# Patient Record
Sex: Male | Born: 1975 | Race: Black or African American | Hispanic: No | Marital: Single | Smoking: Current every day smoker
Health system: Southern US, Community
[De-identification: ages and names within clinical notes are randomized; demographics above are authoritative.]

## PROBLEM LIST (undated history)

## (undated) DIAGNOSIS — M109 Gout, unspecified: Secondary | ICD-10-CM

## (undated) DIAGNOSIS — E669 Obesity, unspecified: Secondary | ICD-10-CM

## (undated) DIAGNOSIS — I10 Essential (primary) hypertension: Secondary | ICD-10-CM

## (undated) DIAGNOSIS — E785 Hyperlipidemia, unspecified: Secondary | ICD-10-CM

## (undated) DIAGNOSIS — E119 Type 2 diabetes mellitus without complications: Secondary | ICD-10-CM

## (undated) DIAGNOSIS — J45909 Unspecified asthma, uncomplicated: Secondary | ICD-10-CM

## (undated) HISTORY — DX: Type 2 diabetes mellitus without complications: E11.9

## (undated) HISTORY — DX: Obesity, unspecified: E66.9

## (undated) HISTORY — DX: Essential (primary) hypertension: I10

## (undated) HISTORY — DX: Unspecified asthma, uncomplicated: J45.909

## (undated) HISTORY — PX: NO PAST SURGERIES: SHX2092

## (undated) HISTORY — DX: Hyperlipidemia, unspecified: E78.5

---

## 1998-10-07 ENCOUNTER — Emergency Department (HOSPITAL_COMMUNITY): Admission: EM | Admit: 1998-10-07 | Discharge: 1998-10-07 | Payer: Self-pay

## 2001-11-24 ENCOUNTER — Emergency Department (HOSPITAL_COMMUNITY): Admission: EM | Admit: 2001-11-24 | Discharge: 2001-11-24 | Payer: Self-pay | Admitting: Emergency Medicine

## 2001-11-24 ENCOUNTER — Encounter: Payer: Self-pay | Admitting: Emergency Medicine

## 2007-04-02 ENCOUNTER — Emergency Department (HOSPITAL_COMMUNITY): Admission: EM | Admit: 2007-04-02 | Discharge: 2007-04-02 | Payer: Self-pay | Admitting: Emergency Medicine

## 2007-04-02 IMAGING — CR DG HIP (WITH OR WITHOUT PELVIS) 2-3V*L*
3 series · 3 of 3 positions shown · non-contrast
Comparison: none

CLINICAL DATA: Left hip pain.  
 LEFT HIP ? 3 VIEW:

[t pelvis a.p.]
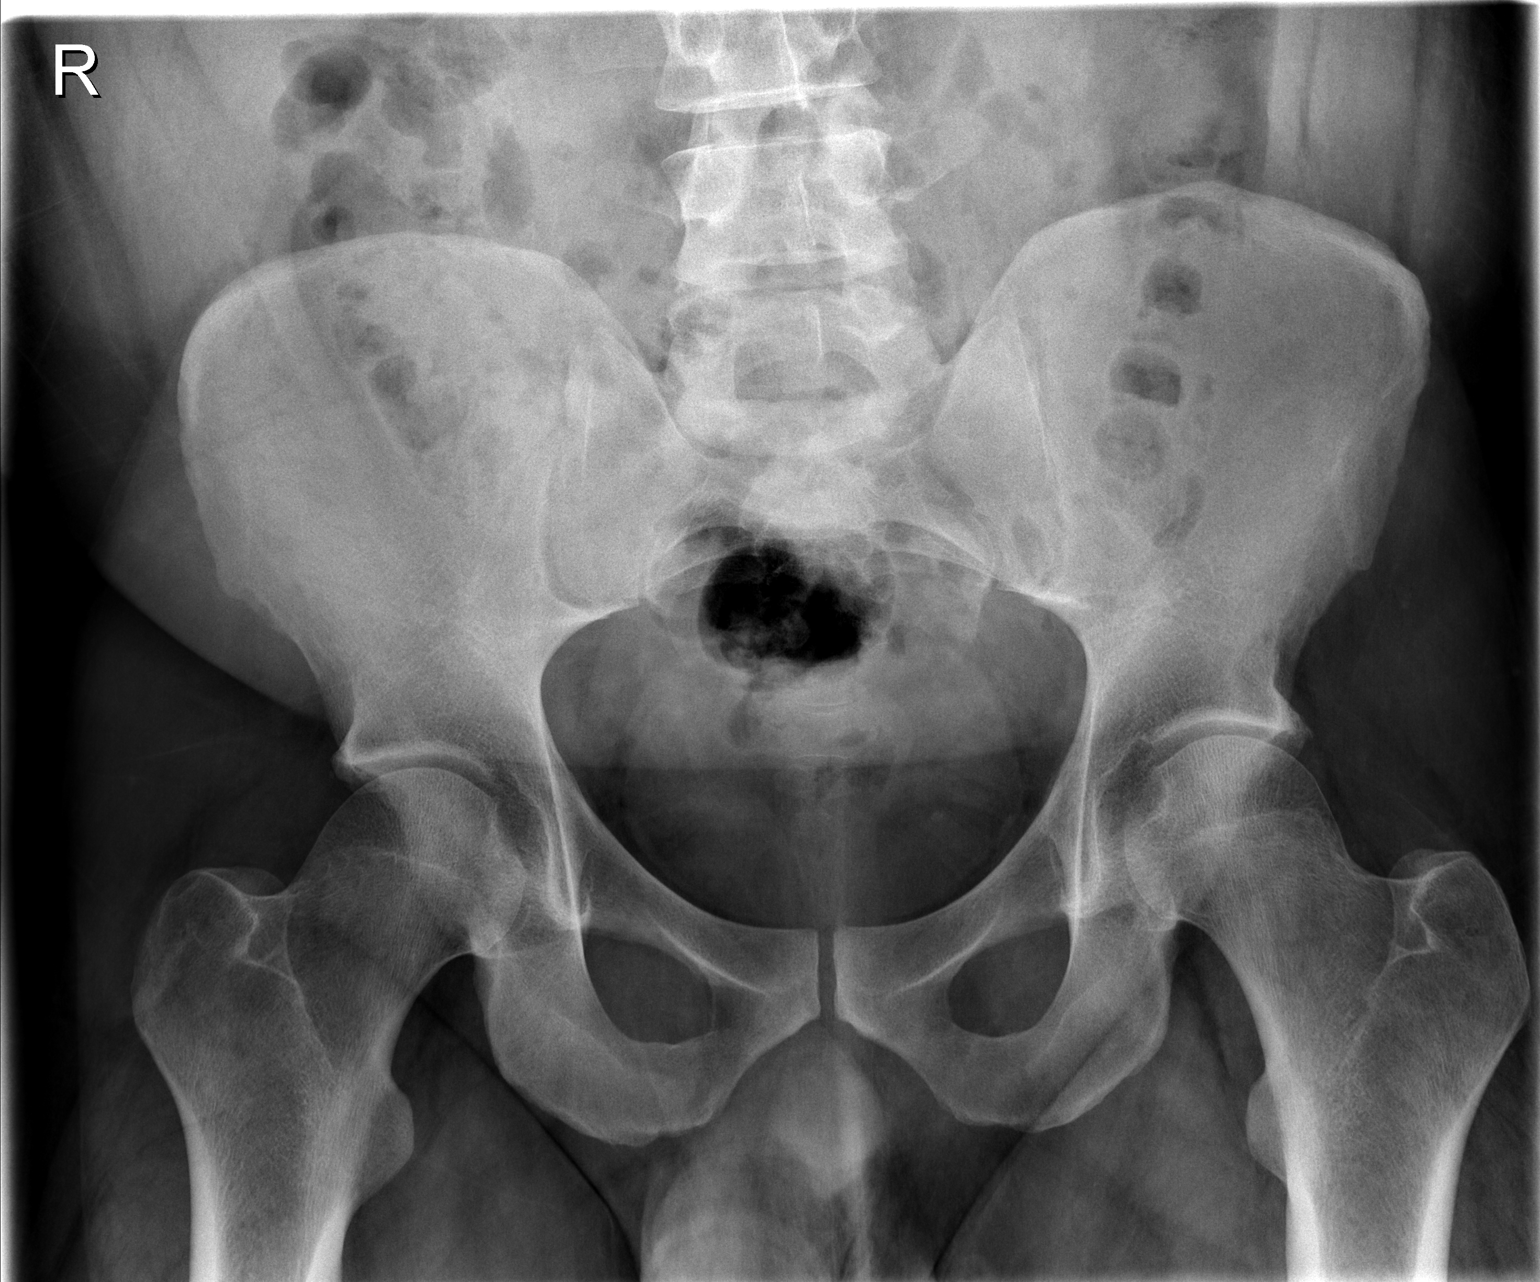

[t hip ap left]
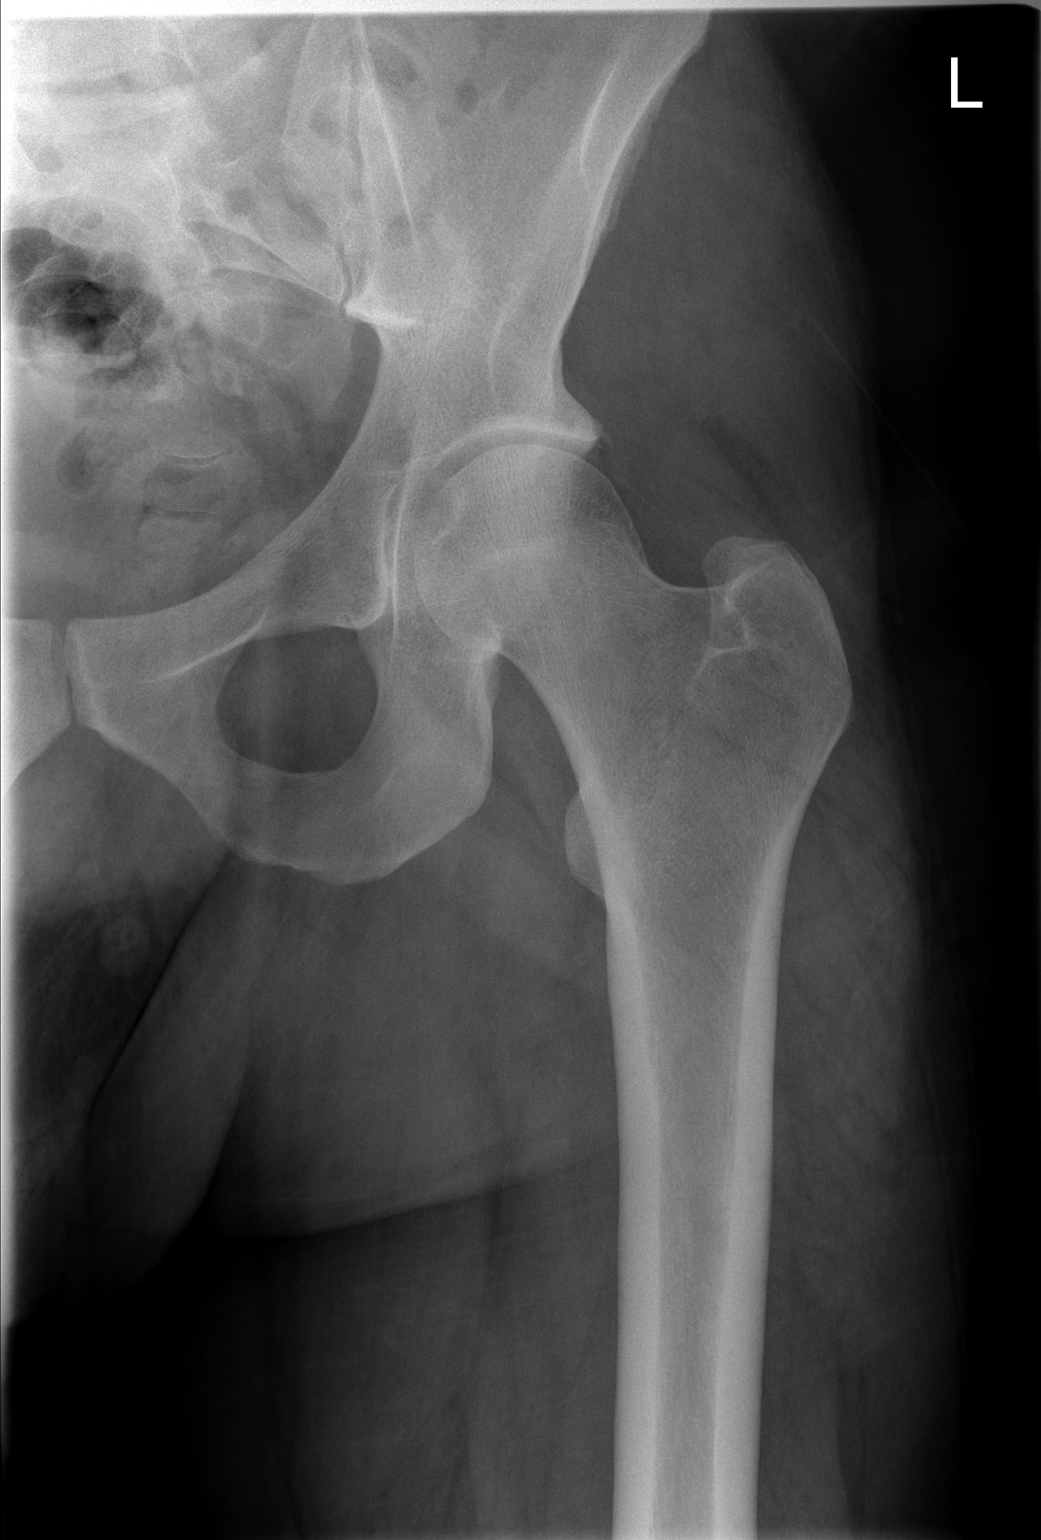

[t hip frog leg left]
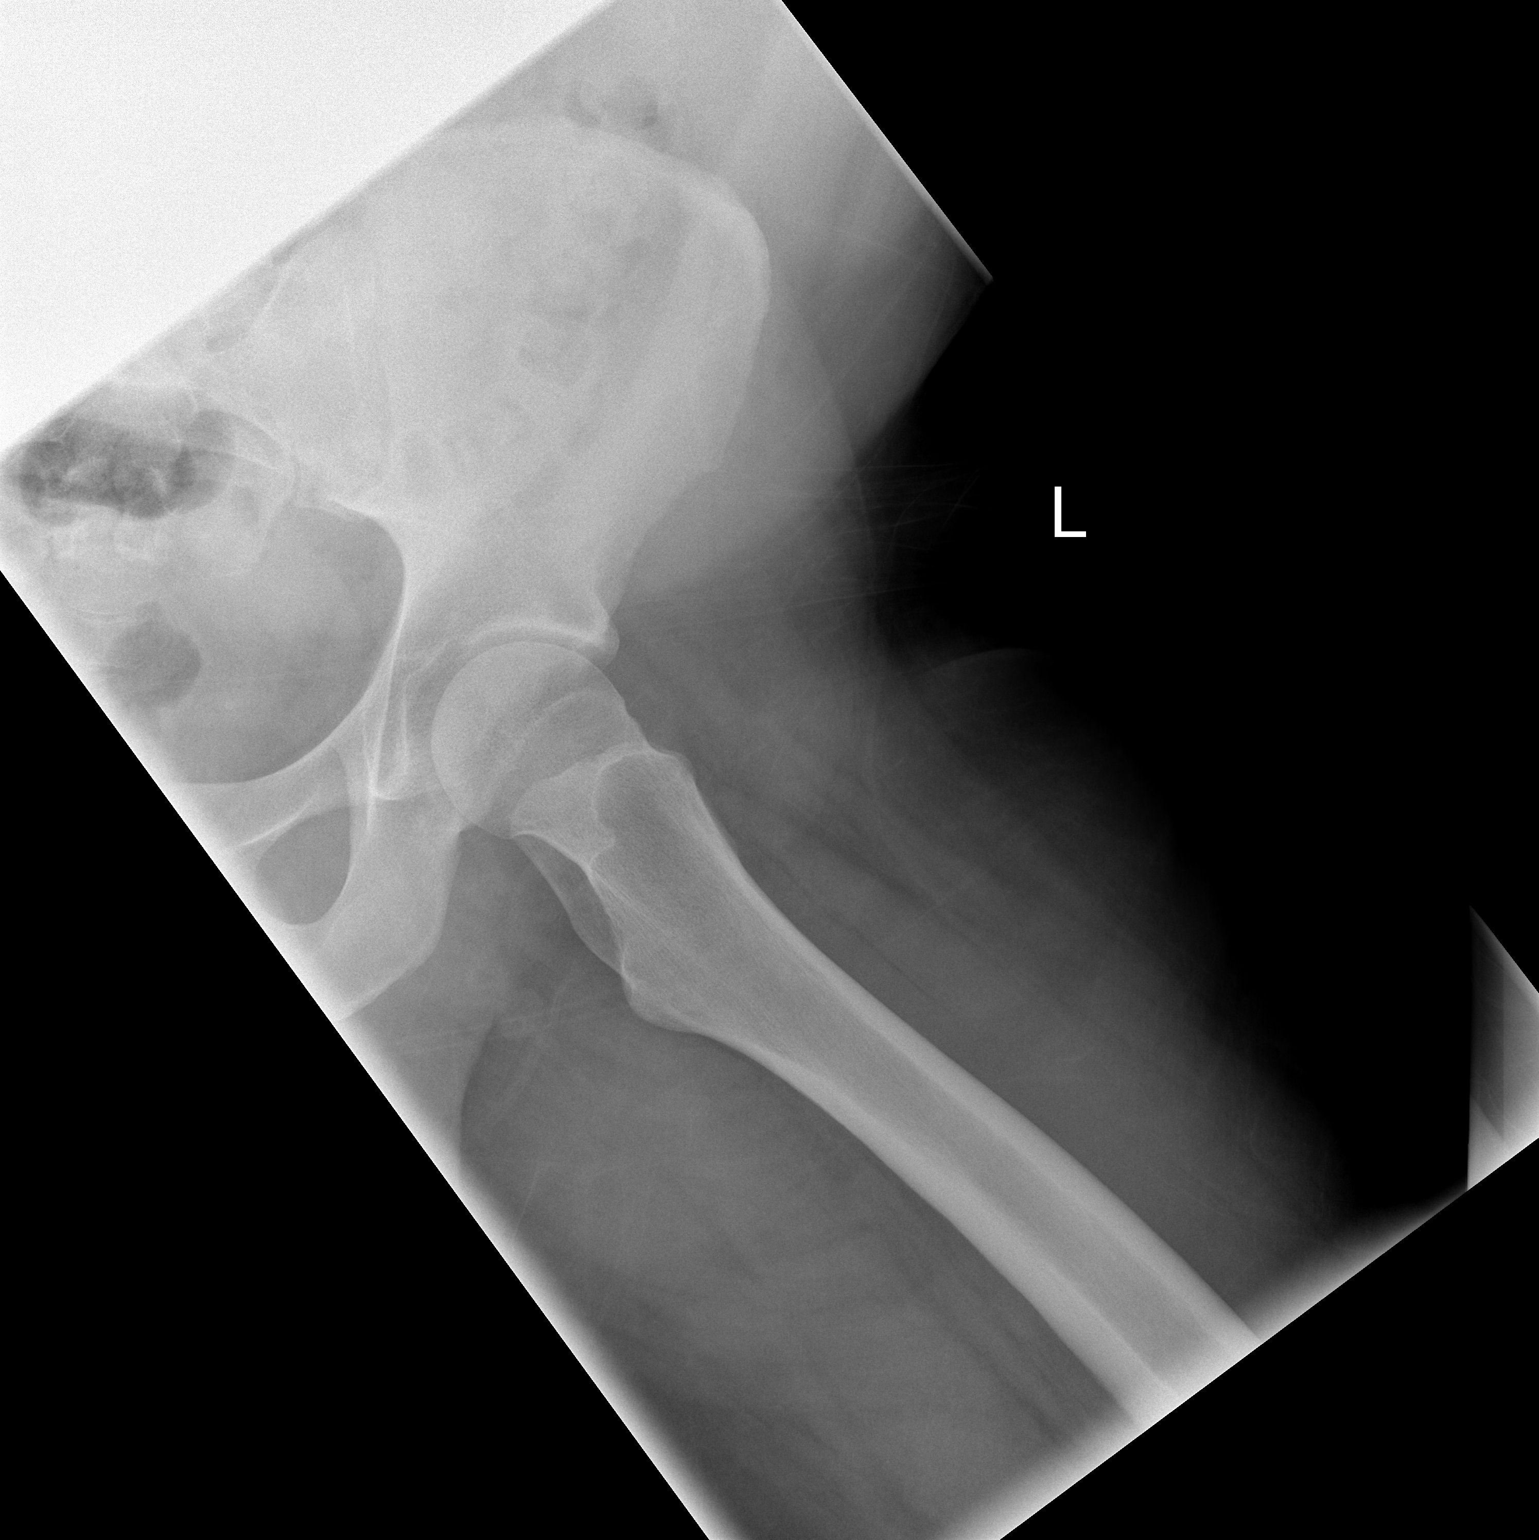

[3 of 3 positions shown; findings below may reference images not displayed]

FINDINGS: There is no evidence of hip fracture or dislocation.  There is no evidence of arthropathy or other focal bone abnormality involving the hip or pelvis.
IMPRESSION: Negative.

## 2008-01-16 ENCOUNTER — Emergency Department (HOSPITAL_COMMUNITY): Admission: EM | Admit: 2008-01-16 | Discharge: 2008-01-17 | Payer: Self-pay | Admitting: Emergency Medicine

## 2008-09-10 ENCOUNTER — Emergency Department (HOSPITAL_COMMUNITY): Admission: EM | Admit: 2008-09-10 | Discharge: 2008-09-10 | Payer: Self-pay | Admitting: Emergency Medicine

## 2008-09-10 IMAGING — CR DG CHEST 2V
2 series · 2 of 2 positions shown · non-contrast
Comparison: No priors

CLINICAL DATA: Chest pain

CHEST - 2 VIEW

[w chest pa]
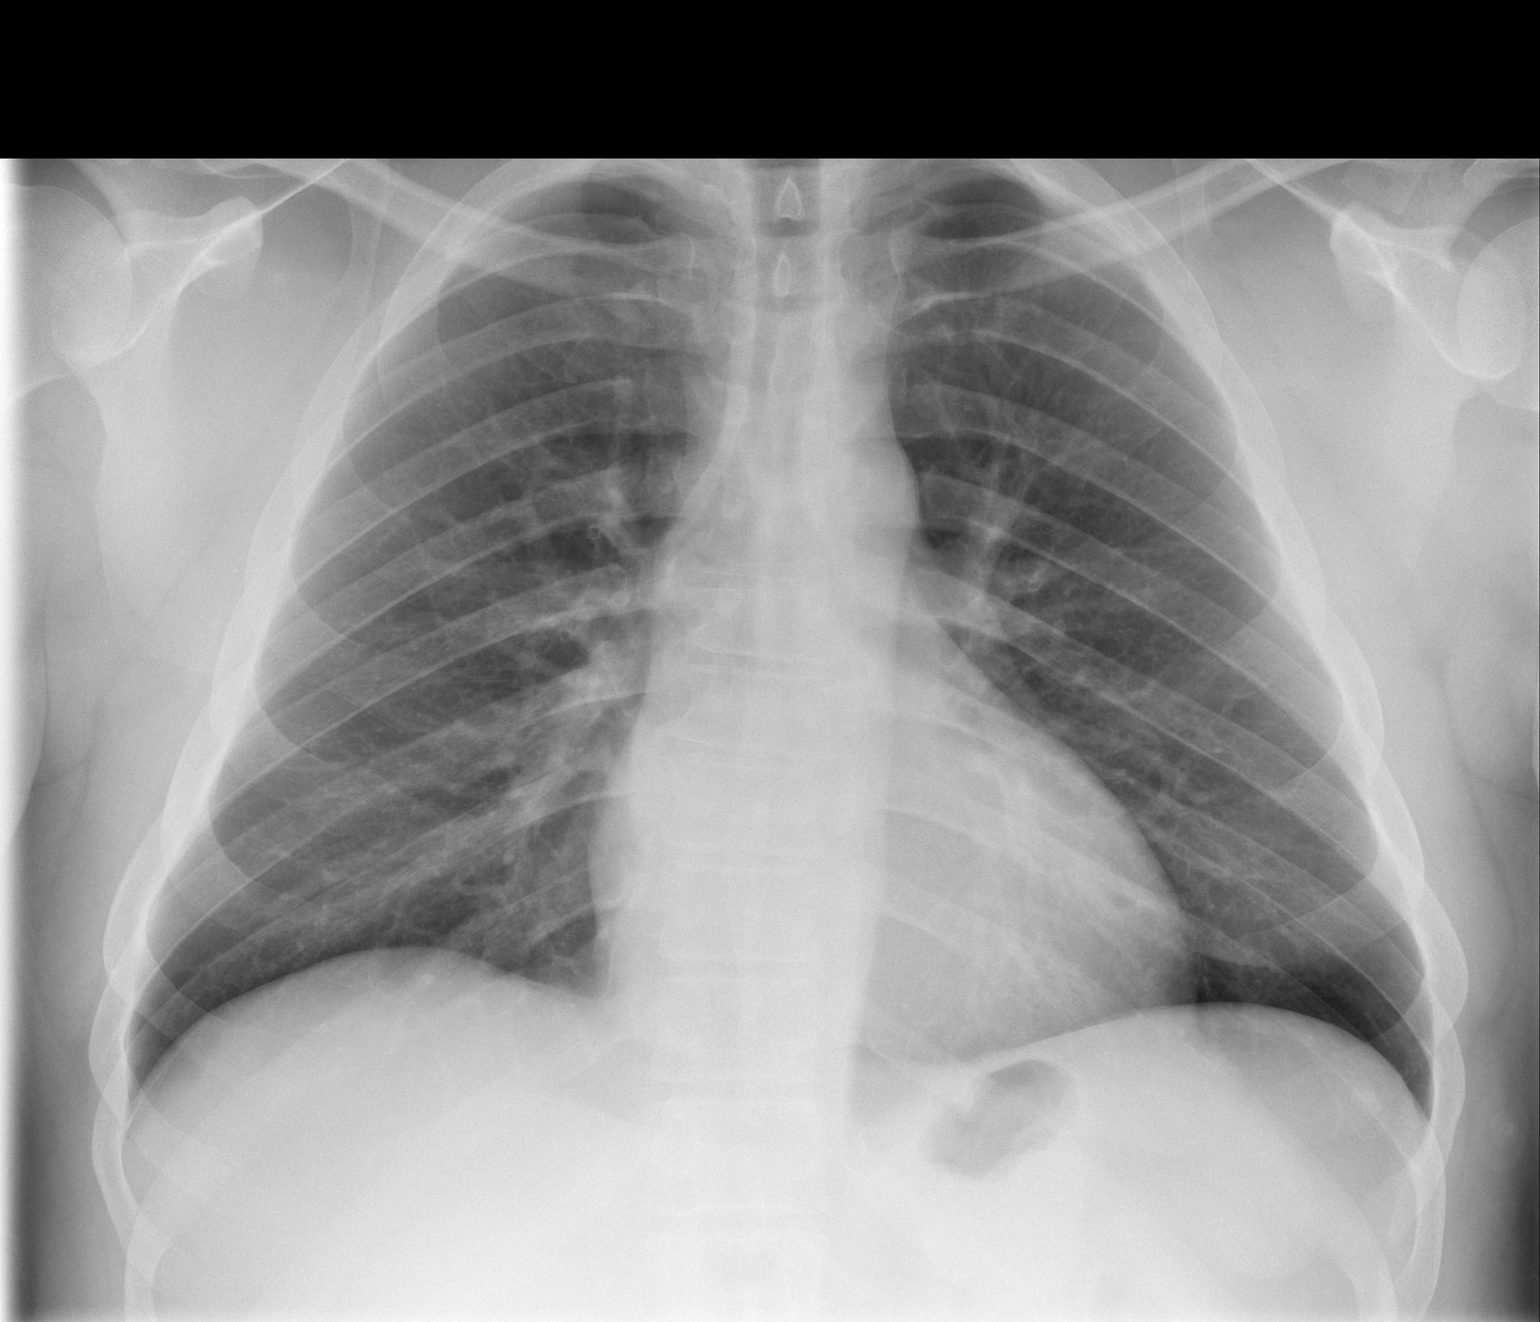

[w chest lat]
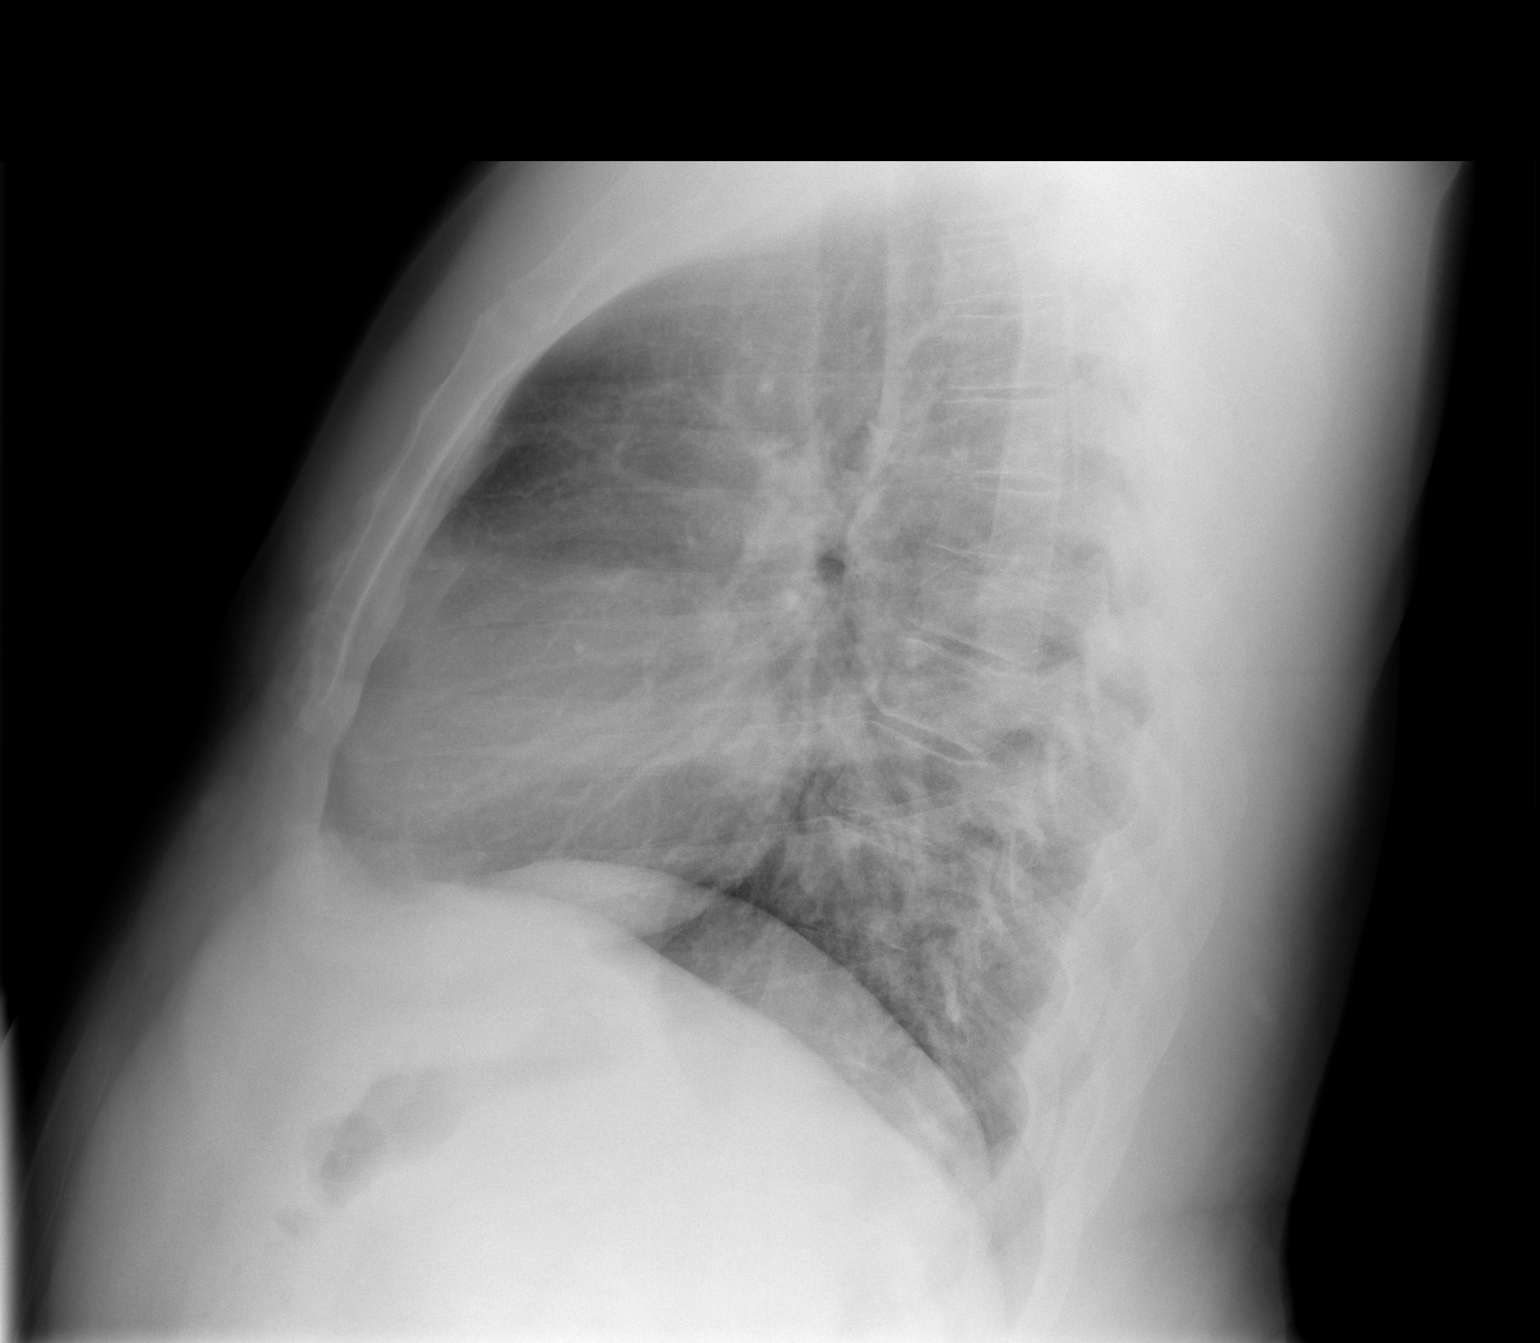

[2 of 2 positions shown; findings below may reference images not displayed]

FINDINGS: Heart and mediastinal contours normal.  Lungs clear.  No
pleural fluid.  Osseous structures and soft tissues unremarkable.
IMPRESSION: No acute or significant findings.

## 2010-12-20 LAB — POCT CARDIAC MARKERS: Myoglobin, poc: 105 ng/mL (ref 12–200)

## 2010-12-20 LAB — POCT I-STAT, CHEM 8
BUN: 14 mg/dL (ref 6–23)
Calcium, Ion: 1.16 mmol/L (ref 1.12–1.32)
HCT: 46 % (ref 39.0–52.0)
Hemoglobin: 15.6 g/dL (ref 13.0–17.0)
Sodium: 138 mEq/L (ref 135–145)
TCO2: 27 mmol/L (ref 0–100)

## 2010-12-20 LAB — DIFFERENTIAL
Basophils Absolute: 0.1 10*3/uL (ref 0.0–0.1)
Basophils Relative: 1 % (ref 0–1)
Eosinophils Absolute: 0.4 10*3/uL (ref 0.0–0.7)
Eosinophils Relative: 4 % (ref 0–5)
Lymphocytes Relative: 23 % (ref 12–46)
Lymphs Abs: 2.2 10*3/uL (ref 0.7–4.0)
Monocytes Absolute: 1.4 10*3/uL — ABNORMAL HIGH (ref 0.1–1.0)
Monocytes Relative: 14 % — ABNORMAL HIGH (ref 3–12)
Neutro Abs: 5.7 10*3/uL (ref 1.7–7.7)
Neutrophils Relative %: 59 % (ref 43–77)

## 2010-12-20 LAB — CBC
HCT: 43 % (ref 39.0–52.0)
Hemoglobin: 14.4 g/dL (ref 13.0–17.0)
MCHC: 33.5 g/dL (ref 30.0–36.0)
MCV: 89.1 fL (ref 78.0–100.0)
Platelets: 242 10*3/uL (ref 150–400)
RBC: 4.83 MIL/uL (ref 4.22–5.81)
RDW: 12.8 % (ref 11.5–15.5)
WBC: 9.7 10*3/uL (ref 4.0–10.5)

## 2011-01-13 ENCOUNTER — Ambulatory Visit
Admission: RE | Admit: 2011-01-13 | Discharge: 2011-01-13 | Disposition: A | Discharge status: Home / Self Care | Payer: Medicaid Other | Source: Ambulatory Visit | Attending: Family Medicine | Admitting: Family Medicine

## 2011-01-13 ENCOUNTER — Other Ambulatory Visit: Payer: Self-pay | Admitting: Family Medicine

## 2011-01-13 DIAGNOSIS — E669 Obesity, unspecified: Secondary | ICD-10-CM

## 2011-01-13 DIAGNOSIS — I10 Essential (primary) hypertension: Secondary | ICD-10-CM

## 2011-01-13 IMAGING — CR DG CHEST 2V
2 series · 2 of 2 positions shown · non-contrast
Comparison: Chest x-ray of [DATE]

CLINICAL DATA: Obesity, hypertension, smoking history

CHEST - 2 VIEW

[w chest pa]
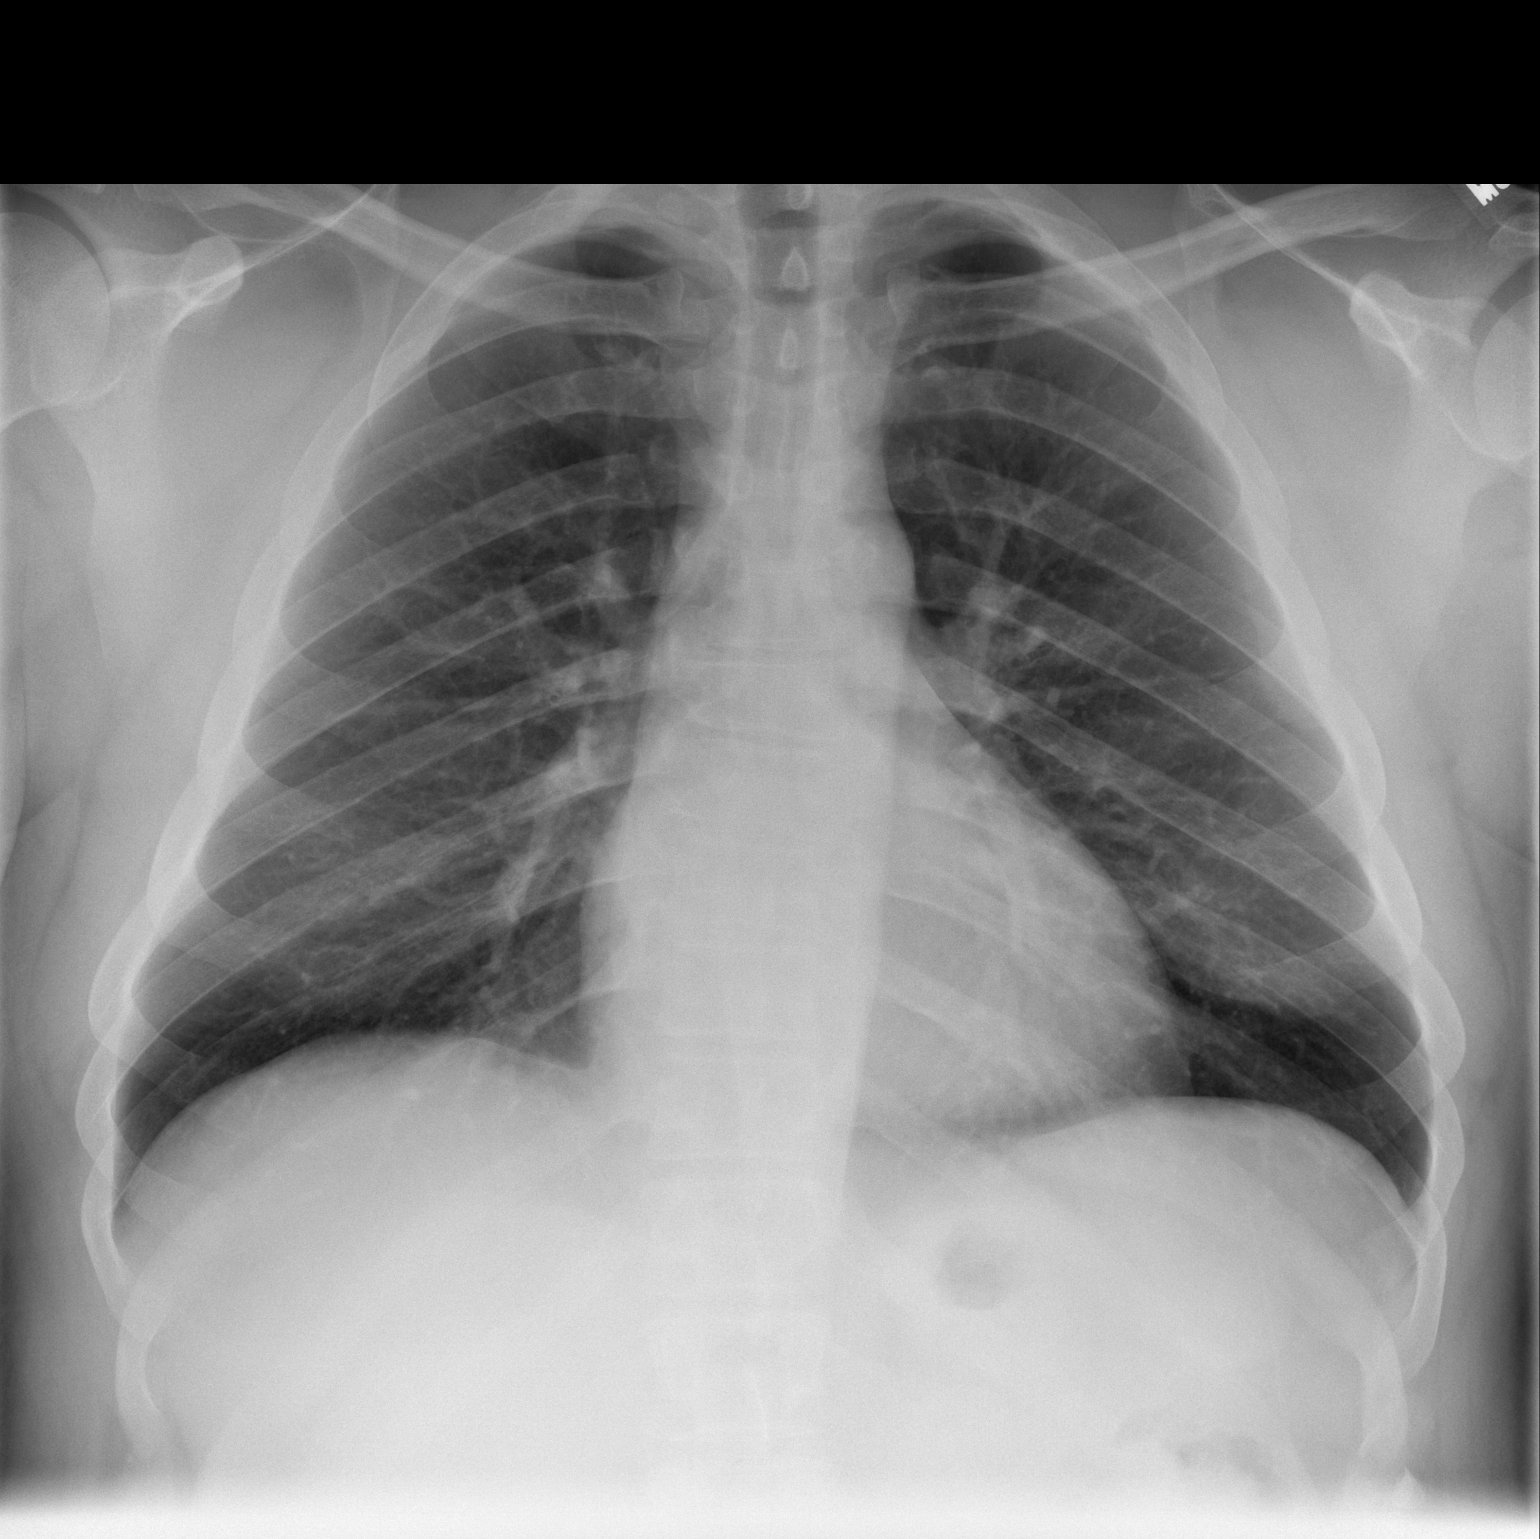

[w chest lat]
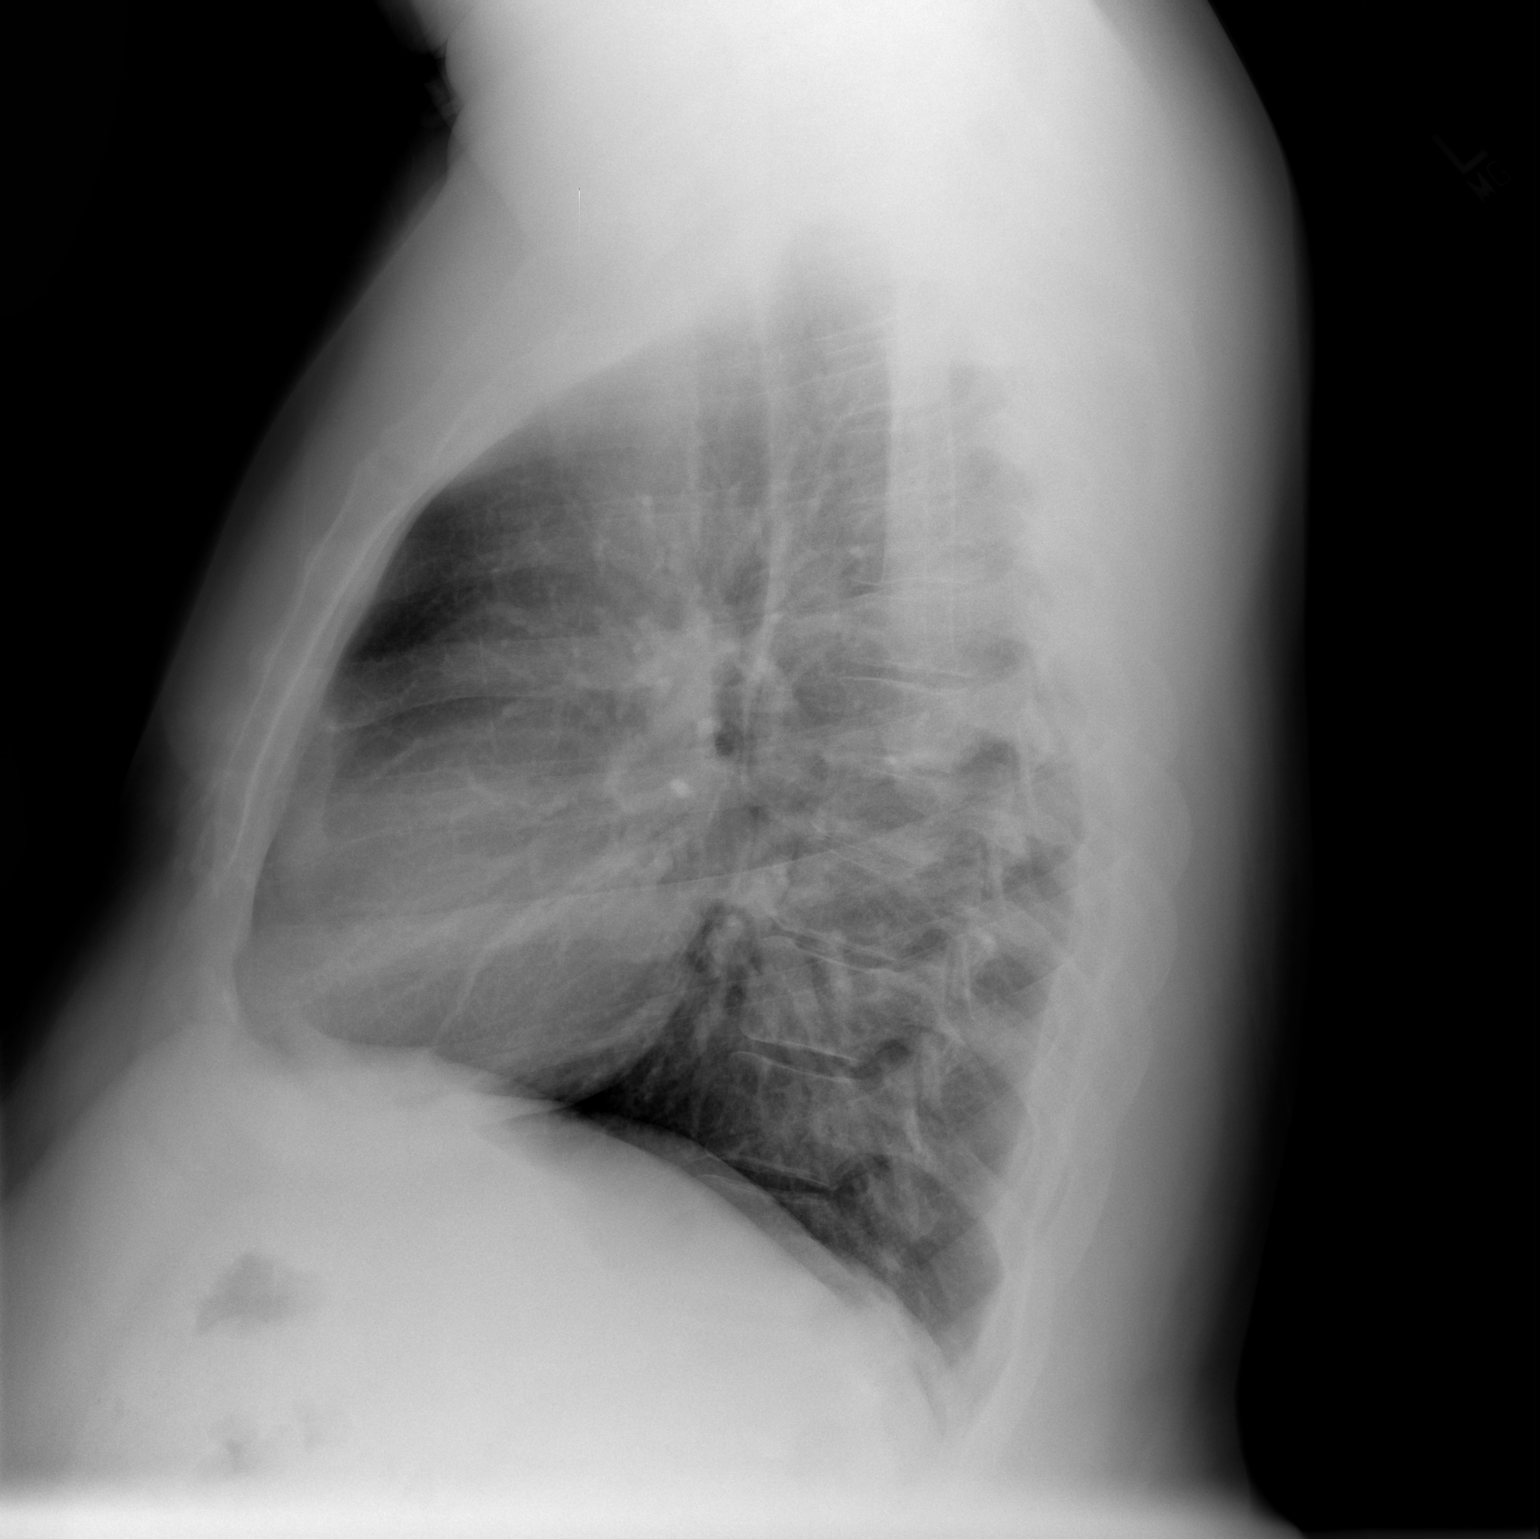

[2 of 2 positions shown; findings below may reference images not displayed]

FINDINGS: The lungs are clear.  Mediastinal contours are stable.
The heart is within upper limits of normal.  No bony abnormality is
seen.
IMPRESSION: Stable chest x-ray.  No active lung disease.

## 2011-05-29 ENCOUNTER — Emergency Department (HOSPITAL_COMMUNITY): Payer: Medicaid Other

## 2011-05-29 ENCOUNTER — Emergency Department (HOSPITAL_COMMUNITY)
Admission: EM | Admit: 2011-05-29 | Discharge: 2011-05-30 | Disposition: A | Discharge status: Home / Self Care | Payer: Medicaid Other | Attending: Emergency Medicine | Admitting: Emergency Medicine

## 2011-05-29 DIAGNOSIS — M2140 Flat foot [pes planus] (acquired), unspecified foot: Secondary | ICD-10-CM | POA: Insufficient documentation

## 2011-05-29 DIAGNOSIS — R609 Edema, unspecified: Secondary | ICD-10-CM | POA: Insufficient documentation

## 2011-05-29 DIAGNOSIS — X58XXXA Exposure to other specified factors, initial encounter: Secondary | ICD-10-CM | POA: Insufficient documentation

## 2011-05-29 DIAGNOSIS — E7800 Pure hypercholesterolemia, unspecified: Secondary | ICD-10-CM | POA: Insufficient documentation

## 2011-05-29 DIAGNOSIS — M7989 Other specified soft tissue disorders: Secondary | ICD-10-CM | POA: Insufficient documentation

## 2011-05-29 DIAGNOSIS — S90129A Contusion of unspecified lesser toe(s) without damage to nail, initial encounter: Secondary | ICD-10-CM | POA: Insufficient documentation

## 2011-05-29 DIAGNOSIS — E119 Type 2 diabetes mellitus without complications: Secondary | ICD-10-CM | POA: Insufficient documentation

## 2011-05-29 DIAGNOSIS — I10 Essential (primary) hypertension: Secondary | ICD-10-CM | POA: Insufficient documentation

## 2011-05-29 LAB — DIFFERENTIAL
Basophils Absolute: 0 K/uL (ref 0.0–0.1)
Basophils Relative: 0 % (ref 0–1)
Eosinophils Absolute: 0.1 K/uL (ref 0.0–0.7)
Eosinophils Relative: 1 % (ref 0–5)
Lymphocytes Relative: 13 % (ref 12–46)
Lymphs Abs: 1.7 10*3/uL (ref 0.7–4.0)
Monocytes Absolute: 1.5 10*3/uL — ABNORMAL HIGH (ref 0.1–1.0)
Monocytes Relative: 11 % (ref 3–12)
Neutro Abs: 9.9 10*3/uL — ABNORMAL HIGH (ref 1.7–7.7)
Neutrophils Relative %: 74 % (ref 43–77)

## 2011-05-29 LAB — CBC
HCT: 43.8 % (ref 39.0–52.0)
Hemoglobin: 14.9 g/dL (ref 13.0–17.0)
MCH: 29.7 pg (ref 26.0–34.0)
MCHC: 34 g/dL (ref 30.0–36.0)
MCV: 87.3 fL (ref 78.0–100.0)
Platelets: 342 K/uL (ref 150–400)
RBC: 5.02 MIL/uL (ref 4.22–5.81)
RDW: 14 % (ref 11.5–15.5)
WBC: 13.3 K/uL — ABNORMAL HIGH (ref 4.0–10.5)

## 2011-05-29 LAB — COMPREHENSIVE METABOLIC PANEL
ALT: 29 U/L (ref 0–53)
BUN: 16 mg/dL (ref 6–23)
CO2: 24 mEq/L (ref 19–32)
Calcium: 9.6 mg/dL (ref 8.4–10.5)
GFR calc Af Amer: >60 mL/min (ref >60)
GFR calc non Af Amer: >60 mL/min (ref >60)
Glucose, Bld: 130 mg/dL — ABNORMAL HIGH (ref 70–99)
Sodium: 138 mEq/L (ref 135–145)
Total Protein: 8 g/dL (ref 6.0–8.3)

## 2011-05-29 LAB — COMPREHENSIVE METABOLIC PANEL WITH GFR
AST: 18 U/L (ref 0–37)
Albumin: 3.8 g/dL (ref 3.5–5.2)
Alkaline Phosphatase: 64 U/L (ref 39–117)
Chloride: 100 meq/L (ref 96–112)
Creatinine, Ser: 0.9 mg/dL (ref 0.50–1.35)
Potassium: 3.5 meq/L (ref 3.5–5.1)
Total Bilirubin: 0.2 mg/dL — ABNORMAL LOW (ref 0.3–1.2)

## 2011-05-29 LAB — URIC ACID: Uric Acid, Serum: 7.7 mg/dL (ref 4.0–7.8)

## 2011-05-29 LAB — PROTIME-INR
INR: 0.88 (ref 0.00–1.49)
Prothrombin Time: 12.1 seconds (ref 11.6–15.2)

## 2011-05-29 LAB — APTT: aPTT: 31 s (ref 24–37)

## 2011-05-29 IMAGING — CR DG FOOT COMPLETE 3+V*L*
3 series · 3 of 3 positions shown · non-contrast
Comparison: None.

CLINICAL DATA: Left foot pain and swelling for 2 days.  History of
diabetes and foot infections.

LEFT FOOT - COMPLETE 3+ VIEW

[t foot ap left]
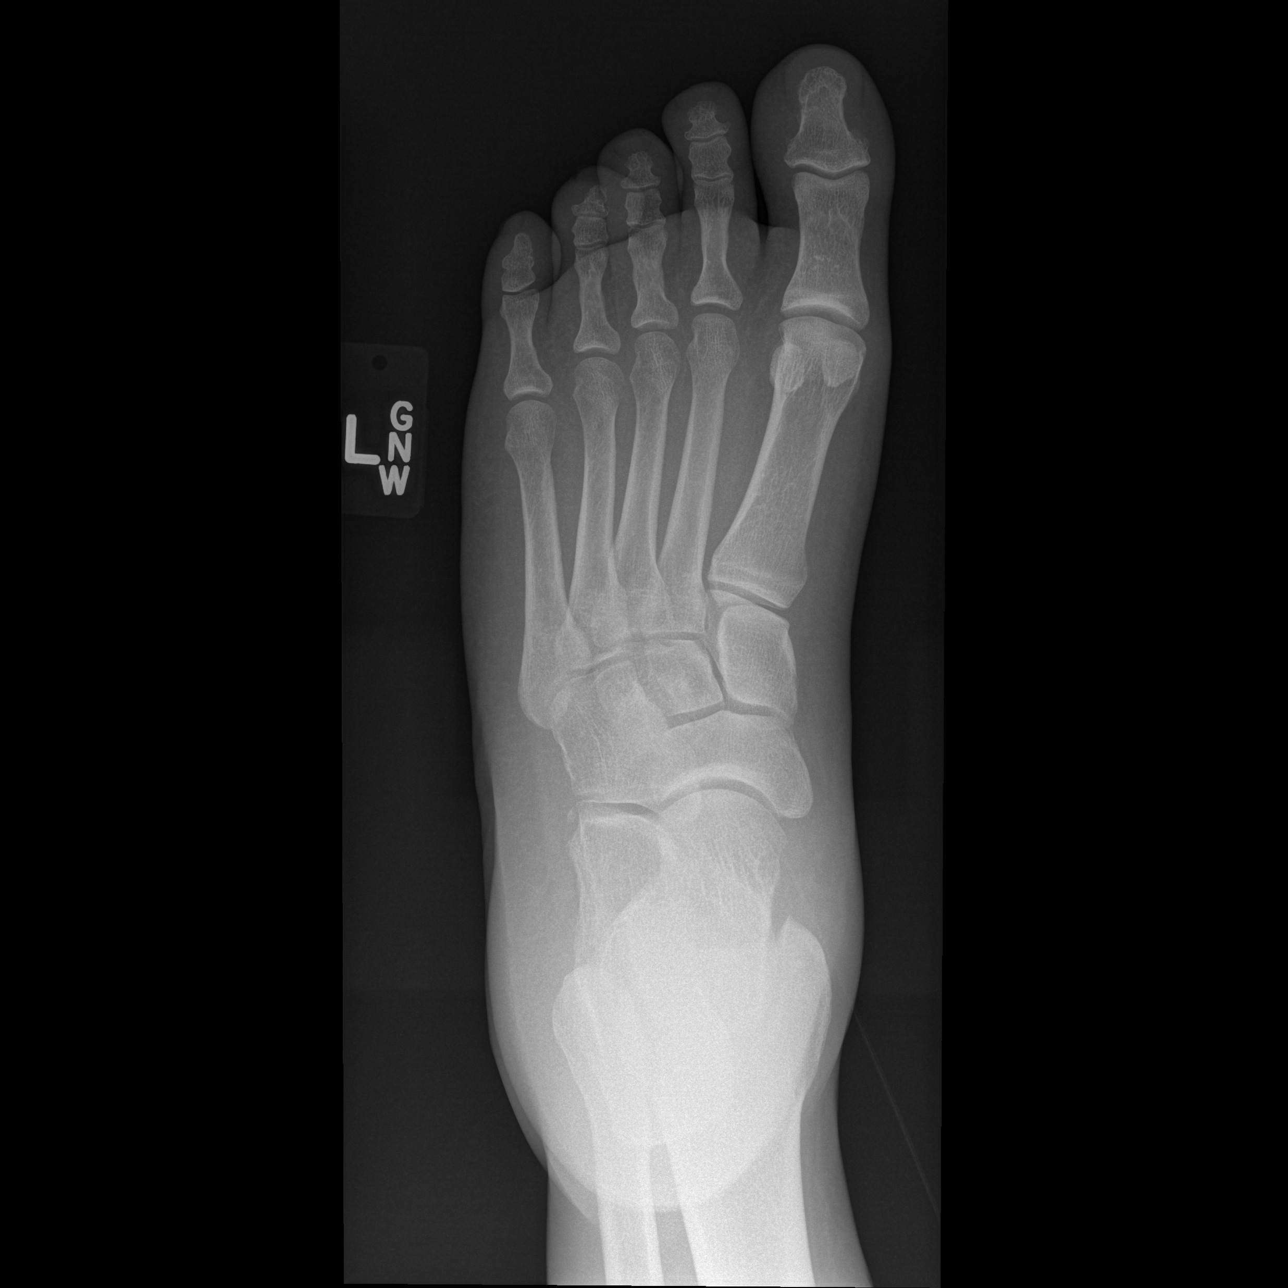

[t foot oblique left *]
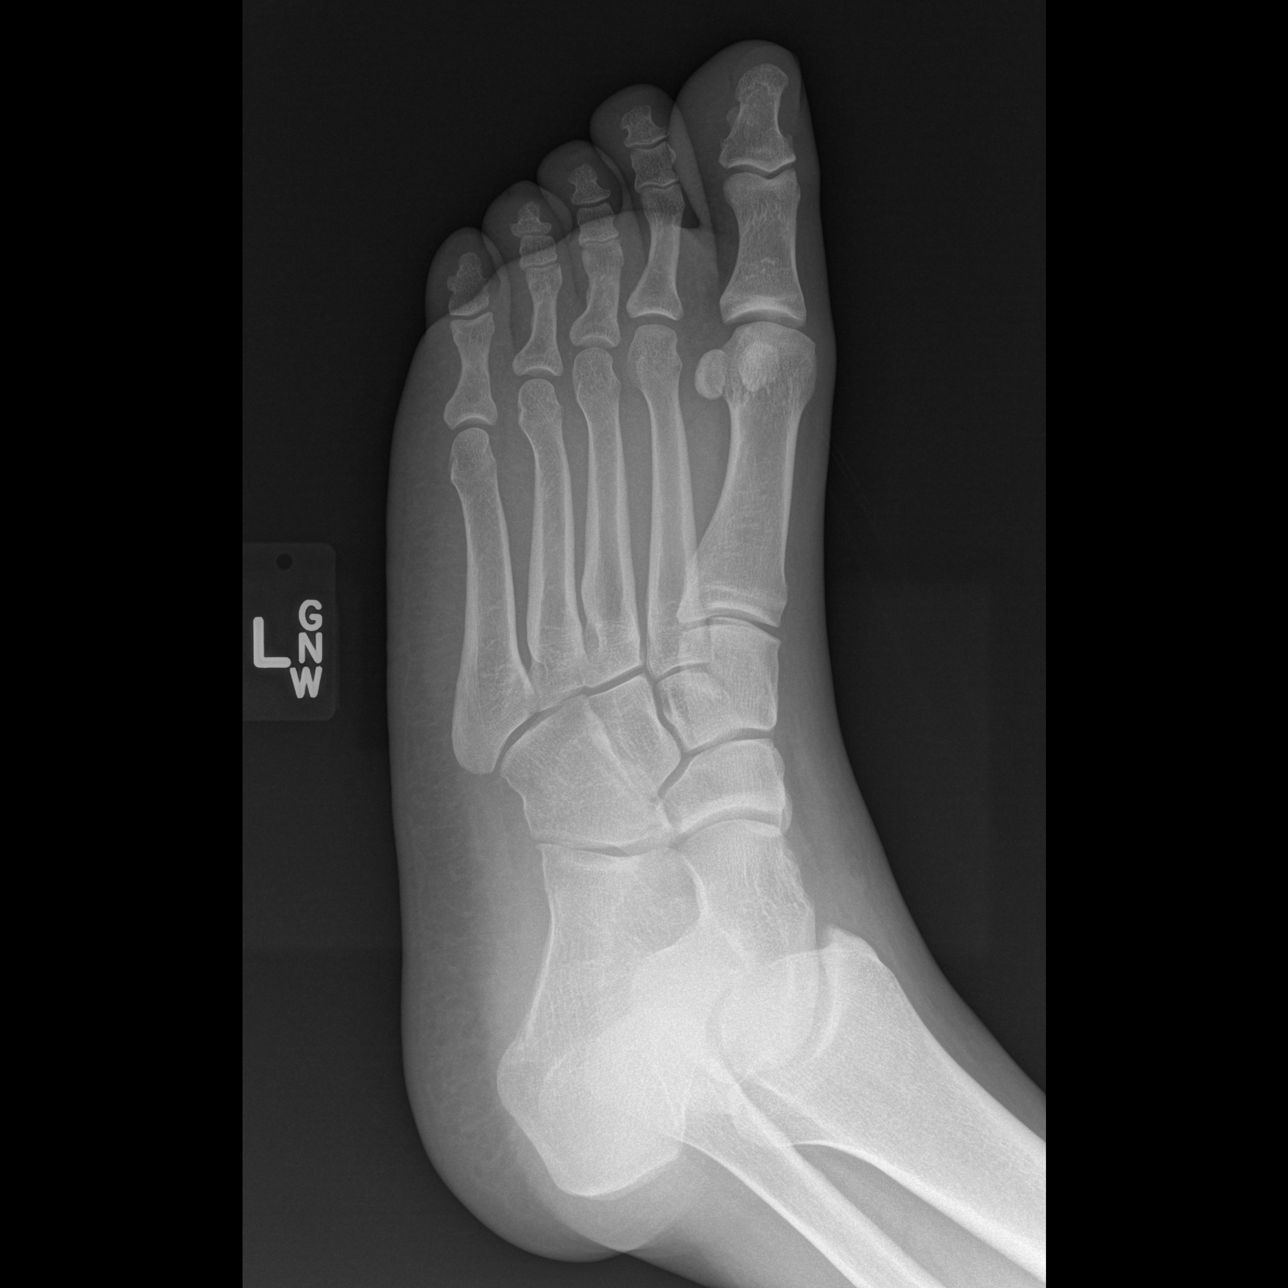

[t foot lat left]
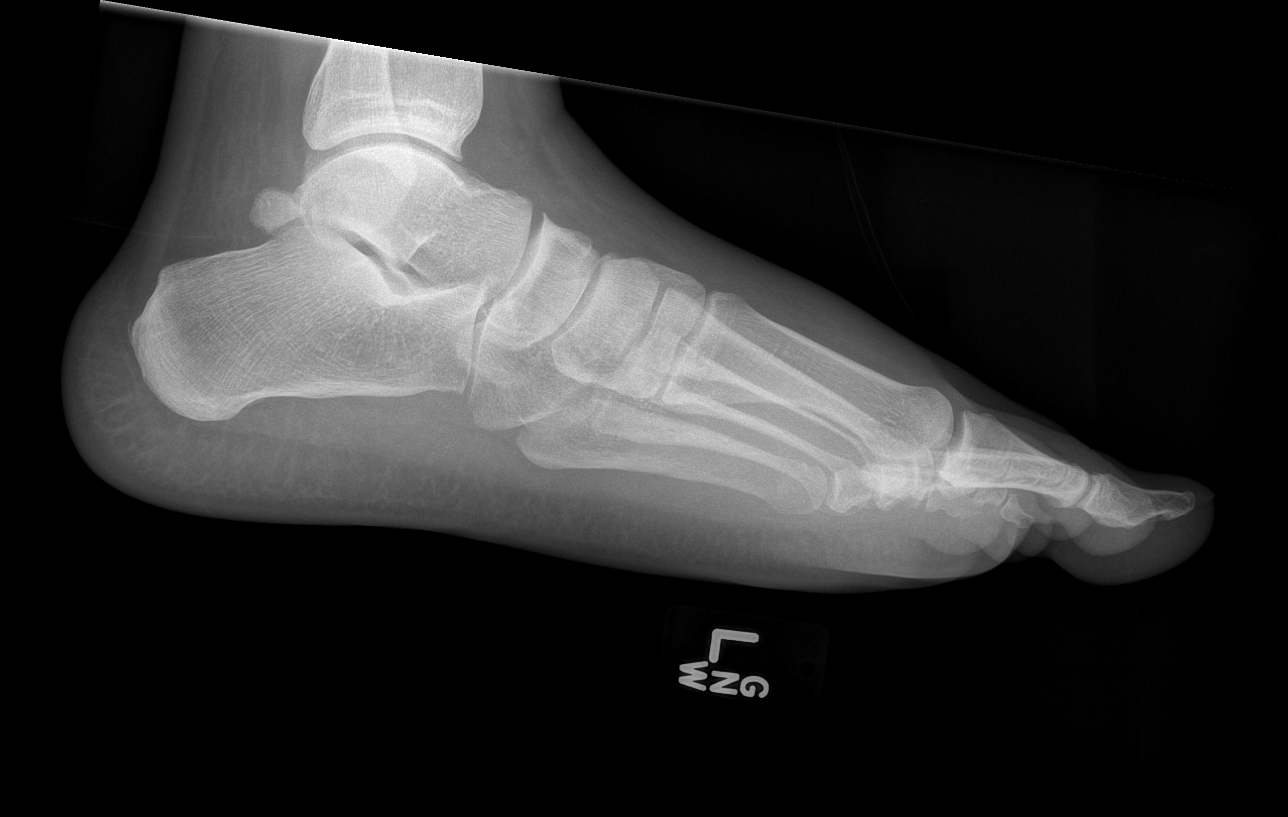

[3 of 3 positions shown; findings below may reference images not displayed]

FINDINGS: There is no evidence of fracture or dislocation.  No
osseous erosions are seen to suggest osteomyelitis.  The joint
spaces are preserved.  There is no evidence of talar subluxation;
the subtalar joint is unremarkable in appearance.  An os trigonum
is seen.  A small os peroneum is noted.  Apparent pes planus is
noted.

Mild diffuse soft tissue swelling is seen.
IMPRESSION: 1.  No evidence of fracture or dislocation.  No osseous erosions
seen to suggest osteomyelitis.
2.  Apparent pes planus noted.
3.  Os trigonum noted.
4.  Small os peroneum seen.

## 2012-11-15 ENCOUNTER — Encounter: Payer: Self-pay | Admitting: Dietician

## 2012-11-15 ENCOUNTER — Encounter: Payer: Medicaid Other | Attending: Internal Medicine | Admitting: Dietician

## 2012-11-15 VITALS — Ht 69.0 in | Wt 282.0 lb

## 2012-11-15 DIAGNOSIS — Z713 Dietary counseling and surveillance: Secondary | ICD-10-CM | POA: Insufficient documentation

## 2012-11-15 DIAGNOSIS — E119 Type 2 diabetes mellitus without complications: Secondary | ICD-10-CM | POA: Insufficient documentation

## 2012-11-15 DIAGNOSIS — E669 Obesity, unspecified: Secondary | ICD-10-CM | POA: Insufficient documentation

## 2012-11-15 NOTE — Patient Instructions (Addendum)
   Get busy jogging or power walking for 150 minutes each week.  Try to lose 14-28 lb over the next 4 weeks.  Take your medications.  Check you blood sugar after the biggest meal of the day (2 hours after the first bite of the meal) Goal is 80-160mg   Try to start/aim for eating 3 meals or two meals and a large snack. For controlling metabolism and blood glucose.

## 2012-11-15 NOTE — Progress Notes (Signed)
Diabetes Self-Management Education  Visit Number: First/Initial  11/15/2012 [name], identified by name and date of birth, is a 37 y.o. male with Diabetes Type: Type 2.  Other people present during visit:  Other (comment)   ASSESSMENT  Patient Concerns:   (no one)  Height 5\' 9"  (1.753 m), weight 282 lb (127.914 kg). Body mass index is 41.63 kg/(m^2).  Lab Results: No results found for this basename: LDLCALC, HGBA1C     No family history on file. History  Substance Use Topics  . Smoking status: Current Every Day Smoker -- 0.50 packs/day    Types: Cigarettes  . Smokeless tobacco: Never Used  . Alcohol Use: Not on file    Support Systems:  Friends;Lives Alone Special Needs:  None  Prior DM Education:   Daily Foot Exams: Yes Patient Belief / Attitude about Diabetes:  Diabetes can be controlled  Assessment comments: Comes today for first visit.  Notes that he has a number of issues eating late at night.  He C/O having the symptoms for hypoglycemia and having had multiple episodes of the shaky, nervous, hungry.  But has a history of skipping meals.  He also has S/S for hyperglycemia, with the increased thirst and drinking water all night.  Has been very tired lately. Not consistently checking his feet, but does have a crack on his heel.  We reviewed the process and the need to check daily and the steps for good foot care.        Diet Recall:   Breakfast:  Drinks water all night.  None  Lunch Time:  12:00  Couple of oranges and maybe an apple.  Drinks water.  Dinner:  7-8:00 PM [employer] had chicken gizzards and pot pie.  But ate half of the gizzards.  OR Roast, mash potatoes, succotash .  Snack 11-12:00 Ate the chicken gizzards with a couple of oranges.  Eats fried foods only one time per week.  Usually cooks his own food  Individualized Plan for Diabetes Self-Management Training:  Get busy jogging or power walking for 150 minutes each week.  Try to lose 14-28 lb over  the next 4 weeks.  Take your medications.  Check you blood sugar after the biggest meal of the day (2 hours after the first bite of the meal) Goal is 80-160mg   Try to start/aim for eating 3 meals or two meals and a large snack. For controlling metabolism and blood glucose.    Education Topics Reviewed with Patient Today:  Topic Points Discussed  Disease State  Review of what diabetes is.  Nutrition Management  Review of the carb restricted diet, label reading, carb counting  Physical Activity and Exercise  Review of the advantages of exercise for lowering blood glucose  Medications  Review of the use of medications for lowering glucose and when to take them  Monitoring  Review of glucose monitoring and the goals for fasting and 2 hrs post-meal  Acute Complications  Review of S/S of low blood glucose and the treatment for low blood glucose.  Chronic Complications    Psychosocial Adjustment    Goal Setting    Preconception Care (if applicable)      PATIENTS GOALS   Learning Objective(s):     Goal The patient agrees to:  Nutrition  Read labels, try to eat regular meals, try to count carb and to lose more weight.  Physical Activity  To participate in regular exercise activity at least 150 minutes per week.  Medications  Continue to take his medications  Monitoring  Monitor fasting and 2 hours after the 1st bite of the meal blood glucoe levels.  Problem Solving    Reducing Risk    Health Coping      Outcomes  Expected Outcomes:     Self-Care Barriers:    Lives alone and has issues setting a schedule for himself to follow.  Education material provided:  1. Living Well with Diabetes 2. Yellow Card with Diet Prescription 3. Novo Nordisk Carb Counting Guide 4 Menus for the 45 gm and 60 gm Carb diets. 5. Snack list.  If problems or questions, patient to contact team via:  Phone  Time in: 1515     Time out: 1430  Future DSME appointment: -   To cal and set-up appointment  for 8-12 weeks.   [facility] 11/15/2012 3:39 PM

## 2012-12-03 ENCOUNTER — Encounter: Payer: Self-pay | Admitting: Dietician

## 2013-01-01 ENCOUNTER — Encounter: Payer: Self-pay | Admitting: Dietician

## 2013-01-01 ENCOUNTER — Encounter: Payer: Medicaid Other | Attending: Internal Medicine | Admitting: Dietician

## 2013-01-01 VITALS — Ht 69.0 in | Wt 271.9 lb

## 2013-01-01 DIAGNOSIS — E119 Type 2 diabetes mellitus without complications: Secondary | ICD-10-CM | POA: Insufficient documentation

## 2013-01-01 DIAGNOSIS — E669 Obesity, unspecified: Secondary | ICD-10-CM | POA: Insufficient documentation

## 2013-01-01 DIAGNOSIS — Z713 Dietary counseling and surveillance: Secondary | ICD-10-CM | POA: Insufficient documentation

## 2013-01-01 NOTE — Patient Instructions (Addendum)
   Check 2 hours after 1 st bite of largest meal of the day.  One time per week.   Keep up the exercise.  Keep up with the leaner meats and limiting the fried.  Continue the smaller portions.  Try to have a starch at a meal.    Continue the 3 meals per day.

## 2013-01-01 NOTE — Progress Notes (Signed)
  Medical Nutrition Therapy:  Appt start time: 1500 end time:  [id number].  Assessment:  Primary concerns today: Came today to let you know that I did what I said I would do.  Has lost 11 lb since last visit.  Has decreased portions and has been eating 3 meals per day and eating less at night.  Started to exercise.   At recent MD visit was told that he was not due any blood work  BLOOD GLUCOSE:  AM: 109, 111  MEDICATIONS: Med review completed  DIETARY INTAKE:Not eating as much.  Has cut portions.  Eating 3 meals and not snacking at night.  Has not had anything fried to eat over the last month.  24-hr recall:  B ( AM): Hot dog and boiled egg.  (no bread0  Snk ( AM) :none  L ( PM): left over steak with some greens.  If has fruit will eat a couple of servings of fruit for lunch  Snk ( PM): none D ( PM): steak , green peas,   Snk ( PM): none Beverages: water  Recent physical activity: 30-40 jumping jacks daily and 10 pushups.  No walking.  Does some curls when just sitting and watching TV.  Estimated energy needs: HT:  69 in  WT: 271.9 lb   BMI: 40.2 lb  Adj WT: 202 lb  (92 kg) 1700-1800 calories 165-175 g carbohydrates 150-155 g protein 50 g fat  Progress Towards Goal(s):  Some progress.   Nutritional Diagnosis:  Cassel-2.1 Inpaired nutrition utilization As related to glucose.  As evidenced by Diagnosis of type 2 diabetes.    Intervention:  Nutrition Review of the interventions to continue weight loss.    Monitoring/Evaluation:  Dietary intake, exercise, blood glucose, and body weight in two months with Bev Paddock.[name]

## 2013-01-17 ENCOUNTER — Emergency Department (HOSPITAL_COMMUNITY): Payer: Medicaid Other

## 2013-01-17 ENCOUNTER — Emergency Department (HOSPITAL_COMMUNITY)
Admission: EM | Admit: 2013-01-17 | Discharge: 2013-01-17 | Disposition: A | Discharge status: Home / Self Care | Payer: Medicaid Other | Attending: Emergency Medicine | Admitting: Emergency Medicine

## 2013-01-17 ENCOUNTER — Encounter (HOSPITAL_COMMUNITY): Payer: Self-pay | Admitting: Emergency Medicine

## 2013-01-17 DIAGNOSIS — S6980XA Other specified injuries of unspecified wrist, hand and finger(s), initial encounter: Secondary | ICD-10-CM | POA: Insufficient documentation

## 2013-01-17 DIAGNOSIS — F17200 Nicotine dependence, unspecified, uncomplicated: Secondary | ICD-10-CM | POA: Insufficient documentation

## 2013-01-17 DIAGNOSIS — Y929 Unspecified place or not applicable: Secondary | ICD-10-CM | POA: Insufficient documentation

## 2013-01-17 DIAGNOSIS — S6990XA Unspecified injury of unspecified wrist, hand and finger(s), initial encounter: Secondary | ICD-10-CM | POA: Insufficient documentation

## 2013-01-17 DIAGNOSIS — S6991XA Unspecified injury of right wrist, hand and finger(s), initial encounter: Secondary | ICD-10-CM

## 2013-01-17 DIAGNOSIS — S6992XA Unspecified injury of left wrist, hand and finger(s), initial encounter: Secondary | ICD-10-CM

## 2013-01-17 DIAGNOSIS — X500XXA Overexertion from strenuous movement or load, initial encounter: Secondary | ICD-10-CM | POA: Insufficient documentation

## 2013-01-17 DIAGNOSIS — I10 Essential (primary) hypertension: Secondary | ICD-10-CM | POA: Insufficient documentation

## 2013-01-17 DIAGNOSIS — J45909 Unspecified asthma, uncomplicated: Secondary | ICD-10-CM | POA: Insufficient documentation

## 2013-01-17 DIAGNOSIS — Y9383 Activity, rough housing and horseplay: Secondary | ICD-10-CM | POA: Insufficient documentation

## 2013-01-17 DIAGNOSIS — E669 Obesity, unspecified: Secondary | ICD-10-CM | POA: Insufficient documentation

## 2013-01-17 DIAGNOSIS — Z79899 Other long term (current) drug therapy: Secondary | ICD-10-CM | POA: Insufficient documentation

## 2013-01-17 DIAGNOSIS — E785 Hyperlipidemia, unspecified: Secondary | ICD-10-CM | POA: Insufficient documentation

## 2013-01-17 DIAGNOSIS — E119 Type 2 diabetes mellitus without complications: Secondary | ICD-10-CM | POA: Insufficient documentation

## 2013-01-17 IMAGING — CR DG FINGER MIDDLE 2+V*R*
3 series · 3 of 3 positions shown · non-contrast
Comparison: None.

CLINICAL DATA: Pain and swelling of the right long finger.

RIGHT MIDDLE FINGER 2+V

[x finger pa right]
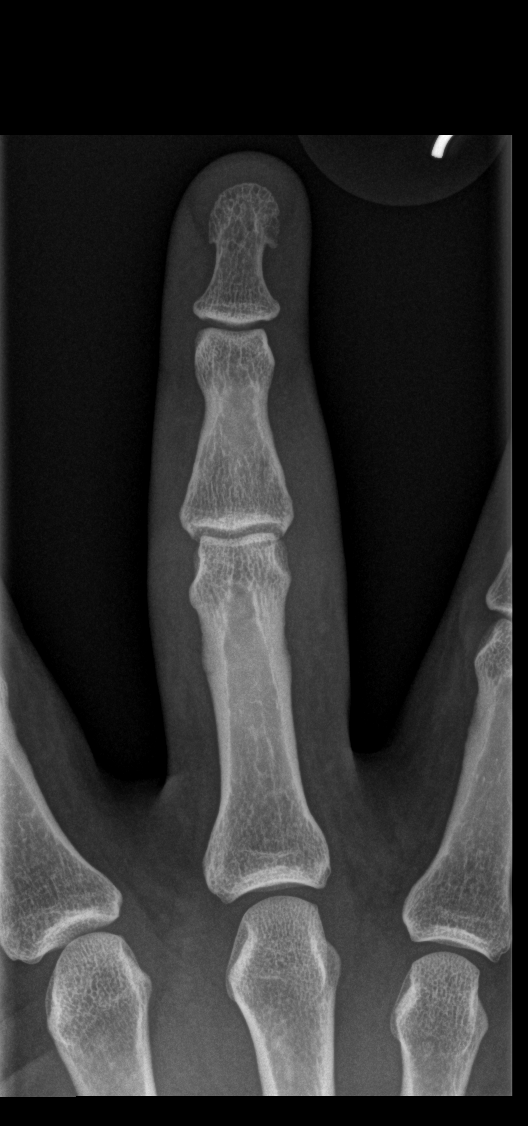

[x finger obl right]
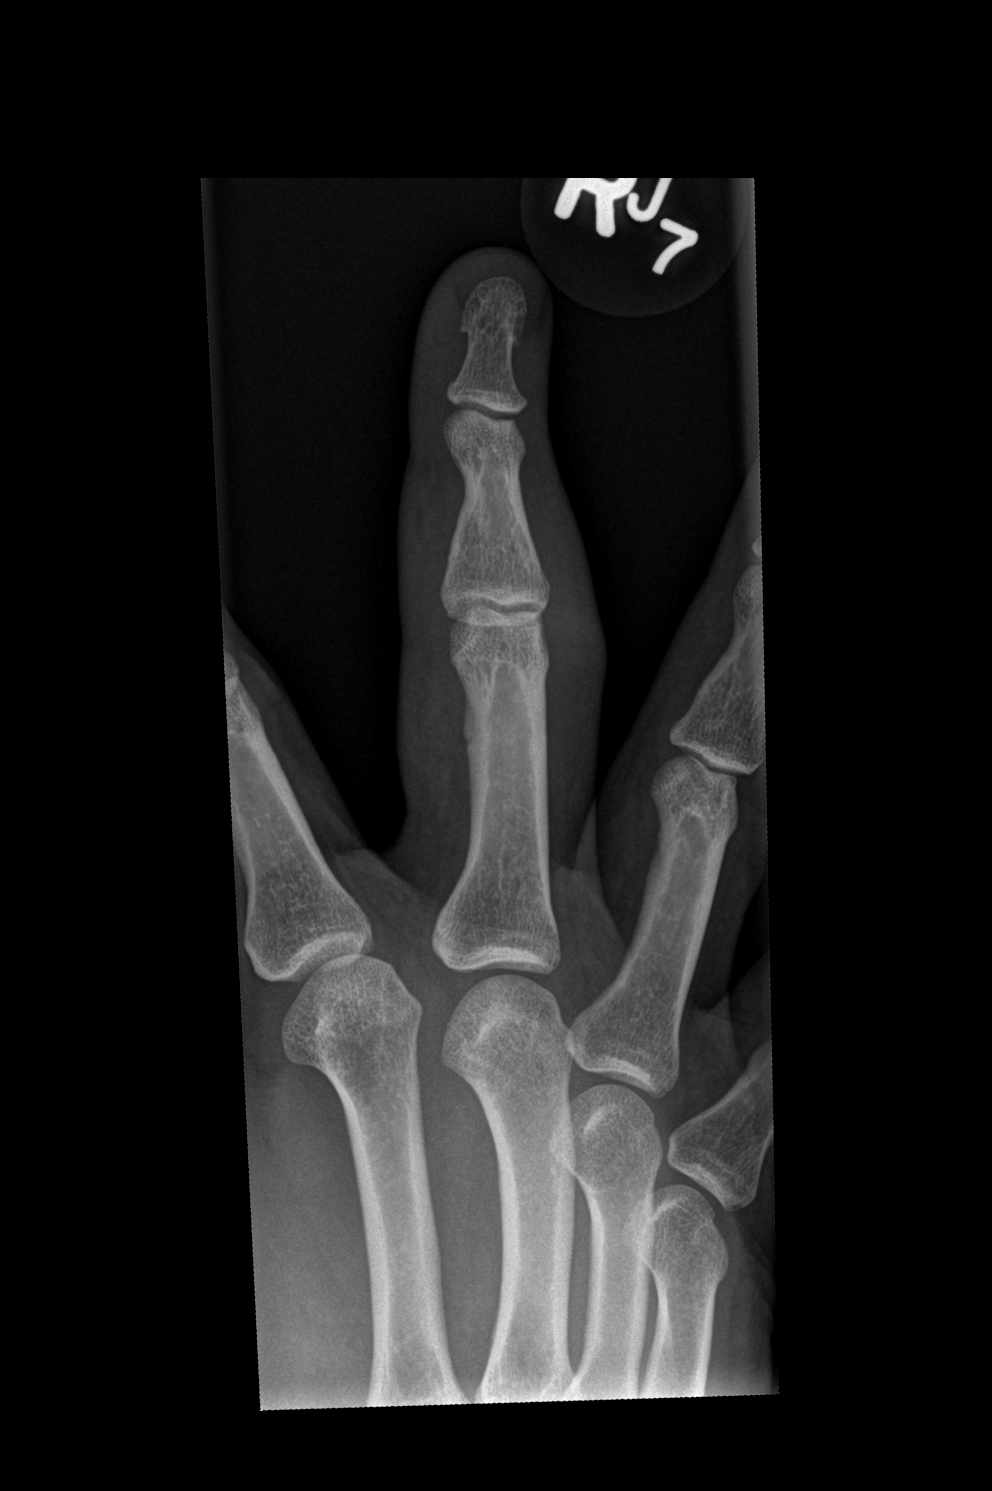

[x finger lat right]
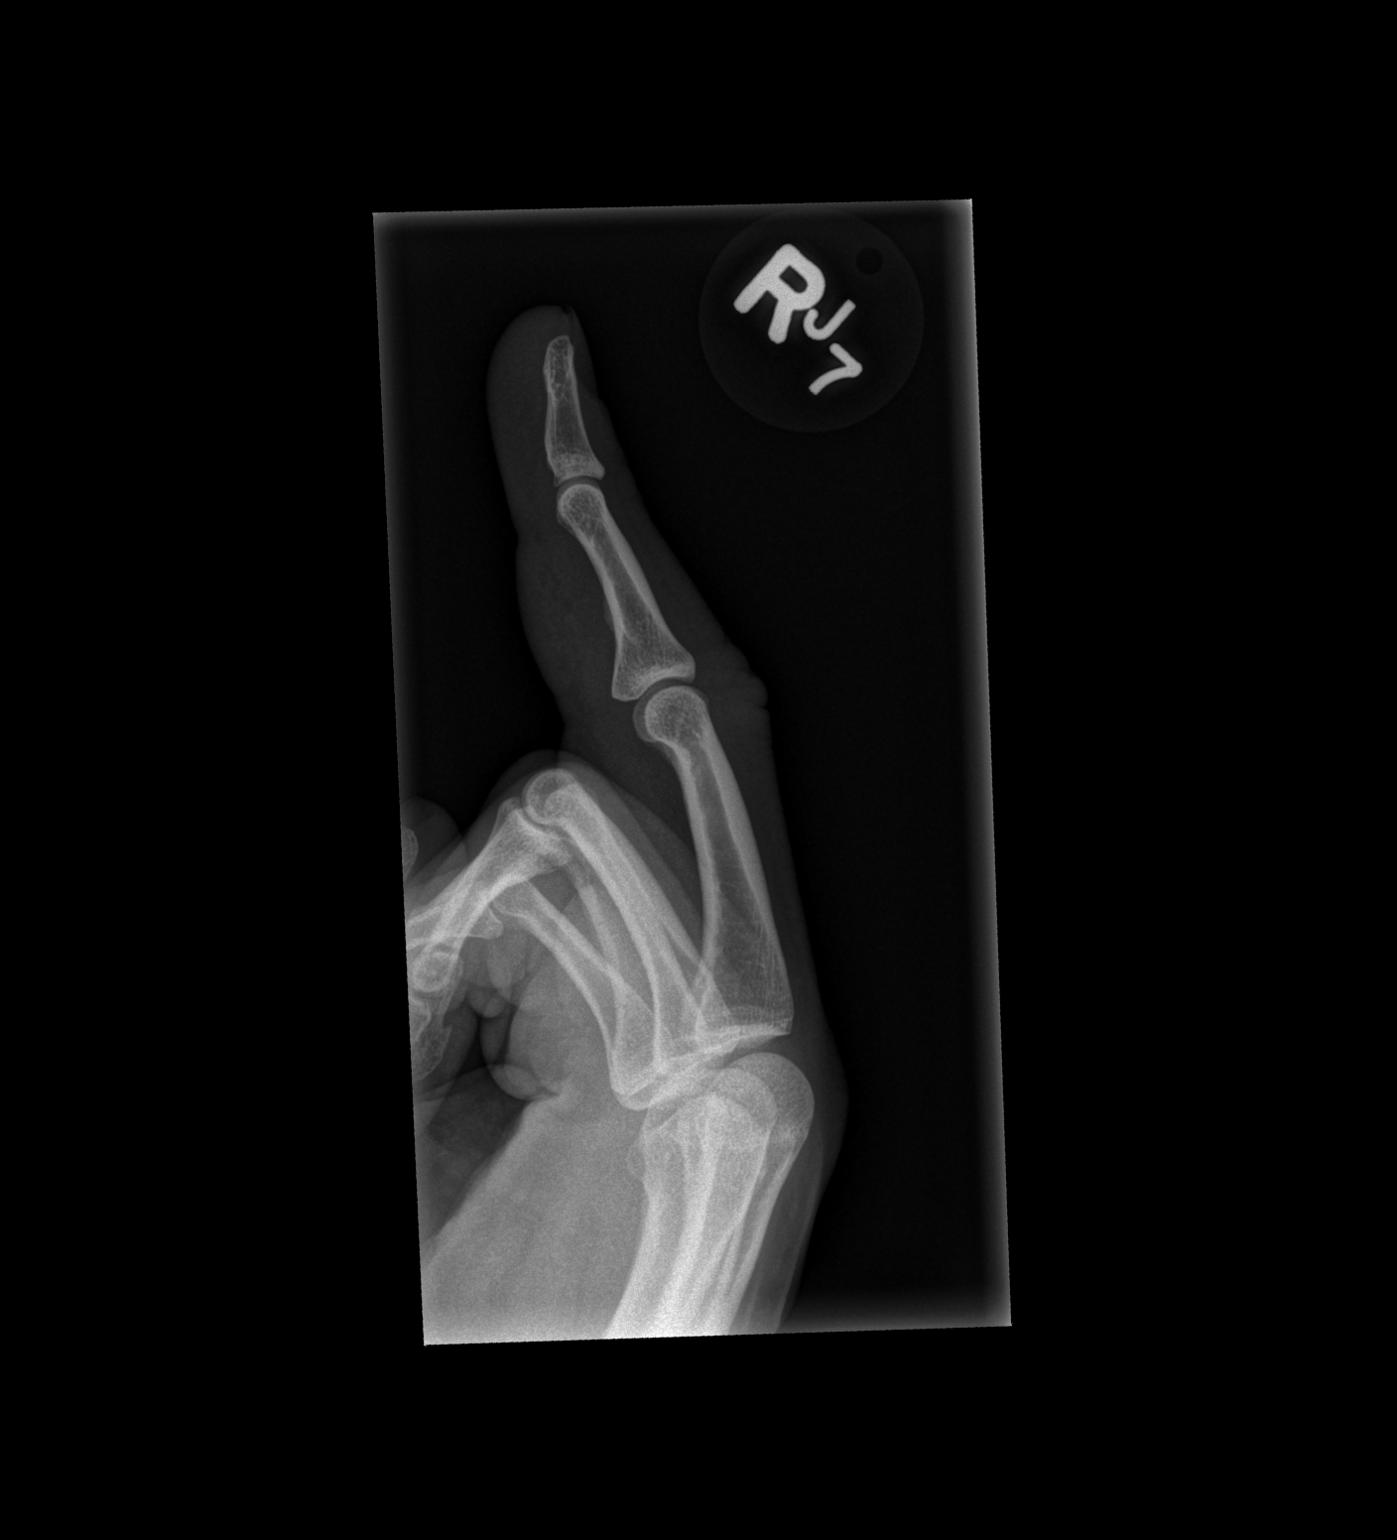

[3 of 3 positions shown; findings below may reference images not displayed]

FINDINGS: Soft tissues of the long finger appear swollen,
particularly about the PIP joint.  No radiopaque foreign body, soft
tissue gas collection, fracture or dislocation is identified.
IMPRESSION: Soft tissue swelling. Otherwise negative.

## 2013-01-17 IMAGING — CR DG HAND COMPLETE 3+V*L*
3 series · 3 of 3 positions shown · non-contrast
Comparison: None.

CLINICAL DATA: Pain and swelling of the left hand.

LEFT HAND - COMPLETE 3+ VIEW

[x hand pa left]
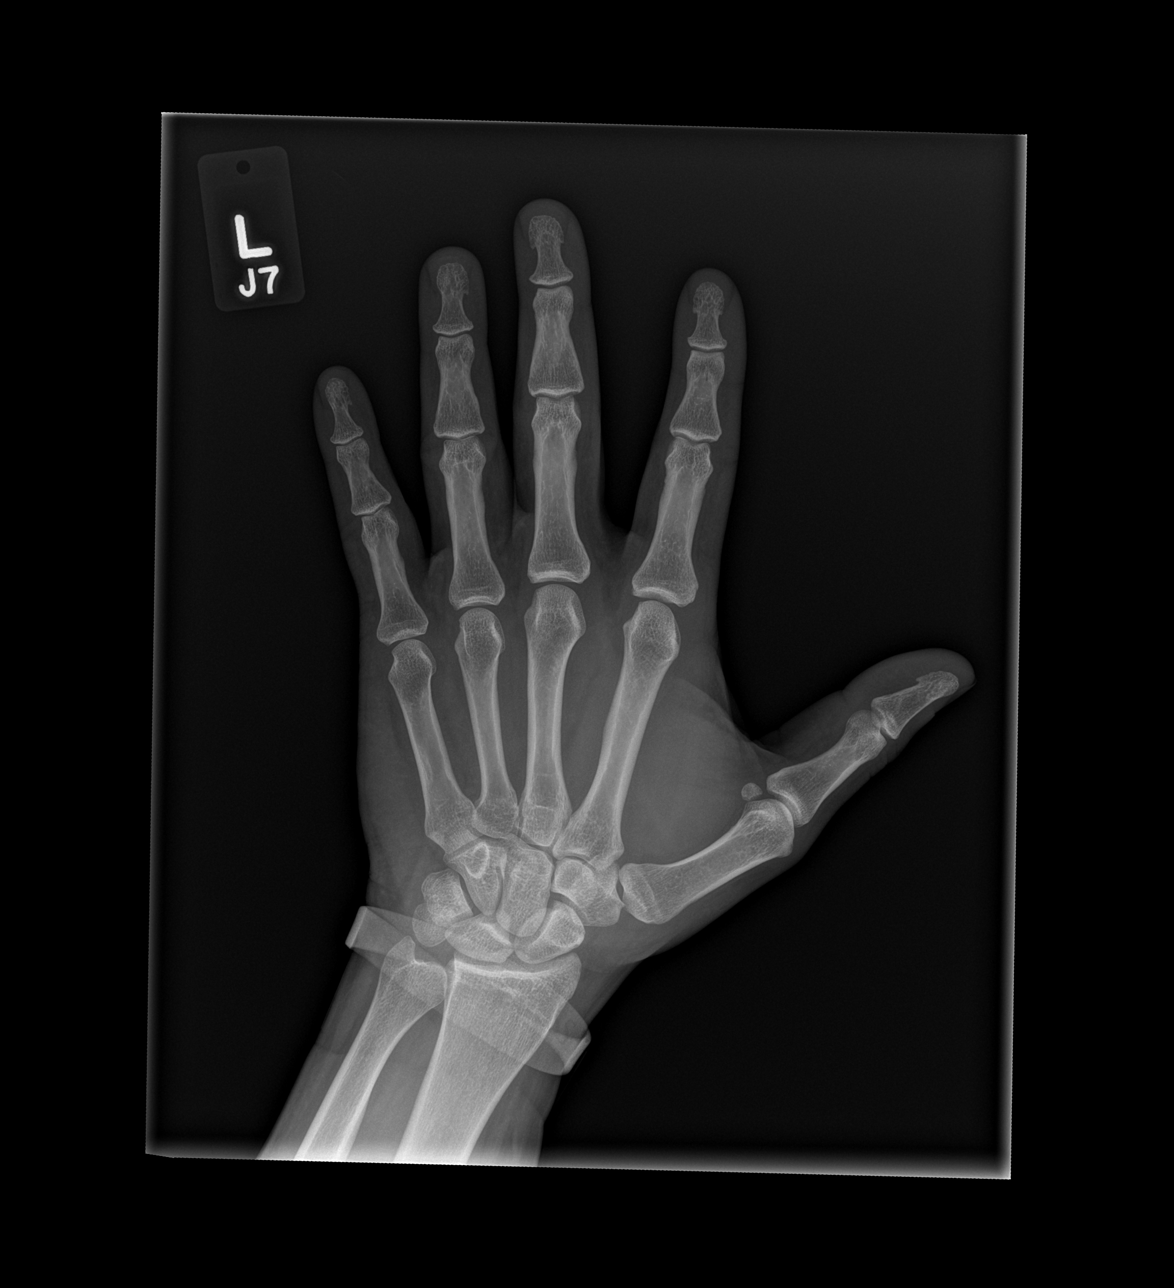

[x hand obl left]
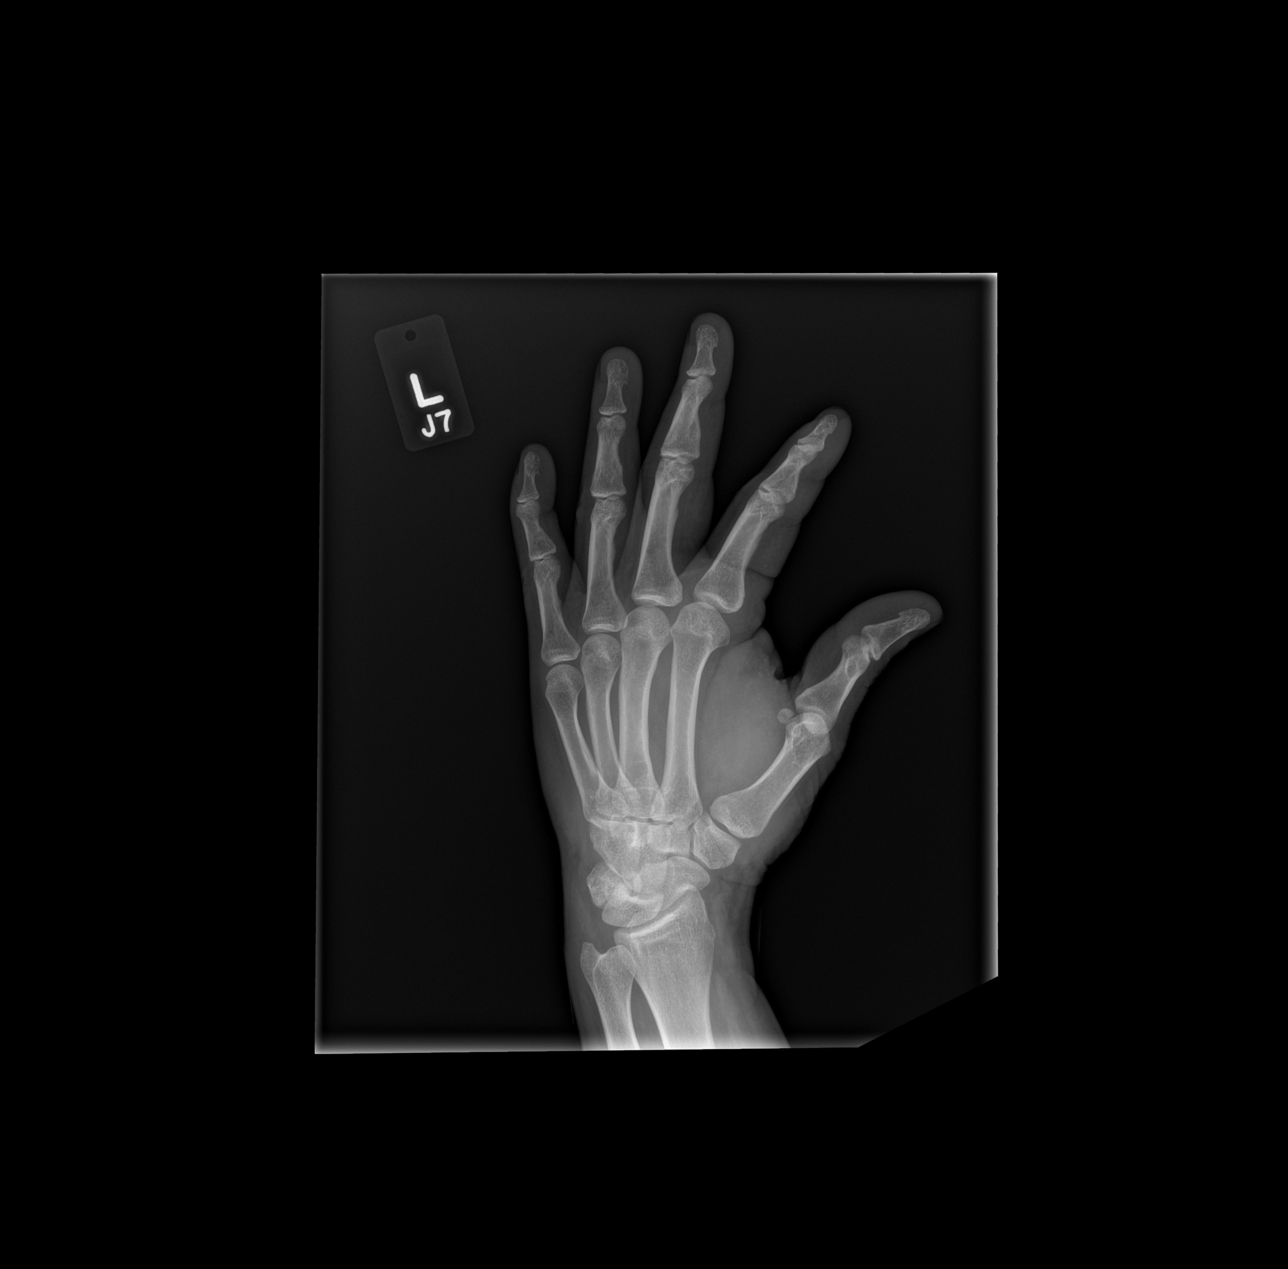

[x hand lat left]
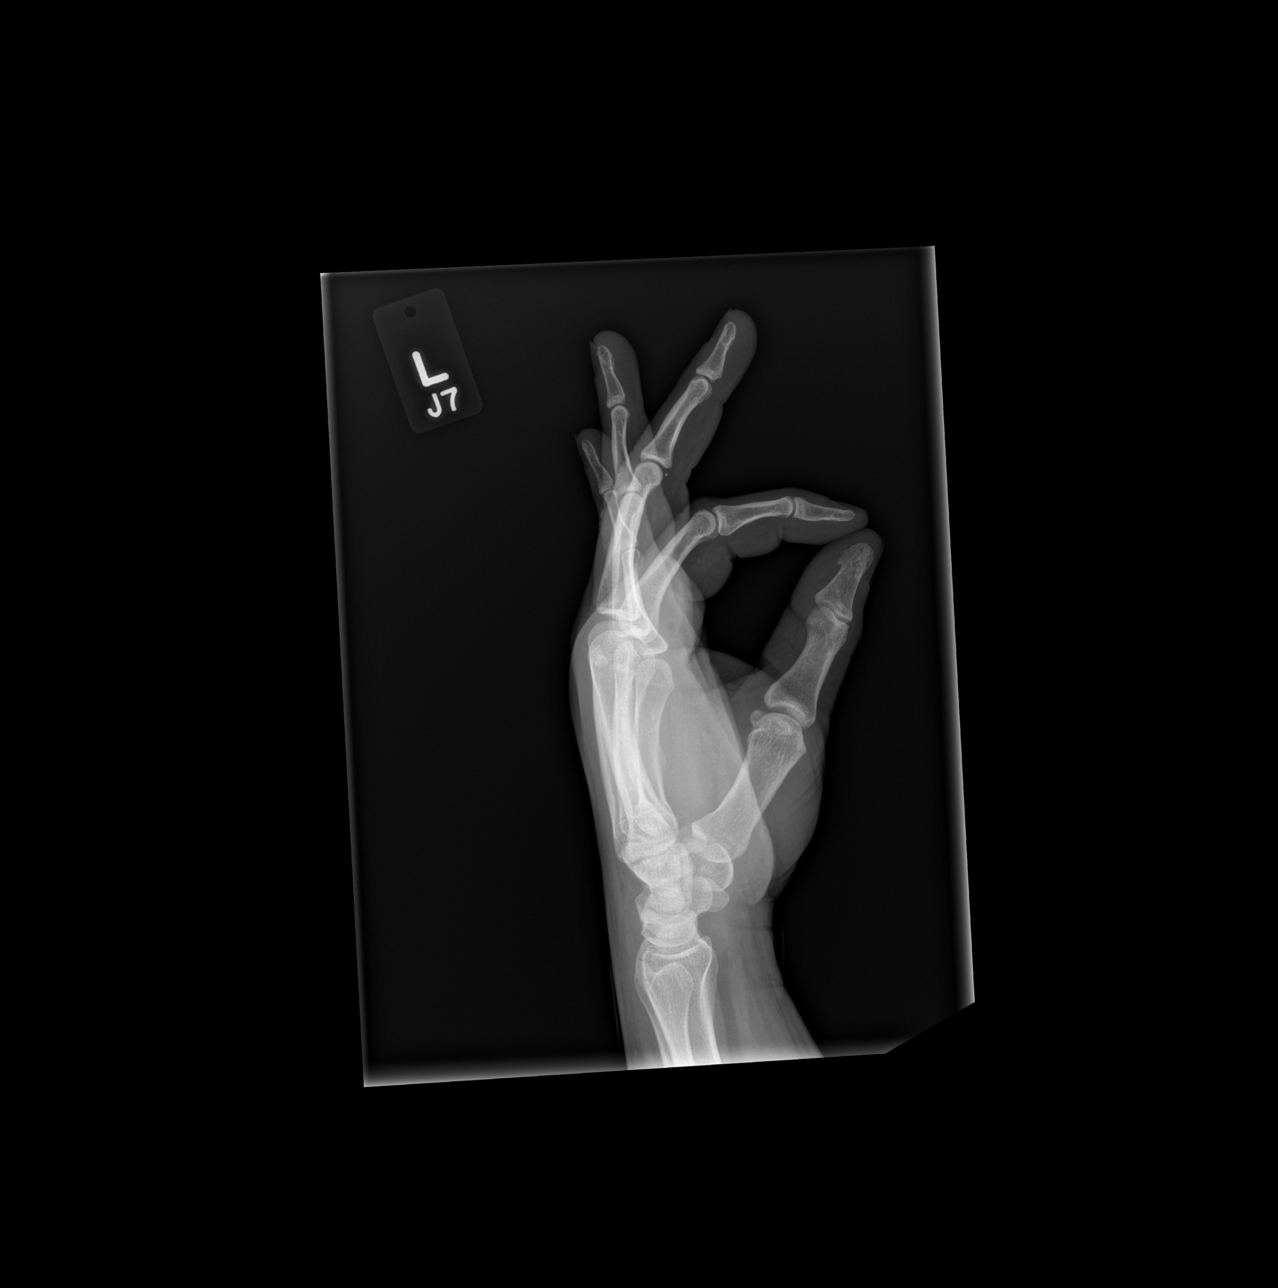

[3 of 3 positions shown; findings below may reference images not displayed]

FINDINGS: No acute bony or joint abnormality is identified.  On the
lateral view, a tiny calcific density projects over the volar
aspect of the PIP joint of the long finger.  No soft tissue gas
collection is identified.
IMPRESSION: Tiny calcific density projecting over the volar aspect of the PIP
joint of the long finger may be due to old trauma.  No definite
acute abnormality is identified.

## 2013-01-17 MED ORDER — TRAMADOL HCL 50 MG PO TABS
50.0000 mg | ORAL_TABLET | Freq: Once | ORAL | Status: AC
  Administered 2013-01-17: 50 mg via ORAL
  Filled 2013-01-17: qty 1

## 2013-01-17 MED ORDER — ONDANSETRON 4 MG PO TBDP
4.0000 mg | ORAL_TABLET | Freq: Once | ORAL | Status: AC
  Administered 2013-01-17: 4 mg via ORAL
  Filled 2013-01-17: qty 1

## 2013-01-17 NOTE — ED Notes (Signed)
Patient horseplaying with friends and injured right third finger and second finger knuckle area.  Swelling and pain present.  Patient reports this happened last Friday.

## 2013-01-17 NOTE — ED Provider Notes (Signed)
History     CSN: [id number]  Arrival date & time 01/17/13  1749   First MD Initiated Contact with Patient 01/17/13 1757      Chief Complaint  Patient presents with  . Finger Injury    (Consider location/radiation/quality/duration/timing/severity/associated sxs/prior treatment) HPI  Pt presents to the ED for evaluation of his right middle finger injury and left thumb injury. He was wresting with a buddy last Friday when he injured the fingers. It has been 1 week and he describes his fingers as continuing to throb and being swollen. Denies any other injuries or concerns at this time. nad vss  Past Medical History  Diagnosis Date  . Diabetes mellitus without complication   . Asthma   . Hyperlipidemia   . Hypertension   . Obesity     History reviewed. No pertinent past surgical history.  Family History  Problem Relation Age of Onset  . Asthma Mother   . Cancer Mother   . Diabetes Mother   . Obesity Mother   . Obesity Sister   . Diabetes Brother   . Obesity Brother   . Cancer Maternal Grandmother   . Diabetes Maternal Grandmother   . Diabetes Other     History  Substance Use Topics  . Smoking status: Current Every Day Smoker -- 0.50 packs/day    Types: Cigarettes  . Smokeless tobacco: Never Used  . Alcohol Use: Yes      Review of Systems  All other systems reviewed and are negative.    Allergies  Review of patient's allergies indicates no known allergies.  Home Medications   Current Outpatient Rx  Name  Route  Sig  Dispense  Refill  . amLODipine (NORVASC) 10 MG tablet   Oral   Take 10 mg by mouth daily.         . furosemide (LASIX) 20 MG tablet   Oral   Take 20 mg by mouth daily.         [name] glipiZIDE (GLUCOTROL) 5 MG tablet   Oral   Take 5 mg by mouth daily before breakfast.         . lisinopril (PRINIVIL,ZESTRIL) 5 MG tablet   Oral   Take 5 mg by mouth daily.         . metFORMIN (GLUMETZA) 500 MG (MOD) 24 hr tablet   Oral   Take  500 mg by mouth daily with breakfast.         . pravastatin (PRAVACHOL) 20 MG tablet   Oral   Take 20 mg by mouth daily.           BP 178/114  Pulse 77  Temp(Src) 98.5 F (36.9 C) (Oral)  Resp 18  SpO2 99%  Physical Exam  Nursing note and vitals reviewed. Constitutional: He appears well-developed and well-nourished. No distress.  HENT:  Head: Normocephalic and atraumatic.  Eyes: Pupils are equal, round, and reactive to light.  Neck: Normal range of motion. Neck supple.  Cardiovascular: Normal rate and regular rhythm.   Pulmonary/Chest: Effort normal.  Abdominal: Soft.  Musculoskeletal:       Hands: Neurological: He is alert.  Skin: Skin is warm and dry.    ED Course  Procedures (including critical care time)  Labs Reviewed - No data to display Dg Hand Complete Left  01/17/2013   *RADIOLOGY REPORT*  Clinical Data: Pain and swelling of the left hand.  LEFT HAND - COMPLETE 3+ VIEW  Comparison: None.  Findings: No acute bony  or joint abnormality is identified.  On the lateral view, a tiny calcific density projects over the volar aspect of the PIP joint of the long finger.  No soft tissue gas collection is identified.  IMPRESSION: Tiny calcific density projecting over the volar aspect of the PIP joint of the long finger may be due to old trauma.  No definite acute abnormality is identified.   Original Report Authenticated By: Orlean Patten, M.D.   Dg Finger Middle Right  01/17/2013   *RADIOLOGY REPORT*  Clinical Data: Pain and swelling of the right long finger.  RIGHT MIDDLE FINGER 2+V  Comparison: None.  Findings: Soft tissues of the long finger appear swollen, particularly about the PIP joint.  No radiopaque foreign body, soft tissue gas collection, fracture or dislocation is identified.  IMPRESSION: Soft tissue swelling. Otherwise negative.   Original Report Authenticated By: Orlean Patten, M.D.     1. Finger injury, left, initial encounter   2. Finger injury,  right, initial encounter       MDM  No acute injury noted on xray.  Buddy tape right middle finger to right ring finger. Thumb spica splint applied to left hand. Pain treated in ED and referral given for hand.  Pt has been advised of the symptoms that warrant their return to the ED. Patient has voiced understanding and has agreed to follow-up with the PCP or specialist.         Linus Mako, PA-C 01/17/13 1846

## 2013-01-19 NOTE — ED Provider Notes (Signed)
Medical screening examination/treatment/procedure(s) were performed by non-physician practitioner and as supervising physician I was immediately available for consultation/collaboration.  Virgel Manifold, MD 01/19/13 [phone]

## 2013-03-04 ENCOUNTER — Encounter: Payer: Self-pay | Admitting: Dietician

## 2013-03-04 ENCOUNTER — Encounter: Payer: Medicaid Other | Attending: Internal Medicine | Admitting: Dietician

## 2013-03-04 VITALS — Wt 272.6 lb

## 2013-03-04 DIAGNOSIS — E119 Type 2 diabetes mellitus without complications: Secondary | ICD-10-CM | POA: Insufficient documentation

## 2013-03-04 DIAGNOSIS — E669 Obesity, unspecified: Secondary | ICD-10-CM | POA: Insufficient documentation

## 2013-03-04 DIAGNOSIS — Z713 Dietary counseling and surveillance: Secondary | ICD-10-CM | POA: Insufficient documentation

## 2013-03-04 NOTE — Progress Notes (Signed)
Medical Nutrition Therapy:  Appt start time: 1730 end time:  1800.  Assessment:  Primary concerns today: type II DM.   MEDICATIONS: see list   DIETARY INTAKE:  Usual eating pattern includes 2 meals and 0 snacks per day.  Everyday foods include hot dogs.  Avoided foods include sweets.    24-hr recall:  B ( AM): skipping breakfast most days  Snk ( AM): none  L ( PM): eggs and hot dogs Snk ( PM): none D ( PM): stew beef, mashed potatoes, collard greens Snk ( PM): none Beverages: water, some EtOH. Social drinking can become binge. Claims he does not really eat snacks or sweets. If so, he will PB crackers or a sandwich.  Usual physical activity: "lazy as hell". Has some dumbbells at home and has been using them with some jumping jacks to workout, but has slacked recently.  Progress Towards Goal(s):  In progress.   Nutritional Diagnosis:  NI-5.8.4 Inconsistent carbohydrate intake As related to meal skipping, type II DM.  As evidenced by pt report of routinely skipping breakfast, only eating a brunch and dinner.    Intervention:  Nutrition counseling provided regarding proper meal pattern, CHO control, inclusion of high protein foods and high fiber foods at each meal. Exercise options also discussed. RD provided home workout program, and displayed inexpensive options to aid in home exercise, such as jump rope and small peddler he can use from the couch. RD also provided a log to track his BG fasting and 2 hours PP.  Handouts given during visit include:  Home Workout  High Fiber, Protein foods list  Monitoring/Evaluation:  Dietary intake, exercise, and body weight in 2 month(s).

## 2013-05-10 ENCOUNTER — Ambulatory Visit: Payer: Medicaid Other | Admitting: Dietician

## 2014-02-17 ENCOUNTER — Emergency Department (HOSPITAL_COMMUNITY)
Admission: EM | Admit: 2014-02-17 | Discharge: 2014-02-18 | Disposition: A | Discharge status: Home / Self Care | Payer: Medicaid Other | Attending: Emergency Medicine | Admitting: Emergency Medicine

## 2014-02-17 ENCOUNTER — Encounter (HOSPITAL_COMMUNITY): Payer: Self-pay | Admitting: Emergency Medicine

## 2014-02-17 DIAGNOSIS — E669 Obesity, unspecified: Secondary | ICD-10-CM | POA: Insufficient documentation

## 2014-02-17 DIAGNOSIS — S90859A Superficial foreign body, unspecified foot, initial encounter: Secondary | ICD-10-CM

## 2014-02-17 DIAGNOSIS — Z79899 Other long term (current) drug therapy: Secondary | ICD-10-CM | POA: Insufficient documentation

## 2014-02-17 DIAGNOSIS — E785 Hyperlipidemia, unspecified: Secondary | ICD-10-CM | POA: Insufficient documentation

## 2014-02-17 DIAGNOSIS — W268XXA Contact with other sharp object(s), not elsewhere classified, initial encounter: Secondary | ICD-10-CM | POA: Insufficient documentation

## 2014-02-17 DIAGNOSIS — Y939 Activity, unspecified: Secondary | ICD-10-CM | POA: Insufficient documentation

## 2014-02-17 DIAGNOSIS — Z23 Encounter for immunization: Secondary | ICD-10-CM | POA: Insufficient documentation

## 2014-02-17 DIAGNOSIS — F17200 Nicotine dependence, unspecified, uncomplicated: Secondary | ICD-10-CM | POA: Insufficient documentation

## 2014-02-17 DIAGNOSIS — I10 Essential (primary) hypertension: Secondary | ICD-10-CM | POA: Insufficient documentation

## 2014-02-17 DIAGNOSIS — S91309A Unspecified open wound, unspecified foot, initial encounter: Secondary | ICD-10-CM | POA: Insufficient documentation

## 2014-02-17 DIAGNOSIS — J45909 Unspecified asthma, uncomplicated: Secondary | ICD-10-CM | POA: Insufficient documentation

## 2014-02-17 DIAGNOSIS — Y929 Unspecified place or not applicable: Secondary | ICD-10-CM | POA: Insufficient documentation

## 2014-02-17 DIAGNOSIS — E119 Type 2 diabetes mellitus without complications: Secondary | ICD-10-CM | POA: Insufficient documentation

## 2014-02-17 MED ORDER — HYDROCODONE-ACETAMINOPHEN 5-325 MG PO TABS: 1.0000 | ORAL_TABLET | ORAL | Status: DC | PRN

## 2014-02-17 MED ORDER — TETANUS-DIPHTH-ACELL PERTUSSIS 5-2.5-18.5 LF-MCG/0.5 IM SUSP
0.5000 mL | Freq: Once | INTRAMUSCULAR | Status: AC
  Administered 2014-02-18: 0.5 mL via INTRAMUSCULAR
  Filled 2014-02-17: qty 0.5

## 2014-02-17 NOTE — ED Notes (Signed)
Pt states stepped on a piece of glass yesterday, complaining of R foot pain, small dark spot on foot, states hurts when putting pressure on foot.

## 2014-02-17 NOTE — Discharge Instructions (Signed)
Keep your wound clean and dry. Watch for any increasing pain, swelling, bleeding or drainage. Followup with your primary care provider for continued evaluation and treatment.

## 2014-02-17 NOTE — ED Provider Notes (Signed)
CSN: [id number]     Arrival date & time 02/17/14  2102 History   None    This chart was scribed for non-physician practitioner, Hazel Sams PA-C, working with Orlie Dakin, MD by Forrestine Him, ED Scribe. This patient was seen in room WTR8/WTR8 and the patient's care was started at 11:06 PM.   Chief Complaint  Patient presents with  . Foot Pain    right   The history is provided by the patient. No language interpreter was used.    HPI Comments: Bruce Hunter is a 38 y.o. male who presents to the Emergency Department complaining of intermittent, mild R foot pain x 1 day that is unchanged. Pt states he stepped on a piece of glass yesterday. States he removed majority of the glass. However, he states he still feels a sharp sensation upon weight bearing. At this time he denies any fever, chills, loss of sensation, numbness, or paresthesia. Pt may be due for a Tetanus shot. He has no other pertinent past medical history. No other concerns this visit.  Past Medical History  Diagnosis Date  . Diabetes mellitus without complication   . Asthma   . Hyperlipidemia   . Hypertension   . Obesity    No past surgical history on file. Family History  Problem Relation Age of Onset  . Asthma Mother   . Cancer Mother   . Diabetes Mother   . Obesity Mother   . Obesity Sister   . Diabetes Brother   . Obesity Brother   . Cancer Maternal Grandmother   . Diabetes Maternal Grandmother   . Diabetes Other    History  Substance Use Topics  . Smoking status: Current Every Day Smoker -- 0.50 packs/day    Types: Cigarettes  . Smokeless tobacco: Never Used  . Alcohol Use: Yes    Review of Systems  Constitutional: Negative for fever and chills.  HENT: Negative for congestion.   Eyes: Negative for redness.  Respiratory: Negative for cough.   Musculoskeletal: Positive for arthralgias (R foot).  Skin: Positive for wound (Puncture wound to lateral plantar surface of mid R foot). Negative for rash.   Neurological: Negative for numbness.  Psychiatric/Behavioral: Negative for confusion.      Allergies  Review of patient's allergies indicates no known allergies.  Home Medications   Prior to Admission medications   Medication Sig Start Date End Date Taking? Authorizing Provider  amLODipine (NORVASC) 10 MG tablet Take 10 mg by mouth daily.   Yes Historical Provider, MD  glipiZIDE (GLUCOTROL) 5 MG tablet Take 5 mg by mouth daily before breakfast.   Yes Historical Provider, MD  lisinopril (PRINIVIL,ZESTRIL) 5 MG tablet Take 5 mg by mouth daily.   Yes Historical Provider, MD  metFORMIN (GLUMETZA) 500 MG (MOD) 24 hr tablet Take 500 mg by mouth daily with breakfast.   Yes Historical Provider, MD  pravastatin (PRAVACHOL) 20 MG tablet Take 20 mg by mouth daily.   Yes Historical Provider, MD   Triage Vitals: BP 148/94  Pulse 76  Temp(Src) 97.8 F (36.6 C) (Oral)  Resp 18  Ht 5\' 9"  (1.753 m)  Wt 285 lb (129.275 kg)  BMI 42.07 kg/m2  SpO2 95%   Physical Exam  Nursing note and vitals reviewed. Constitutional: He is oriented to person, place, and time. He appears well-developed and well-nourished.  HENT:  Head: Normocephalic.  Eyes: EOM are normal.  Neck: Normal range of motion.  Pulmonary/Chest: Effort normal.  Abdominal: He exhibits no distension.  Musculoskeletal: Normal range of motion.  Neurological: He is alert and oriented to person, place, and time.  Skin:  1 mm puncture wound to lateral plantar surface of mid R foot No bleeding or drainage  Psychiatric: He has a normal mood and affect.    ED Course  FOREIGN BODY REMOVAL Date/Time: 02/17/2014 11:40 AM Performed by: Martie Lee Authorized by: Martie Lee Consent: Verbal consent obtained. Risks and benefits: risks, benefits and alternatives were discussed Consent given by: patient Patient understanding: patient states understanding of the procedure being performed Patient consent: the patient's understanding  of the procedure matches consent given Procedure consent: procedure consent matches procedure scheduled Patient identity confirmed: verbally with patient Time out: Immediately prior to procedure a "time out" was called to verify the correct patient, procedure, equipment, support staff and site/side marked as required. Body area: skin General location: lower extremity Location details: right foot Anesthesia: local infiltration Local anesthetic: lidocaine 2% without epinephrine Localization method: probed Removal mechanism: scalpel Dressing: antibiotic ointment and dressing applied Tendon involvement: none Depth: subcutaneous Complexity: simple 1 objects recovered. Objects recovered: glass Post-procedure assessment: foreign body removed Patient tolerance: Patient tolerated the procedure well with no immediate complications.     DIAGNOSTIC STUDIES: Oxygen Saturation is 95% on RA, adequate by my interpretation.    COORDINATION OF CARE: 10:46 PM-Discussed treatment plan with pt at bedside and pt agreed to plan.       MDM   Final diagnoses:  Foreign body in foot      I personally performed the services described in this documentation, which was scribed in my presence. The recorded information has been reviewed and is accurate.    Martie Lee, PA-C 02/18/14 [phone]

## 2014-02-20 NOTE — ED Provider Notes (Signed)
Medical screening examination/treatment/procedure(s) were performed by non-physician practitioner and as supervising physician I was immediately available for consultation/collaboration.   EKG Interpretation None       Orlie Dakin, MD 02/20/14 1503

## 2018-01-19 ENCOUNTER — Emergency Department (HOSPITAL_COMMUNITY): Payer: Medicaid Other

## 2018-01-19 ENCOUNTER — Emergency Department (HOSPITAL_COMMUNITY)
Admission: EM | Admit: 2018-01-19 | Discharge: 2018-01-19 | Disposition: A | Discharge status: Home / Self Care | Payer: Medicaid Other | Attending: Emergency Medicine | Admitting: Emergency Medicine

## 2018-01-19 ENCOUNTER — Encounter (HOSPITAL_COMMUNITY): Payer: Self-pay | Admitting: Emergency Medicine

## 2018-01-19 DIAGNOSIS — Y929 Unspecified place or not applicable: Secondary | ICD-10-CM | POA: Diagnosis not present

## 2018-01-19 DIAGNOSIS — W010XXA Fall on same level from slipping, tripping and stumbling without subsequent striking against object, initial encounter: Secondary | ICD-10-CM | POA: Insufficient documentation

## 2018-01-19 DIAGNOSIS — S93401A Sprain of unspecified ligament of right ankle, initial encounter: Secondary | ICD-10-CM | POA: Insufficient documentation

## 2018-01-19 DIAGNOSIS — J45909 Unspecified asthma, uncomplicated: Secondary | ICD-10-CM | POA: Diagnosis not present

## 2018-01-19 DIAGNOSIS — Y939 Activity, unspecified: Secondary | ICD-10-CM | POA: Insufficient documentation

## 2018-01-19 DIAGNOSIS — Y999 Unspecified external cause status: Secondary | ICD-10-CM | POA: Diagnosis not present

## 2018-01-19 DIAGNOSIS — I10 Essential (primary) hypertension: Secondary | ICD-10-CM | POA: Insufficient documentation

## 2018-01-19 DIAGNOSIS — E119 Type 2 diabetes mellitus without complications: Secondary | ICD-10-CM | POA: Diagnosis not present

## 2018-01-19 DIAGNOSIS — S99911A Unspecified injury of right ankle, initial encounter: Secondary | ICD-10-CM | POA: Diagnosis present

## 2018-01-19 IMAGING — CR DG ANKLE COMPLETE 3+V*R*
3 series · 3 of 3 positions shown · non-contrast
Comparison: None.

CLINICAL DATA: Ankle injury 3 days ago

EXAM:
RIGHT ANKLE - COMPLETE 3+ VIEW

[x ankle ap right]
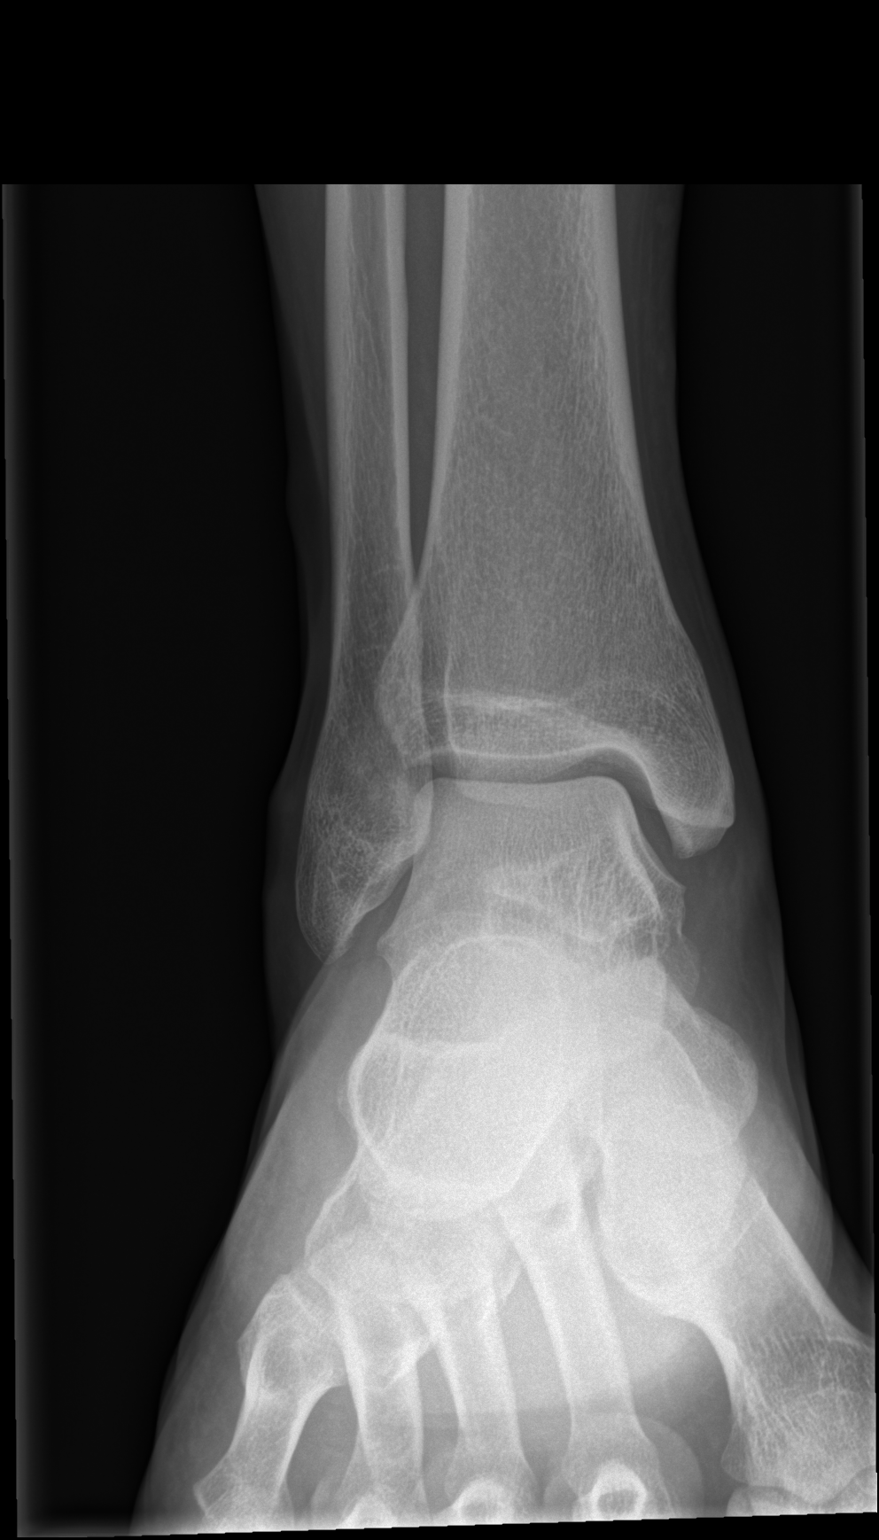

[x ankle obl right]
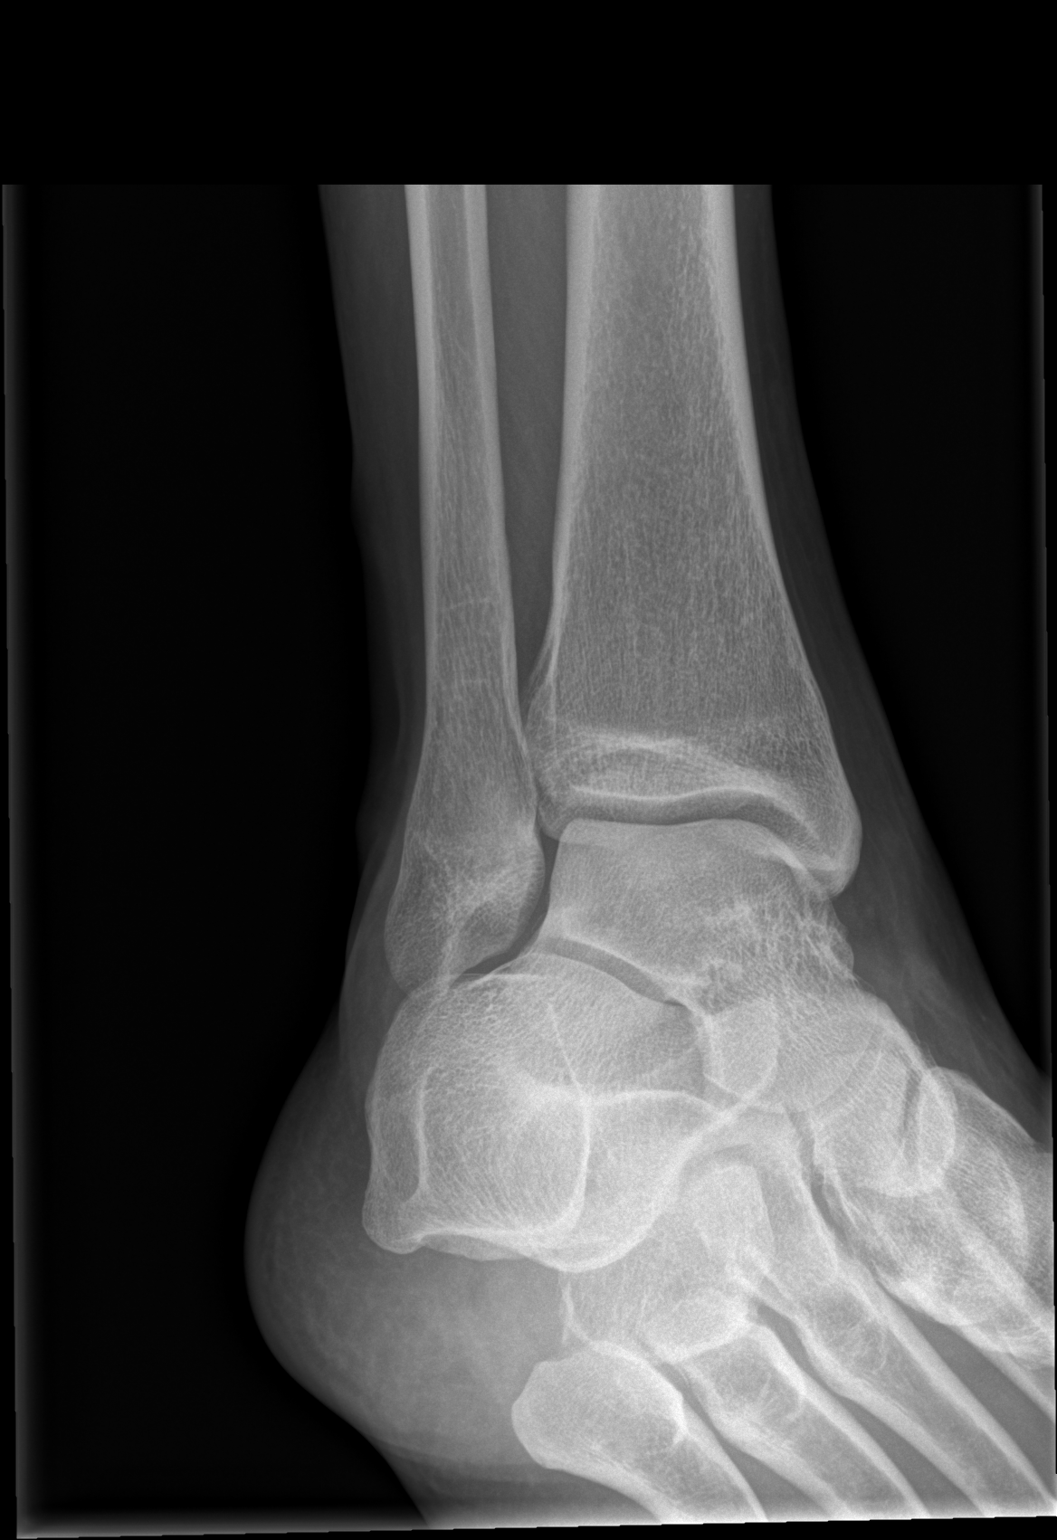

[x ankle lat right]
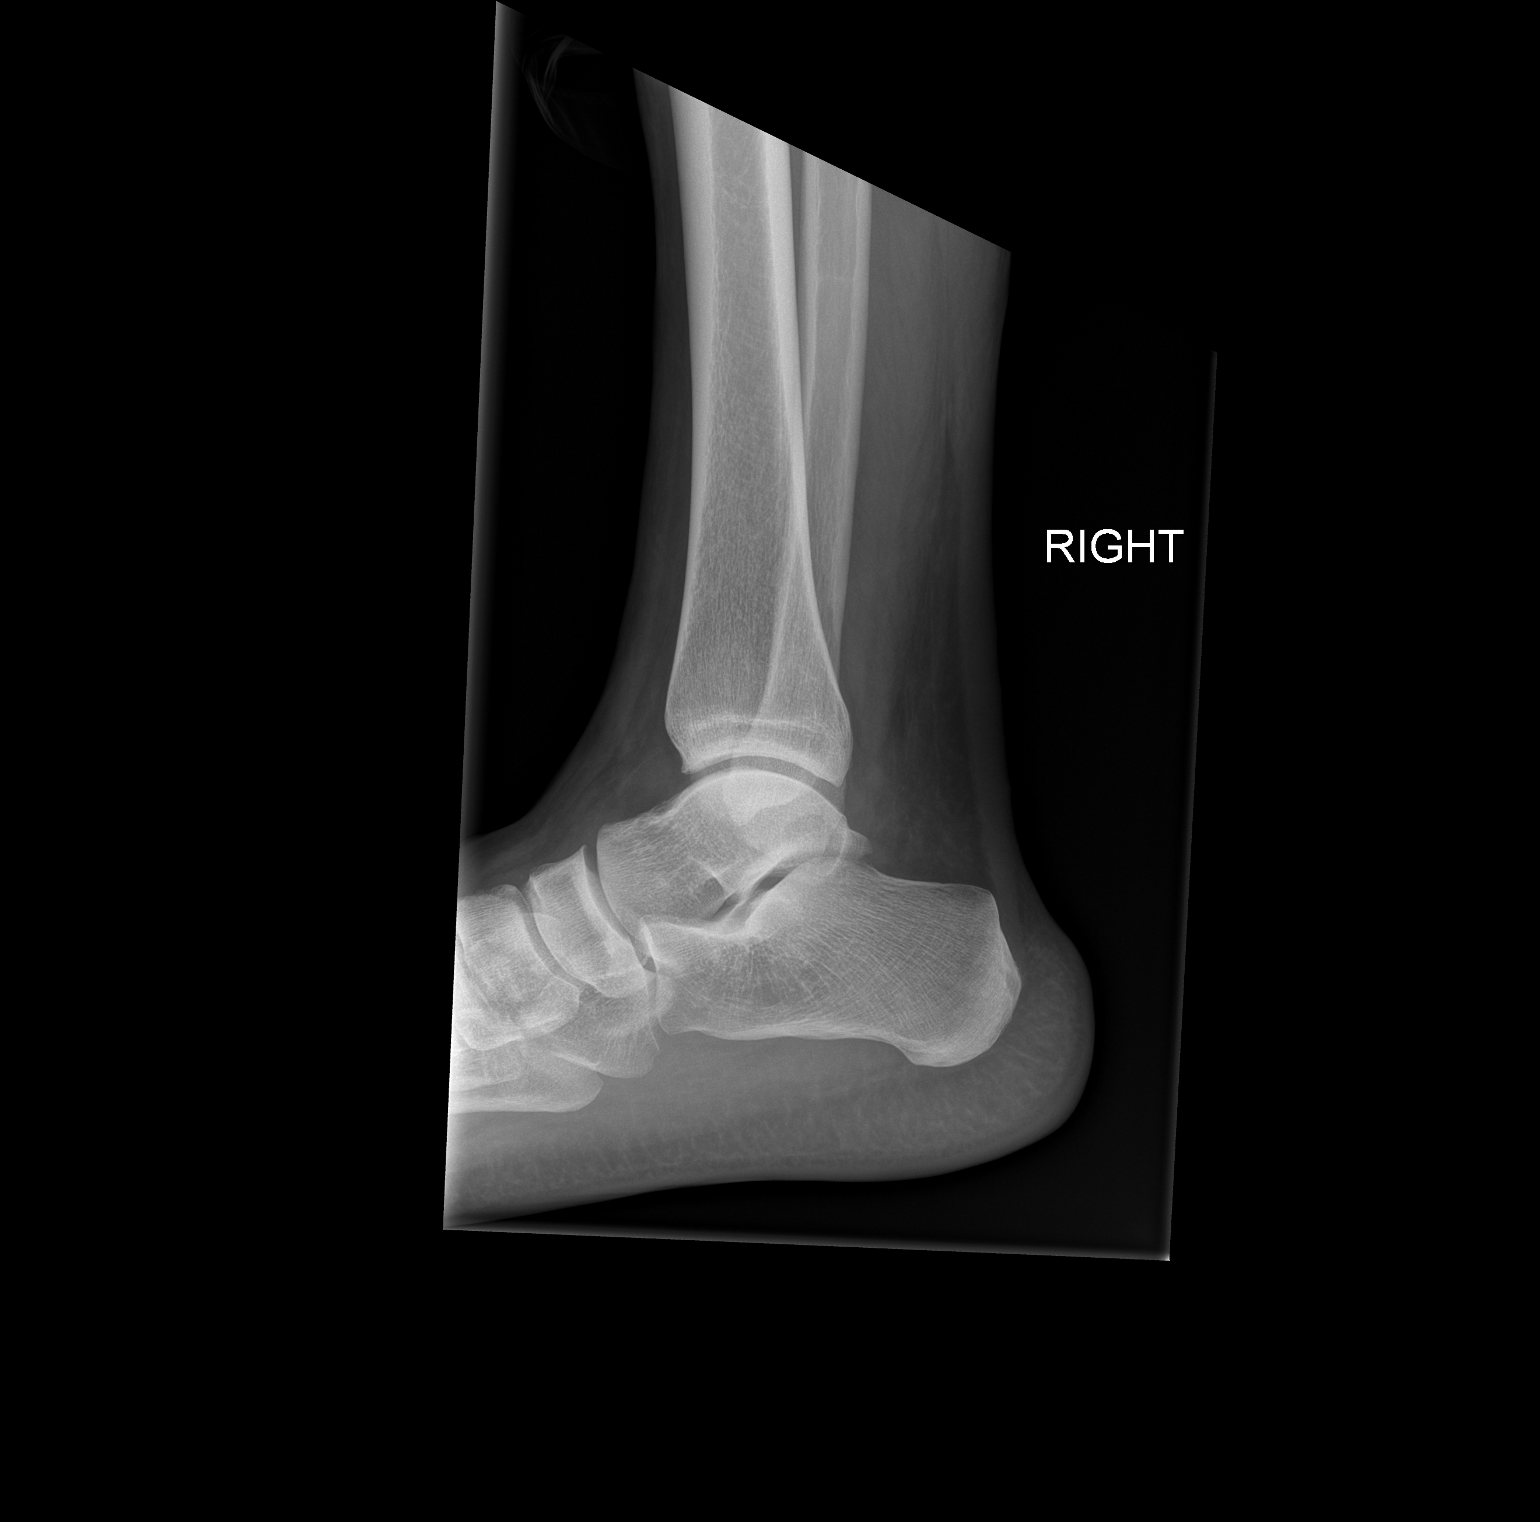

[3 of 3 positions shown; findings below may reference images not displayed]

FINDINGS: There is no evidence of fracture, dislocation, or joint effusion.
There is no evidence of arthropathy or other focal bone abnormality.
Soft tissues are unremarkable.
IMPRESSION: Negative.

## 2018-01-19 MED ORDER — ACETAMINOPHEN 500 MG PO TABS
1000.0000 mg | ORAL_TABLET | Freq: Once | ORAL | Status: AC
  Administered 2018-01-19: 1000 mg via ORAL
  Filled 2018-01-19: qty 2

## 2018-01-19 NOTE — ED Provider Notes (Signed)
[facility] DEPT Provider Note   CSN: [id number] Arrival date & time: 01/19/18  1200     History   Chief Complaint Chief Complaint  Patient presents with  . Ankle Pain    HPI Bruce Hunter is a 42 y.o. male.  Bruce Hunter is a 42 y.o. Male with a history of asthma, diabetes, hypertension and hyperlipidemia, who presents to the emergency department for evaluation of ankle pain.  Patient reports he tripped over something and rolled his right ankle 2 days ago.  Since then he has had persistent and worsening pain and swelling over the lateral aspect of the right ankle.  Patient reports pain with movement or weightbearing.  He denies any numbness or tingling.  No overlying redness or warmth and he denies any fevers.  No previous injury or surgeries to this ankle.  No pain at the knee.  Patient has not tried anything to treat this pain prior to arrival.  No other aggravating or alleviating factors.     Past Medical History:  Diagnosis Date  . Asthma   . Diabetes mellitus without complication ([place])   . Hyperlipidemia   . Hypertension   . Obesity     There are no active problems to display for this patient.   History reviewed. No pertinent surgical history.      Home Medications    Prior to Admission medications   Medication Sig Start Date End Date Taking? Authorizing Provider  amLODipine (NORVASC) 10 MG tablet Take 10 mg by mouth daily.    [provider]  glipiZIDE (GLUCOTROL) 5 MG tablet Take 5 mg by mouth daily before breakfast.    [provider]  HYDROcodone-acetaminophen (NORCO/VICODIN) 5-325 MG per tablet Take 1 tablet by mouth every 4 (four) hours as needed for moderate pain. 02/17/14   Hazel Sams, PA-C  lisinopril (PRINIVIL,ZESTRIL) 5 MG tablet Take 5 mg by mouth daily.    [provider]  metFORMIN (GLUMETZA) 500 MG (MOD) 24 hr tablet Take 500 mg by mouth daily with breakfast.    [provider]   pravastatin (PRAVACHOL) 20 MG tablet Take 20 mg by mouth daily.    [provider]    Family History Family History  Problem Relation Age of Onset  . Asthma Mother   . Cancer Mother   . Diabetes Mother   . Obesity Mother   . Obesity Sister   . Diabetes Brother   . Obesity Brother   . Cancer Maternal Grandmother   . Diabetes Maternal Grandmother   . Diabetes Other     Social History Social History   Tobacco Use  . Smoking status: Current Every Day Smoker    Packs/day: 0.50    Types: Cigarettes  . Smokeless tobacco: Never Used  Substance Use Topics  . Alcohol use: Yes  . Drug use: Yes    Frequency: 1.0 times per week    Types: Marijuana     Allergies   Patient has no known allergies.   Review of Systems Review of Systems  Constitutional: Negative for chills and fever.  Musculoskeletal: Positive for arthralgias and joint swelling.  Skin: Negative for color change, rash and wound.  Neurological: Negative for weakness and numbness.     Physical Exam Updated Vital Signs BP (!) 169/130 (BP Location: Right Arm)   Pulse 82   Temp 99.9 F (37.7 C) (Oral)   Resp 18   SpO2 94%   Physical Exam  Constitutional: He  appears well-developed and well-nourished. No distress.  HENT:  Head: Normocephalic and atraumatic.  Eyes: Right eye exhibits no discharge. Left eye exhibits no discharge.  Pulmonary/Chest: Effort normal. No respiratory distress.  Musculoskeletal:  Pain and swelling over the lateral aspect of the right ankle, no tenderness over the medial aspect of the calcaneus, no pain or tenderness over the forefoot.  There is no overlying erythema, warmth or skin changes.  Patient is able to dorsi and plantarflex but it is painful, 2+ DP and TP pulses with good capillary refill, sensation intact.  Unremarkable knee exam.  Neurological: He is alert. Coordination normal.  Skin: Skin is warm and dry. Capillary refill takes less than 2 seconds. He is not  diaphoretic.  Psychiatric: He has a normal mood and affect. His behavior is normal.  Nursing note and vitals reviewed.    ED Treatments / Results  Labs (all labs ordered are listed, but only abnormal results are displayed) Labs Reviewed - No data to display  EKG None  Radiology Dg Ankle Complete Right  Result Date: 01/19/2018 CLINICAL DATA:  Ankle injury 3 days ago EXAM: RIGHT ANKLE - COMPLETE 3+ VIEW COMPARISON:  None. FINDINGS: There is no evidence of fracture, dislocation, or joint effusion. There is no evidence of arthropathy or other focal bone abnormality. Soft tissues are unremarkable. IMPRESSION: Negative. Electronically Signed   By: Franchot Gallo M.D.   On: 01/19/2018 14:32    Procedures Procedures (including critical care time)  Medications Ordered in ED Medications  acetaminophen (TYLENOL) tablet 1,000 mg (has no administration in time range)     Initial Impression / Assessment and Plan / ED Course  I have reviewed the triage vital signs and the nursing notes.  Pertinent labs & imaging results that were available during my care of the patient were reviewed by me and considered in my medical decision making (see chart for details).  Presentation consistent with ankle sprain. Tenderness and swelling over lateral malleolus, pt is neurovascularly intact, and x-ray negative for fracture, and shows ankle mortise is intact. Pain treated in the ED. Pt placed in ASO brace and provided crutches, ambulated without difficulty. Pt stable for discharge home with NSAIDs/Tylenol for pain. Pt to follow-up with ortho in one week if symptoms not improving. Return precautions discussed, Pt expresses understanding and agrees with plan.  Pt's blood pressure was elevated today, pt has hx of HTN, not taking their medications, pt is not exhibiting any symptoms to suggest hypertensive urgency or emergency today, will have pt follow up with their PCP in 1 week for blood pressure check. Discussed  long term consequences of untreated hypertension with the patient.  Final Clinical Impressions(s) / ED Diagnoses   Final diagnoses:  Sprain of right ankle, unspecified ligament, initial encounter  Hypertension, unspecified type    ED Discharge Orders    None       Jacqlyn Larsen, [place] 01/19/18 1551    Charlesetta Shanks, MD 01/28/18 [phone]

## 2018-01-19 NOTE — ED Triage Notes (Signed)
Per pt, states he twisted his right ankle 2 days ago-states increased pain and unable to bear weight

## 2018-01-19 NOTE — Discharge Instructions (Signed)
X-ray shows no evidence of fracture, you have an ankle sprain.  Please remain in ankle brace and use crutches, ice and elevate the ankle as much as possible.  Tylenol and Aleve as needed for pain.  If pain is not improving after 1 week of this conservative therapy please follow-up with your primary care doctor for continued management.  If you have significantly increased pain, swelling, redness over the ankle, fevers, numbness or any other new or concerning symptoms you can return to the emergency department for reevaluation.  Your blood pressure was elevated today, please follow up with your primary doctor within 1 week for blood pressure recheck.

## 2019-05-03 ENCOUNTER — Ambulatory Visit: Payer: Medicaid Other | Admitting: Podiatry

## 2019-05-16 ENCOUNTER — Ambulatory Visit (INDEPENDENT_AMBULATORY_CARE_PROVIDER_SITE_OTHER): Payer: Medicaid Other

## 2019-05-16 ENCOUNTER — Other Ambulatory Visit: Payer: Self-pay | Admitting: Podiatry

## 2019-05-16 ENCOUNTER — Ambulatory Visit (INDEPENDENT_AMBULATORY_CARE_PROVIDER_SITE_OTHER): Payer: Medicaid Other | Admitting: Podiatry

## 2019-05-16 ENCOUNTER — Other Ambulatory Visit: Payer: Self-pay

## 2019-05-16 DIAGNOSIS — M216X2 Other acquired deformities of left foot: Secondary | ICD-10-CM

## 2019-05-16 DIAGNOSIS — M19079 Primary osteoarthritis, unspecified ankle and foot: Secondary | ICD-10-CM

## 2019-05-16 DIAGNOSIS — M216X1 Other acquired deformities of right foot: Secondary | ICD-10-CM

## 2019-05-16 DIAGNOSIS — M6740 Ganglion, unspecified site: Secondary | ICD-10-CM

## 2019-05-16 DIAGNOSIS — M79672 Pain in left foot: Secondary | ICD-10-CM

## 2019-05-16 DIAGNOSIS — M79671 Pain in right foot: Secondary | ICD-10-CM

## 2019-05-16 MED ORDER — MELOXICAM 15 MG PO TABS: 15.0000 mg | ORAL_TABLET | Freq: Every day | ORAL | 0 refills | Status: DC

## 2019-05-16 NOTE — Progress Notes (Addendum)
  Subjective:  Patient ID: Bruce Hunter, male    DOB: [birth year],  MRN: [id number]  Chief Complaint  Patient presents with  . Foot Problem    Bilateral "knots" on dorsal aspect of feet. Pt states they are painful.  . Foot Problem    Pt states flat feet.  . Nail Problem    Nail trim 1-5 bilateral    43 y.o. male presents with the above complaint. Hx as above.   Review of Systems: Negative except as noted in the HPI. Denies N/V/F/Ch.  Past Medical History:  Diagnosis Date  . Asthma   . Diabetes mellitus without complication ([place])   . Hyperlipidemia   . Hypertension   . Obesity     Current Outpatient Medications:  .  amLODipine (NORVASC) 10 MG tablet, Take 10 mg by mouth daily., Disp: , Rfl:  .  hydrochlorothiazide (HYDRODIURIL) 12.5 MG tablet, Take 12.5 mg by mouth daily., Disp: , Rfl:  .  meloxicam (MOBIC) 15 MG tablet, Take 1 tablet (15 mg total) by mouth daily., Disp: 30 tablet, Rfl: 0  Social History   Tobacco Use  Smoking Status Current Every Day Smoker  . Packs/day: 0.50  . Types: Cigarettes  Smokeless Tobacco Never Used    No Known Allergies Objective:  There were no vitals filed for this visit. There is no height or weight on file to calculate BMI. Constitutional Well developed. Well nourished.  Vascular Dorsalis pedis pulses palpable bilaterally. Posterior tibial pulses palpable bilaterally. Capillary refill normal to all digits.  No cyanosis or clubbing noted. Pedal hair growth normal.  Neurologic Normal speech. Oriented to person, place, and time. Epicritic sensation to light touch grossly present bilaterally.  Dermatologic Nails well groomed and normal in appearance. No open wounds. No skin lesions.  Orthopedic: POP bilat midfoot ST mass dorsal aspect right ankle, mobe, intimate to extensor tendons measure approx 2 cm ST mass lateral left ankle, intimate to tendons. Measuring approx 1 cm   Radiographs: Taken and reviwed no acute fracture or  dislocation. Assessment:   1. Arthritis of midfoot   2. Ganglion cyst    Plan:  Patient was evaluated and treated and all questions answered.  Midfoot arthritis bilat -Injection delivered bilat -XR reviewed  Ganglion Cysts bilat -Order ST [place] bilat  DM without Complication, not Onychomycosis -Does not qualify for foot care  Return in about 6 weeks (around 06/27/2019).

## 2019-05-17 ENCOUNTER — Telehealth: Payer: Self-pay | Admitting: *Deleted

## 2019-05-17 DIAGNOSIS — M19079 Primary osteoarthritis, unspecified ankle and foot: Secondary | ICD-10-CM

## 2019-05-17 DIAGNOSIS — M6740 Ganglion, unspecified site: Secondary | ICD-10-CM

## 2019-05-17 NOTE — Telephone Encounter (Signed)
-----   Message from Evelina Bucy, DPM sent at 05/16/2019  5:22 PM EDT ----- Can we order Soft tissue ultrasound bilat feet for ganglion cysts

## 2019-06-04 NOTE — Telephone Encounter (Signed)
[facility] - MEDICAID NOTIFICATION FOR [id number] RIGHT AND LEFT US SOFT TISSUE NON-VASCULAR, FOR GANGLION CYSTS AND ARTHRITIS, AUTHORIZATION:  [id number], EFFECTIVE:  06/04/2019, END:  12/01/2019. Faxed to [facility].

## 2019-06-13 ENCOUNTER — Other Ambulatory Visit: Payer: Medicaid Other

## 2019-06-18 ENCOUNTER — Other Ambulatory Visit: Payer: Self-pay | Admitting: Podiatry

## 2019-06-19 ENCOUNTER — Other Ambulatory Visit: Payer: Self-pay

## 2019-06-19 MED ORDER — MELOXICAM 15 MG PO TABS: 15.0000 mg | ORAL_TABLET | Freq: Every day | ORAL | 0 refills | Status: DC

## 2019-06-19 NOTE — Telephone Encounter (Signed)
Refilled Meloxicam okay by Dr. Cannon Kettle

## 2019-06-27 ENCOUNTER — Other Ambulatory Visit: Payer: Self-pay | Admitting: Podiatry

## 2019-06-27 ENCOUNTER — Ambulatory Visit
Admission: RE | Admit: 2019-06-27 | Discharge: 2019-06-27 | Disposition: A | Discharge status: Home / Self Care | Payer: Medicaid Other | Source: Ambulatory Visit | Attending: Podiatry | Admitting: Podiatry

## 2019-06-27 DIAGNOSIS — M6740 Ganglion, unspecified site: Secondary | ICD-10-CM

## 2019-06-27 DIAGNOSIS — M19079 Primary osteoarthritis, unspecified ankle and foot: Secondary | ICD-10-CM

## 2019-06-27 IMAGING — US US EXTREM LOW*L* LIMITED
1 series · 14 of 22 positions shown · non-contrast
Comparison: None.

CLINICAL DATA: Soft tissue swelling on the dorsum of the midfoot.
Gout.

EXAM:
ULTRASOUND LEFT LOWER EXTREMITY LIMITED
TECHNIQUE: Ultrasound examination of the lower extremity soft tissues was
performed in the area of clinical concern.

[Series 1: us extrem low*left* limited · 0.06mm/px · 22 acquisitions, 14 frames shown]
[im 1/22]
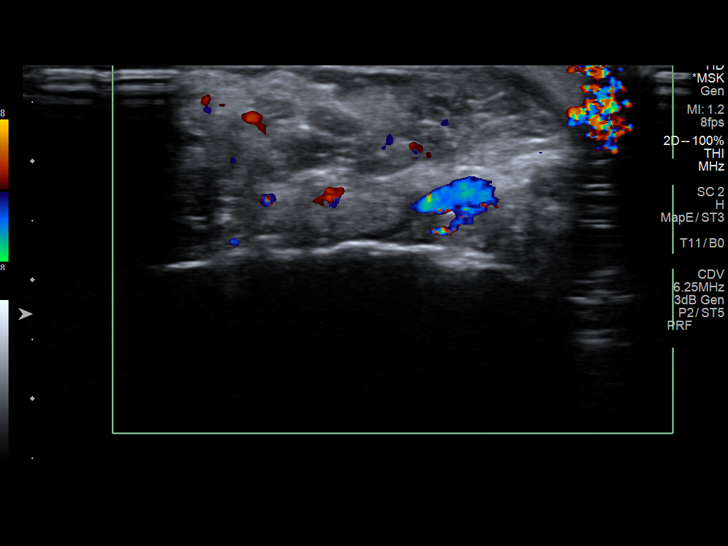
[im 3/22]
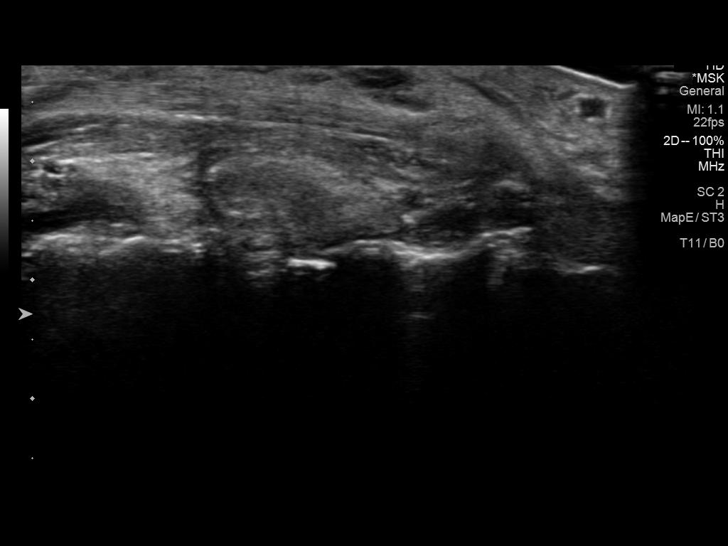
[im 4/22]
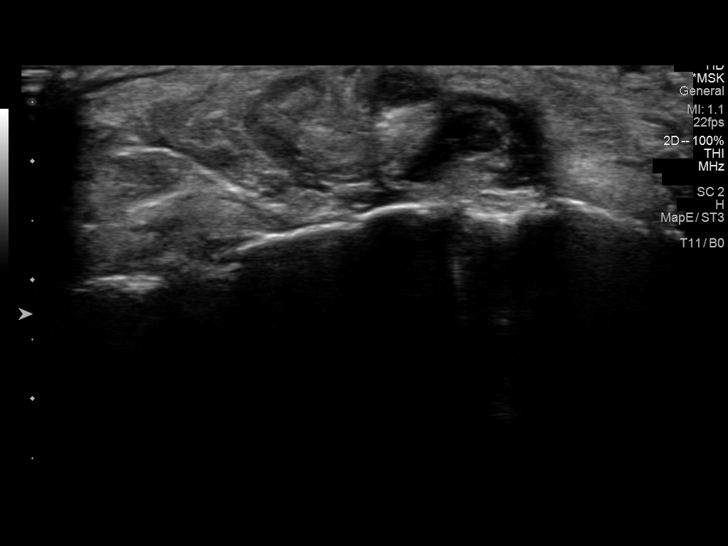
[im 6/22]
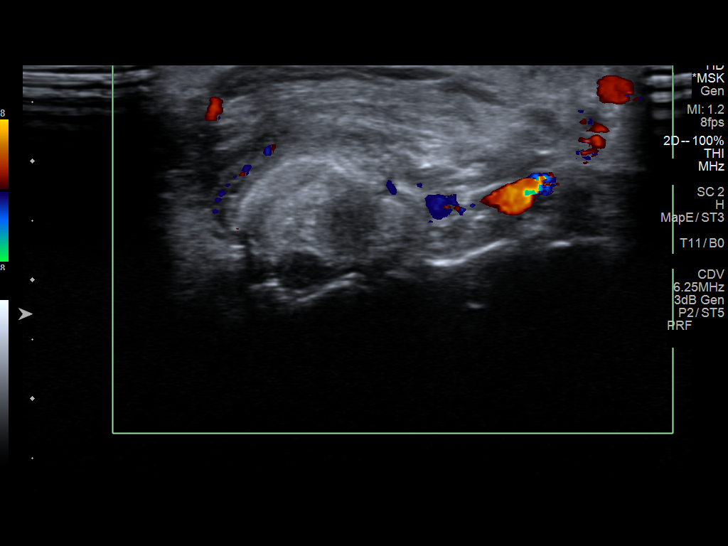
[im 8/22]
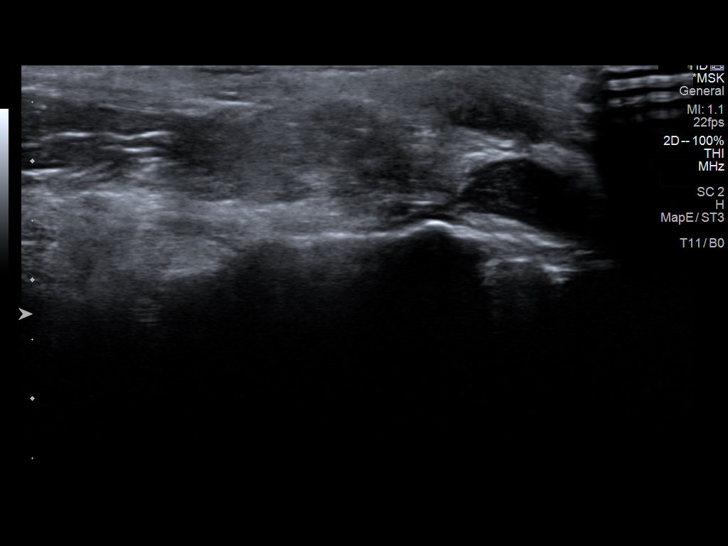
[im 9/22]
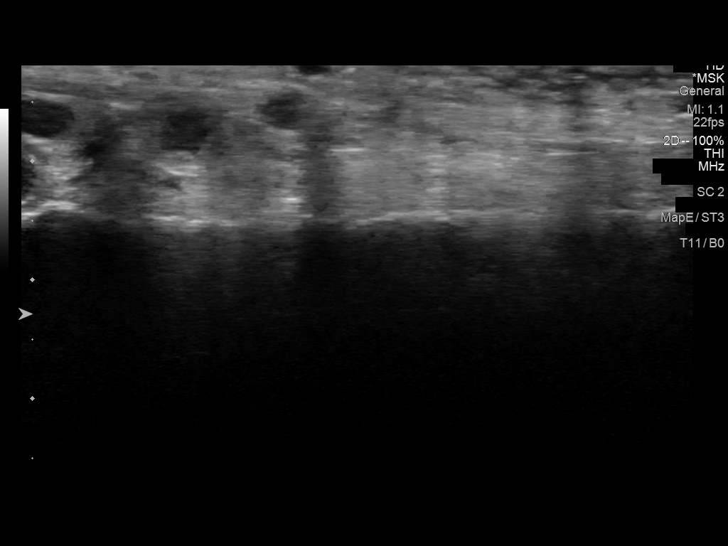
[im 11/22]
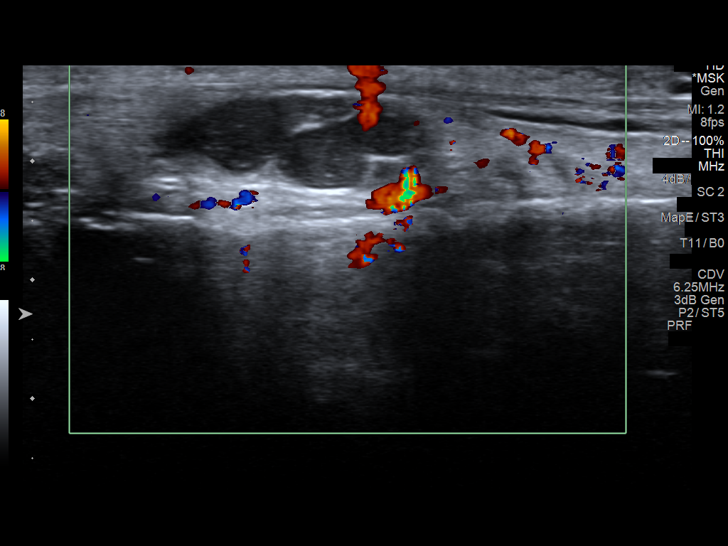
[im 12/22]
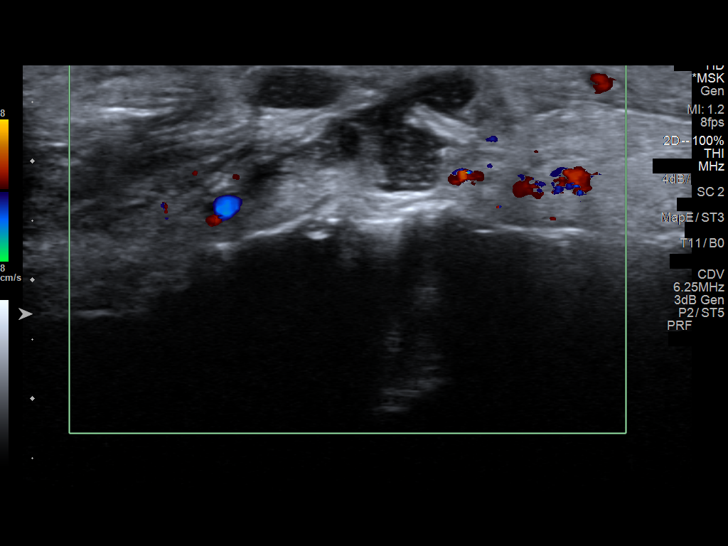
[im 14/22]
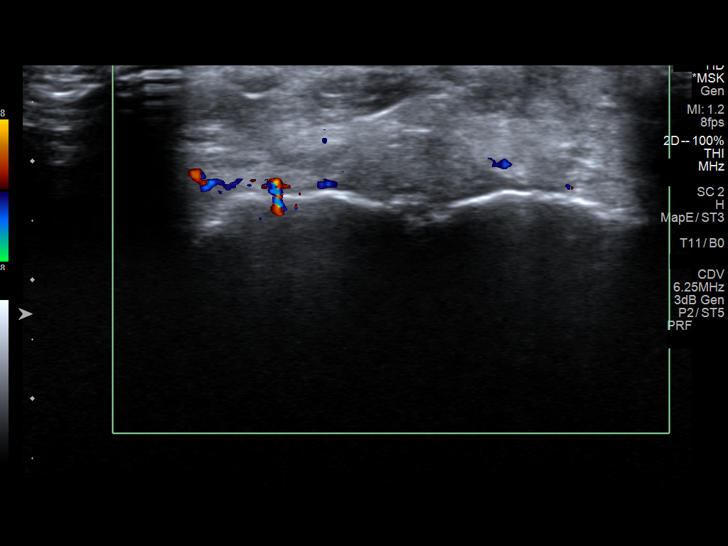
[im 15/22]
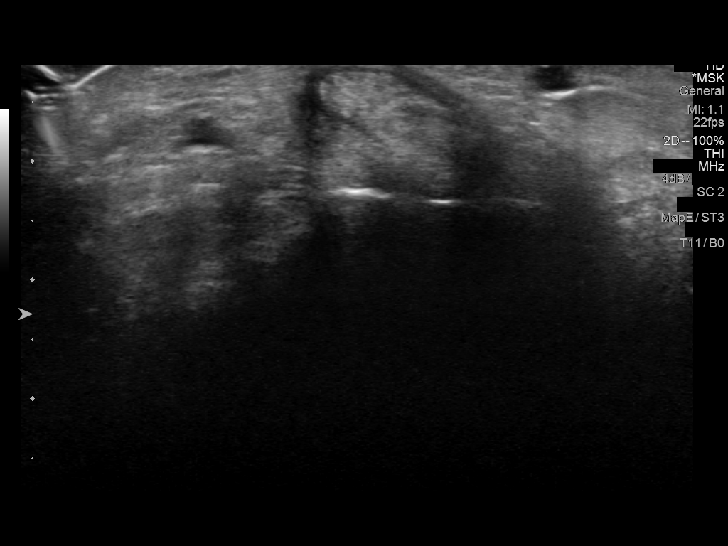
[im 17/22]
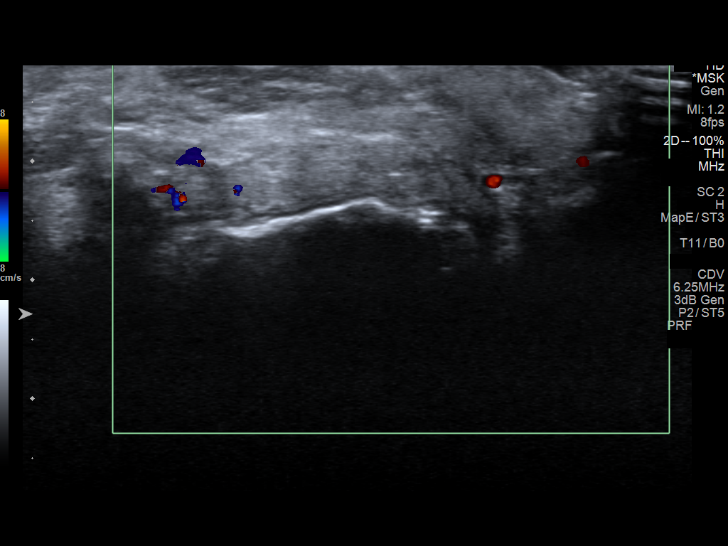
[im 19/22]
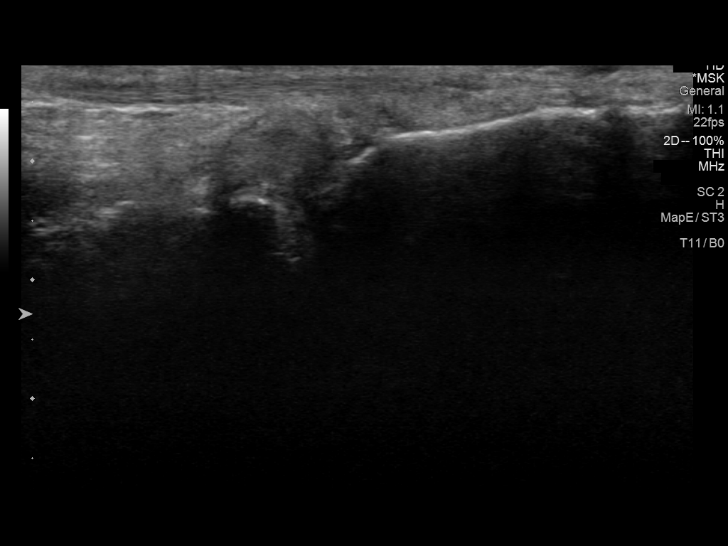
[im 20/22]
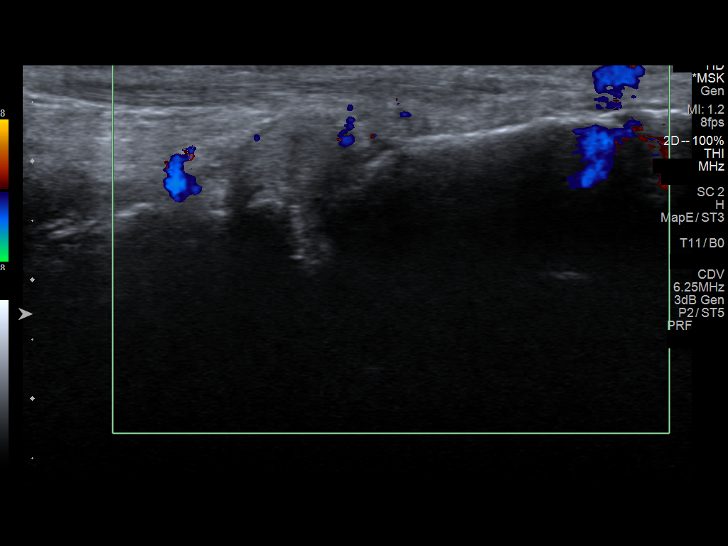
[im 22/22]
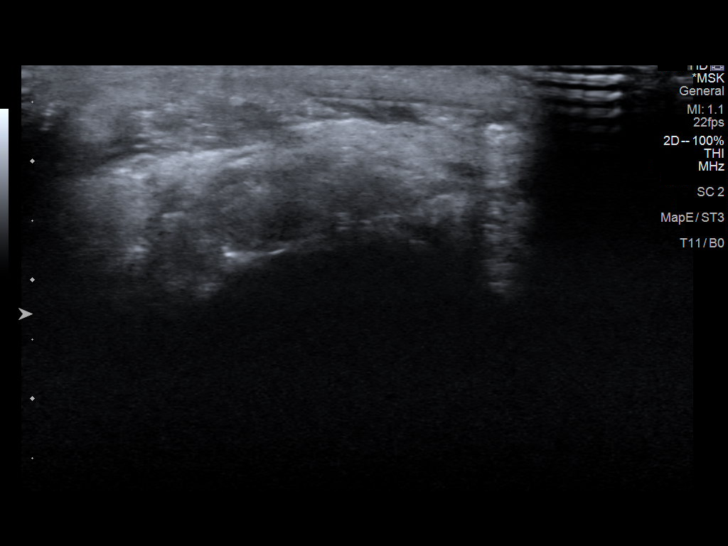

[14 of 22 positions shown; findings below may reference images not displayed]

FINDINGS: I performed realtime ultrasound over the areas of concern on the
dorsal aspect of the LEFT foot. There are multiple lobulated
nonvascular masses deep to the extensor tendons appearing to arise
from the dorsal aspect of the joints of the midfoot. There are small
fluid collections around these masses consistent with ganglion cysts
containing debris. The appearance is classic for gouty tophi.
IMPRESSION: Gouty tophi on the dorsum of the LEFT midfoot, less prominent than
on the right.

## 2019-07-04 ENCOUNTER — Ambulatory Visit: Payer: Medicaid Other | Admitting: Podiatry

## 2019-07-04 ENCOUNTER — Other Ambulatory Visit: Payer: Self-pay

## 2019-07-04 DIAGNOSIS — M216X2 Other acquired deformities of left foot: Secondary | ICD-10-CM

## 2019-07-04 DIAGNOSIS — M216X1 Other acquired deformities of right foot: Secondary | ICD-10-CM

## 2019-07-04 DIAGNOSIS — E1142 Type 2 diabetes mellitus with diabetic polyneuropathy: Secondary | ICD-10-CM | POA: Diagnosis not present

## 2019-07-04 DIAGNOSIS — B351 Tinea unguium: Secondary | ICD-10-CM | POA: Diagnosis not present

## 2019-07-04 DIAGNOSIS — M19079 Primary osteoarthritis, unspecified ankle and foot: Secondary | ICD-10-CM

## 2019-07-04 NOTE — Progress Notes (Signed)
  Subjective:  Patient ID: Bruce Hunter, male    DOB: 21-Aug-1976,  MRN: [id number]  Chief Complaint  Patient presents with  . Foot Pain    Pt states still has bilateral painful knots on dorsal/lateral aspects of feet.  . Foot Problem    Pt states dry cracked skin on bilateral plantar heels.    43 y.o. male presents with the above complaint. Hx as above. Complains of elongated nails as well.   Review of Systems: Negative except as noted in the HPI. Denies N/V/F/Ch.  Past Medical History:  Diagnosis Date  . Asthma   . Diabetes mellitus without complication ([place])   . Hyperlipidemia   . Hypertension   . Obesity     Current Outpatient Medications:  .  amLODipine (NORVASC) 10 MG tablet, Take 10 mg by mouth daily., Disp: , Rfl:  .  gabapentin (NEURONTIN) 300 MG capsule, Take 300 mg by mouth 3 (three) times daily., Disp: , Rfl:  .  hydrochlorothiazide (HYDRODIURIL) 12.5 MG tablet, Take 12.5 mg by mouth daily., Disp: , Rfl:  .  meloxicam (MOBIC) 15 MG tablet, TAKE 1 TABLET(15 MG) BY MOUTH DAILY, Disp: 90 tablet, Rfl: 0 .  meloxicam (MOBIC) 15 MG tablet, Take 1 tablet (15 mg total) by mouth daily., Disp: 30 tablet, Rfl: 0 .  metFORMIN (GLUCOPHAGE) 500 MG tablet, Take by mouth 2 (two) times daily with a meal., Disp: , Rfl:  .  traMADol (ULTRAM) 50 MG tablet, Take by mouth every 6 (six) hours as needed., Disp: , Rfl:   Social History   Tobacco Use  Smoking Status Current Every Day Smoker  . Packs/day: 0.50  . Types: Cigarettes  Smokeless Tobacco Never Used    No Known Allergies Objective:  There were no vitals filed for this visit. There is no height or weight on file to calculate BMI. Constitutional Well developed. Well nourished.  Vascular Dorsalis pedis pulses palpable bilaterally. Posterior tibial pulses palpable bilaterally. Capillary refill normal to all digits.  No cyanosis or clubbing noted. Pedal hair growth normal.  Neurologic Normal speech. Oriented to person,  place, and time. Epicritic sensation to light touch grossly present bilaterally.  Dermatologic Nails well groomed and normal in appearance. No open wounds. No skin lesions.  Orthopedic: POP dorsal midfeet at the gouty tophus.   Radiographs: none Assessment:   1. Arthritis of midfoot   2. 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  Gouty Tophus Bilat -[place] reviewed s/o gouty tophi however likely arthritic component as well -Injections delivered to both areas  DM with DPN -Nails debrided x10  Procedure: Nail Debridement Rationale: Patient meets criteria for routine foot care due to DPN Type of Debridement: manual, sharp debridement. Instrumentation: Nail nipper, rotary burr. Number of Nails: 10   Procedure: Joint Injection Location: Bilateral dorsal TMT joint Skin Prep: Alcohol. Injectate: 0.5 cc 1% lidocaine plain, 0.5 cc dexamethasone phosphate. Disposition: Patient tolerated procedure well. Injection site dressed with a band-aid.  Return in about 6 weeks (around 08/15/2019) for Gout f/u after injection .

## 2019-08-15 ENCOUNTER — Ambulatory Visit: Payer: Medicaid Other | Admitting: Podiatry

## 2019-08-16 ENCOUNTER — Ambulatory Visit: Payer: Medicaid Other | Admitting: Podiatry

## 2019-08-16 ENCOUNTER — Other Ambulatory Visit: Payer: Self-pay

## 2019-08-16 DIAGNOSIS — M7751 Other enthesopathy of right foot: Secondary | ICD-10-CM | POA: Diagnosis not present

## 2019-08-16 DIAGNOSIS — M19079 Primary osteoarthritis, unspecified ankle and foot: Secondary | ICD-10-CM

## 2019-08-16 DIAGNOSIS — M7752 Other enthesopathy of left foot: Secondary | ICD-10-CM

## 2019-08-16 NOTE — Progress Notes (Signed)
n

## 2019-08-16 NOTE — Progress Notes (Signed)
  Subjective:  Patient ID: Bruce Hunter, male    DOB: [birth year],  MRN: [id number]  Chief Complaint  Patient presents with  . Gout    Follow-up - bilateral midfoot. Pt stated, "When I wake up and stand up, it hurts so bad. The 'knots' hurt - it's like something is growing under my skin. My PCP gave me an NSAID". Pt is calling [facility] to verify the name of the medication.    43 y.o. male presents with the above complaint. Hx as above. Complains of elongated nails as well.   Review of Systems: Negative except as noted in the HPI. Denies N/V/F/Ch.  Past Medical History:  Diagnosis Date  . Asthma   . Diabetes mellitus without complication ([place])   . Hyperlipidemia   . Hypertension   . Obesity     Current Outpatient Medications:  .  amLODipine (NORVASC) 10 MG tablet, Take 10 mg by mouth daily., Disp: , Rfl:  .  gabapentin (NEURONTIN) 300 MG capsule, Take 300 mg by mouth 3 (three) times daily., Disp: , Rfl:  .  hydrochlorothiazide (HYDRODIURIL) 12.5 MG tablet, Take 12.5 mg by mouth daily., Disp: , Rfl:  .  meloxicam (MOBIC) 15 MG tablet, Take 1 tablet (15 mg total) by mouth daily., Disp: 30 tablet, Rfl: 0 .  metFORMIN (GLUCOPHAGE) 500 MG tablet, Take by mouth 2 (two) times daily with a meal., Disp: , Rfl:  .  traMADol (ULTRAM) 50 MG tablet, Take by mouth every 6 (six) hours as needed., Disp: , Rfl:   Social History   Tobacco Use  Smoking Status Current Every Day Smoker  . Packs/day: 0.50  . Types: Cigarettes  Smokeless Tobacco Never Used    No Known Allergies Objective:  There were no vitals filed for this visit. There is no height or weight on file to calculate BMI. Constitutional Well developed. Well nourished.  Vascular Dorsalis pedis pulses palpable bilaterally. Posterior tibial pulses palpable bilaterally. Capillary refill normal to all digits.  No cyanosis or clubbing noted. Pedal hair growth normal.  Neurologic Normal speech. Oriented to person, place, and  time. Epicritic sensation to light touch grossly present bilaterally.  Dermatologic Nails well groomed and normal in appearance. No open wounds. No skin lesions.  Orthopedic: POP dorsal midfeet at the gouty tophus.   Radiographs: none Assessment:   1. Arthritis of midfoot    Plan:  Patient was evaluated and treated and all questions answered.  Gouty Tophus Bilat -Repeat injection as below -Would consider surgical intervention if pain persists assuming A1c is adequately controlled  Procedure: Joint Injection Location: Bilateral dorsal TMT joint Skin Prep: Alcohol. Injectate: 0.5 cc 1% lidocaine plain, 0.5 cc dexamethasone phosphate. Disposition: Patient tolerated procedure well. Injection site dressed with a band-aid.    Return in about 1 month (around 09/16/2019) for Gout tophi / arthritis f/u .

## 2019-09-13 ENCOUNTER — Ambulatory Visit: Payer: Medicaid Other | Admitting: Podiatry

## 2019-09-13 ENCOUNTER — Other Ambulatory Visit: Payer: Self-pay

## 2019-09-13 ENCOUNTER — Encounter: Payer: Self-pay | Admitting: Podiatry

## 2019-09-13 DIAGNOSIS — E1142 Type 2 diabetes mellitus with diabetic polyneuropathy: Secondary | ICD-10-CM

## 2019-09-13 DIAGNOSIS — M6740 Ganglion, unspecified site: Secondary | ICD-10-CM

## 2019-09-13 DIAGNOSIS — B351 Tinea unguium: Secondary | ICD-10-CM | POA: Diagnosis not present

## 2019-09-13 DIAGNOSIS — M7751 Other enthesopathy of right foot: Secondary | ICD-10-CM

## 2019-09-13 DIAGNOSIS — M79676 Pain in unspecified toe(s): Secondary | ICD-10-CM

## 2019-09-13 DIAGNOSIS — M898X9 Other specified disorders of bone, unspecified site: Secondary | ICD-10-CM

## 2019-09-13 DIAGNOSIS — M19079 Primary osteoarthritis, unspecified ankle and foot: Secondary | ICD-10-CM

## 2019-09-13 DIAGNOSIS — M7752 Other enthesopathy of left foot: Secondary | ICD-10-CM | POA: Diagnosis not present

## 2019-09-13 DIAGNOSIS — M1A9XX1 Chronic gout, unspecified, with tophus (tophi): Secondary | ICD-10-CM

## 2019-09-13 NOTE — Patient Instructions (Signed)
Arthritis causing bone spurs Also has a cyst in the front of the right ankle The arthritis is also possibly worse from gout crystals which are caused by breakdown of red meat and shellfish.

## 2019-09-13 NOTE — Progress Notes (Signed)
  Subjective:  Patient ID: Bruce Hunter, male    DOB: [birth year],  MRN: [id number]  Chief Complaint  Patient presents with  . Gout    BL follow up; "still painful, meds not working; here for nail and callous trim today"    44 y.o. male presents with the above complaint. History confirmed with patient.   Objective:  Physical Exam: warm, good capillary refill, nail exam onychomycosis of the toenails, no trophic changes or ulcerative lesions, normal DP and PT pulses, and diminished sensation Left Foot: pain at lateral 4th/5th TMT  Right Foot: pain at dorsal ankle with fluctuant mass, pain over 2nd TMT   No images are attached to the encounter.  Assessment:   1. Onychomycosis of multiple toenails with type 2 diabetes mellitus and peripheral neuropathy ([place])   2. Arthritis of midfoot   3. Bony exostosis   4. Gouty tophi   5. Ganglion cyst      Plan:  Patient was evaluated and treated and all questions answered.  Diabetes and DPN -Patient is diabetic with a qualifying condition for at risk foot care.  Procedure: Nail Debridement Rationale: Patient meets criteria for routine foot care due to DPN Type of Debridement: manual, sharp debridement. Instrumentation: Nail nipper, rotary burr. Number of Nails: 10  Arthritis, Ganglion Cyst -Educated on etiology -Injection delivered to the painful joint  -Discussed with patient surgical options. Discussed risks and benefits and surgical treatment outline. Patient would like to think it over. He is at higher risk for surgery.  Procedure: Joint Injection Location: right 4th/5th TMT  Skin Prep: Alcohol. Injectate: 0.5 cc 1% lidocaine plain, 0.5 cc dexamethasone phosphate. Disposition: Patient tolerated procedure well. Injection site dressed with a band-aid.  Procedure: Cyst Injection Location: Right ganglion cyst joint Skin Prep: Alcohol. Injectate: 0.5 cc 1% lidocaine plain, 0.5 cc dexamethasone phosphate. Disposition: Patient  tolerated procedure well. Injection site dressed with a band-aid.  Return in about 6 weeks (around 10/25/2019) for Arthritis, cyst f/u, possible surgery planning.   MDM

## 2019-10-25 ENCOUNTER — Ambulatory Visit: Payer: Medicaid Other | Admitting: Podiatry

## 2019-10-25 ENCOUNTER — Other Ambulatory Visit: Payer: Self-pay

## 2019-10-25 DIAGNOSIS — M19079 Primary osteoarthritis, unspecified ankle and foot: Secondary | ICD-10-CM

## 2019-10-25 DIAGNOSIS — M1A9XX1 Chronic gout, unspecified, with tophus (tophi): Secondary | ICD-10-CM

## 2019-10-25 DIAGNOSIS — M216X1 Other acquired deformities of right foot: Secondary | ICD-10-CM

## 2019-10-25 DIAGNOSIS — M6740 Ganglion, unspecified site: Secondary | ICD-10-CM

## 2019-10-25 NOTE — Progress Notes (Signed)
Sent the MRI order to the [facility] today and sent Ricardo-cma a message as well. Lattie Haw

## 2019-10-28 ENCOUNTER — Telehealth: Payer: Self-pay | Admitting: *Deleted

## 2019-10-28 DIAGNOSIS — M6740 Ganglion, unspecified site: Secondary | ICD-10-CM

## 2019-10-28 NOTE — Telephone Encounter (Signed)
-----   Message from Evelina Bucy, DPM sent at 10/25/2019  2:32 PM EST ----- Can we order MRI right foot for ganglion cyst?

## 2019-11-06 NOTE — Telephone Encounter (Signed)
[facility] - Bruce Hunter states pt's MRI is scheduled [id number], needs pre-cert.

## 2019-11-07 NOTE — Telephone Encounter (Signed)
[facility] - Medicaid request clinicals for review prior to pre-cert for MRI 0000000 Right Foot w/wo contrast CASE: [id number]. Faxed orders, clinicals 10/25/2019, 09/13/2019 and 05/16/2019, and demographics to [facility].

## 2019-11-07 NOTE — Progress Notes (Signed)
  Subjective:  Patient ID: Bruce Hunter, male    DOB: Jun 24, 1976,  MRN: [id number]  Chief Complaint  Patient presents with  . Arthritis    Pt states generalized foot pain. Pt states injection did not help.  . Cyst    Pt states injection did not help.    44 y.o. male presents with the above complaint. History confirmed with patient.   Objective:  Physical Exam: warm, good capillary refill, nail exam onychomycosis of the toenails, no trophic changes or ulcerative lesions, normal DP and PT pulses, and diminished sensation Left Foot: pain at lateral 4th/5th TMT  Right Foot: pain at dorsal ankle with fluctuant mass, pain over 2nd TMT   No images are attached to the encounter.  Assessment:   1. Ganglion cyst   2. Gouty tophi   3. Arthritis of midfoot      Plan:  Patient was evaluated and treated and all questions answered.  Arthritis, Ganglion Cyst -Still having significant pain. Would like to possibly discuss surgical planning. Order MRI for surgical planning for removal of cyst right ankle. -Has failed injections and shoe gear changes  Previous US showed size as 2.4 cm lobulated cyst. Will plan for possible surgical discussion after MRI.  No follow-ups on file.

## 2019-11-07 NOTE — Telephone Encounter (Signed)
Done

## 2019-11-18 NOTE — Telephone Encounter (Signed)
[facility] - MEDICAID AUTHORIZATION:  [id number] FOR CASE: [id number] RIGHT FOOT MRI WITH AND WITHOUT CONTRAST, VALID:  11/07/2019, END:  05/05/2020. Faxed to [facility] with orders.

## 2019-11-27 ENCOUNTER — Other Ambulatory Visit: Payer: Medicaid Other

## 2019-12-12 ENCOUNTER — Other Ambulatory Visit: Payer: Self-pay

## 2019-12-12 ENCOUNTER — Ambulatory Visit: Payer: Medicaid Other | Admitting: Podiatry

## 2019-12-12 VITALS — Temp 96.3°F

## 2019-12-12 DIAGNOSIS — M216X1 Other acquired deformities of right foot: Secondary | ICD-10-CM | POA: Diagnosis not present

## 2019-12-12 DIAGNOSIS — M6740 Ganglion, unspecified site: Secondary | ICD-10-CM

## 2019-12-12 DIAGNOSIS — M19079 Primary osteoarthritis, unspecified ankle and foot: Secondary | ICD-10-CM

## 2019-12-12 DIAGNOSIS — M1A9XX1 Chronic gout, unspecified, with tophus (tophi): Secondary | ICD-10-CM

## 2019-12-12 MED ORDER — MELOXICAM 15 MG PO TABS: 15.0000 mg | ORAL_TABLET | Freq: Every day | ORAL | 0 refills | Status: DC

## 2019-12-12 NOTE — Progress Notes (Signed)
  Subjective:  Patient ID: Bruce Hunter, male    DOB: [birth year],  MRN: [id number]  Chief Complaint  Patient presents with  . Follow-up    Bilateral foot pain - arthritis. Pt stated, "The pain has not improved. 11/10 most of the time. Some days are better. The gabapentin and Tramadol aren't helping much. I'm willing to consider surgery if that would help".    44 y.o. male presents with the above complaint. History confirmed with patient.   Objective:  Physical Exam: warm, good capillary refill, nail exam onychomycosis of the toenails, no trophic changes or ulcerative lesions, normal DP and PT pulses, and diminished sensation Left Foot: pain at lateral 4th/5th TMT  Right Foot: pain at dorsal ankle with fluctuant mass, pain over 2nd TMT   No images are attached to the encounter.  Assessment:   1. Ganglion cyst   2. Gouty tophi   3. Arthritis of midfoot      Plan:  Patient was evaluated and treated and all questions answered.  Arthritis, Ganglion Cyst -Missed appt for MRI -Hold injection today. -Will get MRI rescheduled. -F/u after MRI -Refill meloxicam  No follow-ups on file.

## 2019-12-13 ENCOUNTER — Telehealth: Payer: Self-pay | Admitting: *Deleted

## 2019-12-13 NOTE — Telephone Encounter (Signed)
-----   Message from Evelina Bucy, DPM sent at 12/12/2019  4:21 PM EDT ----- Pt missed appt with MRI can we see if he can get scheduled

## 2019-12-13 NOTE — Telephone Encounter (Signed)
I informed pt of [facility] [phone] to reschedule.

## 2020-01-08 ENCOUNTER — Other Ambulatory Visit: Payer: Self-pay

## 2020-01-08 ENCOUNTER — Ambulatory Visit
Admission: RE | Admit: 2020-01-08 | Discharge: 2020-01-08 | Disposition: A | Discharge status: Home / Self Care | Payer: Medicaid Other | Source: Ambulatory Visit | Attending: Podiatry | Admitting: Podiatry

## 2020-01-08 DIAGNOSIS — M6740 Ganglion, unspecified site: Secondary | ICD-10-CM

## 2020-01-08 IMAGING — MR MR FOOT*R* WO/W CM
6 of 8 series · 30 of 40 positions shown · IV contrast (multihance)
Comparison: Ultrasound [DATE]

CLINICAL DATA: Dorsal foot mass.

EXAM:
MRI OF THE RIGHT FOREFOOT WITHOUT AND WITH CONTRAST
TECHNIQUE: Multiplanar, multisequence MR imaging of the right foot was
performed before and after the administration of intravenous
contrast.
CONTRAST:  20mL MULTIHANCE GADOBENATE DIMEGLUMINE 529 MG/ML IV SOLN

[Series 4: T1 · coronal · 3.0mm · 0.26mm/px · 6 of 56 slices shown (1 of 2)]
[im 1/56]
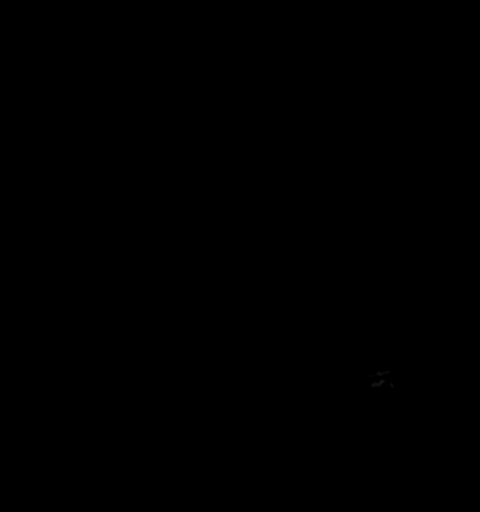
[im 12/56]
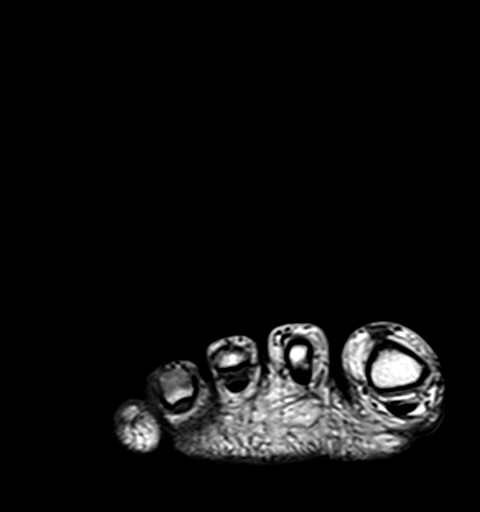
[im 23/56]
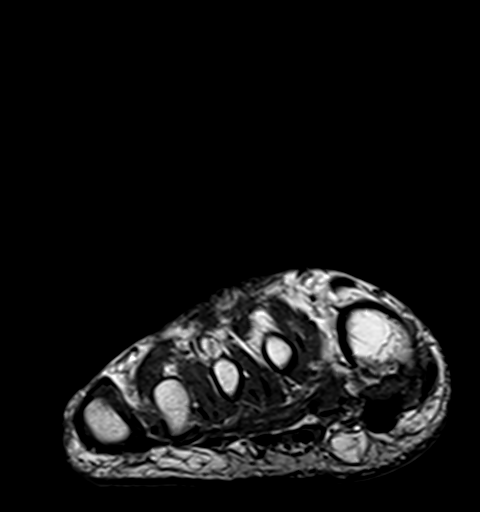
[im 34/56]
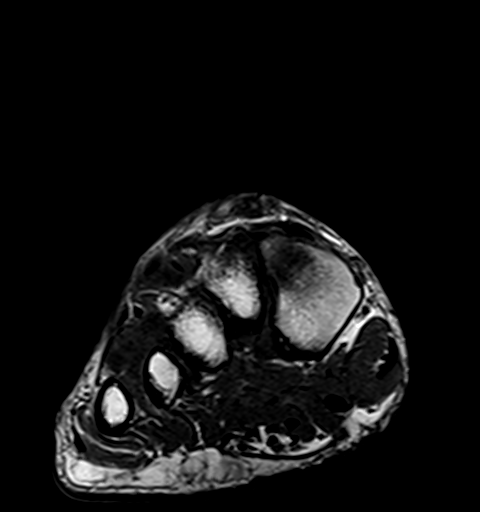
[im 45/56]
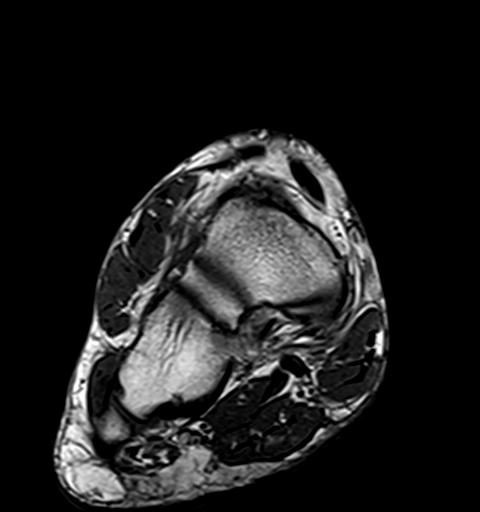
[im 56/56]
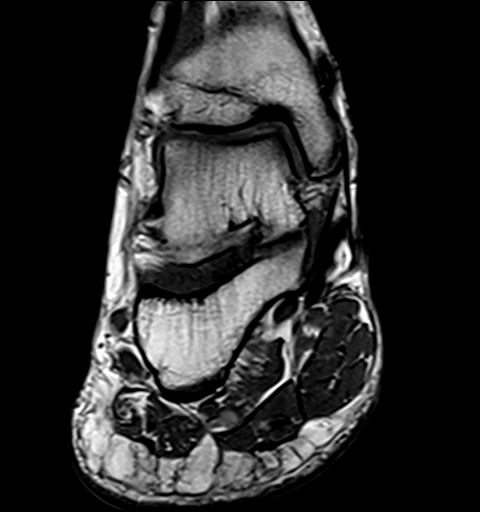

[Series 5: T2 fat-sat · coronal · 3.0mm · 0.26mm/px · 7 of 56 slices shown (1 of 2)]
[im 1/56]
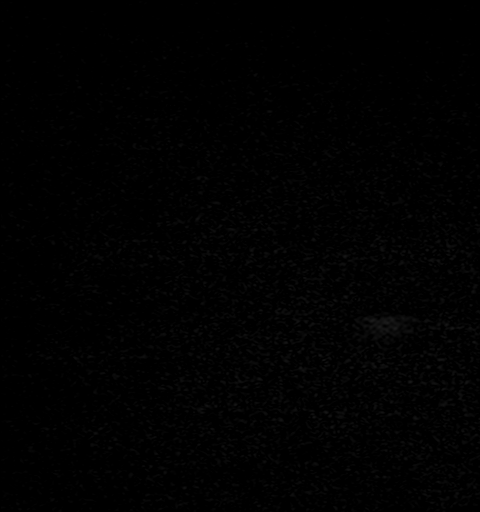
[im 10/56]
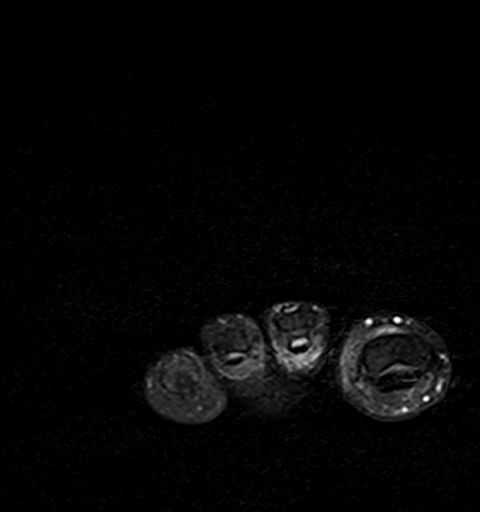
[im 19/56]
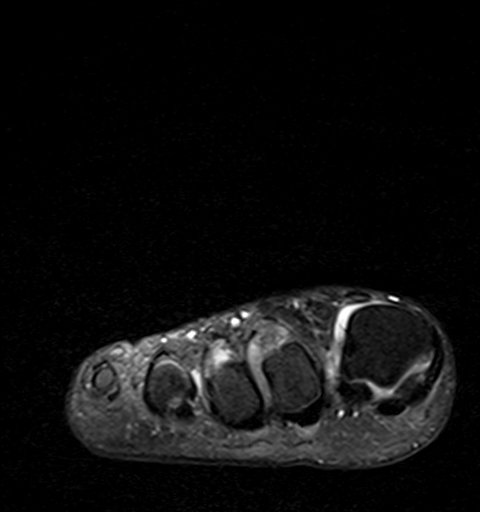
[im 28/56]
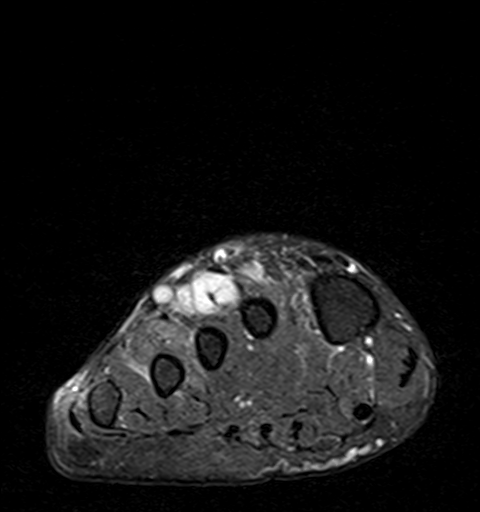
[im 37/56]
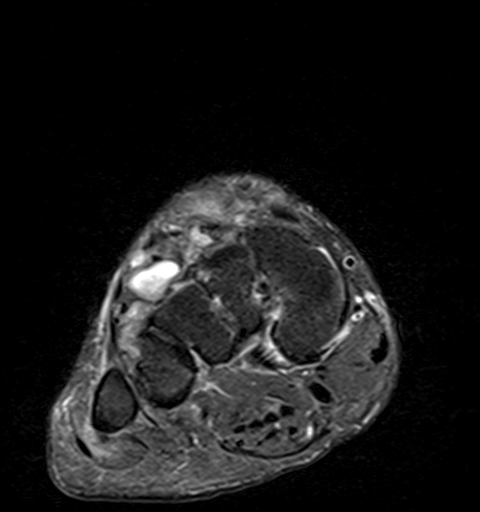
[im 46/56]
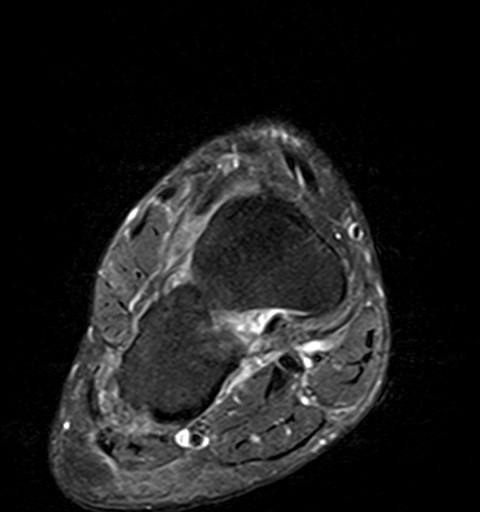
[im 56/56]
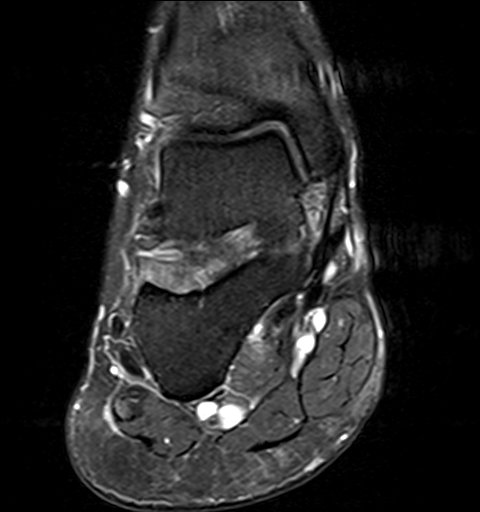

[Series 6: T2 fat-sat · axial · 3.0mm · 0.47mm/px · z∈[-116,+6]mm · 4 of 33 slices shown (2 of 2)]
[im 1/33]
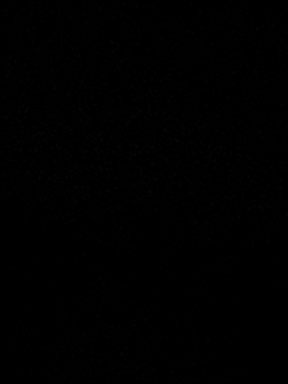
[im 11/33]
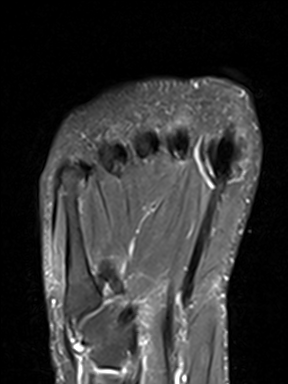
[im 22/33]
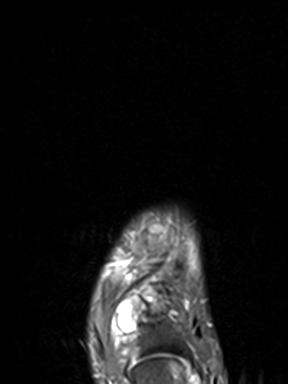
[im 33/33]
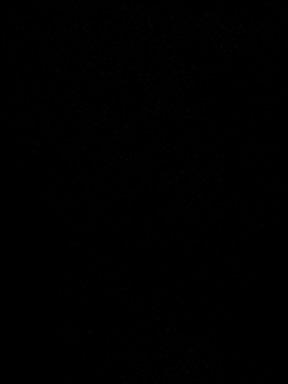

[Series 7: T1 · axial · 3.0mm · 0.35mm/px · z∈[-116,+6]mm · 4 of 33 slices shown (2 of 2)]
[im 1/33]
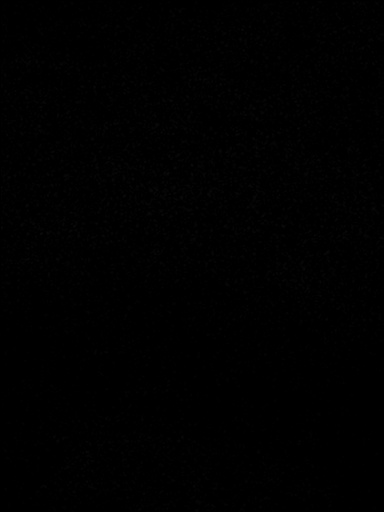
[im 11/33]
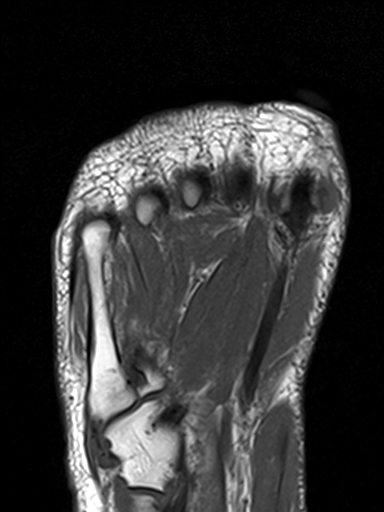
[im 22/33]
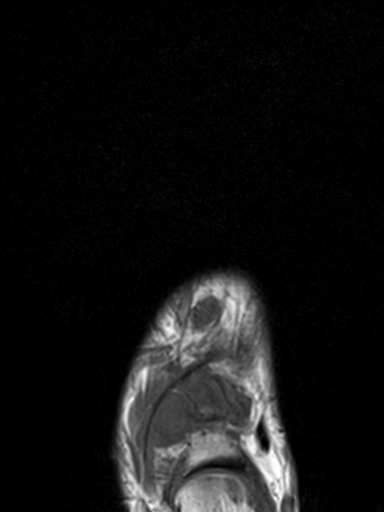
[im 33/33]
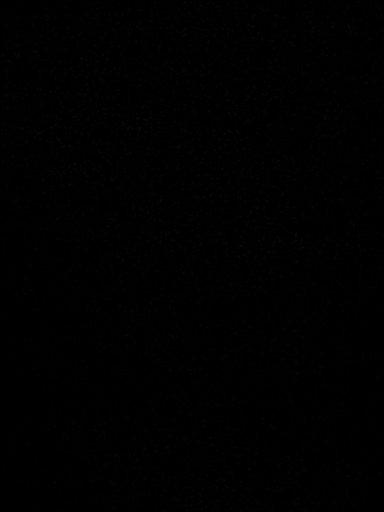

[Series 9: T1 fat-sat · coronal · non-contrast · 3.0mm · 0.69mm/px · 7 of 56 slices shown]
[im 1/56]
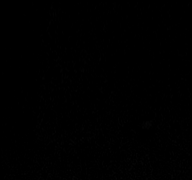
[im 10/56]
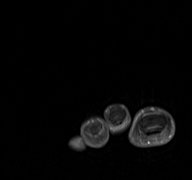
[im 19/56]
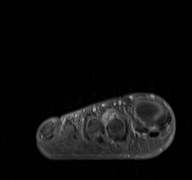
[im 28/56]
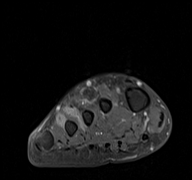
[im 37/56]
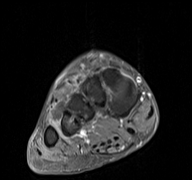
[im 46/56]
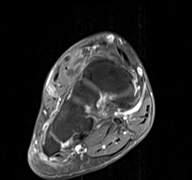
[im 56/56]
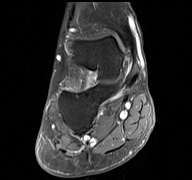

[Series 10: T1 fat-sat post-contrast · axial · 3.0mm · 0.47mm/px · z∈[-116,-78]mm · 2 of 32 slices shown]
[im 1/32]
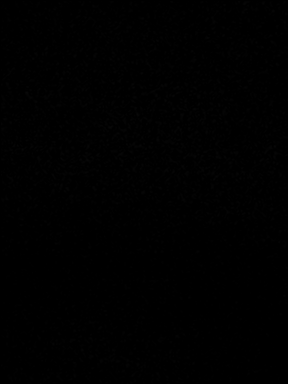
[im 11/32]
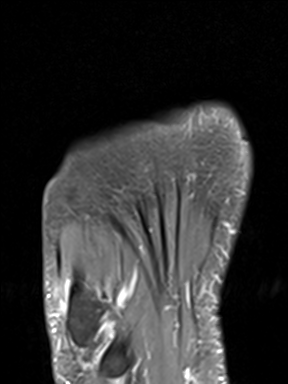

[30 of 40 positions shown; findings below may reference images not displayed]

FINDINGS: Complex process involving the dorsum of the midfoot. There are multi
septated cystic lesions noted on the dorsum of the foot at the level
of the tarsal metatarsal joints which demonstrate typical rim like
synovial enhancement and most consistent with ganglion cyst.
Adjacent to this area is enhancing soft tissue most notably along
the extensor tendons over the second metatarsal and between the
first and second metatarsals and then along the dorsum of the fourth
metatarsal and continuing down into the forefoot. Second cluster of
rim enhancing multi septated cysts overlying the third metatarsal
shaft more distally.

Patient has a history of gout and there are degenerative changes
involving the midfoot bony structures with possible erosions.
Findings most likely due to a combination of tophaceous gout and
ganglion cysts.
IMPRESSION: Complex process involving the dorsum of the foot most likely a
combination of dissecting multi septated ganglion cysts and
tophaceous gout.

## 2020-01-08 MED ORDER — GADOBENATE DIMEGLUMINE 529 MG/ML IV SOLN
20.0000 mL | Freq: Once | INTRAVENOUS | Status: AC | PRN
  Administered 2020-01-08: 16:00:00 20 mL via INTRAVENOUS

## 2020-02-06 ENCOUNTER — Other Ambulatory Visit: Payer: Self-pay

## 2020-02-06 ENCOUNTER — Ambulatory Visit (INDEPENDENT_AMBULATORY_CARE_PROVIDER_SITE_OTHER): Payer: Medicaid Other | Admitting: Podiatry

## 2020-02-06 VITALS — Temp 97.6°F

## 2020-02-06 DIAGNOSIS — M898X9 Other specified disorders of bone, unspecified site: Secondary | ICD-10-CM

## 2020-02-06 DIAGNOSIS — M19079 Primary osteoarthritis, unspecified ankle and foot: Secondary | ICD-10-CM

## 2020-02-06 DIAGNOSIS — M216X1 Other acquired deformities of right foot: Secondary | ICD-10-CM

## 2020-02-06 DIAGNOSIS — E1142 Type 2 diabetes mellitus with diabetic polyneuropathy: Secondary | ICD-10-CM | POA: Diagnosis not present

## 2020-02-06 DIAGNOSIS — M6740 Ganglion, unspecified site: Secondary | ICD-10-CM

## 2020-02-06 DIAGNOSIS — M1A9XX1 Chronic gout, unspecified, with tophus (tophi): Secondary | ICD-10-CM

## 2020-02-06 NOTE — Patient Instructions (Signed)
Pre-Operative Instructions  Congratulations, you have decided to take an important step towards improving your quality of life.  You can be assured that the doctors and staff at [facility] will be with you every step of the way.  Here are some important things you should know:  1. Plan to be at the surgery center/hospital at least 1 (one) hour prior to your scheduled time, unless otherwise directed by the surgical center/hospital staff.  You must have a responsible adult accompany you, remain during the surgery and drive you home.  Make sure you have directions to the surgical center/hospital to ensure you arrive on time. 2. If you are having surgery at [facility] or [facility] hospitals, you will need a copy of your medical history and physical form from your family physician within one month prior to the date of surgery. We will give you a form for your primary physician to complete.  3. We make every effort to accommodate the date you request for surgery.  However, there are times where surgery dates or times have to be moved.  We will contact you as soon as possible if a change in schedule is required.   4. No aspirin/ibuprofen for one week before surgery.  If you are on aspirin, any non-steroidal anti-inflammatory medications (Mobic, Aleve, Ibuprofen) should not be taken seven (7) days prior to your surgery.  You make take Tylenol for pain prior to surgery.  5. Medications - If you are taking daily heart and blood pressure medications, seizure, reflux, allergy, asthma, anxiety, pain or diabetes medications, make sure you notify the surgery center/hospital before the day of surgery so they can tell you which medications you should take or avoid the day of surgery. 6. No food or drink after midnight the night before surgery unless directed otherwise by surgical center/hospital staff. 7. No alcoholic beverages 24-hours prior to surgery.  No smoking 24-hours prior or 24-hours after  surgery. 8. Wear loose pants or shorts. They should be loose enough to fit over bandages, boots, and casts. 9. Don't wear slip-on shoes. Sneakers are preferred. 10. Bring your boot with you to the surgery center/hospital.  Also bring crutches or a walker if your physician has prescribed it for you.  If you do not have this equipment, it will be provided for you after surgery. 11. If you have not been contacted by the surgery center/hospital by the day before your surgery, call to confirm the date and time of your surgery. 12. Leave-time from work may vary depending on the type of surgery you have.  Appropriate arrangements should be made prior to surgery with your employer. 13. Prescriptions will be provided immediately following surgery by your doctor.  Fill these as soon as possible after surgery and take the medication as directed. Pain medications will not be refilled on weekends and must be approved by the doctor. 14. Remove nail polish on the operative foot and avoid getting pedicures prior to surgery. 15. Wash the night before surgery.  The night before surgery wash the foot and leg well with water and the antibacterial soap provided. Be sure to pay special attention to beneath the toenails and in between the toes.  Wash for at least three (3) minutes. Rinse thoroughly with water and dry well with a towel.  Perform this wash unless told not to do so by your physician.  Enclosed: 1 Ice pack (please put in freezer the night before surgery)   1 Hibiclens skin cleaner     Pre-op instructions  If you have any questions regarding the instructions, please do not hesitate to call our office.  [place]: [address], [place], [place] [address] -- [phone]  [place]: [address] [place], [place] [address] -- [phone]  [place]: [address], [place],  [address] -- [phone]   Website: [email or web address] 

## 2020-02-18 ENCOUNTER — Encounter (HOSPITAL_BASED_OUTPATIENT_CLINIC_OR_DEPARTMENT_OTHER): Payer: Self-pay | Admitting: Podiatry

## 2020-02-18 ENCOUNTER — Other Ambulatory Visit: Payer: Self-pay

## 2020-02-18 NOTE — Progress Notes (Signed)
Spoke w/ via phone for pre-op interview--- [place]----  ISTAT8, EKG            Lab results------ COVID test ------02/21/20 Arrive at -------0745 NPO after ------MN Medications to take morning of surgery ----- Amlodipine Diabetic medication -----None Patient Special Instructions ----- Pre-Op special Istructions ----- Patient verbalized understanding of instructions that were given at this phone interview. Patient denies shortness of breath, chest pain, fever, cough a this phone interview.

## 2020-02-22 ENCOUNTER — Other Ambulatory Visit (HOSPITAL_COMMUNITY)
Admission: RE | Admit: 2020-02-22 | Discharge: 2020-02-22 | Disposition: A | Discharge status: Home / Self Care | Payer: Medicaid Other | Source: Ambulatory Visit | Attending: Podiatry | Admitting: Podiatry

## 2020-02-22 DIAGNOSIS — Z01812 Encounter for preprocedural laboratory examination: Secondary | ICD-10-CM | POA: Diagnosis not present

## 2020-02-22 DIAGNOSIS — Z20822 Contact with and (suspected) exposure to covid-19: Secondary | ICD-10-CM | POA: Insufficient documentation

## 2020-02-22 LAB — SARS CORONAVIRUS 2 (TAT 6-24 HRS): SARS Coronavirus 2: NEGATIVE

## 2020-02-24 NOTE — Progress Notes (Signed)
Requested h and p from shelley at dr price office for 02-26-2020 surgery

## 2020-02-24 NOTE — Progress Notes (Signed)
H and P from dr Gala Romney dated 02-24-2020 on chart for 02-26-2020 surgery

## 2020-02-25 NOTE — Progress Notes (Signed)
Spoke with patient by phone, pt aware arrive 530 am [facility]

## 2020-02-26 ENCOUNTER — Ambulatory Visit (HOSPITAL_BASED_OUTPATIENT_CLINIC_OR_DEPARTMENT_OTHER)
Admission: RE | Admit: 2020-02-26 | Discharge: 2020-02-26 | Disposition: A | Discharge status: Home / Self Care | Payer: Medicaid Other | Source: Ambulatory Visit | Attending: Podiatry | Admitting: Podiatry

## 2020-02-26 ENCOUNTER — Ambulatory Visit (HOSPITAL_BASED_OUTPATIENT_CLINIC_OR_DEPARTMENT_OTHER): Payer: Medicaid Other | Admitting: Certified Registered"

## 2020-02-26 ENCOUNTER — Other Ambulatory Visit: Payer: Self-pay

## 2020-02-26 ENCOUNTER — Encounter (HOSPITAL_BASED_OUTPATIENT_CLINIC_OR_DEPARTMENT_OTHER)
Admission: RE | Disposition: A | Discharge status: Home / Self Care | Payer: Self-pay | Source: Ambulatory Visit | Attending: Podiatry

## 2020-02-26 ENCOUNTER — Encounter (HOSPITAL_BASED_OUTPATIENT_CLINIC_OR_DEPARTMENT_OTHER): Payer: Self-pay | Admitting: Podiatry

## 2020-02-26 DIAGNOSIS — E119 Type 2 diabetes mellitus without complications: Secondary | ICD-10-CM | POA: Insufficient documentation

## 2020-02-26 DIAGNOSIS — M7751 Other enthesopathy of right foot: Secondary | ICD-10-CM

## 2020-02-26 DIAGNOSIS — M1A9XX1 Chronic gout, unspecified, with tophus (tophi): Secondary | ICD-10-CM | POA: Diagnosis not present

## 2020-02-26 DIAGNOSIS — M67472 Ganglion, left ankle and foot: Secondary | ICD-10-CM | POA: Insufficient documentation

## 2020-02-26 DIAGNOSIS — G8929 Other chronic pain: Secondary | ICD-10-CM | POA: Insufficient documentation

## 2020-02-26 DIAGNOSIS — I10 Essential (primary) hypertension: Secondary | ICD-10-CM | POA: Diagnosis not present

## 2020-02-26 DIAGNOSIS — M899 Disorder of bone, unspecified: Secondary | ICD-10-CM | POA: Insufficient documentation

## 2020-02-26 HISTORY — DX: Gout, unspecified: M10.9

## 2020-02-26 HISTORY — PX: GANGLION CYST EXCISION: SHX1691

## 2020-02-26 LAB — POCT I-STAT, CHEM 8
BUN: 42 mg/dL — ABNORMAL HIGH (ref 6–20)
Calcium, Ion: 1.29 mmol/L (ref 1.15–1.40)
Chloride: 106 mmol/L (ref 98–111)
Creatinine, Ser: 2.8 mg/dL — ABNORMAL HIGH (ref 0.61–1.24)
Glucose, Bld: 97 mg/dL (ref 70–99)
HCT: 29 % — ABNORMAL LOW (ref 39.0–52.0)
Hemoglobin: 9.9 g/dL — ABNORMAL LOW (ref 13.0–17.0)
Potassium: 4.4 mmol/L (ref 3.5–5.1)
Sodium: 143 mmol/L (ref 135–145)
TCO2: 27 mmol/L (ref 22–32)

## 2020-02-26 LAB — GLUCOSE, CAPILLARY: Glucose-Capillary: 97 mg/dL (ref 70–99)

## 2020-02-26 SURGERY — EXCISION, GANGLION CYST, FOOT
Anesthesia: General | Site: Foot | Laterality: Right

## 2020-02-26 MED ORDER — FENTANYL CITRATE (PF) 250 MCG/5ML IJ SOLN
INTRAMUSCULAR | Status: DC | PRN
  Administered 2020-02-26 (×2): 50 ug via INTRAVENOUS

## 2020-02-26 MED ORDER — CEFAZOLIN SODIUM-DEXTROSE 2-4 GM/100ML-% IV SOLN
INTRAVENOUS | Status: AC
  Filled 2020-02-26: qty 100

## 2020-02-26 MED ORDER — BUPIVACAINE HCL (PF) 0.5 % IJ SOLN
INTRAMUSCULAR | Status: DC | PRN
  Administered 2020-02-26: 10 mL

## 2020-02-26 MED ORDER — CEPHALEXIN 500 MG PO CAPS
500.0000 mg | ORAL_CAPSULE | Freq: Three times a day (TID) | ORAL | 0 refills | Status: DC
Start: 2020-02-26 — End: 2020-04-14

## 2020-02-26 MED ORDER — ONDANSETRON HCL 4 MG/2ML IJ SOLN: 4.0000 mg | Freq: Once | INTRAMUSCULAR | Status: DC | PRN

## 2020-02-26 MED ORDER — DEXAMETHASONE SODIUM PHOSPHATE 10 MG/ML IJ SOLN
INTRAMUSCULAR | Status: AC
  Filled 2020-02-26: qty 1

## 2020-02-26 MED ORDER — PROPOFOL 10 MG/ML IV BOLUS
INTRAVENOUS | Status: DC | PRN
  Administered 2020-02-26: 160 mg via INTRAVENOUS

## 2020-02-26 MED ORDER — ONDANSETRON HCL 4 MG/2ML IJ SOLN
INTRAMUSCULAR | Status: DC | PRN
  Administered 2020-02-26: 4 mg via INTRAVENOUS

## 2020-02-26 MED ORDER — MIDAZOLAM HCL 5 MG/5ML IJ SOLN
INTRAMUSCULAR | Status: DC | PRN
  Administered 2020-02-26: 2 mg via INTRAVENOUS

## 2020-02-26 MED ORDER — TRIAMCINOLONE ACETONIDE 40 MG/ML IJ SUSP
INTRAMUSCULAR | Status: DC | PRN
  Administered 2020-02-26: .5 mL

## 2020-02-26 MED ORDER — HYDROMORPHONE HCL 1 MG/ML IJ SOLN: 0.2500 mg | INTRAMUSCULAR | Status: DC | PRN

## 2020-02-26 MED ORDER — LACTATED RINGERS IV SOLN
INTRAVENOUS | Status: DC
  Administered 2020-02-26: 50 mL via INTRAVENOUS

## 2020-02-26 MED ORDER — LIDOCAINE 2% (20 MG/ML) 5 ML SYRINGE
INTRAMUSCULAR | Status: AC
  Filled 2020-02-26: qty 5

## 2020-02-26 MED ORDER — CEFAZOLIN SODIUM-DEXTROSE 2-4 GM/100ML-% IV SOLN
2.0000 g | INTRAVENOUS | Status: AC
  Administered 2020-02-26: 2 g via INTRAVENOUS

## 2020-02-26 MED ORDER — DEXAMETHASONE SODIUM PHOSPHATE 10 MG/ML IJ SOLN
INTRAMUSCULAR | Status: DC | PRN
Start: 2020-02-26 — End: 2020-02-26
  Administered 2020-02-26: 5 mg via INTRAVENOUS

## 2020-02-26 MED ORDER — MIDAZOLAM HCL 2 MG/2ML IJ SOLN
INTRAMUSCULAR | Status: AC
  Filled 2020-02-26: qty 2

## 2020-02-26 MED ORDER — FENTANYL CITRATE (PF) 100 MCG/2ML IJ SOLN
INTRAMUSCULAR | Status: AC
  Filled 2020-02-26: qty 2

## 2020-02-26 MED ORDER — OXYCODONE-ACETAMINOPHEN 10-325 MG PO TABS: 1.0000 | ORAL_TABLET | ORAL | 0 refills | Status: DC | PRN

## 2020-02-26 MED ORDER — LIDOCAINE 2% (20 MG/ML) 5 ML SYRINGE
INTRAMUSCULAR | Status: DC | PRN
  Administered 2020-02-26: 100 mg via INTRAVENOUS

## 2020-02-26 MED ORDER — MEPERIDINE HCL 25 MG/ML IJ SOLN: 6.2500 mg | INTRAMUSCULAR | Status: DC | PRN

## 2020-02-26 MED ORDER — PROPOFOL 10 MG/ML IV BOLUS
INTRAVENOUS | Status: AC
  Filled 2020-02-26: qty 40

## 2020-02-26 SURGICAL SUPPLY — 57 items
APL PRP STRL LF DISP 70% ISPRP (MISCELLANEOUS)
BLADE SURG 15 STRL LF DISP TIS (BLADE) ×2 IMPLANT
BLADE SURG 15 STRL SS (BLADE) ×4
BNDG CMPR 9X4 STRL LF SNTH (GAUZE/BANDAGES/DRESSINGS) ×2
BNDG ELASTIC 3X5.8 VLCR STR LF (GAUZE/BANDAGES/DRESSINGS) ×4 IMPLANT
BNDG ELASTIC 4X5.8 VLCR STR LF (GAUZE/BANDAGES/DRESSINGS) ×4 IMPLANT
BNDG ESMARK 4X9 LF (GAUZE/BANDAGES/DRESSINGS) ×3 IMPLANT
BNDG GAUZE ELAST 4 BULKY (GAUZE/BANDAGES/DRESSINGS) ×4 IMPLANT
CHLORAPREP W/TINT 26 (MISCELLANEOUS) IMPLANT
CNTNR URN SCR LID CUP LEK RST (MISCELLANEOUS) ×1 IMPLANT
CONT SPEC 4OZ STRL OR WHT (MISCELLANEOUS) ×4
COVER BACK TABLE 60X90IN (DRAPES) ×4 IMPLANT
COVER WAND RF STERILE (DRAPES) ×4 IMPLANT
CUFF TOURN SGL QUICK 18X4 (TOURNIQUET CUFF) IMPLANT
CUFF TOURN SGL QUICK 24 (TOURNIQUET CUFF)
CUFF TRNQT CYL 24X4X16.5-23 (TOURNIQUET CUFF) IMPLANT
DRAPE 3/4 80X56 (DRAPES) ×4 IMPLANT
DRAPE EXTREMITY T 121X128X90 (DISPOSABLE) ×4 IMPLANT
DRAPE SHEET LG 3/4 BI-LAMINATE (DRAPES) ×4 IMPLANT
DRAPE U-SHAPE 47X51 STRL (DRAPES) ×4 IMPLANT
ELECT REM PT RETURN 9FT ADLT (ELECTROSURGICAL) ×4
ELECTRODE REM PT RTRN 9FT ADLT (ELECTROSURGICAL) ×2 IMPLANT
GAUZE SPONGE 4X4 12PLY STRL (GAUZE/BANDAGES/DRESSINGS) ×4 IMPLANT
GAUZE XEROFORM 1X8 LF (GAUZE/BANDAGES/DRESSINGS) ×3 IMPLANT
GLOVE BIO SURGEON STRL SZ7.5 (GLOVE) ×4 IMPLANT
GLOVE BIOGEL PI IND STRL 8 (GLOVE) ×2 IMPLANT
GLOVE BIOGEL PI INDICATOR 8 (GLOVE) ×2
GOWN STRL REUS W/ TWL XL LVL3 (GOWN DISPOSABLE) ×2 IMPLANT
GOWN STRL REUS W/TWL XL LVL3 (GOWN DISPOSABLE) ×4
MANIFOLD NEPTUNE II (INSTRUMENTS) ×4 IMPLANT
NDL HYPO 25X1 1.5 SAFETY (NEEDLE) ×1 IMPLANT
NEEDLE HYPO 22GX1.5 SAFETY (NEEDLE) ×3 IMPLANT
NEEDLE HYPO 25X1 1.5 SAFETY (NEEDLE) ×4 IMPLANT
NS IRRIG 1000ML POUR BTL (IV SOLUTION) IMPLANT
PADDING CAST ABS 4INX4YD NS (CAST SUPPLIES) ×2
PADDING CAST ABS COTTON 4X4 ST (CAST SUPPLIES) ×2 IMPLANT
PENCIL SMOKE EVAC W/HOLSTER (ELECTROSURGICAL) ×4 IMPLANT
RASP SM TEAR CROSS CUT (RASP) ×3 IMPLANT
SET BASIN DAY SURGERY F.S. (CUSTOM PROCEDURE TRAY) ×4 IMPLANT
SET IRRIG Y TYPE TUR BLADDER L (SET/KITS/TRAYS/PACK) IMPLANT
SPONGE LAP 4X18 RFD (DISPOSABLE) ×4 IMPLANT
STAPLER VISISTAT 35W (STAPLE) IMPLANT
STOCKINETTE 6  STRL (DRAPES) ×4
STOCKINETTE 6 STRL (DRAPES) ×2 IMPLANT
SUCTION FRAZIER HANDLE 10FR (MISCELLANEOUS)
SUCTION TUBE FRAZIER 10FR DISP (MISCELLANEOUS) IMPLANT
SUT ETHILON 4 0 PS 2 18 (SUTURE) IMPLANT
SUT MNCRL AB 3-0 PS2 18 (SUTURE) ×3 IMPLANT
SUT MNCRL AB 4-0 PS2 18 (SUTURE) ×3 IMPLANT
SUT VIC AB 2-0 SH 27 (SUTURE) ×4
SUT VIC AB 2-0 SH 27XBRD (SUTURE) ×1 IMPLANT
SYR BULB EAR ULCER 3OZ GRN STR (SYRINGE) ×4 IMPLANT
SYR CONTROL 10ML LL (SYRINGE) ×7 IMPLANT
TRAY DSU PREP LF (CUSTOM PROCEDURE TRAY) ×4 IMPLANT
TUBE IRRIGATION SET MISONIX (TUBING) IMPLANT
UNDERPAD 30X36 HEAVY ABSORB (UNDERPADS AND DIAPERS) ×4 IMPLANT
YANKAUER SUCT BULB TIP NO VENT (SUCTIONS) ×4 IMPLANT

## 2020-02-26 NOTE — Progress Notes (Signed)
Orthopedic Tech Progress Note Patient Details:  Bruce Hunter 1976/04/09 [id number]  Ortho Devices Type of Ortho Device: CAM walker Ortho Device/Splint Location: Right Ortho Device/Splint Interventions: Application   Post Interventions Patient Tolerated: Well Instructions Provided: Care of device   [name] 02/26/2020, 9:25 AM

## 2020-02-26 NOTE — Transfer of Care (Signed)
Immediate Anesthesia Transfer of Care Note  Patient: Bruce Hunter  Procedure(s) Performed: EXCISION TOPHUS RIGHT FOOT (Right Foot)  Patient Location: PACU  Anesthesia Type:GA combined with regional for post-op pain  Level of Consciousness: awake and alert   Airway & Oxygen Therapy: Patient Spontanous Breathing and Patient connected to nasal cannula oxygen  Post-op Assessment: Report given to RN, Post -op Vital signs reviewed and stable and Patient moving all extremities  Post vital signs: Reviewed and stable  Last Vitals:  Vitals Value Taken Time  BP 145/97 02/26/20 0853  Temp    Pulse 79 02/26/20 0854  Resp 20 02/26/20 0854  SpO2 95 % 02/26/20 0854  Vitals shown include unvalidated device data.  Last Pain:  Vitals:   02/26/20 0614  TempSrc: Oral  PainSc: 10-Worst pain ever      Patients Stated Pain Goal: 1 (39/43/20 0379)  Complications: No complications documented.

## 2020-02-26 NOTE — Discharge Instructions (Signed)
Post-Surgery Instructions  1. If you are recuperating from surgery anywhere other than home, please be sure to leave [place] a number where you can be reached. 2. Go directly home and rest. 3. The keep operated foot (or feet) elevated six inches above the hip when sitting or lying down. 4. Support the elevated foot and leg with pillows under the calf. DO NOT PLACE PILLOWS UNDER THE KNEE. 5. DO NOT REMOVE or get your bandages wet. This will increase your chances of getting an infection. 6. Wear your surgical shoe at all times when you are up. 7. A limited amount of pain and swelling may occur. The skin may take on a bruised appearance. This is no cause for alarm. 8. For slight pain and swelling, apply an ice pack directly over the bandage for 15 minutes every hour. Continue icing until seen in the office. DO NOT apply any form of heat to the area. 9. Have prescription(s) filled immediately and take as directed. 10. Drink lots of liquids, water, and juice. 11. CALL THE OFFICE IMMEDIATELY IF: a. Bleeding continues b. Pain increases and/or does not respond to medication c. Bandage or cast appears too tight d. Any liquids (water, coffee, etc.) have spilled on your bandages. e. Tripping, falling, or stubbing the surgical foot f. If your temperature rises above 101 g. If you have ANY questions at all 12. You are expected to be: weightbearing  If you need to reach the nurse for any reason, please call: [place]: [phone] [place]: [phone] [place]: [phone]  Post Anesthesia Home Care Instructions  Activity: Get plenty of rest for the remainder of the day. A responsible individual must stay with you for 24 hours following the procedure.  For the next 24 hours, DO NOT: -Drive a car -Paediatric nurse -Drink alcoholic beverages -Take any medication unless instructed by your physician -Make any legal decisions or sign important papers.  Meals: Start with  liquid foods such as gelatin or soup. Progress to regular foods as tolerated. Avoid greasy, spicy, heavy foods. If nausea and/or vomiting occur, drink only clear liquids until the nausea and/or vomiting subsides. Call your physician if vomiting continues.  Special Instructions/Symptoms: Your throat may feel dry or sore from the anesthesia or the breathing tube placed in your throat during surgery. If this causes discomfort, gargle with warm salt water. The discomfort should disappear within 24 hours.  If you had a scopolamine patch placed behind your ear for the management of post- operative nausea and/or vomiting:  1. The medication in the patch is effective for 72 hours, after which it should be removed.  Wrap patch in a tissue and discard in the trash. Wash hands thoroughly with soap and water. 2. You may remove the patch earlier than 72 hours if you experience unpleasant side effects which may include dry mouth, dizziness or visual disturbances. 3. Avoid touching the patch. Wash your hands with soap and water after contact with the patch.

## 2020-02-26 NOTE — Anesthesia Preprocedure Evaluation (Signed)
Anesthesia Evaluation  Patient identified by MRN, date of birth, ID band Patient awake    Reviewed: Allergy & Precautions, NPO status , Patient's Chart, lab work & pertinent test results  Airway Mallampati: II  TM Distance: >3 FB Neck ROM: Full    Dental   Pulmonary Current Smoker,    Pulmonary exam normal        Cardiovascular hypertension, Pt. on medications Normal cardiovascular exam     Neuro/Psych    GI/Hepatic   Endo/Other  diabetes, Type 2  Renal/GU      Musculoskeletal   Abdominal   Peds  Hematology   Anesthesia Other Findings   Reproductive/Obstetrics                             Anesthesia Physical Anesthesia Plan  ASA: III  Anesthesia Plan: General   Post-op Pain Management:    Induction: Intravenous  PONV Risk Score and Plan: Ondansetron  Airway Management Planned: LMA  Additional Equipment:   Intra-op Plan:   Post-operative Plan: Extubation in OR  Informed Consent: I have reviewed the patients History and Physical, chart, labs and discussed the procedure including the risks, benefits and alternatives for the proposed anesthesia with the patient or authorized representative who has indicated his/her understanding and acceptance.       Plan Discussed with: CRNA and Surgeon  Anesthesia Plan Comments:         Anesthesia Quick Evaluation

## 2020-02-26 NOTE — Anesthesia Procedure Notes (Signed)
Procedure Name: LMA Insertion Date/Time: 02/26/2020 7:47 AM Performed by: Myna Bright, CRNA Pre-anesthesia Checklist: Patient identified, Emergency Drugs available, Suction available and Patient being monitored Patient Re-evaluated:Patient Re-evaluated prior to induction Oxygen Delivery Method: Circle system utilized Preoxygenation: Pre-oxygenation with 100% oxygen Induction Type: IV induction Ventilation: Mask ventilation without difficulty LMA: LMA inserted LMA Size: 5.0 Tube type: Oral Number of attempts: 1 Placement Confirmation: positive ETCO2 and breath sounds checked- equal and bilateral Tube secured with: Tape Dental Injury: Teeth and Oropharynx as per pre-operative assessment

## 2020-02-26 NOTE — Anesthesia Postprocedure Evaluation (Signed)
Anesthesia Post Note  Patient: FRIEND DORFMAN  Procedure(s) Performed: EXCISION TOPHUS RIGHT FOOT (Right Foot)     Patient location during evaluation: PACU Anesthesia Type: General Level of consciousness: awake and alert Pain management: pain level controlled Vital Signs Assessment: post-procedure vital signs reviewed and stable Respiratory status: spontaneous breathing, nonlabored ventilation, respiratory function stable and patient connected to nasal cannula oxygen Cardiovascular status: blood pressure returned to baseline and stable Postop Assessment: no apparent nausea or vomiting Anesthetic complications: no   No complications documented.  Last Vitals:  Vitals:   02/26/20 0930 02/26/20 0945  BP: 111/74 130/80  Pulse: 74 68  Resp: 15 13  Temp:    SpO2: 96% 95%    Last Pain:  Vitals:   02/26/20 0945  TempSrc:   PainSc: 0-No pain        RLE Motor Response: Purposeful movement (02/26/20 0945) RLE Sensation: Full sensation (02/26/20 0945)      Mckenzye Cutright DAVID

## 2020-02-26 NOTE — Brief Op Note (Signed)
02/26/2020  8:50 AM  PATIENT:  [name]  44 y.o. male  PRE-OPERATIVE DIAGNOSIS:  GANGLION CYST  POST-OPERATIVE DIAGNOSIS:  GANGLION CYST  PROCEDURE:  Procedure(s): EXCISION TOPHUS RIGHT FOOT (Right)  SURGEON:  Surgeon(s) and Role:    * Evelina Bucy, DPM - Primary  PHYSICIAN ASSISTANT:   ASSISTANTS:    ANESTHESIA:   none  EBL:  5 ml   BLOOD ADMINISTERED:none  DRAINS: none   LOCAL MEDICATIONS USED:  MARCAINE    and Amount: 10 ml  SPECIMEN:   ID Type Source Tests Collected by Time Destination  1 : left foot cyst Tissue PATH Soft tissue resection SURGICAL PATHOLOGY Evelina Bucy, DPM 02/26/2020 0716       DISPOSITION OF SPECIMEN:  PATHOLOGY  COUNTS:  YES  TOURNIQUET:   Total Tourniquet Time Documented: area (Right) - 50 minutes Total: area (Right) - 50 minutes   DICTATION: .Viviann Spare Dictation  PLAN OF CARE: Discharge to home after PACU  PATIENT DISPOSITION:  PACU - hemodynamically stable.   Delay start of Pharmacological VTE agent (>24hrs) due to surgical blood loss or risk of bleeding: not applicable

## 2020-02-26 NOTE — H&P (Signed)
  Subjective:  Patient ID: Bruce Hunter, male    DOB: Jan 08, 1976,  MRN: [id number]   Objective:   Vitals:   02/26/20 0614  BP: (!) 127/97  Pulse: 81  Resp: 18  Temp: 98.7 F (37.1 C)  SpO2: 99%   Physical Exam: warm, good capillary refill, nail exam onychomycosis of the toenails, no trophic changes or ulcerative lesions, normal DP and PT pulses, and diminished sensation Left Foot: pain at lateral 4th/5th TMT  Right Foot: pain at dorsal ankle with fluctuant mass, pain over 2nd TMT    No images are attached to the encounter. Assessment & Plan:  Patient was evaluated and treated and all questions answered.   Arthritis, Ganglion Cyst -OR today for right foot excision of ganglion cyst, gout tophus, possible exostectomy of bone. Consent reviewed. RLE marked. -Proceed to OR today.  Evelina Bucy, DPM  Accessible via secure chat for questions or concerns.

## 2020-02-27 ENCOUNTER — Encounter (HOSPITAL_BASED_OUTPATIENT_CLINIC_OR_DEPARTMENT_OTHER): Payer: Self-pay | Admitting: Podiatry

## 2020-02-27 LAB — SURGICAL PATHOLOGY

## 2020-03-06 ENCOUNTER — Ambulatory Visit (INDEPENDENT_AMBULATORY_CARE_PROVIDER_SITE_OTHER): Payer: Medicaid Other | Admitting: Podiatry

## 2020-03-06 ENCOUNTER — Telehealth: Payer: Self-pay | Admitting: Podiatry

## 2020-03-06 ENCOUNTER — Other Ambulatory Visit: Payer: Self-pay

## 2020-03-06 ENCOUNTER — Telehealth: Payer: Self-pay | Admitting: *Deleted

## 2020-03-06 DIAGNOSIS — M19079 Primary osteoarthritis, unspecified ankle and foot: Secondary | ICD-10-CM

## 2020-03-06 DIAGNOSIS — M6740 Ganglion, unspecified site: Secondary | ICD-10-CM

## 2020-03-06 DIAGNOSIS — M1A9XX1 Chronic gout, unspecified, with tophus (tophi): Secondary | ICD-10-CM

## 2020-03-06 DIAGNOSIS — M898X9 Other specified disorders of bone, unspecified site: Secondary | ICD-10-CM

## 2020-03-06 DIAGNOSIS — B351 Tinea unguium: Secondary | ICD-10-CM

## 2020-03-06 NOTE — Progress Notes (Signed)
  Subjective:  Patient ID: GEOVANI TOOTLE, male    DOB: [birth year],  MRN: [id number]  Chief Complaint  Patient presents with  . Routine Post Op    POV#1 DOS 6.23.2021 EXC GANGLION RT. Pt states feeling much better. No concerns.  . Nail Problem    Nail trim 1-5 bilateral   DOS: 02/26/20 Procedure: Excision tophaceous cyst  44 y.o. male presents with the above complaint. History confirmed with patient.   Objective:  Physical Exam: no tenderness at the surgical site, local edema noted and calf supple, nontender. Incision: healing well, no significant drainage, no dehiscence Nails elongated dystrophic. Decreased protective sensation  No images are attached to the encounter.  Assessment:   1. Onychomycosis of multiple toenails with type 2 diabetes mellitus and peripheral neuropathy ([place])   2. Gouty tophi    Plan:  Patient was evaluated and treated and all questions answered.  Post-operative State -Dressing replaced with DSD. -Ok to remove dressing and shower in 1 week -F/u in 2 weeks.  Gout -Will refer for Krystexxa for gouty tophus  Onychomycosis -Nails debrided x10  No follow-ups on file.

## 2020-03-06 NOTE — Telephone Encounter (Signed)
Dr. March Rummage referred pt to [facility] - Rheumatology as possible candidate for Krystexxa. Faxed referral, clinical and demographics to [facility] - Rheumatology.

## 2020-03-06 NOTE — Telephone Encounter (Signed)
[facility] called stating they received a referral from our office for the patient but are unable to accept the referral.  [facility] does not accept Medicaid.

## 2020-03-06 NOTE — Telephone Encounter (Signed)
Is there another rheumatologist we can refer to?

## 2020-03-06 NOTE — Telephone Encounter (Signed)
-----   Message from Evelina Bucy, DPM sent at 03/06/2020  8:36 AM EDT ----- Can we refer for Rheumatology referral for [facility]

## 2020-03-09 NOTE — Progress Notes (Signed)
  Subjective:  Patient ID: Bruce Hunter, male    DOB: 05/26/1976,  MRN: [id number]  Chief Complaint  Patient presents with  . Follow-up    Bilateral ganglion cyst - Discuss MRI results. MRI = 01/08/20. Pt stated, "No improvement. 11/10 pain - [radiates] up my leg now. Worse when it rains or if I drink alcohol".    44 y.o. male presents with the above complaint. History confirmed with patient.   Objective:  Physical Exam: warm, good capillary refill, nail exam onychomycosis of the toenails, no trophic changes or ulcerative lesions, normal DP and PT pulses, and diminished sensation Left Foot: pain at lateral 4th/5th TMT  Right Foot: pain at dorsal ankle with fluctuant mass, pain over 2nd TMT   No images are attached to the encounter.  Assessment:   1. Ganglion cyst   2. Gouty tophi   3. Arthritis of midfoot   4. Bony exostosis   5. DM type 2 with diabetic peripheral neuropathy ([place])      Plan:  Patient was evaluated and treated and all questions answered.  Arthritis, Ganglion Cyst -MRI reviewed with patient.  Complex process involving dorsum of the foot most likely combination of dissecting multiseptated ganglion cyst and tophaceous gout. -Discussed with patient that he may benefit from excision of the cyst and tophaceous gout.  Patient would like to proceed.  Consent forms reviewed and drafted -Patient has failed all conservative therapy and wishes to proceed with surgical intervention. All risks, benefits, and alternatives discussed with patient. No guarantees given. Consent reviewed and signed by patient. -Planned procedures: Excision of ganglion cyst and gout tophi right foot -Risk factors: Diabetes  No follow-ups on file.

## 2020-03-10 NOTE — Telephone Encounter (Signed)
Faxed referral, clinicals and demographics to [facility] - Rheumatology.

## 2020-03-10 NOTE — Addendum Note (Signed)
Addended by: Harriett Sine D on: 03/10/2020 08:24 AM   Modules accepted: Orders

## 2020-03-13 ENCOUNTER — Other Ambulatory Visit: Payer: Self-pay

## 2020-03-13 ENCOUNTER — Ambulatory Visit (INDEPENDENT_AMBULATORY_CARE_PROVIDER_SITE_OTHER): Payer: Medicaid Other | Admitting: Podiatry

## 2020-03-13 DIAGNOSIS — Z9889 Other specified postprocedural states: Secondary | ICD-10-CM

## 2020-03-13 DIAGNOSIS — M1A9XX1 Chronic gout, unspecified, with tophus (tophi): Secondary | ICD-10-CM

## 2020-03-13 MED ORDER — HYDROCODONE-ACETAMINOPHEN 5-325 MG PO TABS: 1.0000 | ORAL_TABLET | Freq: Four times a day (QID) | ORAL | 0 refills | Status: DC | PRN

## 2020-03-13 NOTE — Progress Notes (Signed)
°  Subjective:  Patient ID: Bruce Hunter, male    DOB: [birth year],  MRN: [id number]  Chief Complaint  Patient presents with   Routine Post Op    POV#2 DOS 6.23.2021 EXC GANGLION RT. Pt states having lots of pain.    DOS: 02/26/20 Procedure: Excision tophaceous cyst  44 y.o. male presents with the above complaint. History confirmed with patient.   Objective:  Physical Exam: no tenderness at the surgical site, local edema noted and calf supple, nontender. Incision: healing well, no significant drainage, no dehiscence Nails elongated dystrophic. Decreased protective sensation  No images are attached to the encounter.  Assessment:   1. Gouty tophi   2. Post-operative state    Plan:  Patient was evaluated and treated and all questions answered.  Post-operative State -Ok to shower and get the foot wet. -Surgical shoe dispensed. -Refill pain rx.  Gout -Pending rheumatology referral -Consider Krystexxa for gouty tophus -Advised he discuss all over body pain with his PCP or with rheumatology  Return in about 2 weeks (around 03/27/2020).

## 2020-03-13 NOTE — Op Note (Addendum)
Patient Name: Bruce Hunter DOB: [birth year]  MRN: [id number]   Date of Service: 02/26/2020  Surgeon: Dr. Hardie Pulley, DPM Assistants: None Pre-operative Diagnosis:  Tophus, midfoot exostosis Post-operative Diagnosis:  same Procedures:  1) Excision of tophus left midfoot  2) Midfoot exostectomy Pathology/Specimens: ID Type Source Tests Collected by Time Destination  1 : left foot cyst Tissue PATH Soft tissue resection SURGICAL PATHOLOGY Evelina Bucy, DPM 02/26/2020 0716    Anesthesia: MAC/local Hemostasis:  Total Tourniquet Time Documented: area (Right) - 50 minutes Total: area (Right) - 50 minutes  Estimated Blood Loss: 50 mL Materials: * No implants in log * Medications: none Complications: none  Indications for Procedure:  This is a 43 y.o. male with chronic pain to the midfoot with MRI evidence of a large tophus of the area.  We discussed he would benefit from surgical excision of the tophus.  Consent was reviewed and the patient.  No guarantees are given.   Procedure in Detail: Patient was identified in pre-operative holding area. Formal consent was signed and the left lower extremity was marked. Patient was brought back to the operating room. Anesthesia was induced. The extremity was prepped and draped in the usual sterile fashion. Timeout was taken to confirm patient name, laterality, and procedure prior to incision.   Attention was then directed to the left midfoot.  An incision was made over the enlarged painful area.  Dissection was carried down through skin subcutaneous tissue with care to avoid all vital neurovascular structures.  All bleeders were cauterized with electrocautery.  Dissection was carried down to level of the muscle.  The muscle appeared with white powdery sheen consistent with intramuscular gout tophi.  The muscle was gently debrided.  A deep incision was made over the capsule overlying the tophus.  Powdery white tophaceous material was expressed.   This was maximally expressed.  It was collected for pathology.  After maximal excision of all the tophaceous material, the area was copiously irrigated.  The underlying midfoot exostosis was then rasped smooth. The wound was irrigated. Layered closure was then performed with 3-0, 4-0, 5-0 Monocryl and Dermabond.  The foot was then dressed with xeroform, 4x4, kerlix, and ACE bandage. Patient tolerated the procedure well.   Disposition: Following a period of post-operative monitoring, patient will be transferred home.

## 2020-03-27 ENCOUNTER — Ambulatory Visit (INDEPENDENT_AMBULATORY_CARE_PROVIDER_SITE_OTHER): Payer: Medicaid Other | Admitting: Podiatry

## 2020-03-27 DIAGNOSIS — Z5329 Procedure and treatment not carried out because of patient's decision for other reasons: Secondary | ICD-10-CM

## 2020-03-27 NOTE — Progress Notes (Signed)
No show for appt. 

## 2020-04-01 ENCOUNTER — Encounter: Payer: Medicaid Other | Admitting: Podiatry

## 2020-04-14 ENCOUNTER — Encounter: Payer: Self-pay | Admitting: Podiatry

## 2020-04-14 ENCOUNTER — Other Ambulatory Visit: Payer: Self-pay

## 2020-04-14 ENCOUNTER — Ambulatory Visit (INDEPENDENT_AMBULATORY_CARE_PROVIDER_SITE_OTHER): Payer: Medicaid Other | Admitting: Podiatry

## 2020-04-14 VITALS — Temp 97.6°F

## 2020-04-14 DIAGNOSIS — M1A9XX1 Chronic gout, unspecified, with tophus (tophi): Secondary | ICD-10-CM

## 2020-04-14 NOTE — Progress Notes (Signed)
  Subjective:  Patient ID: Bruce Hunter, male    DOB: 05/07/1976,  MRN: [id number]  Chief Complaint  Patient presents with  . Routine Post Op     DOS 02/26/2020 EXC GANGLION RT//Bruce Hunter pt; pt stated, "The top of my foot still hurts; has not seen a Rheumatologist yet; has appt with PCP today but said was not going to go today because it was too hot"   DOS: 02/26/20 Procedure: Excision tophaceous cyst  44 y.o. male presents with the above complaint. History confirmed with patient.   Objective:  Physical Exam: no tenderness at the surgical site, local edema noted and calf supple, nontender. Incision: healed Decreased protective sensation  No images are attached to the encounter.  Assessment:   1. Gouty tophi    Plan:  Patient was evaluated and treated and all questions answered.  Post-operative State -Well healed from surgery, does have some edema that appears to be gouty recurrence. Patient to follow up with rheumatology  Gout -Pending rheumatology referral, consider Krystexxa  No follow-ups on file.

## 2020-05-26 ENCOUNTER — Ambulatory Visit (INDEPENDENT_AMBULATORY_CARE_PROVIDER_SITE_OTHER): Payer: Medicaid Other | Admitting: Podiatry

## 2020-05-26 DIAGNOSIS — Z5329 Procedure and treatment not carried out because of patient's decision for other reasons: Secondary | ICD-10-CM

## 2020-05-26 NOTE — Progress Notes (Signed)
No show for appt. 

## 2020-05-29 ENCOUNTER — Other Ambulatory Visit: Payer: Self-pay

## 2020-05-29 ENCOUNTER — Ambulatory Visit (INDEPENDENT_AMBULATORY_CARE_PROVIDER_SITE_OTHER): Payer: Medicaid Other | Admitting: Podiatry

## 2020-05-29 DIAGNOSIS — B351 Tinea unguium: Secondary | ICD-10-CM

## 2020-05-29 DIAGNOSIS — M1A9XX1 Chronic gout, unspecified, with tophus (tophi): Secondary | ICD-10-CM

## 2020-05-29 DIAGNOSIS — E1142 Type 2 diabetes mellitus with diabetic polyneuropathy: Secondary | ICD-10-CM | POA: Diagnosis not present

## 2020-06-03 NOTE — Progress Notes (Signed)
  Subjective:  Patient ID: Bruce Hunter, male    DOB: 06-09-1976,  MRN: [id number]  No chief complaint on file.  DOS: 02/26/20 Procedure: Excision tophaceous cyst  44 y.o. male presents for follow-up still having generalized foot and ankle pain bilaterally and states that he needs medication for the gout  Objective:  Physical Exam: no tenderness at the surgical site, local edema noted and calf supple, nontender. Incision: healed Decreased protective sensation  No images are attached to the encounter.  Assessment:   1. Gouty tophi   2. Onychomycosis of multiple toenails with type 2 diabetes mellitus and peripheral neuropathy ([place])    Plan:  Patient was evaluated and treated and all questions answered.  Gout -Well healed from surgery, continues to have chronic pain that he would benefit from rheumatology evaluation for chronic gout.  He has not followed up as advised.  I discussed with him that surgical intervention would not be beneficial if he does not get the gout under control  Diabetes with DPN, Onychomycosis -Educated on diabetic footcare. Diabetic risk level 1 -Nails x10 debrided sharply and manually with large nail nipper and rotary burr.    Procedure: Nail Debridement Rationale: Patient meets criteria for routine foot care due to DPN Type of Debridement: manual, sharp debridement. Instrumentation: Nail nipper, rotary burr. Number of Nails: 10    Return if symptoms worsen or fail to improve.

## 2020-07-27 ENCOUNTER — Other Ambulatory Visit: Payer: Self-pay | Admitting: Sports Medicine

## 2020-08-14 ENCOUNTER — Ambulatory Visit (INDEPENDENT_AMBULATORY_CARE_PROVIDER_SITE_OTHER): Payer: Medicaid Other | Admitting: Podiatry

## 2020-08-14 ENCOUNTER — Other Ambulatory Visit: Payer: Self-pay

## 2020-08-14 DIAGNOSIS — R234 Changes in skin texture: Secondary | ICD-10-CM | POA: Diagnosis not present

## 2020-08-14 DIAGNOSIS — M1A9XX1 Chronic gout, unspecified, with tophus (tophi): Secondary | ICD-10-CM

## 2020-08-14 DIAGNOSIS — B351 Tinea unguium: Secondary | ICD-10-CM

## 2020-08-14 DIAGNOSIS — E1169 Type 2 diabetes mellitus with other specified complication: Secondary | ICD-10-CM

## 2020-08-14 DIAGNOSIS — E1142 Type 2 diabetes mellitus with diabetic polyneuropathy: Secondary | ICD-10-CM | POA: Diagnosis not present

## 2020-08-14 NOTE — Progress Notes (Signed)
  Subjective:  Patient ID: Bruce Hunter, male    DOB: Apr 22, 1976,  MRN: [id number]  Chief Complaint  Patient presents with  . Nail Problem    Nail trim 1-5 bilateral  . Callouses    Plantar heel cracked skin   DOS: 02/26/20 Procedure: Excision tophaceous cyst  44 y.o. male presents for follow-up still having generalized foot and ankle pain bilaterally and states that he needs medication for the gout  Objective:  Physical Exam: no tenderness at the surgical site, local edema noted and calf supple, nontender. Incision: healed Decreased protective sensation  No images are attached to the encounter.  Assessment:   1. Gouty tophi   2. 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 Fissured skin    Plan:  Patient was evaluated and treated and all questions answered.  Gout -Awaiting referral to rheumatology -Continue gout diet.  Skin fissure -Gently debrided, applied moisturizing cream. Advised to do so daily. Recommend Foot [facility].  Onychomycosis, Diabetes and DPN -Patient is diabetic with a qualifying condition for at risk foot care.  Procedure: Nail Debridement Type of Debridement: manual, sharp debridement. Instrumentation: Nail nipper, rotary burr. Number of Nails: 10  Return if symptoms worsen or fail to improve.

## 2020-09-14 ENCOUNTER — Other Ambulatory Visit: Payer: Self-pay | Admitting: Podiatry

## 2020-09-14 NOTE — Telephone Encounter (Signed)
Please advise 

## 2020-11-17 ENCOUNTER — Other Ambulatory Visit: Payer: Self-pay | Admitting: Podiatry

## 2020-11-17 ENCOUNTER — Ambulatory Visit (INDEPENDENT_AMBULATORY_CARE_PROVIDER_SITE_OTHER): Payer: Medicaid Other | Admitting: Podiatry

## 2020-11-17 DIAGNOSIS — Z5329 Procedure and treatment not carried out because of patient's decision for other reasons: Secondary | ICD-10-CM

## 2020-11-17 NOTE — Progress Notes (Signed)
   Complete physical exam  Patient: Bruce Hunter   DOB: 06/25/1999   45 y.o. Male  MRN: [id number]  Subjective:    No chief complaint on file.   Bruce Hunter is a 45 y.o. male who presents today for a complete physical exam. She reports consuming a {diet types:17450} diet. {types:19826} She generally feels {DESC; WELL/FAIRLY WELL/POORLY:18703}. She reports sleeping {DESC; WELL/FAIRLY WELL/POORLY:18703}. She {does/does not:200015} have additional problems to discuss today.    Most recent fall risk assessment:    03/02/2022   10:42 AM  Fall Risk   Falls in the past year? 0  Number falls in past yr: 0  Injury with Fall? 0  Risk for fall due to : No Fall Risks  Follow up Falls evaluation completed     Most recent depression screenings:    03/02/2022   10:42 AM 01/21/2021   10:46 AM  PHQ 2/9 Scores  PHQ - 2 Score 0 0  PHQ- 9 Score 5     {VISON DENTAL STD PSA (Optional):27386}  {History (Optional):23778}  Patient Care Team: Jessup, Joy, NP as PCP - General (Nurse Practitioner)   Outpatient Medications Prior to Visit  Medication Sig   fluticasone (FLONASE) 50 MCG/ACT nasal spray Place 2 sprays into both nostrils in the morning and at bedtime. After 7 days, reduce to once daily.   norgestimate-ethinyl estradiol (SPRINTEC 28) 0.25-35 MG-MCG tablet Take 1 tablet by mouth daily.   Nystatin POWD Apply liberally to affected area 2 times per day   spironolactone (ALDACTONE) 100 MG tablet Take 1 tablet (100 mg total) by mouth daily.   No facility-administered medications prior to visit.    ROS        Objective:     There were no vitals taken for this visit. {Vitals History (Optional):23777}  Physical Exam   No results found for any visits on 04/07/22. {Show previous labs (optional):23779}    Assessment & Plan:    Routine Health Maintenance and Physical Exam  Immunization History  Administered Date(s) Administered   DTaP 09/08/1999, 11/04/1999,  01/13/2000, 09/28/2000, 04/13/2004   Hepatitis A 02/08/2008, 02/13/2009   Hepatitis B 06/26/1999, 08/03/1999, 01/13/2000   HiB (PRP-OMP) 09/08/1999, 11/04/1999, 01/13/2000, 09/28/2000   IPV 09/08/1999, 11/04/1999, 07/03/2000, 04/13/2004   Influenza,inj,Quad PF,6+ Mos 05/16/2014   Influenza-Unspecified 08/15/2012   MMR 07/03/2001, 04/13/2004   Meningococcal Polysaccharide 02/13/2012   Pneumococcal Conjugate-13 09/28/2000   Pneumococcal-Unspecified 01/13/2000, 03/28/2000   Tdap 02/13/2012   Varicella 07/03/2000, 02/08/2008    Health Maintenance  Topic Date Due   HIV Screening  Never done   Hepatitis C Screening  Never done   INFLUENZA VACCINE  04/05/2022   PAP-Cervical Cytology Screening  04/07/2022 (Originally 06/24/2020)   PAP SMEAR-Modifier  04/07/2022 (Originally 06/24/2020)   TETANUS/TDAP  04/07/2022 (Originally 02/12/2022)   HPV VACCINES  Discontinued   COVID-19 Vaccine  Discontinued    Discussed health benefits of physical activity, and encouraged her to engage in regular exercise appropriate for her age and condition.  Problem List Items Addressed This Visit   None Visit Diagnoses     Annual physical exam    -  Primary   Cervical cancer screening       Need for Tdap vaccination          No follow-ups on file.     Joy Jessup, NP   

## 2020-11-18 ENCOUNTER — Ambulatory Visit: Payer: Medicaid Other | Admitting: Podiatry

## 2020-11-18 ENCOUNTER — Other Ambulatory Visit: Payer: Self-pay

## 2020-11-18 ENCOUNTER — Encounter: Payer: Self-pay | Admitting: Podiatry

## 2020-11-18 DIAGNOSIS — E1142 Type 2 diabetes mellitus with diabetic polyneuropathy: Secondary | ICD-10-CM

## 2020-11-18 DIAGNOSIS — M79675 Pain in left toe(s): Secondary | ICD-10-CM | POA: Diagnosis not present

## 2020-11-18 DIAGNOSIS — M79674 Pain in right toe(s): Secondary | ICD-10-CM

## 2020-11-18 DIAGNOSIS — B351 Tinea unguium: Secondary | ICD-10-CM | POA: Diagnosis not present

## 2020-11-26 NOTE — Progress Notes (Signed)
  Subjective:  Patient ID: TORONTO TRABERT, male    DOB: 08-Dec-1975,  MRN: [id number]  45 y.o. male presents with preventative diabetic foot care and painful thick toenails that are difficult to trim. Pain interferes with ambulation. Aggravating factors include wearing enclosed shoe gear. Pain is relieved with periodic professional debridement..    Patient did not check blood glucose this morning. He states blood glucose was checked at PCP's office two weeks ago. Patient states he never received a call from Rheumatologist's office regarding referral. He states he continues to have pain in his feet due to gout.   PCP: Elwyn Reach, MD and last visit was: two weeks ago.  Review of Systems: Negative except as noted in the HPI.   No Known Allergies  Objective:  There were no vitals filed for this visit. Constitutional Patient is a pleasant 45 y.o. African American male in NAD. AAO x 3.  Vascular Capillary refill time to digits immediate b/l. Palpable pedal pulses b/l LE. Pedal hair sparse. Lower extremity skin temperature gradient within normal limits. No cyanosis or clubbing noted.  Neurologic Normal speech. Protective sensation diminished with 10g monofilament b/l.  Dermatologic Pedal skin with normal turgor, texture and tone bilaterally. No open wounds bilaterally. No interdigital macerations bilaterally. Toenails 1-5 b/l elongated, discolored, dystrophic, thickened, crumbly with subungual debris and tenderness to dorsal palpation.  Orthopedic: Normal muscle strength 5/5 to all lower extremity muscle groups bilaterally. No pain crepitus or joint limitation noted with ROM b/l. No gross bony deformities bilaterally. Patient ambulates independent of any assistive aids.    Assessment:   1. Pain due to onychomycosis of toenails of both feet   2. DM type 2 with diabetic peripheral neuropathy ([place])    Plan:  Patient was evaluated and treated and all questions answered.  Onychomycosis with  pain -Nails palliatively debridement as below. -Educated on self-care  Procedure: Nail Debridement Rationale: Pain Type of Debridement: manual, sharp debridement. Instrumentation: Nail nipper, rotary burr. Number of Nails: 10  -Examined patient. -Continue diabetic foot care principles. -Patient to continue soft, supportive shoe gear daily. -Toenails 1-5 b/l were debrided in length and girth with sterile nail nippers and dremel without iatrogenic bleeding.  -Patient to report any pedal injuries to medical professional immediately. -Researched patient's Rheumatology referral and referral was closed due to being unable to contact patient. Advised patient to call PCPs office and request referral be reopened for him. He related understanding. -Patient/POA to call should there be question/concern in the interim.  Return in about 3 months (around 02/18/2021).  Marzetta Board, DPM

## 2020-12-23 ENCOUNTER — Telehealth: Payer: Self-pay | Admitting: *Deleted

## 2020-12-23 DIAGNOSIS — M1A9XX1 Chronic gout, unspecified, with tophus (tophi): Secondary | ICD-10-CM

## 2020-12-23 NOTE — Telephone Encounter (Signed)
Patient is requesting that his referral to Rhematology be resent but he has not been seen in our office since 08/14/20. Do we schedule for a sooner f/u appointment before another referral be sent? Please advise.

## 2021-01-01 ENCOUNTER — Telehealth: Payer: Self-pay | Admitting: Podiatry

## 2021-01-01 NOTE — Telephone Encounter (Signed)
Patient called inquiring about referral to Rheumatology, I did inform patient that there are 2 active referrals and he can still be scheduled from those. Patient stated he was told by nurse he needed new referral sent over to Dr.Syed, Please Advise

## 2021-01-05 NOTE — Telephone Encounter (Signed)
New referral was placed to rheumatology and was faxed today.

## 2021-02-11 ENCOUNTER — Inpatient Hospital Stay (HOSPITAL_COMMUNITY)
Admission: EM | Admit: 2021-02-11 | Discharge: 2021-02-15 | DRG: 638 | Disposition: A | Discharge status: Home / Self Care | Payer: Medicaid Other | Attending: Internal Medicine | Admitting: Internal Medicine

## 2021-02-11 ENCOUNTER — Other Ambulatory Visit: Payer: Self-pay

## 2021-02-11 ENCOUNTER — Encounter (HOSPITAL_COMMUNITY): Payer: Self-pay | Admitting: Family Medicine

## 2021-02-11 DIAGNOSIS — R7989 Other specified abnormal findings of blood chemistry: Secondary | ICD-10-CM

## 2021-02-11 DIAGNOSIS — Z791 Long term (current) use of non-steroidal anti-inflammatories (NSAID): Secondary | ICD-10-CM

## 2021-02-11 DIAGNOSIS — D509 Iron deficiency anemia, unspecified: Secondary | ICD-10-CM | POA: Diagnosis present

## 2021-02-11 DIAGNOSIS — E872 Acidosis: Secondary | ICD-10-CM | POA: Diagnosis present

## 2021-02-11 DIAGNOSIS — B20 Human immunodeficiency virus [HIV] disease: Secondary | ICD-10-CM

## 2021-02-11 DIAGNOSIS — Z825 Family history of asthma and other chronic lower respiratory diseases: Secondary | ICD-10-CM

## 2021-02-11 DIAGNOSIS — R112 Nausea with vomiting, unspecified: Secondary | ICD-10-CM

## 2021-02-11 DIAGNOSIS — E785 Hyperlipidemia, unspecified: Secondary | ICD-10-CM | POA: Diagnosis present

## 2021-02-11 DIAGNOSIS — I10 Essential (primary) hypertension: Secondary | ICD-10-CM | POA: Diagnosis present

## 2021-02-11 DIAGNOSIS — Z87891 Personal history of nicotine dependence: Secondary | ICD-10-CM

## 2021-02-11 DIAGNOSIS — Z79899 Other long term (current) drug therapy: Secondary | ICD-10-CM

## 2021-02-11 DIAGNOSIS — F419 Anxiety disorder, unspecified: Secondary | ICD-10-CM | POA: Diagnosis present

## 2021-02-11 DIAGNOSIS — K2100 Gastro-esophageal reflux disease with esophagitis, without bleeding: Secondary | ICD-10-CM

## 2021-02-11 DIAGNOSIS — E11649 Type 2 diabetes mellitus with hypoglycemia without coma: Principal | ICD-10-CM | POA: Diagnosis present

## 2021-02-11 DIAGNOSIS — K3189 Other diseases of stomach and duodenum: Secondary | ICD-10-CM | POA: Diagnosis present

## 2021-02-11 DIAGNOSIS — B259 Cytomegaloviral disease, unspecified: Secondary | ICD-10-CM | POA: Diagnosis present

## 2021-02-11 DIAGNOSIS — Z833 Family history of diabetes mellitus: Secondary | ICD-10-CM

## 2021-02-11 DIAGNOSIS — Z6830 Body mass index (BMI) 30.0-30.9, adult: Secondary | ICD-10-CM

## 2021-02-11 DIAGNOSIS — F1290 Cannabis use, unspecified, uncomplicated: Secondary | ICD-10-CM | POA: Diagnosis present

## 2021-02-11 DIAGNOSIS — R634 Abnormal weight loss: Secondary | ICD-10-CM | POA: Diagnosis present

## 2021-02-11 DIAGNOSIS — R1319 Other dysphagia: Secondary | ICD-10-CM

## 2021-02-11 DIAGNOSIS — B3781 Candidal esophagitis: Secondary | ICD-10-CM | POA: Diagnosis present

## 2021-02-11 DIAGNOSIS — I120 Hypertensive chronic kidney disease with stage 5 chronic kidney disease or end stage renal disease: Secondary | ICD-10-CM | POA: Diagnosis present

## 2021-02-11 DIAGNOSIS — E860 Dehydration: Secondary | ICD-10-CM | POA: Diagnosis present

## 2021-02-11 DIAGNOSIS — E162 Hypoglycemia, unspecified: Secondary | ICD-10-CM

## 2021-02-11 DIAGNOSIS — K648 Other hemorrhoids: Secondary | ICD-10-CM | POA: Diagnosis present

## 2021-02-11 DIAGNOSIS — E1122 Type 2 diabetes mellitus with diabetic chronic kidney disease: Secondary | ICD-10-CM | POA: Diagnosis present

## 2021-02-11 DIAGNOSIS — Z7984 Long term (current) use of oral hypoglycemic drugs: Secondary | ICD-10-CM

## 2021-02-11 DIAGNOSIS — J45909 Unspecified asthma, uncomplicated: Secondary | ICD-10-CM | POA: Diagnosis present

## 2021-02-11 DIAGNOSIS — K5730 Diverticulosis of large intestine without perforation or abscess without bleeding: Secondary | ICD-10-CM | POA: Diagnosis present

## 2021-02-11 DIAGNOSIS — N179 Acute kidney failure, unspecified: Secondary | ICD-10-CM | POA: Diagnosis present

## 2021-02-11 DIAGNOSIS — E871 Hypo-osmolality and hyponatremia: Secondary | ICD-10-CM | POA: Diagnosis present

## 2021-02-11 DIAGNOSIS — IMO0002 Reserved for concepts with insufficient information to code with codable children: Secondary | ICD-10-CM

## 2021-02-11 DIAGNOSIS — E669 Obesity, unspecified: Secondary | ICD-10-CM | POA: Diagnosis present

## 2021-02-11 DIAGNOSIS — D696 Thrombocytopenia, unspecified: Secondary | ICD-10-CM | POA: Diagnosis present

## 2021-02-11 DIAGNOSIS — K922 Gastrointestinal hemorrhage, unspecified: Secondary | ICD-10-CM

## 2021-02-11 DIAGNOSIS — R109 Unspecified abdominal pain: Secondary | ICD-10-CM

## 2021-02-11 DIAGNOSIS — R1314 Dysphagia, pharyngoesophageal phase: Secondary | ICD-10-CM | POA: Diagnosis present

## 2021-02-11 DIAGNOSIS — Z794 Long term (current) use of insulin: Secondary | ICD-10-CM

## 2021-02-11 DIAGNOSIS — Z20822 Contact with and (suspected) exposure to covid-19: Secondary | ICD-10-CM | POA: Diagnosis present

## 2021-02-11 DIAGNOSIS — R059 Cough, unspecified: Secondary | ICD-10-CM

## 2021-02-11 DIAGNOSIS — N186 End stage renal disease: Secondary | ICD-10-CM | POA: Diagnosis present

## 2021-02-11 DIAGNOSIS — D649 Anemia, unspecified: Secondary | ICD-10-CM | POA: Diagnosis present

## 2021-02-11 LAB — URINALYSIS, ROUTINE W REFLEX MICROSCOPIC
Bacteria, UA: NONE SEEN
Bilirubin Urine: NEGATIVE
Glucose, UA: NEGATIVE mg/dL
Ketones, ur: NEGATIVE mg/dL
Leukocytes,Ua: NEGATIVE
Nitrite: NEGATIVE
Protein, ur: 30 mg/dL — AB
Specific Gravity, Urine: 1.006 (ref 1.005–1.030)
pH: 5 (ref 5.0–8.0)

## 2021-02-11 LAB — CBG MONITORING, ED
Glucose-Capillary: 62 mg/dL — ABNORMAL LOW (ref 70–99)
Glucose-Capillary: 55 mg/dL — ABNORMAL LOW (ref 70–99)
Glucose-Capillary: 42 mg/dL — CL (ref 70–99)
Glucose-Capillary: 77 mg/dL (ref 70–99)
Glucose-Capillary: 42 mg/dL — CL (ref 70–99)
Glucose-Capillary: 85 mg/dL (ref 70–99)

## 2021-02-11 LAB — COMPREHENSIVE METABOLIC PANEL
ALT: 14 U/L (ref 0–44)
AST: 35 U/L (ref 15–41)
Albumin: 2.6 g/dL — ABNORMAL LOW (ref 3.5–5.0)
Alkaline Phosphatase: 62 U/L (ref 38–126)
Anion gap: 10 (ref 5–15)
BUN: 85 mg/dL — ABNORMAL HIGH (ref 6–20)
CO2: 17 mmol/L — ABNORMAL LOW (ref 22–32)
Calcium: 8.4 mg/dL — ABNORMAL LOW (ref 8.9–10.3)
Chloride: 103 mmol/L (ref 98–111)
Creatinine, Ser: 4.39 mg/dL — ABNORMAL HIGH (ref 0.61–1.24)
GFR, Estimated: 16 mL/min — ABNORMAL LOW (ref >60)
Glucose, Bld: 56 mg/dL — ABNORMAL LOW (ref 70–99)
Potassium: 4.8 mmol/L (ref 3.5–5.1)
Sodium: 130 mmol/L — ABNORMAL LOW (ref 135–145)
Total Bilirubin: 0.1 mg/dL — ABNORMAL LOW (ref 0.3–1.2)
Total Protein: 7 g/dL (ref 6.5–8.1)

## 2021-02-11 LAB — CBC WITH DIFFERENTIAL/PLATELET
Abs Immature Granulocytes: 0.08 10*3/uL — ABNORMAL HIGH (ref 0.00–0.07)
Basophils Absolute: 0 10*3/uL (ref 0.0–0.1)
Basophils Relative: 0 %
Eosinophils Absolute: 0.2 10*3/uL (ref 0.0–0.5)
Eosinophils Relative: 2 %
HCT: 28.7 % — ABNORMAL LOW (ref 39.0–52.0)
Hemoglobin: 8.9 g/dL — ABNORMAL LOW (ref 13.0–17.0)
Immature Granulocytes: 1 %
Lymphocytes Relative: 3 %
Lymphs Abs: 0.3 10*3/uL — ABNORMAL LOW (ref 0.7–4.0)
MCH: 29.4 pg (ref 26.0–34.0)
MCHC: 31 g/dL (ref 30.0–36.0)
MCV: 94.7 fL (ref 80.0–100.0)
Monocytes Absolute: 0.7 10*3/uL (ref 0.1–1.0)
Monocytes Relative: 7 %
Neutro Abs: 8.7 10*3/uL — ABNORMAL HIGH (ref 1.7–7.7)
Neutrophils Relative %: 87 %
Platelets: 101 10*3/uL — ABNORMAL LOW (ref 150–400)
RBC: 3.03 MIL/uL — ABNORMAL LOW (ref 4.22–5.81)
RDW: 15.3 % (ref 11.5–15.5)
WBC: 10 10*3/uL (ref 4.0–10.5)
nRBC: 0 % (ref 0.0–0.2)

## 2021-02-11 LAB — LIPASE, BLOOD: Lipase: 25 U/L (ref 11–51)

## 2021-02-11 LAB — LACTIC ACID, PLASMA: Lactic Acid, Venous: 1.7 mmol/L (ref 0.5–1.9)

## 2021-02-11 MED ORDER — DEXTROSE 10 % IV SOLN: INTRAVENOUS | Status: DC

## 2021-02-11 MED ORDER — DEXTROSE 50 % IV SOLN
50.0000 mL | Freq: Once | INTRAVENOUS | Status: AC
  Administered 2021-02-11: 50 mL via INTRAVENOUS
  Filled 2021-02-11: qty 50

## 2021-02-11 MED ORDER — DEXTROSE-NACL 10-0.45 % IV SOLN: INTRAVENOUS | Status: DC

## 2021-02-11 MED ORDER — DEXTROSE-NACL 5-0.9 % IV SOLN: INTRAVENOUS | Status: DC

## 2021-02-11 MED ORDER — GLUCAGON HCL RDNA (DIAGNOSTIC) 1 MG IJ SOLR
1.0000 mg | Freq: Once | INTRAMUSCULAR | Status: AC | PRN
  Administered 2021-02-11: 1 mg via INTRAVENOUS
  Filled 2021-02-11: qty 1

## 2021-02-11 NOTE — ED Notes (Signed)
Pt given sandwich and orange juice at this time.

## 2021-02-11 NOTE — ED Triage Notes (Signed)
Pt BIB EMS from home c/o recurrent hypoglycemia. Pt was seen at PCP earlier today and reports 37 CBG. Initial CBG with EMS was 43. 25G oral glucose and 250cc of D10 given PTA. Last CBG 95 with EMS. 20G left hand.

## 2021-02-11 NOTE — ED Provider Notes (Addendum)
[facility] DEPT Provider Note   CSN: [id number] Arrival date & time: 02/11/21  1831     History Chief Complaint  Patient presents with   Hypoglycemia    Bruce Hunter is a 45 y.o. male.  HPI Patient has had recurrent hypoglycemia.  He took his last dose of metformin '500mg'$  and glipizide '5mg'$  yesterday morning.  Patient reports he was treated by EMS twice yesterday for low blood sugar.  He is trying to eat and stop taking his medication.  However blood sugar continued to go low.  He got into the primary care physician office today and was found again to have blood sugar down to 37.  At that time was determined to transfer the patient from PCP office to the emergency department.  Patient was given 25 g oral glucose and 250 cc of D10.  CBG you got up to 95 during transport.  Patient was still ambulatory and awake but slightly drowsy.  There was no focal neurologic deficits by EMS evaluation at the time of transport.  Patient denies any been feeling sick or having fever or vomiting.  He reports that he is very tired now because he was up all night dealing with low blood sugars.    Past Medical History:  Diagnosis Date   Asthma    Diabetes mellitus without complication ([place])    Gout    Hyperlipidemia    Hypertension    Obesity     There are no problems to display for this patient.   Past Surgical History:  Procedure Laterality Date   GANGLION CYST EXCISION Right 02/26/2020   Procedure: EXCISION TOPHUS RIGHT FOOT;  Surgeon: Evelina Bucy, DPM;  Location: [facility];  Service: Podiatry;  Laterality: Right;   NO PAST SURGERIES         Family History  Problem Relation Age of Onset   Asthma Mother    Cancer Mother    Diabetes Mother    Obesity Mother    Obesity Sister    Diabetes Brother    Obesity Brother    Cancer Maternal Grandmother    Diabetes Maternal Grandmother    Diabetes Other     Social History   Tobacco Use    Smoking status: Every Day    Packs/day: 0.50    Pack years: 0.00    Types: Cigarettes   Smokeless tobacco: Never  Substance Use Topics   Alcohol use: Yes   Drug use: Yes    Frequency: 1.0 times per week    Types: Marijuana    Comment: marijuana at midnight last night    Home Medications Prior to Admission medications   Medication Sig Start Date End Date Taking? Authorizing Provider  ACCU-CHEK GUIDE test strip 2 (two) times daily. 09/24/20   [provider]  Accu-Chek Softclix Lancets lancets 2 (two) times daily. 09/30/20   [provider]  amLODipine (NORVASC) 10 MG tablet Take 10 mg by mouth daily.    [provider]  clonazePAM (KLONOPIN) 0.5 MG tablet Take 0.5 mg by mouth 3 (three) times daily as needed. 10/28/20   [provider]  Diclofenac-miSOPROStol 75-0.2 MG TBEC Take 1 tablet by mouth 2 (two) times daily. 09/24/20   [provider]  gabapentin (NEURONTIN) 600 MG tablet Take 600 mg by mouth 3 (three) times daily. 03/20/20   [provider]  glipiZIDE (GLUCOTROL) 5 MG tablet Take 5 mg by mouth 2 (two) times daily. 09/29/20  [provider]  hydrochlorothiazide (HYDRODIURIL) 12.5 MG tablet Take 12.5 mg by mouth daily.    [provider]  hydrochlorothiazide (HYDRODIURIL) 25 MG tablet Take 25 mg by mouth daily. 08/31/20   [provider]  HYDROcodone-acetaminophen (NORCO/VICODIN) 5-325 MG tablet Take 1 tablet by mouth 2 (two) times daily as needed. 10/28/20   [provider]  hydrocortisone 2.5 % cream Apply topically. 11/06/20   [provider]  lisinopril (ZESTRIL) 20 MG tablet Take 20 mg by mouth daily. 10/06/20   [provider]  meloxicam (MOBIC) 15 MG tablet TAKE 1 TABLET(15 MG) BY MOUTH DAILY 09/14/20   Evelina Bucy, DPM  metFORMIN (GLUCOPHAGE) 500 MG tablet Take by mouth 2 (two) times daily with a meal.    [provider]  miconazole (MICOTIN) 2 % cream Apply  topically. 06/26/20   [provider]  MITIGARE 0.6 MG CAPS Take 1 capsule by mouth daily. 11/09/20   [provider]  mometasone (ELOCON) 0.1 % lotion Apply topically. 11/09/20   [provider]  omeprazole (PRILOSEC) 20 MG capsule Take 20 mg by mouth daily. 04/09/20   [provider]  traMADol (ULTRAM) 50 MG tablet Take by mouth every 6 (six) hours as needed.    [provider]  zolpidem (AMBIEN) 5 MG tablet Take 5 mg by mouth at bedtime as needed. 07/09/20   [provider]    Allergies    Patient has no known allergies.  Review of Systems   Review of Systems 10 systems reviewed and negative except as per HPI Physical Exam Updated Vital Signs BP 108/66   Pulse 84   Temp 98.7 F (37.1 C) (Oral)   Resp (!) 22   SpO2 99%   Physical Exam Constitutional:      Comments: Patient is very mildly drowsy on arrival but was appropriately responsive and oriented.  No respiratory distress.  Nontoxic.  HENT:     Head: Normocephalic and atraumatic.     Mouth/Throat:     Mouth: Mucous membranes are moist.     Pharynx: Oropharynx is clear.  Eyes:     Extraocular Movements: Extraocular movements intact.     Conjunctiva/sclera: Conjunctivae normal.     Pupils: Pupils are equal, round, and reactive to light.  Cardiovascular:     Rate and Rhythm: Normal rate and regular rhythm.  Pulmonary:     Effort: Pulmonary effort is normal.     Breath sounds: Normal breath sounds.  Abdominal:     General: There is no distension.     Palpations: Abdomen is soft.     Tenderness: There is no abdominal tenderness. There is no guarding.  Musculoskeletal:        General: No swelling or tenderness. Normal range of motion.     Cervical back: Neck supple.     Right lower leg: No edema.     Left lower leg: No edema.  Skin:    General: Skin is warm and dry.  Neurological:     General: No focal deficit present.     Mental Status: He is oriented to person,  place, and time.     Motor: No weakness.     Coordination: Coordination normal.    ED Results / Procedures / Treatments   Labs (all labs ordered are listed, but only abnormal results are displayed) Labs Reviewed  CBC WITH DIFFERENTIAL/PLATELET - Abnormal; Notable for the following components:      Result Value   RBC  3.03 (*)    Hemoglobin 8.9 (*)    HCT 28.7 (*)    Platelets 101 (*)    Neutro Abs 8.7 (*)    Lymphs Abs 0.3 (*)    Abs Immature Granulocytes 0.08 (*)    All other components within normal limits  CBG MONITORING, ED - Abnormal; Notable for the following components:   Glucose-Capillary 62 (*)    All other components within normal limits  CBG MONITORING, ED - Abnormal; Notable for the following components:   Glucose-Capillary 55 (*)    All other components within normal limits  LACTIC ACID, PLASMA  URINALYSIS, ROUTINE W REFLEX MICROSCOPIC  COMPREHENSIVE METABOLIC PANEL  LIPASE, BLOOD  CBG MONITORING, ED    EKG None EKG has mismatch issue.  No acute ischemic appearance.  Sinus rhythm. Radiology No results found.  Procedures Procedures  CRITICAL CARE Performed by: Charlesetta Shanks   Total critical care time: 30 minutes  Critical care time was exclusive of separately billable procedures and treating other patients.  Critical care was necessary to treat or prevent imminent or life-threatening deterioration.  Critical care was time spent personally by me on the following activities: development of treatment plan with patient and/or surrogate as well as nursing, discussions with consultants, evaluation of patient's response to treatment, examination of patient, obtaining history from patient or surrogate, ordering and performing treatments and interventions, ordering and review of laboratory studies, ordering and review of radiographic studies, pulse oximetry and re-evaluation of patient's condition.  Medications Ordered in ED Medications  dextrose 5 %-0.9 %  sodium chloride infusion ( Intravenous New Bag/Given 02/11/21 1931)    ED Course  I have reviewed the triage vital signs and the nursing notes.  Pertinent labs & imaging results that were available during my care of the patient were reviewed by me and considered in my medical decision making (see chart for details).  Consult: Admit to [facility] hospitalist    MDM Rules/Calculators/A&P                         Patient presents with recurrent hypoglycemia.  This has been going on since yesterday.  Patient's blood sugar despite reportedly stopping his oral antihyperglycemic agents was down to 37.  Patient was still ambulatory and intact cognitively.  He is transferred to the emergency department by EMS.  Patient has again had hypoglycemia.  He has eaten part of a sandwich and some snack.  He has had the 5 normal saline running and also D10 with continued hypoglycemia.  Patient is required additional amp of D50.  Patient will need to be admitted for inpatient management of persistent hypoglycemia suspected due to oral agents. Final Clinical Impression(s) / ED Diagnoses Final diagnoses:  Hypoglycemia    Rx / DC Orders ED Discharge Orders     None        Charlesetta Shanks, MD 02/11/21 2155    Charlesetta Shanks, MD 02/11/21 2242

## 2021-02-12 ENCOUNTER — Observation Stay (HOSPITAL_COMMUNITY): Payer: Medicaid Other

## 2021-02-12 ENCOUNTER — Inpatient Hospital Stay (HOSPITAL_COMMUNITY): Payer: Medicaid Other

## 2021-02-12 DIAGNOSIS — E860 Dehydration: Secondary | ICD-10-CM | POA: Diagnosis present

## 2021-02-12 DIAGNOSIS — E119 Type 2 diabetes mellitus without complications: Secondary | ICD-10-CM | POA: Diagnosis not present

## 2021-02-12 DIAGNOSIS — D649 Anemia, unspecified: Secondary | ICD-10-CM

## 2021-02-12 DIAGNOSIS — B259 Cytomegaloviral disease, unspecified: Secondary | ICD-10-CM | POA: Diagnosis present

## 2021-02-12 DIAGNOSIS — B3781 Candidal esophagitis: Secondary | ICD-10-CM | POA: Diagnosis present

## 2021-02-12 DIAGNOSIS — J45909 Unspecified asthma, uncomplicated: Secondary | ICD-10-CM | POA: Diagnosis present

## 2021-02-12 DIAGNOSIS — R059 Cough, unspecified: Secondary | ICD-10-CM | POA: Diagnosis not present

## 2021-02-12 DIAGNOSIS — N186 End stage renal disease: Secondary | ICD-10-CM | POA: Diagnosis present

## 2021-02-12 DIAGNOSIS — E669 Obesity, unspecified: Secondary | ICD-10-CM | POA: Diagnosis present

## 2021-02-12 DIAGNOSIS — K2100 Gastro-esophageal reflux disease with esophagitis, without bleeding: Secondary | ICD-10-CM | POA: Diagnosis present

## 2021-02-12 DIAGNOSIS — E872 Acidosis: Secondary | ICD-10-CM | POA: Diagnosis present

## 2021-02-12 DIAGNOSIS — B20 Human immunodeficiency virus [HIV] disease: Secondary | ICD-10-CM | POA: Diagnosis present

## 2021-02-12 DIAGNOSIS — I10 Essential (primary) hypertension: Secondary | ICD-10-CM | POA: Diagnosis not present

## 2021-02-12 DIAGNOSIS — F419 Anxiety disorder, unspecified: Secondary | ICD-10-CM | POA: Diagnosis present

## 2021-02-12 DIAGNOSIS — N179 Acute kidney failure, unspecified: Secondary | ICD-10-CM | POA: Diagnosis present

## 2021-02-12 DIAGNOSIS — E162 Hypoglycemia, unspecified: Secondary | ICD-10-CM

## 2021-02-12 DIAGNOSIS — R1319 Other dysphagia: Secondary | ICD-10-CM | POA: Diagnosis not present

## 2021-02-12 DIAGNOSIS — E1122 Type 2 diabetes mellitus with diabetic chronic kidney disease: Secondary | ICD-10-CM | POA: Diagnosis present

## 2021-02-12 DIAGNOSIS — R634 Abnormal weight loss: Secondary | ICD-10-CM | POA: Diagnosis present

## 2021-02-12 DIAGNOSIS — Z6830 Body mass index (BMI) 30.0-30.9, adult: Secondary | ICD-10-CM | POA: Diagnosis not present

## 2021-02-12 DIAGNOSIS — I120 Hypertensive chronic kidney disease with stage 5 chronic kidney disease or end stage renal disease: Secondary | ICD-10-CM | POA: Diagnosis present

## 2021-02-12 DIAGNOSIS — R109 Unspecified abdominal pain: Secondary | ICD-10-CM | POA: Diagnosis not present

## 2021-02-12 DIAGNOSIS — Z7984 Long term (current) use of oral hypoglycemic drugs: Secondary | ICD-10-CM | POA: Diagnosis not present

## 2021-02-12 DIAGNOSIS — D500 Iron deficiency anemia secondary to blood loss (chronic): Secondary | ICD-10-CM | POA: Diagnosis not present

## 2021-02-12 DIAGNOSIS — D696 Thrombocytopenia, unspecified: Secondary | ICD-10-CM | POA: Diagnosis present

## 2021-02-12 DIAGNOSIS — E785 Hyperlipidemia, unspecified: Secondary | ICD-10-CM | POA: Diagnosis present

## 2021-02-12 DIAGNOSIS — D509 Iron deficiency anemia, unspecified: Secondary | ICD-10-CM | POA: Diagnosis present

## 2021-02-12 DIAGNOSIS — E11649 Type 2 diabetes mellitus with hypoglycemia without coma: Secondary | ICD-10-CM | POA: Diagnosis present

## 2021-02-12 DIAGNOSIS — Z20822 Contact with and (suspected) exposure to covid-19: Secondary | ICD-10-CM | POA: Diagnosis present

## 2021-02-12 DIAGNOSIS — E871 Hypo-osmolality and hyponatremia: Secondary | ICD-10-CM

## 2021-02-12 LAB — RAPID URINE DRUG SCREEN, HOSP PERFORMED
Amphetamines: NOT DETECTED
Barbiturates: NOT DETECTED
Benzodiazepines: NOT DETECTED
Cocaine: NOT DETECTED
Opiates: POSITIVE — AB
Tetrahydrocannabinol: POSITIVE — AB

## 2021-02-12 LAB — COMPREHENSIVE METABOLIC PANEL
ALT: 15 U/L (ref 0–44)
AST: 32 U/L (ref 15–41)
Albumin: 2.6 g/dL — ABNORMAL LOW (ref 3.5–5.0)
Alkaline Phosphatase: 61 U/L (ref 38–126)
Anion gap: 10 (ref 5–15)
BUN: 83 mg/dL — ABNORMAL HIGH (ref 6–20)
CO2: 16 mmol/L — ABNORMAL LOW (ref 22–32)
Calcium: 8.2 mg/dL — ABNORMAL LOW (ref 8.9–10.3)
Chloride: 100 mmol/L (ref 98–111)
Creatinine, Ser: 3.87 mg/dL — ABNORMAL HIGH (ref 0.61–1.24)
GFR, Estimated: 19 mL/min — ABNORMAL LOW (ref >60)
Glucose, Bld: 126 mg/dL — ABNORMAL HIGH (ref 70–99)
Potassium: 4.9 mmol/L (ref 3.5–5.1)
Sodium: 126 mmol/L — ABNORMAL LOW (ref 135–145)
Total Bilirubin: 0.3 mg/dL (ref 0.3–1.2)
Total Protein: 6.9 g/dL (ref 6.5–8.1)

## 2021-02-12 LAB — VITAMIN B12: Vitamin B-12: 267 pg/mL (ref 180–914)

## 2021-02-12 LAB — CBC
HCT: 24.7 % — ABNORMAL LOW (ref 39.0–52.0)
Hemoglobin: 7.8 g/dL — ABNORMAL LOW (ref 13.0–17.0)
MCH: 28.6 pg (ref 26.0–34.0)
MCHC: 31.6 g/dL (ref 30.0–36.0)
MCV: 90.5 fL (ref 80.0–100.0)
Platelets: 92 10*3/uL — ABNORMAL LOW (ref 150–400)
RBC: 2.73 MIL/uL — ABNORMAL LOW (ref 4.22–5.81)
RDW: 14.6 % (ref 11.5–15.5)
WBC: 9.4 10*3/uL (ref 4.0–10.5)
nRBC: 0 % (ref 0.0–0.2)

## 2021-02-12 LAB — RETICULOCYTES
Immature Retic Fract: 8.7 % (ref 2.3–15.9)
RBC.: 2.68 MIL/uL — ABNORMAL LOW (ref 4.22–5.81)
Retic Count, Absolute: 22.5 10*3/uL (ref 19.0–186.0)
Retic Ct Pct: 0.8 % (ref 0.4–3.1)

## 2021-02-12 LAB — OCCULT BLOOD X 1 CARD TO LAB, STOOL: Fecal Occult Bld: POSITIVE — AB

## 2021-02-12 LAB — GLUCOSE, CAPILLARY
Glucose-Capillary: 109 mg/dL — ABNORMAL HIGH (ref 70–99)
Glucose-Capillary: 117 mg/dL — ABNORMAL HIGH (ref 70–99)
Glucose-Capillary: 117 mg/dL — ABNORMAL HIGH (ref 70–99)
Glucose-Capillary: 118 mg/dL — ABNORMAL HIGH (ref 70–99)
Glucose-Capillary: 138 mg/dL — ABNORMAL HIGH (ref 70–99)
Glucose-Capillary: 83 mg/dL (ref 70–99)

## 2021-02-12 LAB — IRON AND TIBC
Iron: 20 ug/dL — ABNORMAL LOW (ref 45–182)
Saturation Ratios: 9 % — ABNORMAL LOW (ref 17.9–39.5)
TIBC: 216 ug/dL — ABNORMAL LOW (ref 250–450)
UIBC: 196 ug/dL

## 2021-02-12 LAB — FERRITIN: Ferritin: 1221 ng/mL — ABNORMAL HIGH (ref 24–336)

## 2021-02-12 LAB — TSH: TSH: 0.43 u[IU]/mL (ref 0.350–4.500)

## 2021-02-12 LAB — FOLATE: Folate: 4.8 ng/mL — ABNORMAL LOW (ref >5.9)

## 2021-02-12 LAB — MAGNESIUM: Magnesium: 1.4 mg/dL — ABNORMAL LOW (ref 1.7–2.4)

## 2021-02-12 LAB — HIV ANTIBODY (ROUTINE TESTING W REFLEX): HIV Screen 4th Generation wRfx: REACTIVE — AB

## 2021-02-12 LAB — RESP PANEL BY RT-PCR (FLU A&B, COVID) ARPGX2
Influenza A by PCR: NEGATIVE
Influenza B by PCR: NEGATIVE
SARS Coronavirus 2 by RT PCR: NEGATIVE

## 2021-02-12 LAB — HEPATITIS PANEL, ACUTE
HCV Ab: NONREACTIVE
Hep A IgM: NONREACTIVE
Hep B C IgM: NONREACTIVE
Hepatitis B Surface Ag: NONREACTIVE

## 2021-02-12 IMAGING — US US RENAL
1 series · 14 of 25 positions shown · non-contrast
Comparison: None.

CLINICAL DATA: Elevated creatinine.

EXAM:
RENAL / URINARY TRACT ULTRASOUND COMPLETE

[Series 1: us renal · 14 of 68 slices shown]
[im 1/68]
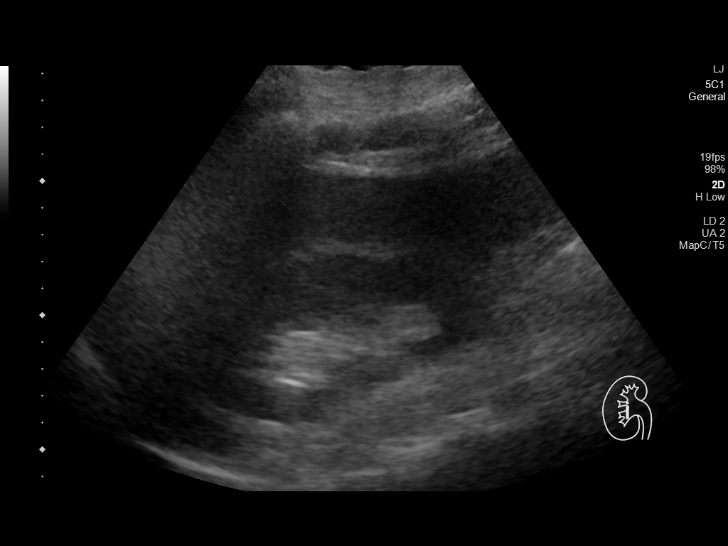
[im 6/68]
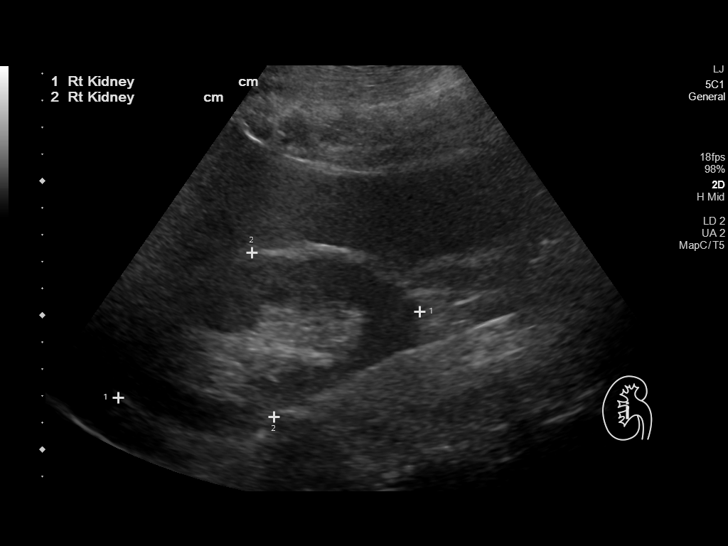
[im 12/68]
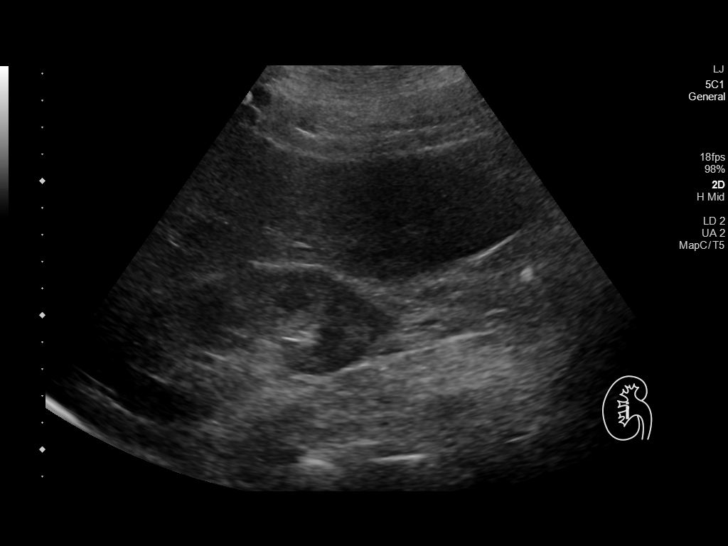
[im 17/68]
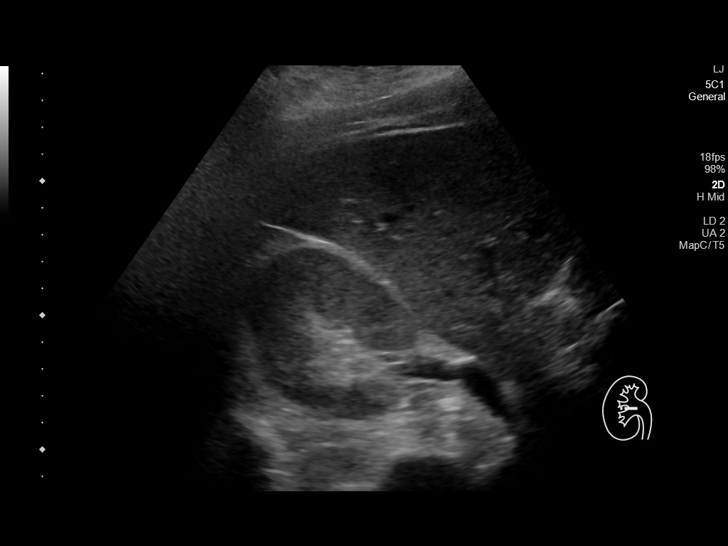
[im 23/68]
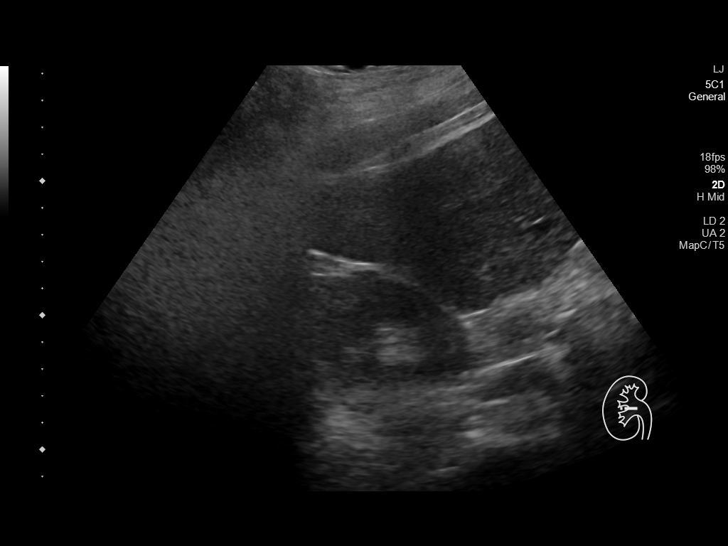
[im 26/68]
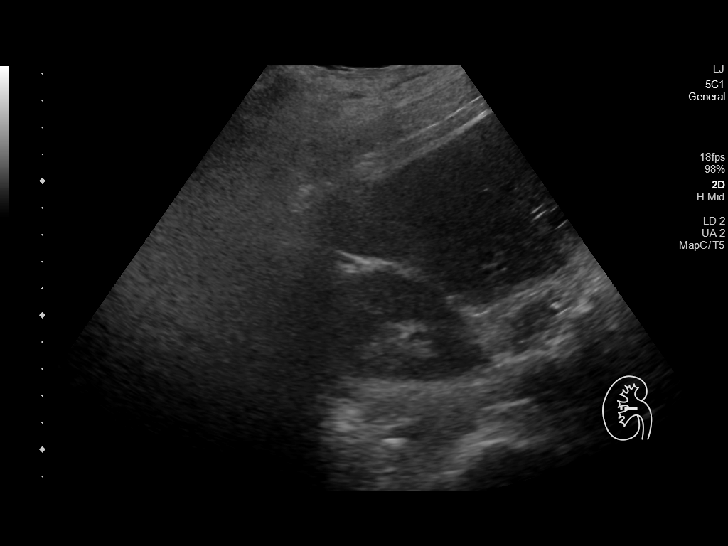
[im 31/68]
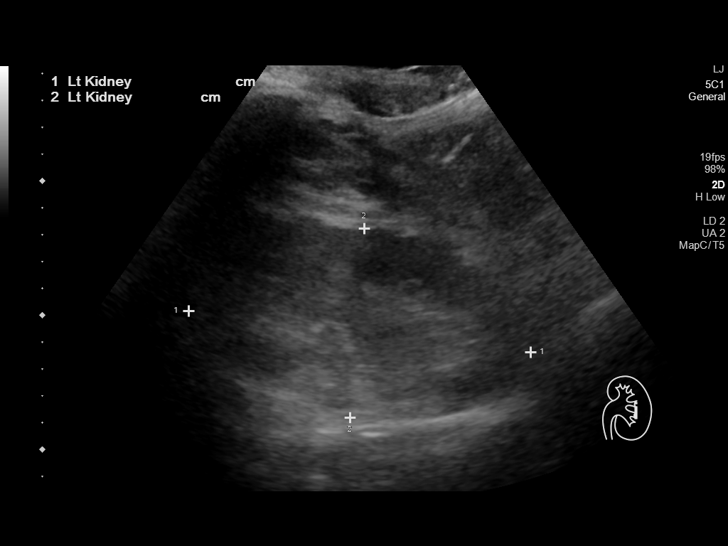
[im 37/68]
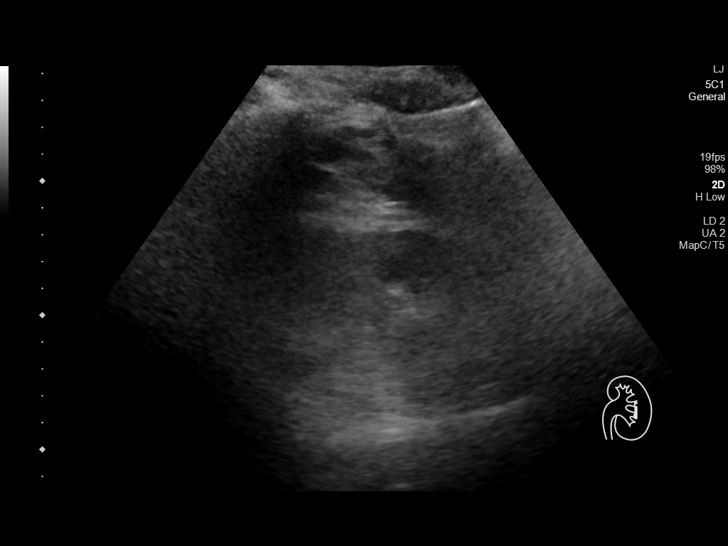
[im 42/68]
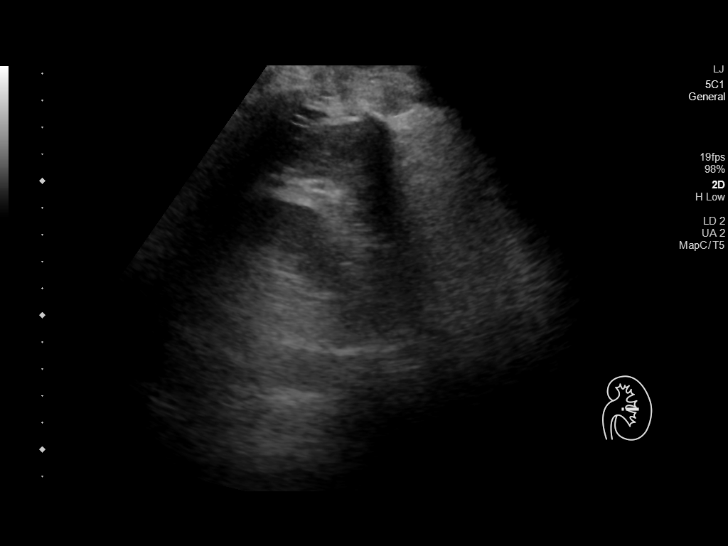
[im 45/68]
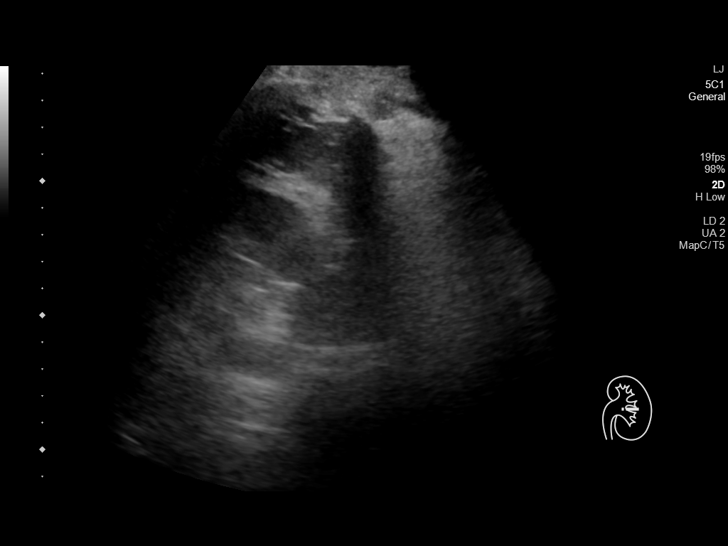
[im 51/68]
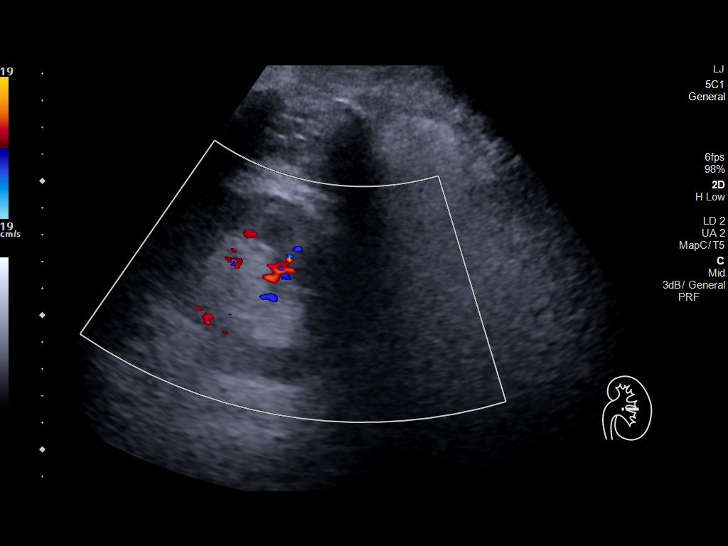
[im 56/68]
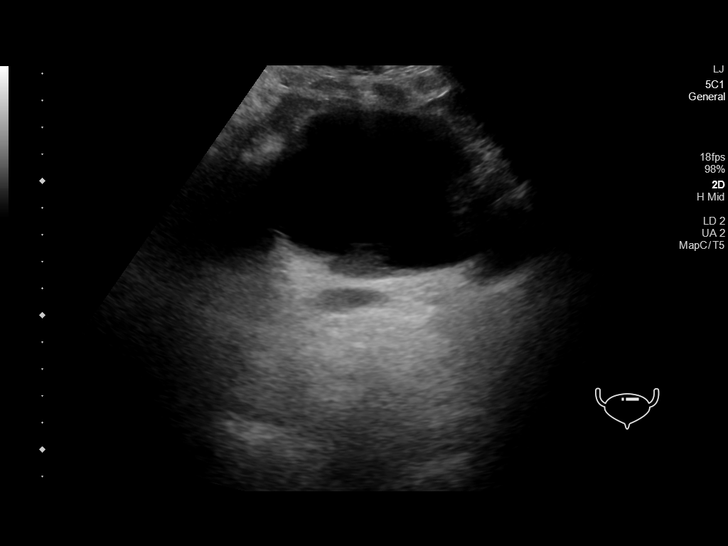
[im 62/68]
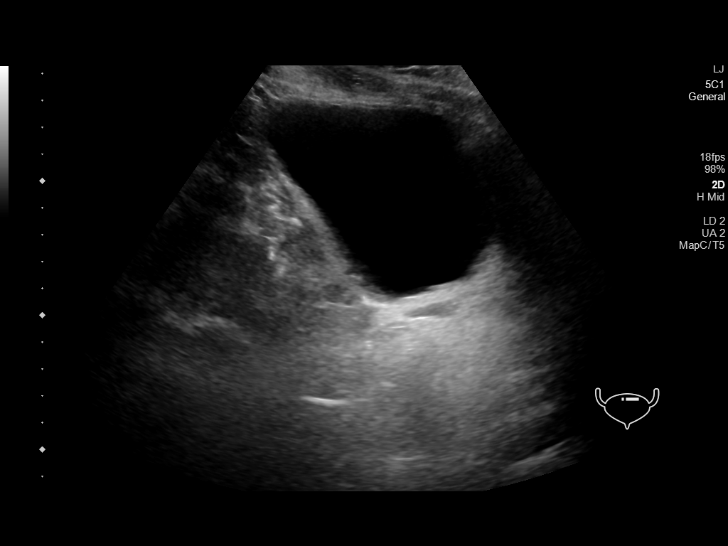
[im 68/68]
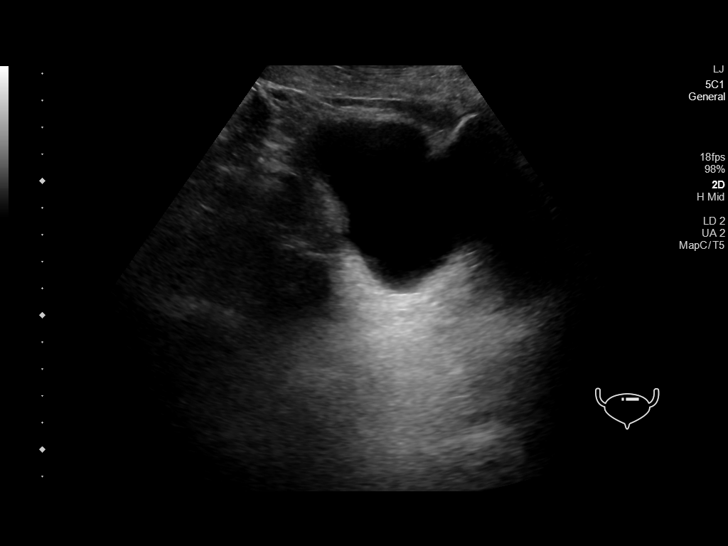

[14 of 25 positions shown; findings below may reference images not displayed]

FINDINGS: Right Kidney:

Renal measurements: 9.7 x 6.2 x 6.5 cm = volume: 244 mL.
Echogenicity within normal limits. No mass or hydronephrosis
visualized.

Left Kidney:

Renal measurements: 12.8 x 7.1 x 6.0 cm = volume: 285 mL.
Echogenicity within normal limits. No mass or hydronephrosis
visualized.

Bladder:

Appears normal for degree of bladder distention. Bilateral ureteral
jets visualized.

Other:

Evaluation limited by patient respiratory motion.
IMPRESSION: No hydronephrosis.

## 2021-02-12 IMAGING — DX DG ABDOMEN 1V
2 series · 2 of 2 positions shown · non-contrast
Comparison: None.

CLINICAL DATA: Nausea, vomiting and abdominal pain.

EXAM:
ABDOMEN - 1 VIEW

[abdomen kub (1 of 2)]
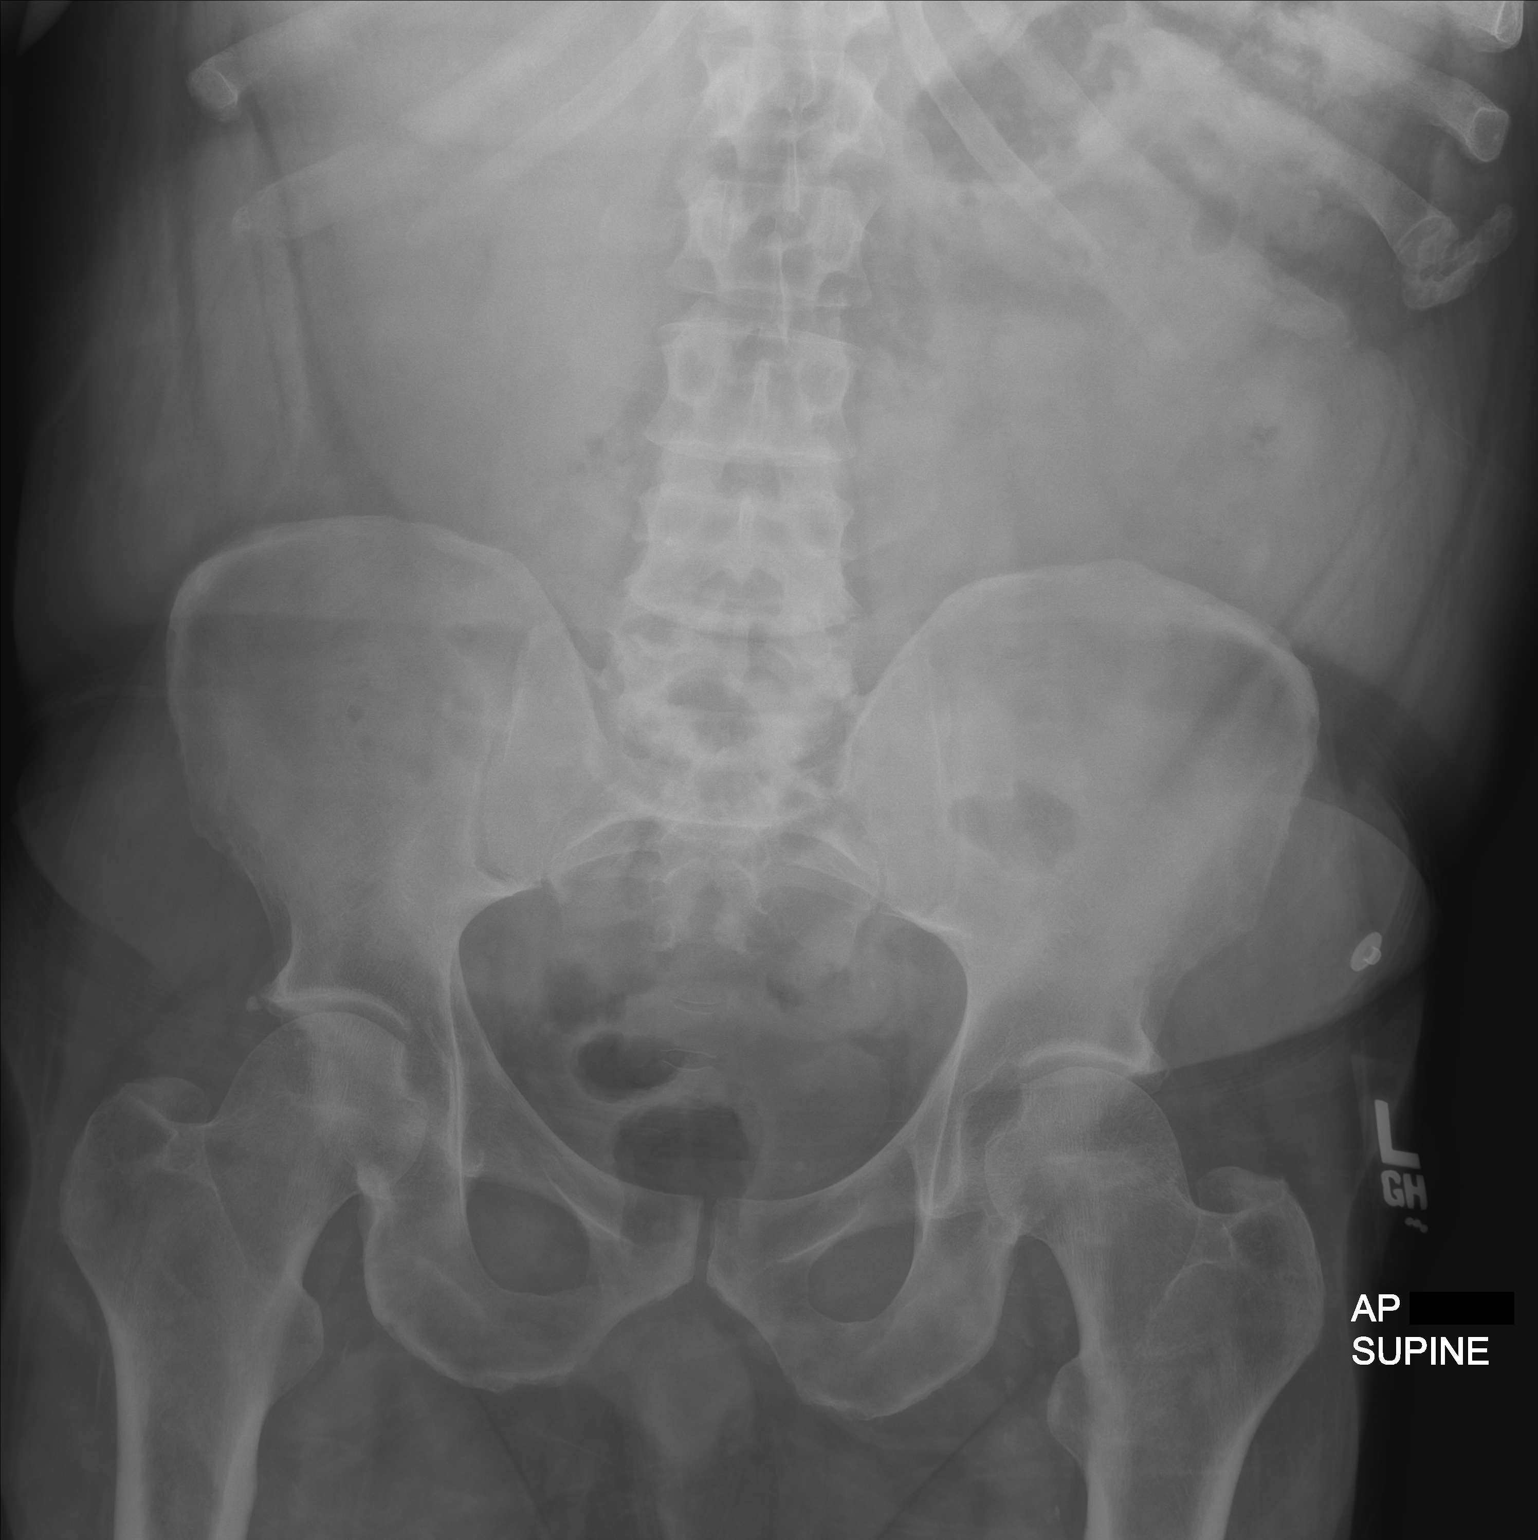

[abdomen kub (2 of 2)]
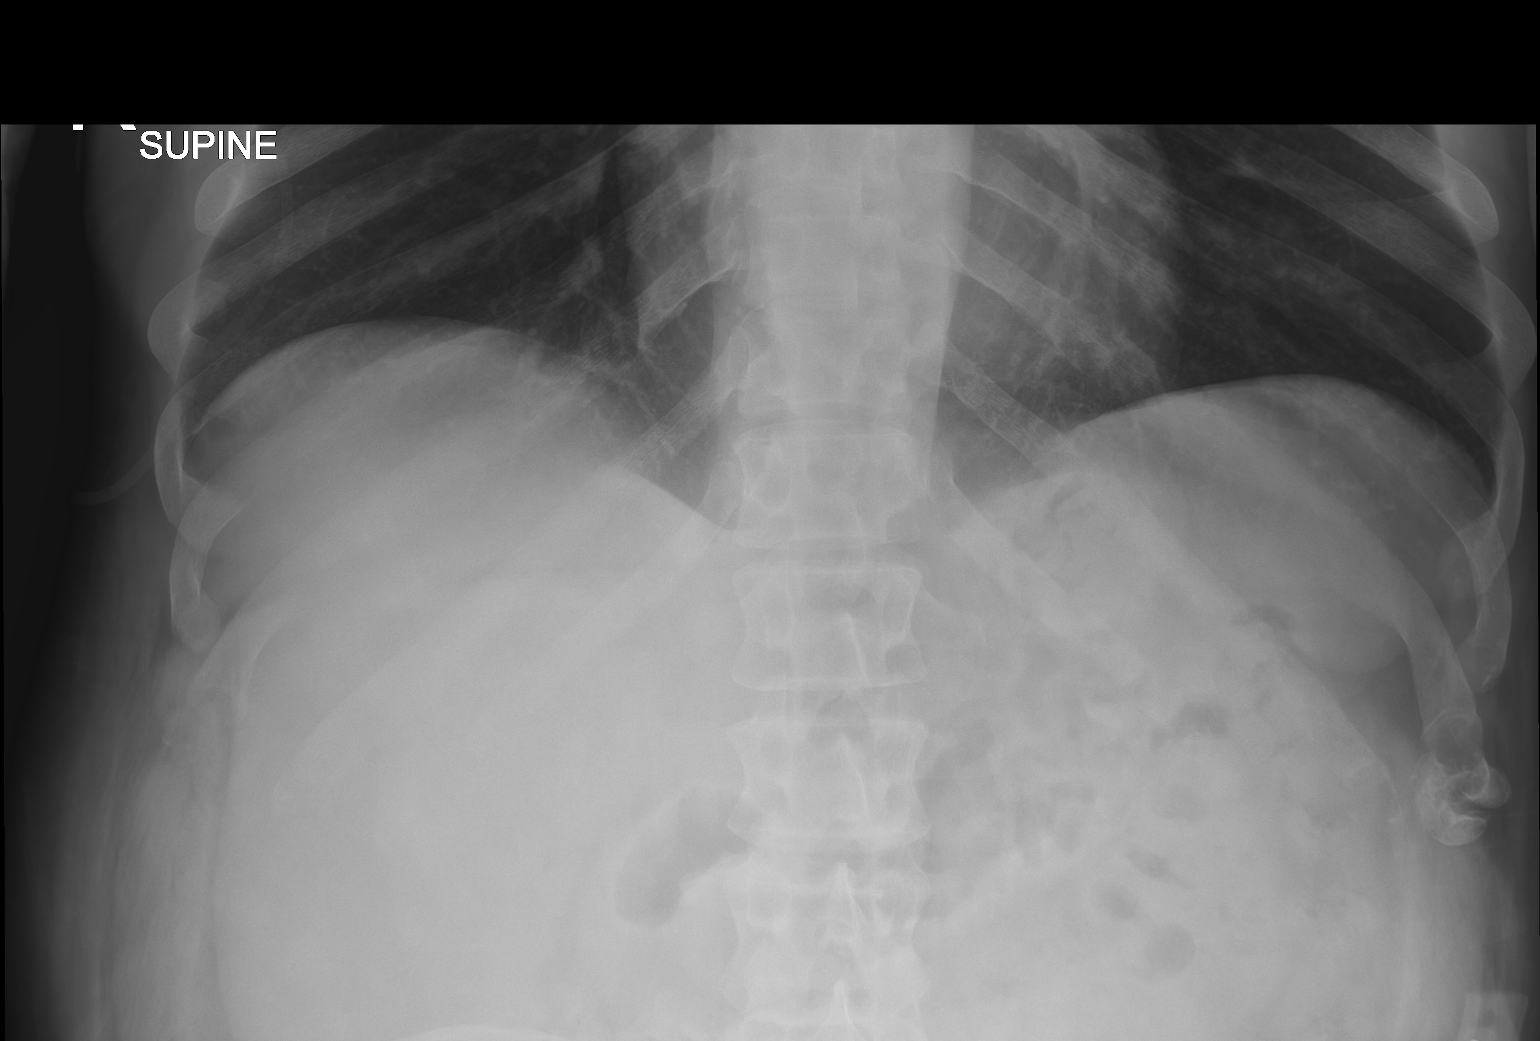

[2 of 2 positions shown; findings below may reference images not displayed]

FINDINGS: The bowel gas pattern is normal. No radio-opaque calculi or other
significant radiographic abnormality are seen.
IMPRESSION: Negative exam.

## 2021-02-12 IMAGING — CT CT ABD-PELV W/O CM
2 of 4 series · 16 of 46 positions shown, 18 images · non-contrast
Comparison: None.

CLINICAL DATA: Hypoglycemia, nausea and vomiting.

EXAM:
CT ABDOMEN AND PELVIS WITHOUT CONTRAST
TECHNIQUE: Multidetector CT imaging of the abdomen and pelvis was performed
following the standard protocol without IV contrast.

[Series 2: axial st · axial · 0.87mm/px · z∈[+1152,+1582]mm · 13 of 100 slices shown, 15 images]
[im 7/100  soft-tissue]
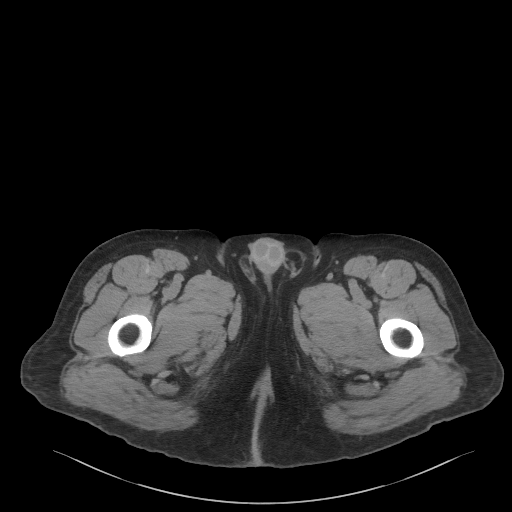
[im 7/100  bone]
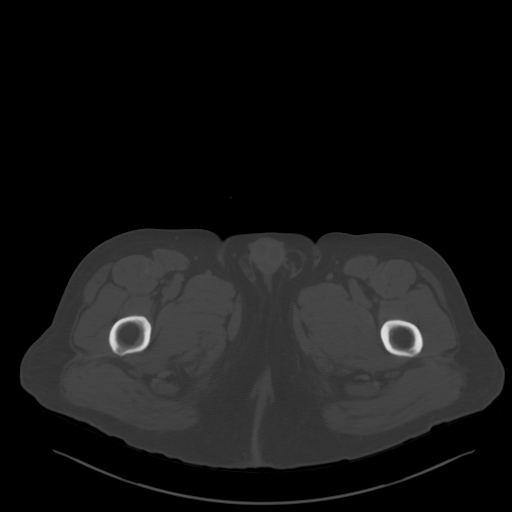
[im 13/100  soft-tissue]
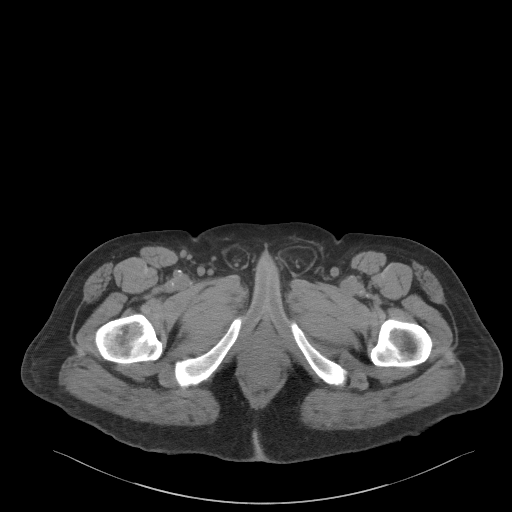
[im 19/100  soft-tissue]
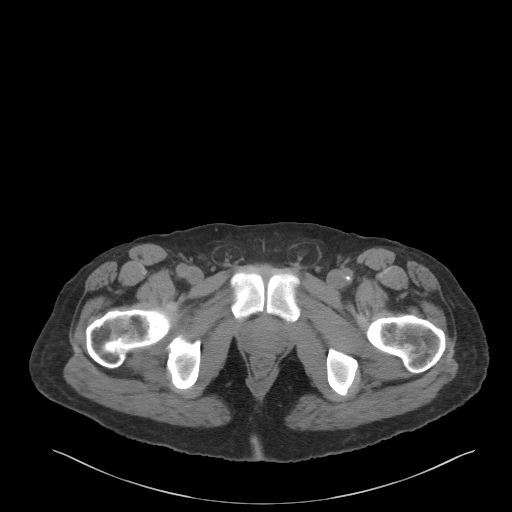
[im 31/100  soft-tissue]
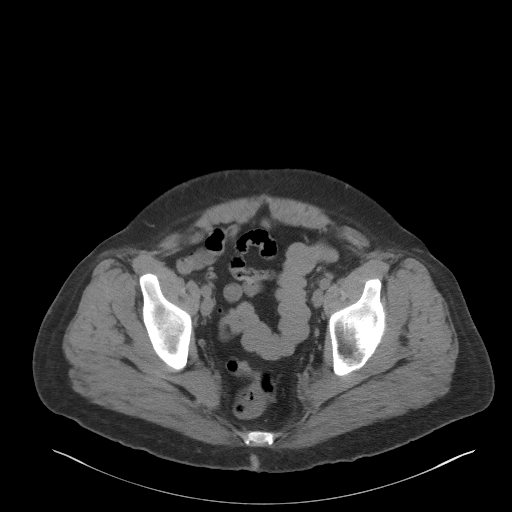
[im 38/100  soft-tissue]
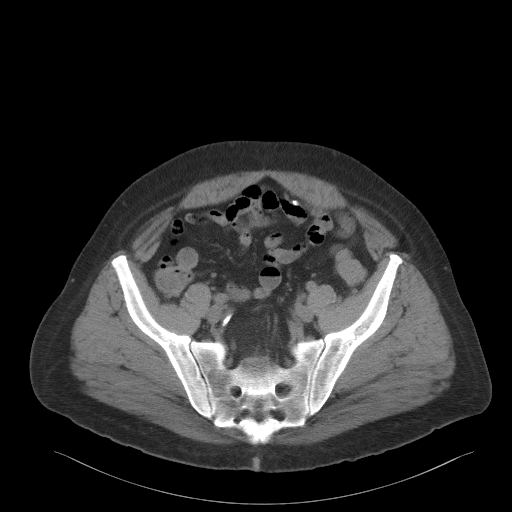
[im 44/100  soft-tissue]
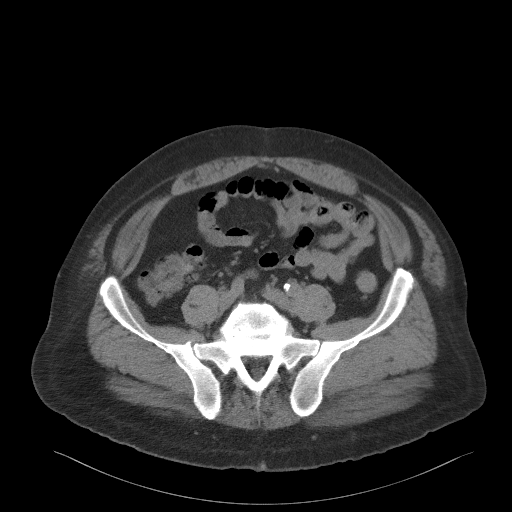
[im 50/100  soft-tissue]
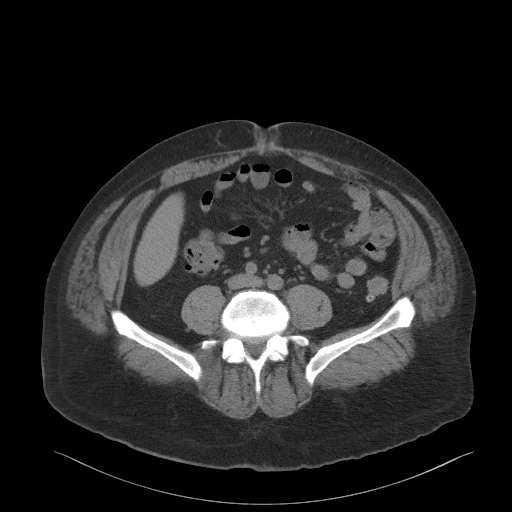
[im 56/100  soft-tissue]
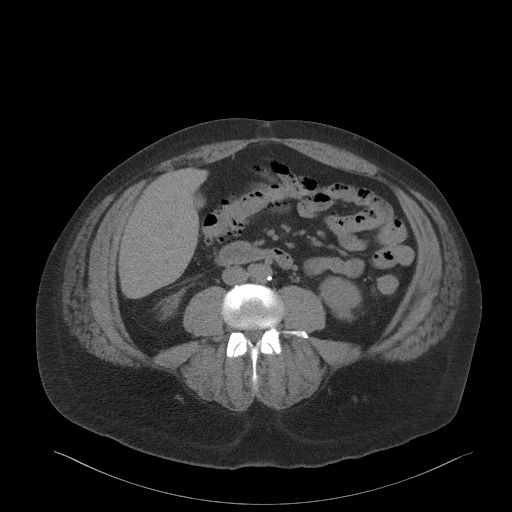
[im 62/100  soft-tissue]
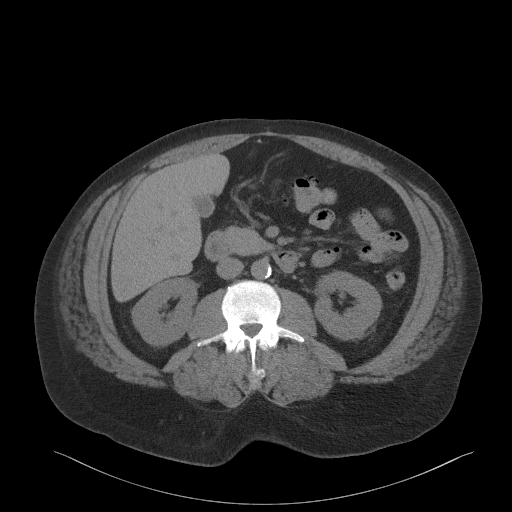
[im 62/100  bone]
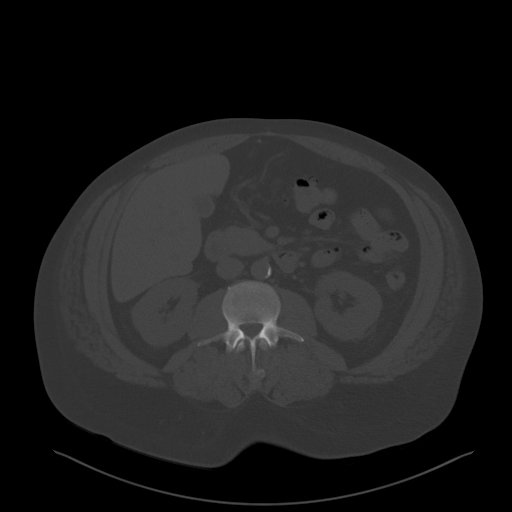
[im 69/100  soft-tissue]
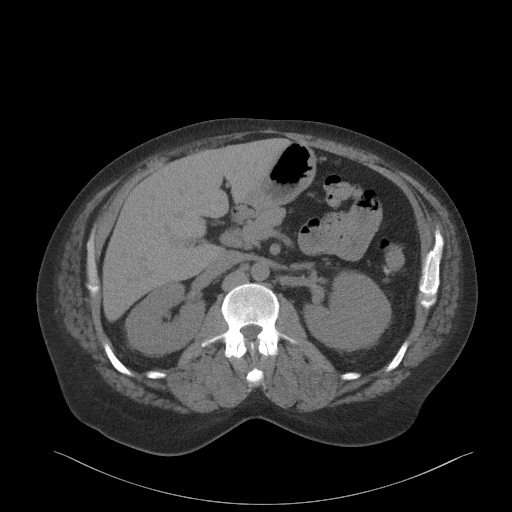
[im 81/100  soft-tissue]
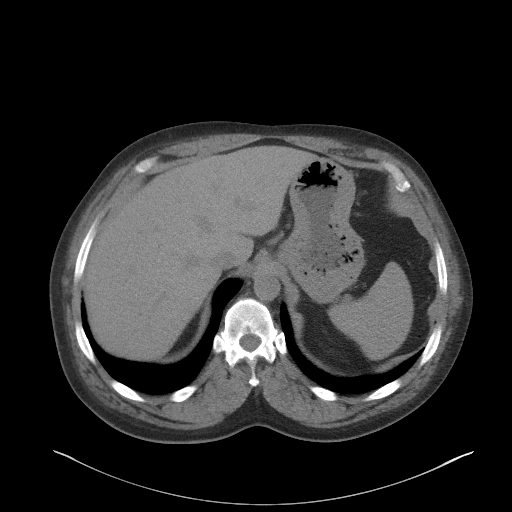
[im 87/100  soft-tissue]
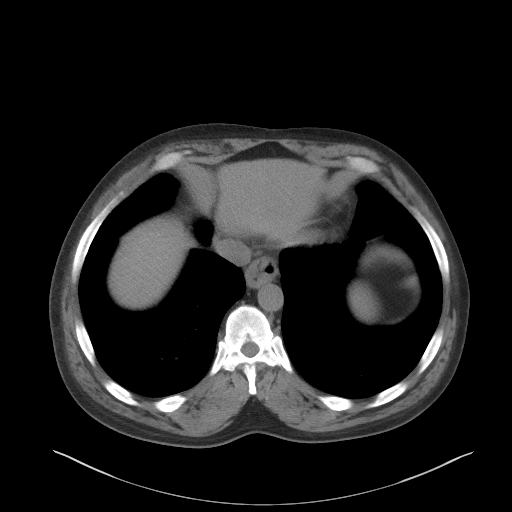
[im 93/100  soft-tissue]
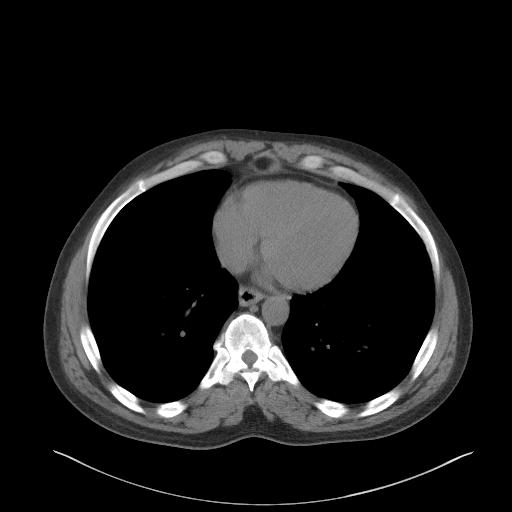

[Series 4: coronal st · coronal · 0.87mm/px · 3 of 106 slices shown]
[im 36/106  soft-tissue]
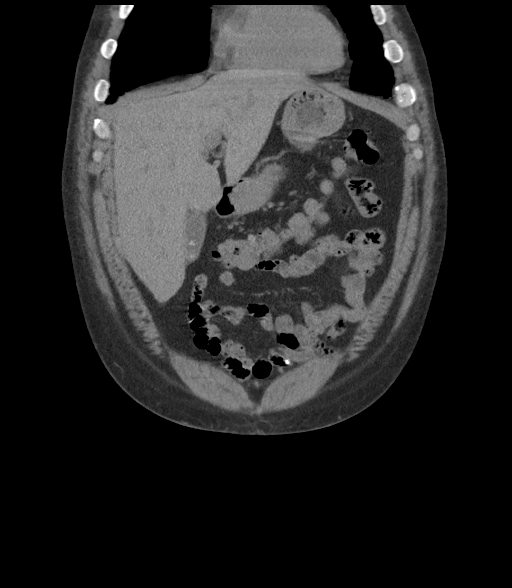
[im 47/106  soft-tissue]
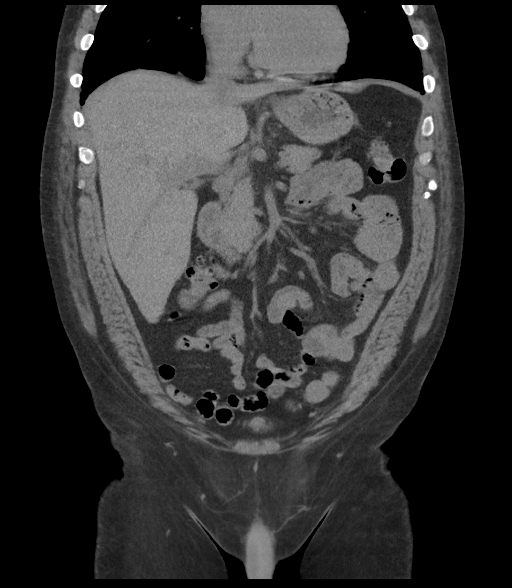
[im 59/106  soft-tissue]
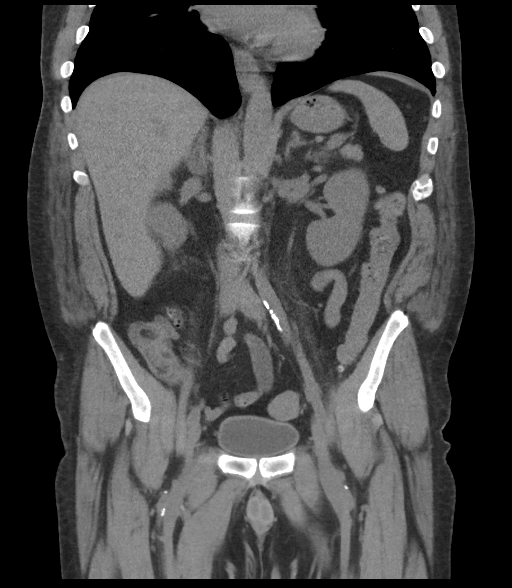

[16 of 46 positions shown; findings below may reference images not displayed]

FINDINGS: Lower chest: Minimal patchy opacity in the right lower lobe. Normal
sized heart.

Hepatobiliary: Small number of tiny gallstones in the gallbladder.
Poorly distended gallbladder with no unexpected wall thickening or
pericholecystic fluid. Unremarkable liver.

Pancreas: Unremarkable. No pancreatic ductal dilatation or
surrounding inflammatory changes.

Spleen: Normal in size without focal abnormality.

Adrenals/Urinary Tract: Adrenal glands are unremarkable. Kidneys are
normal, without renal calculi, focal lesion, or hydronephrosis.
Bladder is unremarkable.

Stomach/Bowel: Extensive colonic diverticulosis without evidence of
diverticulitis. Normal appearing appendix. Small hiatal hernia.
Unremarkable small bowel.

Vascular/Lymphatic: Mild atheromatous arterial calcifications. No
enlarged lymph nodes.

Reproductive: Prostate is unremarkable.

Other: 2 small supraumbilical hernias containing fat. Small to
moderate-sized bilateral inguinal hernias containing fat, left
larger than right.

Musculoskeletal: Lumbosacral spine degenerative changes and mild
lower thoracic spine degenerative changes.
IMPRESSION: 1. No acute abnormality.
2. Small hiatal hernia.
3. 2 small supraumbilical hernias containing fat and small to
moderate-sized bilateral inguinal hernias containing fat.
4. Cholelithiasis.
5. Extensive colonic diverticulosis.

## 2021-02-12 MED ORDER — INSULIN ASPART 100 UNIT/ML IJ SOLN: 0.0000 [IU] | Freq: Three times a day (TID) | INTRAMUSCULAR | Status: DC

## 2021-02-12 MED ORDER — ACETAMINOPHEN 650 MG RE SUPP: 650.0000 mg | Freq: Four times a day (QID) | RECTAL | Status: DC | PRN

## 2021-02-12 MED ORDER — VITAMIN B-12 1000 MCG PO TABS
1000.0000 ug | ORAL_TABLET | Freq: Every day | ORAL | Status: DC
  Administered 2021-02-12 – 2021-02-14 (×2): 1000 ug via ORAL
  Filled 2021-02-12 (×2): qty 1

## 2021-02-12 MED ORDER — ALBUTEROL SULFATE HFA 108 (90 BASE) MCG/ACT IN AERS: 1.0000 | INHALATION_SPRAY | RESPIRATORY_TRACT | Status: DC | PRN

## 2021-02-12 MED ORDER — ZOLPIDEM TARTRATE 5 MG PO TABS: 5.0000 mg | ORAL_TABLET | Freq: Every evening | ORAL | Status: DC | PRN

## 2021-02-12 MED ORDER — GABAPENTIN 300 MG PO CAPS
600.0000 mg | ORAL_CAPSULE | Freq: Three times a day (TID) | ORAL | Status: DC
  Filled 2021-02-12: qty 2

## 2021-02-12 MED ORDER — IOHEXOL 9 MG/ML PO SOLN
ORAL | Status: AC
  Filled 2021-02-12: qty 1000

## 2021-02-12 MED ORDER — ONDANSETRON HCL 4 MG/2ML IJ SOLN
4.0000 mg | Freq: Four times a day (QID) | INTRAMUSCULAR | Status: DC | PRN
  Administered 2021-02-12 (×3): 4 mg via INTRAVENOUS
  Filled 2021-02-12 (×3): qty 2

## 2021-02-12 MED ORDER — PANTOPRAZOLE SODIUM 40 MG IV SOLR
40.0000 mg | INTRAVENOUS | Status: DC
  Administered 2021-02-12 – 2021-02-15 (×2): 40 mg via INTRAVENOUS
  Filled 2021-02-12 (×2): qty 40

## 2021-02-12 MED ORDER — SODIUM BICARBONATE 650 MG PO TABS
650.0000 mg | ORAL_TABLET | Freq: Three times a day (TID) | ORAL | Status: DC
  Administered 2021-02-12 – 2021-02-15 (×6): 650 mg via ORAL
  Filled 2021-02-12 (×10): qty 1

## 2021-02-12 MED ORDER — ACETAMINOPHEN 325 MG PO TABS
650.0000 mg | ORAL_TABLET | Freq: Four times a day (QID) | ORAL | Status: DC | PRN
  Administered 2021-02-12 (×2): 650 mg via ORAL
  Filled 2021-02-12 (×2): qty 2

## 2021-02-12 MED ORDER — LACTATED RINGERS IV BOLUS
1000.0000 mL | Freq: Once | INTRAVENOUS | Status: AC
  Administered 2021-02-12: 1000 mL via INTRAVENOUS

## 2021-02-12 MED ORDER — FOLIC ACID 1 MG PO TABS
1.0000 mg | ORAL_TABLET | Freq: Every day | ORAL | Status: DC
  Administered 2021-02-12 – 2021-02-14 (×2): 1 mg via ORAL
  Filled 2021-02-12 (×2): qty 1

## 2021-02-12 MED ORDER — CLONAZEPAM 0.125 MG PO TBDP
0.2500 mg | ORAL_TABLET | Freq: Two times a day (BID) | ORAL | Status: DC | PRN
  Administered 2021-02-13: 0.25 mg via ORAL
  Filled 2021-02-12: qty 2

## 2021-02-12 MED ORDER — HYDROCODONE-ACETAMINOPHEN 5-325 MG PO TABS
1.0000 | ORAL_TABLET | Freq: Two times a day (BID) | ORAL | Status: DC | PRN
Start: 2021-02-12 — End: 2021-02-12

## 2021-02-12 MED ORDER — LACTATED RINGERS IV SOLN: INTRAVENOUS | Status: DC

## 2021-02-12 MED ORDER — ENOXAPARIN SODIUM 30 MG/0.3ML IJ SOSY: 30.0000 mg | PREFILLED_SYRINGE | INTRAMUSCULAR | Status: DC

## 2021-02-12 MED ORDER — POLYETHYLENE GLYCOL 3350 17 G PO PACK: 17.0000 g | PACK | Freq: Every day | ORAL | Status: DC | PRN

## 2021-02-12 MED ORDER — ONDANSETRON HCL 4 MG PO TABS: 4.0000 mg | ORAL_TABLET | Freq: Four times a day (QID) | ORAL | Status: DC | PRN

## 2021-02-12 MED ORDER — IOHEXOL 9 MG/ML PO SOLN
500.0000 mL | ORAL | Status: AC
  Administered 2021-02-12: 500 mL via ORAL

## 2021-02-12 MED ORDER — MAGNESIUM SULFATE 2 GM/50ML IV SOLN
2.0000 g | Freq: Once | INTRAVENOUS | Status: AC
  Administered 2021-02-12: 2 g via INTRAVENOUS
  Filled 2021-02-12: qty 50

## 2021-02-12 NOTE — Plan of Care (Signed)
Plan of care initiated.

## 2021-02-12 NOTE — Progress Notes (Signed)
PROGRESS NOTE    Bruce Hunter  [id number] DOB: [birth year] DOA: 02/11/2021 PCP: Elwyn Reach, MD    Chief Complaint  Patient presents with   Hypoglycemia    Brief Narrative:   Bruce Hunter  is a 45 y.o. male, with history of asthma, diabetes mellitus type 2, GERD, hyperlipidemia, hypertension, presents the ED with a chief complaint of hypoglycemia.  Glucose in the ED at presentation was 62, patient was given snacks, started on D5.  His glucose dropped to 42, he was given an amp of D50 and D10 was added to the D5.  Glucose then improved to 77, went back down to 42 Patient was admitted after the second 42, glucagon was given and improved glucose to 117 glucose then dropped again to 83, patient was given orange juice and improved to 118  Subjective:   Start to  have fall and passing out episodes in the last few weeks to a month ,  frequent low sugar, did not want to eat for several week, has intermittent vomiting  Vomited this am 60lbs weihgt loss in the last several months due to poor appetite, n/v Denies pain No fever  Significant other at bedside  Assessment & Plan:   Active Problems:   AKI (acute kidney injury) ([place])   Hypoglycemia   HTN (hypertension)   Anemia  Hypocalcemia, with history of non-insulin-dependent type 2 diabetes for 10 years, on glipizide and metformin at home  likely due to poor oral intake from nausea vomiting, he report continue to take glipizide and metformin despite poor oral intake prior to admission Blood glucose improved after glucagon and D10 infusion, transition D10 infusion into LR, close monitor blood sugar, continue hypoglycemic protocol A1c in process Will check proinsulin, insulin and C-peptide level , also check sulfonylurea level    falls and syncope episode in the last month  EKG no acute findings with sinus rhythm ,normal QTC Likely from hypoglycemia  Nausea vomiting with 60 pounds of weight loss in the last few month -No  abdominal pain, KUB no acute findings -UDS is positive for marijuana -He also has diagnosis of noninsulin-dependent type 2 diabetes for 10 years, cannot rule out gastroparesis -Gastric emptying study ordered, informed by radiology that this test will be done to Monday(need to be narcotic free for 2 days and n.p.o. after midnight prior to procedure -[facility] consulted  Normocytic anemia No overt sign of external bleed Will check FOBT Does has iron deficiency, low folate, borderline low B12, reticulocyte count inappropriately low Start folate and B12 supplement, hold oral iron supplement until FOBT obtained Will discuss with patient regarding iv iron infusion  Thrombocytopenia Unclear etiology, no sign of hemolysis, no sign of clotting disorder or active bleeding Likely from nutrition deficit from poor oral intake Start B12 folate supplement Monitor CBC  Hyponatremia Likely combination from poor oral intake plus D10 infusion Blood glucose improved, change D10 infusion to LR infusion Start sodium bicarb supplement due to metabolic acidosis  Hypomagnesemia Replace mag IV  AKI on CKD/metabolic acidosis Creatinine 2.8 from 2021 Creatinine on presentation 4.39 Renal ultrasound no hydronephrosis UA positive for protein no infection Continue hydration, hold lisinopril and metformin Patient should avoid NSAIDs, discontinue Mobic from home med list Repeat BMP.  Renal dosing medication  Hypertension -Report not taking Norvasc, report only take lisinopril /HCTZ at home which is on hold since admission due to AKI -Blood pressure low normal likely due to dehydration, continue hydration    Body mass index is  30.18 kg/m.[name]      Unresulted Labs (From admission, onward)     Start     Ordered   02/18/21 0500  Creatinine, serum  (enoxaparin (LOVENOX)    CrCl < 30 ml/min)  Weekly,   R     Comments: while on enoxaparin therapy.    02/12/21 0041   02/13/21 0500  CBC  Tomorrow morning,    R        02/12/21 0929   02/13/21 [id number]  Basic metabolic panel  Tomorrow morning,   R        02/12/21 0929   02/13/21 0500  Magnesium  Tomorrow morning,   R        02/12/21 0929   02/13/21 0500  Uric acid  Tomorrow morning,   R        02/12/21 1456   02/13/21 0500  C-peptide  Tomorrow morning,   R        02/12/21 1529   02/13/21 0500  Sulfonylurea Hypoglycemics Panel, blood  Tomorrow morning,   R        02/12/21 1529   02/13/21 0500  Insulin, random  Tomorrow morning,   R        02/12/21 1529   02/13/21 0500  Proinsulin/insulin ratio  Tomorrow morning,   R        02/12/21 1529   02/12/21 0500  Hemoglobin A1c  Tomorrow morning,   R        02/12/21 0041   02/12/21 0322  HIV-1/2 AB - differentiation  Once,   AD        02/12/21 0322   Unscheduled  Occult blood card to lab, stool  As needed,   R      02/12/21 1459              DVT prophylaxis: SCDs Start: 02/12/21 0041   Code Status: Full Family Communication: significant other at bedside  Disposition:   Status is: Inpatient  Dispo: The patient is from: Home              Anticipated d/c is to: Home              Anticipated d/c date is: Currently not medically stable to discharge, will likely need to stay until early next week                Consultants:  [facility]  Procedures:  Gastric emptying study ordered, but will not be done until Monday  Antimicrobials:   Anti-infectives (From admission, onward)    None           Objective: Vitals:   02/12/21 0501 02/12/21 0934 02/12/21 1343 02/12/21 1434  BP: 111/65 120/60 118/74 105/60  Pulse: 89 93 (!) 101 90  Resp: 18 (!) 24 (!) 22 (!) 22  Temp: 99 F (37.2 C) 99 F (37.2 C) 99.5 F (37.5 C) 99.3 F (37.4 C)  TempSrc: Oral Oral Oral Oral  SpO2: 96% 100% 97% 95%  Weight: 92.7 kg     Height:        Intake/Output Summary (Last 24 hours) at 02/12/2021 1547 Last data filed at 02/12/2021 0600 Gross per 24 hour  Intake 1010.03 ml  Output --  Net 1010.03  ml   Filed Weights   02/11/21 2135 02/12/21 0501  Weight: 99.8 kg 92.7 kg    Examination:  General exam: Appear weak, does not look comfortable Respiratory system: Clear to auscultation. Respiratory effort normal. Cardiovascular  system: S1 & S2 heard, RRR.  Gastrointestinal system: Abdomen is nondistended, soft and nontender. Normal bowel sounds heard. Central nervous system: Alert and oriented. No focal neurological deficits. Extremities: Generalized weakness Skin: No rashes, lesions or ulcers Psychiatry: Flat affect    Data Reviewed: I have personally reviewed following labs and imaging studies  CBC: Recent Labs  Lab 02/11/21 1900 02/12/21 0322  WBC 10.0 9.4  NEUTROABS 8.7*  --   HGB 8.9* 7.8*  HCT 28.7* 24.7*  MCV 94.7 90.5  PLT 101* 92*    Basic Metabolic Panel: Recent Labs  Lab 02/11/21 2200 02/12/21 0322  NA 130* 126*  K 4.8 4.9  CL 103 100  CO2 17* 16*  GLUCOSE 56* 126*  BUN 85* 83*  CREATININE 4.39* 3.87*  CALCIUM 8.4* 8.2*  MG  --  1.4*    GFR: Estimated Creatinine Clearance: 27.4 mL/min (A) (by C-G formula based on SCr of 3.87 mg/dL (H)).  Liver Function Tests: Recent Labs  Lab 02/11/21 2200 02/12/21 0322  AST 35 32  ALT 14 15  ALKPHOS 62 61  BILITOT 0.1* 0.3  PROT 7.0 6.9  ALBUMIN 2.6* 2.6*    CBG: Recent Labs  Lab 02/11/21 2317 02/12/21 0012 02/12/21 0156 02/12/21 0256 02/12/21 0403  GLUCAP 85 117* 83 118* 109*     Recent Results (from the past 240 hour(s))  Resp Panel by RT-PCR (Flu A&B, Covid) Nasopharyngeal Swab     Status: None   Collection Time: 02/11/21 11:08 PM   Specimen: Nasopharyngeal Swab; Nasopharyngeal(NP) swabs in vial transport medium  Result Value Ref Range Status   SARS Coronavirus 2 by RT PCR NEGATIVE NEGATIVE Final    Comment: (NOTE) SARS-CoV-2 target nucleic acids are NOT DETECTED.  The SARS-CoV-2 RNA is generally detectable in upper respiratory specimens during the acute phase of infection. The  lowest concentration of SARS-CoV-2 viral copies this assay can detect is 138 copies/mL. A negative result does not preclude SARS-Cov-2 infection and should not be used as the sole basis for treatment or other patient management decisions. A negative result may occur with  improper specimen collection/handling, submission of specimen other than nasopharyngeal swab, presence of viral mutation(s) within the areas targeted by this assay, and inadequate number of viral copies(<138 copies/mL). A negative result must be combined with clinical observations, patient history, and epidemiological information. The expected result is Negative.  Fact Sheet for Patients:  EntrepreneurPulse.com.au  Fact Sheet for Healthcare Providers:  IncredibleEmployment.be  This test is no t yet approved or cleared by the [place] FDA and  has been authorized for detection and/or diagnosis of SARS-CoV-2 by FDA under an Emergency Use Authorization (EUA). This EUA will remain  in effect (meaning this test can be used) for the duration of the COVID-19 declaration under Section 564(b)(1) of the Act, 21 U.S.C.section 360bbb-3(b)(1), unless the authorization is terminated  or revoked sooner.       Influenza A by PCR NEGATIVE NEGATIVE Final   Influenza B by PCR NEGATIVE NEGATIVE Final    Comment: (NOTE) The Xpert Xpress SARS-CoV-2/FLU/RSV plus assay is intended as an aid in the diagnosis of influenza from Nasopharyngeal swab specimens and should not be used as a sole basis for treatment. Nasal washings and aspirates are unacceptable for Xpert Xpress SARS-CoV-2/FLU/RSV testing.  Fact Sheet for Patients: EntrepreneurPulse.com.au  Fact Sheet for Healthcare Providers: IncredibleEmployment.be  This test is not yet approved or cleared by the [place] FDA and has been authorized for detection and/or diagnosis of  SARS-CoV-2 by FDA under  an Emergency Use Authorization (EUA). This EUA will remain in effect (meaning this test can be used) for the duration of the COVID-19 declaration under Section 564(b)(1) of the Act, 21 U.S.C. section 360bbb-3(b)(1), unless the authorization is terminated or revoked.  Performed at [facility], [place] [address], [place], [place] [address]          Radiology Studies: DG Abd 1 View  Result Date: 02/12/2021 CLINICAL DATA:  Nausea, vomiting and abdominal pain. EXAM: ABDOMEN - 1 VIEW COMPARISON:  None. FINDINGS: The bowel gas pattern is normal. No radio-opaque calculi or other significant radiographic abnormality are seen. IMPRESSION: Negative exam. Electronically Signed   By: Inge Rise M.D.   On: 02/12/2021 11:14   US RENAL  Result Date: 02/12/2021 CLINICAL DATA:  Elevated creatinine. EXAM: RENAL / URINARY TRACT ULTRASOUND COMPLETE COMPARISON:  None. FINDINGS: Right Kidney: Renal measurements: 9.7 x 6.2 x 6.5 cm = volume: 244 mL. Echogenicity within normal limits. No mass or hydronephrosis visualized. Left Kidney: Renal measurements: 12.8 x 7.1 x 6.0 cm = volume: 285 mL. Echogenicity within normal limits. No mass or hydronephrosis visualized. Bladder: Appears normal for degree of bladder distention. Bilateral ureteral jets visualized. Other: Evaluation limited by patient respiratory motion. IMPRESSION: No hydronephrosis. Electronically Signed   By: Margaretha Sheffield MD   On: 02/12/2021 11:41        Scheduled Meds:  folic acid  1 mg Oral Daily   insulin aspart  0-6 Units Subcutaneous TID WC   sodium bicarbonate  650 mg Oral TID   vitamin B-12  1,000 mcg Oral Daily   Continuous Infusions:  lactated ringers     lactated ringers 75 mL/hr at 02/12/21 1103     LOS: 0 days   Time spent: 66mns Greater than 50% of this time was spent in counseling, explanation of diagnosis, planning of further management, and coordination of care.   Voice Recognition /Viviann Spare dictation system was used to create this note, attempts have been made to correct errors. Please contact the author with questions and/or clarifications.   FFlorencia Reasons MD PhD FACP [facility]  Available via Epic secure chat 7am-7pm for nonurgent issues Please page for urgent issues To page the attending provider between 7A-7P or the covering provider during after hours 7P-7A, please log into the web site [email or web address] and access using universal [place] password for that web site. If you do not have the password, please call the hospital operator.    02/12/2021, 3:47 PM

## 2021-02-12 NOTE — Progress Notes (Signed)
      INFECTIOUS DISEASE ATTENDING ADDENDUM:   Date: 02/12/2021  Patient name: Bruce Hunter  Medical record number: [id number]  Date of birth: [birth year]   I was alerted to his preliminary positive HIV test.  With a history of 60 pounds of weight loss I fear this gentleman could have undiagnosed untreated HIV and AIDS.  The turnaround time for the discriminatory assay and qualitative RNA testing that the part of the fourth-generation tests has been really terrible recently in fact I know of one case I just looked at where the assay never came back after a month.  I have ordered HIV quant RNA.  I would like to make sure that we see this patient prior to him leaving the hospital and start him on antiretroviral therapy and less somehow all of his labs come back and show that he does not have HIV.   Alcide Evener 02/12/2021, 4:54 PM

## 2021-02-12 NOTE — Progress Notes (Signed)
Pt vomited after drinking a small amount of the contrast, PRN Zofran given, will continue to monitor if Pt is able to tolerate intake of the contrast.

## 2021-02-12 NOTE — Consult Note (Signed)
Referring Provider: Dr. Florencia Reasons Primary Care Physician:  Elwyn Reach, MD Primary Gastroenterologist:  None  Reason for Consultation:  Hemoccult positive, anemia, nausea and vomiting.   HPI: OSA Bruce Hunter is a 45 y.o. male with history of type 2 diabetes, HTN, hyperlipidemia, GERD consulted for anemia, nausea, vomiting and 60 lbs weight loss.   Has had 2 weeks of nausea and vomiting with poor PO intake for 2 weeks resulting in lower sugars. Has had 1 week of peiumblical/epigastric pain, constant, no radiation, nothing worse. Last BM was today, denies blood in stool, melena.  Has had some GERD, took tums 3-4 x in last 2 weeks.  Has had 60 lb weight loss, patient states intentional due to diabetes diagnosis.   No NSAIDS, no blood thinner.  Denies ETOH use. Former smoker.  No family history of GI malignancy.   Past Medical History:  Diagnosis Date   Asthma    Diabetes mellitus without complication ([place])    Gout    Hyperlipidemia    Hypertension    Obesity     Past Surgical History:  Procedure Laterality Date   GANGLION CYST EXCISION Right 02/26/2020   Procedure: EXCISION TOPHUS RIGHT FOOT;  Surgeon: Evelina Bucy, DPM;  Location: [facility];  Service: Podiatry;  Laterality: Right;   NO PAST SURGERIES      Prior to Admission medications   Medication Sig Start Date End Date Taking? Authorizing Provider  ACCU-CHEK GUIDE test strip 2 (two) times daily. 09/24/20   [provider]  Accu-Chek Softclix Lancets lancets 2 (two) times daily. 09/30/20   [provider]  amLODipine (NORVASC) 10 MG tablet Take 10 mg by mouth daily.    [provider]  clonazePAM (KLONOPIN) 0.5 MG tablet Take 0.5 mg by mouth 3 (three) times daily as needed. 10/28/20   [provider]  Diclofenac-miSOPROStol 75-0.2 MG TBEC Take 1 tablet by mouth 2 (two) times daily. 09/24/20   [provider]  gabapentin (NEURONTIN) 600 MG tablet Take 600 mg by  mouth 3 (three) times daily. 03/20/20   [provider]  glipiZIDE (GLUCOTROL) 5 MG tablet Take 5 mg by mouth 2 (two) times daily. 09/29/20   [provider]  hydrochlorothiazide (HYDRODIURIL) 12.5 MG tablet Take 12.5 mg by mouth daily.    [provider]  hydrochlorothiazide (HYDRODIURIL) 25 MG tablet Take 25 mg by mouth daily. 08/31/20   [provider]  HYDROcodone-acetaminophen (NORCO/VICODIN) 5-325 MG tablet Take 1 tablet by mouth 2 (two) times daily as needed. 10/28/20   [provider]  hydrocortisone 2.5 % cream Apply topically. 11/06/20   [provider]  lisinopril (ZESTRIL) 20 MG tablet Take 20 mg by mouth daily. 10/06/20   [provider]  meloxicam (MOBIC) 15 MG tablet TAKE 1 TABLET(15 MG) BY MOUTH DAILY 09/14/20   Evelina Bucy, DPM  metFORMIN (GLUCOPHAGE) 500 MG tablet Take by mouth 2 (two) times daily with a meal.    [provider]  miconazole (MICOTIN) 2 % cream Apply topically. 06/26/20   [provider]  MITIGARE 0.6 MG CAPS Take 1 capsule by mouth daily. 11/09/20   [provider]  mometasone (ELOCON) 0.1 % lotion Apply topically. 11/09/20   [provider]  omeprazole (PRILOSEC) 20 MG capsule Take 20 mg by mouth daily. 04/09/20   [provider]  traMADol (ULTRAM) 50 MG tablet Take by mouth every 6 (six) hours as needed.    [provider]  zolpidem (AMBIEN) 5 MG tablet Take 5 mg by mouth at bedtime as needed. 07/09/20   [provider]    Scheduled Meds:  folic acid  1 mg Oral Daily   insulin aspart  0-6 Units Subcutaneous TID WC   sodium bicarbonate  650 mg Oral TID   vitamin B-12  1,000 mcg Oral Daily   Continuous Infusions:  lactated ringers     lactated ringers 75 mL/hr at 02/12/21 1103   PRN Meds:.acetaminophen **OR** acetaminophen, albuterol, clonazepam, ondansetron **OR** ondansetron (ZOFRAN) IV, polyethylene glycol, zolpidem  Allergies as of  02/11/2021   (No Known Allergies)    Family History  Problem Relation Age of Onset   Asthma Mother    Cancer Mother    Diabetes Mother    Obesity Mother    Obesity Sister    Diabetes Brother    Obesity Brother    Cancer Maternal Grandmother    Diabetes Maternal Grandmother    Diabetes Other     Social History   Socioeconomic History   Marital status: Single    Spouse name: Not on file   Number of children: Not on file   Years of education: Not on file   Highest education level: Not on file  Occupational History   Not on file  Tobacco Use   Smoking status: Every Day    Packs/day: 0.50    Pack years: 0.00    Types: Cigarettes   Smokeless tobacco: Never  Substance and Sexual Activity   Alcohol use: Yes   Drug use: Yes    Frequency: 1.0 times per week    Types: Marijuana    Comment: marijuana at midnight last night   Sexual activity: Yes  Other Topics Concern   Not on file  Social History Narrative   Not on file   Social Determinants of Health   Financial Resource Strain: Not on file  Food Insecurity: Not on file  Transportation Needs: Not on file  Physical Activity: Not on file  Stress: Not on file  Social Connections: Not on file  Intimate Partner Violence: Not on file    Review of Systems:  Review of Systems  Constitutional:  Positive for malaise/fatigue and weight loss (60 lb in last year intentional). Negative for chills and fever.  Eyes:  Negative for redness.  Respiratory:  Negative for shortness of breath.   Cardiovascular:  Negative for chest pain and leg swelling.  Gastrointestinal:  Positive for abdominal pain, nausea and vomiting. Negative for blood in stool, constipation, diarrhea, heartburn and melena.  Musculoskeletal:  Negative for falls.  Skin:  Negative for itching and rash.  Neurological:  Negative for loss of consciousness.  Psychiatric/Behavioral:  Negative for memory loss.     Physical Exam: Vital signs: Vitals:   02/12/21  1343 02/12/21 1434  BP: 118/74 105/60  Pulse: (!) 101 90  Resp: (!) 22 (!) 22  Temp: 99.5 F (37.5 C) 99.3 F (37.4 C)  SpO2: 97% 95%   Last BM Date: 02/12/21 Physical Exam Constitutional:      General: He is not in acute distress.    Appearance: Normal appearance. He is obese.  Eyes:     General: No scleral icterus.    Conjunctiva/sclera: Conjunctivae normal.  Cardiovascular:     Rate and Rhythm: Normal rate and regular rhythm.     Heart sounds: Normal heart sounds. No murmur heard. Pulmonary:     Effort: Pulmonary effort is normal.     Breath sounds:  Normal breath sounds.  Abdominal:     General: Bowel sounds are normal.     Palpations: Abdomen is soft. There is no mass.     Tenderness: There is abdominal tenderness (mild periumblicial/epigstric tenderness). There is no guarding or rebound.  Musculoskeletal:        General: No swelling. Normal range of motion.  Skin:    General: Skin is warm and dry.     Coloration: Skin is not jaundiced.  Neurological:     General: No focal deficit present.     Mental Status: He is alert and oriented to person, place, and time.  Psychiatric:        Mood and Affect: Mood normal.        Behavior: Behavior normal.     GI:  Lab Results: Recent Labs    02/11/21 1900 02/12/21 0322  WBC 10.0 9.4  HGB 8.9* 7.8*  HCT 28.7* 24.7*  PLT 101* 92*   BMET Recent Labs    02/11/21 2200 02/12/21 0322  NA 130* 126*  K 4.8 4.9  CL 103 100  CO2 17* 16*  GLUCOSE 56* 126*  BUN 85* 83*  CREATININE 4.39* 3.87*  CALCIUM 8.4* 8.2*   LFT Recent Labs    02/12/21 0322  PROT 6.9  ALBUMIN 2.6*  AST 32  ALT 15  ALKPHOS 61  BILITOT 0.3   PT/INR No results for input(s): LABPROT, INR in the last 72 hours.  Studies/Results: DG Abd 1 View  Result Date: 02/12/2021 CLINICAL DATA:  Nausea, vomiting and abdominal pain. EXAM: ABDOMEN - 1 VIEW COMPARISON:  None. FINDINGS: The bowel gas pattern is normal. No radio-opaque calculi or other  significant radiographic abnormality are seen. IMPRESSION: Negative exam. Electronically Signed   By: Inge Rise M.D.   On: 02/12/2021 11:14   US RENAL  Result Date: 02/12/2021 CLINICAL DATA:  Elevated creatinine. EXAM: RENAL / URINARY TRACT ULTRASOUND COMPLETE COMPARISON:  None. FINDINGS: Right Kidney: Renal measurements: 9.7 x 6.2 x 6.5 cm = volume: 244 mL. Echogenicity within normal limits. No mass or hydronephrosis visualized. Left Kidney: Renal measurements: 12.8 x 7.1 x 6.0 cm = volume: 285 mL. Echogenicity within normal limits. No mass or hydronephrosis visualized. Bladder: Appears normal for degree of bladder distention. Bilateral ureteral jets visualized. Other: Evaluation limited by patient respiratory motion. IMPRESSION: No hydronephrosis. Electronically Signed   By: Margaretha Sheffield MD   On: 02/12/2021 11:41    Impression and Plan Anemia, hemoccult positive CBC 8.9-->7.8, ferritin elevated, and iron panel appears to be that of chronic disease.  BUN baseline around 42 in 2001, now at 23. Cr decreasing from 4.39 to 3.87.  Will get CT AB and pelvis without contrast due to kidney function.  Serial CBC, IVF, supportive care Continue to monitor H&H with transfusion as needed to maintain hemoglobin greater than 7. Clear liquid diet now, NPO at midnight for possible endoscopy tomorrow  Nausea, vomiting Can be related to hypoglycemia, kidney function/azotemia, however will get CT AB and pelvis without contrast due to kidney function to evaluation.  Start on pantoprazole 40 mg daily IV Zofran and supportive care.   Mild epigastric/periumblical pain CT AB and pelvis without contrast due to kidney function to evaluation.  May get RUQ [place] pending CT Start on pantoprazole 40 mg daily IV   LOS: 0 days   Vladimir Crofts  PA-C 02/12/2021, 3:36 PM  Contact #  [phone]

## 2021-02-12 NOTE — H&P (Signed)
TRH H&P    Patient Demographics:    Bruce Hunter, is a 45 y.o. male  MRN: [id number]  DOB - November 18, 1975  Admit Date - 02/11/2021  Referring MD/NP/PA: Colvin Caroli  Outpatient Primary MD for the patient is Elwyn Reach, MD  Patient coming from: [place]  Chief complaint- hypoglycemia   HPI:    Bruce Hunter  is a 45 y.o. male, with history of asthma, diabetes mellitus type 2, GERD, hyperlipidemia, hypertension, presents the ED with a chief complaint of hypoglycemia.  Patient reports that he has had 2 episodes of syncope over the last couple of days.  The first was day before yesterday, the second 1 was yesterday.  He denies any dizziness, chest pain, palpitations, dyspnea prior to syncope.  He reports that he just "falls out."  He reports that his girlfriend witnessed him both times, she reports no convulsions, he is not his head.  He reports no history of seizures.  Patient is a diabetic and he is taking metformin and glipizide as prescribed.  Patient reports no extra doses of glipizide.  Patient has not had a glucometer, so he has not been able to check his glucose at home.  He reports that he has not been eating normally because of a decrease in appetite.  His last normal meal was approximately 24 hours prior to presentation to the ER.  He reports he is drinking plenty of water each day.  He has not noticed any changes in his urine habits.  He has no known history of renal insufficiency.  Patient reports no other complaints at this time.  Patient is a former smoker and quit smoking approximately 1 year ago.  He does not drink.  He smokes marijuana a couple times a week.  He is vaccinated for COVID.  Patient is full code.  In the ED Temp 98.7, heart rate 80-89, respiratory rate 19-28, blood pressure 117/70, satting at 95% No leukocytosis, hemoglobin down to 7.8 from 8.9 yesterday Chemistry panel reveals a BUN of 85,  creatinine 4.39, glucose 56 Glucose in the ED at presentation was 62, patient was given snacks, started on D5.  His glucose dropped to 42, he was given an amp of D50 and D10 was added to the D5.  Glucose then improved to 77, went back down to 42 Patient was admitted after the second 42, glucagon was given and improved glucose to 117 glucose then dropped again to 83, patient was given orange juice and improved to 118 EKG shows sinus rhythm of 83, QTc 428 UA was not indicative of UTI Admission requested for further management of hypoglycemia     Review of systems:    In addition to the HPI above,  No Fever-chills, No Headache, No changes with Vision or hearing, No problems swallowing food or Liquids, No Chest pain, Cough or Shortness of Breath, No Abdominal pain, No Nausea or Vomiting, bowel movements are regular, No Blood in stool or Urine, No dysuria, No new skin rashes or bruises, No new joints pains-aches,  No new weakness, tingling,  numbness in any extremity, No recent weight gain or loss, No polyuria, polydypsia or polyphagia, No significant Mental Stressors.  All other systems reviewed and are negative.    Past History of the following :    Past Medical History:  Diagnosis Date   Asthma    Diabetes mellitus without complication ([place])    Gout    Hyperlipidemia    Hypertension    Obesity       Past Surgical History:  Procedure Laterality Date   GANGLION CYST EXCISION Right 02/26/2020   Procedure: EXCISION TOPHUS RIGHT FOOT;  Surgeon: Evelina Bucy, DPM;  Location: [facility];  Service: Podiatry;  Laterality: Right;   NO PAST SURGERIES        Social History:      Social History   Tobacco Use   Smoking status: Every Day    Packs/day: 0.50    Pack years: 0.00    Types: Cigarettes   Smokeless tobacco: Never  Substance Use Topics   Alcohol use: Yes       Family History :     Family History  Problem Relation Age of Onset   Asthma  Mother    Cancer Mother    Diabetes Mother    Obesity Mother    Obesity Sister    Diabetes Brother    Obesity Brother    Cancer Maternal Grandmother    Diabetes Maternal Grandmother    Diabetes Other       Home Medications:   Prior to Admission medications   Medication Sig Start Date End Date Taking? Authorizing Provider  ACCU-CHEK GUIDE test strip 2 (two) times daily. 09/24/20   [provider]  Accu-Chek Softclix Lancets lancets 2 (two) times daily. 09/30/20   [provider]  amLODipine (NORVASC) 10 MG tablet Take 10 mg by mouth daily.    [provider]  clonazePAM (KLONOPIN) 0.5 MG tablet Take 0.5 mg by mouth 3 (three) times daily as needed. 10/28/20   [provider]  Diclofenac-miSOPROStol 75-0.2 MG TBEC Take 1 tablet by mouth 2 (two) times daily. 09/24/20   [provider]  gabapentin (NEURONTIN) 600 MG tablet Take 600 mg by mouth 3 (three) times daily. 03/20/20   [provider]  glipiZIDE (GLUCOTROL) 5 MG tablet Take 5 mg by mouth 2 (two) times daily. 09/29/20   [provider]  hydrochlorothiazide (HYDRODIURIL) 12.5 MG tablet Take 12.5 mg by mouth daily.    [provider]  hydrochlorothiazide (HYDRODIURIL) 25 MG tablet Take 25 mg by mouth daily. 08/31/20   [provider]  HYDROcodone-acetaminophen (NORCO/VICODIN) 5-325 MG tablet Take 1 tablet by mouth 2 (two) times daily as needed. 10/28/20   [provider]  hydrocortisone 2.5 % cream Apply topically. 11/06/20   [provider]  lisinopril (ZESTRIL) 20 MG tablet Take 20 mg by mouth daily. 10/06/20   [provider]  meloxicam (MOBIC) 15 MG tablet TAKE 1 TABLET(15 MG) BY MOUTH DAILY 09/14/20   Evelina Bucy, DPM  metFORMIN (GLUCOPHAGE) 500 MG tablet Take by mouth 2 (two) times daily with a meal.    [provider]  miconazole (MICOTIN) 2 % cream Apply topically. 06/26/20   [provider]  MITIGARE 0.6 MG  CAPS Take 1 capsule by mouth daily. 11/09/20   [provider]  mometasone (ELOCON) 0.1 % lotion Apply topically. 11/09/20   [provider]  omeprazole (PRILOSEC) 20 MG capsule Take 20 mg by mouth daily. 04/09/20  [provider]  traMADol (ULTRAM) 50 MG tablet Take by mouth every 6 (six) hours as needed.    [provider]  zolpidem (AMBIEN) 5 MG tablet Take 5 mg by mouth at bedtime as needed. 07/09/20   [provider]     Allergies:    No Known Allergies   Physical Exam:   Vitals  Blood pressure (!) 143/76, pulse 93, temperature 98.9 F (37.2 C), temperature source Oral, resp. rate 20, height '5\' 9"'$  (1.753 m), weight 99.8 kg, SpO2 98 %.  1.  General: Patient lying supine in bed,  no acute distress   2. Psychiatric: Drowsy, awakens to voice, and oriented x 3, mood and behavior normal for situation, pleasant and cooperative with exam   3. Neurologic: Speech is slurred but patient is drowsy and language is normal, face is symmetric, moves all 4 extremities voluntarily, at baseline without acute deficits on limited exam   4. HEENMT:  Head is atraumatic, normocephalic, pupils reactive to light, neck is supple, trachea is midline, mucous membranes are moist   5. Respiratory : Lungs are clear to auscultation bilaterally without wheezing, rhonchi, rales, no cyanosis, no increase in work of breathing or accessory muscle use   6. Cardiovascular : Heart rate normal, rhythm is regular, no murmurs, rubs or gallops, no peripheral edema, peripheral pulses palpated   7. Gastrointestinal:  Abdomen is soft, nondistended, nontender to palpation bowel sounds active, no masses or organomegaly palpated   8. Skin:  Skin is warm, dry and intact without rashes, acute lesions, or ulcers on limited exam   9.Musculoskeletal:  No acute deformities or trauma, no asymmetry in tone, no peripheral edema, peripheral pulses palpated, no tenderness to palpation in the  extremities     Data Review:    CBC Recent Labs  Lab 02/11/21 1900 02/12/21 0322  WBC 10.0 9.4  HGB 8.9* 7.8*  HCT 28.7* 24.7*  PLT 101* 92*  MCV 94.7 90.5  MCH 29.4 28.6  MCHC 31.0 31.6  RDW 15.3 14.6  LYMPHSABS 0.3*  --   MONOABS 0.7  --   EOSABS 0.2  --   BASOSABS 0.0  --    ------------------------------------------------------------------------------------------------------------------  Results for orders placed or performed during the hospital encounter of 02/11/21 (from the past 48 hour(s))  CBG monitoring, ED     Status: Abnormal   Collection Time: 02/11/21  6:51 PM  Result Value Ref Range   Glucose-Capillary 62 (L) 70 - 99 mg/dL    Comment: Glucose reference range applies only to samples taken after fasting for at least 8 hours.  Lactic acid, plasma     Status: None   Collection Time: 02/11/21  7:00 PM  Result Value Ref Range   Lactic Acid, Venous 1.7 0.5 - 1.9 mmol/L    Comment: Performed at [facility], [place] [address] [place], [place] [address]  CBC with Differential     Status: Abnormal   Collection Time: 02/11/21  7:00 PM  Result Value Ref Range   WBC 10.0 4.0 - 10.5 K/uL   RBC 3.03 (L) 4.22 - 5.81 MIL/uL   Hemoglobin 8.9 (L) 13.0 - 17.0 g/dL   HCT 28.7 (L) 39.0 - 52.0 %   MCV 94.7 80.0 - 100.0 fL   MCH 29.4 26.0 - 34.0 pg   MCHC 31.0 30.0 - 36.0 g/dL   RDW 15.3 11.5 - 15.5 %   Platelets 101 (L) 150 - 400 K/uL    Comment: SPECIMEN CHECKED FOR  CLOTS Immature Platelet Fraction may be clinically indicated, consider ordering this additional test [id number] REPEATED TO VERIFY PLATELET COUNT CONFIRMED BY SMEAR    nRBC 0.0 0.0 - 0.2 %   Neutrophils Relative % 87 %   Neutro Abs 8.7 (H) 1.7 - 7.7 K/uL   Lymphocytes Relative 3 %   Lymphs Abs 0.3 (L) 0.7 - 4.0 K/uL   Monocytes Relative 7 %   Monocytes Absolute 0.7 0.1 - 1.0 K/uL   Eosinophils Relative 2 %   Eosinophils Absolute 0.2 0.0 - 0.5 K/uL   Basophils Relative 0 %    Basophils Absolute 0.0 0.0 - 0.1 K/uL   Immature Granulocytes 1 %   Abs Immature Granulocytes 0.08 (H) 0.00 - 0.07 K/uL    Comment: Performed at [facility], [place] [address] [place], [place] [address]  Urinalysis, Routine w reflex microscopic     Status: Abnormal   Collection Time: 02/11/21  7:00 PM  Result Value Ref Range   Color, Urine STRAW (A) YELLOW   APPearance CLEAR CLEAR   Specific Gravity, Urine 1.006 1.005 - 1.030   pH 5.0 5.0 - 8.0   Glucose, UA NEGATIVE NEGATIVE mg/dL   Hgb urine dipstick SMALL (A) NEGATIVE   Bilirubin Urine NEGATIVE NEGATIVE   Ketones, ur NEGATIVE NEGATIVE mg/dL   Protein, ur 30 (A) NEGATIVE mg/dL   Nitrite NEGATIVE NEGATIVE   Leukocytes,Ua NEGATIVE NEGATIVE   RBC / HPF 0-5 0 - 5 RBC/hpf   WBC, UA 0-5 0 - 5 WBC/hpf   Bacteria, UA NONE SEEN NONE SEEN   Mucus PRESENT     Comment: Performed at [facility], [place] [address] [place], [place] [address]  POC CBG, ED     Status: Abnormal   Collection Time: 02/11/21  8:13 PM  Result Value Ref Range   Glucose-Capillary 55 (L) 70 - 99 mg/dL    Comment: Glucose reference range applies only to samples taken after fasting for at least 8 hours.  POC CBG, ED     Status: Abnormal   Collection Time: 02/11/21  9:16 PM  Result Value Ref Range   Glucose-Capillary 42 (LL) 70 - 99 mg/dL    Comment: Glucose reference range applies only to samples taken after fasting for at least 8 hours.   Comment 1 Notify RN    Comment 2 Document in Chart   Comprehensive metabolic panel     Status: Abnormal   Collection Time: 02/11/21 10:00 PM  Result Value Ref Range   Sodium 130 (L) 135 - 145 mmol/L   Potassium 4.8 3.5 - 5.1 mmol/L   Chloride 103 98 - 111 mmol/L   CO2 17 (L) 22 - 32 mmol/L   Glucose, Bld 56 (L) 70 - 99 mg/dL    Comment: Glucose reference range applies only to samples taken after fasting for at least 8 hours.   BUN 85 (H) 6 - 20 mg/dL   Creatinine, Ser 4.39 (H) 0.61 - 1.24  mg/dL   Calcium 8.4 (L) 8.9 - 10.3 mg/dL   Total Protein 7.0 6.5 - 8.1 g/dL   Albumin 2.6 (L) 3.5 - 5.0 g/dL   AST 35 15 - 41 U/L   ALT 14 0 - 44 U/L   Alkaline Phosphatase 62 38 - 126 U/L   Total Bilirubin 0.1 (L) 0.3 - 1.2 mg/dL   GFR, Estimated 16 (L) >60 mL/min    Comment: (NOTE) Calculated using the CKD-EPI Creatinine Equation (2021)    Anion  gap 10 5 - 15    Comment: Performed at [facility], [place] [address], [place], [place] [address]  Lipase, blood     Status: None   Collection Time: 02/11/21 10:00 PM  Result Value Ref Range   Lipase 25 11 - 51 U/L    Comment: Performed at [facility], [place] [address], [place], [place] [address]  POC CBG, ED     Status: None   Collection Time: 02/11/21 10:05 PM  Result Value Ref Range   Glucose-Capillary 77 70 - 99 mg/dL    Comment: Glucose reference range applies only to samples taken after fasting for at least 8 hours.  Resp Panel by RT-PCR (Flu A&B, Covid) Nasopharyngeal Swab     Status: None   Collection Time: 02/11/21 11:08 PM   Specimen: Nasopharyngeal Swab; Nasopharyngeal(NP) swabs in vial transport medium  Result Value Ref Range   SARS Coronavirus 2 by RT PCR NEGATIVE NEGATIVE    Comment: (NOTE) SARS-CoV-2 target nucleic acids are NOT DETECTED.  The SARS-CoV-2 RNA is generally detectable in upper respiratory specimens during the acute phase of infection. The lowest concentration of SARS-CoV-2 viral copies this assay can detect is 138 copies/mL. A negative result does not preclude SARS-Cov-2 infection and should not be used as the sole basis for treatment or other patient management decisions. A negative result may occur with  improper specimen collection/handling, submission of specimen other than nasopharyngeal swab, presence of viral mutation(s) within the areas targeted by this assay, and inadequate number of viral copies(<138 copies/mL). A negative result must be combined  with clinical observations, patient history, and epidemiological information. The expected result is Negative.  Fact Sheet for Patients:  EntrepreneurPulse.com.au  Fact Sheet for Healthcare Providers:  IncredibleEmployment.be  This test is no t yet approved or cleared by the [place] FDA and  has been authorized for detection and/or diagnosis of SARS-CoV-2 by FDA under an Emergency Use Authorization (EUA). This EUA will remain  in effect (meaning this test can be used) for the duration of the COVID-19 declaration under Section 564(b)(1) of the Act, 21 U.S.C.section 360bbb-3(b)(1), unless the authorization is terminated  or revoked sooner.       Influenza A by PCR NEGATIVE NEGATIVE   Influenza B by PCR NEGATIVE NEGATIVE    Comment: (NOTE) The Xpert Xpress SARS-CoV-2/FLU/RSV plus assay is intended as an aid in the diagnosis of influenza from Nasopharyngeal swab specimens and should not be used as a sole basis for treatment. Nasal washings and aspirates are unacceptable for Xpert Xpress SARS-CoV-2/FLU/RSV testing.  Fact Sheet for Patients: EntrepreneurPulse.com.au  Fact Sheet for Healthcare Providers: IncredibleEmployment.be  This test is not yet approved or cleared by the [place] FDA and has been authorized for detection and/or diagnosis of SARS-CoV-2 by FDA under an Emergency Use Authorization (EUA). This EUA will remain in effect (meaning this test can be used) for the duration of the COVID-19 declaration under Section 564(b)(1) of the Act, 21 U.S.C. section 360bbb-3(b)(1), unless the authorization is terminated or revoked.  Performed at [facility], [place] [address], [place], [place] [address]   POC CBG, ED     Status: Abnormal   Collection Time: 02/11/21 11:09 PM  Result Value Ref Range   Glucose-Capillary 42 (LL) 70 - 99 mg/dL    Comment: Glucose reference range  applies only to samples taken after fasting for at least 8 hours.   Comment 1 Notify RN   CBG monitoring, ED  Status: None   Collection Time: 02/11/21 11:17 PM  Result Value Ref Range   Glucose-Capillary 85 70 - 99 mg/dL    Comment: Glucose reference range applies only to samples taken after fasting for at least 8 hours.  Glucose, capillary     Status: Abnormal   Collection Time: 02/12/21 12:12 AM  Result Value Ref Range   Glucose-Capillary 117 (H) 70 - 99 mg/dL    Comment: Glucose reference range applies only to samples taken after fasting for at least 8 hours.  Glucose, capillary     Status: None   Collection Time: 02/12/21  1:56 AM  Result Value Ref Range   Glucose-Capillary 83 70 - 99 mg/dL    Comment: Glucose reference range applies only to samples taken after fasting for at least 8 hours.  Glucose, capillary     Status: Abnormal   Collection Time: 02/12/21  2:56 AM  Result Value Ref Range   Glucose-Capillary 118 (H) 70 - 99 mg/dL    Comment: Glucose reference range applies only to samples taken after fasting for at least 8 hours.  Magnesium     Status: Abnormal   Collection Time: 02/12/21  3:22 AM  Result Value Ref Range   Magnesium 1.4 (L) 1.7 - 2.4 mg/dL    Comment: Performed at [facility], [place] [address] [name], [place] [address]  Comprehensive metabolic panel     Status: Abnormal   Collection Time: 02/12/21  3:22 AM  Result Value Ref Range   Sodium 126 (L) 135 - 145 mmol/L   Potassium 4.9 3.5 - 5.1 mmol/L   Chloride 100 98 - 111 mmol/L   CO2 16 (L) 22 - 32 mmol/L   Glucose, Bld 126 (H) 70 - 99 mg/dL    Comment: Glucose reference range applies only to samples taken after fasting for at least 8 hours.   BUN 83 (H) 6 - 20 mg/dL   Creatinine, Ser 3.87 (H) 0.61 - 1.24 mg/dL   Calcium 8.2 (L) 8.9 - 10.3 mg/dL   Total Protein 6.9 6.5 - 8.1 g/dL   Albumin 2.6 (L) 3.5 - 5.0 g/dL   AST 32 15 - 41 U/L   ALT 15 0 - 44 U/L   Alkaline Phosphatase 61 38  - 126 U/L   Total Bilirubin 0.3 0.3 - 1.2 mg/dL   GFR, Estimated 19 (L) >60 mL/min    Comment: (NOTE) Calculated using the CKD-EPI Creatinine Equation (2021)    Anion gap 10 5 - 15    Comment: Performed at [facility], [place] [address], [place], [place] [address]  CBC     Status: Abnormal   Collection Time: 02/12/21  3:22 AM  Result Value Ref Range   WBC 9.4 4.0 - 10.5 K/uL   RBC 2.73 (L) 4.22 - 5.81 MIL/uL   Hemoglobin 7.8 (L) 13.0 - 17.0 g/dL   HCT 24.7 (L) 39.0 - 52.0 %   MCV 90.5 80.0 - 100.0 fL   MCH 28.6 26.0 - 34.0 pg   MCHC 31.6 30.0 - 36.0 g/dL   RDW 14.6 11.5 - 15.5 %   Platelets 92 (L) 150 - 400 K/uL    Comment: SPECIMEN CHECKED FOR CLOTS Immature Platelet Fraction may be clinically indicated, consider ordering this additional test [id number] CONSISTENT WITH PREVIOUS RESULT REPEATED TO VERIFY    nRBC 0.0 0.0 - 0.2 %    Comment: Performed at [facility], [place] [address] [place], [place] [address]  Reticulocytes     Status: Abnormal   Collection Time: 02/12/21  3:22 AM  Result Value Ref Range   Retic Ct Pct 0.8 0.4 - 3.1 %   RBC. 2.68 (L) 4.22 - 5.81 MIL/uL   Retic Count, Absolute 22.5 19.0 - 186.0 K/uL   Immature Retic Fract 8.7 2.3 - 15.9 %    Comment: Performed at [facility], [place] [address] [place], [place] [address]    Chemistries  Recent Labs  Lab 02/11/21 2200 02/12/21 0322  NA 130* 126*  K 4.8 4.9  CL 103 100  CO2 17* 16*  GLUCOSE 56* 126*  BUN 85* 83*  CREATININE 4.39* 3.87*  CALCIUM 8.4* 8.2*  MG  --  1.4*  AST 35 32  ALT 14 15  ALKPHOS 62 61  BILITOT 0.1* 0.3   ------------------------------------------------------------------------------------------------------------------  ------------------------------------------------------------------------------------------------------------------ GFR: Estimated Creatinine Clearance: 28.4 mL/min (A) (by C-G formula based on SCr of 3.87  mg/dL (H)). Liver Function Tests: Recent Labs  Lab 02/11/21 2200 02/12/21 0322  AST 35 32  ALT 14 15  ALKPHOS 62 61  BILITOT 0.1* 0.3  PROT 7.0 6.9  ALBUMIN 2.6* 2.6*   Recent Labs  Lab 02/11/21 2200  LIPASE 25   No results for input(s): AMMONIA in the last 168 hours. Coagulation Profile: No results for input(s): INR, PROTIME in the last 168 hours. Cardiac Enzymes: No results for input(s): CKTOTAL, CKMB, CKMBINDEX, TROPONINI in the last 168 hours. BNP (last 3 results) No results for input(s): PROBNP in the last 8760 hours. HbA1C: No results for input(s): HGBA1C in the last 72 hours. CBG: Recent Labs  Lab 02/11/21 2309 02/11/21 2317 02/12/21 0012 02/12/21 0156 02/12/21 0256  GLUCAP 42* 85 117* 83 118*   Lipid Profile: No results for input(s): CHOL, HDL, LDLCALC, TRIG, CHOLHDL, LDLDIRECT in the last 72 hours. Thyroid Function Tests: No results for input(s): TSH, T4TOTAL, FREET4, T3FREE, THYROIDAB in the last 72 hours. Anemia Panel: Recent Labs    02/12/21 0322  RETICCTPCT 0.8    --------------------------------------------------------------------------------------------------------------- Urine analysis:    Component Value Date/Time   COLORURINE STRAW (A) 02/11/2021 1900   APPEARANCEUR CLEAR 02/11/2021 1900   LABSPEC 1.006 02/11/2021 1900   PHURINE 5.0 02/11/2021 1900   GLUCOSEU NEGATIVE 02/11/2021 1900   HGBUR SMALL (A) 02/11/2021 1900   BILIRUBINUR NEGATIVE 02/11/2021 1900   KETONESUR NEGATIVE 02/11/2021 1900   PROTEINUR 30 (A) 02/11/2021 1900   NITRITE NEGATIVE 02/11/2021 1900   LEUKOCYTESUR NEGATIVE 02/11/2021 1900      Imaging Results:    No results found.     Assessment & Plan:    Active Problems:   AKI (acute kidney injury) ([place])   Hypoglycemia   HTN (hypertension)   Anemia   Hypoglycemia For 24 hours Dropped again, even on D10 1amp D50 given in the ED Improved after glucagon Continue D10 Hold glipizide Most likely 2/2  AKI Continue to monitor AKI Cr improved from 2.8 to 4.39 Continue fluids Avoid nephrotoxic agents when possible Trend in the AM HTN Holding HCTZ and lisinopril Patient is not taking amlodipine  BP well controlled now at 117/78 Continue to monitor Anxiety Continue klonopin Anemia  Hemoglobin down to 7.8 from 8.9 Anemia panel pending No over signs of bleeding FOBT pending    DVT Prophylaxis-   discontinuing lovenox 2/2 dropping Hgb -SCDs   AM Labs Ordered, also please review Full Orders  Family Communication: No family at bedside  Code Status:  Full  Admission status: Observation  The patient's presenting symptoms include: Hypoglycemia The initial radiographic and laboratory data are worrisome because of Hypoglycemia, anemia, AKI The chronic co-morbidities include T2DM, HTN     Time spent in minutes : 73   Rosabelle Jupin B Zierle-Ghosh DO

## 2021-02-12 NOTE — Progress Notes (Signed)
Inpatient Diabetes Program Recommendations  AACE/ADA: New Consensus Statement on Inpatient Glycemic Control (2015)  Target Ranges:  Prepandial:   less than 140 mg/dL      Peak postprandial:   less than 180 mg/dL (1-2 hours)      Critically ill patients:  140 - 180 mg/dL   Lab Results  Component Value Date   GLUCAP 109 (H) 02/12/2021    Review of Glycemic Control  Diabetes history: DM2 Outpatient Diabetes medications: Metformin 500 mg bid + Glipizide 5 mg bid Current orders for Inpatient glycemic control: Novolog 0-6 units tid  Inpatient Diabetes Program Recommendations:   Spoke with patient @ bedside and reviewed hypoglycemia protocal and patient verbalized appropriate intervention. Patient had been eating less and continuing to take diabetes medications the same. Reviewed Glipizide covers blood glucose from food and probably caused the hypoglycemia. Patient to pick up glucometer and check CBGs and review with PCP on visits.  Thank you, Nani Gasser. Lotus Santillo, RN, MSN, CDE  Diabetes Coordinator Inpatient Glycemic Control Team Team Pager [phone] (8am-5pm) 02/12/2021 2:56 PM

## 2021-02-12 NOTE — Plan of Care (Signed)

## 2021-02-13 ENCOUNTER — Inpatient Hospital Stay (HOSPITAL_COMMUNITY): Payer: Medicaid Other

## 2021-02-13 DIAGNOSIS — D500 Iron deficiency anemia secondary to blood loss (chronic): Secondary | ICD-10-CM

## 2021-02-13 DIAGNOSIS — D696 Thrombocytopenia, unspecified: Secondary | ICD-10-CM

## 2021-02-13 DIAGNOSIS — R634 Abnormal weight loss: Secondary | ICD-10-CM

## 2021-02-13 DIAGNOSIS — K274 Chronic or unspecified peptic ulcer, site unspecified, with hemorrhage: Secondary | ICD-10-CM

## 2021-02-13 DIAGNOSIS — R630 Anorexia: Secondary | ICD-10-CM

## 2021-02-13 DIAGNOSIS — D72810 Lymphocytopenia: Secondary | ICD-10-CM

## 2021-02-13 LAB — CBC
HCT: 28 % — ABNORMAL LOW (ref 39.0–52.0)
Hemoglobin: 9 g/dL — ABNORMAL LOW (ref 13.0–17.0)
MCH: 29.1 pg (ref 26.0–34.0)
MCHC: 32.1 g/dL (ref 30.0–36.0)
MCV: 90.6 fL (ref 80.0–100.0)
Platelets: 111 10*3/uL — ABNORMAL LOW (ref 150–400)
RBC: 3.09 MIL/uL — ABNORMAL LOW (ref 4.22–5.81)
RDW: 14.6 % (ref 11.5–15.5)
WBC: 4.9 10*3/uL (ref 4.0–10.5)
nRBC: 0 % (ref 0.0–0.2)

## 2021-02-13 LAB — BASIC METABOLIC PANEL
Anion gap: 10 (ref 5–15)
BUN: 61 mg/dL — ABNORMAL HIGH (ref 6–20)
CO2: 18 mmol/L — ABNORMAL LOW (ref 22–32)
Calcium: 9.1 mg/dL (ref 8.9–10.3)
Chloride: 107 mmol/L (ref 98–111)
Creatinine, Ser: 2.79 mg/dL — ABNORMAL HIGH (ref 0.61–1.24)
GFR, Estimated: 28 mL/min — ABNORMAL LOW (ref >60)
Glucose, Bld: 121 mg/dL — ABNORMAL HIGH (ref 70–99)
Potassium: 5.3 mmol/L — ABNORMAL HIGH (ref 3.5–5.1)
Sodium: 135 mmol/L (ref 135–145)

## 2021-02-13 LAB — GLUCOSE, CAPILLARY
Glucose-Capillary: 102 mg/dL — ABNORMAL HIGH (ref 70–99)
Glucose-Capillary: 130 mg/dL — ABNORMAL HIGH (ref 70–99)
Glucose-Capillary: 127 mg/dL — ABNORMAL HIGH (ref 70–99)
Glucose-Capillary: 139 mg/dL — ABNORMAL HIGH (ref 70–99)

## 2021-02-13 LAB — RAPID URINE DRUG SCREEN, HOSP PERFORMED
Amphetamines: NOT DETECTED
Barbiturates: NOT DETECTED
Benzodiazepines: NOT DETECTED
Cocaine: NOT DETECTED
Opiates: NOT DETECTED
Tetrahydrocannabinol: POSITIVE — AB

## 2021-02-13 LAB — HEMOGLOBIN A1C
Hgb A1c MFr Bld: 5.9 % — ABNORMAL HIGH (ref 4.8–5.6)
Mean Plasma Glucose: 123 mg/dL

## 2021-02-13 LAB — URIC ACID: Uric Acid, Serum: 11.8 mg/dL — ABNORMAL HIGH (ref 3.7–8.6)

## 2021-02-13 LAB — MAGNESIUM: Magnesium: 2 mg/dL (ref 1.7–2.4)

## 2021-02-13 LAB — HIV-1 RNA QUANT-NO REFLEX-BLD
HIV 1 RNA Quant: 2700000 copies/mL
LOG10 HIV-1 RNA: 6.431 log10copy/mL

## 2021-02-13 IMAGING — CR DG CHEST 2V
2 series · 2 of 2 positions shown · non-contrast
Comparison: [DATE] and [DATE]

CLINICAL DATA: Cough.

EXAM:
CHEST - 2 VIEW

[w chest pa]
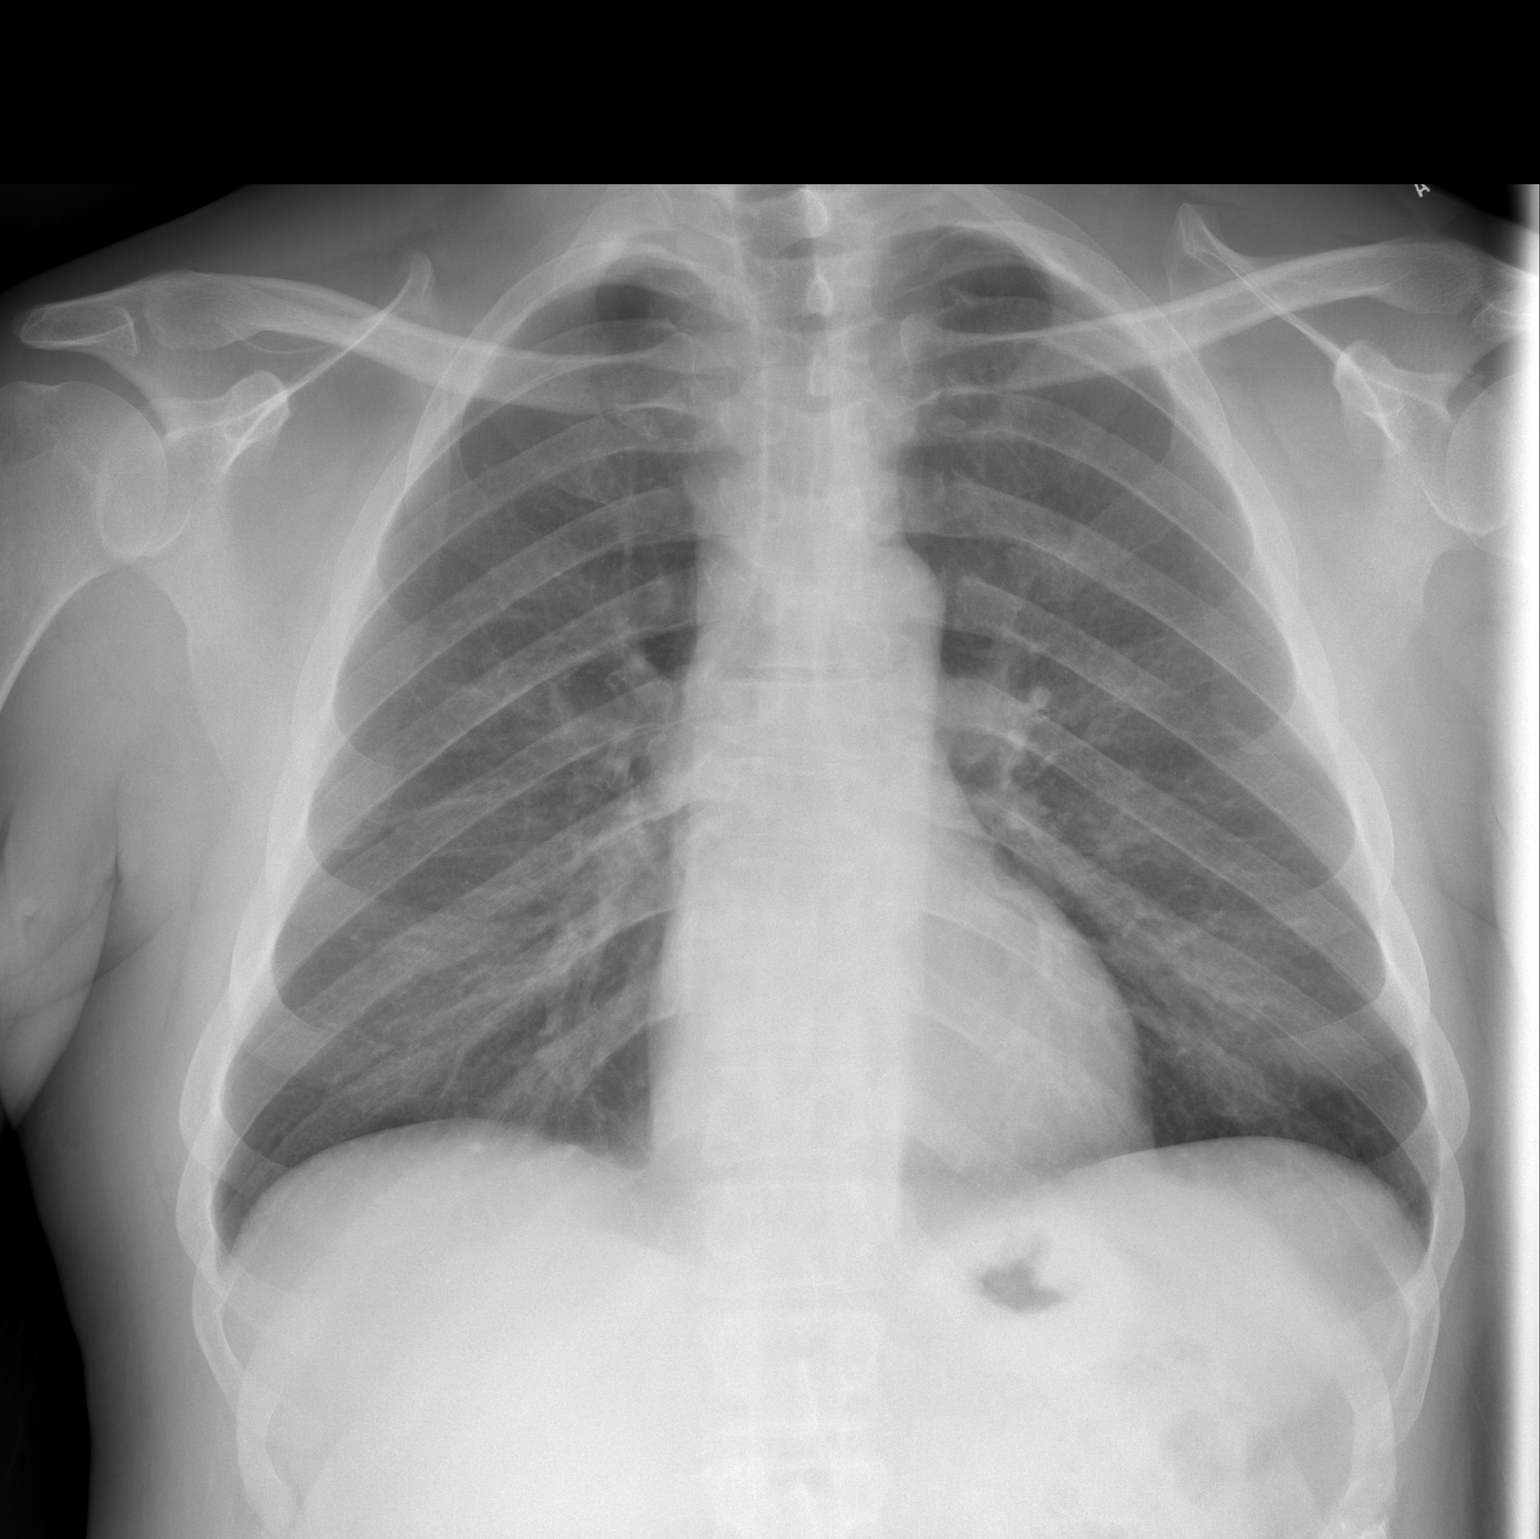

[w chest lat]
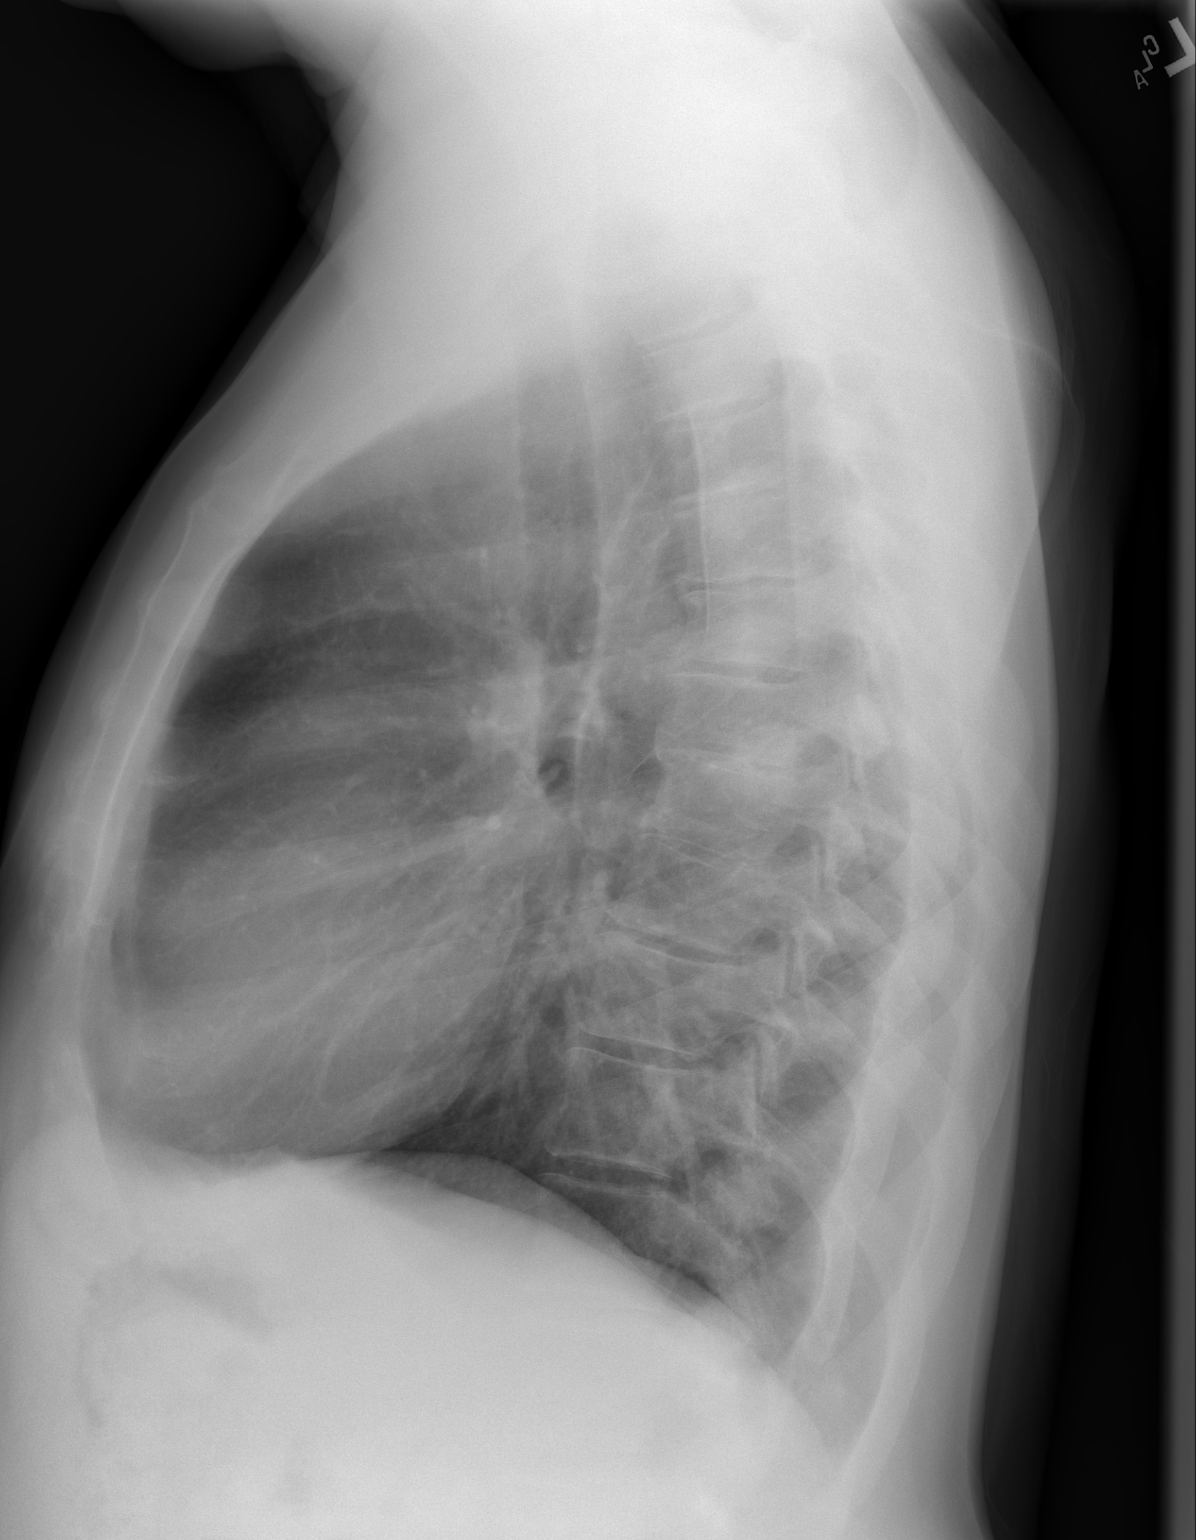

[2 of 2 positions shown; findings below may reference images not displayed]

FINDINGS: The heart, hila, mediastinum are unremarkable. No pneumothorax. No
nodules or masses. No focal infiltrates.
IMPRESSION: No active cardiopulmonary disease.

## 2021-02-13 IMAGING — DX DG CHEST 1V PORT
1 series · 1 of 1 positions shown · non-contrast
Comparison: [DATE]

CLINICAL DATA: Cough

EXAM:
PORTABLE CHEST 1 VIEW

[chest ap]
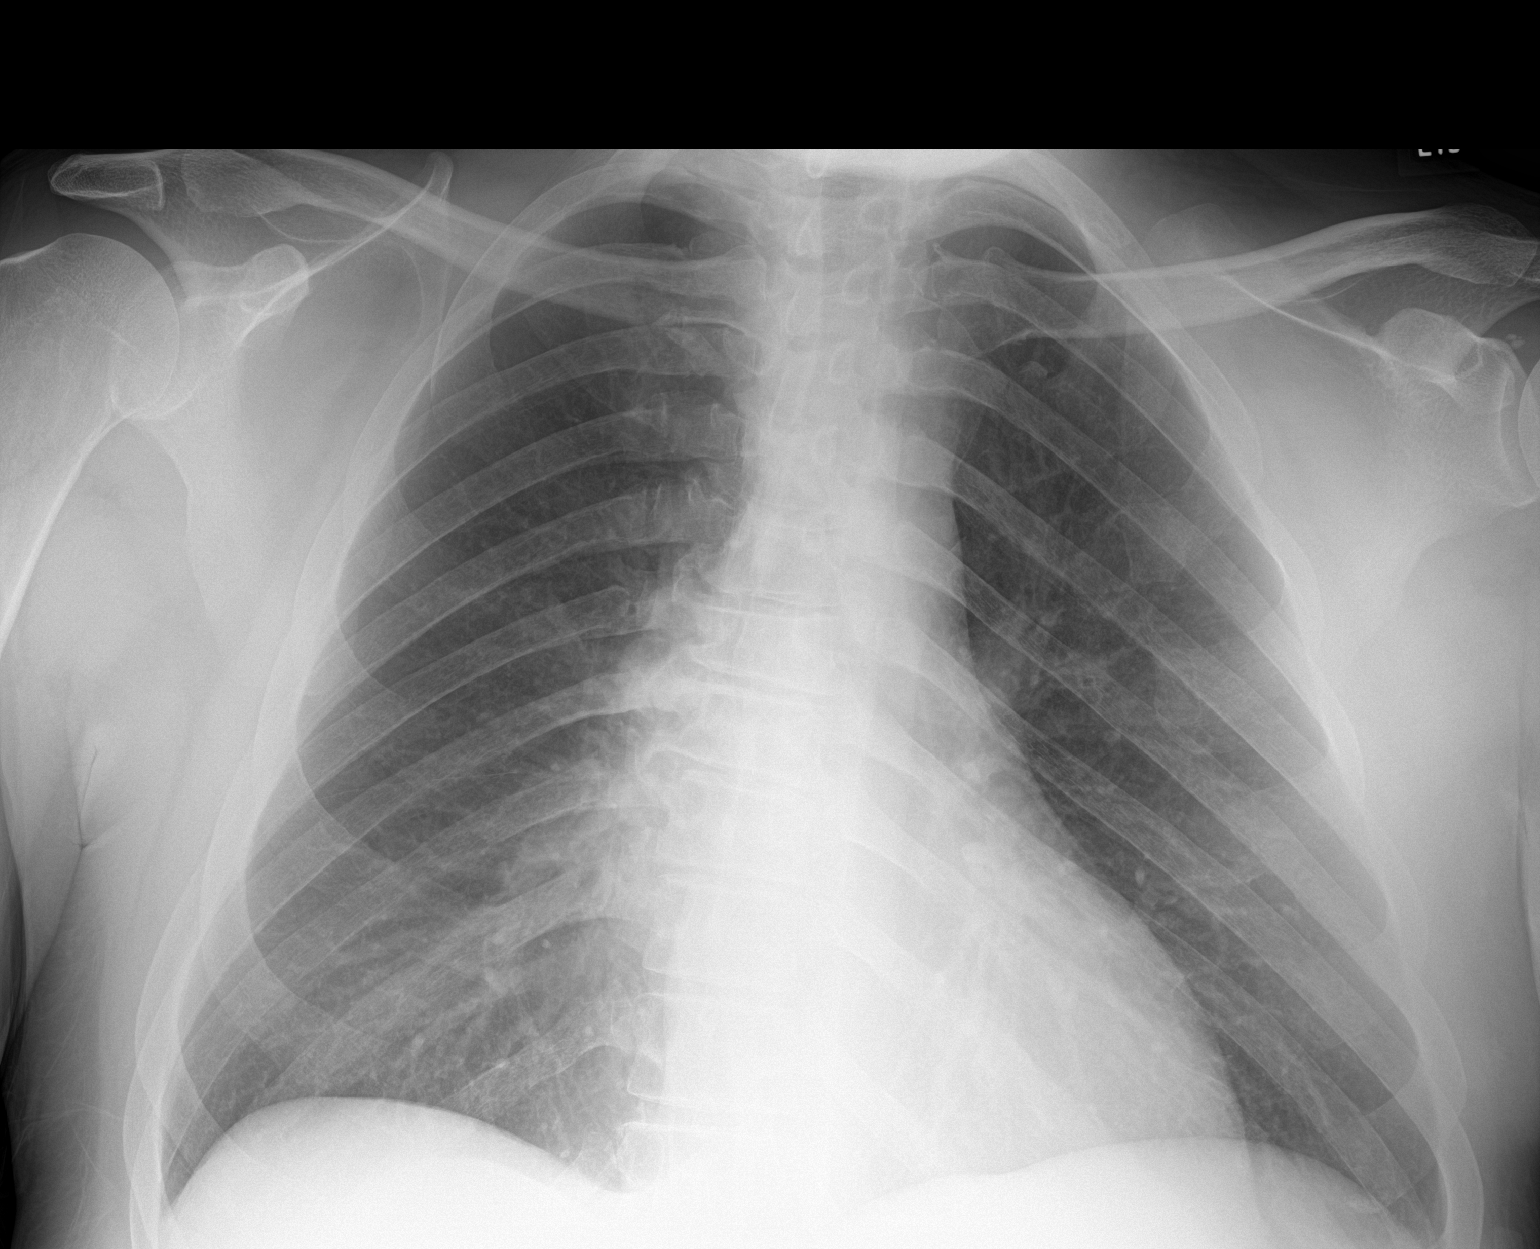

[1 of 1 positions shown; findings below may reference images not displayed]

FINDINGS: Evaluation is limited secondary to patient rotation. Apparent
thickening of the RIGHT paratracheal stripe, possibly positional in
etiology. Increased nodular appearance of the superior RIGHT hilar
border. No pleural effusion or pneumothorax. No acute
pleuroparenchymal abnormality. No acute osseous abnormality.
IMPRESSION: Apparent changes of the RIGHT paratracheal stripe and hilar border
may be positional/technical in etiology. Recommend further
evaluation with dedicated PA and lateral chest radiograph versus
chest CT.

## 2021-02-13 MED ORDER — PEG 3350-KCL-NA BICARB-NACL 420 G PO SOLR
4000.0000 mL | Freq: Once | ORAL | Status: AC
  Administered 2021-02-14: 4000 mL via ORAL

## 2021-02-13 MED ORDER — SODIUM CHLORIDE 0.9 % IV SOLN: INTRAVENOUS | Status: DC

## 2021-02-13 MED ORDER — DOLUTEGRAVIR SODIUM 50 MG PO TABS
50.0000 mg | ORAL_TABLET | Freq: Every day | ORAL | Status: DC
  Administered 2021-02-13 – 2021-02-15 (×3): 50 mg via ORAL
  Filled 2021-02-13 (×3): qty 1

## 2021-02-13 MED ORDER — SODIUM CHLORIDE 0.9 % IV SOLN: INTRAVENOUS | Status: AC

## 2021-02-13 MED ORDER — LINACLOTIDE 145 MCG PO CAPS
290.0000 ug | ORAL_CAPSULE | Freq: Every day | ORAL | Status: AC
  Administered 2021-02-14: 290 ug via ORAL
  Filled 2021-02-13: qty 2

## 2021-02-13 MED ORDER — LAMIVUDINE 150 MG PO TABS
300.0000 mg | ORAL_TABLET | Freq: Every day | ORAL | Status: DC
  Administered 2021-02-13 – 2021-02-15 (×3): 300 mg via ORAL
  Filled 2021-02-13 (×3): qty 2

## 2021-02-13 NOTE — Progress Notes (Signed)
PROGRESS NOTE    Bruce Hunter  BFX:[id number] DOB: 26-May-1976 DOA: 02/11/2021 PCP: Rometta Emery, MD    Chief Complaint  Patient presents with   Hypoglycemia    Brief Narrative:   Bruce Hunter  is a 45 y.o. male, with history of asthma, diabetes mellitus type 2, GERD, hyperlipidemia, hypertension, presents the ED with a chief complaint of hypoglycemia.  Glucose in the ED at presentation was 62, patient was given snacks, started on D5.  His glucose dropped to 42, he was given an amp of D50 and D10 was added to the D5.  Glucose then improved to 77, went back down to 42 Patient was admitted after the second 42, glucagon was given and improved glucose to 117 glucose then dropped again to 83, patient was given orange juice and improved to 118  Subjective:  GI ordered CT ab, patient was not able to tolerate oral contrast, ct was done without oral contrast He refused lab this am. He does not feel ready to lab draw now, he wants to wait a while  He starts to cough, nonproductive, no chest pain, denies sob  He appears stronger and more interactive today Denies pain No fever    Assessment & Plan:   Active Problems:   AKI (acute kidney injury) (HCC)   Hypoglycemia   HTN (hypertension)   Anemia  Hypocalcemia, with history of non-insulin-dependent type 2 diabetes for 10 years, on glipizide and metformin at home  likely due to poor oral intake from nausea vomiting, he report continue to take glipizide and metformin despite poor oral intake prior to admission Blood glucose improved , off d10 infusion  close monitor blood sugar, continue hypoglycemic protocol A1c 5.9 ( may not be accurate in the setting of anemia)  proinsulin, insulin and C-peptide level , sulfonylurea level in process    falls and syncope episode in the last month  EKG no acute findings with sinus rhythm ,normal QTC Likely from hypoglycemia  Nausea vomiting with 60 pounds of weight loss in the last few  month -No abdominal pain, KUB no acute findings -UDS is positive for marijuana -He also has diagnosis of noninsulin-dependent type 2 diabetes for 10 years, cannot rule out gastroparesis -Gastric emptying study ordered, informed by radiology that this test will be done to Monday(need to be narcotic free for 2 days and n.p.o. after midnight prior to procedure -[facility] consulted, plan for EGD  Normocytic anemia No overt sign of external bleed FOBT+ Does has iron deficiency, low folate, borderline low B12, reticulocyte count inappropriately low Start folate and B12 supplement, start oral iron supplement  Will discuss with patient regarding iv iron infusion Gi plan to do EGD  Thrombocytopenia Unclear etiology, no sign of hemolysis, no sign of clotting disorder or active bleeding Likely from nutrition deficit from poor oral intake Start B12 folate supplement Monitor CBC  Hyponatremia Likely combination from poor oral intake plus D10 infusion, off d10 Start sodium bicarb supplement due to metabolic acidosis normlaized  Hypomagnesemia Replace mag IV, improved   AKI on CKD/metabolic acidosis Creatinine 2.8 from 2021 Creatinine on presentation 4.39 Renal ultrasound no hydronephrosis UA positive for protein no infection Continue hydration, hold lisinopril and metformin Patient should avoid NSAIDs, discontinue Mobic from home med list Repeat BMP.  Renal dosing medication  Hypertension -Report not taking Norvasc, report only take lisinopril /HCTZ at home which is on hold since admission due to AKI -Blood pressure low normal likely due to dehydration, continue hydration  HIV New diagnosis ID input appreciated     Body mass index is 30.18 kg/m.[name]      Unresulted Labs (From admission, onward)     Start     Ordered   02/18/21 0500  Creatinine, serum  (enoxaparin (LOVENOX)    CrCl < 30 ml/min)  Weekly,   R     Comments: while on enoxaparin therapy.    02/12/21 0041    02/13/21 0500  CBC  Tomorrow morning,   R        02/12/21 0929   02/13/21 3244  Basic metabolic panel  Tomorrow morning,   R        02/12/21 0929   02/13/21 0500  Magnesium  Tomorrow morning,   R        02/12/21 0929   02/13/21 0500  Uric acid  Tomorrow morning,   R        02/12/21 1456   02/13/21 0500  C-peptide  Tomorrow morning,   R        02/12/21 1529   02/13/21 0500  Sulfonylurea Hypoglycemics Panel, blood  Tomorrow morning,   R        02/12/21 1529   02/13/21 0500  Insulin, random  Tomorrow morning,   R        02/12/21 1529   02/13/21 0500  Proinsulin/insulin ratio  Tomorrow morning,   R        02/12/21 1529   02/13/21 0500  HLA B*5701  Tomorrow morning,   R        02/12/21 1701   02/12/21 1702  QuantiFERON-TB Gold Plus  Once,   R        02/12/21 1701   02/12/21 1701  Cd4/cd8 (t-helper/t-suppressor cell)  Once,   R        02/12/21 1701   02/12/21 1649  HIV-1 RNA quant-no reflex-bld  Once,   R        02/12/21 1648   02/12/21 0322  HIV-1/2 AB - differentiation  Once,   AD        02/12/21 0322   Unscheduled  Occult blood card to lab, stool  As needed,   R      02/12/21 1459              DVT prophylaxis: SCDs Start: 02/12/21 0041   Code Status: Full Family Communication: significant other at bedside on 6/10 Disposition:   Status is: Inpatient  Dispo: The patient is from: Home              Anticipated d/c is to: Home              Anticipated d/c date is: Currently not medically stable to discharge, will likely need to stay until early next week                Consultants:  [facility] ID  Procedures:  Gastric emptying study ordered, but will not be done until Monday EGD planned   Antimicrobials:   Anti-infectives (From admission, onward)    None           Objective: Vitals:   02/12/21 1343 02/12/21 1434 02/12/21 2147 02/13/21 0517  BP: 118/74 105/60 (!) 142/107 133/83  Pulse: (!) 101 90 (!) 103 (!) 102  Resp: (!) 22 (!) _0 Temp: 99.5  F (37.5 C) 99.3 F (37.4 C) 99.1 F (37.3 C) 99.7 F (37.6 C)  TempSrc: Oral Oral Oral Oral  SpO2: 97% 95% 97% 98%  Weight:      Height:        Intake/Output Summary (Last 24 hours) at 02/13/2021 0805 Last data filed at 02/13/2021 0200 Gross per 24 hour  Intake 1979.84 ml  Output --  Net 1979.84 ml   Filed Weights   02/11/21 2135 02/12/21 0501  Weight: 99.8 kg 92.7 kg    Examination:  General exam: Appear stronger, NAD Respiratory system: Clear to auscultation. Respiratory effort normal. Cardiovascular system: S1 & S2 heard, RRR.  Gastrointestinal system: Abdomen is nondistended, soft and nontender. Normal bowel sounds heard. Central nervous system: Alert and oriented. No focal neurological deficits. Extremities: Generalized weakness Skin: No rashes, lesions or ulcers Psychiatry: Flat affect    Data Reviewed: I have personally reviewed following labs and imaging studies  CBC: Recent Labs  Lab 02/11/21 1900 02/12/21 0322  WBC 10.0 9.4  NEUTROABS 8.7*  --   HGB 8.9* 7.8*  HCT 28.7* 24.7*  MCV 94.7 90.5  PLT 101* 92*    Basic Metabolic Panel: Recent Labs  Lab 02/11/21 2200 02/12/21 0322  NA 130* 126*  K 4.8 4.9  CL 103 100  CO2 17* 16*  GLUCOSE 56* 126*  BUN 85* 83*  CREATININE 4.39* 3.87*  CALCIUM 8.4* 8.2*  MG  --  1.4*    GFR: Estimated Creatinine Clearance: 27.4 mL/min (A) (by C-G formula based on SCr of 3.87 mg/dL (H)).  Liver Function Tests: Recent Labs  Lab 02/11/21 2200 02/12/21 0322  AST 35 32  ALT 14 15  ALKPHOS 62 61  BILITOT 0.1* 0.3  PROT 7.0 6.9  ALBUMIN 2.6* 2.6*    CBG: Recent Labs  Lab 02/12/21 0256 02/12/21 0403 02/12/21 1709 02/12/21 2149 02/13/21 0723  GLUCAP 118* 109* 138* 117* 102*     Recent Results (from the past 240 hour(s))  Resp Panel by RT-PCR (Flu A&B, Covid) Nasopharyngeal Swab     Status: None   Collection Time: 02/11/21 11:08 PM   Specimen: Nasopharyngeal Swab; Nasopharyngeal(NP) swabs in  vial transport medium  Result Value Ref Range Status   SARS Coronavirus 2 by RT PCR NEGATIVE NEGATIVE Final    Comment: (NOTE) SARS-CoV-2 target nucleic acids are NOT DETECTED.  The SARS-CoV-2 RNA is generally detectable in upper respiratory specimens during the acute phase of infection. The lowest concentration of SARS-CoV-2 viral copies this assay can detect is 138 copies/mL. A negative result does not preclude SARS-Cov-2 infection and should not be used as the sole basis for treatment or other patient management decisions. A negative result may occur with  improper specimen collection/handling, submission of specimen other than nasopharyngeal swab, presence of viral mutation(s) within the areas targeted by this assay, and inadequate number of viral copies(<138 copies/mL). A negative result must be combined with clinical observations, patient history, and epidemiological information. The expected result is Negative.  Fact Sheet for Patients:  EntrepreneurPulse.com.au  Fact Sheet for Healthcare Providers:  IncredibleEmployment.be  This test is no t yet approved or cleared by the [place] FDA and  has been authorized for detection and/or diagnosis of SARS-CoV-2 by FDA under an Emergency Use Authorization (EUA). This EUA will remain  in effect (meaning this test can be used) for the duration of the COVID-19 declaration under Section 564(b)(1) of the Act, 21 U.S.C.section 360bbb-3(b)(1), unless the authorization is terminated  or revoked sooner.       Influenza A by PCR NEGATIVE NEGATIVE Final   Influenza B by PCR NEGATIVE NEGATIVE  Final    Comment: (NOTE) The Xpert Xpress SARS-CoV-2/FLU/RSV plus assay is intended as an aid in the diagnosis of influenza from Nasopharyngeal swab specimens and should not be used as a sole basis for treatment. Nasal washings and aspirates are unacceptable for Xpert Xpress SARS-CoV-2/FLU/RSV testing.  Fact  Sheet for Patients: EntrepreneurPulse.com.au  Fact Sheet for Healthcare Providers: IncredibleEmployment.be  This test is not yet approved or cleared by the [place] FDA and has been authorized for detection and/or diagnosis of SARS-CoV-2 by FDA under an Emergency Use Authorization (EUA). This EUA will remain in effect (meaning this test can be used) for the duration of the COVID-19 declaration under Section 564(b)(1) of the Act, 21 U.S.C. section 360bbb-3(b)(1), unless the authorization is terminated or revoked.  Performed at [facility], [place] [address] [place], [place] [address]          Radiology Studies: CT ABDOMEN PELVIS WO CONTRAST  Result Date: 02/12/2021 CLINICAL DATA:  Hypoglycemia, nausea and vomiting. EXAM: CT ABDOMEN AND PELVIS WITHOUT CONTRAST TECHNIQUE: Multidetector CT imaging of the abdomen and pelvis was performed following the standard protocol without IV contrast. COMPARISON:  None. FINDINGS: Lower chest: Minimal patchy opacity in the right lower lobe. Normal sized heart. Hepatobiliary: Small number of tiny gallstones in the gallbladder. Poorly distended gallbladder with no unexpected wall thickening or pericholecystic fluid. Unremarkable liver. Pancreas: Unremarkable. No pancreatic ductal dilatation or surrounding inflammatory changes. Spleen: Normal in size without focal abnormality. Adrenals/Urinary Tract: Adrenal glands are unremarkable. Kidneys are normal, without renal calculi, focal lesion, or hydronephrosis. Bladder is unremarkable. Stomach/Bowel: Extensive colonic diverticulosis without evidence of diverticulitis. Normal appearing appendix. Small hiatal hernia. Unremarkable small bowel. Vascular/Lymphatic: Mild atheromatous arterial calcifications. No enlarged lymph nodes. Reproductive: Prostate is unremarkable. Other: 2 small supraumbilical hernias containing fat. Small to moderate-sized bilateral  inguinal hernias containing fat, left larger than right. Musculoskeletal: Lumbosacral spine degenerative changes and mild lower thoracic spine degenerative changes. IMPRESSION: 1. No acute abnormality. 2. Small hiatal hernia. 3. 2 small supraumbilical hernias containing fat and small to moderate-sized bilateral inguinal hernias containing fat. 4. Cholelithiasis. 5. Extensive colonic diverticulosis. Electronically Signed   By: Claudie Revering M.D.   On: 02/12/2021 22:00   DG Abd 1 View  Result Date: 02/12/2021 CLINICAL DATA:  Nausea, vomiting and abdominal pain. EXAM: ABDOMEN - 1 VIEW COMPARISON:  None. FINDINGS: The bowel gas pattern is normal. No radio-opaque calculi or other significant radiographic abnormality are seen. IMPRESSION: Negative exam. Electronically Signed   By: Inge Rise M.D.   On: 02/12/2021 11:14   US RENAL  Result Date: 02/12/2021 CLINICAL DATA:  Elevated creatinine. EXAM: RENAL / URINARY TRACT ULTRASOUND COMPLETE COMPARISON:  None. FINDINGS: Right Kidney: Renal measurements: 9.7 x 6.2 x 6.5 cm = volume: 244 mL. Echogenicity within normal limits. No mass or hydronephrosis visualized. Left Kidney: Renal measurements: 12.8 x 7.1 x 6.0 cm = volume: 285 mL. Echogenicity within normal limits. No mass or hydronephrosis visualized. Bladder: Appears normal for degree of bladder distention. Bilateral ureteral jets visualized. Other: Evaluation limited by patient respiratory motion. IMPRESSION: No hydronephrosis. Electronically Signed   By: Margaretha Sheffield MD   On: 02/12/2021 11:41        Scheduled Meds:  folic acid  1 mg Oral Daily   insulin aspart  0-6 Units Subcutaneous TID WC   pantoprazole (PROTONIX) IV  40 mg Intravenous Q24H   sodium bicarbonate  650 mg Oral TID   vitamin B-12  1,000 mcg Oral Daily  Continuous Infusions:  lactated ringers 75 mL/hr at 02/12/21 1835     LOS: 1 day   Time spent: 9mns Greater than 50% of this time was spent in counseling, explanation  of diagnosis, planning of further management, and coordination of care.   Voice Recognition /Viviann Sparedictation system was used to create this note, attempts have been made to correct errors. Please contact the author with questions and/or clarifications.   FFlorencia Reasons MD PhD FACP [facility]  Available via Epic secure chat 7am-7pm for nonurgent issues Please page for urgent issues To page the attending provider between 7A-7P or the covering provider during after hours 7P-7A, please log into the web site [email or web address] and access using universal [place] password for that web site. If you do not have the password, please call the hospital operator.    02/13/2021, 8:05 AM

## 2021-02-13 NOTE — Progress Notes (Signed)
Patient is no longer vomiting.  Has kept down a small amount of food.  His iron saturation is low, his ferritin is high, and his presenting anemia was normocytic.  The overall picture seems most compatible with anemia of chronic disease.  Hemoglobin today is improved (9.0), as are platelets (currently 111,000).  Case discussed with Dr. Tommy Medal.  The patient does not have symptoms to suggest opportunistic infection of the esophagus.  However, with Hemoccult positive stool, history of nausea and vomiting, and significant weight loss, I think endoscopic evaluation is appropriate, as is also colonoscopy for screening purposes since the patient is almost age 52.  The nature, purpose, risks, and alternatives of these exams were discussed with the patient as well as his significant other, who was at the bedside.  They are agreeable to proceed.    We will aim for a prep tomorrow and his procedures will hopefully be performed Monday afternoon.    We discussed the option of just doing endoscopy as an inpatient but taking into account all factors, I think it is more expedient and better for the patient to get his entire GI tract work-up done on this admission.  Cleotis Nipper, M.D. Pager [phone] If no answer or after 5 PM call [phone]

## 2021-02-13 NOTE — Progress Notes (Signed)
Patient refusing lab work this morning. Lab going to try again this afternoon.

## 2021-02-13 NOTE — Consult Note (Addendum)
Date of Admission:  02/11/2021          Reason for Consult: HIV + test    Referring Provider [facility] auto consult and Dr. 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 Aspiration) Hemocccult + stool  Anemia and thrombocytopenia Concern he has esophageal pathology such as candidal infection, HSV or CMV infection Diabetes mellitus Gout Obesity Ganglion cyst Obesity HTN  Plan:  HIV RNA sent, will send genotype, Cd4 which will be low, routine labs Start components of Dovato (not on formulary as Single tablet yet) EGD by Dr. Cristina Gong and would send suspicious areas on esophagus for HSV, CMV by PCR He will undoubtedly need PCP prophylaxis (will send G6PD and likely start dapsone tomorrow I will also send AFB blood cultures to look for disseminated M avium (seems not very likely)    Active Problems:   AKI (acute kidney injury) ([place])   Hypoglycemia   HTN (hypertension)   Anemia   Scheduled Meds:  dolutegravir  50 mg Oral Daily   folic acid  1 mg Oral Daily   insulin aspart  0-6 Units Subcutaneous TID WC   lamiVUDine  300 mg Oral Daily   pantoprazole (PROTONIX) IV  40 mg Intravenous Q24H   sodium bicarbonate  650 mg Oral TID   vitamin B-12  1,000 mcg Oral Daily   Continuous Infusions:  lactated ringers 75 mL/hr at 02/12/21 1835   PRN Meds:.acetaminophen **OR** acetaminophen, albuterol, clonazepam, ondansetron **OR** ondansetron (ZOFRAN) IV, polyethylene glycol, zolpidem  HPI: Bruce Hunter is a 45 y.o. male with past medical history significant for type 2 diabetes obesity hypertension gout who presented to the ER with with low blood sugars he had had 2 episodes of syncope in the last few days and context quite low sugars.  He had been experiencing recent nausea with vomiting and very poor appetite.  He has had a 60  pound weight loss over the last year.  He states that he intended to lose weight and was trying to do so by eating fruits and vegetables.  When he arrived in the ER his labs were significant for acute renal failure with a creatinine of 4.39, blood glucose of 56 bicarbonate of 17.  CBC showed normocytic normochromic anemia with thrombocytopenia and absolute lymphocyte count of 300.  His fourth-generation HIV assay is preliminary positive.  I have ordered HIV quantitative RNA yesterday given the abysmal turnaround time in discriminatory assay with fourth-generation in 2022.  Think it is highly likely that his HIV test represents a true positive given his absolute lymphocyte count of 300 and his constellation of findings.  In terms of risk of acquisition its sounds as if sexual transmission is the only way that he would have acquired this infection   When I talked him this morning he indicated not had sex for a year at least. Certainly if this is the case that would fit with him likely having advanced disease and low CD4 count.  I reiterated to him that if he in fact has HIV and has a low CD4 count (AIDS) that we can quickly gain control of his virus with modern antiretroviral therapy which is easily tolerated and potent and that this will allow him to restore his immune system and become healthy again and lead a life with a normal life  expectancy and normal quality of life.  I also explained to him that once the virus comes under control he would not build to transmit this to others through sexual transmission.  I have given him my card I am coming back to see him again tomorrow I am going to start him on the components of Dovato today.  Note I do not renally adjust lamivudine.  I found that I can give this a full dose to people even end-stage renal disease without any adverse side effects.  This is been the experience that I have had in many other experienced probationers. I am avoiding TAF given his ARF.(I  wish Dovato would be on formulary soon so he could have satisfaction of being on a single tablet regimen in the hospital.  With regards to his kidney function he did have elevated creatinine in 2021 was checked in her system he is followed by Dr. Mcarthur Rossetti as an outpatient.  CT scan of the abdomen pelvis which showed only diverticulosis he was not able to take the oral contrast well as he vomited it up and may have aspirated as he has been coughing quite a bit this morning when I saw him.  I discussed case with Dr. Cristina Gong and it sounds as if the patient can likely get an EGD tomorrow.    I spent more than 80 minutes with the patient including greater than 50% of time in face to face counseling of the patient personally reviewing radiographs, along with pertinent laboratory microbiological data review of medical records and in coordination of his care with Dr Cristina Gong and Dr. Annamaria Boots.      Review of Systems: Review of Systems  Constitutional:  Positive for malaise/fatigue and weight loss. Negative for chills, diaphoresis and fever.  HENT:  Negative for congestion, hearing loss, sore throat and tinnitus.   Eyes:  Negative for blurred vision and double vision.  Respiratory:  Positive for cough. Negative for sputum production, shortness of breath and wheezing.   Cardiovascular:  Negative for chest pain, palpitations and leg swelling.  Gastrointestinal:  Positive for blood in stool, nausea and vomiting. Negative for abdominal pain, constipation, diarrhea, heartburn and melena.  Genitourinary:  Negative for dysuria, flank pain and hematuria.  Musculoskeletal:  Negative for back pain, falls, joint pain and myalgias.  Skin:  Negative for itching and rash.  Neurological:  Positive for weakness. Negative for dizziness, sensory change, focal weakness, loss of consciousness and headaches.  Endo/Heme/Allergies:  Does not bruise/bleed easily.  Psychiatric/Behavioral:  Negative for depression, memory loss  and suicidal ideas. The patient is not nervous/anxious.    Past Medical History:  Diagnosis Date   Asthma    Diabetes mellitus without complication ([place])    Gout    Hyperlipidemia    Hypertension    Obesity     Social History   Tobacco Use   Smoking status: Every Day    Packs/day: 0.50    Pack years: 0.00    Types: Cigarettes   Smokeless tobacco: Never  Substance Use Topics   Alcohol use: Yes   Drug use: Yes    Frequency: 1.0 times per week    Types: Marijuana    Comment: marijuana at midnight last night    Family History  Problem Relation Age of Onset   Asthma Mother    Cancer Mother    Diabetes Mother    Obesity Mother    Obesity Sister    Diabetes Brother    Obesity Brother  Cancer Maternal Grandmother    Diabetes Maternal Grandmother    Diabetes Other    No Known Allergies  OBJECTIVE: Blood pressure 133/83, pulse (!) 102, temperature 99.7 F (37.6 C), temperature source Oral, resp. rate 16, height '5\' 9"'$  (1.753 m), weight 92.7 kg, SpO2 98 %.  Physical Exam Constitutional:      General: He is not in acute distress.    Appearance: Normal appearance. He is well-developed. He is not ill-appearing or diaphoretic.  HENT:     Head: Normocephalic and atraumatic.     Right Ear: Hearing and external ear normal.     Left Ear: Hearing and external ear normal.     Nose: No nasal deformity or rhinorrhea.     Mouth/Throat:     Tongue: No lesions.     Palate: No mass.     Pharynx: Oropharyngeal exudate present.     Comments: Has a slight coating to his tongue but not clear-cut thrush Eyes:     General: No scleral icterus.    Extraocular Movements: Extraocular movements intact.     Conjunctiva/sclera: Conjunctivae normal.     Right eye: Right conjunctiva is not injected.     Left eye: Left conjunctiva is not injected.     Pupils: Pupils are equal, round, and reactive to light.  Neck:     Vascular: No JVD.  Cardiovascular:     Rate and Rhythm: Normal rate and  regular rhythm.     Heart sounds: Normal heart sounds, S1 normal and S2 normal. No murmur heard.   No friction rub. No gallop.  Pulmonary:     Effort: No respiratory distress.     Breath sounds: No stridor. No wheezing, rhonchi or rales.  Chest:     Chest wall: No tenderness.  Abdominal:     General: Bowel sounds are normal. There is no distension.     Palpations: Abdomen is soft.     Tenderness: There is no abdominal tenderness.  Musculoskeletal:        General: Normal range of motion.     Right shoulder: Normal.     Left shoulder: Normal.     Cervical back: Normal range of motion and neck supple.     Right hip: Normal.     Left hip: Normal.     Right knee: Normal.     Left knee: Normal.  Lymphadenopathy:     Head:     Right side of head: No submandibular, preauricular or posterior auricular adenopathy.     Left side of head: No submandibular, preauricular or posterior auricular adenopathy.     Cervical: No cervical adenopathy.     Right cervical: No superficial or deep cervical adenopathy.    Left cervical: No superficial or deep cervical adenopathy.  Skin:    General: Skin is warm and dry.     Coloration: Skin is not jaundiced or pale.     Findings: No abrasion, bruising, ecchymosis, erythema, lesion or rash.     Nails: There is no clubbing.  Neurological:     General: No focal deficit present.     Mental Status: He is alert and oriented to person, place, and time.     Sensory: No sensory deficit.     Coordination: Coordination normal.     Gait: Gait normal.  Psychiatric:        Attention and Perception: He is attentive.        Mood and Affect: Mood normal.  Speech: Speech normal.        Behavior: Behavior normal. Behavior is cooperative.        Thought Content: Thought content normal.        Judgment: Judgment normal.    Lab Results Lab Results  Component Value Date   WBC 9.4 02/12/2021   HGB 7.8 (L) 02/12/2021   HCT 24.7 (L) 02/12/2021   MCV 90.5  02/12/2021   PLT 92 (L) 02/12/2021    Lab Results  Component Value Date   CREATININE 3.87 (H) 02/12/2021   BUN 83 (H) 02/12/2021   NA 126 (L) 02/12/2021   K 4.9 02/12/2021   CL 100 02/12/2021   CO2 16 (L) 02/12/2021    Lab Results  Component Value Date   ALT 15 02/12/2021   AST 32 02/12/2021   ALKPHOS 61 02/12/2021   BILITOT 0.3 02/12/2021     Microbiology: Recent Results (from the past 240 hour(s))  Resp Panel by RT-PCR (Flu A&B, Covid) Nasopharyngeal Swab     Status: None   Collection Time: 02/11/21 11:08 PM   Specimen: Nasopharyngeal Swab; Nasopharyngeal(NP) swabs in vial transport medium  Result Value Ref Range Status   SARS Coronavirus 2 by RT PCR NEGATIVE NEGATIVE Final    Comment: (NOTE) SARS-CoV-2 target nucleic acids are NOT DETECTED.  The SARS-CoV-2 RNA is generally detectable in upper respiratory specimens during the acute phase of infection. The lowest concentration of SARS-CoV-2 viral copies this assay can detect is 138 copies/mL. A negative result does not preclude SARS-Cov-2 infection and should not be used as the sole basis for treatment or other patient management decisions. A negative result may occur with  improper specimen collection/handling, submission of specimen other than nasopharyngeal swab, presence of viral mutation(s) within the areas targeted by this assay, and inadequate number of viral copies(<138 copies/mL). A negative result must be combined with clinical observations, patient history, and epidemiological information. The expected result is Negative.  Fact Sheet for Patients:  EntrepreneurPulse.com.au  Fact Sheet for Healthcare Providers:  IncredibleEmployment.be  This test is no t yet approved or cleared by the [place] FDA and  has been authorized for detection and/or diagnosis of SARS-CoV-2 by FDA under an Emergency Use Authorization (EUA). This EUA will remain  in effect (meaning this  test can be used) for the duration of the COVID-19 declaration under Section 564(b)(1) of the Act, 21 U.S.C.section 360bbb-3(b)(1), unless the authorization is terminated  or revoked sooner.       Influenza A by PCR NEGATIVE NEGATIVE Final   Influenza B by PCR NEGATIVE NEGATIVE Final    Comment: (NOTE) The Xpert Xpress SARS-CoV-2/FLU/RSV plus assay is intended as an aid in the diagnosis of influenza from Nasopharyngeal swab specimens and should not be used as a sole basis for treatment. Nasal washings and aspirates are unacceptable for Xpert Xpress SARS-CoV-2/FLU/RSV testing.  Fact Sheet for Patients: EntrepreneurPulse.com.au  Fact Sheet for Healthcare Providers: IncredibleEmployment.be  This test is not yet approved or cleared by the [place] FDA and has been authorized for detection and/or diagnosis of SARS-CoV-2 by FDA under an Emergency Use Authorization (EUA). This EUA will remain in effect (meaning this test can be used) for the duration of the COVID-19 declaration under Section 564(b)(1) of the Act, 21 U.S.C. section 360bbb-3(b)(1), unless the authorization is terminated or revoked.  Performed at [facility], [place] [address] [place], [place] [address]     Alcide Evener, [place] [facility] [phone] pager  02/13/2021, 9:57 AM

## 2021-02-13 NOTE — Plan of Care (Signed)
  Problem: Coping: Goal: Level of anxiety will decrease Outcome: Progressing   Problem: Pain Managment: Goal: General experience of comfort will improve Outcome: Progressing   

## 2021-02-13 NOTE — Progress Notes (Signed)
Patient unable to tolerate oral contrast even after anti-emetic given, vomited immediately after drinking, went for CT without contrast.

## 2021-02-13 NOTE — Progress Notes (Signed)
During shift report patient not found in room. Was previously rounded on half hour earlier and was in bed. Called patient cell and he said he was just "walking around and needed to stretch his legs" patient on his way back. Informed patient next time he needs to walk, inform the nurse or tech and we can assist and to not sneak out through the stairwell. Patient agreeable. Night shift will continue to watch and notify security if needed.

## 2021-02-13 NOTE — Plan of Care (Signed)
  Problem: Education: Goal: Knowledge of General Education information will improve Description: Including pain rating scale, medication(s)/side effects and non-pharmacologic comfort measures Outcome: Progressing   Problem: Pain Managment: Goal: General experience of comfort will improve Outcome: Progressing   Problem: Safety: Goal: Ability to remain free from injury will improve Outcome: Progressing   

## 2021-02-13 NOTE — Progress Notes (Signed)
Patient arrived back to unit off of elevator, gait unsteady and patient seems slightly disoriented and did not know where his room was. This nurse assisted patient back to his room. Patient received phone call from male caller and I heard her say "she didn't see your arm did she?" And then the caller laughed and patient quickly hung up the phone call. IV in left hand is still intact, wrapped with coban. NS received call from lobby receptionist that she witnessed patient getting out of a car and he came inside saying he had just gone home to grab some food. Patient aware that he is only to consume clear liquids but brought back a large bag of solid food. I again explained the diet order and the rationale for it. AC Misha was notified of patient presumably leaving the campus and on-call provider Clearence Ped) made aware as well. Urine drug screen ordered. AC came to speak to patient about not being able to leave the unit. Patient in agreement, saying "yes ma'am" repeatedly. VSS, will continue to monitor.

## 2021-02-14 DIAGNOSIS — K2100 Gastro-esophageal reflux disease with esophagitis, without bleeding: Secondary | ICD-10-CM

## 2021-02-14 DIAGNOSIS — R1319 Other dysphagia: Secondary | ICD-10-CM

## 2021-02-14 DIAGNOSIS — B20 Human immunodeficiency virus [HIV] disease: Secondary | ICD-10-CM

## 2021-02-14 DIAGNOSIS — R1114 Bilious vomiting: Secondary | ICD-10-CM

## 2021-02-14 DIAGNOSIS — R059 Cough, unspecified: Secondary | ICD-10-CM

## 2021-02-14 DIAGNOSIS — R112 Nausea with vomiting, unspecified: Secondary | ICD-10-CM

## 2021-02-14 DIAGNOSIS — R109 Unspecified abdominal pain: Secondary | ICD-10-CM

## 2021-02-14 DIAGNOSIS — K922 Gastrointestinal hemorrhage, unspecified: Secondary | ICD-10-CM

## 2021-02-14 DIAGNOSIS — R7989 Other specified abnormal findings of blood chemistry: Secondary | ICD-10-CM

## 2021-02-14 LAB — CBC
HCT: 28.5 % — ABNORMAL LOW (ref 39.0–52.0)
Hemoglobin: 9 g/dL — ABNORMAL LOW (ref 13.0–17.0)
MCH: 29 pg (ref 26.0–34.0)
MCHC: 31.6 g/dL (ref 30.0–36.0)
MCV: 91.9 fL (ref 80.0–100.0)
Platelets: 115 10*3/uL — ABNORMAL LOW (ref 150–400)
RBC: 3.1 MIL/uL — ABNORMAL LOW (ref 4.22–5.81)
RDW: 14.6 % (ref 11.5–15.5)
WBC: 4.4 10*3/uL (ref 4.0–10.5)
nRBC: 0 % (ref 0.0–0.2)

## 2021-02-14 LAB — GLUCOSE, CAPILLARY
Glucose-Capillary: 111 mg/dL — ABNORMAL HIGH (ref 70–99)
Glucose-Capillary: 113 mg/dL — ABNORMAL HIGH (ref 70–99)
Glucose-Capillary: 101 mg/dL — ABNORMAL HIGH (ref 70–99)
Glucose-Capillary: 104 mg/dL — ABNORMAL HIGH (ref 70–99)

## 2021-02-14 LAB — BASIC METABOLIC PANEL
Anion gap: 10 (ref 5–15)
BUN: 53 mg/dL — ABNORMAL HIGH (ref 6–20)
CO2: 19 mmol/L — ABNORMAL LOW (ref 22–32)
Calcium: 9.4 mg/dL (ref 8.9–10.3)
Chloride: 106 mmol/L (ref 98–111)
Creatinine, Ser: 2.59 mg/dL — ABNORMAL HIGH (ref 0.61–1.24)
GFR, Estimated: 30 mL/min — ABNORMAL LOW (ref >60)
Glucose, Bld: 112 mg/dL — ABNORMAL HIGH (ref 70–99)
Potassium: 5.2 mmol/L — ABNORMAL HIGH (ref 3.5–5.1)
Sodium: 135 mmol/L (ref 135–145)

## 2021-02-14 LAB — MAGNESIUM: Magnesium: 1.7 mg/dL (ref 1.7–2.4)

## 2021-02-14 MED ORDER — DAPSONE 100 MG PO TABS
100.0000 mg | ORAL_TABLET | Freq: Every day | ORAL | Status: DC
  Administered 2021-02-14 – 2021-02-15 (×2): 100 mg via ORAL
  Filled 2021-02-14 (×2): qty 1

## 2021-02-14 NOTE — Plan of Care (Signed)
°  Problem: Coping: °Goal: Level of anxiety will decrease °Outcome: Progressing °  °

## 2021-02-14 NOTE — Progress Notes (Signed)
Patient sleeping on bench in room near window, no complaints at this time as far as N/V or upset stomach, did not wish to take his protonix this evening, has completed about 1/2 of the jug of Nuly[place], states he has had multiple BM's this evening which are mainly yellowish liquid with very few small pieces of brown stool, encouraged patient to keep working on finishing the medication and also reiterated that he will be npo after midnight tonight to prep for his procedures tomorrow, vss, patient is independent in the room, will continue to monitor.

## 2021-02-14 NOTE — Progress Notes (Signed)
PROGRESS NOTE    Bruce Hunter  LXB:[id number] DOB: June 27, 1976 DOA: 02/11/2021 PCP: Elwyn Reach, MD    Chief Complaint  Patient presents with   Hypoglycemia    Brief Narrative:   Bruce Hunter  is a 45 y.o. male, with history of asthma, diabetes mellitus type 2, GERD, hyperlipidemia, hypertension, presents the ED with a chief complaint of hypoglycemia.  Glucose in the ED at presentation was 62, patient was given snacks, started on D5.  His glucose dropped to 42, he was given an amp of D50 and D10 was added to the D5.  Glucose then improved to 77, went back down to 42 Patient was admitted after the second 42, glucagon was given and improved glucose to 117 glucose then dropped again to 83, patient was given orange juice and improved to 118  Subjective:  Feeling better, denies pain, no cough He is drinking the bowel prep  No fever Mother at bedside     Assessment & Plan:   Active Problems:   AKI (acute kidney injury) ([place])   Hypoglycemia   HTN (hypertension)   Anemia  Hypocalcemia, with history of non-insulin-dependent type 2 diabetes for 10 years, on glipizide and metformin at home  likely due to poor oral intake from nausea vomiting, he report continue to take glipizide and metformin despite poor oral intake prior to admission Blood glucose improved , off d10 infusion  close monitor blood sugar, continue hypoglycemic protocol A1c 5.9 ( may not be accurate in the setting of anemia and renal impairment)  proinsulin, insulin and C-peptide level , sulfonylurea level in process    falls and syncope episode in the last month  EKG no acute findings with sinus rhythm ,normal QTC Likely from hypoglycemia  Nausea vomiting with 60 pounds of weight loss in the last few month -No abdominal pain, KUB no acute findings -UDS is positive for marijuana -He also has diagnosis of noninsulin-dependent type 2 diabetes for 10 years, cannot rule out gastroparesis -Gastric emptying  study ordered, need to be narcotic free for 2 days and n.p.o. after midnight prior to procedure -[facility] consulted, plan for EGD/colonoscopy planned on Monday at 1 PM  Normocytic anemia No overt sign of external bleed FOBT+ Does has iron deficiency, low folate, borderline low B12, reticulocyte count inappropriately low Start folate and B12 supplement, start oral iron supplement  Will benefit from iv iron infusion   Thrombocytopenia Unclear etiology, no sign of hemolysis, no sign of clotting disorder or active bleeding Likely from nutrition deficit from poor oral intake Start B12 folate supplement Monitor CBC  Hyponatremia Likely combination from poor oral intake plus D10 infusion, off d10 Start sodium bicarb supplement due to metabolic acidosis normlaized  Hypomagnesemia Replace mag IV, improved   AKI on CKD/metabolic acidosis Creatinine 2.8 from 2021 Creatinine on presentation 4.39 Renal ultrasound no hydronephrosis UA positive for protein no infection Continue hydration, hold lisinopril and metformin Patient should avoid NSAIDs, discontinue Mobic from home med list Repeat BMP.  Renal dosing medication  Hypertension -Report not taking Norvasc, report only take lisinopril /HCTZ at home which is on hold since admission due to AKI -Blood pressure low normal likely due to dehydration, continue hydration  HIV New diagnosis ID input appreciated     Body mass index is 30.18 kg/m.[name]      Unresulted Labs (From admission, onward)     Start     Ordered   02/18/21 0500  Creatinine, serum  (enoxaparin (LOVENOX)  CrCl < 30 ml/min)  Weekly,   R     Comments: while on enoxaparin therapy.    02/12/21 0041   02/14/21 0500  CBC  Daily,   R      02/13/21 0807   02/14/21 8242  Basic metabolic panel  Daily,   R      02/13/21 0807   02/14/21 0410  Acid Fast Smear (AFB)  Once,   R        02/14/21 0410   02/13/21 1200  Acid Fast Culture with reflexed sensitivities  Once,   R         02/13/21 1200   02/13/21 1020  Glucose 6 phosphate dehydrogenase  Once,   R        02/13/21 1020   02/13/21 0900  HIV-1 RNA, PCR (Graph) Rfx/Geno EDI  Once,   R        02/13/21 0859   02/13/21 0500  C-peptide  Tomorrow morning,   R        02/12/21 1529   02/13/21 0500  Sulfonylurea Hypoglycemics Panel, blood  Tomorrow morning,   R        02/12/21 1529   02/13/21 0500  Insulin, random  Tomorrow morning,   R        02/12/21 1529   02/13/21 0500  Proinsulin/insulin ratio  Tomorrow morning,   R        02/12/21 1529   02/13/21 0500  HLA B*5701  Tomorrow morning,   R        02/12/21 1701   02/12/21 1702  QuantiFERON-TB Gold Plus  Once,   R        02/12/21 1701   02/12/21 1701  Cd4/cd8 (t-helper/t-suppressor cell)  Once,   R        02/12/21 1701   02/12/21 0322  HIV-1/2 AB - differentiation  Once,   AD        02/12/21 0322   Unscheduled  Occult blood card to lab, stool  As needed,   R      02/12/21 1459              DVT prophylaxis: SCDs Start: 02/12/21 0041   Code Status: Full Family Communication: significant other at bedside on 6/10, mother at bedside on 6/12 Disposition:   Status is: Inpatient  Dispo: The patient is from: Home              Anticipated d/c is to: Home              Anticipated d/c date is: Currently not medically stable to discharge, will likely need to stay until early next week                Consultants:  [facility] ID  Procedures:  Gastric emptying study ordered, but will not be done on the weekend EGD planned   Antimicrobials:   Anti-infectives (From admission, onward)    Start     Dose/Rate Route Frequency Ordered Stop   02/13/21 1000  dolutegravir (TIVICAY) tablet 50 mg        50 mg Oral Daily 02/13/21 0900     02/13/21 1000  lamiVUDine (EPIVIR) tablet 300 mg       Note to Pharmacy: I DO NOT renally adjust lamivudine so please give at full dose   300 mg Oral Daily 02/13/21 0900             Objective: Vitals:   02/13/21  0517 02/13/21 1400 02/13/21 1954 02/14/21 0624  BP: 133/83 115/75 119/74 114/80  Pulse: (!) 102 91 98 83  Resp: _0 Temp: 99.7 F (37.6 C)  99.2 F (37.3 C) 98.2 F (36.8 C)  TempSrc: Oral  Oral Oral  SpO2: 98% 99% 97% 98%  Weight:      Height:        Intake/Output Summary (Last 24 hours) at 02/14/2021 0941 Last data filed at 02/14/2021 0618 Gross per 24 hour  Intake 600 ml  Output 200 ml  Net 400 ml   Filed Weights   02/11/21 2135 02/12/21 0501  Weight: 99.8 kg 92.7 kg    Examination:  General exam: a lot stronger,  NAD Respiratory system: Clear to auscultation. Respiratory effort normal. Cardiovascular system: S1 & S2 heard, RRR.  Gastrointestinal system: Abdomen is nondistended, soft and nontender. Normal bowel sounds heard. Central nervous system: Alert and oriented. No focal neurological deficits. Extremities: stronger Skin: No rashes, lesions or ulcers Psychiatry: Flat affect    Data Reviewed: I have personally reviewed following labs and imaging studies  CBC: Recent Labs  Lab 02/11/21 1900 02/12/21 0322 02/13/21 1107 02/14/21 0353  WBC 10.0 9.4 4.9 4.4  NEUTROABS 8.7*  --   --   --   HGB 8.9* 7.8* 9.0* 9.0*  HCT 28.7* 24.7* 28.0* 28.5*  MCV 94.7 90.5 90.6 91.9  PLT 101* 92* 111* 115*    Basic Metabolic Panel: Recent Labs  Lab 02/11/21 2200 02/12/21 0322 02/13/21 1107 02/14/21 0353  NA 130* 126* 135 135  K 4.8 4.9 5.3* 5.2*  CL 103 100 107 106  CO2 17* 16* 18* 19*  GLUCOSE 56* 126* 121* 112*  BUN 85* 83* 61* 53*  CREATININE 4.39* 3.87* 2.79* 2.59*  CALCIUM 8.4* 8.2* 9.1 9.4  MG  --  1.4* 2.0 1.7    GFR: Estimated Creatinine Clearance: 40.9 mL/min (A) (by C-G formula based on SCr of 2.59 mg/dL (H)).  Liver Function Tests: Recent Labs  Lab 02/11/21 2200 02/12/21 0322  AST 35 32  ALT 14 15  ALKPHOS 62 61  BILITOT 0.1* 0.3  PROT 7.0 6.9  ALBUMIN 2.6* 2.6*    CBG: Recent Labs  Lab 02/13/21 0723 02/13/21 1145  02/13/21 1641 02/13/21 2210 02/14/21 0720  GLUCAP 102* 130* 127* 139* 111*     Recent Results (from the past 240 hour(s))  Resp Panel by RT-PCR (Flu A&B, Covid) Nasopharyngeal Swab     Status: None   Collection Time: 02/11/21 11:08 PM   Specimen: Nasopharyngeal Swab; Nasopharyngeal(NP) swabs in vial transport medium  Result Value Ref Range Status   SARS Coronavirus 2 by RT PCR NEGATIVE NEGATIVE Final    Comment: (NOTE) SARS-CoV-2 target nucleic acids are NOT DETECTED.  The SARS-CoV-2 RNA is generally detectable in upper respiratory specimens during the acute phase of infection. The lowest concentration of SARS-CoV-2 viral copies this assay can detect is 138 copies/mL. A negative result does not preclude SARS-Cov-2 infection and should not be used as the sole basis for treatment or other patient management decisions. A negative result may occur with  improper specimen collection/handling, submission of specimen other than nasopharyngeal swab, presence of viral mutation(s) within the areas targeted by this assay, and inadequate number of viral copies(<138 copies/mL). A negative result must be combined with clinical observations, patient history, and epidemiological information. The expected result is Negative.  Fact Sheet for Patients:  EntrepreneurPulse.com.au  Fact Sheet for Healthcare Providers:  IncredibleEmployment.be  This test is no t yet approved or cleared by the [place] and  has been authorized for detection and/or diagnosis of SARS-CoV-2 by FDA under an Emergency Use Authorization (EUA). This EUA will remain  in effect (meaning this test can be used) for the duration of the COVID-19 declaration under Section 564(b)(1) of the Act, 21 U.S.C.section 360bbb-3(b)(1), unless the authorization is terminated  or revoked sooner.       Influenza A by PCR NEGATIVE NEGATIVE Final   Influenza B by PCR NEGATIVE NEGATIVE Final     Comment: (NOTE) The Xpert Xpress SARS-CoV-2/FLU/RSV plus assay is intended as an aid in the diagnosis of influenza from Nasopharyngeal swab specimens and should not be used as a sole basis for treatment. Nasal washings and aspirates are unacceptable for Xpert Xpress SARS-CoV-2/FLU/RSV testing.  Fact Sheet for Patients: EntrepreneurPulse.com.au  Fact Sheet for Healthcare Providers: IncredibleEmployment.be  This test is not yet approved or cleared by the [place] FDA and has been authorized for detection and/or diagnosis of SARS-CoV-2 by FDA under an Emergency Use Authorization (EUA). This EUA will remain in effect (meaning this test can be used) for the duration of the COVID-19 declaration under Section 564(b)(1) of the Act, 21 U.S.C. section 360bbb-3(b)(1), unless the authorization is terminated or revoked.  Performed at [facility], [place] [address] [place], [place] [address]          Radiology Studies: CT ABDOMEN PELVIS WO CONTRAST  Result Date: 02/12/2021 CLINICAL DATA:  Hypoglycemia, nausea and vomiting. EXAM: CT ABDOMEN AND PELVIS WITHOUT CONTRAST TECHNIQUE: Multidetector CT imaging of the abdomen and pelvis was performed following the standard protocol without IV contrast. COMPARISON:  None. FINDINGS: Lower chest: Minimal patchy opacity in the right lower lobe. Normal sized heart. Hepatobiliary: Small number of tiny gallstones in the gallbladder. Poorly distended gallbladder with no unexpected wall thickening or pericholecystic fluid. Unremarkable liver. Pancreas: Unremarkable. No pancreatic ductal dilatation or surrounding inflammatory changes. Spleen: Normal in size without focal abnormality. Adrenals/Urinary Tract: Adrenal glands are unremarkable. Kidneys are normal, without renal calculi, focal lesion, or hydronephrosis. Bladder is unremarkable. Stomach/Bowel: Extensive colonic diverticulosis without evidence of  diverticulitis. Normal appearing appendix. Small hiatal hernia. Unremarkable small bowel. Vascular/Lymphatic: Mild atheromatous arterial calcifications. No enlarged lymph nodes. Reproductive: Prostate is unremarkable. Other: 2 small supraumbilical hernias containing fat. Small to moderate-sized bilateral inguinal hernias containing fat, left larger than right. Musculoskeletal: Lumbosacral spine degenerative changes and mild lower thoracic spine degenerative changes. IMPRESSION: 1. No acute abnormality. 2. Small hiatal hernia. 3. 2 small supraumbilical hernias containing fat and small to moderate-sized bilateral inguinal hernias containing fat. 4. Cholelithiasis. 5. Extensive colonic diverticulosis. Electronically Signed   By: Claudie Revering M.D.   On: 02/12/2021 22:00   DG Chest 2 View  Result Date: 02/13/2021 CLINICAL DATA:  Cough. EXAM: CHEST - 2 VIEW COMPARISON:  February 13, 2021 and Jan 13, 2011 FINDINGS: The heart, hila, mediastinum are unremarkable. No pneumothorax. No nodules or masses. No focal infiltrates. IMPRESSION: No active cardiopulmonary disease. Electronically Signed   By: Dorise Bullion III M.D   On: 02/13/2021 23:56   DG Abd 1 View  Result Date: 02/12/2021 CLINICAL DATA:  Nausea, vomiting and abdominal pain. EXAM: ABDOMEN - 1 VIEW COMPARISON:  None. FINDINGS: The bowel gas pattern is normal. No radio-opaque calculi or other significant radiographic abnormality are seen. IMPRESSION: Negative exam. Electronically Signed   By: Inge Rise M.D.   On: 02/12/2021 11:14   US RENAL  Result  Date: 02/12/2021 CLINICAL DATA:  Elevated creatinine. EXAM: RENAL / URINARY TRACT ULTRASOUND COMPLETE COMPARISON:  None. FINDINGS: Right Kidney: Renal measurements: 9.7 x 6.2 x 6.5 cm = volume: 244 mL. Echogenicity within normal limits. No mass or hydronephrosis visualized. Left Kidney: Renal measurements: 12.8 x 7.1 x 6.0 cm = volume: 285 mL. Echogenicity within normal limits. No mass or hydronephrosis  visualized. Bladder: Appears normal for degree of bladder distention. Bilateral ureteral jets visualized. Other: Evaluation limited by patient respiratory motion. IMPRESSION: No hydronephrosis. Electronically Signed   By: Margaretha Sheffield MD   On: 02/12/2021 11:41   DG CHEST PORT 1 VIEW  Result Date: 02/13/2021 CLINICAL DATA:  Cough EXAM: PORTABLE CHEST 1 VIEW COMPARISON:  Jan 13, 2011 FINDINGS: Evaluation is limited secondary to patient rotation. Apparent thickening of the RIGHT paratracheal stripe, possibly positional in etiology. Increased nodular appearance of the superior RIGHT hilar border. No pleural effusion or pneumothorax. No acute pleuroparenchymal abnormality. No acute osseous abnormality. IMPRESSION: Apparent changes of the RIGHT paratracheal stripe and hilar border may be positional/technical in etiology. Recommend further evaluation with dedicated PA and lateral chest radiograph versus chest CT. Electronically Signed   By: Valentino Saxon MD   On: 02/13/2021 12:00        Scheduled Meds:  dolutegravir  50 mg Oral Daily   folic acid  1 mg Oral Daily   insulin aspart  0-6 Units Subcutaneous TID WC   lamiVUDine  300 mg Oral Daily   pantoprazole (PROTONIX) IV  40 mg Intravenous Q24H   polyethylene glycol-electrolytes  4,000 mL Oral Once   sodium bicarbonate  650 mg Oral TID   vitamin B-12  1,000 mcg Oral Daily   Continuous Infusions:  sodium chloride     sodium chloride       LOS: 2 days   Time spent: 29mns Greater than 50% of this time was spent in counseling, explanation of diagnosis, planning of further management, and coordination of care.   Voice Recognition /Viviann Sparedictation system was used to create this note, attempts have been made to correct errors. Please contact the author with questions and/or clarifications.   FFlorencia Reasons MD PhD FACP [facility]  Available via Epic secure chat 7am-7pm for nonurgent issues Please page for urgent issues To page  the attending provider between 7A-7P or the covering provider during after hours 7P-7A, please log into the web site [email or web address] and access using universal [place] password for that web site. If you do not have the password, please call the hospital operator.    02/14/2021, 9:41 AM

## 2021-02-14 NOTE — Plan of Care (Signed)
  Problem: Coping: Goal: Level of anxiety will decrease Outcome: Progressing   Problem: Pain Managment: Goal: General experience of comfort will improve Outcome: Progressing   
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
## 2021-02-14 NOTE — H&P (View-Only) (Signed)
Positive urine drug screen for [facility] noted after patient had gone off campus (unauthorized).  This raises question whether the patient's admission nausea and vomiting may have been cannabinoid hyperemesis.  Hemoglobin remained stable at 9.0.  At this time, it still seems appropriate to proceed with endoscopy and colonoscopy as scheduled for 1 PM tomorrow for evaluation of normocytic anemia with low iron saturation, heme positive stool, and presenting nausea and vomiting.  Cleotis Nipper, M.D. Pager [phone] If no answer or after 5 PM call [phone]

## 2021-02-14 NOTE — Progress Notes (Signed)
Positive urine drug screen for [facility] noted after patient had gone off campus (unauthorized).  This raises question whether the patient's admission nausea and vomiting may have been cannabinoid hyperemesis.  Hemoglobin remained stable at 9.0.  At this time, it still seems appropriate to proceed with endoscopy and colonoscopy as scheduled for 1 PM tomorrow for evaluation of normocytic anemia with low iron saturation, heme positive stool, and presenting nausea and vomiting.  Cleotis Nipper, M.D. Pager [phone] If no answer or after 5 PM call [phone]

## 2021-02-14 NOTE — Plan of Care (Signed)
  Problem: Education: Goal: Knowledge of General Education information will improve Description: Including pain rating scale, medication(s)/side effects and non-pharmacologic comfort measures Outcome: Progressing   Problem: Nutrition: Goal: Adequate nutrition will be maintained Outcome: Progressing   Problem: Coping: Goal: Level of anxiety will decrease Outcome: Progressing   

## 2021-02-15 ENCOUNTER — Telehealth: Payer: Self-pay

## 2021-02-15 ENCOUNTER — Inpatient Hospital Stay (HOSPITAL_COMMUNITY): Payer: Medicaid Other | Admitting: Registered Nurse

## 2021-02-15 ENCOUNTER — Encounter (HOSPITAL_COMMUNITY): Payer: Self-pay | Admitting: Internal Medicine

## 2021-02-15 ENCOUNTER — Other Ambulatory Visit (HOSPITAL_COMMUNITY): Payer: Self-pay

## 2021-02-15 ENCOUNTER — Encounter (HOSPITAL_COMMUNITY)
Admission: EM | Disposition: A | Discharge status: Home / Self Care | Payer: Self-pay | Source: Home / Self Care | Attending: Internal Medicine

## 2021-02-15 DIAGNOSIS — R059 Cough, unspecified: Secondary | ICD-10-CM

## 2021-02-15 DIAGNOSIS — B3781 Candidal esophagitis: Secondary | ICD-10-CM

## 2021-02-15 DIAGNOSIS — R112 Nausea with vomiting, unspecified: Secondary | ICD-10-CM

## 2021-02-15 DIAGNOSIS — R7989 Other specified abnormal findings of blood chemistry: Secondary | ICD-10-CM

## 2021-02-15 DIAGNOSIS — R1319 Other dysphagia: Secondary | ICD-10-CM

## 2021-02-15 DIAGNOSIS — K2100 Gastro-esophageal reflux disease with esophagitis, without bleeding: Secondary | ICD-10-CM

## 2021-02-15 DIAGNOSIS — R109 Unspecified abdominal pain: Secondary | ICD-10-CM

## 2021-02-15 HISTORY — PX: COLONOSCOPY WITH PROPOFOL: SHX5780

## 2021-02-15 HISTORY — PX: BIOPSY: SHX5522

## 2021-02-15 HISTORY — PX: ESOPHAGOGASTRODUODENOSCOPY (EGD) WITH PROPOFOL: SHX5813

## 2021-02-15 LAB — CBC
HCT: 29.5 % — ABNORMAL LOW (ref 39.0–52.0)
Hemoglobin: 9.1 g/dL — ABNORMAL LOW (ref 13.0–17.0)
MCH: 28.7 pg (ref 26.0–34.0)
MCHC: 30.8 g/dL (ref 30.0–36.0)
MCV: 93.1 fL (ref 80.0–100.0)
Platelets: 117 10*3/uL — ABNORMAL LOW (ref 150–400)
RBC: 3.17 MIL/uL — ABNORMAL LOW (ref 4.22–5.81)
RDW: 14.7 % (ref 11.5–15.5)
WBC: 3.9 10*3/uL — ABNORMAL LOW (ref 4.0–10.5)
nRBC: 0 % (ref 0.0–0.2)

## 2021-02-15 LAB — BASIC METABOLIC PANEL
Anion gap: 8 (ref 5–15)
BUN: 45 mg/dL — ABNORMAL HIGH (ref 6–20)
CO2: 18 mmol/L — ABNORMAL LOW (ref 22–32)
Calcium: 9.3 mg/dL (ref 8.9–10.3)
Chloride: 108 mmol/L (ref 98–111)
Creatinine, Ser: 2.74 mg/dL — ABNORMAL HIGH (ref 0.61–1.24)
GFR, Estimated: 28 mL/min — ABNORMAL LOW (ref >60)
Glucose, Bld: 90 mg/dL (ref 70–99)
Potassium: 5.8 mmol/L — ABNORMAL HIGH (ref 3.5–5.1)
Sodium: 134 mmol/L — ABNORMAL LOW (ref 135–145)

## 2021-02-15 LAB — GLUCOSE, CAPILLARY
Glucose-Capillary: 88 mg/dL (ref 70–99)
Glucose-Capillary: 96 mg/dL (ref 70–99)

## 2021-02-15 LAB — HIV-1/2 AB - DIFFERENTIATION
HIV 1 Ab: REACTIVE
HIV 2 Ab: NONREACTIVE

## 2021-02-15 LAB — GLUCOSE 6 PHOSPHATE DEHYDROGENASE
G6PDH: 9.5 U/g{Hb} (ref 3.8–14.2)
Hemoglobin: 8.9 g/dL — ABNORMAL LOW (ref 13.0–17.7)

## 2021-02-15 LAB — INSULIN, RANDOM: Insulin: 7.6 u[IU]/mL (ref 2.6–24.9)

## 2021-02-15 LAB — C-PEPTIDE: C-Peptide: 3.3 ng/mL (ref 1.1–4.4)

## 2021-02-15 SURGERY — ESOPHAGOGASTRODUODENOSCOPY (EGD) WITH PROPOFOL
Anesthesia: Monitor Anesthesia Care

## 2021-02-15 MED ORDER — PROPOFOL 10 MG/ML IV BOLUS
INTRAVENOUS | Status: DC | PRN
  Administered 2021-02-15 (×2): 50 mg via INTRAVENOUS
  Administered 2021-02-15: 40 mg via INTRAVENOUS
  Administered 2021-02-15 (×2): 50 mg via INTRAVENOUS

## 2021-02-15 MED ORDER — OMEPRAZOLE 20 MG PO CPDR: 20.0000 mg | DELAYED_RELEASE_CAPSULE | Freq: Every day | ORAL | 2 refills | Status: DC

## 2021-02-15 MED ORDER — PROPOFOL 500 MG/50ML IV EMUL
INTRAVENOUS | Status: DC | PRN
  Administered 2021-02-15: 120 ug/kg/min via INTRAVENOUS

## 2021-02-15 MED ORDER — ONDANSETRON HCL 4 MG PO TABS: 4.0000 mg | ORAL_TABLET | Freq: Four times a day (QID) | ORAL | 0 refills | Status: DC | PRN

## 2021-02-15 MED ORDER — CYANOCOBALAMIN 1000 MCG PO TABS: 1000.0000 ug | ORAL_TABLET | Freq: Every day | ORAL | 2 refills | Status: DC

## 2021-02-15 MED ORDER — FLUCONAZOLE 100 MG PO TABS
400.0000 mg | ORAL_TABLET | Freq: Every day | ORAL | Status: DC
  Administered 2021-02-15: 400 mg via ORAL
  Filled 2021-02-15: qty 4

## 2021-02-15 MED ORDER — DAPSONE 100 MG PO TABS: 100.0000 mg | ORAL_TABLET | Freq: Every day | ORAL | 3 refills | Status: DC

## 2021-02-15 MED ORDER — LAMIVUDINE 300 MG PO TABS: 300.0000 mg | ORAL_TABLET | Freq: Every day | ORAL | 2 refills | Status: DC

## 2021-02-15 MED ORDER — SODIUM BICARBONATE 650 MG PO TABS: 650.0000 mg | ORAL_TABLET | Freq: Three times a day (TID) | ORAL | 2 refills | Status: DC

## 2021-02-15 MED ORDER — PROPOFOL 1000 MG/100ML IV EMUL
INTRAVENOUS | Status: AC
  Filled 2021-02-15: qty 100

## 2021-02-15 MED ORDER — FLUCONAZOLE 100 MG PO TABS: 100.0000 mg | ORAL_TABLET | Freq: Every day | ORAL | 0 refills | Status: AC

## 2021-02-15 MED ORDER — DOLUTEGRAVIR SODIUM 50 MG PO TABS: 50.0000 mg | ORAL_TABLET | Freq: Every day | ORAL | 2 refills | Status: DC

## 2021-02-15 MED ORDER — FOLIC ACID 1 MG PO TABS: 1.0000 mg | ORAL_TABLET | Freq: Every day | ORAL | 2 refills | Status: DC

## 2021-02-15 MED ORDER — FLUCONAZOLE 100 MG PO TABS: 100.0000 mg | ORAL_TABLET | Freq: Every day | ORAL | Status: DC

## 2021-02-15 SURGICAL SUPPLY — 25 items

## 2021-02-15 NOTE — Op Note (Signed)
[facility] Patient Name: Bruce Hunter Procedure Date: 02/15/2021 MRN: [id number] Attending MD: Ronnette Juniper , MD Date of Birth: 1976-07-05 CSN: [id number] Age: 45 Admit Type: Inpatient Procedure:                Colonoscopy Indications:              This is the patient's first colonoscopy, Heme                            positive stool, Unexplained iron deficiency anemia Providers:                Ronnette Juniper, MD, Jeanella Cara, RN, Cletis Athens, Technician Referring MD:             [facility] Medicines:                Monitored Anesthesia Care Complications:            No immediate complications. Estimated Blood Loss:     Estimated blood loss: none. Procedure:                Pre-Anesthesia Assessment:                           - Prior to the procedure, a History and Physical                            was performed, and patient medications and                            allergies were reviewed. The patient's tolerance of                            previous anesthesia was also reviewed. The risks                            and benefits of the procedure and the sedation                            options and risks were discussed with the patient.                            All questions were answered, and informed consent                            was obtained. Prior Anticoagulants: The patient has                            taken no previous anticoagulant or antiplatelet                            agents. ASA Grade Assessment: II - A patient with  mild systemic disease. After reviewing the risks                            and benefits, the patient was deemed in                            satisfactory condition to undergo the procedure.                           After obtaining informed consent, the colonoscope                            was passed under direct vision. Throughout the                             procedure, the patient's blood pressure, pulse, and                            oxygen saturations were monitored continuously. The                            [id number] Olympus pediatric colonscope                            was introduced through the anus and advanced to the                            the terminal ileum. The colonoscopy was performed                            without difficulty. The patient tolerated the                            procedure well. The quality of the bowel                            preparation was good. Scope In: Scope Out: Findings:      The perianal and digital rectal examinations were normal.      Scattered small and large-mouthed diverticula were found in the sigmoid       colon, descending colon, transverse colon, hepatic flexure, ascending       colon and cecum.      The terminal ileum appeared normal.      The ileocecal valve was severely lipomatous and erythematous.      Non-bleeding internal hemorrhoids were found during retroflexion. The       hemorrhoids were large. Impression:               - Diverticulosis in the sigmoid colon, in the                            descending colon, in the transverse colon, at the                            hepatic flexure, in the ascending colon and  in the                            cecum.                           - The examined portion of the ileum was normal.                           - Lipomatous ileocecal valve.                           - Non-bleeding internal hemorrhoids.                           - No specimens collected. Moderate Sedation:      Patient did not receive moderate sedation for this procedure, but       instead received monitored anesthesia care. Recommendation:           - High fiber diet.                           - Continue present medications.                           - Repeat colonoscopy in 10 years for screening                            purposes. Procedure  Code(s):        --- Professional ---                           [phone], Colonoscopy, flexible; diagnostic, including                            collection of specimen(s) by brushing or washing,                            when performed (separate procedure) Diagnosis Code(s):        --- Professional ---                           K64.8, Other hemorrhoids                           K63.89, Other specified diseases of intestine                           R19.5, Other fecal abnormalities                           D50.9, Iron deficiency anemia, unspecified                           K57.30, Diverticulosis of large intestine without                            perforation or abscess without bleeding CPT copyright 2019  American Medical Association. All rights reserved. The codes documented in this report are preliminary and upon coder review may  be revised to meet current compliance requirements. Ronnette Juniper, MD 02/15/2021 1:58:46 PM This report has been signed electronically. Number of Addenda: 0

## 2021-02-15 NOTE — Anesthesia Preprocedure Evaluation (Signed)
Anesthesia Evaluation  Patient identified by MRN, date of birth, ID band Patient awake    Reviewed: Allergy & Precautions, NPO status , Patient's Chart, lab work & pertinent test results  Airway Mallampati: II       Dental   Pulmonary Current Smoker,    Pulmonary exam normal        Cardiovascular hypertension, Pt. on medications Normal cardiovascular exam     Neuro/Psych negative neurological ROS  negative psych ROS   GI/Hepatic GERD  Medicated and Controlled,  Endo/Other  diabetes, Type 2, Oral Hypoglycemic Agents  Renal/GU Renal InsufficiencyRenal disease  negative genitourinary   Musculoskeletal negative musculoskeletal ROS (+)   Abdominal Normal abdominal exam  (+)   Peds  Hematology  (+) anemia , HIV,   Anesthesia Other Findings   Reproductive/Obstetrics                             Anesthesia Physical  Anesthesia Plan  ASA: 3  Anesthesia Plan: MAC   Post-op Pain Management:    Induction:   PONV Risk Score and Plan: TIVA and Propofol infusion  Airway Management Planned: Natural Airway and Mask  Additional Equipment: None  Intra-op Plan:   Post-operative Plan:   Informed Consent: I have reviewed the patients History and Physical, chart, labs and discussed the procedure including the risks, benefits and alternatives for the proposed anesthesia with the patient or authorized representative who has indicated his/her understanding and acceptance.       Plan Discussed with: CRNA  Anesthesia Plan Comments:         Anesthesia Quick Evaluation

## 2021-02-15 NOTE — Op Note (Signed)
[facility] Patient Name: Bruce Hunter Procedure Date: 02/15/2021 MRN: [id number] Attending MD: Ronnette Juniper , MD Date of Birth: 1976-06-15 CSN: [id number] Age: 45 Admit Type: Inpatient Procedure:                Upper GI endoscopy Indications:              Unexplained iron deficiency anemia, Heme positive                            stool Providers:                Ronnette Juniper, MD, Jeanella Cara, RN, Cletis [place], Technician Referring MD:             [facility] Medicines:                Monitored Anesthesia Care Complications:            No immediate complications. Estimated blood loss:                            Minimal. Estimated Blood Loss:     Estimated blood loss was minimal. Procedure:                Pre-Anesthesia Assessment:                           - Prior to the procedure, a History and Physical                            was performed, and patient medications and                            allergies were reviewed. The patient's tolerance of                            previous anesthesia was also reviewed. The risks                            and benefits of the procedure and the sedation                            options and risks were discussed with the patient.                            All questions were answered, and informed consent                            was obtained. Prior Anticoagulants: The patient has                            taken no previous anticoagulant or antiplatelet                            agents. ASA Grade Assessment: II -  A patient with                            mild systemic disease. After reviewing the risks                            and benefits, the patient was deemed in                            satisfactory condition to undergo the procedure.                           - Prior to the procedure, a History and Physical                            was performed, and patient medications  and                            allergies were reviewed. The patient's tolerance of                            previous anesthesia was also reviewed. The risks                            and benefits of the procedure and the sedation                            options and risks were discussed with the patient.                            All questions were answered, and informed consent                            was obtained. Prior Anticoagulants: The patient has                            taken no previous anticoagulant or antiplatelet                            agents. ASA Grade Assessment: II - A patient with                            mild systemic disease. After reviewing the risks                            and benefits, the patient was deemed in                            satisfactory condition to undergo the procedure.                           After obtaining informed consent, the endoscope was  passed under direct vision. Throughout the                            procedure, the patient's blood pressure, pulse, and                            oxygen saturations were monitored continuously. The                            [id number]) Olympus gastroscope was                            introduced through the mouth, and advanced to the                            second part of duodenum. The upper GI endoscopy was                            accomplished without difficulty. The patient                            tolerated the procedure well. Scope In: Scope Out: Findings:      Diffuse, white plaques were found in the entire esophagus suspicious for       candidiasis. Biopsies were taken with a cold forceps for histology.      The Z-line was regular and was found 40 cm from the incisors.      Patchy moderately erythematous mucosa without bleeding was found in the       gastric fundus, in the gastric body and in the gastric antrum. Biopsies       were taken  with a cold forceps for Helicobacter pylori testing.      The cardia and gastric fundus were normal on retroflexion.      Localized moderately erythematous mucosa without active bleeding and       with no stigmata of bleeding was found in the duodenal bulb.      The first portion of the duodenum and second portion of the duodenum       were normal. Impression:               - Esophageal plaques were found, suspicious for                            candidiasis. Biopsied.                           - Z-line regular, 40 cm from the incisors.                           - Erythematous mucosa in the gastric fundus,                            gastric body and antrum. Biopsied.                           - Erythematous duodenopathy.                           -  Normal first portion of the duodenum and second                            portion of the duodenum. Moderate Sedation:      Patient did not receive moderate sedation for this procedure, but       instead received monitored anesthesia care. Recommendation:           - Resume regular diet.                           - Continue present medications.                           - Diflucan (fluconazole) 100 mg PO daily for 7                            weeks. Procedure Code(s):        --- Professional ---                           [phone], Esophagogastroduodenoscopy, flexible,                            transoral; with biopsy, single or multiple Diagnosis Code(s):        --- Professional ---                           K22.9, Disease of esophagus, unspecified                           K31.89, Other diseases of stomach and duodenum                           D50.9, Iron deficiency anemia, unspecified                           R19.5, Other fecal abnormalities CPT copyright 2019 [employer]. All rights reserved. The codes documented in this report are preliminary and upon coder review may  be revised to meet current compliance  requirements. Ronnette Juniper, MD 02/15/2021 1:55:39 PM This report has been signed electronically. Number of Addenda: 0

## 2021-02-15 NOTE — Progress Notes (Signed)
Provided discharge education/instructions, all questions and concerns addressed, Pt not in distress, discharged home with belongings accompanied by his mother.

## 2021-02-15 NOTE — Telephone Encounter (Signed)
[facility] Patient Teacher, English as a foreign language completed.    The patient is insured through [employer]  and has a $0.00 copay.  We will continue to follow to see if copay assistance is needed.  Ileene Patrick, [facility] Patient [facility] Phone: [phone] Fax:  [phone]

## 2021-02-15 NOTE — Progress Notes (Signed)
[facility]    Date of Admission:  02/11/2021   Total days of antibiotics 3           ID: Bruce Hunter is a 45 y.o. male with  hiv disease, newly diagnosed  Active Problems:   AKI (acute kidney injury) ([place])   Hypoglycemia   HTN (hypertension)   Anemia   Cough   N&V (nausea and vomiting)   AIDS (acquired immune deficiency syndrome) (HCC)   Esophageal dysphagia   Gastroesophageal reflux disease with esophagitis without hemorrhage   Gastrointestinal hemorrhage   Abdominal pain   Creatinine elevation    Subjective: Underwent colonoscopy plus egd -- which was concerning for candidiasis  Medications:   dapsone  100 mg Oral Daily   dolutegravir  50 mg Oral Daily   fluconazole  400 mg Oral Daily   folic acid  1 mg Oral Daily   insulin aspart  0-6 Units Subcutaneous TID WC   lamiVUDine  300 mg Oral Daily   pantoprazole (PROTONIX) IV  40 mg Intravenous Q24H   sodium bicarbonate  650 mg Oral TID   vitamin B-12  1,000 mcg Oral Daily    Objective: Vital signs in last 24 hours: Temp:  [98.6 F (37 C)-98.8 F (37.1 C)] 98.7 F (37.1 C) (06/13 1436) Pulse Rate:  [91-106] 96 (06/13 1436) Resp:  [16-31] 19 (06/13 1436) BP: (122-151)/(74-99) 129/93 (06/13 1436) SpO2:  [93 %-100 %] 93 % (06/13 1436) Weight:  [92.7 kg] 92.7 kg (06/13 0500) Physical Exam  Constitutional: He is oriented to person, place, and time. He appears well-developed and well-nourished. No distress.  HENT:  Mouth/Throat: Oropharynx is clear and moist. No oropharyngeal exudate.  Cardiovascular: Normal rate, regular rhythm and normal heart sounds. Exam reveals no gallop and no friction rub.  No murmur heard.  Pulmonary/Chest: Effort normal and breath sounds normal. No respiratory distress. He has no wheezes.  Abdominal: Soft. Bowel sounds are normal. He exhibits no distension. There is no tenderness.  Lymphadenopathy:  He has no cervical adenopathy.  Neurological: He is alert  and oriented to person, place, and time.  Skin: Skin is warm and dry. Hyperpigmentation to legs. (Old scars per patient) Psychiatric: He has a normal mood and affect. His behavior is normal.    Lab Results Recent Labs    02/14/21 0353 02/15/21 0311  WBC 4.4 3.9*  HGB 9.0* 9.1*  HCT 28.5* 29.5*  NA 135 134*  K 5.2* 5.8*  CL 106 108  CO2 19* 18*  BUN 53* 45*  CREATININE 2.59* 2.74*    Microbiology: reviewed Studies/Results: DG Chest 2 View  Result Date: 02/13/2021 CLINICAL DATA:  Cough. EXAM: CHEST - 2 VIEW COMPARISON:  February 13, 2021 and Jan 13, 2011 FINDINGS: The heart, hila, mediastinum are unremarkable. No pneumothorax. No nodules or masses. No focal infiltrates. IMPRESSION: No active cardiopulmonary disease. Electronically Signed   By: Dorise Bullion III M.D   On: 02/13/2021 23:56     Assessment/Plan: Hiv disease = he was started on DTG plus lamivudine. He can get the combined pill, dovato, upon discharge. Can send rx for dovato for free 30 day supply  Esophageal candidiasis = recommend to treat with fluconazole '400mg'$  po daily x 14-21 days  Oi proph = continue on dapsone '100mg'$  daily  Will need to follow up on labs, has follow up appt in ID clinic at end of the week to ensure he is started on ART and continues on  treatment  Addendum = unclear if his discharge summary is including these changes. We will call patient to ensure he has these medications to start tomorrow.  [facility] Cell: [phone] Pager: [phone]  02/15/2021, 6:36 PM

## 2021-02-15 NOTE — Interval H&P Note (Signed)
History and Physical Interval Note: 44/male with anemia, FOBT positive stool,nausea and vomiting for EGD and colonoscopy today.  02/15/2021 12:34 PM  Bruce Hunter  has presented today for EGD and colonoscopy, with the diagnosis of Heme positive stool and anemia.  The various methods of treatment have been discussed with the patient and family. After consideration of risks, benefits and other options for treatment, the patient has consented to  Procedure(s): ESOPHAGOGASTRODUODENOSCOPY (EGD) WITH PROPOFOL (N/A) COLONOSCOPY WITH PROPOFOL (N/A) as a surgical intervention.  The patient's history has been reviewed, patient examined, no change in status, stable for surgery.  I have reviewed the patient's chart and labs.  Questions were answered to the patient's satisfaction.     Ronnette Juniper

## 2021-02-15 NOTE — Discharge Summary (Signed)
Physician Discharge Summary  NAVIER SCHIANO [id number] DOB: April 16, 1976 DOA: 02/11/2021  PCP: Elwyn Reach, MD  Admit date: 02/11/2021 Discharge date: 02/15/2021  Admitted From: Home  Discharge disposition: Home   Recommendations for Outpatient Follow-Up:   Follow up with your primary care provider in one week.  Check CBC, BMP, magnesium in the next visit Follow-up with infectious disease clinic for HIV follow-up. Nuclear scan has been requested as outpatient to assess for gastroparesis.  Discharge Diagnosis:   Active Problems:   AKI (acute kidney injury) ([place])   Hypoglycemia   HTN (hypertension)   Anemia   Cough   N&V (nausea and vomiting)   AIDS (acquired immune deficiency syndrome) ([facility])   Esophageal dysphagia   Gastroesophageal reflux disease with esophagitis without hemorrhage   Gastrointestinal hemorrhage   Abdominal pain   Creatinine elevation    Discharge Condition: Improved.  Diet recommendation: Low sodium, heart healthy.    Wound care: None.  Code status: Full.   History of Present Illness:   Bruce Hunter  is a 45 y.o. male, with history of asthma, diabetes mellitus type 2, GERD, hyperlipidemia, hypertension, presented to the hospital with complaints of hypoglycemia with blood glucose level of 62.  He was also given glucagon.  He was then admitted hospital for further evaluation and treatment.    Hospital Course:   Following conditions were addressed during hospitalization as listed below,  Hypoglycemia with history of non-insulin-dependent type 2 diabetes  On glipizide and metformin at home.  Due to poor intake from nausea vomiting.  Improved with D10 infusion.  Hemoglobin A1c was 5.9.     falls and syncope  In the last month.  Could be from hypoglycemia as well.     Nausea vomiting with 60 pounds of weight loss  Patient tested positive for HIV.  Medications.  Stated during hospitalization after seen by infectious disease.  He also  underwent EGD and colonoscopy with no specific findings except for Candida esophagitis.  He was advised to get outpatient nuclear scan to assess for gastroparesis.    Normocytic anemia Did not have any overt signs of bleeding.    Thrombocytopenia Was initially unclear but could be from HIV.   Hyponatremia Improved  during subsequent hospitalization.   Hypomagnesemia Magnesium replaced and improved   AKI on CKD/metabolic acidosis Creatinine 2.8 from 2021.  On presentation creatinine was 4.3.  Renal ultrasound showed no hydronephrosis.  Lisinopril and metformin were initially kept on hold.  Was discouraged against NSAIDs.  Creatinine prior to discharge was 2.7 and at baseline.  Essential hypertension. Continue amlodipine, HCTZ and lisinopril from home on discharge.   HIV New diagnosis.  Patient was initiated on lamivudine, Tivicay, dapsone and fluconazole on discharge.  Patient does have an appointment with infectious disease as outpatient.  Disposition.  At this time, patient is stable for disposition with outpatient PCP and ID follow-up.  Medical Consultants:   Infectious disease GI  Procedures:    EGD and colonoscopy Subjective:   Today, patient was seen and examined at bedside.  Denies any nausea vomiting fever abdominal pain  Discharge Exam:   Vitals:   02/15/21 1411 02/15/21 1436  BP: 135/90 (!) 129/93  Pulse:  96  Resp: (!) 22 19  Temp:  98.7 F (37.1 C)  SpO2:  93%   Vitals:   02/15/21 1357 02/15/21 1407 02/15/21 1411 02/15/21 1436  BP: (!) 133/97 (!) 151/90 135/90 (!) 129/93  Pulse: (!) 106 94  96  Resp:  20 (!) 31 (!) 22 19  Temp: 98.7 F (37.1 C)   98.7 F (37.1 C)  TempSrc: Oral   Oral  SpO2: 98% 100%  93%  Weight:      Height:        General: Alert awake, not in obvious distress HENT: pupils equally reacting to light,  No scleral pallor or icterus noted. Oral mucosa is moist.  Chest:  Clear breath sounds.  Diminished breath sounds  bilaterally. No crackles or wheezes.  CVS: S1 &S2 heard. No murmur.  Regular rate and rhythm. Abdomen: Soft, nontender, nondistended.  Bowel sounds are heard.   Extremities: No cyanosis, clubbing or edema.  Peripheral pulses are palpable. Psych: Alert, awake and oriented, normal mood CNS:  No cranial nerve deficits.  Power equal in all extremities.   Skin: Warm and dry.  No rashes noted.  The results of significant diagnostics from this hospitalization (including imaging, microbiology, ancillary and laboratory) are listed below for reference.     Diagnostic Studies:   CT ABDOMEN PELVIS WO CONTRAST  Result Date: 02/12/2021 CLINICAL DATA:  Hypoglycemia, nausea and vomiting. EXAM: CT ABDOMEN AND PELVIS WITHOUT CONTRAST TECHNIQUE: Multidetector CT imaging of the abdomen and pelvis was performed following the standard protocol without IV contrast. COMPARISON:  None. FINDINGS: Lower chest: Minimal patchy opacity in the right lower lobe. Normal sized heart. Hepatobiliary: Small number of tiny gallstones in the gallbladder. Poorly distended gallbladder with no unexpected wall thickening or pericholecystic fluid. Unremarkable liver. Pancreas: Unremarkable. No pancreatic ductal dilatation or surrounding inflammatory changes. Spleen: Normal in size without focal abnormality. Adrenals/Urinary Tract: Adrenal glands are unremarkable. Kidneys are normal, without renal calculi, focal lesion, or hydronephrosis. Bladder is unremarkable. Stomach/Bowel: Extensive colonic diverticulosis without evidence of diverticulitis. Normal appearing appendix. Small hiatal hernia. Unremarkable small bowel. Vascular/Lymphatic: Mild atheromatous arterial calcifications. No enlarged lymph nodes. Reproductive: Prostate is unremarkable. Other: 2 small supraumbilical hernias containing fat. Small to moderate-sized bilateral inguinal hernias containing fat, left larger than right. Musculoskeletal: Lumbosacral spine degenerative changes and  mild lower thoracic spine degenerative changes. IMPRESSION: 1. No acute abnormality. 2. Small hiatal hernia. 3. 2 small supraumbilical hernias containing fat and small to moderate-sized bilateral inguinal hernias containing fat. 4. Cholelithiasis. 5. Extensive colonic diverticulosis. Electronically Signed   By: Claudie Revering M.D.   On: 02/12/2021 22:00   DG Abd 1 View  Result Date: 02/12/2021 CLINICAL DATA:  Nausea, vomiting and abdominal pain. EXAM: ABDOMEN - 1 VIEW COMPARISON:  None. FINDINGS: The bowel gas pattern is normal. No radio-opaque calculi or other significant radiographic abnormality are seen. IMPRESSION: Negative exam. Electronically Signed   By: Inge Rise M.D.   On: 02/12/2021 11:14   US RENAL  Result Date: 02/12/2021 CLINICAL DATA:  Elevated creatinine. EXAM: RENAL / URINARY TRACT ULTRASOUND COMPLETE COMPARISON:  None. FINDINGS: Right Kidney: Renal measurements: 9.7 x 6.2 x 6.5 cm = volume: 244 mL. Echogenicity within normal limits. No mass or hydronephrosis visualized. Left Kidney: Renal measurements: 12.8 x 7.1 x 6.0 cm = volume: 285 mL. Echogenicity within normal limits. No mass or hydronephrosis visualized. Bladder: Appears normal for degree of bladder distention. Bilateral ureteral jets visualized. Other: Evaluation limited by patient respiratory motion. IMPRESSION: No hydronephrosis. Electronically Signed   By: Margaretha Sheffield MD   On: 02/12/2021 11:41     Labs:   Basic Metabolic Panel: Recent Labs  Lab 02/11/21 2200 02/12/21 0322 02/13/21 1107 02/14/21 0353 02/15/21 0311  NA 130* 126* 135 135 134*  K 4.8 4.9  5.3* 5.2* 5.8*  CL 103 100 107 106 108  CO2 17* 16* 18* 19* 18*  GLUCOSE 56* 126* 121* 112* 90  BUN 85* 83* 61* 53* 45*  CREATININE 4.39* 3.87* 2.79* 2.59* 2.74*  CALCIUM 8.4* 8.2* 9.1 9.4 9.3  MG  --  1.4* 2.0 1.7  --    GFR Estimated Creatinine Clearance: 38.7 mL/min (A) (by C-G formula based on SCr of 2.74 mg/dL (H)). Liver Function  Tests: Recent Labs  Lab 02/11/21 2200 02/12/21 0322  AST 35 32  ALT 14 15  ALKPHOS 62 61  BILITOT 0.1* 0.3  PROT 7.0 6.9  ALBUMIN 2.6* 2.6*   Recent Labs  Lab 02/11/21 2200  LIPASE 25   No results for input(s): AMMONIA in the last 168 hours. Coagulation profile No results for input(s): INR, PROTIME in the last 168 hours.  CBC: Recent Labs  Lab 02/11/21 1900 02/12/21 0322 02/13/21 1107 02/14/21 0353 02/15/21 0311  WBC 10.0 9.4 4.9 4.4 3.9*  NEUTROABS 8.7*  --   --   --   --   HGB 8.9* 7.8* 9.0* 9.0* 9.1*  HCT 28.7* 24.7* 28.0* 28.5* 29.5*  MCV 94.7 90.5 90.6 91.9 93.1  PLT 101* 92* 111* 115* 117*   Cardiac Enzymes: No results for input(s): CKTOTAL, CKMB, CKMBINDEX, TROPONINI in the last 168 hours. BNP: Invalid input(s): POCBNP CBG: Recent Labs  Lab 02/14/21 1206 02/14/21 1704 02/14/21 2152 02/15/21 0738 02/15/21 1136  GLUCAP 113* 101* 104* 88 96   D-Dimer No results for input(s): DDIMER in the last 72 hours. Hgb A1c No results for input(s): HGBA1C in the last 72 hours. Lipid Profile No results for input(s): CHOL, HDL, LDLCALC, TRIG, CHOLHDL, LDLDIRECT in the last 72 hours. Thyroid function studies No results for input(s): TSH, T4TOTAL, T3FREE, THYROIDAB in the last 72 hours.  Invalid input(s): FREET3 Anemia work up No results for input(s): VITAMINB12, FOLATE, FERRITIN, TIBC, IRON, RETICCTPCT in the last 72 hours. Microbiology Recent Results (from the past 240 hour(s))  Resp Panel by RT-PCR (Flu A&B, Covid) Nasopharyngeal Swab     Status: None   Collection Time: 02/11/21 11:08 PM   Specimen: Nasopharyngeal Swab; Nasopharyngeal(NP) swabs in vial transport medium  Result Value Ref Range Status   SARS Coronavirus 2 by RT PCR NEGATIVE NEGATIVE Final    Comment: (NOTE) SARS-CoV-2 target nucleic acids are NOT DETECTED.  The SARS-CoV-2 RNA is generally detectable in upper respiratory specimens during the acute phase of infection. The  lowest concentration of SARS-CoV-2 viral copies this assay can detect is 138 copies/mL. A negative result does not preclude SARS-Cov-2 infection and should not be used as the sole basis for treatment or other patient management decisions. A negative result may occur with  improper specimen collection/handling, submission of specimen other than nasopharyngeal swab, presence of viral mutation(s) within the areas targeted by this assay, and inadequate number of viral copies(<138 copies/mL). A negative result must be combined with clinical observations, patient history, and epidemiological information. The expected result is Negative.  Fact Sheet for Patients:  EntrepreneurPulse.com.au  Fact Sheet for Healthcare Providers:  IncredibleEmployment.be  This test is no t yet approved or cleared by the [place] FDA and  has been authorized for detection and/or diagnosis of SARS-CoV-2 by FDA under an Emergency Use Authorization (EUA). This EUA will remain  in effect (meaning this test can be used) for the duration of the COVID-19 declaration under Section 564(b)(1) of the Act, 21 U.S.C.section 360bbb-3(b)(1), unless the authorization is terminated  or revoked sooner.       Influenza A by PCR NEGATIVE NEGATIVE Final   Influenza B by PCR NEGATIVE NEGATIVE Final    Comment: (NOTE) The Xpert Xpress SARS-CoV-2/FLU/RSV plus assay is intended as an aid in the diagnosis of influenza from Nasopharyngeal swab specimens and should not be used as a sole basis for treatment. Nasal washings and aspirates are unacceptable for Xpert Xpress SARS-CoV-2/FLU/RSV testing.  Fact Sheet for Patients: EntrepreneurPulse.com.au  Fact Sheet for Healthcare Providers: IncredibleEmployment.be  This test is not yet approved or cleared by the [place] FDA and has been authorized for detection and/or diagnosis of SARS-CoV-2 by FDA under  an Emergency Use Authorization (EUA). This EUA will remain in effect (meaning this test can be used) for the duration of the COVID-19 declaration under Section 564(b)(1) of the Act, 21 U.S.C. section 360bbb-3(b)(1), unless the authorization is terminated or revoked.  Performed at [facility], [place] [address], [place], [place] [address]      Discharge Instructions:   Discharge Instructions     Diet - low sodium heart healthy   Complete by: As directed    Discharge instructions   Complete by: As directed    Follow-up with your primary care provider in 1 week.  Follow-up with infectious disease clinic as he has been scheduled.  You have been prescribed multiple new medications.  Please take as prescribed.  Radiology test has been ordered and radiology will get in touch with you to do that test as outpatient.   Increase activity slowly   Complete by: As directed       Allergies as of 02/15/2021   No Known Allergies      Medication List     STOP taking these medications    Diclofenac-miSOPROStol 75-0.2 MG Tbec       TAKE these medications    Accu-Chek Guide test strip Generic drug: glucose blood 2 (two) times daily.   Accu-Chek Softclix Lancets lancets 2 (two) times daily.   amLODipine 10 MG tablet Commonly known as: NORVASC Take 10 mg by mouth daily.   clonazePAM 0.5 MG tablet Commonly known as: KLONOPIN Take 0.5 mg by mouth 3 (three) times daily as needed.   cyanocobalamin 1000 MCG tablet Take 1 tablet (1,000 mcg total) by mouth daily. Start taking on: February 16, 2021   dapsone 100 MG tablet Take 1 tablet (100 mg total) by mouth daily. Start taking on: February 16, 2021   dolutegravir 50 MG tablet Commonly known as: TIVICAY Take 1 tablet (50 mg total) by mouth daily. Start taking on: February 16, 2021   fluconazole 100 MG tablet Commonly known as: DIFLUCAN Take 1 tablet (100 mg total) by mouth daily for 7 days.   folic acid 1 MG  tablet Commonly known as: FOLVITE Take 1 tablet (1 mg total) by mouth daily. Start taking on: February 16, 2021   gabapentin 600 MG tablet Commonly known as: NEURONTIN Take 600 mg by mouth 3 (three) times daily.   glipiZIDE 5 MG tablet Commonly known as: GLUCOTROL Take 5 mg by mouth 2 (two) times daily.   hydrochlorothiazide 25 MG tablet Commonly known as: HYDRODIURIL Take 25 mg by mouth daily. What changed: Another medication with the same name was removed. Continue taking this medication, and follow the directions you see here.   HYDROcodone-acetaminophen 5-325 MG tablet Commonly known as: NORCO/VICODIN Take 1 tablet by mouth 2 (two) times daily as needed.   hydrocortisone 2.5 % cream Apply  topically.   lamivudine 300 MG tablet Commonly known as: EPIVIR Take 1 tablet (300 mg total) by mouth daily. Start taking on: February 16, 2021   lisinopril 20 MG tablet Commonly known as: ZESTRIL Take 20 mg by mouth daily.   meloxicam 15 MG tablet Commonly known as: MOBIC TAKE 1 TABLET(15 MG) BY MOUTH DAILY   metFORMIN 500 MG tablet Commonly known as: GLUCOPHAGE Take by mouth 2 (two) times daily with a meal.   miconazole 2 % cream Commonly known as: MICOTIN Apply topically.   Mitigare 0.6 MG Caps Generic drug: Colchicine Take 1 capsule by mouth daily.   mometasone 0.1 % lotion Commonly known as: ELOCON Apply topically.   omeprazole 20 MG capsule Commonly known as: PRILOSEC Take 1 capsule (20 mg total) by mouth daily.   ondansetron 4 MG tablet Commonly known as: ZOFRAN Take 1 tablet (4 mg total) by mouth every 6 (six) hours as needed for nausea or vomiting.   sodium bicarbonate 650 MG tablet Take 1 tablet (650 mg total) by mouth 3 (three) times daily.   traMADol 50 MG tablet Commonly known as: ULTRAM Take by mouth every 6 (six) hours as needed.   zolpidem 5 MG tablet Commonly known as: AMBIEN Take 5 mg by mouth at bedtime as needed.        Follow-up Information      Elwyn Reach, MD. Schedule an appointment as soon as possible for a visit in 1 week(s).   Specialty: Internal Medicine Contact information: [id number] [place] [place] [address] [phone]         [place]en Circle, FNP. Go on 02/19/2021.   Specialties: Family Medicine, Infectious Diseases Why: 10:30 AM Contact information: [address] [place] [address] [phone]                  Time coordinating discharge: 39 minutes  Signed:  Harjas Biggins  [facility] 02/15/2021, 3:00 PM

## 2021-02-15 NOTE — Plan of Care (Addendum)
08:40  Pt was able to finish drinking the remaining bowel prep. RN reminded Pt that he is still on NPO for the procedure and he states understanding.  1214 Pt transported to Endo via wheelchair, alert and oriented x 4.   Problem: Clinical Measurements: Goal: Ability to maintain clinical measurements within normal limits will improve Outcome: Progressing   Problem: Activity: Goal: Risk for activity intolerance will decrease Outcome: Progressing   Problem: Nutrition: Goal: Adequate nutrition will be maintained Outcome: Progressing   Problem: Coping: Goal: Level of anxiety will decrease Outcome: Progressing   Problem: Elimination: Goal: Will not experience complications related to bowel motility Outcome: Progressing   Problem: Pain Managment: Goal: General experience of comfort will improve Outcome: Progressing   Problem: Safety: Goal: Ability to remain free from injury will improve Outcome: Progressing   Problem: Skin Integrity: Goal: Risk for impaired skin integrity will decrease Outcome: Progressing

## 2021-02-15 NOTE — Transfer of Care (Signed)
Immediate Anesthesia Transfer of Care Note  Patient: Bruce Hunter  Procedure(s) Performed: ESOPHAGOGASTRODUODENOSCOPY (EGD) WITH PROPOFOL COLONOSCOPY WITH PROPOFOL BIOPSY  Patient Location: PACU  Anesthesia Type:MAC  Level of Consciousness: awake, alert , oriented and patient cooperative  Airway & Oxygen Therapy: Patient Spontanous Breathing and Patient connected to face mask oxygen  Post-op Assessment: Report given to RN, Post -op Vital signs reviewed and stable and Patient moving all extremities X 4  Post vital signs: stable  Last Vitals:  Vitals Value Taken Time  BP 133/97 02/15/21 1357  Temp    Pulse 113 02/15/21 1358  Resp 21 02/15/21 1359  SpO2 78 % 02/15/21 1358  Vitals shown include unvalidated device data.  Last Pain:  Vitals:   02/15/21 1228  TempSrc: Oral  PainSc: 0-No pain      Patients Stated Pain Goal: 0 (03/49/17 9150)  Complications: No notable events documented.

## 2021-02-16 ENCOUNTER — Telehealth: Payer: Self-pay

## 2021-02-16 LAB — QUANTIFERON-TB GOLD PLUS (RQFGPL)
QuantiFERON Mitogen Value: 0.34 IU/mL
QuantiFERON Nil Value: 0.06 IU/mL
QuantiFERON TB1 Ag Value: 0.07 IU/mL
QuantiFERON TB2 Ag Value: 0.07 IU/mL

## 2021-02-16 LAB — QUANTIFERON-TB GOLD PLUS: QuantiFERON-TB Gold Plus: UNDETERMINED — AB

## 2021-02-16 LAB — CD4/CD8 (T-HELPER/T-SUPPRESSOR CELL)
CD4 absolute: <35 /uL — ABNORMAL LOW (ref 400–1790)
CD4%: 5.76 % — ABNORMAL LOW (ref 33–65)
CD8 T Cell Abs: 221 /uL (ref 190–1000)
CD8tox: 41.12 % — ABNORMAL HIGH (ref 12–40)
Ratio: 0.14 — ABNORMAL LOW (ref 1.0–3.0)
Total lymphocyte count: 538 /uL — ABNORMAL LOW (ref 1000–4000)

## 2021-02-16 NOTE — Telephone Encounter (Signed)
-----   Message from Truman Hayward, MD sent at 02/16/2021  2:58 PM EDT ----- Regarding: RE: He should have Dovato and Dapsone and fluconazole ----- Message ----- From: Carlean Purl, RN Sent: 02/16/2021   2:52 PM EDT To: Truman Hayward, MD, Carlyle Basques, MD, # Subject: RE:                                            I'll call him to follow up and make sure he 1) has his medications 2) knows he has an appointment with Marya Amsler on the 17th. Anything in particular I need to review? Thanks! ----- Message ----- From: Tommy Medal, Lavell Islam, MD Sent: 02/16/2021   2:37 PM EDT To: Carlyle Basques, MD, # Subject: RE:                                            That's crap esp all time I spent w him and talking to his Mom. Maybe triage Francina Ames can investigate i told him and his Mom day and time appt and I told him he was to leave w Dovato I guess hospitalist switcuwd service and ignore my advice ----- Message ----- From: Carlyle Basques, MD Sent: 02/16/2021  11:39 AM EDT To: Truman Hayward, MD, Golden Circle, FNP  So I am so mad that the hospitalist discharged him yesterday. I don't know if he got instructions on his meds. I left voicemail. He has appt listed on his discharge summary so I hope he shows up.

## 2021-02-16 NOTE — Telephone Encounter (Signed)
Called patient to review medications, discuss upcoming appointment and address any questions he may have. Patient was slurring words and stated he was trying to take a nap because his feet hurt. RN continued to speak with him in hopes he'd wake up more but patient did not. Unsure if he was medicated or extremely fatigued. Patient stated he was not aware of appointment on the 17th and did not have access to his MyChart. Patient did state he has taken all of his medications but couldn't say which ones.   RN will follow up in the morning to discuss medications and appointment. Patient aware to expect call.  [name], RN

## 2021-02-17 ENCOUNTER — Encounter (HOSPITAL_COMMUNITY): Payer: Self-pay | Admitting: Gastroenterology

## 2021-02-17 ENCOUNTER — Telehealth: Payer: Self-pay | Admitting: *Deleted

## 2021-02-17 LAB — SURGICAL PATHOLOGY

## 2021-02-17 NOTE — Telephone Encounter (Signed)
RN spoke with patient, he is not home to discuss his medications. Requested that he please bring his medications with him to his appointment on 02/19/21. Reset patient's MyChart password so that he can access his account, will send him office address and list of medications he should be taking.   Beryle Flock, RN

## 2021-02-17 NOTE — Telephone Encounter (Signed)
Patient calling to request his upcoming appointment and time. Returned call and gave information to patient, verbalized understanding.

## 2021-02-17 NOTE — Telephone Encounter (Signed)
RN spoke with patient, reminded him of upcoming appointment on 02/19/21. He requested that we please call him tomorrow to remind him again.   Attempted to confirm medications with the patient, he states he's not home right now and he would need to look at the bottles to know what he's taking. He asked that we call back sometime this afternoon when he will be home.   Asked patient how he was feeling as one of the nurses spoke to patient yesterday and he seemed very fatigued. Patient states he was tired yesterday and his feet hurt. During this phone call patient's speech was intelligible.   Will need to call patient back this afternoon.   Beryle Flock, RN

## 2021-02-18 ENCOUNTER — Telehealth: Payer: Self-pay

## 2021-02-18 ENCOUNTER — Other Ambulatory Visit (HOSPITAL_COMMUNITY): Payer: Self-pay

## 2021-02-18 LAB — HLA B*5701: HLA B 5701: NEGATIVE

## 2021-02-18 NOTE — Telephone Encounter (Signed)
[facility] Patient Teacher, English as a foreign language completed.    The patient is insured through [facility] [employer]  and has a 3.00 copay.  Script was last filled on 02/15/2021 NF 03/10/21   We will continue to follow to see if copay assistance is needed.  Ileene Patrick, [facility] Patient [facility] for Infectious Disease Phone: [phone] Fax:  [phone]

## 2021-02-18 NOTE — Anesthesia Postprocedure Evaluation (Signed)
Anesthesia Post Note  Patient: Bruce Hunter  Procedure(s) Performed: ESOPHAGOGASTRODUODENOSCOPY (EGD) WITH PROPOFOL COLONOSCOPY WITH PROPOFOL BIOPSY     Patient location during evaluation: Endoscopy Anesthesia Type: MAC Level of consciousness: awake Pain management: pain level controlled Vital Signs Assessment: post-procedure vital signs reviewed and stable Respiratory status: spontaneous breathing Cardiovascular status: stable Postop Assessment: no apparent nausea or vomiting Anesthetic complications: no   No notable events documented.  Last Vitals:  Vitals:   02/15/21 1411 02/15/21 1436  BP: 135/90 (!) 129/93  Pulse:  96  Resp: (!) 22 19  Temp:  37.1 C  SpO2:  93%    Last Pain:  Vitals:   02/15/21 1436  TempSrc: Oral  PainSc: 0-No pain                 Huston Foley

## 2021-02-19 ENCOUNTER — Ambulatory Visit (INDEPENDENT_AMBULATORY_CARE_PROVIDER_SITE_OTHER): Payer: Medicaid Other | Admitting: Family

## 2021-02-19 ENCOUNTER — Encounter: Payer: Self-pay | Admitting: Family

## 2021-02-19 ENCOUNTER — Other Ambulatory Visit: Payer: Self-pay

## 2021-02-19 VITALS — BP 104/68 | HR 120 | Temp 98.4°F | Wt 197.0 lb

## 2021-02-19 DIAGNOSIS — B3781 Candidal esophagitis: Secondary | ICD-10-CM

## 2021-02-19 DIAGNOSIS — Z79899 Other long term (current) drug therapy: Secondary | ICD-10-CM | POA: Diagnosis not present

## 2021-02-19 DIAGNOSIS — M792 Neuralgia and neuritis, unspecified: Secondary | ICD-10-CM | POA: Diagnosis not present

## 2021-02-19 DIAGNOSIS — B20 Human immunodeficiency virus [HIV] disease: Secondary | ICD-10-CM | POA: Diagnosis not present

## 2021-02-19 NOTE — Patient Instructions (Signed)
Nice to see you.  STOP taking the Tivicay and Lamivudine and take the DOVATO samples that were provided.  CONTINUE to take DAPSONE as prescribed.  Plan for follow up in 1 month or sooner if needed.  Have a great day!

## 2021-02-19 NOTE — Assessment & Plan Note (Signed)
Diagnosed through EGD and continues to take his fluconazole with improvements in his symptoms. Discussed importance of completing medication as prescribed.

## 2021-02-19 NOTE — Progress Notes (Signed)
Brief Narrative   Patient ID: Bruce Hunter, male    DOB: [birth year], 45 y.o.   MRN: [id number]  Bruce Hunter is a 45 y/o AA male with HIV-1 disease now with AIDS with risk factor of heterosexual contact. CD4 count is <35 and initial viral load of 2.7 million entering care at Stage 3. Genotype remains pending. Treated for candida esophagitis on entry to care. [id number] negative.  Initially started on Dovato and Dapsone.   Subjective:    Chief Complaint  Patient presents with   HIV Positive/AIDS      HPI:  Bruce Hunter is a 45 y.o. male with previous medical history of obesity, hypertension, hyperlipidemia, gout, type 2 diabetes, and asthma who was recently admitted to the hospital with complaints of hypoglycemia and blood sugar of 62 and subsequently found to have a positive HIV test.  Here today for hospitalization follow-up and to establish care for HIV disease.  Bruce Hunter is newly diagnosed with HIV through blood work while hospitalized.  Risk factor for HIV is heterosexual contact.  Hospital blood work confirms HIV 1 positive antibody with initial viral load of 2.7 million and CD4 count of less than 35.  Genotype remains pending.  EGD was performed with no significant findings with the exception of Candida esophagitis for which he was started on fluconazole.  ART started in the hospital with dolutegravir and lamivudine.  Bruce Hunter has been taking his dolutegravir and ranitidine that was prescribed in the hospital in addition to dapsone for OI prophylaxis given CD4 count less than 35.  Has several questions about HIV.  Denies history of IV drug use and currently uses marijuana.  He is taking the fluconazole as prescribed and his throat is feeling better and his appetite has improved.  Denies fevers, chills, night sweats, headaches, changes in vision, neck pain/stiffness, nausea, diarrhea, vomiting, lesions or rashes.  Continues to have pain in his feet related to neuropathy which has  been ongoing and smokes marijuana to help control his pain.   No Known Allergies    Outpatient Medications Prior to Visit  Medication Sig Dispense Refill   ACCU-CHEK GUIDE test strip 2 (two) times daily.     Accu-Chek Softclix Lancets lancets 2 (two) times daily.     amLODipine (NORVASC) 10 MG tablet Take 10 mg by mouth daily.     clonazePAM (KLONOPIN) 0.5 MG tablet Take 0.5 mg by mouth 3 (three) times daily as needed.     dapsone 100 MG tablet Take 1 tablet (100 mg total) by mouth daily. 30 tablet 3   dolutegravir (TIVICAY) 50 MG tablet Take 1 tablet (50 mg total) by mouth daily. 30 tablet 2   fluconazole (DIFLUCAN) 100 MG tablet Take 1 tablet (100 mg total) by mouth daily for 7 Hunter. 14 tablet 0   folic acid (FOLVITE) 1 MG tablet Take 1 tablet (1 mg total) by mouth daily. 100 tablet 2   gabapentin (NEURONTIN) 600 MG tablet Take 600 mg by mouth 3 (three) times daily.     glipiZIDE (GLUCOTROL) 5 MG tablet Take 5 mg by mouth 2 (two) times daily.     hydrochlorothiazide (HYDRODIURIL) 25 MG tablet Take 25 mg by mouth daily.     HYDROcodone-acetaminophen (NORCO/VICODIN) 5-325 MG tablet Take 1 tablet by mouth 2 (two) times daily as needed.     hydrocortisone 2.5 % cream Apply topically.     lamiVUDine (EPIVIR) 300 MG tablet Take 1 tablet (300 mg total) by  mouth daily. 30 tablet 2   lisinopril (ZESTRIL) 20 MG tablet Take 20 mg by mouth daily.     meloxicam (MOBIC) 15 MG tablet TAKE 1 TABLET(15 MG) BY MOUTH DAILY 30 tablet 0   metFORMIN (GLUCOPHAGE) 500 MG tablet Take by mouth 2 (two) times daily with a meal.     miconazole (MICOTIN) 2 % cream Apply topically.     MITIGARE 0.6 MG CAPS Take 1 capsule by mouth daily.     mometasone (ELOCON) 0.1 % lotion Apply topically.     omeprazole (PRILOSEC) 20 MG capsule Take 1 capsule (20 mg total) by mouth daily. 30 capsule 2   ondansetron (ZOFRAN) 4 MG tablet Take 1 tablet (4 mg total) by mouth every 6 (six) hours as needed for nausea or vomiting. 20  tablet 0   sodium bicarbonate 650 MG tablet Take 1 tablet (650 mg total) by mouth 3 (three) times daily. 90 tablet 2   traMADol (ULTRAM) 50 MG tablet Take by mouth every 6 (six) hours as needed.     vitamin B-12 1000 MCG tablet Take 1 tablet (1,000 mcg total) by mouth daily. 100 tablet 2   zolpidem (AMBIEN) 5 MG tablet Take 5 mg by mouth at bedtime as needed.     No facility-administered medications prior to visit.     Past Medical History:  Diagnosis Date   Asthma    Diabetes mellitus without complication ([place])    Gout    Hyperlipidemia    Hypertension    Obesity      Past Surgical History:  Procedure Laterality Date   BIOPSY  02/15/2021   Procedure: BIOPSY;  Surgeon: Ronnette Juniper, MD;  Location: [facility];  Service: Gastroenterology;;   COLONOSCOPY WITH PROPOFOL N/A 02/15/2021   Procedure: COLONOSCOPY WITH PROPOFOL;  Surgeon: Ronnette Juniper, MD;  Location: [facility];  Service: Gastroenterology;  Laterality: N/A;   ESOPHAGOGASTRODUODENOSCOPY (EGD) WITH PROPOFOL N/A 02/15/2021   Procedure: ESOPHAGOGASTRODUODENOSCOPY (EGD) WITH PROPOFOL;  Surgeon: Ronnette Juniper, MD;  Location: [facility];  Service: Gastroenterology;  Laterality: N/A;   GANGLION CYST EXCISION Right 02/26/2020   Procedure: EXCISION TOPHUS RIGHT FOOT;  Surgeon: Evelina Bucy, DPM;  Location: [facility];  Service: Podiatry;  Laterality: Right;   NO PAST SURGERIES         Review of Systems  Constitutional:  Negative for appetite change, chills, fatigue, fever and unexpected weight change.  Eyes:  Negative for visual disturbance.  Respiratory:  Negative for cough, chest tightness, shortness of breath and wheezing.   Cardiovascular:  Negative for chest pain and leg swelling.  Gastrointestinal:  Negative for abdominal pain, constipation, diarrhea, nausea and vomiting.  Genitourinary:  Negative for dysuria, flank pain, frequency, genital sores, hematuria and urgency.  Skin:  Negative for rash.   Allergic/Immunologic: Negative for immunocompromised state.  Neurological:  Negative for dizziness and headaches.       Positive for bilateral neuropathic foot pain     Objective:    BP 104/68   Pulse (!) 120   Temp 98.4 F (36.9 C)   Wt 197 lb (89.4 kg)   SpO2 97%   BMI 29.09 kg/m  Nursing note and vital signs reviewed.  Physical Exam Constitutional:      General: He is not in acute distress.    Appearance: He is well-developed.     Comments: Seated in the chair; lethargic and drowsy at times; strong smell of marijuana.   Eyes:     Conjunctiva/sclera: Conjunctivae  normal.  Cardiovascular:     Rate and Rhythm: Normal rate and regular rhythm.     Heart sounds: Normal heart sounds. No murmur heard.   No friction rub. No gallop.  Pulmonary:     Effort: Pulmonary effort is normal. No respiratory distress.     Breath sounds: Normal breath sounds. No wheezing or rales.  Chest:     Chest wall: No tenderness.  Abdominal:     General: Bowel sounds are normal.     Palpations: Abdomen is soft.     Tenderness: There is no abdominal tenderness.  Musculoskeletal:     Cervical back: Neck supple.  Lymphadenopathy:     Cervical: No cervical adenopathy.  Skin:    General: Skin is warm and dry.     Findings: No rash.     Depression screen PHQ 2/9 02/19/2021  Decreased Interest 0  Down, Depressed, Hopeless 0  PHQ - 2 Score 0       Assessment & Plan:    Patient Active Problem List   Diagnosis Date Noted   Candida esophagitis ([place]) 02/19/2021   Neuropathic pain 02/19/2021   Polypharmacy 02/19/2021   Cough    N&V (nausea and vomiting)    AIDS (acquired immune deficiency syndrome) ([facility])    Esophageal dysphagia    Gastroesophageal reflux disease with esophagitis without hemorrhage    Gastrointestinal hemorrhage    Abdominal pain    Creatinine elevation    Hypoglycemia 02/12/2021   HTN (hypertension) 02/12/2021   Anemia 02/12/2021   AKI (acute kidney injury) ([place])  02/11/2021     Problem List Items Addressed This Visit       Digestive   Candida esophagitis ([place])    Diagnosed through EGD and continues to take his fluconazole with improvements in his symptoms. Discussed importance of completing medication as prescribed.          Other   AIDS (acquired immune deficiency syndrome) [facility])    Bruce Hunter is a 45 y/o AA male with newly diagnosed HIV-1 disease now with AIDS with risk factor of heterosexual contact. CD4 count is <35 and initial viral load of 2.7 million entering care at Stage 3. Genotype remains pending. Discussed the basic information about HIV and treatment. Not sure how much he is understanding as he appears to be very high. Condensed his dolutegravir and lamivudine to Dovato with samples provided and logged in the pharmacy records. Will need to continue dapsone for OI prophylaxis. Plan for follow up in 1 month or sooner if needed.        Neuropathic pain    Bruce Hunter continues to have neuropathic foot pain that is not resolved by gabapentin and continues to smoke marijuana. Source is likely multifactorial with HIV neuropathy and diabetes likely contributing factors. Continue gabapentin with pain medication adjustments per primary care.        Polypharmacy    Bruce Hunter brought in all of his medications today and all were uncovered and in a plastic container in their respective bottles. He does not appear to have a clear understanding of why he is taking several medications. I am not sure if this is related to his current substance use or underlying lack of medical education. Will continue to wean off and narrow medications as able.          I am having Jeet L. Hettel maintain his amLODipine, metFORMIN, traMADol, gabapentin, meloxicam, clonazePAM, Mitigare, glipiZIDE, Accu-Chek Guide, HYDROcodone-acetaminophen, hydrocortisone, Accu-Chek Softclix Lancets, lisinopril, miconazole, mometasone,  zolpidem, hydrochlorothiazide,  cyanocobalamin, sodium bicarbonate, lamivudine, folic acid, fluconazole, dolutegravir, dapsone, omeprazole, and ondansetron.   Follow-up: Return in about 1 month (around 03/21/2021).   Terri Piedra, MSN, FNP-C Nurse Practitioner [facility] [place] number: [phone]

## 2021-02-19 NOTE — Assessment & Plan Note (Signed)
Mr. Carnevale continues to have neuropathic foot pain that is not resolved by gabapentin and continues to smoke marijuana. Source is likely multifactorial with HIV neuropathy and diabetes likely contributing factors. Continue gabapentin with pain medication adjustments per primary care.

## 2021-02-19 NOTE — Assessment & Plan Note (Addendum)
Bruce Hunter is a 45 y/o AA male with newly diagnosed HIV-1 disease now with AIDS with risk factor of heterosexual contact. CD4 count is <35 and initial viral load of 2.7 million entering care at Stage 3. Genotype remains pending. Discussed the basic information about HIV and treatment. Not sure how much he is understanding as he appears to be very high. Condensed his dolutegravir and lamivudine to Dovato with samples provided and logged in the pharmacy records. Will need to continue dapsone for OI prophylaxis. Plan for follow up in 1 month or sooner if needed.

## 2021-02-19 NOTE — Assessment & Plan Note (Signed)
Bruce Hunter brought in all of his medications today and all were uncovered and in a plastic container in their respective bottles. He does not appear to have a clear understanding of why he is taking several medications. I am not sure if this is related to his current substance use or underlying lack of medical education. Will continue to wean off and narrow medications as able.

## 2021-03-01 LAB — SULFONYLUREA HYPOGLYCEMICS PANEL, SERUM
Acetohexamide: NEGATIVE ug/mL (ref 20–60)
Chlorpropamide: NEGATIVE ug/mL (ref 75–250)
Glimepiride: NEGATIVE ng/mL (ref 80–250)
Glipizide: NEGATIVE ng/mL (ref 200–1000)
Glyburide: NEGATIVE ng/mL
Nateglinide: NEGATIVE ng/mL
Repaglinide: NEGATIVE ng/mL
Tolazamide: NEGATIVE ug/mL
Tolbutamide: NEGATIVE ug/mL (ref 40–100)

## 2021-03-02 ENCOUNTER — Ambulatory Visit: Payer: Medicaid Other | Admitting: Podiatry

## 2021-03-03 LAB — HIV GENOSURE(R) MG

## 2021-03-03 LAB — HIV-1 RNA, PCR (GRAPH) RFX/GENO EDI
HIV-1 RNA BY PCR: 1470000 copies/mL
HIV-1 RNA Quant, Log: 6.167 log10copy/mL

## 2021-03-03 LAB — REFLEX TO GENOSURE(R) MG EDI: HIV GenoSure(R): 1

## 2021-03-04 ENCOUNTER — Other Ambulatory Visit: Payer: Self-pay

## 2021-03-04 ENCOUNTER — Emergency Department (HOSPITAL_COMMUNITY): Payer: Medicaid Other

## 2021-03-04 ENCOUNTER — Inpatient Hospital Stay (HOSPITAL_COMMUNITY)
Admission: EM | Admit: 2021-03-04 | Discharge: 2021-03-30 | DRG: 004 | Disposition: A | Discharge status: Rehabilitation Facility | Payer: Medicaid Other | Attending: Family Medicine | Admitting: Family Medicine

## 2021-03-04 ENCOUNTER — Encounter (HOSPITAL_COMMUNITY): Payer: Self-pay | Admitting: Oncology

## 2021-03-04 DIAGNOSIS — J9601 Acute respiratory failure with hypoxia: Secondary | ICD-10-CM

## 2021-03-04 DIAGNOSIS — T380X5A Adverse effect of glucocorticoids and synthetic analogues, initial encounter: Secondary | ICD-10-CM | POA: Diagnosis not present

## 2021-03-04 DIAGNOSIS — M1A9XX1 Chronic gout, unspecified, with tophus (tophi): Secondary | ICD-10-CM | POA: Diagnosis present

## 2021-03-04 DIAGNOSIS — D539 Nutritional anemia, unspecified: Secondary | ICD-10-CM | POA: Diagnosis present

## 2021-03-04 DIAGNOSIS — I130 Hypertensive heart and chronic kidney disease with heart failure and stage 1 through stage 4 chronic kidney disease, or unspecified chronic kidney disease: Secondary | ICD-10-CM | POA: Diagnosis present

## 2021-03-04 DIAGNOSIS — D696 Thrombocytopenia, unspecified: Secondary | ICD-10-CM | POA: Diagnosis present

## 2021-03-04 DIAGNOSIS — R0902 Hypoxemia: Secondary | ICD-10-CM | POA: Diagnosis not present

## 2021-03-04 DIAGNOSIS — G9341 Metabolic encephalopathy: Secondary | ICD-10-CM

## 2021-03-04 DIAGNOSIS — J9503 Malfunction of tracheostomy stoma: Secondary | ICD-10-CM | POA: Diagnosis not present

## 2021-03-04 DIAGNOSIS — E785 Hyperlipidemia, unspecified: Secondary | ICD-10-CM | POA: Diagnosis present

## 2021-03-04 DIAGNOSIS — D72829 Elevated white blood cell count, unspecified: Secondary | ICD-10-CM | POA: Diagnosis not present

## 2021-03-04 DIAGNOSIS — Z20822 Contact with and (suspected) exposure to covid-19: Secondary | ICD-10-CM | POA: Diagnosis present

## 2021-03-04 DIAGNOSIS — N1832 Chronic kidney disease, stage 3b: Secondary | ICD-10-CM | POA: Diagnosis not present

## 2021-03-04 DIAGNOSIS — M25512 Pain in left shoulder: Secondary | ICD-10-CM | POA: Diagnosis not present

## 2021-03-04 DIAGNOSIS — R5381 Other malaise: Secondary | ICD-10-CM | POA: Diagnosis not present

## 2021-03-04 DIAGNOSIS — E1142 Type 2 diabetes mellitus with diabetic polyneuropathy: Secondary | ICD-10-CM

## 2021-03-04 DIAGNOSIS — Z833 Family history of diabetes mellitus: Secondary | ICD-10-CM

## 2021-03-04 DIAGNOSIS — A4101 Sepsis due to Methicillin susceptible Staphylococcus aureus: Principal | ICD-10-CM | POA: Diagnosis present

## 2021-03-04 DIAGNOSIS — E1122 Type 2 diabetes mellitus with diabetic chronic kidney disease: Secondary | ICD-10-CM | POA: Diagnosis present

## 2021-03-04 DIAGNOSIS — L8990 Pressure ulcer of unspecified site, unspecified stage: Secondary | ICD-10-CM | POA: Insufficient documentation

## 2021-03-04 DIAGNOSIS — Z930 Tracheostomy status: Secondary | ICD-10-CM

## 2021-03-04 DIAGNOSIS — Z01818 Encounter for other preprocedural examination: Secondary | ICD-10-CM

## 2021-03-04 DIAGNOSIS — Z9911 Dependence on respirator [ventilator] status: Secondary | ICD-10-CM

## 2021-03-04 DIAGNOSIS — E871 Hypo-osmolality and hyponatremia: Secondary | ICD-10-CM | POA: Diagnosis not present

## 2021-03-04 DIAGNOSIS — E8809 Other disorders of plasma-protein metabolism, not elsewhere classified: Secondary | ICD-10-CM | POA: Diagnosis present

## 2021-03-04 DIAGNOSIS — I5033 Acute on chronic diastolic (congestive) heart failure: Secondary | ICD-10-CM | POA: Diagnosis present

## 2021-03-04 DIAGNOSIS — M154 Erosive (osteo)arthritis: Secondary | ICD-10-CM | POA: Diagnosis present

## 2021-03-04 DIAGNOSIS — Y838 Other surgical procedures as the cause of abnormal reaction of the patient, or of later complication, without mention of misadventure at the time of the procedure: Secondary | ICD-10-CM | POA: Diagnosis not present

## 2021-03-04 DIAGNOSIS — R4182 Altered mental status, unspecified: Secondary | ICD-10-CM | POA: Diagnosis not present

## 2021-03-04 DIAGNOSIS — E875 Hyperkalemia: Secondary | ICD-10-CM

## 2021-03-04 DIAGNOSIS — M609 Myositis, unspecified: Secondary | ICD-10-CM | POA: Diagnosis not present

## 2021-03-04 DIAGNOSIS — Z9114 Patient's other noncompliance with medication regimen: Secondary | ICD-10-CM

## 2021-03-04 DIAGNOSIS — M25572 Pain in left ankle and joints of left foot: Secondary | ICD-10-CM | POA: Diagnosis not present

## 2021-03-04 DIAGNOSIS — D638 Anemia in other chronic diseases classified elsewhere: Secondary | ICD-10-CM | POA: Diagnosis present

## 2021-03-04 DIAGNOSIS — B59 Pneumocystosis: Secondary | ICD-10-CM | POA: Diagnosis present

## 2021-03-04 DIAGNOSIS — B3781 Candidal esophagitis: Secondary | ICD-10-CM | POA: Diagnosis present

## 2021-03-04 DIAGNOSIS — B20 Human immunodeficiency virus [HIV] disease: Secondary | ICD-10-CM

## 2021-03-04 DIAGNOSIS — Z6828 Body mass index (BMI) 28.0-28.9, adult: Secondary | ICD-10-CM

## 2021-03-04 DIAGNOSIS — E1165 Type 2 diabetes mellitus with hyperglycemia: Secondary | ICD-10-CM | POA: Diagnosis not present

## 2021-03-04 DIAGNOSIS — Z4659 Encounter for fitting and adjustment of other gastrointestinal appliance and device: Secondary | ICD-10-CM

## 2021-03-04 DIAGNOSIS — J95851 Ventilator associated pneumonia: Secondary | ICD-10-CM | POA: Diagnosis not present

## 2021-03-04 DIAGNOSIS — Z79899 Other long term (current) drug therapy: Secondary | ICD-10-CM

## 2021-03-04 DIAGNOSIS — G8929 Other chronic pain: Secondary | ICD-10-CM | POA: Diagnosis present

## 2021-03-04 DIAGNOSIS — M109 Gout, unspecified: Secondary | ICD-10-CM | POA: Diagnosis not present

## 2021-03-04 DIAGNOSIS — Y848 Other medical procedures as the cause of abnormal reaction of the patient, or of later complication, without mention of misadventure at the time of the procedure: Secondary | ICD-10-CM | POA: Diagnosis not present

## 2021-03-04 DIAGNOSIS — J188 Other pneumonia, unspecified organism: Secondary | ICD-10-CM

## 2021-03-04 DIAGNOSIS — F1610 Hallucinogen abuse, uncomplicated: Secondary | ICD-10-CM | POA: Diagnosis not present

## 2021-03-04 DIAGNOSIS — N179 Acute kidney failure, unspecified: Secondary | ICD-10-CM

## 2021-03-04 DIAGNOSIS — G7281 Critical illness myopathy: Secondary | ICD-10-CM | POA: Diagnosis not present

## 2021-03-04 DIAGNOSIS — R6521 Severe sepsis with septic shock: Secondary | ICD-10-CM | POA: Diagnosis present

## 2021-03-04 DIAGNOSIS — M25571 Pain in right ankle and joints of right foot: Secondary | ICD-10-CM | POA: Diagnosis not present

## 2021-03-04 DIAGNOSIS — R6510 Systemic inflammatory response syndrome (SIRS) of non-infectious origin without acute organ dysfunction: Secondary | ICD-10-CM | POA: Diagnosis not present

## 2021-03-04 DIAGNOSIS — I82612 Acute embolism and thrombosis of superficial veins of left upper extremity: Secondary | ICD-10-CM | POA: Diagnosis present

## 2021-03-04 DIAGNOSIS — Z452 Encounter for adjustment and management of vascular access device: Secondary | ICD-10-CM

## 2021-03-04 DIAGNOSIS — Z978 Presence of other specified devices: Secondary | ICD-10-CM

## 2021-03-04 DIAGNOSIS — A419 Sepsis, unspecified organism: Secondary | ICD-10-CM | POA: Diagnosis not present

## 2021-03-04 DIAGNOSIS — J9600 Acute respiratory failure, unspecified whether with hypoxia or hypercapnia: Secondary | ICD-10-CM

## 2021-03-04 DIAGNOSIS — N170 Acute kidney failure with tubular necrosis: Secondary | ICD-10-CM | POA: Diagnosis present

## 2021-03-04 DIAGNOSIS — R6520 Severe sepsis without septic shock: Secondary | ICD-10-CM | POA: Diagnosis not present

## 2021-03-04 DIAGNOSIS — M792 Neuralgia and neuritis, unspecified: Secondary | ICD-10-CM | POA: Diagnosis not present

## 2021-03-04 DIAGNOSIS — E878 Other disorders of electrolyte and fluid balance, not elsewhere classified: Secondary | ICD-10-CM | POA: Diagnosis not present

## 2021-03-04 DIAGNOSIS — F419 Anxiety disorder, unspecified: Secondary | ICD-10-CM | POA: Diagnosis present

## 2021-03-04 DIAGNOSIS — N1831 Chronic kidney disease, stage 3a: Secondary | ICD-10-CM | POA: Diagnosis present

## 2021-03-04 DIAGNOSIS — F17210 Nicotine dependence, cigarettes, uncomplicated: Secondary | ICD-10-CM | POA: Diagnosis present

## 2021-03-04 DIAGNOSIS — Z791 Long term (current) use of non-steroidal anti-inflammatories (NSAID): Secondary | ICD-10-CM

## 2021-03-04 DIAGNOSIS — D631 Anemia in chronic kidney disease: Secondary | ICD-10-CM | POA: Diagnosis present

## 2021-03-04 DIAGNOSIS — M79601 Pain in right arm: Secondary | ICD-10-CM | POA: Diagnosis not present

## 2021-03-04 DIAGNOSIS — J9602 Acute respiratory failure with hypercapnia: Secondary | ICD-10-CM | POA: Diagnosis not present

## 2021-03-04 DIAGNOSIS — J189 Pneumonia, unspecified organism: Principal | ICD-10-CM

## 2021-03-04 DIAGNOSIS — R7989 Other specified abnormal findings of blood chemistry: Secondary | ICD-10-CM

## 2021-03-04 DIAGNOSIS — E1140 Type 2 diabetes mellitus with diabetic neuropathy, unspecified: Secondary | ICD-10-CM | POA: Diagnosis present

## 2021-03-04 DIAGNOSIS — J15211 Pneumonia due to Methicillin susceptible Staphylococcus aureus: Secondary | ICD-10-CM | POA: Diagnosis not present

## 2021-03-04 DIAGNOSIS — Z0189 Encounter for other specified special examinations: Secondary | ICD-10-CM

## 2021-03-04 DIAGNOSIS — E669 Obesity, unspecified: Secondary | ICD-10-CM | POA: Diagnosis present

## 2021-03-04 DIAGNOSIS — M7989 Other specified soft tissue disorders: Secondary | ICD-10-CM | POA: Diagnosis not present

## 2021-03-04 DIAGNOSIS — J80 Acute respiratory distress syndrome: Secondary | ICD-10-CM | POA: Diagnosis present

## 2021-03-04 DIAGNOSIS — Z7984 Long term (current) use of oral hypoglycemic drugs: Secondary | ICD-10-CM

## 2021-03-04 DIAGNOSIS — R410 Disorientation, unspecified: Secondary | ICD-10-CM | POA: Diagnosis not present

## 2021-03-04 DIAGNOSIS — K219 Gastro-esophageal reflux disease without esophagitis: Secondary | ICD-10-CM | POA: Diagnosis present

## 2021-03-04 DIAGNOSIS — L89312 Pressure ulcer of right buttock, stage 2: Secondary | ICD-10-CM | POA: Diagnosis present

## 2021-03-04 DIAGNOSIS — E870 Hyperosmolality and hypernatremia: Secondary | ICD-10-CM | POA: Diagnosis present

## 2021-03-04 DIAGNOSIS — L89152 Pressure ulcer of sacral region, stage 2: Secondary | ICD-10-CM | POA: Diagnosis present

## 2021-03-04 DIAGNOSIS — T17990A Other foreign object in respiratory tract, part unspecified in causing asphyxiation, initial encounter: Secondary | ICD-10-CM | POA: Diagnosis not present

## 2021-03-04 DIAGNOSIS — K760 Fatty (change of) liver, not elsewhere classified: Secondary | ICD-10-CM | POA: Diagnosis present

## 2021-03-04 DIAGNOSIS — D893 Immune reconstitution syndrome: Secondary | ICD-10-CM | POA: Diagnosis not present

## 2021-03-04 DIAGNOSIS — R34 Anuria and oliguria: Secondary | ICD-10-CM | POA: Diagnosis present

## 2021-03-04 DIAGNOSIS — E11649 Type 2 diabetes mellitus with hypoglycemia without coma: Secondary | ICD-10-CM | POA: Diagnosis present

## 2021-03-04 DIAGNOSIS — R339 Retention of urine, unspecified: Secondary | ICD-10-CM | POA: Diagnosis not present

## 2021-03-04 DIAGNOSIS — Z781 Physical restraint status: Secondary | ICD-10-CM

## 2021-03-04 DIAGNOSIS — E876 Hypokalemia: Secondary | ICD-10-CM | POA: Diagnosis not present

## 2021-03-04 DIAGNOSIS — F1190 Opioid use, unspecified, uncomplicated: Secondary | ICD-10-CM | POA: Diagnosis present

## 2021-03-04 DIAGNOSIS — F05 Delirium due to known physiological condition: Secondary | ICD-10-CM | POA: Diagnosis not present

## 2021-03-04 DIAGNOSIS — R609 Edema, unspecified: Secondary | ICD-10-CM | POA: Diagnosis not present

## 2021-03-04 LAB — CBC WITH DIFFERENTIAL/PLATELET
Abs Immature Granulocytes: 0 10*3/uL (ref 0.00–0.07)
Basophils Absolute: 0 10*3/uL (ref 0.0–0.1)
Basophils Relative: 0 %
Eosinophils Absolute: 0.3 10*3/uL (ref 0.0–0.5)
Eosinophils Relative: 1 %
HCT: 25.8 % — ABNORMAL LOW (ref 39.0–52.0)
Hemoglobin: 8 g/dL — ABNORMAL LOW (ref 13.0–17.0)
Lymphocytes Relative: 10 %
Lymphs Abs: 2.5 10*3/uL (ref 0.7–4.0)
MCH: 29.2 pg (ref 26.0–34.0)
MCHC: 31 g/dL (ref 30.0–36.0)
MCV: 94.2 fL (ref 80.0–100.0)
Monocytes Absolute: 1.3 10*3/uL — ABNORMAL HIGH (ref 0.1–1.0)
Monocytes Relative: 5 %
Neutro Abs: 21.2 10*3/uL — ABNORMAL HIGH (ref 1.7–7.7)
Neutrophils Relative %: 84 %
Platelets: 301 10*3/uL (ref 150–400)
RBC: 2.74 MIL/uL — ABNORMAL LOW (ref 4.22–5.81)
RDW: 16.1 % — ABNORMAL HIGH (ref 11.5–15.5)
WBC: 25.2 10*3/uL — ABNORMAL HIGH (ref 4.0–10.5)
nRBC: 2.2 % — ABNORMAL HIGH (ref 0.0–0.2)

## 2021-03-04 LAB — COMPREHENSIVE METABOLIC PANEL
ALT: 32 U/L (ref 0–44)
AST: 70 U/L — ABNORMAL HIGH (ref 15–41)
Albumin: 2 g/dL — ABNORMAL LOW (ref 3.5–5.0)
Alkaline Phosphatase: 167 U/L — ABNORMAL HIGH (ref 38–126)
Anion gap: 18 — ABNORMAL HIGH (ref 5–15)
BUN: 119 mg/dL — ABNORMAL HIGH (ref 6–20)
CO2: 17 mmol/L — ABNORMAL LOW (ref 22–32)
Calcium: 8.6 mg/dL — ABNORMAL LOW (ref 8.9–10.3)
Chloride: 104 mmol/L (ref 98–111)
Creatinine, Ser: 5.68 mg/dL — ABNORMAL HIGH (ref 0.61–1.24)
GFR, Estimated: 12 mL/min — ABNORMAL LOW (ref >60)
Glucose, Bld: 89 mg/dL (ref 70–99)
Potassium: 6.2 mmol/L — ABNORMAL HIGH (ref 3.5–5.1)
Sodium: 139 mmol/L (ref 135–145)
Total Bilirubin: 0.6 mg/dL (ref 0.3–1.2)
Total Protein: 8.4 g/dL — ABNORMAL HIGH (ref 6.5–8.1)

## 2021-03-04 LAB — RESP PANEL BY RT-PCR (FLU A&B, COVID) ARPGX2
Influenza A by PCR: NEGATIVE
Influenza B by PCR: NEGATIVE
SARS Coronavirus 2 by RT PCR: NEGATIVE

## 2021-03-04 LAB — BLOOD GAS, ARTERIAL
Acid-base deficit: 6.2 mmol/L — ABNORMAL HIGH (ref 0.0–2.0)
Bicarbonate: 17.9 mmol/L — ABNORMAL LOW (ref 20.0–28.0)
FIO2: 100
O2 Saturation: 95.1 %
Patient temperature: 98.6
pCO2 arterial: 31.2 mmHg — ABNORMAL LOW (ref 32.0–48.0)
pH, Arterial: 7.377 (ref 7.350–7.450)
pO2, Arterial: 102 mmHg (ref 83.0–108.0)

## 2021-03-04 LAB — URINALYSIS, ROUTINE W REFLEX MICROSCOPIC
Bilirubin Urine: NEGATIVE
Glucose, UA: NEGATIVE mg/dL
Hgb urine dipstick: NEGATIVE
Ketones, ur: NEGATIVE mg/dL
Leukocytes,Ua: NEGATIVE
Nitrite: NEGATIVE
Protein, ur: NEGATIVE mg/dL
Specific Gravity, Urine: 1.014 (ref 1.005–1.030)
pH: 5 (ref 5.0–8.0)

## 2021-03-04 LAB — TROPONIN I (HIGH SENSITIVITY)
Troponin I (High Sensitivity): 58 ng/L — ABNORMAL HIGH (ref <18)
Troponin I (High Sensitivity): 51 ng/L — ABNORMAL HIGH (ref <18)

## 2021-03-04 LAB — CREATININE, URINE, RANDOM: Creatinine, Urine: 150.51 mg/dL

## 2021-03-04 LAB — LACTIC ACID, PLASMA
Lactic Acid, Venous: 4.1 mmol/L (ref 0.5–1.9)
Lactic Acid, Venous: 2.4 mmol/L (ref 0.5–1.9)

## 2021-03-04 LAB — PROTIME-INR
INR: 1.3 — ABNORMAL HIGH (ref 0.8–1.2)
Prothrombin Time: 16.1 seconds — ABNORMAL HIGH (ref 11.4–15.2)

## 2021-03-04 LAB — LIPASE, BLOOD: Lipase: 158 U/L — ABNORMAL HIGH (ref 11–51)

## 2021-03-04 LAB — BRAIN NATRIURETIC PEPTIDE: B Natriuretic Peptide: 837.4 pg/mL — ABNORMAL HIGH (ref 0.0–100.0)

## 2021-03-04 LAB — SODIUM, URINE, RANDOM: Sodium, Ur: 52 mmol/L

## 2021-03-04 LAB — LACTATE DEHYDROGENASE: LDH: 340 U/L — ABNORMAL HIGH (ref 98–192)

## 2021-03-04 LAB — GLUCOSE, RANDOM: Glucose, Bld: 88 mg/dL (ref 70–99)

## 2021-03-04 IMAGING — DX DG CHEST 1V PORT
1 series · 1 of 1 positions shown · non-contrast
Comparison: [DATE]

CLINICAL DATA: Shortness of breath

EXAM:
PORTABLE CHEST 1 VIEW

[chest ap]
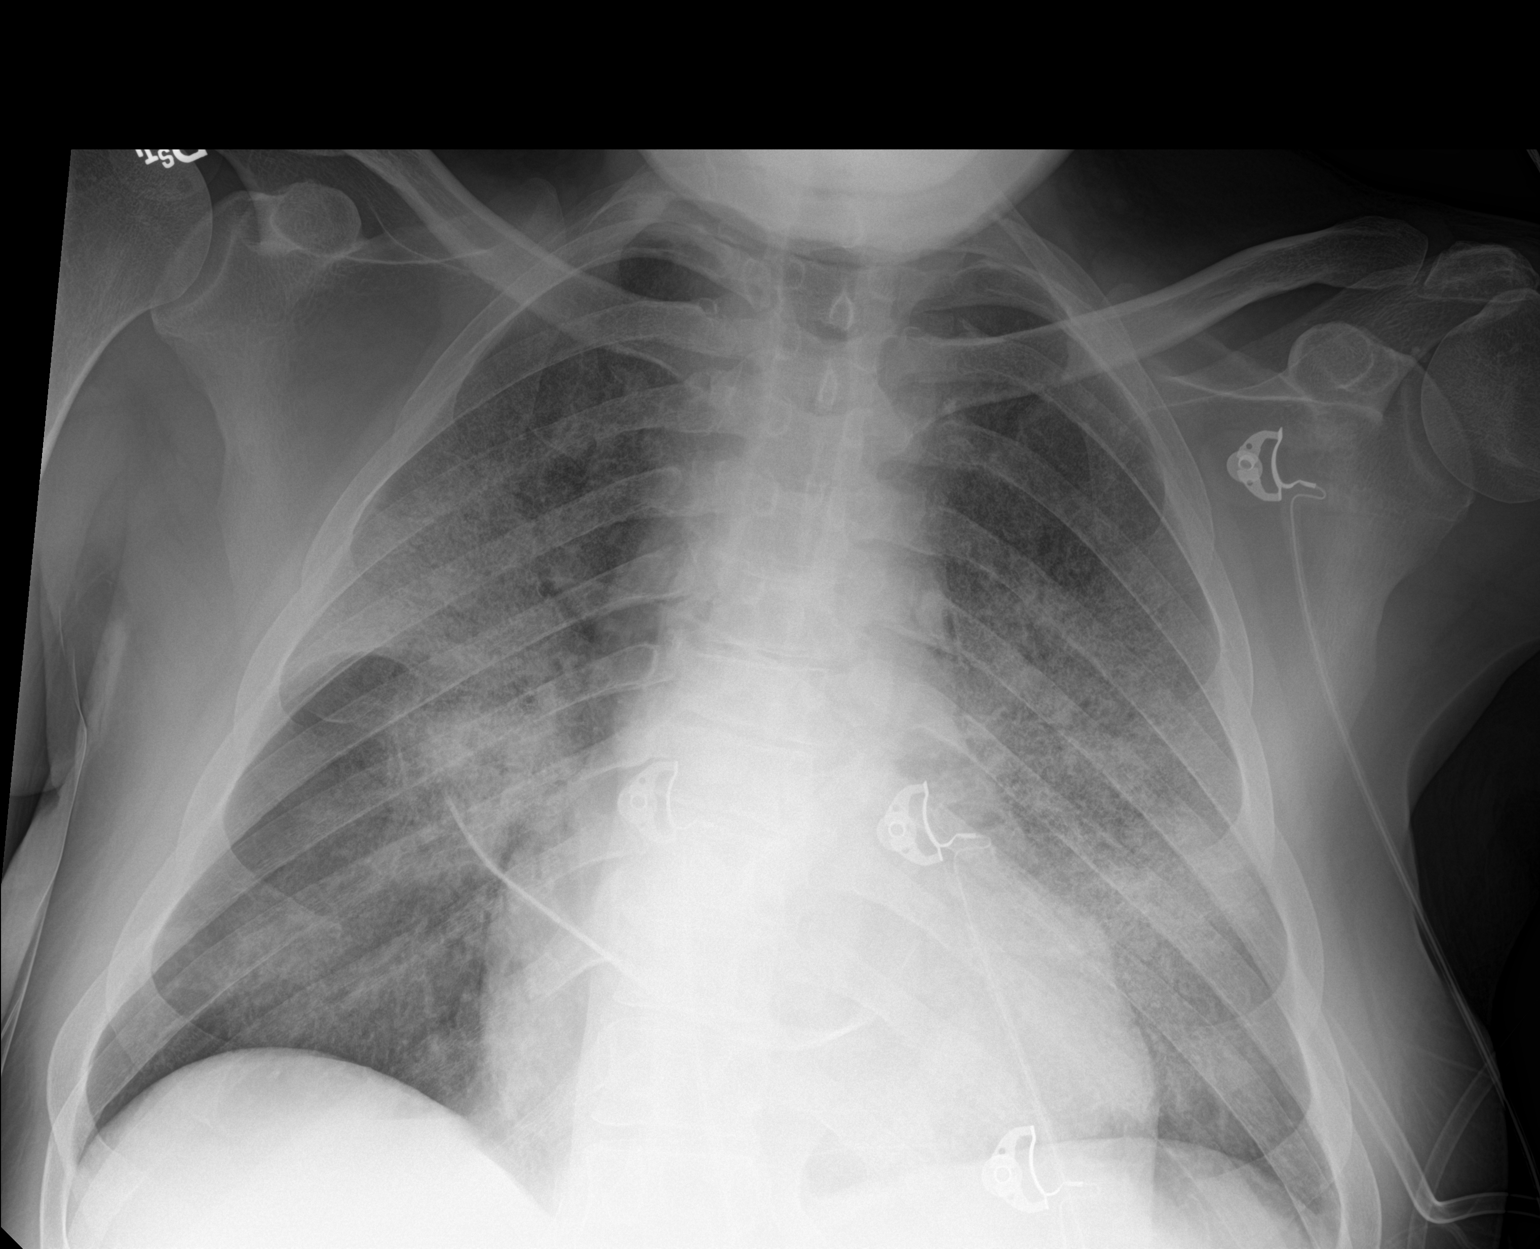

[1 of 1 positions shown; findings below may reference images not displayed]

FINDINGS: Diffuse airspace disease throughout the left lung and also in the
right upper lobe/perihilar region. Heart is mildly enlarged. No
effusions or pneumothorax. No acute bony abnormality.
IMPRESSION: Diffuse left lung airspace disease and airspace disease in the right
upper lobe. Findings most compatible with multifocal pneumonia. This
could conceivably represent asymmetric edema.

## 2021-03-04 MED ORDER — SODIUM ZIRCONIUM CYCLOSILICATE 10 G PO PACK
10.0000 g | PACK | ORAL | Status: AC
  Administered 2021-03-05: 10 g via ORAL
  Filled 2021-03-04: qty 1

## 2021-03-04 MED ORDER — PANTOPRAZOLE SODIUM 40 MG IV SOLR
40.0000 mg | Freq: Every day | INTRAVENOUS | Status: DC
  Administered 2021-03-04 – 2021-03-06 (×3): 40 mg via INTRAVENOUS
  Filled 2021-03-04 (×3): qty 40

## 2021-03-04 MED ORDER — DEXTROSE 50 % IV SOLN
1.0000 | Freq: Once | INTRAVENOUS | Status: AC
  Administered 2021-03-04: 50 mL via INTRAVENOUS
  Filled 2021-03-04: qty 50

## 2021-03-04 MED ORDER — FLUCONAZOLE IN SODIUM CHLORIDE 400-0.9 MG/200ML-% IV SOLN
400.0000 mg | INTRAVENOUS | Status: DC
  Administered 2021-03-05: 400 mg via INTRAVENOUS
  Filled 2021-03-04: qty 200

## 2021-03-04 MED ORDER — CLINDAMYCIN PHOSPHATE 900 MG/50ML IV SOLN
900.0000 mg | Freq: Three times a day (TID) | INTRAVENOUS | Status: DC
  Administered 2021-03-04 – 2021-03-09 (×15): 900 mg via INTRAVENOUS
  Filled 2021-03-04 (×15): qty 50

## 2021-03-04 MED ORDER — SODIUM CHLORIDE 0.9 % IV SOLN
2.0000 g | INTRAVENOUS | Status: DC
  Administered 2021-03-04 – 2021-03-05 (×2): 2 g via INTRAVENOUS
  Filled 2021-03-04 (×2): qty 2

## 2021-03-04 MED ORDER — INSULIN ASPART 100 UNIT/ML IJ SOLN
1.0000 [IU] | INTRAMUSCULAR | Status: DC
  Administered 2021-03-06: 3 [IU] via SUBCUTANEOUS
  Filled 2021-03-04: qty 0.03

## 2021-03-04 MED ORDER — LACTATED RINGERS IV BOLUS (SEPSIS)
1000.0000 mL | Freq: Once | INTRAVENOUS | Status: AC
Start: 2021-03-04 — End: 2021-03-04
  Administered 2021-03-04: 1000 mL via INTRAVENOUS

## 2021-03-04 MED ORDER — PRIMAQUINE PHOSPHATE 26.3 (15 BASE) MG PO TABS
30.0000 mg | ORAL_TABLET | ORAL | Status: DC
  Administered 2021-03-05 – 2021-03-06 (×3): 30 mg via ORAL
  Filled 2021-03-04 (×3): qty 2

## 2021-03-04 MED ORDER — HEPARIN SODIUM (PORCINE) 5000 UNIT/ML IJ SOLN
5000.0000 [IU] | Freq: Three times a day (TID) | INTRAMUSCULAR | Status: DC
  Administered 2021-03-04 – 2021-03-13 (×25): 5000 [IU] via SUBCUTANEOUS
  Filled 2021-03-04 (×25): qty 1

## 2021-03-04 MED ORDER — LACTATED RINGERS IV SOLN: INTRAVENOUS | Status: DC

## 2021-03-04 MED ORDER — INSULIN ASPART 100 UNIT/ML IV SOLN
5.0000 [IU] | Freq: Once | INTRAVENOUS | Status: AC
  Administered 2021-03-04: 5 [IU] via INTRAVENOUS
  Filled 2021-03-04: qty 0.05

## 2021-03-04 MED ORDER — DEXTROSE 10 % IV SOLN: Freq: Once | INTRAVENOUS | Status: AC

## 2021-03-04 MED ORDER — LACTATED RINGERS IV BOLUS (SEPSIS)
1000.0000 mL | Freq: Once | INTRAVENOUS | Status: AC
  Administered 2021-03-04: 1000 mL via INTRAVENOUS

## 2021-03-04 MED ORDER — POLYETHYLENE GLYCOL 3350 17 G PO PACK: 17.0000 g | PACK | Freq: Every day | ORAL | Status: DC | PRN

## 2021-03-04 MED ORDER — ONDANSETRON HCL 4 MG/2ML IJ SOLN
4.0000 mg | Freq: Four times a day (QID) | INTRAMUSCULAR | Status: DC | PRN
  Administered 2021-03-24 – 2021-03-27 (×2): 4 mg via INTRAVENOUS
  Filled 2021-03-04 (×2): qty 2

## 2021-03-04 MED ORDER — VANCOMYCIN VARIABLE DOSE PER UNSTABLE RENAL FUNCTION (PHARMACIST DOSING): Status: DC

## 2021-03-04 MED ORDER — LAMIVUDINE 150 MG PO TABS: 300.0000 mg | ORAL_TABLET | Freq: Every day | ORAL | Status: DC

## 2021-03-04 MED ORDER — DOLUTEGRAVIR SODIUM 50 MG PO TABS
50.0000 mg | ORAL_TABLET | Freq: Every day | ORAL | Status: DC
  Administered 2021-03-05 – 2021-03-06 (×2): 50 mg via ORAL
  Filled 2021-03-04 (×3): qty 1

## 2021-03-04 MED ORDER — GUAIFENESIN 100 MG/5ML PO SOLN
5.0000 mL | ORAL | Status: DC | PRN
  Administered 2021-03-05 (×3): 100 mg via ORAL
  Filled 2021-03-04 (×3): qty 10

## 2021-03-04 MED ORDER — MORPHINE SULFATE (PF) 2 MG/ML IV SOLN
1.0000 mg | INTRAVENOUS | Status: DC | PRN
  Administered 2021-03-04: 1 mg via INTRAVENOUS
  Filled 2021-03-04: qty 1

## 2021-03-04 MED ORDER — METHYLPREDNISOLONE SODIUM SUCC 40 MG IJ SOLR
40.0000 mg | INTRAMUSCULAR | Status: DC
  Administered 2021-03-05 – 2021-03-09 (×5): 40 mg via INTRAVENOUS
  Filled 2021-03-04 (×5): qty 1

## 2021-03-04 MED ORDER — VANCOMYCIN HCL 1500 MG/300ML IV SOLN
1500.0000 mg | Freq: Once | INTRAVENOUS | Status: AC
  Administered 2021-03-04: 1500 mg via INTRAVENOUS
  Filled 2021-03-04: qty 300

## 2021-03-04 MED ORDER — ALBUTEROL SULFATE (2.5 MG/3ML) 0.083% IN NEBU
2.5000 mg | INHALATION_SOLUTION | RESPIRATORY_TRACT | Status: DC | PRN
  Administered 2021-03-22 – 2021-03-27 (×3): 2.5 mg via RESPIRATORY_TRACT
  Filled 2021-03-04 (×4): qty 3

## 2021-03-04 MED ORDER — SODIUM CHLORIDE 0.9 % IV SOLN
500.0000 mg | Freq: Once | INTRAVENOUS | Status: AC
  Administered 2021-03-04: 500 mg via INTRAVENOUS
  Filled 2021-03-04: qty 500

## 2021-03-04 MED ORDER — ACETAMINOPHEN 325 MG PO TABS: 650.0000 mg | ORAL_TABLET | Freq: Four times a day (QID) | ORAL | Status: DC | PRN

## 2021-03-04 MED ORDER — SODIUM CHLORIDE 0.9 % IV SOLN
1.0000 g | Freq: Once | INTRAVENOUS | Status: AC
  Administered 2021-03-04: 1 g via INTRAVENOUS
  Filled 2021-03-04: qty 10

## 2021-03-04 MED ORDER — DOCUSATE SODIUM 100 MG PO CAPS: 100.0000 mg | ORAL_CAPSULE | Freq: Two times a day (BID) | ORAL | Status: DC | PRN

## 2021-03-04 MED ORDER — LAMIVUDINE 150 MG PO TABS
150.0000 mg | ORAL_TABLET | Freq: Every day | ORAL | Status: DC
  Administered 2021-03-05 – 2021-03-06 (×2): 150 mg via ORAL
  Filled 2021-03-04 (×3): qty 1

## 2021-03-04 NOTE — ED Notes (Signed)
Critical Lab Value: Lactic Acid 4.1   Dr. Melina Copa made aware.

## 2021-03-04 NOTE — ED Triage Notes (Signed)
Pt bib [facility] from home.  Initial call was for inability to ambulate.  Upon arrival EMS reports pt was having difficulty breathing w/ RR at 28-30.  EMS reports hearing wheezing.  Respirations shallow.  Pt only able to speak one sentence B/L feet w/ pitting edema.

## 2021-03-04 NOTE — ED Provider Notes (Signed)
[facility] DEPT Provider Note   CSN: [id number] Arrival date & time: 03/04/21  1747     History Chief Complaint  Patient presents with   Shortness of Breath    Bruce Hunter is a 45 y.o. male.  He is here with a complaint of diarrhea that is been going on for a week or 2.  Increased shortness of breath.  Cough sometimes productive of some beige sputum.  He does not think he had any fever.  Denies chest pain.  He is also had swelling of his feet.  Difficulty ambulating.  He has a history of HIV.  That he has been taking his medications.  The history is provided by the patient.  Shortness of Breath Severity:  Severe Onset quality:  Gradual Duration:  1 week Timing:  Intermittent Progression:  Worsening Chronicity:  New Relieved by:  Nothing Worsened by:  Activity Ineffective treatments:  None tried Associated symptoms: cough, sputum production and wheezing   Associated symptoms: no abdominal pain, no chest pain, no fever, no headaches, no hemoptysis, no rash and no sore throat   Risk factors: tobacco use       Past Medical History:  Diagnosis Date   Asthma    Diabetes mellitus without complication ([place])    Gout    Hyperlipidemia    Hypertension    Obesity     Patient Active Problem List   Diagnosis Date Noted   Candida esophagitis ([place]) 02/19/2021   Neuropathic pain 02/19/2021   Polypharmacy 02/19/2021   Cough    N&V (nausea and vomiting)    AIDS (acquired immune deficiency syndrome) (HCC)    Esophageal dysphagia    Gastroesophageal reflux disease with esophagitis without hemorrhage    Gastrointestinal hemorrhage    Abdominal pain    Creatinine elevation    Hypoglycemia 02/12/2021   HTN (hypertension) 02/12/2021   Anemia 02/12/2021   AKI (acute kidney injury) ([place]) 02/11/2021    Past Surgical History:  Procedure Laterality Date   BIOPSY  02/15/2021   Procedure: BIOPSY;  Surgeon: Ronnette Juniper, MD;  Location: [facility];   Service: Gastroenterology;;   COLONOSCOPY WITH PROPOFOL N/A 02/15/2021   Procedure: COLONOSCOPY WITH PROPOFOL;  Surgeon: Ronnette Juniper, MD;  Location: [facility];  Service: Gastroenterology;  Laterality: N/A;   ESOPHAGOGASTRODUODENOSCOPY (EGD) WITH PROPOFOL N/A 02/15/2021   Procedure: ESOPHAGOGASTRODUODENOSCOPY (EGD) WITH PROPOFOL;  Surgeon: Ronnette Juniper, MD;  Location: [facility];  Service: Gastroenterology;  Laterality: N/A;   GANGLION CYST EXCISION Right 02/26/2020   Procedure: EXCISION TOPHUS RIGHT FOOT;  Surgeon: Evelina Bucy, DPM;  Location: [place];  Service: Podiatry;  Laterality: Right;   NO PAST SURGERIES         Family History  Problem Relation Age of Onset   Asthma Mother    Cancer Mother    Diabetes Mother    Obesity Mother    Obesity Sister    Diabetes Brother    Obesity Brother    Cancer Maternal Grandmother    Diabetes Maternal Grandmother    Diabetes Other     Social History   Tobacco Use   Smoking status: Every Day    Packs/day: 0.25    Pack years: 0.00    Types: Cigarettes   Smokeless tobacco: Never  Substance Use Topics   Alcohol use: Yes   Drug use: Yes    Frequency: 1.0 times per week    Types: Marijuana    Comment: marijuana at midnight  last night; daily    Home Medications Prior to Admission medications   Medication Sig Start Date End Date Taking? Authorizing Provider  ACCU-CHEK GUIDE test strip 2 (two) times daily. 09/24/20   [provider]  Accu-Chek Softclix Lancets lancets 2 (two) times daily. 09/30/20   [provider]  amLODipine (NORVASC) 10 MG tablet Take 10 mg by mouth daily.    [provider]  clonazePAM (KLONOPIN) 0.5 MG tablet Take 0.5 mg by mouth 3 (three) times daily as needed. 10/28/20   [provider]  dapsone 100 MG tablet Take 1 tablet (100 mg total) by mouth daily. 02/16/21   Pokhrel, Corrie Mckusick, MD  dolutegravir (TIVICAY) 50 MG tablet Take 1 tablet (50 mg total) by mouth  daily. 02/16/21   Pokhrel, Corrie Mckusick, MD  folic acid (FOLVITE) 1 MG tablet Take 1 tablet (1 mg total) by mouth daily. 02/16/21   Pokhrel, Corrie Mckusick, MD  gabapentin (NEURONTIN) 600 MG tablet Take 600 mg by mouth 3 (three) times daily. 03/20/20   [provider]  glipiZIDE (GLUCOTROL) 5 MG tablet Take 5 mg by mouth 2 (two) times daily. 09/29/20   [provider]  hydrochlorothiazide (HYDRODIURIL) 25 MG tablet Take 25 mg by mouth daily. 08/31/20   [provider]  HYDROcodone-acetaminophen (NORCO/VICODIN) 5-325 MG tablet Take 1 tablet by mouth 2 (two) times daily as needed. 10/28/20   [provider]  hydrocortisone 2.5 % cream Apply topically. 11/06/20   [provider]  lamiVUDine (EPIVIR) 300 MG tablet Take 1 tablet (300 mg total) by mouth daily. 02/16/21   Pokhrel, Corrie Mckusick, MD  lisinopril (ZESTRIL) 20 MG tablet Take 20 mg by mouth daily. 10/06/20   [provider]  meloxicam (MOBIC) 15 MG tablet TAKE 1 TABLET(15 MG) BY MOUTH DAILY 09/14/20   Evelina Bucy, DPM  metFORMIN (GLUCOPHAGE) 500 MG tablet Take by mouth 2 (two) times daily with a meal.    [provider]  miconazole (MICOTIN) 2 % cream Apply topically. 06/26/20   [provider]  MITIGARE 0.6 MG CAPS Take 1 capsule by mouth daily. 11/09/20   [provider]  mometasone (ELOCON) 0.1 % lotion Apply topically. 11/09/20   [provider]  omeprazole (PRILOSEC) 20 MG capsule Take 1 capsule (20 mg total) by mouth daily. 02/15/21   Pokhrel, Corrie Mckusick, MD  ondansetron (ZOFRAN) 4 MG tablet Take 1 tablet (4 mg total) by mouth every 6 (six) hours as needed for nausea or vomiting. 02/15/21   Pokhrel, Corrie Mckusick, MD  sodium bicarbonate 650 MG tablet Take 1 tablet (650 mg total) by mouth 3 (three) times daily. 02/15/21 03/17/21  Pokhrel, Corrie Mckusick, MD  traMADol (ULTRAM) 50 MG tablet Take by mouth every 6 (six) hours as needed.    [provider]  vitamin B-12 1000 MCG tablet Take 1 tablet  (1,000 mcg total) by mouth daily. 02/16/21   Pokhrel, Corrie Mckusick, MD  zolpidem (AMBIEN) 5 MG tablet Take 5 mg by mouth at bedtime as needed. 07/09/20   [provider]    Allergies    Patient has no known allergies.  Review of Systems   Review of Systems  Constitutional:  Negative for fever.  HENT:  Negative for sore throat.   Eyes:  Negative for visual disturbance.  Respiratory:  Positive for cough, sputum production, shortness of breath and wheezing. Negative for hemoptysis.   Cardiovascular:  Positive for leg swelling. Negative for chest pain.  Gastrointestinal:  Positive for diarrhea. Negative for abdominal pain.  Genitourinary:  Negative for dysuria.  Musculoskeletal:  Positive for gait problem.  Skin:  Negative for rash.  Neurological:  Negative for headaches.   Physical Exam Updated Vital Signs BP 93/72 (BP Location: Right Arm)   Pulse (!) 102   Resp (!) 23   Ht '5\' 10"'$  (1.778 m)   Wt 90.7 kg   SpO2 92%   BMI 28.70 kg/m   Physical Exam Vitals and nursing note reviewed.  Constitutional:      Appearance: He is well-developed.  HENT:     Head: Normocephalic and atraumatic.  Eyes:     Conjunctiva/sclera: Conjunctivae normal.  Cardiovascular:     Rate and Rhythm: Regular rhythm. Tachycardia present.     Heart sounds: No murmur heard. Pulmonary:     Effort: Tachypnea and accessory muscle usage present. No respiratory distress.     Breath sounds: Wheezing (few scattered) present.  Abdominal:     Palpations: Abdomen is soft.     Tenderness: There is no abdominal tenderness.  Musculoskeletal:     Cervical back: Neck supple.     Right lower leg: Edema present.     Left lower leg: Edema present.  Skin:    General: Skin is warm and dry.  Neurological:     General: No focal deficit present.     Mental Status: He is alert.    ED Results / Procedures / Treatments   Labs (all labs ordered are listed, but only abnormal results are displayed) Labs Reviewed   COMPREHENSIVE METABOLIC PANEL - Abnormal; Notable for the following components:      Result Value   Potassium 6.2 (*)    CO2 17 (*)    BUN 119 (*)    Creatinine, Ser 5.68 (*)    Calcium 8.6 (*)    Total Protein 8.4 (*)    Albumin 2.0 (*)    AST 70 (*)    Alkaline Phosphatase 167 (*)    GFR, Estimated 12 (*)    Anion gap 18 (*)    All other components within normal limits  BRAIN NATRIURETIC PEPTIDE - Abnormal; Notable for the following components:   B Natriuretic Peptide 837.4 (*)    All other components within normal limits  LACTIC ACID, PLASMA - Abnormal; Notable for the following components:   Lactic Acid, Venous 4.1 (*)    All other components within normal limits  LACTIC ACID, PLASMA - Abnormal; Notable for the following components:   Lactic Acid, Venous 2.4 (*)    All other components within normal limits  CBC WITH DIFFERENTIAL/PLATELET - Abnormal; Notable for the following components:   WBC 25.2 (*)    RBC 2.74 (*)    Hemoglobin 8.0 (*)    HCT 25.8 (*)    RDW 16.1 (*)    nRBC 2.2 (*)    Neutro Abs 21.2 (*)    Monocytes Absolute 1.3 (*)    All other components within normal limits  LIPASE, BLOOD - Abnormal; Notable for the following components:   Lipase 158 (*)    All other components within normal limits  BLOOD GAS, ARTERIAL - Abnormal; Notable for the following components:   pCO2 arterial 31.2 (*)    Bicarbonate 17.9 (*)    Acid-base deficit 6.2 (*)    All other components within normal limits  PROTIME-INR - Abnormal; Notable for the following components:   Prothrombin Time 16.1 (*)    INR 1.3 (*)    All other components within normal limits  LACTATE DEHYDROGENASE - Abnormal; Notable for the following components:   LDH 340 (*)    All other components within normal limits  CBC - Abnormal; Notable for the following components:   WBC 24.4 (*)    RBC 2.47 (*)    Hemoglobin 7.1 (*)    HCT 24.0 (*)    MCHC 29.6 (*)    RDW 16.3 (*)    Platelets 112 (*)     nRBC 1.8 (*)    All other components within normal limits  COMPREHENSIVE METABOLIC PANEL - Abnormal; Notable for the following components:   Sodium 134 (*)    Potassium 6.1 (*)    CO2 17 (*)    Glucose, Bld 240 (*)    BUN 92 (*)    Creatinine, Ser 5.50 (*)    Calcium 7.8 (*)    Albumin 1.8 (*)    AST 54 (*)    Alkaline Phosphatase 145 (*)    GFR, Estimated 12 (*)    All other components within normal limits  CBC WITH DIFFERENTIAL/PLATELET - Abnormal; Notable for the following components:   WBC 26.5 (*)    RBC 2.39 (*)    Hemoglobin 7.0 (*)    HCT 23.0 (*)    RDW 16.1 (*)    nRBC 1.8 (*)    Neutro Abs 20.9 (*)    Monocytes Absolute 2.3 (*)    Abs Immature Granulocytes 1.03 (*)    All other components within normal limits  BASIC METABOLIC PANEL - Abnormal; Notable for the following components:   Potassium 6.2 (*)    CO2 18 (*)    Glucose, Bld 128 (*)    BUN 93 (*)    Creatinine, Ser 5.50 (*)    Calcium 7.9 (*)    GFR, Estimated 12 (*)    All other components within normal limits  POTASSIUM - Abnormal; Notable for the following components:   Potassium 5.2 (*)    All other components within normal limits  LACTATE DEHYDROGENASE - Abnormal; Notable for the following components:   LDH 320 (*)    All other components within normal limits  CBG MONITORING, ED - Abnormal; Notable for the following components:   Glucose-Capillary 202 (*)    All other components within normal limits  CBG MONITORING, ED - Abnormal; Notable for the following components:   Glucose-Capillary 55 (*)    All other components within normal limits  CBG MONITORING, ED - Abnormal; Notable for the following components:   Glucose-Capillary 53 (*)    All other components within normal limits  CBG MONITORING, ED - Abnormal; Notable for the following components:   Glucose-Capillary 132 (*)    All other components within normal limits  TROPONIN I (HIGH SENSITIVITY) - Abnormal; Notable for the following  components:   Troponin I (High Sensitivity) 58 (*)    All other components within normal limits  TROPONIN I (HIGH SENSITIVITY) - Abnormal; Notable for the following components:   Troponin I (High Sensitivity) 51 (*)    All other components within normal limits  CULTURE, BLOOD (ROUTINE X 2)  RESP PANEL BY RT-PCR (FLU A&B, COVID) ARPGX2  MRSA PCR SCREENING  CULTURE, BLOOD (ROUTINE X 2)  PNEUMOCYSTIS JIROVECI SMEAR BY DFA  EXPECTORATED SPUTUM ASSESSMENT W GRAM STAIN, RFLX TO RESP C  GASTROINTESTINAL PANEL BY PCR, STOOL (REPLACES STOOL CULTURE)  RESPIRATORY PANEL BY PCR  CULTURE, FUNGUS WITHOUT SMEAR  URINALYSIS, ROUTINE W REFLEX MICROSCOPIC  GLUCOSE, RANDOM  SODIUM, URINE, RANDOM  CREATININE, URINE, RANDOM  STREP PNEUMONIAE URINARY ANTIGEN  POTASSIUM  PROCALCITONIN  GLUCOSE, CAPILLARY  PNEUMOCYSTIS PCR  TSH  POTASSIUM  POTASSIUM  LEGIONELLA PNEUMOPHILA SEROGP 1 UR AG  FUNGITELL, SERUM    EKG EKG Interpretation  Date/Time:  Friday March 05 2021 02:58:52 EDT Ventricular Rate:  96 PR Interval:  160 QRS Duration: 118 QT Interval:  383 QTC Calculation: 484 R Axis:   14 Text Interpretation: Sinus rhythm Nonspecific intraventricular conduction delay Low voltage, extremity and precordial leads When compared with ECG of 03/04/2021, No significant change was found Confirmed by Delora Fuel ([id number]) on 03/05/2021 3:22:58 AM  Radiology US RENAL  Result Date: 03/05/2021 CLINICAL DATA:  Acute renal injury EXAM: RENAL / URINARY TRACT ULTRASOUND COMPLETE COMPARISON:  02/12/2021 FINDINGS: Right Kidney: Renal measurements: 11.6 x 5.6 x 5.1 cm. = volume: 172 mL. Echogenicity within normal limits. No mass or hydronephrosis visualized. Left Kidney: Renal measurements: 14.1 x 7.2 x 7.1 cm. = volume: 373 mL. Echogenicity within normal limits. No mass or hydronephrosis visualized. Bladder: Appears normal for degree of bladder distention. Other: None. IMPRESSION: Normal appearing kidneys bilaterally.  Electronically Signed   By: Inez Catalina M.D.   On: 03/05/2021 11:34   DG Chest Port 1 View  Result Date: 03/04/2021 CLINICAL DATA:  Shortness of breath EXAM: PORTABLE CHEST 1 VIEW COMPARISON:  02/13/2021 FINDINGS: Diffuse airspace disease throughout the left lung and also in the right upper lobe/perihilar region. Heart is mildly enlarged. No effusions or pneumothorax. No acute bony abnormality. IMPRESSION: Diffuse left lung airspace disease and airspace disease in the right upper lobe. Findings most compatible with multifocal pneumonia. This could conceivably represent asymmetric edema. Electronically Signed   By: Rolm Baptise M.D.   On: 03/04/2021 19:09    Procedures .Critical Care  Date/Time: 03/05/2021 1:11 PM Performed by: [name], MD Authorized by: [name], MD   Critical care provider statement:    Critical care time (minutes):  90   Critical care time was exclusive of:  Separately billable procedures and treating other patients   Critical care was necessary to treat or prevent imminent or life-threatening deterioration of the following conditions:  Metabolic crisis, respiratory failure, renal failure and sepsis   Critical care was time spent personally by me on the following activities:  Discussions with consultants, evaluation of patient's response to treatment, examination of patient, ordering and performing treatments and interventions, ordering and review of laboratory studies, ordering and review of radiographic studies, pulse oximetry, re-evaluation of patient's condition, obtaining history from patient or surrogate, review of old charts and development of treatment plan with patient or surrogate   Medications Ordered in ED Medications  methylPREDNISolone sodium succinate (SOLU-MEDROL) 40 mg/mL injection 40 mg (0 mg Intravenous Hold 03/04/21 2207)  ceFEPIme (MAXIPIME) 2 g in sodium chloride 0.9 % 100 mL IVPB (0 g Intravenous Stopped 03/04/21 2249)  vancomycin  variable dose per unstable renal function (pharmacist dosing) (has no administration in time range)  clindamycin (CLEOCIN) IVPB 900 mg (900 mg Intravenous New Bag/Given 03/05/21 0731)  primaquine tablet 30 mg (30 mg Oral Given 03/05/21 0140)  dolutegravir (TIVICAY) tablet 50 mg (has no administration in time range)  lamiVUDine (EPIVIR) tablet 150 mg (has no administration in time range)  docusate sodium (COLACE) capsule 100 mg (has no administration in time range)  polyethylene glycol (MIRALAX / GLYCOLAX) packet 17 g (has no administration in time range)  heparin injection 5,000 Units (5,000 Units Subcutaneous Given 03/05/21 0737)  acetaminophen (TYLENOL)  tablet 650 mg (has no administration in time range)  ondansetron (ZOFRAN) injection 4 mg (has no administration in time range)  pantoprazole (PROTONIX) injection 40 mg (40 mg Intravenous Given 03/04/21 2340)  insulin aspart (novoLOG) injection 1-3 Units (1 Units Subcutaneous Not Given 03/05/21 1214)  albuterol (PROVENTIL) (2.5 MG/3ML) 0.083% nebulizer solution 2.5 mg (has no administration in time range)  guaiFENesin (ROBITUSSIN) 100 MG/5ML solution 100 mg (100 mg Oral Given 03/05/21 0141)  fluconazole (DIFLUCAN) IVPB 200 mg (has no administration in time range)  sodium zirconium cyclosilicate (LOKELMA) packet 10 g (has no administration in time range)  0.9 %  sodium chloride infusion ( Intravenous New Bag/Given 03/05/21 1144)  Chlorhexidine Gluconate Cloth 2 % PADS 6 each (6 each Topical Given 03/05/21 1144)  cefTRIAXone (ROCEPHIN) 1 g in sodium chloride 0.9 % 100 mL IVPB (0 g Intravenous Stopped 03/04/21 2045)  azithromycin (ZITHROMAX) 500 mg in sodium chloride 0.9 % 250 mL IVPB (0 mg Intravenous Stopped 03/04/21 2201)  lactated ringers bolus 1,000 mL (0 mLs Intravenous Stopped 03/04/21 2224)    And  lactated ringers bolus 1,000 mL (0 mLs Intravenous Stopped 03/04/21 2158)    And  lactated ringers bolus 1,000 mL (0 mLs Intravenous Stopped 03/04/21 2304)   insulin aspart (novoLOG) injection 5 Units (5 Units Intravenous Given 03/04/21 2020)    And  dextrose 50 % solution 50 mL (50 mLs Intravenous Given 03/04/21 2033)  dextrose 10 % infusion ( Intravenous Restarted 03/04/21 2220)  vancomycin (VANCOREADY) IVPB 1500 mg/300 mL (0 mg Intravenous Stopped 03/05/21 0050)  sodium zirconium cyclosilicate (LOKELMA) packet 10 g (10 g Oral Given 03/05/21 0249)  insulin aspart (novoLOG) injection 10 Units (10 Units Intravenous Given 03/05/21 0305)    And  dextrose 50 % solution 50 mL (50 mLs Intravenous Given 03/05/21 0306)  dextrose 50 % solution (25 mLs  Given 03/05/21 0900)    ED Course  I have reviewed the triage vital signs and the nursing notes.  Pertinent labs & imaging results that were available during my care of the patient were reviewed by me and considered in my medical decision making (see chart for details).  Clinical Course as of 03/05/21 1308  Thu Mar 04, 2021  1819 Last viral load on 02/13/2021 was 1.5 million [MB]  1900 Chest x-ray concerning for multifocal pneumonia possibly PCP. [MB]  1933 Received a call that the patient's lactic acid is 4.1.  Antibiotics have been ordered. [MB]  1946 With Dr. Patsey Berthold critical care who will evaluate the patient [MB]    Clinical Course User Index [MB] [name], MD   MDM Rules/Calculators/A&P                         Bruce Hunter was evaluated in Emergency Department on 03/04/2021 for the symptoms described in the history of present illness. He was evaluated in the context of the global COVID-19 pandemic, which necessitated consideration that the patient might be at risk for infection with the SARS-CoV-2 virus that causes COVID-19. Institutional protocols and algorithms that pertain to the evaluation of patients at risk for COVID-19 are in a state of rapid change based on information released by regulatory bodies including the CDC and federal and state organizations. These policies and algorithms were  followed during the patient's care in the ED.  This patient complains of shortness of breath diarrhea edema of feet; this involves an extensive number of treatment Options and is a complaint that  carries with it a high risk of complications and Morbidity. The differential includes infection including community-acquired pneumonia, PCP pneumonia, hypoxia, PE, pneumothorax, pulmonary edema, sepsis, renal failure, metabolic derangement, DVT  I ordered, reviewed and interpreted labs, which included CBC with elevated white count, hemoglobin slightly lower than baseline, chemistries markedly abnormal with new AKI hyperkalemia, BNP elevated, COVID testing negative, lactate elevated, troponins elevated will need to be trended, blood culture was sent I ordered medication IV fluids IV antibiotics for sepsis criteria I ordered imaging studies which included chest x-ray and I independently    visualized and interpreted imaging which showed multifocal pneumonia Additional history obtained from EMS and patient's family members Previous records obtained and reviewed in epic including recent admission and ID notes I consulted critical care Dr. Jerilee Hoh and discussed lab and imaging findings  Critical Interventions: Recognition and management of patient's sepsis with goal-directed fluids and antibiotics  After the interventions stated above, I reevaluated the patient and found patient still to be hypoxic and requiring nonrebreather.  Appreciate critical care's efforts and getting the patient admitted to the hospital.   Final Clinical Impression(s) / ED Diagnoses Final diagnoses:  Multifocal pneumonia  AKI (acute kidney injury) ([place])  Hyperkalemia  Hypoxia  HIV infection, unspecified symptom status ([place])    Rx / DC Orders ED Discharge Orders     None        [name], MD 03/05/21 1315

## 2021-03-04 NOTE — Sepsis Progress Note (Signed)
Following for Sepsis Protocol

## 2021-03-04 NOTE — H&P (Addendum)
NAME:  Bruce Hunter, MRN:  [id number], DOB:  1976-06-20, LOS: 0 ADMISSION DATE:  03/04/2021, CONSULTATION DATE:  03/04/21 REFERRING MD:  Melina Copa, CHIEF COMPLAINT:  Shortness of breath.     History of Present Illness:  45 yo man with HIV/AIDS here with acute hypoxemic respiratory failure.   Found to have peripheral edema, elevated lactic acid, hypotension.   SOB and cough productive of tan sputum.  Has had respiratory symptoms since he was discharged earlier this month (6/13).   LE edema, also new.  No chest pain.    Recently diagnosed with HIV/AIDS, started on medications, but patient tells me he "hasn't had time to start taking them" but he isn't really able to explain further.   Says he was been   In ED: on NRB, 100%, started on ceftriaxone,   Just discharged 6/13, came in with hypoglycemia, diagnosed with HIV.    Patient was initiated on lamivudine, Tivicay, dapsone and fluconazole on discharge.  Patient does have an appointment with infectious disease as outpatient.  Candida esophagitis. At the time.    ID note 6/17:  DTG plus lamivudine. He can get the combined pill, dovato, upon discharge. Can send rx for dovato for free 30 day supply Esophageal candidiasis = recommend to treat with fluconazole '400mg'$  po daily x 14-21 days Opportunistic infection proph = continue on dapsone '100mg'$  daily  Pertinent  Medical History  Asthma DM2 Gout HTN Obesity  GERD AKI Neuropathy   Meds: dapsone,  Clonazepam, amlodipine, tivicay, folic acid, gabapentin, glipizide, hctz, vicodin, epivir, lisinopril, metformin, mitigare , omeprazole, ultram, ambien, b12  Significant Hospital Events: Including procedures, antibiotic start and stop dates in addition to other pertinent events     Interim History / Subjective:    Objective   Blood pressure 90/62, pulse (!) 107, resp. rate (!) 26, height '5\' 10"'$  (1.778 m), weight 90.7 kg, SpO2 100 %.       No intake or output data in the 24 hours ending  03/04/21 2046 Filed Weights   03/04/21 1807  Weight: 90.7 kg    Examination: General: slightly tachypneic, less labored when not speaking  HENT: NCAT, slightly protuberant eyes  Lungs: ctab Cardiovascular: RRR no mgr  Abdomen: NT, ND, NBS Extremities: non pitting edema in feet B  Neuro: awake alert oriented  GU:   Resolved Hospital Problem list     Assessment & Plan:  PNA, immunocompromised - Tolerating NRB oxygen now.  Satting well.  ABG with PaO2 >100.  B pneumonia on CXR.  Sepsis, intermittent hypotension, responsive to fluids. Received 3L  Hold further fluids given respiratory status.   Cover broadly given immunocompromised state.   Cefepime, vanc ordered.  Treatment for PJP included given findings on CXR. Starting steroids as well.  Clinda and primaquine (bactrim not ideal given degree of renal failure).   PCP stain, sputum, respiratory culture, Urine strep and urine legionella ag.  Check echocardiogram as well.  Albuterol and guaifenesin also ordered.   AKI on CKD:  (last Creatinines in  2-3 range) Hyperkalemia. T waves, ekg ok.  Nephrology paged.  Given insulin, D50, for hyperk.  Albuterol. Lokelma ordered.  Repeat labs in AM.  Recent renal US negative  LE edema:  Hypoalbuminemia, nephrotic syndrome?   UA pending  Elevated AP, AST 70, ALT 32,  Monitor.  T bili normal.    AIDS/HIV Low CD 4, HIV viral load >1.47 mil Ordering meds as started by ID - dolutegravir, lamivudine Per patient he has not  taken at home.  Will need ID consult in AM.   HTN: hold antiHTN  Neuropathic pain: in feet, B. Will not continue gabapentin given aki.   Morphine prn.  Acetaminophen for pain or fever, limited to q6 given transaminase elevation.    DM2: iss.  Hold PO meds    Best Practice (right click and "Reselect all SmartList Selections" daily)   Diet/type: NPO w/ oral meds DVT prophylaxis: prophylactic heparin  GI prophylaxis: PPI Lines: N/A Foley:  N/A Code Status:   full code Last date of multidisciplinary goals of care discussion '[]'$   Labs   CBC: Recent Labs  Lab 03/04/21 1814  WBC 25.2*  NEUTROABS 21.2*  HGB 8.0*  HCT 25.8*  MCV 94.2  PLT [id number]    Basic Metabolic Panel: Recent Labs  Lab 03/04/21 1814  NA 139  K 6.2*  CL 104  CO2 17*  GLUCOSE 89  BUN 119*  CREATININE 5.68*  CALCIUM 8.6*   GFR: Estimated Creatinine Clearance: 18.8 mL/min (A) (by C-G formula based on SCr of 5.68 mg/dL (H)). Recent Labs  Lab 03/04/21 1814  WBC 25.2*  LATICACIDVEN 4.1*    Liver Function Tests: Recent Labs  Lab 03/04/21 1814  AST 70*  ALT 32  ALKPHOS 167*  BILITOT 0.6  PROT 8.4*  ALBUMIN 2.0*   Recent Labs  Lab 03/04/21 1814  LIPASE 158*   No results for input(s): AMMONIA in the last 168 hours.  ABG    Component Value Date/Time   TCO2 27 02/26/2020 0635     Coagulation Profile: No results for input(s): INR, PROTIME in the last 168 hours.  Cardiac Enzymes: No results for input(s): CKTOTAL, CKMB, CKMBINDEX, TROPONINI in the last 168 hours.  HbA1C: Hgb A1c MFr Bld  Date/Time Value Ref Range Status  02/12/2021 03:22 AM 5.9 (H) 4.8 - 5.6 % Final    Comment:    (NOTE)         Prediabetes: 5.7 - 6.4         Diabetes: >6.4         Glycemic control for adults with diabetes: <7.0     CBG: No results for input(s): GLUCAP in the last 168 hours.  Review of Systems:   Negative aside from HPI  Past Medical History:  He,  has a past medical history of Asthma, Diabetes mellitus without complication ([place]), Gout, Hyperlipidemia, Hypertension, and Obesity.   Surgical History:   Past Surgical History:  Procedure Laterality Date   BIOPSY  02/15/2021   Procedure: BIOPSY;  Surgeon: Ronnette Juniper, MD;  Location: [facility];  Service: Gastroenterology;;   COLONOSCOPY WITH PROPOFOL N/A 02/15/2021   Procedure: COLONOSCOPY WITH PROPOFOL;  Surgeon: Ronnette Juniper, MD;  Location: [facility];  Service: Gastroenterology;  Laterality: N/A;    ESOPHAGOGASTRODUODENOSCOPY (EGD) WITH PROPOFOL N/A 02/15/2021   Procedure: ESOPHAGOGASTRODUODENOSCOPY (EGD) WITH PROPOFOL;  Surgeon: Ronnette Juniper, MD;  Location: [facility];  Service: Gastroenterology;  Laterality: N/A;   GANGLION CYST EXCISION Right 02/26/2020   Procedure: EXCISION TOPHUS RIGHT FOOT;  Surgeon: Evelina Bucy, DPM;  Location: [facility];  Service: Podiatry;  Laterality: Right;   NO PAST SURGERIES       Social History:   reports that he has been smoking cigarettes. He has been smoking an average of 0.25 packs per day. He has never used smokeless tobacco. He reports current alcohol use. He reports current drug use. Frequency: 1.00 time per week. Drug: Marijuana.   Family History:  His  family history includes Asthma in his mother; Cancer in his maternal grandmother and mother; Diabetes in his brother, maternal grandmother, mother, and another family member; Obesity in his brother, mother, and sister.   Allergies No Known Allergies   Home Medications  Prior to Admission medications   Medication Sig Start Date End Date Taking? Authorizing Provider  ACCU-CHEK GUIDE test strip 2 (two) times daily. 09/24/20   [provider]  Accu-Chek Softclix Lancets lancets 2 (two) times daily. 09/30/20   [provider]  amLODipine (NORVASC) 10 MG tablet Take 10 mg by mouth daily.    [provider]  clonazePAM (KLONOPIN) 0.5 MG tablet Take 0.5 mg by mouth 3 (three) times daily as needed. 10/28/20   [provider]  dapsone 100 MG tablet Take 1 tablet (100 mg total) by mouth daily. 02/16/21   Pokhrel, Corrie Mckusick, MD  dolutegravir (TIVICAY) 50 MG tablet Take 1 tablet (50 mg total) by mouth daily. 02/16/21   Pokhrel, Corrie Mckusick, MD  folic acid (FOLVITE) 1 MG tablet Take 1 tablet (1 mg total) by mouth daily. 02/16/21   Pokhrel, Corrie Mckusick, MD  gabapentin (NEURONTIN) 600 MG tablet Take 600 mg by mouth 3 (three) times daily. 03/20/20   [provider]   glipiZIDE (GLUCOTROL) 5 MG tablet Take 5 mg by mouth 2 (two) times daily. 09/29/20   [provider]  hydrochlorothiazide (HYDRODIURIL) 25 MG tablet Take 25 mg by mouth daily. 08/31/20   [provider]  HYDROcodone-acetaminophen (NORCO/VICODIN) 5-325 MG tablet Take 1 tablet by mouth 2 (two) times daily as needed. 10/28/20   [provider]  hydrocortisone 2.5 % cream Apply topically. 11/06/20   [provider]  lamiVUDine (EPIVIR) 300 MG tablet Take 1 tablet (300 mg total) by mouth daily. 02/16/21   Pokhrel, Corrie Mckusick, MD  lisinopril (ZESTRIL) 20 MG tablet Take 20 mg by mouth daily. 10/06/20   [provider]  meloxicam (MOBIC) 15 MG tablet TAKE 1 TABLET(15 MG) BY MOUTH DAILY 09/14/20   Evelina Bucy, DPM  metFORMIN (GLUCOPHAGE) 500 MG tablet Take by mouth 2 (two) times daily with a meal.    [provider]  miconazole (MICOTIN) 2 % cream Apply topically. 06/26/20   [provider]  MITIGARE 0.6 MG CAPS Take 1 capsule by mouth daily. 11/09/20   [provider]  mometasone (ELOCON) 0.1 % lotion Apply topically. 11/09/20   [provider]  omeprazole (PRILOSEC) 20 MG capsule Take 1 capsule (20 mg total) by mouth daily. 02/15/21   Pokhrel, Corrie Mckusick, MD  ondansetron (ZOFRAN) 4 MG tablet Take 1 tablet (4 mg total) by mouth every 6 (six) hours as needed for nausea or vomiting. 02/15/21   Pokhrel, Corrie Mckusick, MD  sodium bicarbonate 650 MG tablet Take 1 tablet (650 mg total) by mouth 3 (three) times daily. 02/15/21 03/17/21  Pokhrel, Corrie Mckusick, MD  traMADol (ULTRAM) 50 MG tablet Take by mouth every 6 (six) hours as needed.    [provider]  vitamin B-12 1000 MCG tablet Take 1 tablet (1,000 mcg total) by mouth daily. 02/16/21   Pokhrel, Corrie Mckusick, MD  zolpidem (AMBIEN) 5 MG tablet Take 5 mg by mouth at bedtime as needed. 07/09/20   [provider]     Critical care time: 60 minutes

## 2021-03-04 NOTE — Progress Notes (Signed)
Pharmacy Antibiotic Note  Bruce Hunter is a 45 y.o. 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  Pharmacy has been consulted for Cefepime, Vancomycin.  Initial consult for sulfamethoxazole-trimethoprim dosing will be changed per MD due to AKI/hyperkalemia. AKI on CKD:  SCr baseline ~2.5, admit SCr 5.68 K 6.2 WBC 25 LA 4.1  Plan: Cefepime 2g IV q24h Vancomycin 1500 mg IV x1 dose Further vancomycin dosing based on renal function and levels. Measure Vanc peak and trough as needed.  Goal AUC = 400 - 550 Follow up renal function, culture results, and clinical course.   Height: '5\' 10"'$  (177.8 cm) Weight: 90.7 kg (200 lb) IBW/kg (Calculated) : 73  No data recorded.  Recent Labs  Lab 03/04/21 1814  WBC 25.2*  CREATININE 5.68*  LATICACIDVEN 4.1*    Estimated Creatinine Clearance: 18.8 mL/min (A) (by C-G formula based on SCr of 5.68 mg/dL (H)).    No Known Allergies  Antimicrobials this admission: 6/30 Cefepime >>  6/30 Vancomycin >>   Dose adjustments this admission:   Microbiology results:   Thank you for allowing pharmacy to be a part of this patient's care.  Gretta Arab PharmD, BCPS Clinical Pharmacist [facility] [phone] 03/04/2021 9:47 PM

## 2021-03-05 ENCOUNTER — Inpatient Hospital Stay (HOSPITAL_COMMUNITY): Payer: Medicaid Other

## 2021-03-05 ENCOUNTER — Ambulatory Visit: Payer: Medicaid Other | Admitting: Podiatry

## 2021-03-05 DIAGNOSIS — R7989 Other specified abnormal findings of blood chemistry: Secondary | ICD-10-CM

## 2021-03-05 DIAGNOSIS — J9600 Acute respiratory failure, unspecified whether with hypoxia or hypercapnia: Secondary | ICD-10-CM

## 2021-03-05 DIAGNOSIS — J189 Pneumonia, unspecified organism: Secondary | ICD-10-CM | POA: Diagnosis not present

## 2021-03-05 DIAGNOSIS — N179 Acute kidney failure, unspecified: Secondary | ICD-10-CM | POA: Diagnosis not present

## 2021-03-05 DIAGNOSIS — B59 Pneumocystosis: Secondary | ICD-10-CM

## 2021-03-05 DIAGNOSIS — R0902 Hypoxemia: Secondary | ICD-10-CM | POA: Diagnosis not present

## 2021-03-05 DIAGNOSIS — J9601 Acute respiratory failure with hypoxia: Secondary | ICD-10-CM | POA: Diagnosis not present

## 2021-03-05 DIAGNOSIS — B20 Human immunodeficiency virus [HIV] disease: Secondary | ICD-10-CM | POA: Diagnosis not present

## 2021-03-05 DIAGNOSIS — I5033 Acute on chronic diastolic (congestive) heart failure: Secondary | ICD-10-CM

## 2021-03-05 DIAGNOSIS — E1142 Type 2 diabetes mellitus with diabetic polyneuropathy: Secondary | ICD-10-CM

## 2021-03-05 DIAGNOSIS — B3781 Candidal esophagitis: Secondary | ICD-10-CM

## 2021-03-05 LAB — CBC
HCT: 24 % — ABNORMAL LOW (ref 39.0–52.0)
Hemoglobin: 7.1 g/dL — ABNORMAL LOW (ref 13.0–17.0)
MCH: 28.7 pg (ref 26.0–34.0)
MCHC: 29.6 g/dL — ABNORMAL LOW (ref 30.0–36.0)
MCV: 97.2 fL (ref 80.0–100.0)
Platelets: 112 10*3/uL — ABNORMAL LOW (ref 150–400)
RBC: 2.47 MIL/uL — ABNORMAL LOW (ref 4.22–5.81)
RDW: 16.3 % — ABNORMAL HIGH (ref 11.5–15.5)
WBC: 24.4 10*3/uL — ABNORMAL HIGH (ref 4.0–10.5)
nRBC: 1.8 % — ABNORMAL HIGH (ref 0.0–0.2)

## 2021-03-05 LAB — RESPIRATORY PANEL BY PCR

## 2021-03-05 LAB — POTASSIUM
Potassium: 5.2 mmol/L — ABNORMAL HIGH (ref 3.5–5.1)
Potassium: 4.8 mmol/L (ref 3.5–5.1)
Potassium: 4.4 mmol/L (ref 3.5–5.1)
Potassium: 4.2 mmol/L (ref 3.5–5.1)

## 2021-03-05 LAB — STREP PNEUMONIAE URINARY ANTIGEN: Strep Pneumo Urinary Antigen: NEGATIVE

## 2021-03-05 LAB — COMPREHENSIVE METABOLIC PANEL
ALT: 26 U/L (ref 0–44)
AST: 54 U/L — ABNORMAL HIGH (ref 15–41)
Albumin: 1.8 g/dL — ABNORMAL LOW (ref 3.5–5.0)
Alkaline Phosphatase: 145 U/L — ABNORMAL HIGH (ref 38–126)
Anion gap: 15 (ref 5–15)
BUN: 92 mg/dL — ABNORMAL HIGH (ref 6–20)
CO2: 17 mmol/L — ABNORMAL LOW (ref 22–32)
Calcium: 7.8 mg/dL — ABNORMAL LOW (ref 8.9–10.3)
Chloride: 102 mmol/L (ref 98–111)
Creatinine, Ser: 5.5 mg/dL — ABNORMAL HIGH (ref 0.61–1.24)
GFR, Estimated: 12 mL/min — ABNORMAL LOW (ref >60)
Glucose, Bld: 240 mg/dL — ABNORMAL HIGH (ref 70–99)
Potassium: 6.1 mmol/L — ABNORMAL HIGH (ref 3.5–5.1)
Sodium: 134 mmol/L — ABNORMAL LOW (ref 135–145)
Total Bilirubin: 0.7 mg/dL (ref 0.3–1.2)
Total Protein: 7.2 g/dL (ref 6.5–8.1)

## 2021-03-05 LAB — CBC WITH DIFFERENTIAL/PLATELET
Abs Immature Granulocytes: 1.03 10*3/uL — ABNORMAL HIGH (ref 0.00–0.07)
Basophils Absolute: 0 10*3/uL (ref 0.0–0.1)
Basophils Relative: 0 %
Eosinophils Absolute: 0.1 10*3/uL (ref 0.0–0.5)
Eosinophils Relative: 0 %
HCT: 23 % — ABNORMAL LOW (ref 39.0–52.0)
Hemoglobin: 7 g/dL — ABNORMAL LOW (ref 13.0–17.0)
Immature Granulocytes: 4 %
Lymphocytes Relative: 8 %
Lymphs Abs: 2.2 10*3/uL (ref 0.7–4.0)
MCH: 29.3 pg (ref 26.0–34.0)
MCHC: 30.4 g/dL (ref 30.0–36.0)
MCV: 96.2 fL (ref 80.0–100.0)
Monocytes Absolute: 2.3 10*3/uL — ABNORMAL HIGH (ref 0.1–1.0)
Monocytes Relative: 9 %
Neutro Abs: 20.9 10*3/uL — ABNORMAL HIGH (ref 1.7–7.7)
Neutrophils Relative %: 79 %
Platelets: 270 10*3/uL (ref 150–400)
RBC: 2.39 MIL/uL — ABNORMAL LOW (ref 4.22–5.81)
RDW: 16.1 % — ABNORMAL HIGH (ref 11.5–15.5)
WBC: 26.5 10*3/uL — ABNORMAL HIGH (ref 4.0–10.5)
nRBC: 1.8 % — ABNORMAL HIGH (ref 0.0–0.2)

## 2021-03-05 LAB — MRSA PCR SCREENING: MRSA by PCR: NEGATIVE

## 2021-03-05 LAB — ECHOCARDIOGRAM COMPLETE
Area-P 1/2: 3.91 cm2
Height: 70 in
S' Lateral: 2.8 cm
Weight: 3200 oz

## 2021-03-05 LAB — CBG MONITORING, ED
Glucose-Capillary: 202 mg/dL — ABNORMAL HIGH (ref 70–99)
Glucose-Capillary: 55 mg/dL — ABNORMAL LOW (ref 70–99)
Glucose-Capillary: 53 mg/dL — ABNORMAL LOW (ref 70–99)
Glucose-Capillary: 132 mg/dL — ABNORMAL HIGH (ref 70–99)

## 2021-03-05 LAB — GLUCOSE, CAPILLARY
Glucose-Capillary: 82 mg/dL (ref 70–99)
Glucose-Capillary: 50 mg/dL — ABNORMAL LOW (ref 70–99)
Glucose-Capillary: 55 mg/dL — ABNORMAL LOW (ref 70–99)
Glucose-Capillary: 60 mg/dL — ABNORMAL LOW (ref 70–99)
Glucose-Capillary: 47 mg/dL — ABNORMAL LOW (ref 70–99)
Glucose-Capillary: 71 mg/dL (ref 70–99)

## 2021-03-05 LAB — BASIC METABOLIC PANEL
Anion gap: 14 (ref 5–15)
BUN: 93 mg/dL — ABNORMAL HIGH (ref 6–20)
CO2: 18 mmol/L — ABNORMAL LOW (ref 22–32)
Calcium: 7.9 mg/dL — ABNORMAL LOW (ref 8.9–10.3)
Chloride: 104 mmol/L (ref 98–111)
Creatinine, Ser: 5.5 mg/dL — ABNORMAL HIGH (ref 0.61–1.24)
GFR, Estimated: 12 mL/min — ABNORMAL LOW (ref >60)
Glucose, Bld: 128 mg/dL — ABNORMAL HIGH (ref 70–99)
Potassium: 6.2 mmol/L — ABNORMAL HIGH (ref 3.5–5.1)
Sodium: 136 mmol/L (ref 135–145)

## 2021-03-05 LAB — GLUCOSE, RANDOM: Glucose, Bld: 65 mg/dL — ABNORMAL LOW (ref 70–99)

## 2021-03-05 LAB — PROCALCITONIN: Procalcitonin: 3.1 ng/mL

## 2021-03-05 LAB — LACTATE DEHYDROGENASE: LDH: 320 U/L — ABNORMAL HIGH (ref 98–192)

## 2021-03-05 IMAGING — US US RENAL
1 series · 15 of 24 positions shown · non-contrast
Comparison: [DATE]

CLINICAL DATA: Acute renal injury

EXAM:
RENAL / URINARY TRACT ULTRASOUND COMPLETE

[Series 1: us renal m[facility] & [facility] · 15 of 24 slices shown]
[im 1/24]
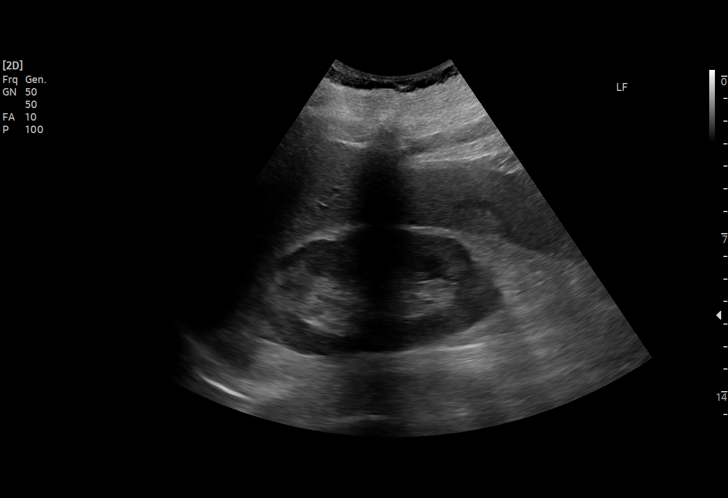
[im 3/24]
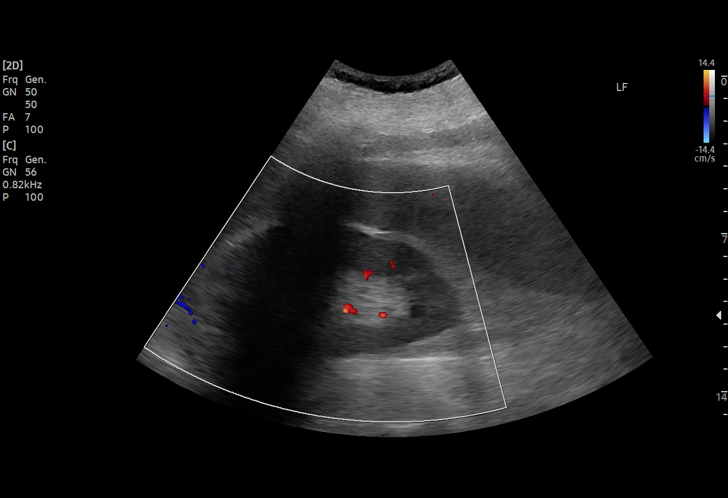
[im 5/24]
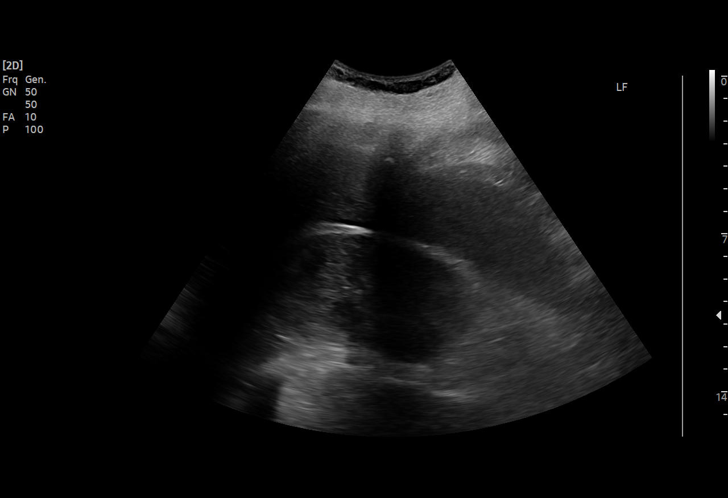
[im 6/24]
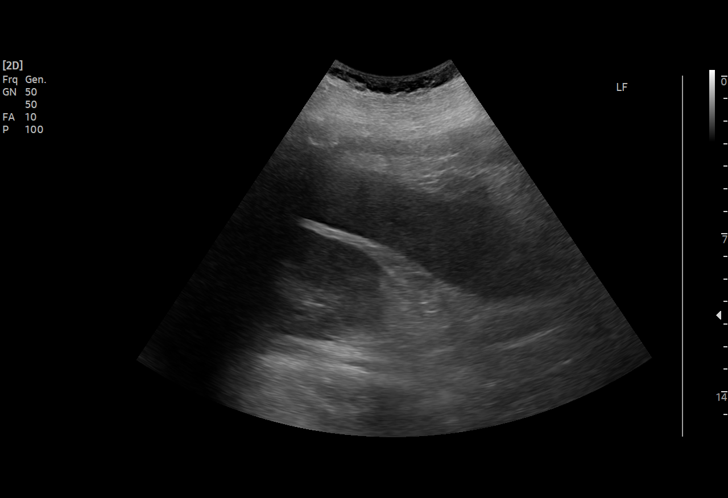
[im 8/24]
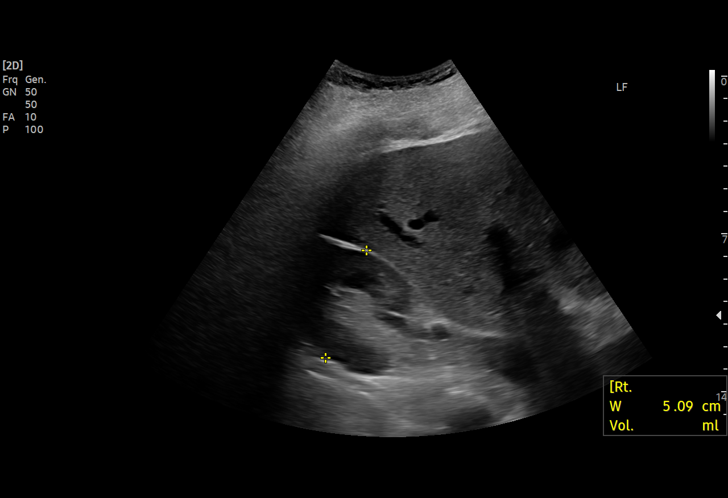
[im 9/24]
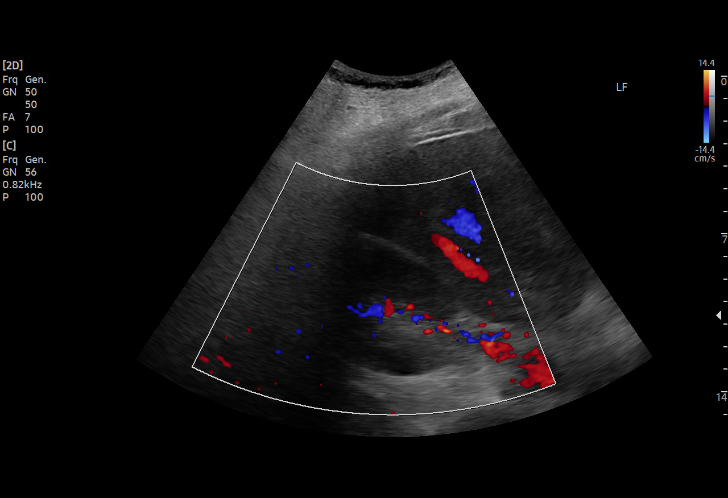
[im 11/24]
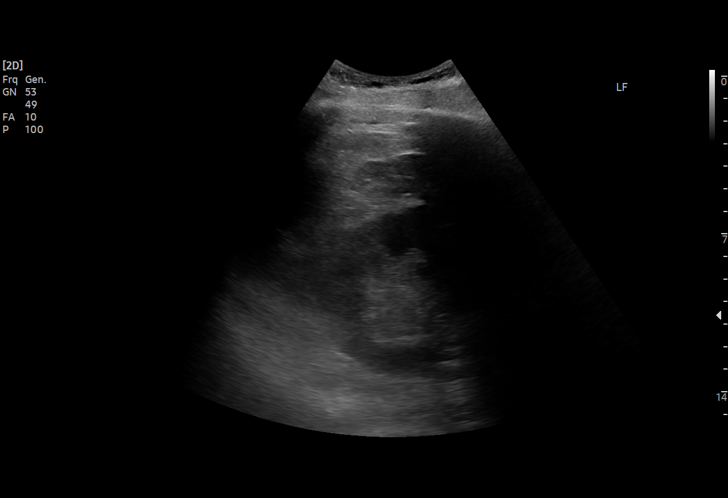
[im 13/24]
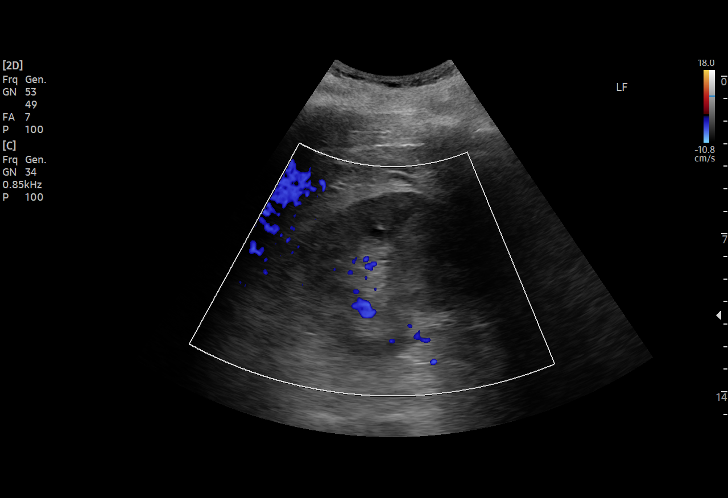
[im 14/24]
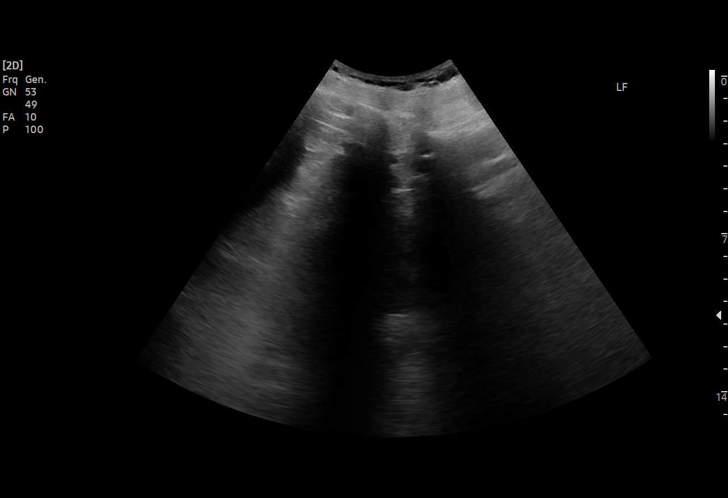
[im 16/24]
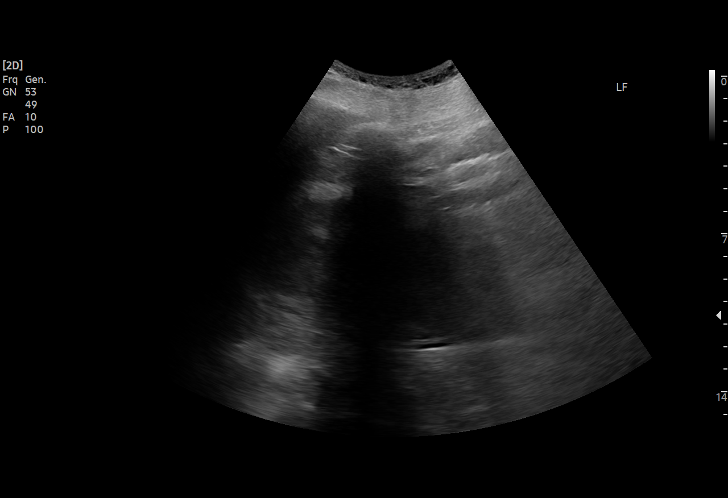
[im 17/24]
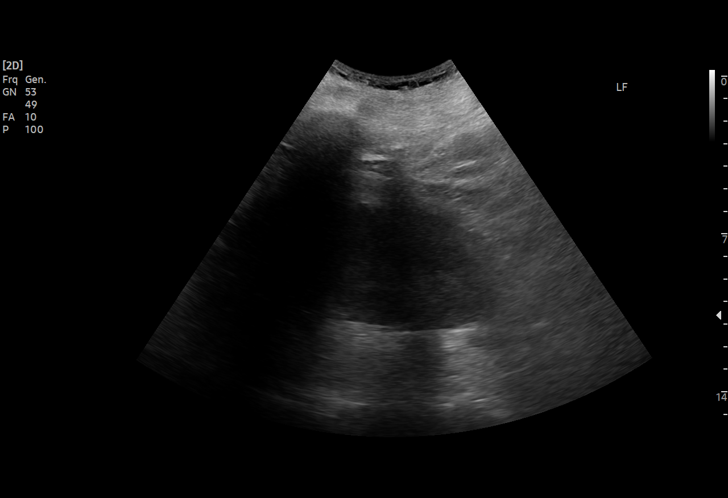
[im 19/24]
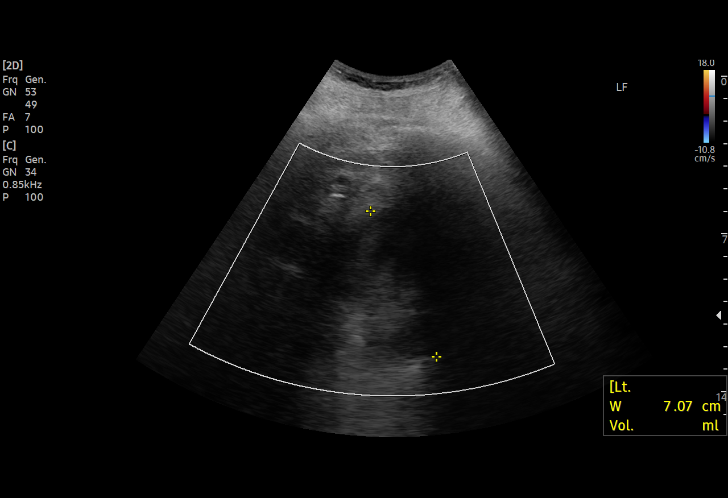
[im 21/24]
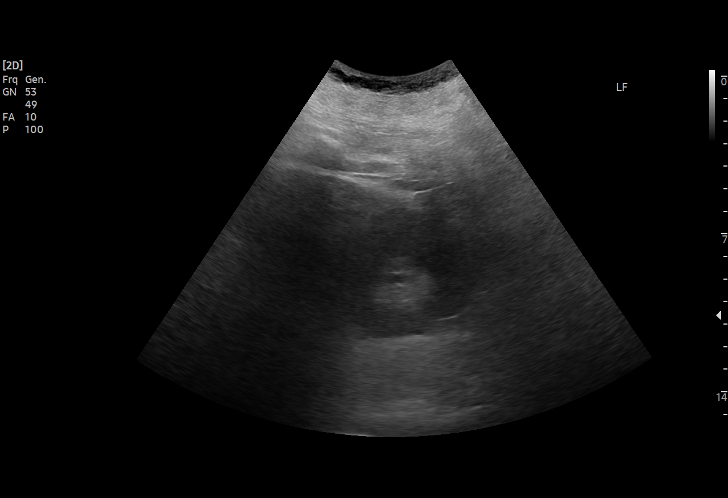
[im 22/24]
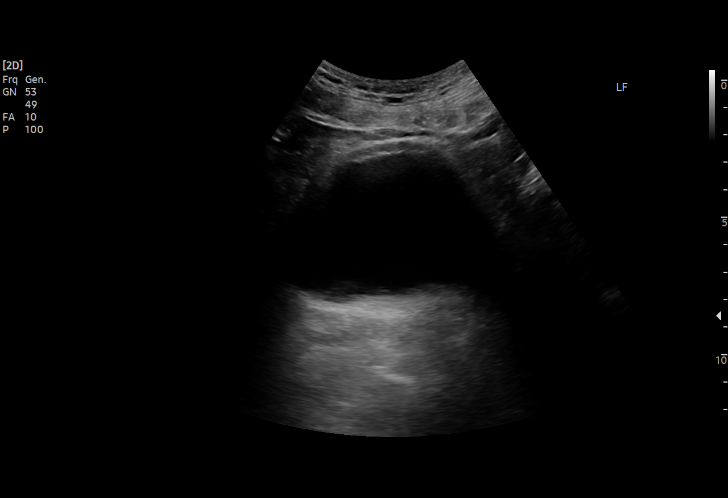
[im 24/24]
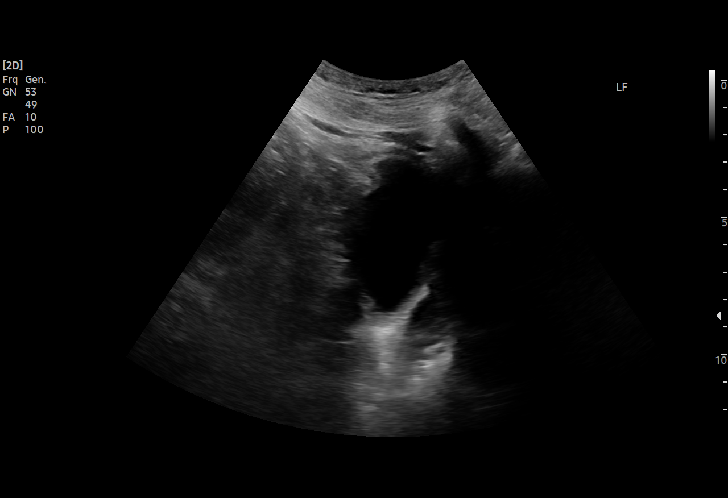

[15 of 24 positions shown; findings below may reference images not displayed]

FINDINGS: Right Kidney:

Renal measurements: 11.6 x 5.6 x 5.1 cm. = volume: 172 mL.
Echogenicity within normal limits. No mass or hydronephrosis
visualized.

Left Kidney:

Renal measurements: 14.1 x 7.2 x 7.1 cm. = volume: 373 mL.
Echogenicity within normal limits. No mass or hydronephrosis
visualized.

Bladder:

Appears normal for degree of bladder distention.

Other:

None.
IMPRESSION: Normal appearing kidneys bilaterally.

## 2021-03-05 IMAGING — DX DG ABDOMEN 1V
1 series · 1 of 1 positions shown · non-contrast
Comparison: CT [DATE]

CLINICAL DATA: NG tube placement.

EXAM:
ABDOMEN - 1 VIEW

[abdomen kub]
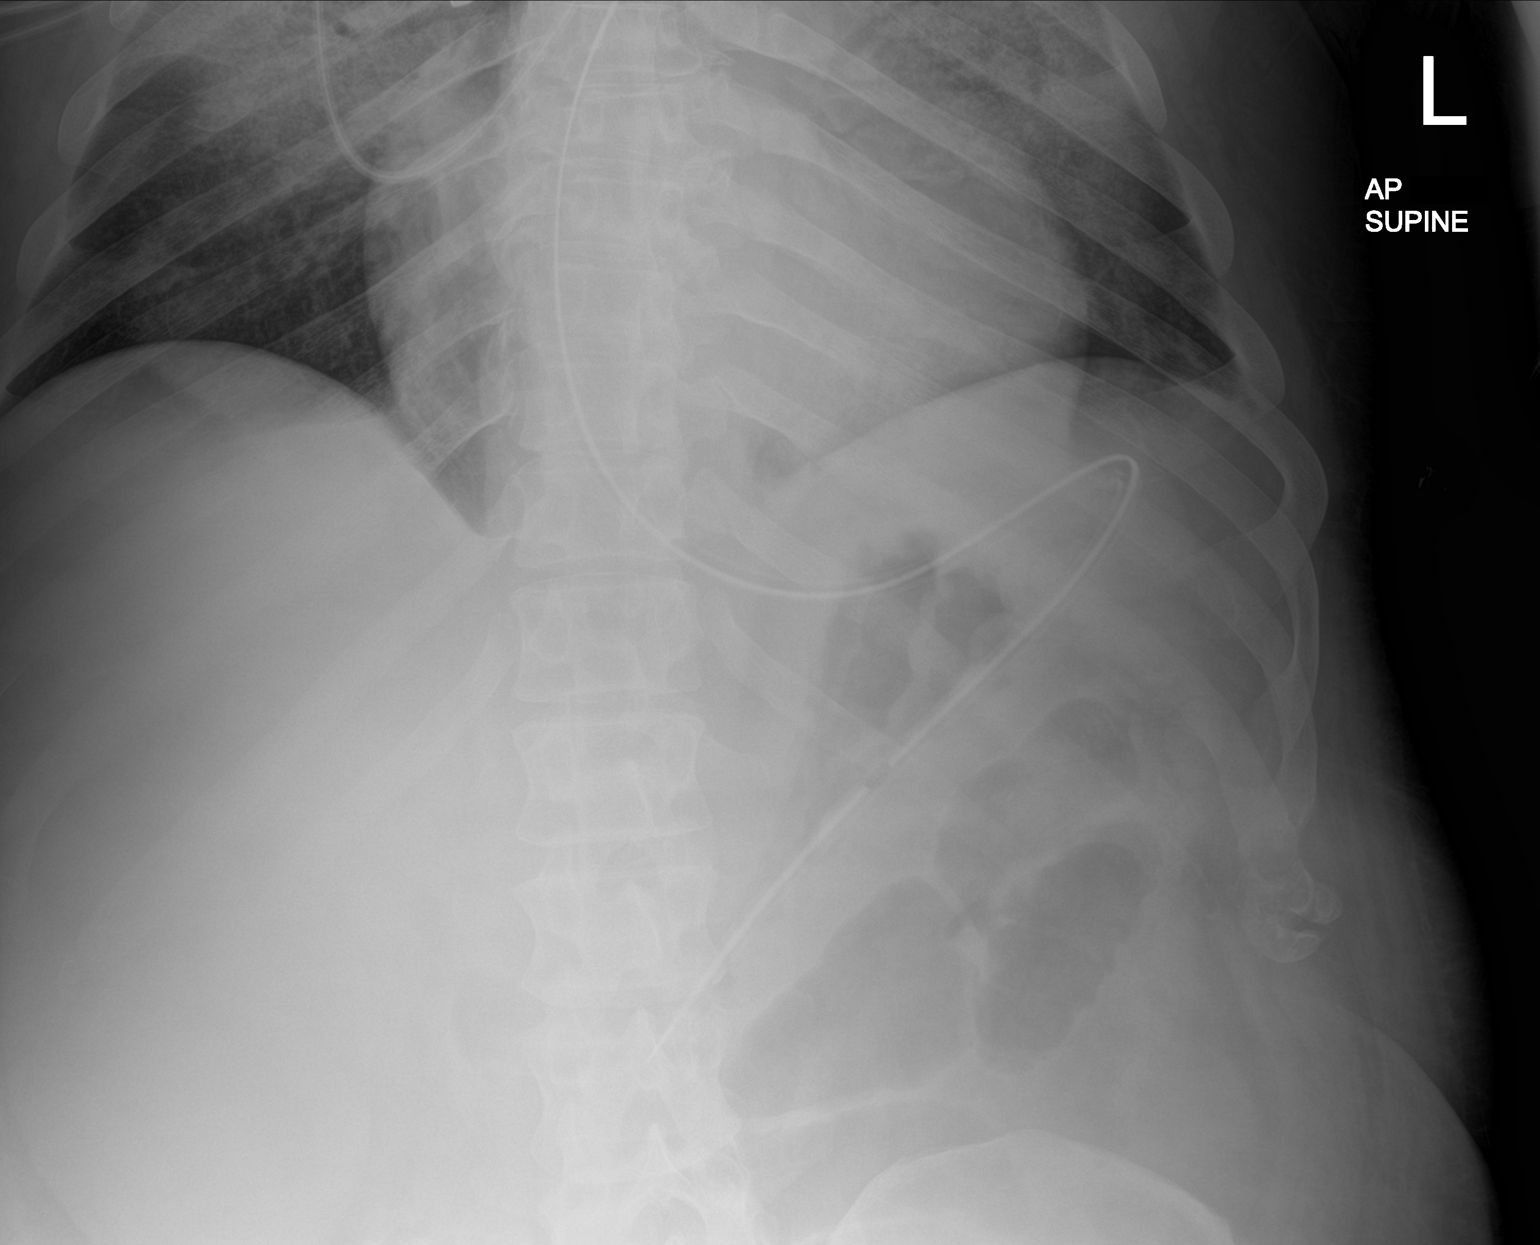

[1 of 1 positions shown; findings below may reference images not displayed]

FINDINGS: Tip and side port of the enteric tube below the diaphragm in the
stomach. No bowel dilatation in the upper abdomen.
IMPRESSION: Tip and side port of the enteric tube below the diaphragm in the
stomach.

## 2021-03-05 IMAGING — DX DG CHEST 1V PORT
1 series · 1 of 1 positions shown · non-contrast
Comparison: Radiograph earlier today.

CLINICAL DATA: Intubation.

EXAM:
PORTABLE CHEST 1 VIEW

[chest ap]
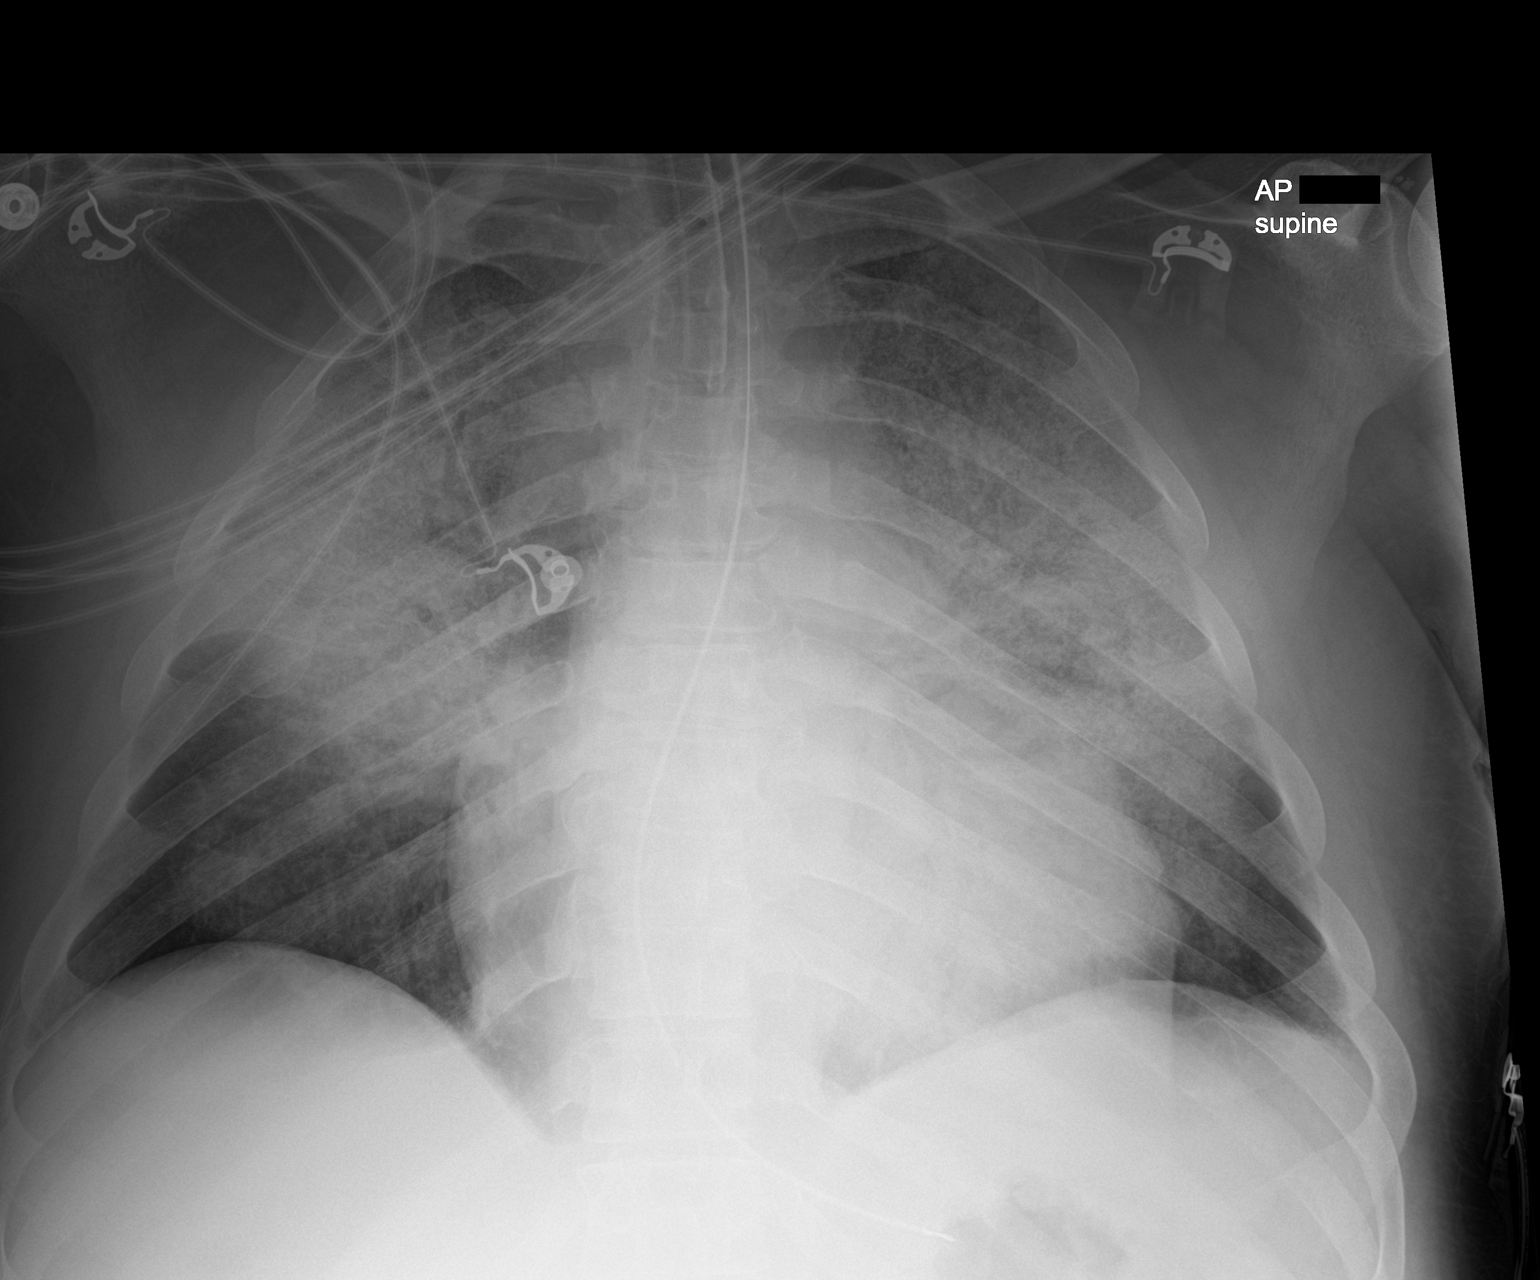

[1 of 1 positions shown; findings below may reference images not displayed]

FINDINGS: Endotracheal tube tip 4.6 cm from the carina. Enteric tube is in
place, better assessed on subsequent abdominal radiograph. Stable
cardiomegaly. Bilateral perihilar airspace disease with central air
bronchograms, left greater than right, not significantly changed
from earlier today. No convincing pleural effusion. No pneumothorax.
IMPRESSION: 1. Endotracheal tube tip 4.6 cm from the carina. Enteric tube in
place, better assessed on subsequent abdominal radiograph.
2. Stable cardiomegaly and bilateral perihilar airspace
disease/edema.

## 2021-03-05 IMAGING — DX DG CHEST 1V PORT
1 series · 1 of 1 positions shown · non-contrast
Comparison: [DATE]

CLINICAL DATA: Shortness of breath.

EXAM:
PORTABLE CHEST 1 VIEW

[chest ap]
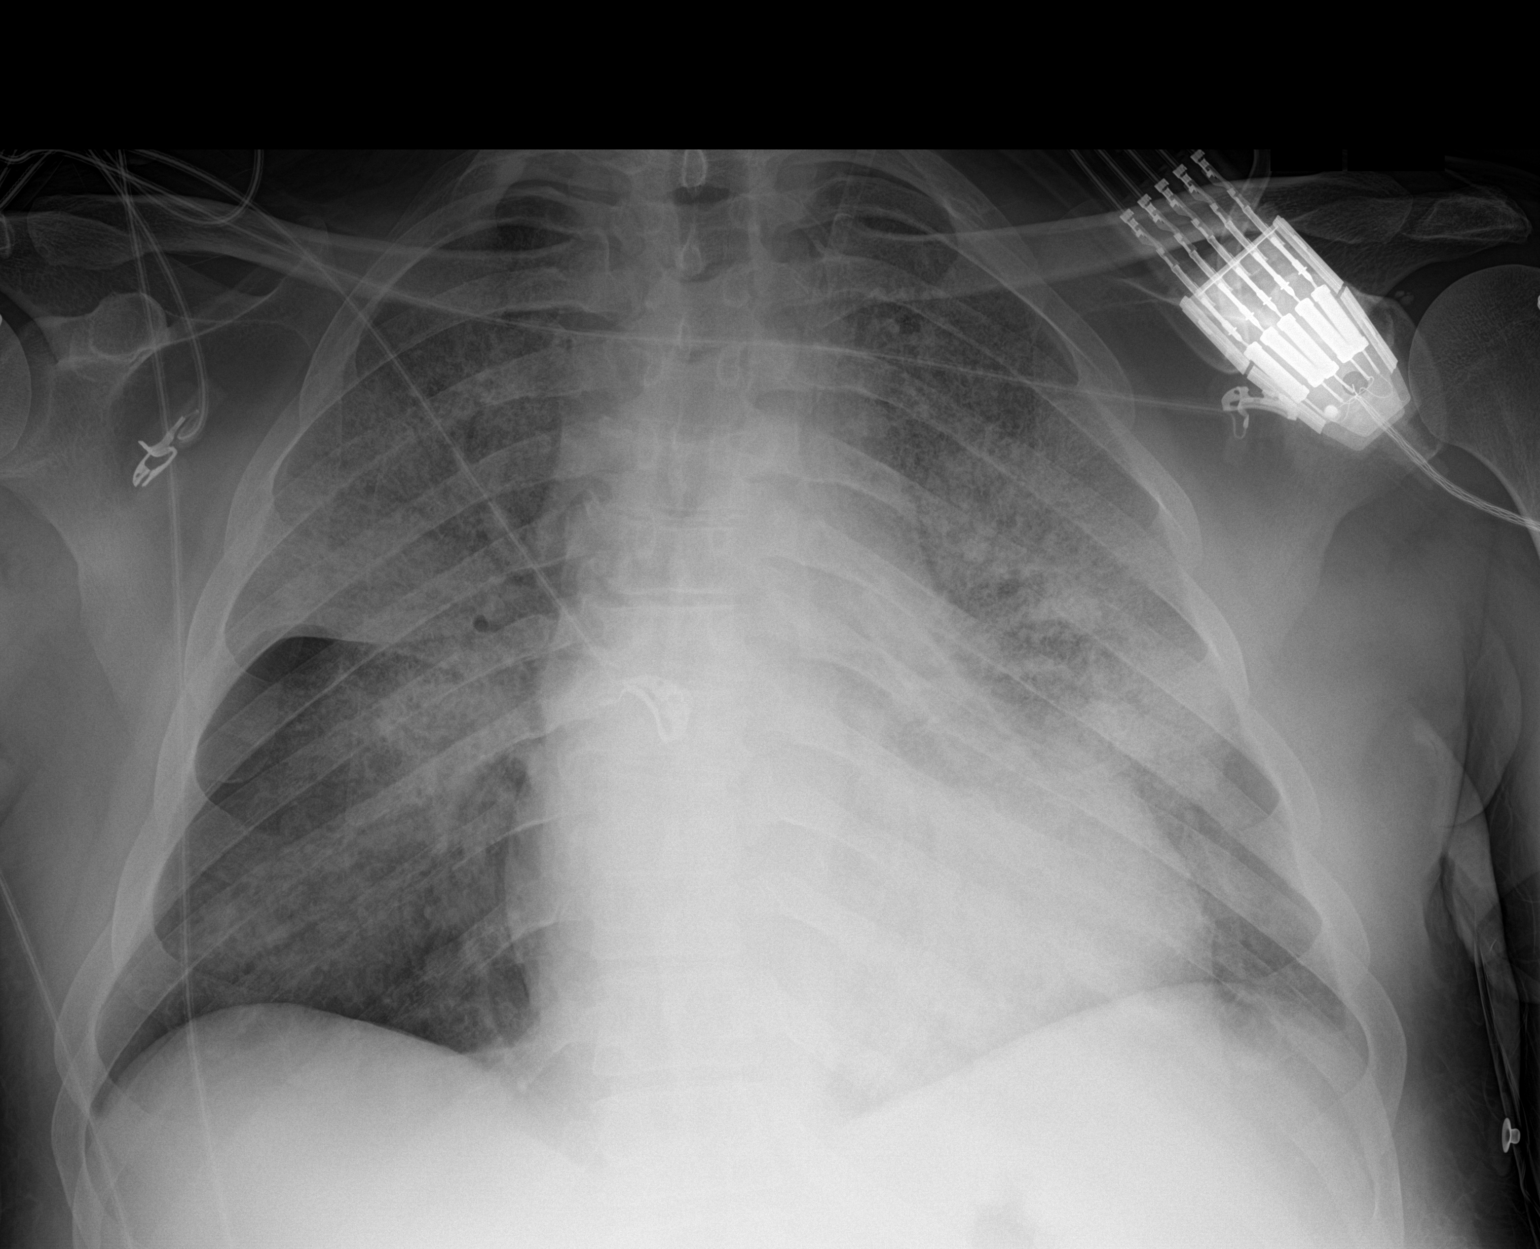

[1 of 1 positions shown; findings below may reference images not displayed]

FINDINGS: Predominant stable moderate to marked severity infiltrates are again
seen, bilaterally. This is increased in severity when compared to
the prior study. There is no evidence of a pleural effusion or
pneumothorax. The cardiac silhouette is borderline in size. The
visualized skeletal structures are unremarkable.
IMPRESSION: Stable moderate to marked severity bilateral infiltrates.

## 2021-03-05 MED ORDER — ETOMIDATE 2 MG/ML IV SOLN: 25.0000 mg | Freq: Once | INTRAVENOUS | Status: AC

## 2021-03-05 MED ORDER — PROPOFOL 500 MG/50ML IV EMUL
INTRAVENOUS | Status: AC
  Filled 2021-03-05: qty 50

## 2021-03-05 MED ORDER — ROCURONIUM BROMIDE 50 MG/5ML IV SOLN
70.0000 mg | Freq: Once | INTRAVENOUS | Status: AC
  Filled 2021-03-05: qty 7

## 2021-03-05 MED ORDER — SODIUM CHLORIDE 0.9 % IV SOLN: INTRAVENOUS | Status: DC

## 2021-03-05 MED ORDER — VECURONIUM BROMIDE 10 MG IV SOLR
INTRAVENOUS | Status: AC
  Filled 2021-03-05: qty 10

## 2021-03-05 MED ORDER — DEXTROSE 10 % IV SOLN: INTRAVENOUS | Status: DC

## 2021-03-05 MED ORDER — CHLORHEXIDINE GLUCONATE CLOTH 2 % EX PADS
6.0000 | MEDICATED_PAD | Freq: Every day | CUTANEOUS | Status: DC
  Administered 2021-03-05 – 2021-03-13 (×10): 6 via TOPICAL

## 2021-03-05 MED ORDER — HYDROCORTISONE NA SUCCINATE PF 100 MG IJ SOLR: 50.0000 mg | Freq: Four times a day (QID) | INTRAMUSCULAR | Status: DC

## 2021-03-05 MED ORDER — DEXTROSE 50 % IV SOLN
INTRAVENOUS | Status: AC
  Administered 2021-03-05: 50 mL via INTRAVENOUS
  Filled 2021-03-05: qty 50

## 2021-03-05 MED ORDER — FENTANYL CITRATE (PF) 100 MCG/2ML IJ SOLN
INTRAMUSCULAR | Status: AC
  Administered 2021-03-05: 100 ug via INTRAVENOUS
  Filled 2021-03-05: qty 2

## 2021-03-05 MED ORDER — NOREPINEPHRINE 4 MG/250ML-% IV SOLN
INTRAVENOUS | Status: AC
  Administered 2021-03-05: 10 ug/min
  Filled 2021-03-05: qty 250

## 2021-03-05 MED ORDER — ETOMIDATE 2 MG/ML IV SOLN
INTRAVENOUS | Status: AC
  Administered 2021-03-05: 25 mg via INTRAVENOUS
  Filled 2021-03-05: qty 20

## 2021-03-05 MED ORDER — FENTANYL CITRATE (PF) 100 MCG/2ML IJ SOLN: 100.0000 ug | Freq: Once | INTRAMUSCULAR | Status: AC

## 2021-03-05 MED ORDER — LIDOCAINE HCL (CARDIAC) PF 100 MG/5ML IV SOSY
PREFILLED_SYRINGE | INTRAVENOUS | Status: AC
  Filled 2021-03-05: qty 5

## 2021-03-05 MED ORDER — NOREPINEPHRINE 4 MG/250ML-% IV SOLN: 0.0000 ug/min | INTRAVENOUS | Status: DC

## 2021-03-05 MED ORDER — STERILE WATER FOR INJECTION IJ SOLN
INTRAMUSCULAR | Status: AC
  Filled 2021-03-05: qty 10

## 2021-03-05 MED ORDER — DEXTROSE-NACL 5-0.9 % IV SOLN: INTRAVENOUS | Status: DC

## 2021-03-05 MED ORDER — LACTATED RINGERS IV BOLUS
1000.0000 mL | Freq: Once | INTRAVENOUS | Status: AC
  Administered 2021-03-05: 1000 mL via INTRAVENOUS

## 2021-03-05 MED ORDER — SODIUM CHLORIDE 0.9 % IV SOLN
250.0000 mL | INTRAVENOUS | Status: DC
  Administered 2021-03-06 – 2021-03-21 (×3): 250 mL via INTRAVENOUS

## 2021-03-05 MED ORDER — DEXTROSE 50 % IV SOLN
1.0000 | Freq: Once | INTRAVENOUS | Status: AC
  Administered 2021-03-05: 50 mL via INTRAVENOUS
  Filled 2021-03-05: qty 50

## 2021-03-05 MED ORDER — SODIUM ZIRCONIUM CYCLOSILICATE 10 G PO PACK
10.0000 g | PACK | Freq: Two times a day (BID) | ORAL | Status: DC
  Administered 2021-03-05: 10 g via ORAL
  Filled 2021-03-05: qty 1

## 2021-03-05 MED ORDER — SUCCINYLCHOLINE CHLORIDE 200 MG/10ML IV SOSY
PREFILLED_SYRINGE | INTRAVENOUS | Status: AC
  Filled 2021-03-05: qty 10

## 2021-03-05 MED ORDER — NOREPINEPHRINE 4 MG/250ML-% IV SOLN
2.0000 ug/min | INTRAVENOUS | Status: DC
  Administered 2021-03-06: 10 ug/min via INTRAVENOUS
  Administered 2021-03-06: 15 ug/min via INTRAVENOUS
  Filled 2021-03-05: qty 250

## 2021-03-05 MED ORDER — FENTANYL 2500MCG IN NS 250ML (10MCG/ML) PREMIX INFUSION
0.0000 ug/h | INTRAVENOUS | Status: DC
  Administered 2021-03-06: 25 ug/h via INTRAVENOUS
  Administered 2021-03-06 – 2021-03-08 (×6): 400 ug/h via INTRAVENOUS
  Administered 2021-03-08: 300 ug/h via INTRAVENOUS
  Administered 2021-03-08 (×2): 400 ug/h via INTRAVENOUS
  Administered 2021-03-09 (×2): 375 ug/h via INTRAVENOUS
  Administered 2021-03-09: 400 ug/h via INTRAVENOUS
  Administered 2021-03-09: 300 ug/h via INTRAVENOUS
  Administered 2021-03-10 – 2021-03-14 (×19): 400 ug/h via INTRAVENOUS
  Administered 2021-03-15: 200 ug/h via INTRAVENOUS
  Administered 2021-03-15: 400 ug/h via INTRAVENOUS
  Administered 2021-03-15: 250 ug/h via INTRAVENOUS
  Administered 2021-03-16: 200 ug/h via INTRAVENOUS
  Administered 2021-03-16: 125 ug/h via INTRAVENOUS
  Administered 2021-03-17: 150 ug/h via INTRAVENOUS
  Administered 2021-03-17: 100 ug/h via INTRAVENOUS
  Filled 2021-03-05 (×41): qty 250

## 2021-03-05 MED ORDER — INSULIN ASPART 100 UNIT/ML IV SOLN
10.0000 [IU] | Freq: Once | INTRAVENOUS | Status: AC
  Administered 2021-03-05: 10 [IU] via INTRAVENOUS
  Filled 2021-03-05: qty 0.1

## 2021-03-05 MED ORDER — FLUCONAZOLE IN SODIUM CHLORIDE 200-0.9 MG/100ML-% IV SOLN
200.0000 mg | INTRAVENOUS | Status: DC
  Administered 2021-03-05: 200 mg via INTRAVENOUS
  Filled 2021-03-05: qty 100

## 2021-03-05 MED ORDER — DEXTROSE 50 % IV SOLN
INTRAVENOUS | Status: AC
  Administered 2021-03-05: 25 mL
  Filled 2021-03-05: qty 50

## 2021-03-05 MED ORDER — IPRATROPIUM-ALBUTEROL 0.5-2.5 (3) MG/3ML IN SOLN
3.0000 mL | RESPIRATORY_TRACT | Status: DC
  Administered 2021-03-06 – 2021-03-20 (×90): 3 mL via RESPIRATORY_TRACT
  Filled 2021-03-05 (×59): qty 3
  Filled 2021-03-05: qty 30
  Filled 2021-03-05 (×28): qty 3

## 2021-03-05 MED ORDER — ROCURONIUM BROMIDE 10 MG/ML (PF) SYRINGE
PREFILLED_SYRINGE | INTRAVENOUS | Status: AC
  Administered 2021-03-05: 70 mg via INTRAVENOUS
  Filled 2021-03-05: qty 10

## 2021-03-05 MED ORDER — MIDAZOLAM HCL 2 MG/2ML IJ SOLN
INTRAMUSCULAR | Status: AC
  Filled 2021-03-05: qty 2

## 2021-03-05 NOTE — Plan of Care (Signed)
Called about patient with worsening hypoxia overnight. Patient tachypneic and sats in 70's on non rebreather and high flow nasal canula. CXR with continued bilateral pna. Would not recommend BiPAP. Spoke with patient and he is agreeable to elective intubation. Patient intubated with 7.5 tube with 1 attempt. ABG and CXR ordered after. Have put in for levo given periprocedural hypotension.

## 2021-03-05 NOTE — ED Notes (Signed)
Secure message sent to Theodoro Doing, RN in regards to having a room assignment to 240

## 2021-03-05 NOTE — Consult Note (Addendum)
[facility] for Infectious Disease    Date of Admission:  03/04/2021     Reason for Consult:HIV, pneumonia     Current antibiotics: Cefepime 6/30--present Vancomycin 6/30--present Clindamycin 6/30--present Primaquine 6/30--present Fluconazole 7/1--present Methylprednisolone 6/30--present    ASSESSMENT:    Hypoxemic respiratory failure: With concern for PCP pneumonia in patient with advanced HIV and elevated LDH versus other atypical infection versus typical pathogens versus pulmonary edema.  Currently on broad-spectrum antimicrobial coverage including coverage for PCP pneumonia. Advanced HIV: Has been restarted on ART Candida esophagitis: Restarted on fluconazole Acute renal failure: Renal ultrasound unremarkable.  Nephrology consult pending. ?  Possible HIVAN Type 2 diabetes complicated by neuropathy: Recent hemoglobin A1c 5.9 Elevated LFTs: Improving.  Recent hepatitis panel negative.  PLAN:    Continue cefepime and vancomycin for now Continue PCP treatment with primaquine and clindamycin.  Continue steroids in the setting of hypoxemia Continue fluconazole for Candida esophagitis Has been resumed on lamivudine and dolutegravir.  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 ([facility])   Elevated LFTs   MEDICATIONS:    Scheduled Meds:  Chlorhexidine Gluconate Cloth  6 each Topical Daily   dolutegravir  50 mg Oral Daily   heparin  5,000 Units Subcutaneous Q8H   insulin aspart  1-3 Units Subcutaneous Q4H   lamiVUDine   150 mg Oral Daily   methylPREDNISolone (SOLU-MEDROL) injection  40 mg Intravenous Q24H   pantoprazole (PROTONIX) IV  40 mg Intravenous QHS   primaquine  30 mg Oral Q24H   sodium zirconium cyclosilicate  10 g Oral BID   vancomycin variable dose per unstable renal function (pharmacist dosing)   Does not apply See admin instructions   Continuous Infusions:  sodium chloride 125 mL/hr at 03/05/21 1144   ceFEPime (MAXIPIME) IV Stopped (03/04/21 2249)   clindamycin (CLEOCIN) IV 900 mg (03/05/21 0731)   fluconazole (DIFLUCAN) IV     PRN Meds:.acetaminophen, albuterol, docusate sodium, guaiFENesin, ondansetron (ZOFRAN) IV, polyethylene glycol  HPI:    Bruce Hunter is a 45 y.o. male with advanced HIV disease who was recently admitted to the hospital 6/9 to 02/15/2021 due to concern for hypoglycemia and at that time was also found to have advanced HIV disease with a CD4 count of less than 35 and viral load of 2.7 million copies.  During that admission, he underwent EGD which was concerning for possible Candida esophagitis.  It was recommended that he take fluconazole for 14 to 21 days at discharge.  During the admission he was also started on lamivudine and dolutegravir.  He had a follow-up appointment with Terri Piedra on 02/19/2021 at [facility].  It was unclear if he was taking his medication at that time.  He then presented last night to the emergency department with shortness of breath and acute hypoxemic respiratory failure.  He reported respiratory symptoms since he was discharged about 2 weeks prior.  He had not been taking any of his antiretrovirals since discharge.  He was started on broad-spectrum antibiotics with vancomycin and cefepime.  He was also started  on PCP treatment with primaquine and clindamycin as well as steroids.  Bactrim was not given due to his acute kidney injury present on admission.  Baseline creatinine appears to be about 2-3 and he was admitted with a creatinine of 5.6.  Chest x-ray  showed diffuse lung airspace disease compatible with multifocal pneumonia versus asymmetric edema.  Renal ultrasound was obtained which showed normal-appearing kidneys bilaterally.  A transthoracic echocardiogram is scheduled for later today.  He was initiated on supplemental oxygen with nonrebreather and is now currently on 25 L/min of high flow nasal cannula and satting about 92%.  He denies any fevers or chills, no GI complaints, or urinary complaints.  He endorses cough and worsening shortness of breath.  He states that his esophagitis symptoms have improved.  We have been consulted for further recommendations.  He has also been restarted on his antiretrovirals since admission and also continued on fluconazole.   Past Medical History:  Diagnosis Date   Asthma    Diabetes mellitus without complication ([place])    Gout    Hyperlipidemia    Hypertension    Obesity     Social History   Tobacco Use   Smoking status: Every Day    Packs/day: 0.25    Pack years: 0.00    Types: Cigarettes   Smokeless tobacco: Never  Substance Use Topics   Alcohol use: Yes   Drug use: Yes    Frequency: 1.0 times per week    Types: Marijuana    Comment: marijuana at midnight last night; daily    Family History  Problem Relation Age of Onset   Asthma Mother    Cancer Mother    Diabetes Mother    Obesity Mother    Obesity Sister    Diabetes Brother    Obesity Brother    Cancer Maternal Grandmother    Diabetes Maternal Grandmother    Diabetes Other     No Known Allergies  Review of Systems  All other systems reviewed and are negative. Except as noted in HPI.   OBJECTIVE:   Blood pressure 98/61, pulse 93, temperature (!) 97.5 F (36.4 C), temperature source Oral, resp. rate 20, height '5\' 10"'$  (1.778 m), weight 90.7 kg, SpO2 98 %. Body mass index is 28.7 kg/m.  Physical Exam Constitutional:      General: He is not in acute distress.    Appearance: Normal appearance.  HENT:     Head:  Normocephalic and atraumatic.     Mouth/Throat:     Comments: Poor dentition.  No obvious oral thrush. Eyes:     Extraocular Movements: Extraocular movements intact.     Conjunctiva/sclera: Conjunctivae normal.  Cardiovascular:     Rate and Rhythm: Normal rate and regular rhythm.  Pulmonary:     Comments: On HFNC.  No acute distress.  Normal effort.  Abdominal:     General: There is no distension.     Palpations: Abdomen is soft.     Tenderness: There is no abdominal tenderness.  Musculoskeletal:        General: Normal range of motion.     Cervical back: Normal range of motion and neck supple.  Skin:    General: Skin is warm and dry.     Coloration: Skin is not jaundiced.     Findings: No rash.  Neurological:     General: No focal deficit present.     Mental Status: He is alert and oriented to person, place, and time.  Psychiatric:  Mood and Affect: Mood normal.        Behavior: Behavior normal.     Lab Results: Lab Results  Component Value Date   WBC 24.4 (H) 03/05/2021   HGB 7.1 (L) 03/05/2021   HCT 24.0 (L) 03/05/2021   MCV 97.2 03/05/2021   PLT 112 (L) 03/05/2021    Lab Results  Component Value Date   NA 136 03/05/2021   K 5.2 (H) 03/05/2021   CO2 18 (L) 03/05/2021   GLUCOSE 128 (H) 03/05/2021   BUN 93 (H) 03/05/2021   CREATININE 5.50 (H) 03/05/2021   CALCIUM 7.9 (L) 03/05/2021   GFRNONAA 12 (L) 03/05/2021   GFRAA >60 05/29/2011    Lab Results  Component Value Date   ALT 26 03/05/2021   AST 54 (H) 03/05/2021   ALKPHOS 145 (H) 03/05/2021   BILITOT 0.7 03/05/2021    No results found for: CRP  No results found for: ESRSEDRATE  I have reviewed the micro and lab results in Epic.  Imaging: US RENAL  Result Date: 03/05/2021 CLINICAL DATA:  Acute renal injury EXAM: RENAL / URINARY TRACT ULTRASOUND COMPLETE COMPARISON:  02/12/2021 FINDINGS: Right Kidney: Renal measurements: 11.6 x 5.6 x 5.1 cm. = volume: 172 mL. Echogenicity within normal limits. No  mass or hydronephrosis visualized. Left Kidney: Renal measurements: 14.1 x 7.2 x 7.1 cm. = volume: 373 mL. Echogenicity within normal limits. No mass or hydronephrosis visualized. Bladder: Appears normal for degree of bladder distention. Other: None. IMPRESSION: Normal appearing kidneys bilaterally. Electronically Signed   By: Inez Catalina M.D.   On: 03/05/2021 11:34   DG Chest Port 1 View  Result Date: 03/04/2021 CLINICAL DATA:  Shortness of breath EXAM: PORTABLE CHEST 1 VIEW COMPARISON:  02/13/2021 FINDINGS: Diffuse airspace disease throughout the left lung and also in the right upper lobe/perihilar region. Heart is mildly enlarged. No effusions or pneumothorax. No acute bony abnormality. IMPRESSION: Diffuse left lung airspace disease and airspace disease in the right upper lobe. Findings most compatible with multifocal pneumonia. This could conceivably represent asymmetric edema. Electronically Signed   By: Rolm Baptise M.D.   On: 03/04/2021 19:09     Imaging independently reviewed in Epic.  Rayn[facility] for [facility]ious [facility] [phone] pager 03/05/2021, 12:12 PM

## 2021-03-05 NOTE — Progress Notes (Signed)
PHARMACY NOTE:  ANTIMICROBIAL RENAL DOSAGE ADJUSTMENT  Current antimicrobial regimen includes a mismatch between antimicrobial dosage and estimated renal function.  As per policy approved by the Pharmacy & Therapeutics and Medical Executive Committees, the antimicrobial dosage will be adjusted accordingly.  Current antimicrobial dosage:  Fluconazole '400mg'$  daily   Indication: esophagitis   Renal Function:  Estimated Creatinine Clearance: 19.4 mL/min (A) (by C-G formula based on SCr of 5.5 mg/dL (H)). '[]'$      On intermittent HD, scheduled: '[]'$      On CRRT    Antimicrobial dosage has been changed to:  fluconazole '200mg'$  daily     Thank you for allowing pharmacy to be a part of this patient's care.  Phillis Haggis, [facility] 03/05/2021 8:11 AM

## 2021-03-05 NOTE — Progress Notes (Signed)
eLink Physician-Brief Progress Note Patient Name: Bruce Hunter DOB: 07/11/1976 MRN: [id number]   Date of Service  03/05/2021  HPI/Events of Note  Hypoxemia - CXR looks like progression of pneumonia to my reading.   eICU Interventions  Will request the PCCM ground team evaluate the patient at bedside.      Intervention Category Major Interventions: Hypoxemia - evaluation and management  Codylee Patil Eugene 03/05/2021, 10:29 PM

## 2021-03-05 NOTE — ED Notes (Signed)
Dextrose 25 gm given IVP for blood glucose of 53.  Patient is sitting on the bedside.  NRB in place 15 liters

## 2021-03-05 NOTE — Progress Notes (Addendum)
eLink Physician-Brief Progress Note Patient Name: Bruce Hunter DOB: 02-13-1976 MRN: [id number]   Date of Service  03/05/2021  HPI/Events of Note  Hypoxia - Sat = 81% and RR 35 on HFNC O2. Hypoglycemia - Blood glucose = 65 post D50   eICU Interventions  Plan: Portable CXR STAT. D10W to run IV at 50 mL/hour. Decrease D5 0.9 NaCl to 25 mL/hour.     Intervention Category Major Interventions: Hypoxemia - evaluation and management;Other:  [name] 03/05/2021, 10:04 PM

## 2021-03-05 NOTE — ED Notes (Signed)
Patient noted to have thick, tenacious sputum, with difficulty expectorating sputum. Sputum noted to be tan/green.

## 2021-03-05 NOTE — Progress Notes (Addendum)
NAME:  Bruce Hunter, MRN:  [id number], DOB:  24-Jan-1976, LOS: 1 ADMISSION DATE:  03/04/2021, CONSULTATION DATE:  03/04/21 REFERRING MD:  Melina Copa, CHIEF COMPLAINT:  Shortness of breath.     Brief Narrative:  45 yo man with HIV/AIDS here with acute hypoxemic respiratory failure.  Complaints of SOB and cough productive of tan sputum.  Has had respiratory symptoms since he was discharged earlier this month (6/13).     Recently diagnosed with HIV/AIDS, started on medications, but patient tells me he "hasn't had time to start taking them" but he isn't really able to explain further.  Patient was initiated on lamivudine, Tivicay, dapsone and fluconazole on discharge.  Patient does have an appointment with infectious disease as outpatient.  Candida esophagitis. At the time.    ID note 6/17:  DTG plus lamivudine. He can get the combined pill, dovato, upon discharge. 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 rate (!) 24, height '5\' 10"'$  (1.778 m), weight 90.7 kg, SpO2 100 %.        Intake/Output Summary (Last 24 hours) at 03/05/2021 0936 Last data filed at 03/05/2021 0250 Gross per 24 hour  Intake 451.76 ml  Output --  Net 451.76 ml   Filed Weights   03/04/21 1807  Weight: 90.7 kg    Examination: Per attending   Resolved Hospital Problem list     Assessment & Plan:  PNA, immunocompromised in the setting of HIV/AIDS -Concern for PJP pneumonia  P: Continue supplemental oxygent with SPO2 goal 92 and above  Head of bed  elevated 30 degrees Follow intermittent chest x-ray and ABG Ensure adequate pulmonary hygiene  Follow cultures Antibiotics as below  BDs  RVP pending   Sepsis P: Supplemental oxygen Follow pan cultures IV Cefepime, Clindamycin, and vancomycin  Aggressive IV hydration provided on admit   Trend lactic acid Procalcitonin pending  Continue stress dose steroids  ECHO   AIDS/HIV Low CD 4, HIV viral load >1.47 mil P: ID following, appreciate assistance Continue Dolutegravir and Lamiviudine  AKI on CKD -last Creatinines in  2-3 range Hyperkalemia T waves, ekg ok P: Follow renal function Monitor urine output Trend Bmet Avoid nephrotoxins Ensure adequate renal perfusion  S/P temporizing measures and lokelma   LE edema:  Hypoalbuminemia, nephrotic syndrome? P: ECHO pending  Follow UA  Elevated LFTs  -Elevated AP, AST 70, ALT 32,  P: Trend LFTs Avoid hepatotoxins   Hx of HTN  Home medications include Norvasc, HCTZ, Lisinopril,  P: Hold home meds  Continuous telemetry   Type 2 diabetes Neuropathic pain: in feet P: SSI  CBG checks q4 Continue Gabapentin  Best Practice    Diet/type: NPO w/ oral meds DVT prophylaxis: prophylactic heparin  GI prophylaxis: PPI Lines: N/A Foley:  N/A Code Status:  full code Last date of multidisciplinary goals of care discussion   Critical care time: NA   Raine Blodgett D. Kenton Kingfisher, NP-C [facility] Personal contact information can be found on Amion  03/05/2021, 9:53 AM

## 2021-03-05 NOTE — Consult Note (Signed)
Renal Service Consult Note [facility]  Bruce Hunter 03/05/2021 [name], MD Requesting Physician: Dr D. [place]  Reason for Consult: Renal failure  HPI: The patient is a 45 y.o. year-old w/ hx of asthma, Obestiy, HL, HTN, gout and recently diagnosed advanced HIV disease w/ CD4 count < 35 during admit in early June 2022. During that admit pt was dx'd w/ candida esophagitis, given diflucan. Was started on HIV meds. Then last night pt presented to ED w/ resp distress. He has been started here on IV abx w/ vanc and cefepime, also primaquine and clindamycin w/ steroids for suspected PCP. Baseline creat is 2-3 range, here the creat is in the 5-6 range.  Asked to see for renal failure.   Pt seen in ED, alert and coherent. Denies any voiding issues, change in urine color.  BP's in ED are soft in the 80's - 90's mostly. WBC 24k.  CXR shows multifocal pna.     ROS - denies CP, no joint pain, no HA, no blurry vision, no rash, no diarrhea, no nausea/ vomiting, no dysuria, no difficulty voiding   Past Medical History  Past Medical History:  Diagnosis Date   Asthma    Diabetes mellitus without complication ([place])    Gout    Hyperlipidemia    Hypertension    Obesity    Past Surgical History  Past Surgical History:  Procedure Laterality Date   BIOPSY  02/15/2021   Procedure: BIOPSY;  Surgeon: Ronnette Juniper, MD;  Location: [facility];  Service: Gastroenterology;;   COLONOSCOPY WITH PROPOFOL N/A 02/15/2021   Procedure: COLONOSCOPY WITH PROPOFOL;  Surgeon: Ronnette Juniper, MD;  Location: [facility];  Service: Gastroenterology;  Laterality: N/A;   ESOPHAGOGASTRODUODENOSCOPY (EGD) WITH PROPOFOL N/A 02/15/2021   Procedure: ESOPHAGOGASTRODUODENOSCOPY (EGD) WITH PROPOFOL;  Surgeon: Ronnette Juniper, MD;  Location: [facility];  Service: Gastroenterology;  Laterality: N/A;   GANGLION CYST EXCISION Right 02/26/2020   Procedure: EXCISION TOPHUS RIGHT FOOT;  Surgeon: Evelina Bucy, DPM;   Location: [place];  Service: Podiatry;  Laterality: Right;   NO PAST SURGERIES     Family History  Family History  Problem Relation Age of Onset   Asthma Mother    Cancer Mother    Diabetes Mother    Obesity Mother    Obesity Sister    Diabetes Brother    Obesity Brother    Cancer Maternal Grandmother    Diabetes Maternal Grandmother    Diabetes Other    Social History  reports that he has been smoking cigarettes. He has been smoking an average of 0.25 packs per day. He has never used smokeless tobacco. He reports current alcohol use. He reports current drug use. Frequency: 1.00 time per week. Drug: Marijuana. Allergies No Known Allergies Home medications Prior to Admission medications   Medication Sig Start Date End Date Taking? Authorizing Provider  amLODipine (NORVASC) 10 MG tablet Take 10 mg by mouth daily.   Yes [provider]  clonazePAM (KLONOPIN) 0.5 MG tablet Take 0.5 mg by mouth 3 (three) times daily as needed for anxiety. 10/28/20  Yes [provider]  dapsone 100 MG tablet Take 1 tablet (100 mg total) by mouth daily. 02/16/21  Yes Pokhrel, Laxman, MD  Dolutegravir-lamiVUDine (DOVATO PO) Take 50 mg by mouth daily.   Yes [provider]  fluconazole (DIFLUCAN) 100 MG tablet Take 100 mg by mouth daily. 02/26/21  Yes [provider]  folic acid (FOLVITE) 1 MG tablet  Take 1 tablet (1 mg total) by mouth daily. 02/16/21  Yes Pokhrel, Laxman, MD  gabapentin (NEURONTIN) 600 MG tablet Take 600 mg by mouth 3 (three) times daily. 03/20/20  Yes [provider]  hydrochlorothiazide (HYDRODIURIL) 25 MG tablet Take 25 mg by mouth daily. 08/31/20  Yes [provider]  HYDROcodone-acetaminophen (NORCO/VICODIN) 5-325 MG tablet Take 1 tablet by mouth 2 (two) times daily as needed for severe pain. 10/28/20  Yes [provider]  hydrocortisone 2.5 % cream Apply 1 application topically 2 (two) times daily as needed  (itching). 11/06/20  Yes [provider]  lisinopril (ZESTRIL) 20 MG tablet Take 20 mg by mouth daily. 10/06/20  Yes [provider]  meloxicam (MOBIC) 15 MG tablet TAKE 1 TABLET(15 MG) BY MOUTH DAILY Patient taking differently: Take 15 mg by mouth daily. 09/14/20  Yes Evelina Bucy, DPM  miconazole (MICOTIN) 2 % cream Apply 1 application topically daily. 06/26/20  Yes [provider]  MITIGARE 0.6 MG CAPS Take 1 capsule by mouth daily. 11/09/20  Yes [provider]  omeprazole (PRILOSEC) 20 MG capsule Take 1 capsule (20 mg total) by mouth daily. 02/15/21  Yes Pokhrel, Laxman, MD  sodium bicarbonate 650 MG tablet Take 1 tablet (650 mg total) by mouth 3 (three) times daily. 02/15/21 03/17/21 Yes Pokhrel, Laxman, MD  traMADol (ULTRAM) 50 MG tablet Take 50 mg by mouth every 6 (six) hours as needed for moderate pain.   Yes [provider]  vitamin B-12 1000 MCG tablet Take 1 tablet (1,000 mcg total) by mouth daily. 02/16/21  Yes Pokhrel, Laxman, MD  zolpidem (AMBIEN) 5 MG tablet Take 5 mg by mouth at bedtime as needed for sleep. 07/09/20  Yes [provider]  ACCU-CHEK GUIDE test strip 2 (two) times daily. 09/24/20   [provider]  Accu-Chek Softclix Lancets lancets 2 (two) times daily. 09/30/20   [provider]  dolutegravir (TIVICAY) 50 MG tablet Take 1 tablet (50 mg total) by mouth daily. Patient not taking: No sig reported 02/16/21   Pokhrel, Corrie Mckusick, MD  lamiVUDine (EPIVIR) 300 MG tablet Take 1 tablet (300 mg total) by mouth daily. Patient not taking: No sig reported 02/16/21   Pokhrel, Corrie Mckusick, MD  ondansetron (ZOFRAN) 4 MG tablet Take 1 tablet (4 mg total) by mouth every 6 (six) hours as needed for nausea or vomiting. Patient not taking: No sig reported 02/15/21   Flora Lipps, MD     Vitals:   03/05/21 1100 03/05/21 1115 03/05/21 1200 03/05/21 1441  BP: (!) 97/58  98/61   Pulse: 92 91 93 97  Resp: _0 (!) 21  Temp:    97.6 F (36.4 C)   TempSrc:   Oral   SpO2: 92% 92% 98% 94%  Weight:      Height:       Exam Gen alert, no distress, lying on the stretcher No rash, cyanosis or gangrene Sclera anicteric, throat clear  No jvd or bruits Chest ctab  RRR no MRG Abd soft ntnd no mass or ascites +bs GU normal MS no joint effusions or deformity Ext mild nonpitting in feet bilat, no wounds or ulcers Neuro is alert, Ox 3 , nf       Home meds:  - norvasc/ hctz/ lisinopril   - epivir/ dolutegravir/ lamivudine/ dapsone  - klonopin 0.5 tid prn/ neurontin 600 tid/ norvco prn/ mobic 15 qd/ ultram prn/ ambien prn  - prilosec /diflucan  - prn's/ vitamins/ supplements  UA 6/30 - negative    UNa 52,  UCr 150     Renal US - 11 and 14 cm kidneys w/o hydro           Date  Creat  eGFR     June 2021  2.80       June 2022  4.9 >> 2.74 16- 30 ml/min    Assessment/ Plan: AKI on CKD IV - b/l creat from June 2022 is 2.5- 3.0.  Creat here is 5.5 in setting of acute resp distress/ multifocal pna/ WBC 24k, probable pneumonia, which may be HIV related. Pt is on ACEi and mobic at home, suspect volume depletion / hypotension/ acei/ COX II inhibitor causing hemodynamic AKI or ATN. Renal US nonobstructive, UA negative. Agree w/ IVF"s and holding acei/ nsaid/ COX II agents.  Will follow.  Pneumonia - on multple IV abx per CCM and ID.  HIV - recently diagnosed and quite advanced HTN - BP's low here, getting IVF's and IV abx, holding home meds, no acei/ arb w/ AKI.       Kelly Splinter  MD 03/05/2021, 3:21 PM  Recent Labs  Lab 03/05/21 0130 03/05/21 0415  WBC 26.5* 24.4*  HGB 7.0* 7.1*   Recent Labs  Lab 03/05/21 0130 03/05/21 0415 03/05/21 0709 03/05/21 1131  K 6.1* 6.2* 5.2* 4.8  BUN 92* 93*  --   --   CREATININE 5.50* 5.50*  --   --   CALCIUM 7.8* 7.9*  --   --

## 2021-03-05 NOTE — ED Notes (Signed)
CBG recheck- resulted 53-

## 2021-03-05 NOTE — Progress Notes (Signed)
eLink Physician-Brief Progress Note Patient Name: Bruce Hunter DOB: 05-30-1976 MRN: [id number]   Date of Service  03/05/2021  HPI/Events of Note  Nursing request for CXR to verify reposition of ETT.   eICU Interventions  Plan: Portable CXR STAT.     Intervention Category Major Interventions: Other:  [name] 03/05/2021, 11:59 PM

## 2021-03-05 NOTE — ED Notes (Signed)
Patient sitting on the bedside- NRB in place with 15 liters.  Patient is coughing frequently.  Alert and oriented.  Answers all questions appropriately

## 2021-03-05 NOTE — Progress Notes (Signed)
  Echocardiogram 2D Echocardiogram has been performed.  Bruce Hunter 03/05/2021, 1:14 PM

## 2021-03-05 NOTE — ED Notes (Signed)
Blood glucose 55- patient was given 2 small cartons of OJ and apple sauce

## 2021-03-05 NOTE — ED Notes (Signed)
Patient having difficulty keeping BP cuff in place. BP cuff placed back on patient.

## 2021-03-05 NOTE — ED Notes (Signed)
Patient is ready for admission to ICU

## 2021-03-05 NOTE — Procedures (Signed)
Intubation Procedure Note  JEFRY KRUCKEBERG  [id number]  1976/04/09  Date:03/05/21  Time:11:26 PM   Provider Performing:Tim Wilhide E Orpah Melter    Procedure: Intubation (31500)  Indication(s) Respiratory Failure  Consent Risks of the procedure as well as the alternatives and risks of each were explained to the patient and/or caregiver.  Consent for the procedure was obtained and is signed in the bedside chart   Anesthesia Etomidate and Rocuronium   Time Out Verified patient identification, verified procedure, site/side was marked, verified correct patient position, special equipment/implants available, medications/allergies/relevant history reviewed, required imaging and test results available.   Sterile Technique Usual hand hygeine, masks, and gloves were used   Procedure Description Patient positioned in bed supine.  Sedation given as noted above.  Patient was intubated with endotracheal tube using Glidescope.  View was Grade 1 full glottis .  Number of attempts was 1.  Colorimetric CO2 detector was consistent with tracheal placement.   Complications/Tolerance None; patient tolerated the procedure well. Chest X-ray is ordered to verify placement.   EBL Minimal   Specimen(s) None

## 2021-03-05 NOTE — Progress Notes (Signed)
Fluid bolus for hypotension Hypoglycemia related to insulin given for hyper-K as well as not eating  Erskine Emery MD PCCM

## 2021-03-06 ENCOUNTER — Inpatient Hospital Stay (HOSPITAL_COMMUNITY): Payer: Medicaid Other

## 2021-03-06 DIAGNOSIS — N179 Acute kidney failure, unspecified: Secondary | ICD-10-CM | POA: Diagnosis not present

## 2021-03-06 DIAGNOSIS — B20 Human immunodeficiency virus [HIV] disease: Secondary | ICD-10-CM

## 2021-03-06 DIAGNOSIS — J9601 Acute respiratory failure with hypoxia: Secondary | ICD-10-CM

## 2021-03-06 DIAGNOSIS — B59 Pneumocystosis: Secondary | ICD-10-CM | POA: Diagnosis not present

## 2021-03-06 DIAGNOSIS — B3781 Candidal esophagitis: Secondary | ICD-10-CM | POA: Diagnosis not present

## 2021-03-06 LAB — LEGIONELLA PNEUMOPHILA SEROGP 1 UR AG: L. pneumophila Serogp 1 Ur Ag: NEGATIVE

## 2021-03-06 LAB — GASTROINTESTINAL PANEL BY PCR, STOOL (REPLACES STOOL CULTURE)

## 2021-03-06 LAB — RENAL FUNCTION PANEL
Albumin: 1.9 g/dL — ABNORMAL LOW (ref 3.5–5.0)
Albumin: 2 g/dL — ABNORMAL LOW (ref 3.5–5.0)
Anion gap: 13 (ref 5–15)
Anion gap: 12 (ref 5–15)
BUN: 104 mg/dL — ABNORMAL HIGH (ref 6–20)
BUN: 89 mg/dL — ABNORMAL HIGH (ref 6–20)
CO2: 17 mmol/L — ABNORMAL LOW (ref 22–32)
CO2: 19 mmol/L — ABNORMAL LOW (ref 22–32)
Calcium: 7.5 mg/dL — ABNORMAL LOW (ref 8.9–10.3)
Calcium: 7.5 mg/dL — ABNORMAL LOW (ref 8.9–10.3)
Chloride: 102 mmol/L (ref 98–111)
Chloride: 100 mmol/L (ref 98–111)
Creatinine, Ser: 5.39 mg/dL — ABNORMAL HIGH (ref 0.61–1.24)
Creatinine, Ser: 4.76 mg/dL — ABNORMAL HIGH (ref 0.61–1.24)
GFR, Estimated: 13 mL/min — ABNORMAL LOW (ref >60)
GFR, Estimated: 15 mL/min — ABNORMAL LOW (ref >60)
Glucose, Bld: 358 mg/dL — ABNORMAL HIGH (ref 70–99)
Glucose, Bld: 335 mg/dL — ABNORMAL HIGH (ref 70–99)
Phosphorus: 11.1 mg/dL — ABNORMAL HIGH (ref 2.5–4.6)
Phosphorus: 8.7 mg/dL — ABNORMAL HIGH (ref 2.5–4.6)
Potassium: 6 mmol/L — ABNORMAL HIGH (ref 3.5–5.1)
Potassium: 5.5 mmol/L — ABNORMAL HIGH (ref 3.5–5.1)
Sodium: 132 mmol/L — ABNORMAL LOW (ref 135–145)
Sodium: 131 mmol/L — ABNORMAL LOW (ref 135–145)

## 2021-03-06 LAB — BLOOD GAS, ARTERIAL
Acid-base deficit: 10 mmol/L — ABNORMAL HIGH (ref 0.0–2.0)
Acid-base deficit: 9.2 mmol/L — ABNORMAL HIGH (ref 0.0–2.0)
Acid-base deficit: 9.9 mmol/L — ABNORMAL HIGH (ref 0.0–2.0)
Allens test (pass/fail): POSITIVE — AB
Bicarbonate: 16.4 mmol/L — ABNORMAL LOW (ref 20.0–28.0)
Bicarbonate: 17.6 mmol/L — ABNORMAL LOW (ref 20.0–28.0)
Bicarbonate: 19.7 mmol/L — ABNORMAL LOW (ref 20.0–28.0)
Drawn by: 308601
FIO2: 100
FIO2: 100
MECHVT: 500 mL
O2 Saturation: 83.4 %
O2 Saturation: 95.2 %
O2 Saturation: 97.3 %
PEEP: 10 cmH2O
Patient temperature: 37
Patient temperature: 98.6
Patient temperature: 98.7
RATE: 29 resp/min
pCO2 arterial: 41.3 mmHg (ref 32.0–48.0)
pCO2 arterial: 51.5 mmHg — ABNORMAL HIGH (ref 32.0–48.0)
pCO2 arterial: 66.9 mmHg (ref 32.0–48.0)
pH, Arterial: 7.097 — CL (ref 7.350–7.450)
pH, Arterial: 7.161 — CL (ref 7.350–7.450)
pH, Arterial: 7.224 — ABNORMAL LOW (ref 7.350–7.450)
pO2, Arterial: 116 mmHg — ABNORMAL HIGH (ref 83.0–108.0)
pO2, Arterial: 154 mmHg — ABNORMAL HIGH (ref 83.0–108.0)
pO2, Arterial: 75.9 mmHg — ABNORMAL LOW (ref 83.0–108.0)

## 2021-03-06 LAB — GLUCOSE, CAPILLARY
Glucose-Capillary: 142 mg/dL — ABNORMAL HIGH (ref 70–99)
Glucose-Capillary: 149 mg/dL — ABNORMAL HIGH (ref 70–99)
Glucose-Capillary: 234 mg/dL — ABNORMAL HIGH (ref 70–99)
Glucose-Capillary: 265 mg/dL — ABNORMAL HIGH (ref 70–99)
Glucose-Capillary: 279 mg/dL — ABNORMAL HIGH (ref 70–99)
Glucose-Capillary: 336 mg/dL — ABNORMAL HIGH (ref 70–99)
Glucose-Capillary: 368 mg/dL — ABNORMAL HIGH (ref 70–99)

## 2021-03-06 LAB — CBC
HCT: 25.4 % — ABNORMAL LOW (ref 39.0–52.0)
Hemoglobin: 7.3 g/dL — ABNORMAL LOW (ref 13.0–17.0)
MCH: 29.2 pg (ref 26.0–34.0)
MCHC: 28.7 g/dL — ABNORMAL LOW (ref 30.0–36.0)
MCV: 101.6 fL — ABNORMAL HIGH (ref 80.0–100.0)
Platelets: 231 10*3/uL (ref 150–400)
RBC: 2.5 MIL/uL — ABNORMAL LOW (ref 4.22–5.81)
RDW: 17.4 % — ABNORMAL HIGH (ref 11.5–15.5)
WBC: 12.3 10*3/uL — ABNORMAL HIGH (ref 4.0–10.5)
nRBC: 3.7 % — ABNORMAL HIGH (ref 0.0–0.2)

## 2021-03-06 IMAGING — DX DG CHEST 1V PORT
1 series · 1 of 1 positions shown · non-contrast
Comparison: [DATE]

CLINICAL DATA: Central line placement.

EXAM:
PORTABLE CHEST 1 VIEW

[chest ap]
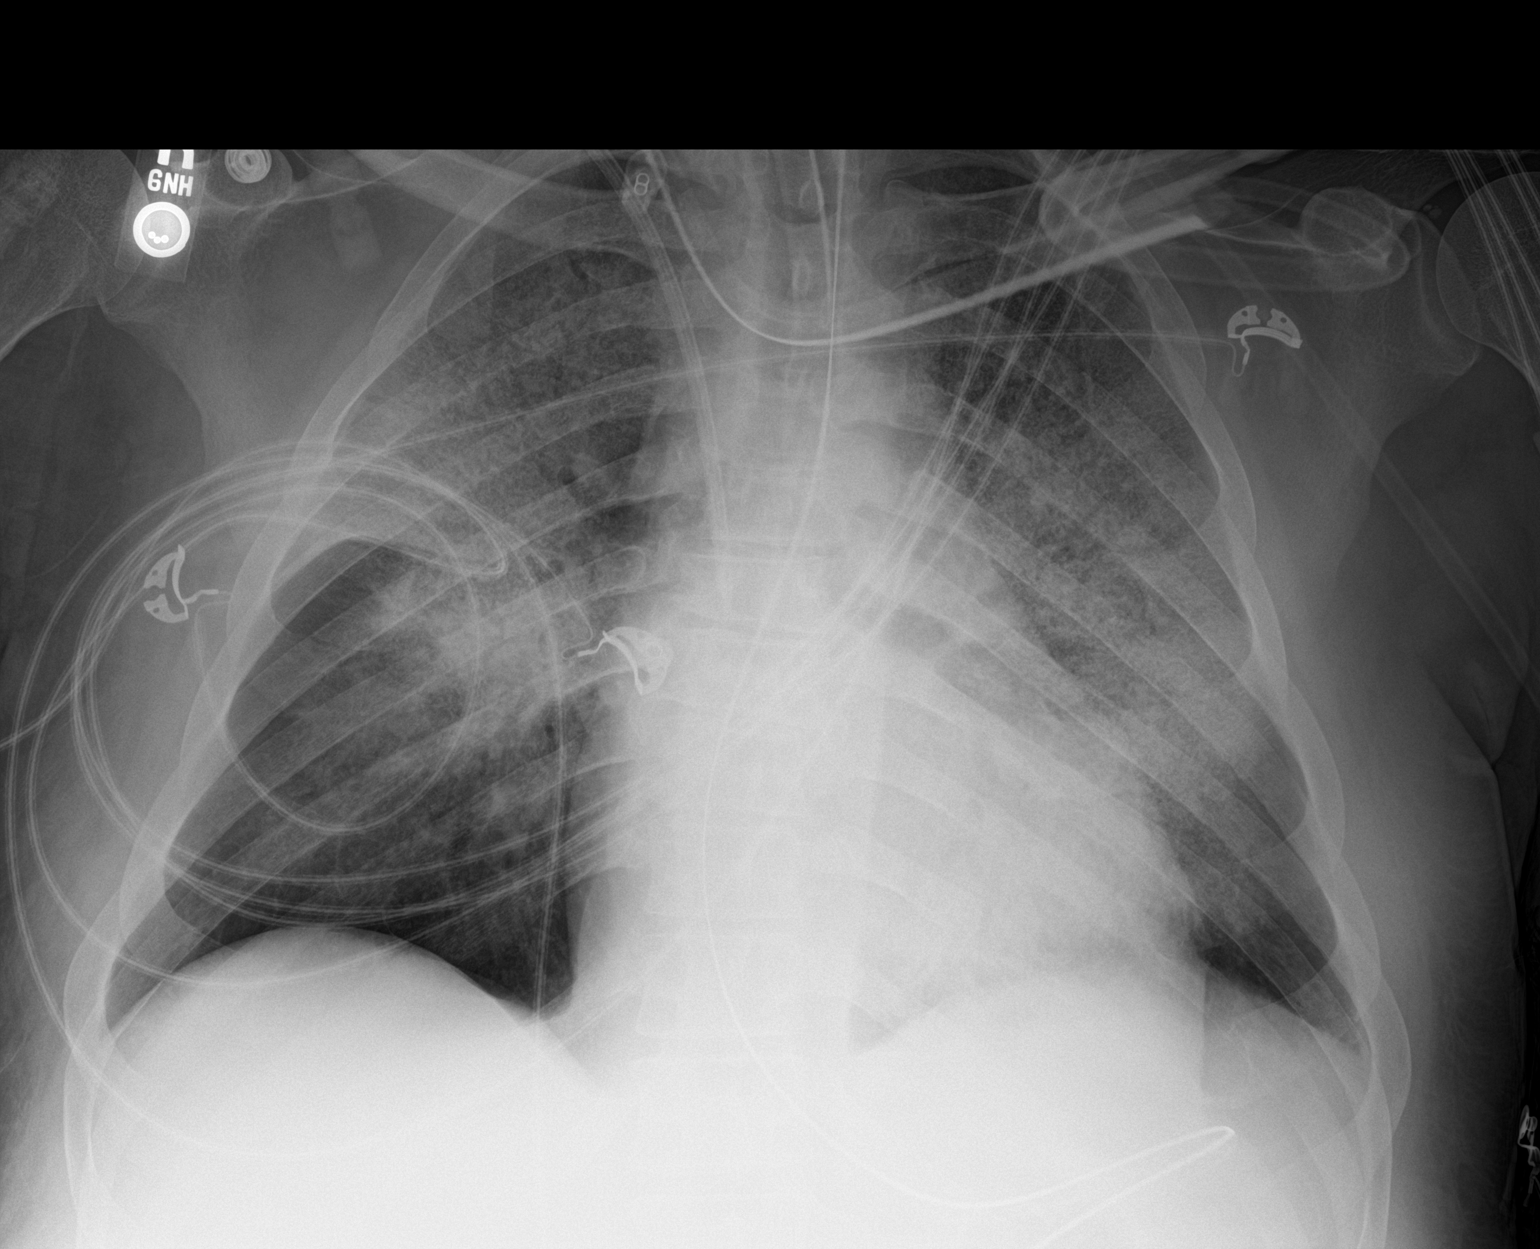

[1 of 1 positions shown; findings below may reference images not displayed]

FINDINGS: RIGHT IJ central line has been placed, tip overlying the level of
the superior vena cava. Endotracheal tube is in place, tip
centimeters above the carina. Nasogastric tube is in place, tip
beyond the image and beyond the level of the proximal stomach.

Heart is mildly enlarged. Dense airspace filling opacities are
identified throughout the UPPER lobes bilaterally, showing little
change compared to prior studies. The lung bases are relatively
spared. No pneumothorax following line placement.
IMPRESSION: RIGHT IJ central line tip to the LOWER superior vena cava.

No pneumothorax.

## 2021-03-06 MED ORDER — ORAL CARE MOUTH RINSE
15.0000 mL | OROMUCOSAL | Status: DC
  Administered 2021-03-06 – 2021-03-23 (×171): 15 mL via OROMUCOSAL

## 2021-03-06 MED ORDER — SODIUM CHLORIDE 0.9 % FOR CRRT: INTRAVENOUS_CENTRAL | Status: DC | PRN

## 2021-03-06 MED ORDER — ALTEPLASE 2 MG IJ SOLR
2.0000 mg | Freq: Once | INTRAMUSCULAR | Status: AC | PRN
  Administered 2021-03-08: 1.2 mg
  Filled 2021-03-06 (×2): qty 2

## 2021-03-06 MED ORDER — HEPARIN SODIUM (PORCINE) 1000 UNIT/ML DIALYSIS
1000.0000 [IU] | INTRAMUSCULAR | Status: DC | PRN
  Administered 2021-03-08 (×2): 1200 [IU] via INTRAVENOUS_CENTRAL
  Administered 2021-03-10: 3000 [IU] via INTRAVENOUS_CENTRAL
  Filled 2021-03-06 (×3): qty 6
  Filled 2021-03-06: qty 2
  Filled 2021-03-06: qty 6

## 2021-03-06 MED ORDER — NOREPINEPHRINE 4 MG/250ML-% IV SOLN
0.0000 ug/min | INTRAVENOUS | Status: DC
  Administered 2021-03-06: 10 ug/min via INTRAVENOUS
  Filled 2021-03-06 (×2): qty 250

## 2021-03-06 MED ORDER — MIDAZOLAM HCL 2 MG/2ML IJ SOLN
1.0000 mg | INTRAMUSCULAR | Status: DC | PRN
  Administered 2021-03-06 – 2021-03-08 (×10): 2 mg via INTRAVENOUS
  Administered 2021-03-09: 1 mg via INTRAVENOUS
  Administered 2021-03-09: 2 mg via INTRAVENOUS
  Filled 2021-03-06 (×7): qty 2

## 2021-03-06 MED ORDER — INSULIN ASPART 100 UNIT/ML IJ SOLN
0.0000 [IU] | INTRAMUSCULAR | Status: DC
  Administered 2021-03-06: 15 [IU] via SUBCUTANEOUS
  Administered 2021-03-06: 8 [IU] via SUBCUTANEOUS
  Administered 2021-03-06: 15 [IU] via SUBCUTANEOUS
  Administered 2021-03-06: 2 [IU] via SUBCUTANEOUS
  Administered 2021-03-07 (×3): 3 [IU] via SUBCUTANEOUS
  Administered 2021-03-07: 5 [IU] via SUBCUTANEOUS
  Administered 2021-03-07: 3 [IU] via SUBCUTANEOUS
  Administered 2021-03-08: 5 [IU] via SUBCUTANEOUS
  Administered 2021-03-08: 8 [IU] via SUBCUTANEOUS
  Administered 2021-03-08: 5 [IU] via SUBCUTANEOUS
  Administered 2021-03-09 (×2): 3 [IU] via SUBCUTANEOUS
  Administered 2021-03-09: 2 [IU] via SUBCUTANEOUS
  Administered 2021-03-09: 3 [IU] via SUBCUTANEOUS
  Administered 2021-03-09 (×2): 2 [IU] via SUBCUTANEOUS
  Administered 2021-03-10: 3 [IU] via SUBCUTANEOUS
  Administered 2021-03-10: 5 [IU] via SUBCUTANEOUS
  Administered 2021-03-10: 3 [IU] via SUBCUTANEOUS
  Administered 2021-03-10: 2 [IU] via SUBCUTANEOUS
  Administered 2021-03-10: 5 [IU] via SUBCUTANEOUS
  Administered 2021-03-10 – 2021-03-11 (×2): 3 [IU] via SUBCUTANEOUS
  Administered 2021-03-11 – 2021-03-12 (×3): 2 [IU] via SUBCUTANEOUS
  Administered 2021-03-12: 3 [IU] via SUBCUTANEOUS
  Administered 2021-03-12 (×2): 2 [IU] via SUBCUTANEOUS
  Administered 2021-03-12: 3 [IU] via SUBCUTANEOUS
  Administered 2021-03-13: 2 [IU] via SUBCUTANEOUS
  Administered 2021-03-13: 3 [IU] via SUBCUTANEOUS
  Administered 2021-03-13: 2 [IU] via SUBCUTANEOUS
  Administered 2021-03-14 (×3): 3 [IU] via SUBCUTANEOUS
  Administered 2021-03-14: 2 [IU] via SUBCUTANEOUS
  Administered 2021-03-14: 3 [IU] via SUBCUTANEOUS
  Administered 2021-03-15: 2 [IU] via SUBCUTANEOUS
  Administered 2021-03-15 (×2): 3 [IU] via SUBCUTANEOUS
  Administered 2021-03-15: 2 [IU] via SUBCUTANEOUS
  Administered 2021-03-15: 1 [IU] via SUBCUTANEOUS
  Administered 2021-03-15: 3 [IU] via SUBCUTANEOUS
  Administered 2021-03-16 (×3): 5 [IU] via SUBCUTANEOUS
  Administered 2021-03-16: 3 [IU] via SUBCUTANEOUS
  Administered 2021-03-16 (×3): 5 [IU] via SUBCUTANEOUS
  Administered 2021-03-17 – 2021-03-18 (×7): 3 [IU] via SUBCUTANEOUS
  Administered 2021-03-18: 5 [IU] via SUBCUTANEOUS
  Administered 2021-03-18: 3 [IU] via SUBCUTANEOUS
  Administered 2021-03-18 (×2): 5 [IU] via SUBCUTANEOUS
  Administered 2021-03-19: 3 [IU] via SUBCUTANEOUS
  Administered 2021-03-19 (×2): 5 [IU] via SUBCUTANEOUS
  Administered 2021-03-19: 2 [IU] via SUBCUTANEOUS
  Administered 2021-03-19: 5 [IU] via SUBCUTANEOUS
  Administered 2021-03-19: 8 [IU] via SUBCUTANEOUS
  Administered 2021-03-20 (×2): 3 [IU] via SUBCUTANEOUS
  Administered 2021-03-20: 5 [IU] via SUBCUTANEOUS
  Administered 2021-03-20: 3 [IU] via SUBCUTANEOUS
  Administered 2021-03-20: 5 [IU] via SUBCUTANEOUS
  Administered 2021-03-21: 2 [IU] via SUBCUTANEOUS
  Administered 2021-03-21: 5 [IU] via SUBCUTANEOUS
  Administered 2021-03-21 (×2): 3 [IU] via SUBCUTANEOUS
  Administered 2021-03-21: 5 [IU] via SUBCUTANEOUS
  Administered 2021-03-21 (×2): 3 [IU] via SUBCUTANEOUS
  Administered 2021-03-22: 2 [IU] via SUBCUTANEOUS
  Administered 2021-03-22 (×3): 3 [IU] via SUBCUTANEOUS
  Administered 2021-03-22 – 2021-03-23 (×2): 2 [IU] via SUBCUTANEOUS

## 2021-03-06 MED ORDER — FENTANYL BOLUS VIA INFUSION
30.0000 ug | INTRAVENOUS | Status: DC | PRN
Start: 2021-03-06 — End: 2021-03-09
  Administered 2021-03-06 – 2021-03-09 (×8): 30 ug via INTRAVENOUS
  Filled 2021-03-06: qty 30

## 2021-03-06 MED ORDER — PRISMASOL BGK 4/2.5 32-4-2.5 MEQ/L REPLACEMENT SOLN: Status: DC

## 2021-03-06 MED ORDER — SODIUM CHLORIDE 0.9 % IV SOLN
250.0000 mL | INTRAVENOUS | Status: DC
  Administered 2021-03-06 – 2021-03-25 (×5): 250 mL via INTRAVENOUS

## 2021-03-06 MED ORDER — PHENYLEPHRINE HCL-NACL 10-0.9 MG/250ML-% IV SOLN
25.0000 ug/min | INTRAVENOUS | Status: DC
  Administered 2021-03-06: 25 ug/min via INTRAVENOUS
  Filled 2021-03-06: qty 250

## 2021-03-06 MED ORDER — VASOPRESSIN 20 UNITS/100 ML INFUSION FOR SHOCK
0.0000 [IU]/min | INTRAVENOUS | Status: DC
  Administered 2021-03-06 – 2021-03-07 (×4): 0.03 [IU]/min via INTRAVENOUS
  Filled 2021-03-06 (×6): qty 100

## 2021-03-06 MED ORDER — CHLORHEXIDINE GLUCONATE 0.12% ORAL RINSE (MEDLINE KIT)
15.0000 mL | Freq: Two times a day (BID) | OROMUCOSAL | Status: DC
  Administered 2021-03-05 – 2021-03-30 (×46): 15 mL via OROMUCOSAL

## 2021-03-06 MED ORDER — FLUCONAZOLE IN SODIUM CHLORIDE 400-0.9 MG/200ML-% IV SOLN
400.0000 mg | INTRAVENOUS | Status: DC
  Administered 2021-03-06 – 2021-03-09 (×4): 400 mg via INTRAVENOUS
  Filled 2021-03-06 (×4): qty 200

## 2021-03-06 MED ORDER — PHENYLEPHRINE HCL-NACL 10-0.9 MG/250ML-% IV SOLN
0.0000 ug/min | INTRAVENOUS | Status: DC
  Administered 2021-03-06: 20 ug/min via INTRAVENOUS
  Filled 2021-03-06: qty 250

## 2021-03-06 MED ORDER — SODIUM CHLORIDE 0.9 % IV SOLN
2.0000 g | Freq: Two times a day (BID) | INTRAVENOUS | Status: DC
  Administered 2021-03-06 – 2021-03-09 (×7): 2 g via INTRAVENOUS
  Filled 2021-03-06 (×7): qty 2

## 2021-03-06 MED ORDER — HEPARIN SODIUM (PORCINE) 1000 UNIT/ML IJ SOLN
3000.0000 [IU] | Freq: Once | INTRAMUSCULAR | Status: AC
  Administered 2021-03-06: 3000 [IU] via INTRAVENOUS
  Filled 2021-03-06: qty 3

## 2021-03-06 MED ORDER — NOREPINEPHRINE 16 MG/250ML-% IV SOLN
0.0000 ug/min | INTRAVENOUS | Status: DC
  Administered 2021-03-06: 40 ug/min via INTRAVENOUS
  Administered 2021-03-07: 24 ug/min via INTRAVENOUS
  Administered 2021-03-07: 6 ug/min via INTRAVENOUS
  Filled 2021-03-06: qty 250

## 2021-03-06 MED ORDER — MIDAZOLAM 50MG/50ML (1MG/ML) PREMIX INFUSION
1.0000 mg/h | INTRAVENOUS | Status: DC
  Administered 2021-03-06: 1 mg/h via INTRAVENOUS
  Administered 2021-03-06: 3 mg/h via INTRAVENOUS
  Administered 2021-03-07: 6 mg/h via INTRAVENOUS
  Administered 2021-03-07: 3 mg/h via INTRAVENOUS
  Administered 2021-03-08: 5 mg/h via INTRAVENOUS
  Administered 2021-03-08 (×2): 6 mg/h via INTRAVENOUS
  Administered 2021-03-09: 10 mg/h via INTRAVENOUS
  Administered 2021-03-09 (×2): 5.5 mg/h via INTRAVENOUS
  Administered 2021-03-10: 8 mg/h via INTRAVENOUS
  Administered 2021-03-10: 8.5 mg/h via INTRAVENOUS
  Administered 2021-03-10: 7 mg/h via INTRAVENOUS
  Filled 2021-03-06 (×14): qty 50

## 2021-03-06 MED ORDER — LAMIVUDINE 150 MG PO TABS
150.0000 mg | ORAL_TABLET | Freq: Every day | ORAL | Status: DC
  Administered 2021-03-07 – 2021-03-12 (×6): 150 mg
  Filled 2021-03-06 (×6): qty 1

## 2021-03-06 MED ORDER — PRISMASOL BGK 4/2.5 32-4-2.5 MEQ/L EC SOLN: Status: DC

## 2021-03-06 MED ORDER — DOLUTEGRAVIR SODIUM 50 MG PO TABS
50.0000 mg | ORAL_TABLET | Freq: Every day | ORAL | Status: DC
  Administered 2021-03-07 – 2021-03-22 (×16): 50 mg
  Filled 2021-03-06 (×16): qty 1

## 2021-03-06 MED ORDER — MIDAZOLAM HCL 2 MG/2ML IJ SOLN
2.0000 mg | Freq: Once | INTRAMUSCULAR | Status: AC
  Administered 2021-03-06: 2 mg via INTRAVENOUS

## 2021-03-06 NOTE — Progress Notes (Signed)
Patient prepared for intubation with CHG mouth rise preformed.  Time out was preformed prior to intubation with MD/RT/CN in the room.  The patient was given R.I.S. kit medications which are listed on the [facility]. ETT 7.5 and OG 31 cm placed.

## 2021-03-06 NOTE — Progress Notes (Signed)
Called and talked to his mother Hassan Rowan about his need for intubation after ground team had spoken to him.  Patient nervous but agreed to be intubated to help protect his airway.

## 2021-03-06 NOTE — Progress Notes (Signed)
PHARMACY NOTE:  ANTIMICROBIAL RENAL DOSAGE ADJUSTMENT  Current antimicrobial regimen includes a mismatch between antimicrobial dosage and estimated renal function.  As per policy approved by the Pharmacy & Therapeutics and Medical Executive Committees, the antimicrobial dosage will be adjusted accordingly.  Current antimicrobial dosage:  Cefepime 2 gm q24 Fluconazole 200 mg IV q24 Clinda 900 IV q8 Primiquine 30 mg per tube qday  Indication: r/o PCP PNA, R/o PNA, candida esophagitis  Renal Function:  Estimated Creatinine Clearance: 20.6 mL/min (A) (by C-G formula based on SCr of 5.39 mg/dL (H)). '[]'$      On intermittent HD, scheduled: '[x]'$      On CRRT    Antimicrobial dosage has been changed to:  cefepime 2 gm IV q12 Fluconazole 400 mg IV q24  Additional comments: will f/u CRRT tolerance daily   Thank you for allowing pharmacy to be a part of this patient's care.  Eudelia Bunch, Pharm.D 03/06/2021 12:55 PM

## 2021-03-06 NOTE — Plan of Care (Signed)
Discussed with patient plan of care for the evening, pain management and use of a condom catheter with his increased work of breathing with some teach back displayed.  The patient has been informed that his work of breathing and use of accessory muscles was getting worse and may need a Bipap or intubation tonight.  Problem: Education: Goal: Knowledge of General Education information will improve Description: Including pain rating scale, medication(s)/side effects and non-pharmacologic comfort measures Outcome: Progressing   Problem: Health Behavior/Discharge Planning: Goal: Ability to manage health-related needs will improve Outcome: Progressing   Problem: Coping: Goal: Level of anxiety will decrease Outcome: Progressing   Problem: Elimination: Goal: Will not experience complications related to urinary retention Outcome: Progressing   Problem: Pain Managment: Goal: General experience of comfort will improve Outcome: Progressing

## 2021-03-06 NOTE — Procedures (Signed)
Central Venous Catheter Insertion Procedure Note  LUISMANUEL SHONTZ  [id number]  1976/02/06  Date:03/06/21  Time:10:25 AM   Provider Performing:Cheney Gosch Alfredo Martinez   Procedure: Insertion of Non-tunneled Central Venous Catheter(36556)with US guidance [id number])    Indication(s) Medication administration, Difficult access, and Hemodialysis  Consent Risks of the procedure as well as the alternatives and risks of each were explained to the patient and/or caregiver.  Consent for the procedure was obtained and is signed in the bedside chart  Anesthesia Topical only with 1% lidocaine   Timeout Verified patient identification, verified procedure, site/side was marked, verified correct patient position, special equipment/implants available, medications/allergies/relevant history reviewed, required imaging and test results available.  Sterile Technique Maximal sterile technique including full sterile barrier drape, hand hygiene, sterile gown, sterile gloves, mask, hair covering, sterile ultrasound probe cover (if used).  Procedure Description Area of catheter insertion was cleaned with chlorhexidine and draped in sterile fashion.   With real-time ultrasound guidance a HD catheter was placed into the right internal jugular vein.  Nonpulsatile blood flow and easy flushing noted in all ports.  The catheter was sutured in place and sterile dressing applied.      Complications/Tolerance None; patient tolerated the procedure well. Chest X-ray is ordered to verify placement for internal jugular or subclavian cannulation.  Chest x-ray is not ordered for femoral cannulation.  EBL Minimal  Specimen(s) None   Noe Gens, MSN, APRN, NP-C, AGACNP-BC [facility] 03/06/2021, 10:25 AM   Please see [email or web address] for pager details.   From 7A-7P if no response, please call [phone] After hours, please call ELink [phone]

## 2021-03-06 NOTE — Progress Notes (Signed)
NAME:  Bruce Hunter, MRN:  [id number], DOB:  1976-03-19, LOS: 2 ADMISSION DATE:  03/04/2021, CONSULTATION DATE:  03/04/21 REFERRING MD:  Melina Copa, CHIEF COMPLAINT:  Shortness of breath.     Brief Narrative:  45 yo man with HIV/AIDS here with acute hypoxemic respiratory failure.  Complaints of SOB and cough productive of tan sputum.  Has had respiratory symptoms since he was discharged earlier this month (6/13).     Recently diagnosed with HIV/AIDS, started on medications, but patient tells me he "hasn't had time to start taking them" but he isn't really able to explain further.  Patient was initiated on lamivudine, Tivicay, dapsone and fluconazole on discharge.  Patient does have an appointment with infectious disease as outpatient.  Candida esophagitis. At the time.    ID note 6/17:  DTG plus lamivudine. He can get the combined pill, dovato, upon discharge. 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 1069mdaily  Pertinent  Medical History  Asthma DM2 Gout HTN Obesity  GERD AKI Neuropathy  Significant Hospital Events:  6/30 admitted for PNA with known HIV/AIDS 7/1 ET >>> Echo 7/1 mild  RA dilation with RV low nl fxn mild PAS elevation and nl LV 7/2 HD catheter >>>    Scheduled Meds:  chlorhexidine gluconate (MEDLINE KIT)  15 mL Mouth Rinse BID   Chlorhexidine Gluconate Cloth  6 each Topical Daily   dolutegravir  50 mg Oral Daily   heparin  5,000 Units Subcutaneous Q8H   insulin aspart  1-3 Units Subcutaneous Q4H   ipratropium-albuterol  3 mL Nebulization Q4H   lamiVUDine  150 mg Oral Daily   mouth rinse  15 mL Mouth Rinse 10 times per day   methylPREDNISolone (SOLU-MEDROL) injection  40 mg Intravenous Q24H   pantoprazole (PROTONIX) IV  40 mg Intravenous QHS   primaquine  30 mg Oral Q24H   Continuous Infusions:  sodium chloride      sodium chloride 10 mL/hr at 03/06/21 0746   ceFEPime (MAXIPIME) IV Stopped (03/05/21 2137)   clindamycin (CLEOCIN) IV Stopped (03/06/21 0537)   dextrose 25 mL/hr at 03/06/21 0509   dextrose 5 % and 0.9% NaCl 25 mL/hr at 03/06/21 0746   fentaNYL infusion INTRAVENOUS 300 mcg/hr (03/06/21 0746)   fluconazole (DIFLUCAN) IV Stopped (03/05/21 2246)   norepinephrine (LEVOPHED) Adult infusion 10 mcg/min (03/06/21 0746)   phenylephrine (NEO-SYNEPHRINE) Adult infusion 45 mcg/min (03/06/21 0746)   vasopressin 0.03 Units/min (03/06/21 0746)   PRN Meds:.acetaminophen, albuterol, docusate sodium, fentaNYL, guaiFENesin, midazolam, ondansetron (ZOFRAN) IV, polyethylene glycol     Interim History / Subjective:  Trying to sit up in bed despite fent drip/ versed pushes    Objective   Blood pressure (!) 104/59, pulse 94, temperature 98.1 F (36.7 C), temperature source Axillary, resp. rate (!) 29, height _0  (1.803 m), weight 95.2 kg, SpO2 100 %.    Vent Mode: PRVC FiO2 (%):  [100 %] 100 % Set Rate:  [16 bmp-29 bmp] 29 bmp Vt Set:  [500 mL] 500 mL PEEP:  [10 cmH20] 10 cmH20 Plateau Pressure:  [23 cmH20-26 cmH20] 26 cmH20   Intake/Output Summary (Last 24 hours) at 03/06/2021 0936 Last data filed at 03/06/2021 0746 Gross per 24 hour  Intake 3517.25 ml  Output 501 ml  Net 3016.25 ml   Filed Weights   03/04/21 1807 03/06/21 0500  Weight:  90.7 kg 95.2 kg    Examination: Tmax  98 General appearance:    middle aged black male sedated on vent p versed push    No jvd Oropharynx clear,  mucosa nl Neck supple Lungs with a few scattered exp > insp rhonchi bilaterally RRR no s3 or or sign murmur Abd obese with nl excursion  Extr warm with no edema or clubbing noted Neuro  Sensorium sedated,  no apparent motor deficits    I personally reviewed images and agree with radiology impression as follows:  CXR:   7/1  1. Endotracheal tube tip 4.6 cm from the carina. Enteric tube in place, better  assessed on subsequent abdominal radiograph. 2. Stable cardiomegaly and bilateral perihilar airspace disease/edema.    Resolved Hospital Problem list     Assessment & Plan:  PNA, immunocompromised in the setting of HIV/AIDS -Concern for PJP pneumonia  P: Head of bed elevated 30 degrees Follow intermittent chest x-ray and ABG Ensure adequate pulmonary hygiene  Follow cultures Antibiotics per ID     Sepsis with PCT elevation P: Follow pan cultures ABX per ID  Trend lactic acid Continue stress dose steroids    AIDS/HIV Low CD 4, HIV viral load >1.47 mil P: ID following / rx per ID     AKI on CKD -last Creatinines in  2-3 range Lab Results  Component Value Date   CREATININE 5.50 (H) 03/05/2021   CREATININE 5.50 (H) 03/05/2021   CREATININE 5.68 (H) 03/04/2021    Hyperkalemia   P: Per renal/ for HD catheter today   LE edema:  Hypoalbuminemia  - urine protein neg so this is not nephrotic syndrome  - RA pressures slt up on echo 7/1   Elevated LFTs  -Elevated AP, AST 70, ALT 32,  P: Trend LFTs Avoid hepatotoxins   Hx of HTN  Home medications include Norvasc, HCTZ, Lisinopril,  P: Hold home meds  Continuous telemetry   Type 2 diabetes Neuropathic pain: in feet P: SSI increased to moderate ssi 7/2  CBG checks q4 Continue Gabapentin  Best Practice    Diet/type: NPO   DVT prophylaxis: prophylactic heparin  GI prophylaxis: PPI Lines:  HD catheter Foley:  N/A Code Status:  full code Last date of multidisciplinary goals of care discussion     The patient is critically ill with multiple organ systems failure and requires high complexity decision making for assessment and support, frequent evaluation and titration of therapies, application of advanced monitoring technologies and extensive interpretation of multiple databases. Critical Care Time devoted to patient care services described in this note is 40 minutes.   Christinia Gully, MD Pulmonary and  [place] [phone]   After 7:00 pm call Elink  [phone]

## 2021-03-06 NOTE — Progress Notes (Addendum)
Attempted to call his mom this morning and give her an update. No answer at the home or mobile number.  Mom called back and informed her about his pressors, restraints and sedation medications.

## 2021-03-06 NOTE — Progress Notes (Signed)
[facility] Progress Note  Subjective: intubated now, groggy  Vitals:   03/06/21 0715 03/06/21 0810 03/06/21 0843 03/06/21 1119  BP: (!) 91/53 (!) 104/59  (!) 92/52  Pulse: 91 94  92  Resp: (!) 29 (!) 29  (!) 29  Temp:   98.1 F (36.7 C)   TempSrc:   Axillary   SpO2: 100% 100%  100%  Weight:      Height:        Exam: on vent ,sedated  no jvd  throat ett in place  Chest cta bilat and lat  Cor reg no RG  Abd soft ntnd no ascites   Ext 1+ ankle / pedal edema bilat   Neuro on vent and sedated         Home meds:  - norvasc/ hctz/ lisinopril  - epivir/ dolutegravir/ lamivudine/ dapsone  - klonopin 0.5 tid prn/ neurontin 600 tid/ norvco prn/ mobic 15 qd/ ultram prn/ ambien prn  - prilosec /diflucan  - prn's/ vitamins/ supplements     UA 6/30 - negative    UNa 52,  UCr 150     Renal US - 11 and 14 cm kidneys w/o hydro           Date                       Creat               eGFR     June 2021               2.80                      June 2022               4.9 >> 2.74      16- 30 ml/min       Assessment/ Plan: AKI on CKD IV - b/l creat from June 2022 is 2.5- 3.0.  Creat here is 5.5 in setting of acute resp distress/ multifocal pna/ WBC 24k, probable pneumonia, which may be HIV related vs other. Pt was on ACEi and mobic at home, suspect volume depletion / hypotension/ acei/ COX II inhibitor causing hemodynamic AKI or ATN. Renal US nonobstructive, UA negative. In ICU on pressors and UOP 200cc yesterday. Significant azotemia and hyperkalemia, recommend proceed w/ CRRT.  Volume - mild vol overload on exam. CXR's more c/w infection than fluid. Doubt can pull fluid regardless given shock on pressors. Will keep even for now. If CVP can be done that would help.  Pneumonia - on multple IV abx per CCM and ID. HIV - recently diagnosed and quite advanced Shock - on multiple pressors today         Bruce Hunter 03/06/2021, 12:18 PM   Recent Labs  Lab  03/05/21 0130 03/05/21 0415 03/05/21 0709 03/05/21 2051 03/06/21 1031  K 6.1* 6.2*   < > 4.2 6.0*  BUN 92* 93*  --   --  104*  CREATININE 5.50* 5.50*  --   --  5.39*  CALCIUM 7.8* 7.9*  --   --  7.5*  PHOS  --   --   --   --  11.1*  HGB 7.0* 7.1*  --   --   --    < > = values in this interval not displayed.   Inpatient medications:  chlorhexidine gluconate (MEDLINE KIT)  15 mL Mouth Rinse BID  Chlorhexidine Gluconate Cloth  6 each Topical Daily   [START ON 03/07/2021] dolutegravir  50 mg Per Tube Daily   heparin  5,000 Units Subcutaneous Q8H   insulin aspart  0-15 Units Subcutaneous Q4H   ipratropium-albuterol  3 mL Nebulization Q4H   lamiVUDine  150 mg Oral Daily   mouth rinse  15 mL Mouth Rinse 10 times per day   methylPREDNISolone (SOLU-MEDROL) injection  40 mg Intravenous Q24H   pantoprazole (PROTONIX) IV  40 mg Intravenous QHS   primaquine  30 mg Oral Q24H    sodium chloride     sodium chloride 10 mL/hr at 03/06/21 0746   ceFEPime (MAXIPIME) IV Stopped (03/05/21 2137)   clindamycin (CLEOCIN) IV Stopped (03/06/21 0537)   dextrose 25 mL/hr at 03/06/21 0509   dextrose 5 % and 0.9% NaCl 25 mL/hr at 03/06/21 0746   fentaNYL infusion INTRAVENOUS 300 mcg/hr (03/06/21 0746)   fluconazole (DIFLUCAN) IV Stopped (03/05/21 2246)   norepinephrine (LEVOPHED) Adult infusion 10 mcg/min (03/06/21 1124)   phenylephrine (NEO-SYNEPHRINE) Adult infusion 20 mcg/min (03/06/21 1132)   vasopressin 0.03 Units/min (03/06/21 1143)   acetaminophen, albuterol, docusate sodium, fentaNYL, guaiFENesin, midazolam, ondansetron (ZOFRAN) IV, polyethylene glycol

## 2021-03-06 NOTE — Progress Notes (Signed)
eLink Physician-Brief Progress Note Patient Name: Bruce Hunter DOB: [birth year] MRN: [id number]   Date of Service  03/06/2021  HPI/Events of Note  Hypotension - Nursing request for A-line.   eICU Interventions  Plan: RT to place A-line.     Intervention Category Major Interventions: Hypotension - evaluation and management  Nastashia Gallo [place] 03/06/2021, 6:09 AM

## 2021-03-06 NOTE — Progress Notes (Addendum)
INFECTIOUS DISEASE PROGRESS NOTE  ID: Bruce Hunter is a 45 y.o. 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  On vent, no response.   Abtx:  Anti-infectives (From admission, onward)    Start     Dose/Rate Route Frequency Ordered Stop   03/07/21 1000  dolutegravir (TIVICAY) tablet 50 mg        50 mg Per Tube Daily 03/06/21 1132     03/05/21 2130  fluconazole (DIFLUCAN) IVPB 200 mg        200 mg 100 mL/hr over 60 Minutes Intravenous Every 24 hours 03/05/21 0810     03/05/21 1000  dolutegravir (TIVICAY) tablet 50 mg  Status:  Discontinued        50 mg Oral Daily 03/04/21 2214 03/06/21 1132   03/05/21 1000  lamiVUDine (EPIVIR) tablet 300 mg  Status:  Discontinued        300 mg Oral Daily 03/04/21 2217 03/04/21 2217   03/05/21 1000  lamiVUDine (EPIVIR) tablet 150 mg        150 mg Oral Daily 03/04/21 2217     03/04/21 2200  clindamycin (CLEOCIN) IVPB 900 mg        900 mg 100 mL/hr over 30 Minutes Intravenous Every 8 hours 03/04/21 2157     03/04/21 2200  primaquine tablet 30 mg        30 mg Oral Every 24 hours 03/04/21 2157     03/04/21 2145  vancomycin (VANCOREADY) IVPB 1500 mg/300 mL        1,500 mg 150 mL/hr over 120 Minutes Intravenous  Once 03/04/21 2144 03/05/21 0050   03/04/21 2145  ceFEPIme (MAXIPIME) 2 g in sodium chloride 0.9 % 100 mL IVPB        2 g 200 mL/hr over 30 Minutes Intravenous Every 24 hours 03/04/21 2144     03/04/21 2143  vancomycin variable dose per unstable renal function (pharmacist dosing)  Status:  Discontinued         Does not apply See admin instructions 03/04/21 2144 03/06/21 0813    03/04/21 2130  fluconazole (DIFLUCAN) IVPB 400 mg  Status:  Discontinued        400 mg 100 mL/hr over 120 Minutes Intravenous Every 24 hours 03/04/21 2126 03/05/21 0810   03/04/21 1915  cefTRIAXone (ROCEPHIN) 1 g in sodium chloride 0.9 % 100 mL IVPB        1 g 200 mL/hr over 30 Minutes Intravenous  Once 03/04/21 1914 03/04/21 2045   03/04/21 1915  azithromycin (ZITHROMAX) 500 mg in sodium chloride 0.9 % 250 mL IVPB        500 mg 250 mL/hr over 60 Minutes Intravenous  Once 03/04/21 1914 03/04/21 2201       Medications: Scheduled:  chlorhexidine gluconate (MEDLINE KIT)  15 mL Mouth Rinse BID   Chlorhexidine Gluconate Cloth  6 each Topical Daily   [START ON 03/07/2021] dolutegravir  50 mg Per Tube Daily   heparin  5,000 Units Subcutaneous Q8H   insulin aspart  0-15 Units Subcutaneous Q4H   ipratropium-albuterol  3 mL Nebulization Q4H   lamiVUDine  150 mg  Oral Daily   mouth rinse  15 mL Mouth Rinse 10 times per day   methylPREDNISolone (SOLU-MEDROL) injection  40 mg Intravenous Q24H   pantoprazole (PROTONIX) IV  40 mg Intravenous QHS   primaquine  30 mg Oral Q24H    Objective: Vital signs in last 24 hours: Temp:  [97.9 F (36.6 C)-98.1 F (36.7 C)] 98.1 F (36.7 C) (07/02 0843) Pulse Rate:  [91-135] 104 (07/02 1230) Resp:  [0-41] 26 (07/02 1230) BP: (78-160)/(46-95) 100/54 (07/02 1230) SpO2:  [73 %-100 %] 97 % (07/02 1230) FiO2 (%):  [70 %-100 %] 70 % (07/02 1119) Weight:  [95.2 kg] 95.2 kg (07/02 0500)   General appearance: no distress Resp: diminished breath sounds anterior - bilateral and rhonchi anterior - bilateral Cardio: regularly irregular rhythm GI: normal findings: bowel sounds normal and soft, non-tender Extremities: edema 3+  Lab Results Recent Labs    03/05/21 0415 03/05/21 0709 03/05/21 2051 03/06/21 1031 03/06/21 1150  WBC 24.4*  --   --   --  12.3*  HGB 7.1*  --   --   --  7.3*  HCT 24.0*  --   --   --  25.4*  NA 136  --   --  132*  --   K 6.2*   <  > 4.2 6.0*  --   CL 104  --   --  102  --   CO2 18*  --   --  17*  --   BUN 93*  --   --  104*  --   CREATININE 5.50*  --   --  5.39*  --    < > = values in this interval not displayed.   Liver Panel Recent Labs    03/04/21 1814 03/05/21 0130 03/06/21 1031  PROT 8.4* 7.2  --   ALBUMIN 2.0* 1.8* 1.9*  AST 70* 54*  --   ALT 32 26  --   ALKPHOS 167* 145*  --   BILITOT 0.6 0.7  --    Sedimentation Rate No results for input(s): ESRSEDRATE in the last 72 hours. C-Reactive Protein No results for input(s): CRP in the last 72 hours.  Microbiology: Recent Results (from the past 240 hour(s))  Culture, blood (routine x 2)     Status: None (Preliminary result)   Collection Time: 03/04/21  6:14 PM   Specimen: BLOOD LEFT FOREARM  Result Value Ref Range Status   Specimen Description   Final    BLOOD LEFT FOREARM Performed at [facility] [address], [place], [place] [address]    Special Requests   Final    BOTTLES DRAWN AEROBIC AND ANAEROBIC Blood Culture adequate volume Performed at [facility] [address], [place], [place] [address]    Culture   Final    NO GROWTH 2 DAYS Performed at [facility] [address], [place], [place] [address]    Report Status PENDING  Incomplete  Resp Panel by RT-PCR (Flu A&B, Covid) Nasopharyngeal Swab     Status: None   Collection Time: 03/04/21  7:52 PM   Specimen: Nasopharyngeal Swab; Nasopharyngeal(NP) swabs in vial transport medium  Result Value Ref Range Status   SARS Coronavirus 2 by RT PCR NEGATIVE NEGATIVE Final    Comment: (NOTE) SARS-CoV-2 target nucleic acids are NOT DETECTED.  The SARS-CoV-2 RNA is generally detectable in upper respiratory specimens during the acute phase of infection. The lowest concentration of SARS-CoV-2 viral copies this  assay can detect is 138 copies/mL. A negative result does not preclude SARS-Cov-2 infection and should not be used as the sole basis for  treatment or other patient management decisions. A negative result may occur with  improper specimen collection/handling, submission of specimen other than nasopharyngeal swab, presence of viral mutation(s) within the areas targeted by this assay, and inadequate number of viral copies(<138 copies/mL). A negative result must be combined with clinical observations, patient history, and epidemiological information. The expected result is Negative.  Fact Sheet for Patients:  EntrepreneurPulse.com.au  Fact Sheet for Healthcare Providers:  IncredibleEmployment.be  This test is no t yet approved or cleared by the [place] FDA and  has been authorized for detection and/or diagnosis of SARS-CoV-2 by FDA under an Emergency Use Authorization (EUA). This EUA will remain  in effect (meaning this test can be used) for the duration of the COVID-19 declaration under Section 564(b)(1) of the Act, 21 U.S.C.section 360bbb-3(b)(1), unless the authorization is terminated  or revoked sooner.       Influenza A by PCR NEGATIVE NEGATIVE Final   Influenza B by PCR NEGATIVE NEGATIVE Final    Comment: (NOTE) The Xpert Xpress SARS-CoV-2/FLU/RSV plus assay is intended as an aid in the diagnosis of influenza from Nasopharyngeal swab specimens and should not be used as a sole basis for treatment. Nasal washings and aspirates are unacceptable for Xpert Xpress SARS-CoV-2/FLU/RSV testing.  Fact Sheet for Patients: EntrepreneurPulse.com.au  Fact Sheet for Healthcare Providers: IncredibleEmployment.be  This test is not yet approved or cleared by the [place] FDA and has been authorized for detection and/or diagnosis of SARS-CoV-2 by FDA under an Emergency Use Authorization (EUA). This EUA will remain in effect (meaning this test can be used) for the duration of the COVID-19 declaration under Section 564(b)(1) of the Act, 21  U.S.C. section 360bbb-3(b)(1), unless the authorization is terminated or revoked.  Performed at [facility], [place] [address], [place], [place] [address]   MRSA PCR Screening     Status: None   Collection Time: 03/05/21 10:41 AM  Result Value Ref Range Status   MRSA by PCR NEGATIVE NEGATIVE Final    Comment:        The GeneXpert MRSA Assay (FDA approved for NASAL specimens only), is one component of a comprehensive MRSA colonization surveillance program. It is not intended to diagnose MRSA infection nor to guide or monitor treatment for MRSA infections. Performed at [facility], [place] [address], [place], [place] [address]   Respiratory (~20 pathogens) panel by PCR     Status: None   Collection Time: 03/05/21 10:42 AM   Specimen: Nasopharyngeal Swab; Respiratory  Result Value Ref Range Status   Adenovirus NOT DETECTED NOT DETECTED Final   Coronavirus 229E NOT DETECTED NOT DETECTED Final    Comment: (NOTE) The Coronavirus on the Respiratory Panel, DOES NOT test for the novel  Coronavirus (2019 nCoV)    Coronavirus HKU1 NOT DETECTED NOT DETECTED Final   Coronavirus NL63 NOT DETECTED NOT DETECTED Final   Coronavirus OC43 NOT DETECTED NOT DETECTED Final   Metapneumovirus NOT DETECTED NOT DETECTED Final   Rhinovirus / Enterovirus NOT DETECTED NOT DETECTED Final   Influenza A NOT DETECTED NOT DETECTED Final   Influenza B NOT DETECTED NOT DETECTED Final   Parainfluenza Virus 1 NOT DETECTED NOT DETECTED Final   Parainfluenza Virus 2 NOT DETECTED NOT DETECTED Final   Parainfluenza Virus 3 NOT DETECTED NOT DETECTED Final   Parainfluenza Virus 4  NOT DETECTED NOT DETECTED Final   Respiratory Syncytial Virus NOT DETECTED NOT DETECTED Final   Bordetella pertussis NOT DETECTED NOT DETECTED Final   Bordetella Parapertussis NOT DETECTED NOT DETECTED Final   Chlamydophila pneumoniae NOT DETECTED NOT DETECTED Final   Mycoplasma pneumoniae NOT DETECTED  NOT DETECTED Final    Comment: Performed at [facility], [place] [address], [place], [place] [address]    Studies/Results: DG Abd 1 View  Result Date: 03/06/2021 CLINICAL DATA:  NG tube placement. EXAM: ABDOMEN - 1 VIEW COMPARISON:  CT 02/12/2021 FINDINGS: Tip and side port of the enteric tube below the diaphragm in the stomach. No bowel dilatation in the upper abdomen. IMPRESSION: Tip and side port of the enteric tube below the diaphragm in the stomach. Electronically Signed   By: Keith Rake M.D.   On: 03/06/2021 00:01   US RENAL  Result Date: 03/05/2021 CLINICAL DATA:  Acute renal injury EXAM: RENAL / URINARY TRACT ULTRASOUND COMPLETE COMPARISON:  02/12/2021 FINDINGS: Right Kidney: Renal measurements: 11.6 x 5.6 x 5.1 cm. = volume: 172 mL. Echogenicity within normal limits. No mass or hydronephrosis visualized. Left Kidney: Renal measurements: 14.1 x 7.2 x 7.1 cm. = volume: 373 mL. Echogenicity within normal limits. No mass or hydronephrosis visualized. Bladder: Appears normal for degree of bladder distention. Other: None. IMPRESSION: Normal appearing kidneys bilaterally. Electronically Signed   By: Inez Catalina M.D.   On: 03/05/2021 11:34   DG CHEST PORT 1 VIEW  Result Date: 03/06/2021 CLINICAL DATA:  Central line placement. EXAM: PORTABLE CHEST 1 VIEW COMPARISON:  03/05/2021 FINDINGS: RIGHT IJ central line has been placed, tip overlying the level of the superior vena cava. Endotracheal tube is in place, tip 4.6 centimeters above the carina. Nasogastric tube is in place, tip beyond the image and beyond the level of the proximal stomach. Heart is mildly enlarged. Dense airspace filling opacities are identified throughout the UPPER lobes bilaterally, showing little change compared to prior studies. The lung bases are relatively spared. No pneumothorax following line placement. IMPRESSION: RIGHT IJ central line tip to the LOWER superior vena cava. No pneumothorax. Electronically Signed   By:  Nolon Nations M.D.   On: 03/06/2021 10:52   DG CHEST PORT 1 VIEW  Result Date: 03/06/2021 CLINICAL DATA:  Intubation. EXAM: PORTABLE CHEST 1 VIEW COMPARISON:  Radiograph earlier today. FINDINGS: Endotracheal tube tip 4.6 cm from the carina. Enteric tube is in place, better assessed on subsequent abdominal radiograph. Stable cardiomegaly. Bilateral perihilar airspace disease with central air bronchograms, left greater than right, not significantly changed from earlier today. No convincing pleural effusion. No pneumothorax. IMPRESSION: 1. Endotracheal tube tip 4.6 cm from the carina. Enteric tube in place, better assessed on subsequent abdominal radiograph. 2. Stable cardiomegaly and bilateral perihilar airspace disease/edema. Electronically Signed   By: Keith Rake M.D.   On: 03/06/2021 00:01   DG CHEST PORT 1 VIEW  Result Date: 03/05/2021 CLINICAL DATA:  Shortness of breath. EXAM: PORTABLE CHEST 1 VIEW COMPARISON:  March 04, 2021 FINDINGS: Predominant stable moderate to marked severity infiltrates are again seen, bilaterally. This is increased in severity when compared to the prior study. There is no evidence of a pleural effusion or pneumothorax. The cardiac silhouette is borderline in size. The visualized skeletal structures are unremarkable. IMPRESSION: Stable moderate to marked severity bilateral infiltrates. Electronically Signed   By: Virgina Norfolk M.D.   On: 03/05/2021 22:29   DG Chest Port 1 View  Result Date: 03/04/2021 CLINICAL DATA:  Shortness of breath EXAM: PORTABLE CHEST 1 VIEW COMPARISON:  02/13/2021 FINDINGS: Diffuse airspace disease throughout the left lung and also in the right upper lobe/perihilar region. Heart is mildly enlarged. No effusions or pneumothorax. No acute bony abnormality. IMPRESSION: Diffuse left lung airspace disease and airspace disease in the right upper lobe. Findings most compatible with multifocal pneumonia. This could conceivably represent asymmetric  edema. Electronically Signed   By: Rolm Baptise M.D.   On: 03/04/2021 19:09   ECHOCARDIOGRAM COMPLETE  Result Date: 03/05/2021    ECHOCARDIOGRAM REPORT   Patient Name:   MAXTON NOREEN Date of Exam: 03/05/2021 Medical Rec #:  [id number]      Height:       70.0 in Accession #:    [id number]     Weight:       200.0 lb Date of Birth:  August 23, 1976      BSA:          2.087 m Patient Age:    25 years       BP:           98/61 mmHg Patient Gender: M              HR:           91 bpm. Exam Location:  Inpatient Procedure: 2D Echo, Cardiac Doppler and Color Doppler Indications:    I50.33 Acute on chronic diastolic (congestive) heart failure  History:        Patient has no prior history of Echocardiogram examinations.                 Risk Factors:Hypertension, Diabetes and Dyslipidemia.  Sonographer:    Tiffany Dance Referring Phys: [id number] [place]  1. Left ventricular ejection fraction, by estimation, is 60 to 65%. The left ventricle has normal function. The left ventricle has no regional wall motion abnormalities. Left ventricular diastolic parameters were normal.  2. Right ventricular systolic function is low normal. The right ventricular size is normal. There is mildly elevated pulmonary artery systolic pressure.  3. Right atrial size was mildly dilated.  4. The mitral valve is normal in structure. No evidence of mitral valve regurgitation.  5. Tricuspid valve regurgitation is mild to moderate.  6. The aortic valve is normal in structure. Aortic valve regurgitation is not visualized. FINDINGS  Left Ventricle: Left ventricular ejection fraction, by estimation, is 60 to 65%. The left ventricle has normal function. The left ventricle has no regional wall motion abnormalities. The left ventricular internal cavity size was normal in size. There is  no left ventricular hypertrophy. Left ventricular diastolic parameters were normal. Right Ventricle: The right ventricular size is normal. Right vetricular wall  thickness was not well visualized. Right ventricular systolic function is low normal. There is mildly elevated pulmonary artery systolic pressure. The tricuspid regurgitant velocity is 2.85 m/s, and with an assumed right atrial pressure of 8 mmHg, the estimated right ventricular systolic pressure is 37.9 mmHg. Left Atrium: Left atrial size was normal in size. Right Atrium: Right atrial size was mildly dilated. Pericardium: There is no evidence of pericardial effusion. Mitral Valve: The mitral valve is normal in structure. No evidence of mitral valve regurgitation. Tricuspid Valve: The tricuspid valve is normal in structure. Tricuspid valve regurgitation is mild to moderate. Aortic Valve: The aortic valve is normal in structure. Aortic valve regurgitation is not visualized. Pulmonic Valve: The pulmonic valve was not well visualized. Pulmonic valve regurgitation is not visualized. Aorta: The aortic root and  ascending aorta are structurally normal, with no evidence of dilitation. IAS/Shunts: The atrial septum is grossly normal.  LEFT VENTRICLE PLAX 2D LVIDd:         4.40 cm  Diastology LVIDs:         2.80 cm  LV e' medial:    8.27 cm/s LV PW:         1.60 cm  LV E/e' medial:  6.5 LV IVS:        1.00 cm  LV e' lateral:   10.10 cm/s LVOT diam:     2.00 cm  LV E/e' lateral: 5.3 LV SV:         36 LV SV Index:   17 LVOT Area:     3.14 cm  RIGHT VENTRICLE             IVC RV Basal diam:  4.10 cm     IVC diam: 2.40 cm RV Mid diam:    3.40 cm RV S prime:     13.10 cm/s TAPSE (M-mode): 1.7 cm LEFT ATRIUM           Index       RIGHT ATRIUM           Index LA diam:      3.60 cm 1.72 cm/m  RA Area:     20.20 cm LA Vol (A2C): 61.8 ml 29.61 ml/m RA Volume:   64.80 ml  31.04 ml/m LA Vol (A4C): 39.1 ml 18.73 ml/m  AORTIC VALVE LVOT Vmax:   70.10 cm/s LVOT Vmean:  45.000 cm/s LVOT VTI:    0.115 m  AORTA Ao Root diam: 3.60 cm Ao Asc diam:  3.20 cm MITRAL VALVE               TRICUSPID VALVE MV Area (PHT): 3.91 cm    TR Peak grad:    32.5 mmHg MV Decel Time: 194 msec    TR Vmax:        285.00 cm/s MV E velocity: 53.90 cm/s MV A velocity: 69.20 cm/s  SHUNTS MV E/A ratio:  0.78        Systemic VTI:  0.12 m                            Systemic Diam: 2.00 cm Mertie Moores MD Electronically signed by Mertie Moores MD Signature Date/Time: 03/05/2021/3:09:52 PM    Final      Assessment/Plan: AIDS ARF (? HIVAN) Esophageal candidiasis DM2  Total days of antibiotics:  Cefepime 6/30--present Vancomycin 6/30--present Clindamycin 6/30--present Primaquine 6/30--present Fluconazole 7/1--present Methylprednisolone 6/30--present  Tivicay/lamivudine Central line, HD line placed 7-2 Intubated  7-1      Overall status explained to brother and mother Will resend his PJP DFA from trach aspirate Will watch his lung exam as his hopefully, at some point we are able to get fluid off.  Appreciate Dr Barkley Bruns f/u.  Watch LFTs on clinda/primaquine. Appreciate pharm f/u.  No change in his rx at this point.  He is very ill.   HIV 1 RNA Quant (copies/mL)  Date Value  02/12/2021 2,700,000        Bobby Rumpf MD, FACP Infectious Diseases (pager) [phone] [email or web address] 03/06/2021, 12:50 PM  LOS: 2 days

## 2021-03-06 NOTE — Progress Notes (Signed)
eLink Physician-Brief Progress Note Patient Name: YAZEED CULVER DOB: Jan 03, 1976 MRN: [id number]   Date of Service  03/06/2021  HPI/Events of Note  Nursing request for CXR in AM.  eICU Interventions  Plan: Portable CXR at 5 AM.     Intervention Category Major Interventions: Electrolyte abnormality - evaluation and management  [name] 03/06/2021, 9:45 PM

## 2021-03-06 NOTE — Progress Notes (Signed)
eLink Physician-Brief Progress Note Patient Name: Bruce Hunter DOB: Jan 25, 1976 MRN: [id number]   Date of Service  03/06/2021  HPI/Events of Note  Hypotension - Patient on Norepinephrine IV infusion in excess of ceiling rate.   eICU Interventions  Plan: Vasopressin IV infusion at shock dose. Phenylephrine IV infusion via PIV. Titrate to MAP >= 65. PCCM day rounding team will need to place CVL,     Intervention Category Major Interventions: Hypotension - evaluation and management  [name] 03/06/2021, 5:49 AM

## 2021-03-06 NOTE — Progress Notes (Signed)
eLink Physician-Brief Progress Note Patient Name: Bruce Hunter DOB: 09/25/1975 MRN: [id number]   Date of Service  03/06/2021  HPI/Events of Note  Agitation - Patient waking up and biting on ETT. Currently on Fentanyl IV infusion at 150 mcg/hour.  eICU Interventions  Plan" Fentanyl 30 mcg bolus from infusion Q 1 hours PRN agitation or sedation. Versed 1-2 mg Q 3 hours PRN agitation or sedation.      Intervention Category Major Interventions: Delirium, psychosis, severe agitation - evaluation and management  Tramaine Sauls [place] 03/06/2021, 2:35 AM

## 2021-03-06 NOTE — Progress Notes (Signed)
eLink Physician-Brief Progress Note Patient Name: Bruce Hunter DOB: [birth year] MRN: [id number]   Date of Service  03/06/2021  HPI/Events of Note  Multiple issues: 1. ABG on 100%/PRVC 16/TV 500/P 10 = 7.161/51.5/116/17.6 2. Evidently PRVC rate not increased as ordered. Hyperglycemia - Blood glucose = 234.  eICU Interventions  Plan: Increase PRVC rate to 29. Repeat ABG at 8 AM. Decrease D10W IV infusion to 25 mL/hour.     Intervention Category Major Interventions: Respiratory failure - evaluation and management;Acid-Base disturbance - evaluation and management  Takeyla Million [place] 03/06/2021, 4:57 AM

## 2021-03-06 NOTE — Progress Notes (Signed)
eLink Physician-Brief Progress Note Patient Name: Bruce Hunter DOB: [birth year] MRN: [id number]   Date of Service  03/06/2021  HPI/Events of Note  ABG on 100%/PRVC 16/TV 500/P 10 = 7.097/66.9/75.9/19.7  eICU Interventions  Plan: Increase PRVC rate to 29. Repeat ABG at 4 AM.     Intervention Category Major Interventions: Respiratory failure - evaluation and management;Acid-Base disturbance - evaluation and management  Carole Deere [place] 03/06/2021, 1:18 AM

## 2021-03-06 NOTE — Progress Notes (Signed)
Pt unstable on ventilator and had to titrate sedation and pain medications with E-link MD differently than ordered titration rates to get patient to right level.  Versed added for PRN sedation to not fight ventilator.

## 2021-03-06 NOTE — Progress Notes (Signed)
CXR reivewed post HD cath placement, line in good position, no pneumothorax noted.  ETT in good position. Possible RUL infiltrate, with bilateral central interstitial & alveolar infiltrates. Edema vs airspace disease.  HD cath ok for use.     Bruce Gens, MSN, APRN, NP-C, AGACNP-BC [facility] 03/06/2021, 10:37 AM   Please see [email or web address] for pager details.   From 7A-7P if no response, please call [phone] After hours, please call ELink [phone]

## 2021-03-07 ENCOUNTER — Inpatient Hospital Stay (HOSPITAL_COMMUNITY): Payer: Medicaid Other

## 2021-03-07 DIAGNOSIS — R7989 Other specified abnormal findings of blood chemistry: Secondary | ICD-10-CM | POA: Diagnosis not present

## 2021-03-07 DIAGNOSIS — J9601 Acute respiratory failure with hypoxia: Secondary | ICD-10-CM | POA: Diagnosis not present

## 2021-03-07 DIAGNOSIS — B20 Human immunodeficiency virus [HIV] disease: Secondary | ICD-10-CM | POA: Diagnosis not present

## 2021-03-07 DIAGNOSIS — B59 Pneumocystosis: Secondary | ICD-10-CM | POA: Diagnosis not present

## 2021-03-07 DIAGNOSIS — N179 Acute kidney failure, unspecified: Secondary | ICD-10-CM | POA: Diagnosis not present

## 2021-03-07 DIAGNOSIS — E1142 Type 2 diabetes mellitus with diabetic polyneuropathy: Secondary | ICD-10-CM

## 2021-03-07 LAB — BLOOD GAS, ARTERIAL
Acid-base deficit: 6.5 mmol/L — ABNORMAL HIGH (ref 0.0–2.0)
Bicarbonate: 19 mmol/L — ABNORMAL LOW (ref 20.0–28.0)
Drawn by: 308601
FIO2: 50
MECHVT: 550 mL
O2 Saturation: 97 %
PEEP: 8 cmH2O
Patient temperature: 98.6
RATE: 24 resp/min
pCO2 arterial: 41.1 mmHg (ref 32.0–48.0)
pH, Arterial: 7.287 — ABNORMAL LOW (ref 7.350–7.450)
pO2, Arterial: 118 mmHg — ABNORMAL HIGH (ref 83.0–108.0)

## 2021-03-07 LAB — GLUCOSE, CAPILLARY
Glucose-Capillary: 158 mg/dL — ABNORMAL HIGH (ref 70–99)
Glucose-Capillary: 185 mg/dL — ABNORMAL HIGH (ref 70–99)
Glucose-Capillary: 212 mg/dL — ABNORMAL HIGH (ref 70–99)
Glucose-Capillary: 194 mg/dL — ABNORMAL HIGH (ref 70–99)
Glucose-Capillary: 198 mg/dL — ABNORMAL HIGH (ref 70–99)

## 2021-03-07 LAB — RENAL FUNCTION PANEL
Albumin: 2 g/dL — ABNORMAL LOW (ref 3.5–5.0)
Albumin: 2.1 g/dL — ABNORMAL LOW (ref 3.5–5.0)
Anion gap: 10 (ref 5–15)
Anion gap: 6 (ref 5–15)
BUN: 72 mg/dL — ABNORMAL HIGH (ref 6–20)
BUN: 59 mg/dL — ABNORMAL HIGH (ref 6–20)
CO2: 21 mmol/L — ABNORMAL LOW (ref 22–32)
CO2: 24 mmol/L (ref 22–32)
Calcium: 7.8 mg/dL — ABNORMAL LOW (ref 8.9–10.3)
Calcium: 7.8 mg/dL — ABNORMAL LOW (ref 8.9–10.3)
Chloride: 103 mmol/L (ref 98–111)
Chloride: 105 mmol/L (ref 98–111)
Creatinine, Ser: 3.99 mg/dL — ABNORMAL HIGH (ref 0.61–1.24)
Creatinine, Ser: 3.05 mg/dL — ABNORMAL HIGH (ref 0.61–1.24)
GFR, Estimated: 18 mL/min — ABNORMAL LOW (ref >60)
GFR, Estimated: 25 mL/min — ABNORMAL LOW (ref >60)
Glucose, Bld: 194 mg/dL — ABNORMAL HIGH (ref 70–99)
Glucose, Bld: 192 mg/dL — ABNORMAL HIGH (ref 70–99)
Phosphorus: 7.4 mg/dL — ABNORMAL HIGH (ref 2.5–4.6)
Phosphorus: 6.1 mg/dL — ABNORMAL HIGH (ref 2.5–4.6)
Potassium: 5.7 mmol/L — ABNORMAL HIGH (ref 3.5–5.1)
Potassium: 5.1 mmol/L (ref 3.5–5.1)
Sodium: 134 mmol/L — ABNORMAL LOW (ref 135–145)
Sodium: 135 mmol/L (ref 135–145)

## 2021-03-07 LAB — CBC
HCT: 21.8 % — ABNORMAL LOW (ref 39.0–52.0)
Hemoglobin: 6.3 g/dL — CL (ref 13.0–17.0)
MCH: 29.6 pg (ref 26.0–34.0)
MCHC: 28.9 g/dL — ABNORMAL LOW (ref 30.0–36.0)
MCV: 102.3 fL — ABNORMAL HIGH (ref 80.0–100.0)
Platelets: 206 10*3/uL (ref 150–400)
RBC: 2.13 MIL/uL — ABNORMAL LOW (ref 4.22–5.81)
RDW: 18.2 % — ABNORMAL HIGH (ref 11.5–15.5)
WBC: 12.8 10*3/uL — ABNORMAL HIGH (ref 4.0–10.5)
nRBC: 2.4 % — ABNORMAL HIGH (ref 0.0–0.2)

## 2021-03-07 LAB — MAGNESIUM
Magnesium: 2.2 mg/dL (ref 1.7–2.4)
Magnesium: 2.3 mg/dL (ref 1.7–2.4)

## 2021-03-07 LAB — HEMOGLOBIN AND HEMATOCRIT, BLOOD
HCT: 20.9 % — ABNORMAL LOW (ref 39.0–52.0)
Hemoglobin: 6.2 g/dL — CL (ref 13.0–17.0)

## 2021-03-07 LAB — APTT: aPTT: 36 seconds (ref 24–36)

## 2021-03-07 LAB — PREPARE RBC (CROSSMATCH)

## 2021-03-07 LAB — ABO/RH: ABO/RH(D): O POS

## 2021-03-07 IMAGING — DX DG CHEST 1V PORT
1 series · 1 of 1 positions shown · non-contrast
Comparison: [DATE].

CLINICAL DATA: Acute hypoxemic respiratory failure.

EXAM:
PORTABLE CHEST 1 VIEW

[chest ap]
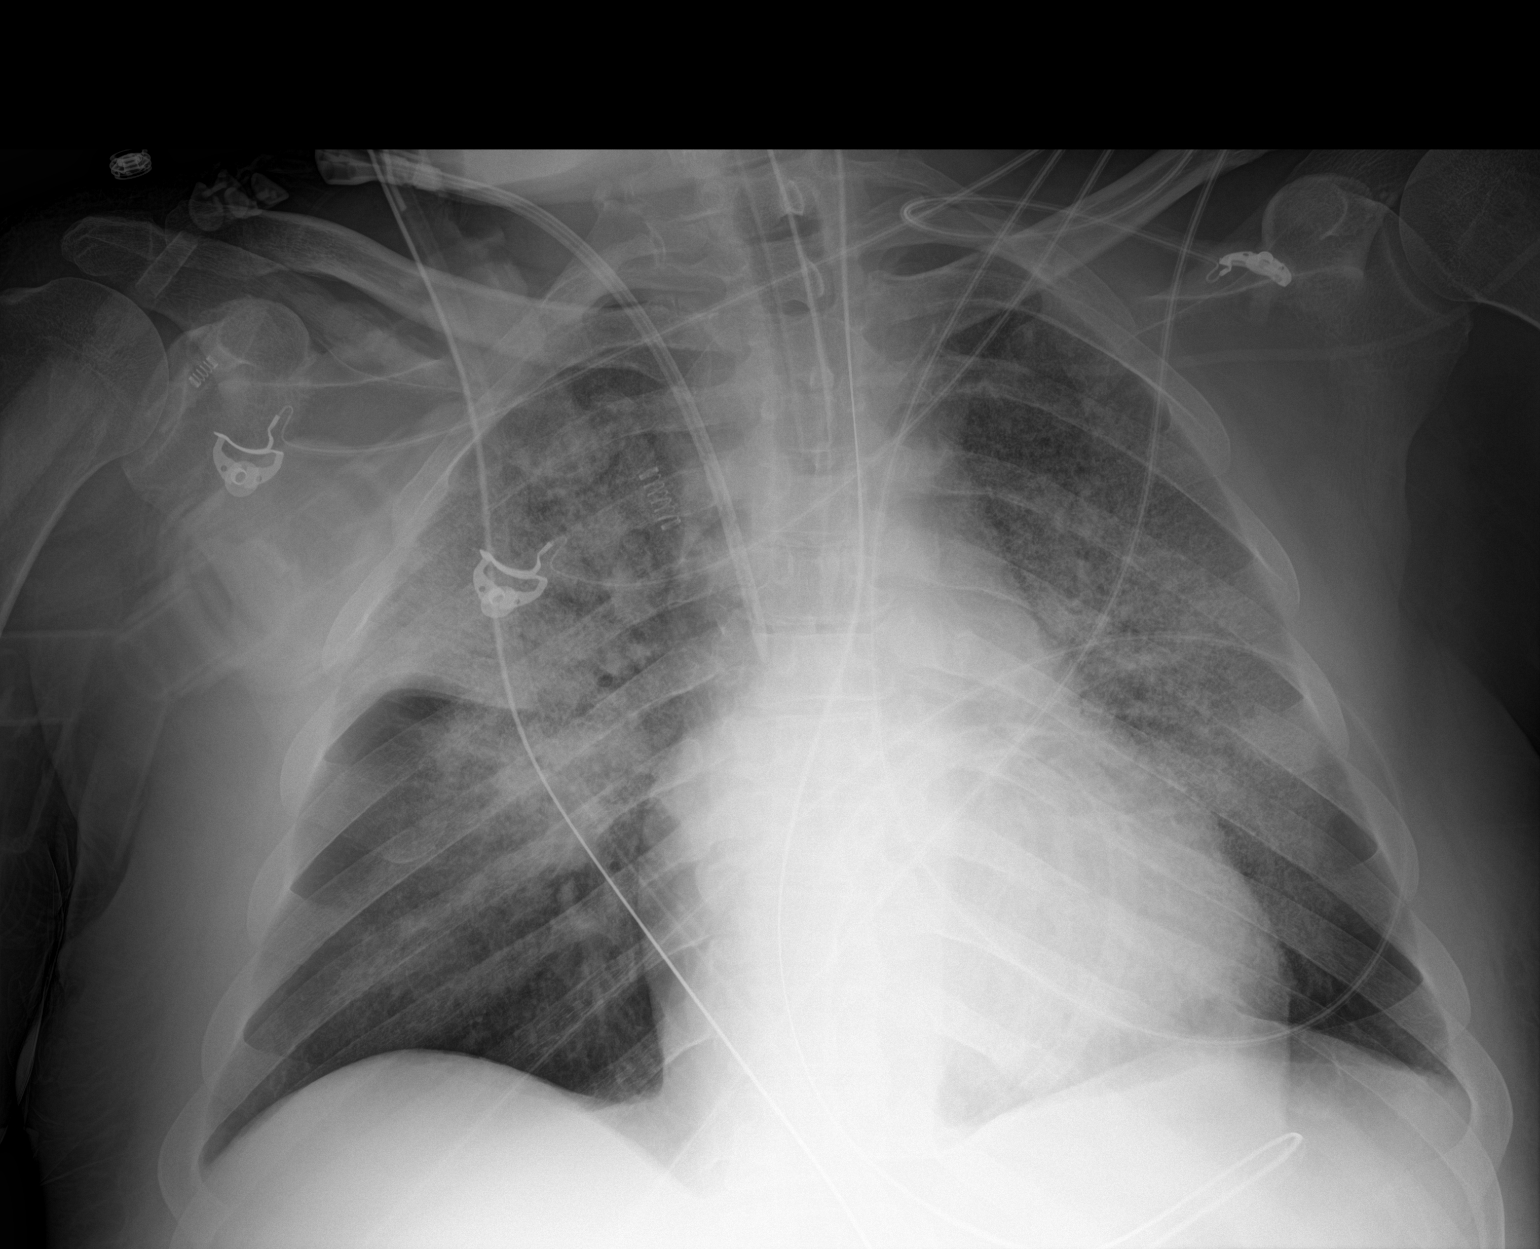

[1 of 1 positions shown; findings below may reference images not displayed]

FINDINGS: Stable cardiomediastinal silhouette. Endotracheal and nasogastric
tubes are unchanged in position. Right internal jugular catheter is
unchanged. No pneumothorax or pleural effusion is noted. Stable
bilateral upper lobe opacities are noted concerning for pneumonia.
Bony thorax is unremarkable.
IMPRESSION: Stable support apparatus. Stable bilateral upper lobe opacities are
noted concerning for pneumonia.

## 2021-03-07 MED ORDER — PANTOPRAZOLE SODIUM 40 MG PO PACK
40.0000 mg | PACK | Freq: Every day | ORAL | Status: DC
  Administered 2021-03-07 – 2021-03-21 (×15): 40 mg
  Filled 2021-03-07 (×17): qty 20

## 2021-03-07 MED ORDER — VITAL HIGH PROTEIN PO LIQD
1000.0000 mL | ORAL | Status: AC
  Administered 2021-03-07 – 2021-03-08 (×2): 1000 mL

## 2021-03-07 MED ORDER — PROSOURCE TF PO LIQD
45.0000 mL | Freq: Two times a day (BID) | ORAL | Status: DC
  Administered 2021-03-07 – 2021-03-08 (×3): 45 mL
  Filled 2021-03-07 (×3): qty 45

## 2021-03-07 MED ORDER — PRISMASOL BGK 0/2.5 32-2.5 MEQ/L EC SOLN
Status: DC
  Filled 2021-03-07 (×11): qty 5000

## 2021-03-07 MED ORDER — STERILE WATER FOR INJECTION IJ SOLN
20.0000 mL | Freq: Once | INTRAMUSCULAR | Status: DC
  Administered 2021-03-07: 20 mL via INTRAMUSCULAR

## 2021-03-07 MED ORDER — STERILE WATER FOR INJECTION IJ SOLN
INTRAMUSCULAR | Status: AC
  Filled 2021-03-07: qty 10

## 2021-03-07 MED ORDER — ALTEPLASE 2 MG IJ SOLR
2.0000 mg | Freq: Once | INTRAMUSCULAR | Status: AC
  Administered 2021-03-07: 2 mg
  Filled 2021-03-07: qty 2

## 2021-03-07 MED ORDER — SODIUM CHLORIDE 0.9% IV SOLUTION: Freq: Once | INTRAVENOUS | Status: AC

## 2021-03-07 MED ORDER — PRISMASOL BGK 0/2.5 32-2.5 MEQ/L EC SOLN
Status: DC
  Filled 2021-03-07 (×4): qty 5000

## 2021-03-07 MED ORDER — SODIUM CHLORIDE 0.9% FLUSH
10.0000 mL | Freq: Two times a day (BID) | INTRAVENOUS | Status: DC
  Administered 2021-03-07 (×2): 10 mL
  Administered 2021-03-08: 40 mL
  Administered 2021-03-08 – 2021-03-09 (×2): 10 mL
  Administered 2021-03-09 – 2021-03-10 (×2): 20 mL
  Administered 2021-03-10 – 2021-03-11 (×3): 10 mL
  Administered 2021-03-12: 20 mL
  Administered 2021-03-12: 10 mL
  Administered 2021-03-13: 20 mL
  Administered 2021-03-13 – 2021-03-30 (×31): 10 mL

## 2021-03-07 MED ORDER — STERILE WATER FOR INJECTION IJ SOLN
INTRAMUSCULAR | Status: AC
  Filled 2021-03-07: qty 20

## 2021-03-07 MED ORDER — SODIUM CHLORIDE 0.9% FLUSH: 10.0000 mL | INTRAVENOUS | Status: DC | PRN

## 2021-03-07 MED ORDER — PRIMAQUINE PHOSPHATE 26.3 (15 BASE) MG PO TABS
30.0000 mg | ORAL_TABLET | ORAL | Status: DC
  Administered 2021-03-07 – 2021-03-08 (×2): 30 mg
  Filled 2021-03-07 (×2): qty 2

## 2021-03-07 MED ORDER — GABAPENTIN 250 MG/5ML PO SOLN
300.0000 mg | Freq: Three times a day (TID) | ORAL | Status: DC
  Administered 2021-03-07 – 2021-03-14 (×21): 300 mg
  Filled 2021-03-07 (×22): qty 6

## 2021-03-07 NOTE — Progress Notes (Signed)
eLink Physician-Brief Progress Note Patient Name: Bruce Hunter DOB: [birth year] MRN: [id number]   Date of Service  03/07/2021  HPI/Events of Note  Anemia - Hgb = 6.2.   eICU Interventions  Plan: Will transfuse 1 unit PRBC now.      Intervention Category Major Interventions: Other:  [name] 03/07/2021, 11:52 PM

## 2021-03-07 NOTE — Progress Notes (Signed)
Updated mom from today's activities and she stated she will come by tomorrow to visit

## 2021-03-07 NOTE — Plan of Care (Signed)
Discussed in front of patient plan of care for the evening, pain management and CRRT with no evidence of learning at this time.  Problem: Education: Goal: Knowledge of General Education information will improve Description: Including pain rating scale, medication(s)/side effects and non-pharmacologic comfort measures Outcome: Progressing

## 2021-03-07 NOTE — Progress Notes (Signed)
[facility] Progress Note  Subjective: coming down on pressors abit today, 200 cc UOP yest  Vitals:   03/07/21 1715 03/07/21 1730 03/07/21 1745 03/07/21 1800  BP: 106/65 109/69 113/73 113/71  Pulse: 96 96 93 93  Resp: (!) 24 (!) 24 (!) 24 (!) 24  Temp:      TempSrc:      SpO2: 91% 90% 92% 93%  Weight:      Height:        Exam: on vent ,sedated  no jvd  throat ett in place  Chest cta bilat and lat  Cor reg no RG  Abd soft ntnd no ascites   Ext 1+ ankle / pedal edema bilat   Neuro on vent and sedated         Home meds:  - norvasc/ hctz/ lisinopril  - epivir/ dolutegravir/ lamivudine/ dapsone  - klonopin 0.5 tid prn/ neurontin 600 tid/ norvco prn/ mobic 15 qd/ ultram prn/ ambien prn  - prilosec /diflucan  - prn's/ vitamins/ supplements     UA 6/30 - negative    UNa 52,  UCr 150     Renal US - 11 and 14 cm kidneys w/o hydro           Date                       Creat               eGFR     June 2021               2.80                      June 2022               4.9 >> 2.74      16- 30 ml/min       Assessment/ Plan: AKI on CKD IV - b/l creat from June 2022 is 2.5- 3.0.  Creat here is 5.5 in setting of acute resp distress/ multifocal pna/ WBC 24k. Pt was on ACEi and COX II inhibitor at home. Suspect volume depletion / septic shock/ acei/ COX II inhibitor causing hemodynamic AKI / ATN. Renal US nonobstructive, UA negative. CRRT started on 7/02, labs improving. Keeping even as remains on pressor support.   Volume - mild vol overload on exam. CXR's more c/w infection than fluid.  Pneumonia - on multple IV abx per CCM and ID. HIV - recently diagnosed and advanced Shock - on multiple pressors          Rob Eylin Pontarelli 03/07/2021, 6:14 PM   Recent Labs  Lab 03/06/21 1150 03/06/21 1750 03/07/21 0452 03/07/21 1530  K  --    < > 5.7* 5.1  BUN  --    < > 72* 59*  CREATININE  --    < > 3.99* 3.05*  CALCIUM  --    < > 7.8* 7.8*  PHOS  --    < > 7.4*  6.1*  HGB 7.3*  --  6.3*  --    < > = values in this interval not displayed.   Inpatient medications:  chlorhexidine gluconate (MEDLINE KIT)  15 mL Mouth Rinse BID   Chlorhexidine Gluconate Cloth  6 each Topical Daily   dolutegravir  50 mg Per Tube Daily   feeding supplement (PROSource TF)  45 mL Per Tube BID   feeding supplement (VITAL HIGH PROTEIN)  1,000 mL Per Tube Q24H   gabapentin  300 mg Per Tube Q8H   heparin  5,000 Units Subcutaneous Q8H   insulin aspart  0-15 Units Subcutaneous Q4H   ipratropium-albuterol  3 mL Nebulization Q4H   lamiVUDine  150 mg Per Tube Daily   mouth rinse  15 mL Mouth Rinse 10 times per day   methylPREDNISolone (SOLU-MEDROL) injection  40 mg Intravenous Q24H   pantoprazole sodium  40 mg Per Tube QHS   primaquine  30 mg Per Tube Q24H   sodium chloride flush  10-40 mL Intracatheter Q12H    sodium chloride 10 mL/hr at 03/07/21 1800   sodium chloride Stopped (03/07/21 1105)   ceFEPime (MAXIPIME) IV Stopped (03/07/21 1148)   clindamycin (CLEOCIN) IV Stopped (03/07/21 1346)   fentaNYL infusion INTRAVENOUS 400 mcg/hr (03/07/21 1800)   fluconazole (DIFLUCAN) IV Stopped (03/07/21 0008)   midazolam 5 mg/hr (03/07/21 1800)   norepinephrine (LEVOPHED) Adult infusion 6 mcg/min (03/07/21 1805)   prismasol BGK 2/2.5 replacement solution 400 mL/hr at 03/07/21 1008   prismasol BGK 2/2.5 replacement solution 200 mL/hr at 03/07/21 1011   prismasol BGK 4/2.5 1,800 mL/hr at 03/07/21 1550   vasopressin 0.03 Units/min (03/07/21 1800)   acetaminophen, albuterol, alteplase, docusate sodium, fentaNYL, guaiFENesin, heparin, midazolam, ondansetron (ZOFRAN) IV, polyethylene glycol, sodium chloride, sodium chloride flush

## 2021-03-07 NOTE — Progress Notes (Signed)
Notified Lab that ABG being sent for analysis. 

## 2021-03-07 NOTE — Progress Notes (Signed)
Brief Nutrition Note  Consult received for enteral/tube feeding initiation and management. Pt with OG tube in stomach per abdominal x-ray on 7/01. Chest x-ray from this morning reports OG tube position is unchanged.  Adult Enteral Nutrition Protocol initiated. Full assessment to follow.  Admitting Dx: Hyperkalemia [E87.5] Pneumonia [J18.9] Hypoxia [R09.02] AKI (acute kidney injury) ([place]) [N17.9] Multifocal pneumonia [J18.9] HIV infection, unspecified symptom status ([place]) [B20]  Body mass index is 29.21 kg/m. Pt meets criteria for overweight based on current BMI.  Labs:  Recent Labs  Lab 03/06/21 1031 03/06/21 1750 03/07/21 0452  NA 132* 131* 134*  K 6.0* 5.5* 5.7*  CL 102 100 103  CO2 17* 19* 21*  BUN 104* 89* 72*  CREATININE 5.39* 4.76* 3.99*  CALCIUM 7.5* 7.5* 7.8*  MG  --   --  2.2  PHOS 11.1* 8.7* 7.4*  GLUCOSE 358* 335* 194*    Gustavus Bryant, MS, RD, LDN Inpatient Clinical Dietitian Please see AMiON for contact information.

## 2021-03-07 NOTE — Progress Notes (Signed)
Patient's CRRT stopped due to a machine malfunction but when restarting his access port wouldn't flow IV team notified.  IV team did check port before restarting with another set earlier and flow was great at the time.  But due to location maybe clotted or pushing on a vein.  HD port redressed and patient beard partially shaved.

## 2021-03-07 NOTE — Progress Notes (Signed)
INFECTIOUS DISEASE PROGRESS NOTE  ID: Bruce Hunter is a 45 y.o. 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   Abtx:  Anti-infectives (From admission, onward)    Start     Dose/Rate Route Frequency Ordered Stop   03/07/21 2200  primaquine tablet 30 mg        30 mg Per Tube Every 24 hours 03/07/21 0915     03/07/21 1000  dolutegravir (TIVICAY) tablet 50 mg        50 mg Per Tube Daily 03/06/21 1132     03/07/21 1000  lamiVUDine (EPIVIR) tablet 150 mg        150 mg Per Tube Daily 03/06/21 1324     03/06/21 2200  fluconazole (DIFLUCAN) IVPB 400 mg        400 mg 100 mL/hr over 120 Minutes Intravenous Every 24 hours 03/06/21 1251     03/06/21 1400  ceFEPIme (MAXIPIME) 2 g in sodium chloride 0.9 % 100 mL IVPB        2 g 200 mL/hr over 30 Minutes Intravenous Every 12 hours 03/06/21 1251     03/05/21 2130  fluconazole (DIFLUCAN) IVPB 200 mg  Status:  Discontinued        200 mg 100 mL/hr over 60 Minutes Intravenous Every 24 hours 03/05/21 0810 03/06/21 1251   03/05/21 1000  dolutegravir (TIVICAY) tablet 50 mg  Status:  Discontinued        50 mg Oral Daily 03/04/21 2214 03/06/21 1132   03/05/21 1000  lamiVUDine (EPIVIR) tablet 300 mg  Status:  Discontinued        300 mg Oral Daily 03/04/21 2217 03/04/21 2217   03/05/21 1000  lamiVUDine (EPIVIR) tablet 150 mg  Status:  Discontinued        150 mg Oral Daily 03/04/21 2217 03/06/21 1324   03/04/21 2200  clindamycin (CLEOCIN) IVPB 900 mg        900 mg 100 mL/hr over 30 Minutes Intravenous Every 8 hours 03/04/21 2157     03/04/21 2200  primaquine tablet 30 mg  Status:  Discontinued        30 mg Oral  Every 24 hours 03/04/21 2157 03/07/21 0915   03/04/21 2145  vancomycin (VANCOREADY) IVPB 1500 mg/300 mL        1,500 mg 150 mL/hr over 120 Minutes Intravenous  Once 03/04/21 2144 03/05/21 0050   03/04/21 2145  ceFEPIme (MAXIPIME) 2 g in sodium chloride 0.9 % 100 mL IVPB  Status:  Discontinued        2 g 200 mL/hr over 30 Minutes Intravenous Every 24 hours 03/04/21 2144 03/06/21 1251   03/04/21 2143  vancomycin variable dose per unstable renal function (pharmacist dosing)  Status:  Discontinued         Does not apply See admin instructions 03/04/21 2144 03/06/21 0813   03/04/21 2130  fluconazole (DIFLUCAN) IVPB 400 mg  Status:  Discontinued        400 mg 100 mL/hr over 120 Minutes Intravenous Every 24 hours 03/04/21 2126 03/05/21 0810   03/04/21 1915  cefTRIAXone (ROCEPHIN) 1 g in sodium chloride 0.9 % 100  mL IVPB        1 g 200 mL/hr over 30 Minutes Intravenous  Once 03/04/21 1914 03/04/21 2045   03/04/21 1915  azithromycin (ZITHROMAX) 500 mg in sodium chloride 0.9 % 250 mL IVPB        500 mg 250 mL/hr over 60 Minutes Intravenous  Once 03/04/21 1914 03/04/21 2201       Medications: Scheduled:  sodium chloride   Intravenous Once   chlorhexidine gluconate (MEDLINE KIT)  15 mL Mouth Rinse BID   Chlorhexidine Gluconate Cloth  6 each Topical Daily   dolutegravir  50 mg Per Tube Daily   feeding supplement (PROSource TF)  45 mL Per Tube BID   feeding supplement (VITAL HIGH PROTEIN)  1,000 mL Per Tube Q24H   gabapentin  300 mg Per Tube Q8H   heparin  5,000 Units Subcutaneous Q8H   insulin aspart  0-15 Units Subcutaneous Q4H   ipratropium-albuterol  3 mL Nebulization Q4H   lamiVUDine  150 mg Per Tube Daily   mouth rinse  15 mL Mouth Rinse 10 times per day   methylPREDNISolone (SOLU-MEDROL) injection  40 mg Intravenous Q24H   pantoprazole sodium  40 mg Per Tube QHS   primaquine  30 mg Per Tube Q24H   sodium chloride flush  10-40 mL Intracatheter Q12H    Objective: Vital signs in last  24 hours: Temp:  [97.4 F (36.3 C)-99 F (37.2 C)] 98.3 F (36.8 C) (07/03 1105) Pulse Rate:  [91-124] 97 (07/03 1108) Resp:  [0-32] 24 (07/03 1108) BP: (92-145)/(51-81) 111/70 (07/03 1108) SpO2:  [91 %-100 %] 96 % (07/03 1108) FiO2 (%):  [50 %-70 %] 50 % (07/03 1108) Weight:  [95 kg] 95 kg (07/03 0447)   General appearance: no distress Neck: R neck HD line, clean, biopatch in place Resp: rhonchi anterior - bilateral Cardio: regularly irregular rhythm GI: normal findings: soft, non-tender and abnormal findings:  hypoactive bowel sounds Extremities: edema diffuse  Lab Results Recent Labs    03/06/21 1150 03/06/21 1750 03/07/21 0452  WBC 12.3*  --  12.8*  HGB 7.3*  --  6.3*  HCT 25.4*  --  21.8*  NA  --  131* 134*  K  --  5.5* 5.7*  CL  --  100 103  CO2  --  19* 21*  BUN  --  89* 72*  CREATININE  --  4.76* 3.99*   Liver Panel Recent Labs    03/04/21 1814 03/05/21 0130 03/06/21 1031 03/06/21 1750 03/07/21 0452  PROT 8.4* 7.2  --   --   --   ALBUMIN 2.0* 1.8*   < > 2.0* 2.0*  AST 70* 54*  --   --   --   ALT 32 26  --   --   --   ALKPHOS 167* 145*  --   --   --   BILITOT 0.6 0.7  --   --   --    < > = values in this interval not displayed.   Sedimentation Rate No results for input(s): ESRSEDRATE in the last 72 hours. C-Reactive Protein No results for input(s): CRP in the last 72 hours.  Microbiology: Recent Results (from the past 240 hour(s))  Culture, blood (routine x 2)     Status: None (Preliminary result)   Collection Time: 03/04/21  6:14 PM   Specimen: BLOOD LEFT FOREARM  Result Value Ref Range Status   Specimen Description   Final    BLOOD LEFT  FOREARM Performed at [facility], [place] [address], [place], [place] [address]    Special Requests   Final    BOTTLES DRAWN AEROBIC AND ANAEROBIC Blood Culture adequate volume Performed at [facility] [address] [place], [place] [address]    Culture   Final     NO GROWTH 3 DAYS Performed at [facility], [place] [address], [place], [place] [address]    Report Status PENDING  Incomplete  Resp Panel by RT-PCR (Flu A&B, Covid) Nasopharyngeal Swab     Status: None   Collection Time: 03/04/21  7:52 PM   Specimen: Nasopharyngeal Swab; Nasopharyngeal(NP) swabs in vial transport medium  Result Value Ref Range Status   SARS Coronavirus 2 by RT PCR NEGATIVE NEGATIVE Final    Comment: (NOTE) SARS-CoV-2 target nucleic acids are NOT DETECTED.  The SARS-CoV-2 RNA is generally detectable in upper respiratory specimens during the acute phase of infection. The lowest concentration of SARS-CoV-2 viral copies this assay can detect is 138 copies/mL. A negative result does not preclude SARS-Cov-2 infection and should not be used as the sole basis for treatment or other patient management decisions. A negative result may occur with  improper specimen collection/handling, submission of specimen other than nasopharyngeal swab, presence of viral mutation(s) within the areas targeted by this assay, and inadequate number of viral copies(<138 copies/mL). A negative result must be combined with clinical observations, patient history, and epidemiological information. The expected result is Negative.  Fact Sheet for Patients:  EntrepreneurPulse.com.au  Fact Sheet for Healthcare Providers:  IncredibleEmployment.be  This test is no t yet approved or cleared by the [place] FDA and  has been authorized for detection and/or diagnosis of SARS-CoV-2 by FDA under an Emergency Use Authorization (EUA). This EUA will remain  in effect (meaning this test can be used) for the duration of the COVID-19 declaration under Section 564(b)(1) of the Act, 21 U.S.C.section 360bbb-3(b)(1), unless the authorization is terminated  or revoked sooner.       Influenza A by PCR NEGATIVE NEGATIVE Final   Influenza B by PCR NEGATIVE NEGATIVE Final     Comment: (NOTE) The Xpert Xpress SARS-CoV-2/FLU/RSV plus assay is intended as an aid in the diagnosis of influenza from Nasopharyngeal swab specimens and should not be used as a sole basis for treatment. Nasal washings and aspirates are unacceptable for Xpert Xpress SARS-CoV-2/FLU/RSV testing.  Fact Sheet for Patients: EntrepreneurPulse.com.au  Fact Sheet for Healthcare Providers: IncredibleEmployment.be  This test is not yet approved or cleared by the [place] FDA and has been authorized for detection and/or diagnosis of SARS-CoV-2 by FDA under an Emergency Use Authorization (EUA). This EUA will remain in effect (meaning this test can be used) for the duration of the COVID-19 declaration under Section 564(b)(1) of the Act, 21 U.S.C. section 360bbb-3(b)(1), unless the authorization is terminated or revoked.  Performed at [facility], [place] [address], [place], [place] [address]   MRSA PCR Screening     Status: None   Collection Time: 03/05/21 10:41 AM  Result Value Ref Range Status   MRSA by PCR NEGATIVE NEGATIVE Final    Comment:        The GeneXpert MRSA Assay (FDA approved for NASAL specimens only), is one component of a comprehensive MRSA colonization surveillance program. It is not intended to diagnose MRSA infection nor to guide or monitor treatment for MRSA infections. Performed at [facility], [address] [place], [place]  [address]   Respiratory (~20 pathogens) panel by PCR     Status: None   Collection Time: 03/05/21 10:42 AM   Specimen: Nasopharyngeal Swab; Respiratory  Result Value Ref Range Status   Adenovirus NOT DETECTED NOT DETECTED Final   Coronavirus 229E NOT DETECTED NOT DETECTED Final    Comment: (NOTE) The Coronavirus on the Respiratory Panel, DOES NOT test for the novel  Coronavirus (2019 nCoV)    Coronavirus HKU1 NOT DETECTED NOT DETECTED Final   Coronavirus  NL63 NOT DETECTED NOT DETECTED Final   Coronavirus OC43 NOT DETECTED NOT DETECTED Final   Metapneumovirus NOT DETECTED NOT DETECTED Final   Rhinovirus / Enterovirus NOT DETECTED NOT DETECTED Final   Influenza A NOT DETECTED NOT DETECTED Final   Influenza B NOT DETECTED NOT DETECTED Final   Parainfluenza Virus 1 NOT DETECTED NOT DETECTED Final   Parainfluenza Virus 2 NOT DETECTED NOT DETECTED Final   Parainfluenza Virus 3 NOT DETECTED NOT DETECTED Final   Parainfluenza Virus 4 NOT DETECTED NOT DETECTED Final   Respiratory Syncytial Virus NOT DETECTED NOT DETECTED Final   Bordetella pertussis NOT DETECTED NOT DETECTED Final   Bordetella Parapertussis NOT DETECTED NOT DETECTED Final   Chlamydophila pneumoniae NOT DETECTED NOT DETECTED Final   Mycoplasma pneumoniae NOT DETECTED NOT DETECTED Final    Comment: Performed at [facility], [place]. [address] [place], [place] [address]  Gastrointestinal Panel by PCR , Stool     Status: None   Collection Time: 03/05/21  6:28 PM   Specimen: Stool  Result Value Ref Range Status   Campylobacter species NOT DETECTED NOT DETECTED Final   Plesimonas shigelloides NOT DETECTED NOT DETECTED Final   Salmonella species NOT DETECTED NOT DETECTED Final   Yersinia enterocolitica NOT DETECTED NOT DETECTED Final   Vibrio species NOT DETECTED NOT DETECTED Final   Vibrio cholerae NOT DETECTED NOT DETECTED Final   Enteroaggregative E coli (EAEC) NOT DETECTED NOT DETECTED Final   Enteropathogenic E coli (EPEC) NOT DETECTED NOT DETECTED Final   Enterotoxigenic E coli (ETEC) NOT DETECTED NOT DETECTED Final   Shiga like toxin producing E coli (STEC) NOT DETECTED NOT DETECTED Final   Shigella/Enteroinvasive E coli (EIEC) NOT DETECTED NOT DETECTED Final   Cryptosporidium NOT DETECTED NOT DETECTED Final   Cyclospora cayetanensis NOT DETECTED NOT DETECTED Final   Entamoeba histolytica NOT DETECTED NOT DETECTED Final   Giardia lamblia NOT DETECTED NOT DETECTED Final    Adenovirus F40/41 NOT DETECTED NOT DETECTED Final   Astrovirus NOT DETECTED NOT DETECTED Final   Norovirus GI/GII NOT DETECTED NOT DETECTED Final   Rotavirus A NOT DETECTED NOT DETECTED Final   Sapovirus (I, II, IV, and V) NOT DETECTED NOT DETECTED Final    Comment: Performed at [facility], [place] [place], [place] [address]  Culture, Respiratory w Gram Stain     Status: None (Preliminary result)   Collection Time: 03/06/21  1:10 PM   Specimen: Tracheal Aspirate; Respiratory  Result Value Ref Range Status   Specimen Description   Final    TRACHEAL ASPIRATE Performed at [facility] [address] [place], [place] [address]    Special Requests   Final    Immunocompromised Performed at [facility], [place] [address], [place], [place] [address]    Gram Stain   Final    FEW WBC PRESENT,BOTH PMN AND MONONUCLEAR RARE SQUAMOUS EPITHELIAL CELLS PRESENT RARE GRAM POSITIVE RODS RARE GRAM POSITIVE COCCI IN PAIRS Performed  at [facility], [place] [address] [place], [place] [address]    Culture PENDING  Incomplete   Report Status PENDING  Incomplete    Studies/Results: DG Abd 1 View  Result Date: 03/06/2021 CLINICAL DATA:  NG tube placement. EXAM: ABDOMEN - 1 VIEW COMPARISON:  CT 02/12/2021 FINDINGS: Tip and side port of the enteric tube below the diaphragm in the stomach. No bowel dilatation in the upper abdomen. IMPRESSION: Tip and side port of the enteric tube below the diaphragm in the stomach. Electronically Signed   By: Keith Rake M.D.   On: 03/06/2021 00:01   DG CHEST PORT 1 VIEW  Result Date: 03/07/2021 CLINICAL DATA:  Acute hypoxemic respiratory failure. EXAM: PORTABLE CHEST 1 VIEW COMPARISON:  March 06, 2021. FINDINGS: Stable cardiomediastinal silhouette. Endotracheal and nasogastric tubes are unchanged in position. Right internal jugular catheter is unchanged. No pneumothorax or pleural effusion is noted. Stable  bilateral upper lobe opacities are noted concerning for pneumonia. Bony thorax is unremarkable. IMPRESSION: Stable support apparatus. Stable bilateral upper lobe opacities are noted concerning for pneumonia. Electronically Signed   By: Marijo Conception M.D.   On: 03/07/2021 08:27   DG CHEST PORT 1 VIEW  Result Date: 03/06/2021 CLINICAL DATA:  Central line placement. EXAM: PORTABLE CHEST 1 VIEW COMPARISON:  03/05/2021 FINDINGS: RIGHT IJ central line has been placed, tip overlying the level of the superior vena cava. Endotracheal tube is in place, tip 4.6 centimeters above the carina. Nasogastric tube is in place, tip beyond the image and beyond the level of the proximal stomach. Heart is mildly enlarged. Dense airspace filling opacities are identified throughout the UPPER lobes bilaterally, showing little change compared to prior studies. The lung bases are relatively spared. No pneumothorax following line placement. IMPRESSION: RIGHT IJ central line tip to the LOWER superior vena cava. No pneumothorax. Electronically Signed   By: Nolon Nations M.D.   On: 03/06/2021 10:52   DG CHEST PORT 1 VIEW  Result Date: 03/06/2021 CLINICAL DATA:  Intubation. EXAM: PORTABLE CHEST 1 VIEW COMPARISON:  Radiograph earlier today. FINDINGS: Endotracheal tube tip 4.6 cm from the carina. Enteric tube is in place, better assessed on subsequent abdominal radiograph. Stable cardiomegaly. Bilateral perihilar airspace disease with central air bronchograms, left greater than right, not significantly changed from earlier today. No convincing pleural effusion. No pneumothorax. IMPRESSION: 1. Endotracheal tube tip 4.6 cm from the carina. Enteric tube in place, better assessed on subsequent abdominal radiograph. 2. Stable cardiomegaly and bilateral perihilar airspace disease/edema. Electronically Signed   By: Keith Rake M.D.   On: 03/06/2021 00:01   DG CHEST PORT 1 VIEW  Result Date: 03/05/2021 CLINICAL DATA:  Shortness of breath.  EXAM: PORTABLE CHEST 1 VIEW COMPARISON:  March 04, 2021 FINDINGS: Predominant stable moderate to marked severity infiltrates are again seen, bilaterally. This is increased in severity when compared to the prior study. There is no evidence of a pleural effusion or pneumothorax. The cardiac silhouette is borderline in size. The visualized skeletal structures are unremarkable. IMPRESSION: Stable moderate to marked severity bilateral infiltrates. Electronically Signed   By: Virgina Norfolk M.D.   On: 03/05/2021 22:29   ECHOCARDIOGRAM COMPLETE  Result Date: 03/05/2021    ECHOCARDIOGRAM REPORT   Patient Name:   Bruce Hunter Date of Exam: 03/05/2021 Medical Rec #:  [id number]      Height:       70.0 in Accession #:    [id number]     Weight:  200.0 lb Date of Birth:  May 28, 1976      BSA:          2.087 m Patient Age:    70 years       BP:           98/61 mmHg Patient Gender: M              HR:           91 bpm. Exam Location:  Inpatient Procedure: 2D Echo, Cardiac Doppler and Color Doppler Indications:    I50.33 Acute on chronic diastolic (congestive) heart failure  History:        Patient has no prior history of Echocardiogram examinations.                 Risk Factors:Hypertension, Diabetes and Dyslipidemia.  Sonographer:    Tiffany Dance Referring Phys: [id number] [place]  1. Left ventricular ejection fraction, by estimation, is 60 to 65%. The left ventricle has normal function. The left ventricle has no regional wall motion abnormalities. Left ventricular diastolic parameters were normal.  2. Right ventricular systolic function is low normal. The right ventricular size is normal. There is mildly elevated pulmonary artery systolic pressure.  3. Right atrial size was mildly dilated.  4. The mitral valve is normal in structure. No evidence of mitral valve regurgitation.  5. Tricuspid valve regurgitation is mild to moderate.  6. The aortic valve is normal in structure. Aortic valve regurgitation is  not visualized. FINDINGS  Left Ventricle: Left ventricular ejection fraction, by estimation, is 60 to 65%. The left ventricle has normal function. The left ventricle has no regional wall motion abnormalities. The left ventricular internal cavity size was normal in size. There is  no left ventricular hypertrophy. Left ventricular diastolic parameters were normal. Right Ventricle: The right ventricular size is normal. Right vetricular wall thickness was not well visualized. Right ventricular systolic function is low normal. There is mildly elevated pulmonary artery systolic pressure. The tricuspid regurgitant velocity is 2.85 m/s, and with an assumed right atrial pressure of 8 mmHg, the estimated right ventricular systolic pressure is 33.3 mmHg. Left Atrium: Left atrial size was normal in size. Right Atrium: Right atrial size was mildly dilated. Pericardium: There is no evidence of pericardial effusion. Mitral Valve: The mitral valve is normal in structure. No evidence of mitral valve regurgitation. Tricuspid Valve: The tricuspid valve is normal in structure. Tricuspid valve regurgitation is mild to moderate. Aortic Valve: The aortic valve is normal in structure. Aortic valve regurgitation is not visualized. Pulmonic Valve: The pulmonic valve was not well visualized. Pulmonic valve regurgitation is not visualized. Aorta: The aortic root and ascending aorta are structurally normal, with no evidence of dilitation. IAS/Shunts: The atrial septum is grossly normal.  LEFT VENTRICLE PLAX 2D LVIDd:         4.40 cm  Diastology LVIDs:         2.80 cm  LV e' medial:    8.27 cm/s LV PW:         1.60 cm  LV E/e' medial:  6.5 LV IVS:        1.00 cm  LV e' lateral:   10.10 cm/s LVOT diam:     2.00 cm  LV E/e' lateral: 5.3 LV SV:         36 LV SV Index:   17 LVOT Area:     3.14 cm  RIGHT VENTRICLE  IVC RV Basal diam:  4.10 cm     IVC diam: 2.40 cm RV Mid diam:    3.40 cm RV S prime:     13.10 cm/s TAPSE (M-mode): 1.7 cm  LEFT ATRIUM           Index       RIGHT ATRIUM           Index LA diam:      3.60 cm 1.72 cm/m  RA Area:     20.20 cm LA Vol (A2C): 61.8 ml 29.61 ml/m RA Volume:   64.80 ml  31.04 ml/m LA Vol (A4C): 39.1 ml 18.73 ml/m  AORTIC VALVE LVOT Vmax:   70.10 cm/s LVOT Vmean:  45.000 cm/s LVOT VTI:    0.115 m  AORTA Ao Root diam: 3.60 cm Ao Asc diam:  3.20 cm MITRAL VALVE               TRICUSPID VALVE MV Area (PHT): 3.91 cm    TR Peak grad:   32.5 mmHg MV Decel Time: 194 msec    TR Vmax:        285.00 cm/s MV E velocity: 53.90 cm/s MV A velocity: 69.20 cm/s  SHUNTS MV E/A ratio:  0.78        Systemic VTI:  0.12 m                            Systemic Diam: 2.00 cm Mertie Moores MD Electronically signed by Mertie Moores MD Signature Date/Time: 03/05/2021/3:09:52 PM    Final      Assessment/Plan: AIDS ARF (? HIVAN) Esophageal candidiasis DM2 Anemia   Total days of antibiotics:  Cefepime 6/30--present Vancomycin 6/30--present Clindamycin 6/30--present Primaquine 6/30--present Fluconazole 7/1--present Methylprednisolone 6/30--present  Tivicay/lamivudine Central line, HD line placed 7-2 Intubated  7-1                                                  PJP DFA from trach aspirate still pending On HD Watch LFTs on clinda/primaquine. Appreciate pharm f/u.  No change in his rx at this point. Transfused today for hgb 6.3 He remains very ill.          Bobby Rumpf MD, FACP Infectious Diseases (pager) [phone] [email or web address] 03/07/2021, 11:17 AM  LOS: 3 days

## 2021-03-07 NOTE — Progress Notes (Signed)
 NAME:  Bruce Hunter, MRN:  [id number], DOB:  11/18/1975, LOS: 3 ADMISSION DATE:  03/04/2021, CONSULTATION DATE:  03/04/21 REFERRING MD:  Butler, CHIEF COMPLAINT:  Shortness of breath.     Brief Narrative:  44 yo man with HIV/AIDS here with acute hypoxemic respiratory failure.  Complaints of SOB and cough productive of tan sputum.  Has had respiratory symptoms since he was discharged earlier this month (6/13).     Recently diagnosed with HIV/AIDS, started on medications, but patient tells me he "hasn't had time to start taking them" but he isn't really able to explain further.  Patient was initiated on lamivudine, Tivicay, dapsone and fluconazole on discharge.  Patient does have an appointment with infectious disease as outpatient.  Candida esophagitis. At the time.    ID note 6/17:  DTG plus lamivudine. He can get the combined pill, dovato, upon discharge. 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 100mg daily  Pertinent  Medical History  Asthma DM2 Gout HTN Obesity  GERD AKI Neuropathy  Significant Hospital Events:  6/30 admitted for PNA with known HIV/AIDS 7/1 ET >>> Echo 7/1 mild  RA dilation with RV low nl fxn mild PAS elevation and nl LV 7/2 HD catheter >>>    Scheduled Meds:  sodium chloride   Intravenous Once   chlorhexidine gluconate (MEDLINE KIT)  15 mL Mouth Rinse BID   Chlorhexidine Gluconate Cloth  6 each Topical Daily   dolutegravir  50 mg Per Tube Daily   heparin  5,000 Units Subcutaneous Q8H   insulin aspart  0-15 Units Subcutaneous Q4H   ipratropium-albuterol  3 mL Nebulization Q4H   lamiVUDine  150 mg Per Tube Daily   mouth rinse  15 mL Mouth Rinse 10 times per day   methylPREDNISolone (SOLU-MEDROL) injection  40 mg Intravenous Q24H   pantoprazole (PROTONIX) IV  40 mg Intravenous QHS   primaquine  30 mg Oral  Q24H   sodium chloride flush  10-40 mL Intracatheter Q12H   Continuous Infusions:  sodium chloride Stopped (03/07/21 0603)   sodium chloride 5 mL/hr at 03/07/21 0800   ceFEPime (MAXIPIME) IV Stopped (03/07/21 0041)   clindamycin (CLEOCIN) IV Stopped (03/07/21 0633)   fentaNYL infusion INTRAVENOUS 400 mcg/hr (03/07/21 0800)   fluconazole (DIFLUCAN) IV Stopped (03/07/21 0008)   midazolam 6 mg/hr (03/07/21 0800)   norepinephrine (LEVOPHED) Adult infusion 17 mcg/min (03/07/21 0800)   phenylephrine (NEO-SYNEPHRINE) Adult infusion Stopped (03/06/21 1427)   prismasol BGK 2/2.5 replacement solution     prismasol BGK 2/2.5 replacement solution     prismasol BGK 4/2.5 1,800 mL/hr at 03/07/21 0216   vasopressin 0.03 Units/min (03/07/21 0800)   PRN Meds:.acetaminophen, albuterol, alteplase, docusate sodium, fentaNYL, guaiFENesin, heparin, midazolam, ondansetron (ZOFRAN) IV, polyethylene glycol, sodium chloride, sodium chloride flush     Interim History / Subjective:   Synchronous with vent on versed 6 , fentanyl 400 awake  Still requiring vasopressin/ levophed   Objective   Blood pressure 124/79, pulse 100, temperature 98.3 F (36.8 C), temperature source Oral, resp. rate (!) 29, height 5' 11" (1.803 m), weight 95 kg, SpO2 91 %. CVP:  [1 mmHg-13 mmHg] 1 mmHg  Vent Mode: PRVC FiO2 (%):  [50 %-70 %] 50 % Set Rate:  [29 bmp] 29 bmp Vt Set:  [500 mL] 500 mL PEEP:  [10 cmH20] 10 cmH20 Plateau Pressure:  [  24 cmH20-29 cmH20] 25 cmH20   Intake/Output Summary (Last 24 hours) at 03/07/2021 0835 Last data filed at 03/07/2021 0800 Gross per 24 hour  Intake 3825.72 ml  Output 1618.37 ml  Net 2207.35 ml   Filed Weights   03/04/21 1807 03/06/21 0500 03/07/21 0447  Weight: 90.7 kg 95.2 kg 95 kg   CVP:  [1 mmHg-13 mmHg] 1 mmHg    Examination: Tmax  99 General appearance:    obese bm on vent/ opens eyes and squeezes hands on command    No jvd Oropharynx et/ og  Neck supple Lungs with a few  scattered exp > insp rhonchi bilaterally RRR no s3 or or sign murmur Abd obese with limited  excursion/ minimal drainage  Extr warm with no edema or clubbing noted Neuro  Sensorium moving all 4,  no apparent motor deficits      I personally reviewed images and agree with radiology impression as follows:  CXR:   portable 7/3 Stable support apparatus. Stable bilateral upper lobe opacities are noted concerning for pneumonia.    Resolved Hospital Problem list     Assessment & Plan:   Acute respiratory failure/ vent dependent - Air trapping on vent am 7/3 may be affecting cvp readings 9(falsely elevating) and lowering his CO/bp >>> bronchodilators, adjust RR down to 24  and increased VT to 500 as this is not ARDS   PNA, immunocompromised in the setting of HIV/AIDS -Concern for PJP pneumonia  P: Head of bed elevated 30 degrees Ensure adequate pulmonary hygiene  Follow culture/antibiotics per ID     Sepsis with PCT elevation - pressor dependent with relatively low volume by cvp  P: Recheck cvp = 13 am 7/3 so no more volume needed  Continue stress dose steroids  Follow pan cultures and ABX per ID     AIDS/HIV Low CD 4, HIV viral load >1.47 mil P: ID following / rx per ID     AKI on CKD - outpt creat in  2-3 range on acei chronically  - CVVH attempted starting 7/2  Lab Results  Component Value Date   CREATININE 3.99 (H) 03/07/2021   CREATININE 4.76 (H) 03/06/2021   CREATININE 5.39 (H) 03/06/2021    Hyperkalemia - K up to 5.7 7/3 pre CVVH  P: Per renal/  plan for continue CVVH today   LE edema:  Hypoalbuminemia  - urine protein neg so this is not nephrotic syndrome  - RA pressures slt up on echo 7/1   Elevated LFTs  -Elevated AP, AST 70, ALT 32,  P: Trend LFTs Avoid hepatotoxins   Hx of HTN  Home medications include Norvasc, HCTZ, Lisinopril,  P: Holding  home meds  Continuous telemetry   Type 2 diabetes Neuropathic pain: in feet P: SSI increased  to moderate ssi 7/2  > better 7/3  CBG checks q4    Best Practice    Diet/type: tf per nutrition  DVT prophylaxis: prophylactic heparin  GI prophylaxis: PPI Lines:  HD catheter Foley:  N/A Code Status:  full code Last date of multidisciplinary goals of care discussion  Updated gf at bedside   The patient is critically ill with multiple organ systems failure and requires high complexity decision making for assessment and support, frequent evaluation and titration of therapies, application of advanced monitoring technologies and extensive interpretation of multiple databases. Critical Care Time devoted to patient care services described in this note is 40 minutes.    , MD Pulmonary and Critical Care Medicine   [facility] Cell [phone]   After 7:00 pm call Elink  [phone]            

## 2021-03-07 NOTE — Progress Notes (Signed)
eLink Physician-Brief Progress Note Patient Name: Bruce Hunter DOB: Mar 11, 1976 MRN: [id number]   Date of Service  03/07/2021  HPI/Events of Note  Anemia - Hgb = 6.3.  eICU Interventions  Will transfuse 1 unit PRBC now.      Intervention Category Major Interventions: Other:  [name] 03/07/2021, 5:56 AM

## 2021-03-08 DIAGNOSIS — B59 Pneumocystosis: Secondary | ICD-10-CM | POA: Diagnosis not present

## 2021-03-08 DIAGNOSIS — A419 Sepsis, unspecified organism: Secondary | ICD-10-CM

## 2021-03-08 DIAGNOSIS — B20 Human immunodeficiency virus [HIV] disease: Secondary | ICD-10-CM | POA: Diagnosis not present

## 2021-03-08 DIAGNOSIS — J9601 Acute respiratory failure with hypoxia: Secondary | ICD-10-CM | POA: Diagnosis not present

## 2021-03-08 DIAGNOSIS — N179 Acute kidney failure, unspecified: Secondary | ICD-10-CM | POA: Diagnosis not present

## 2021-03-08 DIAGNOSIS — R6521 Severe sepsis with septic shock: Secondary | ICD-10-CM

## 2021-03-08 LAB — BLOOD GAS, ARTERIAL
Acid-base deficit: 1.4 mmol/L (ref 0.0–2.0)
Bicarbonate: 23.7 mmol/L (ref 20.0–28.0)
Drawn by: 25788
FIO2: 50
MECHVT: 550 mL
O2 Saturation: 93.2 %
Patient temperature: 98.6
RATE: 20 resp/min
pCO2 arterial: 45.7 mmHg (ref 32.0–48.0)
pH, Arterial: 7.336 — ABNORMAL LOW (ref 7.350–7.450)
pO2, Arterial: 83.5 mmHg (ref 83.0–108.0)

## 2021-03-08 LAB — RENAL FUNCTION PANEL
Albumin: 2.1 g/dL — ABNORMAL LOW (ref 3.5–5.0)
Albumin: 2.2 g/dL — ABNORMAL LOW (ref 3.5–5.0)
Anion gap: 8 (ref 5–15)
Anion gap: 7 (ref 5–15)
BUN: 51 mg/dL — ABNORMAL HIGH (ref 6–20)
BUN: 44 mg/dL — ABNORMAL HIGH (ref 6–20)
CO2: 24 mmol/L (ref 22–32)
CO2: 26 mmol/L (ref 22–32)
Calcium: 7.8 mg/dL — ABNORMAL LOW (ref 8.9–10.3)
Calcium: 7.9 mg/dL — ABNORMAL LOW (ref 8.9–10.3)
Chloride: 103 mmol/L (ref 98–111)
Chloride: 104 mmol/L (ref 98–111)
Creatinine, Ser: 2.51 mg/dL — ABNORMAL HIGH (ref 0.61–1.24)
Creatinine, Ser: 2.43 mg/dL — ABNORMAL HIGH (ref 0.61–1.24)
GFR, Estimated: 32 mL/min — ABNORMAL LOW (ref >60)
GFR, Estimated: 33 mL/min — ABNORMAL LOW (ref >60)
Glucose, Bld: 245 mg/dL — ABNORMAL HIGH (ref 70–99)
Glucose, Bld: 112 mg/dL — ABNORMAL HIGH (ref 70–99)
Phosphorus: 5.2 mg/dL — ABNORMAL HIGH (ref 2.5–4.6)
Phosphorus: 4 mg/dL (ref 2.5–4.6)
Potassium: 5 mmol/L (ref 3.5–5.1)
Potassium: 4.5 mmol/L (ref 3.5–5.1)
Sodium: 135 mmol/L (ref 135–145)
Sodium: 137 mmol/L (ref 135–145)

## 2021-03-08 LAB — MAGNESIUM
Magnesium: 2.4 mg/dL (ref 1.7–2.4)
Magnesium: 2.4 mg/dL (ref 1.7–2.4)

## 2021-03-08 LAB — CBC
HCT: 24.5 % — ABNORMAL LOW (ref 39.0–52.0)
Hemoglobin: 7.4 g/dL — ABNORMAL LOW (ref 13.0–17.0)
MCH: 29.8 pg (ref 26.0–34.0)
MCHC: 30.2 g/dL (ref 30.0–36.0)
MCV: 98.8 fL (ref 80.0–100.0)
Platelets: 166 10*3/uL (ref 150–400)
RBC: 2.48 MIL/uL — ABNORMAL LOW (ref 4.22–5.81)
RDW: 18.8 % — ABNORMAL HIGH (ref 11.5–15.5)
WBC: 11.2 10*3/uL — ABNORMAL HIGH (ref 4.0–10.5)
nRBC: 1 % — ABNORMAL HIGH (ref 0.0–0.2)

## 2021-03-08 LAB — GLUCOSE, CAPILLARY
Glucose-Capillary: 104 mg/dL — ABNORMAL HIGH (ref 70–99)
Glucose-Capillary: 112 mg/dL — ABNORMAL HIGH (ref 70–99)
Glucose-Capillary: 115 mg/dL — ABNORMAL HIGH (ref 70–99)
Glucose-Capillary: 141 mg/dL — ABNORMAL HIGH (ref 70–99)
Glucose-Capillary: 203 mg/dL — ABNORMAL HIGH (ref 70–99)
Glucose-Capillary: 237 mg/dL — ABNORMAL HIGH (ref 70–99)
Glucose-Capillary: 257 mg/dL — ABNORMAL HIGH (ref 70–99)

## 2021-03-08 LAB — PREPARE RBC (CROSSMATCH)

## 2021-03-08 MED ORDER — GUAIFENESIN 100 MG/5ML PO SOLN: 5.0000 mL | ORAL | Status: DC | PRN

## 2021-03-08 MED ORDER — PROSOURCE TF PO LIQD
90.0000 mL | Freq: Every day | ORAL | Status: DC
  Administered 2021-03-08 – 2021-03-10 (×10): 90 mL
  Filled 2021-03-08 (×11): qty 90

## 2021-03-08 MED ORDER — LIP MEDEX EX OINT
TOPICAL_OINTMENT | CUTANEOUS | Status: AC
  Filled 2021-03-08: qty 7

## 2021-03-08 MED ORDER — PIVOT 1.5 CAL PO LIQD
1000.0000 mL | ORAL | Status: DC
  Administered 2021-03-08 – 2021-03-09 (×2): 1000 mL
  Filled 2021-03-08 (×2): qty 1000

## 2021-03-08 MED ORDER — POLYETHYLENE GLYCOL 3350 17 G PO PACK: 17.0000 g | PACK | Freq: Every day | ORAL | Status: DC | PRN

## 2021-03-08 MED ORDER — ALTEPLASE 2 MG IJ SOLR: 2.0000 mg | Freq: Once | INTRAMUSCULAR | Status: DC

## 2021-03-08 MED ORDER — DOCUSATE SODIUM 50 MG/5ML PO LIQD
100.0000 mg | Freq: Two times a day (BID) | ORAL | Status: DC | PRN
  Administered 2021-03-08: 100 mg
  Filled 2021-03-08: qty 10

## 2021-03-08 MED ORDER — ACETAMINOPHEN 160 MG/5ML PO SOLN
650.0000 mg | Freq: Four times a day (QID) | ORAL | Status: DC | PRN
  Administered 2021-03-10 – 2021-03-20 (×8): 650 mg
  Filled 2021-03-08 (×8): qty 20.3

## 2021-03-08 NOTE — Procedures (Signed)
I saw and evaluated the patient on CRRT.  I have reviewed the session itself and made appropriate changes.  Prefilter and post filter rates were backwards overnight but corrected this morning.  Electrolytes are acceptable with current prescription.  Maintain even for now unless respiratory status worsens.  Will need fluid off eventually.  Patient continues to require pressors and urine output is minimal.  Continue to monitor for progress.  Filed Weights   03/06/21 0500 03/07/21 0447 03/08/21 0357  Weight: 95.2 kg 95 kg 95.7 kg    Recent Labs  Lab 03/08/21 0613  NA 135  K 5.0  CL 103  CO2 24  GLUCOSE 245*  BUN 51*  CREATININE 2.51*  CALCIUM 7.8*  PHOS 5.2*    Recent Labs  Lab 03/04/21 1814 03/05/21 0130 03/05/21 0415 03/06/21 1150 03/07/21 0452 03/07/21 2150 03/08/21 0613  WBC 25.2* 26.5*   < > 12.3* 12.8*  --  11.2*  NEUTROABS 21.2* 20.9*  --   --   --   --   --   HGB 8.0* 7.0*   < > 7.3* 6.3* 6.2* 7.4*  HCT 25.8* 23.0*   < > 25.4* 21.8* 20.9* 24.5*  MCV 94.2 96.2   < > 101.6* 102.3*  --  98.8  PLT 301 270   < > 231 206  --  166   < > = values in this interval not displayed.    Scheduled Meds:  alteplase  2 mg Intracatheter Once   alteplase  2 mg Intracatheter Once   chlorhexidine gluconate (MEDLINE KIT)  15 mL Mouth Rinse BID   Chlorhexidine Gluconate Cloth  6 each Topical Daily   dolutegravir  50 mg Per Tube Daily   feeding supplement (PROSource TF)  45 mL Per Tube BID   feeding supplement (VITAL HIGH PROTEIN)  1,000 mL Per Tube Q24H   gabapentin  300 mg Per Tube Q8H   heparin  5,000 Units Subcutaneous Q8H   insulin aspart  0-15 Units Subcutaneous Q4H   ipratropium-albuterol  3 mL Nebulization Q4H   lamiVUDine  150 mg Per Tube Daily   mouth rinse  15 mL Mouth Rinse 10 times per day   methylPREDNISolone (SOLU-MEDROL) injection  40 mg Intravenous Q24H   pantoprazole sodium  40 mg Per Tube QHS   primaquine  30 mg Per Tube Q24H   sodium chloride flush  10-40 mL  Intracatheter Q12H   Continuous Infusions:  sodium chloride 10 mL/hr at 03/08/21 1000   sodium chloride Stopped (03/07/21 1105)   ceFEPime (MAXIPIME) IV Stopped (03/08/21 0120)   clindamycin (CLEOCIN) IV Stopped (03/08/21 0603)   fentaNYL infusion INTRAVENOUS 400 mcg/hr (03/08/21 1000)   fluconazole (DIFLUCAN) IV Stopped (03/08/21 0015)   midazolam 6 mg/hr (03/08/21 1000)   norepinephrine (LEVOPHED) Adult infusion 2 mcg/min (03/08/21 1000)   prismasol BGK 2/2.5 replacement solution 400 mL/hr at 03/08/21 0458   prismasol BGK 2/2.5 replacement solution 200 mL/hr at 03/08/21 0458   prismasol BGK 4/2.5 1,800 mL/hr at 03/08/21 0846   PRN Meds:.acetaminophen, albuterol, docusate, fentaNYL, guaiFENesin, heparin, midazolam, ondansetron (ZOFRAN) IV, polyethylene glycol, sodium chloride, sodium chloride flush   Santiago Bumpers,  MD 03/08/2021, 11:30 AM

## 2021-03-08 NOTE — Progress Notes (Addendum)
Blue port clotted - tpa given - pt currently at -.03

## 2021-03-08 NOTE — Progress Notes (Signed)
Notified MD Wert in regards to patient's left arm more swollen/edematous than right arm. IV infusing in arm flushed with ease and had blood return as well. MD Wert ordered to have vascular ultrasound of left upper extremity completed. Removed IVs from left arm and placed 18G IV in right arm.

## 2021-03-08 NOTE — Progress Notes (Addendum)
Patients had more complications with CRRT last night  CRRT stopped 7/3 '@2335'$  Blue port - tpa 0023 Rechecked tpa @ 0230 unsuccessful aspiration & tpa removal Rechecked tpa @ 0345 successful aspiration & tpa removal Blue port Hep 1.2 @ 0415 CRRT restarted 7/4 '@458'$  once primed. Nephrology informed.  Position of the head changed but IV team recommends HD line be replaced.

## 2021-03-08 NOTE — Progress Notes (Addendum)
NAME:  Bruce Hunter, MRN:  [id number], DOB:  1976/02/27, LOS: 4 ADMISSION DATE:  03/04/2021, CONSULTATION DATE:  03/04/21 REFERRING MD:  Melina Copa, CHIEF COMPLAINT:  Shortness of breath.     Brief Narrative:  45 yo man with HIV/AIDS here with acute hypoxemic respiratory failure.  Complaints of SOB and cough productive of tan sputum.  Has had respiratory symptoms since he was discharged earlier this month (6/13).     Recently diagnosed with HIV/AIDS, started on medications, but patient tells me he "hasn't had time to start taking them" but he isn't really able to explain further.  Patient was initiated on lamivudine, Tivicay, dapsone and fluconazole on discharge.  Patient does have an appointment with infectious disease as outpatient.  Candida esophagitis. At the time.    ID note 6/17:  DTG plus lamivudine. He can get the combined pill, dovato, upon discharge. 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 $RemoveBe'100mg'WxossiBYg$  daily  Pertinent  Medical History  Asthma DM2 Gout HTN Obesity  GERD AKI Neuropathy  Significant Hospital Events:  6/30 admitted for PNA with known HIV/AIDS 7/1 ET >>> Echo 7/1 mild  RA dilation with RV low nl fxn mild PAS elevation and nl LV 7/2 HD catheter >>>    Scheduled Meds:  alteplase  2 mg Intracatheter Once   alteplase  2 mg Intracatheter Once   chlorhexidine gluconate (MEDLINE KIT)  15 mL Mouth Rinse BID   Chlorhexidine Gluconate Cloth  6 each Topical Daily   dolutegravir  50 mg Per Tube Daily   feeding supplement (PROSource TF)  45 mL Per Tube BID   feeding supplement (VITAL HIGH PROTEIN)  1,000 mL Per Tube Q24H   gabapentin  300 mg Per Tube Q8H   heparin  5,000 Units Subcutaneous Q8H   insulin aspart  0-15 Units Subcutaneous Q4H   ipratropium-albuterol  3 mL Nebulization Q4H   lamiVUDine  150 mg Per Tube Daily    mouth rinse  15 mL Mouth Rinse 10 times per day   methylPREDNISolone (SOLU-MEDROL) injection  40 mg Intravenous Q24H   pantoprazole sodium  40 mg Per Tube QHS   primaquine  30 mg Per Tube Q24H   sodium chloride flush  10-40 mL Intracatheter Q12H   Continuous Infusions:  sodium chloride 10 mL/hr at 03/08/21 0800   sodium chloride Stopped (03/07/21 1105)   ceFEPime (MAXIPIME) IV Stopped (03/08/21 0120)   clindamycin (CLEOCIN) IV Stopped (03/08/21 0603)   fentaNYL infusion INTRAVENOUS 400 mcg/hr (03/08/21 0800)   fluconazole (DIFLUCAN) IV Stopped (03/08/21 0015)   midazolam 6 mg/hr (03/08/21 0800)   norepinephrine (LEVOPHED) Adult infusion 2 mcg/min (03/08/21 0800)   prismasol BGK 2/2.5 replacement solution 400 mL/hr at 03/08/21 0458   prismasol BGK 2/2.5 replacement solution 200 mL/hr at 03/08/21 0458   prismasol BGK 4/2.5 1,800 mL/hr at 03/08/21 0555   vasopressin 0.03 Units/min (03/08/21 0800)   PRN Meds:.acetaminophen, albuterol, docusate sodium, fentaNYL, guaiFENesin, heparin, midazolam, ondansetron (ZOFRAN) IV, polyethylene glycol, sodium chloride, sodium chloride flush     Interim History / Subjective:   Continue to have trouble with CVVH   Objective   Blood pressure 112/73, pulse 96, temperature 98.3 F (36.8 C), temperature source Oral, resp. rate (!) 24, height $RemoveBe'5\' 11"'nJEvuqVZq$  (1.803 m), weight 95.7 kg, SpO2 97 %. CVP:  [6 mmHg-13 mmHg] 13 mmHg  Vent Mode: PRVC FiO2 (%):  [  50 %-60 %] 50 % Set Rate:  [24 bmp] 24 bmp Vt Set:  [550 mL] 550 mL PEEP:  [8 cmH20] 8 cmH20 Plateau Pressure:  [22 cmH20-25 cmH20] 25 cmH20   Intake/Output Summary (Last 24 hours) at 03/08/2021 0836 Last data filed at 03/08/2021 0800 Gross per 24 hour  Intake 4080.65 ml  Output 2585.5 ml  Net 1495.15 ml   Filed Weights   03/06/21 0500 03/07/21 0447 03/08/21 0357  Weight: 95.2 kg 95 kg 95.7 kg   CVP:  [6 mmHg-13 mmHg] 13 mmHg    Examination: Tmax  98.6  General appearance:    middle aged bm/ sdedate  on     No jvd Oropharynx  et/ og  Neck supple Lungs with a few scattered exp > insp rhonchi bilaterally RRR no s3 or or sign murmur Abd  hypoactive bowel sounds ok  excursion / tol tf ok  Extr warm with 1+ pitting edema or clubbing noted/prevlon boots and pas Neuro  Sensorium f/c despite sedation,  no apparent motor deficits     Resolved Hospital Problem list     Assessment & Plan:   Acute respiratory failure/ vent dependent - Air trapping on vent  is less today but still present  >>> bronchodilators, adjust RR down to 20 and leave VT   550 as this is not ARDS   PNA, immunocompromised in the setting of HIV/AIDS -Concern for PJP pneumonia  P: Head of bed elevated 30 degrees Ensure adequate pulmonary hygiene  Follow culture/antibiotics per ID     Sepsis with PCT elevation - pressor dependency improving  P: Rechecked cvp = 10 am 7/4 with peep 8 plus ? How much autopeep so still relatively vol depleted  Continue stress dose steroids  Follow pan cultures and ABX per ID  Eliminate autopeep if possible with lower RR to 20 am 7/4     AIDS/HIV Low CD 4, HIV viral load >1.47 mil P: ID following / rx per ID     AKI on CKD - outpt creat in  2-3 range on acei chronically  - CVVH started 7/2 but not consistently working  Lab Results  Component Value Date   CREATININE 2.51 (H) 03/08/2021   CREATININE 3.05 (H) 03/07/2021   CREATININE 3.99 (H) 03/07/2021   >>> rx per renal   Hyperkalemia, resolved with cvvh - K up to 5.7 7/3 pre CVVH  P: Per renal/  plan for continue CVVH today   LE edema, improved Hypoalbuminemia  - urine protein neg so this is not nephrotic syndrome  - RA pressures slt up on echo 7/1   Elevated LFTs  -Elevated AP, AST 70, ALT 32,  P: Trend LFTs Avoid hepatotoxins   Hx of HTN  Home medications include Norvasc, HCTZ, Lisinopril,  P: Holding  home meds  Continuous telemetry   Type 2 diabetes Neuropathic pain: in feet P: SSI increased to  moderate ssi 7/2  > better 7/3  CBG checks q4  Anemia ? Cause  Lab Results  Component Value Date   HGB 7.4 (L) 03/08/2021   HGB 6.2 (LL) 03/07/2021   HGB 6.3 (LL) 03/07/2021   HGB 8.9 (L) 02/13/2021   Approp response to tx x one unit given on 7/3     Best Practice    Diet/type: tf per nutrition  DVT prophylaxis: prophylactic heparin  GI prophylaxis: PPI Lines:  HD catheter Foley:  N/A Code Status:  full code Last date of multidisciplinary goals of care discussion  Updated gf at bedside    The patient is critically ill with multiple organ systems failure and requires high complexity decision making for assessment and support, frequent evaluation and titration of therapies, application of advanced monitoring technologies and extensive interpretation of multiple databases. Critical Care Time devoted to patient care services described in this note is 35 minutes.   Christinia Gully, MD Pulmonary and [place] [phone]   After 7:00 pm call Elink  [phone]

## 2021-03-08 NOTE — Progress Notes (Signed)
Pt with right [place] placed 7/2 that is positional and has clotted off twice. If [facility] continues with these issues, may want to consider replacing line for CRRT.

## 2021-03-08 NOTE — Progress Notes (Signed)
Initial Nutrition Assessment  DOCUMENTATION CODES:   Not applicable  INTERVENTION:  - will adjust TF regimen: Pivot 1.5 @ 30 ml/hr to advance by 10 ml every 8 hours to reach goal rate of 50 ml/hr with 90 ml Prosource TF x5/day.  - at goal rate, this regimen will provide 2200 kcal (104% kcal need), 222 grams protein, and 900 ml free water.  - free water flush, if desired, to be per CCM and Nephrology.    NUTRITION DIAGNOSIS:   Inadequate oral intake related to inability to eat as evidenced by NPO status.   GOAL:   Patient will meet greater than or equal to 90% of their needs  MONITOR:   Vent status, TF tolerance, Labs, Weight trends, Skin  REASON FOR ASSESSMENT:   Ventilator, Consult Enteral/tube feeding initiation and management  ASSESSMENT:   45 year-old male with medical history of recently diagnosed HIV/AIDS and non-compliant with HAART, DM, asthma, HLD, HTN, gout, and obesity. He presented to the ED d/t shortness of breath, peripheral edema, and productive (tan sputum) cough.  Significant Events: 6/30- admission 7/1- intubated; OGT placed 7/2- HD cath placed in R IJ; CRRT initiation 7/3- TF initiation per protocol    No family or visitors present at bedside. Able to talk with RN who reports improvement overall and that patient is current tolerating TF regimen without issue.  He remains intubated at this time and has OGT in place (gastric per abdominal x-ray on 7/1). He is receiving TF regimen per protocol: Vital High Protein @ 40 ml/hr with 45 ml Prosource TF BID. This regimen is providing 1040 kcal, 106 grams, and 802 ml free water.   Weight today is 211 lb, weight yesterday was 209 lb, and weight on 7/2 was 210 lb. Weight on 02/19/21 was 197 lb, weight on 02/15/21 was 204 lb, and weight on 02/26/20 was 234 lb. Continue to monitor closely.   Per notes: - PNA - newly dx HIV/AIDS who was non-compliant with medication since d/c in June - sepsis - AKI on  CKD   Patient is currently intubated on ventilator support MV: 12.6 L/min Temp (24hrs), Avg:98.2 F (36.8 C), Min:98 F (36.7 C), Max:98.3 F (36.8 C) Propofol: none BP: 98/56 and MAP: 69  Labs reviewed; CBGs: 237 and 257 mg/dl, BUN: 51 mg/dl, creatinine: 2.51 mg/dl, Ca: 7.8 mg/dl, Phos: 5.2 mg/dl, GFR: 32 ml/min.  Medications reviewed; sliding scale novolog, 40 mg solu-medrol/day, 40 mg protonix/day. Drips; levo @ 2 mcg/min, versed @ 6 mg/hr, fentanyl @ 400 mcg/hr.    NUTRITION - FOCUSED PHYSICAL EXAM:  Completed; no muscle or fat depletions, mild pitting edema to RUE and BLE, deep pitting edema to LUE.   Diet Order:   Diet Order             Diet NPO time specified  Diet effective now                   EDUCATION NEEDS:   No education needs have been identified at this time  Skin:  Skin Assessment: Skin Integrity Issues: Skin Integrity Issues:: Stage II, Other (Comment) Stage II: bilateral buttocks Other: MASD coccyx  Last BM:  7/1  Height:   Ht Readings from Last 1 Encounters:  03/05/21 '5\' 11"'$  (1.803 m)    Weight:   Wt Readings from Last 1 Encounters:  03/08/21 95.7 kg      Estimated Nutritional Needs:  Kcal:  2121 kcal Protein:  190-240 grams Fluid:  >/= 2 L/day  Loyola Santino, MS, RD, LDN, CNSC Inpatient Clinical Dietitian RD pager # available in [facility]  After hours/weekend pager # available in [facility]  

## 2021-03-08 NOTE — Progress Notes (Signed)
Nephrology Follow-Up Consult note   Assessment/Recommendations: Bruce Hunter is a/an 45 y.o. male with a past medical history significant for HIV/AIDS, CKD, asthma, obesity, DM2 admitted for acute hypoxic respiratory failure with sepsis.      Oliguric AKI on CKD3b: Likely secondary to ATN secondary to severe sepsis.  CKD with baseline of 2.5-3.  Urine output remains present low.  Overall clinically improving today -Continue CRRT with current prescription -Continue to monitor daily Cr, Dose meds for GFR -Monitor Daily I/Os, Daily weight  -Maintain MAP>65 for optimal renal perfusion.  -Avoid nephrotoxic medications including NSAIDs and Vanc/Zosyn combo -Continue treating sepsis as below  Acute hypoxic respiratory failure: Remains ventilated.  FiO2 turned down today.  Management per primary  Septic shock: Yesterday required vasopressin and norepi.  Norepinephrine dose is diminishing and vasopressin off.  Continue management per primary  Pneumonia: Concern for PJP.  Antibiotics and management per primary  HIV: ID managing  Hypertension: History of such.  Holding home blood pressure medications  DM2: Insulin management per primary  Anemia: Likely multifactorial with infection contributing.  Transfuse for hemoglobin less than 7 as needed  Recommendations conveyed to primary service.    [facility] 03/08/2021 11:24 AM  ___________________________________________________________  CC: Acute renal failure  Interval History/Subjective: The patient remains intubated and sedated.  Comfortable.  Norepinephrine being weaned today.  Vasopressin off.  Ventilator requirements diminishing.  He continues to make little but some urine.   Medications:  Current Facility-Administered Medications  Medication Dose Route Frequency Provider Last Rate Last Admin   0.9 %  sodium chloride infusion  250 mL Intravenous Continuous Candee Furbish, MD 10 mL/hr at 03/08/21  1000 Infusion Verify at 03/08/21 1000   0.9 %  sodium chloride infusion  250 mL Intravenous Continuous Anders Simmonds, MD   Stopped at 03/07/21 1105   acetaminophen (TYLENOL) 160 MG/5ML solution 650 mg  650 mg Per Tube Q6H PRN Tanda Rockers, MD       albuterol (PROVENTIL) (2.5 MG/3ML) 0.083% nebulizer solution 2.5 mg  2.5 mg Nebulization Q2H PRN Collier Bullock, MD       alteplase (CATHFLO ACTIVASE) injection 2 mg  2 mg Intracatheter Once Tanda Rockers, MD       alteplase (CATHFLO ACTIVASE) injection 2 mg  2 mg Intracatheter Once Roney Jaffe, MD       ceFEPIme (MAXIPIME) 2 g in sodium chloride 0.9 % 100 mL IVPB  2 g Intravenous Q12H Eudelia Bunch, RPH   Stopped at 03/08/21 0120   chlorhexidine gluconate (MEDLINE KIT) (PERIDEX) 0.12 % solution 15 mL  15 mL Mouth Rinse BID Candee Furbish, MD   15 mL at 03/08/21 3299   Chlorhexidine Gluconate Cloth 2 % PADS 6 each  6 each Topical Daily Candee Furbish, MD   6 each at 03/08/21 2426   clindamycin (CLEOCIN) IVPB 900 mg  900 mg Intravenous Everlene Other, MD   Stopped at 03/08/21 0603   docusate (COLACE) 50 MG/5ML liquid 100 mg  100 mg Per Tube BID PRN Tanda Rockers, MD       dolutegravir (TIVICAY) tablet 50 mg  50 mg Per Tube Daily Tanda Rockers, MD   50 mg at 03/08/21 1016   feeding supplement (PROSource TF) liquid 45 mL  45 mL Per Tube BID Tanda Rockers, MD   45 mL at 03/08/21 1017   feeding supplement (VITAL HIGH PROTEIN) liquid 1,000 mL  1,000 mL  Per Tube Q24H Tanda Rockers, MD   1,000 mL at 03/08/21 1017   fentaNYL (SUBLIMAZE) bolus via infusion 30 mcg  30 mcg Intravenous Q1H PRN Anders Simmonds, MD   30 mcg at 03/08/21 0421   fentaNYL 2555mg in NS 2526m(104mml) infusion-PREMIX  0-400 mcg/hr Intravenous Continuous IzqTyna JakschD 40 mL/hr at 03/08/21 1000 400 mcg/hr at 03/08/21 1000   fluconazole (DIFLUCAN) IVPB 400 mg  400 mg Intravenous Q24H BelEudelia BunchPH   Stopped at 03/08/21 0015   gabapentin  (NEURONTIN) 250 MG/5ML solution 300 mg  300 mg Per Tube Q8H WerTanda RockersD   300 mg at 03/08/21 0533   guaiFENesin (ROBITUSSIN) 100 MG/5ML solution 100 mg  5 mL Per Tube Q4H PRN WerTanda RockersD       heparin injection 1,000-6,000 Units  1,000-6,000 Units CRRT PRN SchRoney JaffeD   1,200 Units at 03/08/21 0350   heparin injection 5,000 Units  5,000 Units Subcutaneous Q8H GonCollier BullockD   5,000 Units at 03/08/21 0533   insulin aspart (novoLOG) injection 0-15 Units  0-15 Units Subcutaneous Q4H Ollis, Brandi L, NP   8 Units at 03/08/21 0853   ipratropium-albuterol (DUONEB) 0.5-2.5 (3) MG/3ML nebulizer solution 3 mL  3 mL Nebulization Q4H IzqTyna JakschD   3 mL at 03/08/21 1048   lamiVUDine (EPIVIR) tablet 150 mg  150 mg Per Tube Daily WerTanda RockersD   150 mg at 03/08/21 1015   MEDLINE mouth rinse  15 mL Mouth Rinse 10 times per day SmiCandee FurbishD   15 mL at 03/08/21 1039   methylPREDNISolone sodium succinate (SOLU-MEDROL) 40 mg/mL injection 40 mg  40 mg Intravenous Q24H GonCollier BullockD   40 mg at 03/07/21 2127   midazolam (VERSED) 50 mg/50 mL (1 mg/mL) premix infusion  1-10 mg/hr Intravenous Continuous WerTanda RockersD 6 mL/hr at 03/08/21 1000 6 mg/hr at 03/08/21 1000   midazolam (VERSED) injection 1-2 mg  1-2 mg Intravenous Q2H PRN SomAnders SimmondsD   2 mg at 03/08/21 0424   norepinephrine (LEVOPHED) 16 mg in 250m47memix infusion  0-40 mcg/min Intravenous Titrated WertTanda Rockers 1.88 mL/hr at 03/08/21 1000 2 mcg/min at 03/08/21 1000   ondansetron (ZOFRAN) injection 4 mg  4 mg Intravenous Q6H PRN GonzCollier Bullock       pantoprazole sodium (PROTONIX) 40 mg/20 mL oral suspension 40 mg  40 mg Per Tube QHS BellLeodis SiasRPH   40 mg at 03/07/21 2125   polyethylene glycol (MIRALAX / GLYCOLAX) packet 17 g  17 g Per Tube Daily PRN WertTanda Rockers       primaquine tablet 30 mg  30 mg Per Tube Q24H BellLeodis SiasRPH   30 mg at 03/07/21 2130    prismasol BGK 2/2.5 replacement solution   CRRT Continuous ScheRoney Jaffe 400 mL/hr at 03/08/21 0458 Restarted at 03/08/21 0458   prismasol BGK 2/2.5 replacement solution   CRRT Continuous ScheRoney Jaffe 200 mL/hr at 03/08/21 0458 Restarted at 03/08/21 0458   prismasol BGK 4/2.5 infusion   CRRT Continuous ScheRoney Jaffe 1,800 mL/hr at 03/08/21 0846 New Bag at 03/08/21 0846   sodium chloride 0.9 % primer fluid for CRRT   CRRT PRN ScheRoney Jaffe       sodium chloride flush (NS) 0.9 % injection 10-40 mL  10-40 mL Intracatheter Q12H WertTanda Rockers  10 mL at 03/08/21 1040   sodium chloride flush (NS) 0.9 % injection 10-40 mL  10-40 mL Intracatheter PRN Tanda Rockers, MD          Review of Systems: Unable to obtain due to the patient's sedation  Physical Exam: Vitals:   03/08/21 1035 03/08/21 1045  BP: (!) 91/57 132/84  Pulse: 100 98  Resp: (!) 25 17  Temp:    SpO2: 91% (!) 89%   Total I/O In: 304.1 [I.V.:184.1; NG/GT:120] Out: 323.2 [Other:323.2]  Intake/Output Summary (Last 24 hours) at 03/08/2021 1124 Last data filed at 03/08/2021 1000 Gross per 24 hour  Intake 3671.62 ml  Output 2605.1 ml  Net 1066.52 ml   Constitutional: Ill-appearing, intubated and sedated ENMT: ears and nose without scars or lesions, MMM CV: Tachycardic, edema present in all 4 extremities Respiratory: Coarse bilateral breath sounds, ventilated, bilateral chest rise Gastrointestinal: soft, non-tender, no palpable masses or hernias Skin: no visible lesions or rashes Psych: Sedated, does not respond to questioning   Test Results I personally reviewed new and old clinical labs and radiology tests Lab Results  Component Value Date   NA 135 03/08/2021   K 5.0 03/08/2021   CL 103 03/08/2021   CO2 24 03/08/2021   BUN 51 (H) 03/08/2021   CREATININE 2.51 (H) 03/08/2021   CALCIUM 7.8 (L) 03/08/2021   ALBUMIN 2.1 (L) 03/08/2021   PHOS 5.2 (H) 03/08/2021

## 2021-03-08 NOTE — TOC Initial Note (Addendum)
Transition of Care [facility]) - Initial/Assessment Note    Patient Details  Name: Bruce Hunter MRN: [id number] Date of Birth: May 04, 1976  [facility]) CM/SW Contact:    Leeroy Cha, RN Phone Number: 03/08/2021, 9:25 AM  Clinical Narrative:                 45 yo man with HIV/AIDS here with acute hypoxemic respiratory failure.  Complaints of SOB and cough productive of tan sputum.  Has had respiratory symptoms since he was discharged earlier this month (6/13).     Recently diagnosed with HIV/AIDS, started on medications, but patient tells me he "hasn't had time to start taking them" but he isn't really able to explain further.  Patient was initiated on lamivudine, Tivicay, dapsone and fluconazole on discharge.  Patient does have an appointment with infectious disease as outpatient.  Candida esophagitis. At the time.     ID note 6/17: DTG plus lamivudine. He can get the combined pill, dovato, upon discharge. 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  Lives alone has family in the area. [id number] vent at 50% iv solu medrol, iv abxx3, iv sedation, CRRT. Resp. Culture +,BUN-51 creat'.-2.51 on CRRT, hgb 7.4. Expected Discharge Plan: Home/Self Care Barriers to Discharge: Continued Medical Work up   Patient Goals and CMS Choice Patient states their goals for this hospitalization and ongoing recovery are:: to go home CMS Medicare.gov Compare Post Acute Care list provided to:: Patient    Expected Discharge Plan and Services Expected Discharge Plan: Home/Self Care   Discharge Planning Services: CM Consult   Living arrangements for the past 2 months: Single Family Home                                      Prior Living  Arrangements/Services Living arrangements for the past 2 months: Single Family Home Lives with:: Self Patient language and need for interpreter reviewed:: Yes Do you feel safe going back to the place where you live?: Yes            Criminal Activity/Legal Involvement Pertinent to Current Situation/Hospitalization: No - Comment as needed  Activities of Daily Living Home Assistive Devices/Equipment: Cane (specify quad or straight) (sometimes at home) ADL Screening (condition at time of admission) Patient's cognitive ability adequate to safely complete daily activities?: Yes Is the patient deaf or have difficulty hearing?: No Does the patient have difficulty seeing, even when wearing glasses/contacts?: No Does the patient have difficulty concentrating, remembering, or making decisions?: No Patient able to express need for assistance with ADLs?: Yes Does the patient have difficulty dressing or bathing?: No Independently performs ADLs?: Yes (appropriate for developmental age) Does the patient have difficulty walking or climbing stairs?: No Weakness of Legs: None Weakness of Arms/Hands: None  Permission Sought/Granted                  Emotional Assessment Appearance:: Appears stated age Attitude/Demeanor/Rapport: Engaged Affect (typically observed): Calm Orientation: : Oriented to Place, Oriented to Self, Oriented to  Time, Oriented to Situation Alcohol / Substance Use: Not Applicable Psych Involvement: No (comment)  Admission diagnosis:  Hyperkalemia [E87.5] Pneumonia [J18.9] Hypoxia [R09.02] AKI (  acute kidney injury) ([place]) [N17.9] Multifocal pneumonia [J18.9] HIV infection, unspecified symptom status ([place]) [B20] Patient Active Problem List   Diagnosis Date Noted   Septic shock ([place]) 03/08/2021   Acute hypoxemic respiratory failure ([place]) 03/05/2021   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  ([place]) 03/05/2021   Elevated LFTs 03/05/2021   Pneumonia 03/04/2021   Candida esophagitis ([place]) 02/19/2021   Neuropathic pain 02/19/2021   Polypharmacy 02/19/2021   Cough    N&V (nausea and vomiting)    AIDS (acquired immune deficiency syndrome) (H[facility])    Esophageal dysphagia    Gastroesophageal reflux disease with esophagitis without hemorrhage    Gastrointestinal hemorrhage    Abdominal pain    Creatinine elevation    Hypoglycemia 02/12/2021   HTN (hypertension) 02/12/2021   Anemia 02/12/2021   AKI (acute kidney injury) ([place]) 02/11/2021   PCP:  Elwyn Reach, MD Pharmacy:   [facility] [place], [place] - [address] AT [place] [address] [place] [address] Phone: [phone] Fax: [phone]  [facility] [place], [place] - [address] AT [facility] [address] [place] [address] Phone: [phone] Fax: [phone]     Social Determinants of Health (SDOH) Interventions    Readmission Risk Interventions No flowsheet data found.

## 2021-03-09 ENCOUNTER — Encounter: Payer: Self-pay | Admitting: Family

## 2021-03-09 ENCOUNTER — Inpatient Hospital Stay (HOSPITAL_COMMUNITY): Payer: Medicaid Other

## 2021-03-09 DIAGNOSIS — L8990 Pressure ulcer of unspecified site, unspecified stage: Secondary | ICD-10-CM | POA: Insufficient documentation

## 2021-03-09 DIAGNOSIS — M7989 Other specified soft tissue disorders: Secondary | ICD-10-CM | POA: Diagnosis not present

## 2021-03-09 DIAGNOSIS — B20 Human immunodeficiency virus [HIV] disease: Secondary | ICD-10-CM | POA: Diagnosis not present

## 2021-03-09 LAB — RENAL FUNCTION PANEL
Albumin: 2.2 g/dL — ABNORMAL LOW (ref 3.5–5.0)
Albumin: 2.2 g/dL — ABNORMAL LOW (ref 3.5–5.0)
Anion gap: 6 (ref 5–15)
Anion gap: 7 (ref 5–15)
BUN: 41 mg/dL — ABNORMAL HIGH (ref 6–20)
BUN: 43 mg/dL — ABNORMAL HIGH (ref 6–20)
CO2: 27 mmol/L (ref 22–32)
CO2: 25 mmol/L (ref 22–32)
Calcium: 8.1 mg/dL — ABNORMAL LOW (ref 8.9–10.3)
Calcium: 8.1 mg/dL — ABNORMAL LOW (ref 8.9–10.3)
Chloride: 103 mmol/L (ref 98–111)
Chloride: 105 mmol/L (ref 98–111)
Creatinine, Ser: 1.87 mg/dL — ABNORMAL HIGH (ref 0.61–1.24)
Creatinine, Ser: 1.69 mg/dL — ABNORMAL HIGH (ref 0.61–1.24)
GFR, Estimated: 45 mL/min — ABNORMAL LOW (ref >60)
GFR, Estimated: 51 mL/min — ABNORMAL LOW (ref >60)
Glucose, Bld: 187 mg/dL — ABNORMAL HIGH (ref 70–99)
Glucose, Bld: 137 mg/dL — ABNORMAL HIGH (ref 70–99)
Phosphorus: 3.5 mg/dL (ref 2.5–4.6)
Phosphorus: 3.5 mg/dL (ref 2.5–4.6)
Potassium: 4.4 mmol/L (ref 3.5–5.1)
Potassium: 4.1 mmol/L (ref 3.5–5.1)
Sodium: 136 mmol/L (ref 135–145)
Sodium: 137 mmol/L (ref 135–145)

## 2021-03-09 LAB — TYPE AND SCREEN
ABO/RH(D): O POS
Antibody Screen: NEGATIVE
Unit division: 0
Unit division: 0

## 2021-03-09 LAB — GLUCOSE, CAPILLARY
Glucose-Capillary: 198 mg/dL — ABNORMAL HIGH (ref 70–99)
Glucose-Capillary: 166 mg/dL — ABNORMAL HIGH (ref 70–99)
Glucose-Capillary: 157 mg/dL — ABNORMAL HIGH (ref 70–99)
Glucose-Capillary: 130 mg/dL — ABNORMAL HIGH (ref 70–99)
Glucose-Capillary: 126 mg/dL — ABNORMAL HIGH (ref 70–99)
Glucose-Capillary: 148 mg/dL — ABNORMAL HIGH (ref 70–99)

## 2021-03-09 LAB — HEMOGLOBIN A1C
Hgb A1c MFr Bld: 5.4 % (ref 4.8–5.6)
Mean Plasma Glucose: 108 mg/dL

## 2021-03-09 LAB — CULTURE, RESPIRATORY W GRAM STAIN

## 2021-03-09 LAB — BPAM RBC
Blood Product Expiration Date: 202208042359
Blood Product Expiration Date: 202208052359
ISSUE DATE / TIME: 202207030859
ISSUE DATE / TIME: 202207040050
Unit Type and Rh: 5100
Unit Type and Rh: 5100

## 2021-03-09 LAB — CULTURE, BLOOD (ROUTINE X 2)
Culture: NO GROWTH
Special Requests: ADEQUATE

## 2021-03-09 LAB — APTT: aPTT: 32 seconds (ref 24–36)

## 2021-03-09 LAB — MAGNESIUM: Magnesium: 2.3 mg/dL (ref 1.7–2.4)

## 2021-03-09 IMAGING — DX DG ABDOMEN 1V
1 series · 1 of 1 positions shown · non-contrast
Comparison: [DATE].

CLINICAL DATA: NG tube adjustment.

EXAM:
ABDOMEN - 1 VIEW

[abdomen kub]
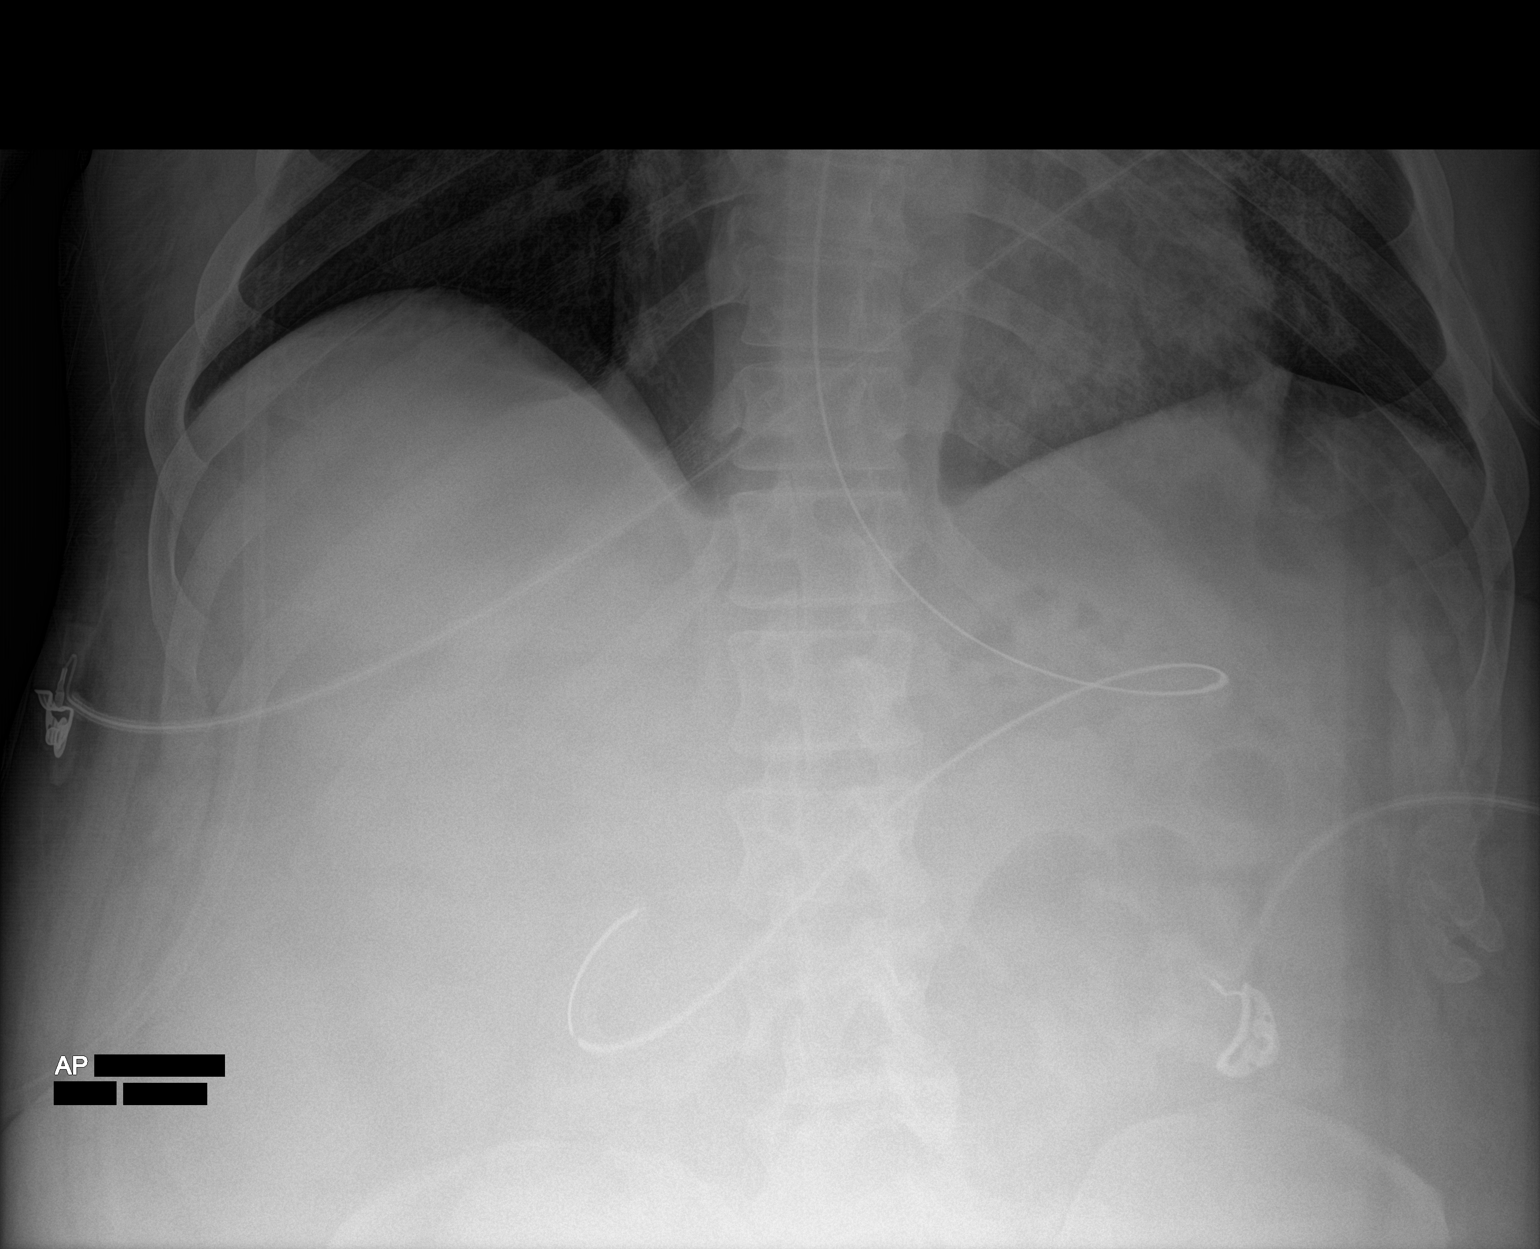

[1 of 1 positions shown; findings below may reference images not displayed]

FINDINGS: NG tube noted with its tip coiled over the distal stomach. No
gastric or bowel distention noted. No free air noted. Bilateral
pulmonary infiltrates.
IMPRESSION: NG tube noted with its tip coiled over the distal stomach.

## 2021-03-09 IMAGING — DX DG CHEST 1V PORT
1 series · 1 of 1 positions shown · non-contrast
Comparison: [DATE].

CLINICAL DATA: Respiratory failure.

EXAM:
PORTABLE CHEST 1 VIEW

[chest ap]
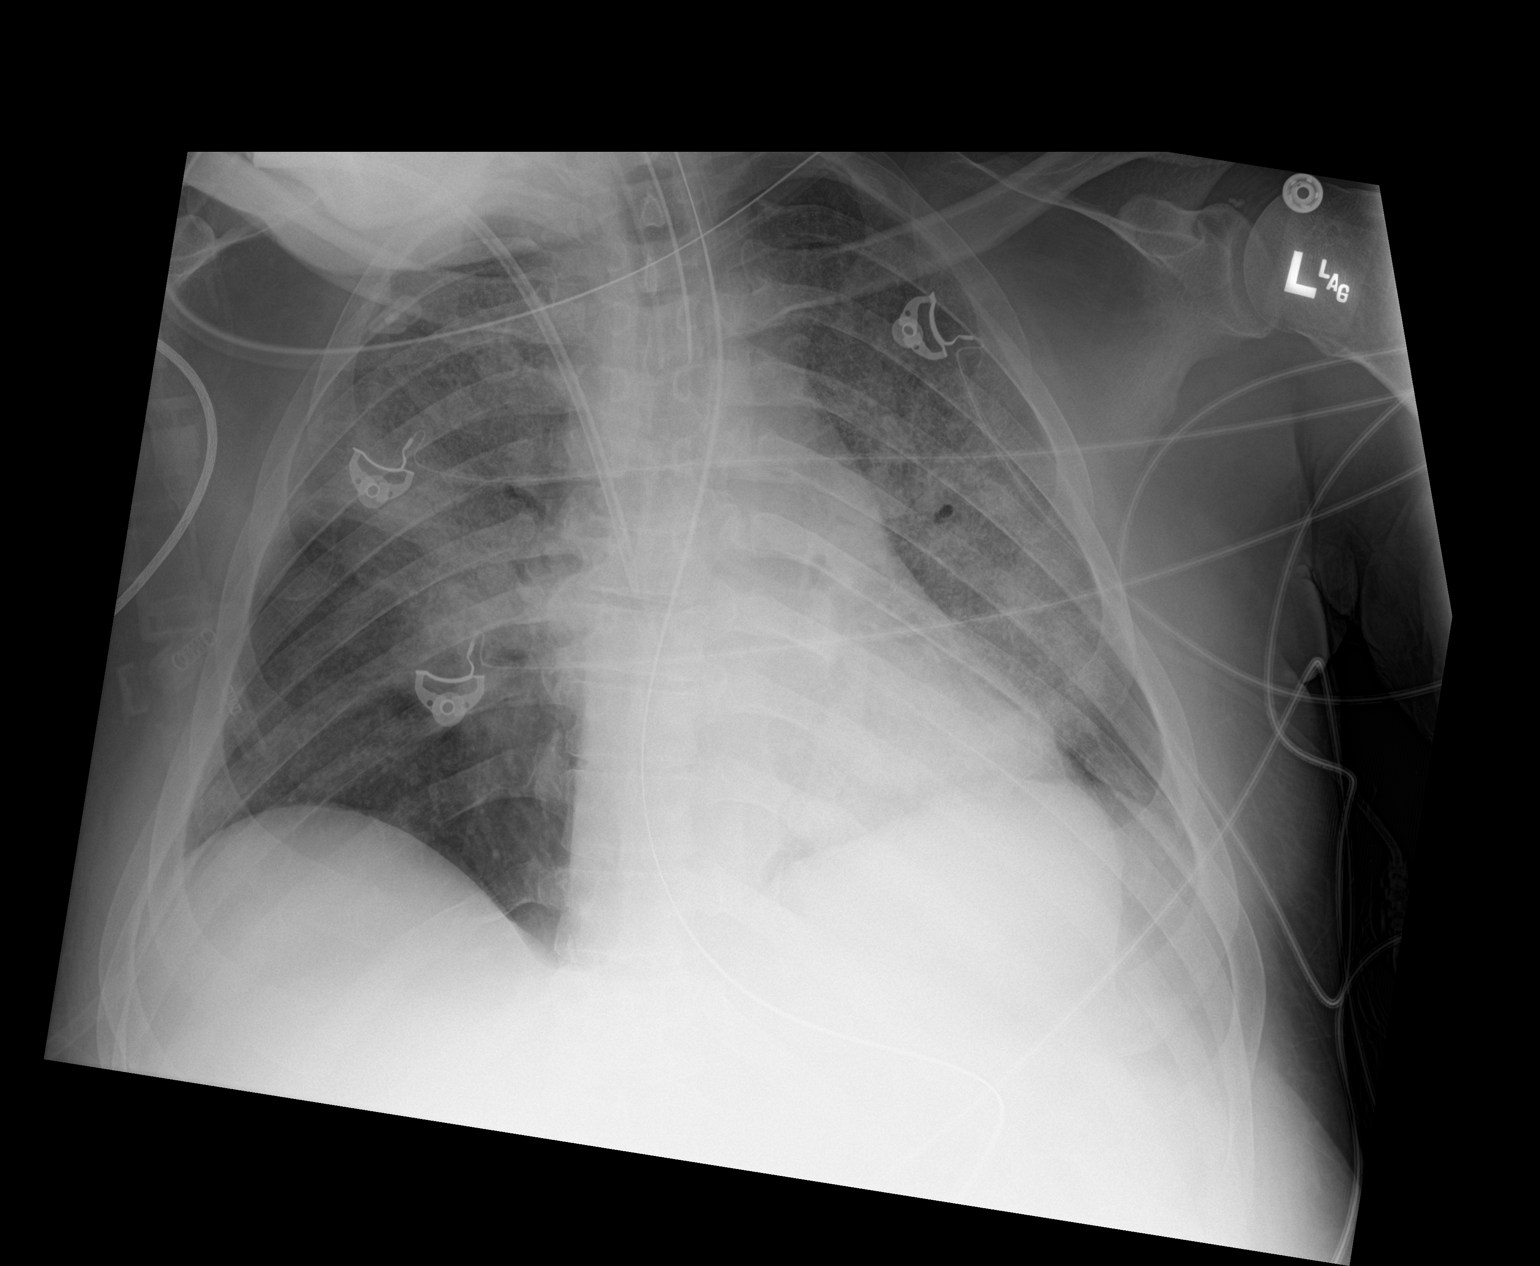

[1 of 1 positions shown; findings below may reference images not displayed]

FINDINGS: Endotracheal tube, NG tube, right IJ line in stable position. Heart
size stable. Multifocal bilateral pulmonary infiltrates/edema again
noted without interim change. Low lung volumes. No pleural effusion
or pneumothorax.
IMPRESSION: 1.  Lines and tubes in stable position.

2. Multifocal bilateral pulmonary infiltrates/edema again noted
without interim change.

## 2021-03-09 MED ORDER — CISATRACURIUM BESYLATE 20 MG/10ML IV SOLN
0.1000 mg/kg | Freq: Once | INTRAVENOUS | Status: AC
  Administered 2021-03-09: 9.6 mg via INTRAVENOUS
  Filled 2021-03-09: qty 10

## 2021-03-09 MED ORDER — CEFAZOLIN SODIUM-DEXTROSE 2-4 GM/100ML-% IV SOLN
2.0000 g | Freq: Two times a day (BID) | INTRAVENOUS | Status: DC
  Administered 2021-03-09 – 2021-03-10 (×2): 2 g via INTRAVENOUS
  Filled 2021-03-09 (×2): qty 100

## 2021-03-09 MED ORDER — MIDAZOLAM HCL 2 MG/2ML IJ SOLN
4.0000 mg | INTRAMUSCULAR | Status: DC | PRN
  Administered 2021-03-09 – 2021-03-10 (×4): 4 mg via INTRAVENOUS
  Filled 2021-03-09 (×2): qty 4

## 2021-03-09 MED ORDER — FENTANYL BOLUS VIA INFUSION
200.0000 ug | INTRAVENOUS | Status: DC | PRN
  Administered 2021-03-09 – 2021-03-16 (×17): 200 ug via INTRAVENOUS
  Filled 2021-03-09: qty 200

## 2021-03-09 MED ORDER — MIDAZOLAM HCL 2 MG/2ML IJ SOLN
4.0000 mg | Freq: Once | INTRAMUSCULAR | Status: AC
  Administered 2021-03-09: 4 mg via INTRAVENOUS
  Filled 2021-03-09: qty 4

## 2021-03-09 MED ORDER — FUROSEMIDE 10 MG/ML IJ SOLN
120.0000 mg | Freq: Two times a day (BID) | INTRAVENOUS | Status: DC
  Administered 2021-03-09 – 2021-03-12 (×6): 120 mg via INTRAVENOUS
  Filled 2021-03-09: qty 2
  Filled 2021-03-09 (×2): qty 12
  Filled 2021-03-09: qty 10
  Filled 2021-03-09: qty 2
  Filled 2021-03-09: qty 12
  Filled 2021-03-09: qty 2
  Filled 2021-03-09: qty 10

## 2021-03-09 MED ORDER — DAPSONE 100 MG PO TABS
100.0000 mg | ORAL_TABLET | Freq: Every day | ORAL | Status: DC
  Administered 2021-03-09 – 2021-03-12 (×4): 100 mg
  Filled 2021-03-09 (×4): qty 1

## 2021-03-09 MED ORDER — DEXMEDETOMIDINE HCL IN NACL 200 MCG/50ML IV SOLN
0.4000 ug/kg/h | INTRAVENOUS | Status: DC
  Administered 2021-03-09: 1 ug/kg/h via INTRAVENOUS
  Administered 2021-03-09: 0.4 ug/kg/h via INTRAVENOUS
  Administered 2021-03-10: 1 ug/kg/h via INTRAVENOUS
  Administered 2021-03-10: 1.2 ug/kg/h via INTRAVENOUS
  Administered 2021-03-10: 1 ug/kg/h via INTRAVENOUS
  Administered 2021-03-10 (×2): 1.2 ug/kg/h via INTRAVENOUS
  Administered 2021-03-10 (×6): 1 ug/kg/h via INTRAVENOUS
  Administered 2021-03-13 (×3): 0.8 ug/kg/h via INTRAVENOUS
  Administered 2021-03-13: 0.4 ug/kg/h via INTRAVENOUS
  Administered 2021-03-13 – 2021-03-14 (×3): 0.8 ug/kg/h via INTRAVENOUS
  Administered 2021-03-14: 0.7 ug/kg/h via INTRAVENOUS
  Administered 2021-03-14 (×2): 0.8 ug/kg/h via INTRAVENOUS
  Administered 2021-03-14 (×4): 0.7 ug/kg/h via INTRAVENOUS
  Administered 2021-03-15: 0.8 ug/kg/h via INTRAVENOUS
  Administered 2021-03-15: 0.6 ug/kg/h via INTRAVENOUS
  Administered 2021-03-15: 0.8 ug/kg/h via INTRAVENOUS
  Administered 2021-03-15: 0.9 ug/kg/h via INTRAVENOUS
  Administered 2021-03-15: 0.7 ug/kg/h via INTRAVENOUS
  Administered 2021-03-15: 0.6 ug/kg/h via INTRAVENOUS
  Administered 2021-03-15: 0.8 ug/kg/h via INTRAVENOUS
  Administered 2021-03-15: 0.6 ug/kg/h via INTRAVENOUS
  Administered 2021-03-16: 1.1 ug/kg/h via INTRAVENOUS
  Administered 2021-03-16: 1 ug/kg/h via INTRAVENOUS
  Administered 2021-03-16 (×2): 1.2 ug/kg/h via INTRAVENOUS
  Administered 2021-03-16: 0.9 ug/kg/h via INTRAVENOUS
  Administered 2021-03-16: 1.2 ug/kg/h via INTRAVENOUS
  Administered 2021-03-16: 0.5 ug/kg/h via INTRAVENOUS
  Administered 2021-03-16: 1.2 ug/kg/h via INTRAVENOUS
  Administered 2021-03-16: 0.6 ug/kg/h via INTRAVENOUS
  Administered 2021-03-17 (×4): 1.2 ug/kg/h via INTRAVENOUS
  Administered 2021-03-17: 1 ug/kg/h via INTRAVENOUS
  Filled 2021-03-09: qty 50
  Filled 2021-03-09: qty 100
  Filled 2021-03-09 (×2): qty 50
  Filled 2021-03-09: qty 100
  Filled 2021-03-09 (×12): qty 50
  Filled 2021-03-09 (×2): qty 100
  Filled 2021-03-09 (×3): qty 50
  Filled 2021-03-09: qty 100
  Filled 2021-03-09 (×4): qty 50
  Filled 2021-03-09: qty 100
  Filled 2021-03-09 (×4): qty 50
  Filled 2021-03-09: qty 100
  Filled 2021-03-09 (×2): qty 50
  Filled 2021-03-09 (×2): qty 100
  Filled 2021-03-09 (×7): qty 50

## 2021-03-09 MED ORDER — FUROSEMIDE 10 MG/ML IJ SOLN
80.0000 mg | Freq: Once | INTRAMUSCULAR | Status: AC
  Administered 2021-03-09: 80 mg via INTRAVENOUS
  Filled 2021-03-09: qty 8

## 2021-03-09 MED ORDER — CEFAZOLIN SODIUM-DEXTROSE 2-4 GM/100ML-% IV SOLN
2.0000 g | Freq: Three times a day (TID) | INTRAVENOUS | Status: DC
  Filled 2021-03-09: qty 100

## 2021-03-09 NOTE — Progress Notes (Signed)
Pt has remained off pressors throughout the day, but SBP now in low 90's. Per Dr. Joylene Grapes, RN is to try to keep pt off pressors and remove fluid as tolerated, even if goal not achieved. This RN will continue to carefully monitor patient's hemodynamics.

## 2021-03-09 NOTE — Progress Notes (Signed)
PHARMACY NOTE:  ANTIMICROBIAL RENAL DOSAGE ADJUSTMENT  Current antimicrobial regimen includes a mismatch between antimicrobial dosage and estimated renal function.  As per policy approved by the Pharmacy & Therapeutics and Medical Executive Committees, the antimicrobial dosage will be adjusted accordingly.  Current antimicrobial dosage:  Cefazolin 2g IV q8h  Indication: PNA  Renal Function:  Estimated Creatinine Clearance: 59.3 mL/min (A) (by C-G formula based on SCr of 1.87 mg/dL (H)). '[]'$      On intermittent HD, scheduled: '[x]'$      On CRRT    Antimicrobial dosage has been changed to:  2g IV q12h  Additional comments:   Thank you for allowing pharmacy to be a part of this patient's care.  Peggyann Juba, PharmD, BCPS Pharmacy: [phone] 03/09/2021 2:45 PM

## 2021-03-09 NOTE — Progress Notes (Signed)
Chaplain engaged in an initial visit with Joshue's "girlfriend" and mother.  They shared about Orey's healthcare journey of diabetes, low blood sugar, and losing a lot of weight.  40 mom stated she told him to get to the hospital right away when he expressed he wasn't feeling well.  They also shared that Brach can be very stubborn and that it had been hard to get him to seek medical attention.  They shared that Brycin can also be very loving, affectionate, and very determined.      Girlfriend stated that Kernie has been showing a lot of progress and that he had been very alert.  She had been able to talk to him.   Chaplain offered support, listening and presence.     03/09/21 1500  Clinical Encounter Type  Visited With Patient and family together  Visit Type Initial

## 2021-03-09 NOTE — Progress Notes (Signed)
eLink Physician-Brief Progress Note Patient Name: ORI BOLEJACK DOB: 31-Aug-1976 MRN: [id number]   Date of Service  03/09/2021  HPI/Events of Note  Patient with frequent watery stools, bedside RN requesting Flexiseal order.  eICU Interventions  Flexiseal ordered.        Kerry Kass Trudy Kory 03/09/2021, 9:59 PM

## 2021-03-09 NOTE — Progress Notes (Signed)
LUE venous duplex has been completed.  Preliminary findings given to [place], Therapist, sports.  Results can be found under chart review under CV PROC. 03/09/2021 2:07 PM Diala Waxman RVT, RDMS

## 2021-03-09 NOTE — Progress Notes (Signed)
  [facility]    Date of Admission:  03/04/2021   Total days of antibiotics 6/cefepime/clinda           ID: Bruce Hunter is a 44 y.o. 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 (HCC)   Elevated LFTs   Septic shock (HCC)   Pressure injury of skin    Subjective: Afebrile, remains intubated;still requiring CRRT -PJP negative -trach aspirate MSSA   ROS unable to obtain due to being intubated  Medications:   alteplase  2 mg Intracatheter Once   alteplase  2 mg Intracatheter Once   chlorhexidine gluconate (MEDLINE KIT)  15 mL Mouth Rinse BID   Chlorhexidine Gluconate Cloth  6 each Topical Daily   dolutegravir  50 mg Per Tube Daily   feeding supplement (PIVOT 1.5 CAL)  1,000 mL Per Tube Q24H   feeding supplement (PROSource TF)  90 mL Per Tube 5 X Daily   gabapentin  300 mg Per Tube Q8H   heparin  5,000 Units Subcutaneous Q8H   insulin aspart  0-15 Units Subcutaneous Q4H   ipratropium-albuterol  3 mL Nebulization Q4H   lamiVUDine  150 mg Per Tube Daily   mouth rinse  15 mL Mouth Rinse 10 times per day   methylPREDNISolone (SOLU-MEDROL) injection  40 mg Intravenous Q24H   pantoprazole sodium  40 mg Per Tube QHS   sodium chloride flush  10-40 mL Intracatheter Q12H    Objective: Vital signs in last 24 hours: Temp:  [96.8 F (36 C)-99.5 F (37.5 C)] 98.96 F (37.2 C) (07/05 1400) Pulse Rate:  [85-134] 99 (07/05 1400) Resp:  [13-31] 18 (07/05 1400) BP: (97-136)/(51-99) 98/63 (07/05 1400) SpO2:  [86 %-100 %] 94 % (07/05  1400) FiO2 (%):  [40 %-60 %] 50 % (07/05 1200) Weight:  [95 kg] 95 kg (07/05 0433) Physical Exam  Constitutional: He is sedated. He appears well-developed and well-nourished. No distress.  HENT: OETT in place Cardiovascular: Normal rate, regular rhythm and normal heart sounds. Exam reveals no gallop and no friction rub.  No murmur heard.  Pulmonary/Chest: Effort normal and breath sounds normal. No respiratory distress. He has no wheezes.  Abdominal: Soft. Bowel sounds are normal. He exhibits no distension. There is no tenderness.  Lymphadenopathy:  He has no cervical adenopathy.  Neurological: He is alert and oriented to person, place, and time.  Skin: Skin is warm and dry. No rash noted. No erythema.  Psychiatric: He has a normal mood and affect. His behavior is normal.    Lab Results Recent Labs    03/07/21 0452 03/07/21 1530 03/07/21 2150 03/08/21 0613 03/08/21 1612 03/09/21 0403  WBC 12.8*  --   --  11.2*  --   --   HGB 6.3*  --  6.2* 7.4*  --   --   HCT 21.8*  --  20.9* 24.5*  --   --   NA 134*   < >  --  135 137 136  K 5.7*   < >  --  5.0 4.5 4.4  CL 103   < >  --    103 104 103  CO2 21*   < >  --  _0 BUN 72*   < >  --  51* 44* 41*  CREATININE 3.99*   < >  --  2.51* 2.43* 1.87*   < > = values in this interval not displayed.   Liver Panel Recent Labs    03/08/21 1612 03/09/21 0403  ALBUMIN 2.2* 2.2*   Sedimentation Rate No results for input(s): ESRSEDRATE in the last 72 hours. C-Reactive Protein No results for input(s): CRP in the last 72 hours.  Microbiology: reviewed Studies/Results: DG Abd 1 View  Result Date: 03/09/2021 CLINICAL DATA:  NG tube adjustment. EXAM: ABDOMEN - 1 VIEW COMPARISON:  03/05/2021. FINDINGS: NG tube noted with its tip coiled over the distal stomach. No gastric or bowel distention noted. No free air noted. Bilateral pulmonary infiltrates. IMPRESSION: NG tube noted with its tip coiled over the distal stomach. Electronically Signed    By: Marcello Moores  Register   On: 03/09/2021 09:01   DG Chest Port 1 View  Result Date: 03/09/2021 CLINICAL DATA:  Respiratory failure. EXAM: PORTABLE CHEST 1 VIEW COMPARISON:  03/07/2021. FINDINGS: Endotracheal tube, NG tube, right IJ line in stable position. Heart size stable. Multifocal bilateral pulmonary infiltrates/edema again noted without interim change. Low lung volumes. No pleural effusion or pneumothorax. IMPRESSION: 1.  Lines and tubes in stable position. 2. Multifocal bilateral pulmonary infiltrates/edema again noted without interim change. Electronically Signed   By: Marcello Moores  Register   On: 03/09/2021 05:45   VAS [place] UPPER EXTREMITY VENOUS DUPLEX  Result Date: 03/09/2021 UPPER VENOUS STUDY  Patient Name:  Bruce Hunter  Date of Exam:   03/09/2021 Medical Rec #: [id number]       Accession #:    [id number] Date of Birth: 09-23-1975       Patient Gender: M Patient Age:   89Q Exam Location:  [facility] Procedure:      VAS [place] UPPER EXTREMITY VENOUS DUPLEX Referring Phys: [id number] MICHAEL B WERT --------------------------------------------------------------------------------  Indications: Swelling Other Indications: Previous IV placement to LUE. Limitations: Patient movement (AMS). Comparison Study: No previous exams Performing Technologist: Jody Hill RVT, RDMS  Examination Guidelines: A complete evaluation includes B-mode imaging, spectral Doppler, color Doppler, and power Doppler as needed of all accessible portions of each vessel. Bilateral testing is considered an integral part of a complete examination. Limited examinations for reoccurring indications may be performed as noted.  Right Findings: Patient coughing and being suctioned from right side - could not image RUE subclavian  Left Findings: +----------+------------+---------+-----------+----------+-------------------+ LEFT      CompressiblePhasicitySpontaneousProperties      Summary        +----------+------------+---------+-----------+----------+-------------------+ IJV           Full       Yes       Yes                                  +----------+------------+---------+-----------+----------+-------------------+ Subclavian    Full       Yes       Yes                                  +----------+------------+---------+-----------+----------+-------------------+ Axillary      Full       Yes       Yes                                  +----------+------------+---------+-----------+----------+-------------------+  Brachial      Full       Yes       Yes                                  +----------+------------+---------+-----------+----------+-------------------+ Radial        Full                                                      +----------+------------+---------+-----------+----------+-------------------+ Ulnar         Full                                  Not well visualized +----------+------------+---------+-----------+----------+-------------------+ Cephalic      None       No        No                      Acute        +----------+------------+---------+-----------+----------+-------------------+ Basilic       Full       Yes       Yes                                  +----------+------------+---------+-----------+----------+-------------------+  Summary:  Left: No evidence of deep vein thrombosis in the upper extremity. Findings consistent with acute superficial vein thrombosis involving the left cephalic vein.  *See table(s) above for measurements and observations.    Preliminary      Assessment/Plan: Acute hypoxic Respiratory failure s/p intubation/on vent, Culture MSSA pneumonia = narrowed his abtx to cefazolin, renally dosed  Esophageal candidiasis = continue with fluconazole 486m iv daily  Non-Oliguric AKI on CKD3 = thought to be due to sepsis, continues on CRRT with diuresis. Avoiding nephrotoxic medications  HIV disease=  continue lamivudine and tivicay.  OI prophylaxis = will start dapsone 100 mg daily and no longer need to do treatment for PJP pneumonia since DFA negative  [facility] Cell: [phone]: [phone]  03/09/2021, 2:41 PM

## 2021-03-09 NOTE — Procedures (Signed)
I saw and evaluated the patient on CRRT.  I have reviewed the session itself and made appropriate changes.  Starting to pull more fluid today. UOP picking up.  Filed Weights   03/07/21 0447 03/08/21 0357 03/09/21 0433  Weight: 95 kg 95.7 kg 95 kg    Recent Labs  Lab 03/09/21 0403  NA 136  K 4.4  CL 103  CO2 27  GLUCOSE 187*  BUN 41*  CREATININE 1.87*  CALCIUM 8.1*  PHOS 3.5    Recent Labs  Lab 03/04/21 1814 03/05/21 0130 03/05/21 0415 03/06/21 1150 03/07/21 0452 03/07/21 2150 03/08/21 0613  WBC 25.2* 26.5*   < > 12.3* 12.8*  --  11.2*  NEUTROABS 21.2* 20.9*  --   --   --   --   --   HGB 8.0* 7.0*   < > 7.3* 6.3* 6.2* 7.4*  HCT 25.8* 23.0*   < > 25.4* 21.8* 20.9* 24.5*  MCV 94.2 96.2   < > 101.6* 102.3*  --  98.8  PLT 301 270   < > 231 206  --  166   < > = values in this interval not displayed.    Scheduled Meds:  alteplase  2 mg Intracatheter Once   alteplase  2 mg Intracatheter Once   chlorhexidine gluconate (MEDLINE KIT)  15 mL Mouth Rinse BID   Chlorhexidine Gluconate Cloth  6 each Topical Daily   dolutegravir  50 mg Per Tube Daily   feeding supplement (PIVOT 1.5 CAL)  1,000 mL Per Tube Q24H   feeding supplement (PROSource TF)  90 mL Per Tube 5 X Daily   gabapentin  300 mg Per Tube Q8H   heparin  5,000 Units Subcutaneous Q8H   insulin aspart  0-15 Units Subcutaneous Q4H   ipratropium-albuterol  3 mL Nebulization Q4H   lamiVUDine  150 mg Per Tube Daily   mouth rinse  15 mL Mouth Rinse 10 times per day   methylPREDNISolone (SOLU-MEDROL) injection  40 mg Intravenous Q24H   pantoprazole sodium  40 mg Per Tube QHS   primaquine  30 mg Per Tube Q24H   sodium chloride flush  10-40 mL Intracatheter Q12H   Continuous Infusions:  sodium chloride 10 mL/hr at 03/09/21 1300   sodium chloride Stopped (03/07/21 1105)   ceFEPime (MAXIPIME) IV Stopped (03/09/21 1205)   clindamycin (CLEOCIN) IV 900 mg (03/09/21 1359)   fentaNYL infusion INTRAVENOUS 375 mcg/hr (03/09/21  1300)   fluconazole (DIFLUCAN) IV Stopped (03/09/21 0110)   midazolam 5.5 mg/hr (03/09/21 1346)   norepinephrine (LEVOPHED) Adult infusion Stopped (03/08/21 2230)   prismasol BGK 2/2.5 replacement solution 400 mL/hr at 03/09/21 0658   prismasol BGK 2/2.5 replacement solution 200 mL/hr at 03/08/21 1428   prismasol BGK 4/2.5 1,800 mL/hr at 03/09/21 1322   PRN Meds:.acetaminophen, albuterol, docusate, fentaNYL, guaiFENesin, heparin, midazolam, ondansetron (ZOFRAN) IV, polyethylene glycol, sodium chloride, sodium chloride flush   Santiago Bumpers,  MD 03/09/2021, 2:03 PM

## 2021-03-09 NOTE — Progress Notes (Signed)
Nephrology Follow-Up Consult note   Assessment/Recommendations: Bruce Hunter is a/an 45 y.o. male with a past medical history significant for HIV/AIDS, CKD, asthma, obesity, DM2 admitted for acute hypoxic respiratory failure with sepsis.      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  CKD with baseline of 2.5-3.  Urine output is picking up with some urinary retention yesterday -Continue CRRT with current prescription; increase UF to 100cc/hr given hypoxia -Agree with Foley catheter given urinary retention -IV Lasix 120 mg twice daily to assist with volume removal and hopefully liberate from CRRT -We will need to make a decision about future hemodialysis needs in the next 24 to 48 hours -Continue to monitor daily Cr, Dose meds for GFR -Monitor Daily I/Os, Daily weight  -Maintain MAP>65 for optimal renal perfusion.  -Avoid nephrotoxic medications including NSAIDs and Vanc/Zosyn combo -Continue treating sepsis as below  Acute hypoxic respiratory failure: Remains ventilated.  FiO2 back up today.  Ultrafiltration increase as above.  Management per primary  Septic shock: Antibiotics per primary team.  Shock significantly improved now off pressors.  Pneumonia: Concern for PJP.  Antibiotics and management per primary  HIV: ID managing  Hypertension: History of such.  Holding home blood pressure medications  DM2: Insulin management per primary  Anemia: Likely multifactorial with infection contributing.  Transfuse for hemoglobin less than 7 as needed  Recommendations conveyed to primary service.    [facility] 03/09/2021 2:05 PM  ___________________________________________________________  CC: Acute renal failure  Interval History/Subjective: The patient remains intubated and sedated.  He is more awake today but not interactive.  Off of pressors.  Did have more urine output yesterday.  Was found to have urinary  retention and Foley catheter now placed.  Given IV Lasix this morning with minimal response.  FiO2 was increased today due to worsening hypoxia.   Medications:  Current Facility-Administered Medications  Medication Dose Route Frequency Provider Last Rate Last Admin   0.9 %  sodium chloride infusion  250 mL Intravenous Continuous Candee Furbish, MD 10 mL/hr at 03/09/21 1300 Infusion Verify at 03/09/21 1300   0.9 %  sodium chloride infusion  250 mL Intravenous Continuous Anders Simmonds, MD   Stopped at 03/07/21 1105   acetaminophen (TYLENOL) 160 MG/5ML solution 650 mg  650 mg Per Tube Q6H PRN Tanda Rockers, MD       albuterol (PROVENTIL) (2.5 MG/3ML) 0.083% nebulizer solution 2.5 mg  2.5 mg Nebulization Q2H PRN Collier Bullock, MD       alteplase (CATHFLO ACTIVASE) injection 2 mg  2 mg Intracatheter Once Tanda Rockers, MD       alteplase (CATHFLO ACTIVASE) injection 2 mg  2 mg Intracatheter Once Roney Jaffe, MD       ceFEPIme (MAXIPIME) 2 g in sodium chloride 0.9 % 100 mL IVPB  2 g Intravenous Q12H Eudelia Bunch, RPH   Stopped at 03/09/21 1205   chlorhexidine gluconate (MEDLINE KIT) (PERIDEX) 0.12 % solution 15 mL  15 mL Mouth Rinse BID Candee Furbish, MD   15 mL at 03/09/21 6415   Chlorhexidine Gluconate Cloth 2 % PADS 6 each  6 each Topical Daily Candee Furbish, MD   6 each at 03/09/21 0252   clindamycin (CLEOCIN) IVPB 900 mg  900 mg Intravenous Everlene Other, MD 100 mL/hr at 03/09/21 1359 900 mg at 03/09/21 1359   docusate (COLACE) 50 MG/5ML liquid 100 mg  100  mg Per Tube BID PRN Tanda Rockers, MD   100 mg at 03/08/21 1358   dolutegravir (TIVICAY) tablet 50 mg  50 mg Per Tube Daily Tanda Rockers, MD   50 mg at 03/09/21 0913   feeding supplement (PIVOT 1.5 CAL) liquid 1,000 mL  1,000 mL Per Tube Q24H Tanda Rockers, MD 50 mL/hr at 03/09/21 1300 Infusion Verify at 03/09/21 1300   feeding supplement (PROSource TF) liquid 90 mL  90 mL Per Tube 5 X Daily Tanda Rockers,  MD   90 mL at 03/09/21 1401   fentaNYL (SUBLIMAZE) bolus via infusion 200 mcg  200 mcg Intravenous Q1H PRN Hunsucker, Bonna Gains, MD       fentaNYL 2564mg in NS 2532m(1027mml) infusion-PREMIX  0-400 mcg/hr Intravenous Continuous IzqTyna JakschD 37.5 mL/hr at 03/09/21 1300 375 mcg/hr at 03/09/21 1300   fluconazole (DIFLUCAN) IVPB 400 mg  400 mg Intravenous Q24H BelEudelia BunchPH   Stopped at 03/09/21 0110   gabapentin (NEURONTIN) 250 MG/5ML solution 300 mg  300 mg Per Tube Q8H WerTanda RockersD   300 mg at 03/09/21 1401   guaiFENesin (ROBITUSSIN) 100 MG/5ML solution 100 mg  5 mL Per Tube Q4H PRN WerTanda RockersD       heparin injection 1,000-6,000 Units  1,000-6,000 Units CRRT PRN SchRoney JaffeD   1,200 Units at 03/08/21 0350   heparin injection 5,000 Units  5,000 Units Subcutaneous Q8H GonCollier BullockD   5,000 Units at 03/09/21 1359   insulin aspart (novoLOG) injection 0-15 Units  0-15 Units Subcutaneous Q4H Ollis, Brandi L, NP   3 Units at 03/09/21 1203   ipratropium-albuterol (DUONEB) 0.5-2.5 (3) MG/3ML nebulizer solution 3 mL  3 mL Nebulization Q4H IzqTyna JakschD   3 mL at 03/09/21 1143   lamiVUDine (EPIVIR) tablet 150 mg  150 mg Per Tube Daily WerTanda RockersD   150 mg at 03/09/21 0913   MEDLINE mouth rinse  15 mL Mouth Rinse 10 times per day SmiCandee FurbishD   15 mL at 03/09/21 1401   methylPREDNISolone sodium succinate (SOLU-MEDROL) 40 mg/mL injection 40 mg  40 mg Intravenous Q24H GonCollier BullockD   40 mg at 03/08/21 2209   midazolam (VERSED) 50 mg/50 mL (1 mg/mL) premix infusion  1-10 mg/hr Intravenous Continuous WerChristinia Gully MD 5.5 mL/hr at 03/09/21 1346 5.5 mg/hr at 03/09/21 1346   midazolam (VERSED) injection 4 mg  4 mg Intravenous Q2H PRN Hunsucker, MatBonna GainsD       norepinephrine (LEVOPHED) 16 mg in 250m51memix infusion  0-40 mcg/min Intravenous Titrated WertTanda Rockers   Stopped at 03/08/21 2230   ondansetron (ZOFRAN) injection 4  mg  4 mg Intravenous Q6H PRN GonzCollier Bullock       pantoprazole sodium (PROTONIX) 40 mg/20 mL oral suspension 40 mg  40 mg Per Tube QHS BellLeodis SiasRPH   40 mg at 03/08/21 2230   polyethylene glycol (MIRALAX / GLYCOLAX) packet 17 g  17 g Per Tube Daily PRN WertTanda Rockers       primaquine tablet 30 mg  30 mg Per Tube Q24H BellLeodis SiasRPH   30 mg at 03/08/21 2209   prismasol BGK 2/2.5 replacement solution   CRRT Continuous ScheRoney Jaffe 400 mL/hr at 03/09/21 0658 New Bag at 03/09/21 0658   prismasol BGK 2/2.5 replacement solution   CRRT Continuous Schertz,  Herbie Baltimore, MD 200 mL/hr at 03/08/21 1428 New Bag at 03/08/21 1428   prismasol BGK 4/2.5 infusion   CRRT Continuous Roney Jaffe, MD 1,800 mL/hr at 03/09/21 1322 New Bag at 03/09/21 1322   sodium chloride 0.9 % primer fluid for CRRT   CRRT PRN Roney Jaffe, MD       sodium chloride flush (NS) 0.9 % injection 10-40 mL  10-40 mL Intracatheter Q12H Tanda Rockers, MD   10 mL at 03/09/21 0913   sodium chloride flush (NS) 0.9 % injection 10-40 mL  10-40 mL Intracatheter PRN Tanda Rockers, MD          Review of Systems: Unable to obtain due to the patient's sedation  Physical Exam: Vitals:   03/09/21 1330 03/09/21 1350  BP: 116/61   Pulse: (!) 101 (!) 104  Resp: 18 18  Temp: 98.96 F (37.2 C) 98.96 F (37.2 C)  SpO2: 96% 94%   Total I/O In: 588.4 [I.V.:321.1; NG/GT:150; IV Piggyback:117.2] Out: 876.6 [Urine:230; Other:646.6]  Intake/Output Summary (Last 24 hours) at 03/09/2021 1405 Last data filed at 03/09/2021 1300 Gross per 24 hour  Intake 3226.25 ml  Output 4388.7 ml  Net -1162.45 ml   Constitutional: Ill-appearing, intubated and sedated ENMT: ears and nose without scars or lesions, MMM CV: Tachycardic, edema present in all 4 extremities worst in left upper extremity Respiratory: Coarse bilateral breath sounds, ventilated, bilateral chest rise Gastrointestinal: soft, non-tender, no palpable masses  or hernias Skin: no visible lesions or rashes Psych: Sedated, does not respond to questioning   Test Results I personally reviewed new and old clinical labs and radiology tests Lab Results  Component Value Date   NA 136 03/09/2021   K 4.4 03/09/2021   CL 103 03/09/2021   CO2 27 03/09/2021   BUN 41 (H) 03/09/2021   CREATININE 1.87 (H) 03/09/2021   CALCIUM 8.1 (L) 03/09/2021   ALBUMIN 2.2 (L) 03/09/2021   PHOS 3.5 03/09/2021

## 2021-03-09 NOTE — Progress Notes (Signed)
Notified MD Hunsucker in regards to patient having a superficial clot found on vascular ultrasound. Will continue to monitor and assess.

## 2021-03-09 NOTE — Progress Notes (Signed)
NAME:  Bruce Hunter, MRN:  [id number], DOB:  1976-04-12, LOS: 5 ADMISSION DATE:  03/04/2021, CONSULTATION DATE:  03/04/21 REFERRING MD:  Melina Copa, CHIEF COMPLAINT:  Shortness of breath.     Brief Narrative:  45 yo man with HIV/AIDS here with acute hypoxemic respiratory failure.  Complaints of SOB and cough productive of tan sputum.  Has had respiratory symptoms since he was discharged earlier this month (6/13).     Recently diagnosed with HIV/AIDS, started on medications, but patient tells me he "hasn't had time to start taking them" but he isn't really able to explain further.  Patient was initiated on lamivudine, Tivicay, dapsone and fluconazole on discharge.  Patient does have an appointment with infectious disease as outpatient.  Candida esophagitis. At the time.    ID note 6/17:  DTG plus lamivudine. He can get the combined pill, dovato, upon discharge. 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 1025mdaily  Pertinent  Medical History  Asthma DM2 Gout HTN Obesity  GERD AKI Neuropathy  Significant Hospital Events:  6/30 admitted for PNA with known HIV/AIDS 7/1 ET >>> Echo 7/1 mild  RA dilation with RV low nl fxn mild PAS elevation and nl LV 7/2 HD catheter >>> 7/5, Fi O2 50%, Pneumocystis DFA negative 03/06/21 ([facility]), MSSA on LRCx    Scheduled Meds:  alteplase  2 mg Intracatheter Once   alteplase  2 mg Intracatheter Once   chlorhexidine gluconate (MEDLINE KIT)  15 mL Mouth Rinse BID   Chlorhexidine Gluconate Cloth  6 each Topical Daily   dolutegravir  50 mg Per Tube Daily   feeding supplement (PIVOT 1.5 CAL)  1,000 mL Per Tube Q24H   feeding supplement (PROSource TF)  90 mL Per Tube 5 X Daily   gabapentin  300 mg Per Tube Q8H   heparin  5,000 Units Subcutaneous Q8H   insulin aspart  0-15 Units Subcutaneous Q4H    ipratropium-albuterol  3 mL Nebulization Q4H   lamiVUDine  150 mg Per Tube Daily   mouth rinse  15 mL Mouth Rinse 10 times per day   methylPREDNISolone (SOLU-MEDROL) injection  40 mg Intravenous Q24H   pantoprazole sodium  40 mg Per Tube QHS   primaquine  30 mg Per Tube Q24H   sodium chloride flush  10-40 mL Intracatheter Q12H   Continuous Infusions:  sodium chloride 10 mL/hr at 03/09/21 0900   sodium chloride Stopped (03/07/21 1105)   ceFEPime (MAXIPIME) IV Stopped (03/09/21 0151)   clindamycin (CLEOCIN) IV Stopped (03/09/21 0550)   fentaNYL infusion INTRAVENOUS 375 mcg/hr (03/09/21 0900)   fluconazole (DIFLUCAN) IV Stopped (03/09/21 0110)   midazolam 3.5 mg/hr (03/09/21 0920)   norepinephrine (LEVOPHED) Adult infusion Stopped (03/08/21 2230)   prismasol BGK 2/2.5 replacement solution 400 mL/hr at 03/09/21 0658   prismasol BGK 2/2.5 replacement solution 200 mL/hr at 03/08/21 1428   prismasol BGK 4/2.5 1,800 mL/hr at 03/09/21 0659   PRN Meds:.acetaminophen, albuterol, docusate, fentaNYL, guaiFENesin, heparin, midazolam, ondansetron (ZOFRAN) IV, polyethylene glycol, sodium chloride, sodium chloride flush     Interim History / Subjective:  NAEON. fi O2 up a bit. Weaning down this AM.  Objective   Blood pressure 118/68, pulse (!) 103, temperature 98.24 F (36.8 C), resp. rate 17, height 5' 11"  (1.803 m), weight 95 kg, SpO2 96 %. CVP:  [8 mmHg-9 mmHg] 8 mmHg  Vent Mode: PRVC FiO2 (%):  [40 %-60 %] 50 % Set Rate:  [20 bmp] 20 bmp Vt Set:  [550 mL] 550 mL PEEP:  [8 cmH20] 8 cmH20 Plateau Pressure:  [18 cmH20-26 cmH20] 21 cmH20   Intake/Output Summary (Last 24 hours) at 03/09/2021 0952 Last data filed at 03/09/2021 0913 Gross per 24 hour  Intake 3580.71 ml  Output 4268.2 ml  Net -687.49 ml    Filed Weights   03/07/21 0447 03/08/21 0357 03/09/21 0433  Weight: 95 kg 95.7 kg 95 kg   CVP:  [8 mmHg-9 mmHg] 8 mmHg    Examination: General: eyes open, in NAD CV: tachy, no  murmurs Pulm: Coarse, triggerign breaths on vent Neuro: Opens eyes, alert    Resolved Hospital Problem list     Assessment & Plan:   Acute hypoxemic respiratory failure due to pneumonia: AIDS, broad differential. DFA negative, MSSA on LRCx. -PRVC, wean FiO2 --VAP bundle --f/u cultures  PNA, immunocompromised in the setting of HIV/AIDS: MSSA on culture. DFA negative -advcate to narrow to cefazolin --Abx per ID  Severe sepsis/Septic shock due to pneumonia: Kidney injury, lactate elevated. Improved. --As above, pressors off  AIDS/HIV: Low CD 4, HIV viral load >1.47 mil --ID following / rx per ID    Acute renal failure on CKD 3b: Presumed ATN due to septic shock. 1.4L UOP 7/4 --CRRT per renal  LE edema, improved: Due to Hypoalbuminemia   Elevated LFTs : Elevated AP, AST 70, ALT 32. NAFLD possible. --viral hepatis panel negative  Hx of HTN: Home medications include Norvasc, HCTZ, Lisinopril,  - Holding  home meds   Type 2 diabetes Neuropathic pain: in feet --SSI increased to moderate ssi 7/2  > better 7/3  --CBG checks q4  Anemia: Due to chronic disease, inflammation, AIDS.  --Transfuse Hgb < 7    Best Practice    Diet/type: tf per nutrition  DVT prophylaxis: prophylactic heparin  GI prophylaxis: PPI Lines:  HD catheter Foley:  N/A Code Status:  full code Last date of multidisciplinary goals of care discussion  Updated gf at bedside  CRITICAL CARE Performed by: Lanier Clam   Total critical care time: 40 minutes  Critical care time was exclusive of separately billable procedures and treating other patients.  Critical care was necessary to treat or prevent imminent or life-threatening deterioration.  Critical care was time spent personally by me on the following activities: development of treatment plan with patient and/or surrogate as well as nursing, discussions with consultants, evaluation of patient's response to treatment, examination of  patient, obtaining history from patient or surrogate, ordering and performing treatments and interventions, ordering and review of laboratory studies, ordering and review of radiographic studies, pulse oximetry and re-evaluation of patient's condition.  Lanier Clam, MD See Shea Evans for contact info

## 2021-03-09 NOTE — Progress Notes (Signed)
eLink Physician-Brief Progress Note Patient Name: Bruce Hunter DOB: December 16, 1975 MRN: [id number]   Date of Service  03/09/2021  HPI/Events of Note  Patient with extreme ventilator dyssynchrony despite 8.5 mg / hour of Versed gtt and 400 mcg / hour of Fentanyl gtt.  eICU Interventions  Precedex gtt ordered, Versed 4 mg iv x 1 bolus followed by Nimbex 0.1 mg / kg iv bolus x 1 to try to take away his respiratory drive and allow a reset with re-establishment of ventilator synchrony.        Kerry Kass Markita Stcharles 03/09/2021, 8:13 PM

## 2021-03-10 ENCOUNTER — Inpatient Hospital Stay (HOSPITAL_COMMUNITY): Payer: Medicaid Other

## 2021-03-10 DIAGNOSIS — G9341 Metabolic encephalopathy: Secondary | ICD-10-CM

## 2021-03-10 DIAGNOSIS — B20 Human immunodeficiency virus [HIV] disease: Secondary | ICD-10-CM | POA: Diagnosis not present

## 2021-03-10 LAB — GLUCOSE, CAPILLARY
Glucose-Capillary: 144 mg/dL — ABNORMAL HIGH (ref 70–99)
Glucose-Capillary: 180 mg/dL — ABNORMAL HIGH (ref 70–99)
Glucose-Capillary: 183 mg/dL — ABNORMAL HIGH (ref 70–99)
Glucose-Capillary: 186 mg/dL — ABNORMAL HIGH (ref 70–99)
Glucose-Capillary: 216 mg/dL — ABNORMAL HIGH (ref 70–99)
Glucose-Capillary: 219 mg/dL — ABNORMAL HIGH (ref 70–99)

## 2021-03-10 LAB — COMPREHENSIVE METABOLIC PANEL
ALT: 17 U/L (ref 0–44)
AST: 23 U/L (ref 15–41)
Albumin: 2 g/dL — ABNORMAL LOW (ref 3.5–5.0)
Alkaline Phosphatase: 65 U/L (ref 38–126)
Anion gap: 8 (ref 5–15)
BUN: 59 mg/dL — ABNORMAL HIGH (ref 6–20)
CO2: 28 mmol/L (ref 22–32)
Calcium: 8.4 mg/dL — ABNORMAL LOW (ref 8.9–10.3)
Chloride: 104 mmol/L (ref 98–111)
Creatinine, Ser: 1.73 mg/dL — ABNORMAL HIGH (ref 0.61–1.24)
GFR, Estimated: 49 mL/min — ABNORMAL LOW (ref >60)
Glucose, Bld: 158 mg/dL — ABNORMAL HIGH (ref 70–99)
Potassium: 3.9 mmol/L (ref 3.5–5.1)
Sodium: 140 mmol/L (ref 135–145)
Total Bilirubin: 0.5 mg/dL (ref 0.3–1.2)
Total Protein: 6.7 g/dL (ref 6.5–8.1)

## 2021-03-10 LAB — CBC
HCT: 26.1 % — ABNORMAL LOW (ref 39.0–52.0)
Hemoglobin: 7.8 g/dL — ABNORMAL LOW (ref 13.0–17.0)
MCH: 30.2 pg (ref 26.0–34.0)
MCHC: 29.9 g/dL — ABNORMAL LOW (ref 30.0–36.0)
MCV: 101.2 fL — ABNORMAL HIGH (ref 80.0–100.0)
Platelets: 184 10*3/uL (ref 150–400)
RBC: 2.58 MIL/uL — ABNORMAL LOW (ref 4.22–5.81)
RDW: 20.2 % — ABNORMAL HIGH (ref 11.5–15.5)
WBC: 16.7 10*3/uL — ABNORMAL HIGH (ref 4.0–10.5)
nRBC: 0.2 % (ref 0.0–0.2)

## 2021-03-10 LAB — RENAL FUNCTION PANEL
Albumin: 2.3 g/dL — ABNORMAL LOW (ref 3.5–5.0)
Anion gap: 8 (ref 5–15)
BUN: 42 mg/dL — ABNORMAL HIGH (ref 6–20)
CO2: 26 mmol/L (ref 22–32)
Calcium: 8.2 mg/dL — ABNORMAL LOW (ref 8.9–10.3)
Chloride: 103 mmol/L (ref 98–111)
Creatinine, Ser: 1.58 mg/dL — ABNORMAL HIGH (ref 0.61–1.24)
GFR, Estimated: 55 mL/min — ABNORMAL LOW (ref >60)
Glucose, Bld: 227 mg/dL — ABNORMAL HIGH (ref 70–99)
Phosphorus: 2.9 mg/dL (ref 2.5–4.6)
Potassium: 4.3 mmol/L (ref 3.5–5.1)
Sodium: 137 mmol/L (ref 135–145)

## 2021-03-10 LAB — BLOOD GAS, ARTERIAL
Acid-Base Excess: 1.4 mmol/L (ref 0.0–2.0)
Bicarbonate: 26.3 mmol/L (ref 20.0–28.0)
FIO2: 60
O2 Saturation: 93.4 %
Patient temperature: 98.6
pCO2 arterial: 46.4 mmHg (ref 32.0–48.0)
pH, Arterial: 7.372 (ref 7.350–7.450)
pO2, Arterial: 76.1 mmHg — ABNORMAL LOW (ref 83.0–108.0)

## 2021-03-10 LAB — MAGNESIUM
Magnesium: 2.4 mg/dL (ref 1.7–2.4)
Magnesium: 2.1 mg/dL (ref 1.7–2.4)

## 2021-03-10 LAB — APTT: aPTT: 32 seconds (ref 24–36)

## 2021-03-10 LAB — PNEUMOCYSTIS JIROVECI SMEAR BY DFA: Pneumocystis jiroveci Ag: NEGATIVE

## 2021-03-10 IMAGING — DX DG ABDOMEN 1V
1 series · 1 of 1 positions shown · non-contrast
Comparison: Radiograph yesterday.

CLINICAL DATA: OG tube placement.

EXAM:
ABDOMEN - 1 VIEW

[abdomen kub]
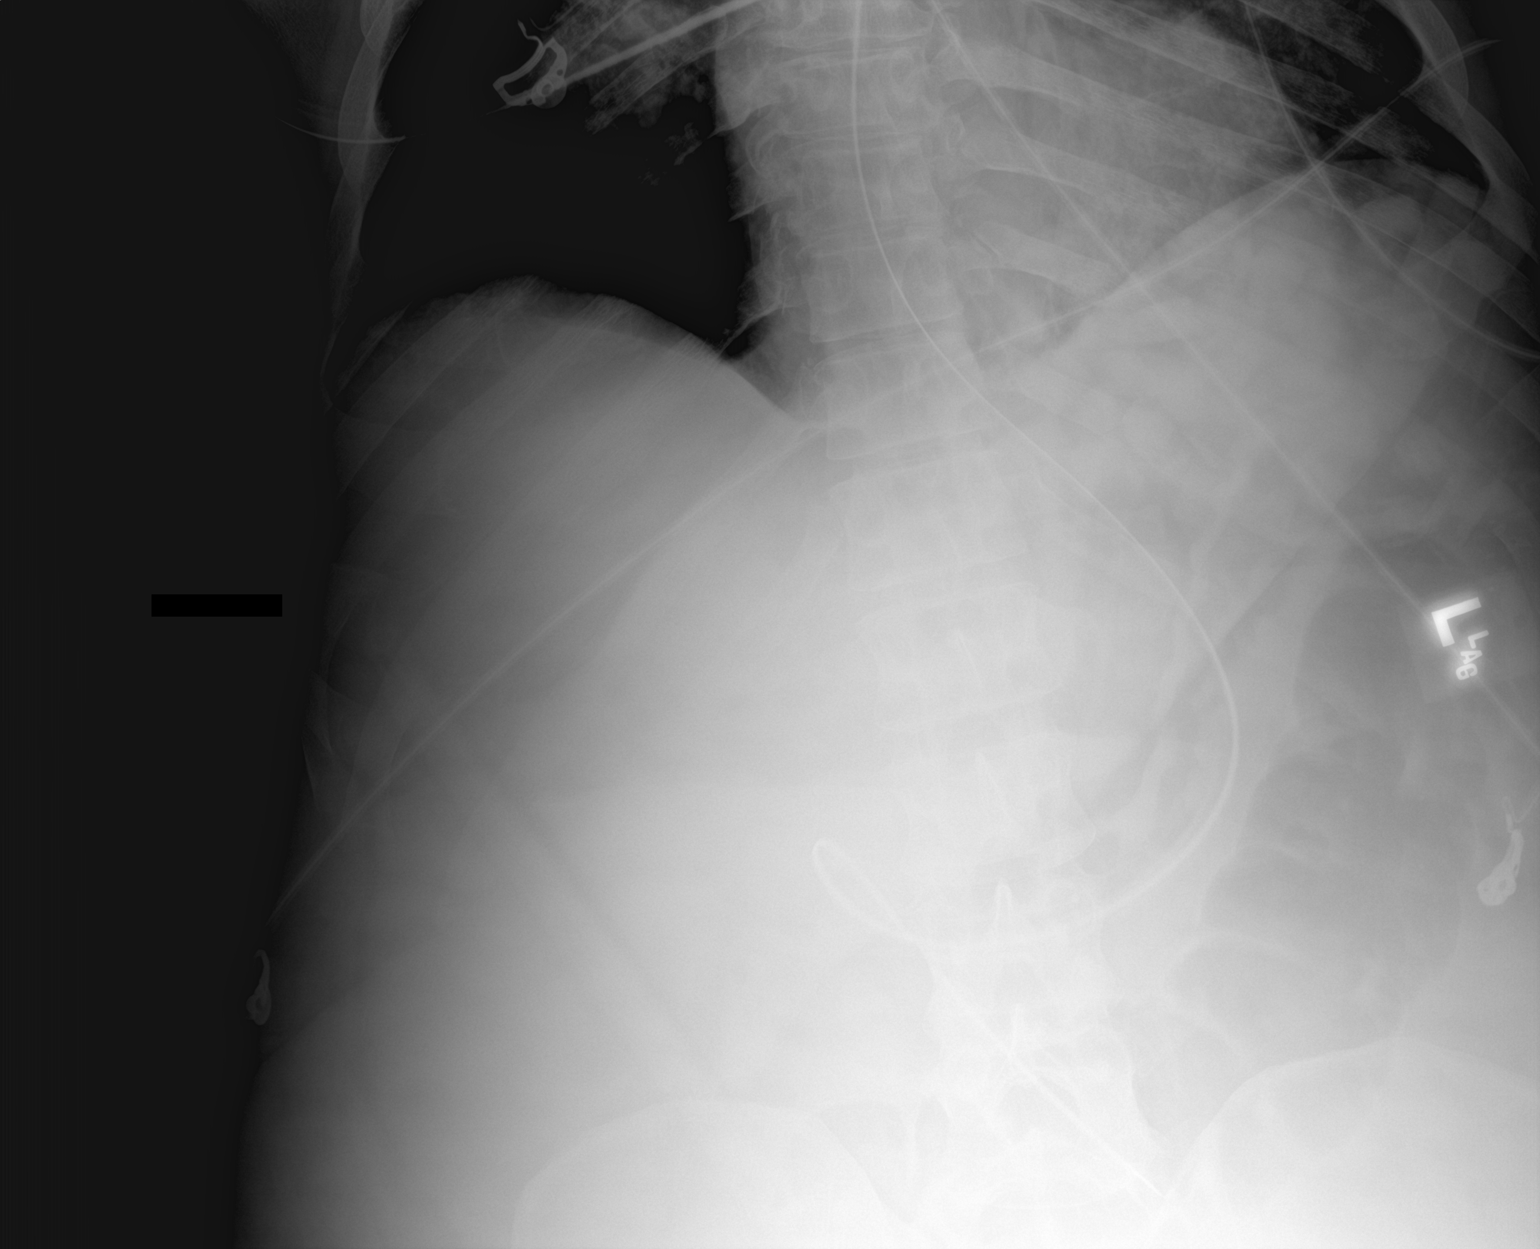

[1 of 1 positions shown; findings below may reference images not displayed]

FINDINGS: The enteric tube is in the right mid abdomen, tip likely in the
third portion of the duodenum. The side-port is not well seen. No
small bowel distension.
IMPRESSION: Enteric tube with tip in the right mid abdomen, likely in the third
portion of the duodenum.

## 2021-03-10 IMAGING — DX DG CHEST 1V PORT
1 series · 1 of 1 positions shown · non-contrast
Comparison: Earlier today at [DATE] p.m.

CLINICAL DATA: Endotracheal tube position.  Respiratory failure.

EXAM:
PORTABLE CHEST 1 VIEW

[chest ap]
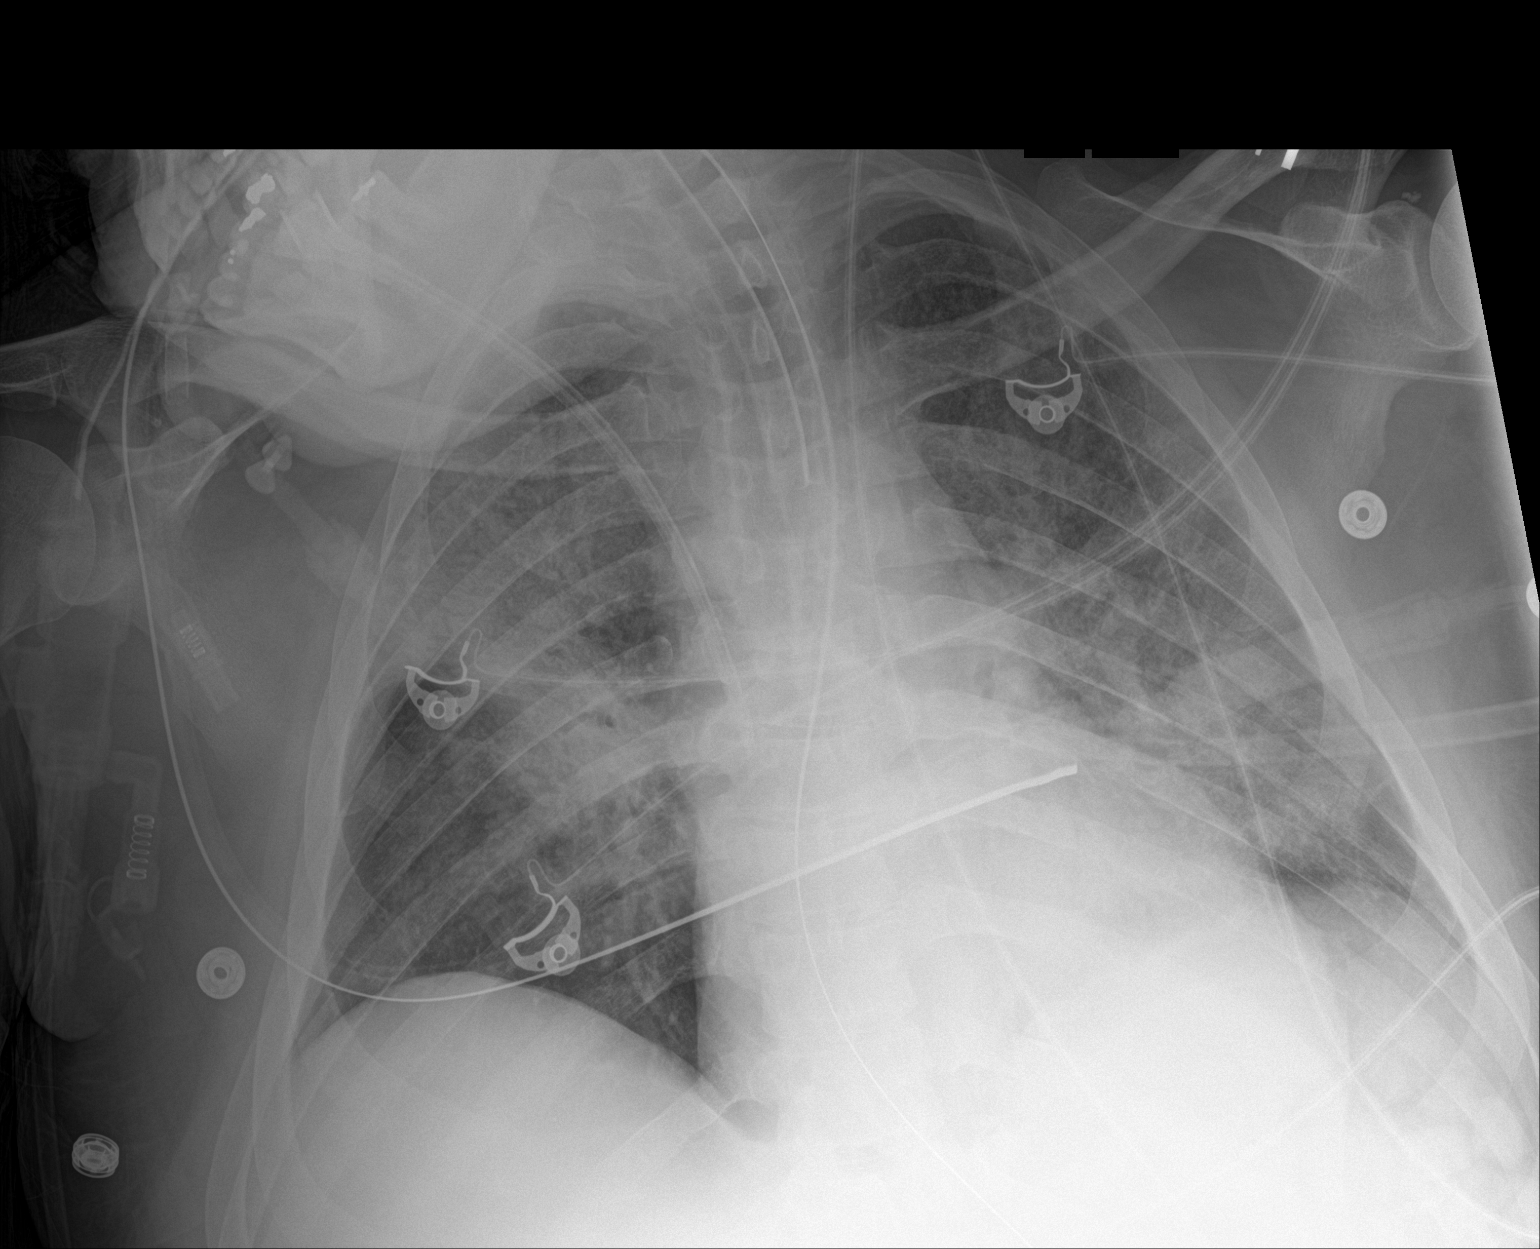

[1 of 1 positions shown; findings below may reference images not displayed]

FINDINGS: Endotracheal tube tip 3.7 cm from the carina. Enteric tube in place
with tip below the diaphragm. Right dialysis catheter in place.
Stable bilateral airspace disease from earlier today. No
pneumothorax or other interval change.
IMPRESSION: 1. Endotracheal tube tip 3.7 cm from the carina. Enteric tube in
place.
2. Stable bilateral airspace disease from earlier today.

## 2021-03-10 IMAGING — DX DG CHEST 1V PORT
1 series · 1 of 1 positions shown · non-contrast
Comparison: [DATE], [DATE], [DATE], [DATE]

CLINICAL DATA: Intubated

EXAM:
PORTABLE CHEST 1 VIEW

[chest ap]
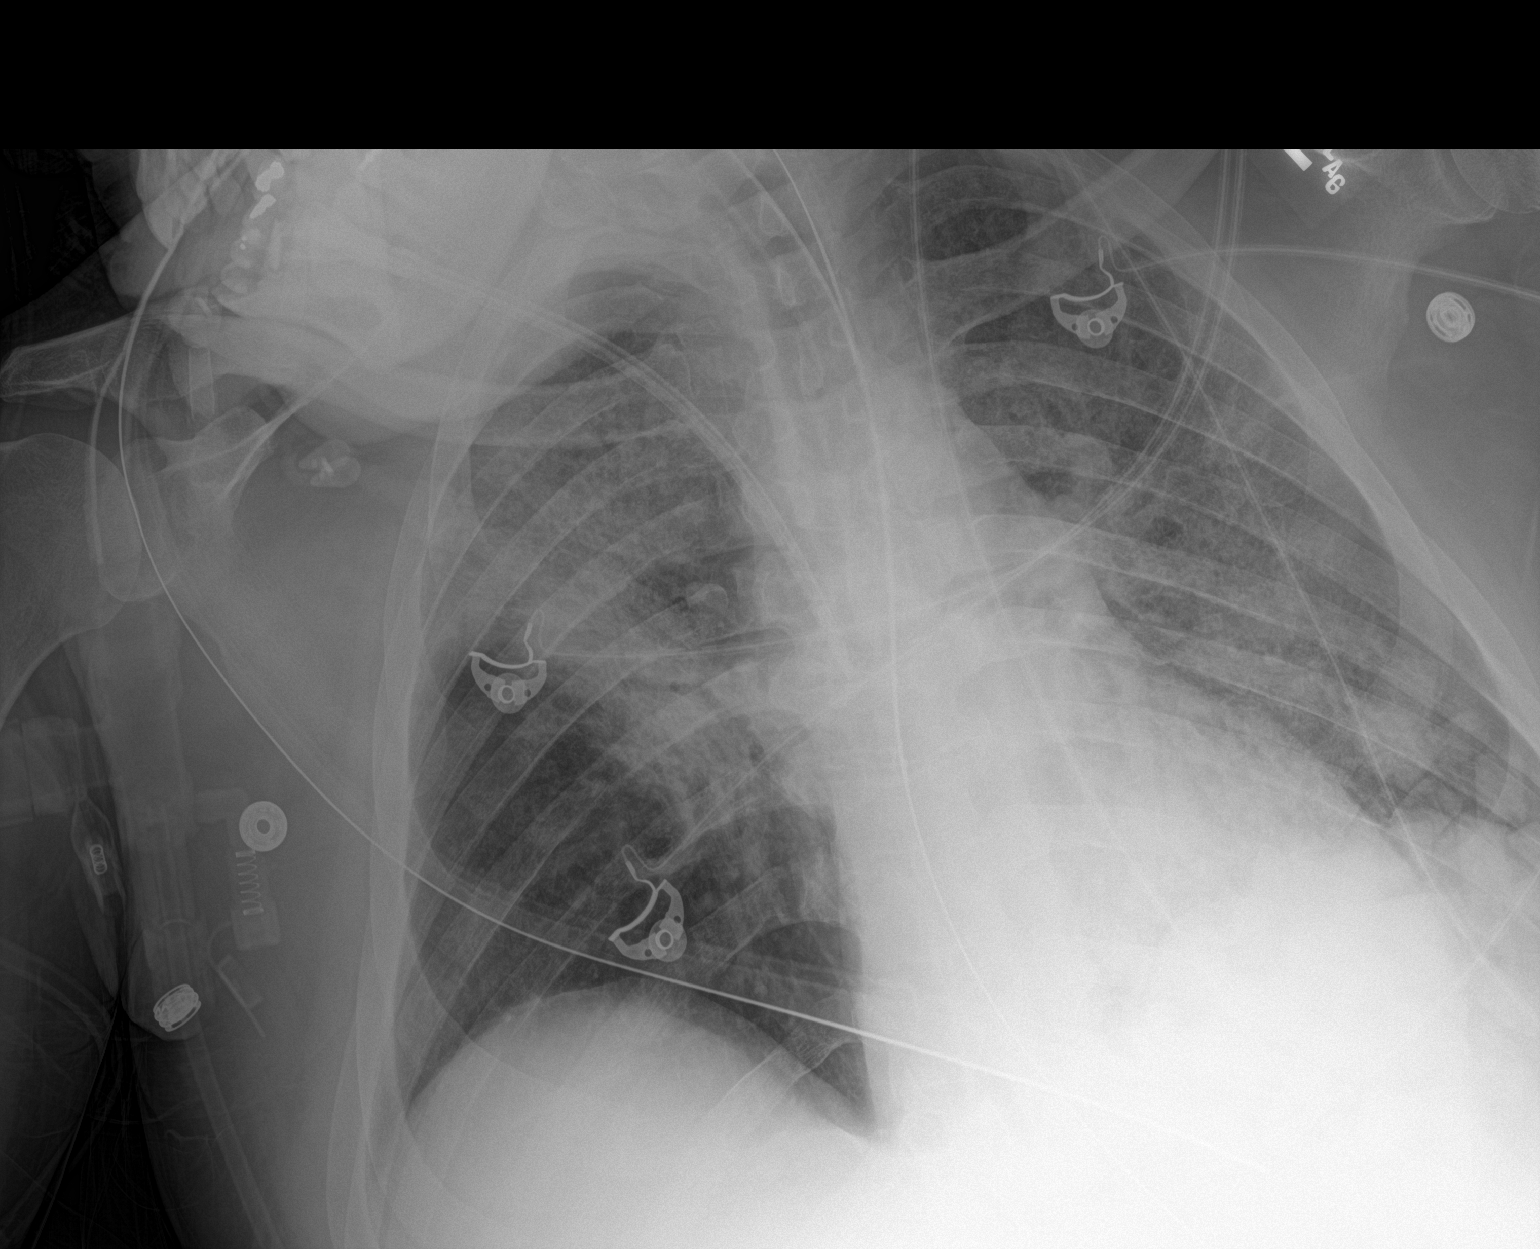

[1 of 1 positions shown; findings below may reference images not displayed]

FINDINGS: Endotracheal tube tip is about 4 cm superior to carina. Right-sided
central venous catheter tip over the SVC. Esophageal tube tip below
the diaphragm but incompletely visualized. No significant interval
change in extensive bilateral ground-glass opacities and mild
consolidations. Stable cardiomediastinal silhouette. No
pneumothorax.
IMPRESSION: 1. Endotracheal tube tip about 4 cm superior to carina.
2. No significant interval change in fairly extensive bilateral
airspace disease since radiograph 1 day prior

## 2021-03-10 MED ORDER — MIDAZOLAM BOLUS VIA INFUSION
4.0000 mg | INTRAVENOUS | Status: DC | PRN
  Administered 2021-03-10 (×3): 4 mg via INTRAVENOUS
  Filled 2021-03-10: qty 4

## 2021-03-10 MED ORDER — PIVOT 1.5 CAL PO LIQD
1000.0000 mL | ORAL | Status: DC
  Administered 2021-03-10 – 2021-03-16 (×9): 1000 mL
  Filled 2021-03-10 (×8): qty 1000

## 2021-03-10 MED ORDER — CEFAZOLIN SODIUM-DEXTROSE 2-4 GM/100ML-% IV SOLN
2.0000 g | Freq: Three times a day (TID) | INTRAVENOUS | Status: DC
  Administered 2021-03-10 – 2021-03-12 (×6): 2 g via INTRAVENOUS
  Filled 2021-03-10 (×6): qty 100

## 2021-03-10 MED ORDER — PROPOFOL 1000 MG/100ML IV EMUL
5.0000 ug/kg/min | INTRAVENOUS | Status: DC
  Administered 2021-03-11 (×3): 40 ug/kg/min via INTRAVENOUS
  Administered 2021-03-11: 5 ug/kg/min via INTRAVENOUS
  Administered 2021-03-11 – 2021-03-12 (×2): 40 ug/kg/min via INTRAVENOUS
  Administered 2021-03-12: 45.045 ug/kg/min via INTRAVENOUS
  Administered 2021-03-12: 45 ug/kg/min via INTRAVENOUS
  Administered 2021-03-12 (×3): 40 ug/kg/min via INTRAVENOUS
  Administered 2021-03-13 (×2): 35 ug/kg/min via INTRAVENOUS
  Filled 2021-03-10 (×9): qty 100
  Filled 2021-03-10: qty 200
  Filled 2021-03-10 (×2): qty 100

## 2021-03-10 MED ORDER — MIDAZOLAM HCL 2 MG/2ML IJ SOLN
5.0000 mg | Freq: Once | INTRAMUSCULAR | Status: AC
  Administered 2021-03-10: 5 mg via INTRAVENOUS

## 2021-03-10 MED ORDER — MIDAZOLAM HCL 2 MG/2ML IJ SOLN
INTRAMUSCULAR | Status: AC
  Filled 2021-03-10: qty 6

## 2021-03-10 MED ORDER — SODIUM CHLORIDE 0.9 % IV SOLN
0.5000 mg/kg/h | INTRAVENOUS | Status: DC
  Administered 2021-03-10: 0.5 mg/kg/h via INTRAVENOUS
  Filled 2021-03-10: qty 5

## 2021-03-10 MED ORDER — FLUCONAZOLE IN SODIUM CHLORIDE 200-0.9 MG/100ML-% IV SOLN
200.0000 mg | INTRAVENOUS | Status: DC
  Administered 2021-03-11 – 2021-03-12 (×3): 200 mg via INTRAVENOUS
  Filled 2021-03-10 (×3): qty 100

## 2021-03-10 MED ORDER — CISATRACURIUM BESYLATE 20 MG/10ML IV SOLN
0.1000 mg/kg | Freq: Once | INTRAVENOUS | Status: AC
  Administered 2021-03-10: 9.2 mg via INTRAVENOUS
  Filled 2021-03-10: qty 10

## 2021-03-10 NOTE — Consult Note (Signed)
Consulted by ICU physician for potential ETT cuff leak, migrated ETT. Upon entry to ICU room, patient intubated, on ventilator with ICU RN and RT at bedside, with MD and RN on TV monitor. MD ordered CXR. Bilateral breath sounds present with expiratory wheezing noted, but no noticeable ETT cuff leak, by CRNA. CXR obtained and results read by physician. Patient easy to ventilate with Ambu bag. Patient on 100% O2 during consult, in case patient needs to be emergently reintubated. Emergency equipment available. Decision by physician to advance ETT approx 2 cm. Please see notes by RT for actual numbers/cm depth. ICU RN giving sedation and paralytic, ETT advanced and resecured by RT with assistance by CRNA if needed. Auscultation immediately after these interventions yielded bilateral clear breath sounds. Physician to obtain new CXR. Physician asked if he needed further assistance by CRNA, and his answer was no. This was a successful and uneventful consult.

## 2021-03-10 NOTE — Progress Notes (Signed)
Nutrition Follow-up  DOCUMENTATION CODES:   Not applicable  INTERVENTION:  - will adjust TF regimen: Pivot 1.5 @ 60 ml/hr which will provide 2160 kcal (97% kcal need), 135 grams protein, and 1080 ml free water. - free water flush to continue to be per CCM.   NUTRITION DIAGNOSIS:   Inadequate oral intake related to inability to eat as evidenced by NPO status.-ongoing  GOAL:   Patient will meet greater than or equal to 90% of their needs -met with TF regimen  MONITOR:   Vent status, TF tolerance, Labs, Weight trends, Skin  ASSESSMENT:   45 year-old male with medical history of recently diagnosed HIV/AIDS and non-compliant with HAART, DM, asthma, HLD, HTN, gout, and obesity. He presented to the ED d/t shortness of breath, peripheral edema, and productive (tan sputum) cough.  Significant Events: 6/30- admission 7/1- intubated; OGT placed 7/2- HD cath placed in R IJ; CRRT initiation 7/3- TF initiation per protocol 7/4- initial RD assessment; TF switched to Pivot 1.5   Patient was discussed in rounds this AM.   He remains intubated with OGT in place and is receiving TF at goal: Pivot 1.5 @ 50 ml/hr with 90 ml Prosource TF x5/day and 20 ml free water every 4 hours. This regimen is providing 2200 kcal, 222 grams protein, and 1020 ml free water.   CRRT machine became clotted earlier and plan at this time is to not re-start CRRT. Re-estimated needs d/t this.   Weight is -5 lb from yesterday. Mild pitting edema to RUE and BLE and deep pitting edema LUE.   Per notes: - HIV/AIDS with medication non-compliance PTA - PNA - ARF on stage 3 CKD    Patient is currently intubated on ventilator support MV: 11.9 L/min Temp (24hrs), Avg:99.4 F (37.4 C), Min:98.96 F (37.2 C), Max:100.04 F (37.8 C) Propofol: none BP: 135/80 and MAP: 95  Labs reviewed; CBGs: 216, 219, 183 mg/dl, BUN: 42 mg/dl, creatinine: 1.58 mg/dl, Ca: 8.2 mg/dl, GFR: 55 ml/min.  Medications reviewed; 120 mg IV  lasix BID, sliding scale novolog. Drips; precedex @ 1 mcg/kg/hr, versed @ 6 mg/hr, fentanyl @ 400 mcg/hr.    Diet Order:   Diet Order             Diet NPO time specified  Diet effective now                   EDUCATION NEEDS:   No education needs have been identified at this time  Skin:  Skin Assessment: Skin Integrity Issues: Skin Integrity Issues:: Stage II, Other (Comment) Stage II: bilateral buttocks Other: MASD coccyx  Last BM:  7/6 (type 7 x1)  Height:   Ht Readings from Last 1 Encounters:  03/05/21 5' 11"  (1.803 m)    Weight:   Wt Readings from Last 1 Encounters:  03/10/21 92.5 kg      Estimated Nutritional Needs:  Kcal:  2235 kcal Protein:  115-140 grams Fluid:  >/= 2 L/day      Jarome Matin, MS, RD, LDN, CNSC Inpatient Clinical Dietitian RD pager # available in [place]  After hours/weekend pager # available in [facility]

## 2021-03-10 NOTE — Progress Notes (Signed)
[facility] for Infectious Disease    Date of Admission:  03/04/2021   Total days of antibiotics 7           ID: Bruce Hunter is a 45 y.o. 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 Off of CRRT.   Medications:   alteplase  2 mg Intracatheter Once   alteplase  2 mg Intracatheter Once   chlorhexidine gluconate (MEDLINE KIT)  15 mL Mouth Rinse BID   Chlorhexidine Gluconate Cloth  6 each Topical Daily   dapsone  100 mg Per Tube Daily   dolutegravir  50 mg Per Tube Daily   feeding supplement (PIVOT 1.5 CAL)  1,000 mL Per Tube Q24H   gabapentin  300 mg Per Tube Q8H   heparin  5,000 Units Subcutaneous Q8H   insulin aspart  0-15 Units Subcutaneous Q4H   ipratropium-albuterol  3 mL Nebulization Q4H   lamiVUDine  150 mg Per Tube Daily   mouth rinse  15 mL Mouth Rinse 10 times per day   pantoprazole sodium  40 mg Per Tube QHS   sodium chloride flush  10-40 mL Intracatheter Q12H    Objective: Vital signs in last 24 hours: Temp:  [99.14 F (37.3 C)-100.76 F (38.2 C)] 100.76 F (38.2 C) (07/06 1730) Pulse Rate:  [83-137] 90 (07/06 1730) Resp:  [14-37] 22 (07/06 1730) BP: (109-155)/(59-123) 130/77 (07/06 1700) SpO2:  [79 %-98 %] 93 % (07/06 1730) FiO2 (%):  [40 %-50 %] 40 % (07/06 1600) Weight:  [92.5 kg] 92.5 kg (07/06 0500) Physical Exam  Constitutional: sedated. He appears well-developed and well-nourished. No distress.  HENT:   Mouth/Throat: OETT in place Cardiovascular: tachycardic, regular rhythm and normal heart sounds. Exam reveals no gallop and no friction rub.  No murmur heard.  Pulmonary/Chest: Effort normal and breath sounds -rhonchi. No respiratory distress. He has no wheezes.  Abdominal: Soft. Bowel sounds are decreased. He exhibits mild distention. There is no tenderness.  Skin: Skin is warm and dry. No rash noted. No erythema.     Lab Results Recent Labs    03/07/21 2150 03/08/21 0613 03/08/21 1612 03/09/21 1637 03/10/21 0408  WBC  --  11.2*  --   --   --   HGB 6.2* 7.4*  --   --   --   HCT 20.9* 24.5*  --   --   --   NA  --  135   < > 137 137  K  --  5.0   < > 4.1 4.3  CL  --  103   < > 105 103  CO2  --  24   < > 25 26  BUN  --  51*   < > 43* 42*  CREATININE  --  2.51*   < > 1.69* 1.58*   < > = values in this interval not displayed.   Liver Panel Recent Labs    03/09/21  1637 03/10/21 0408  ALBUMIN 2.2* 2.3*   Sedimentation Rate No results for input(s): ESRSEDRATE in the last 72 hours. C-Reactive Protein No results for input(s): CRP in the last 72 hours.  Microbiology: reviewed Studies/Results: DG Abd 1 View  Result Date: 03/09/2021 CLINICAL DATA:  NG tube adjustment. EXAM: ABDOMEN - 1 VIEW COMPARISON:  03/05/2021. FINDINGS: NG tube noted with its tip coiled over the distal stomach. No gastric or bowel distention noted. No free air noted. Bilateral pulmonary infiltrates. IMPRESSION: NG tube noted with its tip coiled over the distal stomach. Electronically Signed   By: Marcello Moores  Register   On: 03/09/2021 09:01   DG Chest Port 1 View  Result Date: 03/09/2021 CLINICAL DATA:  Respiratory failure. EXAM: PORTABLE CHEST 1 VIEW COMPARISON:  03/07/2021. FINDINGS: Endotracheal tube, NG tube, right IJ line in stable position. Heart size stable. Multifocal bilateral pulmonary infiltrates/edema again noted without interim change. Low lung volumes. No pleural effusion or pneumothorax.  IMPRESSION: 1.  Lines and tubes in stable position. 2. Multifocal bilateral pulmonary infiltrates/edema again noted without interim change. Electronically Signed   By: Marcello Moores  Register   On: 03/09/2021 05:45   VAS [place] UPPER EXTREMITY VENOUS DUPLEX  Result Date: 03/09/2021 UPPER VENOUS STUDY  Patient Name:  Bruce Hunter  Date of Exam:   03/09/2021 Medical Rec #: [id number]       Accession #:    [id number] Date of Birth: [birth year]       Patient Gender: M Patient Age:   89F Exam Location:  [facility] Procedure:      VAS [place] UPPER EXTREMITY VENOUS DUPLEX Referring Phys: [id number] MICHAEL B WERT --------------------------------------------------------------------------------  Indications: Swelling Other Indications: Previous IV placement to LUE. Limitations: Patient movement (AMS). Comparison Study: No previous exams Performing Technologist: Jody Hill RVT, RDMS  Examination Guidelines: A complete evaluation includes B-mode imaging, spectral Doppler, color Doppler, and power Doppler as needed of all accessible portions of each vessel. Bilateral testing is considered an integral part of a complete examination. Limited examinations for reoccurring indications may be performed as noted.  Right Findings: Patient coughing and being suctioned from right side - could not image RUE subclavian  Left Findings: +----------+------------+---------+-----------+----------+-------------------+ LEFT      CompressiblePhasicitySpontaneousProperties      Summary       +----------+------------+---------+-----------+----------+-------------------+ IJV           Full       Yes       Yes                                  +----------+------------+---------+-----------+----------+-------------------+ Subclavian    Full       Yes       Yes                                  +----------+------------+---------+-----------+----------+-------------------+ Axillary      Full       Yes       Yes                                   +----------+------------+---------+-----------+----------+-------------------+ Brachial      Full       Yes       Yes                                  +----------+------------+---------+-----------+----------+-------------------+  Radial        Full                                                      +----------+------------+---------+-----------+----------+-------------------+ Ulnar         Full                                  Not well visualized +----------+------------+---------+-----------+----------+-------------------+ Cephalic      None       No        No                      Acute        +----------+------------+---------+-----------+----------+-------------------+ Basilic       Full       Yes       Yes                                  +----------+------------+---------+-----------+----------+-------------------+  Summary:  Left: No evidence of deep vein thrombosis in the upper extremity. Findings consistent with acute superficial vein thrombosis involving the left cephalic vein.  *See table(s) above for measurements and observations.  Diagnosing physician: Ruta Hinds MD Electronically signed by Ruta Hinds MD on 03/09/2021 at 3:48:56 PM.    Final      Assessment/Plan: Advanced hiv disease = currently on lamivudine and tivicay - plan to switch to tivicay and descovy on renal function slightly more improved.   MSSA pneumonia with ARDS/acute respiratory distress with hypoxemia= continue on ventilator support per PCCM and cefazolin.  AKI from ATN from sepsis = continue with renal dosing of abtx, and diuresis per renal. For now monitor off of CRRT  Oi proph = continue on dapsone  [facility] for Infectious Diseases Cell: [phone] Pager: [phone]  03/10/2021, 5:32 PM

## 2021-03-10 NOTE — Progress Notes (Signed)
Called to bedside for agitated delirium despite patient being on versed, fentanyl and precedex. Based on chart, patient has been weaning his vent and mental status has been barrier to extubation.  Otherwise off pressors and CRRT.  Ketamine started earlier tonight.   Nursing also concerned for seizure after he spiked a fever because patient's eyes rolled into back of his head.   On exam appears critically ill and non responsive, double triggering the ventilator. Recently received dose of nimbex through PIV.   Repeat labs just sent. Will await labs.  In the meantime need to simplify his sedation regimen. He has been on fentanyl and versed for days. This will take a while to clear. Would stop precedex for now. He is nowhere near extubation while still on versed. Start propofol. Stop ketamine once this is started. Instructed nurse to wean versed once ketamine is off. Goal is to be on fentanyl and propofol.  Lenice Llamas, MD Pulmonary and [place]

## 2021-03-10 NOTE — Progress Notes (Signed)
ETT advanced 2cm from 27 to 29cm at the lip. Equal bilateral breaths present. No complications noted at this time.

## 2021-03-10 NOTE — Progress Notes (Addendum)
eLink Physician-Brief Progress Note Patient Name: Bruce Hunter DOB: April 07, 1976 MRN: [id number]   Date of Service  03/10/2021  HPI/Events of Note  Patient with difficult ventilation, desaturation and audible upper airway sounds in the context of patient becoming extremely agitated, the concern was that he had self extubated with the ET tube above the cords, however stat portable CXR showed the ET tube high in the airway with the balloon likely stenting the cords open, the ET tube was advanced 2 cm with resolution of the issue, Versed 5 mg + Nimbex 0.I mg / kg was given and a Ketamine gtt started, plan is to wean off Precedex and sedate with Ketamine + Versed + Fentanyl.  eICU Interventions  See above. Patient needs a central line for medication access. ABG ordered , as well as post adjustment CXR.        Kerry Kass Emelie Newsom 03/10/2021, 9:52 PM

## 2021-03-10 NOTE — Progress Notes (Signed)
PHARMACY NOTE:  ANTIMICROBIAL RENAL DOSAGE ADJUSTMENT  Current antimicrobial regimen includes a mismatch between antimicrobial dosage and estimated renal function.  As per policy approved by the Pharmacy & Therapeutics and Medical Executive Committees, the antimicrobial dosage will be adjusted accordingly.  Current antimicrobial dosage:  Ancef 2gm q12 for CRRT dosing                                                      Fluconazole '400mg'$  q24 for CRRT dosing  Indication: MSSA per Trach aspirate, Esophageal candidiasis  Renal Function:   Estimated Creatinine Clearance: 69.4 mL/min (A) (by C-G formula based on SCr of 1.58 mg/dL (H)). '[]'$      On intermittent HD, scheduled: '[]'$      On CRRT    Antimicrobial dosage has been changed to:  Ancef 2gm q8                                                                             Fluconazole '200mg'$  IV q24  Additional Comments: off CRRT, abx adjusted for current renal fx.   Thank you for allowing pharmacy to be a part of this patient's care.  Minda Ditto PharmD 03/10/2021 1:34 PM

## 2021-03-10 NOTE — Progress Notes (Addendum)
NAME:  Bruce Hunter, MRN:  [id number], DOB:  04/16/1976, LOS: 6 ADMISSION DATE:  03/04/2021, CONSULTATION DATE:  03/04/21 REFERRING MD:  Melina Copa, CHIEF COMPLAINT:  Shortness of breath.     Brief Narrative:  45 yo man with HIV/AIDS here with acute hypoxemic respiratory failure.  Complaints of SOB and cough productive of tan sputum.  Has had respiratory symptoms since he was discharged earlier this month (6/13).     Recently diagnosed with HIV/AIDS, started on medications, but patient tells me he "hasn't had time to start taking them" but he isn't really able to explain further.  Patient was initiated on lamivudine, Tivicay, dapsone and fluconazole on discharge.  Patient does have an appointment with infectious disease as outpatient.  Candida esophagitis. At the time.    ID note 6/17:  DTG plus lamivudine. He can get the combined pill, dovato, upon discharge. Can send rx for dovato for free 30 day supply Esophageal candidiasis = recommend to treat with fluconazole 413m po daily x 14-21 days Opportunistic infection proph = continue on dapsone 1032mdaily  Pertinent  Medical History  Asthma DM2 Gout HTN Obesity  GERD AKI Neuropathy  Significant Hospital Events:  6/30 admitted for PNA with known HIV/AIDS 7/1 ET >>> Echo 7/1 mild  RA dilation with RV low nl fxn mild PAS elevation and nl LV 7/2 HD catheter >>> 7/5, Fi O2 50%, Pneumocystis DFA negative 03/06/21 ([facility]), MSSA on LRCx LUE ultrasound 7/5 > No DVT, acute superficial vein thrombosis L cephalic vein.     Scheduled Meds:  alteplase  2 mg Intracatheter Once   alteplase  2 mg Intracatheter Once   chlorhexidine gluconate (MEDLINE KIT)  15 mL Mouth Rinse BID   Chlorhexidine Gluconate Cloth  6 each Topical Daily   dapsone  100 mg Per Tube Daily   dolutegravir  50 mg Per Tube Daily   feeding supplement (PIVOT 1.5 CAL)  1,000 mL Per Tube Q24H   feeding supplement (PROSource TF)  90 mL Per Tube 5 X Daily   gabapentin  300  mg Per Tube Q8H   heparin  5,000 Units Subcutaneous Q8H   insulin aspart  0-15 Units Subcutaneous Q4H   ipratropium-albuterol  3 mL Nebulization Q4H   lamiVUDine  150 mg Per Tube Daily   mouth rinse  15 mL Mouth Rinse 10 times per day   methylPREDNISolone (SOLU-MEDROL) injection  40 mg Intravenous Q24H   pantoprazole sodium  40 mg Per Tube QHS   sodium chloride flush  10-40 mL Intracatheter Q12H   Continuous Infusions:  sodium chloride 10 mL/hr at 03/10/21 0111   sodium chloride 10 mL/hr at 03/10/21 0600    ceFAZolin (ANCEF) IV Stopped (03/09/21 2204)   dexmedetomidine (PRECEDEX) IV infusion 1 mcg/kg/hr (03/10/21 0615)   fentaNYL infusion INTRAVENOUS 400 mcg/hr (03/10/21 0600)   fluconazole (DIFLUCAN) IV Stopped (03/10/21 0014)   furosemide Stopped (03/09/21 1913)   midazolam 8 mg/hr (03/10/21 0616)   norepinephrine (LEVOPHED) Adult infusion Stopped (03/08/21 2230)   prismasol BGK 2/2.5 replacement solution 400 mL/hr at 03/09/21 2028   prismasol BGK 2/2.5 replacement solution 200 mL/hr at 03/09/21 1633   prismasol BGK 4/2.5 1,800 mL/hr at 03/10/21 0631   PRN Meds:.acetaminophen, albuterol, docusate, fentaNYL, guaiFENesin, heparin, midazolam, ondansetron (ZOFRAN) IV, polyethylene glycol, sodium chloride, sodium chloride flush     Interim History / Subjective:  Agitation last night requiring increasing sedation and neuromuscular blockade x 1 dose.  Diarrhea overnight as well.   Objective  Blood pressure 122/76, pulse 92, temperature 99.5 F (37.5 C), resp. rate 18, height $RemoveBe'5\' 11"'uJyAAOFDf$  (1.803 m), weight 92.5 kg, SpO2 96 %. CVP:  [8 mmHg] 8 mmHg  Vent Mode: PRVC FiO2 (%):  [40 %-50 %] 40 % Set Rate:  [20 bmp] 20 bmp Vt Set:  [550 mL] 550 mL PEEP:  [8 cmH20] 8 cmH20 Pressure Support:  [8 cmH20] 8 cmH20 Plateau Pressure:  [17 cmH20-26 cmH20] 17 cmH20   Intake/Output Summary (Last 24 hours) at 03/10/2021 0727 Last data filed at 03/10/2021 0700 Gross per 24 hour  Intake 3935.18 ml   Output 5683.6 ml  Net -1748.42 ml    Filed Weights   03/08/21 0357 03/09/21 0433 03/10/21 0500  Weight: 95.7 kg 95 kg 92.5 kg   CVP:  [8 mmHg] 8 mmHg    Examination:  General:  middle aged male on vent Neuro:  Sedated HEENT:  [place]/AT, No JVD noted, PERRL Cardiovascular:  RRR, no MRG Lungs:  Clear bilateral breath sounds Abdomen:  Soft, non-distended Musculoskeletal:  No acute deformity Skin:  Intact, MMM    Resolved Hospital Problem list   Severe sepsis/Septic shock due to pneumonia  Assessment & Plan:   Acute hypoxemic respiratory failure due to pneumonia: AIDS, broad differential. DFA negative, MSSA on Sputum Cx. - Weaning FiO2. Now down to 40% - VAP bundle - Likely ready for SBT from pulmonary standpoint, but now delirium is becoming an issue.  - Precedex, Fentanyl, Versed infusions for RASS goal -1 to -2 - Will check QT and add anti-psychotic medication if possible in attempt to wean high dose drips.   PNA, immunocompromised in the setting of HIV/AIDS: MSSA on culture. DFA negative - Appreciate ID - Narrowed to Cefazolin - DC methylprednisolone post 5 day course.   AIDS/HIV: Low CD 4, HIV viral load >1.47 mil - ID following / rx per ID. Lamivudine and tivicay.    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 1.4L UOP 7/4 - CRRT per nephrology  LE edema, improved: Due to Hypoalbuminemia  - Monitor  Elevated LFTs : Elevated AP, AST 70, ALT 32. NAFLD possible. - Viral hepatis panel negative - LFT trending down. Repeat.  Hx of HTN: Home medications include Norvasc, HCTZ, Lisinopril,  - Holding  home meds   Type 2 diabetes Neuropathic pain: in feet --SSI increased to moderate ssi 7/2  > better 7/3  --CBG checks q4  Anemia: Due to chronic disease, inflammation, AIDS.  --Transfuse Hgb < 7    Best Practice    Diet/type: tf per nutrition  DVT prophylaxis: prophylactic heparin  GI prophylaxis: PPI Lines:  HD catheter Foley:  N/A Code  Status:  full code Last date of multidisciplinary goals of care discussion  Updated gf at bedside  Critical care time 40 minutes   Bruce Hunter, AGACNP-BC [place] for personal pager PCCM on call pager [phone] until 7pm. Please call Elink 7p-7a. [phone]  03/10/2021 7:55 AM   Critical care attending attestation note:  Patient seen and examined and relevant ancillary tests reviewed.  I agree with the assessment and plan of care as outlined by Bruce Housekeeper, NP.   HIV/AIDS with respiratory failure. MSSA on LRCx. Pneumocystis DFA negative. Improving ,weaning vent. PSV today. Mental status precludes extubation. Agitation with increasing sedatives overnight main culprit.  Synopsis of assessment and plan:  Acute Hypoxemic Respiratory Failure due to ARDS from MSSA pneumonia: --cefazolin --PSV, Vt 800 cc  on 5/5, PRVC overnight --Vap bundle --Stop solumedrol (received 5 days)  Toxic metabolic encephalopathy: high sedative needs, agitation. --Wean fentanyl and versed, mental status precludes extubation --Precedex to help aid in agitation and get to extubation --SBT in AM  Acute renal failure: ATN in setting of septic shock (resolved). On CRRT, circuit clotted. Dialysis holiday, assess response. UOP good. --dialysis, lasix per nephro  AIDS: --therapy per ID  CRITICAL CARE Performed by: Bruce Hunter   Total critical care time: 32 minutes  Critical care time was exclusive of separately billable procedures and treating other patients.  Critical care was necessary to treat or prevent imminent or life-threatening deterioration.  Critical care was time spent personally by me on the following activities: development of treatment plan with patient and/or surrogate as well as nursing, discussions with consultants, evaluation of patient's response to treatment, examination of patient, obtaining history from patient or surrogate,  ordering and performing treatments and interventions, ordering and review of laboratory studies, ordering and review of radiographic studies, pulse oximetry, re-evaluation of patient's condition and participation in multidisciplinary rounds.  Bruce Brood, MD [facility] ICU Physician [place]  Pager: [phone] Mobile: [phone] After hours: [phone].  03/10/2021, 3:46 PM

## 2021-03-10 NOTE — Progress Notes (Signed)
Nephrology Follow-Up Consult note   Assessment/Recommendations: Bruce Hunter is a/an 45 y.o. male with a past medical history significant for HIV/AIDS, CKD, asthma, obesity, DM2 admitted for acute hypoxic respiratory failure with sepsis.      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  CKD with baseline of 2.5-3.  Did have some urinary retention now with Foley catheter.  Urine output is improving. -Hold CRRT -Monitor for renal recovery -Agree with Foley catheter given urinary retention -Continue IV Lasix 120 mg twice daily to assist with volume removal  -Continue to monitor daily Cr, Dose meds for GFR -Monitor Daily I/Os, Daily weight  -Maintain MAP>65 for optimal renal perfusion.  -Avoid nephrotoxic medications including NSAIDs and Vanc/Zosyn combo -Continue treating sepsis as below  Acute hypoxic respiratory failure: Remains ventilated.  FiO2 improved today.  Off CRRT but managing Lasix as above.  Management per primary  Septic shock: Antibiotics per primary team.  Shock significantly improved now off pressors.  Pneumonia: Concern for PJP.  Antibiotics and management per primary  HIV: ID managing  Hypertension: History of such.  Holding home blood pressure medications  DM2: Insulin management per primary  Anemia: Likely multifactorial with infection contributing.  Transfuse for hemoglobin less than 7 as needed  Recommendations conveyed to primary service.    [facility] 03/10/2021 12:48 PM  ___________________________________________________________  CC: Acute renal failure  Interval History/Subjective: Patient remained off pressors.  Good urine output with 1.5 L made yesterday.  -1.8 L total including ultrafiltration.  Ventilatory requirements improved today.  CRRT machine clotted around 1030 this morning.  Minimal blood loss.  Urine output has been good today.   Medications:  Current  Facility-Administered Medications  Medication Dose Route Frequency Provider Last Rate Last Admin   0.9 %  sodium chloride infusion  250 mL Intravenous Continuous Candee Furbish, MD 10 mL/hr at 03/10/21 0111 Infusion Verify at 03/10/21 0111   0.9 %  sodium chloride infusion  250 mL Intravenous Continuous Anders Simmonds, MD 10 mL/hr at 03/10/21 1213 Infusion Verify at 03/10/21 1213   acetaminophen (TYLENOL) 160 MG/5ML solution 650 mg  650 mg Per Tube Q6H PRN Tanda Rockers, MD       albuterol (PROVENTIL) (2.5 MG/3ML) 0.083% nebulizer solution 2.5 mg  2.5 mg Nebulization Q2H PRN Collier Bullock, MD       alteplase (CATHFLO ACTIVASE) injection 2 mg  2 mg Intracatheter Once Tanda Rockers, MD       alteplase (CATHFLO ACTIVASE) injection 2 mg  2 mg Intracatheter Once Roney Jaffe, MD       ceFAZolin (ANCEF) IVPB 2g/100 mL premix  2 g Intravenous Q12H Carlyle Basques, MD   Stopped at 03/10/21 1042   chlorhexidine gluconate (MEDLINE KIT) (PERIDEX) 0.12 % solution 15 mL  15 mL Mouth Rinse BID Candee Furbish, MD   15 mL at 03/10/21 0354   Chlorhexidine Gluconate Cloth 2 % PADS 6 each  6 each Topical Daily Candee Furbish, MD   6 each at 03/09/21 2050   dapsone tablet 100 mg  100 mg Per Tube Daily Carlyle Basques, MD   100 mg at 03/10/21 1012   dexmedetomidine (PRECEDEX) 200 MCG/50ML (4 mcg/mL) infusion  0.4-1.2 mcg/kg/hr Intravenous Titrated Frederik Pear, MD 23.8 mL/hr at 03/10/21 1213 1 mcg/kg/hr at 03/10/21 1213   docusate (COLACE) 50 MG/5ML liquid 100 mg  100 mg Per Tube BID PRN Tanda Rockers, MD  100 mg at 03/08/21 1358   dolutegravir (TIVICAY) tablet 50 mg  50 mg Per Tube Daily Tanda Rockers, MD   50 mg at 03/10/21 1012   feeding supplement (PIVOT 1.5 CAL) liquid 1,000 mL  1,000 mL Per Tube Q24H Tanda Rockers, MD 50 mL/hr at 03/10/21 1000 Infusion Verify at 03/10/21 1000   feeding supplement (PROSource TF) liquid 90 mL  90 mL Per Tube 5 X Daily Tanda Rockers, MD   90 mL at 03/10/21  1012   fentaNYL (SUBLIMAZE) bolus via infusion 200 mcg  200 mcg Intravenous Q1H PRN Hunsucker, Bonna Gains, MD   200 mcg at 03/10/21 1043   fentaNYL 2519mg in NS 2560m(1048mml) infusion-PREMIX  0-400 mcg/hr Intravenous Continuous IzqTyna JakschD 40 mL/hr at 03/10/21 1213 400 mcg/hr at 03/10/21 1213   fluconazole (DIFLUCAN) IVPB 400 mg  400 mg Intravenous Q24H BelEudelia BunchPH   Stopped at 03/10/21 0014   furosemide (LASIX) 120 mg in dextrose 5 % 50 mL IVPB  120 mg Intravenous BID PeeReesa ChewD   Stopped at 03/10/21 [id number] (NEURONTIN) 250 MG/5ML solution 300 mg  300 mg Per Tube Q8H WerTanda RockersD   300 mg at 03/10/21 0616   guaiFENesin (ROBITUSSIN) 100 MG/5ML solution 100 mg  5 mL Per Tube Q4H PRN WerTanda RockersD       heparin injection 1,000-6,000 Units  1,000-6,000 Units CRRT PRN SchRoney JaffeD   3,000 Units at 03/10/21 1058   heparin injection 5,000 Units  5,000 Units Subcutaneous Q8H GonCollier BullockD   5,000 Units at 03/10/21 0616   insulin aspart (novoLOG) injection 0-15 Units  0-15 Units Subcutaneous Q4H Ollis, Brandi L, NP   3 Units at 03/10/21 1241   ipratropium-albuterol (DUONEB) 0.5-2.5 (3) MG/3ML nebulizer solution 3 mL  3 mL Nebulization Q4H IzqTyna JakschD   3 mL at 03/10/21 1121   lamiVUDine (EPIVIR) tablet 150 mg  150 mg Per Tube Daily WerTanda RockersD   150 mg at 03/10/21 1012   MEDLINE mouth rinse  15 mL Mouth Rinse 10 times per day SmiCandee FurbishD   15 mL at 03/10/21 1155   midazolam (VERSED) 50 mg/50 mL (1 mg/mL) premix infusion  1-10 mg/hr Intravenous Continuous WerTanda RockersD 7 mL/hr at 03/10/21 1213 7 mg/hr at 03/10/21 1213   midazolam (VERSED) injection 4 mg  4 mg Intravenous Q2H PRN Hunsucker, MatBonna GainsD   4 mg at 03/10/21 1043   ondansetron (ZOFRAN) injection 4 mg  4 mg Intravenous Q6H PRN GonCollier BullockD       pantoprazole sodium (PROTONIX) 40 mg/20 mL oral suspension 40 mg  40 mg Per Tube QHS  BelLeodis Sias RPH   40 mg at 03/09/21 2140   polyethylene glycol (MIRALAX / GLYCOLAX) packet 17 g  17 g Per Tube Daily PRN WerTanda RockersD       prismasol BGK 2/2.5 replacement solution   CRRT Continuous SchRoney JaffeD   Stopped at 03/10/21 1026   prismasol BGK 2/2.5 replacement solution   CRRT Continuous SchRoney JaffeD   Stopped at 03/10/21 1026   prismasol BGK 4/2.5 infusion   CRRT Continuous SchRoney JaffeD   Stopped at 03/10/21 1026   sodium chloride 0.9 % primer fluid for CRRT   CRRT PRN SchRoney JaffeD       sodium chloride flush (NS) 0.9 %  injection 10-40 mL  10-40 mL Intracatheter Q12H Tanda Rockers, MD   10 mL at 03/10/21 1018   sodium chloride flush (NS) 0.9 % injection 10-40 mL  10-40 mL Intracatheter PRN Tanda Rockers, MD          Review of Systems: Unable to obtain due to the patient's sedation  Physical Exam: Vitals:   03/10/21 1200 03/10/21 1230  BP: 124/69 123/70  Pulse: 97 94  Resp: 17 17  Temp: 99.68 F (37.6 C) 99.68 F (37.6 C)  SpO2: 95% 94%   Total I/O In: 939.4 [I.V.:507.4; NG/GT:270; IV Piggyback:162] Out: 1394.5 [Urine:755; Other:639.5]  Intake/Output Summary (Last 24 hours) at 03/10/2021 1248 Last data filed at 03/10/2021 1213 Gross per 24 hour  Intake 4356.68 ml  Output 6262.2 ml  Net -1905.52 ml   Constitutional: Ill-appearing, intubated and sedated ENMT: ears and nose without scars or lesions, MMM CV: Tachycardic, minimal edema in lower extremities Respiratory: Coarse bilateral breath sounds but improved overall, ventilated, bilateral chest rise Gastrointestinal: soft, non-tender, no palpable masses or hernias Skin: no visible lesions or rashes Psych: Sedated, does not respond to questioning   Test Results I personally reviewed new and old clinical labs and radiology tests Lab Results  Component Value Date   NA 137 03/10/2021   K 4.3 03/10/2021   CL 103 03/10/2021   CO2 26 03/10/2021   BUN 42 (H) 03/10/2021    CREATININE 1.58 (H) 03/10/2021   CALCIUM 8.2 (L) 03/10/2021   ALBUMIN 2.3 (L) 03/10/2021   PHOS 2.9 03/10/2021

## 2021-03-10 NOTE — Progress Notes (Signed)
The CRRT filter pressure began to rapidly rise around 1000. Charge RN, Pamala Hurry,  assessed lines and filter with this RN. When repositioning and blood flow titration was ineffective, blood return was started. Only about 30 mLs of blood was able to be returned before filter clotted.   Nephrology was notified, as well as CCM. This RN confirmed with Dr. Joylene Grapes that CRRT was not to be restarted at this time; HD cath was heparin locked per protocol. This RN will continue to carefully monitor pt.

## 2021-03-11 ENCOUNTER — Inpatient Hospital Stay (HOSPITAL_COMMUNITY): Payer: Medicaid Other

## 2021-03-11 DIAGNOSIS — B20 Human immunodeficiency virus [HIV] disease: Secondary | ICD-10-CM | POA: Diagnosis not present

## 2021-03-11 LAB — RENAL FUNCTION PANEL
Albumin: 1.9 g/dL — ABNORMAL LOW (ref 3.5–5.0)
Anion gap: 6 (ref 5–15)
BUN: 62 mg/dL — ABNORMAL HIGH (ref 6–20)
CO2: 28 mmol/L (ref 22–32)
Calcium: 8.4 mg/dL — ABNORMAL LOW (ref 8.9–10.3)
Chloride: 106 mmol/L (ref 98–111)
Creatinine, Ser: 1.9 mg/dL — ABNORMAL HIGH (ref 0.61–1.24)
GFR, Estimated: 44 mL/min — ABNORMAL LOW (ref >60)
Glucose, Bld: 161 mg/dL — ABNORMAL HIGH (ref 70–99)
Phosphorus: 4.8 mg/dL — ABNORMAL HIGH (ref 2.5–4.6)
Potassium: 3.8 mmol/L (ref 3.5–5.1)
Sodium: 140 mmol/L (ref 135–145)

## 2021-03-11 LAB — HEPATIC FUNCTION PANEL
ALT: 17 U/L (ref 0–44)
AST: 23 U/L (ref 15–41)
Albumin: 1.9 g/dL — ABNORMAL LOW (ref 3.5–5.0)
Alkaline Phosphatase: 69 U/L (ref 38–126)
Bilirubin, Direct: 0.1 mg/dL (ref 0.0–0.2)
Indirect Bilirubin: 0.3 mg/dL (ref 0.3–0.9)
Total Bilirubin: 0.4 mg/dL (ref 0.3–1.2)
Total Protein: 6.3 g/dL — ABNORMAL LOW (ref 6.5–8.1)

## 2021-03-11 LAB — BLOOD GAS, ARTERIAL
Acid-base deficit: 0.4 mmol/L (ref 0.0–2.0)
Bicarbonate: 24.3 mmol/L (ref 20.0–28.0)
Drawn by: 560031
FIO2: 60
MECHVT: 550 mL
O2 Saturation: 90.7 %
PEEP: 5 cmH2O
Patient temperature: 98.6
RATE: 20 resp/min
pCO2 arterial: 42.8 mmHg (ref 32.0–48.0)
pH, Arterial: 7.373 (ref 7.350–7.450)
pO2, Arterial: 66.7 mmHg — ABNORMAL LOW (ref 83.0–108.0)

## 2021-03-11 LAB — GLUCOSE, CAPILLARY
Glucose-Capillary: 110 mg/dL — ABNORMAL HIGH (ref 70–99)
Glucose-Capillary: 112 mg/dL — ABNORMAL HIGH (ref 70–99)
Glucose-Capillary: 139 mg/dL — ABNORMAL HIGH (ref 70–99)
Glucose-Capillary: 152 mg/dL — ABNORMAL HIGH (ref 70–99)
Glucose-Capillary: 160 mg/dL — ABNORMAL HIGH (ref 70–99)
Glucose-Capillary: 79 mg/dL (ref 70–99)

## 2021-03-11 LAB — CBC
HCT: 25.7 % — ABNORMAL LOW (ref 39.0–52.0)
Hemoglobin: 7.4 g/dL — ABNORMAL LOW (ref 13.0–17.0)
MCH: 29.8 pg (ref 26.0–34.0)
MCHC: 28.8 g/dL — ABNORMAL LOW (ref 30.0–36.0)
MCV: 103.6 fL — ABNORMAL HIGH (ref 80.0–100.0)
Platelets: 202 10*3/uL (ref 150–400)
RBC: 2.48 MIL/uL — ABNORMAL LOW (ref 4.22–5.81)
RDW: 20.2 % — ABNORMAL HIGH (ref 11.5–15.5)
WBC: 15.1 10*3/uL — ABNORMAL HIGH (ref 4.0–10.5)
nRBC: 0.1 % (ref 0.0–0.2)

## 2021-03-11 LAB — PHOSPHORUS: Phosphorus: 4.7 mg/dL — ABNORMAL HIGH (ref 2.5–4.6)

## 2021-03-11 LAB — FUNGITELL, SERUM: Fungitell Result: >500 pg/mL — ABNORMAL HIGH (ref <80)

## 2021-03-11 LAB — TRIGLYCERIDES: Triglycerides: 149 mg/dL (ref <150)

## 2021-03-11 LAB — MAGNESIUM: Magnesium: 2.2 mg/dL (ref 1.7–2.4)

## 2021-03-11 IMAGING — DX DG CHEST 1V PORT
1 series · 1 of 1 positions shown · non-contrast
Comparison: Portable exam [KZ] hours compared to [DATE]

CLINICAL DATA: HIV/aids, acute hypoxic respiratory failure,
shortness of breath, productive cough with tan sputum

EXAM:
PORTABLE CHEST 1 VIEW

[chest ap]
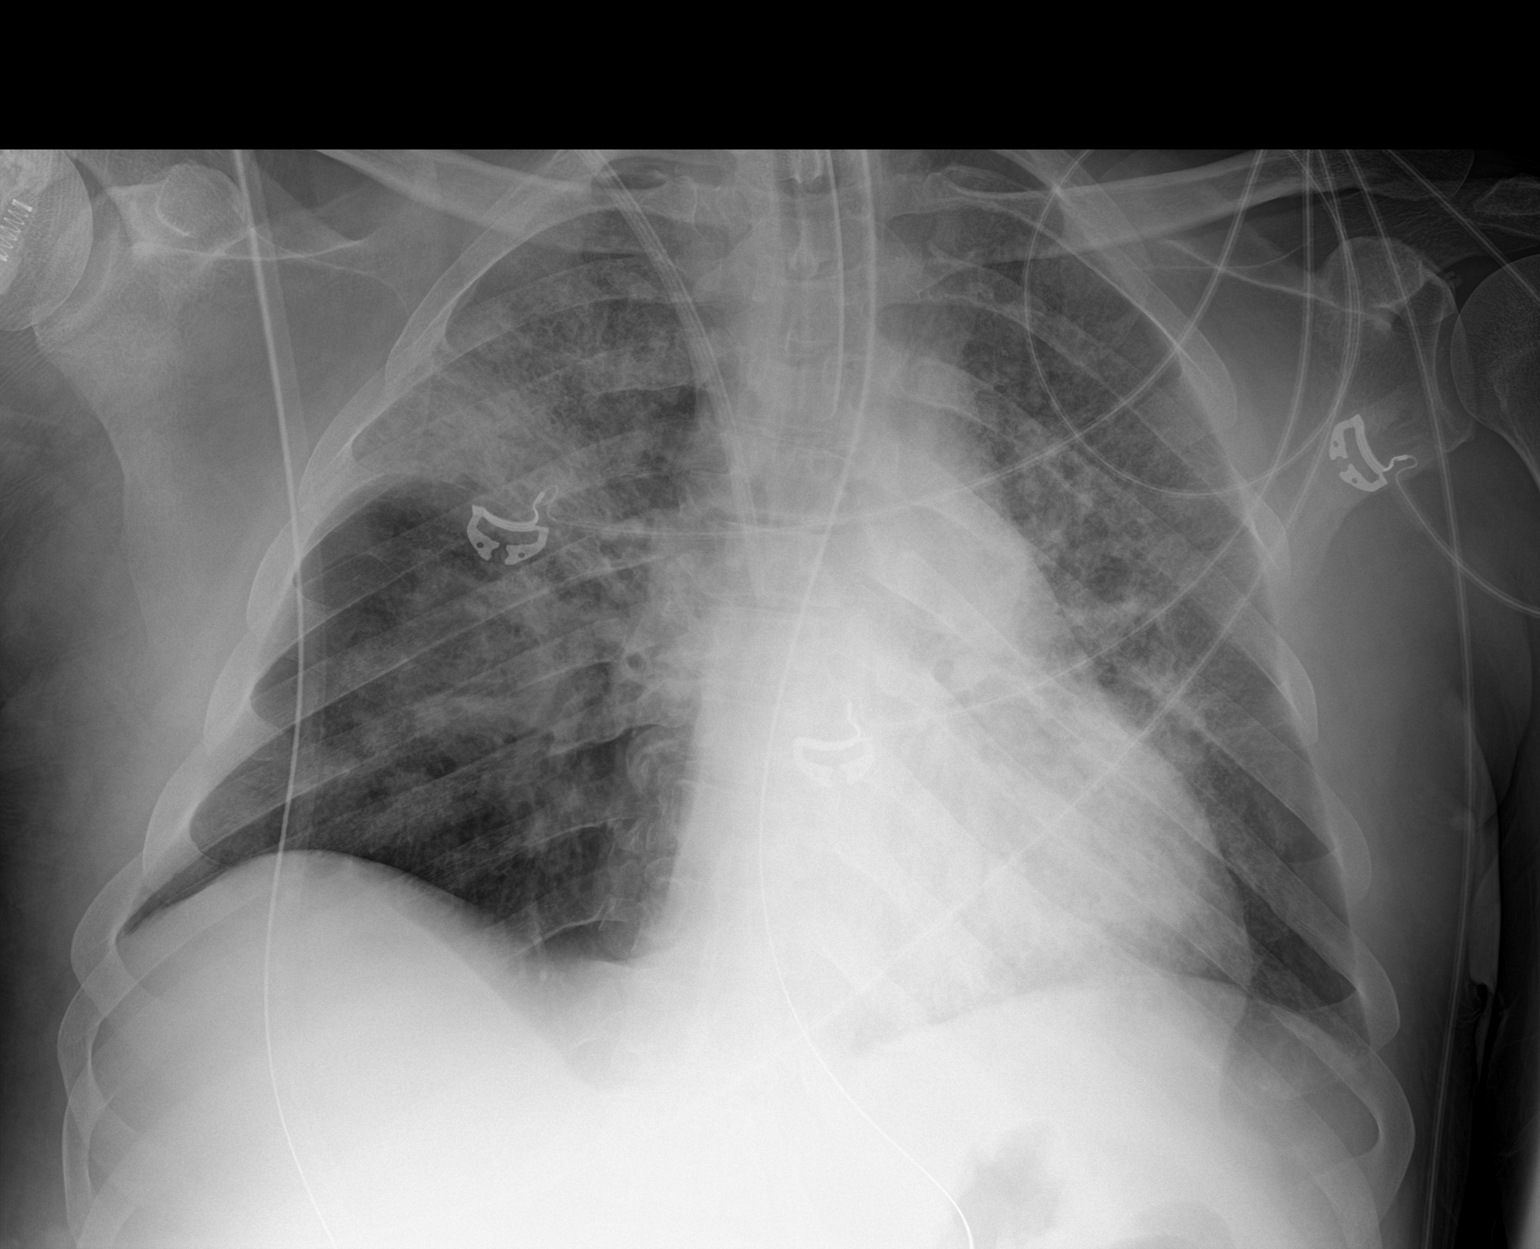

[1 of 1 positions shown; findings below may reference images not displayed]

FINDINGS: Tip of endotracheal tube projects 3.0 cm above carina.

Nasogastric tube extends into stomach.

RIGHT jugular central venous catheter with tip projecting over SVC.

Normal heart size mediastinal contours.

BILATERAL pulmonary infiltrates, greatest in RIGHT upper lobe,
consistent with multifocal pneumonia.

No pleural effusion or pneumothorax.
IMPRESSION: BILATERAL pulmonary infiltrates consistent with multifocal
pneumonia, greatest in RIGHT upper lobe.

Little interval change since prior exam.

## 2021-03-11 MED ORDER — CLONAZEPAM 1 MG PO TABS
1.0000 mg | ORAL_TABLET | Freq: Two times a day (BID) | ORAL | Status: DC
  Administered 2021-03-11: 1 mg via ORAL
  Filled 2021-03-11: qty 1

## 2021-03-11 MED ORDER — OXYCODONE HCL ER 10 MG PO T12A
10.0000 mg | EXTENDED_RELEASE_TABLET | Freq: Two times a day (BID) | ORAL | Status: DC
  Filled 2021-03-11: qty 1

## 2021-03-11 MED ORDER — CLONAZEPAM 1 MG PO TABS: 1.0000 mg | ORAL_TABLET | Freq: Two times a day (BID) | ORAL | Status: DC

## 2021-03-11 MED ORDER — CLONAZEPAM 1 MG PO TABS
1.0000 mg | ORAL_TABLET | Freq: Two times a day (BID) | ORAL | Status: DC
  Administered 2021-03-11 – 2021-03-18 (×15): 1 mg
  Filled 2021-03-11 (×15): qty 1

## 2021-03-11 MED ORDER — CLONAZEPAM 0.1 MG/ML ORAL SUSPENSION: 1.0000 mg | Freq: Two times a day (BID) | ORAL | Status: DC

## 2021-03-11 MED ORDER — OXYCODONE HCL 5 MG/5ML PO SOLN
5.0000 mg | Freq: Four times a day (QID) | ORAL | Status: DC
  Administered 2021-03-11 – 2021-03-19 (×31): 5 mg
  Filled 2021-03-11 (×31): qty 5

## 2021-03-11 MED ORDER — CLONAZEPAM 0.1 MG/ML ORAL SUSPENSION
1.0000 mg | Freq: Two times a day (BID) | ORAL | Status: DC
  Filled 2021-03-11: qty 10

## 2021-03-11 NOTE — Progress Notes (Signed)
Nephrology Follow-Up Consult note   Assessment/Recommendations: Bruce Hunter is a/an 44 y.o. male with a past medical history significant for HIV/AIDS, CKD, asthma, obesity, DM2 admitted for acute hypoxic respiratory failure with sepsis.      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  CKD with baseline of 2.5-3.  Did have some urinary retention now with Foley catheter.  Urine output is improving. -Continue holding renal replacement therapy -Creatinine expected to rise toward his baseline -Agree with Foley catheter given urinary retention -Continue IV Lasix 120 mg twice daily to assist with volume removal  -Can increase Lasix if needed for worsening hypoxia -Continue to monitor daily Cr, Dose meds for GFR -Monitor Daily I/Os, Daily weight  -Maintain MAP>65 for optimal renal perfusion.  -Avoid nephrotoxic medications including NSAIDs and Vanc/Zosyn combo -Continue treating sepsis as below  Acute hypoxic respiratory failure: Related to pneumonia and possibly some volume overload.  FiO2 is up and down.  Continue diuresis as above and treatment of pneumonia as below  Septic shock: Antibiotics per primary team.  Shock significantly improved now off pressors.  Pneumonia: Concern for PJP.  Antibiotics and management per primary  HIV: ID managing  Hypertension: History of such.  Holding home blood pressure medications  DM2: Insulin management per primary  Anemia: Likely multifactorial with infection contributing.  Transfuse for hemoglobin less than 7 as needed  Recommendations conveyed to primary service.     J  [facility] 03/11/2021 12:27 PM  ___________________________________________________________  CC: Acute renal failure  Interval History/Subjective: Off of CRRT.  Urine output 2.3 L.  Creatinine rising slightly to 1.9 which is not expected.  His baseline is 2.5-3.  Slight increase in FiO2 today to  60%.   Medications:  Current Facility-Administered Medications  Medication Dose Route Frequency Provider Last Rate Last Admin   0.9 %  sodium chloride infusion  250 mL Intravenous Continuous Smith, Daniel C, MD 10 mL/hr at 03/10/21 0111 Infusion Verify at 03/10/21 0111   0.9 %  sodium chloride infusion  250 mL Intravenous Continuous Sommer, Steven E, MD 10 mL/hr at 03/11/21 1200 Infusion Verify at 03/11/21 1200   acetaminophen (TYLENOL) 160 MG/5ML solution 650 mg  650 mg Per Tube Q6H PRN Wert, Michael B, MD   650 mg at 03/10/21 2241   albuterol (PROVENTIL) (2.5 MG/3ML) 0.083% nebulizer solution 2.5 mg  2.5 mg Nebulization Q2H PRN Gonzales, Nicole, MD       alteplase (CATHFLO ACTIVASE) injection 2 mg  2 mg Intracatheter Once Wert, Michael B, MD       alteplase (CATHFLO ACTIVASE) injection 2 mg  2 mg Intracatheter Once Schertz, Robert, MD       ceFAZolin (ANCEF) IVPB 2g/100 mL premix  2 g Intravenous Q8H Green, Terri L, RPH   Stopped at 03/11/21 0944   chlorhexidine gluconate (MEDLINE KIT) (PERIDEX) 0.12 % solution 15 mL  15 mL Mouth Rinse BID Smith, Daniel C, MD   15 mL at 03/11/21 0800   Chlorhexidine Gluconate Cloth 2 % PADS 6 each  6 each Topical Daily Smith, Daniel C, MD   6 each at 03/11/21 0914   clonazePAM (KLONOPIN) tablet 1 mg  1 mg Oral BID Hunsucker, Matthew R, MD       dapsone tablet 100 mg  100 mg Per Tube Daily Snider, Cynthia, MD   100 mg at 03/11/21 0915   dexmedetomidine (PRECEDEX) 200 MCG/50ML (4 mcg/mL) infusion  0.4-1.2 mcg/kg/hr Intravenous Titrated Ogan, Okoronkwo U,   MD   Stopped at 03/11/21 0005   docusate (COLACE) 50 MG/5ML liquid 100 mg  100 mg Per Tube BID PRN Wert, Michael B, MD   100 mg at 03/08/21 1358   dolutegravir (TIVICAY) tablet 50 mg  50 mg Per Tube Daily Wert, Michael B, MD   50 mg at 03/11/21 0915   feeding supplement (PIVOT 1.5 CAL) liquid 1,000 mL  1,000 mL Per Tube Q24H Hunsucker, Matthew R, MD 60 mL/hr at 03/11/21 0410 Restarted at 03/11/21 0410   fentaNYL  (SUBLIMAZE) bolus via infusion 200 mcg  200 mcg Intravenous Q1H PRN Hunsucker, Matthew R, MD   200 mcg at 03/11/21 0537   fentaNYL 2500mcg in NS 250mL (10mcg/ml) infusion-PREMIX  0-400 mcg/hr Intravenous Continuous Izquierdo, Manuel E, MD 40 mL/hr at 03/11/21 1200 400 mcg/hr at 03/11/21 1200   fluconazole (DIFLUCAN) IVPB 200 mg  200 mg Intravenous Q24H Green, Terri L, RPH   Stopped at 03/11/21 0101   furosemide (LASIX) 120 mg in dextrose 5 % 50 mL IVPB  120 mg Intravenous BID ,  J, MD   Stopped at 03/11/21 1121   gabapentin (NEURONTIN) 250 MG/5ML solution 300 mg  300 mg Per Tube Q8H Wert, Michael B, MD   300 mg at 03/11/21 0643   guaiFENesin (ROBITUSSIN) 100 MG/5ML solution 100 mg  5 mL Per Tube Q4H PRN Wert, Michael B, MD       heparin injection 5,000 Units  5,000 Units Subcutaneous Q8H Gonzales, Nicole, MD   5,000 Units at 03/11/21 0643   insulin aspart (novoLOG) injection 0-15 Units  0-15 Units Subcutaneous Q4H Ollis, Brandi L, NP   3 Units at 03/11/21 0900   ipratropium-albuterol (DUONEB) 0.5-2.5 (3) MG/3ML nebulizer solution 3 mL  3 mL Nebulization Q4H Izquierdo, Manuel E, MD   3 mL at 03/11/21 1105   lamiVUDine (EPIVIR) tablet 150 mg  150 mg Per Tube Daily Wert, Michael B, MD   150 mg at 03/11/21 0914   MEDLINE mouth rinse  15 mL Mouth Rinse 10 times per day Smith, Daniel C, MD   15 mL at 03/11/21 0922   midazolam (VERSED) 50 mg/50 mL (1 mg/mL) premix infusion  1-10 mg/hr Intravenous Continuous Wert, Michael B, MD 2.5 mL/hr at 03/11/21 1200 2.5 mg/hr at 03/11/21 1200   midazolam (VERSED) bolus via infusion 4 mg  4 mg Intravenous Q2H PRN Green, Terri L, RPH   4 mg at 03/10/21 2034   ondansetron (ZOFRAN) injection 4 mg  4 mg Intravenous Q6H PRN Gonzales, Nicole, MD       oxyCODONE (OXYCONTIN) 12 hr tablet 10 mg  10 mg Oral Q12H Hoffman, Paul W, NP       pantoprazole sodium (PROTONIX) 40 mg/20 mL oral suspension 40 mg  40 mg Per Tube QHS Bell, Michelle T, RPH   40 mg at 03/10/21 2245    polyethylene glycol (MIRALAX / GLYCOLAX) packet 17 g  17 g Per Tube Daily PRN Wert, Michael B, MD       propofol (DIPRIVAN) 1000 MG/100ML infusion  5-80 mcg/kg/min Intravenous Titrated Desai, Nikita S, MD 22.2 mL/hr at 03/11/21 1200 40 mcg/kg/min at 03/11/21 1200   sodium chloride flush (NS) 0.9 % injection 10-40 mL  10-40 mL Intracatheter Q12H Wert, Michael B, MD   10 mL at 03/11/21 0921   sodium chloride flush (NS) 0.9 % injection 10-40 mL  10-40 mL Intracatheter PRN Wert, Michael B, MD          Review of   Systems: Unable to obtain due to the patient's sedation  Physical Exam: Vitals:   03/11/21 1105 03/11/21 1200  BP:  (!) 116/51  Pulse:  (!) 103  Resp:  18  Temp:  99.5 F (37.5 C)  SpO2: 90% 91%   Total I/O In: 938.1 [I.V.:476.1; NG/GT:300; IV Piggyback:162] Out: 115 [Emesis/NG output:115]  Intake/Output Summary (Last 24 hours) at 03/11/2021 1227 Last data filed at 03/11/2021 1200 Gross per 24 hour  Intake 3488.84 ml  Output 1665 ml  Net 1823.84 ml   Constitutional: Ill-appearing, intubated and sedated ENMT: ears and nose without scars or lesions, MMM CV: Tachycardic, no peripheral edema Respiratory: Coarse bilateral breath sounds but improved overall, ventilated, bilateral chest rise Gastrointestinal: soft, non-tender, no palpable masses or hernias Skin: no visible lesions or rashes Psych: Sedated, does not respond to questioning   Test Results I personally reviewed new and old clinical labs and radiology tests Lab Results  Component Value Date   NA 140 03/11/2021   K 3.8 03/11/2021   CL 106 03/11/2021   CO2 28 03/11/2021   BUN 62 (H) 03/11/2021   CREATININE 1.90 (H) 03/11/2021   CALCIUM 8.4 (L) 03/11/2021   ALBUMIN 1.9 (L) 03/11/2021   ALBUMIN 1.9 (L) 03/11/2021   PHOS 4.8 (H) 03/11/2021   PHOS 4.7 (H) 03/11/2021       

## 2021-03-11 NOTE — Progress Notes (Signed)
Pt agitated, non-compliant with ventilator,  O2 saturations <88%, febrile.  Elink MD notified.  New orders for agitation and vent compliance placed and given.  RN at bedside for ET tube advancement by CRNA and RT.  Antipyretic given.  Pt more compliant with ventilator, O2 saturations >92%.  RN will continue to carefully monitor.

## 2021-03-11 NOTE — Progress Notes (Addendum)
NAME:  Bruce Hunter, MRN:  [id number], DOB:  12-02-1975, LOS: 7 ADMISSION DATE:  03/04/2021, CONSULTATION DATE:  03/04/21 REFERRING MD:  Melina Copa, CHIEF COMPLAINT:  Shortness of breath.     Brief Narrative:  45 yo man with HIV/AIDS here with acute hypoxemic respiratory failure.  Complaints of SOB and cough productive of tan sputum.  Has had respiratory symptoms since he was discharged earlier this month (6/13).     Recently diagnosed with HIV/AIDS, started on medications, but patient tells me he "hasn't had time to start taking them" but he isn't really able to explain further.  Patient was initiated on lamivudine, Tivicay, dapsone and fluconazole on discharge.  Patient does have an appointment with infectious disease as outpatient.  Candida esophagitis. At the time.    ID note 6/17:  DTG plus lamivudine. He can get the combined pill, dovato, upon discharge. 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   Scheduled Meds:  alteplase  2 mg Intracatheter Once   alteplase  2 mg Intracatheter Once   chlorhexidine gluconate (MEDLINE KIT)  15 mL Mouth Rinse BID   Chlorhexidine Gluconate Cloth  6 each Topical Daily   dapsone  100 mg Per Tube Daily   dolutegravir  50 mg Per Tube Daily   feeding supplement (PIVOT 1.5 CAL)  1,000 mL Per Tube Q24H   gabapentin  300 mg Per Tube Q8H   heparin  5,000 Units Subcutaneous Q8H   insulin  aspart  0-15 Units Subcutaneous Q4H   ipratropium-albuterol  3 mL Nebulization Q4H   lamiVUDine  150 mg Per Tube Daily   mouth rinse  15 mL Mouth Rinse 10 times per day   pantoprazole sodium  40 mg Per Tube QHS   sodium chloride flush  10-40 mL Intracatheter Q12H   Continuous Infusions:  sodium chloride 10 mL/hr at 03/10/21 0111   sodium chloride 10 mL/hr at 03/11/21 0500    ceFAZolin (ANCEF) IV 2 g (03/11/21 0914)   dexmedetomidine (PRECEDEX) IV infusion Stopped (03/11/21 0005)   fentaNYL infusion INTRAVENOUS 400 mcg/hr (03/11/21 0542)   fluconazole (DIFLUCAN) IV Stopped (03/11/21 0101)   furosemide Stopped (03/10/21 1900)   ketamine (KETALAR) Adult IV Infusion Stopped (03/11/21 0033)   midazolam 6.5 mg/hr (03/11/21 0500)   propofol (DIPRIVAN) infusion 15 mcg/kg/min (03/11/21 0500)   PRN Meds:.acetaminophen, albuterol, docusate, fentaNYL, guaiFENesin, midazolam, ondansetron (ZOFRAN) IV, polyethylene glycol, sodium chloride flush     Interim History / Subjective:  Continues to fail attempts at sedation weans. Became very agitated again last night requiring deep sedation. Vent synchrony remains an issue.   Objective   Blood pressure (!) 124/55, pulse 97, temperature 97.7 F (36.5 C), resp. rate (!) 30, height _0  (1.803 m), weight 94.3 kg, SpO2 94 %.    Vent Mode: PRVC FiO2 (%):  [40 %-50 %] 40 % Set Rate:  [14 bmp-20 bmp] 14 bmp  Vt Set:  [550 mL] 550 mL PEEP:  [5 cmH20] 5 cmH20 Pressure Support:  [5 cmH20] 5 cmH20 Plateau Pressure:  [17 cmH20-23 cmH20] 17 cmH20   Intake/Output Summary (Last 24 hours) at 03/11/2021 0940 Last data filed at 03/11/2021 0500 Gross per 24 hour  Intake 3093.06 ml  Output 2232 ml  Net 861.06 ml    Filed Weights   03/09/21 0433 03/10/21 0500 03/11/21 0500  Weight: 95 kg 92.5 kg 94.3 kg        Examination:  General:  middle aged male on vent Neuro:  Sedated with intermittent periods of agitation HEENT:  [place]/AT, PERRL, no  JVD Cardiovascular:  RRR, no MRG Lungs:  Clear bilateral breath sounds. Tachypnea  Abdomen:  Soft, non-distended Musculoskeletal:  No acute deformity Skin:  Intact, no edema.     Resolved Hospital Problem list   Severe sepsis/Septic shock due to pneumonia Transaminitis  Assessment & Plan:   Acute hypoxemic respiratory failure due to pneumonia: AIDS, broad differential. DFA negative, MSSA on Sputum Cx. - Weaning FiO2 for sats 90 - 98% - VAP bundle - Was tolerating SBT yesterday, now he is not. Will check CXR, ABG - Fentanyl/Propofol infusions for RASS goal 0 to -2. DC versed - Will start oral meds to help wean sedative drips..  - Add clonazepam 1 mg BID and oxycodone 2m BID - Continue home gabapentin at reduced dose  PNA, immunocompromised in the setting of HIV/AIDS: MSSA on culture. DFA negative - Appreciate ID assistance  - Narrowed to Cefazolin - Dapsone per ID  AIDS/HIV: Low CD 4, HIV viral load >1.47 mil - Rx per ID. Lamivudine and tivicay.    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 - CRRT stopped.  - Lasix 1258mBID per nephrology, who are following.   LE edema, improved: Due to Hypoalbuminemia  - Monitor  Hx of HTN: Home medications include Norvasc, HCTZ, Lisinopril,  - Holding  home meds   Type 2 diabetes Neuropathic pain: in feet --SSI  --CBG checks q4  Anemia: Due to chronic disease, inflammation, AIDS.  --Transfuse Hgb < 7  Best Practice    Diet/type: tf per nutrition  DVT prophylaxis: prophylactic heparin  GI prophylaxis: PPI Lines:  HD catheter Foley:  N/A Code Status:  full code Last date of multidisciplinary goals of care discussion  Updated gf at bedside  Critical care time 40 minutes   PaGeorgann HousekeeperAGACNP-BC [place] personal pager PCCM on call pager ([phone] 7pm. Please call Elink 7p-7a. [phone]/03/2021 9:40 AM  Critical care attending  attestation note:   Patient seen and examined and relevant ancillary tests reviewed.  I agree with the assessment and plan of care as outlined by PaGeorgann HousekeeperNP.    HIV/AIDS with respiratory failure. MSSA on LRCx. Pneumocystis DFA negative. More hypoxemic this AM, Fi O2 to 60%, PEEP to 8. Agitated overnight and this AM, tachypnea on PSV, seems uncomfortable on PRVC.   Synopsis of assessment and plan:   Acute Hypoxemic Respiratory Failure due to ARDS from MSSA pneumonia: Worsening O2 through early morning. Agitated overnight and this AM, overnight CXR stable,  suspect de-recruited. --CXR, ABG now --continue cefazolin --Vap bundle --Stop solumedrol (received 5 days)   Toxic metabolic encephalopathy: high sedative needs, agitation. --Wean fentanyl and versed, mental status precludes extubation --Propofol to help wean off versed, minimize fentanyl   Acute renal failure: ATN in setting of  septic shock (resolved).  Dialysis holiday, assess response. UOP good. --dialysis, lasix per nephro   AIDS: --therapy per ID   CRITICAL CARE Performed by: Bonna Gains Claxton Levitz     Total critical care time: 33 minutes   Critical care time was exclusive of separately billable procedures and treating other patients.   Critical care was necessary to treat or prevent imminent or life-threatening deterioration.   Critical care was time spent personally by me on the following activities: development of treatment plan with patient and/or surrogate as well as nursing, discussions with consultants, evaluation of patient's response to treatment, examination of patient, obtaining history from patient or surrogate, ordering and performing treatments and interventions, ordering and review of laboratory studies, ordering and review of radiographic studies, pulse oximetry, re-evaluation of patient's condition and participation in multidisciplinary rounds.   Lanier Clam, MD     See Shea Evans for contact  info

## 2021-03-12 ENCOUNTER — Inpatient Hospital Stay (HOSPITAL_COMMUNITY): Payer: Medicaid Other

## 2021-03-12 DIAGNOSIS — J9601 Acute respiratory failure with hypoxia: Secondary | ICD-10-CM | POA: Diagnosis not present

## 2021-03-12 DIAGNOSIS — G9341 Metabolic encephalopathy: Secondary | ICD-10-CM | POA: Diagnosis not present

## 2021-03-12 DIAGNOSIS — B20 Human immunodeficiency virus [HIV] disease: Secondary | ICD-10-CM | POA: Diagnosis not present

## 2021-03-12 LAB — RENAL FUNCTION PANEL
Albumin: 2.1 g/dL — ABNORMAL LOW (ref 3.5–5.0)
Anion gap: 10 (ref 5–15)
BUN: 85 mg/dL — ABNORMAL HIGH (ref 6–20)
CO2: 24 mmol/L (ref 22–32)
Calcium: 8.3 mg/dL — ABNORMAL LOW (ref 8.9–10.3)
Chloride: 105 mmol/L (ref 98–111)
Creatinine, Ser: 2.56 mg/dL — ABNORMAL HIGH (ref 0.61–1.24)
GFR, Estimated: 31 mL/min — ABNORMAL LOW (ref >60)
Glucose, Bld: 154 mg/dL — ABNORMAL HIGH (ref 70–99)
Phosphorus: 5.8 mg/dL — ABNORMAL HIGH (ref 2.5–4.6)
Potassium: 4.6 mmol/L (ref 3.5–5.1)
Sodium: 139 mmol/L (ref 135–145)

## 2021-03-12 LAB — CBC
HCT: 29.4 % — ABNORMAL LOW (ref 39.0–52.0)
Hemoglobin: 8.5 g/dL — ABNORMAL LOW (ref 13.0–17.0)
MCH: 29.9 pg (ref 26.0–34.0)
MCHC: 28.9 g/dL — ABNORMAL LOW (ref 30.0–36.0)
MCV: 103.5 fL — ABNORMAL HIGH (ref 80.0–100.0)
Platelets: 227 10*3/uL (ref 150–400)
RBC: 2.84 MIL/uL — ABNORMAL LOW (ref 4.22–5.81)
RDW: 19.9 % — ABNORMAL HIGH (ref 11.5–15.5)
WBC: 19.1 10*3/uL — ABNORMAL HIGH (ref 4.0–10.5)
nRBC: 0.1 % (ref 0.0–0.2)

## 2021-03-12 LAB — GLUCOSE, CAPILLARY
Glucose-Capillary: 142 mg/dL — ABNORMAL HIGH (ref 70–99)
Glucose-Capillary: 112 mg/dL — ABNORMAL HIGH (ref 70–99)
Glucose-Capillary: 107 mg/dL — ABNORMAL HIGH (ref 70–99)
Glucose-Capillary: 129 mg/dL — ABNORMAL HIGH (ref 70–99)
Glucose-Capillary: 134 mg/dL — ABNORMAL HIGH (ref 70–99)
Glucose-Capillary: 168 mg/dL — ABNORMAL HIGH (ref 70–99)

## 2021-03-12 LAB — MAGNESIUM: Magnesium: 2 mg/dL (ref 1.7–2.4)

## 2021-03-12 IMAGING — CT CT HEAD W/O CM
4 series · 16 of 47 positions shown, 18 images · non-contrast
Comparison: None.

CLINICAL DATA: Delirium.

EXAM:
CT HEAD WITHOUT CONTRAST
TECHNIQUE: Contiguous axial images were obtained from the base of the skull
through the vertex without intravenous contrast.

[Series 2: head bone · axial · 0.48mm/px · z∈[+1545,+1577]mm · 3 of 80 slices shown]
[im 8/80  bone]
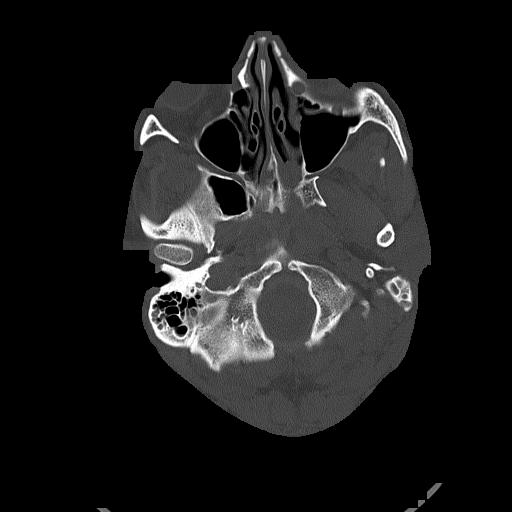
[im 16/80  bone]
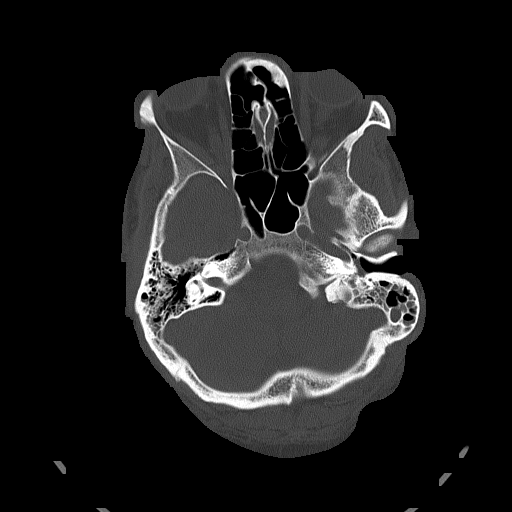
[im 24/80  bone]
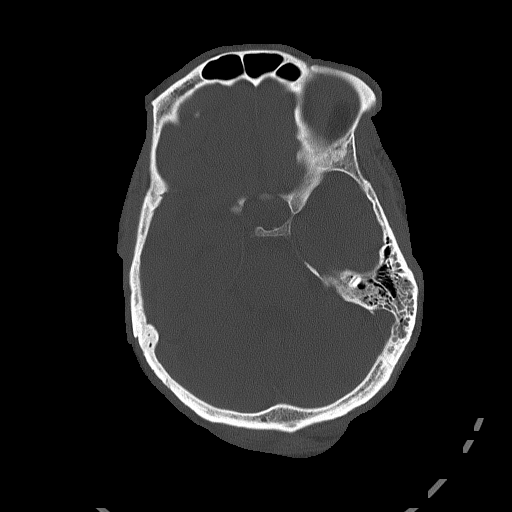

[Series 3: head wo · axial · 0.48mm/px · z∈[+1546,+1666]mm · 7 of 32 slices shown, 9 images]
[im 4/32  brain]
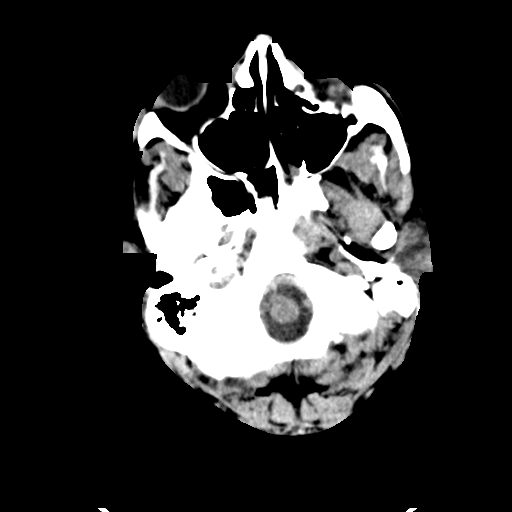
[im 4/32  bone]
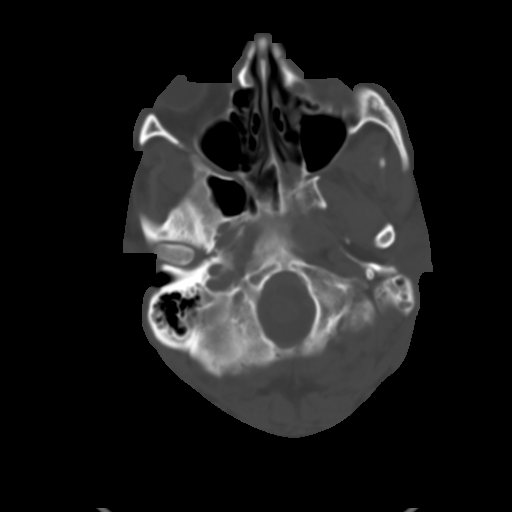
[im 8/32  brain]
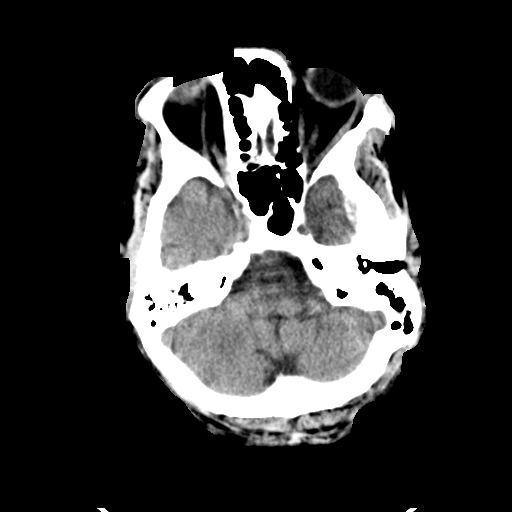
[im 12/32  brain]
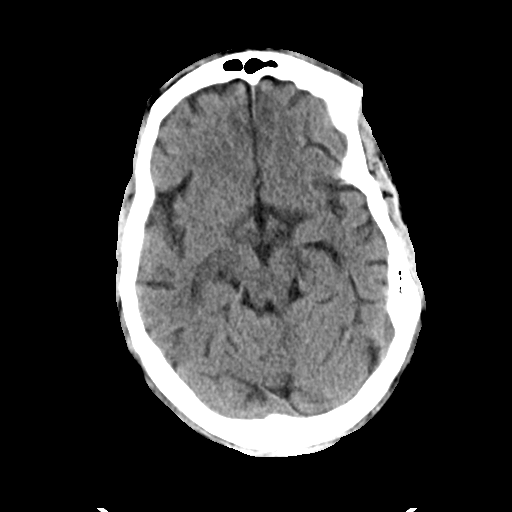
[im 16/32  brain]
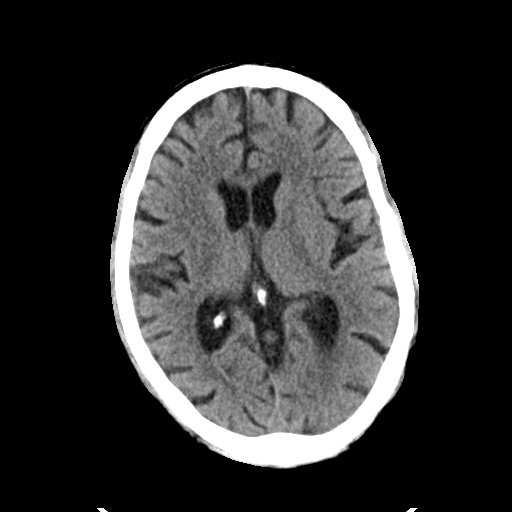
[im 20/32  brain]
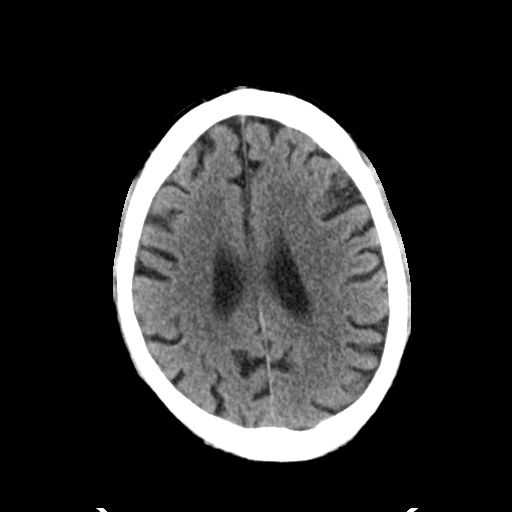
[im 20/32  bone]
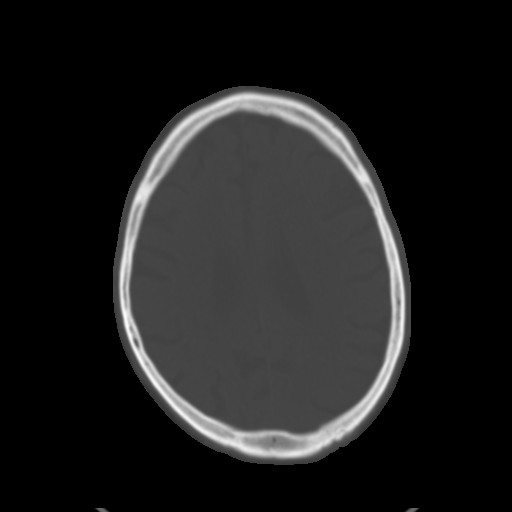
[im 24/32  brain]
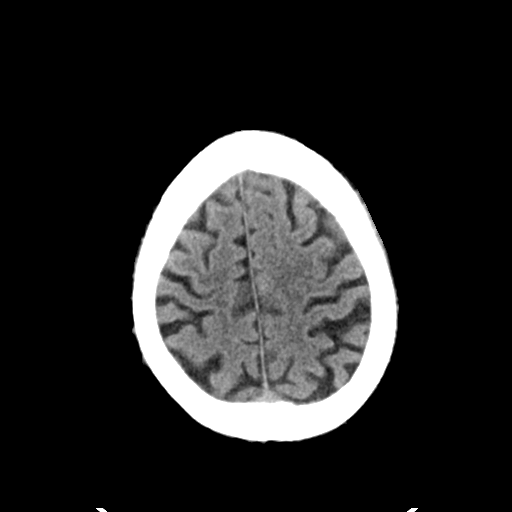
[im 28/32  brain]
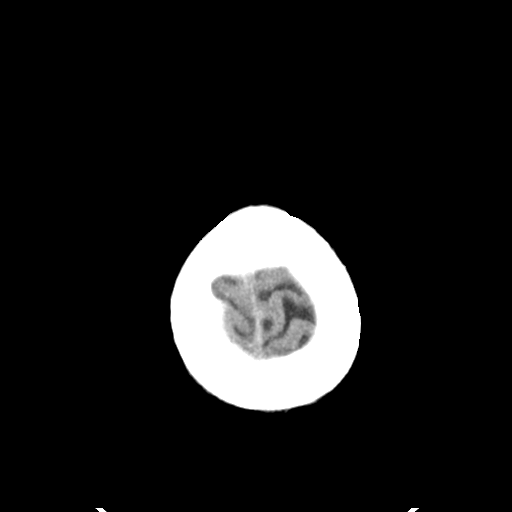

[Series 4: coronal soft tissue · coronal · 0.30mm/px · 3 of 71 slices shown]
[im 24/71  brain]
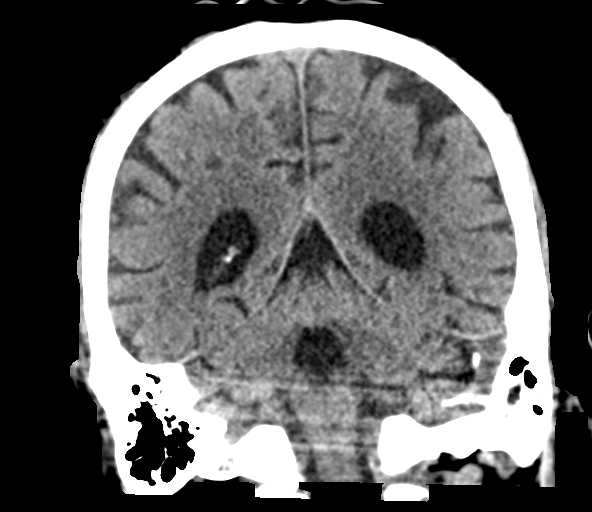
[im 32/71  brain]
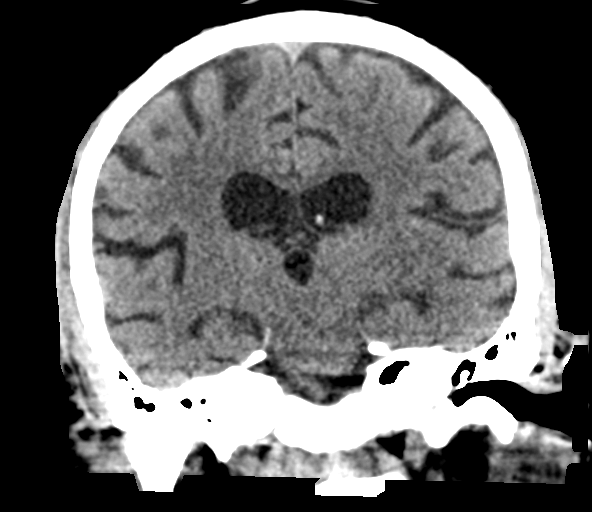
[im 39/71  brain]
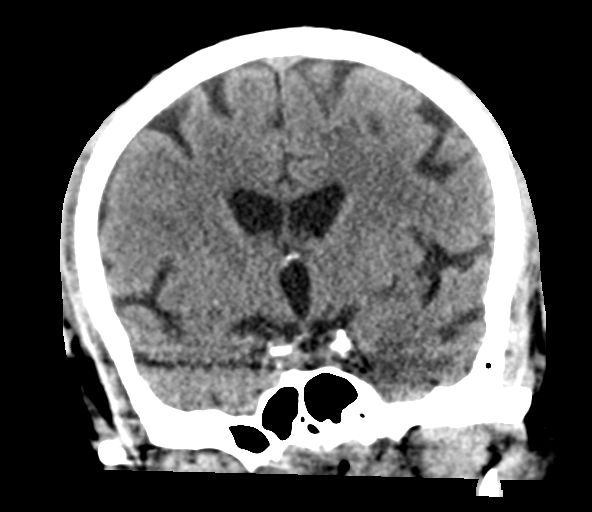

[Series 5: sagittal soft tissue · sagittal · 0.30mm/px · 3 of 60 slices shown]
[im 20/60  brain]
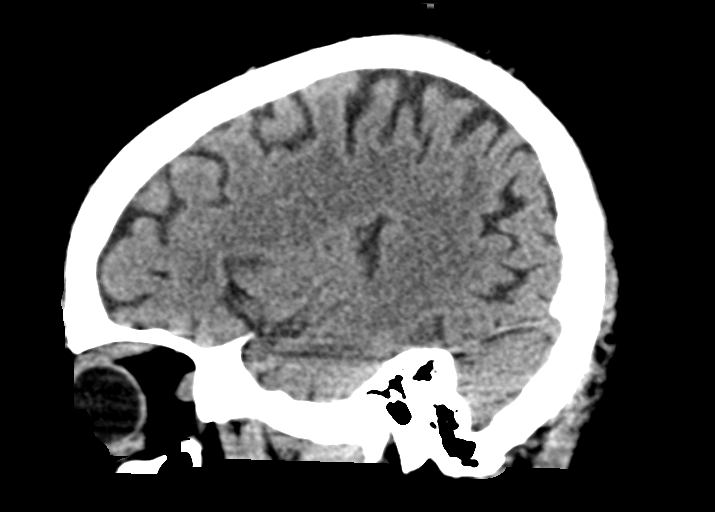
[im 30/60  brain]
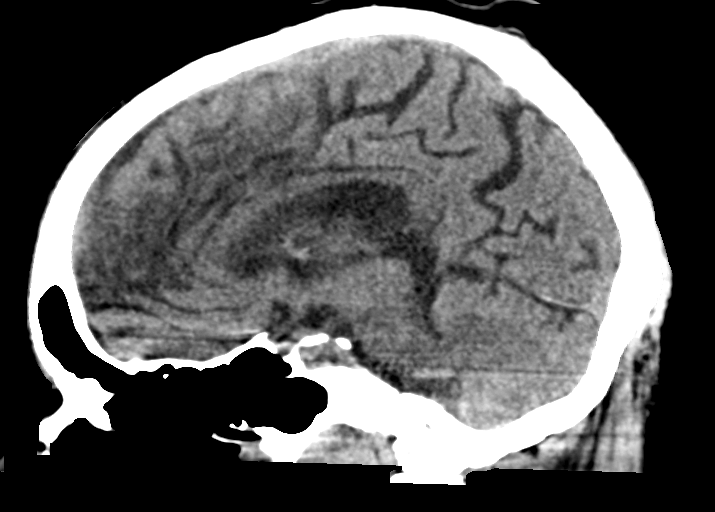
[im 40/60  brain]
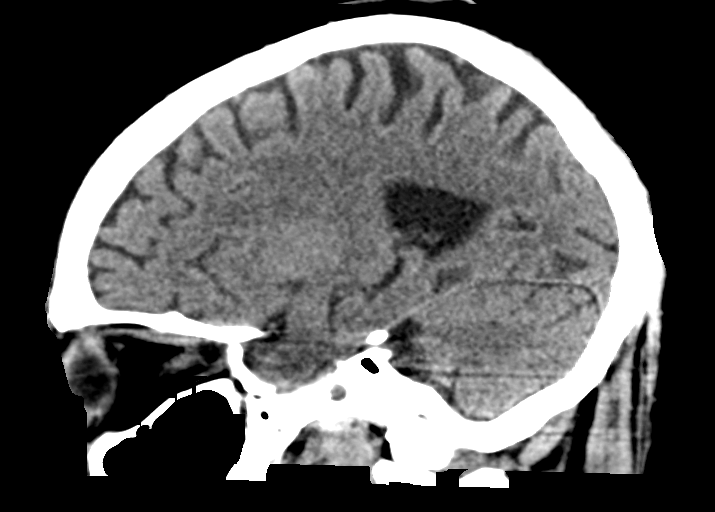

[16 of 47 positions shown; findings below may reference images not displayed]

FINDINGS: Brain: No evidence of acute infarction, hemorrhage, hydrocephalus,
extra-axial collection or mass lesion/mass effect. Mild atrophy with
ex vacuo ventricular dilation.

Vascular: No hyperdense vessel identified. Mild calcific
intracranial atherosclerosis.

Skull: No acute fracture.

Sinuses/Orbits: Visualized sinuses are clear.

Other: Prominent soft tissue/fluid in the posterior nasopharynx,
partially imaged.
IMPRESSION: 1. No evidence of acute intracranial abnormality. Mild atrophy.
2. Prominent soft tissue/fluid in the posterior nasopharynx,
possibly adenoid hypertrophy and layering secretions; however, this
area is incompletely imaged. Recommend correlation with direct
inspection.
3. Small left mastoid effusion.

## 2021-03-12 IMAGING — DX DG ABDOMEN 1V
1 series · 1 of 1 positions shown · non-contrast
Comparison: [DATE]

CLINICAL DATA: OG tube placement

EXAM:
ABDOMEN - 1 VIEW

[abdomen kub]
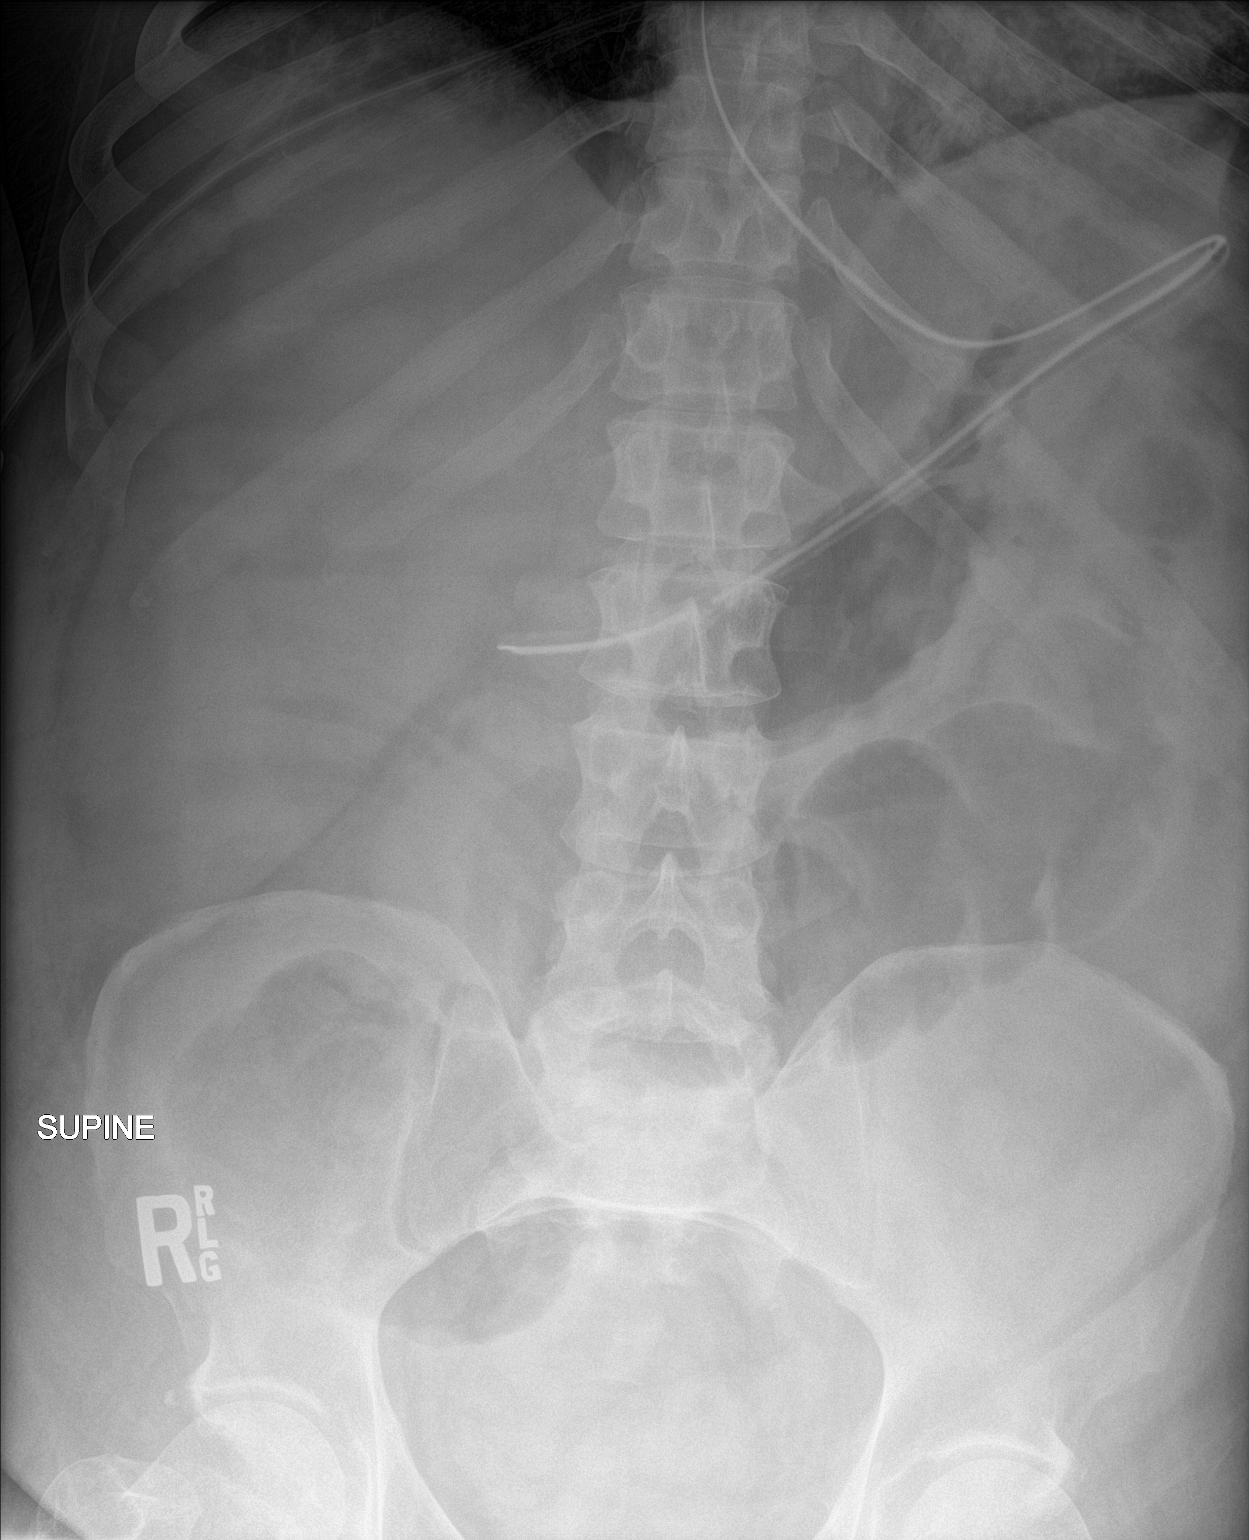

[1 of 1 positions shown; findings below may reference images not displayed]

FINDINGS: Enteric tube with the tip projecting over the antrum of the stomach.
No bowel dilatation to suggest obstruction. No evidence of
pneumoperitoneum, portal venous gas or pneumatosis.

No pathologic calcifications along the expected course of the
ureters.

No acute osseous abnormality.
IMPRESSION: Enteric tube with the tip projecting over the antrum of the stomach.

## 2021-03-12 MED ORDER — CLINDAMYCIN PHOSPHATE 900 MG/50ML IV SOLN
900.0000 mg | Freq: Three times a day (TID) | INTRAVENOUS | Status: DC
  Administered 2021-03-12 – 2021-03-30 (×54): 900 mg via INTRAVENOUS
  Filled 2021-03-12 (×58): qty 50

## 2021-03-12 MED ORDER — VANCOMYCIN HCL 1000 MG/200ML IV SOLN
1000.0000 mg | Freq: Once | INTRAVENOUS | Status: AC
  Administered 2021-03-12: 1000 mg via INTRAVENOUS
  Filled 2021-03-12: qty 200

## 2021-03-12 MED ORDER — EMTRICITABINE-TENOFOVIR AF 200-25 MG PO TABS
1.0000 | ORAL_TABLET | Freq: Every day | ORAL | Status: DC
  Administered 2021-03-13 – 2021-03-21 (×9): 1
  Filled 2021-03-12 (×9): qty 1

## 2021-03-12 MED ORDER — PRIMAQUINE PHOSPHATE 26.3 (15 BASE) MG PO TABS
30.0000 mg | ORAL_TABLET | Freq: Every day | ORAL | Status: DC
  Administered 2021-03-12 – 2021-03-16 (×5): 30 mg via ORAL
  Filled 2021-03-12 (×6): qty 2

## 2021-03-12 MED ORDER — VANCOMYCIN HCL 750 MG/150ML IV SOLN
750.0000 mg | Freq: Two times a day (BID) | INTRAVENOUS | Status: DC
  Filled 2021-03-12: qty 150

## 2021-03-12 MED ORDER — DEXTROSE 5 % IV SOLN
900.0000 mg | Freq: Two times a day (BID) | INTRAVENOUS | Status: DC
  Administered 2021-03-12 (×2): 900 mg via INTRAVENOUS
  Filled 2021-03-12 (×3): qty 18

## 2021-03-12 MED ORDER — LACTATED RINGERS IV SOLN: INTRAVENOUS | Status: DC

## 2021-03-12 MED ORDER — MIDAZOLAM HCL 2 MG/2ML IJ SOLN
2.0000 mg | INTRAMUSCULAR | Status: DC | PRN
  Administered 2021-03-15: 2 mg via INTRAVENOUS
  Filled 2021-03-12 (×2): qty 2

## 2021-03-12 MED ORDER — SODIUM CHLORIDE 0.9 % IV SOLN
2.0000 g | Freq: Two times a day (BID) | INTRAVENOUS | Status: DC
  Administered 2021-03-12 – 2021-03-15 (×7): 2 g via INTRAVENOUS
  Filled 2021-03-12: qty 2
  Filled 2021-03-12 (×2): qty 20
  Filled 2021-03-12 (×3): qty 2
  Filled 2021-03-12: qty 20

## 2021-03-12 NOTE — Progress Notes (Signed)
During 0800 assessment, found OG tube dislodged... advanced OG to 32 cm... Notified Elon Alas, NP and VO given for KUB.   TF held until confirmation.

## 2021-03-12 NOTE — Progress Notes (Signed)
Around 0500 patient became more dyssynchronous on the vent with increasing  tachypnea. Patient's respiratory rate 25-29. 200 mcg bolus of Fentanyl administered per MD order and Propofol increased to a rate of 45 mcg/kg.min. Upon assessment also noted that patient had an upward gaze to the left eye. Pupils reactive but sluggish. Right sided gaze noted just as left sided eye gaze improved. Notified E-Link provider. Also notified RT to come assess for cuff link.

## 2021-03-12 NOTE — Progress Notes (Signed)
Nephrology Follow-Up Consult note   Assessment/Recommendations: Bruce Hunter is a/an 45 y.o. male with a past medical history significant for HIV/AIDS, CKD, asthma, obesity, DM2 admitted for acute hypoxic respiratory failure with sepsis.      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  CKD with baseline of 2.5-3 (thus expected to rise at least to same).  Did have some urinary retention now with Foley catheter.   -Continue holding renal replacement therapy.  Would retain access for now - spoke with critical care - Agree with Foley catheter given urinary retention - Will pause lasix for now - would have low threshold to reduce gabapentin to a max of 300 mg daily   Acute hypoxic respiratory failure: Related to pneumonia and possibly some volume overload.  Optimize volume as above and treatment of pneumonia per primary team   Septic shock: Antibiotics per primary team.  Shock significantly improved   Pneumonia: Concern for PJP.  Antibiotics and management per primary  HIV: ID managing  Hypertension: History of such.  Holding home blood pressure medications given hx of septic shock  DM2: Insulin management per primary  Anemia macrocytic. Likely multifactorial with infection contributing.  Slightly Improved.   ___________________________________________________________   Interval History/Subjective: had 2.1 liters UOP over 7/7.  baseline Cr is 2.5-3.  Off of CRRT on 7/6.  His girlfriend is at bedside.  His mother makes medical decisions and critical care has come to bedside and is about to update appropriate parties.   Review of systems: Unable to obtain 2/2 AMS   Medications:  Current Facility-Administered Medications  Medication Dose Route Frequency Provider Last Rate Last Admin   0.9 %  sodium chloride infusion  250 mL Intravenous Continuous Candee Furbish, MD 10 mL/hr at 03/10/21 0111 Infusion Verify at 03/10/21 0111   0.9 %  sodium chloride  infusion  250 mL Intravenous Continuous Anders Simmonds, MD 10 mL/hr at 03/12/21 1000 Infusion Verify at 03/12/21 1000   acetaminophen (TYLENOL) 160 MG/5ML solution 650 mg  650 mg Per Tube Q6H PRN Tanda Rockers, MD   650 mg at 03/12/21 5809   albuterol (PROVENTIL) (2.5 MG/3ML) 0.083% nebulizer solution 2.5 mg  2.5 mg Nebulization Q2H PRN Collier Bullock, MD       alteplase (CATHFLO ACTIVASE) injection 2 mg  2 mg Intracatheter Once Tanda Rockers, MD       alteplase (CATHFLO ACTIVASE) injection 2 mg  2 mg Intracatheter Once Roney Jaffe, MD       cefTRIAXone (ROCEPHIN) 2 g in sodium chloride 0.9 % 100 mL IVPB  2 g Intravenous Q12H Corey Harold, NP       chlorhexidine gluconate (MEDLINE KIT) (PERIDEX) 0.12 % solution 15 mL  15 mL Mouth Rinse BID Candee Furbish, MD   15 mL at 03/12/21 0803   Chlorhexidine Gluconate Cloth 2 % PADS 6 each  6 each Topical Daily Candee Furbish, MD   6 each at 03/12/21 1029   clonazePAM (KLONOPIN) tablet 1 mg  1 mg Per Tube BID Hunsucker, Bonna Gains, MD   1 mg at 03/12/21 1028   dapsone tablet 100 mg  100 mg Per Tube Daily Carlyle Basques, MD   100 mg at 03/12/21 1028   dexmedetomidine (PRECEDEX) 200 MCG/50ML (4 mcg/mL) infusion  0.4-1.2 mcg/kg/hr Intravenous Titrated Frederik Pear, MD   Stopped at 03/11/21 0005   docusate (COLACE) 50 MG/5ML liquid 100 mg  100 mg Per Tube  BID PRN Tanda Rockers, MD   100 mg at 03/08/21 1358   dolutegravir (TIVICAY) tablet 50 mg  50 mg Per Tube Daily Tanda Rockers, MD   50 mg at 03/12/21 1027   feeding supplement (PIVOT 1.5 CAL) liquid 1,000 mL  1,000 mL Per Tube Q24H Hunsucker, Bonna Gains, MD 60 mL/hr at 03/11/21 1641 1,000 mL at 03/11/21 1641   fentaNYL (SUBLIMAZE) bolus via infusion 200 mcg  200 mcg Intravenous Q1H PRN Hunsucker, Bonna Gains, MD   200 mcg at 03/12/21 0501   fentaNYL 2570mg in NS 2567m(1070mml) infusion-PREMIX  0-400 mcg/hr Intravenous Continuous IzqTyna JakschD 40 mL/hr at 03/12/21 1000 400 mcg/hr  at 03/12/21 1000   fluconazole (DIFLUCAN) IVPB 200 mg  200 mg Intravenous Q24H GreMinda DittoPH   Stopped at 03/11/21 2322   furosemide (LASIX) 120 mg in dextrose 5 % 50 mL IVPB  120 mg Intravenous BID PeeReesa ChewD   Stopped at 03/12/21 0907   gabapentin (NEURONTIN) 250 MG/5ML solution 300 mg  300 mg Per Tube Q8H WerTanda RockersD   300 mg at 03/12/21 0504   guaiFENesin (ROBITUSSIN) 100 MG/5ML solution 100 mg  5 mL Per Tube Q4H PRN WerTanda RockersD       heparin injection 5,000 Units  5,000 Units Subcutaneous Q8H GonCollier BullockD   5,000 Units at 03/12/21 0504   insulin aspart (novoLOG) injection 0-15 Units  0-15 Units Subcutaneous Q4H Ollis, Brandi L, NP   2 Units at 03/12/21 0358   ipratropium-albuterol (DUONEB) 0.5-2.5 (3) MG/3ML nebulizer solution 3 mL  3 mL Nebulization Q4H IzqTyna JakschD   3 mL at 03/12/21 1105   lamiVUDine (EPIVIR) tablet 150 mg  150 mg Per Tube Daily WerTanda RockersD   150 mg at 03/12/21 1028   MEDLINE mouth rinse  15 mL Mouth Rinse 10 times per day SmiCandee FurbishD   15 mL at 03/12/21 1029   midazolam (VERSED) 50 mg/50 mL (1 mg/mL) premix infusion  1-10 mg/hr Intravenous Continuous WerTanda RockersD   Stopped at 03/11/21 1833   midazolam (VERSED) bolus via infusion 4 mg  4 mg Intravenous Q2H PRN GreMinda DittoPH   4 mg at 03/10/21 2034   ondansetron (ZOFRAN) injection 4 mg  4 mg Intravenous Q6H PRN GonCollier BullockD       oxyCODONE (ROXICODONE) 5 MG/5ML solution 5 mg  5 mg Per Tube Q6H HofCorey HaroldP   5 mg at 03/12/21 1029   pantoprazole sodium (PROTONIX) 40 mg/20 mL oral suspension 40 mg  40 mg Per Tube QHS BelLeodis Sias RPH   40 mg at 03/11/21 2228   polyethylene glycol (MIRALAX / GLYCOLAX) packet 17 g  17 g Per Tube Daily PRN WerTanda RockersD       propofol (DIPRIVAN) 1000 MG/100ML infusion  5-80 mcg/kg/min Intravenous Titrated DesSpero GeraldsD 25 mL/hr at 03/12/21 1006 45 mcg/kg/min at 03/12/21 1006   sodium  chloride flush (NS) 0.9 % injection 10-40 mL  10-40 mL Intracatheter Q12H WerTanda RockersD   10 mL at 03/12/21 1029   sodium chloride flush (NS) 0.9 % injection 10-40 mL  10-40 mL Intracatheter PRN WerTanda RockersD         Physical Exam: Vitals:   03/12/21 1000 03/12/21 1106  BP: (!) 129/58   Pulse: (!) 117   Resp: 13  Temp: 99.86 F (37.7 C)   SpO2: 94% 93%   Total I/O In: 635.8 [I.V.:235; NG/GT:240; IV Piggyback:160.7] Out: 525 [Urine:395; Emesis/NG output:130]  Intake/Output Summary (Last 24 hours) at 03/12/2021 1207 Last data filed at 03/12/2021 1000 Gross per 24 hour  Intake 3606.65 ml  Output 2695 ml  Net 911.65 ml   General adult male in bed intubated HEENT normocephalic atraumatic  Neck supple trachea midline Lungs coarse mechanical breath sounds; FIO2 60 and PEEP 8 Heart regular rate and rhythm no rubs or gallops appreciated Abdomen soft nontender with limits of sedation distended Extremities no edema  Neuro sedation running Access: RIJ nontunneled catheter     Test Results I personally reviewed new and old clinical labs and radiology tests Lab Results  Component Value Date   NA 139 03/12/2021   K 4.6 03/12/2021   CL 105 03/12/2021   CO2 24 03/12/2021   BUN 85 (H) 03/12/2021   CREATININE 2.56 (H) 03/12/2021   CALCIUM 8.3 (L) 03/12/2021   ALBUMIN 2.1 (L) 03/12/2021   PHOS 5.8 (H) 03/12/2021     Claudia Desanctis, MD 03/12/2021 12:28 PM

## 2021-03-12 NOTE — Progress Notes (Signed)
PCCM INTERVAL PROGRESS NOTE   Expanding antibiotics to include CNS coverage including acyclovir. CT non-acute. EEG pending. LP pending once off subcutaneous heparin x 24 hours. Will hold additional doses. Have discussed with RN. Last dose today at 1400.    Georgann Housekeeper, AGACNP-BC [facility]  See Amion for personal pager PCCM on call pager [phone] until 7pm. Please call Elink 7p-7a. [phone]  03/12/2021 4:13 PM

## 2021-03-12 NOTE — Progress Notes (Signed)
VO given by Elon Alas, NP to hold off TF at midnight for LP procedure tomorrow by IR.... will pass info onto night RN.

## 2021-03-12 NOTE — Progress Notes (Addendum)
Bruce Hunter for Infectious Disease    Date of Admission:  03/04/2021   Total days of antibiotics 9/day 4 cefazolin            Bruce Hunter is a 45 y.o. 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 Ictal event overnight.underwent EEG (read is pending) Medications:   alteplase  2 mg Intracatheter Once   alteplase  2 mg Intracatheter Once   chlorhexidine gluconate (MEDLINE KIT)  15 mL Mouth Rinse BID   Chlorhexidine Gluconate Cloth  6 each Topical Daily   clonazePAM  1 mg Per Tube BID   dapsone  100 mg Per Tube Daily   dolutegravir  50 mg Per Tube Daily   [START ON 03/13/2021] emtricitabine-tenofovir AF  1 tablet Per Tube Daily   feeding supplement (PIVOT 1.5 CAL)  1,000 mL Per Tube Q24H   gabapentin  300 mg Per Tube Q8H   heparin  5,000 Units Subcutaneous Q8H   insulin aspart  0-15 Units Subcutaneous Q4H   ipratropium-albuterol  3 mL Nebulization Q4H   mouth rinse  15 mL Mouth Rinse 10 times per day   oxyCODONE  5 mg Per Tube Q6H   pantoprazole sodium  40 mg Per Tube QHS   sodium chloride flush  10-40 mL Intracatheter Q12H    Objective: Vital signs in last 24 hours: Temp:  [99.5 F (37.5 C)-102.4 F (39.1 C)] 99.68 F (37.6 C) (07/08 1651) Pulse Rate:  [335-456] 124 (07/08 1651) Resp:  [12-23] 16 (07/08 1651) BP: (93-162)/(50-76) 121/52 (07/08 1630) SpO2:  [90 %-100 %] 100 % (07/08 1651) FiO2 (%):  [6 %-60  %] 60 % (07/08 1600) Weight:  [92.9 kg] 92.9 kg (07/08 0500) Physical Exam  Constitutional: sedated. He appears well-developed and well-nourished. No distress.  HENT:  Mouth/Throat: OETT, blister on lower lip Cardiovascular: Normal rate, regular rhythm and normal heart sounds. Exam reveals no gallop and no friction rub.  No murmur heard.  Pulmonary/Chest: Effort normal and breath sounds normal. No respiratory distress. He has no wheezes.  Abdominal: Soft. Bowel sounds are decreased. He exhibits no distension. There is no tenderness.  Ext: trace edema Skin: Skin is warm and dry. No rash noted. No erythema.  Psychiatric: He has a normal mood and affect. His behavior is normal.   Lab Results Recent Labs    03/11/21 0520 03/12/21 0501  WBC 15.1* 19.1*  HGB 7.4* 8.5*  HCT 25.7* 29.4*  NA 140 139  K 3.8 4.6  CL 106 105  CO2 28 24  BUN 62* 85*  CREATININE 1.90* 2.56*   Liver Panel Recent Labs    03/10/21 2239 03/11/21 0520 03/12/21 0501  PROT 6.7 6.3*  --   ALBUMIN 2.0* 1.9*  1.9* 2.1*  AST 23 23  --   ALT 17 17  --   ALKPHOS 65 69  --   BILITOT  0.5 0.4  --   BILIDIR  --  0.1  --   IBILI  --  0.3  --      Microbiology: Reviewed.  Studies/Results: DG Abd 1 View  Result Date: 03/12/2021 CLINICAL DATA:  OG tube placement EXAM: ABDOMEN - 1 VIEW COMPARISON:  03/10/2021 FINDINGS: Enteric tube with the tip projecting over the antrum of the stomach. No bowel dilatation to suggest obstruction. No evidence of pneumoperitoneum, portal venous gas or pneumatosis. No pathologic calcifications along the expected course of the ureters. No acute osseous abnormality. IMPRESSION: Enteric tube with the tip projecting over the antrum of the stomach. Electronically Signed   By: Kathreen Devoid   On: 03/12/2021 10:17   DG Abd 1 View  Result Date: 03/10/2021 CLINICAL DATA:  OG tube placement. EXAM: ABDOMEN - 1 VIEW COMPARISON:  Radiograph yesterday. FINDINGS: The enteric tube is in the right mid  abdomen, tip likely in the third portion of the duodenum. The side-port is not well seen. No small bowel distension. IMPRESSION: Enteric tube with tip in the right mid abdomen, likely in the third portion of the duodenum. Electronically Signed   By: Keith Rake M.D.   On: 03/10/2021 22:33   CT HEAD WO CONTRAST  Result Date: 03/12/2021 CLINICAL DATA:  Delirium. EXAM: CT HEAD WITHOUT CONTRAST TECHNIQUE: Contiguous axial images were obtained from the base of the skull through the vertex without intravenous contrast. COMPARISON:  None. FINDINGS: Brain: No evidence of acute infarction, hemorrhage, hydrocephalus, extra-axial collection or mass lesion/mass effect. Mild atrophy with ex vacuo ventricular dilation. Vascular: No hyperdense vessel identified. Mild calcific intracranial atherosclerosis. Skull: No acute fracture. Sinuses/Orbits: Visualized sinuses are clear. Other: Prominent soft tissue/fluid in the posterior nasopharynx, partially imaged. IMPRESSION: 1. No evidence of acute intracranial abnormality. Mild atrophy. 2. Prominent soft tissue/fluid in the posterior nasopharynx, possibly adenoid hypertrophy and layering secretions; however, this area is incompletely imaged. Recommend correlation with direct inspection. 3. Small left mastoid effusion. Electronically Signed   By: Margaretha Sheffield MD   On: 03/12/2021 15:45   DG Chest Port 1 View  Result Date: 03/11/2021 CLINICAL DATA:  HIV/aids, acute hypoxic respiratory failure, shortness of breath, productive cough with tan sputum EXAM: PORTABLE CHEST 1 VIEW COMPARISON:  Portable exam 1130 hours compared to 03/10/2021 FINDINGS: Tip of endotracheal tube projects 3.0 cm above carina. Nasogastric tube extends into stomach. RIGHT jugular central venous catheter with tip projecting over SVC. Normal heart size mediastinal contours. BILATERAL pulmonary infiltrates, greatest in RIGHT upper lobe, consistent with multifocal pneumonia. No pleural effusion or  pneumothorax. IMPRESSION: BILATERAL pulmonary infiltrates consistent with multifocal pneumonia, greatest in RIGHT upper lobe. Little interval change since prior exam. Electronically Signed   By: Lavonia Dana M.D.   On: 03/11/2021 11:59   DG Chest Port 1 View  Result Date: 03/10/2021 CLINICAL DATA:  Endotracheal tube position.  Respiratory failure. EXAM: PORTABLE CHEST 1 VIEW COMPARISON:  Earlier today at 9:22 p.m. FINDINGS: Endotracheal tube tip 3.7 cm from the carina. Enteric tube in place with tip below the diaphragm. Right dialysis catheter in place. Stable bilateral airspace disease from earlier today. No pneumothorax or other interval change. IMPRESSION: 1. Endotracheal tube tip 3.7 cm from the carina. Enteric tube in place. 2. Stable bilateral airspace disease from earlier today. Electronically Signed   By: Keith Rake M.D.   On: 03/10/2021 22:32   DG Chest Port 1 View  Result Date: 03/10/2021 CLINICAL DATA:  Intubated EXAM: PORTABLE CHEST 1 VIEW COMPARISON:  03/09/2021, 03/07/2021, 03/06/2021, 03/05/2021 FINDINGS: Endotracheal tube tip is about 4 cm superior to carina. Right-sided central venous catheter tip over the SVC. Esophageal tube tip below the diaphragm but incompletely visualized. No significant interval change in extensive bilateral ground-glass opacities and mild consolidations. Stable cardiomediastinal silhouette. No pneumothorax. IMPRESSION: 1. Endotracheal tube tip about 4 cm superior to carina. 2. No significant interval change in fairly extensive bilateral airspace disease since radiograph 1 day prior Electronically Signed   By: Donavan Foil M.D.   On: 03/10/2021 21:34     Assessment/Plan: 45yo M with advanced, uncontrolled HIV disease, with respiratory failure initially treated for PJP but DFA stain negative, MSSA on culture. However started to have worsening fever and leukocytosis and agitation concerning for addn CNS infection including HSV, VZV, syphilis, Cryptococcal  meningitis  - recommend to get LP to check opening pressure, CSF, cell count, fungal culture, aerobic culture. Crypto Ag, HSV PCR, and VDRL and viral culture. VZV IgM - has been started on ceftriaxone 2gm IV Q12 (d/c cefazolin) and empirically started acyclovir  - serology testing: toxo antibody, cryptococcal antibody, histo antigen, blasto antigen, and RPR  HIV disease= will change him to tivicay and descovy   Oi proph = on fluconazole  Elevated fungitell = signifies possible invasive fungal infection. Can see with PCP pneumonia as well as cryptococcal infection. We will see if Cr Ag is negative. We will go back presuming this is PCP pneumonia ( clindamycin plus primaquine) in addition to MSSA pneumonia  MSSA pneumonia = being covered with ceftriaxone 2gm IV Q12h (increase for CNS coverage as well)  Respiratory distress/hypoxia on vent = continue to manage by [facility] for [facility]ious Diseases Cell: [phone] Pager: [phone]  03/12/2021, 5:12 PM

## 2021-03-12 NOTE — Progress Notes (Signed)
Pharmacy Antibiotic Note  Bruce Hunter is a 45 y.o. HIV+ male with CKD3 admitted on 03/04/2021 with respiratory failure and currently on treatment for MSSA PNA. Today noted to have new fevers, worsening leukocytosis, and worsening AMS. Pharmacy consulted to begin vancomycin + acyclovir for CNS coverage. Of note, patient with AoCKD on admission; just recently able to wean from CRRT and resolve volume overload.  Plan: Dose nephrotoxics and hydration gingerly given recent AKI and volume overload Vancomycin 1000 mg IV now, then 750 mg IV q12 hr (not using AUC dosing for CNS, but should generate AUC ~600) Measure vancomycin levels at steady state as indicated Acyclovir 900 mg (~10 mg/kg) IV q12 hr Lactated ringers @ 75 ml/hr while on acyclovir - discussed hydration with CCM and for now would limit to 75 ml/hr given recent fluid status issues; can decrease to 50 ml/hr if necessary SCr daily while on vanc and acyclovir Rocephin 2g IV q12 hr per CCM; dosing appropriate Continues on Diflucan, Dapsone, and HART per MD; no critical drug-drug interactions noted   Height: '5\' 11"'$  (180.3 cm) Weight: 92.9 kg (204 lb 12.9 oz) IBW/kg (Calculated) : 75.3  Temp (24hrs), Avg:100.5 F (38.1 C), Min:99.86 F (37.7 C), Max:102.4 F (39.1 C)  Recent Labs  Lab 03/07/21 0452 03/07/21 1530 03/08/21 0613 03/08/21 1612 03/09/21 1637 03/10/21 0408 03/10/21 2239 03/11/21 0520 03/12/21 0501  WBC 12.8*  --  11.2*  --   --   --  16.7* 15.1* 19.1*  CREATININE 3.99*   < > 2.51*   < > 1.69* 1.58* 1.73* 1.90* 2.56*   < > = values in this interval not displayed.    Estimated Creatinine Clearance: 42.9 mL/min (A) (by C-G formula based on SCr of 2.56 mg/dL (H)).    No Known Allergies  Antimicrobials this admission: 6/30 Cefepime >> 7/5 6/30 Vancomycin >> 7/2; resume 7/8 >>  6/30 CTX & Azith x 1 6/30 Clinda >> 7/5 7/1 primaquine 30 q24 >>7/5 7/1 fluconazole (from PTA) >>  7/1 Tivicay/epivir >> 7/5 Cefazolin  >> 7/8 7/5 Dapsone >> 7/8 Rocephin >>  7/8 Acyclovir >>  Dose adjustments this admission:  7/2 Cefepime 2q q24 > q12 & Fluc '200mg'$  q24 > '400mg'$  q24 CRRT 7/6 Ancef 2gm q12 > q8 & Fluconazole to '200mg'$  qd off CRRT  Microbiology results: 6/30 strep pneumo neg 6/30 covid/flu neg 7/1 AFB: not collected 6/30 BCx2: ng-final 7/1 MRSA neg 7/1 fungitell: ip 7/1 GI panel:neg 7/1 legionella: neg 7/1 resp panel:neg 7/2 Pneumocystis smear: sent, see neg DFA at [facility] 7/2 TrachAsp: MSSA, (R-TET only)  Thank you for allowing pharmacy to be a part of this patient's care.  Dornell Grasmick A 03/12/2021 12:50 PM

## 2021-03-12 NOTE — Plan of Care (Signed)
Discussed with family/friend in front of patient plan of care for the evening, pain management and sedation medications.  Also, discussed potential lumbar puncture procedure tomorrow with some teach back displayed by family/friend.  Problem: Education: Goal: Knowledge of General Education information will improve Description: Including pain rating scale, medication(s)/side effects and non-pharmacologic comfort measures Outcome: Progressing   Problem: Health Behavior/Discharge Planning: Goal: Ability to manage health-related needs will improve Outcome: Progressing

## 2021-03-12 NOTE — Progress Notes (Signed)
NAME:  Bruce Hunter, MRN:  [id number], DOB:  1976/08/18, LOS: 8 ADMISSION DATE:  03/04/2021, CONSULTATION DATE:  03/04/21 REFERRING MD:  Melina Copa, CHIEF COMPLAINT:  Shortness of breath.     Brief Narrative:  45 yo man with HIV/AIDS here with acute hypoxemic respiratory failure.  Complaints of SOB and cough productive of tan sputum.  Has had respiratory symptoms since he was discharged earlier this month (6/13).     Recently diagnosed with HIV/AIDS, started on medications, but patient tells me he "hasn't had time to start taking them" but he isn't really able to explain further.  Patient was initiated on lamivudine, Tivicay, dapsone and fluconazole on discharge.  Patient does have an appointment with infectious disease as outpatient.  Candida esophagitis. At the time.    ID note 6/17:  DTG plus lamivudine. He can get the combined pill, dovato, upon discharge. 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   Scheduled Meds:  alteplase  2 mg Intracatheter Once   alteplase  2 mg Intracatheter Once   chlorhexidine gluconate (MEDLINE KIT)  15 mL Mouth Rinse BID   Chlorhexidine Gluconate Cloth  6 each Topical Daily   clonazePAM  1 mg Per Tube BID   dapsone  100 mg Per Tube Daily   dolutegravir  50 mg Per Tube Daily   feeding supplement (PIVOT 1.5 CAL)  1,000 mL Per Tube Q24H   gabapentin  300 mg Per Tube Q8H   heparin  5,000  Units Subcutaneous Q8H   insulin aspart  0-15 Units Subcutaneous Q4H   ipratropium-albuterol  3 mL Nebulization Q4H   lamiVUDine  150 mg Per Tube Daily   mouth rinse  15 mL Mouth Rinse 10 times per day   oxyCODONE  5 mg Per Tube Q6H   pantoprazole sodium  40 mg Per Tube QHS   sodium chloride flush  10-40 mL Intracatheter Q12H   Continuous Infusions:  sodium chloride 10 mL/hr at 03/10/21 0111   sodium chloride 10 mL/hr at 03/12/21 0800    ceFAZolin (ANCEF) IV Stopped (03/12/21 0223)   dexmedetomidine (PRECEDEX) IV infusion Stopped (03/11/21 0005)   fentaNYL infusion INTRAVENOUS 400 mcg/hr (03/12/21 0800)   fluconazole (DIFLUCAN) IV Stopped (03/11/21 2322)   furosemide 120 mg (03/12/21 0807)   midazolam Stopped (03/11/21 1833)   propofol (DIPRIVAN) infusion 45 mcg/kg/min (03/12/21 0800)   PRN Meds:.acetaminophen, albuterol, docusate, fentaNYL, guaiFENesin, midazolam, ondansetron (ZOFRAN) IV, polyethylene glycol, sodium chloride flush     Interim History / Subjective:  Ongoing agitation despite deep sedation.   Objective   Blood pressure 132/62, pulse (!) 123, temperature (!) 100.58 F (38.1 C), temperature source Bladder, resp. rate 18, height 5' 11"  (1.803 m), weight 92.9 kg, SpO2 94 %.    Vent Mode: PRVC FiO2 (%):  [6 %-60 %] 60 % Set Rate:  [20 bmp] 20 bmp Vt Set:  [550 mL] 550 mL  PEEP:  [8 cmH20] 8 cmH20 Plateau Pressure:  [16 cmH20-25 cmH20] 24 cmH20   Intake/Output Summary (Last 24 hours) at 03/12/2021 0904 Last data filed at 03/12/2021 0800 Gross per 24 hour  Intake 3851.92 ml  Output 2550 ml  Net 1301.92 ml    Filed Weights   03/10/21 0500 03/11/21 0500 03/12/21 0500  Weight: 92.5 kg 94.3 kg 92.9 kg       Examination:  General:  middle aged male on vent Neuro:  sedated RASS -4 HEENT:  [place]/AT, PERRL, no JVD Cardiovascular:  RRR, no MRG Lungs:  Clear bilateral breath sounds. Diminished bases.  Abdomen:  Soft, non-tender, non-distended Musculoskeletal:  No  acute deformity Skin:  Grossly intact    Resolved Hospital Problem list   Severe sepsis/Septic shock due to pneumonia Transaminitis  Assessment & Plan:   Acute hypoxemic respiratory failure due to pneumonia: AIDS, broad differential. DFA negative, MSSA on Sputum Cx. - Weaning FiO2 for sats 90 - 98% - VAP bundle - Back up to 60% and 8 PEEP today with sats in the mid 90s.  - Fentanyl/Propofol infusions for RASS goal 0 to -2. - Clonazepam 1 mg BID and oxycodone 47m BID - Continue home gabapentin at reduced dose - Would be a reasonable candidate for early trach if his delirium does not improve with chemical modalities.   PNA, immunocompromised in the setting of HIV/AIDS: MSSA on culture. DFA negative - Appreciate ID assistance  - Narrowed to Cefazolin - Dapsone per ID - Leukocytosis worsening, febrile overnight. All cell lines up. Monitoring clinically. No escalation at this time.   Advanced HIV: Low CD 4, HIV viral load >1.47 mil - Rx per ID. Lamivudine and tivicay.    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 - CRRT stopped 7/6 - Lasix 1242mBID per nephrology, who are following.  - Following renal panel.   LE edema, improved: Due to Hypoalbuminemia  - Monitor  Hx of HTN: Home medications include Norvasc, HCTZ, Lisinopril,  - Holding home medications fo rnow   Type 2 diabetes Neuropathic pain: in feet --SSI  --CBG checks q4  Anemia: Due to chronic disease, inflammation, AIDS.  --Transfuse Hgb < 7  Best Practice    Diet/type: tf per nutrition  DVT prophylaxis: prophylactic heparin  GI prophylaxis: PPI Lines:  HD catheter Foley:  N/A Code Status:  full code Last date of multidisciplinary goals of care discussion  Updated gf at bedside  Critical care time 42 minutes   PaGeorgann HousekeeperAGACNP-BC [place] personal pager PCCM on call pager [phone] 7pm. Please call Elink 7p-7a.  [phone]/04/2021 9:04 AM

## 2021-03-12 NOTE — Progress Notes (Signed)
Ceribel being used for this patient due to patient being unavailable for EEG. Dr. Marcha Dutton notified.

## 2021-03-12 NOTE — Progress Notes (Signed)
Wasted 61 ml of Ketamine w/Alexis RN. Engineer, manufacturing)

## 2021-03-12 NOTE — Procedures (Addendum)
Patient Name: Bruce Hunter  MRN: [id number]  Epilepsy Attending: Lora Havens  Referring Physician/Provider: Georgann Housekeeper, NP Duration: 03/12/2021 1708 to 03/13/2021 1026  Patient history: 45 year old male with history of HIV/AIDS presented with altered mental status.  EEG to evaluate for seizures.  Level of alertness:  comatose  AEDs during EEG study: Clonazepam, gabapentin, propofol  Technical aspects: This EEG was obtained using a 10 lead EEG system positioned circumferentially without any parasagittal coverage (rapid EEG). Computer selected EEG is reviewed as  well as background features and all clinically significant events.  Description: EEG showed continuous generalized 3 to 6 Hz theta-delta slowing.  Hyperventilation and photic stimulation were not performed.     ABNORMALITY - Continuous slow, generalized  IMPRESSION: This limited ceribell EEG is suggestive of moderate to severe diffuse encephalopathy, nonspecific etiology. No seizures or epileptiform discharges were seen throughout the recording.  If suspicion for ictal- interictal activity remains a concern, a traditional eeg should be considered.    Kallen Mccrystal Barbra Sarks

## 2021-03-12 NOTE — Progress Notes (Signed)
Ceribell attached to patient to begin eeg starting now throughout the night, electrodes all attached, ready on screen. Will continue to monitor patient at this time

## 2021-03-13 ENCOUNTER — Inpatient Hospital Stay (HOSPITAL_COMMUNITY): Payer: Medicaid Other

## 2021-03-13 DIAGNOSIS — G9341 Metabolic encephalopathy: Secondary | ICD-10-CM | POA: Diagnosis not present

## 2021-03-13 DIAGNOSIS — N170 Acute kidney failure with tubular necrosis: Secondary | ICD-10-CM

## 2021-03-13 DIAGNOSIS — B20 Human immunodeficiency virus [HIV] disease: Secondary | ICD-10-CM | POA: Diagnosis not present

## 2021-03-13 DIAGNOSIS — J9601 Acute respiratory failure with hypoxia: Secondary | ICD-10-CM | POA: Diagnosis not present

## 2021-03-13 LAB — SAVE SMEAR(SSMR), FOR PROVIDER SLIDE REVIEW

## 2021-03-13 LAB — GLUCOSE, CAPILLARY
Glucose-Capillary: 100 mg/dL — ABNORMAL HIGH (ref 70–99)
Glucose-Capillary: 120 mg/dL — ABNORMAL HIGH (ref 70–99)
Glucose-Capillary: 122 mg/dL — ABNORMAL HIGH (ref 70–99)
Glucose-Capillary: 132 mg/dL — ABNORMAL HIGH (ref 70–99)
Glucose-Capillary: 142 mg/dL — ABNORMAL HIGH (ref 70–99)
Glucose-Capillary: 153 mg/dL — ABNORMAL HIGH (ref 70–99)
Glucose-Capillary: 73 mg/dL (ref 70–99)

## 2021-03-13 LAB — RENAL FUNCTION PANEL
Albumin: 1.8 g/dL — ABNORMAL LOW (ref 3.5–5.0)
Albumin: 1.8 g/dL — ABNORMAL LOW (ref 3.5–5.0)
Albumin: 1.9 g/dL — ABNORMAL LOW (ref 3.5–5.0)
Anion gap: 12 (ref 5–15)
Anion gap: 12 (ref 5–15)
Anion gap: 16 — ABNORMAL HIGH (ref 5–15)
BUN: 98 mg/dL — ABNORMAL HIGH (ref 6–20)
BUN: 98 mg/dL — ABNORMAL HIGH (ref 6–20)
BUN: 98 mg/dL — ABNORMAL HIGH (ref 6–20)
CO2: 23 mmol/L (ref 22–32)
CO2: 21 mmol/L — ABNORMAL LOW (ref 22–32)
CO2: 19 mmol/L — ABNORMAL LOW (ref 22–32)
Calcium: 8.4 mg/dL — ABNORMAL LOW (ref 8.9–10.3)
Calcium: 8.6 mg/dL — ABNORMAL LOW (ref 8.9–10.3)
Calcium: 8.7 mg/dL — ABNORMAL LOW (ref 8.9–10.3)
Chloride: 102 mmol/L (ref 98–111)
Chloride: 102 mmol/L (ref 98–111)
Chloride: 101 mmol/L (ref 98–111)
Creatinine, Ser: 3.62 mg/dL — ABNORMAL HIGH (ref 0.61–1.24)
Creatinine, Ser: 4.01 mg/dL — ABNORMAL HIGH (ref 0.61–1.24)
Creatinine, Ser: 4.2 mg/dL — ABNORMAL HIGH (ref 0.61–1.24)
GFR, Estimated: 20 mL/min — ABNORMAL LOW (ref >60)
GFR, Estimated: 18 mL/min — ABNORMAL LOW (ref >60)
GFR, Estimated: 17 mL/min — ABNORMAL LOW (ref >60)
Glucose, Bld: 103 mg/dL — ABNORMAL HIGH (ref 70–99)
Glucose, Bld: 144 mg/dL — ABNORMAL HIGH (ref 70–99)
Glucose, Bld: 146 mg/dL — ABNORMAL HIGH (ref 70–99)
Phosphorus: 7.8 mg/dL — ABNORMAL HIGH (ref 2.5–4.6)
Phosphorus: 9.1 mg/dL — ABNORMAL HIGH (ref 2.5–4.6)
Phosphorus: 9.7 mg/dL — ABNORMAL HIGH (ref 2.5–4.6)
Potassium: 5.3 mmol/L — ABNORMAL HIGH (ref 3.5–5.1)
Potassium: 6.1 mmol/L — ABNORMAL HIGH (ref 3.5–5.1)
Potassium: 6.1 mmol/L — ABNORMAL HIGH (ref 3.5–5.1)
Sodium: 137 mmol/L (ref 135–145)
Sodium: 135 mmol/L (ref 135–145)
Sodium: 136 mmol/L (ref 135–145)

## 2021-03-13 LAB — DIC (DISSEMINATED INTRAVASCULAR COAGULATION)PANEL
D-Dimer, Quant: 16.01 ug/mL-FEU — ABNORMAL HIGH (ref 0.00–0.50)
Fibrinogen: >800 mg/dL — ABNORMAL HIGH (ref 210–475)
INR: 1.1 (ref 0.8–1.2)
Platelets: 202 10*3/uL (ref 150–400)
Prothrombin Time: 14.1 seconds (ref 11.4–15.2)
aPTT: 42 seconds — ABNORMAL HIGH (ref 24–36)

## 2021-03-13 LAB — CSF CELL COUNT WITH DIFFERENTIAL
RBC Count, CSF: 0 /mm3
Tube #: 4
WBC, CSF: 1 /mm3 (ref 0–5)

## 2021-03-13 LAB — CK TOTAL AND CKMB (NOT AT ARMC)
CK, MB: 2.1 ng/mL (ref 0.5–5.0)
Relative Index: 0.8 (ref 0.0–2.5)
Total CK: 266 U/L (ref 49–397)

## 2021-03-13 LAB — CBC
HCT: 23.4 % — ABNORMAL LOW (ref 39.0–52.0)
HCT: 23.1 % — ABNORMAL LOW (ref 39.0–52.0)
HCT: 26.9 % — ABNORMAL LOW (ref 39.0–52.0)
Hemoglobin: 6.7 g/dL — CL (ref 13.0–17.0)
Hemoglobin: 6.5 g/dL — CL (ref 13.0–17.0)
Hemoglobin: 7.7 g/dL — ABNORMAL LOW (ref 13.0–17.0)
MCH: 29.8 pg (ref 26.0–34.0)
MCH: 29.4 pg (ref 26.0–34.0)
MCH: 28.3 pg (ref 26.0–34.0)
MCHC: 28.6 g/dL — ABNORMAL LOW (ref 30.0–36.0)
MCHC: 28.1 g/dL — ABNORMAL LOW (ref 30.0–36.0)
MCHC: 28.6 g/dL — ABNORMAL LOW (ref 30.0–36.0)
MCV: 104 fL — ABNORMAL HIGH (ref 80.0–100.0)
MCV: 104.5 fL — ABNORMAL HIGH (ref 80.0–100.0)
MCV: 98.9 fL (ref 80.0–100.0)
Platelets: 197 10*3/uL (ref 150–400)
Platelets: 218 10*3/uL (ref 150–400)
Platelets: 204 10*3/uL (ref 150–400)
RBC: 2.25 MIL/uL — ABNORMAL LOW (ref 4.22–5.81)
RBC: 2.21 MIL/uL — ABNORMAL LOW (ref 4.22–5.81)
RBC: 2.72 MIL/uL — ABNORMAL LOW (ref 4.22–5.81)
RDW: 19.4 % — ABNORMAL HIGH (ref 11.5–15.5)
RDW: 19.3 % — ABNORMAL HIGH (ref 11.5–15.5)
RDW: 20.9 % — ABNORMAL HIGH (ref 11.5–15.5)
WBC: 17.5 10*3/uL — ABNORMAL HIGH (ref 4.0–10.5)
WBC: 20.2 10*3/uL — ABNORMAL HIGH (ref 4.0–10.5)
WBC: 13.1 10*3/uL — ABNORMAL HIGH (ref 4.0–10.5)
nRBC: 0.2 % (ref 0.0–0.2)
nRBC: 0.2 % (ref 0.0–0.2)
nRBC: 0.5 % — ABNORMAL HIGH (ref 0.0–0.2)

## 2021-03-13 LAB — PREPARE RBC (CROSSMATCH)

## 2021-03-13 LAB — BLOOD GAS, ARTERIAL
Acid-base deficit: 5.2 mmol/L — ABNORMAL HIGH (ref 0.0–2.0)
Bicarbonate: 20.7 mmol/L (ref 20.0–28.0)
Drawn by: 29503
MECHVT: 550 mL
O2 Saturation: 96.3 %
PEEP: 8 cmH2O
Patient temperature: 98.6
RATE: 20 resp/min
pCO2 arterial: 46.1 mmHg (ref 32.0–48.0)
pH, Arterial: 7.274 — ABNORMAL LOW (ref 7.350–7.450)
pO2, Arterial: 96.8 mmHg (ref 83.0–108.0)

## 2021-03-13 LAB — RPR: RPR Ser Ql: NONREACTIVE

## 2021-03-13 LAB — CRYPTOCOCCAL ANTIGEN, CSF: Crypto Ag: NEGATIVE

## 2021-03-13 LAB — BASIC METABOLIC PANEL
Anion gap: 11 (ref 5–15)
BUN: 100 mg/dL — ABNORMAL HIGH (ref 6–20)
CO2: 21 mmol/L — ABNORMAL LOW (ref 22–32)
Calcium: 8.6 mg/dL — ABNORMAL LOW (ref 8.9–10.3)
Chloride: 102 mmol/L (ref 98–111)
Creatinine, Ser: 4.24 mg/dL — ABNORMAL HIGH (ref 0.61–1.24)
GFR, Estimated: 17 mL/min — ABNORMAL LOW (ref >60)
Glucose, Bld: 147 mg/dL — ABNORMAL HIGH (ref 70–99)
Potassium: 6.1 mmol/L — ABNORMAL HIGH (ref 3.5–5.1)
Sodium: 134 mmol/L — ABNORMAL LOW (ref 135–145)

## 2021-03-13 LAB — MAGNESIUM: Magnesium: 2.1 mg/dL (ref 1.7–2.4)

## 2021-03-13 LAB — COMPREHENSIVE METABOLIC PANEL
ALT: 6 U/L (ref 0–44)
AST: 26 U/L (ref 15–41)
Albumin: 1.8 g/dL — ABNORMAL LOW (ref 3.5–5.0)
Alkaline Phosphatase: 91 U/L (ref 38–126)
Anion gap: 13 (ref 5–15)
BUN: 98 mg/dL — ABNORMAL HIGH (ref 6–20)
CO2: 21 mmol/L — ABNORMAL LOW (ref 22–32)
Calcium: 8.5 mg/dL — ABNORMAL LOW (ref 8.9–10.3)
Chloride: 101 mmol/L (ref 98–111)
Creatinine, Ser: 3.92 mg/dL — ABNORMAL HIGH (ref 0.61–1.24)
GFR, Estimated: 18 mL/min — ABNORMAL LOW (ref >60)
Glucose, Bld: 145 mg/dL — ABNORMAL HIGH (ref 70–99)
Potassium: 6 mmol/L — ABNORMAL HIGH (ref 3.5–5.1)
Sodium: 135 mmol/L (ref 135–145)
Total Bilirubin: 0.6 mg/dL (ref 0.3–1.2)
Total Protein: 6.7 g/dL (ref 6.5–8.1)

## 2021-03-13 LAB — CRYPTOCOCCAL ANTIGEN: Crypto Ag: NEGATIVE

## 2021-03-13 LAB — TOXOPLASMA ANTIBODIES- IGG AND  IGM
Toxoplasma Antibody- IgM: <3 AU/mL (ref 0.0–7.9)
Toxoplasma IgG Ratio: <3 IU/mL (ref 0.0–7.1)

## 2021-03-13 LAB — LACTIC ACID, PLASMA: Lactic Acid, Venous: 1.2 mmol/L (ref 0.5–1.9)

## 2021-03-13 LAB — PROTEIN, CSF: Total  Protein, CSF: 30 mg/dL (ref 15–45)

## 2021-03-13 LAB — CMV ANTIBODY, IGG (EIA): CMV Ab - IgG: <0.6 U/mL (ref 0.00–0.59)

## 2021-03-13 LAB — CREATININE, SERUM
Creatinine, Ser: 3.51 mg/dL — ABNORMAL HIGH (ref 0.61–1.24)
GFR, Estimated: 21 mL/min — ABNORMAL LOW (ref >60)

## 2021-03-13 LAB — CMV IGM: CMV IgM: <30 AU/mL (ref 0.0–29.9)

## 2021-03-13 LAB — GLUCOSE, CSF: Glucose, CSF: 91 mg/dL — ABNORMAL HIGH (ref 40–70)

## 2021-03-13 LAB — PROCALCITONIN: Procalcitonin: 3.86 ng/mL

## 2021-03-13 IMAGING — CT CT ABD-PELV W/O CM
3 of 5 series · 16 of 46 positions shown, 18 images · non-contrast
Comparison: [DATE]

CLINICAL DATA: Anemia, low hemoglobin, renal failure

EXAM:
CT ABDOMEN AND PELVIS WITHOUT CONTRAST
TECHNIQUE: Multidetector CT imaging of the abdomen and pelvis was performed
following the standard protocol without IV contrast.

[Series 3: axial st · axial · 0.88mm/px · z∈[+1147,+1582]mm · 10 of 107 slices shown, 12 images]
[im 10/107  soft-tissue]
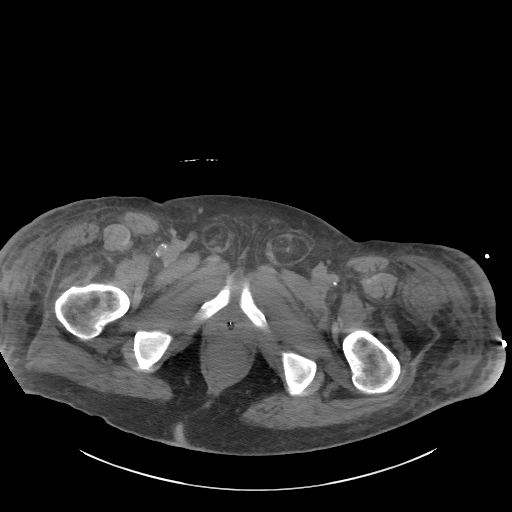
[im 10/107  bone]
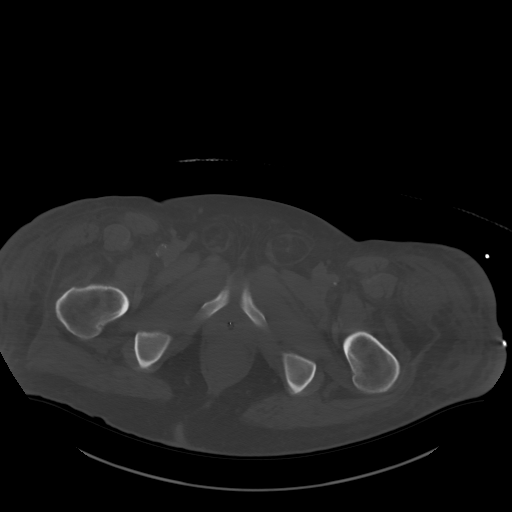
[im 20/107  soft-tissue]
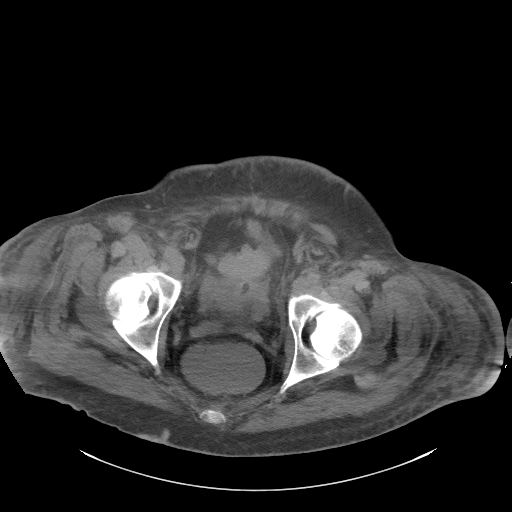
[im 29/107  soft-tissue]
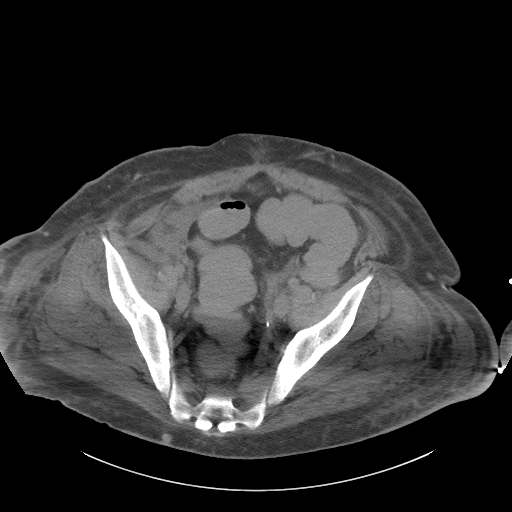
[im 39/107  soft-tissue]
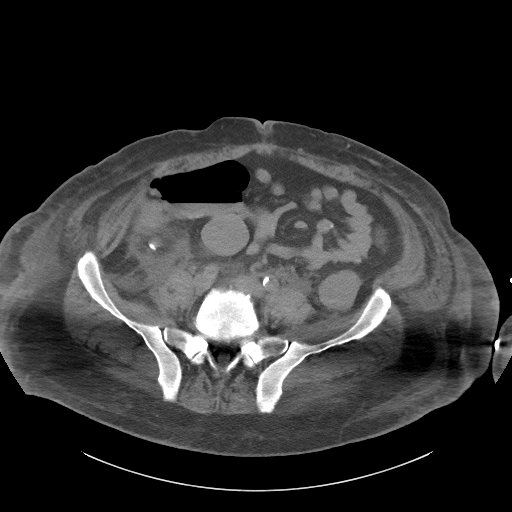
[im 49/107  soft-tissue]
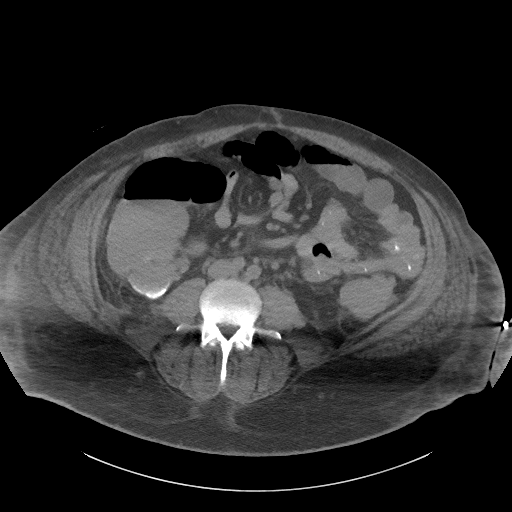
[im 58/107  soft-tissue]
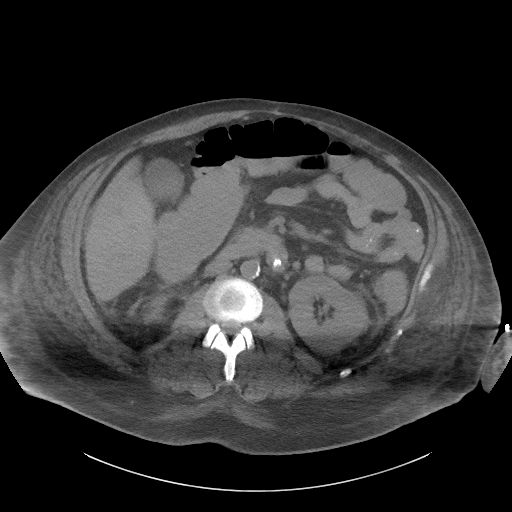
[im 68/107  soft-tissue]
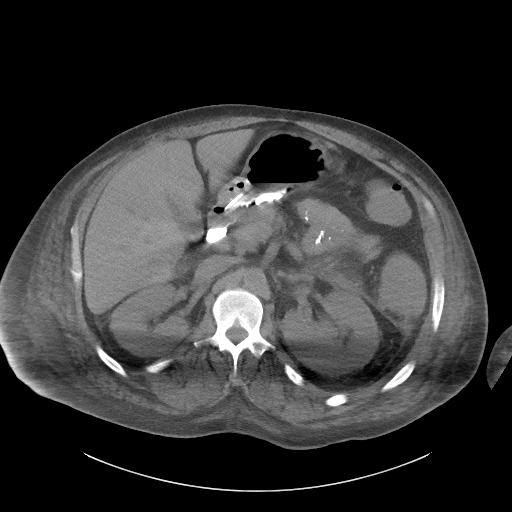
[im 78/107  soft-tissue]
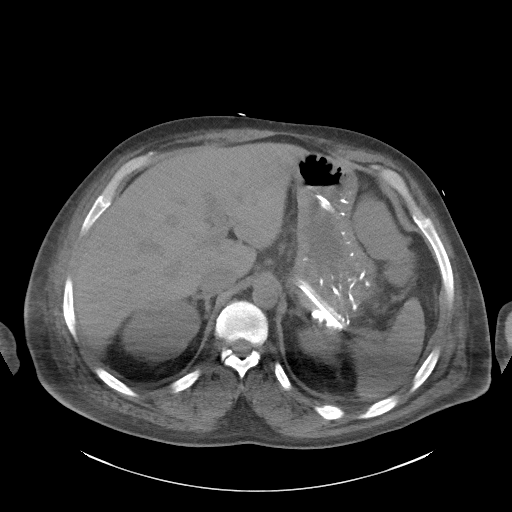
[im 87/107  soft-tissue]
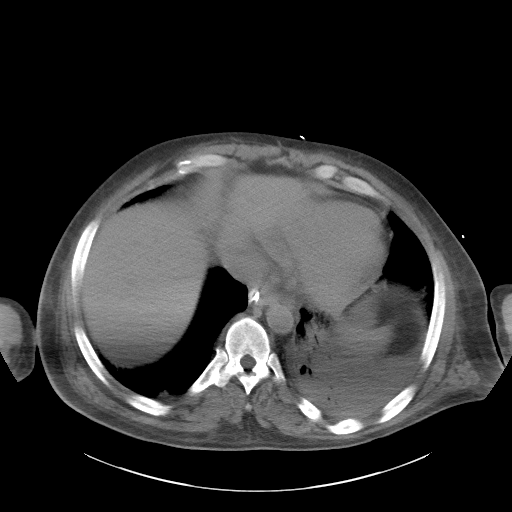
[im 87/107  bone]
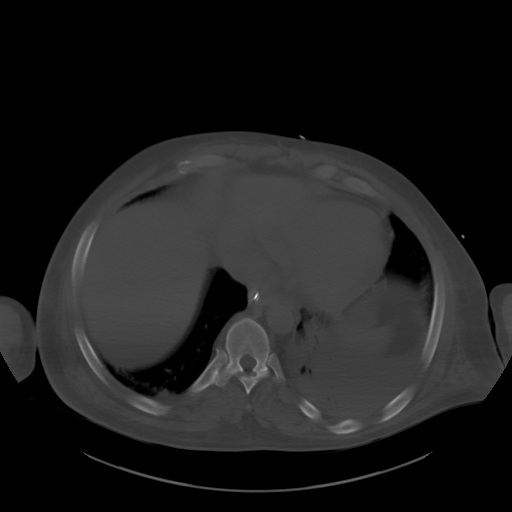
[im 97/107  soft-tissue]
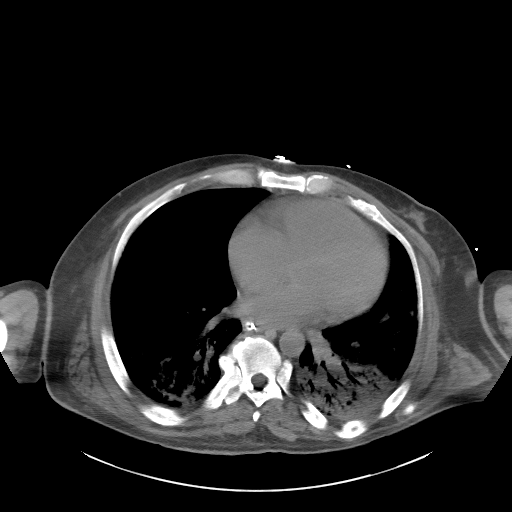

[Series 5: lung bases · axial · 0.88mm/px · z∈[+1488,+1528]mm · 3 of 83 slices shown]
[im 11/83  bone]
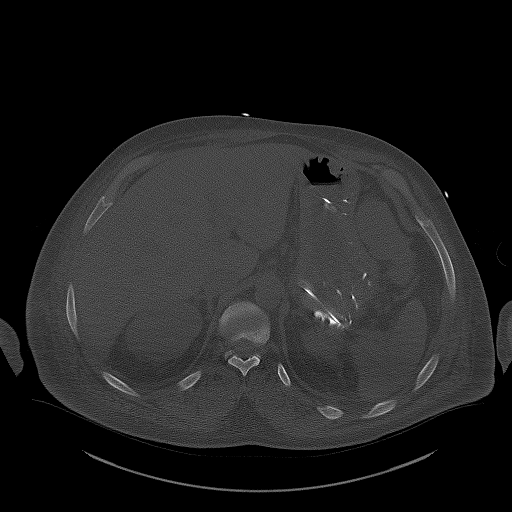
[im 21/83  bone]
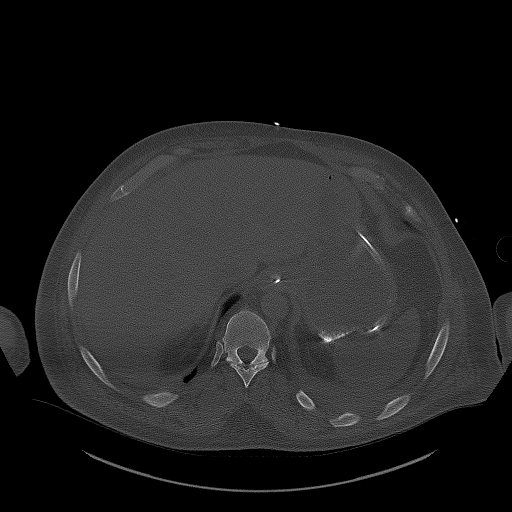
[im 31/83  bone]
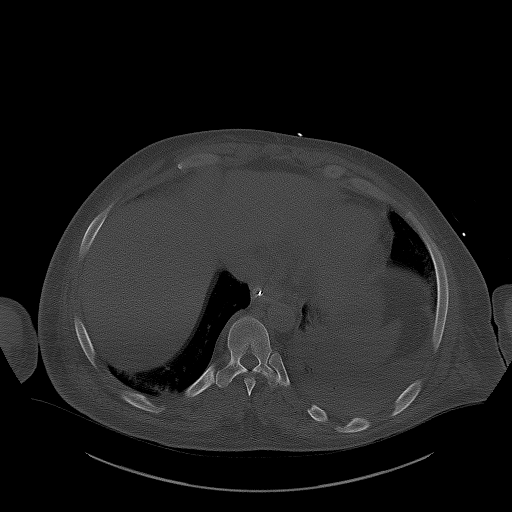

[Series 6: coronal st · coronal · 0.90mm/px · 3 of 101 slices shown]
[im 34/101  soft-tissue]
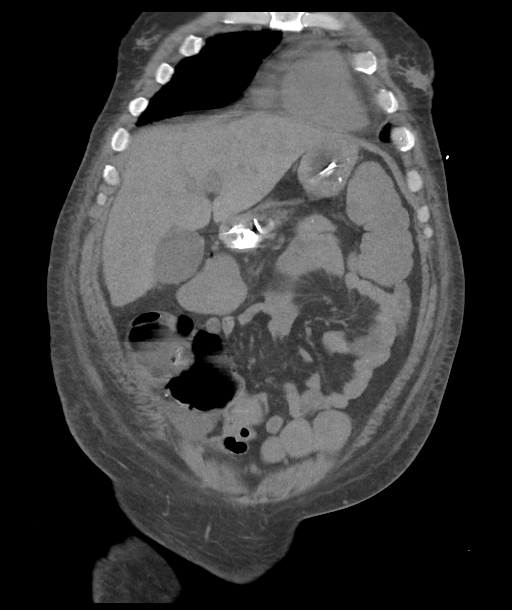
[im 45/101  soft-tissue]
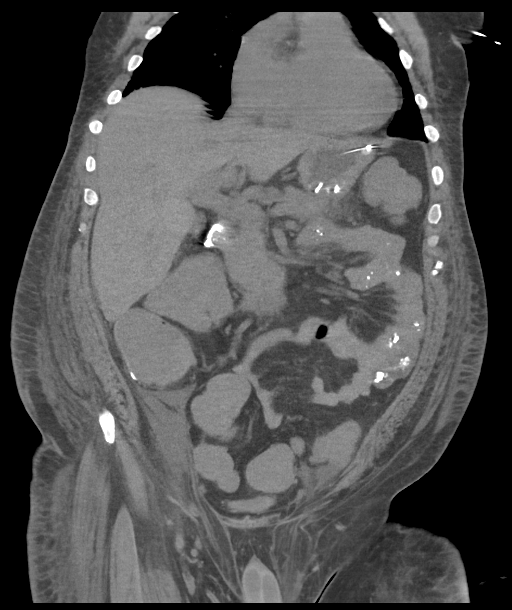
[im 56/101  soft-tissue]
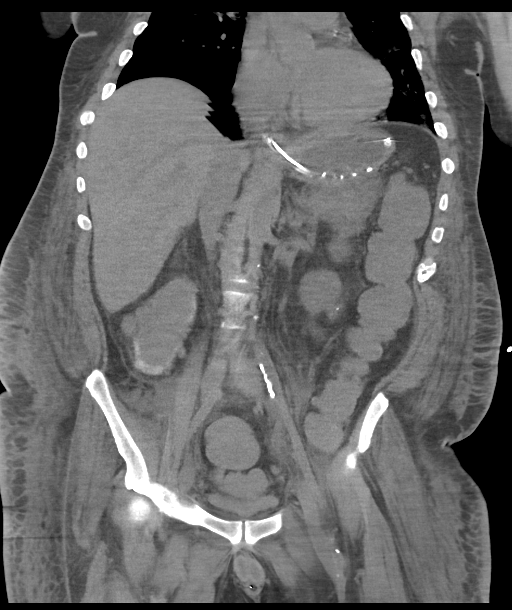

[16 of 46 positions shown; findings below may reference images not displayed]

FINDINGS: Lower chest: Extensive heterogeneous and consolidative airspace
opacity in the included bilateral lung bases with a small left
pleural effusion. Coronary artery calcifications.

Hepatobiliary: No solid liver abnormality is seen. Tiny gallstones
in the dependent gallbladder. Gallbladder wall thickening, or
biliary dilatation.

Pancreas: Unremarkable. No pancreatic ductal dilatation or
surrounding inflammatory changes.

Spleen: Normal in size without significant abnormality.

Adrenals/Urinary Tract: Adrenal glands are unremarkable. Kidneys are
normal, without renal calculi, solid lesion, or hydronephrosis.
Urinary bladder is decompressed by Foley catheter.

Stomach/Bowel: Stomach is within normal limits. Appendix is not
clearly visualized. No evidence of bowel wall thickening,
distention, or inflammatory changes. The colon is fluid-filled to
the rectum.

Vascular/Lymphatic: Aortic atherosclerosis. No enlarged abdominal or
pelvic lymph nodes.

Reproductive: No mass or other significant abnormality.

Other: Anasarca. There is intermediate attenuation (HU = 29) fluid
in the right lower quadrant, which appears to be extraperitoneal,
overlying the right psoas and external iliac artery (series 2, image
75).

Musculoskeletal: No acute or significant osseous findings.
IMPRESSION: 1. There is intermediate attenuation fluid in the right lower
quadrant, which appears to be extraperitoneal, overlying the right
psoas and external iliac artery. This is of uncertain etiology and
may reflect a small hematoma, particularly if there has been recent
vascular access in the right groin. The presence or absence of
active bleeding is generally not established by noncontrast CT.
2. Extensive heterogeneous and consolidative airspace opacity in the
included bilateral lung bases with a small left pleural effusion,
consistent with infection or aspiration.
3. Anasarca.
4. Cholelithiasis.
5. Coronary artery disease.

Aortic Atherosclerosis ([4W]-[4W]).

## 2021-03-13 IMAGING — DX DG CHEST 1V PORT
2 series · 2 of 2 positions shown · non-contrast
Comparison: [DATE]

CLINICAL DATA: Respiratory failure.  No pneumonia

EXAM:
PORTABLE CHEST 1 VIEW

[chest ap (1 of 2)]
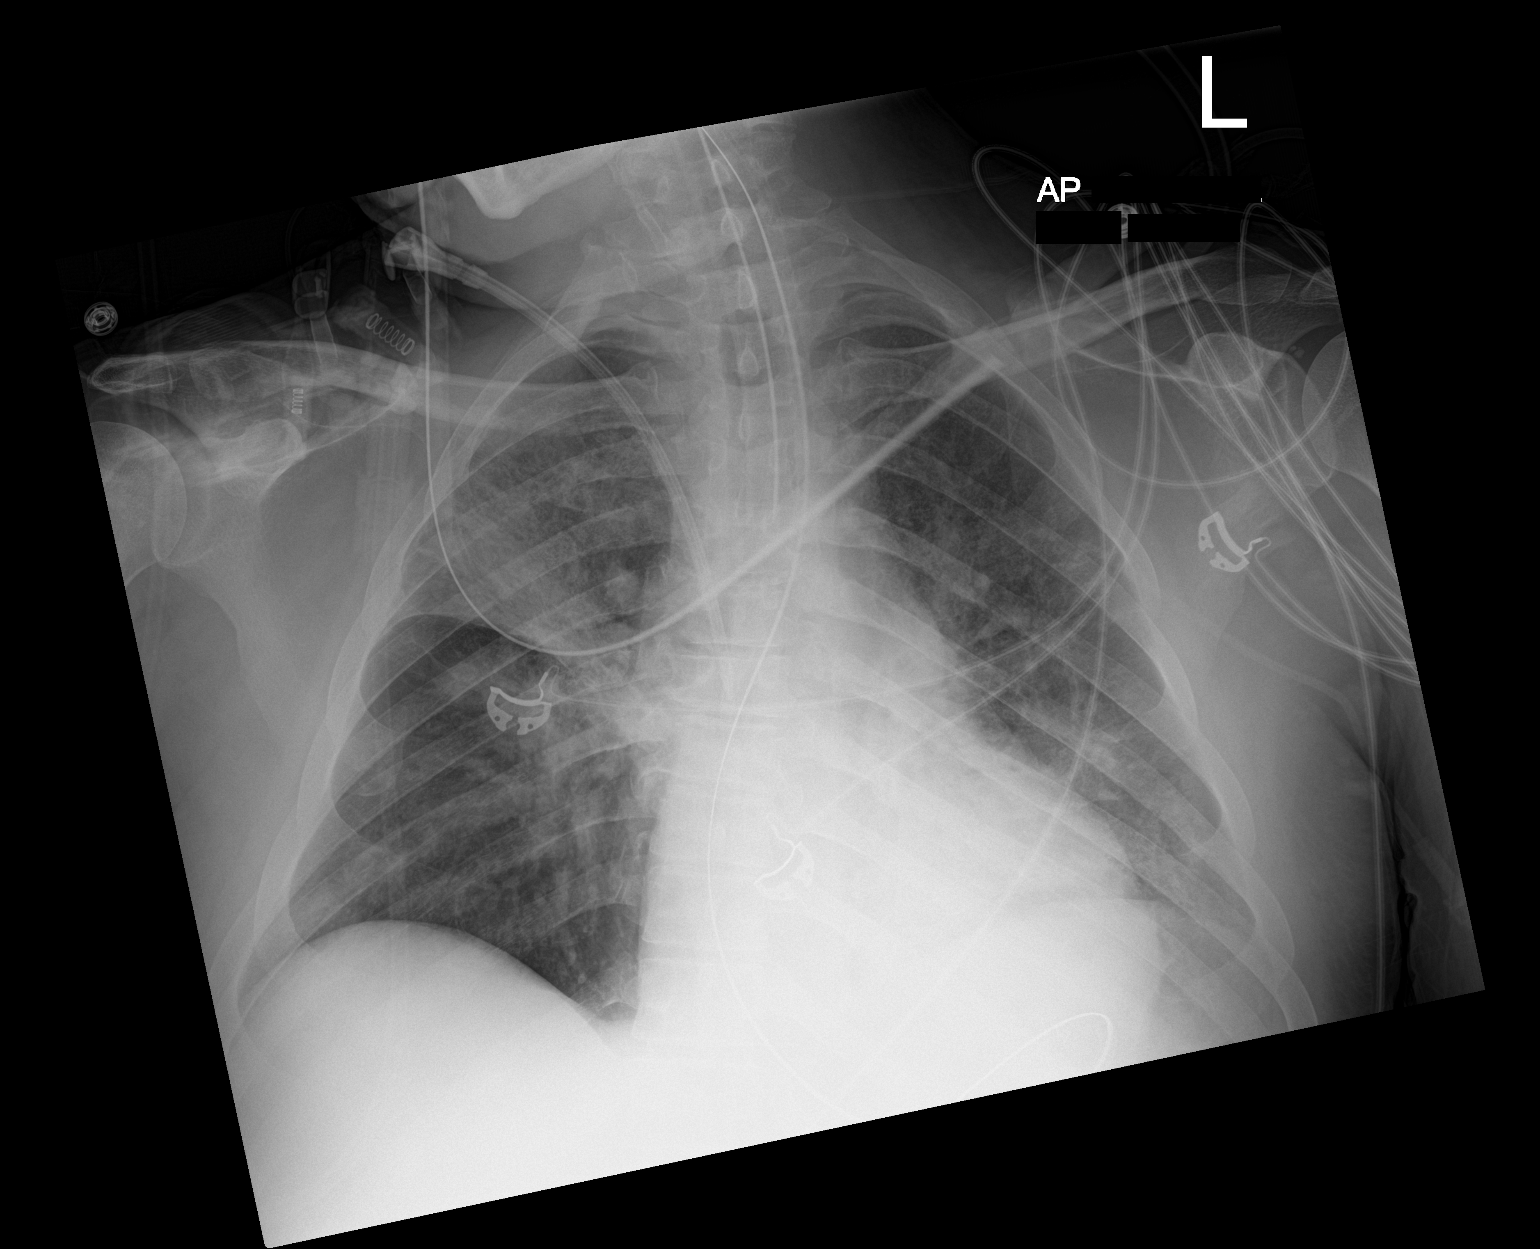

[chest ap (2 of 2)]
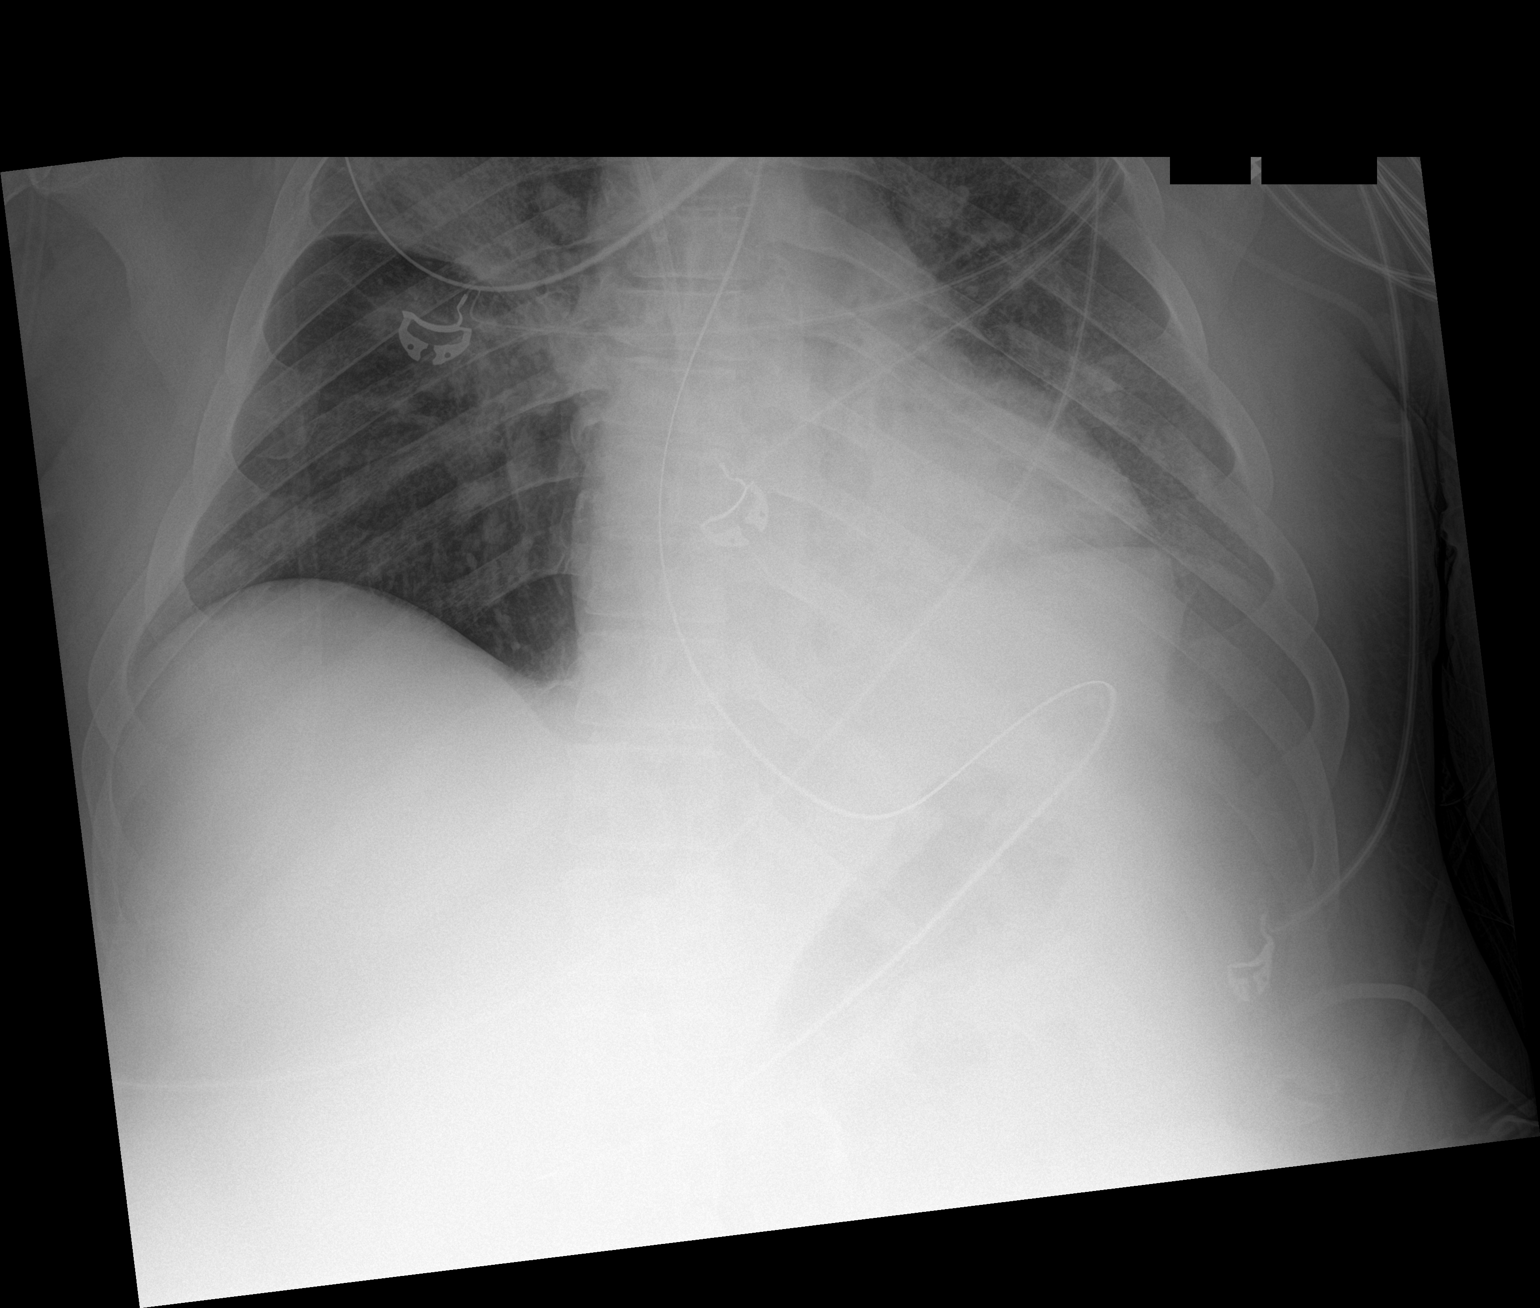

[2 of 2 positions shown; findings below may reference images not displayed]

FINDINGS: Endotracheal tube terminates 4.0 cm above carina. Nasogastric tube
extends beyond the inferior aspect of the film. Right internal
jugular line tip at mid to low SVC.

Normal heart size. No pleural effusion or pneumothorax. Multifocal
airspace disease, greater right than left. Relatively similar.
IMPRESSION: No significant change in multifocal bilateral pneumonia.

## 2021-03-13 IMAGING — RF DG FLUORO GUIDE SPINAL/SI JT INJ*L*
1 series · 1 of 1 positions shown · non-contrast
Comparison: Head CT dated [DATE]

CLINICAL DATA: Fever. Leukocytosis. Active HIV infection. Clinical
concern for meningitis. Unconscious.

EXAM:
DIAGNOSTIC LUMBAR PUNCTURE UNDER FLUOROSCOPIC GUIDANCE

[Series 1: cp_standard · 0.25mm/px · 1 of 1 slices shown]
[im 1/1]
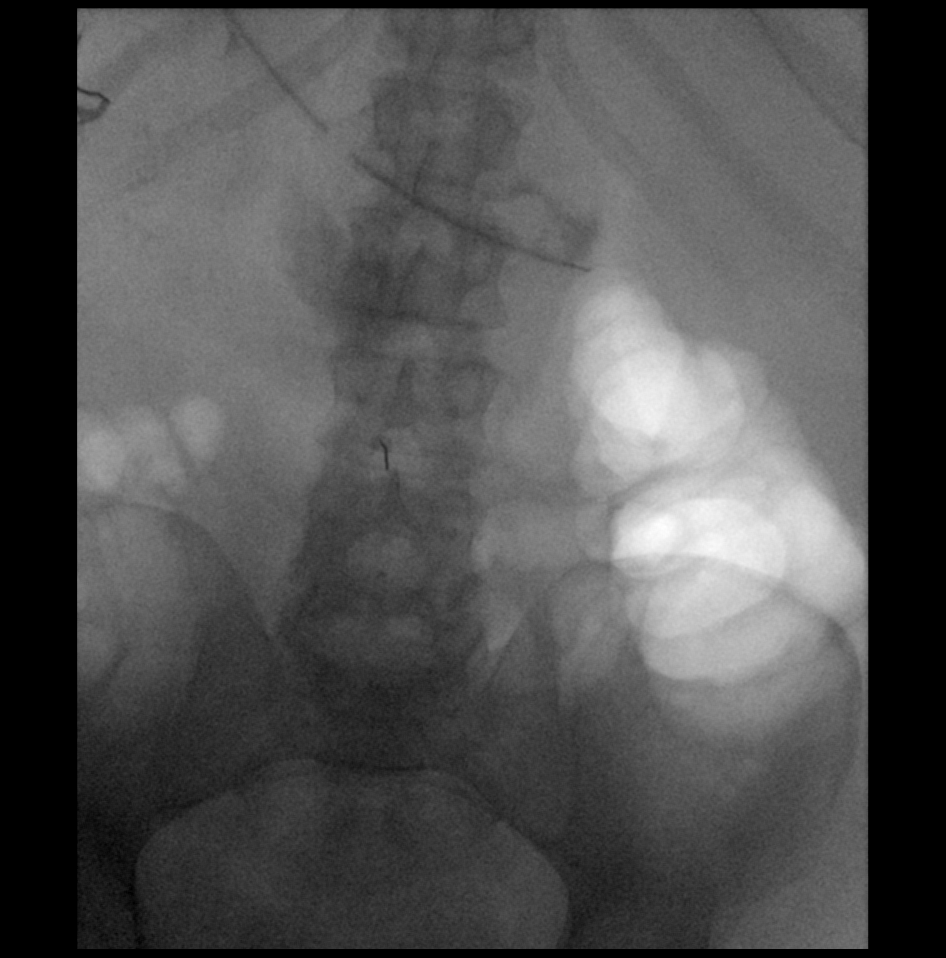

[1 of 1 positions shown; findings below may reference images not displayed]

FLUOROSCOPY TIME:  Fluoroscopy Time:  0 minutes 42 seconds

Radiation Exposure Index (if provided by the fluoroscopic device):
11.9 mGy

Number of Acquired Spot Images: 0

PROCEDURE:
Informed consent was obtained from the patient's mother prior to the
procedure, including potential complications of bleeding, infection
and nerve injury. With the patient prone, the lower back was prepped
with Betadine. 1% Lidocaine was used for local anesthesia. Lumbar
puncture was performed at the L3-4 level using an 18 gauge needle
with return of clear, colorless CSF with an opening pressure of 38
cm water. 14 ml of CSF were obtained for laboratory studies. A
closing pressure of 33 cm water was obtained. The patient tolerated
the procedure well and there were no apparent complications.
IMPRESSION: Successful fluoroscopic guided lumbar puncture, as described above.

## 2021-03-13 MED ORDER — NOREPINEPHRINE 16 MG/250ML-% IV SOLN
0.0000 ug/min | INTRAVENOUS | Status: DC
  Administered 2021-03-13: 10 ug/min via INTRAVENOUS
  Administered 2021-03-13: 23 ug/min via INTRAVENOUS
  Administered 2021-03-14: 22 ug/min via INTRAVENOUS
  Administered 2021-03-15: 11 ug/min via INTRAVENOUS
  Filled 2021-03-13 (×5): qty 250

## 2021-03-13 MED ORDER — PRISMASOL BGK 4/2.5 32-4-2.5 MEQ/L REPLACEMENT SOLN: Status: DC

## 2021-03-13 MED ORDER — PRISMASOL BGK 0/2.5 32-2.5 MEQ/L EC SOLN
Status: DC
  Filled 2021-03-13 (×4): qty 5000

## 2021-03-13 MED ORDER — HEPARIN BOLUS VIA INFUSION (CRRT)
1000.0000 [IU] | INTRAVENOUS | Status: DC | PRN
  Administered 2021-03-13 (×2): 1000 [IU] via INTRAVENOUS_CENTRAL
  Filled 2021-03-13: qty 1000

## 2021-03-13 MED ORDER — MIDAZOLAM HCL 2 MG/2ML IJ SOLN
1.0000 mg | INTRAMUSCULAR | Status: DC | PRN
Start: 2021-03-13 — End: 2021-03-15

## 2021-03-13 MED ORDER — HALOPERIDOL LACTATE 5 MG/ML IJ SOLN: 5.0000 mg | Freq: Four times a day (QID) | INTRAMUSCULAR | Status: DC | PRN

## 2021-03-13 MED ORDER — HEPARIN SODIUM (PORCINE) 1000 UNIT/ML DIALYSIS
1000.0000 [IU] | INTRAMUSCULAR | Status: DC | PRN
  Filled 2021-03-13 (×2): qty 6

## 2021-03-13 MED ORDER — FLUCONAZOLE IN SODIUM CHLORIDE 400-0.9 MG/200ML-% IV SOLN
400.0000 mg | INTRAVENOUS | Status: AC
Start: 2021-03-13 — End: 2021-03-19
  Administered 2021-03-13 – 2021-03-18 (×6): 400 mg via INTRAVENOUS
  Filled 2021-03-13 (×6): qty 200

## 2021-03-13 MED ORDER — DEXTROSE 50 % IV SOLN
INTRAVENOUS | Status: AC
  Administered 2021-03-13: 50 mL via INTRAVENOUS
  Filled 2021-03-13: qty 50

## 2021-03-13 MED ORDER — CHLORHEXIDINE GLUCONATE CLOTH 2 % EX PADS
6.0000 | MEDICATED_PAD | Freq: Every day | CUTANEOUS | Status: DC
  Administered 2021-03-14 – 2021-03-30 (×15): 6 via TOPICAL

## 2021-03-13 MED ORDER — SODIUM CHLORIDE 0.9% IV SOLUTION: Freq: Once | INTRAVENOUS | Status: AC

## 2021-03-13 MED ORDER — DEXTROSE 5 % IV SOLN: 900.0000 mg | INTRAVENOUS | Status: DC

## 2021-03-13 MED ORDER — PHENYLEPHRINE CONCENTRATED 100MG/250ML (0.4 MG/ML) INFUSION SIMPLE
0.0000 ug/min | INTRAVENOUS | Status: DC
  Filled 2021-03-13: qty 250

## 2021-03-13 MED ORDER — PHENYLEPHRINE HCL-NACL 10-0.9 MG/250ML-% IV SOLN
0.0000 ug/min | INTRAVENOUS | Status: DC
  Administered 2021-03-13: 20 ug/min via INTRAVENOUS
  Filled 2021-03-13 (×2): qty 250

## 2021-03-13 MED ORDER — PRISMASOL BGK 4/2.5 32-4-2.5 MEQ/L EC SOLN: Status: DC

## 2021-03-13 MED ORDER — SODIUM ZIRCONIUM CYCLOSILICATE 10 G PO PACK
10.0000 g | PACK | Freq: Once | ORAL | Status: AC
  Administered 2021-03-13: 10 g
  Filled 2021-03-13: qty 1

## 2021-03-13 MED ORDER — DEXTROSE 5 % IV SOLN
900.0000 mg | Freq: Two times a day (BID) | INTRAVENOUS | Status: DC
  Administered 2021-03-13 – 2021-03-14 (×2): 900 mg via INTRAVENOUS
  Filled 2021-03-13 (×4): qty 18

## 2021-03-13 MED ORDER — PRISMASOL BGK 0/2.5 32-2.5 MEQ/L EC SOLN
Status: DC
  Filled 2021-03-13 (×10): qty 5000

## 2021-03-13 MED ORDER — DEXTROSE 50 % IV SOLN: 1.0000 | Freq: Once | INTRAVENOUS | Status: AC

## 2021-03-13 MED ORDER — SODIUM CHLORIDE 0.9 % IV SOLN
250.0000 [IU]/h | INTRAVENOUS | Status: DC
  Administered 2021-03-13: 350 [IU]/h via INTRAVENOUS_CENTRAL
  Administered 2021-03-14: 1300 [IU]/h via INTRAVENOUS_CENTRAL
  Administered 2021-03-14: 1800 [IU]/h via INTRAVENOUS_CENTRAL
  Administered 2021-03-14: 950 [IU]/h via INTRAVENOUS_CENTRAL
  Administered 2021-03-15 (×2): 1850 [IU]/h via INTRAVENOUS_CENTRAL
  Administered 2021-03-15: 1950 [IU]/h via INTRAVENOUS_CENTRAL
  Administered 2021-03-15: 1850 [IU]/h via INTRAVENOUS_CENTRAL
  Administered 2021-03-16 – 2021-03-18 (×11): 1950 [IU]/h via INTRAVENOUS_CENTRAL
  Administered 2021-03-18 – 2021-03-19 (×4): 1500 [IU]/h via INTRAVENOUS_CENTRAL
  Filled 2021-03-13 (×4): qty 10000
  Filled 2021-03-13: qty 2
  Filled 2021-03-13 (×2): qty 10000
  Filled 2021-03-13: qty 2
  Filled 2021-03-13 (×2): qty 10000
  Filled 2021-03-13: qty 2
  Filled 2021-03-13 (×8): qty 10000
  Filled 2021-03-13: qty 2
  Filled 2021-03-13: qty 10000
  Filled 2021-03-13: qty 2
  Filled 2021-03-13 (×2): qty 10000

## 2021-03-13 MED ORDER — PRISMASOL BGK 0/2.5 32-2.5 MEQ/L EC SOLN
Status: DC
  Filled 2021-03-13 (×3): qty 5000

## 2021-03-13 MED ORDER — NOREPINEPHRINE 4 MG/250ML-% IV SOLN: 0.0000 ug/min | INTRAVENOUS | Status: DC

## 2021-03-13 MED ORDER — NOREPINEPHRINE 4 MG/250ML-% IV SOLN
INTRAVENOUS | Status: AC
  Administered 2021-03-13: 2 ug/min via INTRAVENOUS
  Filled 2021-03-13: qty 250

## 2021-03-13 MED ORDER — MIDAZOLAM 50MG/50ML (1MG/ML) PREMIX INFUSION
0.5000 mg/h | INTRAVENOUS | Status: DC
  Administered 2021-03-13: 0.5 mg/h via INTRAVENOUS
  Administered 2021-03-13 – 2021-03-14 (×2): 3 mg/h via INTRAVENOUS
  Administered 2021-03-15: 6 mg/h via INTRAVENOUS
  Filled 2021-03-13 (×4): qty 50

## 2021-03-13 NOTE — Progress Notes (Signed)
Chaplain paged for family support as patient was going for a procedure.  Chaplain arrived and patient in Radiology.  Chaplain provided support for patient's mother, Hassan Rowan and her sister Shauna Hugh.  They shared some of the patient's journey that led to this point.  Mother worried, but strong faith and hopeful patient can get some better.  Patient has a brother and "adopted"sister and lots of family holding patient in prayer.  Chaplain provided ministry of presence, reflective listening and built rapport.  Patient returned and Chaplain gathered family at the bedside and offered prayer.  Chaplain available as needed  [place], [place].    03/13/21 1651  Clinical Encounter Type  Visited With Patient and family together  Visit Type Spiritual support  Referral From Family;Nurse  Consult/Referral To Chaplain  Spiritual Encounters  Spiritual Needs Prayer  Stress Factors  Family Stress Factors Health changes

## 2021-03-13 NOTE — Progress Notes (Signed)
Called by RN. Issues with access flow. Low flow rates now. Will try adding heparin through circuit. If hgb drops then please stop heparin through circuit. If no improvement with catheter then will need a new line.  Gean Quint, MD [facility]

## 2021-03-13 NOTE — Plan of Care (Signed)
Mother update in front of patient about plan of care for the evening, pain management and CRRT restarting with some teach back displayed by mother.  Problem: Education: Goal: Knowledge of General Education information will improve Description: Including pain rating scale, medication(s)/side effects and non-pharmacologic comfort measures Outcome: Progressing   Problem: Health Behavior/Discharge Planning: Goal: Ability to manage health-related needs will improve Outcome: Progressing

## 2021-03-13 NOTE — Progress Notes (Signed)
Ceribell still attached to patient to begin eeg monitoring with all leads green. Will continue to monitor patient at this time and call number in the morning.

## 2021-03-13 NOTE — Progress Notes (Signed)
Nephrology Follow-Up Consult note   Assessment/Recommendations: Bruce Hunter is a/an 45 y.o. male with a past medical history significant for HIV/AIDS, CKD, asthma, obesity, DM2 admitted for acute hypoxic respiratory failure with sepsis.      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  CKD with baseline of 2.5-3 (thus expected to rise at least to same).  Did have some urinary retention now with Foley catheter.   - Will resume CRRT - worsening clearance.  UF goal keep even to start as tolerated - Agree with Foley catheter given urinary retention - would reduce gabapentin to a max of 300 mg daily   Acute hypoxic respiratory failure: Related to pneumonia and possibly some volume overload.  Optimize volume as above and treatment of pneumonia per primary team   Septic shock: Antibiotics per primary team  Pneumonia: Concern for PJP.  Antibiotics and management per primary  HIV: ID managing  Hypertension: History of such.  Holding home blood pressure medications given hx of septic shock  DM2: Insulin management per primary  Anemia macrocytic. Likely multifactorial with infection contributing. Agree with PRBC's today  ___________________________________________________________   Interval History/Subjective: had 1.3 liters UOP over 7/8. Off of CRRT on 7/6.  Critical care requesting CRRT again.  About an hour ago he had a mucous plugging event and pressures dropped to 27'X systolic.  He went from no pressors to norepi now at 5 mcg/min and phenylephrine at 110 mcg/min.  I spoke with his mother via phone to update her and she thanked the team for their care of her son  Review of systems: Unable to obtain 2/2 AMS   Medications:  Current Facility-Administered Medications  Medication Dose Route Frequency Provider Last Rate Last Admin   0.9 %  sodium chloride infusion (Manually program via Guardrails IV Fluids)   Intravenous Once Brand Males, MD        0.9 %  sodium chloride infusion  250 mL Intravenous Continuous Candee Furbish, MD 10 mL/hr at 03/10/21 0111 Infusion Verify at 03/10/21 0111   0.9 %  sodium chloride infusion  250 mL Intravenous Continuous Anders Simmonds, MD 10 mL/hr at 03/13/21 0900 Infusion Verify at 03/13/21 0900   acetaminophen (TYLENOL) 160 MG/5ML solution 650 mg  650 mg Per Tube Q6H PRN Tanda Rockers, MD   650 mg at 03/13/21 0954   acyclovir (ZOVIRAX) 900 mg in dextrose 5 % 150 mL IVPB  900 mg Intravenous Q12H Wofford, Drew A, RPH       albuterol (PROVENTIL) (2.5 MG/3ML) 0.083% nebulizer solution 2.5 mg  2.5 mg Nebulization Q2H PRN Collier Bullock, MD       alteplase (CATHFLO ACTIVASE) injection 2 mg  2 mg Intracatheter Once Tanda Rockers, MD       alteplase (CATHFLO ACTIVASE) injection 2 mg  2 mg Intracatheter Once Roney Jaffe, MD       cefTRIAXone (ROCEPHIN) 2 g in sodium chloride 0.9 % 100 mL IVPB  2 g Intravenous Q12H Corey Harold, NP   Stopped at 03/12/21 2258   chlorhexidine gluconate (MEDLINE KIT) (PERIDEX) 0.12 % solution 15 mL  15 mL Mouth Rinse BID Candee Furbish, MD   15 mL at 03/13/21 0750   Chlorhexidine Gluconate Cloth 2 % PADS 6 each  6 each Topical Daily Candee Furbish, MD   6 each at 03/13/21 0655   clindamycin (CLEOCIN) IVPB 900 mg  900 mg Intravenous Q8H Carlyle Basques, MD  Stopped at 03/13/21 0706   clonazePAM (KLONOPIN) tablet 1 mg  1 mg Per Tube BID Hunsucker, Bonna Gains, MD   1 mg at 03/13/21 1114   dexmedetomidine (PRECEDEX) 200 MCG/50ML (4 mcg/mL) infusion  0.4-1.2 mcg/kg/hr Intravenous Titrated Frederik Pear, MD 19 mL/hr at 03/13/21 1112 0.8 mcg/kg/hr at 03/13/21 1112   docusate (COLACE) 50 MG/5ML liquid 100 mg  100 mg Per Tube BID PRN Tanda Rockers, MD   100 mg at 03/08/21 1358   dolutegravir (TIVICAY) tablet 50 mg  50 mg Per Tube Daily Tanda Rockers, MD   50 mg at 03/13/21 1115   emtricitabine-tenofovir AF (DESCOVY) 200-25 MG per tablet 1 tablet  1 tablet Per Tube Daily  Carlyle Basques, MD   1 tablet at 03/13/21 1115   feeding supplement (PIVOT 1.5 CAL) liquid 1,000 mL  1,000 mL Per Tube Q24H Hunsucker, Bonna Gains, MD   Held at 03/13/21 0005   fentaNYL (SUBLIMAZE) bolus via infusion 200 mcg  200 mcg Intravenous Q1H PRN Hunsucker, Bonna Gains, MD   200 mcg at 03/12/21 1514   fentaNYL 2569mcg in NS 210mL (3mcg/ml) infusion-PREMIX  0-400 mcg/hr Intravenous Continuous Tyna Jaksch, MD 40 mL/hr at 03/13/21 0900 400 mcg/hr at 03/13/21 0900   fluconazole (DIFLUCAN) IVPB 400 mg  400 mg Intravenous Q24H Wofford, Drew A, RPH       gabapentin (NEURONTIN) 250 MG/5ML solution 300 mg  300 mg Per Tube Q8H Tanda Rockers, MD   300 mg at 03/13/21 2130   haloperidol lactate (HALDOL) injection 5 mg  5 mg Intravenous Q6H PRN Brand Males, MD       heparin injection 5,000 Units  5,000 Units Subcutaneous Q8H Collier Bullock, MD   5,000 Units at 03/12/21 1440   insulin aspart (novoLOG) injection 0-15 Units  0-15 Units Subcutaneous Q4H Ollis, Brandi L, NP   2 Units at 03/13/21 0400   ipratropium-albuterol (DUONEB) 0.5-2.5 (3) MG/3ML nebulizer solution 3 mL  3 mL Nebulization Q4H Tyna Jaksch, MD   3 mL at 03/13/21 0749   lactated ringers infusion   Intravenous Continuous Polly Cobia, RPH   Paused at 03/13/21 8657   MEDLINE mouth rinse  15 mL Mouth Rinse 10 times per day Candee Furbish, MD   15 mL at 03/13/21 0645   midazolam (VERSED) 50 mg/50 mL (1 mg/mL) premix infusion  0.5-10 mg/hr Intravenous Continuous Brand Males, MD 0.5 mL/hr at 03/13/21 0952 0.5 mg/hr at 03/13/21 0952   midazolam (VERSED) injection 1-4 mg  1-4 mg Intravenous Q2H PRN Brand Males, MD       midazolam (VERSED) injection 2 mg  2 mg Intravenous Q2H PRN Mauri Brooklyn, MD       norepinephrine (LEVOPHED) 4mg  in 212mL premix infusion  0-40 mcg/min Intravenous Titrated Brand Males, MD 7.5 mL/hr at 03/13/21 1041 2 mcg/min at 03/13/21 1041   ondansetron (ZOFRAN) injection 4 mg  4 mg  Intravenous Q6H PRN Collier Bullock, MD       oxyCODONE (ROXICODONE) 5 MG/5ML solution 5 mg  5 mg Per Tube Q6H Corey Harold, NP   5 mg at 03/13/21 1114   pantoprazole sodium (PROTONIX) 40 mg/20 mL oral suspension 40 mg  40 mg Per Tube QHS Leodis Sias T, RPH   40 mg at 03/12/21 2218   phenylephrine (NEOSYNEPHRINE) 10-0.9 MG/250ML-% infusion  0-400 mcg/min Intravenous Titrated Brand Males, MD 30 mL/hr at 03/13/21 1035 20 mcg/min at 03/13/21 1035   polyethylene glycol (MIRALAX /  GLYCOLAX) packet 17 g  17 g Per Tube Daily PRN Tanda Rockers, MD       primaquine tablet 30 mg  30 mg Oral Daily Carlyle Basques, MD   30 mg at 03/13/21 1116   sodium chloride flush (NS) 0.9 % injection 10-40 mL  10-40 mL Intracatheter Q12H Tanda Rockers, MD   20 mL at 03/12/21 2231   sodium chloride flush (NS) 0.9 % injection 10-40 mL  10-40 mL Intracatheter PRN Tanda Rockers, MD         Physical Exam: Vitals:   03/13/21 0800 03/13/21 0900  BP: (!) 98/32 (!) 97/38  Pulse: (!) 118 (!) 129  Resp: 18 (!) 21  Temp: (!) 100.76 F (38.2 C) (!) 100.94 F (38.3 C)  SpO2: 100% 96%   Total I/O In: 1082.5 [I.V.:1071.9; IV Piggyback:10.7] Out: 20 [Urine:20]  Intake/Output Summary (Last 24 hours) at 03/13/2021 1126 Last data filed at 03/13/2021 0900 Gross per 24 hour  Intake 5846.6 ml  Output 935 ml  Net 4911.6 ml   General adult male in bed intubated  HEENT normocephalic atraumatic  Neck supple trachea midline Lungs coarse mechanical breath sounds; FIO2 60 and PEEP 8 Heart S1S2 no rub Abdomen soft nontender with limits of sedation distended Extremities no edema  Neuro sedation running Access: RIJ nontunneled catheter in place   Test Results I personally reviewed new and old clinical labs and radiology tests Lab Results  Component Value Date   NA 137 03/13/2021   K 5.3 (H) 03/13/2021   CL 102 03/13/2021   CO2 23 03/13/2021   BUN 98 (H) 03/13/2021   CREATININE 3.62 (H) 03/13/2021    CREATININE 3.51 (H) 03/13/2021   CALCIUM 8.4 (L) 03/13/2021   ALBUMIN 1.8 (L) 03/13/2021   PHOS 7.8 (H) 03/13/2021     Claudia Desanctis, MD 03/13/2021 11:39 AM

## 2021-03-13 NOTE — Progress Notes (Addendum)
Pharmacy Antibiotic Note  Bruce Hunter is a 45 y.o. HIV+ male with CKD3 admitted on 03/04/2021 with respiratory failure and currently on treatment for MSSA PNA. Today noted to have new fevers, worsening leukocytosis, and worsening AMS. Pharmacy consulted to begin vancomycin + acyclovir for CNS coverage. Of note, patient with AoCKD on admission; just recently able to wean from CRRT and resolve volume overload.  Today, 03/13/2021: D2 acyclovir (see below for other abx) Still with low-grade fevers this AM WBC elevated but seems to have peaked yesterday; slightly down today SCr trending up again with CRRT 7/6; planning to resume today   Plan: Continue Acyclovir 10 mg/kg q12 while on CRRT Lactated ringers @ 75 ml/hr while on acyclovir - should still help protect kidneys despite CRRT running, but can stop if fluid status becomes an issue Continue Rocephin & clindamycin as ordered per MD; dosing appropriate for CRRT Continues on Diflucan, Primaquine, and HART per MD; will adjust Diflucan to 400 mg IV daily with CRRT resumed  Height: '5\' 11"'$  (180.3 cm) Weight: 96.7 kg (213 lb 3 oz) IBW/kg (Calculated) : 75.3  Temp (24hrs), Avg:100.1 F (37.8 C), Min:99.5 F (37.5 C), Max:100.94 F (38.3 C)  Recent Labs  Lab 03/08/21 0613 03/08/21 1612 03/10/21 0408 03/10/21 2239 03/11/21 0520 03/12/21 0501 03/13/21 0405 03/13/21 0730  WBC 11.2*  --   --  16.7* 15.1* 19.1*  --  17.5*  CREATININE 2.51*   < > 1.58* 1.73* 1.90* 2.56* 3.62*  3.51*  --    < > = values in this interval not displayed.     Estimated Creatinine Clearance: 30.9 mL/min (A) (by C-G formula based on SCr of 3.62 mg/dL (H)).    No Known Allergies  Antimicrobials this admission: 6/30 Cefepime >> 7/5 6/30 Vancomycin >> 7/2; resume vanc 7/8 >> 7/8 6/30 CTX & Azith x 1 6/30 Clinda >> 7/5, resume 7/8 7/1 primaquine 30 q24 >>7/5, resume 7/8 >> 7/1 fluconazole (from PTA) >>  7/1 Tivicay/epivir >> 7/5 Cefazolin >> 7/8 7/5  Dapsone >> 7/8 7/8 Rocephin >>  7/8 Acyclovir >>  Dose adjustments this admission:  7/2 Cefepime 2q q24 > q12 & Fluc '200mg'$  q24 > '400mg'$  q24 CRRT 7/6 Ancef 2gm q12 > q8 & Fluconazole to '200mg'$  qd off CRRT 7/9 Diflucan 200 mg >> 400 mg/d with CRRT restart, keep acyclovir q12  Microbiology results: 6/30 strep pneumo neg 6/30 covid/flu neg 7/1 AFB: not collected 6/30 BCx2: ng-final 7/1 MRSA neg 7/1 fungitell: >500 (can also be + with PCP or Crypto) 7/1 GI panel:neg 7/1 legionella: neg 7/1 resp panel:neg 7/2 Pneumocystis smear: sent, see neg DFA at [facility] 7/2 TrachAsp: MSSA, (R-TET only) 7/8 Cryptococcal Ag: IP  Thank you for allowing pharmacy to be a part of this patient's care.  Bruce Hunter A 03/13/2021 9:44 AM

## 2021-03-13 NOTE — Progress Notes (Signed)
PM update    On levophed 58mg oN vent 50% Renal has asked for CRRT LP pending - 3pm - IR apparently wants safet for LP esp with im being prine Smear possible shistocytes  plan - CT abd/pelvis wo contrast  rule out RP bleed -> then LP (if no RP bleed) -> then CRRT - 1 u PRBC stat (awaited)  - recheck bmet and LFT - recheck smear for schistocytes and LFT   Additional 20 min ccm time     SIGNATURE    Dr. MBrand Males M.D., F.C.C.P,  Pulmonary and Critical Care Medicine Staff Physician, CBaileyDirector - Interstitial Lung Disease  Program  [place]ulmonary [place] [address] Pager: [phone] If no answer  OR between  19:00-7:00h: page [phone]  [phone] (clinical office): [phone] Telephone (research): [phone]  1:08 PM 03/13/2021    Recent Labs  Lab 03/12/21 0501 03/13/21 0730 03/13/21 0943  HGB 8.5* 6.7* 6.5*  HCT 29.4* 23.4* 23.1*  WBC 19.1* 17.5* 20.2*  PLT 227 197 218  202   Recent Labs  Lab 03/09/21 1637 03/10/21 0408 03/10/21 2239 03/11/21 0520 03/12/21 0501 03/13/21 0405  NA 137 137 140 140 139 137  K 4.1 4.3 3.9 3.8 4.6 5.3*  CL 105 103 104 106 105 102  CO2 '25 26 28 28 24 23  '$ GLUCOSE 137* 227* 158* 161* 154* 103*  BUN 43* 42* 59* 62* 85* 98*  CREATININE 1.69* 1.58* 1.73* 1.90* 2.56* 3.62*  3.51*  CALCIUM 8.1* 8.2* 8.4* 8.4* 8.3* 8.4*  MG  --  2.4 2.1 2.2 2.0 2.1  PHOS 3.5 2.9  --  4.8*  4.7* 5.8* 7.8*   Recent Labs  Lab 03/10/21 0408 03/10/21 2239 03/11/21 0520 03/12/21 0501 03/13/21 0405 03/13/21 0943  AST  --  23 23  --   --   --   ALT  --  17 17  --   --   --   ALKPHOS  --  65 69  --   --   --   BILITOT  --  0.5 0.4  --   --   --   PROT  --  6.7 6.3*  --   --   --   ALBUMIN 2.3* 2.0* 1.9*  1.9* 2.1* 1.8*  --   INR  --   --   --   --   --  1.1   Recent Labs  Lab 03/13/21 0914 03/13/21 0915  LATICACIDVEN 1.2  --    PROCALCITON  --  3.86

## 2021-03-13 NOTE — Progress Notes (Signed)
NAME:  Bruce Hunter, MRN:  [id number], DOB:  September 18, 1975, LOS: 9 ADMISSION DATE:  03/04/2021, CONSULTATION DATE:  03/04/21 REFERRING MD:  Melina Copa, CHIEF COMPLAINT:  Shortness of breath.     Brief Narrative:  45 yo man with HIV/AIDS here with acute hypoxemic respiratory failure.  Complaints of SOB and cough productive of tan sputum.  Has had respiratory symptoms since he was discharged earlier this month (6/13).     Recently diagnosed with HIV/AIDS, started on medications, but patient tells me he "hasn't had time to start taking them" but he isn't really able to explain further.  Patient was initiated on lamivudine, Tivicay, dapsone and fluconazole on discharge.  Patient does have an appointment with infectious disease as outpatient.  Candida esophagitis. At the time.    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 Mid June 2022 CD4 count is <35 and initial viral load of 2.7 million entering care at Stage 3. Genotype remains pending. Treated for candida esophagitis on entry to care. [id number] negative.  Initially started on Dovato and Dapsone. DTG plus lamivudine. He can get the combined pill, dovato, upon discharge. Can send rx for dovato for free 30 day supply Esophageal candidiasis = recommend to treat with fluconazole '400mg'$  po daily x 14-21 days Opportunistic infection proph = continue on dapsone '100mg'$  daily   Also baseline  - polyphamracy and neuropathic pain from HIV. DM. Also Marijuana usr   has a past medical history of Asthma, Diabetes mellitus without complication ([place]), Gout, Hyperlipidemia, Hypertension, and Obesity.   has a past surgical history that includes No past surgeries; Ganglion cyst excision (Right, 02/26/2020); Esophagogastroduodenoscopy (egd) with propofol (N/A, 02/15/2021); Colonoscopy with propofol (N/A, 02/15/2021); and biopsy (02/15/2021).   Pertinent  Medical History  Asthma DM2 Gout HTN Obesity   GERD AKI Neuropathy  Significant Hospital Events:  6/30 admitted for PNA with known HIV/AIDS 7/1 ET >>> Echo 7/1 mild  RA dilation with RV low nl fxn mild PAS elevation and nl LV 7/1 - renal consult, ID consult 7/2 HD catheter >>> 7/5, Fi O2 50%, Pneumocystis DFA negative 03/06/21 ([facility]), MSSA on LRCx LUE ultrasound 7/5 > No DVT, acute superficial vein thrombosis L cephalic vein.  7/6 - ETT tube advancec by CRNA 7/7 - CRRT stopped 7/8 - Expanding antibiotics to include CNS coverage including acyclovir. CT non-acute. EEG pending. LP pending once off subcutaneous heparin x 24 hours. Will hold additional doses. Have discussed with RN. Last dose today at 1400.       Interim History / Subjective:    7/9 = remains on ventilator.  FiO2 50%.  On infusion of propofol and fentanyl.  Blood pressure soft but has low diast.  Fluid bolus being given.  Tube feeds held because of pending lumbar puncture.  Has become hypoglycemic and D5LR and dextrose bolus started.  Febrile to 101 Fahrenheit.  White count 17.5 thousand and slightly better.  Making urine but +10.8 L since admit but Ur OP. Down.  Worsening kidney injury creatinine 3.6 with potassium 5.3.   CRRT  was stopped 7/7 . No overt bleeding hemoglobin down 1.8 g% to 6.7 g%.    Awaiting LP - postponed due to SQ heparin Ceribell spot EEG ongoing - generalized slowing per Dr Hortense Ramal  Objective   Blood pressure (!) 98/32, pulse (!) 118, temperature (!) 100.76 F (38.2 C), temperature source Bladder, resp. rate 18, height '5\' 11"'$  (1.803 m), weight 96.7 kg, SpO2 100 %.  Vent Mode: PRVC FiO2 (%):  [50 %-60 %] 50 % Set Rate:  [20 bmp] 20 bmp Vt Set:  [550 mL] 550 mL PEEP:  [8 cmH20] 8 cmH20 Plateau Pressure:  [22 cmH20-30 cmH20] 26 cmH20   Intake/Output Summary (Last 24 hours) at 03/13/2021 0905 Last data filed at 03/13/2021 0700 Gross per 24 hour  Intake 5188.45 ml  Output 1180 ml  Net 4008.45 ml   Filed Weights   03/11/21 0500 03/12/21  0500 03/13/21 0600  Weight: 94.3 kg 92.9 kg 96.7 kg       Examination:  General Appearance:  Looks criticall ill OBESE - yes Head:  Normocephalic, without obvious abnormality, atraumatic. HAS EEG + Eyes:  PERRL - yes, conjunctiva/corneas - muddy     Ears:  Normal external ear canals, both ears Nose:  G tube - no Throat:  ETT TUBE - yes , OG tube - yes Neck:  Supple,  No enlargement/tenderness/nodules Lungs: Clear to auscultation bilaterally, Ventilator   Synchrony - mild dysn 50% fi2, peep 8 Heart:  S1 and S2 normal, no murmur, CVP - no.  Pressors - n. SBP 97 MAP 57, Diast 38 Abdomen:  Soft, no masses, no organomegaly Genitalia / Rectal:  Not done Extremities:  Extremities- intac Skin:  ntact in exposed areas . Sacral area - not examined Neurologic:  Sedation - fen gtt , diprivna gtt -> RASS - -3 . Moves all 4s - yes ? tremors. CAM-ICU - not able to test . Orientation - not oridented      LABS    PULMONARY Recent Labs  Lab 03/07/21 1000 03/08/21 1020 03/10/21 2230 03/11/21 1039  PHART 7.287* 7.336* 7.372 7.373  PCO2ART 41.1 45.7 46.4 42.8  PO2ART 118* 83.5 76.1* 66.7*  HCO3 19.0* 23.7 26.3 24.3  O2SAT 97.0 93.2 93.4 90.7    CBC Recent Labs  Lab 03/11/21 0520 03/12/21 0501 03/13/21 0730  HGB 7.4* 8.5* 6.7*  HCT 25.7* 29.4* 23.4*  WBC 15.1* 19.1* 17.5*  PLT 202 227 197    COAGULATION No results for input(s): INR in the last 168 hours.  CARDIAC  No results for input(s): TROPONINI in the last 168 hours. No results for input(s): PROBNP in the last 168 hours.   CHEMISTRY Recent Labs  Lab 03/09/21 1637 03/10/21 0408 03/10/21 2239 03/11/21 0520 03/12/21 0501 03/13/21 0405  NA 137 137 140 140 139 137  K 4.1 4.3 3.9 3.8 4.6 5.3*  CL 105 103 104 106 105 102  CO2 '25 26 28 28 24 23  '$ GLUCOSE 137* 227* 158* 161* 154* 103*  BUN 43* 42* 59* 62* 85* 98*  CREATININE 1.69* 1.58* 1.73* 1.90* 2.56* 3.62*  3.51*  CALCIUM 8.1* 8.2* 8.4* 8.4* 8.3* 8.4*  MG  --   2.4 2.1 2.2 2.0 2.1  PHOS 3.5 2.9  --  4.8*  4.7* 5.8* 7.8*   Estimated Creatinine Clearance: 30.9 mL/min (A) (by C-G formula based on SCr of 3.62 mg/dL (H)).   LIVER Recent Labs  Lab 03/10/21 0408 03/10/21 2239 03/11/21 0520 03/12/21 0501 03/13/21 0405  AST  --  23 23  --   --   ALT  --  17 17  --   --   ALKPHOS  --  65 69  --   --   BILITOT  --  0.5 0.4  --   --   PROT  --  6.7 6.3*  --   --   ALBUMIN 2.3* 2.0* 1.9*  1.9* 2.1* 1.8*  INFECTIOUS No results for input(s): LATICACIDVEN, PROCALCITON in the last 168 hours.   ENDOCRINE CBG (last 3)  Recent Labs    03/13/21 0356 03/13/21 0752 03/13/21 0831  GLUCAP 122* 73 120*         IMAGING x48h  - image(s) personally visualized  -   highlighted in bold DG Abd 1 View  Result Date: 03/12/2021 CLINICAL DATA:  OG tube placement EXAM: ABDOMEN - 1 VIEW COMPARISON:  03/10/2021 FINDINGS: Enteric tube with the tip projecting over the antrum of the stomach. No bowel dilatation to suggest obstruction. No evidence of pneumoperitoneum, portal venous gas or pneumatosis. No pathologic calcifications along the expected course of the ureters. No acute osseous abnormality. IMPRESSION: Enteric tube with the tip projecting over the antrum of the stomach. Electronically Signed   By: Kathreen Devoid   On: 03/12/2021 10:17   CT HEAD WO CONTRAST  Result Date: 03/12/2021 CLINICAL DATA:  Delirium. EXAM: CT HEAD WITHOUT CONTRAST TECHNIQUE: Contiguous axial images were obtained from the base of the skull through the vertex without intravenous contrast. COMPARISON:  None. FINDINGS: Brain: No evidence of acute infarction, hemorrhage, hydrocephalus, extra-axial collection or mass lesion/mass effect. Mild atrophy with ex vacuo ventricular dilation. Vascular: No hyperdense vessel identified. Mild calcific intracranial atherosclerosis. Skull: No acute fracture. Sinuses/Orbits: Visualized sinuses are clear. Other: Prominent soft tissue/fluid in the  posterior nasopharynx, partially imaged. IMPRESSION: 1. No evidence of acute intracranial abnormality. Mild atrophy. 2. Prominent soft tissue/fluid in the posterior nasopharynx, possibly adenoid hypertrophy and layering secretions; however, this area is incompletely imaged. Recommend correlation with direct inspection. 3. Small left mastoid effusion. Electronically Signed   By: Margaretha Sheffield MD   On: 03/12/2021 15:45   DG Chest Port 1 View  Result Date: 03/11/2021 CLINICAL DATA:  HIV/aids, acute hypoxic respiratory failure, shortness of breath, productive cough with tan sputum EXAM: PORTABLE CHEST 1 VIEW COMPARISON:  Portable exam 1130 hours compared to 03/10/2021 FINDINGS: Tip of endotracheal tube projects 3.0 cm above carina. Nasogastric tube extends into stomach. RIGHT jugular central venous catheter with tip projecting over SVC. Normal heart size mediastinal contours. BILATERAL pulmonary infiltrates, greatest in RIGHT upper lobe, consistent with multifocal pneumonia. No pleural effusion or pneumothorax. IMPRESSION: BILATERAL pulmonary infiltrates consistent with multifocal pneumonia, greatest in RIGHT upper lobe. Little interval change since prior exam. Electronically Signed   By: Lavonia Dana M.D.   On: 03/11/2021 11:59        Resolved Hospital Problem list   Severe sepsis/Septic shock due to pneumonia Transaminitis  Assessment & Plan:   Acute hypoxemic respiratory failure due to pneumonia: AIDS, broad differential. DFA negative, MSSA on Sputum Cx. - Present on admission. Intubated < 24h of asmit  03/13/2021 - > does not meet criteria for SBT/Extubation in setting of Acute Respiratory Failure due to fio2 50% , encephalopathy and AIDS  Plan - PRVC - VAP bundle  Pre admit  Chronic neuropathic pain - on gabapentin and oioiods Pre admit Chronic sbustance abuse - marijuama Pre admit Chronic opioid dpendence  - vicodin, tramadol Pre admit chronic anxiety  - ambien at home   Acute  metabolic encepalopathy - present on admission 03/04/2021   03/13/2021- ceribell EEG - only slowing. LP pending. Having agitation with diprivan and fent gtt  Plan - await LP by IR  - - Fentanyl nfusions for RASS goal 0 to -2.  - dc diprivan infusion - start versed infusion  - consider precedex infusion if needed - Clonazepam 1 mg BID and  oxycodone '10mg'$  BID - Continue home gabapentin at reduced dose - start haldol PRN with QTc monitoring - Would be a reasonable candidate for early trach if his delirium does not improve with chemical modalities.   Hx of HTN: Home medications include Norvasc, HCTZ, Lisinopril,  Hypotension -  - repeat 7//922  Plan  - hold lasix  - fluid bolus  - neo if needed - MAP > 60 and sBP > 100 as goal   MSSA pneumonia, immunocompromised in the setting of HIV/AIDS: - from culture 03/06/21 but based on  CXR = present on admit MSSA on culture. DFA negative. Not had bronch  Plan - Appreciate ID assistance  - - LP pending 3pm 03/13/2021  - Can bronch if needed by ID  - Drugs per ID service  Advanced HIV/AIDS - present as baseline on admit: Low CD 4, HIV viral load >1.47 mil - Rx per ID. Lamivudine and tivicay.    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 - CRRT stopped 7/6  03/13/2021- worsening creat and potassium. And threatening hypotension  Plan  - dc lasix  - restart CRRT (RN has confirmed with renal this wil happen)   - Holding home medications fo rnow   Type 2 diabetes with diabetic neuropathic - preadmit baseline problem Neuropathic pain: in feet - chronic pre amdit --SSI  --CBG checks q4  Anemia: Due to chronic disease, inflammation, AIDS.    03/13/2021- hgb 6.7. No overt bleeding  Plan  - - PRBC for hgb </= 6.9gm%    - exceptions are   -  if ACS susepcted/confirmed then transfuse for hgb </= 8.0gm%,  or    -   active bleeding with hemodynamic instability, then transfuse regardless of hemoglobin value   At at all times try to transfuse 1 unit prbc as possible with exception of active hemorrhage   Best Practice    Diet/type: tf per nutrition  DVT prophylaxis: prophylactic heparin  - on hold till LPT GI prophylaxis: PPI Lines:  HD catheter Foley:  N/A Code Status:  full code Last date of multidisciplinary goals of care discussion   7/8 - GF at bedside 7/9 called mom [phone] - informed her of Lumbar Puncture at 3pm and anemia   ATTESTATION & SIGNATURE   The patient Bruce Hunter is critically ill with multiple organ systems failure and requires high complexity decision making for assessment and support, frequent evaluation and titration of therapies, application of advanced monitoring technologies and extensive interpretation of multiple databases.   Critical Care Time devoted to patient care services described in this note is  60  Minutes. This time reflects time of care of this signee Dr Brand Males. This critical care time does not reflect procedure time, or teaching time or supervisory time of PA/NP/Med student/Med Resident etc but could involve care discussion time     Dr. Brand Males, M.D., [facility].C.P Pulmonary and Critical Care Medicine Staff Physician [facility] Pager: [phone], If no answer or between  15:00h - 7:00h: call [phone]  03/13/2021 9:05 AM

## 2021-03-13 NOTE — Procedures (Signed)
Fluroscopic guided Lumbar puncture performed at L3-4.  18 gauge spinal needle used.  OP 38 cm H20.  CP 32 cm H20.  14 cc clear, colorless CSF removed and sent to lab.  The patient tolerated the procedure well with no immediate complications.

## 2021-03-14 ENCOUNTER — Inpatient Hospital Stay (HOSPITAL_COMMUNITY): Payer: Medicaid Other

## 2021-03-14 DIAGNOSIS — J9601 Acute respiratory failure with hypoxia: Secondary | ICD-10-CM | POA: Diagnosis not present

## 2021-03-14 LAB — POCT ACTIVATED CLOTTING TIME
Activated Clotting Time: 144 seconds
Activated Clotting Time: 144 seconds
Activated Clotting Time: 144 seconds
Activated Clotting Time: 155 seconds
Activated Clotting Time: 167 seconds
Activated Clotting Time: 184 seconds
Activated Clotting Time: 184 seconds
Activated Clotting Time: 190 seconds
Activated Clotting Time: 190 seconds
Activated Clotting Time: 167 seconds
Activated Clotting Time: 161 seconds
Activated Clotting Time: 179 seconds
Activated Clotting Time: 190 seconds
Activated Clotting Time: 179 seconds
Activated Clotting Time: 184 seconds
Activated Clotting Time: 173 seconds
Activated Clotting Time: 173 seconds
Activated Clotting Time: 190 seconds
Activated Clotting Time: 167 seconds
Activated Clotting Time: 173 seconds
Activated Clotting Time: 173 seconds
Activated Clotting Time: 190 seconds
Activated Clotting Time: 196 seconds
Activated Clotting Time: 196 seconds

## 2021-03-14 LAB — COMPREHENSIVE METABOLIC PANEL
ALT: 5 U/L (ref 0–44)
AST: 24 U/L (ref 15–41)
Albumin: 1.8 g/dL — ABNORMAL LOW (ref 3.5–5.0)
Alkaline Phosphatase: 91 U/L (ref 38–126)
Anion gap: 8 (ref 5–15)
BUN: 71 mg/dL — ABNORMAL HIGH (ref 6–20)
CO2: 25 mmol/L (ref 22–32)
Calcium: 8.1 mg/dL — ABNORMAL LOW (ref 8.9–10.3)
Chloride: 102 mmol/L (ref 98–111)
Creatinine, Ser: 2.76 mg/dL — ABNORMAL HIGH (ref 0.61–1.24)
GFR, Estimated: 28 mL/min — ABNORMAL LOW (ref >60)
Glucose, Bld: 183 mg/dL — ABNORMAL HIGH (ref 70–99)
Potassium: 4.3 mmol/L (ref 3.5–5.1)
Sodium: 135 mmol/L (ref 135–145)
Total Bilirubin: 0.6 mg/dL (ref 0.3–1.2)
Total Protein: 6.5 g/dL (ref 6.5–8.1)

## 2021-03-14 LAB — RENAL FUNCTION PANEL
Albumin: 1.8 g/dL — ABNORMAL LOW (ref 3.5–5.0)
Albumin: 1.8 g/dL — ABNORMAL LOW (ref 3.5–5.0)
Albumin: 1.8 g/dL — ABNORMAL LOW (ref 3.5–5.0)
Albumin: 1.9 g/dL — ABNORMAL LOW (ref 3.5–5.0)
Anion gap: 12 (ref 5–15)
Anion gap: 12 (ref 5–15)
Anion gap: 7 (ref 5–15)
Anion gap: 8 (ref 5–15)
BUN: 53 mg/dL — ABNORMAL HIGH (ref 6–20)
BUN: 70 mg/dL — ABNORMAL HIGH (ref 6–20)
BUN: 79 mg/dL — ABNORMAL HIGH (ref 6–20)
BUN: 88 mg/dL — ABNORMAL HIGH (ref 6–20)
CO2: 22 mmol/L (ref 22–32)
CO2: 22 mmol/L (ref 22–32)
CO2: 25 mmol/L (ref 22–32)
CO2: 26 mmol/L (ref 22–32)
Calcium: 7.8 mg/dL — ABNORMAL LOW (ref 8.9–10.3)
Calcium: 8 mg/dL — ABNORMAL LOW (ref 8.9–10.3)
Calcium: 8.4 mg/dL — ABNORMAL LOW (ref 8.9–10.3)
Calcium: 8.4 mg/dL — ABNORMAL LOW (ref 8.9–10.3)
Chloride: 101 mmol/L (ref 98–111)
Chloride: 102 mmol/L (ref 98–111)
Chloride: 102 mmol/L (ref 98–111)
Chloride: 103 mmol/L (ref 98–111)
Creatinine, Ser: 2.23 mg/dL — ABNORMAL HIGH (ref 0.61–1.24)
Creatinine, Ser: 2.8 mg/dL — ABNORMAL HIGH (ref 0.61–1.24)
Creatinine, Ser: 3.15 mg/dL — ABNORMAL HIGH (ref 0.61–1.24)
Creatinine, Ser: 3.79 mg/dL — ABNORMAL HIGH (ref 0.61–1.24)
GFR, Estimated: 19 mL/min — ABNORMAL LOW (ref >60)
GFR, Estimated: 24 mL/min — ABNORMAL LOW (ref >60)
GFR, Estimated: 28 mL/min — ABNORMAL LOW (ref >60)
GFR, Estimated: 36 mL/min — ABNORMAL LOW (ref >60)
Glucose, Bld: 116 mg/dL — ABNORMAL HIGH (ref 70–99)
Glucose, Bld: 119 mg/dL — ABNORMAL HIGH (ref 70–99)
Glucose, Bld: 182 mg/dL — ABNORMAL HIGH (ref 70–99)
Glucose, Bld: 183 mg/dL — ABNORMAL HIGH (ref 70–99)
Phosphorus: 5.2 mg/dL — ABNORMAL HIGH (ref 2.5–4.6)
Phosphorus: 6.8 mg/dL — ABNORMAL HIGH (ref 2.5–4.6)
Phosphorus: 8 mg/dL — ABNORMAL HIGH (ref 2.5–4.6)
Phosphorus: 8.5 mg/dL — ABNORMAL HIGH (ref 2.5–4.6)
Potassium: 4.3 mmol/L (ref 3.5–5.1)
Potassium: 4.5 mmol/L (ref 3.5–5.1)
Potassium: 4.6 mmol/L (ref 3.5–5.1)
Potassium: 5 mmol/L (ref 3.5–5.1)
Sodium: 135 mmol/L (ref 135–145)
Sodium: 135 mmol/L (ref 135–145)
Sodium: 136 mmol/L (ref 135–145)
Sodium: 136 mmol/L (ref 135–145)

## 2021-03-14 LAB — GLUCOSE, CAPILLARY
Glucose-Capillary: 128 mg/dL — ABNORMAL HIGH (ref 70–99)
Glucose-Capillary: 167 mg/dL — ABNORMAL HIGH (ref 70–99)
Glucose-Capillary: 166 mg/dL — ABNORMAL HIGH (ref 70–99)
Glucose-Capillary: 174 mg/dL — ABNORMAL HIGH (ref 70–99)
Glucose-Capillary: 153 mg/dL — ABNORMAL HIGH (ref 70–99)

## 2021-03-14 LAB — BASIC METABOLIC PANEL
Anion gap: 10 (ref 5–15)
BUN: 89 mg/dL — ABNORMAL HIGH (ref 6–20)
CO2: 22 mmol/L (ref 22–32)
Calcium: 8.3 mg/dL — ABNORMAL LOW (ref 8.9–10.3)
Chloride: 103 mmol/L (ref 98–111)
Creatinine, Ser: 3.69 mg/dL — ABNORMAL HIGH (ref 0.61–1.24)
GFR, Estimated: 20 mL/min — ABNORMAL LOW (ref >60)
Glucose, Bld: 119 mg/dL — ABNORMAL HIGH (ref 70–99)
Potassium: 5.1 mmol/L (ref 3.5–5.1)
Sodium: 135 mmol/L (ref 135–145)

## 2021-03-14 LAB — CBC
HCT: 25.6 % — ABNORMAL LOW (ref 39.0–52.0)
Hemoglobin: 7.5 g/dL — ABNORMAL LOW (ref 13.0–17.0)
MCH: 28.7 pg (ref 26.0–34.0)
MCHC: 29.3 g/dL — ABNORMAL LOW (ref 30.0–36.0)
MCV: 98.1 fL (ref 80.0–100.0)
Platelets: 180 10*3/uL (ref 150–400)
RBC: 2.61 MIL/uL — ABNORMAL LOW (ref 4.22–5.81)
RDW: 21 % — ABNORMAL HIGH (ref 11.5–15.5)
WBC: 13.1 10*3/uL — ABNORMAL HIGH (ref 4.0–10.5)
nRBC: 0.5 % — ABNORMAL HIGH (ref 0.0–0.2)

## 2021-03-14 LAB — PROCALCITONIN: Procalcitonin: 4.8 ng/mL

## 2021-03-14 LAB — APTT: aPTT: 57 seconds — ABNORMAL HIGH (ref 24–36)

## 2021-03-14 LAB — MAGNESIUM: Magnesium: 2.1 mg/dL (ref 1.7–2.4)

## 2021-03-14 IMAGING — DX DG CHEST 1V PORT
1 series · 2 of 2 positions shown · non-contrast
Comparison: Chest x-rays dated [DATE] and [DATE].

CLINICAL DATA: Acute hypoxic respiratory failure with sepsis.
History of HIV/aids, cystic kidney disease, asthma, diabetes.

EXAM:
PORTABLE CHEST 1 VIEW

[Series 1: chest ap · 0.14mm/px · 2 of 2 slices shown]
[im 1/2]
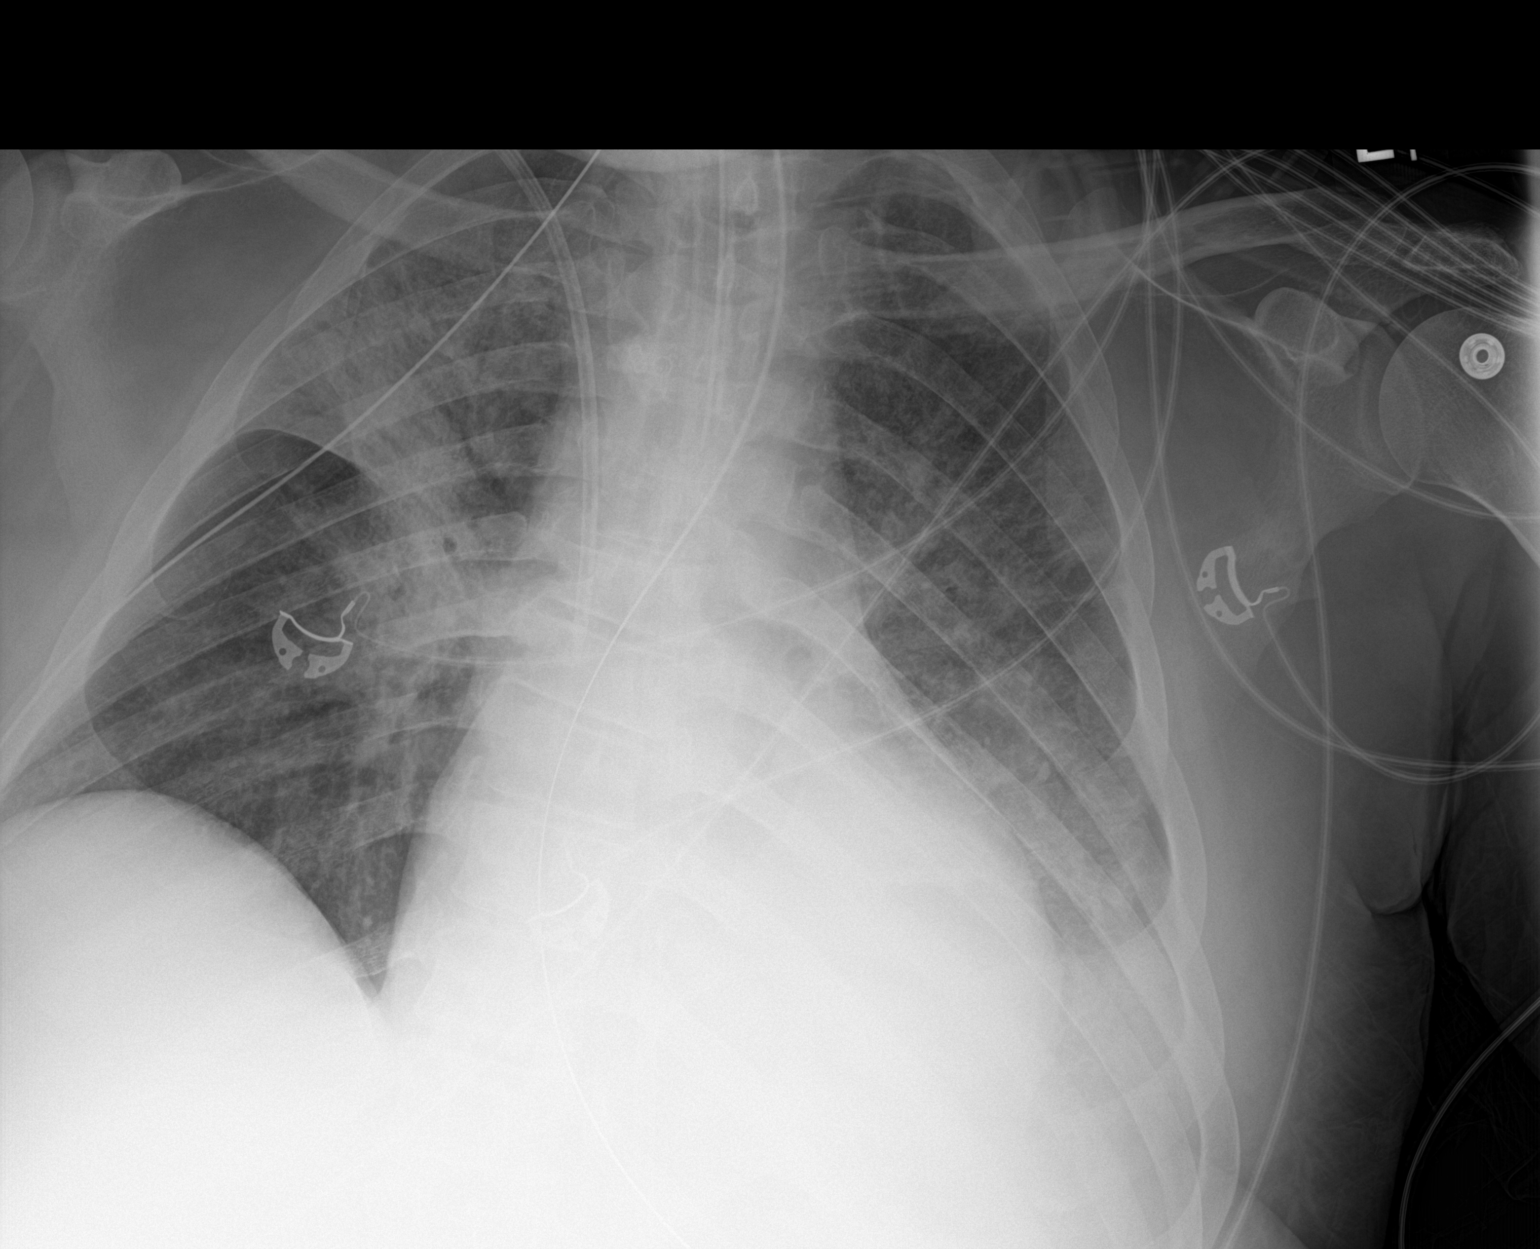
[im 2/2]
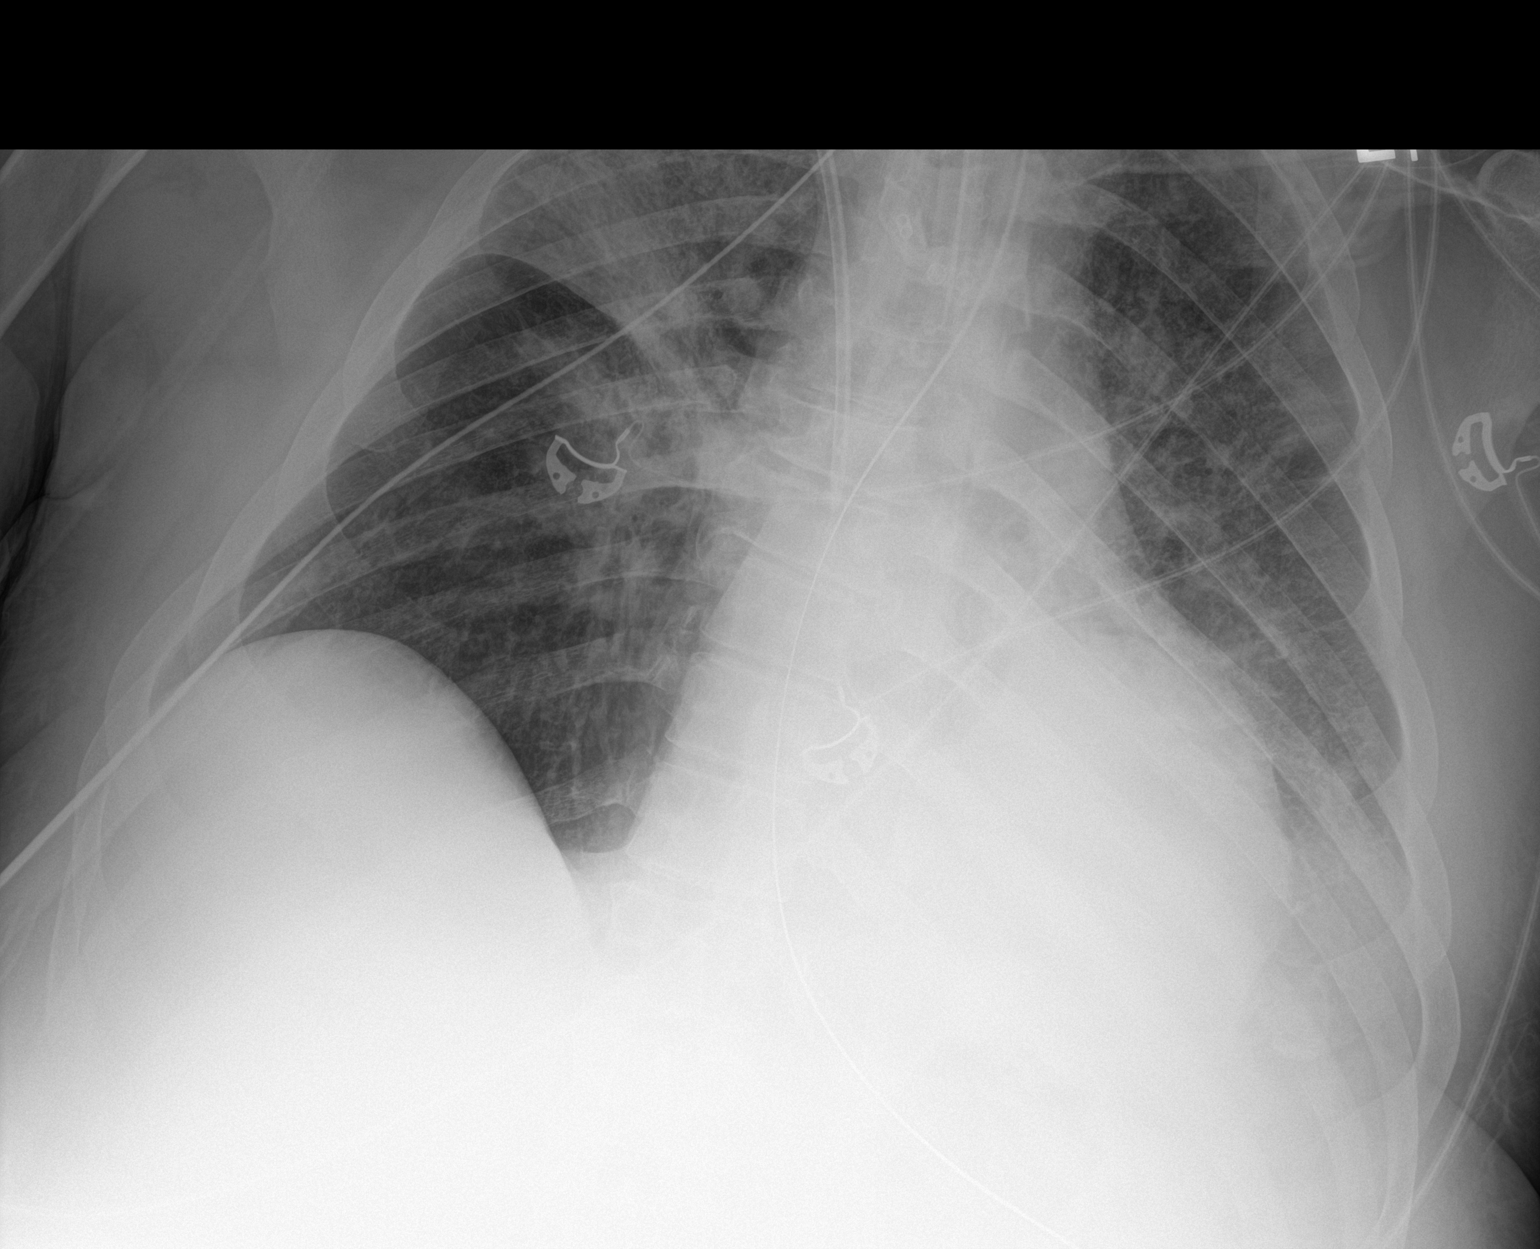

[2 of 2 positions shown; findings below may reference images not displayed]

FINDINGS: Tubes and lines appear stable in position. Bilateral airspace
opacities are not significantly changed, again most dense/confluent
in the RIGHT upper lobe. Suspect small LEFT pleural effusion. No
pneumothorax is seen. Heart size and mediastinal contours appear
stable.
IMPRESSION: No significant interval change. Bilateral airspace opacities, most
dense/confluent in the RIGHT upper lobe, compatible with multifocal
pneumonia. Probable small LEFT pleural effusion.

## 2021-03-14 MED ORDER — PRISMASOL BGK 4/2.5 32-4-2.5 MEQ/L REPLACEMENT SOLN: Status: DC

## 2021-03-14 MED ORDER — PRISMASOL BGK 4/2.5 32-4-2.5 MEQ/L EC SOLN: Status: DC

## 2021-03-14 MED ORDER — DARBEPOETIN ALFA 40 MCG/0.4ML IJ SOSY
40.0000 ug | PREFILLED_SYRINGE | INTRAMUSCULAR | Status: DC
  Administered 2021-03-14 – 2021-03-21 (×2): 40 ug via SUBCUTANEOUS
  Filled 2021-03-14 (×2): qty 0.4

## 2021-03-14 NOTE — Progress Notes (Signed)
NAME:  Bruce Hunter, MRN:  [id number], DOB:  07/28/1976, LOS: 10 ADMISSION DATE:  03/04/2021, CONSULTATION DATE:  03/04/21 REFERRING MD:  Melina Copa, CHIEF COMPLAINT:  Shortness of breath.     Brief Narrative:  45 yo man with HIV/AIDS here with acute hypoxemic respiratory failure.  Complaints of SOB and cough productive of tan sputum.  Has had respiratory symptoms since he was discharged earlier this month (6/13).     Recently diagnosed with HIV/AIDS, started on medications, but patient tells me he "hasn't had time to start taking them" but he isn't really able to explain further.  Patient was initiated on lamivudine, Tivicay, dapsone and fluconazole on discharge.  Patient does have an appointment with infectious disease as outpatient.  Candida esophagitis. At the time.    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 Mid June 2022 CD4 count is <35 and initial viral load of 2.7 million entering care at Stage 3. Genotype remains pending. Treated for candida esophagitis on entry to care. [id number] negative.  Initially started on Dovato and Dapsone. DTG plus lamivudine. He can get the combined pill, dovato, upon discharge. Can send rx for dovato for free 30 day supply Esophageal candidiasis = recommend to treat with fluconazole '400mg'$  po daily x 14-21 days Opportunistic infection proph = continue on dapsone '100mg'$  daily   Also baseline  - polyphamracy and neuropathic pain from HIV. DM. Also Marijuana usr   has a past medical history of Asthma, Diabetes mellitus without complication ([place]), Gout, Hyperlipidemia, Hypertension, and Obesity.   has a past surgical history that includes No past surgeries; Ganglion cyst excision (Right, 02/26/2020); Esophagogastroduodenoscopy (egd) with propofol (N/A, 02/15/2021); Colonoscopy with propofol (N/A, 02/15/2021); and biopsy (02/15/2021).   Pertinent  Medical History  Asthma DM2 Gout HTN Obesity   GERD AKI Neuropathy  Significant Hospital Events:  6/30 admitted for PNA with known HIV/AIDS 7/1 ET >>> Echo 7/1 mild  RA dilation with RV low nl fxn mild PAS elevation and nl LV 7/1 - renal consult, ID consult 7/2 HD catheter >>> 7/5, Fi O2 50%, Pneumocystis DFA negative 03/06/21 ([facility]), MSSA on LRCx LUE ultrasound 7/5 > No DVT, acute superficial vein thrombosis L cephalic vein.  7/6 - ETT tube advancec by CRNA 7/7 - CRRT stopped 7/8 - Expanding antibiotics to include CNS coverage including acyclovir. CT non-acute. EEG pending. LP pending once off subcutaneous heparin x 24 hours. Will hold additional doses. Have discussed with RN. Last dose today at 1400.   7/9 = remains on ventilator.  FiO2 50%.  On infusion of propofol and fentanyl.  Blood pressure soft but has low diast.  Fluid bolus being given.  Tube feeds held because of pending lumbar puncture.  Has become hypoglycemic and D5LR and dextrose bolus started.  Febrile to 101 Fahrenheit.  White count 17.5 thousand and slightly better.  Making urine but +10.8 L since admit but Ur OP. Down.  Worsening kidney injury creatinine 3.6 with potassium 5.3.   CRRT  was stopped 7/7 .  No overt bleeding hemoglobin down 1.8 g% to 6.7 g%.   -> s.p  1 unit PRBC  CTABD - no RP bleed. LP later in day -norm CRRT resumed LP -opening pressure 38 cmH2O, cervical pressure 32 cm, clear colorless CSF, 91 mg percent glucose 0 RBC, 1 WBC, protein 30 mg/dL Ceribell spot EEG - generalized slowing per Dr Hortense Ramal    Interim History / Subjective:    03/14/21:  Remains on the ventilator o 50% FiO2.  Back On  CRRT. -> filter clogged. Running now on heparin. No overt bleed.  Sedated on fentanyl infusion, Versed infusion and scheduled oxycodone we will question from renal couple people with large saddle embolus extensive DVT on Levophed infusion.  Getting tube feeds.  Afebrile now [T-max 101].  White count improved to 13k  Objective   Blood pressure (!) 123/54,  pulse 93, temperature (!) 95.54 F (35.3 C), resp. rate 20, height '5\' 11"'$  (1.803 m), weight 97.5 kg, SpO2 97 %.    Vent Mode: PRVC FiO2 (%):  [50 %-60 %] 50 % Set Rate:  [20 bmp] 20 bmp Vt Set:  [550 mL] 550 mL PEEP:  [8 cmH20] 8 cmH20 Plateau Pressure:  [22 cmH20-24 cmH20] 23 cmH20   Intake/Output Summary (Last 24 hours) at 03/14/2021 0914 Last data filed at 03/14/2021 0900 Gross per 24 hour  Intake 4825.31 ml  Output 3362.4 ml  Net 1462.91 ml   Filed Weights   03/12/21 0500 03/13/21 0600 03/14/21 0500  Weight: 92.9 kg 96.7 kg 97.5 kg       General Appearance:  Looks criticall ill Head:  Normocephalic, without obvious abnormality, atraumatic Eyes:  PERRL - yes, conjunctiva/corneas - mudduy   Ears:  Normal external ear canals, both ears Nose:  G tube -no Throat:  ETT TUBE - yes , OG tube - yes on TF Neck:  Supple,  No enlargement/tenderness/nodules Lungs: Clear to auscultation bilaterally, Ventilator   Synchrony - yes 50% fio2, peep 8 Heart:  S1 and S2 normal, no murmur, CVP  x  Pressors - levophed + Abdomen:  Soft, no masses, no organomegaly Genitalia / Rectal:  Not done Extremities:  Extremities- intact Skin:  ntact in exposed areas . Sacral area - not examined Neurologic:  Sedation - fent gtt, versed gtt -> RASS - -4 . Moves all 4s - not able to eval. CAM-ICU - sedated . Orientation - no       Resolved Hospital Problem list   Severe sepsis/Septic shock due to pneumonia Transaminitis  Assessment & Plan:   Acute hypoxemic respiratory failure due to pneumonia: AIDS, broad differential. DFA negative, MSSA on Sputum Cx. - Present on admission. Intubated < 24h of asmit  03/14/2021 - > does not meet criteria for SBT/Extubation in setting of Acute Respiratory Failure due to Resp failure, septic shock   Plan - PRVC - VAP bundle    Pre admit  Chronic neuropathic pain - on gabapentin and oioiods Pre admit Chronic sbustance abuse - marijuama Pre admit Chronic opioid  dpendence  - vicodin, tramadol Pre admit chronic anxiety  - ambien at home Acute metabolic encepalopathy -  due to sepsis syndrome, resp failure, multipfactoria  - present on admission 03/04/2021 - ceribell EEG 7/9 - only slowing - LP 7/9 -- currently normal   03/14/2021- RASS -3 on sedation gtt. Gets agitated on [place] - - Fentanyl nfusions for RASS goal 0 to -3 - versed infusion gtt  - consider precedex infusion if needed - Clonazepam 1 mg BID and oxycodone '10mg'$  BID - Continue home gabapentin at reduced dose -  haldol PRN with QTc monitoring - Would be a reasonable candidate for early trach if his delirium does not improve with chemical modalities.   Hx of HTN: Home medications include Norvasc, HCTZ, Lisinopril,  Circulatory shock  -  7//922   03/14/2021- persistent circulatory shock. Likely sepsis (MSSA pna) and meds medicated  Plan  - levophed  if needed - MAP > 60 and sBP > 100 as goal  Baseline pre-admit:  immunocompromised due  HIV/AIDS  MSSA pneumonia,: - from culture 03/06/21 but based on  CXR = present on admit MSSA on culture. DFA negative. Not had bronch  03/14/2021- ? Turning afebrile. LP looks normal 03/13/21   Plan - Appreciate ID assistance  - - LP results culture awaited - Can bronch if needed by ID  - Drugs per ID service  Advanced HIV/AIDS - present as baseline on admit: Low CD 4, HIV viral load >1.47 mil - Rx per ID. Lamivudine and tivicay.    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 - CRRT stopped 7/6 and restarted 03/13/21  03/14/2021-  on CRRT. Filter changed xx 1 -> CRRT with heparin Plan  - CRRT per renal   Anemia: Due to chronic disease, inflammation, AIDS and chritical illness  - s/p  1 u prbc and no RP bleed on CT 03/13/21. Concer for RLQ - subcut hematoma but not apparent on exam  03/14/2021- hgb 7.5 . On heparin for  CRRT. No overt bleeding   Plan  - - PRBC for hgb </= 6.9gm%    - exceptions are   -  if ACS susepcted/confirmed then transfuse for hgb </= 8.0gm%,  or    -  active bleeding with hemodynamic instability, then transfuse regardless of hemoglobin value   At at all times try to transfuse 1 unit prbc as possible with exception of active hemorrhage   Best Practice    Diet/type: tf per nutrition  DVT prophylaxis: prophylactic heparin  - restarted post LP and has Hpearin for CRRT GI prophylaxis: PPI Lines:  HD catheter Foley:  N/A Code Status:  full code Last date of multidisciplinary goals of care discussion   7/8 - GF at bedside 7/9 called mom [phone] - informed her of Lumbar Puncture at 3pm and anemia 7/10 - updated mom    ATTESTATION & SIGNATURE   The patient ARCHIVALDO SURETTE is critically ill with multiple organ systems failure and requires high complexity decision making for assessment and support, frequent evaluation and titration of therapies, application of advanced monitoring technologies and extensive interpretation of multiple databases.   Critical Care Time devoted to patient care services described in this note is  45  Minutes. This time reflects time of care of this signee Dr Brand Males. This critical care time does not reflect procedure time, or teaching time or supervisory time of PA/NP/Med student/Med Resident etc but could involve care discussion time     Dr. Brand Males, M.D., [facility].C.P Pulmonary and Critical Care Medicine Staff Physician [facility] Pager: [phone], If no answer or between  15:00h - 7:00h: call [phone]  03/14/2021 9:14 AM   LABS    PULMONARY Recent Labs  Lab 03/07/21 1000 03/08/21 1020 03/10/21 2230 03/11/21 1039 03/13/21 1203  PHART 7.287* 7.336* 7.372 7.373 7.274*  PCO2ART 41.1 45.7 46.4 42.8 46.1  PO2ART 118* 83.5 76.1* 66.7* 96.8  HCO3 19.0* 23.7 26.3 24.3 20.7   O2SAT 97.0 93.2 93.4 90.7 96.3    CBC Recent Labs  Lab 03/13/21 0943 03/13/21 2048 03/14/21 0514  HGB 6.5* 7.7* 7.5*  HCT 23.1* 26.9* 25.6*  WBC 20.2* 13.1* 13.1*  PLT 218  202 204  [place]  Lab 03/13/21 0943  INR 1.1    CARDIAC  No results for input(s): TROPONINI in the last 168 hours. No results for input(s): PROBNP in the last 168 hours.   CHEMISTRY Recent Labs  Lab 03/10/21 2239 03/11/21 0520 03/12/21 0501 03/13/21 0405 03/13/21 1519 03/13/21 1730 03/13/21 2359 03/14/21 0030 03/14/21 0055 03/14/21 0514  NA 140 140 139 137 135  135 136  134* 135 135 136 135  135  K 3.9 3.8 4.6 5.3* 6.0*  6.1* 6.1*  6.1* 5.0 5.1 4.6 4.3  4.3  CL 104 106 105 102 101  102 101  102 101 103 102 102  102  CO2 '28 28 24 23 '$ 21*  21* 19*  21* '22 22 22 25  25  '$ GLUCOSE 158* 161* 154* 103* 145*  144* 146*  147* 119* 119* 116* 182*  183*  BUN 59* 62* 85* 98* 98*  98* 98*  100* 88* 89* 79* 70*  71*  CREATININE 1.73* 1.90* 2.56* 3.62*  3.51* 3.92*  4.01* 4.20*  4.24* 3.79* 3.69* 3.15* 2.80*  2.76*  CALCIUM 8.4* 8.4* 8.3* 8.4* 8.5*  8.6* 8.7*  8.6* 8.4* 8.3* 8.4* 8.0*  8.1*  MG 2.1 2.2 2.0 2.1  --   --   --   --   --  2.1  PHOS  --  4.8*  4.7* 5.8* 7.8* 9.1* 9.7* 8.5*  --  8.0* 6.8*   Estimated Creatinine Clearance: 40.7 mL/min (A) (by C-G formula based on SCr of 2.76 mg/dL (H)).   LIVER Recent Labs  Lab 03/10/21 2239 03/11/21 0520 03/12/21 0501 03/13/21 0943 03/13/21 1519 03/13/21 1730 03/13/21 2359 03/14/21 0055 03/14/21 0514  AST 23 23  --   --  26  --   --   --  24  ALT 17 17  --   --  6  --   --   --  5  ALKPHOS 65 69  --   --  91  --   --   --  91  BILITOT 0.5 0.4  --   --  0.6  --   --   --  0.6  PROT 6.7 6.3*  --   --  6.7  --   --   --  6.5  ALBUMIN 2.0* 1.9*  1.9*   < >  --  1.8*  1.8* 1.9* 1.8* 1.9* 1.8*  1.8*  INR  --   --   --  1.1  --   --   --   --   --    < > = values in this interval not displayed.      INFECTIOUS Recent Labs  Lab 03/13/21 0914 03/13/21 0915 03/14/21 0514  LATICACIDVEN 1.2  --   --   PROCALCITON  --  3.86 4.80     ENDOCRINE CBG (last 3)  Recent Labs    03/13/21 2340 03/14/21 0320 03/14/21 0756  GLUCAP 100* 128* 167*         IMAGING x48h  - image(s) personally visualized  -   highlighted in bold CT ABDOMEN PELVIS WO CONTRAST  Result Date: 03/13/2021 CLINICAL DATA:  Anemia, low hemoglobin, renal failure EXAM: CT ABDOMEN AND PELVIS WITHOUT CONTRAST TECHNIQUE: Multidetector CT imaging of the abdomen and pelvis was performed following the standard protocol without IV contrast. COMPARISON:  02/12/2021 FINDINGS: Lower chest: Extensive heterogeneous and consolidative airspace opacity in the included bilateral lung bases with  a small left pleural effusion. Coronary artery calcifications. Hepatobiliary: No solid liver abnormality is seen. Tiny gallstones in the dependent gallbladder. Gallbladder wall thickening, or biliary dilatation. Pancreas: Unremarkable. No pancreatic ductal dilatation or surrounding inflammatory changes. Spleen: Normal in size without significant abnormality. Adrenals/Urinary Tract: Adrenal glands are unremarkable. Kidneys are normal, without renal calculi, solid lesion, or hydronephrosis. Urinary bladder is decompressed by Foley catheter. Stomach/Bowel: Stomach is within normal limits. Appendix is not clearly visualized. No evidence of bowel wall thickening, distention, or inflammatory changes. The colon is fluid-filled to the rectum. Vascular/Lymphatic: Aortic atherosclerosis. No enlarged abdominal or pelvic lymph nodes. Reproductive: No mass or other significant abnormality. Other: Anasarca. There is intermediate attenuation (HU = 29) fluid in the right lower quadrant, which appears to be extraperitoneal, overlying the right psoas and external iliac artery (series 2, image 75). Musculoskeletal: No acute or significant osseous findings. IMPRESSION:  1. There is intermediate attenuation fluid in the right lower quadrant, which appears to be extraperitoneal, overlying the right psoas and external iliac artery. This is of uncertain etiology and may reflect a small hematoma, particularly if there has been recent vascular access in the right groin. The presence or absence of active bleeding is generally not established by noncontrast CT. 2. Extensive heterogeneous and consolidative airspace opacity in the included bilateral lung bases with a small left pleural effusion, consistent with infection or aspiration. 3. Anasarca. 4. Cholelithiasis. 5. Coronary artery disease. Aortic Atherosclerosis (ICD10-I70.0). Electronically Signed   By: Eddie Candle M.D.   On: 03/13/2021 14:13   DG Abd 1 View  Result Date: 03/12/2021 CLINICAL DATA:  OG tube placement EXAM: ABDOMEN - 1 VIEW COMPARISON:  03/10/2021 FINDINGS: Enteric tube with the tip projecting over the antrum of the stomach. No bowel dilatation to suggest obstruction. No evidence of pneumoperitoneum, portal venous gas or pneumatosis. No pathologic calcifications along the expected course of the ureters. No acute osseous abnormality. IMPRESSION: Enteric tube with the tip projecting over the antrum of the stomach. Electronically Signed   By: Kathreen Devoid   On: 03/12/2021 10:17   CT HEAD WO CONTRAST  Result Date: 03/12/2021 CLINICAL DATA:  Delirium. EXAM: CT HEAD WITHOUT CONTRAST TECHNIQUE: Contiguous axial images were obtained from the base of the skull through the vertex without intravenous contrast. COMPARISON:  None. FINDINGS: Brain: No evidence of acute infarction, hemorrhage, hydrocephalus, extra-axial collection or mass lesion/mass effect. Mild atrophy with ex vacuo ventricular dilation. Vascular: No hyperdense vessel identified. Mild calcific intracranial atherosclerosis. Skull: No acute fracture. Sinuses/Orbits: Visualized sinuses are clear. Other: Prominent soft tissue/fluid in the posterior nasopharynx,  partially imaged. IMPRESSION: 1. No evidence of acute intracranial abnormality. Mild atrophy. 2. Prominent soft tissue/fluid in the posterior nasopharynx, possibly adenoid hypertrophy and layering secretions; however, this area is incompletely imaged. Recommend correlation with direct inspection. 3. Small left mastoid effusion. Electronically Signed   By: Margaretha Sheffield MD   On: 03/12/2021 15:45   DG CHEST PORT 1 VIEW  Result Date: 03/14/2021 CLINICAL DATA:  Acute hypoxic respiratory failure with sepsis. History of HIV/aids, cystic kidney disease, asthma, diabetes. EXAM: PORTABLE CHEST 1 VIEW COMPARISON:  Chest x-rays dated 03/13/2021 and 03/11/2021. FINDINGS: Tubes and lines appear stable in position. Bilateral airspace opacities are not significantly changed, again most dense/confluent in the RIGHT upper lobe. Suspect small LEFT pleural effusion. No pneumothorax is seen. Heart size and mediastinal contours appear stable. IMPRESSION: No significant interval change. Bilateral airspace opacities, most dense/confluent in the RIGHT upper lobe, compatible with multifocal pneumonia. Probable  small LEFT pleural effusion. Electronically Signed   By: Franki Cabot M.D.   On: 03/14/2021 08:32   DG CHEST PORT 1 VIEW  Result Date: 03/13/2021 CLINICAL DATA:  Respiratory failure.  No pneumonia EXAM: PORTABLE CHEST 1 VIEW COMPARISON:  03/11/2021 FINDINGS: Endotracheal tube terminates 4.0 cm above carina. Nasogastric tube extends beyond the inferior aspect of the film. Right internal jugular line tip at mid to low SVC. Normal heart size. No pleural effusion or pneumothorax. Multifocal airspace disease, greater right than left. Relatively similar. IMPRESSION: No significant change in multifocal bilateral pneumonia. Electronically Signed   By: Abigail Miyamoto M.D.   On: 03/13/2021 11:35   DG FLUORO GUIDE LUMBAR PUNCTURE  Result Date: 03/13/2021 Enrique Sack, MD     03/13/2021  4:34 PM Fluroscopic guided Lumbar puncture  performed at L3-4.  18 gauge spinal needle used.  OP 38 cm H20.  CP 32 cm H20.  14 cc clear, colorless CSF removed and sent to lab.  The patient tolerated the procedure well with no immediate complications.

## 2021-03-14 NOTE — Progress Notes (Signed)
[facility]    Date of Admission:  03/04/2021   Total days of antibiotics 10          ID: REINHARD SCHACK is a 45 y.o. 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 On FiO2 50%, on CRRT. Underwent LP 1 wbc, crypto ag negative  Medications:   sodium chloride   Intravenous Once   alteplase  2 mg Intracatheter Once   alteplase  2 mg Intracatheter Once   chlorhexidine gluconate (MEDLINE KIT)  15 mL Mouth Rinse BID   Chlorhexidine Gluconate Cloth  6 each Topical Daily   clonazePAM  1 mg Per Tube BID   darbepoetin (ARANESP) injection - NON-DIALYSIS  40 mcg Subcutaneous Q Sun-1800   dolutegravir  50 mg Per Tube Daily   emtricitabine-tenofovir AF  1 tablet Per Tube Daily   feeding supplement (PIVOT 1.5 CAL)  1,000 mL Per Tube Q24H   heparin  5,000 Units Subcutaneous Q8H   insulin aspart  0-15 Units Subcutaneous Q4H   ipratropium-albuterol  3 mL Nebulization Q4H   mouth rinse  15 mL Mouth Rinse 10 times per day   oxyCODONE  5 mg Per Tube Q6H   pantoprazole sodium  40 mg Per Tube QHS   primaquine  30 mg Oral Daily   sodium chloride flush  10-40 mL Intracatheter Q12H    Objective: Vital signs in last 24 hours: Temp:  [95.18 F (35.1 C)-101.3 F (38.5 C)] 96.44 F (35.8 C) (07/10 1600) Pulse Rate:  [87-110] 98 (07/10 1600) Resp:  [19-21] 20 (07/10 1600) BP: (88-130)/(31-61) 113/50 (07/10 1600) SpO2:  [93 %-99 %] 94 % (07/10 1600) FiO2 (%):  [50 %] 50 % (07/10 1539) Weight:  [97.5 kg] 97.5  kg (07/10 0500) Physical Exam  Constitutional: intubated sedated He appears well-developed and well-nourished. No distress.  HENT:  Mouth/Throat: OETT in place Cardiovascular: Normal rate, regular rhythm and normal heart sounds. Exam reveals no gallop and no friction rub.  No murmur heard.  Pulmonary/Chest: Effort normal and breath sounds normal. No respiratory distress. He has no wheezes.  Abdominal: Soft. Bowel sounds are normal. He exhibits no distension. There is no tenderness.  Lymphadenopathy:  He has no cervical adenopathy.  Skin: Skin is warm and dry. No rash noted. No erythema.     Lab Results Recent Labs    03/13/21 2048 03/13/21 2359 03/14/21 0514 03/14/21 1625  WBC 13.1*  --  13.1*  --   HGB 7.7*  --  7.5*  --   HCT 26.9*  --  25.6*  --   NA  --    < > 135  135 136  K  --    < > 4.3  4.3 4.5  CL  --    < > 102  102 103  CO2  --    < > 25  25 26   BUN  --    < > 70*  71* 53*  CREATININE  --    < >  2.80*  2.76* 2.23*   < > = values in this interval not displayed.   Liver Panel Recent Labs    03/13/21 1519 03/13/21 1730 03/14/21 0514 03/14/21 1625  PROT 6.7  --  6.5  --   ALBUMIN 1.8*  1.8*   < > 1.8*  1.8* 1.8*  AST 26  --  24  --   ALT 6  --  5  --   ALKPHOS 91  --  91  --   BILITOT 0.6  --  0.6  --    < > = values in this interval not displayed.     Microbiology: Fungitell >500 Studies/Results: CT ABDOMEN PELVIS WO CONTRAST  Result Date: 03/13/2021 CLINICAL DATA:  Anemia, low hemoglobin, renal failure EXAM: CT ABDOMEN AND PELVIS WITHOUT CONTRAST TECHNIQUE: Multidetector CT imaging of the abdomen and pelvis was performed following the standard protocol without IV contrast. COMPARISON:  02/12/2021 FINDINGS: Lower chest: Extensive heterogeneous and consolidative airspace opacity in the included bilateral lung bases with a small left pleural effusion. Coronary artery calcifications. Hepatobiliary: No solid liver abnormality is seen. Tiny gallstones  in the dependent gallbladder. Gallbladder wall thickening, or biliary dilatation. Pancreas: Unremarkable. No pancreatic ductal dilatation or surrounding inflammatory changes. Spleen: Normal in size without significant abnormality. Adrenals/Urinary Tract: Adrenal glands are unremarkable. Kidneys are normal, without renal calculi, solid lesion, or hydronephrosis. Urinary bladder is decompressed by Foley catheter. Stomach/Bowel: Stomach is within normal limits. Appendix is not clearly visualized. No evidence of bowel wall thickening, distention, or inflammatory changes. The colon is fluid-filled to the rectum. Vascular/Lymphatic: Aortic atherosclerosis. No enlarged abdominal or pelvic lymph nodes. Reproductive: No mass or other significant abnormality. Other: Anasarca. There is intermediate attenuation (HU = 29) fluid in the right lower quadrant, which appears to be extraperitoneal, overlying the right psoas and external iliac artery (series 2, image 75). Musculoskeletal: No acute or significant osseous findings. IMPRESSION: 1. There is intermediate attenuation fluid in the right lower quadrant, which appears to be extraperitoneal, overlying the right psoas and external iliac artery. This is of uncertain etiology and may reflect a small hematoma, particularly if there has been recent vascular access in the right groin. The presence or absence of active bleeding is generally not established by noncontrast CT. 2. Extensive heterogeneous and consolidative airspace opacity in the included bilateral lung bases with a small left pleural effusion, consistent with infection or aspiration. 3. Anasarca. 4. Cholelithiasis. 5. Coronary artery disease. Aortic Atherosclerosis (ICD10-I70.0). Electronically Signed   By: Eddie Candle M.D.   On: 03/13/2021 14:13   DG CHEST PORT 1 VIEW  Result Date: 03/14/2021 CLINICAL DATA:  Acute hypoxic respiratory failure with sepsis. History of HIV/aids, cystic kidney disease, asthma, diabetes.  EXAM: PORTABLE CHEST 1 VIEW COMPARISON:  Chest x-rays dated 03/13/2021 and 03/11/2021. FINDINGS: Tubes and lines appear stable in position. Bilateral airspace opacities are not significantly changed, again most dense/confluent in the RIGHT upper lobe. Suspect small LEFT pleural effusion. No pneumothorax is seen. Heart size and mediastinal contours appear stable. IMPRESSION: No significant interval change. Bilateral airspace opacities, most dense/confluent in the RIGHT upper lobe, compatible with multifocal pneumonia. Probable small LEFT pleural effusion. Electronically Signed   By: Franki Cabot M.D.   On: 03/14/2021 08:32   DG CHEST PORT 1 VIEW  Result Date: 03/13/2021 CLINICAL DATA:  Respiratory failure.  No pneumonia EXAM: PORTABLE CHEST 1 VIEW COMPARISON:  03/11/2021 FINDINGS: Endotracheal tube terminates 4.0 cm above carina. Nasogastric tube extends beyond the inferior aspect of  the film. Right internal jugular line tip at mid to low SVC. Normal heart size. No pleural effusion or pneumothorax. Multifocal airspace disease, greater right than left. Relatively similar. IMPRESSION: No significant change in multifocal bilateral pneumonia. Electronically Signed   By: Abigail Miyamoto M.D.   On: 03/13/2021 11:35   DG FLUORO GUIDE LUMBAR PUNCTURE  Result Date: 03/13/2021 Enrique Sack, MD     03/13/2021  4:34 PM Fluroscopic guided Lumbar puncture performed at L3-4.  18 gauge spinal needle used.  OP 38 cm H20.  CP 32 cm H20.  14 cc clear, colorless CSF removed and sent to lab.  The patient tolerated the procedure well with no immediate complications.    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 Since not improving, consider getting bronch for repeat cultures aerobic, fungal cx, and pcp PCR  MSSA pneumonia = will change ceftriaxone back to cefazolin (initially escalated for concern of CNS  infection)  Fungitell = marker of invasive fungal infection, can see with pjp  Eso candidiasis, presumed = continue on fluconazole  HIV disease= continue on tivicay and descovy  CNS infection = ruled out, will d/c acyclovir  Aki due to ATN, HIV = continue on CRRT  [facility] Cell: [phone] Pager: [phone]  03/14/2021, 5:05 PM

## 2021-03-14 NOTE — Progress Notes (Signed)
Nephrology Follow-Up Consult note   Assessment/Recommendations: Bruce Hunter is a/an 45 y.o. male with a past medical history significant for HIV/AIDS, CKD, asthma, obesity, DM2 admitted for acute hypoxic respiratory failure with sepsis.      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  CKD with baseline of 2.5-3 (thus expected to rise at least to same).  Did have some urinary retention now with Foley catheter.   - Continue CRRT.  UF goal keep even for now - Change to 4K fluids  - Agree with Foley catheter given urinary retention  Acute hypoxic respiratory failure: Related to pneumonia and possibly some volume overload.  Optimize volume as above and treatment of pneumonia per primary team   Septic shock: Antibiotics per primary team  Pneumonia: Concern for PJP.  Antibiotics and management per primary  HIV: ID managing  Hypertension: History of such.  Holding home blood pressure medications given hx of septic shock  DM2: Insulin management per primary  Anemia macrocytic. Likely multifactorial with infection contributing. Transfuse PRBC's as needed.  Started aranesp 40 mcg on 7/10 ___________________________________________________________   Interval History/Subjective: started on CRRT on 7/9 after acute decompensation after mucous plugging event.  He had 2.9 liters UF over 7/9 with CRRT and he had 0.2 liters UOP over 7/9.  Heparin added to circuit overnight.  I changed to 2K fluids per trends yesterday.  He has been on levo at 23 mcg/min.   Review of systems: Unable to obtain 2/2 AMS   Medications:  Current Facility-Administered Medications  Medication Dose Route Frequency Provider Last Rate Last Admin   0.9 %  sodium chloride infusion (Manually program via Guardrails IV Fluids)   Intravenous Once Brand Males, MD       0.9 %  sodium chloride infusion  250 mL Intravenous Continuous Candee Furbish, MD 10 mL/hr at 03/10/21 0111 Infusion  Verify at 03/10/21 0111   0.9 %  sodium chloride infusion  250 mL Intravenous Continuous Anders Simmonds, MD 10 mL/hr at 03/14/21 1100 Infusion Verify at 03/14/21 1100   acetaminophen (TYLENOL) 160 MG/5ML solution 650 mg  650 mg Per Tube Q6H PRN Tanda Rockers, MD   650 mg at 03/13/21 1656   acyclovir (ZOVIRAX) 900 mg in dextrose 5 % 150 mL IVPB  900 mg Intravenous Q12H Polly Cobia, RPH   Stopped at 03/14/21 0519   albuterol (PROVENTIL) (2.5 MG/3ML) 0.083% nebulizer solution 2.5 mg  2.5 mg Nebulization Q2H PRN Collier Bullock, MD       alteplase (CATHFLO ACTIVASE) injection 2 mg  2 mg Intracatheter Once Tanda Rockers, MD       alteplase (CATHFLO ACTIVASE) injection 2 mg  2 mg Intracatheter Once Roney Jaffe, MD       cefTRIAXone (ROCEPHIN) 2 g in sodium chloride 0.9 % 100 mL IVPB  2 g Intravenous Q12H Corey Harold, NP   Stopped at 03/14/21 0931   chlorhexidine gluconate (MEDLINE KIT) (PERIDEX) 0.12 % solution 15 mL  15 mL Mouth Rinse BID Candee Furbish, MD   15 mL at 03/14/21 7494   Chlorhexidine Gluconate Cloth 2 % PADS 6 each  6 each Topical Daily Brand Males, MD   6 each at 03/14/21 0625   clindamycin (CLEOCIN) IVPB 900 mg  900 mg Intravenous Q8H Carlyle Basques, MD   Stopped at 03/14/21 0553   clonazePAM (KLONOPIN) tablet 1 mg  1 mg Per Tube BID Hunsucker, Bonna Gains, MD  1 mg at 03/14/21 1045   dexmedetomidine (PRECEDEX) 200 MCG/50ML (4 mcg/mL) infusion  0.4-1.2 mcg/kg/hr Intravenous Titrated Frederik Pear, MD 16.63 mL/hr at 03/14/21 1008 0.7 mcg/kg/hr at 03/14/21 1008   docusate (COLACE) 50 MG/5ML liquid 100 mg  100 mg Per Tube BID PRN Tanda Rockers, MD   100 mg at 03/08/21 1358   dolutegravir (TIVICAY) tablet 50 mg  50 mg Per Tube Daily Tanda Rockers, MD   50 mg at 03/14/21 1045   emtricitabine-tenofovir AF (DESCOVY) 200-25 MG per tablet 1 tablet  1 tablet Per Tube Daily Carlyle Basques, MD   1 tablet at 03/14/21 1045   feeding supplement (PIVOT 1.5 CAL) liquid  1,000 mL  1,000 mL Per Tube Q24H Hunsucker, Bonna Gains, MD 60 mL/hr at 03/14/21 0932 1,000 mL at 03/14/21 0932   fentaNYL (SUBLIMAZE) bolus via infusion 200 mcg  200 mcg Intravenous Q1H PRN Hunsucker, Bonna Gains, MD   200 mcg at 03/12/21 1514   fentaNYL 2541mg in NS 2558m(1030mml) infusion-PREMIX  0-400 mcg/hr Intravenous Continuous IzqTyna JakschD 40 mL/hr at 03/14/21 1100 400 mcg/hr at 03/14/21 1100   fluconazole (DIFLUCAN) IVPB 400 mg  400 mg Intravenous Q24H WofPolly CobiaPH   Stopped at 03/14/21 0006   haloperidol lactate (HALDOL) injection 5 mg  5 mg Intravenous Q6H PRN RamBrand MalesD       heparin 10,000 units/ 20 mL infusion syringe  250-3,000 Units/hr CRRT Continuous SinGean QuintD 2.4 mL/hr at 03/14/21 0926 1,200 Units/hr at 03/14/21 0926   heparin bolus via infusion syringe 1,000 Units  1,000 Units CRRT PRN SinGean QuintD   1,000 Units at 03/13/21 2328   heparin injection 1,000-6,000 Units  1,000-6,000 Units CRRT PRN FosClaudia DesanctisD       heparin injection 5,000 Units  5,000 Units Subcutaneous Q8H GonCollier BullockD   5,000 Units at 03/13/21 1657   insulin aspart (novoLOG) injection 0-15 Units  0-15 Units Subcutaneous Q4H Ollis, Brandi L, NP   3 Units at 03/14/21 0801   ipratropium-albuterol (DUONEB) 0.5-2.5 (3) MG/3ML nebulizer solution 3 mL  3 mL Nebulization Q4H IzqTyna JakschD   3 mL at 03/14/21 1101   MEDLINE mouth rinse  15 mL Mouth Rinse 10 times per day SmiCandee FurbishD   15 mL at 03/14/21 1046   midazolam (VERSED) 50 mg/50 mL (1 mg/mL) premix infusion  0.5-10 mg/hr Intravenous Continuous RamBrand MalesD 3 mL/hr at 03/14/21 1100 3 mg/hr at 03/14/21 1100   midazolam (VERSED) injection 1-4 mg  1-4 mg Intravenous Q2H PRN RamBrand MalesD       midazolam (VERSED) injection 2 mg  2 mg Intravenous Q2H PRN SmiMauri BrooklynD       norepinephrine (LEVOPHED) 16 mg in 250m36memix infusion  0-40 mcg/min Intravenous Titrated Green, Terri L, RPH  21.6 mL/hr at 03/14/21 1100 23 mcg/min at 03/14/21 1100   ondansetron (ZOFRAN) injection 4 mg  4 mg Intravenous Q6H PRN GonzCollier Bullock       oxyCODONE (ROXICODONE) 5 MG/5ML solution 5 mg  5 mg Per Tube Q6H HoffCorey Harold   5 mg at 03/14/21 1045   pantoprazole sodium (PROTONIX) 40 mg/20 mL oral suspension 40 mg  40 mg Per Tube QHS BellLeodis SiasRPH   40 mg at 03/13/21 2233   polyethylene glycol (MIRALAX / GLYCOLAX) packet 17 g  17 g Per Tube Daily PRN WertTanda Rockers  MD       primaquine tablet 30 mg  30 mg Oral Daily Carlyle Basques, MD   30 mg at 03/14/21 1045   prismasol BGK 2/2.5 dialysis solution   CRRT Continuous Claudia Desanctis, MD 2,000 mL/hr at 03/14/21 1055 New Bag at 03/14/21 [place] 2/2.5 replacement solution   CRRT Continuous Claudia Desanctis, MD 400 mL/hr at 03/14/21 0630 New Bag at 03/14/21 0630   prismasol BGK 2/2.5 replacement solution   CRRT Continuous Claudia Desanctis, MD 400 mL/hr at 03/14/21 0630 New Bag at 03/14/21 0630   sodium chloride flush (NS) 0.9 % injection 10-40 mL  10-40 mL Intracatheter Q12H Tanda Rockers, MD   10 mL at 03/14/21 1046   sodium chloride flush (NS) 0.9 % injection 10-40 mL  10-40 mL Intracatheter PRN Tanda Rockers, MD         Physical Exam: Vitals:   03/14/21 1104 03/14/21 1115  BP:  (!) 117/51  Pulse:  (!) 103  Resp:  20  Temp:  (!) 97.52 F (36.4 C)  SpO2: 96% 96%   Total I/O In: 691.8 [I.V.:296.8; NG/GT:295; IV Piggyback:100] Out: 539 [Urine:48; Other:491]  Intake/Output Summary (Last 24 hours) at 03/14/2021 1129 Last data filed at 03/14/2021 1100 Gross per 24 hour  Intake 5216.06 ml  Output 3606.4 ml  Net 1609.66 ml   General adult male in bed intubated   HEENT normocephalic atraumatic  Neck supple trachea midline Lungs coarse mechanical breath sounds FIO2 50 and PEEP 8 Heart S1S2 no rub Abdomen soft nontender with limits of sedation distended Extremities no edema lower extremities; upper extremities 1+  edema  Neuro sedation running Access: RIJ nontunneled catheter in place   Test Results I personally reviewed new and old clinical labs and radiology tests Lab Results  Component Value Date   NA 135 03/14/2021   NA 135 03/14/2021   K 4.3 03/14/2021   K 4.3 03/14/2021   CL 102 03/14/2021   CL 102 03/14/2021   CO2 25 03/14/2021   CO2 25 03/14/2021   BUN 71 (H) 03/14/2021   BUN 70 (H) 03/14/2021   CREATININE 2.76 (H) 03/14/2021   CREATININE 2.80 (H) 03/14/2021   CALCIUM 8.1 (L) 03/14/2021   CALCIUM 8.0 (L) 03/14/2021   ALBUMIN 1.8 (L) 03/14/2021   ALBUMIN 1.8 (L) 03/14/2021   PHOS 6.8 (H) 03/14/2021     Claudia Desanctis, MD 03/14/2021 11:44 AM

## 2021-03-15 ENCOUNTER — Inpatient Hospital Stay (HOSPITAL_COMMUNITY): Payer: Medicaid Other

## 2021-03-15 DIAGNOSIS — B20 Human immunodeficiency virus [HIV] disease: Secondary | ICD-10-CM | POA: Diagnosis not present

## 2021-03-15 DIAGNOSIS — N179 Acute kidney failure, unspecified: Secondary | ICD-10-CM | POA: Diagnosis not present

## 2021-03-15 DIAGNOSIS — G9341 Metabolic encephalopathy: Secondary | ICD-10-CM | POA: Diagnosis not present

## 2021-03-15 DIAGNOSIS — J9601 Acute respiratory failure with hypoxia: Secondary | ICD-10-CM | POA: Diagnosis not present

## 2021-03-15 LAB — POCT ACTIVATED CLOTTING TIME
Activated Clotting Time: 184 seconds
Activated Clotting Time: 202 seconds
Activated Clotting Time: 202 seconds
Activated Clotting Time: 208 seconds
Activated Clotting Time: 196 seconds
Activated Clotting Time: 161 seconds
Activated Clotting Time: 190 seconds
Activated Clotting Time: 213 seconds
Activated Clotting Time: 213 seconds
Activated Clotting Time: 213 seconds
Activated Clotting Time: 213 seconds

## 2021-03-15 LAB — RENAL FUNCTION PANEL
Albumin: 1.5 g/dL — ABNORMAL LOW (ref 3.5–5.0)
Albumin: 1.7 g/dL — ABNORMAL LOW (ref 3.5–5.0)
Anion gap: 7 (ref 5–15)
Anion gap: 5 (ref 5–15)
BUN: 41 mg/dL — ABNORMAL HIGH (ref 6–20)
BUN: 37 mg/dL — ABNORMAL HIGH (ref 6–20)
CO2: 26 mmol/L (ref 22–32)
CO2: 27 mmol/L (ref 22–32)
Calcium: 7.7 mg/dL — ABNORMAL LOW (ref 8.9–10.3)
Calcium: 7.8 mg/dL — ABNORMAL LOW (ref 8.9–10.3)
Chloride: 103 mmol/L (ref 98–111)
Chloride: 103 mmol/L (ref 98–111)
Creatinine, Ser: 1.82 mg/dL — ABNORMAL HIGH (ref 0.61–1.24)
Creatinine, Ser: 1.83 mg/dL — ABNORMAL HIGH (ref 0.61–1.24)
GFR, Estimated: 46 mL/min — ABNORMAL LOW (ref >60)
GFR, Estimated: 46 mL/min — ABNORMAL LOW (ref >60)
Glucose, Bld: 151 mg/dL — ABNORMAL HIGH (ref 70–99)
Glucose, Bld: 204 mg/dL — ABNORMAL HIGH (ref 70–99)
Phosphorus: 3 mg/dL (ref 2.5–4.6)
Phosphorus: 2.4 mg/dL — ABNORMAL LOW (ref 2.5–4.6)
Potassium: 4.5 mmol/L (ref 3.5–5.1)
Potassium: 5 mmol/L (ref 3.5–5.1)
Sodium: 136 mmol/L (ref 135–145)
Sodium: 135 mmol/L (ref 135–145)

## 2021-03-15 LAB — APTT: aPTT: 98 seconds — ABNORMAL HIGH (ref 24–36)

## 2021-03-15 LAB — DIFFERENTIAL
Abs Immature Granulocytes: 0.14 10*3/uL — ABNORMAL HIGH (ref 0.00–0.07)
Band Neutrophils: 0 %
Basophils Absolute: 0 10*3/uL (ref 0.0–0.1)
Basophils Relative: 0 %
Blasts: 0 %
Eosinophils Absolute: 0.4 10*3/uL (ref 0.0–0.5)
Eosinophils Relative: 3 %
Lymphocytes Relative: 5 %
Lymphs Abs: 0.7 10*3/uL (ref 0.7–4.0)
Metamyelocytes Relative: 1 %
Monocytes Absolute: 2.7 10*3/uL — ABNORMAL HIGH (ref 0.1–1.0)
Monocytes Relative: 19 %
Myelocytes: 0 %
Neutro Abs: 10.2 10*3/uL — ABNORMAL HIGH (ref 1.7–7.7)
Neutrophils Relative %: 72 %
Other: 0 %
Promyelocytes Relative: 0 %
nRBC: 0 /100 WBC

## 2021-03-15 LAB — VARICELLA ZOSTER ANTIBODY, IGM: Varicella-Zoster Ab, IgM: <0.91 index (ref 0.00–0.90)

## 2021-03-15 LAB — GLUCOSE, CAPILLARY
Glucose-Capillary: 124 mg/dL — ABNORMAL HIGH (ref 70–99)
Glucose-Capillary: 128 mg/dL — ABNORMAL HIGH (ref 70–99)
Glucose-Capillary: 137 mg/dL — ABNORMAL HIGH (ref 70–99)
Glucose-Capillary: 159 mg/dL — ABNORMAL HIGH (ref 70–99)
Glucose-Capillary: 165 mg/dL — ABNORMAL HIGH (ref 70–99)
Glucose-Capillary: 169 mg/dL — ABNORMAL HIGH (ref 70–99)
Glucose-Capillary: 210 mg/dL — ABNORMAL HIGH (ref 70–99)

## 2021-03-15 LAB — DIC (DISSEMINATED INTRAVASCULAR COAGULATION)PANEL
D-Dimer, Quant: 11.13 ug/mL-FEU — ABNORMAL HIGH (ref 0.00–0.50)
Fibrinogen: >800 mg/dL — ABNORMAL HIGH (ref 210–475)
INR: 1 (ref 0.8–1.2)
Platelets: 127 10*3/uL — ABNORMAL LOW (ref 150–400)
Prothrombin Time: 13.1 seconds (ref 11.4–15.2)
Smear Review: NONE SEEN
aPTT: 64 seconds — ABNORMAL HIGH (ref 24–36)

## 2021-03-15 LAB — CBC
HCT: 24.2 % — ABNORMAL LOW (ref 39.0–52.0)
Hemoglobin: 7.1 g/dL — ABNORMAL LOW (ref 13.0–17.0)
MCH: 28.6 pg (ref 26.0–34.0)
MCHC: 29.3 g/dL — ABNORMAL LOW (ref 30.0–36.0)
MCV: 97.6 fL (ref 80.0–100.0)
Platelets: 133 10*3/uL — ABNORMAL LOW (ref 150–400)
RBC: 2.48 MIL/uL — ABNORMAL LOW (ref 4.22–5.81)
RDW: 20.8 % — ABNORMAL HIGH (ref 11.5–15.5)
WBC: 14.1 10*3/uL — ABNORMAL HIGH (ref 4.0–10.5)
nRBC: 0.4 % — ABNORMAL HIGH (ref 0.0–0.2)

## 2021-03-15 LAB — MAGNESIUM: Magnesium: 2.2 mg/dL (ref 1.7–2.4)

## 2021-03-15 LAB — HEPATIC FUNCTION PANEL
ALT: 7 U/L (ref 0–44)
AST: 30 U/L (ref 15–41)
Albumin: 1.7 g/dL — ABNORMAL LOW (ref 3.5–5.0)
Alkaline Phosphatase: 144 U/L — ABNORMAL HIGH (ref 38–126)
Bilirubin, Direct: 0.1 mg/dL (ref 0.0–0.2)
Indirect Bilirubin: 0.3 mg/dL (ref 0.3–0.9)
Total Bilirubin: 0.4 mg/dL (ref 0.3–1.2)
Total Protein: 6.7 g/dL (ref 6.5–8.1)

## 2021-03-15 LAB — RETICULOCYTES
Immature Retic Fract: 15.1 % (ref 2.3–15.9)
RBC.: 2.32 MIL/uL — ABNORMAL LOW (ref 4.22–5.81)
Retic Count, Absolute: 20.6 10*3/uL (ref 19.0–186.0)
Retic Ct Pct: 0.9 % (ref 0.4–3.1)

## 2021-03-15 LAB — CREATININE, SERUM
Creatinine, Ser: 1.86 mg/dL — ABNORMAL HIGH (ref 0.61–1.24)
GFR, Estimated: 45 mL/min — ABNORMAL LOW (ref >60)

## 2021-03-15 LAB — LACTATE DEHYDROGENASE: LDH: 186 U/L (ref 98–192)

## 2021-03-15 LAB — FERRITIN: Ferritin: 1361 ng/mL — ABNORMAL HIGH (ref 24–336)

## 2021-03-15 LAB — PATHOLOGIST SMEAR REVIEW

## 2021-03-15 LAB — PROCALCITONIN: Procalcitonin: 5.51 ng/mL

## 2021-03-15 LAB — LACTIC ACID, PLASMA: Lactic Acid, Venous: 1 mmol/L (ref 0.5–1.9)

## 2021-03-15 IMAGING — DX DG CHEST 1V PORT
1 series · 1 of 1 positions shown · non-contrast
Comparison: [DATE].

CLINICAL DATA: HIV, multifocal pneumonia.

EXAM:
PORTABLE CHEST 1 VIEW

[chest ap]
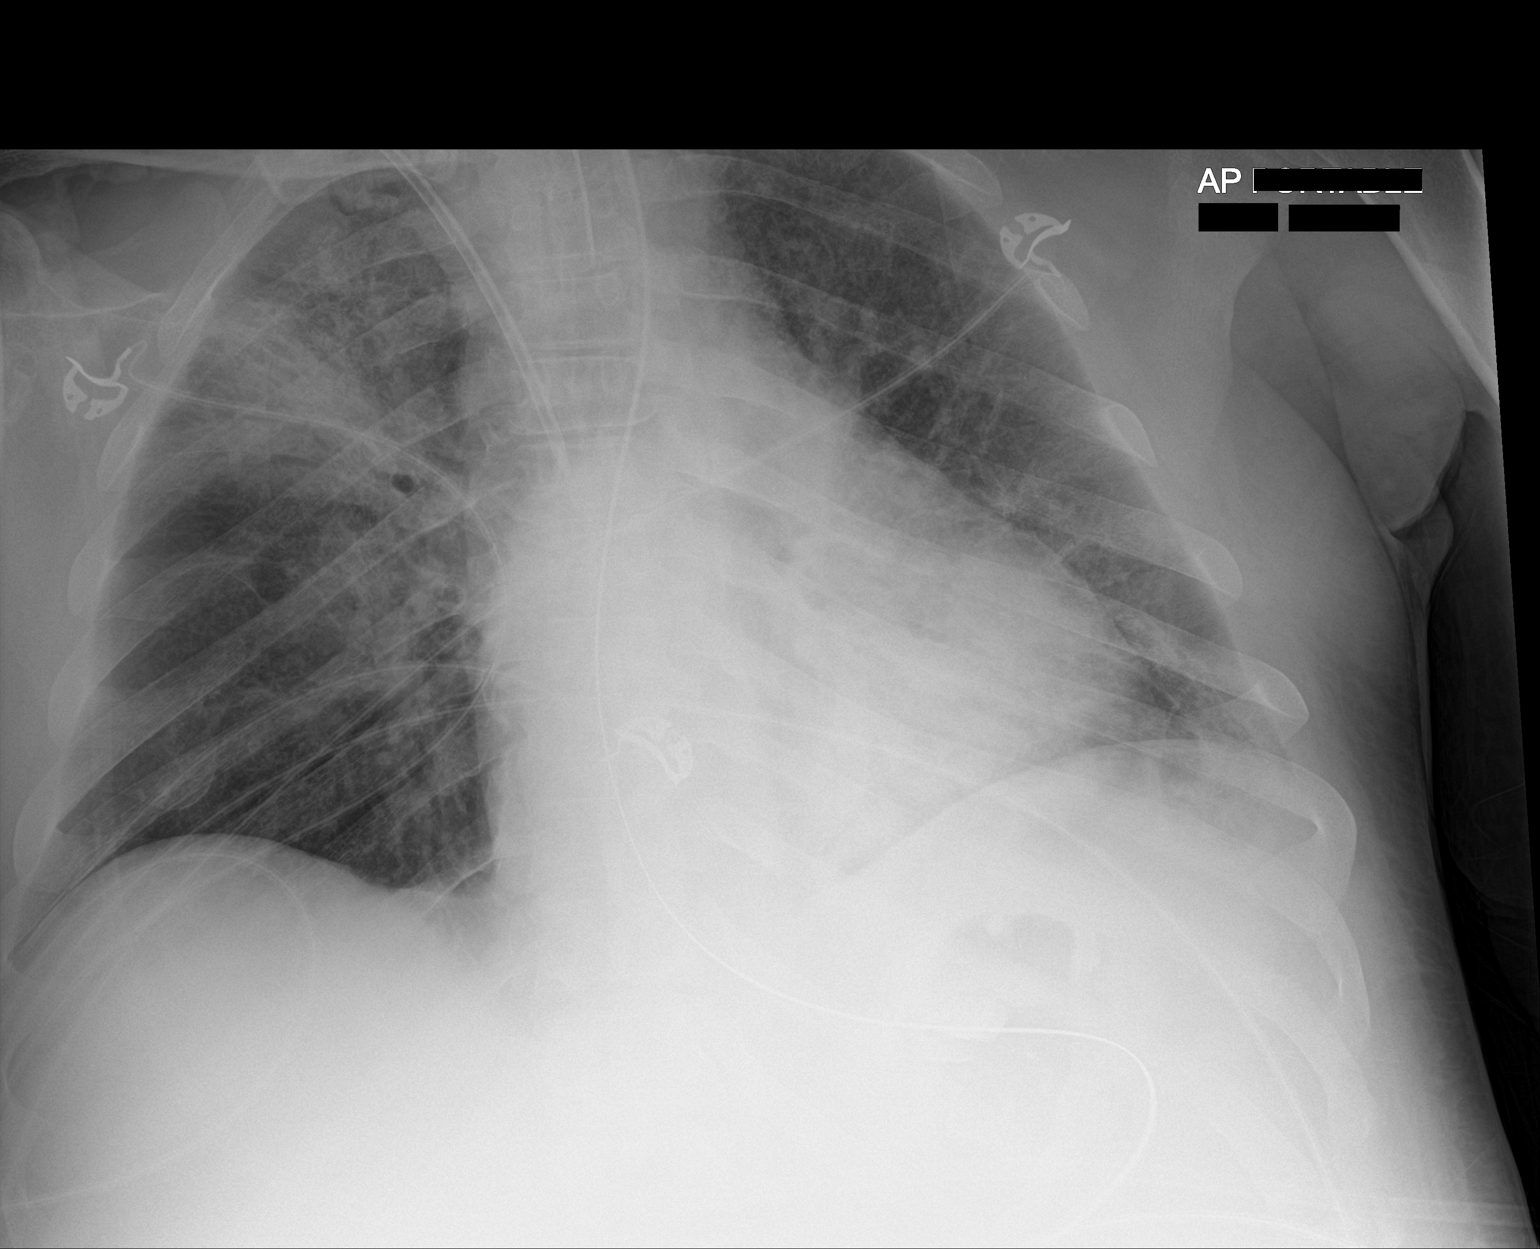

[1 of 1 positions shown; findings below may reference images not displayed]

FINDINGS: Endotracheal tube terminates 4.6 cm above the carina. Nasogastric
tube is followed into the stomach with the tip projecting beyond the
inferior margin of the image. Right IJ central line is in the SVC.
Heart is at the upper limits of normal in size to mildly enlarged,
stable. Patchy mixed interstitial and airspace opacification
bilaterally, worst in the right upper lobe. Left lower lobe aeration
has improved somewhat in the interval. No definite pleural fluid.
IMPRESSION: Multi lobar pneumonia with improving aeration in the left lower
lobe.

## 2021-03-15 MED ORDER — SODIUM CHLORIDE 0.9 % IV SOLN
2.0000 g | Freq: Two times a day (BID) | INTRAVENOUS | Status: DC
  Administered 2021-03-16 – 2021-03-18 (×5): 2 g via INTRAVENOUS
  Filled 2021-03-15 (×5): qty 2

## 2021-03-15 MED ORDER — METHYLPREDNISOLONE SODIUM SUCC 40 MG IJ SOLR
30.0000 mg | Freq: Two times a day (BID) | INTRAMUSCULAR | Status: DC
  Administered 2021-03-15 – 2021-03-18 (×6): 30 mg via INTRAVENOUS
  Filled 2021-03-15 (×6): qty 1

## 2021-03-15 MED ORDER — GABAPENTIN 250 MG/5ML PO SOLN: 300.0000 mg | Freq: Three times a day (TID) | ORAL | Status: DC

## 2021-03-15 MED ORDER — QUETIAPINE FUMARATE 50 MG PO TABS
25.0000 mg | ORAL_TABLET | Freq: Two times a day (BID) | ORAL | Status: DC
  Administered 2021-03-15 – 2021-03-17 (×6): 25 mg
  Filled 2021-03-15 (×6): qty 1

## 2021-03-15 MED ORDER — GABAPENTIN 250 MG/5ML PO SOLN
100.0000 mg | Freq: Two times a day (BID) | ORAL | Status: DC
  Administered 2021-03-15 – 2021-03-21 (×14): 100 mg
  Filled 2021-03-15 (×16): qty 2

## 2021-03-15 MED ORDER — CEFAZOLIN SODIUM-DEXTROSE 1-4 GM/50ML-% IV SOLN: 1.0000 g | Freq: Two times a day (BID) | INTRAVENOUS | Status: DC

## 2021-03-15 NOTE — Progress Notes (Signed)
Nephrology Follow-Up Consult note   Assessment/Recommendations: Bruce Hunter is a/an 45 y.o. male with a past medical history significant for HIV/AIDS, CKD, asthma, obesity, DM2 admitted for acute hypoxic respiratory failure with sepsis.      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  CKD with baseline of 2.5-3 (thus expected to rise at least to same).  Did have some urinary retention so Foley catheter placed. CRRT started on 7/02, held on 7/7 and 7/8 and resumed on 7/09. Continue CRRT.  UF goal kepp + 50 cc/hr.  Acute hypoxic respiratory failure: Related to pna primarily.  Rx of pneumonia per primary team. CXR's most cleared w/ some residual RUL infiltrates still.   Septic shock: Antibiotics per primary team, on levo gtt 9 ug/min today  Pneumonia: Concern for PJP.  Antibiotics and management per primary  HIV: ID managing  Hypertension: History of such.  Holding home blood pressure medications given hx of septic shock  DM2: Insulin management per primary  Anemia macrocytic. Likely multifactorial with infection contributing. Transfuse PRBC's prn. Started darbe 40 ug per wk.   Kelly Splinter, MD 03/15/2021, 12:42 PM      Interval History/Subjective:  on levo gtt 9 ug/min, 200 cc UOP yest  Review of systems: Unable to obtain 2/2 AMS   Medications:  Current Facility-Administered Medications  Medication Dose Route Frequency Provider Last Rate Last Admin    prismasol BGK 4/2.5 infusion   CRRT Continuous Claudia Desanctis, MD 500 mL/hr at 03/15/21 1150 New Bag at 03/15/21 [place] 4/2.5 infusion   CRRT Continuous Claudia Desanctis, MD 300 mL/hr at 03/15/21 0200 New Bag at 03/15/21 0200   0.9 %  sodium chloride infusion (Manually program via Guardrails IV Fluids)   Intravenous Once Brand Males, MD       0.9 %  sodium chloride infusion  250 mL Intravenous Continuous Candee Furbish, MD 10 mL/hr at 03/10/21 0111 Infusion Verify at  03/10/21 0111   0.9 %  sodium chloride infusion  250 mL Intravenous Continuous Anders Simmonds, MD 10 mL/hr at 03/15/21 1200 Infusion Verify at 03/15/21 1200   acetaminophen (TYLENOL) 160 MG/5ML solution 650 mg  650 mg Per Tube Q6H PRN Tanda Rockers, MD   650 mg at 03/13/21 1656   albuterol (PROVENTIL) (2.5 MG/3ML) 0.083% nebulizer solution 2.5 mg  2.5 mg Nebulization Q2H PRN Collier Bullock, MD       alteplase (CATHFLO ACTIVASE) injection 2 mg  2 mg Intracatheter Once Tanda Rockers, MD       alteplase (CATHFLO ACTIVASE) injection 2 mg  2 mg Intracatheter Once Roney Jaffe, MD       cefTRIAXone (ROCEPHIN) 2 g in sodium chloride 0.9 % 100 mL IVPB  2 g Intravenous Q12H Corey Harold, NP   Stopped at 03/15/21 0940   chlorhexidine gluconate (MEDLINE KIT) (PERIDEX) 0.12 % solution 15 mL  15 mL Mouth Rinse BID Candee Furbish, MD   15 mL at 03/15/21 0801   Chlorhexidine Gluconate Cloth 2 % PADS 6 each  6 each Topical Daily Brand Males, MD   6 each at 03/15/21 0432   clindamycin (CLEOCIN) IVPB 900 mg  900 mg Intravenous Q8H Carlyle Basques, MD   Stopped at 03/15/21 0537   clonazePAM (KLONOPIN) tablet 1 mg  1 mg Per Tube BID Hunsucker, Bonna Gains, MD   1 mg at 03/15/21 0918   Darbepoetin Alfa (ARANESP) injection 40 mcg  40 mcg Subcutaneous Q Sun-1800 Harrie Jeans C, MD   40 mcg at 03/14/21 1817   dexmedetomidine (PRECEDEX) 200 MCG/50ML (4 mcg/mL) infusion  0.4-1.2 mcg/kg/hr Intravenous Titrated Frederik Pear, MD 14.25 mL/hr at 03/15/21 0950 0.6 mcg/kg/hr at 03/15/21 0950   docusate (COLACE) 50 MG/5ML liquid 100 mg  100 mg Per Tube BID PRN Tanda Rockers, MD   100 mg at 03/08/21 1358   dolutegravir (TIVICAY) tablet 50 mg  50 mg Per Tube Daily Tanda Rockers, MD   50 mg at 03/15/21 1204   emtricitabine-tenofovir AF (DESCOVY) 200-25 MG per tablet 1 tablet  1 tablet Per Tube Daily Carlyle Basques, MD   1 tablet at 03/15/21 0918   feeding supplement (PIVOT 1.5 CAL) liquid 1,000 mL  1,000 mL  Per Tube Q24H Hunsucker, Bonna Gains, MD   Held at 03/15/21 1000   fentaNYL (SUBLIMAZE) bolus via infusion 200 mcg  200 mcg Intravenous Q1H PRN Hunsucker, Bonna Gains, MD   200 mcg at 03/14/21 2220   fentaNYL 2563mg in NS 2558m(1035mml) infusion-PREMIX  0-400 mcg/hr Intravenous Continuous IzqTyna JakschD 20 mL/hr at 03/15/21 1200 200 mcg/hr at 03/15/21 1200   fluconazole (DIFLUCAN) IVPB 400 mg  400 mg Intravenous Q24H WofPolly CobiaPH   Stopped at 03/15/21 0017   gabapentin (NEURONTIN) 250 MG/5ML solution 100 mg  100 mg Per Tube Q12H Bowser, GraLaurel DimmerP   100 mg at 03/15/21 1021   heparin 10,000 units/ 20 mL infusion syringe  250-3,000 Units/hr CRRT Continuous SinGean QuintD 3.9 mL/hr at 03/15/21 1221 1,950 Units/hr at 03/15/21 1221   heparin bolus via infusion syringe 1,000 Units  1,000 Units CRRT PRN SinGean QuintD   1,000 Units at 03/13/21 2328   heparin injection 1,000-6,000 Units  1,000-6,000 Units CRRT PRN FosClaudia DesanctisD       insulin aspart (novoLOG) injection 0-15 Units  0-15 Units Subcutaneous Q4H Ollis, Brandi L, NP   3 Units at 03/15/21 1203   ipratropium-albuterol (DUONEB) 0.5-2.5 (3) MG/3ML nebulizer solution 3 mL  3 mL Nebulization Q4H IzqTyna JakschD   3 mL at 03/15/21 1135   MEDLINE mouth rinse  15 mL Mouth Rinse 10 times per day SmiCandee FurbishD   15 mL at 03/15/21 1200   midazolam (VERSED) injection 2 mg  2 mg Intravenous Q2H PRN SmiMauri BrooklynD       norepinephrine (LEVOPHED) 16 mg in 250m72memix infusion  0-40 mcg/min Intravenous Titrated Green, Terri L, RPH 8.44 mL/hr at 03/15/21 1200 9 mcg/min at 03/15/21 1200   ondansetron (ZOFRAN) injection 4 mg  4 mg Intravenous Q6H PRN GonzCollier Bullock       oxyCODONE (ROXICODONE) 5 MG/5ML solution 5 mg  5 mg Per Tube Q6H HoffCorey Harold   5 mg at 03/15/21 0918   pantoprazole sodium (PROTONIX) 40 mg/20 mL oral suspension 40 mg  40 mg Per Tube QHS BellLeodis SiasRPH   40 mg at 03/14/21 2201    polyethylene glycol (MIRALAX / GLYCOLAX) packet 17 g  17 g Per Tube Daily PRN WertTanda Rockers       primaquine tablet 30 mg  30 mg Oral Daily SnidCarlyle Basques   30 mg at 03/15/21 [id number] BGK 4/2.5 infusion   CRRT Continuous FostClaudia Desanctis 2,000 mL/hr at 03/15/21 1155 New Bag at 03/15/21 1155   QUEtiapine (SEROQUEL) tablet 25 mg  25 mg Per Tube BID Cristal Generous, NP   25 mg at 03/15/21 1021   sodium chloride flush (NS) 0.9 % injection 10-40 mL  10-40 mL Intracatheter Q12H Tanda Rockers, MD   10 mL at 03/15/21 0931   sodium chloride flush (NS) 0.9 % injection 10-40 mL  10-40 mL Intracatheter PRN Tanda Rockers, MD         Physical Exam: Vitals:   03/15/21 1200 03/15/21 1215  BP: (!) 131/57 (!) 120/50  Pulse:    Resp: (!) 23 20  Temp: (!) 100.4 F (38 C) 100.22 F (37.9 C)  SpO2:     Total I/O In: 744.8 [I.V.:304.8; NG/GT:340; IV Piggyback:100] Out: 742 [Urine:12; Other:730]  Intake/Output Summary (Last 24 hours) at 03/15/2021 1235 Last data filed at 03/15/2021 1208 Gross per 24 hour  Intake 4002.17 ml  Output 4042 ml  Net -39.83 ml    General adult male in bed intubated   HEENT normocephalic atraumatic  Neck supple trachea midline Lungs coarse mechanical breath sounds FIO2 50 and PEEP 8 Heart S1S2 no rub Abdomen soft nontender with limits of sedation distended Extremities no edema lower extremities; upper extremities 1+ edema  Neuro sedation running Access: RIJ nontunneled catheter in place   Test Results I personally reviewed new and old clinical labs and radiology tests Lab Results  Component Value Date   NA 136 03/15/2021   K 4.5 03/15/2021   CL 103 03/15/2021   CO2 26 03/15/2021   BUN 41 (H) 03/15/2021   CREATININE 1.86 (H) 03/15/2021   CREATININE 1.82 (H) 03/15/2021   CALCIUM 7.7 (L) 03/15/2021   ALBUMIN 1.7 (L) 03/15/2021   PHOS 3.0 03/15/2021     [name], MD 03/15/2021 12:35 PM

## 2021-03-15 NOTE — Progress Notes (Signed)
NAME:  KACEY KABEL, MRN:  [id number], DOB:  03/01/1976, LOS: 11 ADMISSION DATE:  03/04/2021, CONSULTATION DATE:  03/04/21 REFERRING MD:  Melina Copa, CHIEF COMPLAINT:  Shortness of breath.     Brief Narrative:  45 yo man with HIV/AIDS here with acute hypoxemic respiratory failure.  Complaints of SOB and cough productive of tan sputum.  Has had respiratory symptoms since he was discharged earlier this month (6/13).     Recently diagnosed with HIV/AIDS, started on medications, but patient tells me he "hasn't had time to start taking them" but he isn't really able to explain further.  Patient was initiated on lamivudine, Tivicay, dapsone and fluconazole on discharge.  Patient does have an appointment with infectious disease as outpatient.  Candida esophagitis. At the time.    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 Mid June 2022 CD4 count is <35 and initial viral load of 2.7 million entering care at Stage 3. Genotype remains pending. Treated for candida esophagitis on entry to care. [id number] negative.  Initially started on Dovato and Dapsone. DTG plus lamivudine. He can get the combined pill, dovato, upon discharge. Can send rx for dovato for free 30 day supply Esophageal candidiasis = recommend to treat with fluconazole '400mg'$  po daily x 14-21 days Opportunistic infection proph = continue on dapsone '100mg'$  daily   Also baseline  - polyphamracy and neuropathic pain from HIV. DM. Also Marijuana usr   has a past medical history of Asthma, Diabetes mellitus without complication ([place]), Gout, Hyperlipidemia, Hypertension, and Obesity.   has a past surgical history that includes No past surgeries; Ganglion cyst excision (Right, 02/26/2020); Esophagogastroduodenoscopy (egd) with propofol (N/A, 02/15/2021); Colonoscopy with propofol (N/A, 02/15/2021); and biopsy (02/15/2021).   Pertinent  Medical History  Asthma DM2 Gout HTN Obesity   GERD AKI Neuropathy  Significant Hospital Events:  6/30 admitted for PNA with known HIV/AIDS 7/1 ET >>> Echo 7/1 mild  RA dilation with RV low nl fxn mild PAS elevation and nl LV 7/1 - renal consult, ID consult 7/2 HD catheter >>> 7/5, Fi O2 50%, Pneumocystis DFA negative 03/06/21 ([facility] [place]), MSSA on LRCx LUE ultrasound 7/5 > No DVT, acute superficial vein thrombosis L cephalic vein.  7/6 - ETT tube advancec by CRNA 7/7 - CRRT stopped 7/8 - Expanding antibiotics to include CNS coverage including acyclovir. CT non-acute. EEG pending. LP pending once off subcutaneous heparin x 24 hours. Will hold additional doses. Have discussed with RN. Last dose today at 1400.   7/9 = remains on ventilator.  FiO2 50%.  On infusion of propofol and fentanyl.  Blood pressure soft but has low diast.  Fluid bolus being given.  Tube feeds held because of pending lumbar puncture.  Has become hypoglycemic and D5LR and dextrose bolus started.  Febrile to 101 Fahrenheit.  White count 17.5 thousand and slightly better.  Making urine but +10.8 L since admit but Ur OP. Down.  Worsening kidney injury creatinine 3.6 with potassium 5.3.   CRRT  was stopped 7/7 .  No overt bleeding hemoglobin down 1.8 g% to 6.7 g%.   -> s.p  1 unit PRBC  CTABD - no RP bleed. LP later in day -norm CRRT resumed LP -opening pressure 38 cmH2O, cervical pressure 32 cm, clear colorless CSF, 91 mg percent glucose 0 RBC, 1 WBC, protein 30 mg/dL Ceribell spot EEG - generalized slowing per Dr Hortense Ramal 7/10  On CRRT, MV + fent versed dexmed, NE. White  count improved to 13k 7/11 plt dropping further. On NE, CRRT MV   Interim History / Subjective:   Remains intubated, sedated on pressors and on CRRT   Versed weaned from 6 to 2 then turned off  Fent at 400 and Dexmed at 0.6  NE at 10   Objective   Blood pressure (!) 120/47, pulse 92, temperature 97.88 F (36.6 C), resp. rate 20, height '5\' 11"'$  (1.803 m), weight 98.4 kg, SpO2 98 %.    Vent Mode:  PRVC FiO2 (%):  [40 %-50 %] 40 % Set Rate:  [20 bmp] 20 bmp Vt Set:  [550 mL] 550 mL PEEP:  [8 cmH20] 8 cmH20 Plateau Pressure:  [18 cmH20-26 cmH20] 23 cmH20   Intake/Output Summary (Last 24 hours) at 03/15/2021 0708 Last data filed at 03/15/2021 0700 Gross per 24 hour  Intake 4083.49 ml  Output 3971 ml  Net 112.49 ml   Filed Weights   03/13/21 0600 03/14/21 0500 03/15/21 0431  Weight: 96.7 kg 97.5 kg 98.4 kg       General: Chronically and critically ill appearing adult M, appears older than stated age. Intubated sedated NAD  HEENT: NCAT ETT secure pink mm. Trachea midline  Resp: Coarse resp sounds with upper lobe rhonchi. Symmetrical chest expansion, mechanically ventilated  CV:  rrr s1s2 no rgm cap refill < 3 sec GI:  soft, round, nd, + bowel sounds  GU  defer  Extremities: No acute joint deformity no cyanosis or clubbing. + edema  Skin:  pale, c/d/w.  Neurologic: Sedated, did not follow commands. Pupils 59m .   Resolved Hospital Problem list     Assessment & Plan:   Acute metabolic encephalopathy  P -wean sedation as able -dc versed, cont fent gtt and dexmed.  -RASS goal -1 -adding enteral gabpentin, seroquel -cont enteral oxy, clonazepam  -delirium precautions  -qtc monitoring   Acute respiratory failure with hypoxia due to multifocal PNA; suspected PCP PNA with secondary MSSA  P -Wean Mv support as tolerated  -VAP, PAD, pulm hygiene -consider early trach  -abx/pna as below   Septic Shock due to PNA in immunocompromised host  -likely PCP PNA with secondary MSSA. Fungitell >500, presumed candidiasis  -possibly multifactorial shock state -- sedation related?  P -antimicrobials per ID: fluconazole, rocephin, primaquine, clinda  -wean NE as able   HIV/AIDS  P -ID following  -tivicay, descovy   Acute renal failure on CKD 3b with ATN, requiring CRRT  P -CRRT per nephro   Anemia of chronic disease and critical illness - likely iatrogenic blood loss  component  Thrombocytopenia, likely critical illness related -decr from plt 218 (7/9, also had + schistos, Ddimer 16, fibrinogen >800 and INR 1.1) to plt 133 (7/11)  -doubt HIT-- plt have fluctuated throughout hx course before timing would have been greatly suspicious for HIT -DIC possible with critical illness processes, but labs a few days ago not c/w this -MAHA/ TTP can't be excluded -- renal failure, anemia, thrombocytopenia, + schistos a few days ago. That said, plt count is not profoundly low.  P -Daily CBC for now -transfuse as needed -will check a dic panel, LDH, reticulocytes, bili  -- depending on these will consider [id number] coombs  Chronic pain Anxiety Substance abuse disorder  P -adding gabapentin 7/11 (home '600mg'$  TID) at a reduced dose -cont SMarengoclonaz (home med 0.5 TID)   Hx HTN  -holding home antihypertensives in setting of shock   Best Practice    Diet/type: tf per  nutrition  DVT prophylaxis: prophylactic heparin  -  Heparin for CRRT GI prophylaxis: PPI Lines:  HD catheter Foley:  Yes, and it is no longer needed and removal ordered  Code Status:  full code Last date of multidisciplinary goals of care discussion -family updated 7/10  (mom [phone])   [place]  Lab 03/08/21 1020 03/10/21 2230 03/11/21 1039 03/13/21 1203  PHART 7.336* 7.372 7.373 7.274*  PCO2ART 45.7 46.4 42.8 46.1  PO2ART 83.5 76.1* 66.7* 96.8  HCO3 23.7 26.3 24.3 20.7  O2SAT 93.2 93.4 90.7 96.3    CBC Recent Labs  Lab 03/13/21 2048 03/14/21 0514 03/15/21 0000  HGB 7.7* 7.5* 7.1*  HCT 26.9* 25.6* 24.2*  WBC 13.1* 13.1* 14.1*  PLT 204 180 133*    COAGULATION Recent Labs  Lab 03/13/21 0943  INR 1.1    CARDIAC  No results for input(s): TROPONINI in the last 168 hours. No results for input(s): PROBNP in the last 168 hours.   CHEMISTRY Recent Labs  Lab 03/11/21 0520 03/12/21 0501 03/13/21 0405 03/13/21 1519 03/13/21 2359 03/14/21 0030  03/14/21 0055 03/14/21 0514 03/14/21 1625 03/15/21 0453  NA 140 139 137   < > 135 135 136 135  135 136 136  K 3.8 4.6 5.3*   < > 5.0 5.1 4.6 4.3  4.3 4.5 4.5  CL 106 105 102   < > 101 103 102 102  102 103 103  CO2 '28 24 23   '$ < > '22 22 22 25  25 26 26  '$ GLUCOSE 161* 154* 103*   < > 119* 119* 116* 182*  183* 183* 151*  BUN 62* 85* 98*   < > 88* 89* 79* 70*  71* 53* 41*  CREATININE 1.90* 2.56* 3.62*  3.51*   < > 3.79* 3.69* 3.15* 2.80*  2.76* 2.23* 1.86*  1.82*  CALCIUM 8.4* 8.3* 8.4*   < > 8.4* 8.3* 8.4* 8.0*  8.1* 7.8* 7.7*  MG 2.2 2.0 2.1  --   --   --   --  2.1  --  2.2  PHOS 4.8*  4.7* 5.8* 7.8*   < > 8.5*  --  8.0* 6.8* 5.2* 3.0   < > = values in this interval not displayed.   Estimated Creatinine Clearance: 60.6 mL/min (A) (by C-G formula based on SCr of 1.86 mg/dL (H)).   LIVER Recent Labs  Lab 03/10/21 2239 03/11/21 0520 03/12/21 0501 03/13/21 0943 03/13/21 1519 03/13/21 1730 03/13/21 2359 03/14/21 0055 03/14/21 0514 03/14/21 1625 03/15/21 0453  AST 23 23  --   --  26  --   --   --  24  --   --   ALT 17 17  --   --  6  --   --   --  5  --   --   ALKPHOS 65 69  --   --  91  --   --   --  91  --   --   BILITOT 0.5 0.4  --   --  0.6  --   --   --  0.6  --   --   PROT 6.7 6.3*  --   --  6.7  --   --   --  6.5  --   --   ALBUMIN 2.0* 1.9*  1.9*   < >  --  1.8*  1.8*   < > 1.8* 1.9* 1.8*  1.8* 1.8* 1.5*  INR  --   --   --  1.1  --   --   --   --   --   --   --    < > = values in this interval not displayed.     INFECTIOUS Recent Labs  Lab 03/13/21 0914 03/13/21 0915 03/14/21 0514 03/15/21 0453  LATICACIDVEN 1.2  --   --  1.0  PROCALCITON  --  3.86 4.80  --      ENDOCRINE CBG (last 3)  Recent Labs    03/14/21 1946 03/15/21 0002 03/15/21 0253  GLUCAP 153* 124* 169*    CT ABDOMEN PELVIS WO CONTRAST  Result Date: 03/13/2021 CLINICAL DATA:  Anemia, low hemoglobin, renal failure EXAM: CT ABDOMEN AND PELVIS WITHOUT CONTRAST TECHNIQUE:  Multidetector CT imaging of the abdomen and pelvis was performed following the standard protocol without IV contrast. COMPARISON:  02/12/2021 FINDINGS: Lower chest: Extensive heterogeneous and consolidative airspace opacity in the included bilateral lung bases with a small left pleural effusion. Coronary artery calcifications. Hepatobiliary: No solid liver abnormality is seen. Tiny gallstones in the dependent gallbladder. Gallbladder wall thickening, or biliary dilatation. Pancreas: Unremarkable. No pancreatic ductal dilatation or surrounding inflammatory changes. Spleen: Normal in size without significant abnormality. Adrenals/Urinary Tract: Adrenal glands are unremarkable. Kidneys are normal, without renal calculi, solid lesion, or hydronephrosis. Urinary bladder is decompressed by Foley catheter. Stomach/Bowel: Stomach is within normal limits. Appendix is not clearly visualized. No evidence of bowel wall thickening, distention, or inflammatory changes. The colon is fluid-filled to the rectum. Vascular/Lymphatic: Aortic atherosclerosis. No enlarged abdominal or pelvic lymph nodes. Reproductive: No mass or other significant abnormality. Other: Anasarca. There is intermediate attenuation (HU = 29) fluid in the right lower quadrant, which appears to be extraperitoneal, overlying the right psoas and external iliac artery (series 2, image 75). Musculoskeletal: No acute or significant osseous findings. IMPRESSION: 1. There is intermediate attenuation fluid in the right lower quadrant, which appears to be extraperitoneal, overlying the right psoas and external iliac artery. This is of uncertain etiology and may reflect a small hematoma, particularly if there has been recent vascular access in the right groin. The presence or absence of active bleeding is generally not established by noncontrast CT. 2. Extensive heterogeneous and consolidative airspace opacity in the included bilateral lung bases with a small left pleural  effusion, consistent with infection or aspiration. 3. Anasarca. 4. Cholelithiasis. 5. Coronary artery disease. Aortic Atherosclerosis (ICD10-I70.0). Electronically Signed   By: Eddie Candle M.D.   On: 03/13/2021 14:13   DG CHEST PORT 1 VIEW  Result Date: 03/14/2021 CLINICAL DATA:  Acute hypoxic respiratory failure with sepsis. History of HIV/aids, cystic kidney disease, asthma, diabetes. EXAM: PORTABLE CHEST 1 VIEW COMPARISON:  Chest x-rays dated 03/13/2021 and 03/11/2021. FINDINGS: Tubes and lines appear stable in position. Bilateral airspace opacities are not significantly changed, again most dense/confluent in the RIGHT upper lobe. Suspect small LEFT pleural effusion. No pneumothorax is seen. Heart size and mediastinal contours appear stable. IMPRESSION: No significant interval change. Bilateral airspace opacities, most dense/confluent in the RIGHT upper lobe, compatible with multifocal pneumonia. Probable small LEFT pleural effusion. Electronically Signed   By: Franki Cabot M.D.   On: 03/14/2021 08:32   DG CHEST PORT 1 VIEW  Result Date: 03/13/2021 CLINICAL DATA:  Respiratory failure.  No pneumonia EXAM: PORTABLE CHEST 1 VIEW COMPARISON:  03/11/2021 FINDINGS: Endotracheal tube terminates 4.0 cm above carina. Nasogastric tube extends beyond the inferior aspect of the film.  Right internal jugular line tip at mid to low SVC. Normal heart size. No pleural effusion or pneumothorax. Multifocal airspace disease, greater right than left. Relatively similar. IMPRESSION: No significant change in multifocal bilateral pneumonia. Electronically Signed   By: Abigail Miyamoto M.D.   On: 03/13/2021 11:35   DG FLUORO GUIDE LUMBAR PUNCTURE  Result Date: 03/13/2021 Enrique Sack, MD     03/13/2021  4:34 PM Fluroscopic guided Lumbar puncture performed at L3-4.  18 gauge spinal needle used.  OP 38 cm H20.  CP 32 cm H20.  14 cc clear, colorless CSF removed and sent to lab.  The patient tolerated the procedure well with no  immediate complications.     CRITICAL CARE Performed by: Cristal Generous   Total critical care time: 59 minutes  Critical care time was exclusive of separately billable procedures and treating other patients. Critical care was necessary to treat or prevent imminent or life-threatening deterioration.  Critical care was time spent personally by me on the following activities: development of treatment plan with patient and/or surrogate as well as nursing, discussions with consultants, evaluation of patient's response to treatment, examination of patient, obtaining history from patient or surrogate, ordering and performing treatments and interventions, ordering and review of laboratory studies, ordering and review of radiographic studies, pulse oximetry and re-evaluation of patient's condition.  Eliseo Gum MSN, AGACNP-BC [place] for pager 03/15/2021, 7:08 AM

## 2021-03-15 NOTE — Progress Notes (Signed)
Attending:    Subjective: Heavily sedated Didn't pass SBT due to sedation Remains mechanically ventilated Worsening thrombocytopenia  Objective: Vitals:   03/15/21 0845 03/15/21 0900 03/15/21 0915 03/15/21 0930  BP: (!) 121/46 (!) 127/54 (!) 116/50 (!) 113/47  Pulse: (!) 109 (!) 108 (!) 107 (!) 106  Resp: (!) 26 (!) 28 (!) 27 (!) 27  Temp: 98.96 F (37.2 C) 99.14 F (37.3 C) 99.5 F (37.5 C) 99.86 F (37.7 C)  TempSrc:      SpO2: 94% 94% 96% 95%  Weight:      Height:       Vent Mode: PRVC FiO2 (%):  [40 %-50 %] 40 % Set Rate:  [20 bmp] 20 bmp Vt Set:  [550 mL] 550 mL PEEP:  [8 cmH20] 8 cmH20 Plateau Pressure:  [18 cmH20-27 cmH20] 27 cmH20  Intake/Output Summary (Last 24 hours) at 03/15/2021 1007 Last data filed at 03/15/2021 1000 Gross per 24 hour  Intake 4041.84 ml  Output 3788 ml  Net 253.84 ml    General:  In bed on vent HENT: NCAT ETT in place PULM: CTA B, vent supported breathing CV: RRR, no mgr GI: BS+, soft, nontender MSK: normal bulk and tone Neuro: sedated on vent    CBC    Component Value Date/Time   WBC 14.1 (H) 03/15/2021 0000   RBC 2.48 (L) 03/15/2021 0000   HGB 7.1 (L) 03/15/2021 0000   HGB 8.9 (L) 02/13/2021 1107   HCT 24.2 (L) 03/15/2021 0000   PLT 133 (L) 03/15/2021 0000   MCV 97.6 03/15/2021 0000   MCH 28.6 03/15/2021 0000   MCHC 29.3 (L) 03/15/2021 0000   RDW 20.8 (H) 03/15/2021 0000   LYMPHSABS 2.2 03/05/2021 0130   MONOABS 2.3 (H) 03/05/2021 0130   EOSABS 0.1 03/05/2021 0130   BASOSABS 0.0 03/05/2021 0130    BMET    Component Value Date/Time   NA 136 03/15/2021 0453   K 4.5 03/15/2021 0453   CL 103 03/15/2021 0453   CO2 26 03/15/2021 0453   GLUCOSE 151 (H) 03/15/2021 0453   BUN 41 (H) 03/15/2021 0453   CREATININE 1.86 (H) 03/15/2021 0453   CREATININE 1.82 (H) 03/15/2021 0453   CALCIUM 7.7 (L) 03/15/2021 0453   GFRNONAA 45 (L) 03/15/2021 0453   GFRNONAA 46 (L) 03/15/2021 0453   GFRAA >60 05/29/2011 2212    CXR  images personally reviewed> RUL infiltrate, bilateral interstitial opacification, ett in place, R IJ HD cath in place  Impression/Plan: Need for sedation for mechanical ventilation; oversedated > stop versed, add orals, continue fentanyl, precedex per PAD protocol Thrombocytopenia > TTP? DIC? 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

## 2021-03-15 NOTE — Progress Notes (Signed)
CCM Communication note  Attempted to reach pt mother to provide clinical updates and discuss consent for bronch / BAL.  Unable to reach. Will continue to try as able    Eliseo Gum MSN, AGACNP-BC [place] 03/15/2021, 2:59 PM

## 2021-03-15 NOTE — Progress Notes (Signed)
[facility] for Infectious Disease    Date of Admission:  03/04/2021   Total days of antibiotics 12          ID: Bruce Hunter is a 45 y.o. 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 Sedation decreased and pressor requirements slightly less today. On FiO2 of 40%  Medications:   sodium chloride   Intravenous Once   alteplase  2 mg Intracatheter Once   alteplase  2 mg Intracatheter Once   [START ON 03/16/2021] ceFAZolin (ANCEF) IVPB 2 gram/100 mL NS (Mini-Bag Plus)  2 g Intravenous Q12H   chlorhexidine gluconate (MEDLINE KIT)  15 mL Mouth Rinse BID   Chlorhexidine Gluconate Cloth  6 each Topical Daily   clonazePAM  1 mg Per Tube BID   darbepoetin (ARANESP) injection - NON-DIALYSIS  40 mcg Subcutaneous Q Sun-1800   dolutegravir  50 mg Per Tube Daily   emtricitabine-tenofovir AF  1 tablet Per Tube Daily   feeding supplement (PIVOT 1.5 CAL)  1,000 mL Per Tube Q24H   gabapentin  100 mg Per Tube Q12H   insulin aspart  0-15 Units Subcutaneous Q4H   ipratropium-albuterol  3 mL Nebulization Q4H   mouth rinse  15 mL Mouth Rinse 10 times per day   methylPREDNISolone (SOLU-MEDROL) injection  30 mg Intravenous Q12H   oxyCODONE  5 mg Per Tube Q6H   pantoprazole sodium  40 mg Per Tube QHS   primaquine  30 mg Oral Daily   QUEtiapine  25 mg Per Tube BID   sodium chloride flush  10-40 mL Intracatheter Q12H    Objective: Vital signs in last 24 hours: Temp:  [97.16 F (36.2 C)-100.94 F (38.3 C)] 99.2 F (37.3 C) (07/11 1600) Pulse Rate:  [90-110] 107 (07/11  1630) Resp:  [16-29] 19 (07/11 1630) BP: (97-135)/(37-88) 120/56 (07/11 1630) SpO2:  [90 %-100 %] 97 % (07/11 1630) FiO2 (%):  [40 %] 40 % (07/11 1544) Weight:  [98.4 kg] 98.4 kg (07/11 0431) Physical Exam  Constitutional: He is sedated He appears well-developed and well-nourished. No distress.  HENT:  Mouth/Throat: OETT in place.   Cardiovascular: Normal rate, regular rhythm and normal heart sounds. Exam reveals no gallop and no friction rub.  No murmur heard.  Pulmonary/Chest: Effort normal and breath sounds normal. No respiratory distress. He has no wheezes.  Abdominal: Soft. Bowel sounds are decreased. He exhibits no distension. There is no tenderness.  GEZ:MOQHUTM edema Skin: Skin is warm and dry. No rash noted. No erythema.     Lab Results Recent Labs    03/14/21 0514 03/14/21 1625 03/15/21 0000 03/15/21 0453  WBC 13.1*  --  14.1*  --   HGB 7.5*  --  7.1*  --   HCT 25.6*  --  24.2*  --   NA 135  135 136  --  136  K 4.3  4.3 4.5  --  4.5  CL 102  102 103  --  103  CO2 _0 --  26  BUN 70*  71* 53*  --  41*  CREATININE 2.80*  2.76* 2.23*  --  1.86*  1.82*   Liver Panel Recent Labs    03/14/21 0514 03/14/21 1625 03/15/21 0453 03/15/21 1116  PROT 6.5  --   --  6.7  ALBUMIN 1.8*  1.8*   < > 1.5* 1.7*  AST 24  --   --  30  ALT 5  --   --  7  ALKPHOS 91  --   --  144*  BILITOT 0.6  --   --  0.4  BILIDIR  --   --   --  0.1  IBILI  --   --   --  0.3   < > = values in this interval not displayed.   Sedimentation Rate No results for input(s): ESRSEDRATE in the last 72 hours. C-Reactive Protein No results for input(s): CRP in the last 72 hours.  Microbiology: reviewed Studies/Results: DG CHEST PORT 1 VIEW  Result Date: 03/15/2021 CLINICAL DATA:  HIV, multifocal pneumonia. EXAM: PORTABLE CHEST 1 VIEW COMPARISON:  03/14/2021. FINDINGS: Endotracheal tube terminates 4.6 cm above the carina. Nasogastric tube is followed into the stomach with the tip  projecting beyond the inferior margin of the image. Right IJ central line is in the SVC. Heart is at the upper limits of normal in size to mildly enlarged, stable. Patchy mixed interstitial and airspace opacification bilaterally, worst in the right upper lobe. Left lower lobe aeration has improved somewhat in the interval. No definite pleural fluid. IMPRESSION: Multi lobar pneumonia with improving aeration in the left lower lobe. Electronically Signed   By: Lorin Picket M.D.   On: 03/15/2021 08:05   DG CHEST PORT 1 VIEW  Result Date: 03/14/2021 CLINICAL DATA:  Acute hypoxic respiratory failure with sepsis. History of HIV/aids, cystic kidney disease, asthma, diabetes. EXAM: PORTABLE CHEST 1 VIEW COMPARISON:  Chest x-rays dated 03/13/2021 and 03/11/2021. FINDINGS: Tubes and lines appear stable in position. Bilateral airspace opacities are not significantly changed, again most dense/confluent in the RIGHT upper lobe. Suspect small LEFT pleural effusion. No pneumothorax is seen. Heart size and mediastinal contours appear stable. IMPRESSION: No significant interval change. Bilateral airspace opacities, most dense/confluent in the RIGHT upper lobe, compatible with multifocal pneumonia. Probable small LEFT pleural effusion. Electronically Signed   By: Franki Cabot M.D.   On: 03/14/2021 08:32     Assessment/Plan: Advanced hiv disease= newly started on ART.continue on tivicay/descovy Will check HIV VL to if trending downward since initiation within < 4 wk.  Multifocal pneumonia with hypoxic resp failure s/p intubation,on vent = probably PJP since fungitell>500, elevated LDH. Continue on clinda, primaquine, and steroids. Agreed with plan for tracheostomy  New onset fevers = CMV ruled out. Will check ferritin to see if could be considered as part of [place]. Will also check AFB blood cx for disseminated MAC. Abd/pelvis imaging did not suggest LN enlargement  AKI on CRRT = continue on renal replacement  therapy  [facility] for Infectious Diseases Cell: [phone] Pager: [phone]  03/15/2021, 5:11 PM

## 2021-03-15 NOTE — Progress Notes (Signed)
Pt weaned for 4+ hours on PS 8/Peep 8, 40%.  Tolerated well throughout.

## 2021-03-16 DIAGNOSIS — J9601 Acute respiratory failure with hypoxia: Secondary | ICD-10-CM | POA: Diagnosis not present

## 2021-03-16 DIAGNOSIS — A419 Sepsis, unspecified organism: Secondary | ICD-10-CM

## 2021-03-16 DIAGNOSIS — G9341 Metabolic encephalopathy: Secondary | ICD-10-CM | POA: Diagnosis not present

## 2021-03-16 DIAGNOSIS — R6520 Severe sepsis without septic shock: Secondary | ICD-10-CM

## 2021-03-16 DIAGNOSIS — N179 Acute kidney failure, unspecified: Secondary | ICD-10-CM | POA: Diagnosis not present

## 2021-03-16 DIAGNOSIS — N1832 Chronic kidney disease, stage 3b: Secondary | ICD-10-CM

## 2021-03-16 DIAGNOSIS — B20 Human immunodeficiency virus [HIV] disease: Secondary | ICD-10-CM | POA: Diagnosis not present

## 2021-03-16 LAB — APTT: aPTT: 126 seconds — ABNORMAL HIGH (ref 24–36)

## 2021-03-16 LAB — POCT ACTIVATED CLOTTING TIME
Activated Clotting Time: 219 seconds
Activated Clotting Time: 213 seconds
Activated Clotting Time: 207 seconds
Activated Clotting Time: 208 seconds
Activated Clotting Time: 196 seconds

## 2021-03-16 LAB — GLUCOSE, CAPILLARY
Glucose-Capillary: 202 mg/dL — ABNORMAL HIGH (ref 70–99)
Glucose-Capillary: 217 mg/dL — ABNORMAL HIGH (ref 70–99)
Glucose-Capillary: 187 mg/dL — ABNORMAL HIGH (ref 70–99)
Glucose-Capillary: 209 mg/dL — ABNORMAL HIGH (ref 70–99)
Glucose-Capillary: 248 mg/dL — ABNORMAL HIGH (ref 70–99)
Glucose-Capillary: 221 mg/dL — ABNORMAL HIGH (ref 70–99)

## 2021-03-16 LAB — CBC
HCT: 22.5 % — ABNORMAL LOW (ref 39.0–52.0)
Hemoglobin: 6.5 g/dL — CL (ref 13.0–17.0)
MCH: 28.4 pg (ref 26.0–34.0)
MCHC: 28.9 g/dL — ABNORMAL LOW (ref 30.0–36.0)
MCV: 98.3 fL (ref 80.0–100.0)
Platelets: 157 10*3/uL (ref 150–400)
RBC: 2.29 MIL/uL — ABNORMAL LOW (ref 4.22–5.81)
RDW: 21 % — ABNORMAL HIGH (ref 11.5–15.5)
WBC: 11 10*3/uL — ABNORMAL HIGH (ref 4.0–10.5)
nRBC: 0.4 % — ABNORMAL HIGH (ref 0.0–0.2)

## 2021-03-16 LAB — RENAL FUNCTION PANEL
Albumin: 1.7 g/dL — ABNORMAL LOW (ref 3.5–5.0)
Albumin: 1.7 g/dL — ABNORMAL LOW (ref 3.5–5.0)
Anion gap: 7 (ref 5–15)
Anion gap: 9 (ref 5–15)
BUN: 39 mg/dL — ABNORMAL HIGH (ref 6–20)
BUN: 37 mg/dL — ABNORMAL HIGH (ref 6–20)
CO2: 27 mmol/L (ref 22–32)
CO2: 27 mmol/L (ref 22–32)
Calcium: 8.4 mg/dL — ABNORMAL LOW (ref 8.9–10.3)
Calcium: 8.5 mg/dL — ABNORMAL LOW (ref 8.9–10.3)
Chloride: 104 mmol/L (ref 98–111)
Chloride: 102 mmol/L (ref 98–111)
Creatinine, Ser: 1.65 mg/dL — ABNORMAL HIGH (ref 0.61–1.24)
Creatinine, Ser: 1.7 mg/dL — ABNORMAL HIGH (ref 0.61–1.24)
GFR, Estimated: 52 mL/min — ABNORMAL LOW (ref >60)
GFR, Estimated: 50 mL/min — ABNORMAL LOW (ref >60)
Glucose, Bld: 196 mg/dL — ABNORMAL HIGH (ref 70–99)
Glucose, Bld: 238 mg/dL — ABNORMAL HIGH (ref 70–99)
Phosphorus: 2.3 mg/dL — ABNORMAL LOW (ref 2.5–4.6)
Phosphorus: 2.3 mg/dL — ABNORMAL LOW (ref 2.5–4.6)
Potassium: 5.1 mmol/L (ref 3.5–5.1)
Potassium: 4.8 mmol/L (ref 3.5–5.1)
Sodium: 138 mmol/L (ref 135–145)
Sodium: 138 mmol/L (ref 135–145)

## 2021-03-16 LAB — HEMOGLOBIN AND HEMATOCRIT, BLOOD
HCT: 24.8 % — ABNORMAL LOW (ref 39.0–52.0)
Hemoglobin: 7.3 g/dL — ABNORMAL LOW (ref 13.0–17.0)

## 2021-03-16 LAB — VARICELLA ZOSTER ANTIBODY, IGM: Varicella-Zoster Ab, IgM: <0.91 index (ref 0.00–0.90)

## 2021-03-16 LAB — CMV DNA, QUANTITATIVE, PCR
CMV DNA Quant: NEGATIVE IU/mL
Log10 CMV Qn DNA Pl: UNDETERMINED log10 IU/mL

## 2021-03-16 LAB — HSV 1/2 PCR, CSF
HSV-1 DNA: NEGATIVE
HSV-2 DNA: NEGATIVE

## 2021-03-16 LAB — CULTURE, RESPIRATORY W GRAM STAIN: Culture: NO GROWTH

## 2021-03-16 LAB — PREPARE RBC (CROSSMATCH)

## 2021-03-16 LAB — PNEUMOCYSTIS JIROVECI SMEAR BY DFA: Pneumocystis jiroveci Ag: NEGATIVE

## 2021-03-16 LAB — HISTOPLASMA ANTIGEN, URINE: Histoplasma Antigen, urine: <0.5 (ref <0.5)

## 2021-03-16 LAB — MAGNESIUM: Magnesium: 2.4 mg/dL (ref 1.7–2.4)

## 2021-03-16 MED ORDER — METOPROLOL TARTRATE 5 MG/5ML IV SOLN
2.5000 mg | INTRAVENOUS | Status: DC | PRN
  Administered 2021-03-19 (×2): 2.5 mg via INTRAVENOUS
  Filled 2021-03-16: qty 5

## 2021-03-16 MED ORDER — SODIUM CHLORIDE 0.9% IV SOLUTION: Freq: Once | INTRAVENOUS | Status: AC

## 2021-03-16 MED ORDER — MIDAZOLAM HCL 2 MG/2ML IJ SOLN
5.0000 mg | Freq: Once | INTRAMUSCULAR | Status: AC
  Administered 2021-03-17: 4 mg via INTRAVENOUS
  Filled 2021-03-16: qty 6

## 2021-03-16 MED ORDER — PROSOURCE TF PO LIQD
90.0000 mL | Freq: Four times a day (QID) | ORAL | Status: AC
  Administered 2021-03-16 – 2021-03-19 (×14): 90 mL
  Filled 2021-03-16 (×14): qty 90

## 2021-03-16 MED ORDER — PIVOT 1.5 CAL PO LIQD
1000.0000 mL | ORAL | Status: DC
  Administered 2021-03-18 (×2): 1000 mL
  Filled 2021-03-16 (×2): qty 1000

## 2021-03-16 MED ORDER — ETOMIDATE 2 MG/ML IV SOLN
40.0000 mg | Freq: Once | INTRAVENOUS | Status: AC
  Administered 2021-03-17: 20 mg via INTRAVENOUS
  Filled 2021-03-16: qty 20

## 2021-03-16 MED ORDER — PROPOFOL 10 MG/ML IV BOLUS: 500.0000 mg | Freq: Once | INTRAVENOUS | Status: DC

## 2021-03-16 MED ORDER — VECURONIUM BROMIDE 10 MG IV SOLR
10.0000 mg | Freq: Once | INTRAVENOUS | Status: AC
  Administered 2021-03-17: 10 mg via INTRAVENOUS
  Filled 2021-03-16: qty 10

## 2021-03-16 MED ORDER — FENTANYL BOLUS VIA INFUSION
25.0000 ug | INTRAVENOUS | Status: DC | PRN
  Administered 2021-03-16: 100 ug via INTRAVENOUS
  Administered 2021-03-16: 50 ug via INTRAVENOUS
  Administered 2021-03-16: 100 ug via INTRAVENOUS
  Administered 2021-03-16: 50 ug via INTRAVENOUS
  Administered 2021-03-16 – 2021-03-18 (×5): 100 ug via INTRAVENOUS
  Filled 2021-03-16: qty 100

## 2021-03-16 MED ORDER — MIDAZOLAM HCL 2 MG/2ML IJ SOLN
1.0000 mg | INTRAMUSCULAR | Status: DC | PRN
  Administered 2021-03-16: 1 mg via INTRAVENOUS
  Administered 2021-03-16 (×2): 2 mg via INTRAVENOUS
  Administered 2021-03-16: 1 mg via INTRAVENOUS
  Administered 2021-03-17: 2 mg via INTRAVENOUS
  Administered 2021-03-19: 1 mg via INTRAVENOUS
  Administered 2021-03-19 – 2021-03-24 (×5): 2 mg via INTRAVENOUS
  Filled 2021-03-16 (×9): qty 2

## 2021-03-16 MED ORDER — FENTANYL CITRATE (PF) 100 MCG/2ML IJ SOLN
200.0000 ug | Freq: Once | INTRAMUSCULAR | Status: AC
  Administered 2021-03-17: 200 ug via INTRAVENOUS

## 2021-03-16 NOTE — Progress Notes (Signed)
Nephrology Follow-Up Consult note   Assessment/Recommendations: Bruce Hunter is a/an 45 y.o. male with a past medical history significant for HIV/AIDS, CKD, asthma, obesity, DM2 admitted for acute hypoxic respiratory failure with sepsis.      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  CKD with baseline of 2.5-3 (thus expected to rise at least to same).  Did have some urinary retention so Foley catheter placed. CRRT started on 7/02, held on 7/7 and 7/8 and resumed on 7/09. Continue CRRT.  UF goal kepp + 50 cc/hr.  BP/volume - is up 14 L by I/O and 10kg by wts. Not on pressors, will ^ UF goal w/ CRRT.   Multifocal PNA - ID suspects PCP + secondary MSSA infection. Sp bronch today. May need trach. CXR's most cleared w/ some residual RUL infiltrates  Septic shock: Antibiotics per primary team, off pressors today  Pneumonia: Concern for PJP.  Antibiotics and management per primary  HIV: ID managing  Hypertension: Holding home blood pressure medications given hx of septic shock  DM2: Insulin management per primary  Anemia macrocytic. Likely multifactorial with infection contributing. Transfuse PRBC's prn. Started darbe 40 ug per wk.   Kelly Splinter, MD 03/16/2021, 6:01 PM      Interval History/Subjective:  on levo gtt 9 ug/min, 200 cc UOP yest  Review of systems: Unable to obtain 2/2 AMS   Medications:  Current Facility-Administered Medications  Medication Dose Route Frequency Provider Last Rate Last Admin    prismasol BGK 4/2.5 infusion   CRRT Continuous Roney Jaffe, MD 400 mL/hr at 03/16/21 1323 New Bag at 03/16/21 1323    prismasol BGK 4/2.5 infusion   CRRT Continuous Roney Jaffe, MD 200 mL/hr at 03/15/21 2212 New Bag at 03/15/21 2212   0.9 %  sodium chloride infusion (Manually program via Guardrails IV Fluids)   Intravenous Once Brand Males, MD       0.9 %  sodium chloride infusion  250 mL Intravenous Continuous Candee Furbish, MD 10 mL/hr at 03/10/21 0111 Infusion Verify at 03/10/21 0111   0.9 %  sodium chloride infusion  250 mL Intravenous Continuous Anders Simmonds, MD   Stopped at 03/16/21 1203   acetaminophen (TYLENOL) 160 MG/5ML solution 650 mg  650 mg Per Tube Q6H PRN Tanda Rockers, MD   650 mg at 03/13/21 1656   albuterol (PROVENTIL) (2.5 MG/3ML) 0.083% nebulizer solution 2.5 mg  2.5 mg Nebulization Q2H PRN Collier Bullock, MD       alteplase (CATHFLO ACTIVASE) injection 2 mg  2 mg Intracatheter Once Tanda Rockers, MD       alteplase (CATHFLO ACTIVASE) injection 2 mg  2 mg Intracatheter Once Roney Jaffe, MD       ceFAZolin (ANCEF) 2 g in sodium chloride 0.9 % 100 mL IVPB  2 g Intravenous Q12H Wofford, Drew A, RPH   2 g at 03/16/21 1035   chlorhexidine gluconate (MEDLINE KIT) (PERIDEX) 0.12 % solution 15 mL  15 mL Mouth Rinse BID Candee Furbish, MD   15 mL at 03/16/21 0762   Chlorhexidine Gluconate Cloth 2 % PADS 6 each  6 each Topical Daily Brand Males, MD   6 each at 03/16/21 0630   clindamycin (CLEOCIN) IVPB 900 mg  900 mg Intravenous Q8H Carlyle Basques, MD   Stopped at 03/16/21 1431   clonazePAM (KLONOPIN) tablet 1 mg  1 mg Per Tube BID Hunsucker, Bonna Gains, MD   1 mg at  03/16/21 1038   Darbepoetin Alfa (ARANESP) injection 40 mcg  40 mcg Subcutaneous Q Sun-1800 Claudia Desanctis, MD   40 mcg at 03/14/21 1817   dexmedetomidine (PRECEDEX) 200 MCG/50ML (4 mcg/mL) infusion  0.4-1.2 mcg/kg/hr Intravenous Titrated Frederik Pear, MD 21.4 mL/hr at 03/16/21 1705 0.9 mcg/kg/hr at 03/16/21 1705   docusate (COLACE) 50 MG/5ML liquid 100 mg  100 mg Per Tube BID PRN Tanda Rockers, MD   100 mg at 03/08/21 1358   dolutegravir (TIVICAY) tablet 50 mg  50 mg Per Tube Daily Tanda Rockers, MD   50 mg at 03/16/21 1038   emtricitabine-tenofovir AF (DESCOVY) 200-25 MG per tablet 1 tablet  1 tablet Per Tube Daily Carlyle Basques, MD   1 tablet at 03/16/21 1038   [START ON 03/17/2021] etomidate (AMIDATE) injection  40 mg  40 mg Intravenous Once Cristal Generous, NP       [START ON 03/17/2021] feeding supplement (PIVOT 1.5 CAL) liquid 1,000 mL  1,000 mL Per Tube Q24H Simonne Maffucci B, MD       feeding supplement (PROSource TF) liquid 90 mL  90 mL Per Tube QID Simonne Maffucci B, MD       fentaNYL (SUBLIMAZE) bolus via infusion 25-100 mcg  25-100 mcg Intravenous Q1H PRN Cristal Generous, NP   50 mcg at 03/16/21 1740   [START ON 03/17/2021] fentaNYL (SUBLIMAZE) injection 200 mcg  200 mcg Intravenous Once Bowser, Laurel Dimmer, NP       fentaNYL 2535mg in NS 2545m(1069mml) infusion-PREMIX  0-400 mcg/hr Intravenous Continuous IzqTyna JakschD 12.5 mL/hr at 03/16/21 1700 125 mcg/hr at 03/16/21 1700   fluconazole (DIFLUCAN) IVPB 400 mg  400 mg Intravenous Q24H WofPolly CobiaPH   Stopped at 03/16/21 0004   gabapentin (NEURONTIN) 250 MG/5ML solution 100 mg  100 mg Per Tube Q12H Bowser, GraLaurel DimmerP   100 mg at 03/16/21 1038   heparin 10,000 units/ 20 mL infusion syringe  250-3,000 Units/hr CRRT Continuous SinGean QuintD 3.9 mL/hr at 03/16/21 1319 1,950 Units/hr at 03/16/21 1319   heparin bolus via infusion syringe 1,000 Units  1,000 Units CRRT PRN SinGean QuintD   1,000 Units at 03/13/21 2328   heparin injection 1,000-6,000 Units  1,000-6,000 Units CRRT PRN FosClaudia DesanctisD       insulin aspart (novoLOG) injection 0-15 Units  0-15 Units Subcutaneous Q4H Ollis, Brandi L, NP   5 Units at 03/16/21 1636   ipratropium-albuterol (DUONEB) 0.5-2.5 (3) MG/3ML nebulizer solution 3 mL  3 mL Nebulization Q4H IzqTyna JakschD   3 mL at 03/16/21 1553   MEDLINE mouth rinse  15 mL Mouth Rinse 10 times per day SmiCandee FurbishD   15 mL at 03/16/21 1636   methylPREDNISolone sodium succinate (SOLU-MEDROL) 40 mg/mL injection 30 mg  30 mg Intravenous Q12H SniCarlyle BasquesD   30 mg at 03/16/21 1705   metoprolol tartrate (LOPRESSOR) injection 2.5-5 mg  2.5-5 mg Intravenous Q3H PRN BowCristal GenerousP       midazolam  (VERSED) injection 1-2 mg  1-2 mg Intravenous Q2H PRN BowCristal GenerousP   1 mg at 03/16/21 0933   [START ON 03/17/2021] midazolam (VERSED) injection 5 mg  5 mg Intravenous Once BowCristal GenerousP       norepinephrine (LEVOPHED) 16 mg in 250m80memix infusion  0-40 mcg/min Intravenous Titrated GreeMinda DittoH   Stopped at 03/15/21 1939  ondansetron (ZOFRAN) injection 4 mg  4 mg Intravenous Q6H PRN Collier Bullock, MD       oxyCODONE (ROXICODONE) 5 MG/5ML solution 5 mg  5 mg Per Tube Q6H Corey Harold, NP   5 mg at 03/16/21 1631   pantoprazole sodium (PROTONIX) 40 mg/20 mL oral suspension 40 mg  40 mg Per Tube QHS Leodis Sias T, RPH   40 mg at 03/15/21 2207   polyethylene glycol (MIRALAX / GLYCOLAX) packet 17 g  17 g Per Tube Daily PRN Tanda Rockers, MD       primaquine tablet 30 mg  30 mg Oral Daily Carlyle Basques, MD   30 mg at 03/16/21 1039   prismasol BGK 4/2.5 infusion   CRRT Continuous Roney Jaffe, MD 1,800 mL/hr at 03/16/21 1629 New Bag at 03/16/21 1629   propofol (DIPRIVAN) 10 mg/mL bolus/IV push 500 mg  500 mg Intravenous Once Cristal Generous, NP       QUEtiapine (SEROQUEL) tablet 25 mg  25 mg Per Tube BID Cristal Generous, NP   25 mg at 03/16/21 1039   sodium chloride flush (NS) 0.9 % injection 10-40 mL  10-40 mL Intracatheter Q12H Tanda Rockers, MD   10 mL at 03/16/21 1048   sodium chloride flush (NS) 0.9 % injection 10-40 mL  10-40 mL Intracatheter PRN Tanda Rockers, MD       [START ON 03/17/2021] vecuronium (NORCURON) injection 10 mg  10 mg Intravenous Once Cristal Generous, NP         Physical Exam: Vitals:   03/16/21 1630 03/16/21 1700  BP: 133/70 136/76  Pulse: 94 94  Resp: (!) 30 (!) 32  Temp:    SpO2: 94% 96%   Total I/O In: 1612.9 [I.V.:468.7; Blood:397.5; NG/GT:598.5; IV Piggyback:148.1] Out: 1240 [Other:740; Stool:500]  Intake/Output Summary (Last 24 hours) at 03/16/2021 1801 Last data filed at 03/16/2021 1700 Gross per 24 hour  Intake 3486.45 ml   Output 2442 ml  Net 1044.45 ml    General adult male in bed intubated   HEENT normocephalic atraumatic  Neck supple trachea midline Lungs coarse mechanical breath sounds FIO2 50 and PEEP 8 Heart S1S2 no rub Abdomen soft nontender with limits of sedation distended Extremities no edema lower extremities; upper extremities 1+ edema  Neuro sedation running Access: RIJ nontunneled catheter in place   Test Results I personally reviewed new and old clinical labs and radiology tests Lab Results  Component Value Date   NA 138 03/16/2021   K 4.8 03/16/2021   CL 102 03/16/2021   CO2 27 03/16/2021   BUN 37 (H) 03/16/2021   CREATININE 1.70 (H) 03/16/2021   CALCIUM 8.5 (L) 03/16/2021   ALBUMIN 1.7 (L) 03/16/2021   PHOS 2.3 (L) 03/16/2021     [name], MD 03/16/2021 6:01 PM

## 2021-03-16 NOTE — Procedures (Signed)
Diagnostic Bronchoscopy Procedure Note   Bruce Hunter  [id number]  09-30-1975  Date:03/16/21  Time:9:45 AM   Provider Performing:Akire Rennert E Caylin Raby  Procedure: Diagnostic Bronchoscopy (81856) with BAL   Indication(s) Diagnostic: BAL for diagnostic culture in setting of acute hypoxic respiratory failure, multifocal pneumonia   Consent Risks of the procedure as well as the alternatives and risks of each were explained to the patient and/or caregiver.  Consent for the procedure was obtained from the patient's mother via phone.   Anesthesia Fentanyl infusion, Precedex infusion.  42m Versed IV  2073m fentanyl IV bolus    Time Out Verified patient identification, verified procedure, site/side was marked, verified correct patient position, special equipment/implants available, medications/allergies/relevant history reviewed, required imaging and test results available.   Sterile Technique Usual hand hygiene, masks, gowns, and gloves were used   Procedure Description Bronchoscope advanced through endotracheal tube and into airway.  ETT lined with light-medium brown dried secretions. Anatomical landmarks identified. Tracheal secretions suctioned- small volume thick tan mucoid secretions. Bronchoscope advanced into RML. Small volume of saline instilled, BAL performed and moderate pink tinged, thick sample acquired in sputum trap. Trap removed, sealed and labeled for lab evaluation.  Right and left lobes then explored. Minimal secretions elsewhere in R lobes. Saline instilled and aspirated yielding mostly clear saline aspirate with occasional pink-tinged mucus into suction cannister. On the left side, minimal secretions LUL. Bronchoscope advanced to LLL and a large sample of thick tan and pink tinged mucoid secretions were suctioned into canister. Sputum trap applied, saline instilled and aspirated. Small volume of firm appearing tan and pink ball-like secretion aspirated along with saline.  Trap removed, sealed and labeled for lab evaluation.  Bronchoscope then retraced while inspecting airways, and removed.   Complications/Tolerance None; patient tolerated the procedure well..  EBL None   Specimen(s) RML, LLL sent for  for respiratory culture, PCP, fungal culture    GrEliseo GumSN, [name] pager  03/16/2021, 10:01 AM

## 2021-03-16 NOTE — Progress Notes (Signed)
Nutrition Follow-up  DOCUMENTATION CODES:   Not applicable  INTERVENTION:  - will adjust TF regimen: Pivot 1.5 @ 55 ml/hr with 90 ml Prosource TF QID. - this regimen will provide 2300 kcal (104% kcal need), 212 grams protein, and 990 ml free water. - free water flush, if desired, to be per CCM.    NUTRITION DIAGNOSIS:   Inadequate oral intake related to inability to eat as evidenced by NPO status. -ongoing  GOAL:   Patient will meet greater than or equal to 90% of their needs -to be met with TF regimen  MONITOR:   Vent status, TF tolerance, Labs, Weight trends, Skin   ASSESSMENT:   45 year-old male with medical history of recently diagnosed HIV/AIDS and non-compliant with HAART, DM, asthma, HLD, HTN, gout, and obesity. He presented to the ED d/t shortness of breath, peripheral edema, and productive (tan sputum) cough.  Significant Events: 6/30- admission 7/1- intubated; OGT placed 7/2- HD cath placed in R IJ; CRRT initiation 7/3- TF initiation per protocol 7/4- initial RD assessment; TF switched to Pivot 1.5 7/6- CRRT stopped 7/9- CRRT resumed   Estimated needs adjusted d/t CRRT re-start. Patient remains intubated with OGT in place. Order in place for Pivot 1.5 @ 60 ml/hr to provide 2160 kcal, 135 grams protein, and 1080 ml free water.  TF was off for multiple hours yesterday AM and was off x3.5 hours this AM d/t medication administration instructions. On both dates, RN calculated adjustment to TF rate using PEPUP calculator.   Patient is currently receiving Pivot 1.5 @ 77 ml/hr for this reason.   RN reports that patient had large volume BMs x3 this AM and that flexiseal is now in place.   Weight today is the highest weight this admission. Mild pitting edema to RUE and BLE and moderate pitting edema to LUE documented in the edema section of flow sheet. Degree of pitting has increased from yesterday.   Per notes: - s/p bronch this AM - AKI on  on stage 3  CKD-->CRRT - plan for tracheostomy on 7/13 - severe sepsis 2/2 multifocal PNA   Patient is currently intubated on ventilator support MV: 12.9 L/min Temp (24hrs), Avg:98.2 F (36.8 C), Min:97.7 F (36.5 C), Max:99.2 F (37.3 C) Propofol: none BP: 128/63 and MAP: 83  Labs reviewed; CBGs: 217 and 187 mg/dl, BUN: 39 mg/dl, creatinine: 1.65 mg/dl, Ca: 8.4 mg/dl, Phos: 2.3 mg/dl, GFR: 52 ml/min.  Medications reviewed; sliding scale novolog, 30 mg solu-medrol BID, 40 mg protonix/day. Drips; precedex @ 1.1 mcg/kg/hr, fentanyl @ 125 mcg/hr.    Diet Order:   Diet Order             Diet NPO time specified  Diet effective midnight           Diet NPO time specified  Diet effective midnight           Diet NPO time specified  Diet effective now                   EDUCATION NEEDS:   No education needs have been identified at this time  Skin:  Skin Assessment: Skin Integrity Issues: Skin Integrity Issues:: Stage II, Other (Comment) Stage II: bilateral buttocks Other: MASD coccyx  Last BM:  7/12 (type 6 and type 7); flexiseal placed today  Height:   Ht Readings from Last 1 Encounters:  03/05/21 5' 11"  (1.803 m)    Weight:   Wt Readings from Last 1 Encounters:  03/16/21  100.4 kg      Estimated Nutritional Needs:  Kcal:  2209 kcal Protein:  190-238 grams Fluid:  >/= 2 L/day      Jarome Matin, MS, RD, LDN, CNSC Inpatient Clinical Dietitian RD pager # available in [facility]  After hours/weekend pager # available in [name]

## 2021-03-16 NOTE — Progress Notes (Signed)
I attempted follow up support for Bruce Hunter, but he was not alert and family was not present.  [place], Bcc Pager, [phone] 11:18 AM

## 2021-03-16 NOTE — Progress Notes (Signed)
I provided support to Wadie's friend, Thomasina.  She had no needs at this time, but recommended that we check back when Keoni's mother is visiting this afternoon.  [place], [place] Pager, [phone] 1:09 PM

## 2021-03-16 NOTE — Progress Notes (Signed)
Elink notified of critical hgb 6.5 @ 0645 03/16/21

## 2021-03-16 NOTE — Progress Notes (Signed)
eLink Physician-Brief Progress Note Patient Name: AZARIA ORLANDINI DOB: 1976/08/09 MRN: [id number]   Date of Service  03/16/2021  HPI/Events of Note  Hemoglobin 6.5 gm / dl without evidence of active or overt hemorrhage.  eICU Interventions  Transfuse 1 unit PRBC.        Frederik Pear 03/16/2021, 6:47 AM

## 2021-03-16 NOTE — Progress Notes (Signed)
Chaplain followed up.  Patient's friend Claudette Stapler was smiling broadly, holding patient's hand when chaplain entered room. Mother had just left.   Chaplain offered presence, support and prayer. Rev. Tamsen Snider [phone]

## 2021-03-16 NOTE — Progress Notes (Addendum)
NAME:  Bruce Hunter, MRN:  [id number], DOB:  [birth year], LOS: 12 ADMISSION DATE:  03/04/2021, CONSULTATION DATE:  03/04/21 REFERRING MD:  Melina Copa, CHIEF COMPLAINT:  Shortness of breath.     Brief Narrative:  45 yo man with HIV/AIDS here with acute hypoxemic respiratory failure.  Complaints of SOB and cough productive of tan sputum.  Has had respiratory symptoms since he was discharged earlier this month (6/13).     Recently diagnosed with HIV/AIDS, started on medications, but patient tells me he "hasn't had time to start taking them" but he isn't really able to explain further.  Patient was initiated on lamivudine, Tivicay, dapsone and fluconazole on discharge.  Patient does have an appointment with infectious disease as outpatient.  Candida esophagitis. At the time.    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 Mid June 2022 CD4 count is <35 and initial viral load of 2.7 million entering care at Stage 3. Genotype remains pending. Treated for candida esophagitis on entry to care. [id number] negative.  Initially started on Dovato and Dapsone. DTG plus lamivudine. He can get the combined pill, dovato, upon discharge. Can send rx for dovato for free 30 day supply Esophageal candidiasis = recommend to treat with fluconazole 482m po daily x 14-21 days Opportunistic infection proph = continue on dapsone 1032mdaily   Also baseline  - polyphamracy and neuropathic pain from HIV. DM. Also Marijuana usr   has a past medical history of Asthma, Diabetes mellitus without complication (HCCrocker Gout, Hyperlipidemia, Hypertension, and Obesity.   has a past surgical history that includes No past surgeries; Ganglion cyst excision (Right, 02/26/2020); Esophagogastroduodenoscopy (egd) with propofol (N/A, 02/15/2021); Colonoscopy with propofol (N/A, 02/15/2021); and biopsy (02/15/2021).   Pertinent  Medical History  Asthma DM2 Gout HTN Obesity   GERD AKI Neuropathy  Significant Hospital Events:  6/30 admitted for PNA with known HIV/AIDS 7/1 ETT >>> Echo 7/1 mild  RA dilation with RV low nl fxn mild PAS elevation and nl LV 7/1 - renal consult, ID consult 7/2 HD catheter >>> 7/5, Fi O2 50%, Pneumocystis DFA negative 03/06/21 ([facility]), MSSA on LRCx. Superficial LUE clot 7/6 - ETT tube advancec by CRNA 7/7 - CRRT stopped 7/8 - Expanding antibiotics to include CNS coverage including acyclovir.   7/9 LP. Febrile to 101 Fahrenheit.  White count 17.5 thousand and slightly better.  1PRBC. CRRT restarted. LP -opening pressure 38 cmH2O, cervical pressure 32 cm, clear colorless CSF, 91 mg percent glucose 0 RBC, 1 WBC, protein 30 mg/dL 7/10 continues on CRRT, MV, pressors  7/11 plt downtrending. Cont on MV CRRT NE. Weaning sedation, trial of vent wean x2  7/12 getting 1 PRBC. BAL -- sending for cx, fungal, PCP   Interim History / Subjective:   1 PRBC for hgb 6.5  plt leveled out   More awake this morning  Receiving a breathing treatment before trial of PSV   Off pressors with sedation wean   Objective   Blood pressure (!) 144/75, pulse (!) 107, temperature 97.9 F (36.6 C), resp. rate (!) 32, height _0  (1.803 m), weight 100.4 kg, SpO2 96 %.    Vent Mode: PSV;CPAP FiO2 (%):  [40 %] 40 % Set Rate:  [20 bmp] 20 bmp Vt Set:  [550 mL] 550 mL PEEP:  [8 cmH20] 8 cmH20 Pressure Support:  [8 cmH20] 8 cmH20 Plateau Pressure:  [23 cmH20-30 cmH20] 30 cmH20   Intake/Output Summary (Last 24 hours)  at 03/16/2021 1022 Last data filed at 03/16/2021 0900 Gross per 24 hour  Intake 3184.28 ml  Output 1956 ml  Net 1228.28 ml   Filed Weights   03/14/21 0500 03/15/21 0431 03/16/21 0500  Weight: 97.5 kg 98.4 kg 100.4 kg       General: Critically ill appearing adult M, appears older than stated age. Intubated, sedated.  HEENT: NCAT ETT secure pink mm anicteric sclera. Moderate clear oral secretions  Resp: Rhonchi R>L. Low pitched R  sided inspiratory wheeze. Mechanically ventilated  CV:  tachycardic s1s2 no rgm cap refill < 3sec  GI:  soft round ndnt + bowel sounds  GU: external urinary collection device  Extremities: Upper extremity non-pitting edema. No acute joint deformity. No cyanosis or clubbing  Skin:  Pale, c/d/w no rash  Neurologic: Sedate, awakens spontaneously. PERRL, 74m. Purposeful movement, not following commands    Resolved Hospital Problem list   Shock   Assessment & Plan:   Acute metabolic encephalopathy  -infection, aki, likely icu delirium  P -wean sedation as able -continue gabpentin, seroquel -cont enteral oxy, clonazepam  -delirium precautions  -qtc monitoring   Acute respiratory failure with hypoxia due to multifocal PNA -- suspected PCP with secondary MSSA  -fungitell >500, suggestive of PCP but no confirmation  P -BAL 7/12 -- sending PCP, Cx, fungal, cx  -pt mom amiable to tracheostomy. Will make NPO at midnight in anticipation of likely trach 7/13  -cont abx -VAP, pulm hygiene   Severe sepsis due to multifocal PNA in immunocompromised host  -likely PCP PNA with secondary MSSA. Fungitell >500, presumed candidiasis  -shock improved, off pressors 7/12. 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 gabapentin, scheduled clonaz   Hx HTN  -will add PRN antihypertensives   Best Practice     Diet/type: tf per nutrition  DVT prophylaxis: prophylactic heparin  -  Heparin for CRRT GI prophylaxis: PPI Lines:  HD catheter Foley:  N/A Code Status:  full code Last date of multidisciplinary goals of care discussion -mother updated 7/12. Full code, full scope of care.   LABS    PULMONARY Recent Labs  Lab 03/10/21 2230 03/11/21 1039 03/13/21 1203  PHART 7.372 7.373 7.274*  PCO2ART 46.4 42.8 46.1  PO2ART 76.1* 66.7* 96.8  HCO3 26.3 24.3 20.7  O2SAT 93.4 90.7 96.3    CBC Recent Labs  Lab 03/14/21 0514 03/15/21 0000 03/15/21 1220 03/16/21 0534  HGB 7.5* 7.1*  --  6.5*  HCT 25.6* 24.2*  --  22.5*  WBC 13.1* 14.1*  --  11.0*  PLT 180 133* 127* 157    COAGULATION Recent Labs  Lab 03/13/21 0943 03/15/21 1220  INR 1.1 1.0    CARDIAC  No results for input(s): TROPONINI in the last 168 hours. No results for input(s): PROBNP in the last 168 hours.   CHEMISTRY Recent Labs  Lab 03/12/21 0501 03/13/21 0405 03/13/21 1519  03/14/21 0514 03/14/21 1625 03/15/21 0453 03/15/21 1615 03/16/21 0534  NA 139 137   < > 135  135 136 136 135 138  K 4.6 5.3*   < > 4.3  4.3 4.5 4.5 5.0 5.1  CL 105 102   < > 102  102 103 103 103 104  CO2 24 23   < > _0 GLUCOSE 154* 103*   < > 182*  183* 183* 151* 204* 196*  BUN 85* 98*   < > 70*  71* 53* 41* 37* 39*  CREATININE 2.56* 3.62*  3.51*   < > 2.80*  2.76* 2.23* 1.86*  1.82* 1.83* 1.65*  CALCIUM 8.3* 8.4*   < > 8.0*  8.1* 7.8* 7.7* 7.8* 8.4*  MG 2.0 2.1  --  2.1  --  2.2  --  2.4  PHOS 5.8* 7.8*   < > 6.8* 5.2* 3.0 2.4* 2.3*   < > = values in this interval not displayed.   Estimated Creatinine Clearance: 68.9 mL/min (A) (by C-G formula based on SCr of 1.65 mg/dL (H)).   LIVER Recent Labs  Lab 03/10/21 2239 03/11/21 0520 03/12/21 0501 03/13/21 0943 03/13/21 1519 03/13/21 1730 03/14/21 0514 03/14/21 1625 03/15/21 0453 03/15/21 1116 03/15/21 1220 03/15/21 1615 03/16/21 0534  AST 23 23   --   --  26  --  24  --   --  30  --   --   --   ALT 17 17  --   --  6  --  5  --   --  7  --   --   --   ALKPHOS 65 69  --   --  91  --  91  --   --  144*  --   --   --   BILITOT 0.5 0.4  --   --  0.6  --  0.6  --   --  0.4  --   --   --   PROT 6.7 6.3*  --   --  6.7  --  6.5  --   --  6.7  --   --   --   ALBUMIN 2.0* 1.9*  1.9*   < >  --  1.8*  1.8*   < > 1.8*  1.8* 1.8* 1.5* 1.7*  --  1.7* 1.7*  INR  --   --   --  1.1  --   --   --   --   --   --  1.0  --   --    < > = values in this interval not displayed.     INFECTIOUS Recent Labs  Lab 03/13/21 0914 03/13/21 0915 03/14/21 0514 03/15/21 0453  LATICACIDVEN 1.2  --   --  1.0  PROCALCITON  --  3.86 4.80 5.51     ENDOCRINE CBG (last 3)  Recent Labs    03/15/21 2311 03/16/21 0258 03/16/21 0737  GLUCAP 210* 202* 217*    DG CHEST PORT 1 VIEW  Result Date: 03/15/2021 CLINICAL DATA:  HIV, multifocal pneumonia. EXAM: PORTABLE CHEST 1 VIEW COMPARISON:  03/14/2021. FINDINGS: Endotracheal tube terminates 4.6 cm above the carina. Nasogastric tube is followed into the stomach with the tip projecting beyond the inferior margin of the image. Right IJ central line is in the SVC. Heart is at the upper limits of normal in size to mildly enlarged, stable. Patchy mixed interstitial and airspace  opacification bilaterally, worst in the right upper lobe. Left lower lobe aeration has improved somewhat in the interval. No definite pleural fluid. IMPRESSION: Multi lobar pneumonia with improving aeration in the left lower lobe. Electronically Signed   By: Lorin Picket M.D.   On: 03/15/2021 08:05     CRITICAL CARE Performed by: Cristal Generous  Total critical care time: 48 minutes  Critical care time was exclusive of separately billable procedures and treating other patients. Critical care was necessary to treat or prevent imminent or life-threatening deterioration.  Critical care was time spent personally by me on the following  activities: development of treatment plan with patient and/or surrogate as well as nursing, discussions with consultants, evaluation of patient's response to treatment, examination of patient, obtaining history from patient or surrogate, ordering and performing treatments and interventions, ordering and review of laboratory studies, ordering and review of radiographic studies, pulse oximetry and re-evaluation of patient's condition.  Eliseo Gum MSN, AGACNP-BC [place] for pager  03/16/2021, 10:22 AM

## 2021-03-16 NOTE — Progress Notes (Signed)
[facility] for Infectious Disease    Date of Admission:  03/04/2021     ID: Bruce Hunter is a 45 y.o. male with HIV disease, recently started on ART. Admitted for acute respiratory failure from multifocal pneumonia, probable Pneumocystis pneumonia c/b MSSA infection. 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 Underwent bronch, more specimens sent off. Discussion with mother about tracheostomy  Medications:   sodium chloride   Intravenous Once   alteplase  2 mg Intracatheter Once   alteplase  2 mg Intracatheter Once   ceFAZolin (ANCEF) IVPB 2 gram/100 mL NS (Mini-Bag Plus)  2 g Intravenous Q12H   chlorhexidine gluconate (MEDLINE KIT)  15 mL Mouth Rinse BID   Chlorhexidine Gluconate Cloth  6 each Topical Daily   clonazePAM  1 mg Per Tube BID   darbepoetin (ARANESP) injection - NON-DIALYSIS  40 mcg Subcutaneous Q Sun-1800   dolutegravir  50 mg Per Tube Daily   emtricitabine-tenofovir AF  1 tablet Per Tube Daily   [START ON 03/17/2021] etomidate  40 mg Intravenous Once   [START ON 03/17/2021] feeding supplement (PIVOT 1.5 CAL)  1,000 mL Per Tube Q24H   feeding supplement (PROSource TF)  90 mL Per Tube QID   [START ON 03/17/2021] fentaNYL (SUBLIMAZE) injection  200 mcg Intravenous Once   gabapentin  100 mg Per Tube Q12H   insulin aspart  0-15 Units Subcutaneous Q4H   ipratropium-albuterol  3 mL Nebulization Q4H   mouth rinse  15 mL Mouth Rinse 10 times per day   methylPREDNISolone (SOLU-MEDROL)  injection  30 mg Intravenous Q12H   [START ON 03/17/2021] midazolam  5 mg Intravenous Once   oxyCODONE  5 mg Per Tube Q6H   pantoprazole sodium  40 mg Per Tube QHS   primaquine  30 mg Oral Daily   propofol  500 mg Intravenous Once   QUEtiapine  25 mg Per Tube BID   sodium chloride flush  10-40 mL Intracatheter Q12H   [START ON 03/17/2021] vecuronium  10 mg Intravenous Once    Objective: Vital signs in last 24 hours: Temp:  [97.7 F (36.5 C)-99.2 F (37.3 C)] 97.7 F (36.5 C) (07/12 1528) Pulse Rate:  [44-139] 86 (07/12 1430) Resp:  [13-45] 25 (07/12 1430) BP: (102-189)/(48-128) 125/60 (07/12 1430) SpO2:  [91 %-100 %] 99 % (07/12 1430) FiO2 (%):  [40 %] 40 % (07/12 1200) Weight:  [100.4 kg] 100.4 kg (07/12 0500) Physical Exam  Constitutional: opens eyes to stimulation. He appears well-developed and well-nourished. No distress.  HENT: eyelids more edematous Mouth/Throat: OETT Cardiovascular: Normal rate, regular rhythm and normal heart sounds. Exam reveals no gallop and no friction rub.  No murmur heard.  Pulmonary/Chest: Effort normal and breath sounds normal. No respiratory distress. He has no wheezes.  Abdominal: Soft. Bowel sounds are decreased. He exhibits no distension. There is no tenderness.  Ext: trace edema Neurological: He is alert and oriented to person, place, and time.  Skin: Skin is warm and dry. No rash noted. No erythema.  Psychiatric: He has a normal mood and affect. His behavior is normal.   Lab Results Recent Labs    03/15/21 0000 03/15/21 0453 03/15/21 1615 03/16/21 0534 03/16/21 1300  WBC 14.1*  --   --  11.0*  --   HGB 7.1*  --   --  6.5* 7.3*  HCT 24.2*  --   --  22.5* 24.8*  NA  --    < > 135 138  --   K  --    < > 5.0 5.1  --   CL  --    < > 103 104  --   CO2  --    < > 27 27  --   BUN  --    < > 37* 39*  --   CREATININE  --    < > 1.83* 1.65*  --    < > = values in this interval not displayed.   Liver Panel Recent Labs    03/14/21 0514  03/14/21 1625 03/15/21 1116 03/15/21 1615 03/16/21 0534  PROT 6.5  --  6.7  --   --   ALBUMIN 1.8*  1.8*   < > 1.7* 1.7* 1.7*  AST 24  --  30  --   --   ALT 5  --  7  --   --   ALKPHOS 91  --  144*  --   --   BILITOT 0.6  --  0.4  --   --   BILIDIR  --   --  0.1  --   --   IBILI  --   --  0.3  --   --    < > = values in this interval not displayed.    Microbiology: reviewed Studies/Results: DG CHEST PORT 1 VIEW  Result Date: 03/15/2021 CLINICAL DATA:  HIV, multifocal pneumonia. EXAM: PORTABLE CHEST 1 VIEW COMPARISON:  03/14/2021. FINDINGS: Endotracheal tube terminates 4.6 cm above the carina. Nasogastric tube is followed into the stomach with the tip projecting beyond the inferior margin of the image. Right IJ central line is in the SVC. Heart is at the upper limits of normal in size to mildly enlarged, stable. Patchy mixed interstitial and airspace opacification bilaterally, worst in the right upper lobe. Left lower lobe aeration has improved somewhat in the interval. No definite pleural fluid. IMPRESSION: Multi lobar pneumonia with improving aeration in the left lower lobe. Electronically Signed   By: Lorin Picket M.D.   On: 03/15/2021 08:05     Assessment/Plan: Multifocal pneumonia = continue with pcp treatment with clindamycin and primaquine plus steroids  Hiv disease= will continue with tivicay/descovy. Will check cd 4 count and HIV VL  Esophageal candidiasis (dx by egd on prior admission) = continue on fluconazole, renally dosed  Esrd on CRRT = continue with renal replacement therapy   [facility] for Infectious Diseases Cell: [phone] Pager: [phone]  03/16/2021, 3:33 PM

## 2021-03-17 ENCOUNTER — Inpatient Hospital Stay (HOSPITAL_COMMUNITY): Payer: Medicaid Other

## 2021-03-17 DIAGNOSIS — J9601 Acute respiratory failure with hypoxia: Secondary | ICD-10-CM | POA: Diagnosis not present

## 2021-03-17 DIAGNOSIS — N179 Acute kidney failure, unspecified: Secondary | ICD-10-CM | POA: Diagnosis not present

## 2021-03-17 DIAGNOSIS — G9341 Metabolic encephalopathy: Secondary | ICD-10-CM | POA: Diagnosis not present

## 2021-03-17 DIAGNOSIS — B20 Human immunodeficiency virus [HIV] disease: Secondary | ICD-10-CM | POA: Diagnosis not present

## 2021-03-17 LAB — RENAL FUNCTION PANEL
Albumin: 1.7 g/dL — ABNORMAL LOW (ref 3.5–5.0)
Albumin: 1.8 g/dL — ABNORMAL LOW (ref 3.5–5.0)
Anion gap: 4 — ABNORMAL LOW (ref 5–15)
Anion gap: 8 (ref 5–15)
BUN: 36 mg/dL — ABNORMAL HIGH (ref 6–20)
BUN: 42 mg/dL — ABNORMAL HIGH (ref 6–20)
CO2: 29 mmol/L (ref 22–32)
CO2: 27 mmol/L (ref 22–32)
Calcium: 8.5 mg/dL — ABNORMAL LOW (ref 8.9–10.3)
Calcium: 8.6 mg/dL — ABNORMAL LOW (ref 8.9–10.3)
Chloride: 104 mmol/L (ref 98–111)
Chloride: 102 mmol/L (ref 98–111)
Creatinine, Ser: 1.6 mg/dL — ABNORMAL HIGH (ref 0.61–1.24)
Creatinine, Ser: 1.78 mg/dL — ABNORMAL HIGH (ref 0.61–1.24)
GFR, Estimated: 54 mL/min — ABNORMAL LOW (ref >60)
GFR, Estimated: 48 mL/min — ABNORMAL LOW (ref >60)
Glucose, Bld: 172 mg/dL — ABNORMAL HIGH (ref 70–99)
Glucose, Bld: 175 mg/dL — ABNORMAL HIGH (ref 70–99)
Phosphorus: 2.3 mg/dL — ABNORMAL LOW (ref 2.5–4.6)
Phosphorus: 3.3 mg/dL (ref 2.5–4.6)
Potassium: 4.5 mmol/L (ref 3.5–5.1)
Potassium: 4.5 mmol/L (ref 3.5–5.1)
Sodium: 137 mmol/L (ref 135–145)
Sodium: 137 mmol/L (ref 135–145)

## 2021-03-17 LAB — BLASTOMYCES ANTIGEN: Blastomyces Antigen: NOT DETECTED ng/mL

## 2021-03-17 LAB — GLUCOSE, CAPILLARY
Glucose-Capillary: 155 mg/dL — ABNORMAL HIGH (ref 70–99)
Glucose-Capillary: 167 mg/dL — ABNORMAL HIGH (ref 70–99)
Glucose-Capillary: 169 mg/dL — ABNORMAL HIGH (ref 70–99)
Glucose-Capillary: 180 mg/dL — ABNORMAL HIGH (ref 70–99)
Glucose-Capillary: 183 mg/dL — ABNORMAL HIGH (ref 70–99)
Glucose-Capillary: 186 mg/dL — ABNORMAL HIGH (ref 70–99)

## 2021-03-17 LAB — CSF CULTURE W GRAM STAIN
Culture: NO GROWTH
Gram Stain: NONE SEEN

## 2021-03-17 LAB — CD4/CD8 (T-HELPER/T-SUPPRESSOR CELL)
CD4 absolute: 324 /uL — ABNORMAL LOW (ref 400–1790)
CD4%: 25.59 % — ABNORMAL LOW (ref 33–65)
CD8 T Cell Abs: 543 /uL (ref 190–1000)
CD8tox: 42.84 % — ABNORMAL HIGH (ref 12–40)
Ratio: 0.6 — ABNORMAL LOW (ref 1.0–3.0)
Total lymphocyte count: 1267 /uL (ref 1000–4000)

## 2021-03-17 LAB — TYPE AND SCREEN
ABO/RH(D): O POS
Antibody Screen: NEGATIVE
Unit division: 0
Unit division: 0

## 2021-03-17 LAB — BPAM RBC
Blood Product Expiration Date: 202208112359
Blood Product Expiration Date: 202208112359
ISSUE DATE / TIME: 202207091419
ISSUE DATE / TIME: 202207120812
Unit Type and Rh: 5100
Unit Type and Rh: 5100

## 2021-03-17 LAB — CBC
HCT: 25.4 % — ABNORMAL LOW (ref 39.0–52.0)
Hemoglobin: 7.4 g/dL — ABNORMAL LOW (ref 13.0–17.0)
MCH: 28.2 pg (ref 26.0–34.0)
MCHC: 29.1 g/dL — ABNORMAL LOW (ref 30.0–36.0)
MCV: 96.9 fL (ref 80.0–100.0)
Platelets: 201 10*3/uL (ref 150–400)
RBC: 2.62 MIL/uL — ABNORMAL LOW (ref 4.22–5.81)
RDW: 21.2 % — ABNORMAL HIGH (ref 11.5–15.5)
WBC: 14.8 10*3/uL — ABNORMAL HIGH (ref 4.0–10.5)
nRBC: 0.3 % — ABNORMAL HIGH (ref 0.0–0.2)

## 2021-03-17 LAB — POCT ACTIVATED CLOTTING TIME
Activated Clotting Time: 196 seconds
Activated Clotting Time: 202 seconds
Activated Clotting Time: 208 seconds
Activated Clotting Time: 208 seconds
Activated Clotting Time: 190 seconds

## 2021-03-17 LAB — MAGNESIUM: Magnesium: 2.4 mg/dL (ref 1.7–2.4)

## 2021-03-17 LAB — FUNGUS CULTURE WITH STAIN

## 2021-03-17 LAB — VDRL, CSF: VDRL Quant, CSF: NONREACTIVE

## 2021-03-17 LAB — APTT: aPTT: 89 seconds — ABNORMAL HIGH (ref 24–36)

## 2021-03-17 IMAGING — DX DG ABDOMEN 1V
1 series · 1 of 1 positions shown · non-contrast
Comparison: Radiographs [DATE] and [DATE].  CT [DATE].

CLINICAL DATA: Feeding tube placement.

EXAM:
ABDOMEN - 1 VIEW

[abdomen kub]
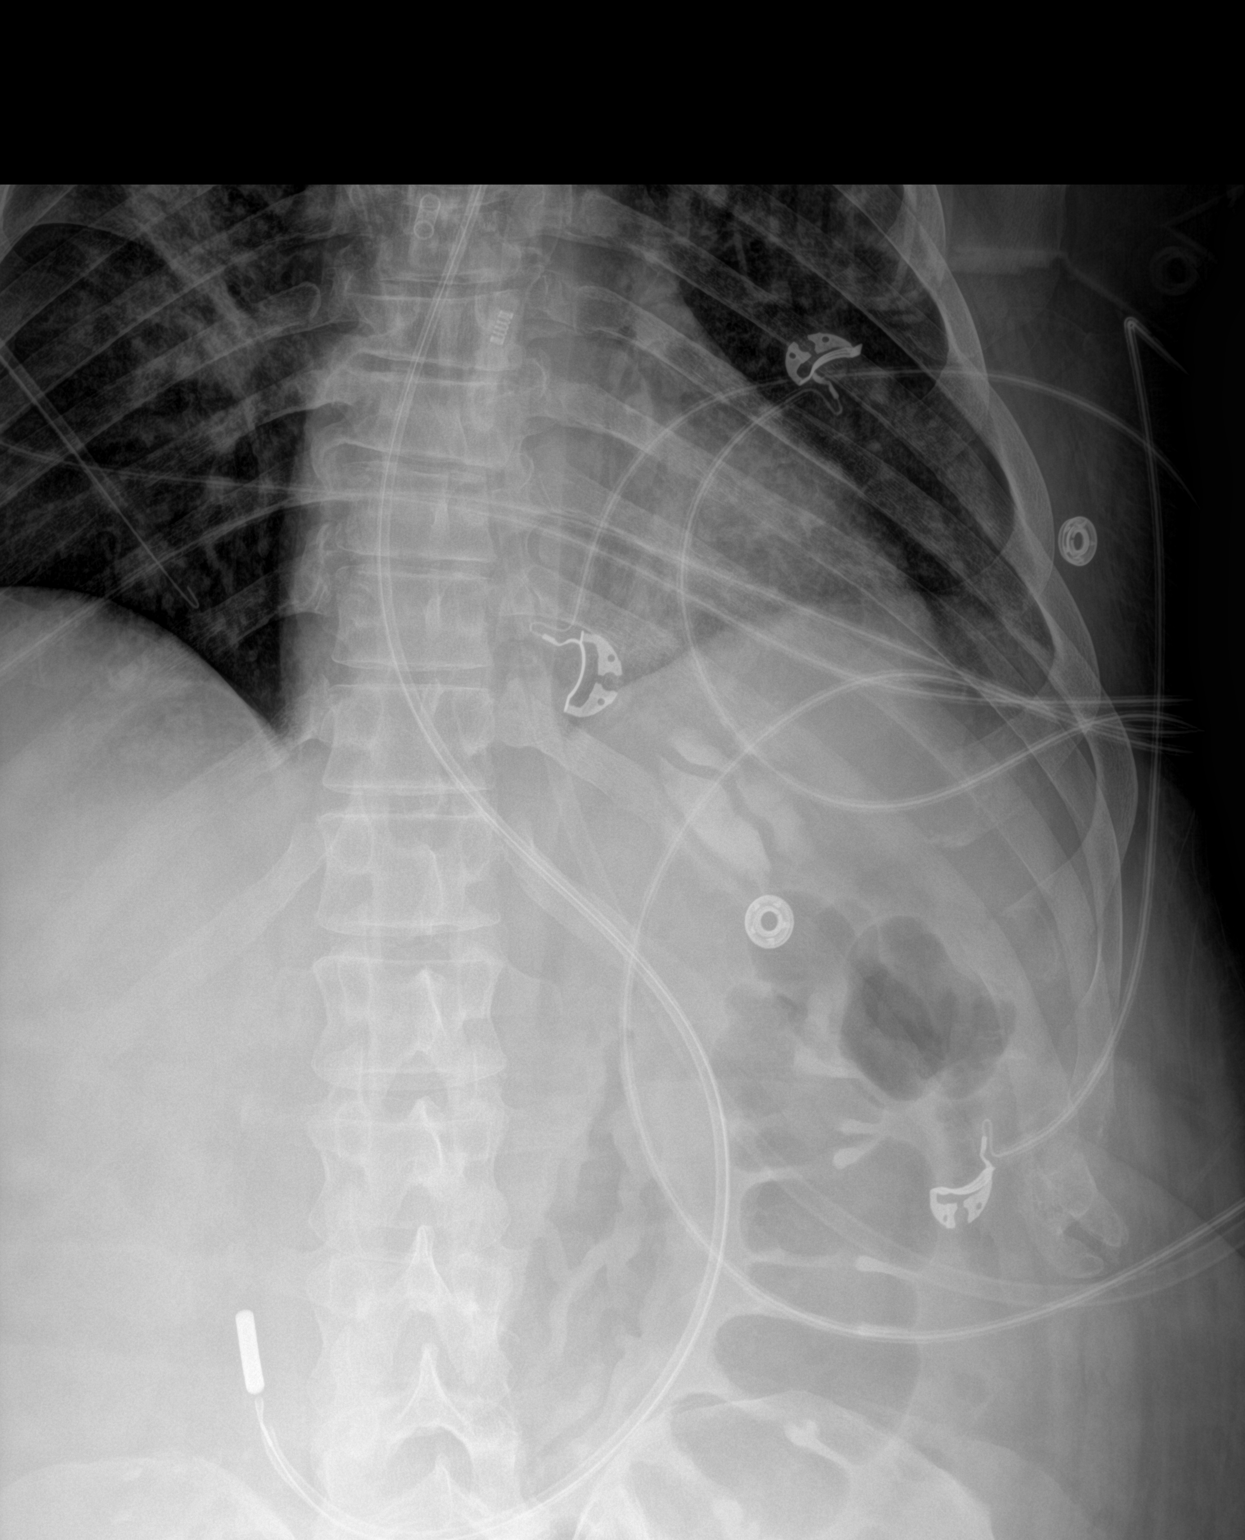

[1 of 1 positions shown; findings below may reference images not displayed]

FINDINGS: [UF] hours. A feeding tube has been placed, projecting to the level
of the proximal duodenum. The visualized bowel gas pattern is
normal. Bilateral pulmonary airspace opacities are similar to recent
chest radiographs.
IMPRESSION: Feeding tube projects to the level of the proximal duodenum.

## 2021-03-17 IMAGING — DX DG CHEST 1V PORT
1 series · 1 of 1 positions shown · non-contrast
Comparison: [DATE]

CLINICAL DATA: Feeding tube placement

EXAM:
PORTABLE CHEST 1 VIEW

[chest ap]
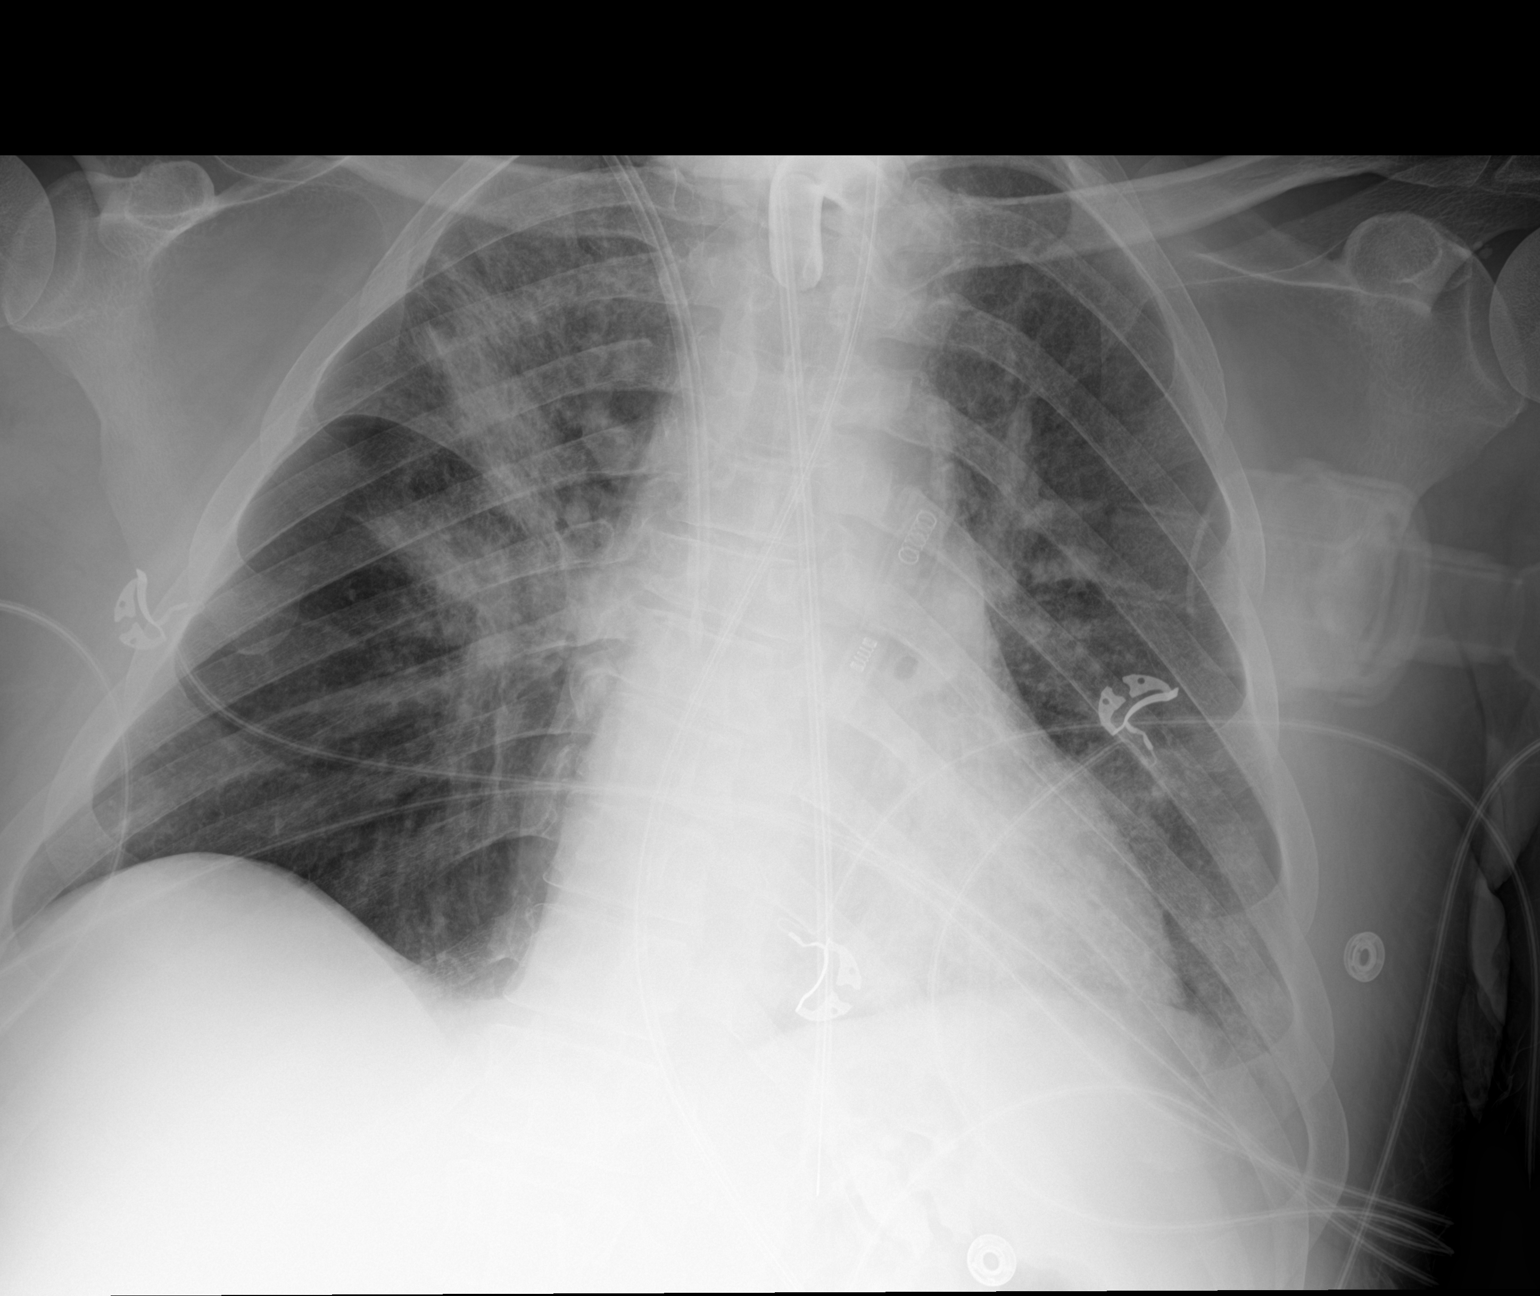

[1 of 1 positions shown; findings below may reference images not displayed]

FINDINGS: Enteric tube passes below the diaphragm and out of field of view.
Right IJ central line is again noted. Endotracheal tube is no longer
present with a tracheostomy device now noted. Persistent right
greater than left perihilar and left lower lung opacities. No
significant pleural effusion. No pneumothorax.
IMPRESSION: Enteric tube passes into the stomach with tip out of field of view.
Tracheostomy device is now present.

Persistent bilateral pulmonary opacities.

## 2021-03-17 MED ORDER — PROPOFOL 500 MG/50ML IV EMUL
INTRAVENOUS | Status: AC
  Filled 2021-03-17: qty 50

## 2021-03-17 MED ORDER — DEXMEDETOMIDINE HCL IN NACL 400 MCG/100ML IV SOLN
0.4000 ug/kg/h | INTRAVENOUS | Status: DC
  Administered 2021-03-17: 1.2 ug/kg/h via INTRAVENOUS
  Administered 2021-03-17 (×2): 1 ug/kg/h via INTRAVENOUS
  Administered 2021-03-17 – 2021-03-18 (×2): 1.1 ug/kg/h via INTRAVENOUS
  Administered 2021-03-18: 1 ug/kg/h via INTRAVENOUS
  Administered 2021-03-18: 0.9 ug/kg/h via INTRAVENOUS
  Administered 2021-03-18 (×2): 1.2 ug/kg/h via INTRAVENOUS
  Administered 2021-03-19: 1 ug/kg/h via INTRAVENOUS
  Administered 2021-03-19: 0.8 ug/kg/h via INTRAVENOUS
  Administered 2021-03-19: 0.9 ug/kg/h via INTRAVENOUS
  Administered 2021-03-19: 0.4 ug/kg/h via INTRAVENOUS
  Administered 2021-03-19 – 2021-03-20 (×2): 0.9 ug/kg/h via INTRAVENOUS
  Administered 2021-03-20: 0.5 ug/kg/h via INTRAVENOUS
  Administered 2021-03-20: 1 ug/kg/h via INTRAVENOUS
  Administered 2021-03-21: 0.3 ug/kg/h via INTRAVENOUS
  Administered 2021-03-22: 0.5 ug/kg/h via INTRAVENOUS
  Filled 2021-03-17 (×11): qty 100
  Filled 2021-03-17: qty 200
  Filled 2021-03-17 (×3): qty 100
  Filled 2021-03-17 (×2): qty 200
  Filled 2021-03-17: qty 100

## 2021-03-17 MED ORDER — PRIMAQUINE PHOSPHATE 26.3 (15 BASE) MG PO TABS
30.0000 mg | ORAL_TABLET | Freq: Every day | ORAL | Status: DC
  Administered 2021-03-17 – 2021-03-22 (×6): 30 mg
  Filled 2021-03-17 (×6): qty 2

## 2021-03-17 NOTE — Progress Notes (Signed)
Nephrology Follow-Up Consult note   Assessment/Recommendations: Bruce Hunter is a/an 45 y.o. male with a past medical history significant for HIV/AIDS, CKD, asthma, obesity, DM2 admitted for acute hypoxic respiratory failure with sepsis.      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  CKD with baseline of 2.5-3 (thus expected to rise at least to same).  Did have some urinary retention so Foley catheter placed. CRRT started on 7/02, held on 7/7 and 7/8 and resumed on 7/09. Continue CRRT.   BP/volume - is up 13 L by I/O and 9kg by wts. Off pressors now and BP's are now high, will ^ UF to 150- 200 cc /hr as tolerated.    Multifocal PNA - ID suspects PCP + secondary MSSA infection. Sp bronch today. May need trach. CXR's most cleared w/ some residual RUL infiltrates  Septic shock: Antibiotics per primary team, off pressors today  Pneumonia: Concern for PJP.  Antibiotics and management per primary  HIV: ID managing  Hypertension: Holding home blood pressure medications given hx of septic shock  DM2: Insulin management per primary  Anemia macrocytic. Likely multifactorial with infection contributing. Transfuse PRBC's prn. Started darbe 40 ug per wk.   Kelly Splinter, MD 03/17/2021, 8:34 AM      Interval History/Subjective:  no UOP, off pressors and BP's are now on the high side. Pulling 100 cc/hr w/o difficulty.   Review of systems: Unable to obtain 2/2 AMS   Medications:  Current Facility-Administered Medications  Medication Dose Route Frequency Provider Last Rate Last Admin    prismasol BGK 4/2.5 infusion   CRRT Continuous Roney Jaffe, MD 400 mL/hr at 03/17/21 0215 New Bag at 03/17/21 0215    prismasol BGK 4/2.5 infusion   CRRT Continuous Roney Jaffe, MD 200 mL/hr at 03/16/21 2332 New Bag at 03/16/21 2332   0.9 %  sodium chloride infusion (Manually program via Guardrails IV Fluids)   Intravenous Once Brand Males,  MD       0.9 %  sodium chloride infusion  250 mL Intravenous Continuous Candee Furbish, MD 10 mL/hr at 03/10/21 0111 Infusion Verify at 03/10/21 0111   0.9 %  sodium chloride infusion  250 mL Intravenous Continuous Anders Simmonds, MD   Stopped at 03/16/21 1203   acetaminophen (TYLENOL) 160 MG/5ML solution 650 mg  650 mg Per Tube Q6H PRN Tanda Rockers, MD   650 mg at 03/13/21 1656   albuterol (PROVENTIL) (2.5 MG/3ML) 0.083% nebulizer solution 2.5 mg  2.5 mg Nebulization Q2H PRN Collier Bullock, MD       alteplase (CATHFLO ACTIVASE) injection 2 mg  2 mg Intracatheter Once Tanda Rockers, MD       alteplase (CATHFLO ACTIVASE) injection 2 mg  2 mg Intracatheter Once Roney Jaffe, MD       ceFAZolin (ANCEF) 2 g in sodium chloride 0.9 % 100 mL IVPB  2 g Intravenous Q12H Polly Cobia, RPH   2 g at 03/16/21 2134   chlorhexidine gluconate (MEDLINE KIT) (PERIDEX) 0.12 % solution 15 mL  15 mL Mouth Rinse BID Candee Furbish, MD   15 mL at 03/17/21 0737   Chlorhexidine Gluconate Cloth 2 % PADS 6 each  6 each Topical Daily Brand Males, MD   6 each at 03/17/21 0533   clindamycin (CLEOCIN) IVPB 900 mg  900 mg Intravenous Eldred Manges, MD   Stopped at 03/17/21 0552   clonazePAM (KLONOPIN) tablet 1 mg  1 mg Per Tube BID Hunsucker, Bonna Gains, MD   1 mg at 03/16/21 2147   Darbepoetin Alfa (ARANESP) injection 40 mcg  40 mcg Subcutaneous Q Sun-1800 Claudia Desanctis, MD   40 mcg at 03/14/21 1817   dexmedetomidine (PRECEDEX) 200 MCG/50ML (4 mcg/mL) infusion  0.4-1.2 mcg/kg/hr Intravenous Titrated Frederik Pear, MD 23.8 mL/hr at 03/17/21 0728 1 mcg/kg/hr at 03/17/21 0728   docusate (COLACE) 50 MG/5ML liquid 100 mg  100 mg Per Tube BID PRN Tanda Rockers, MD   100 mg at 03/08/21 1358   dolutegravir (TIVICAY) tablet 50 mg  50 mg Per Tube Daily Tanda Rockers, MD   50 mg at 03/16/21 1038   emtricitabine-tenofovir AF (DESCOVY) 200-25 MG per tablet 1 tablet  1 tablet Per Tube Daily Carlyle Basques,  MD   1 tablet at 03/16/21 1038   etomidate (AMIDATE) injection 40 mg  40 mg Intravenous Once Cristal Generous, NP       feeding supplement (PIVOT 1.5 CAL) liquid 1,000 mL  1,000 mL Per Tube Q24H Simonne Maffucci B, MD       feeding supplement (PROSource TF) liquid 90 mL  90 mL Per Tube QID Simonne Maffucci B, MD   90 mL at 03/17/21 0809   fentaNYL (SUBLIMAZE) bolus via infusion 25-100 mcg  25-100 mcg Intravenous Q1H PRN Cristal Generous, NP   50 mcg at 03/16/21 1740   fentaNYL (SUBLIMAZE) injection 200 mcg  200 mcg Intravenous Once Bowser, Laurel Dimmer, NP       fentaNYL 2517mg in NS 2560m(1040mml) infusion-PREMIX  0-400 mcg/hr Intravenous Continuous IzqTyna JakschD 15 mL/hr at 03/17/21 0800 150 mcg/hr at 03/17/21 0800   fluconazole (DIFLUCAN) IVPB 400 mg  400 mg Intravenous Q24H WofPolly CobiaPH   Stopped at 03/17/21 0109   gabapentin (NEURONTIN) 250 MG/5ML solution 100 mg  100 mg Per Tube Q12H Bowser, GraLaurel DimmerP   100 mg at 03/16/21 2139   heparin 10,000 units/ 20 mL infusion syringe  250-3,000 Units/hr CRRT Continuous SinGean QuintD 3.9 mL/hr at 03/17/21 0436 1,950 Units/hr at 03/17/21 0436   heparin bolus via infusion syringe 1,000 Units  1,000 Units CRRT PRN SinGean QuintD   1,000 Units at 03/13/21 2328   heparin injection 1,000-6,000 Units  1,000-6,000 Units CRRT PRN FosClaudia DesanctisD       insulin aspart (novoLOG) injection 0-15 Units  0-15 Units Subcutaneous Q4H Ollis, Brandi L, NP   3 Units at 03/17/21 0744   ipratropium-albuterol (DUONEB) 0.5-2.5 (3) MG/3ML nebulizer solution 3 mL  3 mL Nebulization Q4H IzqTyna JakschD   3 mL at 03/17/21 0831   MEDLINE mouth rinse  15 mL Mouth Rinse 10 times per day SmiCandee FurbishD   15 mL at 03/17/21 0533   methylPREDNISolone sodium succinate (SOLU-MEDROL) 40 mg/mL injection 30 mg  30 mg Intravenous Q12H SniCarlyle BasquesD   30 mg at 03/17/21 0459   metoprolol tartrate (LOPRESSOR) injection 2.5-5 mg  2.5-5 mg Intravenous Q3H PRN  BowCristal GenerousP       midazolam (VERSED) injection 1-2 mg  1-2 mg Intravenous Q2H PRN BowCristal GenerousP   2 mg at 03/16/21 1953   midazolam (VERSED) injection 5 mg  5 mg Intravenous Once BowCristal GenerousP       norepinephrine (LEVOPHED) 16 mg in 250m5memix infusion  0-40 mcg/min Intravenous Titrated GreeMinda DittoH  Stopped at 03/16/21 1108   ondansetron (ZOFRAN) injection 4 mg  4 mg Intravenous Q6H PRN Collier Bullock, MD       oxyCODONE (ROXICODONE) 5 MG/5ML solution 5 mg  5 mg Per Tube Q6H Corey Harold, NP   5 mg at 03/17/21 0344   pantoprazole sodium (PROTONIX) 40 mg/20 mL oral suspension 40 mg  40 mg Per Tube QHS Leodis Sias T, RPH   40 mg at 03/16/21 2139   polyethylene glycol (MIRALAX / GLYCOLAX) packet 17 g  17 g Per Tube Daily PRN Tanda Rockers, MD       primaquine tablet 30 mg  30 mg Oral Daily Carlyle Basques, MD   30 mg at 03/16/21 1039   prismasol BGK 4/2.5 infusion   CRRT Continuous Roney Jaffe, MD 1,800 mL/hr at 03/17/21 0650 New Bag at 03/17/21 0650   propofol (DIPRIVAN) 10 mg/mL bolus/IV push 500 mg  500 mg Intravenous Once Cristal Generous, NP       QUEtiapine (SEROQUEL) tablet 25 mg  25 mg Per Tube BID Cristal Generous, NP   25 mg at 03/16/21 2140   sodium chloride flush (NS) 0.9 % injection 10-40 mL  10-40 mL Intracatheter Q12H Tanda Rockers, MD   10 mL at 03/16/21 2153   sodium chloride flush (NS) 0.9 % injection 10-40 mL  10-40 mL Intracatheter PRN Tanda Rockers, MD       vecuronium (NORCURON) injection 10 mg  10 mg Intravenous Once Cristal Generous, NP         Physical Exam: Vitals:   03/17/21 0700 03/17/21 0730  BP: (!) 167/94 (!) 173/92  Pulse: 77 80  Resp: 20 (!) 21  Temp:    SpO2: 99% 98%   Total I/O In: 168.7 [I.V.:38.7; NG/GT:130] Out: 137 [Other:137]  Intake/Output Summary (Last 24 hours) at 03/17/2021 0834 Last data filed at 03/17/2021 0810 Gross per 24 hour  Intake 4057.48 ml  Output 4502 ml  Net -444.52 ml    General  adult male in bed intubated   HEENT normocephalic atraumatic  Neck supple trachea midline Lungs coarse mechanical breath sounds  Heart S1S2 no rub Abdomen soft nontender with limits of sedation distended Extremities 1-2 diffuse edema UE> LE's Neuro sedation running Access: RIJ nontunneled catheter in place   Test Results I personally reviewed new and old clinical labs and radiology tests Lab Results  Component Value Date   NA 137 03/17/2021   K 4.5 03/17/2021   CL 104 03/17/2021   CO2 29 03/17/2021   BUN 36 (H) 03/17/2021   CREATININE 1.60 (H) 03/17/2021   CALCIUM 8.5 (L) 03/17/2021   ALBUMIN 1.7 (L) 03/17/2021   PHOS 2.3 (L) 03/17/2021     [name], MD 03/17/2021 8:34 AM

## 2021-03-17 NOTE — Evaluation (Signed)
SLP Cancellation Note  Patient Details Name: Bruce Hunter MRN: [id number] DOB: 01-14-1976   Cancelled treatment:       Reason Eval/Treat Not Completed: Other (comment) (Order for PMSV and swallow evaluation received, Pt received trach today, Will follow up next date for readiness. Thanks for this order.)  Kathleen Lime, MS [place] Office [phone] Pager [phone]  Bruce Hunter 03/17/2021, 3:50 PM

## 2021-03-17 NOTE — Progress Notes (Addendum)
NAME:  Bruce Hunter, MRN:  [id number], DOB:  December 20, 1975, LOS: 21 ADMISSION DATE:  03/04/2021, CONSULTATION DATE:  03/04/21 REFERRING MD:  Melina Copa, CHIEF COMPLAINT:  Shortness of breath.     Brief Narrative:  45 yo man with HIV/AIDS here with acute hypoxemic respiratory failure.  Complaints of SOB and cough productive of tan sputum.  Has had respiratory symptoms since he was discharged earlier this month (6/13).     Recently diagnosed with HIV/AIDS, started on medications, but patient tells me he "hasn't had time to start taking them" but he isn't really able to explain further.  Patient was initiated on lamivudine, Tivicay, dapsone and fluconazole on discharge.  Patient does have an appointment with infectious disease as outpatient.  Candida esophagitis. At the time.    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 Mid June 2022 CD4 count is <35 and initial viral load of 2.7 million entering care at Stage 3. Genotype remains pending. Treated for candida esophagitis on entry to care. [id number] negative.  Initially started on Dovato and Dapsone. DTG plus lamivudine. He can get the combined pill, dovato, upon discharge. Can send rx for dovato for free 30 day supply Esophageal candidiasis = recommend to treat with fluconazole 416m po daily x 14-21 days Opportunistic infection proph = continue on dapsone 1089mdaily   Also baseline  - polyphamracy and neuropathic pain from HIV. DM. Also Marijuana usr   has a past medical history of Asthma, Diabetes mellitus without complication (HCEstancia Gout, Hyperlipidemia, Hypertension, and Obesity.   has a past surgical history that includes No past surgeries; Ganglion cyst excision (Right, 02/26/2020); Esophagogastroduodenoscopy (egd) with propofol (N/A, 02/15/2021); Colonoscopy with propofol (N/A, 02/15/2021); and biopsy (02/15/2021).   Pertinent  Medical History  Asthma DM2 Gout HTN Obesity   GERD AKI Neuropathy  Significant Hospital Events:  6/30 admitted for PNA with known HIV/AIDS 7/1 ETT >>> Echo 7/1 mild  RA dilation with RV low nl fxn mild PAS elevation and nl LV 7/1 - renal consult, ID consult 7/2 HD catheter >>> 7/5, Fi O2 50%, Pneumocystis DFA negative 03/06/21 ([facility]), MSSA on LRCx. Superficial LUE clot 7/6 - ETT tube advancec by CRNA 7/7 - CRRT stopped 7/8 - Expanding antibiotics to include CNS coverage including acyclovir.   7/9 LP. Febrile to 101 Fahrenheit.  White count 17.5 thousand and slightly better.  1PRBC. CRRT restarted. LP -opening pressure 38 cmH2O, cervical pressure 32 cm, clear colorless CSF, 91 mg percent glucose 0 RBC, 1 WBC, protein 30 mg/dL 7/10 continues on CRRT, MV, pressors  7/11 plt downtrending. Cont on MV CRRT NE. Weaning sedation, trial of vent wean x2  7/12 getting 1 PRBC. BAL -- sending for cx, fungal, PCP 7/13 plan for trach. BAL neg for PCP ag    Interim History / Subjective:  PCP ag from BAL neg White count up a little, but is on steroids Hgb appropriate response after PRBC yesterday Plt continue to improve  Objective   Blood pressure (!) 167/94, pulse 77, temperature 98.3 F (36.8 C), temperature source Axillary, resp. rate 20, height 5' 10.98" (1.803 m), weight 99.2 kg, SpO2 99 %.    Vent Mode: PRVC FiO2 (%):  [40 %] 40 % Set Rate:  [20 bmp] 20 bmp Vt Set:  [550 mL] 550 mL PEEP:  [8 cmH20] 8 cmH20 Plateau Pressure:  [23 cmH20-36 cmH20] 23 cmH20   Intake/Output Summary (Last 24 hours) at 03/17/2021 072947ast data  filed at 03/17/2021 0700 Gross per 24 hour  Intake 3985.78 ml  Output 4424 ml  Net -438.22 ml   Filed Weights   03/15/21 0431 03/16/21 0500 03/17/21 0500  Weight: 98.4 kg 100.4 kg 99.2 kg       General: Critically ill appearing adult M, intubated sedated NAD  HEENT: NCAT ETT secure anicteric sclera pink mmm  Resp: Diminished R sided sounds. Symmetrical chest expansion, incr RR on PSV  CV:  rrr  s1s2 cap refill brisk GI:  soft round ndnt  GU: wnl. External urinary collection device  Extremities: BUE non-pitting edema > BLE. No acute joint deformity no cyanosis or clubbing  Skin:  pale c/d/w no rash  Neurologic: awake, drowsy. Following commands. Trying to write.     Resolved Hospital Problem list   Shock   Assessment & Plan:   Acute metabolic encephalopathy  -infection, AKI, CNS depressing meds, ?ICU delirium  P -continue gabpentin, seroquel -cont enteral oxy, clonazepam  -delirium precautions  -Following trach will resume more aggressive sedation wean   Acute respiratory failure with hypoxia due to multifocal PNA -- suspected PCP with secondary MSSA  -fungitell >500 which is suggestive of  P -Plan for trach 7/13. CXR after  -f/u BAL -- neg for PCP ag  -cont abx -VAP, pulm hygiene   Severe sepsis due to multifocal PNA in immunocomp host -shock improved  -was presumed PCP (fungitell >500( with secondary MSSA -- but BAL with negative PCPag   -WBC up 7/13 from prior - think this may be due to steroids  P -Per ID -- ancef, clinda, fluconazole, primaquine , solumedrol -Follow Cx data -trend fever curve, white count   HIV/AIDs P -ID following -- tivacay, descovy  -Hold EN as needed for ARV administration   AKI on CKD 3b with ATN due to shock requiring CRRT P -CRRT per nephro  -foley out 7/11 -- bladder scan as needed -with Cr improving, improved lytes, normotensive -- wonder if we are approaching a possible trial off of CRRT?   Anemia Thrombocytopenia  -consumptive/critical illness, superimposed on likely anemia of chronic disease with a likely iatrogenic blood loss component  -low plt not c/w dic, hit, ttp  -both improving  P -daily CBC  Chronic pain Anxiety Substance abuse disorder P -cont. gabapentin, scheduled clonaz, enteral oxy   Hx HTN P -will add PRN antihypertensives   Hyperglycemia -critical illness, steroids  P -SSI  Inadequate PO  intake P -OGT will come out 7/13 with trach placement, will ask nursing to place small bore NGT after   Best Practice    Diet/type:  EN on hold for Trach. Resume after  DVT prophylaxis: prophylactic heparin   Heparin for CRRT GI prophylaxis: PPI Lines:  HD catheter Foley:  N/A Code Status:  full code Last date of multidisciplinary goals of care discussion -mother updated 7/12. Full code, full scope of care.  Patient updated 7/13    LABS    PULMONARY Recent Labs  Lab 03/10/21 2230 03/11/21 1039 03/13/21 1203  PHART 7.372 7.373 7.274*  PCO2ART 46.4 42.8 46.1  PO2ART 76.1* 66.7* 96.8  HCO3 26.3 24.3 20.7  O2SAT 93.4 90.7 96.3    CBC Recent Labs  Lab 03/15/21 0000 03/15/21 1220 03/16/21 0534 03/16/21 1300 03/17/21 0500  HGB 7.1*  --  6.5* 7.3* 7.4*  HCT 24.2*  --  22.5* 24.8* 25.4*  WBC 14.1*  --  11.0*  --  14.8*  PLT 133* 127* 157  --  201  COAGULATION Recent Labs  Lab 03/13/21 0943 03/15/21 1220  INR 1.1 1.0    CARDIAC  No results for input(s): TROPONINI in the last 168 hours. No results for input(s): PROBNP in the last 168 hours.   CHEMISTRY Recent Labs  Lab 03/13/21 0405 03/13/21 1519 03/14/21 0514 03/14/21 1625 03/15/21 0453 03/15/21 1615 03/16/21 0534 03/16/21 1628 03/17/21 0500  NA 137   < > 135  135   < > 136 135 138 138 137  K 5.3*   < > 4.3  4.3   < > 4.5 5.0 5.1 4.8 4.5  CL 102   < > 102  102   < > 103 103 104 102 104  CO2 23   < > 25  25   < > _0 GLUCOSE 103*   < > 182*  183*   < > 151* 204* 196* 238* 172*  BUN 98*   < > 70*  71*   < > 41* 37* 39* 37* 36*  CREATININE 3.62*  3.51*   < > 2.80*  2.76*   < > 1.86*  1.82* 1.83* 1.65* 1.70* 1.60*  CALCIUM 8.4*   < > 8.0*  8.1*   < > 7.7* 7.8* 8.4* 8.5* 8.5*  MG 2.1  --  2.1  --  2.2  --  2.4  --  2.4  PHOS 7.8*   < > 6.8*   < > 3.0 2.4* 2.3* 2.3* 2.3*   < > = values in this interval not displayed.   Estimated Creatinine Clearance: 70.8 mL/min (A) (by C-G  formula based on SCr of 1.6 mg/dL (H)).   LIVER Recent Labs  Lab 03/10/21 2239 03/11/21 0520 03/12/21 0501 03/13/21 0943 03/13/21 1519 03/13/21 1730 03/14/21 0514 03/14/21 1625 03/15/21 1116 03/15/21 1220 03/15/21 1615 03/16/21 0534 03/16/21 1628 03/17/21 0500  AST 23 23  --   --  26  --  24  --  30  --   --   --   --   --   ALT 17 17  --   --  6  --  5  --  7  --   --   --   --   --   ALKPHOS 65 69  --   --  91  --  91  --  144*  --   --   --   --   --   BILITOT 0.5 0.4  --   --  0.6  --  0.6  --  0.4  --   --   --   --   --   PROT 6.7 6.3*  --   --  6.7  --  6.5  --  6.7  --   --   --   --   --   ALBUMIN 2.0* 1.9*  1.9*   < >  --  1.8*  1.8*   < > 1.8*  1.8*   < > 1.7*  --  1.7* 1.7* 1.7* 1.7*  INR  --   --   --  1.1  --   --   --   --   --  1.0  --   --   --   --    < > = values in this interval not displayed.     INFECTIOUS Recent Labs  Lab 03/13/21 0914 03/13/21 0915 03/14/21 0514 03/15/21 0453  LATICACIDVEN 1.2  --   --  1.0  PROCALCITON  --  3.86 4.80 5.51    ENDOCRINE CBG (last 3)  Recent Labs    03/16/21 1937 03/16/21 2324 03/17/21 0339  GLUCAP 248* 221* 155*    No results found.  CRITICAL CARE Performed by: Cristal Generous   Total critical care time: 46 minutes  Critical care time was exclusive of separately billable procedures and treating other patients. Critical care was necessary to treat or prevent imminent or life-threatening deterioration.  Critical care was time spent personally by me on the following activities: development of treatment plan with patient and/or surrogate as well as nursing, discussions with consultants, evaluation of patient's response to treatment, examination of patient, obtaining history from patient or surrogate, ordering and performing treatments and interventions, ordering and review of laboratory studies, ordering and review of radiographic studies, pulse oximetry and re-evaluation of patient's  condition.  Eliseo Gum MSN, AGACNP-BC [place] for pager 03/17/2021, 7:08 AM  Attending:    Subjective: Weaned for 3 hours yesterday PJP on BAL negative Weaning this morning Remains on CRRT, anuric  Objective: Vitals:   03/17/21 0700 03/17/21 0730 03/17/21 0800 03/17/21 0900  BP: (!) 167/94 (!) 173/92  (!) 157/85  Pulse: 77 80  (!) 107  Resp: 20 (!) 21  (!) 24  Temp:   98.3 F (36.8 C)   TempSrc:   Axillary   SpO2: 99% 98%  95%  Weight:      Height:       Vent Mode: CPAP;PSV FiO2 (%):  [40 %] 40 % Set Rate:  [20 bmp] 20 bmp Vt Set:  [550 mL] 550 mL PEEP:  [8 cmH20] 8 cmH20 Pressure Support:  [10 cmH20] 10 cmH20 Plateau Pressure:  [0 cmH20-36 cmH20] 0 cmH20  Intake/Output Summary (Last 24 hours) at 03/17/2021 1051 Last data filed at 03/17/2021 1012 Gross per 24 hour  Intake 3961.59 ml  Output 4766 ml  Net -804.41 ml    General:  In bed on vent HENT: NCAT ETT in place PULM: CTA B, vent supported breathing CV: RRR, no mgr GI: BS+, soft, nontender MSK: normal bulk and tone Neuro: sedated on vent    CBC    Component Value Date/Time   WBC 14.8 (H) 03/17/2021 0500   RBC 2.62 (L) 03/17/2021 0500   HGB 7.4 (L) 03/17/2021 0500   HGB 8.9 (L) 02/13/2021 1107   HCT 25.4 (L) 03/17/2021 0500   PLT 201 03/17/2021 0500   MCV 96.9 03/17/2021 0500   MCH 28.2 03/17/2021 0500   MCHC 29.1 (L) 03/17/2021 0500   RDW 21.2 (H) 03/17/2021 0500   LYMPHSABS 0.7 03/15/2021 0000   MONOABS 2.7 (H) 03/15/2021 0000   EOSABS 0.4 03/15/2021 0000   BASOSABS 0.0 03/15/2021 0000    BMET    Component Value Date/Time   NA 137 03/17/2021 0500   K 4.5 03/17/2021 0500   CL 104 03/17/2021 0500   CO2 29 03/17/2021 0500   GLUCOSE 172 (H) 03/17/2021 0500   BUN 36 (H) 03/17/2021 0500   CREATININE 1.60 (H) 03/17/2021 0500   CALCIUM 8.5 (L) 03/17/2021 0500   GFRNONAA 54 (L) 03/17/2021 0500   GFRAA >60 05/29/2011 2212    CXR images no new  chest x-ray  Impression/Plan: Acute respiratory failure due to MSSA pneumonia and presumed PJP, PJP stain negative on BAL from yesterday but unclear what that means after several days of treatment: not strong enough for extubation, continue antibiotics per ID, plan tracheostomy today  as this will hopefully facilitate weaning of ventilator, trach care per routine AKI> CRRT per renal; should we consider a holiday this week sometime?  Thrombocytopenia> improved, monitor HIV infection> continue anti-retrovirals per ID  My cc time 20 minutes  Roselie Awkward, MD [place] [facility] Pager: [phone] Cell: [phone] After 7pm: [phone]

## 2021-03-17 NOTE — Procedures (Signed)
Bronchoscopy Procedure Note  OCONNOR ERKER  [id number]  [birth year]  Date:03/17/21  Time:2:33 PM   Provider Performing:Brent Taiana Temkin   Procedure(s):  Initial Therapeutic Aspiration of Tracheobronchial Tree [phone])  Indication(s) Thick mucus plug occluding airway   Consent Risks of the procedure as well as the alternatives and risks of each were explained to the patient and/or caregiver.  Consent for the procedure was obtained and is signed in the bedside chart  Anesthesia Moderate sedation: on fentanyl infusion, precedex infusion, received fentanyl 261mg, versed '4mg'$ , etomidate '20mg'$ , vecuronium '10mg'$     Time Out Verified patient identification, verified procedure, site/side was marked, verified correct patient position, special equipment/implants available, medications/allergies/relevant history reviewed, required imaging and test results available.   Sterile Technique Usual hand hygiene, masks, gowns, and gloves were used   Procedure Description Bronchoscope advanced through endotracheal tube and into airway.  Airways were examined down to subsegmental level with findings noted below.   Following diagnostic evaluation, Therapeutic aspiration performed in Trachea: thick, dry mucus which was occluding the trachea at the level of the opening of the tracheostomy tube was removed by suction.  Findings: as detailed above.  At the completion of the procedure the trachea was clear.  The remainder of the airways were inspected to the subsegmental level.  No airway mass seen, however there was thick, dry mucus throughout the airways, worse in the right lower lobe.     Complications/Tolerance None; patient tolerated the procedure well. Chest X-ray is needed post procedure.   EBL Minimal   Specimen(s) None  BRoselie Awkward MD [place] [facility] Pager: [phone]: [phone] 7:00 pm call Elink  [phone]

## 2021-03-17 NOTE — Procedures (Signed)
Diagnostic Bronchoscopy  Bruce Hunter  [id number]  1976/07/31  Date:03/17/21  Time:2:39 PM   Provider Performing:Bruce Hunter   Procedure: Diagnostic Bronchoscopy [id number])  Indication(s) Assist with direct visualization of tracheostomy placement  Consent Risks of the procedure as well as the alternatives and risks of each were explained to the patient and/or caregiver.  Consent for the procedure was obtained.   Anesthesia See separate tracheostomy note   Time Out Verified patient identification, verified procedure, site/side was marked, verified correct patient position, special equipment/implants available, medications/allergies/relevant history reviewed, required imaging and test results available.   Sterile Technique Usual hand hygiene, masks, gowns, and gloves were used   Procedure Description Bronchoscope advanced through endotracheal tube and into airway.  After suctioning out tracheal secretions, bronchoscope used to provide direct visualization of tracheostomy placement.   Complications/Tolerance None; patient tolerated the procedure well.   EBL None  Specimen(s) None

## 2021-03-17 NOTE — Progress Notes (Signed)
[facility] for Infectious Disease    Date of Admission:  03/04/2021   Total days of antibiotics 14           ID: Bruce Hunter is a 45 y.o. 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)   Elevated LFTs   Septic shock (HCC)   Pressure injury of skin   Acute metabolic encephalopathy   Severe sepsis (HCC)  Subjective: Afebrile, had trach placed without difficulty  Medications:   sodium chloride   Intravenous Once   alteplase  2 mg Intracatheter Once   alteplase  2 mg Intracatheter Once   ceFAZolin (ANCEF) IVPB 2 gram/100 mL NS (Mini-Bag Plus)  2 g Intravenous Q12H   chlorhexidine gluconate (MEDLINE KIT)  15 mL Mouth Rinse BID   Chlorhexidine Gluconate Cloth  6 each Topical Daily   clonazePAM  1 mg Per Tube BID   darbepoetin (ARANESP) injection - NON-DIALYSIS  40 mcg Subcutaneous Q Sun-1800   dolutegravir  50 mg Per Tube Daily   emtricitabine-tenofovir AF  1 tablet Per Tube Daily   feeding supplement (PIVOT 1.5 CAL)  1,000 mL Per Tube Q24H   feeding supplement (PROSource TF)  90 mL Per Tube QID   gabapentin  100 mg Per Tube Q12H   insulin aspart  0-15 Units Subcutaneous Q4H   ipratropium-albuterol  3 mL Nebulization Q4H   mouth rinse  15 mL Mouth Rinse 10 times per day   methylPREDNISolone (SOLU-MEDROL) injection  30 mg Intravenous Q12H   oxyCODONE  5 mg Per Tube Q6H   pantoprazole sodium  40 mg Per Tube QHS   primaquine  30 mg Per Tube Daily   QUEtiapine  25 mg Per Tube BID   sodium chloride flush  10-40 mL Intracatheter Q12H   Objective: Vital signs in last 24 hours: Temp:  [97.9 F (36.6 C)-98.5 F (36.9 C)] 98.5 F (36.9 C) (07/13 1600) Pulse Rate:  [38-108] 90 (07/13 1630) Resp:   [15-39] 20 (07/13 1630) BP: (119-182)/(65-105) 134/72 (07/13 1630) SpO2:  [90 %-100 %] 97 % (07/13 1630) FiO2 (%):  [40 %] 40 % (07/13 1552) Weight:  [99.2 kg] 99.2 kg (07/13 0500) Physical Exam  Constitutional: eyes open intermittently He appears well-developed and well-nourished. No distress.  HENT: trach and dophoff in place Mouth/Throat: Oropharynx is clear and moist. No oropharyngeal exudate.  Cardiovascular: Normal rate, regular rhythm and normal heart sounds. Exam reveals no gallop and no friction rub.  No murmur heard.  Pulmonary/Chest: Effort normal and breath sounds normal. No respiratory distress. He has no wheezes.  Abdominal: Soft. Bowel sounds are normal. He exhibits no distension. There is no tenderness.  Lymphadenopathy:  He has no cervical adenopathy.  Neurological: He is alert and oriented to person, place, and time.  Skin: Skin is warm and dry. No rash noted. No erythema.  Psychiatric: He has a normal mood and affect. His behavior is normal.    Lab Results Recent Labs    03/16/21 0534 03/16/21 1300 03/16/21 1628 03/17/21 0500  WBC 11.0*  --   --  14.8*  HGB 6.5* 7.3*  --  7.4*  HCT 22.5* 24.8*  --  25.4*  NA 138  --  138 137  K 5.1  --  4.8 4.5  CL 104  --  102 104  CO2 27  --  27 29  BUN 39*  --  37* 36*  CREATININE 1.65*  --  1.70* 1.60*   Liver Panel Recent Labs    03/15/21 1116 03/15/21 1615 03/16/21 1628 03/17/21 0500  PROT 6.7  --   --   --   ALBUMIN 1.7*   < > 1.7* 1.7*  AST 30  --   --   --   ALT 7  --   --   --   ALKPHOS 144*  --   --   --   BILITOT 0.4  --   --   --   BILIDIR 0.1  --   --   --   IBILI 0.3  --   --   --    < > = values in this interval not displayed.   Micro: BAL culture= staph epi  Studies/Results: DG Abd 1 View  Result Date: 03/17/2021 CLINICAL DATA:  Feeding tube placement. EXAM: ABDOMEN - 1 VIEW COMPARISON:  Radiographs 03/12/2021 and 03/06/2021.  CT 03/13/2021. FINDINGS: 1513 hours. A feeding tube has been  placed, projecting to the level of the proximal duodenum. The visualized bowel gas pattern is normal. Bilateral pulmonary airspace opacities are similar to recent chest radiographs. IMPRESSION: Feeding tube projects to the level of the proximal duodenum. Electronically Signed   By: Richardean Sale M.D.   On: 03/17/2021 15:49   DG Chest Port 1 View  Result Date: 03/17/2021 CLINICAL DATA:  Feeding tube placement EXAM: PORTABLE CHEST 1 VIEW COMPARISON:  03/15/2021 FINDINGS: Enteric tube passes below the diaphragm and out of field of view. Right IJ central line is again noted. Endotracheal tube is no longer present with a tracheostomy device now noted. Persistent right greater than left perihilar and left lower lung opacities. No significant pleural effusion. No pneumothorax. IMPRESSION: Enteric tube passes into the stomach with tip out of field of view. Tracheostomy device is now present. Persistent bilateral pulmonary opacities. Electronically Signed   By: Macy Mis M.D.   On: 03/17/2021 15:50     Assessment/Plan: Pneumocystis pneumonia complicated by staph species = clindamycin and primaquine - and cefazolin. Plus on steroids  HIV disease= tivicay and descovy  Respiratory distress = s/p trach.  Candidal esophagitis = continue on fluconazole for one more day.  [facility] for Infectious Diseases Cell: [phone] Pager: [phone]  03/17/2021, 4:42 PM

## 2021-03-17 NOTE — Progress Notes (Signed)
I attempted a follow up visit but pt was not alert and family not present.  Ida Grove, [place] Pager, [phone] 4:33 PM

## 2021-03-17 NOTE — Plan of Care (Signed)
Problem: Education: Goal: Knowledge of General Education information will improve Description: Including pain rating scale, medication(s)/side effects and non-pharmacologic comfort measures 03/17/2021 0242 by Raiford Simmonds, RN Outcome: Progressing 03/17/2021 0241 by Raiford Simmonds, RN Outcome: Progressing   Problem: Health Behavior/Discharge Planning: Goal: Ability to manage health-related needs will improve 03/17/2021 0242 by Bookert Guzzi, Jenell Milliner, RN Outcome: Progressing 03/17/2021 0241 by Raiford Simmonds, RN Outcome: Progressing   Problem: Clinical Measurements: Goal: Ability to maintain clinical measurements within normal limits will improve 03/17/2021 0242 by Peony Barner, Jenell Milliner, RN Outcome: Progressing 03/17/2021 0241 by Raiford Simmonds, RN Outcome: Progressing Goal: Will remain free from infection 03/17/2021 0242 by Estell Dillinger, Jenell Milliner, RN Outcome: Progressing 03/17/2021 0241 by Raiford Simmonds, RN Outcome: Progressing Goal: Diagnostic test results will improve 03/17/2021 0242 by Raiford Simmonds, RN Outcome: Progressing 03/17/2021 0241 by Raiford Simmonds, RN Outcome: Progressing Goal: Respiratory complications will improve 03/17/2021 0242 by Raiford Simmonds, RN Outcome: Progressing 03/17/2021 0241 by Raiford Simmonds, RN Outcome: Progressing Goal: Cardiovascular complication will be avoided 03/17/2021 0242 by Raiford Simmonds, RN Outcome: Progressing 03/17/2021 0241 by Raiford Simmonds, RN Outcome: Progressing   Problem: Activity: Goal: Risk for activity intolerance will decrease 03/17/2021 0242 by Maxamus Colao, Jenell Milliner, RN Outcome: Progressing 03/17/2021 0241 by Raiford Simmonds, RN Outcome: Progressing   Problem: Nutrition: Goal: Adequate nutrition will be maintained 03/17/2021 0242 by Zimere Dunlevy, Jenell Milliner, RN Outcome: Progressing 03/17/2021 0241  by Raiford Simmonds, RN Outcome: Progressing   Problem: Coping: Goal: Level of anxiety will decrease 03/17/2021 0242 by Raiford Simmonds, RN Outcome: Progressing 03/17/2021 0241 by Raiford Simmonds, RN Outcome: Progressing   Problem: Elimination: Goal: Will not experience complications related to bowel motility 03/17/2021 0242 by Raiford Simmonds, RN Outcome: Progressing 03/17/2021 0241 by Raiford Simmonds, RN Outcome: Progressing Goal: Will not experience complications related to urinary retention 03/17/2021 0242 by Lindee Leason, Jenell Milliner, RN Outcome: Progressing 03/17/2021 0241 by Raiford Simmonds, RN Outcome: Progressing   Problem: Pain Managment: Goal: General experience of comfort will improve 03/17/2021 0242 by Rodina Pinales, Jenell Milliner, RN Outcome: Progressing 03/17/2021 0241 by Raiford Simmonds, RN Outcome: Progressing   Problem: Safety: Goal: Ability to remain free from injury will improve 03/17/2021 0242 by Diane Hanel, Jenell Milliner, RN Outcome: Progressing 03/17/2021 0241 by Raiford Simmonds, RN Outcome: Progressing   Problem: Skin Integrity: Goal: Risk for impaired skin integrity will decrease 03/17/2021 0242 by Raiford Simmonds, RN Outcome: Progressing 03/17/2021 0241 by Raiford Simmonds, RN Outcome: Progressing   Problem: Safety: Goal: Non-violent Restraint(s) 03/17/2021 0242 by Raiford Simmonds, RN Outcome: Progressing 03/17/2021 0241 by Raiford Simmonds, RN Outcome: Progressing   Problem: Fluid Volume: Goal: Hemodynamic stability will improve 03/17/2021 0242 by Raiford Simmonds, RN Outcome: Progressing 03/17/2021 0241 by Raiford Simmonds, RN Outcome: Progressing   Problem: Clinical Measurements: Goal: Diagnostic test results will improve 03/17/2021 0242 by Tanyla Stege, Jenell Milliner, RN Outcome: Progressing 03/17/2021 0241 by Raiford Simmonds, RN Outcome: Progressing Goal: Signs and symptoms of infection will decrease 03/17/2021 0242 by Laporshia Hogen, Jenell Milliner, RN Outcome: Progressing 03/17/2021 0241 by Raiford Simmonds, RN Outcome: Progressing   Problem: Respiratory: Goal: Ability to maintain adequate ventilation will improve 03/17/2021 0242 by Laurene Footman  Mechele Collin, RN Outcome: Progressing 03/17/2021 0241 by Raiford Simmonds, RN Outcome: Progressing

## 2021-03-17 NOTE — Progress Notes (Signed)
MD at bedside for tracheostomy. RN ordered to administered 4 mg Versed, 20 mg Etomidate, 200 mcg Fentanyl (via bolus on pump) and 10 mg Vecuronium. See MAR for more details.

## 2021-03-17 NOTE — Procedures (Signed)
Tracheostomy Change Note  Patient Details:   Name: ADALI CALAMIA DOB: 17-Feb-1976 MRN: [id number]    Airway Documentation:     Evaluation  O2 sats: stable throughout Complications: No apparent complications Patient did tolerate procedure well. Bilateral Breath Sounds: Diminished    Luretta Everly A Siria Calandro 03/17/2021, 2:35 PM

## 2021-03-17 NOTE — Procedures (Signed)
Percutaneous Tracheostomy Procedure Note   Bruce Hunter  [id number]  Jan 07, 1976  Date:03/17/21  Time:2:32 PM   Provider Performing:Brent Twania Bujak  Procedure: Percutaneous Tracheostomy with Bronchoscopic Guidance ([id number])  Indication(s) Need for prolonged mechanical ventilation Acute respiratory failure with hypoxemia  Consent Risks of the procedure as well as the alternatives and risks of each were explained to the patient and/or caregiver.  Consent for the procedure was obtained.  Anesthesia Etomidate, Versed, Fentanyl, Vecuronium   Time Out Verified patient identification, verified procedure, site/side was marked, verified correct patient position, special equipment/implants available, medications/allergies/relevant history reviewed, required imaging and test results available.   Sterile Technique Maximal sterile technique including sterile barrier drape, hand hygiene, sterile gown, sterile gloves, mask, hair covering.    Procedure Description Appropriate anatomy identified by palpation.  Patient's neck prepped and draped in sterile fashion.  1% lidocaine with epinephrine was used to anesthetize skin overlying neck.  1.5cm incision made and blunt dissection performed until tracheal rings could be easily palpated.   Then a size 8 Shiley tracheostomy was placed under bronchoscopic visualization using usual Seldinger technique and serial dilation.   Bronchoscope confirmed placement above the carina.  Tracheostomy was sutured in place with adhesive pad to protect skin under pressure.    Patient connected to ventilator.   Complications/Tolerance None; patient tolerated the procedure well. Chest X-ray is ordered to confirm no post-procedural complication.   EBL Minimal   Specimen(s) None   Roselie Awkward, MD [facility] Pager: [phone] Cell: [phone] After 7:00 pm call Elink  [phone]

## 2021-03-18 DIAGNOSIS — B59 Pneumocystosis: Secondary | ICD-10-CM | POA: Diagnosis not present

## 2021-03-18 DIAGNOSIS — B3781 Candidal esophagitis: Secondary | ICD-10-CM | POA: Diagnosis not present

## 2021-03-18 DIAGNOSIS — G9341 Metabolic encephalopathy: Secondary | ICD-10-CM | POA: Diagnosis not present

## 2021-03-18 DIAGNOSIS — B20 Human immunodeficiency virus [HIV] disease: Secondary | ICD-10-CM | POA: Diagnosis not present

## 2021-03-18 DIAGNOSIS — J9601 Acute respiratory failure with hypoxia: Secondary | ICD-10-CM | POA: Diagnosis not present

## 2021-03-18 LAB — CULTURE, RESPIRATORY W GRAM STAIN
Culture: NORMAL
Gram Stain: NONE SEEN

## 2021-03-18 LAB — CBC
HCT: 25.8 % — ABNORMAL LOW (ref 39.0–52.0)
Hemoglobin: 7.4 g/dL — ABNORMAL LOW (ref 13.0–17.0)
MCH: 28.2 pg (ref 26.0–34.0)
MCHC: 28.7 g/dL — ABNORMAL LOW (ref 30.0–36.0)
MCV: 98.5 fL (ref 80.0–100.0)
Platelets: 182 10*3/uL (ref 150–400)
RBC: 2.62 MIL/uL — ABNORMAL LOW (ref 4.22–5.81)
RDW: 21.6 % — ABNORMAL HIGH (ref 11.5–15.5)
WBC: 15.5 10*3/uL — ABNORMAL HIGH (ref 4.0–10.5)
nRBC: 0.3 % — ABNORMAL HIGH (ref 0.0–0.2)

## 2021-03-18 LAB — RENAL FUNCTION PANEL
Albumin: 1.9 g/dL — ABNORMAL LOW (ref 3.5–5.0)
Albumin: 2.1 g/dL — ABNORMAL LOW (ref 3.5–5.0)
Anion gap: 7 (ref 5–15)
Anion gap: 7 (ref 5–15)
BUN: 42 mg/dL — ABNORMAL HIGH (ref 6–20)
BUN: 43 mg/dL — ABNORMAL HIGH (ref 6–20)
CO2: 28 mmol/L (ref 22–32)
CO2: 26 mmol/L (ref 22–32)
Calcium: 8.8 mg/dL — ABNORMAL LOW (ref 8.9–10.3)
Calcium: 8.9 mg/dL (ref 8.9–10.3)
Chloride: 104 mmol/L (ref 98–111)
Chloride: 102 mmol/L (ref 98–111)
Creatinine, Ser: 1.67 mg/dL — ABNORMAL HIGH (ref 0.61–1.24)
Creatinine, Ser: 1.69 mg/dL — ABNORMAL HIGH (ref 0.61–1.24)
GFR, Estimated: 51 mL/min — ABNORMAL LOW (ref >60)
GFR, Estimated: 51 mL/min — ABNORMAL LOW (ref >60)
Glucose, Bld: 214 mg/dL — ABNORMAL HIGH (ref 70–99)
Glucose, Bld: 182 mg/dL — ABNORMAL HIGH (ref 70–99)
Phosphorus: 3.1 mg/dL (ref 2.5–4.6)
Phosphorus: 2.9 mg/dL (ref 2.5–4.6)
Potassium: 4.2 mmol/L (ref 3.5–5.1)
Potassium: 4.4 mmol/L (ref 3.5–5.1)
Sodium: 139 mmol/L (ref 135–145)
Sodium: 135 mmol/L (ref 135–145)

## 2021-03-18 LAB — POCT ACTIVATED CLOTTING TIME
Activated Clotting Time: 219 seconds
Activated Clotting Time: 196 seconds
Activated Clotting Time: 207 seconds
Activated Clotting Time: 202 seconds
Activated Clotting Time: 190 seconds
Activated Clotting Time: 179 seconds
Activated Clotting Time: 173 seconds

## 2021-03-18 LAB — MAGNESIUM: Magnesium: 2.4 mg/dL (ref 1.7–2.4)

## 2021-03-18 LAB — APTT: aPTT: 77 seconds — ABNORMAL HIGH (ref 24–36)

## 2021-03-18 LAB — GLUCOSE, CAPILLARY
Glucose-Capillary: 192 mg/dL — ABNORMAL HIGH (ref 70–99)
Glucose-Capillary: 228 mg/dL — ABNORMAL HIGH (ref 70–99)
Glucose-Capillary: 196 mg/dL — ABNORMAL HIGH (ref 70–99)
Glucose-Capillary: 162 mg/dL — ABNORMAL HIGH (ref 70–99)
Glucose-Capillary: 211 mg/dL — ABNORMAL HIGH (ref 70–99)
Glucose-Capillary: 219 mg/dL — ABNORMAL HIGH (ref 70–99)

## 2021-03-18 LAB — HIV-1 RNA QUANT-NO REFLEX-BLD
HIV 1 RNA Quant: 240 copies/mL
LOG10 HIV-1 RNA: 2.38 log10copy/mL

## 2021-03-18 MED ORDER — METHYLPREDNISOLONE SODIUM SUCC 40 MG IJ SOLR
30.0000 mg | Freq: Every day | INTRAMUSCULAR | Status: DC
  Administered 2021-03-19: 30 mg via INTRAVENOUS
  Filled 2021-03-18: qty 1

## 2021-03-18 MED ORDER — HEPARIN SODIUM (PORCINE) 5000 UNIT/ML IJ SOLN
5000.0000 [IU] | Freq: Three times a day (TID) | INTRAMUSCULAR | Status: DC
  Administered 2021-03-18 – 2021-03-30 (×36): 5000 [IU] via SUBCUTANEOUS
  Filled 2021-03-18 (×37): qty 1

## 2021-03-18 MED ORDER — FENTANYL CITRATE (PF) 100 MCG/2ML IJ SOLN
25.0000 ug | INTRAMUSCULAR | Status: DC | PRN
  Administered 2021-03-18: 100 ug via INTRAVENOUS
  Administered 2021-03-18: 50 ug via INTRAVENOUS
  Administered 2021-03-18: 100 ug via INTRAVENOUS
  Administered 2021-03-18 – 2021-03-19 (×2): 50 ug via INTRAVENOUS
  Administered 2021-03-19 – 2021-03-21 (×12): 100 ug via INTRAVENOUS
  Administered 2021-03-23 – 2021-03-24 (×4): 50 ug via INTRAVENOUS
  Administered 2021-03-25: 100 ug via INTRAVENOUS
  Filled 2021-03-18 (×23): qty 2

## 2021-03-18 MED ORDER — LINEZOLID 100 MG/5ML PO SUSR
600.0000 mg | Freq: Two times a day (BID) | ORAL | Status: DC
  Filled 2021-03-18: qty 30

## 2021-03-18 MED ORDER — LIDOCAINE 5 % EX PTCH
1.0000 | MEDICATED_PATCH | CUTANEOUS | Status: DC
  Administered 2021-03-18 – 2021-03-29 (×12): 1 via TRANSDERMAL
  Filled 2021-03-18 (×13): qty 1

## 2021-03-18 MED ORDER — LINEZOLID 600 MG PO TABS
600.0000 mg | ORAL_TABLET | Freq: Two times a day (BID) | ORAL | Status: DC
  Administered 2021-03-18 – 2021-03-22 (×10): 600 mg
  Filled 2021-03-18 (×10): qty 1

## 2021-03-18 NOTE — Progress Notes (Signed)
Per [place] MD, if CRRT filter clots overnight do not restart.

## 2021-03-18 NOTE — Progress Notes (Signed)
Per Melvia Heaps MD, RN to check ACTs every 6 hours overnight. Heparin max rate 1,500 units/hr at this time. RN to recheck ACT at 1800.

## 2021-03-18 NOTE — Evaluation (Addendum)
Physical Therapy Evaluation Patient Details Name: Bruce Hunter MRN: [id number] DOB: 1975/10/07 Today's Date: 03/18/2021   History of Present Illness  Bruce Hunter is a/an 45 y.o. male with a past medical history significant for HIV/AIDS, CKD, asthma, obesity, DM2 admitted for acute hypoxic respiratory failure with sepsis, requiring ventilator support, CRRT, S/P tracheostomy 03/17/21.  Clinical Impression  The patient  is resting in bed with friend at bedside to provide history. Patient is awake and able to nod for yes/no answers. Patient is profoundly weak in Both legs with trace strength  throughout.  Friend reports patient has pain in both feet PTA. Patient winces with Ankle rom.  Patient currently on CRRT which may be stopping soon.  Patient will benefit from OPT to increase strength and begin mobility once CRRT is  stopped. Pt admitted with above diagnosis.  Pt currently with functional limitations due to the deficits listed below (see PT Problem List). Pt will benefit from skilled PT to increase their independence and safety with mobility to allow discharge to the venue listed below.        Follow Up Recommendations LTACH;SNF    Equipment Recommendations  Wheelchair cushion (measurements PT);Wheelchair (measurements PT)    Recommendations for Other Services       Precautions / Restrictions Precautions Precautions: Fall Precaution Comments: CRRT, Trach, both feet are painful PTA per friend.      Mobility  Bed Mobility               General bed mobility comments: unable, on CRRT    Transfers                    Ambulation/Gait                Stairs            Wheelchair Mobility    Modified Rankin (Stroke Patients Only)       Balance                                             Pertinent Vitals/Pain Pain Assessment: Faces Faces Pain Scale: Hurts little more Pain Location: L LE with ROM Pain Descriptors /  Indicators: Grimacing Pain Intervention(s): Monitored during session    Home Living Family/patient expects to be discharged to:: Private residence Living Arrangements: Alone Available Help at Discharge: Family;Available PRN/intermittently Type of Home: Apartment Home Access: Stairs to enter   Entrance Stairs-Number of Steps: 2 Home Layout: One level Home Equipment: Cane - single point Additional Comments: girlfriend works as PCA but tries to be over at [employer] apartment "as much as I can." Plans to have toilet replaced with handicap height toilet    Prior Function Level of Independence: Independent               Hand Dominance   Dominant Hand: Right    Extremity/Trunk Assessment   Upper Extremity Assessment Upper Extremity Assessment: RUE deficits/detail;LUE deficits/detail RUE Deficits / Details: grip strength 3/5, wrist flexion 3/5, elbow flexion 2+/5, shoulder abduction 3-/5 RUE:  (unable to fully assess 2* CRRT/lines R side of neck) RUE Sensation:  (denies parasthesia) RUE Coordination: decreased fine motor;decreased gross motor LUE Deficits / Details: grip strength 3-/5, wrist flexion 3/5, elbow flexion 2+/5, shoulder abduction 2/5 LUE:  (Unable fully assess 2* multiple lines/trach) LUE Sensation:  (denies parasthesia) LUE Coordination: decreased  fine motor;decreased gross motor    Lower Extremity Assessment Lower Extremity Assessment: LLE deficits/detail;Difficult to assess due to impaired cognition;RLE deficits/detail RLE Deficits / Details: trace toe and ankle, trance hip flex, abd/add knee extension, limited ankle  ROM, H/O ganglion cyst surgery LLE Deficits / Details: same as right.    Cervical / Trunk Assessment Cervical / Trunk Assessment: Other exceptions Cervical / Trunk Exceptions: unable to test, bed bound currently  Communication   Communication: Tracheostomy  Cognition Arousal/Alertness: Awake/alert Behavior During Therapy: Flat  affect Overall Cognitive Status: Difficult to assess Patient mouthed Hospital when asked, nods to answer questions about pain and sensation of extremities.                                 General Comments: does follow 1 step directions for exercises consistently with approximately 10% error      General Comments      Exercises General Exercises - Lower Extremity Ankle Circles/Pumps: PROM;Both;10 reps Short Arc QuadSinclair Hunter;Both;10 reps Heel Slides: AAROM;Both;10 reps   Assessment/Plan    PT Assessment Patient needs continued PT services  PT Problem List Decreased strength;Decreased balance;Decreased knowledge of precautions;Pain;Decreased mobility;Obesity;Decreased activity tolerance;Decreased safety awareness;Decreased coordination       PT Treatment Interventions Therapeutic activities;Patient/family education;Therapeutic exercise;Functional mobility training;Neuromuscular re-education;Balance training    PT Goals (Current goals can be found in the Care Plan section)  Acute Rehab PT Goals Patient Stated Goal: to get stronger PT Goal Formulation: With patient/family Time For Goal Achievement: 04/01/21 Potential to Achieve Goals: Fair    Frequency Min 2X/week   Barriers to discharge        Co-evaluation               AM-PAC PT "6 Clicks" Mobility  Outcome Measure Help needed turning from your back to your side while in a flat bed without using bedrails?: Total Help needed moving from lying on your back to sitting on the side of a flat bed without using bedrails?: Total Help needed moving to and from a bed to a chair (including a wheelchair)?: Total Help needed standing up from a chair using your arms (e.g., wheelchair or bedside chair)?: Total Help needed to walk in hospital room?: Total Help needed climbing 3-5 steps with a railing? : Total 6 Click Score: 6    End of Session   Activity Tolerance: Patient limited by fatigue;Treatment limited  secondary to medical complications (Comment) Patient left: in bed;with call bell/phone within reach;with family/visitor present Nurse Communication: Mobility status;Need for lift equipment PT Visit Diagnosis: Muscle weakness (generalized) (M62.81)    Time: 1150-1210 PT Time Calculation (min) (ACUTE ONLY): 20 min   Charges:   PT Evaluation $PT Eval Moderate Complexity: [address] Pager [phone] Office [phone]   Claretha Cooper 03/18/2021, 1:45 PM

## 2021-03-18 NOTE — Progress Notes (Signed)
Assisted tele visit to patient with family member.  Dhriti Fales DNP elink RN

## 2021-03-18 NOTE — Progress Notes (Signed)
NAME:  Bruce Hunter, MRN:  [id number], DOB:  22-Dec-1975, LOS: 63 ADMISSION DATE:  03/04/2021, CONSULTATION DATE:  6/30 REFERRING MD:  Melina Copa, CHIEF COMPLAINT:  Dyspnea   History of Present Illness:  45 y/o male with HIV/AIDS admitted with acute hypoxemic respiratory failure due to MSSA and presumed PJP pneumonia.    Pertinent  Medical History  Asthma DM2 Gout HTN Obesity GERD AKI Neuropathy  Significant Hospital Events: Including procedures, antibiotic start and stop dates in addition to other pertinent events   6/30 admitted for PNA with known HIV/AIDS 7/1 ETT >>>7/13 Echo 7/1 mild  RA dilation with RV low nl fxn mild PAS elevation and nl LV 7/1 - renal consult, ID consult 7/2 HD catheter >>> 7/5, Fi O2 50%, Pneumocystis DFA negative 03/06/21 ([facility]), MSSA on LRCx. Superficial LUE clot 7/6 - ETT tube advancec by CRNA 7/7 - CRRT stopped 7/8 - Expanding antibiotics to include CNS coverage including acyclovir.  7/9 LP. Febrile to 101 Fahrenheit.  White count 17.5 thousand and slightly better.  1PRBC. CRRT restarted. LP -opening pressure 38 cmH2O, cervical pressure 32 cm, clear colorless CSF, 91 mg percent glucose 0 RBC, 1 WBC, protein 30 mg/dL 7/10 continues on CRRT, MV, pressors 7/11 plt downtrending. Cont on MV CRRT NE. Weaning sedation, trial of vent wean x2 7/12 getting 1 PRBC. BAL -- sending for cx, fungal, PCP 7/13 tracheostomy BAL neg for PCP ag   Interim History / Subjective:  Trach yesterday More awake this morning Remains on CRRT  Objective   Blood pressure (!) 160/88, pulse 88, temperature 98.5 F (36.9 C), temperature source Axillary, resp. rate (!) 29, height 5' 10.98" (1.803 m), weight 93.8 kg, SpO2 96 %.    Vent Mode: PSV;CPAP FiO2 (%):  [30 %-40 %] 30 % Set Rate:  [20 bmp] 20 bmp Vt Set:  [550 mL] 550 mL PEEP:  [8 cmH20] 8 cmH20 Pressure Support:  [10 cmH20] 10 cmH20 Plateau Pressure:  [0 CMK34-91 cmH20] 23 cmH20   Intake/Output Summary (Last 24  hours) at 03/18/2021 0806 Last data filed at 03/18/2021 0700 Gross per 24 hour  Intake 2460.69 ml  Output 6914 ml  Net -4453.31 ml   Filed Weights   03/16/21 0500 03/17/21 0500 03/18/21 0500  Weight: 100.4 kg 99.2 kg 93.8 kg    Examination:  General:  In bed on vent HENT: NCAT tracheostomy in place PULM: CTA B, vent supported breathing CV: RRR, no mgr GI: BS+, soft, nontender MSK: normal bulk and tone Neuro: awake, following commands  CXR images personally reviewed > tracheostomy in place RUL infiltrate improved compared to prior  BAL from 7/12 > neg DFA for PCP, PCR pending   Resolved Hospital Problem list     Assessment & Plan:  Acute metabolic encephalopathy: over sedated at this point Had very high sedation needs earlier in the admission Chronic pain Anxiety Substance abuse disorder Stop fentanyl infusion, change to PRN Wean down precedex for now RASS target 0 to -1 Continue clonazepam and oxycodone today, plan to stop tomorrow  Acute respiratory failure with hypoxemia due to MSSA pneumonia and presumed PCP: improving S/p tracheostomy Pressure support as long as possible today Trach care per routine VAP prevention Continue cefazolin Will decrease solumedrol Primaquin per ID, follow up PJP PCR (pending)  Severe sepsis due to multifocal pneumonia > improved hemodynamics Antibiotics as above  HIV/AIDS Continue antiretrovirals as ordered  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 due to ATN, requiring CRRT; per renal will likely  not recover without intermittent HD at least for the next few weeks Plan hold CRRT in next 24 hours and monitor UOP Will need to move to [facility] in next 48-72 hours for intermittent HD  Anemia Thrombocytopenia Monitor for bleeding Transfuse PRBC for Hgb < 7 gm/dL    Best Practice (right click and "Reselect all SmartList Selections" daily)   Diet/type: tubefeeds DVT prophylaxis: systemic heparin GI prophylaxis: N/A Lines: Dialysis  Catheter Foley:  N/A Code Status:  full code Last date of multidisciplinary goals of care discussion [7/12: family desires full scope of practice, tracheostomy]  Labs   CBC: Recent Labs  Lab 03/14/21 0514 03/15/21 0000 03/15/21 1220 03/16/21 0534 03/16/21 1300 03/17/21 0500 03/18/21 0254  WBC 13.1* 14.1*  --  11.0*  --  14.8* 15.5*  NEUTROABS  --  10.2*  --   --   --   --   --   HGB 7.5* 7.1*  --  6.5* 7.3* 7.4* 7.4*  HCT 25.6* 24.2*  --  22.5* 24.8* 25.4* 25.8*  MCV 98.1 97.6  --  98.3  --  96.9 98.5  PLT 180 133* 127* 157  --  201 119    Basic Metabolic Panel: Recent Labs  Lab 03/14/21 0514 03/14/21 1625 03/15/21 0453 03/15/21 1615 03/16/21 0534 03/16/21 1628 03/17/21 0500 03/17/21 1600 03/18/21 0254  NA 135  135   < > 136   < > 138 138 137 137 139  K 4.3  4.3   < > 4.5   < > 5.1 4.8 4.5 4.5 4.2  CL 102  102   < > 103   < > 104 102 104 102 104  CO2 25  25   < > 26   < > _0 GLUCOSE 182*  183*   < > 151*   < > 196* 238* 172* 175* 214*  BUN 70*  71*   < > 41*   < > 39* 37* 36* 42* 42*  CREATININE 2.80*  2.76*   < > 1.86*  1.82*   < > 1.65* 1.70* 1.60* 1.78* 1.67*  CALCIUM 8.0*  8.1*   < > 7.7*   < > 8.4* 8.5* 8.5* 8.6* 8.8*  MG 2.1  --  2.2  --  2.4  --  2.4  --  2.4  PHOS 6.8*   < > 3.0   < > 2.3* 2.3* 2.3* 3.3 3.1   < > = values in this interval not displayed.   GFR: Estimated Creatinine Clearance: 66 mL/min (A) (by C-G formula based on SCr of 1.67 mg/dL (H)). Recent Labs  Lab 03/13/21 0914 03/13/21 0915 03/13/21 0943 03/14/21 0514 03/15/21 0000 03/15/21 0453 03/16/21 0534 03/17/21 0500 03/18/21 0254  PROCALCITON  --  3.86  --  4.80  --  5.51  --   --   --   WBC  --   --    < > 13.1* 14.1*  --  11.0* 14.8* 15.5*  LATICACIDVEN 1.2  --   --   --   --  1.0  --   --   --    < > = values in this interval not displayed.    Liver Function Tests: Recent Labs  Lab 03/13/21 1519 03/13/21 1730 03/14/21 0514 03/14/21 1625  03/15/21 1116 03/15/21 1615 03/16/21 0534 03/16/21 1628 03/17/21 0500 03/17/21 1600 03/18/21 0254  AST 26  --  24  --  30  --   --   --   --   --   --  ALT 6  --  5  --  7  --   --   --   --   --   --   ALKPHOS 91  --  91  --  144*  --   --   --   --   --   --   BILITOT 0.6  --  0.6  --  0.4  --   --   --   --   --   --   PROT 6.7  --  6.5  --  6.7  --   --   --   --   --   --   ALBUMIN 1.8*  1.8*   < > 1.8*  1.8*   < > 1.7*   < > 1.7* 1.7* 1.7* 1.8* 1.9*   < > = values in this interval not displayed.   No results for input(s): LIPASE, AMYLASE in the last 168 hours. No results for input(s): AMMONIA in the last 168 hours.  ABG    Component Value Date/Time   PHART 7.274 (L) 03/13/2021 1203   PCO2ART 46.1 03/13/2021 1203   PO2ART 96.8 03/13/2021 1203   HCO3 20.7 03/13/2021 1203   TCO2 27 02/26/2020 0635   ACIDBASEDEF 5.2 (H) 03/13/2021 1203   O2SAT 96.3 03/13/2021 1203     Coagulation Profile: Recent Labs  Lab 03/13/21 0943 03/15/21 1220  INR 1.1 1.0    Cardiac Enzymes: Recent Labs  Lab 03/13/21 0914  CKTOTAL 266  CKMB 2.1    HbA1C: Hgb A1c MFr Bld  Date/Time Value Ref Range Status  03/06/2021 10:31 AM 5.4 4.8 - 5.6 % Final    Comment:    (NOTE)         Prediabetes: 5.7 - 6.4         Diabetes: >6.4         Glycemic control for adults with diabetes: <7.0   02/12/2021 03:22 AM 5.9 (H) 4.8 - 5.6 % Final    Comment:    (NOTE)         Prediabetes: 5.7 - 6.4         Diabetes: >6.4         Glycemic control for adults with diabetes: <7.0     CBG: Recent Labs  Lab 03/17/21 1543 03/17/21 1957 03/17/21 2330 03/18/21 0332 03/18/21 0726  GLUCAP 169* 167* 186* 192* 228*    Critical care time: 35 minutes    Roselie Awkward, MD [facility] Pager: [phone] Cell: [phone] After 7:00 pm call Elink  [phone]

## 2021-03-18 NOTE — Progress Notes (Signed)
[place] visited briefly today while rounding in ICU; pt. lying in bed appearing awake and undistressed, w/uncle at bedside.  Pt. seemed to want to write something down in order to communicate w/[facility] but uncle shared that staff told him pt.'s handwriting has not been legible due to meds he is taking.  Uncle appreciated visit but shares no immediate needs from [facility] at this time.  Lindaann Pascal PRN Chaplain Pager: [phone]

## 2021-03-18 NOTE — Progress Notes (Signed)
Occupational Therapy Evaluation  Patient lives alone in an apartment, has girlfriend that is over often when not working. Apartment has 2 steps to enter and is one level. At baseline patient independent with self care, does not use adaptive devices. Currently patient presents with significant weakness, decreased coordination, activity tolerance s/p intubation after ~2 weeks. Girlfriend present for part of evaluation and asking if she she can perform exercises with patient she observed with OT/PT and encouraged. Patient following directions well for AAROM exercises with ~10% error. Patient will need extensive rehabilitation to progress towards functional independence, acute OT to follow.     03/18/21 1400  OT Visit Information  Last OT Received On 03/18/21  Assistance Needed +2 (for mobility)  PT/OT/SLP Co-Evaluation/Treatment Yes  Reason for Co-Treatment Complexity of the patient's impairments (multi-system involvement);For patient/therapist safety;To address functional/ADL transfers  OT goals addressed during session Strengthening/ROM;ADL's and self-care  History of Present Illness Bruce Hunter is a/an 45 y.o. male with a past medical history significant for HIV/AIDS, CKD, asthma, obesity, DM2 admitted for acute hypoxic respiratory failure with sepsis, requiring ventilator support, CRRT, S/P tracheostomy 03/17/21.  Precautions  Precautions Fall  Precaution Comments CRRT, Trach, both feet are painful PTA per friend.  Home Living  Family/patient expects to be discharged to: Private residence  Living Arrangements Alone  Available Help at Discharge Family;Available PRN/intermittently  Type of Home Apartment  Home Access Stairs to enter  Entrance Stairs-Number of Steps 2  Home Layout One level  Bathroom Radio broadcast assistant - single point  Additional Comments girlfriend works as PCA but tries to be over at [employer] apartment "as much as I  can." Plans to have toilet replaced with handicap height toilet  Prior Function  Level of Independence Independent  Communication  Communication Tracheostomy  Pain Assessment  Pain Assessment Faces  Faces Pain Scale 4  Pain Location L LE with ROM  Pain Descriptors / Indicators Grimacing  Pain Intervention(s) Monitored during session;Limited activity within patient's tolerance  Cognition  Arousal/Alertness Awake/alert  Behavior During Therapy Flat affect  Overall Cognitive Status Difficult to assess  General Comments does follow 1 step directions for exercises consistently with approximately 10% error  Difficult to assess due to Tracheostomy  Upper Extremity Assessment  Upper Extremity Assessment RUE deficits/detail;LUE deficits/detail  RUE Deficits / Details grip strength 3/5, wrist flexion 3/5, elbow flexion 2+/5, shoulder abduction 3-/5  RUE Sensation  (denies parasthesia)  RUE Coordination decreased fine motor;decreased gross motor  LUE Deficits / Details grip strength 3-/5, wrist flexion 3/5, elbow flexion 2+/5, shoulder abduction 2/5  LUE Sensation  (denies parasthesia)  LUE Coordination decreased fine motor;decreased gross motor  Lower Extremity Assessment  Lower Extremity Assessment Defer to PT evaluation  Cervical / Trunk Assessment  Cervical / Trunk Assessment Other exceptions  Cervical / Trunk Exceptions unable to test, bed bound currently  ADL  Overall ADL's  Needs assistance/impaired  Eating/Feeding NPO  Grooming Total assistance  Upper Body Bathing Total assistance  Lower Body Bathing Total assistance  Upper Body Dressing  Total assistance  Lower Body Dressing Total assistance  Toilet Transfer Details (indicate cue type and reason) bed bound currently  Toileting- Clothing Manipulation and Hygiene Total assistance  Functional mobility during ADLs Total assistance  General ADL Comments patient with significant weakness, has been intubated ~2 weeks currently  requiring total care  Bed Mobility  General bed mobility comments unable, on CRRT  Exercises  Exercises General Upper Extremity  General Exercises - Upper Extremity  Shoulder ABduction AAROM;Both;5 reps;Supine  Elbow Flexion AAROM;Both;5 reps;Supine  Wrist Flexion AAROM;Both;5 reps;Supine  Composite Extension AROM;Both;5 reps;Supine  OT - End of Session  Activity Tolerance Patient tolerated treatment well  Patient left in bed;with call bell/phone within reach;with family/visitor present  Nurse Communication Mobility status  OT Assessment  OT Recommendation/Assessment Patient needs continued OT Services  OT Visit Diagnosis Other abnormalities of gait and mobility (R26.89);Muscle weakness (generalized) (M62.81);Pain  Pain - Right/Left Left  Pain - part of body Leg;Ankle and joints of foot  OT Problem List Decreased strength;Decreased range of motion;Decreased activity tolerance;Decreased safety awareness;Impaired UE functional use;Pain;Impaired balance (sitting and/or standing);Decreased coordination  OT Plan  OT Frequency (ACUTE ONLY) Min 2X/week  OT Treatment/Interventions (ACUTE ONLY) Self-care/ADL training;Therapeutic exercise;Balance training;Patient/family education;Cognitive remediation/compensation;Therapeutic activities;DME and/or AE instruction  AM-PAC OT "6 Clicks" Daily Activity Outcome Measure (Version 2)  Help from another person eating meals? 1  Help from another person taking care of personal grooming? 1  Help from another person toileting, which includes using toliet, bedpan, or urinal? 1  Help from another person bathing (including washing, rinsing, drying)? 1  Help from another person to put on and taking off regular upper body clothing? 1  Help from another person to put on and taking off regular lower body clothing? 1  6 Click Score 6  Progressive Mobility  What is the highest level of mobility based on the progressive mobility assessment? Level 1 (Bedfast) - Unable  to balance while sitting on edge of bed  Mobility Sit up in bed/chair position for meals  OT Recommendation  Follow Up Recommendations LTACH;SNF  OT Equipment Tub/shower seat  Individuals Consulted  Consulted and Agree with Results and Recommendations Family member/caregiver  Family Member Consulted s/o  Acute Rehab OT Goals  Patient Stated Goal to get stronger  OT Goal Formulation With family  Time For Goal Achievement 04/01/21  Potential to Achieve Goals Good  OT Time Calculation  OT Start Time (ACUTE ONLY) 1148  OT Stop Time (ACUTE ONLY) 1208  OT Time Calculation (min) 20 min  OT General Charges  $OT Visit 1 Visit  OT Evaluation  $OT Eval Moderate Complexity 1 Mod  Written Expression  Dominant Hand Right   Delbert Phenix OT OT pager: [phone]

## 2021-03-18 NOTE — Progress Notes (Signed)
Nephrology Follow-Up Consult note   Assessment/Recommendations: Bruce Hunter is a/an 45 y.o. male with a past medical history significant for HIV/AIDS, CKD, asthma, obesity, DM2 admitted for acute hypoxic respiratory failure with sepsis.      Subjective:  remains off pressors, BP's on the high side, large UF yest 3.2 L net negative and 2.3 net negative today so far. HR 80- 100 and BP 140- 170 / 79- 90  Assessment/ Plan Non-Oliguric AKI on CKD3b: Likely secondary to ATN secondary to severe sepsis.  CKD with baseline of 2.5-3 (thus expected to rise at least to same).  Did have some urinary retention so Foley catheter placed. CRRT started on 7/02, held on 7/7 and 7/8 and resumed on 7/09. Continue CRRT another 24 hrs or so.  Would prefer transfer to [facility] in the next day or two for transition to iHD, have d/w CCM.   BP/volume - peak wt was 11 kg up, now down to 3 kg up from admit wt.  BP's high now , off pressors > 24 hrs. Cont to UF through the day and overnight, lower UF goal if any sign of dehydration.   Multifocal PNA - ID suspects PCP + secondary MSSA infection. Sp bronch today. May need trach. CXR's most cleared w/ some residual RUL infiltrates  Septic shock: resolved  AHRF: sp trach, looks better, but per RN very weak and not weaning much yet  HIV: ID managing  Hypertension: BP's up, no strong indication for BP meds yet given recent shock on pressors. Will follow.   DM2: Insulin management per primary  Anemia macrocytic. Likely multifactorial with infection contributing. Transfuse PRBC's prn. Started darbe 40 ug per wk.   Kelly Splinter, MD 03/18/2021, 10:08 AM       Medications:  Current Facility-Administered Medications  Medication Dose Route Frequency Provider Last Rate Last Admin    prismasol BGK 4/2.5 infusion   CRRT Continuous Roney Jaffe, MD 400 mL/hr at 03/18/21 0419 New Bag at 03/18/21 0419    prismasol BGK 4/2.5 infusion   CRRT Continuous Roney Jaffe, MD 200  mL/hr at 03/18/21 0111 New Bag at 03/18/21 0111   0.9 %  sodium chloride infusion (Manually program via Guardrails IV Fluids)   Intravenous Once Brand Males, MD       0.9 %  sodium chloride infusion  250 mL Intravenous Continuous Candee Furbish, MD 10 mL/hr at 03/10/21 0111 Infusion Verify at 03/10/21 0111   0.9 %  sodium chloride infusion  250 mL Intravenous Continuous Anders Simmonds, MD 10 mL/hr at 03/18/21 0900 Infusion Verify at 03/18/21 0900   acetaminophen (TYLENOL) 160 MG/5ML solution 650 mg  650 mg Per Tube Q6H PRN Tanda Rockers, MD   650 mg at 03/13/21 1656   albuterol (PROVENTIL) (2.5 MG/3ML) 0.083% nebulizer solution 2.5 mg  2.5 mg Nebulization Q2H PRN Collier Bullock, MD       alteplase (CATHFLO ACTIVASE) injection 2 mg  2 mg Intracatheter Once Tanda Rockers, MD       alteplase (CATHFLO ACTIVASE) injection 2 mg  2 mg Intracatheter Once Roney Jaffe, MD       ceFAZolin (ANCEF) 2 g in sodium chloride 0.9 % 100 mL IVPB  2 g Intravenous Q12H Polly Cobia, RPH   2 g at 03/18/21 0905   chlorhexidine gluconate (MEDLINE KIT) (PERIDEX) 0.12 % solution 15 mL  15 mL Mouth Rinse BID Candee Furbish, MD   15 mL at 03/18/21 7829   Chlorhexidine  Gluconate Cloth 2 % PADS 6 each  6 each Topical Daily Brand Males, MD   6 each at 03/18/21 0515   clindamycin (CLEOCIN) IVPB 900 mg  900 mg Intravenous Q8H Carlyle Basques, MD   Stopped at 03/18/21 0544   clonazePAM (KLONOPIN) tablet 1 mg  1 mg Per Tube BID Hunsucker, Bonna Gains, MD   1 mg at 03/18/21 0912   Darbepoetin Alfa (ARANESP) injection 40 mcg  40 mcg Subcutaneous Q Sun-1800 Claudia Desanctis, MD   40 mcg at 03/14/21 1817   dexmedetomidine (PRECEDEX) 400 MCG/100ML (4 mcg/mL) infusion  0.4-1.2 mcg/kg/hr Intravenous Titrated Simonne Maffucci B, MD 28.5 mL/hr at 03/18/21 0932 1.2 mcg/kg/hr at 03/18/21 0932   docusate (COLACE) 50 MG/5ML liquid 100 mg  100 mg Per Tube BID PRN Tanda Rockers, MD   100 mg at 03/08/21 1358   dolutegravir  (TIVICAY) tablet 50 mg  50 mg Per Tube Daily Tanda Rockers, MD   50 mg at 03/17/21 0913   emtricitabine-tenofovir AF (DESCOVY) 200-25 MG per tablet 1 tablet  1 tablet Per Tube Daily Carlyle Basques, MD   1 tablet at 03/18/21 0913   feeding supplement (PIVOT 1.5 CAL) liquid 1,000 mL  1,000 mL Per Tube Q24H Juanito Doom, MD   Stopped at 03/18/21 0901   feeding supplement (PROSource TF) liquid 90 mL  90 mL Per Tube QID Simonne Maffucci B, MD   90 mL at 03/18/21 0811   fentaNYL (SUBLIMAZE) injection 25-100 mcg  25-100 mcg Intravenous Q2H PRN Simonne Maffucci B, MD       fluconazole (DIFLUCAN) IVPB 400 mg  400 mg Intravenous Q24H Carlyle Basques, MD   Stopped at 03/18/21 0109   gabapentin (NEURONTIN) 250 MG/5ML solution 100 mg  100 mg Per Tube Q12H Bowser, Laurel Dimmer, NP   100 mg at 03/18/21 0913   heparin 10,000 units/ 20 mL infusion syringe  250-3,000 Units/hr CRRT Continuous Gean Quint, MD 3 mL/hr at 03/18/21 0854 1,500 Units/hr at 03/18/21 0854   heparin bolus via infusion syringe 1,000 Units  1,000 Units CRRT PRN Gean Quint, MD   1,000 Units at 03/13/21 2328   heparin injection 1,000-6,000 Units  1,000-6,000 Units CRRT PRN Claudia Desanctis, MD       heparin injection 5,000 Units  5,000 Units Subcutaneous Q8H Simonne Maffucci B, MD       insulin aspart (novoLOG) injection 0-15 Units  0-15 Units Subcutaneous Q4H Ollis, Brandi L, NP   5 Units at 03/18/21 0810   ipratropium-albuterol (DUONEB) 0.5-2.5 (3) MG/3ML nebulizer solution 3 mL  3 mL Nebulization Q4H Tyna Jaksch, MD   3 mL at 03/18/21 0740   MEDLINE mouth rinse  15 mL Mouth Rinse 10 times per day Candee Furbish, MD   15 mL at 03/18/21 0912   [START ON 03/19/2021] methylPREDNISolone sodium succinate (SOLU-MEDROL) 40 mg/mL injection 30 mg  30 mg Intravenous Daily Simonne Maffucci B, MD       metoprolol tartrate (LOPRESSOR) injection 2.5-5 mg  2.5-5 mg Intravenous Q3H PRN Bowser, Laurel Dimmer, NP       midazolam (VERSED) injection 1-2 mg   1-2 mg Intravenous Q2H PRN Cristal Generous, NP   2 mg at 03/17/21 0856   norepinephrine (LEVOPHED) 16 mg in 225m premix infusion  0-40 mcg/min Intravenous Titrated GMinda Ditto RPH   Stopped at 03/16/21 1108   ondansetron (ZOFRAN) injection 4 mg  4 mg Intravenous Q6H PRN GCollier Bullock MD  oxyCODONE (ROXICODONE) 5 MG/5ML solution 5 mg  5 mg Per Tube Q6H Hoffman, Paul W, NP   5 mg at 03/18/21 0913   pantoprazole sodium (PROTONIX) 40 mg/20 mL oral suspension 40 mg  40 mg Per Tube QHS Bell, Michelle T, RPH   40 mg at 03/17/21 2113   polyethylene glycol (MIRALAX / GLYCOLAX) packet 17 g  17 g Per Tube Daily PRN Wert, Michael B, MD       primaquine tablet 30 mg  30 mg Per Tube Daily McQuaid, Douglas B, MD   30 mg at 03/18/21 0913   prismasol BGK 4/2.5 infusion   CRRT Continuous , , MD 1,600 mL/hr at 03/18/21 0201 New Bag at 03/18/21 0201   sodium chloride flush (NS) 0.9 % injection 10-40 mL  10-40 mL Intracatheter Q12H Wert, Michael B, MD   10 mL at 03/18/21 0913   sodium chloride flush (NS) 0.9 % injection 10-40 mL  10-40 mL Intracatheter PRN Wert, Michael B, MD         Physical Exam: Vitals:   03/18/21 0800 03/18/21 0830  BP: (!) 161/94 (!) 169/98  Pulse: 100 92  Resp: (!) 33 (!) 33  Temp: 99.2 F (37.3 C)   SpO2: 98% 97%   Total I/O In: 313.8 [I.V.:83.8; NG/GT:230] Out: 550 [Other:550]  Intake/Output Summary (Last 24 hours) at 03/18/2021 1008 Last data filed at 03/18/2021 0901 Gross per 24 hour  Intake 2361.64 ml  Output 6964 ml  Net -4602.36 ml    General adult male on vent w/ trach   HEENT normocephalic atraumatic  Neck supple trachea midline, trach+ Lungs coarse mechanical breath sounds  Heart S1S2 no rub Abdomen soft nontender with limits of sedation distended Extremities 1+ improved edema Neuro sedation running Access: RIJ nontunneled catheter in place   Test Results I personally reviewed new and old clinical labs and radiology tests Lab Results   Component Value Date   NA 139 03/18/2021   K 4.2 03/18/2021   CL 104 03/18/2021   CO2 28 03/18/2021   BUN 42 (H) 03/18/2021   CREATININE 1.67 (H) 03/18/2021   CALCIUM 8.8 (L) 03/18/2021   ALBUMIN 1.9 (L) 03/18/2021   PHOS 3.1 03/18/2021      D , MD 03/18/2021 10:08 AM     

## 2021-03-18 NOTE — Progress Notes (Signed)
[facility] for Infectious Disease    Date of Admission:  03/04/2021   Total days of antibiotics 15          ID: DJ SENTENO is a 45 y.o. 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 (HCC)   Elevated LFTs   Septic shock (HCC)   Pressure injury of skin   Acute metabolic encephalopathy   Severe sepsis (HCC)    Subjective: Remains vented, still some secretions FIO2 stable, trached yesterday ,afebrile, more alert  Medications:   sodium chloride   Intravenous Once   alteplase  2 mg Intracatheter Once   alteplase  2 mg Intracatheter Once   chlorhexidine gluconate (MEDLINE KIT)  15 mL Mouth Rinse BID   Chlorhexidine Gluconate Cloth  6 each Topical Daily   clonazePAM  1 mg Per Tube BID   darbepoetin (ARANESP) injection - NON-DIALYSIS  40 mcg Subcutaneous Q Sun-1800   dolutegravir  50 mg Per Tube Daily   emtricitabine-tenofovir AF  1 tablet Per Tube Daily   feeding supplement (PIVOT 1.5 CAL)  1,000 mL Per Tube Q24H   feeding supplement (PROSource TF)  90 mL Per Tube QID   gabapentin  100 mg Per Tube Q12H   heparin injection (subcutaneous)  5,000 Units Subcutaneous Q8H   insulin aspart  0-15 Units Subcutaneous Q4H   ipratropium-albuterol  3 mL Nebulization Q4H   linezolid  600 mg Per Tube Q12H   mouth rinse  15 mL Mouth Rinse 10 times per day   [START ON 03/19/2021] methylPREDNISolone (SOLU-MEDROL) injection  30 mg Intravenous Daily   oxyCODONE  5 mg Per Tube Q6H   pantoprazole sodium  40 mg Per Tube QHS   primaquine  30 mg Per Tube Daily   sodium chloride flush  10-40 mL Intracatheter Q12H    Objective: Vital signs in last 24 hours: Temp:  [98.5 F (36.9  C)-99.6 F (37.6 C)] 99.6 F (37.6 C) (07/14 1600) Pulse Rate:  [74-100] 92 (07/14 1630) Resp:  [19-40] 23 (07/14 1630) BP: (126-172)/(74-98) 127/76 (07/14 1630) SpO2:  [87 %-100 %] 99 % (07/14 1630) FiO2 (%):  [30 %-40 %] 30 % (07/14 1647) Weight:  [93.8 kg] 93.8 kg (07/14 0500) Physical Exam  Constitutional: eyes open to verbal stimuli. He appears well-developed and well-nourished. No distress.  HENT:  Mouth/Throat: trach placed. Cardiovascular: Normal rate, regular rhythm and normal heart sounds. Exam reveals no gallop and no friction rub.  No murmur heard.  Pulmonary/Chest: Effort normal and breath sounds normal. No respiratory distress. He has no wheezes.  Abdominal: Soft. Bowel sounds are slow He exhibits no distension. There is no tenderness.  Ext: +1 pittien edema Skin: Skin is warm and dry. No rash noted. No erythema.      Lab Results Recent Labs    03/17/21 0500 03/17/21 1600 03/18/21 0254 03/18/21 1600  WBC 14.8*  --  15.5*  --   HGB 7.4*  --  7.4*  --   HCT 25.4*  --  25.8*  --   NA 137   < > 139 135  K 4.5   < > 4.2 4.4  CL 104   < > 104  102  CO2 29   < > 28 26  BUN 36*   < > 42* 43*  CREATININE 1.60*   < > 1.67* 1.69*   < > = values in this interval not displayed.   Liver Panel Recent Labs    03/18/21 0254 03/18/21 1600  ALBUMIN 1.9* 2.1*   Sedimentation Rate No results for input(s): ESRSEDRATE in the last 72 hours. C-Reactive Protein No results for input(s): CRP in the last 72 hours.  Microbiology: reviewed Studies/Results: DG Abd 1 View  Result Date: 03/17/2021 CLINICAL DATA:  Feeding tube placement. EXAM: ABDOMEN - 1 VIEW COMPARISON:  Radiographs 03/12/2021 and 03/06/2021.  CT 03/13/2021. FINDINGS: 1513 hours. A feeding tube has been placed, projecting to the level of the proximal duodenum. The visualized bowel gas pattern is normal. Bilateral pulmonary airspace opacities are similar to recent chest radiographs. IMPRESSION: Feeding tube  projects to the level of the proximal duodenum. Electronically Signed   By: Richardean Sale M.D.   On: 03/17/2021 15:49   DG Chest Port 1 View  Result Date: 03/17/2021 CLINICAL DATA:  Feeding tube placement EXAM: PORTABLE CHEST 1 VIEW COMPARISON:  03/15/2021 FINDINGS: Enteric tube passes below the diaphragm and out of field of view. Right IJ central line is again noted. Endotracheal tube is no longer present with a tracheostomy device now noted. Persistent right greater than left perihilar and left lower lung opacities. No significant pleural effusion. No pneumothorax. IMPRESSION: Enteric tube passes into the stomach with tip out of field of view. Tracheostomy device is now present. Persistent bilateral pulmonary opacities. Electronically Signed   By: Macy Mis M.D.   On: 03/17/2021 15:50     Assessment/Plan: HIV disease= continue on tivicay and descovy. This will be his new regimen. Responding well. CD 4 count of 324/VL 240 down from VL of 2.89M roughly 5 wks ago. Has been on treatment nearly 1 month. Continue per ng  Probably PCP pneumonia = continue on primaquine and clindamycin for now as well as steroids  Secondary bacterial infection = initially MSSA isolated from trach aspirate, treated 14d. Now has MRSE noted on bronch from 7/12. Will do course of linezolid.(Spare kidneys from vanco)  AKI on CRRT = getting switched to intermittent HD, prior to hospitalization had normal urine output. In part multifactorial with ATN, and HIV disease. Would recommend HD catheter placement rather than surgery for AVG. May improve as hiv and atn improve.  Eso candidiasis = completed 14d of fluconazole.    [facility] Cell: [phone] Pager: [phone]  03/18/2021, 5:18 PM

## 2021-03-18 NOTE — Evaluation (Signed)
SLP Cancellation Note  Patient Details Name: Bruce Hunter MRN: [id number] DOB: 09-Sep-1975   Cancelled treatment:       Reason Eval/Treat Not Completed: Other (comment);Medical issues which prohibited therapy (Pt remains on vent.  Per MD notes - pressure support trials today, will continue efforts- If plan is to liberate from vent - will see pt for PMSV/Swallow evaluation when pt is off vent.  Thanks.)  RN reports pt not ready to wean today , will continue efforts.   Bruce Lime, MS [facility] SLP Acute Rehab Services Office [phone] Pager [phone]    Bruce Hunter 03/18/2021, 9:19 AM

## 2021-03-19 ENCOUNTER — Inpatient Hospital Stay (HOSPITAL_COMMUNITY): Payer: Medicaid Other

## 2021-03-19 DIAGNOSIS — J9601 Acute respiratory failure with hypoxia: Secondary | ICD-10-CM | POA: Diagnosis not present

## 2021-03-19 DIAGNOSIS — G9341 Metabolic encephalopathy: Secondary | ICD-10-CM | POA: Diagnosis not present

## 2021-03-19 DIAGNOSIS — Z930 Tracheostomy status: Secondary | ICD-10-CM

## 2021-03-19 DIAGNOSIS — J189 Pneumonia, unspecified organism: Secondary | ICD-10-CM | POA: Diagnosis not present

## 2021-03-19 LAB — CBC WITH DIFFERENTIAL/PLATELET
Abs Immature Granulocytes: 0.96 10*3/uL — ABNORMAL HIGH (ref 0.00–0.07)
Basophils Absolute: 0 10*3/uL (ref 0.0–0.1)
Basophils Relative: 0 %
Eosinophils Absolute: 0.2 10*3/uL (ref 0.0–0.5)
Eosinophils Relative: 1 %
HCT: 24.9 % — ABNORMAL LOW (ref 39.0–52.0)
Hemoglobin: 7.5 g/dL — ABNORMAL LOW (ref 13.0–17.0)
Immature Granulocytes: 5 %
Lymphocytes Relative: 11 %
Lymphs Abs: 2.1 10*3/uL (ref 0.7–4.0)
MCH: 29.5 pg (ref 26.0–34.0)
MCHC: 30.1 g/dL (ref 30.0–36.0)
MCV: 98 fL (ref 80.0–100.0)
Monocytes Absolute: 2 10*3/uL — ABNORMAL HIGH (ref 0.1–1.0)
Monocytes Relative: 10 %
Neutro Abs: 14.2 10*3/uL — ABNORMAL HIGH (ref 1.7–7.7)
Neutrophils Relative %: 73 %
Platelets: 169 10*3/uL (ref 150–400)
RBC: 2.54 MIL/uL — ABNORMAL LOW (ref 4.22–5.81)
RDW: 22.5 % — ABNORMAL HIGH (ref 11.5–15.5)
WBC: 19.4 10*3/uL — ABNORMAL HIGH (ref 4.0–10.5)
nRBC: 0.3 % — ABNORMAL HIGH (ref 0.0–0.2)

## 2021-03-19 LAB — COMPREHENSIVE METABOLIC PANEL
ALT: 13 U/L (ref 0–44)
AST: 41 U/L (ref 15–41)
Albumin: 2.1 g/dL — ABNORMAL LOW (ref 3.5–5.0)
Alkaline Phosphatase: 90 U/L (ref 38–126)
Anion gap: 10 (ref 5–15)
BUN: 45 mg/dL — ABNORMAL HIGH (ref 6–20)
CO2: 26 mmol/L (ref 22–32)
Calcium: 8.9 mg/dL (ref 8.9–10.3)
Chloride: 101 mmol/L (ref 98–111)
Creatinine, Ser: 1.56 mg/dL — ABNORMAL HIGH (ref 0.61–1.24)
GFR, Estimated: 56 mL/min — ABNORMAL LOW (ref >60)
Glucose, Bld: 215 mg/dL — ABNORMAL HIGH (ref 70–99)
Potassium: 3.9 mmol/L (ref 3.5–5.1)
Sodium: 137 mmol/L (ref 135–145)
Total Bilirubin: 0.6 mg/dL (ref 0.3–1.2)
Total Protein: 7.5 g/dL (ref 6.5–8.1)

## 2021-03-19 LAB — GLUCOSE, CAPILLARY
Glucose-Capillary: 172 mg/dL — ABNORMAL HIGH (ref 70–99)
Glucose-Capillary: 192 mg/dL — ABNORMAL HIGH (ref 70–99)
Glucose-Capillary: 204 mg/dL — ABNORMAL HIGH (ref 70–99)
Glucose-Capillary: 211 mg/dL — ABNORMAL HIGH (ref 70–99)
Glucose-Capillary: 213 mg/dL — ABNORMAL HIGH (ref 70–99)
Glucose-Capillary: 265 mg/dL — ABNORMAL HIGH (ref 70–99)

## 2021-03-19 LAB — POCT ACTIVATED CLOTTING TIME
Activated Clotting Time: 184 seconds
Activated Clotting Time: 179 seconds

## 2021-03-19 LAB — APTT: aPTT: 62 seconds — ABNORMAL HIGH (ref 24–36)

## 2021-03-19 LAB — PHOSPHORUS: Phosphorus: 2.2 mg/dL — ABNORMAL LOW (ref 2.5–4.6)

## 2021-03-19 LAB — MAGNESIUM: Magnesium: 2.4 mg/dL (ref 1.7–2.4)

## 2021-03-19 LAB — HSV(HERPES SMPLX VRS)ABS-I+II(IGG)-CSF: HSV Type I/II Ab, IgG CSF: <0.34 IV (ref <=0.89)

## 2021-03-19 IMAGING — DX DG CHEST 1V PORT
1 series · 1 of 1 positions shown · non-contrast
Comparison: [DATE].

CLINICAL DATA: Respiratory failure.

EXAM:
PORTABLE CHEST 1 VIEW

[chest ap]
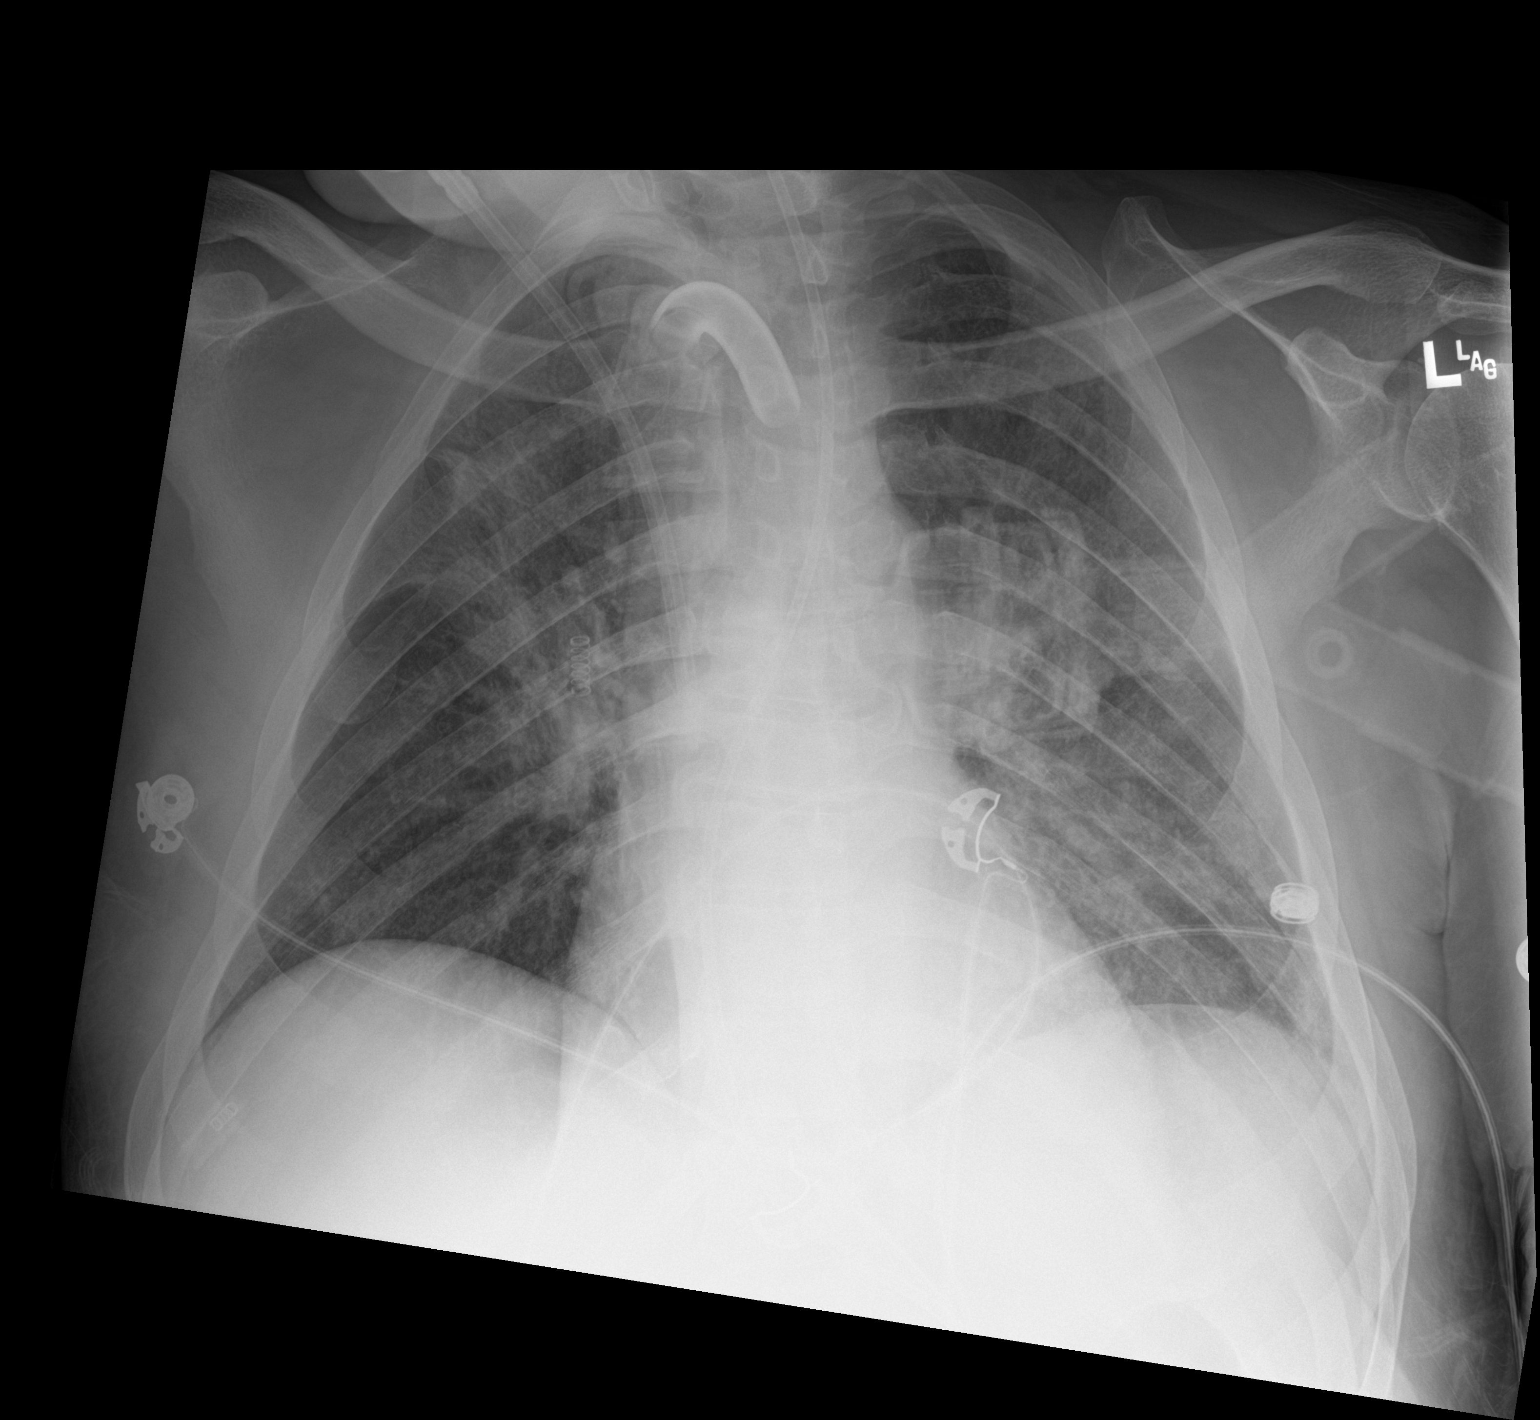

[1 of 1 positions shown; findings below may reference images not displayed]

FINDINGS: Tracheostomy tube tip projects at the tracheal air column at the
level of the clavicular heads, similar to prior. Enteric tube
courses below the diaphragm with the tip outside the field of view.
Right IJ central venous catheter is in similar position. Mild
improvement in right suprahilar opacity with otherwise similar
bilateral interstitial and airspace opacities. No visible
pneumothorax or pleural effusions on this single semi erect AP
radiograph.
IMPRESSION: 1. Mild improvement in the right suprahilar opacities with otherwise
similar bilateral interstitial and airspace opacities.
2. Similar position of support devices, as detailed above.

## 2021-03-19 MED ORDER — PIVOT 1.5 CAL PO LIQD
1000.0000 mL | ORAL | Status: DC
  Administered 2021-03-19 – 2021-03-20 (×2): 1000 mL
  Filled 2021-03-19 (×5): qty 1000

## 2021-03-19 MED ORDER — AMLODIPINE 1 MG/ML ORAL SUSPENSION: 10.0000 mg | Freq: Every day | ORAL | Status: DC

## 2021-03-19 MED ORDER — LABETALOL HCL 5 MG/ML IV SOLN
10.0000 mg | INTRAVENOUS | Status: DC | PRN
  Administered 2021-03-19: 10 mg via INTRAVENOUS
  Administered 2021-03-19 – 2021-03-29 (×3): 20 mg via INTRAVENOUS
  Filled 2021-03-19 (×5): qty 4

## 2021-03-19 MED ORDER — CLONIDINE ORAL SUSPENSION 10 MCG/ML: 0.1000 mg | Freq: Two times a day (BID) | ORAL | Status: DC

## 2021-03-19 MED ORDER — HEPARIN SODIUM (PORCINE) 1000 UNIT/ML DIALYSIS
1000.0000 [IU] | INTRAMUSCULAR | Status: DC | PRN
  Administered 2021-03-19: 2400 [IU] via INTRAVENOUS_CENTRAL
  Filled 2021-03-19 (×2): qty 6

## 2021-03-19 MED ORDER — CLONIDINE HCL 0.2 MG PO TABS
0.1000 mg | ORAL_TABLET | Freq: Two times a day (BID) | ORAL | Status: DC
  Administered 2021-03-19 – 2021-03-21 (×6): 0.1 mg
  Filled 2021-03-19 (×5): qty 1

## 2021-03-19 MED ORDER — CLONIDINE HCL 0.1 MG PO TABS
0.1000 mg | ORAL_TABLET | Freq: Two times a day (BID) | ORAL | Status: DC
  Filled 2021-03-19: qty 1

## 2021-03-19 MED ORDER — CLONAZEPAM 0.5 MG PO TABS
0.5000 mg | ORAL_TABLET | Freq: Two times a day (BID) | ORAL | Status: DC
  Administered 2021-03-19 – 2021-03-22 (×8): 0.5 mg
  Filled 2021-03-19 (×8): qty 1

## 2021-03-19 MED ORDER — INSULIN GLARGINE 100 UNIT/ML ~~LOC~~ SOLN
10.0000 [IU] | Freq: Every day | SUBCUTANEOUS | Status: DC
  Administered 2021-03-19 – 2021-03-21 (×3): 10 [IU] via SUBCUTANEOUS
  Filled 2021-03-19 (×4): qty 0.1

## 2021-03-19 MED ORDER — OXYCODONE HCL 5 MG/5ML PO SOLN
5.0000 mg | Freq: Two times a day (BID) | ORAL | Status: DC
  Administered 2021-03-19 – 2021-03-21 (×5): 5 mg
  Filled 2021-03-19 (×6): qty 5

## 2021-03-19 MED ORDER — AMLODIPINE BESYLATE 10 MG PO TABS
10.0000 mg | ORAL_TABLET | Freq: Every day | ORAL | Status: DC
  Administered 2021-03-19 – 2021-03-22 (×4): 10 mg
  Filled 2021-03-19: qty 1
  Filled 2021-03-19: qty 2
  Filled 2021-03-19 (×2): qty 1

## 2021-03-19 NOTE — Progress Notes (Signed)
Nephrology Follow-Up Consult note   Assessment/Recommendations: Bruce Hunter is a/an 45 y.o. male with a past medical history significant for HIV/AIDS, CKD, asthma, obesity, DM2 admitted for acute hypoxic respiratory failure with sepsis.      Subjective:  remains off pressors, net UF 5 L yesterday.   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  CKD with baseline of 2.5-3 (thus expected to rise at least to same).  Did have some urinary retention so Foley catheter placed. CRRT started on 7/02, held on 7/7 and 7/8 and resumed on 7/09. Will dc CRRT today and plan transfer to [facility] for iHD.   BP/volume - peak wt was 11 kg up, now is 1kg under admit wt. No gross edema on exam. CRRT dc'd.  Multifocal PNA - ID suspects PCP + secondary MSSA infection. Sp bronch today. May need trach. CXR's most cleared w/ some residual RUL infiltrates Septic shock: resolved AHRF: sp trach, looks better, but is quite weak and not weaning much yet HIV: ID managing Hypertension: BP's up, no strong indication for BP meds yet given recent shock on pressors.  DM2: Insulin management per primary Anemia macrocytic. Likely multifactorial with infection contributing. Transfuse PRBC's prn. Started darbe 40 ug per wk.   Kelly Splinter, MD 03/19/2021, 11:17 AM       Medications:  Current Facility-Administered Medications  Medication Dose Route Frequency Provider Last Rate Last Admin   0.9 %  sodium chloride infusion (Manually program via Guardrails IV Fluids)   Intravenous Once Brand Males, MD       0.9 %  sodium chloride infusion  250 mL Intravenous Continuous Candee Furbish, MD 10 mL/hr at 03/10/21 0111 Infusion Verify at 03/10/21 0111   0.9 %  sodium chloride infusion  250 mL Intravenous Continuous Anders Simmonds, MD 10 mL/hr at 03/19/21 0900 Infusion Verify at 03/19/21 0900   acetaminophen (TYLENOL) 160 MG/5ML solution 650 mg  650 mg Per Tube Q6H PRN Tanda Rockers, MD   650 mg at 03/13/21 1656   albuterol (PROVENTIL) (2.5 MG/3ML) 0.083% nebulizer solution 2.5 mg  2.5 mg Nebulization Q2H PRN Collier Bullock, MD       alteplase (CATHFLO ACTIVASE) injection 2 mg  2 mg Intracatheter Once Tanda Rockers, MD       alteplase (CATHFLO ACTIVASE) injection 2 mg  2 mg Intracatheter Once Roney Jaffe, MD       chlorhexidine gluconate (MEDLINE KIT) (PERIDEX) 0.12 % solution 15 mL  15 mL Mouth Rinse BID Candee Furbish, MD   15 mL at 03/19/21 3435   Chlorhexidine Gluconate Cloth 2 % PADS 6 each  6 each Topical Daily Brand Males, MD   6 each at 03/19/21 0514   clindamycin (CLEOCIN) IVPB 900 mg  900 mg Intravenous Q8H Carlyle Basques, MD   Stopped at 03/19/21 0544   clonazePAM (KLONOPIN) tablet 0.5 mg  0.5 mg Per Tube BID Rigoberto Noel, MD   0.5 mg at 03/19/21 6861   Darbepoetin Alfa (ARANESP) injection 40 mcg  40 mcg Subcutaneous Q Sun-1800 Claudia Desanctis, MD   40 mcg at 03/14/21 1817   dexmedetomidine (PRECEDEX) 400 MCG/100ML (4 mcg/mL) infusion  0.4-1.2 mcg/kg/hr Intravenous Titrated Simonne Maffucci B, MD 9.5 mL/hr at 03/19/21 0900 0.4 mcg/kg/hr at 03/19/21 0900   docusate (COLACE) 50 MG/5ML liquid 100 mg  100 mg Per Tube BID PRN Tanda Rockers, MD   100 mg at 03/08/21 1358  dolutegravir (TIVICAY) tablet 50 mg  50 mg Per Tube Daily Tanda Rockers, MD   50 mg at 03/19/21 0939   emtricitabine-tenofovir AF (DESCOVY) 200-25 MG per tablet 1 tablet  1 tablet Per Tube Daily Carlyle Basques, MD   1 tablet at 03/19/21 0939   feeding supplement (PIVOT 1.5 CAL) liquid 1,000 mL  1,000 mL Per Tube Q24H Juanito Doom, MD   Paused at 03/19/21 0800   feeding supplement (PROSource TF) liquid 90 mL  90 mL Per Tube QID Simonne Maffucci B, MD   90 mL at 03/19/21 0800   fentaNYL (SUBLIMAZE) injection 25-100 mcg  25-100 mcg Intravenous Q2H PRN Simonne Maffucci B, MD   50 mcg at 03/19/21 0941   gabapentin (NEURONTIN) 250 MG/5ML solution 100 mg  100 mg Per Tube Q12H  Bowser, Laurel Dimmer, NP   100 mg at 03/19/21 0945   heparin injection 1,000-6,000 Units  1,000-6,000 Units CRRT PRN Roney Jaffe, MD       heparin injection 5,000 Units  5,000 Units Subcutaneous Q8H Simonne Maffucci B, MD   5,000 Units at 03/19/21 0514   insulin aspart (novoLOG) injection 0-15 Units  0-15 Units Subcutaneous Q4H Ollis, Brandi L, NP   5 Units at 03/19/21 0829   insulin glargine (LANTUS) injection 10 Units  10 Units Subcutaneous Daily Rigoberto Noel, MD   10 Units at 03/19/21 1004   ipratropium-albuterol (DUONEB) 0.5-2.5 (3) MG/3ML nebulizer solution 3 mL  3 mL Nebulization Q4H Tyna Jaksch, MD   3 mL at 03/19/21 0814   lidocaine (LIDODERM) 5 % 1 patch  1 patch Transdermal Q24H Simonne Maffucci B, MD   1 patch at 03/18/21 1903   linezolid (ZYVOX) tablet 600 mg  600 mg Per Tube Kateri Mc, MD   600 mg at 03/19/21 0947   MEDLINE mouth rinse  15 mL Mouth Rinse 10 times per day Candee Furbish, MD   15 mL at 03/19/21 1002   methylPREDNISolone sodium succinate (SOLU-MEDROL) 40 mg/mL injection 30 mg  30 mg Intravenous Daily Simonne Maffucci B, MD   30 mg at 03/19/21 0945   metoprolol tartrate (LOPRESSOR) injection 2.5-5 mg  2.5-5 mg Intravenous Q3H PRN Cristal Generous, NP   2.5 mg at 03/19/21 1110   midazolam (VERSED) injection 1-2 mg  1-2 mg Intravenous Q2H PRN Cristal Generous, NP   2 mg at 03/19/21 0120   ondansetron (ZOFRAN) injection 4 mg  4 mg Intravenous Q6H PRN Collier Bullock, MD       oxyCODONE (ROXICODONE) 5 MG/5ML solution 5 mg  5 mg Per Tube Q12H Rigoberto Noel, MD       pantoprazole sodium (PROTONIX) 40 mg/20 mL oral suspension 40 mg  40 mg Per Tube QHS Leodis Sias T, RPH   40 mg at 03/18/21 2124   polyethylene glycol (MIRALAX / GLYCOLAX) packet 17 g  17 g Per Tube Daily PRN Tanda Rockers, MD       primaquine tablet 30 mg  30 mg Per Tube Daily Simonne Maffucci B, MD   30 mg at 03/19/21 0939   sodium chloride flush (NS) 0.9 % injection 10-40 mL  10-40 mL  Intracatheter Q12H Tanda Rockers, MD   10 mL at 03/19/21 0959   sodium chloride flush (NS) 0.9 % injection 10-40 mL  10-40 mL Intracatheter PRN Tanda Rockers, MD         Physical Exam: Vitals:   03/19/21 0803 03/19/21 0900  BP:  (!) 149/71  Pulse: 90 94  Resp: (!) 28 (!) 36  Temp:    SpO2: 99% 98%   Total I/O In: 105.6 [I.V.:50.6; NG/GT:55] Out: 531 [Other:531]  Intake/Output Summary (Last 24 hours) at 03/19/2021 1117 Last data filed at 03/19/2021 0900 Gross per 24 hour  Intake 2744.68 ml  Output 7797 ml  Net -5052.32 ml    General adult male on vent w/ trach   HEENT normocephalic atraumatic  Neck supple trachea midline, trach+ Lungs coarse mechanical breath sounds  Heart S1S2 no rub Abdomen soft nontender with limits of sedation distended Extremities minimal LE edema Neuro sedation running Access: RIJ nontunneled catheter in place   Test Results I personally reviewed new and old clinical labs and radiology tests Lab Results  Component Value Date   NA 137 03/19/2021   K 3.9 03/19/2021   CL 101 03/19/2021   CO2 26 03/19/2021   BUN 45 (H) 03/19/2021   CREATININE 1.56 (H) 03/19/2021   CALCIUM 8.9 03/19/2021   ALBUMIN 2.1 (L) 03/19/2021   PHOS 2.2 (L) 03/19/2021     [name], MD 03/19/2021 11:17 AM

## 2021-03-19 NOTE — Progress Notes (Signed)
Per Dr. Jonnie Finner, okay to stop CRRT when current filter has clotted. Transfer to [facility] for HD to be facilitated by CCM

## 2021-03-19 NOTE — Progress Notes (Signed)
Assisted tele visit to patient with family member.  Solmon Ice Kioni Stahl DNP [facility] RN

## 2021-03-19 NOTE — Progress Notes (Signed)
RT NOTE:  Pt had complaints of SOB on the ventilator and asked to go back on the ATC set up. Pt WOB decreased after placed on ATC and was able to clear a moderate amount of clear secretions on his own. Stated that he feels better on this than the ventilator. RT explained to pt that if he starts to get tired we would place him back on the vent, pt noted to have understanding. Vitals are stable at this time, RT will continue to monitor.

## 2021-03-19 NOTE — Progress Notes (Signed)
RT NOTE:  Pt placed on 35% ATC tolerating well with saturations of 99%. Ventilator is on standby at the bedside. Vitals are stable at this time, RT will continue to monitor.

## 2021-03-19 NOTE — Progress Notes (Signed)
Pt tolerating 28% trach collar well no signs of distress pt resting well hr 86 rr 26-34  spo2 99 ventilator at bedside for distress RT will continue to monitor throughout the night

## 2021-03-19 NOTE — Progress Notes (Signed)
NAME:  Bruce Hunter, MRN:  [id number], DOB:  [birth year], LOS: 30 ADMISSION DATE:  03/04/2021, CONSULTATION DATE:  6/30 REFERRING MD:  Melina Copa, CHIEF COMPLAINT:  Dyspnea   History of Present Illness:  45 y/o male with HIV/AIDS admitted with acute hypoxemic respiratory failure due to MSSA and presumed PJP pneumonia.    Pertinent  Medical History  Asthma DM2 Gout HTN Obesity GERD AKI Neuropathy  Significant Hospital Events: Including procedures, antibiotic start and stop dates in addition to other pertinent events   6/30 admitted for PNA with known HIV/AIDS 7/1 ETT >>>7/13 Echo 7/1 mild  RA dilation with RV low nl fxn mild PAS elevation and nl LV 7/1 - renal consult, ID consult 7/2 HD catheter >>> 7/5, Fi O2 50%, Pneumocystis DFA negative 03/06/21 ([facility]), MSSA on LRCx. Superficial LUE clot 7/6 - ETT tube advancec by CRNA 7/7 - CRRT stopped 7/8 - Expanding antibiotics to include CNS coverage including acyclovir.  7/9 LP. Febrile to 101 Fahrenheit.  White count 17.5 thousand and slightly better.  1PRBC. CRRT restarted. LP -opening pressure 38 cmH2O, cervical pressure 32 cm, clear colorless CSF, 91 mg percent glucose 0 RBC, 1 WBC, protein 30 mg/dL 7/10 continues on CRRT, MV, pressors 7/11 plt downtrending. Cont on MV CRRT NE. Weaning sedation, trial of vent wean x2 7/12 getting 1 PRBC. BAL -- sending for cx, fungal, PCP 7/13 tracheostomy BAL neg for PCP ag   Interim History / Subjective:   Afebrile, off pressors Remains on CRRT, critically ill Very weak and frail appearing 300 cc urine output  Objective   Blood pressure (!) 145/71, pulse 90, temperature 99.9 F (37.7 C), temperature source Oral, resp. rate (!) 28, height 5' 10.98" (1.803 m), weight 89.3 kg, SpO2 99 %.    Vent Mode: PRVC FiO2 (%):  [30 %-35 %] 35 % Set Rate:  [20 bmp] 20 bmp Vt Set:  [550 mL] 550 mL PEEP:  [5 cmH20-8 cmH20] 5 cmH20 Pressure Support:  [10 cmH20] 10 cmH20 Plateau Pressure:  [22  cmH20-26 cmH20] 22 cmH20   Intake/Output Summary (Last 24 hours) at 03/19/2021 1660 Last data filed at 03/19/2021 0700 Gross per 24 hour  Intake 2868.42 ml  Output 8202 ml  Net -5333.58 ml    Filed Weights   03/17/21 0500 03/18/21 0500 03/19/21 0500  Weight: 99.2 kg 93.8 kg 89.3 kg    Examination:  General: Critically ill, frail, in bed HENT: NCAT tracheostomy in place PULM: No accessory muscle use, bilateral coarse breath sounds CV: RRR, no mgr GI: BS+, soft, nontender MSK: normal bulk and tone Neuro: Awake, follows one-step commands, RASS +1 on Precedex   Chest x-ray 7/15 independently reviewed, bilateral interstitial and alveolar opacities especially in the hilar regions, mild improvement in right suprahilar airspace disease  Labs show increase leukocytosis, decreasing creatinine, stable anemia and thrombocytopenia  BAL from 7/12 > neg DFA for PCP, PCR pending   Resolved Hospital Problem list    Severe sepsis due to multifocal pneumonia  Assessment & Plan:  Acute metabolic encephalopathy: Had very high sedation needs earlier in the admission Chronic pain Anxiety Substance abuse disorder  Taper Precedex to off RASS target 0 to +1 Decrease clonazepam 0.5 twice daily and oxycodone to 5 mg every 12 with plan to gradually taper to off  Acute respiratory failure with hypoxemia due to MSSA pneumonia and presumed PCP: improving, completed course of cefazolin for MSSA x 14 ds, MRSE on BAL 7/12 S/p tracheostomy  Wean to trach  collar as tolerated VAP prevention Plan for Passy-Muir valve if tolerates trach collar Linezolid course per ID starting 7/14 Will decrease solumedrol Primaquin per ID, follow up PJP PCR (pending)    HIV/AIDS Continue antiretrovirals as ordered CD4 has improved to 325  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 due to ATN, requiring CRRT; per renal will likely not recover without intermittent HD at least for the next few weeks Hold CRRT once filter clots  and monitor urine output, 300 cc last 24 hours encouraging Plan to transfer to [facility] for intermittent HD  Anemia Thrombocytopenia Monitor for bleeding Transfuse PRBC for Hgb < 7 gm/dL  Hyperglycemia, related to steroids -Add Lantus 10 units while on steroids, please stop when Solu-Medrol discontinued  Severe deconditioning -PT/OT ongoing, plan for LTAC/SNF  Best Practice (right click and "Reselect all SmartList Selections" daily)   Diet/type: tubefeeds DVT prophylaxis: systemic heparin GI prophylaxis: N/A Lines: Dialysis Catheter Foley:  N/A Code Status:  full code Last date of multidisciplinary goals of care discussion [7/12: family desires full scope of practice, tracheostomy]  Labs   CBC: Recent Labs  Lab 03/15/21 0000 03/15/21 1220 03/16/21 0534 03/16/21 1300 03/17/21 0500 03/18/21 0254 03/19/21 0428  WBC 14.1*  --  11.0*  --  14.8* 15.5* 19.4*  NEUTROABS 10.2*  --   --   --   --   --  14.2*  HGB 7.1*  --  6.5* 7.3* 7.4* 7.4* 7.5*  HCT 24.2*  --  22.5* 24.8* 25.4* 25.8* 24.9*  MCV 97.6  --  98.3  --  96.9 98.5 98.0  PLT 133* 127* 157  --  201 182 169     Basic Metabolic Panel: Recent Labs  Lab 03/15/21 0453 03/15/21 1615 03/16/21 0534 03/16/21 1628 03/17/21 0500 03/17/21 1600 03/18/21 0254 03/18/21 1600 03/19/21 0428  NA 136   < > 138   < > 137 137 139 135 137  K 4.5   < > 5.1   < > 4.5 4.5 4.2 4.4 3.9  CL 103   < > 104   < > 104 102 104 102 101  CO2 26   < > 27   < > _0 GLUCOSE 151*   < > 196*   < > 172* 175* 214* 182* 215*  BUN 41*   < > 39*   < > 36* 42* 42* 43* 45*  CREATININE 1.86*  1.82*   < > 1.65*   < > 1.60* 1.78* 1.67* 1.69* 1.56*  CALCIUM 7.7*   < > 8.4*   < > 8.5* 8.6* 8.8* 8.9 8.9  MG 2.2  --  2.4  --  2.4  --  2.4  --  2.4  PHOS 3.0   < > 2.3*   < > 2.3* 3.3 3.1 2.9 2.2*   < > = values in this interval not displayed.    GFR: Estimated Creatinine Clearance: 64.4 mL/min (A) (by C-G formula based on SCr of 1.56  mg/dL (H)). Recent Labs  Lab 03/13/21 0914 03/13/21 0915 03/13/21 0943 03/14/21 0514 03/15/21 0000 03/15/21 0453 03/16/21 0534 03/17/21 0500 03/18/21 0254 03/19/21 0428  PROCALCITON  --  3.86  --  4.80  --  5.51  --   --   --   --   WBC  --   --    < > 13.1*   < >  --  11.0* 14.8* 15.5* 19.4*  LATICACIDVEN 1.2  --   --   --   --  1.0  --   --   --   --    < > = values in this interval not displayed.     Liver Function Tests: Recent Labs  Lab 03/13/21 1519 03/13/21 1730 03/14/21 0514 03/14/21 1625 03/15/21 1116 03/15/21 1615 03/17/21 0500 03/17/21 1600 03/18/21 0254 03/18/21 1600 03/19/21 0428  AST 26  --  24  --  30  --   --   --   --   --  41  ALT 6  --  5  --  7  --   --   --   --   --  13  ALKPHOS 91  --  91  --  144*  --   --   --   --   --  90  BILITOT 0.6  --  0.6  --  0.4  --   --   --   --   --  0.6  PROT 6.7  --  6.5  --  6.7  --   --   --   --   --  7.5  ALBUMIN 1.8*  1.8*   < > 1.8*  1.8*   < > 1.7*   < > 1.7* 1.8* 1.9* 2.1* 2.1*   < > = values in this interval not displayed.    No results for input(s): LIPASE, AMYLASE in the last 168 hours. No results for input(s): AMMONIA in the last 168 hours.  ABG    Component Value Date/Time   PHART 7.274 (L) 03/13/2021 1203   PCO2ART 46.1 03/13/2021 1203   PO2ART 96.8 03/13/2021 1203   HCO3 20.7 03/13/2021 1203   TCO2 27 02/26/2020 0635   ACIDBASEDEF 5.2 (H) 03/13/2021 1203   O2SAT 96.3 03/13/2021 1203     Coagulation Profile: Recent Labs  Lab 03/13/21 0943 03/15/21 1220  INR 1.1 1.0     Cardiac Enzymes: Recent Labs  Lab 03/13/21 0914  CKTOTAL 266  CKMB 2.1     HbA1C: Hgb A1c MFr Bld  Date/Time Value Ref Range Status  03/06/2021 10:31 AM 5.4 4.8 - 5.6 % Final    Comment:    (NOTE)         Prediabetes: 5.7 - 6.4         Diabetes: >6.4         Glycemic control for adults with diabetes: <7.0   02/12/2021 03:22 AM 5.9 (H) 4.8 - 5.6 % Final    Comment:    (NOTE)          Prediabetes: 5.7 - 6.4         Diabetes: >6.4         Glycemic control for adults with diabetes: <7.0     CBG: Recent Labs  Lab 03/18/21 1546 03/18/21 1954 03/18/21 2315 03/19/21 0351 03/19/21 0816  GLUCAP 162* 211* 219* 213* 204*     Critical care time: 32 minutes    Kara Mead MD. FCCP. [facility] Pager : [phone]  If no response to pager , please call [phone] until 7 pm After 7:00 pm call Elink  [phone]   03/19/2021

## 2021-03-19 NOTE — Progress Notes (Signed)
Nutrition Follow-up  DOCUMENTATION CODES:   Not applicable  INTERVENTION:  - will adjust TF regimen starting at midnight tonight: Pivot 1.5 @ 65 ml/hr with 1 packet Juven BID which will provide 2530 kcal, 151 grams protein, and 1170 ml free water. - will monitor for further need for adjustment after initiation of iHD. - diet advancement as medically feasible.    NUTRITION DIAGNOSIS:   Inadequate oral intake related to inability to eat as evidenced by NPO status. -ongoing  GOAL:   Patient will meet greater than or equal to 90% of their needs -to be met with TF regimen  MONITOR:   TF tolerance, Diet advancement, Labs, Weight trends, Skin  ASSESSMENT:   45 year-old male with medical history of recently diagnosed HIV/AIDS and non-compliant with HAART, DM, asthma, HLD, HTN, gout, and obesity. He presented to the ED d/t shortness of breath, peripheral edema, and productive (tan sputum) cough.  Significant Events: 6/30- admission 7/1- intubated; OGT placed 7/2- HD cath placed in R IJ; CRRT initiation 7/3- TF initiation per protocol 7/4- initial RD assessment; TF switched to Pivot 1.5 7/6- CRRT stopped 7/9- CRRT resumed 7/13- tracheostomy; bronch   Patient discussed in rounds this AM and plan to transfer to [facility] today vs tomorrow for iHD.   Patient was extubated from vent via trach to ATC. CRRT was stopped this shift. Re-estimated nutrition needs based on these events.    Weight today is the lowest weight this hospitalization; admitted on 6/30. Mild pitting edema to BUE and RLE documented in the edema section of flow sheet.   Small bore NGT remains in place in L nare. Order in place for Pivot 1.5 @ 55 ml/hr with 90 ml Prosource TF QID and 1 packet Juven BID. This regimen provides 2490 kcal, 217 grams protein, and 990 ml free water.   RN reports that patient is tolerating TF without issue this shift. Pivot 1.5 currently running at 68 ml/hr as patient requires TF be stopped  for 3 hours/morning for admin of medication (2 hours prior to med and 1 hour after).   He has trialed PMSV for a short time this shift and plan to continue trials.    Labs reviewed; CBGs: 213 and 204 mg/dl, BUN: 45 mg/dl, creatinine: 1.56 mg/dl, Phos: 2.2 mg/dl, GFR: 56 ml/min. Medications reviewed; sliding scale novolog, 10 units lantus/day, 30 mg solu-medrol/day, 40 mg protonix/day.   Diet Order:   Diet Order     None       EDUCATION NEEDS:   No education needs have been identified at this time  Skin:  Skin Assessment: Skin Integrity Issues: Skin Integrity Issues:: Stage II, Other (Comment) Stage II: bilateral buttocks Other: MASD coccyx; MARSI L chest from ETT holder (newly documented 7/13)  Last BM:  7/15 (300 ml via rectal tube)  Height:   Ht Readings from Last 1 Encounters:  03/17/21 5' 10.98" (1.803 m)    Weight:   Wt Readings from Last 1 Encounters:  03/19/21 89.3 kg      Estimated Nutritional Needs:  Kcal:  2400-2600 kcal Protein:  130-145 grams Fluid:  per MD       Jarome Matin, MS, RD, LDN, CNSC Inpatient Clinical Dietitian RD pager # available in [place]  After hours/weekend pager # available in [facility]

## 2021-03-19 NOTE — Progress Notes (Signed)
MD aware of hypertension, advised to continue giving PRN labetalol q2. Precedex restarted at 0.4 d/t ventilator dyssynchrony.

## 2021-03-19 NOTE — Evaluation (Addendum)
[facility] Speaking Valve - Evaluation Patient Details  Name: Bruce Hunter MRN: [id number] Date of Birth: September 24, 1975  Today's Date: 03/19/2021 Time: [id number] SLP Time Calculation (min) (ACUTE ONLY): 25 min  Past Medical History:  Past Medical History:  Diagnosis Date   Asthma    Diabetes mellitus without complication ([place])    Gout    Hyperlipidemia    Hypertension    Obesity    Past Surgical History:  Past Surgical History:  Procedure Laterality Date   BIOPSY  02/15/2021   Procedure: BIOPSY;  Surgeon: Ronnette Juniper, MD;  Location: [facility];  Service: Gastroenterology;;   COLONOSCOPY WITH PROPOFOL N/A 02/15/2021   Procedure: COLONOSCOPY WITH PROPOFOL;  Surgeon: Ronnette Juniper, MD;  Location: [facility];  Service: Gastroenterology;  Laterality: N/A;   ESOPHAGOGASTRODUODENOSCOPY (EGD) WITH PROPOFOL N/A 02/15/2021   Procedure: ESOPHAGOGASTRODUODENOSCOPY (EGD) WITH PROPOFOL;  Surgeon: Ronnette Juniper, MD;  Location: [facility];  Service: Gastroenterology;  Laterality: N/A;   GANGLION CYST EXCISION Right 02/26/2020   Procedure: EXCISION TOPHUS RIGHT FOOT;  Surgeon: Evelina Bucy, DPM;  Location: [place];  Service: Podiatry;  Laterality: Right;   NO PAST SURGERIES     HPI:  45 y.o. male with a past medical history significant for HIV/AIDS, CKD, asthma, obesity, DM2 admitted for acute hypoxic respiratory failure with sepsis, requiring ventilator support on 7/1 and tracheostomy 7/13 CRRT.  Assessment / Plan / Recommendation Clinical Impression  Bruce Hunter need encouragement during assessment due to pain and lack of sleep per pt. He was redirectible throughout and girlfriend at bedside. Pt on trach collar with copious secretions pior to cuff deflation, not significantly increased with deflation. Reflexive cough was moderate and able to clear and cease after 2-3 minutes. He verbalized immediately in clear, lower intensity with valve in place for approximately 12 minutes. RR  increased to 31 mostly around upper 20's. Speech intelligible with cues to increase volume. Recommend he wear with RN, SLP or RT with FULL supervision, remove during sleep and continued ST once he is transferred to [facility] for dialysis. SLP Visit Diagnosis: Aphonia (R49.1)    SLP Assessment  Patient needs continued Speech Lanaguage Pathology Services    Follow Up Recommendations   (TBD)    Frequency and Duration min 2x/week  2 weeks    PMSV Trial PMSV was placed for: 15 min Able to redirect subglottic air through upper airway: Yes Able to Attain Phonation: Yes Voice Quality: Low vocal intensity Able to Expectorate Secretions: Yes Level of Secretion Expectoration with PMSV: Tracheal Breath Support for Phonation: Mildly decreased Intelligibility: Intelligible Respirations During Trial:  (23-31) SpO2 During Trial: 100 % Pulse During Trial: 85 Behavior: Angry;Responsive to questions   Tracheostomy Tube       Vent Dependency  FiO2 (%): 35 %    Cuff Deflation Trial  GO Tolerated Cuff Deflation: Yes Length of Time for Cuff Deflation Trial: 25 min Behavior: Alert;Angry;Responsive to questions        Houston Siren 03/19/2021, 9:29 AM  Orbie Pyo Colvin Caroli.Ed Risk analyst [phone] Office [phone]

## 2021-03-19 NOTE — Progress Notes (Signed)
Patient was placed back on ventilator due to increased WOB

## 2021-03-19 NOTE — Progress Notes (Signed)
Tele began alarming ST elevation. EKG obtained, no ST-elevation noted.

## 2021-03-20 DIAGNOSIS — G9341 Metabolic encephalopathy: Secondary | ICD-10-CM | POA: Diagnosis not present

## 2021-03-20 DIAGNOSIS — F1610 Hallucinogen abuse, uncomplicated: Secondary | ICD-10-CM

## 2021-03-20 DIAGNOSIS — N179 Acute kidney failure, unspecified: Secondary | ICD-10-CM | POA: Diagnosis not present

## 2021-03-20 DIAGNOSIS — R6521 Severe sepsis with septic shock: Secondary | ICD-10-CM

## 2021-03-20 DIAGNOSIS — D893 Immune reconstitution syndrome: Secondary | ICD-10-CM

## 2021-03-20 DIAGNOSIS — B20 Human immunodeficiency virus [HIV] disease: Secondary | ICD-10-CM | POA: Diagnosis not present

## 2021-03-20 DIAGNOSIS — Z930 Tracheostomy status: Secondary | ICD-10-CM | POA: Diagnosis not present

## 2021-03-20 DIAGNOSIS — J9601 Acute respiratory failure with hypoxia: Secondary | ICD-10-CM | POA: Diagnosis not present

## 2021-03-20 LAB — PNEUMOCYSTIS PCR: Result Pneumocystis PCR: NEGATIVE

## 2021-03-20 LAB — CBC
HCT: 24 % — ABNORMAL LOW (ref 39.0–52.0)
Hemoglobin: 7 g/dL — ABNORMAL LOW (ref 13.0–17.0)
MCH: 28.7 pg (ref 26.0–34.0)
MCHC: 29.2 g/dL — ABNORMAL LOW (ref 30.0–36.0)
MCV: 98.4 fL (ref 80.0–100.0)
Platelets: 171 K/uL (ref 150–400)
RBC: 2.44 MIL/uL — ABNORMAL LOW (ref 4.22–5.81)
RDW: 22.7 % — ABNORMAL HIGH (ref 11.5–15.5)
WBC: 20.3 K/uL — ABNORMAL HIGH (ref 4.0–10.5)
nRBC: 0.1 % (ref 0.0–0.2)

## 2021-03-20 LAB — GLUCOSE, CAPILLARY
Glucose-Capillary: 234 mg/dL — ABNORMAL HIGH (ref 70–99)
Glucose-Capillary: 215 mg/dL — ABNORMAL HIGH (ref 70–99)
Glucose-Capillary: 158 mg/dL — ABNORMAL HIGH (ref 70–99)
Glucose-Capillary: 177 mg/dL — ABNORMAL HIGH (ref 70–99)
Glucose-Capillary: 153 mg/dL — ABNORMAL HIGH (ref 70–99)
Glucose-Capillary: 124 mg/dL — ABNORMAL HIGH (ref 70–99)

## 2021-03-20 LAB — BASIC METABOLIC PANEL WITH GFR
Anion gap: 12 (ref 5–15)
BUN: 97 mg/dL — ABNORMAL HIGH (ref 6–20)
CO2: 23 mmol/L (ref 22–32)
Calcium: 9 mg/dL (ref 8.9–10.3)
Chloride: 104 mmol/L (ref 98–111)
Creatinine, Ser: 2.79 mg/dL — ABNORMAL HIGH (ref 0.61–1.24)
GFR, Estimated: 28 mL/min — ABNORMAL LOW
Glucose, Bld: 209 mg/dL — ABNORMAL HIGH (ref 70–99)
Potassium: 3.9 mmol/L (ref 3.5–5.1)
Sodium: 139 mmol/L (ref 135–145)

## 2021-03-20 MED ORDER — IPRATROPIUM-ALBUTEROL 0.5-2.5 (3) MG/3ML IN SOLN
3.0000 mL | Freq: Three times a day (TID) | RESPIRATORY_TRACT | Status: DC
  Administered 2021-03-21 – 2021-03-26 (×18): 3 mL via RESPIRATORY_TRACT
  Filled 2021-03-20 (×17): qty 3

## 2021-03-20 MED ORDER — PREDNISONE 5 MG/ML PO CONC
20.0000 mg | Freq: Every day | ORAL | Status: DC
  Administered 2021-03-20 – 2021-03-21 (×2): 20 mg
  Filled 2021-03-20 (×4): qty 4

## 2021-03-20 MED ORDER — LIP MEDEX EX OINT
TOPICAL_OINTMENT | CUTANEOUS | Status: DC | PRN
  Filled 2021-03-20: qty 7

## 2021-03-20 NOTE — Progress Notes (Signed)
Report given to Josh at [facility].   Patient transferred to [facility] via [facility] at approximately 2030.   Evening meds given prior to transport.  Three bags of patient belongings were also sent.

## 2021-03-20 NOTE — Progress Notes (Signed)
Subjective: Patient did not respond to me when I came to see him he was sleeping.  His girlfriend said that he had been doing a lot of therapy prior to my arrival   Antibiotics:  Anti-infectives (From admission, onward)    Start     Dose/Rate Route Frequency Ordered Stop   03/18/21 1530  linezolid (ZYVOX) tablet 600 mg        600 mg Per Tube Every 12 hours 03/18/21 1431     03/18/21 1515  linezolid (ZYVOX) 100 MG/5ML suspension 600 mg  Status:  Discontinued        600 mg Per Tube Every 12 hours 03/18/21 1427 03/18/21 1431   03/17/21 1000  primaquine tablet 30 mg        30 mg Per Tube Daily 03/17/21 0856     03/16/21 1000  ceFAZolin (ANCEF) IVPB 1 g/50 mL premix  Status:  Discontinued        1 g 100 mL/hr over 30 Minutes Intravenous Every 12 hours 03/15/21 1251 03/15/21 1301   03/16/21 1000  ceFAZolin (ANCEF) 2 g in sodium chloride 0.9 % 100 mL IVPB  Status:  Discontinued        2 g 200 mL/hr over 30 Minutes Intravenous Every 12 hours 03/15/21 1302 03/18/21 1427   03/13/21 2200  fluconazole (DIFLUCAN) IVPB 400 mg        400 mg 100 mL/hr over 120 Minutes Intravenous Every 24 hours 03/13/21 1047 03/19/21 0031   03/13/21 1800  acyclovir (ZOVIRAX) 900 mg in dextrose 5 % 150 mL IVPB  Status:  Discontinued        900 mg 168 mL/hr over 60 Minutes Intravenous Every 24 hours 03/13/21 0958 03/13/21 1047   03/13/21 1800  acyclovir (ZOVIRAX) 900 mg in dextrose 5 % 150 mL IVPB  Status:  Discontinued        900 mg 168 mL/hr over 60 Minutes Intravenous Every 12 hours 03/13/21 1047 03/14/21 1700   03/13/21 1000  emtricitabine-tenofovir AF (DESCOVY) 200-25 MG per tablet 1 tablet        1 tablet Per Tube Daily 03/12/21 1350     03/12/21 2200  vancomycin (VANCOREADY) IVPB 750 mg/150 mL  Status:  Discontinued        750 mg 150 mL/hr over 60 Minutes Intravenous Every 12 hours 03/12/21 1249 03/12/21 1806   03/12/21 2200  clindamycin (CLEOCIN) IVPB 900 mg        900 mg 100 mL/hr over 30  Minutes Intravenous Every 8 hours 03/12/21 1729     03/12/21 1815  primaquine tablet 30 mg  Status:  Discontinued        30 mg Oral Daily 03/12/21 1729 03/17/21 0856   03/12/21 1400  vancomycin (VANCOREADY) IVPB 1000 mg/200 mL        1,000 mg 200 mL/hr over 60 Minutes Intravenous  Once 03/12/21 1249 03/12/21 1540   03/12/21 1400  acyclovir (ZOVIRAX) 900 mg in dextrose 5 % 150 mL IVPB  Status:  Discontinued        900 mg 168 mL/hr over 60 Minutes Intravenous Every 12 hours 03/12/21 1249 03/13/21 0958   03/12/21 1300  cefTRIAXone (ROCEPHIN) 2 g in sodium chloride 0.9 % 100 mL IVPB  Status:  Discontinued        2 g 200 mL/hr over 30 Minutes Intravenous Every 12 hours 03/12/21 1203 03/15/21 1251   03/10/21 2200  fluconazole (DIFLUCAN) IVPB 200 mg  Status:  Discontinued        200 mg 100 mL/hr over 60 Minutes Intravenous Every 24 hours 03/10/21 1333 03/13/21 1047   03/10/21 1800  ceFAZolin (ANCEF) IVPB 2g/100 mL premix  Status:  Discontinued        2 g 200 mL/hr over 30 Minutes Intravenous Every 8 hours 03/10/21 1325 03/12/21 1203   03/09/21 2200  ceFAZolin (ANCEF) IVPB 2g/100 mL premix  Status:  Discontinued        2 g 200 mL/hr over 30 Minutes Intravenous Every 12 hours 03/09/21 1445 03/10/21 1325   03/09/21 2000  ceFAZolin (ANCEF) IVPB 2g/100 mL premix  Status:  Discontinued        2 g 200 mL/hr over 30 Minutes Intravenous Every 8 hours 03/09/21 1440 03/09/21 1445   03/09/21 1645  dapsone tablet 100 mg  Status:  Discontinued        100 mg Per Tube Daily 03/09/21 1556 03/12/21 1729   03/07/21 2200  primaquine tablet 30 mg  Status:  Discontinued        30 mg Per Tube Every 24 hours 03/07/21 0915 03/09/21 1439   03/07/21 1000  dolutegravir (TIVICAY) tablet 50 mg        50 mg Per Tube Daily 03/06/21 1132     03/07/21 1000  lamiVUDine (EPIVIR) tablet 150 mg  Status:  Discontinued        150 mg Per Tube Daily 03/06/21 1324 03/12/21 1350   03/06/21 2200  fluconazole (DIFLUCAN) IVPB 400 mg   Status:  Discontinued        400 mg 100 mL/hr over 120 Minutes Intravenous Every 24 hours 03/06/21 1251 03/10/21 1333   03/06/21 1400  ceFEPIme (MAXIPIME) 2 g in sodium chloride 0.9 % 100 mL IVPB  Status:  Discontinued        2 g 200 mL/hr over 30 Minutes Intravenous Every 12 hours 03/06/21 1251 03/09/21 1439   03/05/21 2130  fluconazole (DIFLUCAN) IVPB 200 mg  Status:  Discontinued        200 mg 100 mL/hr over 60 Minutes Intravenous Every 24 hours 03/05/21 0810 03/06/21 1251   03/05/21 1000  dolutegravir (TIVICAY) tablet 50 mg  Status:  Discontinued        50 mg Oral Daily 03/04/21 2214 03/06/21 1132   03/05/21 1000  lamiVUDine (EPIVIR) tablet 300 mg  Status:  Discontinued        300 mg Oral Daily 03/04/21 2217 03/04/21 2217   03/05/21 1000  lamiVUDine (EPIVIR) tablet 150 mg  Status:  Discontinued        150 mg Oral Daily 03/04/21 2217 03/06/21 1324   03/04/21 2200  clindamycin (CLEOCIN) IVPB 900 mg  Status:  Discontinued        900 mg 100 mL/hr over 30 Minutes Intravenous Every 8 hours 03/04/21 2157 03/09/21 1439   03/04/21 2200  primaquine tablet 30 mg  Status:  Discontinued        30 mg Oral Every 24 hours 03/04/21 2157 03/07/21 0915   03/04/21 2145  vancomycin (VANCOREADY) IVPB 1500 mg/300 mL        1,500 mg 150 mL/hr over 120 Minutes Intravenous  Once 03/04/21 2144 03/05/21 0050   03/04/21 2145  ceFEPIme (MAXIPIME) 2 g in sodium chloride 0.9 % 100 mL IVPB  Status:  Discontinued        2 g 200 mL/hr over 30 Minutes Intravenous Every 24 hours 03/04/21 2144 03/06/21 1251   03/04/21 2143  vancomycin variable dose per unstable renal function (pharmacist dosing)  Status:  Discontinued         Does not apply See admin instructions 03/04/21 2144 03/06/21 0813   03/04/21 2130  fluconazole (DIFLUCAN) IVPB 400 mg  Status:  Discontinued        400 mg 100 mL/hr over 120 Minutes Intravenous Every 24 hours 03/04/21 2126 03/05/21 0810   03/04/21 1915  cefTRIAXone (ROCEPHIN) 1 g in sodium  chloride 0.9 % 100 mL IVPB        1 g 200 mL/hr over 30 Minutes Intravenous  Once 03/04/21 1914 03/04/21 2045   03/04/21 1915  azithromycin (ZITHROMAX) 500 mg in sodium chloride 0.9 % 250 mL IVPB        500 mg 250 mL/hr over 60 Minutes Intravenous  Once 03/04/21 1914 03/04/21 2201       Medications: Scheduled Meds:  sodium chloride   Intravenous Once   alteplase  2 mg Intracatheter Once   alteplase  2 mg Intracatheter Once   amLODipine  10 mg Per Tube Daily   chlorhexidine gluconate (MEDLINE KIT)  15 mL Mouth Rinse BID   Chlorhexidine Gluconate Cloth  6 each Topical Daily   clonazePAM  0.5 mg Per Tube BID   cloNIDine  0.1 mg Per Tube BID   darbepoetin (ARANESP) injection - NON-DIALYSIS  40 mcg Subcutaneous Q Sun-1800   dolutegravir  50 mg Per Tube Daily   emtricitabine-tenofovir AF  1 tablet Per Tube Daily   feeding supplement (PIVOT 1.5 CAL)  1,000 mL Per Tube Q24H   gabapentin  100 mg Per Tube Q12H   heparin injection (subcutaneous)  5,000 Units Subcutaneous Q8H   insulin aspart  0-15 Units Subcutaneous Q4H   insulin glargine  10 Units Subcutaneous Daily   ipratropium-albuterol  3 mL Nebulization Q4H   lidocaine  1 patch Transdermal Q24H   linezolid  600 mg Per Tube Q12H   mouth rinse  15 mL Mouth Rinse 10 times per day   oxyCODONE  5 mg Per Tube Q12H   pantoprazole sodium  40 mg Per Tube QHS   predniSONE  20 mg Per Tube Q breakfast   primaquine  30 mg Per Tube Daily   sodium chloride flush  10-40 mL Intracatheter Q12H   Continuous Infusions:  sodium chloride 10 mL/hr at 03/10/21 0111   sodium chloride Stopped (03/19/21 2158)   clindamycin (CLEOCIN) IV Stopped (03/20/21 1336)   dexmedetomidine (PRECEDEX) IV infusion 0.5 mcg/kg/hr (03/20/21 1400)   PRN Meds:.acetaminophen, albuterol, docusate, fentaNYL (SUBLIMAZE) injection, heparin, labetalol, midazolam, ondansetron (ZOFRAN) IV, polyethylene glycol, sodium chloride flush    Objective: Weight change:    Intake/Output Summary (Last 24 hours) at 03/20/2021 1657 Last data filed at 03/20/2021 1446 Gross per 24 hour  Intake 717.58 ml  Output 1375 ml  Net -657.42 ml   Blood pressure (!) 148/75, pulse 72, temperature 97.9 F (36.6 C), temperature source Oral, resp. rate (!) 37, height 5' 10.98" (1.803 m), weight 89.3 kg, SpO2 99 %. Temp:  [97.9 F (36.6 C)-99.9 F (37.7 C)] 97.9 F (36.6 C) (07/16 1148) Pulse Rate:  [69-109] 72 (07/16 1600) Resp:  [19-38] 37 (07/16 1600) BP: (144-200)/(60-103) 148/75 (07/16 1600) SpO2:  [85 %-100 %] 99 % (07/16 1600) FiO2 (%):  [28 %] 28 % (07/16 1600)  Physical Exam: Physical Exam HENT:     Head: Normocephalic and atraumatic.  Neck:     Trachea: Tracheostomy present.  Cardiovascular:  Rate and Rhythm: Normal rate and regular rhythm.     Heart sounds: No murmur heard.   No friction rub. No gallop.  Pulmonary:     Breath sounds: Rhonchi present.  Abdominal:     General: There is no distension.     Palpations: There is no mass.     Tenderness: There is no abdominal tenderness.     Hernia: No hernia is present.  Musculoskeletal:        General: No swelling, tenderness, deformity or signs of injury.  Neurological:     Mental Status: He is disoriented.    He was fairly somnolent when I saw him and wearing restraints  CBC:    BMET Recent Labs    03/19/21 0428 03/20/21 1026  NA 137 139  K 3.9 3.9  CL 101 104  CO2 26 23  GLUCOSE 215* 209*  BUN 45* 97*  CREATININE 1.56* 2.79*  CALCIUM 8.9 9.0     Liver Panel  Recent Labs    03/18/21 1600 03/19/21 0428  PROT  --  7.5  ALBUMIN 2.1* 2.1*  AST  --  41  ALT  --  13  ALKPHOS  --  90  BILITOT  --  0.6       Sedimentation Rate No results for input(s): ESRSEDRATE in the last 72 hours. C-Reactive Protein No results for input(s): CRP in the last 72 hours.  Micro Results: Recent Results (from the past 720 hour(s))  Culture, blood (routine x 2)     Status: None    Collection Time: 03/04/21  6:14 PM   Specimen: BLOOD LEFT FOREARM  Result Value Ref Range Status   Specimen Description   Final    BLOOD LEFT FOREARM Performed at [facility] [address], [place], [place] [address]    Special Requests   Final    BOTTLES DRAWN AEROBIC AND ANAEROBIC Blood Culture adequate volume Performed at [facility] [address] [place], [place] [address]    Culture   Final    NO GROWTH 5 DAYS Performed at [facility], [place] [address], [place],  [place] [address]    Report Status 03/09/2021 FINAL  Final  Resp Panel by RT-PCR (Flu A&B, Covid) Nasopharyngeal Swab     Status: None   Collection Time: 03/04/21  7:52 PM   Specimen: Nasopharyngeal Swab; Nasopharyngeal(NP) swabs in vial transport medium  Result Value Ref Range Status   SARS Coronavirus 2 by RT PCR NEGATIVE NEGATIVE Final    Comment: (NOTE) SARS-CoV-2 target nucleic acids are NOT DETECTED.  The SARS-CoV-2 RNA is generally detectable in upper respiratory specimens during the acute phase of infection. The lowest concentration of SARS-CoV-2 viral copies this assay can detect is 138 copies/mL. A negative result does not preclude SARS-Cov-2 infection and should not be used as the sole basis for treatment or other patient management decisions. A negative result may occur with  improper specimen collection/handling, submission of specimen other than nasopharyngeal swab, presence of viral mutation(s) within the areas targeted by this assay, and inadequate number of viral copies(<138 copies/mL). A negative result must be combined with clinical observations, patient history, and epidemiological information. The expected result is Negative.  Fact Sheet for Patients:  EntrepreneurPulse.com.au  Fact Sheet for Healthcare Providers:  IncredibleEmployment.be  This test is no t yet approved or cleared by the [place] FDA  and  has been authorized for detection and/or diagnosis of SARS-CoV-2 by FDA under an  Emergency Use Authorization (EUA). This EUA will remain  in effect (meaning this test can be used) for the duration of the COVID-19 declaration under Section 564(b)(1) of the Act, 21 U.S.C.section 360bbb-3(b)(1), unless the authorization is terminated  or revoked sooner.       Influenza A by PCR NEGATIVE NEGATIVE Final   Influenza B by PCR NEGATIVE NEGATIVE Final    Comment: (NOTE) The Xpert Xpress SARS-CoV-2/FLU/RSV plus assay is intended as an aid in the diagnosis of influenza from Nasopharyngeal swab specimens and should not be used as a sole basis for treatment. Nasal washings and aspirates are unacceptable for Xpert Xpress SARS-CoV-2/FLU/RSV testing.  Fact Sheet for Patients: EntrepreneurPulse.com.au  Fact Sheet for Healthcare Providers: IncredibleEmployment.be  This test is not yet approved or cleared by the [place] FDA and has been authorized for detection and/or diagnosis of SARS-CoV-2 by FDA under an Emergency Use Authorization (EUA). This EUA will remain in effect (meaning this test can be used) for the duration of the COVID-19 declaration under Section 564(b)(1) of the Act, 21 U.S.C. section 360bbb-3(b)(1), unless the authorization is terminated or revoked.  Performed at [facility], [place] [address], [place], [place] [address]   MRSA PCR Screening     Status: None   Collection Time: 03/05/21 10:41 AM  Result Value Ref Range Status   MRSA by PCR NEGATIVE NEGATIVE Final    Comment:        The GeneXpert MRSA Assay (FDA approved for NASAL specimens only), is one component of a comprehensive MRSA colonization surveillance program. It is not intended to diagnose MRSA infection nor to guide or monitor treatment for MRSA infections. Performed at [facility], [place] [address], [place], [place]  [address]   Respiratory (~20 pathogens) panel by PCR     Status: None   Collection Time: 03/05/21 10:42 AM   Specimen: Nasopharyngeal Swab; Respiratory  Result Value Ref Range Status   Adenovirus NOT DETECTED NOT DETECTED Final   Coronavirus 229E NOT DETECTED NOT DETECTED Final    Comment: (NOTE) The Coronavirus on the Respiratory Panel, DOES NOT test for the novel  Coronavirus (2019 nCoV)    Coronavirus HKU1 NOT DETECTED NOT DETECTED Final   Coronavirus NL63 NOT DETECTED NOT DETECTED Final   Coronavirus OC43 NOT DETECTED NOT DETECTED Final   Metapneumovirus NOT DETECTED NOT DETECTED Final   Rhinovirus / Enterovirus NOT DETECTED NOT DETECTED Final   Influenza A NOT DETECTED NOT DETECTED Final   Influenza B NOT DETECTED NOT DETECTED Final   Parainfluenza Virus 1 NOT DETECTED NOT DETECTED Final   Parainfluenza Virus 2 NOT DETECTED NOT DETECTED Final   Parainfluenza Virus 3 NOT DETECTED NOT DETECTED Final   Parainfluenza Virus 4 NOT DETECTED NOT DETECTED Final   Respiratory Syncytial Virus NOT DETECTED NOT DETECTED Final   Bordetella pertussis NOT DETECTED NOT DETECTED Final   Bordetella Parapertussis NOT DETECTED NOT DETECTED Final   Chlamydophila pneumoniae NOT DETECTED NOT DETECTED Final   Mycoplasma pneumoniae NOT DETECTED NOT DETECTED Final    Comment: Performed at [facility], [place]. [address] [place], [place] [address]  Gastrointestinal Panel by PCR , Stool     Status: None   Collection Time: 03/05/21  6:28 PM   Specimen: Stool  Result Value Ref Range Status   Campylobacter species NOT DETECTED NOT DETECTED Final   Plesimonas shigelloides NOT DETECTED NOT DETECTED Final   Salmonella species NOT DETECTED NOT DETECTED Final   Yersinia enterocolitica  NOT DETECTED NOT DETECTED Final   Vibrio species NOT DETECTED NOT DETECTED Final   Vibrio cholerae NOT DETECTED NOT DETECTED Final   Enteroaggregative E coli (EAEC) NOT DETECTED NOT DETECTED Final   Enteropathogenic E coli  (EPEC) NOT DETECTED NOT DETECTED Final   Enterotoxigenic E coli (ETEC) NOT DETECTED NOT DETECTED Final   Shiga like toxin producing E coli (STEC) NOT DETECTED NOT DETECTED Final   Shigella/Enteroinvasive E coli (EIEC) NOT DETECTED NOT DETECTED Final   Cryptosporidium NOT DETECTED NOT DETECTED Final   Cyclospora cayetanensis NOT DETECTED NOT DETECTED Final   Entamoeba histolytica NOT DETECTED NOT DETECTED Final   Giardia lamblia NOT DETECTED NOT DETECTED Final   Adenovirus F40/41 NOT DETECTED NOT DETECTED Final   Astrovirus NOT DETECTED NOT DETECTED Final   Norovirus GI/GII NOT DETECTED NOT DETECTED Final   Rotavirus A NOT DETECTED NOT DETECTED Final   Sapovirus (I, II, IV, and V) NOT DETECTED NOT DETECTED Final    Comment: Performed at [facility], [place] [place], [place] [address]  Pneumocystis smear by DFA     Status: None   Collection Time: 03/06/21  1:09 PM   Specimen: Sputum; Respiratory  Result Value Ref Range Status   Specimen Source-PJSRC SPUTUM  Final   Pneumocystis jiroveci Ag NEGATIVE  Final    Comment: Performed at [place] Performed at [place] [facility] Performed at [facility], [place] [address], [place], [place] [address]   Culture, Respiratory w Gram Stain     Status: None   Collection Time: 03/06/21  1:10 PM   Specimen: Tracheal Aspirate; Respiratory  Result Value Ref Range Status   Specimen Description   Final    TRACHEAL ASPIRATE Performed at [facility] [address] [place], [place] [address]    Special Requests   Final    Immunocompromised Performed at [facility], [place] [address], [place], [place] [address]    Gram Stain   Final    FEW WBC PRESENT,BOTH PMN AND MONONUCLEAR RARE SQUAMOUS EPITHELIAL CELLS PRESENT RARE GRAM POSITIVE RODS RARE GRAM POSITIVE COCCI IN PAIRS Performed at [facility], [place] [address] [place], [place] [address]     Culture RARE STAPHYLOCOCCUS AUREUS  Final   Report Status 03/09/2021 FINAL  Final   Organism ID, Bacteria STAPHYLOCOCCUS AUREUS  Final      Susceptibility   Staphylococcus aureus - MIC*    CIPROFLOXACIN <=0.5 SENSITIVE Sensitive     ERYTHROMYCIN <=0.25 SENSITIVE Sensitive     GENTAMICIN <=0.5 SENSITIVE Sensitive     OXACILLIN <=0.25 SENSITIVE Sensitive     TETRACYCLINE >=16 RESISTANT Resistant     VANCOMYCIN <=0.5 SENSITIVE Sensitive     TRIMETH/SULFA <=10 SENSITIVE Sensitive     CLINDAMYCIN <=0.25 SENSITIVE Sensitive     RIFAMPIN <=0.5 SENSITIVE Sensitive     Inducible Clindamycin NEGATIVE Sensitive     * RARE STAPHYLOCOCCUS AUREUS  Blastomyces Antigen     Status: None   Collection Time: 03/12/21  5:55 PM   Specimen: Blood  Result Value Ref Range Status   Blastomyces Antigen None Detected None Detected ng/mL Final    Comment: (NOTE) Results reported as ng/mL in 0.2 - 14.7 ng/mL range Results above the limit of detection but below 0.2 ng/mL are reported as 'Positive, Below the Limit of Quantification' Results above 14.7 ng/mL are reported as 'Positive, Above the Limit of Quantification'  Specimen Type SERUM  Final    Comment: (NOTE) Performed At: [facility] [place], [place] [id number] Bruce Donath MD [id number]   Virus culture     Status: None   Collection Time: 03/12/21  7:08 PM   Specimen: CSF  Result Value Ref Range Status   Viral Culture Comment  Final    Comment: (NOTE) Preliminary Report: No virus isolated at 4 days.  Next report to follow after 7 days. Performed At: [facility] [place], [place] [id number] Rush Farmer MD FT:[id number]    Source of Sample BLOOD  Final    Comment: Performed at [facility], [place] [address] [place], [place] [address]  Virus culture     Status: None   Collection Time: 03/13/21  4:42 PM   Specimen: PATH Cytology CSF; Cerebrospinal Fluid  Result  Value Ref Range Status   Viral Culture Comment  Final    Comment: (NOTE) Preliminary Report: No virus isolated at 4 days.  Next report to follow after 7 days. Performed At: [facility] [place], [place] [id number] Rush Farmer MD [id number]    Source of Sample CSF  Final    Comment: Performed at [facility], [place] [address], [place], [place] [address]  CSF culture w Gram Stain     Status: None   Collection Time: 03/13/21  4:42 PM   Specimen: PATH Cytology CSF; Cerebrospinal Fluid  Result Value Ref Range Status   Specimen Description   Final    CSF Performed at [facility] [address], [place], [place] [address]    Special Requests   Final    NONE Performed at [facility] [place] [address] [place], [place] [address]    Gram Stain   Final    NO ORGANISMS SEEN Gram Stain Report Called to,Read Back By and Verified With: ETHAN RN AT 1829 ON 03/13/21 BY S.VANHOORNE Performed at [facility], [place] [address] [place], [place] [address]    Culture   Final    NO GROWTH 3 DAYS Performed at [facility], [place] [address], [place], [place] [address]    Report Status 03/17/2021 FINAL  Final  Fungus Culture With Stain     Status: Abnormal   Collection Time: 03/13/21  4:42 PM   Specimen: PATH Cytology CSF; Cerebrospinal Fluid  Result Value Ref Range Status   Fungus Stain QNSTST (A)  Final    Comment: (NOTE) Test not performed. Insufficient specimen to perform or complete analysis.      Darrick Meigs S. was notified 03/17/2021.    Fungus (Mycology) Culture QNSTST (A)  Final    Comment: (NOTE) Test not performed. Insufficient specimen to perform or complete analysis.      Darrick Meigs S. was notified 03/17/2021. Performed At: [facility] [place], [place] [id number] Rush Farmer MD [id number]    Fungal Source CSF  Final    Comment: Performed at [place]  [address] [place], [place] [address]  Culture, fungus without smear     Status: None (Preliminary result)   Collection Time: 03/13/21  4:42 PM   Specimen: PATH Cytology CSF; Cerebrospinal Fluid  Result Value Ref Range Status   Specimen Description   Final    CSF Performed at [place] [address] [place], [place] [address]    Special Requests   Final  NONE Performed at [facility], [place] [address], [place], [place] [address]    Culture   Final    NO FUNGUS ISOLATED AFTER 7 DAYS Performed at [facility], [place] [address] [place], [place] [address]    Report Status PENDING  Incomplete  Culture, Respiratory w Gram Stain     Status: None   Collection Time: 03/14/21  3:15 AM   Specimen: Tracheal Aspirate; Respiratory  Result Value Ref Range Status   Specimen Description   Final    TRACHEAL ASPIRATE Performed at [place] [address] [place], [place] [address]    Special Requests   Final    NONE Performed at [facility], [place] [address], [place], [place] [address]    Gram Stain   Final    MODERATE WBC PRESENT,BOTH PMN AND MONONUCLEAR NO ORGANISMS SEEN    Culture   Final    NO GROWTH 2 DAYS Performed at [facility], [place] [address], [place], [place] [address]    Report Status 03/16/2021 FINAL  Final  Culture, Respiratory w Gram Stain     Status: None   Collection Time: 03/16/21  9:40 AM   Specimen: Bronchoalveolar Lavage; Respiratory  Result Value Ref Range Status   Specimen Description   Final    BRONCHIAL ALVEOLAR LAVAGE Performed at [facility] [address] [place], [place] [address]    Special Requests   Final    NONE Performed at [facility], [place] [address] [place], [place] [address]    Gram Stain   Final    FEW WBC PRESENT,BOTH PMN AND MONONUCLEAR NO ORGANISMS SEEN    Culture   Final     RARE Normal respiratory flora-no Staph aureus or Pseudomonas seen Performed at [facility], [address], [place], [place] [address]    Report Status 03/18/2021 FINAL  Final  Fungus Culture With Stain     Status: None (Preliminary result)   Collection Time: 03/16/21  9:40 AM   Specimen: Bronchoalveolar Lavage  Result Value Ref Range Status   Fungus Stain Final report  Final    Comment: (NOTE) Performed At: [facility] [place], [place] [id number] Rush Farmer MD [id number]    Fungus (Mycology) Culture PENDING  Incomplete   Fungal Source BRONCHIAL ALVEOLAR LAVAGE  Final    Comment: Performed at [facility], [place] [address], [place], [place] [address]  Pneumocystis smear by DFA     Status: None   Collection Time: 03/16/21  9:40 AM   Specimen: Bronchoalveolar Lavage; Respiratory  Result Value Ref Range Status   Specimen Source-PJSRC BRONCHIAL ALVEOLAR LAVAGE  Final   Pneumocystis jiroveci Ag NEGATIVE  Final    Comment: Performed at [facility] [address], [place], [place] [address]  Fungus Culture Result     Status: None   Collection Time: 03/16/21  9:40 AM  Result Value Ref Range Status   Result 1 Comment  Final    Comment: (NOTE) KOH/Calcofluor preparation:  no fungus observed. Performed At: [facility] [place], [place] [id number] Rush Farmer MD [id number]   Fungus Culture With Stain     Status: None (Preliminary result)   Collection Time: 03/16/21  9:41 AM   Specimen: Bronchoalveolar Lavage  Result Value Ref Range Status   Fungus Stain Final report  Final    Comment: (NOTE) Performed At: [facility]  [facility] [place], [place] [id number] Rush Farmer MD [id number]    Fungus (Mycology) Culture PENDING  Incomplete   Fungal Source BRONCHIAL ALVEOLAR LAVAGE  Final    Comment: Performed at [facility], [place] [address],  [place], [place] [address]  Culture, Respiratory w Gram Stain     Status: None   Collection Time: 03/16/21  9:41 AM   Specimen: Bronchoalveolar Lavage; Respiratory  Result Value Ref Range Status   Specimen Description   Final    BRONCHIAL ALVEOLAR LAVAGE Performed at [place] [address] [place], [place] [address]    Special Requests   Final    NONE Performed at [facility], [place] [address] [place], [place] [address]    Gram Stain   Final    NO WBC SEEN NO ORGANISMS SEEN Performed at [facility], [place] [address] [place], [place] [address]    Culture RARE STAPHYLOCOCCUS EPIDERMIDIS  Final   Report Status 03/18/2021 FINAL  Final   Organism ID, Bacteria STAPHYLOCOCCUS EPIDERMIDIS  Final      Susceptibility   Staphylococcus epidermidis - MIC*    CIPROFLOXACIN 4 RESISTANT Resistant     ERYTHROMYCIN >=8 RESISTANT Resistant     GENTAMICIN <=0.5 SENSITIVE Sensitive     OXACILLIN >=4 RESISTANT Resistant     TETRACYCLINE >=16 RESISTANT Resistant     VANCOMYCIN 1 SENSITIVE Sensitive     TRIMETH/SULFA 40 SENSITIVE Sensitive     CLINDAMYCIN >=8 RESISTANT Resistant     RIFAMPIN 2 INTERMEDIATE Intermediate     Inducible Clindamycin NEGATIVE Sensitive     * RARE STAPHYLOCOCCUS EPIDERMIDIS  Fungus Culture Result     Status: None   Collection Time: 03/16/21  9:41 AM  Result Value Ref Range Status   Result 1 Comment  Final    Comment: (NOTE) KOH/Calcofluor preparation:  no fungus observed. Performed At: [facility] [place], [place] [id number] Rush Farmer MD [id number]     Studies/Results: DG Chest Port 1 View  Result Date: 03/19/2021 CLINICAL DATA:  Respiratory failure. EXAM: PORTABLE CHEST 1 VIEW COMPARISON:  03/17/2021. FINDINGS: Tracheostomy tube tip projects at the tracheal air column at the level of the clavicular heads, similar to prior. Enteric tube courses below the diaphragm with the tip outside the  field of view. Right IJ central venous catheter is in similar position. Mild improvement in right suprahilar opacity with otherwise similar bilateral interstitial and airspace opacities. No visible pneumothorax or pleural effusions on this single semi erect AP radiograph. IMPRESSION: 1. Mild improvement in the right suprahilar opacities with otherwise similar bilateral interstitial and airspace opacities. 2. Similar position of support devices, as detailed above. 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 male with HIV and AIDS recently diagnosed and who is  responded well to integrase strand transfer based regimen with viral load dropping from 2,400,000 units to only 240 copies.  He has been admitted for respiratory failure and is being treated for presumed PCP pneumonia with clindamycin and primaclone.  He went bronchoscopy with BAL and also has now had tracheostomy.  He is required CVVHD.  He did grow Staph epidermidis on recent bronchoalveolar lavage and Zyvox was started for that.  #1 PCP pneumonia continue clindamycin primaclone and steroids (I wonder if much of his pathology is IRIS to PCP  #2 secondary bacterial infection initially had MSSA more more recently MRSE and is on Zyvox to complete a course of therapy  #3 acute renal injury on CVVHD  #4 HIV/AIDS : VL from 2.4  million to ND in 4 weeks, CD4 from  ND to [place] and [place] and could consider switch to components of [place]ov[place] if wanted to get TNF out of regimen, (no hep B or 184V)  I spent more than 35 minutes with the patient including face to face counseling of the patient personally reviewing radiographs, along with pertinent laboratory microbiological, virological data review of medical records before and during the visit and in coordination of his care.    LOS: 16 days   Alcide Evener 03/20/2021, 4:57 PM

## 2021-03-20 NOTE — Progress Notes (Signed)
eLink Physician-Brief Progress Note Patient Name: Bruce Hunter DOB: [birth year] MRN: [id number]   Date of Service  03/20/2021  HPI/Events of Note  Patient with recurrent urinary retention despite in / out catheterization x 2, he is reluctant to have a Foley catheter placed  now so we agreed that if a bladder scan at 2 AM  shows recurrence of urinary retention we will have it placed then to avoid damage to his kidneys from back pressure.  eICU Interventions  See above note.        Frederik Pear 03/20/2021, 11:33 PM

## 2021-03-20 NOTE — Progress Notes (Signed)
Pt tolerate well being on the 28% trach collar all night, no complaints or problems. Pt resting well

## 2021-03-20 NOTE — Progress Notes (Signed)
OVERNIGHT COVERAGE CRITICAL CARE PROGRESS NOTE  Patient seen in 2M05, transferred from [facility] to [facility] ICU for HD.  At the time of clinical interview, he is awake, alert, on trach collar.  Off pressors.  Off Precedex.  Follows commands, moves all 4 extremities.  Coarse breath sounds with rales and rhonchi.  Patient has been followed by Dr. Elsworth Soho, while at [facility].  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 CXR, Labs ordered for am. Nephrology to see in am.  Renee Pain, MD Board Certified by the ABIM, [place]

## 2021-03-20 NOTE — Progress Notes (Addendum)
NAME:  Bruce Hunter, MRN:  [id number], DOB:  1975-11-23, LOS: 5 ADMISSION DATE:  03/04/2021, CONSULTATION DATE:  6/30 REFERRING MD:  Melina Copa, CHIEF COMPLAINT:  Dyspnea   History of Present Illness:  45 y/o male with HIV/AIDS admitted with acute hypoxemic respiratory failure due to MSSA and presumed PJP pneumonia.   Prolonged ventilation requiring tracheostomy and AKI requiring CRRT  Pertinent  Medical History  Asthma DM2 Gout HTN Obesity GERD AKI Neuropathy  Significant Hospital Events: Including procedures, antibiotic start and stop dates in addition to other pertinent events   6/30 admitted for PNA with known HIV/AIDS 7/1 ETT >>>7/13 Echo 7/1 mild  RA dilation with RV low nl fxn mild PAS elevation and nl LV 7/1 - renal consult, ID consult 7/2 HD catheter >>> 7/5, Fi O2 50%, Pneumocystis DFA negative 03/06/21 ([facility]), MSSA on LRCx. Superficial LUE clot 7/6 - ETT tube advancec by CRNA 7/7 - CRRT stopped 7/8 - Expanding antibiotics to include CNS coverage including acyclovir.  7/9 LP. Febrile to 101 Fahrenheit.  White count 17.5 thousand and slightly better.  1PRBC. CRRT restarted. LP -opening pressure 38 cmH2O, cervical pressure 32 cm, clear colorless CSF, 91 mg percent glucose 0 RBC, 1 WBC, protein 30 mg/dL 7/10 continues on CRRT, MV, pressors 7/11 plt downtrending. Cont on MV CRRT NE. Weaning sedation, trial of vent wean x2 7/12 getting 1 PRBC. BAL -- sending for cx, fungal, PCP 7/13 tracheostomy BAL neg for PCP ag  7/15 CRRT off   Interim History / Subjective:   Critically ill, tolerated trach collar Afebrile 500 cc urine output CRRT turned off yesterday Blood pressure high yesterday, resume amlodipine and clonidine added  Objective   Blood pressure (!) 162/77, pulse 83, temperature 98.1 F (36.7 C), temperature source Axillary, resp. rate 19, height 5' 10.98" (1.803 m), weight 89.3 kg, SpO2 96 %.    Vent Mode: PRVC FiO2 (%):  [28 %-30 %] 28 % Set Rate:   [20 bmp] 20 bmp Vt Set:  [550 mL] 550 mL Plateau Pressure:  [18 cmH20] 18 cmH20   Intake/Output Summary (Last 24 hours) at 03/20/2021 0816 Last data filed at 03/20/2021 0522 Gross per 24 hour  Intake 471.88 ml  Output 1100 ml  Net -628.12 ml    Filed Weights   03/17/21 0500 03/18/21 0500 03/19/21 0500  Weight: 99.2 kg 93.8 kg 89.3 kg    Examination:  General: Critically ill, frail, in bed HENT: NCAT tracheostomy in place PULM: No accessory muscle use, decreased breath sounds bilaterally CV: RRR, no mgr GI: BS+, soft, nontender MSK: normal bulk and tone Neuro: Awake, follows one-step commands, RASS +1 on Precedex   Chest x-ray 7/15 independently reviewed, bilateral interstitial and alveolar opacities especially in the hilar regions, mild improvement in right suprahilar airspace disease  Labs show leukocytosis, low phosphorus, stable anemia and thrombocytopenia  BAL from 7/12 > neg DFA for PCP, PCR pending  EKG reviewed, no ST-T changes  Resolved Hospital Problem list    Severe sepsis due to multifocal pneumonia  Assessment & Plan:  Acute metabolic encephalopathy: Had very high sedation needs earlier in the admission Chronic pain Anxiety Substance abuse disorder  Mild intermittent agitation requiring Precedex back on, will attempt taper again today RASS target 0 to +1 7/15 Decreased clonazepam 0.5 twice daily and oxycodone to 5 mg every 12 with plan to gradually taper to off  Acute respiratory failure with hypoxemia due to MSSA pneumonia and presumed PCP: improving, completed course of cefazolin  for MSSA x 14 ds, MRSE on BAL 7/12 S/p tracheostomy  Tolerated trach collar close to 24 hours with minimal oxygen requirements VAP prevention Plan for Passy-Muir valve  Linezolid course per ID starting 7/14 Changed to prednisone 20 mg noted oxygen requirements have decreased Primaquin per ID, follow up PJP PCR (pending)    HIV/AIDS -CD4 has improved to 325 Continue  antiretrovirals as ordered  Hypertension -resumed amlodipine and clonidine added 7/15, titrate to effect  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 due to ATN, requiring CRRT; per renal will likely not recover without intermittent HD at least for the next few weeks  Increasing urine output is encouraging, monitor creatinine , expect to rise to baseline 2.5-3 Plan to transfer to [facility] for intermittent HD  Anemia Thrombocytopenia Monitor for bleeding Transfuse PRBC for Hgb < 7 gm/dL  Hyperglycemia, related to steroids -Added Lantus 10 units while on steroids , monitor CBG  Severe deconditioning -PT/OT ongoing, plan for SNF eventually when liberated  Best Practice (right click and "Reselect all SmartList Selections" daily)   Diet/type: tubefeeds DVT prophylaxis: prophylactic heparin  GI prophylaxis: N/A Lines: Dialysis Catheter Foley:  N/A Code Status:  full code Last date of multidisciplinary goals of care discussion [7/12: family desires full scope of practice, tracheostomy]  Labs   CBC: Recent Labs  Lab 03/15/21 0000 03/15/21 1220 03/16/21 0534 03/16/21 1300 03/17/21 0500 03/18/21 0254 03/19/21 0428  WBC 14.1*  --  11.0*  --  14.8* 15.5* 19.4*  NEUTROABS 10.2*  --   --   --   --   --  14.2*  HGB 7.1*  --  6.5* 7.3* 7.4* 7.4* 7.5*  HCT 24.2*  --  22.5* 24.8* 25.4* 25.8* 24.9*  MCV 97.6  --  98.3  --  96.9 98.5 98.0  PLT 133* 127* 157  --  201 182 169     Basic Metabolic Panel: Recent Labs  Lab 03/15/21 0453 03/15/21 1615 03/16/21 0534 03/16/21 1628 03/17/21 0500 03/17/21 1600 03/18/21 0254 03/18/21 1600 03/19/21 0428  NA 136   < > 138   < > 137 137 139 135 137  K 4.5   < > 5.1   < > 4.5 4.5 4.2 4.4 3.9  CL 103   < > 104   < > 104 102 104 102 101  CO2 26   < > 27   < > _0 GLUCOSE 151*   < > 196*   < > 172* 175* 214* 182* 215*  BUN 41*   < > 39*   < > 36* 42* 42* 43* 45*  CREATININE 1.86*  1.82*   < > 1.65*   < > 1.60* 1.78* 1.67* 1.69* 1.56*   CALCIUM 7.7*   < > 8.4*   < > 8.5* 8.6* 8.8* 8.9 8.9  MG 2.2  --  2.4  --  2.4  --  2.4  --  2.4  PHOS 3.0   < > 2.3*   < > 2.3* 3.3 3.1 2.9 2.2*   < > = values in this interval not displayed.    GFR: Estimated Creatinine Clearance: 64.4 mL/min (A) (by C-G formula based on SCr of 1.56 mg/dL (H)). Recent Labs  Lab 03/13/21 0914 03/13/21 0915 03/13/21 9983 03/14/21 0514 03/15/21 0000 03/15/21 0453 03/16/21 0534 03/17/21 0500 03/18/21 0254 03/19/21 0428  PROCALCITON  --  3.86  --  4.80  --  5.51  --   --   --   --  WBC  --   --    < > 13.1*   < >  --  11.0* 14.8* 15.5* 19.4*  LATICACIDVEN 1.2  --   --   --   --  1.0  --   --   --   --    < > = values in this interval not displayed.     Liver Function Tests: Recent Labs  Lab 03/13/21 1519 03/13/21 1730 03/14/21 0514 03/14/21 1625 03/15/21 1116 03/15/21 1615 03/17/21 0500 03/17/21 1600 03/18/21 0254 03/18/21 1600 03/19/21 0428  AST 26  --  24  --  30  --   --   --   --   --  41  ALT 6  --  5  --  7  --   --   --   --   --  13  ALKPHOS 91  --  91  --  144*  --   --   --   --   --  90  BILITOT 0.6  --  0.6  --  0.4  --   --   --   --   --  0.6  PROT 6.7  --  6.5  --  6.7  --   --   --   --   --  7.5  ALBUMIN 1.8*  1.8*   < > 1.8*  1.8*   < > 1.7*   < > 1.7* 1.8* 1.9* 2.1* 2.1*   < > = values in this interval not displayed.    No results for input(s): LIPASE, AMYLASE in the last 168 hours. No results for input(s): AMMONIA in the last 168 hours.  ABG    Component Value Date/Time   PHART 7.274 (L) 03/13/2021 1203   PCO2ART 46.1 03/13/2021 1203   PO2ART 96.8 03/13/2021 1203   HCO3 20.7 03/13/2021 1203   TCO2 27 02/26/2020 0635   ACIDBASEDEF 5.2 (H) 03/13/2021 1203   O2SAT 96.3 03/13/2021 1203     Coagulation Profile: Recent Labs  Lab 03/13/21 0943 03/15/21 1220  INR 1.1 1.0     Cardiac Enzymes: Recent Labs  Lab 03/13/21 0914  CKTOTAL 266  CKMB 2.1     HbA1C: Hgb A1c MFr Bld  Date/Time  Value Ref Range Status  03/06/2021 10:31 AM 5.4 4.8 - 5.6 % Final    Comment:    (NOTE)         Prediabetes: 5.7 - 6.4         Diabetes: >6.4         Glycemic control for adults with diabetes: <7.0   02/12/2021 03:22 AM 5.9 (H) 4.8 - 5.6 % Final    Comment:    (NOTE)         Prediabetes: 5.7 - 6.4         Diabetes: >6.4         Glycemic control for adults with diabetes: <7.0     CBG: Recent Labs  Lab 03/19/21 1600 03/19/21 2020 03/19/21 2339 03/20/21 0344 03/20/21 0756  GLUCAP 265* 211* 172* 234* 215*     Critical care time: 33 minutes    Kara Mead MD. FCCP. [facility] Pager : [phone]  If no response to pager , please call [phone] until 7 pm After 7:00 pm call [place]  [phone]   03/20/2021

## 2021-03-20 NOTE — Progress Notes (Signed)
Nephrology Follow-Up Consult note   Assessment/Recommendations: Bruce Hunter is a/an 45 y.o. male with a past medical history significant for HIV/AIDS, CKD, asthma, obesity, DM2 admitted for acute hypoxic respiratory failure with sepsis.      Subjective:  remains off pressors, net UF 5 L yesterday.   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  CKD with baseline of 2.5-3.0 range.  Did have some urinary retention so Foley catheter placed. CRRT started on 7/02, held on 7/7 and 7/8 and resumed on 7/09. CRRT now is dc'd (on 7/15) and awaiting pt transfer to [facility] for iHD.  Also has started making some urine , so watch for signs of recovery.  BP/volume - peak wt was 11 kg up, now is 1kg under admit wt. No gross edema on exam.  Multifocal PNA - ID suspected PCP + secondary MSSA infection. CXR's most cleared w/ some residual RUL infiltrates HTN - started on po / NG BP lowering meds w/ norvasc and clonidine AHRF: sp trach, vent dependent still HIV: ID managing DM2: Insulin management per primary Anemia macrocytic. Likely multifactorial with infection contributing. Transfuse PRBC's prn. Started darbe 40 ug per wk.   Kelly Splinter, MD 03/20/2021, 1:29 PM       Medications:  Current Facility-Administered Medications  Medication Dose Route Frequency Provider Last Rate Last Admin   0.9 %  sodium chloride infusion (Manually program via Guardrails IV Fluids)   Intravenous Once Brand Males, MD       0.9 %  sodium chloride infusion  250 mL Intravenous Continuous Candee Furbish, MD 10 mL/hr at 03/10/21 0111 Infusion Verify at 03/10/21 0111   0.9 %  sodium chloride infusion  250 mL Intravenous Continuous Anders Simmonds, MD   Stopped at 03/19/21 2158   acetaminophen (TYLENOL) 160 MG/5ML solution 650 mg  650 mg Per Tube Q6H PRN Tanda Rockers, MD   650 mg at 03/20/21 0924   albuterol (PROVENTIL) (2.5 MG/3ML) 0.083% nebulizer solution 2.5 mg  2.5  mg Nebulization Q2H PRN Collier Bullock, MD       alteplase (CATHFLO ACTIVASE) injection 2 mg  2 mg Intracatheter Once Tanda Rockers, MD       alteplase (CATHFLO ACTIVASE) injection 2 mg  2 mg Intracatheter Once Roney Jaffe, MD       amLODipine (NORVASC) tablet 10 mg  10 mg Per Tube Daily Rigoberto Noel, MD   10 mg at 03/20/21 0924   chlorhexidine gluconate (MEDLINE KIT) (PERIDEX) 0.12 % solution 15 mL  15 mL Mouth Rinse BID Candee Furbish, MD   15 mL at 03/20/21 0856   Chlorhexidine Gluconate Cloth 2 % PADS 6 each  6 each Topical Daily Brand Males, MD   6 each at 03/20/21 0523   clindamycin (CLEOCIN) IVPB 900 mg  900 mg Intravenous Eldred Manges, MD 100 mL/hr at 03/20/21 1306 900 mg at 03/20/21 1306   clonazePAM (KLONOPIN) tablet 0.5 mg  0.5 mg Per Tube BID Rigoberto Noel, MD   0.5 mg at 03/20/21 0924   cloNIDine (CATAPRES) tablet 0.1 mg  0.1 mg Per Tube BID Simonne Maffucci B, MD   0.1 mg at 03/20/21 5638   Darbepoetin Alfa (ARANESP) injection 40 mcg  40 mcg Subcutaneous Q Sun-1800 Claudia Desanctis, MD   40 mcg at 03/14/21 1817   dexmedetomidine (PRECEDEX) 400 MCG/100ML (4 mcg/mL) infusion  0.4-1.2 mcg/kg/hr Intravenous Titrated Juanito Doom, MD 19 mL/hr  at 03/20/21 1100 0.8 mcg/kg/hr at 03/20/21 1100   docusate (COLACE) 50 MG/5ML liquid 100 mg  100 mg Per Tube BID PRN Tanda Rockers, MD   100 mg at 03/08/21 1358   dolutegravir (TIVICAY) tablet 50 mg  50 mg Per Tube Daily Tanda Rockers, MD   50 mg at 03/20/21 6767   emtricitabine-tenofovir AF (DESCOVY) 200-25 MG per tablet 1 tablet  1 tablet Per Tube Daily Carlyle Basques, MD   1 tablet at 03/20/21 0926   feeding supplement (PIVOT 1.5 CAL) liquid 1,000 mL  1,000 mL Per Tube Q24H Rigoberto Noel, MD   Paused at 03/20/21 0745   fentaNYL (SUBLIMAZE) injection 25-100 mcg  25-100 mcg Intravenous Q2H PRN Simonne Maffucci B, MD   100 mcg at 03/20/21 0456   gabapentin (NEURONTIN) 250 MG/5ML solution 100 mg  100 mg Per Tube Q12H  Bowser, Laurel Dimmer, NP   100 mg at 03/20/21 0925   heparin injection 1,000-6,000 Units  1,000-6,000 Units CRRT PRN Roney Jaffe, MD   2,400 Units at 03/19/21 0943   heparin injection 5,000 Units  5,000 Units Subcutaneous Q8H Simonne Maffucci B, MD   5,000 Units at 03/20/21 1306   insulin aspart (novoLOG) injection 0-15 Units  0-15 Units Subcutaneous Q4H Ollis, Brandi L, NP   3 Units at 03/20/21 1234   insulin glargine (LANTUS) injection 10 Units  10 Units Subcutaneous Daily Rigoberto Noel, MD   10 Units at 03/20/21 0925   ipratropium-albuterol (DUONEB) 0.5-2.5 (3) MG/3ML nebulizer solution 3 mL  3 mL Nebulization Q4H Tyna Jaksch, MD   3 mL at 03/20/21 1117   labetalol (NORMODYNE) injection 10-20 mg  10-20 mg Intravenous Q2H PRN Rigoberto Noel, MD   20 mg at 03/19/21 1658   lidocaine (LIDODERM) 5 % 1 patch  1 patch Transdermal Q24H Simonne Maffucci B, MD   1 patch at 03/19/21 1904   linezolid (ZYVOX) tablet 600 mg  600 mg Per Tube Kateri Mc, MD   600 mg at 03/20/21 2094   MEDLINE mouth rinse  15 mL Mouth Rinse 10 times per day Candee Furbish, MD   15 mL at 03/20/21 1235   midazolam (VERSED) injection 1-2 mg  1-2 mg Intravenous Q2H PRN Cristal Generous, NP   2 mg at 03/19/21 1815   ondansetron (ZOFRAN) injection 4 mg  4 mg Intravenous Q6H PRN Collier Bullock, MD       oxyCODONE (ROXICODONE) 5 MG/5ML solution 5 mg  5 mg Per Tube Q12H Kara Mead V, MD   5 mg at 03/20/21 0501   pantoprazole sodium (PROTONIX) 40 mg/20 mL oral suspension 40 mg  40 mg Per Tube QHS Leodis Sias T, RPH   40 mg at 03/19/21 2155   polyethylene glycol (MIRALAX / GLYCOLAX) packet 17 g  17 g Per Tube Daily PRN Tanda Rockers, MD       predniSONE 5 MG/ML concentrated solution 20 mg  20 mg Per Tube Q breakfast Kara Mead V, MD   20 mg at 03/20/21 0925   primaquine tablet 30 mg  30 mg Per Tube Daily Simonne Maffucci B, MD   30 mg at 03/20/21 0926   sodium chloride flush (NS) 0.9 % injection 10-40 mL  10-40  mL Intracatheter Q12H Tanda Rockers, MD   10 mL at 03/20/21 1235   sodium chloride flush (NS) 0.9 % injection 10-40 mL  10-40 mL Intracatheter PRN Tanda Rockers, MD  Physical Exam: Vitals:   03/20/21 1148 03/20/21 1200  BP:  (!) 144/60  Pulse:  72  Resp:  (!) 30  Temp: 97.9 F (36.6 C)   SpO2:  97%   Total I/O In: 226.9 [I.V.:176.9; IV Piggyback:50] Out: -   Intake/Output Summary (Last 24 hours) at 03/20/2021 1329 Last data filed at 03/20/2021 1100 Gross per 24 hour  Intake 625.91 ml  Output 850 ml  Net -224.09 ml    General adult male on vent w/ trach   HEENT normocephalic atraumatic  Neck supple trachea midline, trach+ Lungs coarse mechanical breath sounds  Heart S1S2 no rub Abdomen soft nontender with limits of sedation distended Extremities minimal LE edema Neuro sedation running Access: RIJ nontunneled catheter in place   Test Results I personally reviewed new and old clinical labs and radiology tests Lab Results  Component Value Date   NA 139 03/20/2021   K 3.9 03/20/2021   CL 104 03/20/2021   CO2 23 03/20/2021   BUN 97 (H) 03/20/2021   CREATININE 2.79 (H) 03/20/2021   CALCIUM 9.0 03/20/2021   ALBUMIN 2.1 (L) 03/19/2021   PHOS 2.2 (L) 03/19/2021     [name], MD 03/20/2021 1:29 PM

## 2021-03-20 NOTE — Progress Notes (Signed)
Occupational Therapy Treatment Patient Details Name: ARIHAAN PYATT MRN: [id number] DOB: 1976/06/14 Today's Date: 03/20/2021    History of present illness ZACKARIYA BIHL is a/an 45 y.o. male with a past medical history significant for HIV/AIDS, CKD, asthma, obesity, DM2 admitted for acute hypoxic respiratory failure with sepsis, requiring ventilator support, CRRT, S/P tracheostomy 03/17/21.   OT comments  Treatment focused on upper extremity exercises and movement to improve Rom and strength needed for Adls and functional mobility. Patient able to follow commands and perform UE exercises with fast and somewhat floppy movement. Patient needed active assist to raise arms. Patient able to throw squeeze ball at target with each arm - using mostly elbow movement. Patient able to follow commands. He was able to point to paper and pen and point to previous written word and request Diet Pepsi. Patient's alertness and participation improving. Cont POC.   Follow Up Recommendations  LTACH;SNF    Equipment Recommendations  Tub/shower seat    Recommendations for Other Services      Precautions / Restrictions Precautions Precautions: Fall Precaution Comments: NG tube, Trach, both feet are painful PTA per friend. Restrictions Weight Bearing Restrictions: No        Praxis      Cognition Arousal/Alertness: Awake/alert Behavior During Therapy: Flat affect Overall Cognitive Status: Difficult to assess                                 General Comments: Able to follow all commands, guesture to pen and paper and point to word Diet Pepsi.        Exercises Other Exercises Other Exercises: Fist Pumps with ball x 10 each hand, wrist extension x 10 each hand, forearm pronation x 10 each arm, elbow flexion x 10 each arm, shoulder flexion with active assist x 10 each arm Other Exercises: Throwing sqeeze ball at target 6-7 x each hand.   Shoulder Instructions       General Comments       Pertinent Vitals/ Pain   Frequency  Min 2X/week        Progress Toward Goals  OT Goals(current goals can now be found in the care plan section)  Progress towards OT goals: Progressing toward goals  Acute Rehab OT Goals Patient Stated Goal: to get stronger OT Goal Formulation: With family Time For Goal Achievement: 04/01/21 Potential to Achieve Goals: Good  Plan Discharge plan remains appropriate    Co-evaluation          OT goals addressed during session: Strengthening/ROM      AM-PAC OT "6 Clicks" Daily Activity     Outcome Measure   Help from another person eating meals?: Total Help from another person taking care of personal grooming?: Total Help from another person toileting, which includes using toliet, bedpan, or urinal?: Total Help from another person bathing (including washing, rinsing, drying)?: Total Help from another person to put on and taking off regular upper body clothing?: Total Help from another person to put on and taking off regular lower body clothing?: Total 6 Click Score: 6    End of Session    OT Visit Diagnosis: Other abnormalities of gait and mobility (R26.89);Muscle weakness (generalized) (M62.81);Pain   Activity Tolerance Patient tolerated treatment well   Patient Left in bed;with call bell/phone within reach   Nurse Communication Mobility status (okay to see, feeding tube leaking)        Time: [id number] OT  Time Calculation (min): 23 min  Charges: OT General Charges $OT Visit: 1 Visit OT Treatments $Therapeutic Exercise: 23-37 mins  Sanjiv Castorena, OTR/L [place]  Office [phone] Pager: [place] 03/20/2021, 12:16 PM

## 2021-03-21 ENCOUNTER — Inpatient Hospital Stay (HOSPITAL_COMMUNITY): Payer: Medicaid Other

## 2021-03-21 DIAGNOSIS — G9341 Metabolic encephalopathy: Secondary | ICD-10-CM | POA: Diagnosis not present

## 2021-03-21 DIAGNOSIS — N179 Acute kidney failure, unspecified: Secondary | ICD-10-CM | POA: Diagnosis not present

## 2021-03-21 DIAGNOSIS — Z930 Tracheostomy status: Secondary | ICD-10-CM | POA: Diagnosis not present

## 2021-03-21 DIAGNOSIS — J189 Pneumonia, unspecified organism: Secondary | ICD-10-CM | POA: Diagnosis not present

## 2021-03-21 DIAGNOSIS — J9601 Acute respiratory failure with hypoxia: Secondary | ICD-10-CM | POA: Diagnosis not present

## 2021-03-21 LAB — RENAL FUNCTION PANEL
Albumin: 1.9 g/dL — ABNORMAL LOW (ref 3.5–5.0)
Anion gap: 12 (ref 5–15)
BUN: 111 mg/dL — ABNORMAL HIGH (ref 6–20)
CO2: 21 mmol/L — ABNORMAL LOW (ref 22–32)
Calcium: 8.8 mg/dL — ABNORMAL LOW (ref 8.9–10.3)
Chloride: 103 mmol/L (ref 98–111)
Creatinine, Ser: 2.9 mg/dL — ABNORMAL HIGH (ref 0.61–1.24)
GFR, Estimated: 27 mL/min — ABNORMAL LOW (ref >60)
Glucose, Bld: 235 mg/dL — ABNORMAL HIGH (ref 70–99)
Phosphorus: 7.3 mg/dL — ABNORMAL HIGH (ref 2.5–4.6)
Potassium: 4 mmol/L (ref 3.5–5.1)
Sodium: 136 mmol/L (ref 135–145)

## 2021-03-21 LAB — APTT: aPTT: 32 seconds (ref 24–36)

## 2021-03-21 LAB — CBC
HCT: 24.2 % — ABNORMAL LOW (ref 39.0–52.0)
Hemoglobin: 7.2 g/dL — ABNORMAL LOW (ref 13.0–17.0)
MCH: 28.8 pg (ref 26.0–34.0)
MCHC: 29.8 g/dL — ABNORMAL LOW (ref 30.0–36.0)
MCV: 96.8 fL (ref 80.0–100.0)
Platelets: 208 10*3/uL (ref 150–400)
RBC: 2.5 MIL/uL — ABNORMAL LOW (ref 4.22–5.81)
RDW: 22.9 % — ABNORMAL HIGH (ref 11.5–15.5)
WBC: 18.1 10*3/uL — ABNORMAL HIGH (ref 4.0–10.5)
nRBC: 0 % (ref 0.0–0.2)

## 2021-03-21 LAB — GLUCOSE, CAPILLARY
Glucose-Capillary: 178 mg/dL — ABNORMAL HIGH (ref 70–99)
Glucose-Capillary: 226 mg/dL — ABNORMAL HIGH (ref 70–99)
Glucose-Capillary: 179 mg/dL — ABNORMAL HIGH (ref 70–99)
Glucose-Capillary: 187 mg/dL — ABNORMAL HIGH (ref 70–99)
Glucose-Capillary: 204 mg/dL — ABNORMAL HIGH (ref 70–99)
Glucose-Capillary: 166 mg/dL — ABNORMAL HIGH (ref 70–99)

## 2021-03-21 LAB — PROTIME-INR
INR: 1.1 (ref 0.8–1.2)
Prothrombin Time: 13.8 seconds (ref 11.4–15.2)

## 2021-03-21 IMAGING — DX DG CHEST 1V PORT
2 series · 2 of 2 positions shown · non-contrast
Comparison: Chest x-ray [DATE].

CLINICAL DATA: 44-year-old male with history of acute hypoxic
respiratory failure.

EXAM:
PORTABLE CHEST 1 VIEW

[chest ap (1 of 2)]
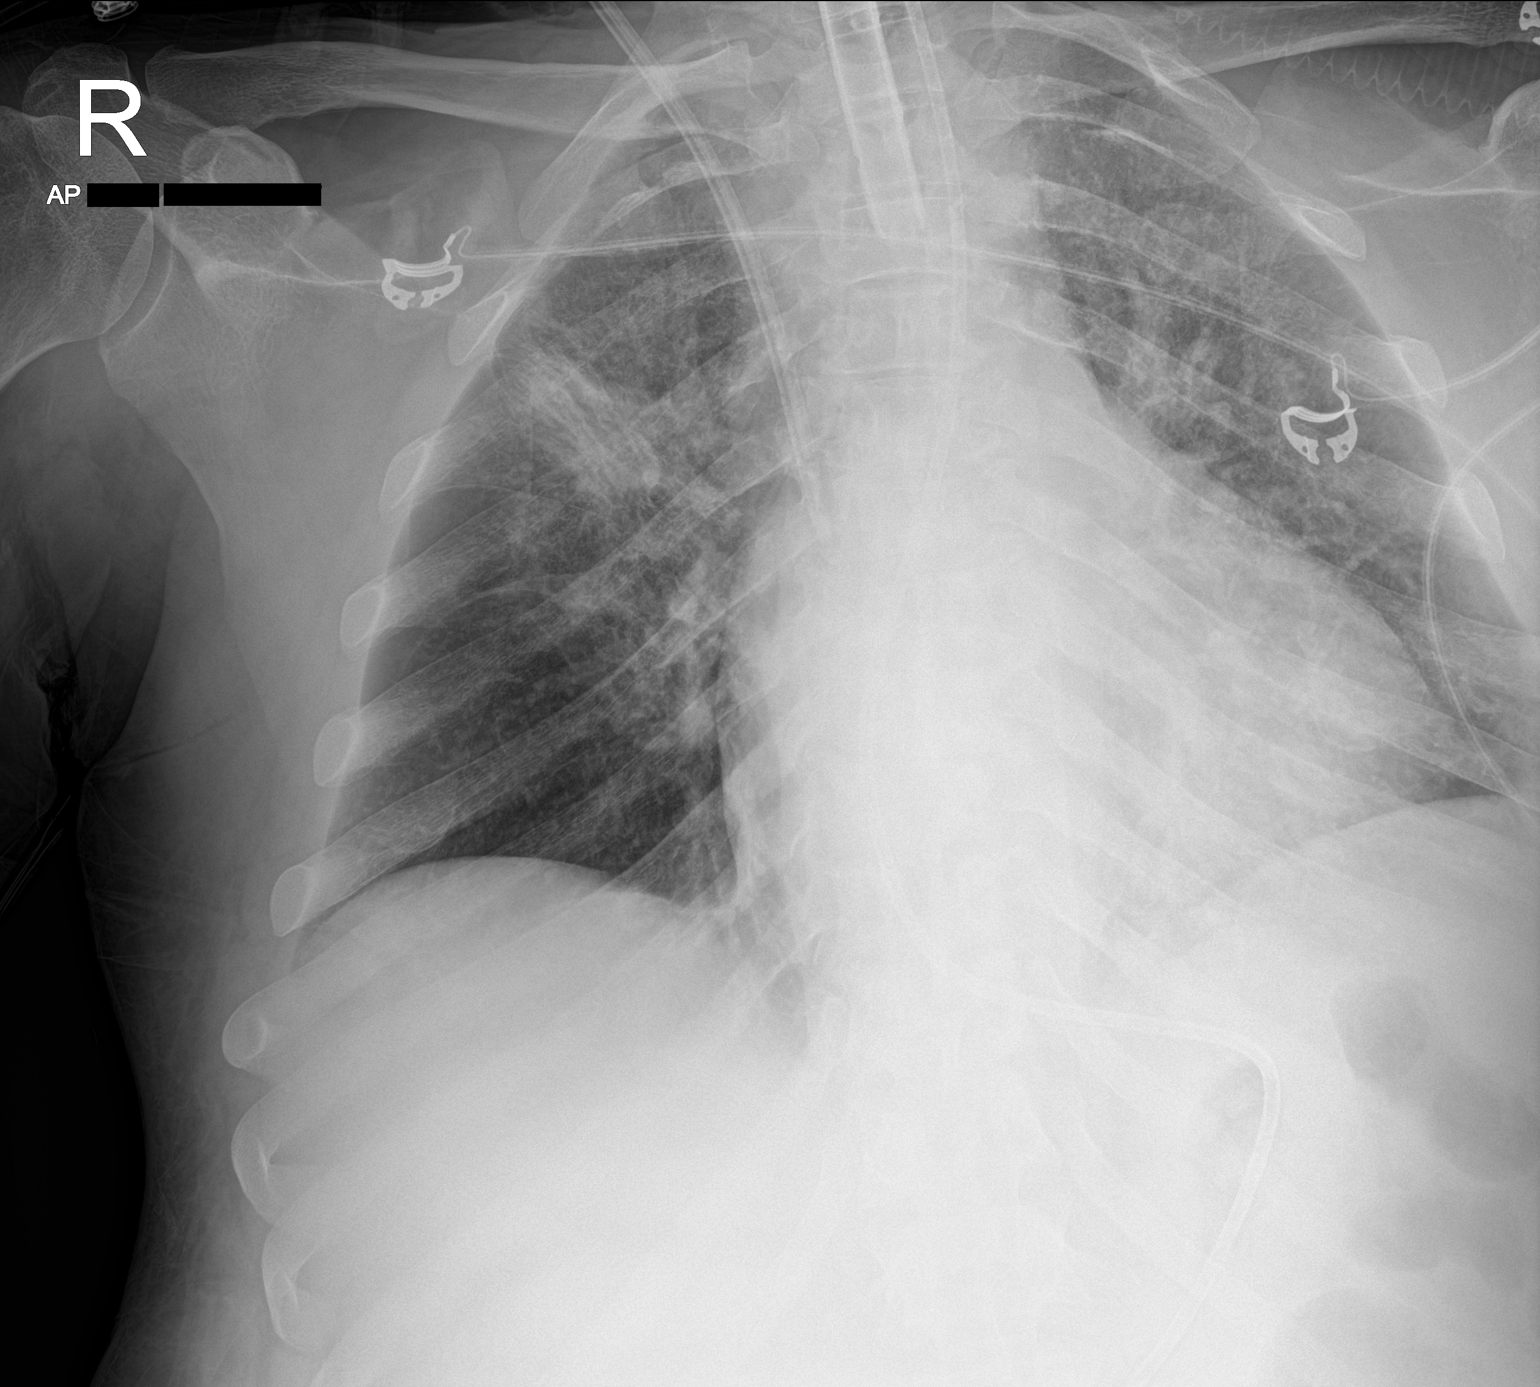

[chest ap (2 of 2)]
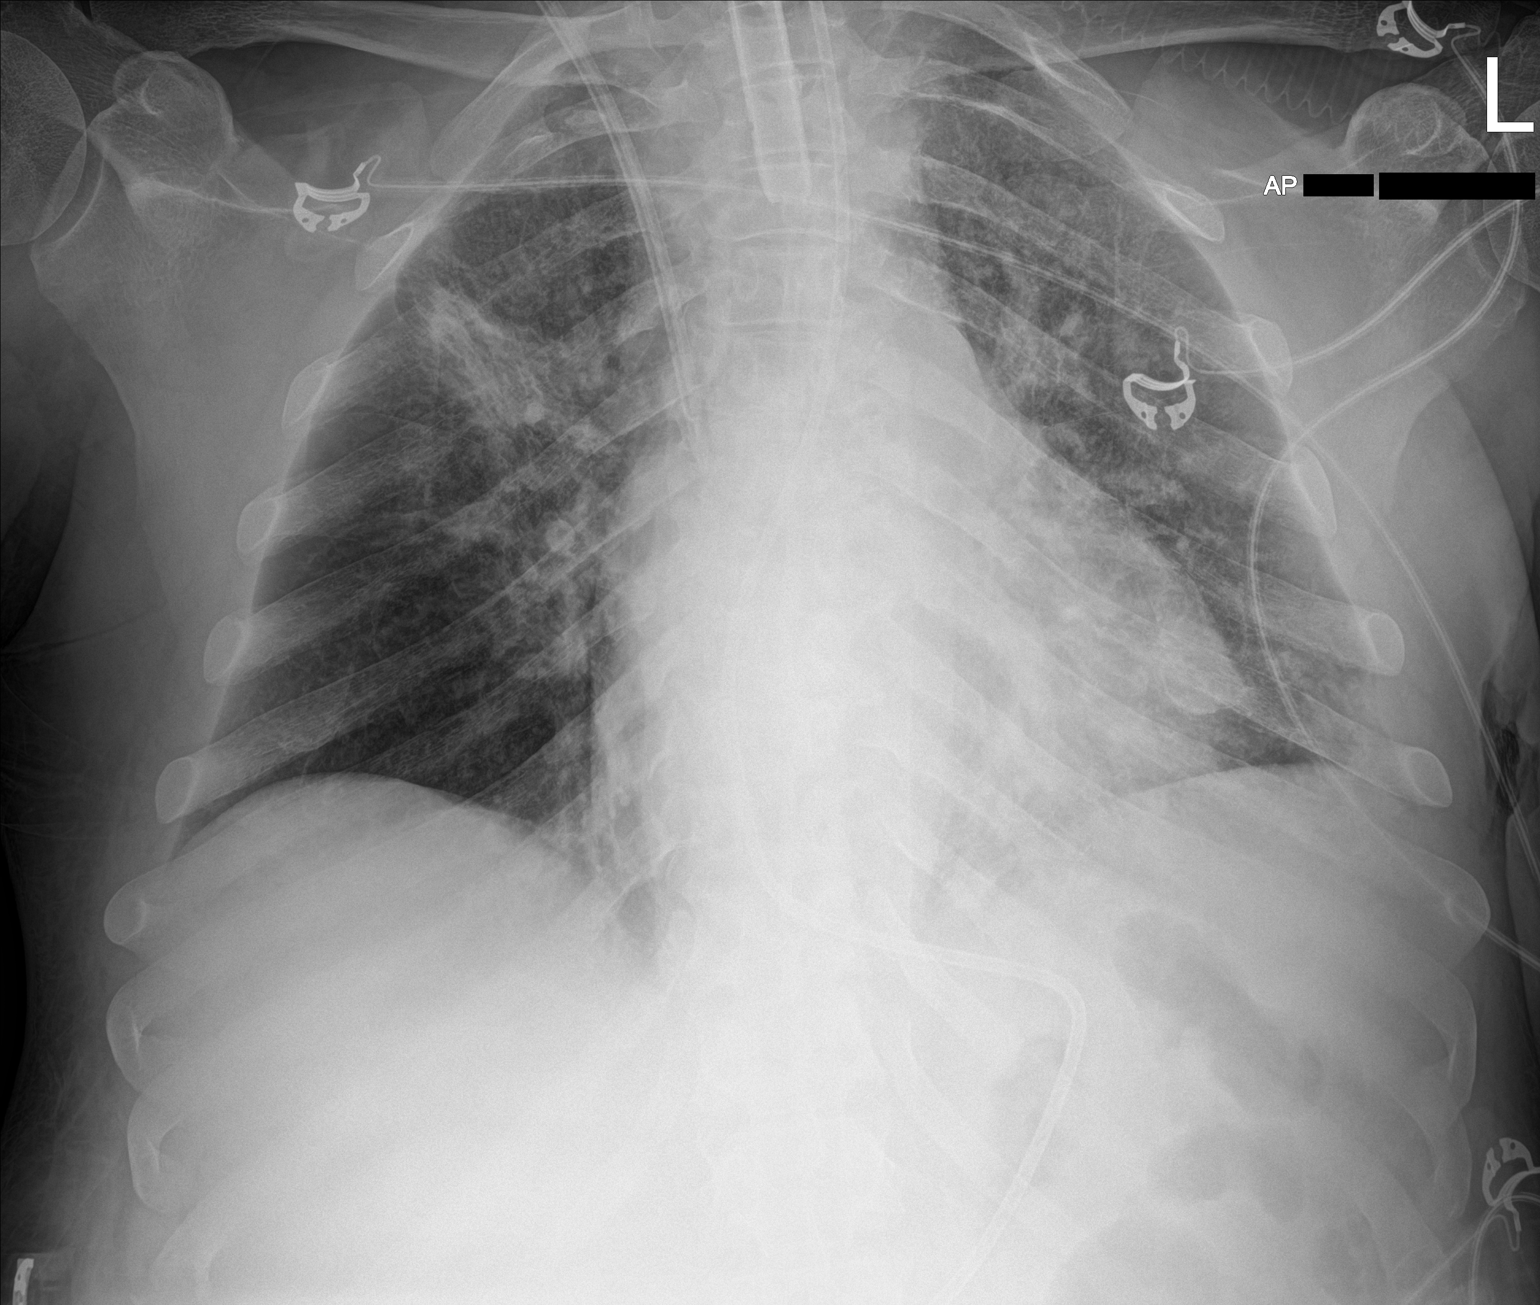

[2 of 2 positions shown; findings below may reference images not displayed]

FINDINGS: There is a right-sided internal jugular central venous catheter with
tip terminating in the distal superior vena cava. A feeding tube is
seen extending into the abdomen, however, the tip of the feeding
tube extends below the lower margin of the image. Lung volumes are
low. Patchy ill-defined opacities and widespread areas of
interstitial prominence are again noted throughout the lungs
bilaterally with relative sparing in the right lung base. Overall,
aeration appears slightly worsened than examination from 2 days ago.
No pleural effusions. No pneumothorax. Pulmonary vasculature is
largely obscured. Mild cardiomegaly. Upper mediastinal contours are
within normal limits.
IMPRESSION: 1. Support apparatus, as above.
2. Slightly worsened aeration in the chest compared to 2 days ago.
Overall, findings are favored to reflect ARDS.

## 2021-03-21 MED ORDER — LAMIVUDINE 10 MG/ML PO SOLN
300.0000 mg | Freq: Every day | ORAL | Status: DC
  Administered 2021-03-21: 300 mg
  Filled 2021-03-21 (×2): qty 30

## 2021-03-21 MED ORDER — SODIUM CHLORIDE 0.9 % IV SOLN: INTRAVENOUS | Status: DC

## 2021-03-21 NOTE — Progress Notes (Signed)
NAME:  Bruce Hunter, MRN:  [id number], DOB:  [birth year], LOS: 59 ADMISSION DATE:  03/04/2021, CONSULTATION DATE:  6/30 REFERRING MD:  Melina Copa, CHIEF COMPLAINT:  Dyspnea   History of Present Illness:  45 y/o male with HIV/AIDS admitted with acute hypoxemic respiratory failure due to MSSA and presumed PJP pneumonia.   Prolonged ventilation requiring tracheostomy and AKI requiring CRRT  Pertinent  Medical History  Asthma DM2 Gout HTN Obesity GERD AKI Neuropathy  Significant Hospital Events: Including procedures, antibiotic start and stop dates in addition to other pertinent events   6/30 admitted for PNA with known HIV/AIDS 7/1 ETT >>>7/13 Echo 7/1 mild  RA dilation with RV low nl fxn mild PAS elevation and nl LV 7/1 - renal consult, ID consult 7/2 HD catheter >>> 7/5, Fi O2 50%, Pneumocystis DFA negative 03/06/21 ([facility]), MSSA on LRCx. Superficial LUE clot 7/6 - ETT tube advancec by CRNA 7/7 - CRRT stopped 7/8 - Expanding antibiotics to include CNS coverage including acyclovir.  7/9 LP. Febrile to 101 Fahrenheit.  White count 17.5 thousand and slightly better.  1PRBC. CRRT restarted. LP -opening pressure 38 cmH2O, cervical pressure 32 cm, clear colorless CSF, 91 mg percent glucose 0 RBC, 1 WBC, protein 30 mg/dL 7/10 continues on CRRT, MV, pressors 7/11 plt downtrending. Cont on MV CRRT NE. Weaning sedation, trial of vent wean x2 7/12 getting 1 PRBC. BAL -- sending for cx, fungal, PCP 7/13 tracheostomy BAL neg for PCP ag  7/15 CRRT off   Interim History / Subjective:  Transferred to [facility] overnight  Tolerating trach collar >48 hours Denies shortness of breath, chest pain Precedex restarted overnight for agitation  Objective   Blood pressure 140/82, pulse 94, temperature 98.2 F (36.8 C), temperature source Oral, resp. rate (!) 25, height 5' 10.98" (1.803 m), weight 89.3 kg, SpO2 97 %.    FiO2 (%):  [28 %] 28 %   Intake/Output Summary (Last 24 hours) at 03/21/2021  0857 Last data filed at 03/21/2021 0800 Gross per 24 hour  Intake 486.97 ml  Output 2175 ml  Net -1688.03 ml   Filed Weights   03/17/21 0500 03/18/21 0500 03/19/21 0500  Weight: 99.2 kg 93.8 kg 89.3 kg   Physical Exam: General: Critically ill-appearing, no acute distress HENT: [place], AT, OP clear, MMM Neck: Trach in place, c/d/i Eyes: EOMI, no scleral icterus Respiratory: Diminished but clear to auscultation bilaterally.  No crackles, wheezing or rales Cardiovascular: RRR, -M/R/G, no JVD GI: BS+, soft, nontender Extremities:-Edema,-tenderness Neuro:  Awake, alert, CNII-XII grossly intact, moves extremities x 4 GU: Foley in place  Chest x-ray 7/15 independently reviewed, bilateral interstitial and alveolar opacities especially in the hilar regions, mild improvement in right suprahilar airspace disease  Labs show leukocytosis, stable anemia, resolved thrombocytopenia  Sputum DFA 7/2 Neg Respiratory culture 7/2 S. aureus BAL from 7/12 > neg DFA for PCP, PCR pending .  CSF cultures fungal, viral NTD  Respiratory culture 7/12 S. Epidermidis sensitive to Vanc and [facility] Problem list    Severe sepsis due to multifocal pneumonia  Assessment & Plan:  Acute metabolic encephalopathy: Had very high sedation needs earlier in the admission Chronic pain Anxiety Substance abuse disorder Continues to intermittently require Precedex On clonidine taper to facilitate weaning of Precedex RASS target 0 to +1 Continue clonazepam 0.5 twice daily and oxycodone to 5 mg every 12 with plan to gradually taper to off  Acute respiratory failure with hypoxemia due to MSSA pneumonia: improving, completed course  of cefazolin for MSSA x 14 ds, MRSE on BAL 7/12 PJP ag and PCR neg S/p tracheostomy Tolerated trach collar close to 48 hours with minimal oxygen requirements VAP prevention Speech consulted for PMV  Linezolid, clinda and prednisone per ID  HIV/AIDS -CD4 has improved to  325 Continue antiretrovirals as ordered  Hypertension -resumed amlodipine and clonidine added 7/15, titrate to effect  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 due to ATN, requiring CRRT; per renal will likely not recover without intermittent HD at least for the next few weeks UOP 1.3L in last 24 hours iHD per Nephrology Trend BMET  Anemia Thrombocytopenia Monitor for bleeding Transfuse PRBC for Hgb < 7 gm/dL  Hyperglycemia, related to steroids -Lantus 10 units while on steroids , monitor CBG  Severe deconditioning -PT/OT ongoing, plan for SNF eventually when liberated  Best Practice (right click and "Reselect all SmartList Selections" daily)   Diet/type: tubefeeds DVT prophylaxis: prophylactic heparin  GI prophylaxis: N/A Lines: Dialysis Catheter Foley:  N/A Code Status:  full code Last date of multidisciplinary goals of care discussion [7/12: family desires full scope of practice, tracheostomy]  Labs   CBC: Recent Labs  Lab 03/15/21 0000 03/15/21 1220 03/17/21 0500 03/18/21 0254 03/19/21 0428 03/20/21 1026 03/21/21 0451  WBC 14.1*   < > 14.8* 15.5* 19.4* 20.3* 18.1*  NEUTROABS 10.2*  --   --   --  14.2*  --   --   HGB 7.1*   < > 7.4* 7.4* 7.5* 7.0* 7.2*  HCT 24.2*   < > 25.4* 25.8* 24.9* 24.0* 24.2*  MCV 97.6   < > 96.9 98.5 98.0 98.4 96.8  PLT 133*   < > 201 182 169 171 208   < > = values in this interval not displayed.    Basic Metabolic Panel: Recent Labs  Lab 03/15/21 0453 03/15/21 1615 03/16/21 0534 03/16/21 1628 03/17/21 0500 03/17/21 1600 03/18/21 0254 03/18/21 1600 03/19/21 0428 03/20/21 1026 03/21/21 0451  NA 136   < > 138   < > 137 137 139 135 137 139 136  K 4.5   < > 5.1   < > 4.5 4.5 4.2 4.4 3.9 3.9 4.0  CL 103   < > 104   < > 104 102 104 102 101 104 103  CO2 26   < > 27   < > _0 21*  GLUCOSE 151*   < > 196*   < > 172* 175* 214* 182* 215* 209* 235*  BUN 41*   < > 39*   < > 36* 42* 42* 43* 45* 97* 111*  CREATININE 1.86*  1.82*    < > 1.65*   < > 1.60* 1.78* 1.67* 1.69* 1.56* 2.79* 2.90*  CALCIUM 7.7*   < > 8.4*   < > 8.5* 8.6* 8.8* 8.9 8.9 9.0 8.8*  MG 2.2  --  2.4  --  2.4  --  2.4  --  2.4  --   --   PHOS 3.0   < > 2.3*   < > 2.3* 3.3 3.1 2.9 2.2*  --  7.3*   < > = values in this interval not displayed.   GFR: Estimated Creatinine Clearance: 34.6 mL/min (A) (by C-G formula based on SCr of 2.9 mg/dL (H)). Recent Labs  Lab 03/15/21 0453 03/16/21 0534 03/18/21 0254 03/19/21 0428 03/20/21 1026 03/21/21 0451  PROCALCITON 5.51  --   --   --   --   --  WBC  --    < > 15.5* 19.4* 20.3* 18.1*  LATICACIDVEN 1.0  --   --   --   --   --    < > = values in this interval not displayed.    Liver Function Tests: Recent Labs  Lab 03/15/21 1116 03/15/21 1615 03/17/21 1600 03/18/21 0254 03/18/21 1600 03/19/21 0428 03/21/21 0451  AST 30  --   --   --   --  41  --   ALT 7  --   --   --   --  13  --   ALKPHOS 144*  --   --   --   --  90  --   BILITOT 0.4  --   --   --   --  0.6  --   PROT 6.7  --   --   --   --  7.5  --   ALBUMIN 1.7*   < > 1.8* 1.9* 2.1* 2.1* 1.9*   < > = values in this interval not displayed.   No results for input(s): LIPASE, AMYLASE in the last 168 hours. No results for input(s): AMMONIA in the last 168 hours.  ABG    Component Value Date/Time   PHART 7.274 (L) 03/13/2021 1203   PCO2ART 46.1 03/13/2021 1203   PO2ART 96.8 03/13/2021 1203   HCO3 20.7 03/13/2021 1203   TCO2 27 02/26/2020 0635   ACIDBASEDEF 5.2 (H) 03/13/2021 1203   O2SAT 96.3 03/13/2021 1203     Coagulation Profile: Recent Labs  Lab 03/15/21 1220 03/21/21 0451  INR 1.0 1.1    Cardiac Enzymes: No results for input(s): CKTOTAL, CKMB, CKMBINDEX, TROPONINI in the last 168 hours.  HbA1C: Hgb A1c MFr Bld  Date/Time Value Ref Range Status  03/06/2021 10:31 AM 5.4 4.8 - 5.6 % Final    Comment:    (NOTE)         Prediabetes: 5.7 - 6.4         Diabetes: >6.4         Glycemic control for adults with diabetes:  <7.0   02/12/2021 03:22 AM 5.9 (H) 4.8 - 5.6 % Final    Comment:    (NOTE)         Prediabetes: 5.7 - 6.4         Diabetes: >6.4         Glycemic control for adults with diabetes: <7.0     CBG: Recent Labs  Lab 03/20/21 1632 03/20/21 1932 03/20/21 2200 03/21/21 0324 03/21/21 0717  GLUCAP 177* 153* 124* 178* 226*    Critical care time: 31 minutes    The patient is critically ill with multiple organ systems failure and requires high complexity decision making for assessment and support, frequent evaluation and titration of therapies, application of advanced monitoring technologies and extensive interpretation of multiple databases.  Independent Critical Care Time: 31 Minutes.   Rodman Pickle, M.D. [facility] Pulmonary/Critical Care Medicine 03/21/2021 8:57 AM   Please see Amion for pager number to reach on-call Pulmonary and Critical Care Team.

## 2021-03-21 NOTE — Progress Notes (Signed)
Nephrology Follow-Up Consult note   Assessment/Recommendations: Bruce Hunter is a/an 45 y.o. male with a past medical history significant for HIV/AIDS, CKD, asthma, obesity, DM2 admitted for acute hypoxic respiratory failure with sepsis.      Subjective:  remains off pressors, UOP much better, 1 L yest and 1.8 L today so far. BP's are stable.   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  CKD with baseline of 2.5-3.0 range.  Did have some urinary retention so Foley catheter placed. CRRT started on 7/02, held on 7/7 and 7/8 and resumed on 7/09. CRRT dc'd on 7/15 then when off pressor support and transferred to [address] [facility] yest for possible need for iHD. Off pressors/ CRRT pt is now making very good UOP.  Creat only up 0.1 from yest to today. Appears he may be recovering function. Will hold off on RRT for now, f/u labs in am.   BP/volume - peak wt was 11 kg up, now is 1kg under admit wt. No edema on exam. Will add NS at 40 cc/hr to TF's for volume support now that he is voiding.  Multifocal PNA - ID suspected PCP + secondary MSSA infection. CXR's most cleared w/ some residual RUL infiltrates HTN - started on po / NG BP lowering meds w/ norvasc and clonidine AHRF: sp trach, vent dependent still HIV: ID managing DM2: Insulin management per primary Anemia macrocytic. Likely multifactorial with infection contributing. Transfuse PRBC's prn. Started darbe 40 ug per wk.   Kelly Splinter, MD 03/21/2021, 4:13 PM       Medications:  Current Facility-Administered Medications  Medication Dose Route Frequency Provider Last Rate Last Admin   0.9 %  sodium chloride infusion (Manually program via Guardrails IV Fluids)   Intravenous Once Brand Males, MD       0.9 %  sodium chloride infusion  250 mL Intravenous Continuous Candee Furbish, MD 10 mL/hr at 03/21/21 1408 250 mL at 03/21/21 1408   0.9 %  sodium chloride infusion  250 mL Intravenous Continuous  Anders Simmonds, MD   Stopped at 03/19/21 2158   acetaminophen (TYLENOL) 160 MG/5ML solution 650 mg  650 mg Per Tube Q6H PRN Tanda Rockers, MD   650 mg at 03/20/21 0924   albuterol (PROVENTIL) (2.5 MG/3ML) 0.083% nebulizer solution 2.5 mg  2.5 mg Nebulization Q2H PRN Collier Bullock, MD       alteplase (CATHFLO ACTIVASE) injection 2 mg  2 mg Intracatheter Once Tanda Rockers, MD       alteplase (CATHFLO ACTIVASE) injection 2 mg  2 mg Intracatheter Once Roney Jaffe, MD       amLODipine (NORVASC) tablet 10 mg  10 mg Per Tube Daily Rigoberto Noel, MD   10 mg at 03/21/21 1006   chlorhexidine gluconate (MEDLINE KIT) (PERIDEX) 0.12 % solution 15 mL  15 mL Mouth Rinse BID Candee Furbish, MD   15 mL at 03/21/21 0819   Chlorhexidine Gluconate Cloth 2 % PADS 6 each  6 each Topical Daily Brand Males, MD   6 each at 03/21/21 0627   clindamycin (CLEOCIN) IVPB 900 mg  900 mg Intravenous Q8H Carlyle Basques, MD   Stopped at 03/21/21 1440   clonazePAM (KLONOPIN) tablet 0.5 mg  0.5 mg Per Tube BID Rigoberto Noel, MD   0.5 mg at 03/21/21 1006   cloNIDine (CATAPRES) tablet 0.1 mg  0.1 mg Per Tube BID Juanito Doom, MD   0.1  mg at 03/21/21 1006   Darbepoetin Alfa (ARANESP) injection 40 mcg  40 mcg Subcutaneous Q Sun-1800 Claudia Desanctis, MD   40 mcg at 03/14/21 1817   dexmedetomidine (PRECEDEX) 400 MCG/100ML (4 mcg/mL) infusion  0.4-1.2 mcg/kg/hr Intravenous Titrated Juanito Doom, MD   Stopped at 03/21/21 1445   docusate (COLACE) 50 MG/5ML liquid 100 mg  100 mg Per Tube BID PRN Tanda Rockers, MD   100 mg at 03/08/21 1358   dolutegravir (TIVICAY) tablet 50 mg  50 mg Per Tube Daily Tanda Rockers, MD   50 mg at 03/21/21 1226   feeding supplement (PIVOT 1.5 CAL) liquid 1,000 mL  1,000 mL Per Tube Q24H Rigoberto Noel, MD 65 mL/hr at 03/21/21 1400 Restarted at 03/21/21 1400   fentaNYL (SUBLIMAZE) injection 25-100 mcg  25-100 mcg Intravenous Q2H PRN Simonne Maffucci B, MD   100 mcg at 03/21/21  1414   gabapentin (NEURONTIN) 250 MG/5ML solution 100 mg  100 mg Per Tube Q12H Bowser, Laurel Dimmer, NP   100 mg at 03/21/21 1009   heparin injection 1,000-6,000 Units  1,000-6,000 Units CRRT PRN Roney Jaffe, MD   2,400 Units at 03/19/21 0943   heparin injection 5,000 Units  5,000 Units Subcutaneous Q8H Simonne Maffucci B, MD   5,000 Units at 03/21/21 1351   insulin aspart (novoLOG) injection 0-15 Units  0-15 Units Subcutaneous Q4H Ollis, Brandi L, NP   3 Units at 03/21/21 1544   insulin glargine (LANTUS) injection 10 Units  10 Units Subcutaneous Daily Rigoberto Noel, MD   10 Units at 03/21/21 1005   ipratropium-albuterol (DUONEB) 0.5-2.5 (3) MG/3ML nebulizer solution 3 mL  3 mL Nebulization TID Simonne Maffucci B, MD   3 mL at 03/21/21 1339   labetalol (NORMODYNE) injection 10-20 mg  10-20 mg Intravenous Q2H PRN Rigoberto Noel, MD   20 mg at 03/19/21 1658   lamiVUDine (EPIVIR) 10 MG/ML solution 300 mg  300 mg Per Tube Daily Tommy Medal, Lavell Islam, MD   300 mg at 03/21/21 1351   lidocaine (LIDODERM) 5 % 1 patch  1 patch Transdermal Q24H Simonne Maffucci B, MD   1 patch at 03/20/21 1743   linezolid (ZYVOX) tablet 600 mg  600 mg Per Tube Kateri Mc, MD   600 mg at 03/21/21 1005   lip balm (CARMEX) ointment   Topical PRN Juanito Doom, MD       MEDLINE mouth rinse  15 mL Mouth Rinse 10 times per day Candee Furbish, MD   15 mL at 03/21/21 1544   midazolam (VERSED) injection 1-2 mg  1-2 mg Intravenous Q2H PRN Cristal Generous, NP   2 mg at 03/19/21 1815   ondansetron (ZOFRAN) injection 4 mg  4 mg Intravenous Q6H PRN Collier Bullock, MD       oxyCODONE (ROXICODONE) 5 MG/5ML solution 5 mg  5 mg Per Tube Q12H Kara Mead V, MD   5 mg at 03/21/21 0621   pantoprazole sodium (PROTONIX) 40 mg/20 mL oral suspension 40 mg  40 mg Per Tube QHS Leodis Sias T, RPH   40 mg at 03/20/21 2046   polyethylene glycol (MIRALAX / GLYCOLAX) packet 17 g  17 g Per Tube Daily PRN Tanda Rockers, MD        predniSONE 5 MG/ML concentrated solution 20 mg  20 mg Per Tube Q breakfast Rigoberto Noel, MD   20 mg at 03/21/21 0909   primaquine tablet 30  mg  30 mg Per Tube Daily Simonne Maffucci B, MD   30 mg at 03/21/21 1006   sodium chloride flush (NS) 0.9 % injection 10-40 mL  10-40 mL Intracatheter Q12H Tanda Rockers, MD   10 mL at 03/21/21 1007   sodium chloride flush (NS) 0.9 % injection 10-40 mL  10-40 mL Intracatheter PRN Tanda Rockers, MD         Physical Exam: Vitals:   03/21/21 1400 03/21/21 1500  BP: 135/81 132/85  Pulse: 94 100  Resp: (!) 29 (!) 27  Temp:  98.7 F (37.1 C)  SpO2: 98% 100%   Total I/O In: 656.1 [I.V.:86.1; NG/GT:520; IV Piggyback:50] Out: 950 [Urine:950]  Intake/Output Summary (Last 24 hours) at 03/21/2021 1613 Last data filed at 03/21/2021 1500 Gross per 24 hour  Intake 3682.55 ml  Output 2400 ml  Net 1282.55 ml    General adult male on vent w/ trach   HEENT normocephalic atraumatic  Neck supple trachea midline, trach+ Lungs coarse mechanical breath sounds  Heart S1S2 no rub Abdomen soft nontender with limits of sedation distended Extremities minimal LE edema Neuro sedation running Access: RIJ nontunneled catheter in place   Test Results I personally reviewed new and old clinical labs and radiology tests Lab Results  Component Value Date   NA 136 03/21/2021   K 4.0 03/21/2021   CL 103 03/21/2021   CO2 21 (L) 03/21/2021   BUN 111 (H) 03/21/2021   CREATININE 2.90 (H) 03/21/2021   CALCIUM 8.8 (L) 03/21/2021   ALBUMIN 1.9 (L) 03/21/2021   PHOS 7.3 (H) 03/21/2021     [name], MD 03/21/2021 4:13 PM

## 2021-03-21 NOTE — Progress Notes (Signed)
Pt admitted to [facility] from [facility] for IHD. Pt is A&Ox4 with periods of restlessness and agitation. Currently on precedex gtt. Pt has 8 Shiley trach and is on 28% humidified 02 with 02 sats @ 100%. Pt has c/o severe pain/discomfort in feet/back, PRN analgesics given throughout shift. Pt educated on fall prevention strategies as well as call bell usage. Pt verbalized understanding and is agreeable with plan of care.  Pt requested his niece and Bruce Hunter be notified of his arrival to unit and updated on status. Niece and Reverend updated via phone on merged call. All questions answered and educated on current visitation policy.

## 2021-03-21 NOTE — Progress Notes (Signed)
Subjective: He would like to build to drink liquids and also eat food   Antibiotics:  Anti-infectives (From admission, onward)    Start     Dose/Rate Route Frequency Ordered Stop   03/18/21 1530  linezolid (ZYVOX) tablet 600 mg        600 mg Per Tube Every 12 hours 03/18/21 1431     03/18/21 1515  linezolid (ZYVOX) 100 MG/5ML suspension 600 mg  Status:  Discontinued        600 mg Per Tube Every 12 hours 03/18/21 1427 03/18/21 1431   03/17/21 1000  primaquine tablet 30 mg        30 mg Per Tube Daily 03/17/21 0856     03/16/21 1000  ceFAZolin (ANCEF) IVPB 1 g/50 mL premix  Status:  Discontinued        1 g 100 mL/hr over 30 Minutes Intravenous Every 12 hours 03/15/21 1251 03/15/21 1301   03/16/21 1000  ceFAZolin (ANCEF) 2 g in sodium chloride 0.9 % 100 mL IVPB  Status:  Discontinued        2 g 200 mL/hr over 30 Minutes Intravenous Every 12 hours 03/15/21 1302 03/18/21 1427   03/13/21 2200  fluconazole (DIFLUCAN) IVPB 400 mg        400 mg 100 mL/hr over 120 Minutes Intravenous Every 24 hours 03/13/21 1047 03/19/21 0031   03/13/21 1800  acyclovir (ZOVIRAX) 900 mg in dextrose 5 % 150 mL IVPB  Status:  Discontinued        900 mg 168 mL/hr over 60 Minutes Intravenous Every 24 hours 03/13/21 0958 03/13/21 1047   03/13/21 1800  acyclovir (ZOVIRAX) 900 mg in dextrose 5 % 150 mL IVPB  Status:  Discontinued        900 mg 168 mL/hr over 60 Minutes Intravenous Every 12 hours 03/13/21 1047 03/14/21 1700   03/13/21 1000  emtricitabine-tenofovir AF (DESCOVY) 200-25 MG per tablet 1 tablet        1 tablet Per Tube Daily 03/12/21 1350     03/12/21 2200  vancomycin (VANCOREADY) IVPB 750 mg/150 mL  Status:  Discontinued        750 mg 150 mL/hr over 60 Minutes Intravenous Every 12 hours 03/12/21 1249 03/12/21 1806   03/12/21 2200  clindamycin (CLEOCIN) IVPB 900 mg        900 mg 100 mL/hr over 30 Minutes Intravenous Every 8 hours 03/12/21 1729     03/12/21 1815  primaquine tablet 30 mg   Status:  Discontinued        30 mg Oral Daily 03/12/21 1729 03/17/21 0856   03/12/21 1400  vancomycin (VANCOREADY) IVPB 1000 mg/200 mL        1,000 mg 200 mL/hr over 60 Minutes Intravenous  Once 03/12/21 1249 03/12/21 1540   03/12/21 1400  acyclovir (ZOVIRAX) 900 mg in dextrose 5 % 150 mL IVPB  Status:  Discontinued        900 mg 168 mL/hr over 60 Minutes Intravenous Every 12 hours 03/12/21 1249 03/13/21 0958   03/12/21 1300  cefTRIAXone (ROCEPHIN) 2 g in sodium chloride 0.9 % 100 mL IVPB  Status:  Discontinued        2 g 200 mL/hr over 30 Minutes Intravenous Every 12 hours 03/12/21 1203 03/15/21 1251   03/10/21 2200  fluconazole (DIFLUCAN) IVPB 200 mg  Status:  Discontinued        200 mg 100 mL/hr over 60 Minutes Intravenous Every 24  hours 03/10/21 1333 03/13/21 1047   03/10/21 1800  ceFAZolin (ANCEF) IVPB 2g/100 mL premix  Status:  Discontinued        2 g 200 mL/hr over 30 Minutes Intravenous Every 8 hours 03/10/21 1325 03/12/21 1203   03/09/21 2200  ceFAZolin (ANCEF) IVPB 2g/100 mL premix  Status:  Discontinued        2 g 200 mL/hr over 30 Minutes Intravenous Every 12 hours 03/09/21 1445 03/10/21 1325   03/09/21 2000  ceFAZolin (ANCEF) IVPB 2g/100 mL premix  Status:  Discontinued        2 g 200 mL/hr over 30 Minutes Intravenous Every 8 hours 03/09/21 1440 03/09/21 1445   03/09/21 1645  dapsone tablet 100 mg  Status:  Discontinued        100 mg Per Tube Daily 03/09/21 1556 03/12/21 1729   03/07/21 2200  primaquine tablet 30 mg  Status:  Discontinued        30 mg Per Tube Every 24 hours 03/07/21 0915 03/09/21 1439   03/07/21 1000  dolutegravir (TIVICAY) tablet 50 mg        50 mg Per Tube Daily 03/06/21 1132     03/07/21 1000  lamiVUDine (EPIVIR) tablet 150 mg  Status:  Discontinued        150 mg Per Tube Daily 03/06/21 1324 03/12/21 1350   03/06/21 2200  fluconazole (DIFLUCAN) IVPB 400 mg  Status:  Discontinued        400 mg 100 mL/hr over 120 Minutes Intravenous Every 24 hours  03/06/21 1251 03/10/21 1333   03/06/21 1400  ceFEPIme (MAXIPIME) 2 g in sodium chloride 0.9 % 100 mL IVPB  Status:  Discontinued        2 g 200 mL/hr over 30 Minutes Intravenous Every 12 hours 03/06/21 1251 03/09/21 1439   03/05/21 2130  fluconazole (DIFLUCAN) IVPB 200 mg  Status:  Discontinued        200 mg 100 mL/hr over 60 Minutes Intravenous Every 24 hours 03/05/21 0810 03/06/21 1251   03/05/21 1000  dolutegravir (TIVICAY) tablet 50 mg  Status:  Discontinued        50 mg Oral Daily 03/04/21 2214 03/06/21 1132   03/05/21 1000  lamiVUDine (EPIVIR) tablet 300 mg  Status:  Discontinued        300 mg Oral Daily 03/04/21 2217 03/04/21 2217   03/05/21 1000  lamiVUDine (EPIVIR) tablet 150 mg  Status:  Discontinued        150 mg Oral Daily 03/04/21 2217 03/06/21 1324   03/04/21 2200  clindamycin (CLEOCIN) IVPB 900 mg  Status:  Discontinued        900 mg 100 mL/hr over 30 Minutes Intravenous Every 8 hours 03/04/21 2157 03/09/21 1439   03/04/21 2200  primaquine tablet 30 mg  Status:  Discontinued        30 mg Oral Every 24 hours 03/04/21 2157 03/07/21 0915   03/04/21 2145  vancomycin (VANCOREADY) IVPB 1500 mg/300 mL        1,500 mg 150 mL/hr over 120 Minutes Intravenous  Once 03/04/21 2144 03/05/21 0050   03/04/21 2145  ceFEPIme (MAXIPIME) 2 g in sodium chloride 0.9 % 100 mL IVPB  Status:  Discontinued        2 g 200 mL/hr over 30 Minutes Intravenous Every 24 hours 03/04/21 2144 03/06/21 1251   03/04/21 2143  vancomycin variable dose per unstable renal function (pharmacist dosing)  Status:  Discontinued  Does not apply See admin instructions 03/04/21 2144 03/06/21 0813   03/04/21 2130  fluconazole (DIFLUCAN) IVPB 400 mg  Status:  Discontinued        400 mg 100 mL/hr over 120 Minutes Intravenous Every 24 hours 03/04/21 2126 03/05/21 0810   03/04/21 1915  cefTRIAXone (ROCEPHIN) 1 g in sodium chloride 0.9 % 100 mL IVPB        1 g 200 mL/hr over 30 Minutes Intravenous  Once 03/04/21 1914  03/04/21 2045   03/04/21 1915  azithromycin (ZITHROMAX) 500 mg in sodium chloride 0.9 % 250 mL IVPB        500 mg 250 mL/hr over 60 Minutes Intravenous  Once 03/04/21 1914 03/04/21 2201       Medications: Scheduled Meds:  sodium chloride   Intravenous Once   alteplase  2 mg Intracatheter Once   alteplase  2 mg Intracatheter Once   amLODipine  10 mg Per Tube Daily   chlorhexidine gluconate (MEDLINE KIT)  15 mL Mouth Rinse BID   Chlorhexidine Gluconate Cloth  6 each Topical Daily   clonazePAM  0.5 mg Per Tube BID   cloNIDine  0.1 mg Per Tube BID   darbepoetin (ARANESP) injection - NON-DIALYSIS  40 mcg Subcutaneous Q Sun-1800   dolutegravir  50 mg Per Tube Daily   emtricitabine-tenofovir AF  1 tablet Per Tube Daily   feeding supplement (PIVOT 1.5 CAL)  1,000 mL Per Tube Q24H   gabapentin  100 mg Per Tube Q12H   heparin injection (subcutaneous)  5,000 Units Subcutaneous Q8H   insulin aspart  0-15 Units Subcutaneous Q4H   insulin glargine  10 Units Subcutaneous Daily   ipratropium-albuterol  3 mL Nebulization TID   lidocaine  1 patch Transdermal Q24H   linezolid  600 mg Per Tube Q12H   mouth rinse  15 mL Mouth Rinse 10 times per day   oxyCODONE  5 mg Per Tube Q12H   pantoprazole sodium  40 mg Per Tube QHS   predniSONE  20 mg Per Tube Q breakfast   primaquine  30 mg Per Tube Daily   sodium chloride flush  10-40 mL Intracatheter Q12H   Continuous Infusions:  sodium chloride 10 mL/hr at 03/10/21 0111   sodium chloride Stopped (03/19/21 2158)   clindamycin (CLEOCIN) IV Stopped (03/21/21 4132)   dexmedetomidine (PRECEDEX) IV infusion 0.4 mcg/kg/hr (03/21/21 1100)   PRN Meds:.acetaminophen, albuterol, docusate, fentaNYL (SUBLIMAZE) injection, heparin, labetalol, lip balm, midazolam, ondansetron (ZOFRAN) IV, polyethylene glycol, sodium chloride flush    Objective: Weight change:   Intake/Output Summary (Last 24 hours) at 03/21/2021 1135 Last data filed at 03/21/2021 1100 Gross  per 24 hour  Intake 3530.07 ml  Output 2500 ml  Net 1030.07 ml   Blood pressure 134/82, pulse 87, temperature 98.2 F (36.8 C), temperature source Oral, resp. rate (!) 33, height 5' 10.98" (1.803 m), weight 89.3 kg, SpO2 98 %. Temp:  [97.9 F (36.6 C)-99.6 F (37.6 C)] 98.2 F (36.8 C) (07/17 0700) Pulse Rate:  [72-100] 87 (07/17 1100) Resp:  [19-37] 33 (07/17 1100) BP: (117-160)/(60-106) 134/82 (07/17 1100) SpO2:  [94 %-100 %] 98 % (07/17 1100) FiO2 (%):  [28 %] 28 % (07/17 1100)  Physical Exam: Physical Exam Constitutional:      Appearance: He is obese.  HENT:     Head: Normocephalic and atraumatic.  Eyes:     Conjunctiva/sclera: Conjunctivae normal.  Neck:     Trachea: Tracheostomy present.  Cardiovascular:  Rate and Rhythm: Normal rate and regular rhythm.  Pulmonary:     Effort: Pulmonary effort is normal. No respiratory distress.     Breath sounds: No stridor. Rhonchi present. No wheezing.  Abdominal:     General: There is no distension.     Palpations: Abdomen is soft. There is no mass.     Tenderness: There is no abdominal tenderness.  Musculoskeletal:        General: Normal range of motion.     Cervical back: Normal range of motion and neck supple.  Skin:    General: Skin is warm and dry.     Findings: No erythema or rash.  Neurological:     General: No focal deficit present.     Mental Status: He is alert and oriented to person, place, and time.  Psychiatric:        Mood and Affect: Mood normal.        Behavior: Behavior normal.        Thought Content: Thought content normal.        Judgment: Judgment normal.     CBC:    BMET Recent Labs    03/20/21 1026 03/21/21 0451  NA 139 136  K 3.9 4.0  CL 104 103  CO2 23 21*  GLUCOSE 209* 235*  BUN 97* 111*  CREATININE 2.79* 2.90*  CALCIUM 9.0 8.8*     Liver Panel  Recent Labs    03/19/21 0428 03/21/21 0451  PROT 7.5  --   ALBUMIN 2.1* 1.9*  AST 41  --   ALT 13  --   ALKPHOS 90  --    BILITOT 0.6  --        Sedimentation Rate No results for input(s): ESRSEDRATE in the last 72 hours. C-Reactive Protein No results for input(s): CRP in the last 72 hours.  Micro Results: Recent Results (from the past 720 hour(s))  Culture, blood (routine x 2)     Status: None   Collection Time: 03/04/21  6:14 PM   Specimen: BLOOD LEFT FOREARM  Result Value Ref Range Status   Specimen Description   Final    BLOOD LEFT FOREARM Performed at [facility] [address] [place], [place] [address]    Special Requests   Final    BOTTLES DRAWN AEROBIC AND ANAEROBIC Blood Culture adequate volume Performed at [facility] [address] [place], [place] [address]    Culture   Final    NO GROWTH 5 DAYS Performed at [facility], [place] [address], [place], [place] [address]    Report Status 03/09/2021 FINAL  Final  Resp Panel by RT-PCR (Flu A&B, Covid) Nasopharyngeal Swab     Status: None   Collection Time: 03/04/21  7:52 PM   Specimen: Nasopharyngeal Swab; Nasopharyngeal(NP) swabs in vial transport medium  Result Value Ref Range Status   SARS Coronavirus 2 by RT PCR NEGATIVE NEGATIVE Final    Comment: (NOTE) SARS-CoV-2 target nucleic acids are NOT DETECTED.  The SARS-CoV-2 RNA is generally detectable in upper respiratory specimens during the acute phase of infection. The lowest concentration of SARS-CoV-2 viral copies this assay can detect is 138 copies/mL. A negative result does not preclude SARS-Cov-2 infection and should not be used as the sole basis for treatment or other patient management decisions. A negative result may occur with  improper specimen collection/handling, submission of specimen other than nasopharyngeal swab, presence of viral mutation(s) within the areas targeted by  this assay, and inadequate number of viral copies(<138 copies/mL). A negative result must be combined with clinical observations, patient history,  and epidemiological information. The expected result is Negative.  Fact Sheet for Patients:  EntrepreneurPulse.com.au  Fact Sheet for Healthcare Providers:  IncredibleEmployment.be  This test is no t yet approved or cleared by the [place] FDA and  has been authorized for detection and/or diagnosis of SARS-CoV-2 by FDA under an Emergency Use Authorization (EUA). This EUA will remain  in effect (meaning this test can be used) for the duration of the COVID-19 declaration under Section 564(b)(1) of the Act, 21 U.S.C.section 360bbb-3(b)(1), unless the authorization is terminated  or revoked sooner.       Influenza A by PCR NEGATIVE NEGATIVE Final   Influenza B by PCR NEGATIVE NEGATIVE Final    Comment: (NOTE) The Xpert Xpress SARS-CoV-2/FLU/RSV plus assay is intended as an aid in the diagnosis of influenza from Nasopharyngeal swab specimens and should not be used as a sole basis for treatment. Nasal washings and aspirates are unacceptable for Xpert Xpress SARS-CoV-2/FLU/RSV testing.  Fact Sheet for Patients: EntrepreneurPulse.com.au  Fact Sheet for Healthcare Providers: IncredibleEmployment.be  This test is not yet approved or cleared by the [place] FDA and has been authorized for detection and/or diagnosis of SARS-CoV-2 by FDA under an Emergency Use Authorization (EUA). This EUA will remain in effect (meaning this test can be used) for the duration of the COVID-19 declaration under Section 564(b)(1) of the Act, 21 U.S.C. section 360bbb-3(b)(1), unless the authorization is terminated or revoked.  Performed at [facility], [place] [address], [place], [place] [address]   MRSA PCR Screening     Status: None   Collection Time: 03/05/21 10:41 AM  Result Value Ref Range Status   MRSA by PCR NEGATIVE NEGATIVE Final    Comment:        The GeneXpert MRSA Assay (FDA approved for  NASAL specimens only), is one component of a comprehensive MRSA colonization surveillance program. It is not intended to diagnose MRSA infection nor to guide or monitor treatment for MRSA infections. Performed at [facility], [place] [address] [place], [place] [address]   Respiratory (~20 pathogens) panel by PCR     Status: None   Collection Time: 03/05/21 10:42 AM   Specimen: Nasopharyngeal Swab; Respiratory  Result Value Ref Range Status   Adenovirus NOT DETECTED NOT DETECTED Final   Coronavirus 229E NOT DETECTED NOT DETECTED Final    Comment: (NOTE) The Coronavirus on the Respiratory Panel, DOES NOT test for the novel  Coronavirus (2019 nCoV)    Coronavirus HKU1 NOT DETECTED NOT DETECTED Final   Coronavirus NL63 NOT DETECTED NOT DETECTED Final   Coronavirus OC43 NOT DETECTED NOT DETECTED Final   Metapneumovirus NOT DETECTED NOT DETECTED Final   Rhinovirus / Enterovirus NOT DETECTED NOT DETECTED Final   Influenza A NOT DETECTED NOT DETECTED Final   Influenza B NOT DETECTED NOT DETECTED Final   Parainfluenza Virus 1 NOT DETECTED NOT DETECTED Final   Parainfluenza Virus 2 NOT DETECTED NOT DETECTED Final   Parainfluenza Virus 3 NOT DETECTED NOT DETECTED Final   Parainfluenza Virus 4 NOT DETECTED NOT DETECTED Final   Respiratory Syncytial Virus NOT DETECTED NOT DETECTED Final   Bordetella pertussis NOT DETECTED NOT DETECTED Final   Bordetella Parapertussis NOT DETECTED NOT DETECTED Final   Chlamydophila pneumoniae NOT DETECTED NOT DETECTED Final   Mycoplasma pneumoniae NOT DETECTED NOT DETECTED Final    Comment: Performed  at [facility], [place] [address], [place], [place] [address]  Gastrointestinal Panel by PCR , Stool     Status: None   Collection Time: 03/05/21  6:28 PM   Specimen: Stool  Result Value Ref Range Status   Campylobacter species NOT DETECTED NOT DETECTED Final   Plesimonas shigelloides NOT DETECTED NOT DETECTED Final   Salmonella species  NOT DETECTED NOT DETECTED Final   Yersinia enterocolitica NOT DETECTED NOT DETECTED Final   Vibrio species NOT DETECTED NOT DETECTED Final   Vibrio cholerae NOT DETECTED NOT DETECTED Final   Enteroaggregative E coli (EAEC) NOT DETECTED NOT DETECTED Final   Enteropathogenic E coli (EPEC) NOT DETECTED NOT DETECTED Final   Enterotoxigenic E coli (ETEC) NOT DETECTED NOT DETECTED Final   Shiga like toxin producing E coli (STEC) NOT DETECTED NOT DETECTED Final   Shigella/Enteroinvasive E coli (EIEC) NOT DETECTED NOT DETECTED Final   Cryptosporidium NOT DETECTED NOT DETECTED Final   Cyclospora cayetanensis NOT DETECTED NOT DETECTED Final   Entamoeba histolytica NOT DETECTED NOT DETECTED Final   Giardia lamblia NOT DETECTED NOT DETECTED Final   Adenovirus F40/41 NOT DETECTED NOT DETECTED Final   Astrovirus NOT DETECTED NOT DETECTED Final   Norovirus GI/GII NOT DETECTED NOT DETECTED Final   Rotavirus A NOT DETECTED NOT DETECTED Final   Sapovirus (I, II, IV, and V) NOT DETECTED NOT DETECTED Final    Comment: Performed at [facility], [place] [place], [place] [address]  Pneumocystis smear by DFA     Status: None   Collection Time: 03/06/21  1:09 PM   Specimen: Sputum; Respiratory  Result Value Ref Range Status   Specimen Source-PJSRC SPUTUM  Final   Pneumocystis jiroveci Ag NEGATIVE  Final    Comment: Performed at [facility] Performed at [facility] Performed at [facility], [place] [address], [place], [place] [address]   Culture, Respiratory w Gram Stain     Status: None   Collection Time: 03/06/21  1:10 PM   Specimen: Tracheal Aspirate; Respiratory  Result Value Ref Range Status   Specimen Description   Final    TRACHEAL ASPIRATE Performed at [facility] [address] [place], [place] [address]    Special Requests   Final    Immunocompromised Performed at [facility], [place]  [address] [place], [place] [address]    Gram Stain   Final    FEW WBC PRESENT,BOTH PMN AND MONONUCLEAR RARE SQUAMOUS EPITHELIAL CELLS PRESENT RARE GRAM POSITIVE RODS RARE GRAM POSITIVE COCCI IN PAIRS Performed at [facility], [place] [address], [place], [place] [address]    Culture RARE STAPHYLOCOCCUS AUREUS  Final   Report Status 03/09/2021 FINAL  Final   Organism ID, Bacteria STAPHYLOCOCCUS AUREUS  Final      Susceptibility   Staphylococcus aureus - MIC*    CIPROFLOXACIN <=0.5 SENSITIVE Sensitive     ERYTHROMYCIN <=0.25 SENSITIVE Sensitive     GENTAMICIN <=0.5 SENSITIVE Sensitive     OXACILLIN <=0.25 SENSITIVE Sensitive     TETRACYCLINE >=16 RESISTANT Resistant     VANCOMYCIN <=0.5 SENSITIVE Sensitive     TRIMETH/SULFA <=10 SENSITIVE Sensitive     CLINDAMYCIN <=0.25 SENSITIVE Sensitive     RIFAMPIN <=0.5 SENSITIVE Sensitive     Inducible Clindamycin NEGATIVE Sensitive     * RARE STAPHYLOCOCCUS AUREUS  Blastomyces Antigen     Status: None   Collection Time:  03/12/21  5:55 PM   Specimen: Blood  Result Value Ref Range Status   Blastomyces Antigen None Detected None Detected ng/mL Final    Comment: (NOTE) Results reported as ng/mL in 0.2 - 14.7 ng/mL range Results above the limit of detection but below 0.2 ng/mL are reported as 'Positive, Below the Limit of Quantification' Results above 14.7 ng/mL are reported as 'Positive, Above the Limit of Quantification'    Specimen Type SERUM  Final    Comment: (NOTE) Performed At: [facility] [place], [place] [id number] [place] Donath MD [id number]   Virus culture     Status: None   Collection Time: 03/12/21  7:08 PM   Specimen: CSF  Result Value Ref Range Status   Viral Culture Comment  Final    Comment: (NOTE) Preliminary Report: No virus isolated at 4 days.  Next report to follow after 7 days. Performed At: [facility] [place], [place] [id number] [place] Farmer  MD [id number]    Source of Sample BLOOD  Final    Comment: Performed at [facility], [place] [address] [place], [place] [address]  Virus culture     Status: None   Collection Time: 03/13/21  4:42 PM   Specimen: PATH Cytology CSF; Cerebrospinal Fluid  Result Value Ref Range Status   Viral Culture Comment  Final    Comment: (NOTE) Preliminary Report: No virus isolated at 4 days.  Next report to follow after 7 days. Performed At: [facility] [place], [place] [id number] [place] Farmer MD [id number]    Source of Sample CSF  Final    Comment: Performed at [facility], [place] [address] [place], [place] [address]  CSF culture w Gram Stain     Status: None   Collection Time: 03/13/21  4:42 PM   Specimen: PATH Cytology CSF; Cerebrospinal Fluid  Result Value Ref Range Status   Specimen Description   Final    CSF Performed at [facility] [address], [place], [place] [address]    Special Requests   Final    NONE Performed at [facility], [place] [address], [place], [place] [address]    Gram Stain   Final    NO ORGANISMS SEEN Gram Stain Report Called to,Read Back By and Verified With: ETHAN RN AT 1031 ON 03/13/21 BY S.VANHOORNE Performed at [facility], [place] [address], [place], [place] [address]    Culture   Final    NO GROWTH 3 DAYS Performed at [facility], [place] [address] [place], [place] [address]    Report Status 03/17/2021 FINAL  Final  Fungus Culture With Stain     Status: Abnormal   Collection Time: 03/13/21  4:42 PM   Specimen: PATH Cytology CSF; Cerebrospinal Fluid  Result Value Ref Range Status   Fungus Stain QNSTST (A)  Final    Comment: (NOTE) Test not performed. Insufficient specimen to perform or complete analysis.      Darrick Meigs S. was notified 03/17/2021.    Fungus (Mycology) Culture QNSTST (A)  Final    Comment: (NOTE) Test not  performed. Insufficient specimen to perform or complete analysis.      Darrick Meigs S. was notified 03/17/2021. Performed At: [facility] [place], [place] [id number] [place] Farmer MD [id number]    Fungal Source CSF  Final    Comment: Performed at [facility]  [address], [place], [place] [address]  Culture, fungus without smear     Status: None (Preliminary result)   Collection Time: 03/13/21  4:42 PM   Specimen: PATH Cytology CSF; Cerebrospinal Fluid  Result Value Ref Range Status   Specimen Description   Final    CSF Performed at [facility] [address], [place], [place] [address]    Special Requests   Final    NONE Performed at [facility], [place] [address], [place], [place] [address]    Culture   Final    NO FUNGUS ISOLATED AFTER 7 DAYS Performed at [facility], [place] [address], [place], [place] [address]    Report Status PENDING  Incomplete  Culture, Respiratory w Gram Stain     Status: None   Collection Time: 03/14/21  3:15 AM   Specimen: Tracheal Aspirate; Respiratory  Result Value Ref Range Status   Specimen Description   Final    TRACHEAL ASPIRATE Performed at [facility] [address], [place], [place] [address]    Special Requests   Final    NONE Performed at [facility], [place] [address] [place], [place] [address]    Gram Stain   Final    MODERATE WBC PRESENT,BOTH PMN AND MONONUCLEAR NO ORGANISMS SEEN    Culture   Final    NO GROWTH 2 DAYS Performed at [facility], [place] [address], [place], [place] [address]    Report Status 03/16/2021 FINAL  Final  Culture, Respiratory w Gram Stain     Status: None   Collection Time: 03/16/21  9:40 AM   Specimen: Bronchoalveolar Lavage; Respiratory  Result Value Ref Range Status   Specimen Description   Final    BRONCHIAL ALVEOLAR LAVAGE Performed at [facility] [address], [place], [place] [address]    Special Requests   Final    NONE Performed at [facility], [place] [address], [place], [place] [address]    Gram Stain   Final    FEW WBC PRESENT,BOTH PMN AND MONONUCLEAR NO ORGANISMS SEEN    Culture   Final    RARE Normal respiratory flora-no Staph aureus or Pseudomonas seen Performed at [facility], [address], [place], [place] [address]    Report Status 03/18/2021 FINAL  Final  Fungus Culture With Stain     Status: None (Preliminary result)   Collection Time: 03/16/21  9:40 AM   Specimen: Bronchoalveolar Lavage  Result Value Ref Range Status   Fungus Stain Final report  Final    Comment: (NOTE) Performed At: [facility] [place], [place] [id number] [place] Farmer MD [id number]    Fungus (Mycology) Culture PENDING  Incomplete   Fungal Source BRONCHIAL ALVEOLAR LAVAGE  Final    Comment: Performed at [facility], [place] [address], [place], [place] [address]  Pneumocystis smear by DFA     Status: None   Collection Time: 03/16/21  9:40 AM   Specimen: Bronchoalveolar Lavage; Respiratory  Result Value Ref Range Status   Specimen Source-PJSRC BRONCHIAL ALVEOLAR LAVAGE  Final   Pneumocystis jiroveci Ag NEGATIVE  Final    Comment: Performed at [facility] [address], [place], [place] [address]  Fungus Culture Result     Status: None   Collection Time: 03/16/21  9:40 AM  Result Value Ref Range Status   Result 1 Comment  Final  Comment: (NOTE) KOH/Calcofluor preparation:  no fungus observed. Performed At: [facility], [place], [place] [id number] Rush Farmer MD [id number]   Fungus Culture With Stain     Status: None (Preliminary result)   Collection Time: 03/16/21  9:41 AM   Specimen: Bronchoalveolar Lavage  Result Value Ref Range Status   Fungus Stain Final report  Final    Comment:  (NOTE) Performed At: [facility] [address] [place], [place] [id number] Rush Farmer MD IZ:[id number]    Fungus (Mycology) Culture PENDING  Incomplete   Fungal Source BRONCHIAL ALVEOLAR LAVAGE  Final    Comment: Performed at [facility], [place] [address] [place], [place] [address]  Culture, Respiratory w Gram Stain     Status: None   Collection Time: 03/16/21  9:41 AM   Specimen: Bronchoalveolar Lavage; Respiratory  Result Value Ref Range Status   Specimen Description   Final    BRONCHIAL ALVEOLAR LAVAGE Performed at [facility] [address] [place], [place] [address]    Special Requests   Final    NONE Performed at [facility], [place] [address] [place], [place] [address]    Gram Stain   Final    NO WBC SEEN NO ORGANISMS SEEN Performed at [facility], [place] [address], [place], [place] [address]    Culture RARE STAPHYLOCOCCUS EPIDERMIDIS  Final   Report Status 03/18/2021 FINAL  Final   Organism ID, Bacteria STAPHYLOCOCCUS EPIDERMIDIS  Final      Susceptibility   Staphylococcus epidermidis - MIC*    CIPROFLOXACIN 4 RESISTANT Resistant     ERYTHROMYCIN >=8 RESISTANT Resistant     GENTAMICIN <=0.5 SENSITIVE Sensitive     OXACILLIN >=4 RESISTANT Resistant     TETRACYCLINE >=16 RESISTANT Resistant     VANCOMYCIN 1 SENSITIVE Sensitive     TRIMETH/SULFA 40 SENSITIVE Sensitive     CLINDAMYCIN >=8 RESISTANT Resistant     RIFAMPIN 2 INTERMEDIATE Intermediate     Inducible Clindamycin NEGATIVE Sensitive     * RARE STAPHYLOCOCCUS EPIDERMIDIS  Fungus Culture Result     Status: None   Collection Time: 03/16/21  9:41 AM  Result Value Ref Range Status   Result 1 Comment  Final    Comment: (NOTE) KOH/Calcofluor preparation:  no fungus observed. Performed At: [facility] [place], [place] [id number] Rush Farmer MD JM:[id number]     Studies/Results: DG Chest Port 1  View  Result Date: 03/21/2021 CLINICAL DATA:  45 year old male with history of acute hypoxic respiratory failure. EXAM: PORTABLE CHEST 1 VIEW COMPARISON:  Chest x-ray 03/19/2021. FINDINGS: There is a right-sided internal jugular central venous catheter with tip terminating in the distal superior vena cava. A feeding tube is seen extending into the abdomen, however, the tip of the feeding tube extends below the lower margin of the image. Lung volumes are low. Patchy ill-defined opacities and widespread areas of interstitial prominence are again noted throughout the lungs bilaterally with relative sparing in the right lung base. Overall, aeration appears slightly worsened than examination from 2 days ago. No pleural effusions. No pneumothorax. Pulmonary vasculature is largely obscured. Mild cardiomegaly. Upper mediastinal contours are within normal limits. IMPRESSION: 1. Support apparatus, as above. 2. Slightly worsened aeration in the chest compared to 2 days ago. Overall, findings are favored to reflect ARDS. Electronically Signed   By: Vinnie Langton M.D.   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 male with HIV and AIDS mitis it initially seen in the hospital when he was diagnosed and placed on Dovato and PCP prophylaxis. He was admitted now with respiratory failure and concern for ARDS.  HIs HIV had responded DRAMATICALLY to Dovato with VL coming down from 2.4 million to a few hundred  copies  Been treated for pneumocystis empirically as well as for MSSA pneumonia.  Has had acute kidney injury and ATN from sepsis and was on CVVVHD and transferred to [facility] ICU for IHD  He was changed to [place] to and [place] but I do not know why. HIs Dovato was working just fine and he has no evidence of VF or R or HBV  I doubt TAF has anything to do with his renal faiure but do not see need for it  #1 Respiratory failure:  PJP ag and PCR both negative which is puzzling  I wonder if this may have been more IRIS possibly. Of course we wonder about KS as well though no mention of anything resemblng KS from on bronchoscopy.  Did also have MSSA more releasing MRSE in bronchoalveolar lavage and has been completing Zyvox  #2 HIV/AIDS: Bonded dramatically to his treatment I am going to put him back on what he initially was placed on namely Dovato or in this case the components of Dovato   #3 Renal failure: Likely due to ATN in the setting of sepsis.  I spent more than 35 minutes with the patient including face to face counseling of the patient personally reviewing radiographs, along with pertinent laboratory microbiological, virological data review of medical records before and during the visit and in coordination of his care.   For now we will continue him on PCP treatment with antibiotics and steroids       LOS: 17 days   Alcide Evener 03/21/2021, 11:35 AM

## 2021-03-22 ENCOUNTER — Inpatient Hospital Stay (HOSPITAL_COMMUNITY): Payer: Medicaid Other

## 2021-03-22 DIAGNOSIS — G9341 Metabolic encephalopathy: Secondary | ICD-10-CM | POA: Diagnosis not present

## 2021-03-22 DIAGNOSIS — J9601 Acute respiratory failure with hypoxia: Secondary | ICD-10-CM | POA: Diagnosis not present

## 2021-03-22 DIAGNOSIS — N179 Acute kidney failure, unspecified: Secondary | ICD-10-CM | POA: Diagnosis not present

## 2021-03-22 DIAGNOSIS — B20 Human immunodeficiency virus [HIV] disease: Secondary | ICD-10-CM | POA: Diagnosis not present

## 2021-03-22 LAB — CBC
HCT: 21.8 % — ABNORMAL LOW (ref 39.0–52.0)
HCT: 27.4 % — ABNORMAL LOW (ref 39.0–52.0)
Hemoglobin: 6.5 g/dL — CL (ref 13.0–17.0)
Hemoglobin: 8.3 g/dL — ABNORMAL LOW (ref 13.0–17.0)
MCH: 29.1 pg (ref 26.0–34.0)
MCH: 28.9 pg (ref 26.0–34.0)
MCHC: 29.8 g/dL — ABNORMAL LOW (ref 30.0–36.0)
MCHC: 30.3 g/dL (ref 30.0–36.0)
MCV: 97.8 fL (ref 80.0–100.0)
MCV: 95.5 fL (ref 80.0–100.0)
Platelets: 200 10*3/uL (ref 150–400)
Platelets: 234 10*3/uL (ref 150–400)
RBC: 2.23 MIL/uL — ABNORMAL LOW (ref 4.22–5.81)
RBC: 2.87 MIL/uL — ABNORMAL LOW (ref 4.22–5.81)
RDW: 22.3 % — ABNORMAL HIGH (ref 11.5–15.5)
RDW: 21.3 % — ABNORMAL HIGH (ref 11.5–15.5)
WBC: 12 10*3/uL — ABNORMAL HIGH (ref 4.0–10.5)
WBC: 12.3 10*3/uL — ABNORMAL HIGH (ref 4.0–10.5)
nRBC: 0 % (ref 0.0–0.2)
nRBC: 0 % (ref 0.0–0.2)

## 2021-03-22 LAB — PREPARE RBC (CROSSMATCH)

## 2021-03-22 LAB — COMPREHENSIVE METABOLIC PANEL
ALT: 13 U/L (ref 0–44)
AST: 20 U/L (ref 15–41)
Albumin: 1.8 g/dL — ABNORMAL LOW (ref 3.5–5.0)
Alkaline Phosphatase: 63 U/L (ref 38–126)
Anion gap: 11 (ref 5–15)
BUN: 113 mg/dL — ABNORMAL HIGH (ref 6–20)
CO2: 23 mmol/L (ref 22–32)
Calcium: 8.9 mg/dL (ref 8.9–10.3)
Chloride: 111 mmol/L (ref 98–111)
Creatinine, Ser: 2.56 mg/dL — ABNORMAL HIGH (ref 0.61–1.24)
GFR, Estimated: 31 mL/min — ABNORMAL LOW (ref >60)
Glucose, Bld: 185 mg/dL — ABNORMAL HIGH (ref 70–99)
Potassium: 4 mmol/L (ref 3.5–5.1)
Sodium: 145 mmol/L (ref 135–145)
Total Bilirubin: 0.6 mg/dL (ref 0.3–1.2)
Total Protein: 6.8 g/dL (ref 6.5–8.1)

## 2021-03-22 LAB — GLUCOSE, CAPILLARY
Glucose-Capillary: 112 mg/dL — ABNORMAL HIGH (ref 70–99)
Glucose-Capillary: 117 mg/dL — ABNORMAL HIGH (ref 70–99)
Glucose-Capillary: 187 mg/dL — ABNORMAL HIGH (ref 70–99)
Glucose-Capillary: 150 mg/dL — ABNORMAL HIGH (ref 70–99)
Glucose-Capillary: 143 mg/dL — ABNORMAL HIGH (ref 70–99)
Glucose-Capillary: 160 mg/dL — ABNORMAL HIGH (ref 70–99)
Glucose-Capillary: 200 mg/dL — ABNORMAL HIGH (ref 70–99)

## 2021-03-22 LAB — APTT: aPTT: 36 seconds (ref 24–36)

## 2021-03-22 LAB — PROTIME-INR
INR: 1.1 (ref 0.8–1.2)
Prothrombin Time: 14.6 seconds (ref 11.4–15.2)

## 2021-03-22 MED ORDER — DARBEPOETIN ALFA 200 MCG/0.4ML IJ SOSY
200.0000 ug | PREFILLED_SYRINGE | INTRAMUSCULAR | Status: DC
Start: 2021-03-28 — End: 2021-03-28
  Filled 2021-03-22: qty 0.4

## 2021-03-22 MED ORDER — PANTOPRAZOLE SODIUM 40 MG PO TBEC
40.0000 mg | DELAYED_RELEASE_TABLET | Freq: Every day | ORAL | Status: DC
  Administered 2021-03-22: 40 mg via ORAL
  Filled 2021-03-22 (×2): qty 1

## 2021-03-22 MED ORDER — SODIUM CHLORIDE 0.9% IV SOLUTION: Freq: Once | INTRAVENOUS | Status: AC

## 2021-03-22 MED ORDER — PREDNISONE 20 MG PO TABS
20.0000 mg | ORAL_TABLET | Freq: Every day | ORAL | Status: DC
  Administered 2021-03-22 – 2021-03-30 (×9): 20 mg via ORAL
  Filled 2021-03-22 (×9): qty 1

## 2021-03-22 MED ORDER — INSULIN GLARGINE 100 UNIT/ML ~~LOC~~ SOLN
5.0000 [IU] | Freq: Every day | SUBCUTANEOUS | Status: DC
  Administered 2021-03-22 – 2021-03-30 (×9): 5 [IU] via SUBCUTANEOUS
  Filled 2021-03-22 (×9): qty 0.05

## 2021-03-22 MED ORDER — LAMIVUDINE 150 MG PO TABS
300.0000 mg | ORAL_TABLET | Freq: Every day | ORAL | Status: DC
  Administered 2021-03-22 – 2021-03-30 (×9): 300 mg via ORAL
  Filled 2021-03-22 (×9): qty 2

## 2021-03-22 MED ORDER — DEXTROSE 10 % IV SOLN: INTRAVENOUS | Status: DC

## 2021-03-22 MED ORDER — DEXTROSE 50 % IV SOLN
INTRAVENOUS | Status: AC
  Filled 2021-03-22: qty 50

## 2021-03-22 MED ORDER — OXYCODONE HCL 5 MG PO TABS
5.0000 mg | ORAL_TABLET | Freq: Three times a day (TID) | ORAL | Status: DC
  Administered 2021-03-22 – 2021-03-25 (×10): 5 mg via ORAL
  Filled 2021-03-22 (×11): qty 1

## 2021-03-22 MED ORDER — CLONIDINE HCL 0.2 MG PO TABS
0.2000 mg | ORAL_TABLET | Freq: Three times a day (TID) | ORAL | Status: DC
  Administered 2021-03-22 – 2021-03-23 (×3): 0.2 mg
  Filled 2021-03-22 (×3): qty 1

## 2021-03-22 MED ORDER — GABAPENTIN 100 MG PO CAPS
100.0000 mg | ORAL_CAPSULE | Freq: Two times a day (BID) | ORAL | Status: DC
  Administered 2021-03-22 – 2021-03-29 (×15): 100 mg via ORAL
  Filled 2021-03-22 (×15): qty 1

## 2021-03-22 MED ORDER — OXYCODONE HCL 5 MG/5ML PO SOLN
5.0000 mg | Freq: Three times a day (TID) | ORAL | Status: DC
Start: 2021-03-22 — End: 2021-03-22
  Filled 2021-03-22: qty 5

## 2021-03-22 NOTE — Progress Notes (Addendum)
eLink Physician-Brief Progress Note Patient Name: EYASU JILES DOB: 12-27-1975 MRN: [id number]   Date of Service  03/22/2021  HPI/Events of Note  Patient pulled out feeding tube. Patient on Lantus + Q 4 hour moderate Novolog SSI  eICU Interventions  Plan: D10W to run IV at 30 mL/hour. Decrease Lantus dose to 5 units [place] Q day.     Intervention Category Major Interventions: Hyperglycemia - active titration of insulin therapy  Melady Chow [place] 03/22/2021, 2:15 AM

## 2021-03-22 NOTE — Evaluation (Signed)
Clinical/Bedside Swallow Evaluation Patient Details  Name: Bruce Hunter MRN: [id number] Date of Birth: 12/22/1975  Today's Date: 03/22/2021 Time: SLP Start Time (ACUTE ONLY): 1125 SLP Stop Time (ACUTE ONLY): 1135 SLP Time Calculation (min) (ACUTE ONLY): 10 min  Past Medical History:  Past Medical History:  Diagnosis Date   Asthma    Diabetes mellitus without complication ([place])    Gout    Hyperlipidemia    Hypertension    Obesity    Past Surgical History:  Past Surgical History:  Procedure Laterality Date   BIOPSY  02/15/2021   Procedure: BIOPSY;  Surgeon: Ronnette Juniper, MD;  Location: [facility];  Service: Gastroenterology;;   COLONOSCOPY WITH PROPOFOL N/A 02/15/2021   Procedure: COLONOSCOPY WITH PROPOFOL;  Surgeon: Ronnette Juniper, MD;  Location: [facility];  Service: Gastroenterology;  Laterality: N/A;   ESOPHAGOGASTRODUODENOSCOPY (EGD) WITH PROPOFOL N/A 02/15/2021   Procedure: ESOPHAGOGASTRODUODENOSCOPY (EGD) WITH PROPOFOL;  Surgeon: Ronnette Juniper, MD;  Location: [facility];  Service: Gastroenterology;  Laterality: N/A;   GANGLION CYST EXCISION Right 02/26/2020   Procedure: EXCISION TOPHUS RIGHT FOOT;  Surgeon: Evelina Bucy, DPM;  Location: [facility];  Service: Podiatry;  Laterality: Right;   NO PAST SURGERIES     HPI:  45 y.o. male with a past medical history significant for HIV/AIDS, CKD, asthma, obesity, DM2 admitted for acute hypoxic respiratory failure with sepsis, requiring ventilator support on 7/1 and tracheostomy 7/13, CRRT.   Assessment / Plan / Recommendation Clinical Impression  Pt seen for clinical swallow evaluation. PMSV in place by SLP, pt tolerating very well. Performed oral care, pt with some posterior lingual coating noted. Repositioned pt fully upright. Pt able to masticate single ice chip with palpable swallow, delayed throat clear noted x1. Assessed with small sip of water with palpable swallow also noted. Will proceed with instrumental  asessment: MBSS this date planned 1230 pm this date to guide readiness for PO diet as aspiration risk remains increased in setting of tracheostomy. Pt agreeable. Continue NPO until completion of MBSS.   SLP Visit Diagnosis: Aphonia (R49.1)    Aspiration Risk  Mild aspiration risk;Moderate aspiration risk    Diet Recommendation   NPO, MBSS 12:30 this date  Medication Administration: Via alternative means    Other  Recommendations Oral Care Recommendations: Oral care QID   Follow up Recommendations Other (comment) (TBD)      Frequency and Duration min 2x/week  2 weeks       Prognosis Prognosis for Safe Diet Advancement: Good Barriers to Reach Goals: Time post onset;Behavior      Swallow Study   General Date of Onset: 03/04/21 HPI: 45 y.o. male with a past medical history significant for HIV/AIDS, CKD, asthma, obesity, DM2 admitted for acute hypoxic respiratory failure with sepsis, requiring ventilator support on 7/1 and tracheostomy 7/13, CRRT. Type of Study: Bedside Swallow Evaluation Previous Swallow Assessment: none on file Diet Prior to this Study: NPO Temperature Spikes Noted: No Respiratory Status: Trach Collar History of Recent Intubation: No Behavior/Cognition: Alert;Cooperative;Requires cueing Oral Cavity Assessment: Dry Oral Care Completed by SLP: Yes Oral Cavity - Dentition: Adequate natural dentition Vision: Functional for self-feeding Self-Feeding Abilities: Needs assist Patient Positioning: Upright in bed Baseline Vocal Quality: Low vocal intensity Volitional Cough: Strong Volitional Swallow: Able to elicit    Oral/Motor/Sensory Function Overall Oral Motor/Sensory Function: Generalized oral weakness   Ice Chips Ice chips: Impaired Presentation: Spoon Oral Phase Functional Implications: Prolonged oral transit Pharyngeal Phase Impairments: Suspected delayed Swallow;Multiple swallows;Throat Clearing -  Delayed   Thin Liquid Thin Liquid:  Impaired Presentation: Cup Oral Phase Impairments: Reduced lingual movement/coordination Pharyngeal  Phase Impairments: Suspected delayed Swallow;Multiple swallows;Throat Clearing - Delayed    Nectar Thick Nectar Thick Liquid: Not tested   Honey Thick Honey Thick Liquid: Not tested   Puree Puree: Not tested   Solid     Solid: Not tested      Bruce Rasmussen MA, CCC-SLP Acute Rehabilitation Services   03/22/2021,12:03 PM

## 2021-03-22 NOTE — Progress Notes (Signed)
eLink Physician-Brief Progress Note Patient Name: BERTIE RAO DOB: October 30, 1975 MRN: [id number]   Date of Service  03/22/2021  HPI/Events of Note  Anemia - Hgb = 6.5. Nursing reports small to moderate amount of blood with tracheal suctioning. Patient is on Heparin [place] for DVT prophylaxis.   eICU Interventions  Plan: Transfuse 1 unit PRBC now. PT/INR and PTT now.      Intervention Category Major Interventions: Other:  Shania Bjelland Cornelia Copa 03/22/2021, 6:00 AM

## 2021-03-22 NOTE — Progress Notes (Addendum)
Patient has removed his NG tube due to confusion. E link has been notified.

## 2021-03-22 NOTE — Progress Notes (Signed)
Nutrition Follow-up  DOCUMENTATION CODES:   Not applicable  INTERVENTION:   Magic cup TID with meals, each supplement provides 290 kcal and 9 grams of protein. RD to monitor for adequacy of oral intake. If oral intake is inadequate, can place Cortrak on Wednesday.   NUTRITION DIAGNOSIS:   Increased nutrient needs related to chronic illness (HIV) as evidenced by estimated needs.  Ongoing   GOAL:   Patient will meet greater than or equal to 90% of their needs  Progressing  MONITOR:   PO intake, Supplement acceptance, Labs, Skin  REASON FOR ASSESSMENT:   Ventilator, Consult Enteral/tube feeding initiation and management  ASSESSMENT:   45 year-old male with medical history of recently diagnosed HIV/AIDS and non-compliant with HAART, DM, asthma, HLD, HTN, gout, and obesity. He presented to the ED d/t shortness of breath, peripheral edema, and productive (tan sputum) cough.  Discussed patient in ICU rounds and with RN today.  Patient remains on trach collar.   CRRT stopped 7/15. No current indication for dialysis support per Nephrology.   NG tube accidentally removed by patient last night; TF off.  S/P MBS with SLP today. Diet advanced to dysphagia 3 with honey thick liquids. Plans to replace Cortrak on hold since patient is now on a diet. Will monitor for adequacy of oral intake. Next Cortrak service available Wednesday.   Labs reviewed.  CBG: [id number]  Medications reviewed and include Aranesp weekly, Novolog, Lantus, Protonix, prednisone.  Admission weight 95.2 kg  Current weight 79.6 kg  Diet Order:   Diet Order             DIET DYS 3 Room service appropriate? Yes; Fluid consistency: Honey Thick  Diet effective now                   EDUCATION NEEDS:   Not appropriate for education at this time  Skin:  Skin Assessment: Skin Integrity Issues: Skin Integrity Issues:: Stage II, Other (Comment) Stage II: L & R buttocks, sacrum Other: MASD coccyx;  MARSI L face from ETT holder  Last BM:  7/18 type 7  Height:   Ht Readings from Last 1 Encounters:  03/17/21 5' 10.98" (1.803 m)    Weight:   Wt Readings from Last 1 Encounters:  03/22/21 79.6 kg    Ideal Body Weight:  78.2 kg  BMI:  Body mass index is 24.49 kg/m.  Estimated Nutritional Needs:   Kcal:  2400-2600 kcal  Protein:  130-145 grams  Fluid:  2.2-2.4 L    Lucas Mallow, RD, LDN, CNSC Please refer to Amion for contact information.

## 2021-03-22 NOTE — Progress Notes (Signed)
Passy-Muir Speaking Valve - Treatment Patient Details  Name: SEVYN DONELSON MRN: [id number] Date of Birth: 14-Dec-1975  Today's Date: 03/22/2021 Time: [id number] SLP Time Calculation (min) (ACUTE ONLY): 20 min  Past Medical History:  Past Medical History:  Diagnosis Date   Asthma    Diabetes mellitus without complication ([place])    Gout    Hyperlipidemia    Hypertension    Obesity    Past Surgical History:  Past Surgical History:  Procedure Laterality Date   BIOPSY  02/15/2021   Procedure: BIOPSY;  Surgeon: Ronnette Juniper, MD;  Location: [facility];  Service: Gastroenterology;;   COLONOSCOPY WITH PROPOFOL N/A 02/15/2021   Procedure: COLONOSCOPY WITH PROPOFOL;  Surgeon: Ronnette Juniper, MD;  [facility];  Service: Gastroenterology;  Laterality: N/A;   ESOPHAGOGASTRODUODENOSCOPY (EGD) WITH PROPOFOL N/A 02/15/2021   Procedure: ESOPHAGOGASTRODUODENOSCOPY (EGD) WITH PROPOFOL;  Surgeon: Ronnette Juniper, MD;  [facility];  Service: Gastroenterology;  Laterality: N/A;   GANGLION CYST EXCISION Right 02/26/2020   Procedure: EXCISION TOPHUS RIGHT FOOT;  Surgeon: Evelina Bucy, DPM;  Location: [facility];  Service: Podiatry;  Laterality: Right;   NO PAST SURGERIES     HPI:  45 y.o. male with a past medical history significant for HIV/AIDS, CKD, asthma, obesity, DM2 admitted for acute hypoxic respiratory failure with sepsis, requiring ventilator support on 7/1 and tracheostomy 7/13, CRRT.   Assessment / Plan / Recommendation Clinical Impression  Followed up for PMSV tolerance and pt education. Per RN, pt utilized PMSV when mom was visiting for 40+ minutes but requiring significant encouragement to use. Pt on trach collar, with some tracheal secretions at trach site, per RT not requiring frequent tracheal suction due to stronger reflexive cough. PMSV placed without difficulty. Vitals stable RR: 18, SPO2: 100, HR: 90. Pt initially not achieving phonation despite  repositioning attempt. SLP instructed for verbal counting task, pt able to achieve adequate phonation with clear intelligibility of speech. He often defaults to nonverbal gestures. SLP educated regarding significant benefits with PMSV use: restoration of upper respiratory connection for verbal communication, improved secretion management, likely improvements in swallow function. Pt agreeable for continued use following education. Recommend continue use during wake hours as tolerated, intermittent supervision.  SLP Visit Diagnosis: Aphonia (R49.1)    SLP Assessment  Patient needs continued Speech Lanaguage Pathology Services    Follow Up Recommendations  Other (comment) (TBD)    Frequency and Duration min 2x/week  2 weeks    PMSV Trial PMSV was placed for:  (15+ minutes) Able to redirect subglottic air through upper airway: Yes Able to Attain Phonation: Yes Voice Quality: Low vocal intensity Able to Expectorate Secretions: Yes Level of Secretion Expectoration with PMSV: Tracheal Breath Support for Phonation: Mildly decreased Intelligibility: Intelligible Respirations During Trial: 18 SpO2 During Trial: 100 % Pulse During Trial: 90 Behavior: Alert;Responsive to questions   Tracheostomy Tube  Additional Tracheostomy Tube Assessment Fenestrated: No Trach Collar Period: during wake hours Secretion Description: frothy Frequency of Tracheal Suctioning: minimal Level of Secretion Expectoration: Tracheal    Vent Dependency  Vent Dependent: No Vent Mode: Stand-by FiO2 (%): 28 %    Cuff Deflation Trial  GO Tolerated Cuff Deflation: Yes Length of Time for Cuff Deflation Trial:  (wake hours) Behavior: Alert;Responsive to questions Cuff Deflation Trial - Comments: deflated at baseline, tolerating well        [name] MA, CCC-SLP Acute Rehabilitation Services   03/22/2021, 11:52 AM

## 2021-03-22 NOTE — TOC Progression Note (Signed)
Transition of Care [facility]) - Progression Note    Patient Details  Name: Bruce Hunter MRN: [id number] Date of Birth: June 16, 1976  Transition of Care [facility]) CM/SW Contact  Milinda Antis, LCSWA Phone Number: 03/22/2021, 2:07 PM  Clinical Narrative:    CSW met with the patient's mother at bedside.  Ms. Bromwell inquired about whether CSW could assist with POA.  CSW informed Ms. Zappia that CSW could not assist with the paperwork, but would reach out to spiritual care to inquire about whether they assist.  CSW sent a message to the Chaplain on call to ask if they assist with POA paperwork.  CSW is awaiting a response.     Expected Discharge Plan: Home/Self Care Barriers to Discharge: Continued Medical Work up  Expected Discharge Plan and Services Expected Discharge Plan: Home/Self Care   Discharge Planning Services: CM Consult   Living arrangements for the past 2 months: Single Family Home                                       Social Determinants of Health (SDOH) Interventions    Readmission Risk Interventions No flowsheet data found.

## 2021-03-22 NOTE — Progress Notes (Signed)
Modified Barium Swallow Progress Note  Patient Details  Name: Bruce Hunter MRN: [id number] Date of Birth: Oct 18, 1975  Today's Date: 03/22/2021  Modified Barium Swallow completed.  Full report located under Chart Review in the Imaging Section.  Brief recommendations include the following:  Clinical Impression    Pt presents with a mild oral and mild to moderate pharyngeal dysphagia, likely of multifactorial etiology. PMSV in place during entire duration of procedure; pt tolerating well. Pt with mild oral residuals with all POs, eventually spilling to the valleculae post intitial swallow (cues for second volitional swallows cleared). Piecemeal swallowing noted with solid PO, pt with good mastication and baseline adequate dentition. Pharyngeally, pt with incomplete epiglottic deflection, reduced laryngeal vestibule closure, and diminished sensation. This allowed for laryngeal penetration of thin liquids (PAS-2,3 via cup), (PAS 3, 8 thins via straw) post swallow silent tracheal aspiration of thins as penetrates progressed down trachea, (PAS-2/3 nectar thick liquids via cup), and (PAS-2 puree during the swallow). Airway protection was intact with soft solid and honey thick liquids. Recommend conservative diet of honey thick liquids and dysphagia 3 (mechanical soft) consistencies with meds in puree (crush larger). Pt must utilize PMSV during all PO consumption. SLP to closely monitor for tolerance. Anticipate with extended time and PMSV usage, pt will be able to tolerate upgraded POs.     Swallow Evaluation Recommendations       SLP Diet Recommendations: Honey thick liquids;Dysphagia 3 (Mech soft) solids   Liquid Administration via: Cup   Medication Administration: Whole meds with puree (crush larger as needed)   Supervision: Staff to assist with self feeding;Full supervision/cueing for compensatory strategies   Compensations: Slow rate;Small sips/bites (PMSV use with all POs)   Postural  Changes: Seated upright at 90 degrees;Remain semi-upright after after feeds/meals (Comment)   Oral Care Recommendations: Oral care BID   Other Recommendations: Order thickener from pharmacy;Place PMSV during PO intake    [name] MA, CCC-SLP Acute Rehabilitation Services   03/22/2021,1:30 PM

## 2021-03-22 NOTE — Progress Notes (Signed)
Subjective:  UOP remains good, 2200 - hemodynamically stable-   crt down a little-  BUN stable    Objective Vital signs in last 24 hours: Vitals:   03/22/21 0400 03/22/21 0500 03/22/21 0551 03/22/21 0600  BP: 128/76 120/74    Pulse: 96 85 89   Resp: (!) 26 (!) 22 (!) 27   Temp:      TempSrc:      SpO2: 100% 100% 100%   Weight:    79.6 kg  Height:       Weight change:   Intake/Output Summary (Last 24 hours) at 03/22/2021 0659 Last data filed at 03/22/2021 0200 Gross per 24 hour  Intake 3897.96 ml  Output 2350 ml  Net 1547.96 ml    Assessment/ Plan: Pt is a 45 y.o. yo male with HIV/AIDS, DM, baseline CKD who was admitted on 03/04/2021 with acute hypoxic resp failure with sepsis and AKI  Assessment/Plan: 1. Renal-  reported baseline crt in the high 2's.  A on CRF in the setting of hypoxic resp failure and sepsis and also urinary retention.  Off and on CRRT-  most recently stopped on 7/15.  Since then making good urine and crt pretty stable-  actually down today.  No absolute indications for dialysis support-  that right IJ HD cath is 61 days old 2. VDRF-  trach-  on trach collar this AM- per CCM-  zyvox for PNA 3. Anemia- severe- on very low dose aranesp-  will inc-  and check [facility] labs-  last were low.  Probably needs transfusion today ?   4. HTN/volume-  BP reasonable- norvasc , clonidine and maintenance IVF   Louis Meckel    Labs: Basic Metabolic Panel: Recent Labs  Lab 03/18/21 1600 03/19/21 0428 03/20/21 1026 03/21/21 0451 03/22/21 0508  NA 135 137 139 136 145  K 4.4 3.9 3.9 4.0 4.0  CL 102 101 104 103 111  CO2 _0 21* 23  GLUCOSE 182* 215* 209* 235* 185*  BUN 43* 45* 97* 111* 113*  CREATININE 1.69* 1.56* 2.79* 2.90* 2.56*  CALCIUM 8.9 8.9 9.0 8.8* 8.9  PHOS 2.9 2.2*  --  7.3*  --    Liver Function Tests: Recent Labs  Lab 03/15/21 1116 03/15/21 1615 03/19/21 0428 03/21/21 0451 03/22/21 0508  AST 30  --  41  --  20  ALT 7  --  13   --  13  ALKPHOS 144*  --  90  --  63  BILITOT 0.4  --  0.6  --  0.6  PROT 6.7  --  7.5  --  6.8  ALBUMIN 1.7*   < > 2.1* 1.9* 1.8*   < > = values in this interval not displayed.   No results for input(s): LIPASE, AMYLASE in the last 168 hours. No results for input(s): AMMONIA in the last 168 hours. CBC: Recent Labs  Lab 03/18/21 0254 03/19/21 0428 03/20/21 1026 03/21/21 0451 03/22/21 0508  WBC 15.5* 19.4* 20.3* 18.1* 12.0*  NEUTROABS  --  14.2*  --   --   --   HGB 7.4* 7.5* 7.0* 7.2* 6.5*  HCT 25.8* 24.9* 24.0* 24.2* 21.8*  MCV 98.5 98.0 98.4 96.8 97.8  PLT 182 169 171 208 200   Cardiac Enzymes: No results for input(s): CKTOTAL, CKMB, CKMBINDEX, TROPONINI in the last 168 hours. CBG: Recent Labs  Lab 03/21/21 1506 03/21/21 1910 03/21/21 2303 03/22/21 0205 03/22/21 0302  GLUCAP 187* 204* 166*  112* 117*    Iron Studies: No results for input(s): IRON, TIBC, TRANSFERRIN, FERRITIN in the last 72 hours. Studies/Results: DG Chest Port 1 View  Result Date: 03/21/2021 CLINICAL DATA:  45 year old male with history of acute hypoxic respiratory failure. EXAM: PORTABLE CHEST 1 VIEW COMPARISON:  Chest x-ray 03/19/2021. FINDINGS: There is a right-sided internal jugular central venous catheter with tip terminating in the distal superior vena cava. A feeding tube is seen extending into the abdomen, however, the tip of the feeding tube extends below the lower margin of the image. Lung volumes are low. Patchy ill-defined opacities and widespread areas of interstitial prominence are again noted throughout the lungs bilaterally with relative sparing in the right lung base. Overall, aeration appears slightly worsened than examination from 2 days ago. No pleural effusions. No pneumothorax. Pulmonary vasculature is largely obscured. Mild cardiomegaly. Upper mediastinal contours are within normal limits. IMPRESSION: 1. Support apparatus, as above. 2. Slightly worsened aeration in the chest compared  to 2 days ago. Overall, findings are favored to reflect ARDS. Electronically Signed   By: Vinnie Langton M.D.   On: 03/21/2021 08:09   Medications: Infusions:  sodium chloride 250 mL (03/21/21 1408)   sodium chloride Stopped (03/19/21 2158)   sodium chloride 50 mL/hr at 03/21/21 1900   clindamycin (CLEOCIN) IV 900 mg (03/21/21 2257)   dexmedetomidine (PRECEDEX) IV infusion 0.5 mcg/kg/hr (03/22/21 0227)   dextrose 30 mL/hr at 03/22/21 0436    Scheduled Medications:  sodium chloride   Intravenous Once   sodium chloride   Intravenous Once   alteplase  2 mg Intracatheter Once   alteplase  2 mg Intracatheter Once   amLODipine  10 mg Per Tube Daily   chlorhexidine gluconate (MEDLINE KIT)  15 mL Mouth Rinse BID   Chlorhexidine Gluconate Cloth  6 each Topical Daily   clonazePAM  0.5 mg Per Tube BID   cloNIDine  0.1 mg Per Tube BID   darbepoetin (ARANESP) injection - NON-DIALYSIS  40 mcg Subcutaneous Q Sun-1800   dolutegravir  50 mg Per Tube Daily   feeding supplement (PIVOT 1.5 CAL)  1,000 mL Per Tube Q24H   gabapentin  100 mg Per Tube Q12H   heparin injection (subcutaneous)  5,000 Units Subcutaneous Q8H   insulin aspart  0-15 Units Subcutaneous Q4H   insulin glargine  5 Units Subcutaneous Daily   ipratropium-albuterol  3 mL Nebulization TID   lamiVUDine  300 mg Per Tube Daily   lidocaine  1 patch Transdermal Q24H   linezolid  600 mg Per Tube Q12H   mouth rinse  15 mL Mouth Rinse 10 times per day   oxyCODONE  5 mg Per Tube Q12H   pantoprazole sodium  40 mg Per Tube QHS   predniSONE  20 mg Per Tube Q breakfast   primaquine  30 mg Per Tube Daily   sodium chloride flush  10-40 mL Intracatheter Q12H    have reviewed scheduled and prn medications.  Physical Exam: General: alert, c/o thirst Heart: RRR Lungs: CBS bilat Abdomen: soft, non tender Extremities: min edema if any Dialysis Access: right IJ cath in for 15 days-  will need to be removed soon-  will leave in today-  would  love to be able to declare dialysis free     03/22/2021,6:59 AM  LOS: 18 days

## 2021-03-22 NOTE — Progress Notes (Signed)
Patient has become more somnolent, withdrawn, and flat since this mornings assessment. Patient still has equal and reactive pupils. Grips and responses of extremities are unchanged with no focal deficits. Patient is still A&O x4 and able to answer questions, but needs more encouragement to give answers. MD was notified, no new orders at this time. Will continue to monitor and pass along to night shift.

## 2021-03-22 NOTE — Progress Notes (Addendum)
        Subjective:   See Addendum to PGY2 Note.   Studies/Results:   LOS: 18 days   Alcide Evener 03/22/2021, 4:26 PM

## 2021-03-22 NOTE — Progress Notes (Addendum)
Subjective: Patient is clinically improving. He denies any pain. He is able to speak with his PMV.   Antibiotics:  Anti-infectives (From admission, onward)    Start     Dose/Rate Route Frequency Ordered Stop   03/22/21 1545  lamiVUDine (EPIVIR) tablet 300 mg        300 mg Oral Daily 03/22/21 1446     03/21/21 1430  lamiVUDine (EPIVIR) 10 MG/ML solution 300 mg  Status:  Discontinued        300 mg Per Tube Daily 03/21/21 1330 03/22/21 1446   03/18/21 1530  linezolid (ZYVOX) tablet 600 mg        600 mg Per Tube Every 12 hours 03/18/21 1431     03/18/21 1515  linezolid (ZYVOX) 100 MG/5ML suspension 600 mg  Status:  Discontinued        600 mg Per Tube Every 12 hours 03/18/21 1427 03/18/21 1431   03/17/21 1000  primaquine tablet 30 mg        30 mg Per Tube Daily 03/17/21 0856     03/16/21 1000  ceFAZolin (ANCEF) IVPB 1 g/50 mL premix  Status:  Discontinued        1 g 100 mL/hr over 30 Minutes Intravenous Every 12 hours 03/15/21 1251 03/15/21 1301   03/16/21 1000  ceFAZolin (ANCEF) 2 g in sodium chloride 0.9 % 100 mL IVPB  Status:  Discontinued        2 g 200 mL/hr over 30 Minutes Intravenous Every 12 hours 03/15/21 1302 03/18/21 1427   03/13/21 2200  fluconazole (DIFLUCAN) IVPB 400 mg        400 mg 100 mL/hr over 120 Minutes Intravenous Every 24 hours 03/13/21 1047 03/19/21 0031   03/13/21 1800  acyclovir (ZOVIRAX) 900 mg in dextrose 5 % 150 mL IVPB  Status:  Discontinued        900 mg 168 mL/hr over 60 Minutes Intravenous Every 24 hours 03/13/21 0958 03/13/21 1047   03/13/21 1800  acyclovir (ZOVIRAX) 900 mg in dextrose 5 % 150 mL IVPB  Status:  Discontinued        900 mg 168 mL/hr over 60 Minutes Intravenous Every 12 hours 03/13/21 1047 03/14/21 1700   03/13/21 1000  emtricitabine-tenofovir AF (DESCOVY) 200-25 MG per tablet 1 tablet  Status:  Discontinued        1 tablet Per Tube Daily 03/12/21 1350 03/21/21 1330   03/12/21 2200  vancomycin (VANCOREADY) IVPB 750 mg/150 mL   Status:  Discontinued        750 mg 150 mL/hr over 60 Minutes Intravenous Every 12 hours 03/12/21 1249 03/12/21 1806   03/12/21 2200  clindamycin (CLEOCIN) IVPB 900 mg        900 mg 100 mL/hr over 30 Minutes Intravenous Every 8 hours 03/12/21 1729     03/12/21 1815  primaquine tablet 30 mg  Status:  Discontinued        30 mg Oral Daily 03/12/21 1729 03/17/21 0856   03/12/21 1400  vancomycin (VANCOREADY) IVPB 1000 mg/200 mL        1,000 mg 200 mL/hr over 60 Minutes Intravenous  Once 03/12/21 1249 03/12/21 1540   03/12/21 1400  acyclovir (ZOVIRAX) 900 mg in dextrose 5 % 150 mL IVPB  Status:  Discontinued        900 mg 168 mL/hr over 60 Minutes Intravenous Every 12 hours 03/12/21 1249 03/13/21 0958   03/12/21 1300  cefTRIAXone (ROCEPHIN) 2 g  in sodium chloride 0.9 % 100 mL IVPB  Status:  Discontinued        2 g 200 mL/hr over 30 Minutes Intravenous Every 12 hours 03/12/21 1203 03/15/21 1251   03/10/21 2200  fluconazole (DIFLUCAN) IVPB 200 mg  Status:  Discontinued        200 mg 100 mL/hr over 60 Minutes Intravenous Every 24 hours 03/10/21 1333 03/13/21 1047   03/10/21 1800  ceFAZolin (ANCEF) IVPB 2g/100 mL premix  Status:  Discontinued        2 g 200 mL/hr over 30 Minutes Intravenous Every 8 hours 03/10/21 1325 03/12/21 1203   03/09/21 2200  ceFAZolin (ANCEF) IVPB 2g/100 mL premix  Status:  Discontinued        2 g 200 mL/hr over 30 Minutes Intravenous Every 12 hours 03/09/21 1445 03/10/21 1325   03/09/21 2000  ceFAZolin (ANCEF) IVPB 2g/100 mL premix  Status:  Discontinued        2 g 200 mL/hr over 30 Minutes Intravenous Every 8 hours 03/09/21 1440 03/09/21 1445   03/09/21 1645  dapsone tablet 100 mg  Status:  Discontinued        100 mg Per Tube Daily 03/09/21 1556 03/12/21 1729   03/07/21 2200  primaquine tablet 30 mg  Status:  Discontinued        30 mg Per Tube Every 24 hours 03/07/21 0915 03/09/21 1439   03/07/21 1000  dolutegravir (TIVICAY) tablet 50 mg        50 mg Per Tube Daily  03/06/21 1132     03/07/21 1000  lamiVUDine (EPIVIR) tablet 150 mg  Status:  Discontinued        150 mg Per Tube Daily 03/06/21 1324 03/12/21 1350   03/06/21 2200  fluconazole (DIFLUCAN) IVPB 400 mg  Status:  Discontinued        400 mg 100 mL/hr over 120 Minutes Intravenous Every 24 hours 03/06/21 1251 03/10/21 1333   03/06/21 1400  ceFEPIme (MAXIPIME) 2 g in sodium chloride 0.9 % 100 mL IVPB  Status:  Discontinued        2 g 200 mL/hr over 30 Minutes Intravenous Every 12 hours 03/06/21 1251 03/09/21 1439   03/05/21 2130  fluconazole (DIFLUCAN) IVPB 200 mg  Status:  Discontinued        200 mg 100 mL/hr over 60 Minutes Intravenous Every 24 hours 03/05/21 0810 03/06/21 1251   03/05/21 1000  dolutegravir (TIVICAY) tablet 50 mg  Status:  Discontinued        50 mg Oral Daily 03/04/21 2214 03/06/21 1132   03/05/21 1000  lamiVUDine (EPIVIR) tablet 300 mg  Status:  Discontinued        300 mg Oral Daily 03/04/21 2217 03/04/21 2217   03/05/21 1000  lamiVUDine (EPIVIR) tablet 150 mg  Status:  Discontinued        150 mg Oral Daily 03/04/21 2217 03/06/21 1324   03/04/21 2200  clindamycin (CLEOCIN) IVPB 900 mg  Status:  Discontinued        900 mg 100 mL/hr over 30 Minutes Intravenous Every 8 hours 03/04/21 2157 03/09/21 1439   03/04/21 2200  primaquine tablet 30 mg  Status:  Discontinued        30 mg Oral Every 24 hours 03/04/21 2157 03/07/21 0915   03/04/21 2145  vancomycin (VANCOREADY) IVPB 1500 mg/300 mL        1,500 mg 150 mL/hr over 120 Minutes Intravenous  Once 03/04/21 2144 03/05/21 0050  03/04/21 2145  ceFEPIme (MAXIPIME) 2 g in sodium chloride 0.9 % 100 mL IVPB  Status:  Discontinued        2 g 200 mL/hr over 30 Minutes Intravenous Every 24 hours 03/04/21 2144 03/06/21 1251   03/04/21 2143  vancomycin variable dose per unstable renal function (pharmacist dosing)  Status:  Discontinued         Does not apply See admin instructions 03/04/21 2144 03/06/21 0813   03/04/21 2130  fluconazole  (DIFLUCAN) IVPB 400 mg  Status:  Discontinued        400 mg 100 mL/hr over 120 Minutes Intravenous Every 24 hours 03/04/21 2126 03/05/21 0810   03/04/21 1915  cefTRIAXone (ROCEPHIN) 1 g in sodium chloride 0.9 % 100 mL IVPB        1 g 200 mL/hr over 30 Minutes Intravenous  Once 03/04/21 1914 03/04/21 2045   03/04/21 1915  azithromycin (ZITHROMAX) 500 mg in sodium chloride 0.9 % 250 mL IVPB        500 mg 250 mL/hr over 60 Minutes Intravenous  Once 03/04/21 1914 03/04/21 2201       Medications: Scheduled Meds:  sodium chloride   Intravenous Once   alteplase  2 mg Intracatheter Once   alteplase  2 mg Intracatheter Once   amLODipine  10 mg Per Tube Daily   chlorhexidine gluconate (MEDLINE KIT)  15 mL Mouth Rinse BID   Chlorhexidine Gluconate Cloth  6 each Topical Daily   clonazePAM  0.5 mg Per Tube BID   cloNIDine  0.2 mg Per Tube TID   [START ON 03/28/2021] darbepoetin (ARANESP) injection - NON-DIALYSIS  200 mcg Subcutaneous Q Sun-1800   dolutegravir  50 mg Per Tube Daily   gabapentin  100 mg Oral Q12H   heparin injection (subcutaneous)  5,000 Units Subcutaneous Q8H   insulin aspart  0-15 Units Subcutaneous Q4H   insulin glargine  5 Units Subcutaneous Daily   ipratropium-albuterol  3 mL Nebulization TID   lamiVUDine  300 mg Oral Daily   lidocaine  1 patch Transdermal Q24H   linezolid  600 mg Per Tube Q12H   mouth rinse  15 mL Mouth Rinse 10 times per day   oxyCODONE  5 mg Oral Q8H   pantoprazole  40 mg Oral QHS   predniSONE  20 mg Oral Q breakfast   primaquine  30 mg Per Tube Daily   sodium chloride flush  10-40 mL Intracatheter Q12H   Continuous Infusions:  sodium chloride 250 mL (03/21/21 1408)   sodium chloride Stopped (03/19/21 2158)   sodium chloride 50 mL/hr at 03/22/21 1500   clindamycin (CLEOCIN) IV 900 mg (03/22/21 1508)   dexmedetomidine (PRECEDEX) IV infusion Stopped (03/22/21 0754)   PRN Meds:.acetaminophen, albuterol, docusate, fentaNYL (SUBLIMAZE) injection,  heparin, labetalol, lip balm, midazolam, ondansetron (ZOFRAN) IV, polyethylene glycol, sodium chloride flush    Objective: Weight change:   Intake/Output Summary (Last 24 hours) at 03/22/2021 1558 Last data filed at 03/22/2021 1500 Gross per 24 hour  Intake 2390.27 ml  Output 3000 ml  Net -609.73 ml    Blood pressure (!) 160/98, pulse (!) 112, temperature 98.3 F (36.8 C), temperature source Oral, resp. rate (!) 25, height 5' 10.98" (1.803 m), weight 79.6 kg, SpO2 100 %. Temp:  [98.3 F (36.8 C)-99.2 F (37.3 C)] 98.3 F (36.8 C) (07/18 1502) Pulse Rate:  [85-122] 112 (07/18 1500) Resp:  [12-36] 25 (07/18 1525) BP: (112-160)/(74-110) 160/98 (07/18 1500) SpO2:  [90 %-100 %] 100 % (  07/18 1500) FiO2 (%):  [28 %] 28 % (07/18 1525) Weight:  [79.6 kg] 79.6 kg (07/18 0600)  Physical Exam: Physical Exam Constitutional:      Appearance: He is obese.  HENT:     Head: Normocephalic and atraumatic.  Eyes:     Conjunctiva/sclera: Conjunctivae normal.  Neck:     Trachea: Tracheostomy present.  Cardiovascular:     Rate and Rhythm: Normal rate and regular rhythm.  Pulmonary:     Effort: Pulmonary effort is normal. No respiratory distress.     Breath sounds: No stridor. No wheezing.  Abdominal:     General: There is no distension.     Palpations: Abdomen is soft. There is no mass.     Tenderness: There is no abdominal tenderness.  Musculoskeletal:        General: Normal range of motion.     Cervical back: Normal range of motion and neck supple.  Skin:    General: Skin is warm and dry.     Findings: No erythema or rash.  Neurological:     General: No focal deficit present.     Mental Status: He is alert and oriented to person, place, and time.  Psychiatric:        Mood and Affect: Mood normal.        Behavior: Behavior normal.        Thought Content: Thought content normal.        Judgment: Judgment normal.     CBC:    BMET Recent Labs    03/21/21 0451 03/22/21 0508   NA 136 145  K 4.0 4.0  CL 103 111  CO2 21* 23  GLUCOSE 235* 185*  BUN 111* 113*  CREATININE 2.90* 2.56*  CALCIUM 8.8* 8.9      Liver Panel  Recent Labs    03/21/21 0451 03/22/21 0508  PROT  --  6.8  ALBUMIN 1.9* 1.8*  AST  --  20  ALT  --  13  ALKPHOS  --  63  BILITOT  --  0.6        Sedimentation Rate No results for input(s): ESRSEDRATE in the last 72 hours. C-Reactive Protein No results for input(s): CRP in the last 72 hours.  Micro Results: Recent Results (from the past 720 hour(s))  Culture, blood (routine x 2)     Status: None   Collection Time: 03/04/21  6:14 PM   Specimen: BLOOD LEFT FOREARM  Result Value Ref Range Status   Specimen Description   Final    BLOOD LEFT FOREARM Performed at [facility] [address] [place], [place] [address]    Special Requests   Final    BOTTLES DRAWN AEROBIC AND ANAEROBIC Blood Culture adequate volume Performed at [facility] [address] [place], [place] [address]    Culture   Final    NO GROWTH 5 DAYS Performed at [facility], [place] [address] [place], [place] [address]    Report Status 03/09/2021 FINAL  Final  Resp Panel by RT-PCR (Flu A&B, Covid) Nasopharyngeal Swab     Status: None   Collection Time: 03/04/21  7:52 PM   Specimen: Nasopharyngeal Swab; Nasopharyngeal(NP) swabs in vial transport medium  Result Value Ref Range Status   SARS Coronavirus 2 by RT PCR NEGATIVE NEGATIVE Final    Comment: (NOTE) SARS-CoV-2 target nucleic acids are NOT DETECTED.  The SARS-CoV-2 RNA is generally detectable in upper respiratory specimens during the  acute phase of infection. The lowest concentration of SARS-CoV-2 viral copies this assay can detect is 138 copies/mL. A negative result does not preclude SARS-Cov-2 infection and should not be used as the sole basis for treatment or other patient management decisions. A negative result may occur with  improper specimen  collection/handling, submission of specimen other than nasopharyngeal swab, presence of viral mutation(s) within the areas targeted by this assay, and inadequate number of viral copies(<138 copies/mL). A negative result must be combined with clinical observations, patient history, and epidemiological information. The expected result is Negative.  Fact Sheet for Patients:  EntrepreneurPulse.com.au  Fact Sheet for Healthcare Providers:  IncredibleEmployment.be  This test is no t yet approved or cleared by the [place] FDA and  has been authorized for detection and/or diagnosis of SARS-CoV-2 by FDA under an Emergency Use Authorization (EUA). This EUA will remain  in effect (meaning this test can be used) for the duration of the COVID-19 declaration under Section 564(b)(1) of the Act, 21 U.S.C.section 360bbb-3(b)(1), unless the authorization is terminated  or revoked sooner.       Influenza A by PCR NEGATIVE NEGATIVE Final   Influenza B by PCR NEGATIVE NEGATIVE Final    Comment: (NOTE) The Xpert Xpress SARS-CoV-2/FLU/RSV plus assay is intended as an aid in the diagnosis of influenza from Nasopharyngeal swab specimens and should not be used as a sole basis for treatment. Nasal washings and aspirates are unacceptable for Xpert Xpress SARS-CoV-2/FLU/RSV testing.  Fact Sheet for Patients: EntrepreneurPulse.com.au  Fact Sheet for Healthcare Providers: IncredibleEmployment.be  This test is not yet approved or cleared by the [place] FDA and has been authorized for detection and/or diagnosis of SARS-CoV-2 by FDA under an Emergency Use Authorization (EUA). This EUA will remain in effect (meaning this test can be used) for the duration of the COVID-19 declaration under Section 564(b)(1) of the Act, 21 U.S.C. section 360bbb-3(b)(1), unless the authorization is terminated or revoked.  Performed at [facility], [place] [address] [place], [place] [address]   MRSA PCR Screening     Status: None   Collection Time: 03/05/21 10:41 AM  Result Value Ref Range Status   MRSA by PCR NEGATIVE NEGATIVE Final    Comment:        The GeneXpert MRSA Assay (FDA approved for NASAL specimens only), is one component of a comprehensive MRSA colonization surveillance program. It is not intended to diagnose MRSA infection nor to guide or monitor treatment for MRSA infections. Performed at [facility], [place] [address] [place], [place] [address]   Respiratory (~20 pathogens) panel by PCR     Status: None   Collection Time: 03/05/21 10:42 AM   Specimen: Nasopharyngeal Swab; Respiratory  Result Value Ref Range Status   Adenovirus NOT DETECTED NOT DETECTED Final   Coronavirus 229E NOT DETECTED NOT DETECTED Final    Comment: (NOTE) The Coronavirus on the Respiratory Panel, DOES NOT test for the novel  Coronavirus (2019 nCoV)    Coronavirus HKU1 NOT DETECTED NOT DETECTED Final   Coronavirus NL63 NOT DETECTED NOT DETECTED Final   Coronavirus OC43 NOT DETECTED NOT DETECTED Final   Metapneumovirus NOT DETECTED NOT DETECTED Final   Rhinovirus / Enterovirus NOT DETECTED NOT DETECTED Final   Influenza A NOT DETECTED NOT DETECTED Final   Influenza B NOT DETECTED NOT DETECTED Final   Parainfluenza Virus 1 NOT DETECTED NOT DETECTED Final   Parainfluenza Virus 2 NOT DETECTED NOT DETECTED Final   Parainfluenza  Virus 3 NOT DETECTED NOT DETECTED Final   Parainfluenza Virus 4 NOT DETECTED NOT DETECTED Final   Respiratory Syncytial Virus NOT DETECTED NOT DETECTED Final   Bordetella pertussis NOT DETECTED NOT DETECTED Final   Bordetella Parapertussis NOT DETECTED NOT DETECTED Final   Chlamydophila pneumoniae NOT DETECTED NOT DETECTED Final   Mycoplasma pneumoniae NOT DETECTED NOT DETECTED Final    Comment: Performed at [facility], [place] [address] [place], [place] [address]   Gastrointestinal Panel by PCR , Stool     Status: None   Collection Time: 03/05/21  6:28 PM   Specimen: Stool  Result Value Ref Range Status   Campylobacter species NOT DETECTED NOT DETECTED Final   Plesimonas shigelloides NOT DETECTED NOT DETECTED Final   Salmonella species NOT DETECTED NOT DETECTED Final   Yersinia enterocolitica NOT DETECTED NOT DETECTED Final   Vibrio species NOT DETECTED NOT DETECTED Final   Vibrio cholerae NOT DETECTED NOT DETECTED Final   Enteroaggregative E coli (EAEC) NOT DETECTED NOT DETECTED Final   Enteropathogenic E coli (EPEC) NOT DETECTED NOT DETECTED Final   Enterotoxigenic E coli (ETEC) NOT DETECTED NOT DETECTED Final   Shiga like toxin producing E coli (STEC) NOT DETECTED NOT DETECTED Final   Shigella/Enteroinvasive E coli (EIEC) NOT DETECTED NOT DETECTED Final   Cryptosporidium NOT DETECTED NOT DETECTED Final   Cyclospora cayetanensis NOT DETECTED NOT DETECTED Final   Entamoeba histolytica NOT DETECTED NOT DETECTED Final   Giardia lamblia NOT DETECTED NOT DETECTED Final   Adenovirus F40/41 NOT DETECTED NOT DETECTED Final   Astrovirus NOT DETECTED NOT DETECTED Final   Norovirus GI/GII NOT DETECTED NOT DETECTED Final   Rotavirus A NOT DETECTED NOT DETECTED Final   Sapovirus (I, II, IV, and V) NOT DETECTED NOT DETECTED Final    Comment: Performed at [facility], [place] [place], [place] [address]  Pneumocystis smear by DFA     Status: None   Collection Time: 03/06/21  1:09 PM   Specimen: Sputum; Respiratory  Result Value Ref Range Status   Specimen Source-PJSRC SPUTUM  Final   Pneumocystis jiroveci Ag NEGATIVE  Final    Comment: Performed at [place] Performed at [facility] Performed at [facility], [place] [address] [place], [place] [address]   Culture, Respiratory w Gram Stain     Status: None   Collection Time: 03/06/21  1:10 PM   Specimen: Tracheal Aspirate; Respiratory   Result Value Ref Range Status   Specimen Description   Final    TRACHEAL ASPIRATE Performed at [facility] [address], [place], [place] [address]    Special Requests   Final    Immunocompromised Performed at [facility], [place] [address], [place], [place] [address]    Gram Stain   Final    FEW WBC PRESENT,BOTH PMN AND MONONUCLEAR RARE SQUAMOUS EPITHELIAL CELLS PRESENT RARE GRAM POSITIVE RODS RARE GRAM POSITIVE COCCI IN PAIRS Performed at [facility], [place] [address] [place],  [address]    Culture RARE STAPHYLOCOCCUS AUREUS  Final   Report Status 03/09/2021 FINAL  Final   Organism ID, Bacteria STAPHYLOCOCCUS AUREUS  Final      Susceptibility   Staphylococcus aureus - MIC*    CIPROFLOXACIN <=0.5 SENSITIVE Sensitive     ERYTHROMYCIN <=0.25 SENSITIVE Sensitive     GENTAMICIN <=0.5 SENSITIVE Sensitive     OXACILLIN <=0.25 SENSITIVE Sensitive  TETRACYCLINE >=16 RESISTANT Resistant     VANCOMYCIN <=0.5 SENSITIVE Sensitive     TRIMETH/SULFA <=10 SENSITIVE Sensitive     CLINDAMYCIN <=0.25 SENSITIVE Sensitive     RIFAMPIN <=0.5 SENSITIVE Sensitive     Inducible Clindamycin NEGATIVE Sensitive     * RARE STAPHYLOCOCCUS AUREUS  Blastomyces Antigen     Status: None   Collection Time: 03/12/21  5:55 PM   Specimen: Blood  Result Value Ref Range Status   Blastomyces Antigen None Detected None Detected ng/mL Final    Comment: (NOTE) Results reported as ng/mL in 0.2 - 14.7 ng/mL range Results above the limit of detection but below 0.2 ng/mL are reported as 'Positive, Below the Limit of Quantification' Results above 14.7 ng/mL are reported as 'Positive, Above the Limit of Quantification'    Specimen Type SERUM  Final    Comment: (NOTE) Performed At: [facility] [place], [place] [id number] Bruce Donath MD [id number]:[id number]   Virus culture     Status: None   Collection Time: 03/12/21  7:08 PM    Specimen: CSF  Result Value Ref Range Status   Viral Culture Comment  Final    Comment: (NOTE) Preliminary Report: No virus isolated at 4 days.  Next report to follow after 7 days. Performed At: [facility] [place], [place] [id number] Rush Farmer MD [id number]    Source of Sample BLOOD  Final    Comment: Performed at [facility], [place] [address] [place], [place] [address]  Virus culture     Status: None   Collection Time: 03/13/21  4:42 PM   Specimen: PATH Cytology CSF; Cerebrospinal Fluid  Result Value Ref Range Status   Viral Culture Comment  Final    Comment: (NOTE) Preliminary Report: No virus isolated at 4 days.  Next report to follow after 7 days. Performed At: [facility] [place], [place] [id number] Rush Farmer MD [id number]    Source of Sample CSF  Final    Comment: Performed at [facility], [place] [address] [place], [place] [address]  CSF culture w Gram Stain     Status: None   Collection Time: 03/13/21  4:42 PM   Specimen: PATH Cytology CSF; Cerebrospinal Fluid  Result Value Ref Range Status   Specimen Description   Final    CSF Performed at [facility] [address], [place], [place] [address]    Special Requests   Final    NONE Performed at [facility], [place] [address] [place], [place] [address]    Gram Stain   Final    NO ORGANISMS SEEN Gram Stain Report Called to,Read Back By and Verified With: ETHAN RN AT 8182 ON 03/13/21 BY S.VANHOORNE Performed at [facility], [place] [address] [place], [place] [address]    Culture   Final    NO GROWTH 3 DAYS Performed at [facility], [place] [address], [place], [place] [address]    Report Status 03/17/2021 FINAL  Final  Fungus Culture With Stain     Status: Abnormal   Collection Time: 03/13/21  4:42 PM   Specimen: PATH Cytology CSF; Cerebrospinal Fluid   Result Value Ref Range Status   Fungus Stain QNSTST (A)  Final    Comment: (NOTE) Test not performed. Insufficient specimen to perform or complete analysis.      Darrick Meigs S. was notified 03/17/2021.    Fungus (Mycology)  Culture QNSTST (A)  Final    Comment: (NOTE) Test not performed. Insufficient specimen to perform or complete analysis.      Darrick Meigs S. was notified 03/17/2021. Performed At: [facility] [place], [place] [id number] Rush Farmer MD [id number]    Fungal Source CSF  Final    Comment: Performed at [facility] [place] [address], [place], [place] [address]  Culture, fungus without smear     Status: None (Preliminary result)   Collection Time: 03/13/21  4:42 PM   Specimen: PATH Cytology CSF; Cerebrospinal Fluid  Result Value Ref Range Status   Specimen Description   Final    CSF Performed at [facility] [address] [place], [place] [address]    Special Requests   Final    NONE Performed at [facility], [place] [address] [place], [place] [address]    Culture   Final    NO FUNGUS ISOLATED AFTER 9 DAYS Performed at [facility], [place] [address], [place], [place] [address]    Report Status PENDING  Incomplete  Culture, Respiratory w Gram Stain     Status: None   Collection Time: 03/14/21  3:15 AM   Specimen: Tracheal Aspirate; Respiratory  Result Value Ref Range Status   Specimen Description   Final    TRACHEAL ASPIRATE Performed at [place] [address] [place], [place] [address]    Special Requests   Final    NONE Performed at [facility], [place] [address] [place], [place] [address]    Gram Stain   Final    MODERATE WBC PRESENT,BOTH PMN AND MONONUCLEAR NO ORGANISMS SEEN    Culture   Final    NO GROWTH 2 DAYS Performed at [facility], [place] [address], [place], [place] [address]    Report Status 03/16/2021  FINAL  Final  Culture, Respiratory w Gram Stain     Status: None   Collection Time: 03/16/21  9:40 AM   Specimen: Bronchoalveolar Lavage; Respiratory  Result Value Ref Range Status   Specimen Description   Final    BRONCHIAL ALVEOLAR LAVAGE Performed at [facility] [address], [place], [place] [address]    Special Requests   Final    NONE Performed at [facility], [place] [address] [place], [place] [address]    Gram Stain   Final    FEW WBC PRESENT,BOTH PMN AND MONONUCLEAR NO ORGANISMS SEEN    Culture   Final    RARE Normal respiratory flora-no Staph aureus or Pseudomonas seen Performed at [facility], [address], [place], [place] [address]    Report Status 03/18/2021 FINAL  Final  Fungus Culture With Stain     Status: None (Preliminary result)   Collection Time: 03/16/21  9:40 AM   Specimen: Bronchoalveolar Lavage  Result Value Ref Range Status   Fungus Stain Final report  Final    Comment: (NOTE) Performed At: [facility] [place], [place] [id number] Rush Farmer MD [id number]    Fungus (Mycology) Culture PENDING  Incomplete   Fungal Source BRONCHIAL ALVEOLAR LAVAGE  Final    Comment: Performed at [facility], [place] [address] [place], [place] [address]  Pneumocystis smear by DFA     Status: None   Collection Time: 03/16/21  9:40 AM   Specimen: Bronchoalveolar Lavage; Respiratory  Result Value Ref Range Status  Specimen Source-PJSRC BRONCHIAL ALVEOLAR LAVAGE  Final   Pneumocystis jiroveci Ag NEGATIVE  Final    Comment: Performed at [facility] [address] [place], [place] [address]  Fungus Culture Result     Status: None   Collection Time: 03/16/21  9:40 AM  Result Value Ref Range Status   Result 1 Comment  Final    Comment: (NOTE) KOH/Calcofluor preparation:  no fungus observed. Performed At: [facility] [place], [place] [id number] Rush Farmer MD QD:[id number]   Fungus Culture With Stain     Status: None (Preliminary result)   Collection Time: 03/16/21  9:41 AM   Specimen: Bronchoalveolar Lavage  Result Value Ref Range Status   Fungus Stain Final report  Final    Comment: (NOTE) Performed At: [facility] [address] [place], [place] [id number] Rush Farmer MD RX:[id number]    Fungus (Mycology) Culture PENDING  Incomplete   Fungal Source BRONCHIAL ALVEOLAR LAVAGE  Final    Comment: Performed at [facility], [place] [address], [place], [place] [address]  Culture, Respiratory w Gram Stain     Status: None   Collection Time: 03/16/21  9:41 AM   Specimen: Bronchoalveolar Lavage; Respiratory  Result Value Ref Range Status   Specimen Description   Final    BRONCHIAL ALVEOLAR LAVAGE Performed at [facility] [address], [place], [place] [address]    Special Requests   Final    NONE Performed at [facility], [place] [address] [place], [place] [address]    Gram Stain   Final    NO WBC SEEN NO ORGANISMS SEEN Performed at [facility], [place] [address] [place], [place] [address]    Culture RARE STAPHYLOCOCCUS EPIDERMIDIS  Final   Report Status 03/18/2021 FINAL  Final   Organism ID, Bacteria STAPHYLOCOCCUS EPIDERMIDIS  Final      Susceptibility   Staphylococcus epidermidis - MIC*    CIPROFLOXACIN 4 RESISTANT Resistant     ERYTHROMYCIN >=8 RESISTANT Resistant     GENTAMICIN <=0.5 SENSITIVE Sensitive     OXACILLIN >=4 RESISTANT Resistant     TETRACYCLINE >=16 RESISTANT Resistant     VANCOMYCIN 1 SENSITIVE Sensitive     TRIMETH/SULFA 40 SENSITIVE Sensitive     CLINDAMYCIN >=8 RESISTANT Resistant     RIFAMPIN 2 INTERMEDIATE Intermediate     Inducible Clindamycin NEGATIVE Sensitive     * RARE STAPHYLOCOCCUS EPIDERMIDIS  Fungus Culture Result     Status: None   Collection Time: 03/16/21  9:41 AM  Result  Value Ref Range Status   Result 1 Comment  Final    Comment: (NOTE) KOH/Calcofluor preparation:  no fungus observed. Performed At: [facility] [place], [place] [id number] Rush Farmer MD OM:[id number]     Studies/Results: DG Chest Port 1 View  Result Date: 03/21/2021 CLINICAL DATA:  45 year old male with history of acute hypoxic respiratory failure. EXAM: PORTABLE CHEST 1 VIEW COMPARISON:  Chest x-ray 03/19/2021. FINDINGS: There is a right-sided internal jugular central venous catheter with tip terminating in the distal superior vena cava. A feeding tube is seen extending into the abdomen, however, the tip of the feeding tube extends below the lower margin of the image. Lung volumes are low. Patchy ill-defined opacities and widespread areas of interstitial prominence are again noted throughout the lungs bilaterally with relative sparing in the right lung base. Overall, aeration appears slightly worsened than examination from 2 days ago.  No pleural effusions. No pneumothorax. Pulmonary vasculature is largely obscured. Mild cardiomegaly. Upper mediastinal contours are within normal limits. IMPRESSION: 1. Support apparatus, as above. 2. Slightly worsened aeration in the chest compared to 2 days ago. Overall, findings are favored to reflect ARDS. Electronically Signed   By: Vinnie Langton M.D.   On: 03/21/2021 08:09   DG Swallowing Func-Speech Pathology  Result Date: 03/22/2021 Formatting of this result is different from the original. Objective Swallowing Evaluation: Type of Study: MBS-Modified Barium Swallow Study  Patient Details Name: Bruce Hunter MRN: [id number] Date of Birth: 03-31-1976 Today's Date: 03/22/2021 Time: SLP Start Time (ACUTE ONLY): 1242 -SLP Stop Time (ACUTE ONLY): 4174 SLP Time Calculation (min) (ACUTE ONLY): 10 min Past Medical History: Past Medical History: Diagnosis Date  Asthma   Diabetes mellitus without complication ([place])   Gout   Hyperlipidemia    Hypertension   Obesity  Past Surgical History: Past Surgical History: Procedure Laterality Date  BIOPSY  02/15/2021  Procedure: BIOPSY;  Surgeon: Ronnette Juniper, MD;  Location: [facility];  Service: Gastroenterology;;  COLONOSCOPY WITH PROPOFOL N/A 02/15/2021  Procedure: COLONOSCOPY WITH PROPOFOL;  Surgeon: Ronnette Juniper, MD;  Location: [facility];  Service: Gastroenterology;  Laterality: N/A;  ESOPHAGOGASTRODUODENOSCOPY (EGD) WITH PROPOFOL N/A 02/15/2021  Procedure: ESOPHAGOGASTRODUODENOSCOPY (EGD) WITH PROPOFOL;  Surgeon: Ronnette Juniper, MD;  Location: [facility];  Service: Gastroenterology;  Laterality: N/A;  GANGLION CYST EXCISION Right 02/26/2020  Procedure: EXCISION TOPHUS RIGHT FOOT;  Surgeon: Evelina Bucy, DPM;  Location: [facility];  Service: Podiatry;  Laterality: Right;  NO PAST SURGERIES   HPI: 45 y.o. male with a past medical history significant for HIV/AIDS, CKD, asthma, obesity, DM2 admitted for acute hypoxic respiratory failure with sepsis, requiring ventilator support on 7/1 and tracheostomy 7/13, CRRT.  Subjective: alert, upright in chair for procedure with PMSV in place Assessment / Plan / Recommendation CHL IP CLINICAL IMPRESSIONS 03/22/2021 Clinical Impression Pt presents with a mild oral and mild to moderate pharyngeal dysphagia, likely of multifactorial etiology. PMSV in place during entire duration of procedure; pt tolerating well. Pt with mild oral residuals with all POs, eventually spilling to the valleculae post intitial swallow (cues for second volitional swallows cleared). Piecemeal swallowing noted with solid PO, pt with good mastication and baseline adequate dentition. Pharyngeally, pt with incomplete epiglottic deflection, reduced laryngeal vestibule closure, and diminished sensation. This allowed for laryngeal penetration of thin liquids (PAS-2,3 via cup), (PAS 3, 8 thins via straw) post swallow silent tracheal aspiration of thins as penetrates progressed down trachea,  (PAS-2/3 nectar thick liquids via cup), and (PAS-2 puree during the swallow). Airway protection was intact with soft solid and honey thick liquids. Recommend conservative diet of honey thick liquids and dysphagia 3 (mechanical soft) consistencies with meds in puree (crush larger). Pt must utilize PMSV during all PO consumption. SLP to closely monitor for tolerance. Anticipate with extended time and PMSV usage, pt will be able to tolerate upgraded POs. SLP Visit Diagnosis Dysphagia, oropharyngeal phase (R13.12) Attention and concentration deficit following -- Frontal lobe and executive function deficit following -- Impact on safety and function Mild aspiration risk;Moderate aspiration risk   CHL IP TREATMENT RECOMMENDATION 03/22/2021 Treatment Recommendations Therapy as outlined in treatment plan below   Prognosis 03/22/2021 Prognosis for Safe Diet Advancement Good Barriers to Reach Goals Time post onset Barriers/Prognosis Comment -- CHL IP DIET RECOMMENDATION 03/22/2021 SLP Diet Recommendations Honey thick liquids;Dysphagia 3 (Mech soft) solids Liquid Administration via Cup Medication Administration Whole meds with puree Compensations  Slow rate;Small sips/bites Postural Changes Seated upright at 90 degrees;Remain semi-upright after after feeds/meals (Comment)   CHL IP OTHER RECOMMENDATIONS 03/22/2021 Recommended Consults -- Oral Care Recommendations Oral care BID Other Recommendations Order thickener from pharmacy;Place PMSV during PO intake   CHL IP FOLLOW UP RECOMMENDATIONS 03/22/2021 Follow up Recommendations Other (comment)   CHL IP FREQUENCY AND DURATION 03/22/2021 Speech Therapy Frequency (ACUTE ONLY) min 2x/week Treatment Duration 2 weeks      CHL IP ORAL PHASE 03/22/2021 Oral Phase Impaired Oral - Pudding Teaspoon -- Oral - Pudding Cup -- Oral - Honey Teaspoon -- Oral - Honey Cup Delayed oral transit;Lingual/palatal residue Oral - Nectar Teaspoon -- Oral - Nectar Cup Lingual/palatal residue;Delayed oral transit  Oral - Nectar Straw -- Oral - Thin Teaspoon -- Oral - Thin Cup Lingual/palatal residue;Delayed oral transit Oral - Thin Straw Lingual/palatal residue;Delayed oral transit Oral - Puree Delayed oral transit;Lingual/palatal residue Oral - Mech Soft Lingual/palatal residue;Decreased bolus cohesion;Piecemeal swallowing Oral - Regular -- Oral - Multi-Consistency -- Oral - Pill -- Oral Phase - Comment --  CHL IP PHARYNGEAL PHASE 03/22/2021 Pharyngeal Phase Impaired Pharyngeal- Pudding Teaspoon -- Pharyngeal -- Pharyngeal- Pudding Cup -- Pharyngeal -- Pharyngeal- Honey Teaspoon -- Pharyngeal -- Pharyngeal- Honey Cup Delayed swallow initiation-vallecula;Pharyngeal residue - valleculae Pharyngeal -- Pharyngeal- Nectar Teaspoon -- Pharyngeal -- Pharyngeal- Nectar Cup Delayed swallow initiation-pyriform sinuses;Reduced epiglottic inversion;Penetration/Aspiration during swallow;Pharyngeal residue - valleculae Pharyngeal Material enters airway, remains ABOVE vocal cords then ejected out;Material enters airway, remains ABOVE vocal cords and not ejected out Pharyngeal- Nectar Straw -- Pharyngeal -- Pharyngeal- Thin Teaspoon -- Pharyngeal -- Pharyngeal- Thin Cup Delayed swallow initiation-pyriform sinuses;Reduced epiglottic inversion;Penetration/Aspiration during swallow;Penetration/Apiration after swallow;Pharyngeal residue - valleculae Pharyngeal Material enters airway, remains ABOVE vocal cords then ejected out;Material enters airway, remains ABOVE vocal cords and not ejected out Pharyngeal- Thin Straw Delayed swallow initiation-pyriform sinuses;Reduced epiglottic inversion;Reduced airway/laryngeal closure;Penetration/Apiration after swallow;Penetration/Aspiration during swallow;Pharyngeal residue - valleculae Pharyngeal Material enters airway, remains ABOVE vocal cords and not ejected out;Material enters airway, passes BELOW cords without attempt by patient to eject out (silent aspiration) Pharyngeal- Puree Delayed swallow  initiation-vallecula;Penetration/Aspiration during swallow;Reduced epiglottic inversion;Pharyngeal residue - valleculae Pharyngeal Material enters airway, remains ABOVE vocal cords then ejected out;Material enters airway, remains ABOVE vocal cords and not ejected out Pharyngeal- Mechanical Soft Delayed swallow initiation-vallecula;Pharyngeal residue - valleculae Pharyngeal -- Pharyngeal- Regular -- Pharyngeal -- Pharyngeal- Multi-consistency -- Pharyngeal -- Pharyngeal- Pill -- Pharyngeal -- Pharyngeal Comment --  CHL IP CERVICAL ESOPHAGEAL PHASE 03/22/2021 Cervical Esophageal Phase WFL Pudding Teaspoon -- Pudding Cup -- Honey Teaspoon -- Honey Cup -- Nectar Teaspoon -- Nectar Cup -- Nectar Straw -- Thin Teaspoon -- Thin Cup -- Thin Straw -- Puree -- Mechanical Soft -- Regular -- Multi-consistency -- Pill -- Cervical Esophageal Comment -- Chelsea E Hartness MA, CCC-SLP 03/22/2021, 1:26 PM                 Assessment/Plan:  INTERVAL HISTORY: PJP PCR is negative   Principal Problem:   HIV infection (HCC) Active Problems:   AKI (acute kidney injury) (HCC)   Candida esophagitis (HCC)   Multifocal pneumonia   Acute respiratory failure with hypoxemia (HCC)   PCP (pneumocystis carinii pneumonia) (HCC)   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 male with HIV and  AIDS mitis it initially seen in the hospital when he was diagnosed and placed on Dovato and PCP prophylaxis. He was admitted now with respiratory failure and concern for ARDS.  HIs HIV had responded DRAMATICALLY to Dovato with VL coming down from 2.4 million to a few hundred copies  Been treated for pneumocystis empirically as well as for MSSA pneumonia.  Has had acute kidney injury and ATN from sepsis and was on CVVVHD and transferred to [facility] ICU for IHD. Per  nephrology, will hold on further HD to see if his kidney function continues to improve.   #1 Hypoxemic Respiratory failure:  PJP ag and PCR both negative. Thought that his respiratory status could be 2/2 IRIS. He also had MSSA grow from a tracheal aspirate and MRSE in a BAL. He is completing a course of Zyvox. His O2 requirement remains stable. Will continue zyvox and PCP treatment with steroids for now.   #2 HIV/AIDS: Responded dramatically to his treatment. He was previously on Dovato and there is currently not a need for [place]. Continue Dovato.   #3 Renal failure: Likely due to ATN 2/2 hypotension in the setting of sepsis. Renal function improving. Continue to monitor.   I spent more than 20 minutes with the patient including face to face counseling of the patient personally reviewing radiographs, along with pertinent laboratory microbiological, virological data review of medical records before and during the visit and in coordination of his care.   For now we will continue him on PCP treatment with antibiotics and steroids     INFECTIOUS DISEASE ATTENDING ADDENDUM:   This patient has been seen and discussed with the house staff. Please see the resident's note for complete details. I have reviewed the pertinent  laboratory and radiographic data including his chest x-ray CT exam blood cultures bronchoalveolar lavage cultures PCP antigen PCR testing HIV viral load CD4 count examined the patient independently.  I concur with their findings with the following additions/corrections:  Gaspar Bidding seems to be improving urine output is picking up and he currently does not need dialysis  His ventilator dependent respiratory failure seems improved as well he now has a trach collar with Passy-Muir valve.  We will continue to treat him for PCP despite his PCP DNA and antigen test being negative.  Part of the reasoning with regards to this is that he might have falsely negative test in the  context of effective treatment having been given prior to test having been taken.  Additionally we have the information in talking to my colleague Dr. Graylon Good that the patient worsened clinically after coming off of P CP's treatment.  Therefore we will continue clindamycin and primaquine to treat PCP along with prednisone to complete 21 days of therapy  I do wonder whether IRIS could have been playing a role in his acute respiratory failure.  Also treated for MSSA VAP and MRSE VAP  HIV is, under dramatic control with Dovato which is combinaton of Dolutegravir (T[place]ay) and Lamivudine (3TC)   He does not have need of a third drug (tenofovir alafenamide)  I spent more than 38 minutes with the patient including face to face counseling of the patient regarding his excellent adherence to his antiretroviral therapy how his immune system is remote responded dramatically to treatment with Dovato, the concept of IRIS, the reasons why we are continuing to treat for PCP his overall upward trajectory, personally reviewing chest x-ray, along with BAL and blood cultures, HIV viral load prior genotypic resistance  hepatitis status review of medical records before and during the visit and in coordination of his care.      LOS: 18 days   Althea Grimmer 03/22/2021, 3:58 PM

## 2021-03-22 NOTE — Progress Notes (Addendum)
NAME:  Bruce Hunter, MRN:  [id number], DOB:  [birth year], LOS: 38 ADMISSION DATE:  03/04/2021, CONSULTATION DATE:  6/30 REFERRING MD:  Melina Copa, CHIEF COMPLAINT:  Dyspnea   History of Present Illness:  45 y/o male with HIV/AIDS admitted with acute hypoxemic respiratory failure due to MSSA and presumed PJP pneumonia.   Prolonged ventilation requiring tracheostomy and AKI requiring CRRT  Pertinent  Medical History  Asthma DM2 Gout HTN Obesity GERD AKI Neuropathy  Significant Hospital Events: Including procedures, antibiotic start and stop dates in addition to other pertinent events   6/30 admitted to [facility] for PNA with known HIV/AIDS 7/1 ETT >>>7/13 Echo 7/1 mild  RA dilation with RV low nl fxn mild PAS elevation and nl LV 7/1 - renal consult, ID consult 7/2 HD catheter >>> 7/5, Fi O2 50%, Pneumocystis DFA negative 03/06/21 ([facility]), MSSA on LRCx. Superficial LUE clot 7/6 - ETT tube advancec by CRNA 7/7 - CRRT stopped 7/8 - Expanding antibiotics to include CNS coverage including acyclovir.  7/9 LP. Febrile to 101 Fahrenheit.  White count 17.5 thousand and slightly better.  1PRBC. CRRT restarted. LP -opening pressure 38 cmH2O, cervical pressure 32 cm, clear colorless CSF, 91 mg percent glucose 0 RBC, 1 WBC, protein 30 mg/dL 7/10 continues on CRRT, MV, pressors 7/11 plt downtrending. Cont on MV CRRT NE. Weaning sedation, trial of vent wean x2 7/12 getting 1 PRBC. BAL -- sending for cx, fungal, PCP 7/13 tracheostomy BAL neg for PCP ag  7/15 CRRT off 7/16 transferred to [facility] ICU 7/17 patient removed cortrak during PM d/t confusion 7/18 Hgb 6.5 -> received PRBC x1  Interim History / Subjective:  Patient removed cortrak overnight d/t confusion. Patient received D10 d/t discontinued feeding tube.  Patient's Hgb 6.5 this AM. Received 1 U PRBC.  Tolerating 5 L/min humidified O2 at 28% FiO2 and SpO2 100%.  Precedex decreased overnight.  Objective    Blood pressure (!) 137/94, pulse 89, temperature 98.9 F (37.2 C), temperature source Oral, resp. rate 18, height 5' 10.98" (1.803 m), weight 79.6 kg, SpO2 99 %.    FiO2 (%):  [28 %] 28 %   Intake/Output Summary (Last 24 hours) at 03/22/2021 0908 Last data filed at 03/22/2021 0800 Gross per 24 hour  Intake 1841.63 ml  Output 2625 ml  Net -783.37 ml    Filed Weights   03/18/21 0500 03/19/21 0500 03/22/21 0600  Weight: 93.8 kg 89.3 kg 79.6 kg   Physical Exam: General: Critically ill-appearing, no acute distress HENT: , AT, OP clear, MMM Neck: Trach in place, c/d/i Eyes: EOMI, no scleral icterus Respiratory: "gravely" breath sounds L>R Cardiovascular: RRR, -M/R/G, no JVD GI: BS+, soft, nontender Extremities:-Edema,-tenderness Neuro:  Awake, alert, CNII-XII grossly intact, moves extremities x 4 GU: Foley in place  Chest x-ray 7/15 independently reviewed, bilateral interstitial and alveolar opacities especially in the hilar regions, mild improvement in right suprahilar airspace disease  Chest x-ray 7/17 independently reviewed, worse opacities than 2 days ago, diffuse on L and upper/mid on R especially in hilar region.  Labs show leukocytosis (decreased 18.1>12 on 7/18), stable anemia, resolved thrombocytopenia, creatinine at or near baseline.  Sputum DFA 7/2 Neg Respiratory culture 7/2 S. aureus BAL from 7/12 > neg DFA and PCR for PCP CSF cultures fungal, viral NTD  Respiratory culture 7/12 S. Epidermidis sensitive to [facility] and [facility] Problem list    Severe sepsis due to multifocal pneumonia  Assessment & Plan:  Acute metabolic  encephalopathy: Had very high sedation needs earlier in the admission Chronic pain Anxiety Substance abuse disorder Clonidine taper to facilitate weaning of Precedex. RASS target 0 to +1. Plan to gradually taper off clonazepam and oxycodone.  -clonidine 0.2 mg TID -clonazepam (Klonopin) 0.5 mg BID -oxycodone 5 mg  TID -fentanyl 25-100 mcg q2h PRN -midazolam (Versed) 1-2 mg q2h PRN -D/C dexmedetomidine (Precedex)  Acute respiratory failure with hypoxemia due to MSSA pneumonia: improving, completed course of cefazolin for MSSA x 14 ds, MRSE on BAL 7/12 PJP ag and PCR neg S/p tracheostomy with minimal oxygen requirements  VAP prevention. Speech consulted for PMV. ID following.  -linezolid 600 mg BID (started 7/14) -clindamycin 900 mg q8h (started 7/8) -primaquine 30 mg qd -prednisone 20 mg qd -ipratropium-albuterol DUONEB TID -Albuterol 2.5 mg PRN  HIV/AIDS -CD4 has improved to 325 (7/12) from <35 (6/13); and viral load went down to 240 (7/12) from 2.7 million (6/10). Continue antiretroviral Dovato.  -dolutegravir 50 mg qd -lamivudine 300 mg qd  Hypertension resumed amlodipine and clonidine added 7/15, titrate to effect.  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 Nephrology following iHD per Nephrology Trend BMET  Anemia Thrombocytopenia- Platelets stabalizing between 150,000-200,000 Monitor for bleeding Transfuse PRBC for Hgb < 7 gm/dL Darbepoetin alfa (aranesp) injection weekly (Sunday)  Hyperglycemia, related to steroids -Lantus 10 units while on steroids, monitor CBG -Novolog SS  Severe deconditioning -PT/OT ongoing, plan for SNF eventually when liberated  Best Practice (right click and "Reselect all SmartList Selections" daily)   Diet/type: tubefeeds DVT prophylaxis: prophylactic heparin  GI prophylaxis: Protonix 40 mg qd Lines: Dialysis Catheter RIJ Foley:  Urethral catheter (7/16) Code Status:  full code Last date of multidisciplinary goals of care discussion [7/12: family desires full scope of practice, tracheostomy]  Critical care time:  31 minutes   ------------------------------------------------------------------------------------------------------------  Attending note: I have seen and examined the patient. History, labs and imaging reviewed.  45 Y/O with new diagnosis of HIV/AIDS admitted with acute resp failure secondary to pneumonia, AKI requiring CRRT, s/p trach Getting 1 unit PRBC for low Hb, off precedex On trach collar for 3 days  Blood pressure (!) 135/93, pulse 100, temperature 99 F (37.2 C), temperature source Oral, resp. rate (!) 24, height 5' 10.98" (1.803 m), weight 79.6 kg, SpO2 100 %. Gen:      No acute distress HEENT:  EOMI, sclera anicteric Neck:     No masses; no thyromegaly, trach Lungs:    Clear to auscultation bilaterally; normal respiratory effort CV:         Regular rate and rhythm; no murmurs Abd:      + bowel sounds; soft, non-tender; no palpable masses, no distension Ext:    No edema; adequate peripheral perfusion Skin:      Warm and dry; no rash Neuro: Awake, responsive  Labs/Imaging personally reviewed, significant for BUN/creatinine 113/2.56, WBC 12, hemoglobin 6.5, platelets 200 No new imaging  Assessment/plan: ARDS, Acute respiratory failure On Linezolid Will disucss with ID regarding duration of empiric PJP treatment   Acute metabolic encephalopathy Off precedex Increase clonidine dose Continue klonopin Increase oxy as he is getting frequent pushes of fentanyl  AKI Off CRRT Monitor for recovery Continue foley for urinary retention  Low Hb No evidence of active  bleed Follow CBC post transfusion  The patient is critically ill with multiple organ systems failure and requires high complexity decision making for assessment and support, frequent evaluation and titration of therapies, application of advanced monitoring technologies and extensive interpretation of multiple databases.  Critical care time - 35 mins. This represents my time independent of the NPs time taking  care of the pt.  Marshell Garfinkel MD [facility] 03/22/2021, 10:10 AM

## 2021-03-23 DIAGNOSIS — J9602 Acute respiratory failure with hypercapnia: Secondary | ICD-10-CM

## 2021-03-23 DIAGNOSIS — R410 Disorientation, unspecified: Secondary | ICD-10-CM

## 2021-03-23 DIAGNOSIS — J9601 Acute respiratory failure with hypoxia: Secondary | ICD-10-CM | POA: Diagnosis not present

## 2021-03-23 DIAGNOSIS — R4182 Altered mental status, unspecified: Secondary | ICD-10-CM

## 2021-03-23 DIAGNOSIS — G9341 Metabolic encephalopathy: Secondary | ICD-10-CM | POA: Diagnosis not present

## 2021-03-23 DIAGNOSIS — Z930 Tracheostomy status: Secondary | ICD-10-CM

## 2021-03-23 DIAGNOSIS — A419 Sepsis, unspecified organism: Secondary | ICD-10-CM | POA: Diagnosis not present

## 2021-03-23 DIAGNOSIS — J95851 Ventilator associated pneumonia: Secondary | ICD-10-CM

## 2021-03-23 LAB — GLUCOSE, CAPILLARY
Glucose-Capillary: 99 mg/dL (ref 70–99)
Glucose-Capillary: 144 mg/dL — ABNORMAL HIGH (ref 70–99)
Glucose-Capillary: 228 mg/dL — ABNORMAL HIGH (ref 70–99)
Glucose-Capillary: 157 mg/dL — ABNORMAL HIGH (ref 70–99)
Glucose-Capillary: 102 mg/dL — ABNORMAL HIGH (ref 70–99)

## 2021-03-23 LAB — BASIC METABOLIC PANEL
Anion gap: 11 (ref 5–15)
BUN: 102 mg/dL — ABNORMAL HIGH (ref 6–20)
CO2: 23 mmol/L (ref 22–32)
Calcium: 9.1 mg/dL (ref 8.9–10.3)
Chloride: 114 mmol/L — ABNORMAL HIGH (ref 98–111)
Creatinine, Ser: 2.25 mg/dL — ABNORMAL HIGH (ref 0.61–1.24)
GFR, Estimated: 36 mL/min — ABNORMAL LOW (ref >60)
Glucose, Bld: 120 mg/dL — ABNORMAL HIGH (ref 70–99)
Potassium: 4.1 mmol/L (ref 3.5–5.1)
Sodium: 148 mmol/L — ABNORMAL HIGH (ref 135–145)

## 2021-03-23 LAB — CBC
HCT: 25.2 % — ABNORMAL LOW (ref 39.0–52.0)
Hemoglobin: 7.2 g/dL — ABNORMAL LOW (ref 13.0–17.0)
MCH: 27.9 pg (ref 26.0–34.0)
MCHC: 28.6 g/dL — ABNORMAL LOW (ref 30.0–36.0)
MCV: 97.7 fL (ref 80.0–100.0)
Platelets: 228 10*3/uL (ref 150–400)
RBC: 2.58 MIL/uL — ABNORMAL LOW (ref 4.22–5.81)
RDW: 21.3 % — ABNORMAL HIGH (ref 11.5–15.5)
WBC: 8.4 10*3/uL (ref 4.0–10.5)
nRBC: 0 % (ref 0.0–0.2)

## 2021-03-23 LAB — VIRUS CULTURE

## 2021-03-23 LAB — BPAM RBC
Blood Product Expiration Date: 202208142359
ISSUE DATE / TIME: 202207180914
Unit Type and Rh: 5100

## 2021-03-23 LAB — FERRITIN: Ferritin: 877 ng/mL — ABNORMAL HIGH (ref 24–336)

## 2021-03-23 LAB — TYPE AND SCREEN
ABO/RH(D): O POS
Antibody Screen: NEGATIVE
Unit division: 0

## 2021-03-23 LAB — IRON AND TIBC
Iron: 18 ug/dL — ABNORMAL LOW (ref 45–182)
Saturation Ratios: 9 % — ABNORMAL LOW (ref 17.9–39.5)
TIBC: 196 ug/dL — ABNORMAL LOW (ref 250–450)
UIBC: 178 ug/dL

## 2021-03-23 MED ORDER — INSULIN ASPART 100 UNIT/ML IJ SOLN: 0.0000 [IU] | Freq: Every day | INTRAMUSCULAR | Status: DC

## 2021-03-23 MED ORDER — DOCUSATE SODIUM 100 MG PO CAPS: 100.0000 mg | ORAL_CAPSULE | Freq: Two times a day (BID) | ORAL | Status: DC | PRN

## 2021-03-23 MED ORDER — DOLUTEGRAVIR SODIUM 50 MG PO TABS
50.0000 mg | ORAL_TABLET | Freq: Every day | ORAL | Status: DC
  Administered 2021-03-23 – 2021-03-30 (×8): 50 mg via ORAL
  Filled 2021-03-23 (×7): qty 1

## 2021-03-23 MED ORDER — SODIUM CHLORIDE 0.9 % IV SOLN
250.0000 mg | Freq: Every day | INTRAVENOUS | Status: AC
  Administered 2021-03-23 – 2021-03-26 (×4): 250 mg via INTRAVENOUS
  Filled 2021-03-23 (×4): qty 20

## 2021-03-23 MED ORDER — AMLODIPINE BESYLATE 10 MG PO TABS
10.0000 mg | ORAL_TABLET | Freq: Every day | ORAL | Status: DC
  Administered 2021-03-23 – 2021-03-30 (×8): 10 mg via ORAL
  Filled 2021-03-23 (×9): qty 1

## 2021-03-23 MED ORDER — CLONIDINE HCL 0.1 MG PO TABS
0.1000 mg | ORAL_TABLET | Freq: Two times a day (BID) | ORAL | Status: AC
  Administered 2021-03-24 (×2): 0.1 mg via ORAL
  Filled 2021-03-23 (×2): qty 1

## 2021-03-23 MED ORDER — CLONIDINE HCL 0.1 MG PO TABS
0.1000 mg | ORAL_TABLET | Freq: Three times a day (TID) | ORAL | Status: AC
  Administered 2021-03-23 (×2): 0.1 mg via ORAL
  Filled 2021-03-23 (×2): qty 1

## 2021-03-23 MED ORDER — DEXTROSE 5 % IV SOLN: INTRAVENOUS | Status: DC

## 2021-03-23 MED ORDER — INSULIN ASPART 100 UNIT/ML IJ SOLN
0.0000 [IU] | Freq: Three times a day (TID) | INTRAMUSCULAR | Status: DC
  Administered 2021-03-23: 5 [IU] via SUBCUTANEOUS
  Administered 2021-03-23 – 2021-03-25 (×5): 3 [IU] via SUBCUTANEOUS
  Administered 2021-03-26 – 2021-03-27 (×3): 2 [IU] via SUBCUTANEOUS
  Administered 2021-03-27: 3 [IU] via SUBCUTANEOUS
  Administered 2021-03-27: 2 [IU] via SUBCUTANEOUS
  Administered 2021-03-28: 3 [IU] via SUBCUTANEOUS
  Administered 2021-03-28: 2 [IU] via SUBCUTANEOUS
  Administered 2021-03-29 – 2021-03-30 (×3): 3 [IU] via SUBCUTANEOUS

## 2021-03-23 MED ORDER — POLYETHYLENE GLYCOL 3350 17 G PO PACK: 17.0000 g | PACK | Freq: Every day | ORAL | Status: DC | PRN

## 2021-03-23 MED ORDER — LINEZOLID 600 MG PO TABS
600.0000 mg | ORAL_TABLET | Freq: Two times a day (BID) | ORAL | Status: AC
  Administered 2021-03-23 – 2021-03-24 (×4): 600 mg via ORAL
  Filled 2021-03-23 (×4): qty 1

## 2021-03-23 MED ORDER — ORAL CARE MOUTH RINSE
15.0000 mL | Freq: Two times a day (BID) | OROMUCOSAL | Status: DC
  Administered 2021-03-23 – 2021-03-29 (×13): 15 mL via OROMUCOSAL

## 2021-03-23 MED ORDER — CLONAZEPAM 0.5 MG PO TABS
0.5000 mg | ORAL_TABLET | Freq: Two times a day (BID) | ORAL | Status: DC
  Administered 2021-03-23 – 2021-03-30 (×15): 0.5 mg via ORAL
  Filled 2021-03-23 (×15): qty 1

## 2021-03-23 MED ORDER — CLONIDINE HCL 0.1 MG PO TABS
0.1000 mg | ORAL_TABLET | Freq: Every day | ORAL | Status: AC
  Administered 2021-03-25: 0.1 mg via ORAL
  Filled 2021-03-23: qty 1

## 2021-03-23 MED ORDER — ACETAMINOPHEN 325 MG PO TABS
650.0000 mg | ORAL_TABLET | Freq: Four times a day (QID) | ORAL | Status: DC | PRN
  Administered 2021-03-28 – 2021-03-30 (×8): 650 mg via ORAL
  Filled 2021-03-23 (×8): qty 2

## 2021-03-23 MED ORDER — PRIMAQUINE PHOSPHATE 26.3 (15 BASE) MG PO TABS
30.0000 mg | ORAL_TABLET | Freq: Every day | ORAL | Status: DC
  Administered 2021-03-23 – 2021-03-30 (×8): 30 mg via ORAL
  Filled 2021-03-23 (×8): qty 2

## 2021-03-23 MED ORDER — FREE WATER: 200.0000 mL | Status: DC

## 2021-03-23 MED ORDER — TAMSULOSIN HCL 0.4 MG PO CAPS
0.4000 mg | ORAL_CAPSULE | Freq: Every day | ORAL | Status: AC
  Administered 2021-03-23 – 2021-03-25 (×3): 0.4 mg via ORAL
  Filled 2021-03-23 (×3): qty 1

## 2021-03-23 NOTE — TOC Progression Note (Signed)
Transition of Care [facility]) - Progression Note    Patient Details  Name: Bruce Hunter MRN: [id number] Date of Birth: Jan 18, 1976  Transition of Care [facility]) CM/SW Contact  Sharin Mons, RN Phone Number: 03/23/2021, 7:57 AM  Clinical Narrative:    Consult received for LTAC. Pt's payor source is Medicaid. LTAC's doesn't accept medicaid.  TOC team will continue monitor and assist with TOC needs.  Expected Discharge Plan: Home/Self Care Barriers to Discharge: Continued Medical Work up  Expected Discharge Plan and Services Expected Discharge Plan: Home/Self Care   Discharge Planning Services: CM Consult   Living arrangements for the past 2 months: Single Family Home                                       Social Determinants of Health (SDOH) Interventions    Readmission Risk Interventions No flowsheet data found.

## 2021-03-23 NOTE — Progress Notes (Addendum)
   03/23/21 1024  Clinical Encounter Type  Visited With Patient not available  Visit Type Initial  Referral From Social work   The chaplain responded to [employer] stating the patient's mother wants POA. There was no support person present. The Advance Directive cannot be done at this time because the patient is intubated and unable to state who he wants to appoint as his HCPOA.  This note was prepared by Jeanine Luz, M.Div..  For questions please contact by phone [phone].

## 2021-03-23 NOTE — Progress Notes (Signed)
Physical Therapy Treatment Patient Details Name: Bruce Hunter MRN: [id number] DOB: 06-Mar-1976 Today's Date: 03/23/2021    History of Present Illness Bruce Hunter is a/an 45 y.o. male with a past medical history significant for HIV/AIDS, CKD, asthma, obesity, DM2 admitted for acute hypoxic respiratory failure with sepsis, requiring ventilator support, CRRT, S/P tracheostomy 03/17/21.    PT Comments    Pt admitted with above diagnosis. Pt able to sit EOB x 30 min with min guard assist once he was sitting with feet supported. Stretches of trunk and some rotation as well as LE exercises performed by pt. Pt tolerating much more therapy today than evaluation.  Pt currently with functional limitations due to balance and endurance deficits. Pt will benefit from skilled PT to increase their independence and safety with mobility to allow discharge to the venue listed below.      Follow Up Recommendations  LTACH;SNF     Equipment Recommendations  Wheelchair cushion (measurements PT);Wheelchair (measurements PT)    Recommendations for Other Services       Precautions / Restrictions Precautions Precautions: Fall Precaution Comments: trach with PSMV, flexiseal, foley Restrictions Weight Bearing Restrictions: No    Mobility  Bed Mobility Overal bed mobility: Needs Assistance Bed Mobility: Supine to Sit;Sit to Supine     Supine to sit: +2 for physical assistance;Max assist Sit to supine: +2 for physical assistance;Max assist   General bed mobility comments: multimodal cues for technique, assist for all aspects, use of bed pad to square up hips at EOB    Transfers                    Ambulation/Gait                 Stairs             Wheelchair Mobility    Modified Rankin (Stroke Patients Only)       Balance Overall balance assessment: Needs assistance Sitting-balance support: Feet supported Sitting balance-Leahy Scale: Fair Sitting balance -  Comments: initially poor, progressed to fair                                    Cognition Arousal/Alertness: Awake/alert Behavior During Therapy: Flat affect Overall Cognitive Status: Impaired/Different from baseline Area of Impairment: Following commands;Memory;Safety/judgement;Awareness;Problem solving;Attention                   Current Attention Level: Sustained Memory: Decreased recall of precautions;Decreased short-term memory Following Commands: Follows one step commands with increased time;Follows one step commands inconsistently Safety/Judgement: Decreased awareness of safety;Decreased awareness of deficits   Problem Solving: Slow processing;Decreased initiation;Difficulty sequencing;Requires verbal cues;Requires tactile cues General Comments: pt talking with [facility]      Exercises General Exercises - Lower Extremity Long Arc Quad: AROM;Both;5 reps;Seated    General Comments General comments (skin integrity, edema, etc.): Pt on 28% trach collar with VSS      Pertinent Vitals/Pain Pain Assessment: Faces Faces Pain Scale: Hurts even more Pain Location: L shoulder and back Pain Descriptors / Indicators: Discomfort;Sore Pain Intervention(s): Limited activity within patient's tolerance;Monitored during session;Repositioned    Home Living                      Prior Function            PT Goals (current goals can now be found in the care plan  section) Acute Rehab PT Goals Patient Stated Goal: to get stronger Progress towards PT goals: Progressing toward goals    Frequency    Min 2X/week      PT Plan Current plan remains appropriate    Co-evaluation PT/OT/SLP Co-Evaluation/Treatment: Yes Reason for Co-Treatment: Complexity of the patient's impairments (multi-system involvement);For patient/therapist safety PT goals addressed during session: Mobility/safety with mobility OT goals addressed during session: ADL's and  self-care;Strengthening/ROM      AM-PAC PT "6 Clicks" Mobility   Outcome Measure  Help needed turning from your back to your side while in a flat bed without using bedrails?: A Lot Help needed moving from lying on your back to sitting on the side of a flat bed without using bedrails?: A Lot Help needed moving to and from a bed to a chair (including a wheelchair)?: Total Help needed standing up from a chair using your arms (e.g., wheelchair or bedside chair)?: Total Help needed to walk in hospital room?: Total Help needed climbing 3-5 steps with a railing? : Total 6 Click Score: 8    End of Session Equipment Utilized During Treatment: Gait belt;Oxygen (trach) Activity Tolerance: Patient tolerated treatment well Patient left: in bed;with call bell/phone within reach;with bed alarm set Nurse Communication: Mobility status;Need for lift equipment PT Visit Diagnosis: Muscle weakness (generalized) (M62.81)     Time: [id number] PT Time Calculation (min) (ACUTE ONLY): 39 min  Charges:  $Therapeutic Exercise: 8-22 mins $Therapeutic Activity: 8-22 mins                     Bruce Hunter M,PT Acute Rehab Services [phone] (pager)    Bruce Hunter 03/23/2021, 1:51 PM

## 2021-03-23 NOTE — Progress Notes (Signed)
NAME:  Bruce Hunter, MRN:  [id number], DOB:  1976-09-01, LOS: 73 ADMISSION DATE:  03/04/2021, CONSULTATION DATE:  6/30 REFERRING MD:  Melina Copa, CHIEF COMPLAINT:  Dyspnea   History of Present Illness:  45 y/o male with HIV/AIDS admitted with acute hypoxemic respiratory failure due to MSSA and presumed PJP pneumonia.   Prolonged ventilation requiring tracheostomy and AKI requiring CRRT  Pertinent  Medical History  Asthma DM2 Gout HTN Obesity GERD AKI Neuropathy  Significant Hospital Events: Including procedures, antibiotic start and stop dates in addition to other pertinent events   6/30 admitted to [facility] for PNA with known HIV/AIDS 7/1 ETT >>>7/13 Echo 7/1 mild  RA dilation with RV low nl fxn mild PAS elevation and nl LV 7/1 - renal consult, ID consult 7/2 HD catheter >>> 7/5, Fi O2 50%, Pneumocystis DFA negative 03/06/21 ([facility]), MSSA on LRCx. Superficial LUE clot 7/6 - ETT tube advancec by CRNA 7/7 - CRRT stopped 7/8 - Expanding antibiotics to include CNS coverage including acyclovir.  7/9 LP. Febrile to 101 Fahrenheit.  White count 17.5 thousand and slightly better.  1PRBC. CRRT restarted. LP -opening pressure 38 cmH2O, cervical pressure 32 cm, clear colorless CSF, 91 mg percent glucose 0 RBC, 1 WBC, protein 30 mg/dL 7/10 continues on CRRT, MV, pressors 7/11 plt downtrending. Cont on MV CRRT NE. Weaning sedation, trial of vent wean x2 7/12 getting 1 PRBC. BAL -- sending for cx, fungal, PCP 7/13 tracheostomy BAL neg for PCP ag  7/15 CRRT off 7/16 transferred to [facility] ICU 7/17 patient removed cortrak during PM d/t confusion 7/18 Hgb 6.5 -> received PRBC x1  Interim History / Subjective:   No acute events overnight. Patient had modified barium swallow study (7/19) demonstrating ability to swallow honey-thick liquids. Did not need replacement of Cortrak.  Patient now has Passy Damita Lack Speaking Valve (PMSV).  Patient's appetite is fair, states  he enjoyed eating watermelon.  Endorses a lot of chronic pain in his back. States it is 11/10 pain.  Objective   Blood pressure (!) 153/97, pulse 99, temperature 98.1 F (36.7 C), temperature source Oral, resp. rate (!) 23, height 5' 10.98" (1.803 m), weight 79.6 kg, SpO2 100 %.    Vent Mode: Stand-by FiO2 (%):  [28 %-100 %] 28 %   Intake/Output Summary (Last 24 hours) at 03/23/2021 1021 Last data filed at 03/23/2021 0900 Gross per 24 hour  Intake 1825.83 ml  Output 2555 ml  Net -729.17 ml    Filed Weights   03/18/21 0500 03/19/21 0500 03/22/21 0600  Weight: 93.8 kg 89.3 kg 79.6 kg   Physical Exam: General: Appears uncomfortable, no acute distress HENT:  OP clear, MMM Neck: Trach in place Eyes: EOMI, no scleral icterus Respiratory: "gravely" breath sounds L>R, inspiratory stridor on R>L Cardiovascular: RRR, -M/R/G, no JVD GI: BS+, soft, nontender Extremities:-Edema,-tenderness Neuro:  Awake, alert, CNII-XII grossly intact, moves extremities x 4 GU: Foley in place  Chest x-ray 7/15 independently reviewed, bilateral interstitial and alveolar opacities especially in the hilar regions, mild improvement in right suprahilar airspace disease  Chest x-ray 7/17 independently reviewed, worse opacities than 2 days ago, diffuse on L and upper/mid on R especially in hilar region.  Labs show leukocytosis (decreased 18.1>12 on 7/18), stable anemia, resolved thrombocytopenia, creatinine at or near baseline.  Sputum DFA 7/2 Neg Respiratory culture 7/2 S. aureus BAL from 7/12 > neg DFA and PCR for PCP CSF cultures fungal, viral NTD  Respiratory culture 7/12 S. Epidermidis  sensitive to [facility] and [facility] Problem list   Acute metabolic encephalopathy Severe sepsis due to multifocal pneumonia   Assessment & Plan:  Acute respiratory failure with hypoxemia due to MSSA pneumonia/PJP?: Overall improving; s/p tracheostomy with minimal oxygen requirements and Passy Muir  speaking valve (PMSV). Completed course of cefazolin for MSSA x 14 ds, MRSE on BAL 7/12. PJP ag and PCR neg. per ID note (7/18) suspect false negative for PJP given test done after starting antibiotics and patient worsened after discontinuing PJP treatment at [facility]. Continue treatment for presumed PJP for 21 days. 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 -ID following  -Speech consulted for PMV -VAP prevention -linezolid (Zyvox) 600 mg BID (started 7/14) -clindamycin 900 mg q8h (started 7/8) -primaquine 30 mg qd (started 7/8) -prednisone 20 mg qd (started 7/16) -ipratropium-albuterol (DUONEB) TID -albuterol 2.5 mg PRN  HIV/AIDS: CD4 has improved to 325 (7/12) from <35 (6/13); and viral load went down to 240 (7/12) from 2.7 million (6/10). Plan to continue antiretroviral Dovato. 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 HD catheter placed 7/2. Patient has history of CKD with baseline Creatine around 2.5 per chart notes. Patient has good urine output, but had urinary retention requiring foley catheter placement on 7/16. Patient currently has no indication of continued need for HD catheter. Will consider removing HD and foley catheter 7/19. -Nephrology following -Trend BMP  Chronic pain/Substance abuse disorder/Anxiety: Patient has history of chronic opioid use for chronic back pain. -Weaned off Precedex and starting Clonidine taper.  -RASS target 0 to +1. -Plan to gradually taper off clonazepam and oxycodone. -clonidine 0.1 mg TID -clonazepam (Klonopin) 0.5 mg BID -oxycodone 5 mg TID -fentanyl 25-100 mcg q2h PRN -midazolam (Versed) 1-2 mg q2h PRN  Hypertension: Resumed amlodipine and clonidine added 7/15, titrate to effect. -amlodipine 10 mg qd -clonidine 0.1 mg TID as above  Hypernatremia/hyperchloremia: slight elevation in  sodium and chloride levels 7/19. Was receiving NS maintenance fluids, discontinued 7/19. -D5 continuous fluids per nephrology  Normocytic Anemia/Thrombocytopenia: Platelets stabalizing between 150,000-200,000. Iron studies showed elevated Ferratin and low TIBC and Tsat, more consistent with anemia of chronic disease in setting of his HIV and pneumonia. -Transfuse PRBC for Hgb < 7 gm/dL -darbepoetin alfa (aranesp) injection weekly (Sunday)  Hyperglycemia, related to steroids -Lantus 5 units while on steroids, monitor CBG -Novolog SS  Peripheral Neuropathy: -gabapentin 100 mg q12h  Severe deconditioning -PT/OT ongoing, plan for SNF eventually when liberated  Best Practice (right click and "Reselect all SmartList Selections" daily)   Diet/type: tubefeeds DVT prophylaxis: prophylactic heparin 5000 U GI prophylaxis: Protonix 40 mg qd Lines: Dialysis Catheter RIJ Foley:  Urethral catheter (7/16) Code Status:  full code Last date of multidisciplinary goals of care discussion [7/12: family desires full scope of practice, tracheostomy]  Kemper Durie, M.S. Medical Student [facility]  Critical care time: 31 minutes   ------------------------------------------------------------------------------------------------------------  Attending note: I have seen and examined the patient. History, labs and imaging reviewed.  45 Y/O with new diagnosis of HIV/AIDS admitted with acute resp failure secondary to pneumonia, AKI requiring CRRT, s/p trach Getting 1 unit PRBC for low Hb, off precedex On trach collar for 3 days  Blood pressure (!) 135/93, pulse 100, temperature 99 F (37.2 C), temperature source Oral, resp. rate (!) 24, height 5' 10.98" (1.803 m), weight 79.6 kg,  SpO2 100 %. Gen:      No acute distress HEENT:  EOMI, sclera anicteric Neck:     No masses; no thyromegaly, trach Lungs:    Clear to auscultation bilaterally; normal respiratory effort CV:         Regular  rate and rhythm; no murmurs Abd:      + bowel sounds; soft, non-tender; no palpable masses, no distension Ext:    No edema; adequate peripheral perfusion Skin:      Warm and dry; no rash Neuro: Awake, responsive  Labs/Imaging personally reviewed, significant for BUN/creatinine 113/2.56, WBC 12, hemoglobin 6.5, platelets 200 No new imaging  Assessment/plan: ARDS, Acute respiratory failure On Linezolid Will disucss with ID regarding duration of empiric PJP treatment   Acute metabolic encephalopathy Off precedex Increase clonidine dose Continue klonopin Increase oxy as he is getting frequent pushes of fentanyl  AKI Off CRRT Monitor for recovery Continue foley for urinary retention  Low Hb No evidence of active bleed Follow CBC post transfusion  The patient is critically ill with multiple organ systems failure and requires high complexity decision making for assessment and support, frequent evaluation and titration of therapies, application of advanced monitoring technologies and extensive interpretation of multiple databases.  Critical care time - 35 mins. This represents my time independent of the NPs time taking care of the pt.  Marshell Garfinkel MD [facility] 03/22/2021, 10:10 AM

## 2021-03-23 NOTE — Progress Notes (Signed)
Attending note: I have seen and examined the patient. History, labs and imaging reviewed.  45 Y/O with new diagnosis of HIV/AIDS admitted with acute resp failure secondary to pneumonia, AKI requiring CRRT, s/p trach On trach collar for about 4 days, tolerating trach and Passy-Muir valve.  Has remained off Precedex.  Blood pressure (!) 143/97, pulse 90, temperature 98.1 F (36.7 C), temperature source Oral, resp. rate (!) 23, height 5' 10.98" (1.803 m), weight 79.6 kg, SpO2 100 %. Gen:      No acute distress HEENT:  EOMI, sclera anicteric Neck:     No masses; no thyromegaly, trach collar Lungs:    Clear to auscultation bilaterally; normal respiratory effort CV:         Regular rate and rhythm; no murmurs Abd:      + bowel sounds; soft, non-tender; no palpable masses, no distension Ext:    No edema; adequate peripheral perfusion Skin:      Warm and dry; no rash Neuro: alert and oriented x 3  Labs/Imaging personally reviewed, significant for Sodium 148, BUN/creatinine 22/2.25 WBC 8.4, hemoglobin 7.2, platelets 228 No new imaging  Assessment/plan: ARDS, Acute respiratory failure On Linezolid Will need total 21 days of empiric pneumocystis treatment with clindamycin and primaquine per ID   Acute metabolic encephalopathy Continue clonidine taper, Klonopin Oxycodone for pain  AKI Of dialysis and making good urine output   Low Hb No evidence of active bleed Follow CBC and transfuse as needed  Hypernatremia D5 free water  Remove HD cath Remove foley's cath. Flomax ordered for retention.  Stable for transfer out of ICU and to hospitalist service. PCCM will follow for trach care  Marshell Garfinkel MD [facility] 03/23/2021, 8:36 AM

## 2021-03-23 NOTE — TOC Progression Note (Signed)
Transition of Care [facility]) - Progression Note    Patient Details  Name: Bruce Hunter MRN: [id number] Date of Birth: 1976/07/03  Transition of Care [facility]) CM/SW Contact  Milinda Antis, [place] Phone Number: 03/23/2021, 10:29 AM  Clinical Narrative:    10:29-  CSW spoke with Chaplain and was informed that the patient had to be able to give verbal consent for POA.  Chaplain will reach out to the patient's mother.     Expected Discharge Plan: Home/Self Care Barriers to Discharge: Continued Medical Work up  Expected Discharge Plan and Services Expected Discharge Plan: Home/Self Care   Discharge Planning Services: CM Consult   Living arrangements for the past 2 months: Single Family Home                                       Social Determinants of Health (SDOH) Interventions    Readmission Risk Interventions No flowsheet data found.

## 2021-03-23 NOTE — Progress Notes (Signed)
Occupational Therapy Treatment Patient Details Name: Bruce Hunter MRN: [id number] DOB: [birth year] Today's Date: 03/23/2021    History of present illness Bruce Hunter is a/an 45 y.o. male with a past medical history significant for HIV/AIDS, CKD, asthma, obesity, DM2 admitted for acute hypoxic respiratory failure with sepsis, requiring ventilator support, CRRT, S/P tracheostomy 03/17/21.   OT comments  Pt sat at EOB x 30 minutes, progressing to fair sitting balance despite report of back pain. VSS on 5L 28% Fi02. Pt completed toothbrushing with min assist in preparation for ice chips and sips of water which he was able to self feed with supervision. Pt cooperative and eager to get stronger so he can return home.   Follow Up Recommendations  SNF    Equipment Recommendations  Other (comment) (defer to next venue)    Recommendations for Other Services      Precautions / Restrictions Precautions Precautions: Fall Precaution Comments: trach with PSMV, flexiseal, foley Restrictions Weight Bearing Restrictions: No       Mobility Bed Mobility Overal bed mobility: Needs Assistance Bed Mobility: Supine to Sit;Sit to Supine     Supine to sit: +2 for physical assistance;Max assist Sit to supine: +2 for physical assistance;Max assist   General bed mobility comments: multimodal cues for technique, assist for all aspects, use of bed pad to square up hips at EOB    Transfers                      Balance Overall balance assessment: Needs assistance Sitting-balance support: Feet supported Sitting balance-Leahy Scale: Fair Sitting balance - Comments: initially poor, progressed to fair                                   ADL either performed or assessed with clinical judgement   ADL Overall ADL's : Needs assistance/impaired Eating/Feeding: Supervision/ safety;Sitting Eating/Feeding Details (indicate cue type and reason): to drink without straw Grooming: Oral  care;Sitting;Minimal assistance Grooming Details (indicate cue type and reason): brushed teeth                                     Vision       Perception     Praxis      Cognition Arousal/Alertness: Awake/alert Behavior During Therapy: Flat affect Overall Cognitive Status: Impaired/Different from baseline Area of Impairment: Following commands;Memory;Safety/judgement;Awareness;Problem solving;Attention                   Current Attention Level: Sustained Memory: Decreased recall of precautions;Decreased short-term memory Following Commands: Follows one step commands with increased time;Follows one step commands inconsistently Safety/Judgement: Decreased awareness of safety;Decreased awareness of deficits   Problem Solving: Slow processing;Decreased initiation;Difficulty sequencing;Requires verbal cues;Requires tactile cues General Comments: pt talking with PSMV        Exercises     Shoulder Instructions       General Comments      Pertinent Vitals/ Pain       Pain Assessment: Faces Pain Score:  (states 10 in back, nursing administered medicines) Faces Pain Scale: Hurts even more Pain Location: L shoulder and back Pain Descriptors / Indicators: Discomfort;Sore Pain Intervention(s): Monitored during session;Repositioned  Home Living  Prior Functioning/Environment              Frequency  Min 2X/week        Progress Toward Goals  OT Goals(current goals can now be found in the care plan section)  Progress towards OT goals: Progressing toward goals  Acute Rehab OT Goals Patient Stated Goal: to get stronger OT Goal Formulation: With family Time For Goal Achievement: 04/01/21 Potential to Achieve Goals: Good  Plan Discharge plan remains appropriate    Co-evaluation    PT/OT/SLP Co-Evaluation/Treatment: Yes Reason for Co-Treatment: Complexity of the patient's  impairments (multi-system involvement)   OT goals addressed during session: ADL's and self-care;Strengthening/ROM      AM-PAC OT "6 Clicks" Daily Activity     Outcome Measure   Help from another person eating meals?: A Little Help from another person taking care of personal grooming?: A Little Help from another person toileting, which includes using toliet, bedpan, or urinal?: Total Help from another person bathing (including washing, rinsing, drying)?: A Lot Help from another person to put on and taking off regular upper body clothing?: A Lot Help from another person to put on and taking off regular lower body clothing?: Total 6 Click Score: 12    End of Session    OT Visit Diagnosis: Other abnormalities of gait and mobility (R26.89);Muscle weakness (generalized) (M62.81);Pain   Activity Tolerance Patient tolerated treatment well   Patient Left in bed;with call bell/phone within reach;with bed alarm set (in chair position)   Nurse Communication          Time: [id number] OT Time Calculation (min): 36 min  Charges: OT General Charges $OT Visit: 1 Visit OT Treatments $Therapeutic Activity: 8-22 mins  Nestor Lewandowsky, OTR/L Acute Rehabilitation Services Pager: [phone] Office: [phone]    Malka So 03/23/2021, 11:56 AM

## 2021-03-23 NOTE — Progress Notes (Signed)
Subjective:  UOP remains good, 2400 - hemodynamically stable-   crt and BUN both down   Objective Vital signs in last 24 hours: Vitals:   03/23/21 0430 03/23/21 0500 03/23/21 0530 03/23/21 0600  BP: (!) 136/98 (!) 139/111 (!) 129/94 (!) 141/93  Pulse: 95 91 86 96  Resp: 19 16 20  (!) 24  Temp:      TempSrc:      SpO2: 97% 98% 100% 100%  Weight:      Height:       Weight change:   Intake/Output Summary (Last 24 hours) at 03/23/2021 0655 Last data filed at 03/23/2021 0600 Gross per 24 hour  Intake 1981.1 ml  Output 2590 ml  Net -608.9 ml    Assessment/ Plan: Pt is a 45 y.o. yo male with HIV/AIDS, DM, baseline CKD who was admitted on 03/04/2021 with acute hypoxic resp failure with sepsis and AKI  Assessment/Plan: 1. Renal-  reported baseline crt in the high 2's.  A on CRF in the setting of hypoxic resp failure and sepsis and also urinary retention.  Off and on CRRT-  most recently stopped on 7/15.  Since then making good urine and crt  down today for 2 days in a row.  No absolute indications for dialysis support-  that right IJ HD cath is 68 days old- will remove unless needed for access 2. VDRF-  trach-  on trach collar this AM- per CCM-  zyvox for PNA 3. Anemia- severe- on very low dose aranesp-  have inc-   iron stores low, will give iron-  s/p transfusion 7/18 as well   4. HTN/volume-  BP reasonable- norvasc , clonidine and maintenance IVF -  will stop IVF but give free water  5. Hypernatremia-  has been trending-  needs some free water, not sure if has tube still -  now is on dysphasia diet but does not seem to like the thickened stuff   Louis Meckel    Labs: Basic Metabolic Panel: Recent Labs  Lab 03/18/21 1600 03/19/21 0428 03/20/21 1026 03/21/21 0451 03/22/21 0508 03/23/21 0437  NA 135 137   < > 136 145 148*  K 4.4 3.9   < > 4.0 4.0 4.1  CL 102 101   < > 103 111 114*  CO2 26 26   < > 21* 23 23  GLUCOSE 182* 215*   < > 235* 185* 120*  BUN 43* 45*   < >  111* 113* 102*  CREATININE 1.69* 1.56*   < > 2.90* 2.56* 2.25*  CALCIUM 8.9 8.9   < > 8.8* 8.9 9.1  PHOS 2.9 2.2*  --  7.3*  --   --    < > = values in this interval not displayed.   Liver Function Tests: Recent Labs  Lab 03/19/21 0428 03/21/21 0451 03/22/21 0508  AST 41  --  20  ALT 13  --  13  ALKPHOS 90  --  63  BILITOT 0.6  --  0.6  PROT 7.5  --  6.8  ALBUMIN 2.1* 1.9* 1.8*   No results for input(s): LIPASE, AMYLASE in the last 168 hours. No results for input(s): AMMONIA in the last 168 hours. CBC: Recent Labs  Lab 03/19/21 0428 03/20/21 1026 03/21/21 0451 03/22/21 0508 03/22/21 1531 03/23/21 0437  WBC 19.4* 20.3* 18.1* 12.0* 12.3* 8.4  NEUTROABS 14.2*  --   --   --   --   --   HGB 7.5* 7.0*  7.2* 6.5* 8.3* 7.2*  HCT 24.9* 24.0* 24.2* 21.8* 27.4* 25.2*  MCV 98.0 98.4 96.8 97.8 95.5 97.7  PLT 169 171 208 200 234 228   Cardiac Enzymes: No results for input(s): CKTOTAL, CKMB, CKMBINDEX, TROPONINI in the last 168 hours. CBG: Recent Labs  Lab 03/22/21 1138 03/22/21 1500 03/22/21 1858 03/22/21 2259 03/23/21 0308  GLUCAP 150* 143* 160* 200* 99    Iron Studies:  Recent Labs    03/23/21 0437  IRON 18*  TIBC 196*  FERRITIN 877*   Studies/Results: DG Swallowing Func-Speech Pathology  Result Date: 03/22/2021 Formatting of this result is different from the original. Objective Swallowing Evaluation: Type of Study: MBS-Modified Barium Swallow Study  Patient Details Name: Bruce Hunter MRN: [id number] Date of Birth: [birth year] Today's Date: 03/22/2021 Time: SLP Start Time (ACUTE ONLY): 1242 -SLP Stop Time (ACUTE ONLY): 3474 SLP Time Calculation (min) (ACUTE ONLY): 10 min Past Medical History: Past Medical History: Diagnosis Date  Asthma   Diabetes mellitus without complication ([place])   Gout   Hyperlipidemia   Hypertension   Obesity  Past Surgical History: Past Surgical History: Procedure Laterality Date  BIOPSY  02/15/2021  Procedure: BIOPSY;  Surgeon: Ronnette Juniper, MD;   Location: [facility];  Service: Gastroenterology;;  COLONOSCOPY WITH PROPOFOL N/A 02/15/2021  Procedure: COLONOSCOPY WITH PROPOFOL;  Surgeon: Ronnette Juniper, MD;  Location: [facility];  Service: Gastroenterology;  Laterality: N/A;  ESOPHAGOGASTRODUODENOSCOPY (EGD) WITH PROPOFOL N/A 02/15/2021  Procedure: ESOPHAGOGASTRODUODENOSCOPY (EGD) WITH PROPOFOL;  Surgeon: Ronnette Juniper, MD;  Location: [facility];  Service: Gastroenterology;  Laterality: N/A;  GANGLION CYST EXCISION Right 02/26/2020  Procedure: EXCISION TOPHUS RIGHT FOOT;  Surgeon: Evelina Bucy, DPM;  Location: [facility];  Service: Podiatry;  Laterality: Right;  NO PAST SURGERIES   HPI: 45 y.o. male with a past medical history significant for HIV/AIDS, CKD, asthma, obesity, DM2 admitted for acute hypoxic respiratory failure with sepsis, requiring ventilator support on 7/1 and tracheostomy 7/13, CRRT.  Subjective: alert, upright in chair for procedure with PMSV in place Assessment / Plan / Recommendation CHL IP CLINICAL IMPRESSIONS 03/22/2021 Clinical Impression Pt presents with a mild oral and mild to moderate pharyngeal dysphagia, likely of multifactorial etiology. PMSV in place during entire duration of procedure; pt tolerating well. Pt with mild oral residuals with all POs, eventually spilling to the valleculae post intitial swallow (cues for second volitional swallows cleared). Piecemeal swallowing noted with solid PO, pt with good mastication and baseline adequate dentition. Pharyngeally, pt with incomplete epiglottic deflection, reduced laryngeal vestibule closure, and diminished sensation. This allowed for laryngeal penetration of thin liquids (PAS-2,3 via cup), (PAS 3, 8 thins via straw) post swallow silent tracheal aspiration of thins as penetrates progressed down trachea, (PAS-2/3 nectar thick liquids via cup), and (PAS-2 puree during the swallow). Airway protection was intact with soft solid and honey thick liquids. Recommend  conservative diet of honey thick liquids and dysphagia 3 (mechanical soft) consistencies with meds in puree (crush larger). Pt must utilize PMSV during all PO consumption. SLP to closely monitor for tolerance. Anticipate with extended time and PMSV usage, pt will be able to tolerate upgraded POs. SLP Visit Diagnosis Dysphagia, oropharyngeal phase (R13.12) Attention and concentration deficit following -- Frontal lobe and executive function deficit following -- Impact on safety and function Mild aspiration risk;Moderate aspiration risk   CHL IP TREATMENT RECOMMENDATION 03/22/2021 Treatment Recommendations Therapy as outlined in treatment plan below   Prognosis 03/22/2021 Prognosis for Safe Diet Advancement Good Barriers to Reach Goals  Time post onset Barriers/Prognosis Comment -- CHL IP DIET RECOMMENDATION 03/22/2021 SLP Diet Recommendations Honey thick liquids;Dysphagia 3 (Mech soft) solids Liquid Administration via Cup Medication Administration Whole meds with puree Compensations Slow rate;Small sips/bites Postural Changes Seated upright at 90 degrees;Remain semi-upright after after feeds/meals (Comment)   CHL IP OTHER RECOMMENDATIONS 03/22/2021 Recommended Consults -- Oral Care Recommendations Oral care BID Other Recommendations Order thickener from pharmacy;Place PMSV during PO intake   CHL IP FOLLOW UP RECOMMENDATIONS 03/22/2021 Follow up Recommendations Other (comment)   CHL IP FREQUENCY AND DURATION 03/22/2021 Speech Therapy Frequency (ACUTE ONLY) min 2x/week Treatment Duration 2 weeks      CHL IP ORAL PHASE 03/22/2021 Oral Phase Impaired Oral - Pudding Teaspoon -- Oral - Pudding Cup -- Oral - Honey Teaspoon -- Oral - Honey Cup Delayed oral transit;Lingual/palatal residue Oral - Nectar Teaspoon -- Oral - Nectar Cup Lingual/palatal residue;Delayed oral transit Oral - Nectar Straw -- Oral - Thin Teaspoon -- Oral - Thin Cup Lingual/palatal residue;Delayed oral transit Oral - Thin Straw Lingual/palatal residue;Delayed  oral transit Oral - Puree Delayed oral transit;Lingual/palatal residue Oral - Mech Soft Lingual/palatal residue;Decreased bolus cohesion;Piecemeal swallowing Oral - Regular -- Oral - Multi-Consistency -- Oral - Pill -- Oral Phase - Comment --  CHL IP PHARYNGEAL PHASE 03/22/2021 Pharyngeal Phase Impaired Pharyngeal- Pudding Teaspoon -- Pharyngeal -- Pharyngeal- Pudding Cup -- Pharyngeal -- Pharyngeal- Honey Teaspoon -- Pharyngeal -- Pharyngeal- Honey Cup Delayed swallow initiation-vallecula;Pharyngeal residue - valleculae Pharyngeal -- Pharyngeal- Nectar Teaspoon -- Pharyngeal -- Pharyngeal- Nectar Cup Delayed swallow initiation-pyriform sinuses;Reduced epiglottic inversion;Penetration/Aspiration during swallow;Pharyngeal residue - valleculae Pharyngeal Material enters airway, remains ABOVE vocal cords then ejected out;Material enters airway, remains ABOVE vocal cords and not ejected out Pharyngeal- Nectar Straw -- Pharyngeal -- Pharyngeal- Thin Teaspoon -- Pharyngeal -- Pharyngeal- Thin Cup Delayed swallow initiation-pyriform sinuses;Reduced epiglottic inversion;Penetration/Aspiration during swallow;Penetration/Apiration after swallow;Pharyngeal residue - valleculae Pharyngeal Material enters airway, remains ABOVE vocal cords then ejected out;Material enters airway, remains ABOVE vocal cords and not ejected out Pharyngeal- Thin Straw Delayed swallow initiation-pyriform sinuses;Reduced epiglottic inversion;Reduced airway/laryngeal closure;Penetration/Apiration after swallow;Penetration/Aspiration during swallow;Pharyngeal residue - valleculae Pharyngeal Material enters airway, remains ABOVE vocal cords and not ejected out;Material enters airway, passes BELOW cords without attempt by patient to eject out (silent aspiration) Pharyngeal- Puree Delayed swallow initiation-vallecula;Penetration/Aspiration during swallow;Reduced epiglottic inversion;Pharyngeal residue - valleculae Pharyngeal Material enters airway, remains  ABOVE vocal cords then ejected out;Material enters airway, remains ABOVE vocal cords and not ejected out Pharyngeal- Mechanical Soft Delayed swallow initiation-vallecula;Pharyngeal residue - valleculae Pharyngeal -- Pharyngeal- Regular -- Pharyngeal -- Pharyngeal- Multi-consistency -- Pharyngeal -- Pharyngeal- Pill -- Pharyngeal -- Pharyngeal Comment --  CHL IP CERVICAL ESOPHAGEAL PHASE 03/22/2021 Cervical Esophageal Phase WFL Pudding Teaspoon -- Pudding Cup -- Honey Teaspoon -- Honey Cup -- Nectar Teaspoon -- Nectar Cup -- Nectar Straw -- Thin Teaspoon -- Thin Cup -- Thin Straw -- Puree -- Mechanical Soft -- Regular -- Multi-consistency -- Pill -- Cervical Esophageal Comment -- Chelsea E Hartness MA, CCC-SLP 03/22/2021, 1:26 PM              Medications: Infusions:  sodium chloride 250 mL (03/21/21 1408)   sodium chloride Stopped (03/19/21 2158)   sodium chloride 50 mL/hr at 03/23/21 0600   clindamycin (CLEOCIN) IV Stopped (03/23/21 0545)    Scheduled Medications:  sodium chloride   Intravenous Once   alteplase  2 mg Intracatheter Once   alteplase  2 mg Intracatheter Once   amLODipine  10 mg Oral Daily   chlorhexidine gluconate (MEDLINE  KIT)  15 mL Mouth Rinse BID   Chlorhexidine Gluconate Cloth  6 each Topical Daily   clonazePAM  0.5 mg Oral BID   cloNIDine  0.2 mg Per Tube TID   [START ON 03/28/2021] darbepoetin (ARANESP) injection - NON-DIALYSIS  200 mcg Subcutaneous Q Sun-1800   dolutegravir  50 mg Oral Daily   gabapentin  100 mg Oral Q12H   heparin injection (subcutaneous)  5,000 Units Subcutaneous Q8H   insulin aspart  0-15 Units Subcutaneous Q4H   insulin glargine  5 Units Subcutaneous Daily   ipratropium-albuterol  3 mL Nebulization TID   lamiVUDine  300 mg Oral Daily   lidocaine  1 patch Transdermal Q24H   linezolid  600 mg Oral Q12H   mouth rinse  15 mL Mouth Rinse 10 times per day   oxyCODONE  5 mg Oral Q8H   pantoprazole  40 mg Oral QHS   predniSONE  20 mg Oral Q breakfast    primaquine  30 mg Oral Daily   sodium chloride flush  10-40 mL Intracatheter Q12H    have reviewed scheduled and prn medications.  Physical Exam: General: alert, c/o thirst Heart: RRR Lungs: CBS bilat Abdomen: soft, non tender Extremities: min edema if any Dialysis Access: right IJ cath in for 16 days-  will  remove-    03/23/2021,6:55 AM  LOS: 19 days

## 2021-03-23 NOTE — Progress Notes (Signed)
Subjective: No new complaints overnight. He feels he is improving. He is eating and drinking without difficulty.    Antibiotics:  Anti-infectives (From admission, onward)    Start     Dose/Rate Route Frequency Ordered Stop   03/23/21 1000  linezolid (ZYVOX) tablet 600 mg        600 mg Oral Every 12 hours 03/23/21 0002     03/23/21 1000  primaquine tablet 30 mg        30 mg Oral Daily 03/23/21 0002     03/23/21 1000  dolutegravir (TIVICAY) tablet 50 mg        50 mg Oral Daily 03/23/21 0002     03/22/21 1545  lamiVUDine (EPIVIR) tablet 300 mg        300 mg Oral Daily 03/22/21 1446     03/21/21 1430  lamiVUDine (EPIVIR) 10 MG/ML solution 300 mg  Status:  Discontinued        300 mg Per Tube Daily 03/21/21 1330 03/22/21 1446   03/18/21 1530  linezolid (ZYVOX) tablet 600 mg  Status:  Discontinued        600 mg Per Tube Every 12 hours 03/18/21 1431 03/23/21 0002   03/18/21 1515  linezolid (ZYVOX) 100 MG/5ML suspension 600 mg  Status:  Discontinued        600 mg Per Tube Every 12 hours 03/18/21 1427 03/18/21 1431   03/17/21 1000  primaquine tablet 30 mg  Status:  Discontinued        30 mg Per Tube Daily 03/17/21 0856 03/23/21 0002   03/16/21 1000  ceFAZolin (ANCEF) IVPB 1 g/50 mL premix  Status:  Discontinued        1 g 100 mL/hr over 30 Minutes Intravenous Every 12 hours 03/15/21 1251 03/15/21 1301   03/16/21 1000  ceFAZolin (ANCEF) 2 g in sodium chloride 0.9 % 100 mL IVPB  Status:  Discontinued        2 g 200 mL/hr over 30 Minutes Intravenous Every 12 hours 03/15/21 1302 03/18/21 1427   03/13/21 2200  fluconazole (DIFLUCAN) IVPB 400 mg        400 mg 100 mL/hr over 120 Minutes Intravenous Every 24 hours 03/13/21 1047 03/19/21 0031   03/13/21 1800  acyclovir (ZOVIRAX) 900 mg in dextrose 5 % 150 mL IVPB  Status:  Discontinued        900 mg 168 mL/hr over 60 Minutes Intravenous Every 24 hours 03/13/21 0958 03/13/21 1047   03/13/21 1800  acyclovir (ZOVIRAX) 900 mg in dextrose 5  % 150 mL IVPB  Status:  Discontinued        900 mg 168 mL/hr over 60 Minutes Intravenous Every 12 hours 03/13/21 1047 03/14/21 1700   03/13/21 1000  emtricitabine-tenofovir AF (DESCOVY) 200-25 MG per tablet 1 tablet  Status:  Discontinued        1 tablet Per Tube Daily 03/12/21 1350 03/21/21 1330   03/12/21 2200  vancomycin (VANCOREADY) IVPB 750 mg/150 mL  Status:  Discontinued        750 mg 150 mL/hr over 60 Minutes Intravenous Every 12 hours 03/12/21 1249 03/12/21 1806   03/12/21 2200  clindamycin (CLEOCIN) IVPB 900 mg        900 mg 100 mL/hr over 30 Minutes Intravenous Every 8 hours 03/12/21 1729     03/12/21 1815  primaquine tablet 30 mg  Status:  Discontinued        30 mg Oral Daily 03/12/21 1729  03/17/21 0856   03/12/21 1400  vancomycin (VANCOREADY) IVPB 1000 mg/200 mL        1,000 mg 200 mL/hr over 60 Minutes Intravenous  Once 03/12/21 1249 03/12/21 1540   03/12/21 1400  acyclovir (ZOVIRAX) 900 mg in dextrose 5 % 150 mL IVPB  Status:  Discontinued        900 mg 168 mL/hr over 60 Minutes Intravenous Every 12 hours 03/12/21 1249 03/13/21 0958   03/12/21 1300  cefTRIAXone (ROCEPHIN) 2 g in sodium chloride 0.9 % 100 mL IVPB  Status:  Discontinued        2 g 200 mL/hr over 30 Minutes Intravenous Every 12 hours 03/12/21 1203 03/15/21 1251   03/10/21 2200  fluconazole (DIFLUCAN) IVPB 200 mg  Status:  Discontinued        200 mg 100 mL/hr over 60 Minutes Intravenous Every 24 hours 03/10/21 1333 03/13/21 1047   03/10/21 1800  ceFAZolin (ANCEF) IVPB 2g/100 mL premix  Status:  Discontinued        2 g 200 mL/hr over 30 Minutes Intravenous Every 8 hours 03/10/21 1325 03/12/21 1203   03/09/21 2200  ceFAZolin (ANCEF) IVPB 2g/100 mL premix  Status:  Discontinued        2 g 200 mL/hr over 30 Minutes Intravenous Every 12 hours 03/09/21 1445 03/10/21 1325   03/09/21 2000  ceFAZolin (ANCEF) IVPB 2g/100 mL premix  Status:  Discontinued        2 g 200 mL/hr over 30 Minutes Intravenous Every 8 hours  03/09/21 1440 03/09/21 1445   03/09/21 1645  dapsone tablet 100 mg  Status:  Discontinued        100 mg Per Tube Daily 03/09/21 1556 03/12/21 1729   03/07/21 2200  primaquine tablet 30 mg  Status:  Discontinued        30 mg Per Tube Every 24 hours 03/07/21 0915 03/09/21 1439   03/07/21 1000  dolutegravir (TIVICAY) tablet 50 mg  Status:  Discontinued        50 mg Per Tube Daily 03/06/21 1132 03/23/21 0002   03/07/21 1000  lamiVUDine (EPIVIR) tablet 150 mg  Status:  Discontinued        150 mg Per Tube Daily 03/06/21 1324 03/12/21 1350   03/06/21 2200  fluconazole (DIFLUCAN) IVPB 400 mg  Status:  Discontinued        400 mg 100 mL/hr over 120 Minutes Intravenous Every 24 hours 03/06/21 1251 03/10/21 1333   03/06/21 1400  ceFEPIme (MAXIPIME) 2 g in sodium chloride 0.9 % 100 mL IVPB  Status:  Discontinued        2 g 200 mL/hr over 30 Minutes Intravenous Every 12 hours 03/06/21 1251 03/09/21 1439   03/05/21 2130  fluconazole (DIFLUCAN) IVPB 200 mg  Status:  Discontinued        200 mg 100 mL/hr over 60 Minutes Intravenous Every 24 hours 03/05/21 0810 03/06/21 1251   03/05/21 1000  dolutegravir (TIVICAY) tablet 50 mg  Status:  Discontinued        50 mg Oral Daily 03/04/21 2214 03/06/21 1132   03/05/21 1000  lamiVUDine (EPIVIR) tablet 300 mg  Status:  Discontinued        300 mg Oral Daily 03/04/21 2217 03/04/21 2217   03/05/21 1000  lamiVUDine (EPIVIR) tablet 150 mg  Status:  Discontinued        150 mg Oral Daily 03/04/21 2217 03/06/21 1324   03/04/21 2200  clindamycin (CLEOCIN) IVPB 900 mg  Status:  Discontinued        900 mg 100 mL/hr over 30 Minutes Intravenous Every 8 hours 03/04/21 2157 03/09/21 1439   03/04/21 2200  primaquine tablet 30 mg  Status:  Discontinued        30 mg Oral Every 24 hours 03/04/21 2157 03/07/21 0915   03/04/21 2145  vancomycin (VANCOREADY) IVPB 1500 mg/300 mL        1,500 mg 150 mL/hr over 120 Minutes Intravenous  Once 03/04/21 2144 03/05/21 0050   03/04/21 2145   ceFEPIme (MAXIPIME) 2 g in sodium chloride 0.9 % 100 mL IVPB  Status:  Discontinued        2 g 200 mL/hr over 30 Minutes Intravenous Every 24 hours 03/04/21 2144 03/06/21 1251   03/04/21 2143  vancomycin variable dose per unstable renal function (pharmacist dosing)  Status:  Discontinued         Does not apply See admin instructions 03/04/21 2144 03/06/21 0813   03/04/21 2130  fluconazole (DIFLUCAN) IVPB 400 mg  Status:  Discontinued        400 mg 100 mL/hr over 120 Minutes Intravenous Every 24 hours 03/04/21 2126 03/05/21 0810   03/04/21 1915  cefTRIAXone (ROCEPHIN) 1 g in sodium chloride 0.9 % 100 mL IVPB        1 g 200 mL/hr over 30 Minutes Intravenous  Once 03/04/21 1914 03/04/21 2045   03/04/21 1915  azithromycin (ZITHROMAX) 500 mg in sodium chloride 0.9 % 250 mL IVPB        500 mg 250 mL/hr over 60 Minutes Intravenous  Once 03/04/21 1914 03/04/21 2201       Medications: Scheduled Meds:  sodium chloride   Intravenous Once   alteplase  2 mg Intracatheter Once   alteplase  2 mg Intracatheter Once   amLODipine  10 mg Oral Daily   chlorhexidine gluconate (MEDLINE KIT)  15 mL Mouth Rinse BID   Chlorhexidine Gluconate Cloth  6 each Topical Daily   clonazePAM  0.5 mg Oral BID   cloNIDine  0.1 mg Oral TID   Followed by   Derrill Memo ON 03/24/2021] cloNIDine  0.1 mg Oral BID   Followed by   Derrill Memo ON 03/25/2021] cloNIDine  0.1 mg Oral Daily   [START ON 03/28/2021] darbepoetin (ARANESP) injection - NON-DIALYSIS  200 mcg Subcutaneous Q Sun-1800   dolutegravir  50 mg Oral Daily   gabapentin  100 mg Oral Q12H   heparin injection (subcutaneous)  5,000 Units Subcutaneous Q8H   insulin aspart  0-15 Units Subcutaneous TID WC   insulin aspart  0-5 Units Subcutaneous QHS   insulin glargine  5 Units Subcutaneous Daily   ipratropium-albuterol  3 mL Nebulization TID   lamiVUDine  300 mg Oral Daily   lidocaine  1 patch Transdermal Q24H   linezolid  600 mg Oral Q12H   mouth rinse  15 mL Mouth Rinse  [id number]   oxyCODONE  5 mg Oral Q8H   predniSONE  20 mg Oral Q breakfast   primaquine  30 mg Oral Daily   sodium chloride flush  10-40 mL Intracatheter Q12H   tamsulosin  0.4 mg Oral Daily   Continuous Infusions:  sodium chloride 250 mL (03/21/21 1408)   sodium chloride Stopped (03/19/21 2158)   clindamycin (CLEOCIN) IV 900 mg (03/23/21 1319)   dextrose 50 mL/hr at 03/23/21 1300   ferric gluconate (FERRLECIT) IVPB Stopped (03/23/21 1019)   PRN Meds:.acetaminophen, albuterol, docusate sodium, fentaNYL (SUBLIMAZE) injection, heparin, labetalol, lip  balm, midazolam, ondansetron (ZOFRAN) IV, polyethylene glycol, sodium chloride flush    Objective: Weight change:   Intake/Output Summary (Last 24 hours) at 03/23/2021 1419 Last data filed at 03/23/2021 1300 Gross per 24 hour  Intake 1968 ml  Output 2530 ml  Net -562 ml   Blood pressure 130/83, pulse 85, temperature 98.1 F (36.7 C), temperature source Oral, resp. rate (!) 24, height 5' 10.98" (1.803 m), weight 79.6 kg, SpO2 100 %. Temp:  [98.1 F (36.7 C)-98.5 F (36.9 C)] 98.1 F (36.7 C) (07/19 0700) Pulse Rate:  [57-115] 85 (07/19 1300) Resp:  [15-29] 24 (07/19 1300) BP: (116-160)/(79-111) 130/83 (07/19 1300) SpO2:  [90 %-100 %] 100 % (07/19 1300) FiO2 (%):  [28 %-100 %] 28 % (07/19 1135)  Physical Exam: Physical Exam   CBC:    BMET Recent Labs    03/22/21 0508 03/23/21 0437  NA 145 148*  K 4.0 4.1  CL 111 114*  CO2 23 23  GLUCOSE 185* 120*  BUN 113* 102*  CREATININE 2.56* 2.25*  CALCIUM 8.9 9.1     Liver Panel  Recent Labs    03/21/21 0451 03/22/21 0508  PROT  --  6.8  ALBUMIN 1.9* 1.8*  AST  --  20  ALT  --  13  ALKPHOS  --  63  BILITOT  --  0.6     Micro Results: Recent Results (from the past 720 hour(s))  Culture, blood (routine x 2)     Status: None   Collection Time: 03/04/21  6:14 PM   Specimen: BLOOD LEFT FOREARM  Result Value Ref Range Status   Specimen Description   Final    BLOOD  LEFT FOREARM Performed at [facility], [place] [address] [place], [place] [address]    Special Requests   Final    BOTTLES DRAWN AEROBIC AND ANAEROBIC Blood Culture adequate volume Performed at [place] [address] [place], [place] [address]    Culture   Final    NO GROWTH 5 DAYS Performed at [facility], [place] [address], [place], [place] [address]    Report Status 03/09/2021 FINAL  Final  Resp Panel by RT-PCR (Flu A&B, Covid) Nasopharyngeal Swab     Status: None   Collection Time: 03/04/21  7:52 PM   Specimen: Nasopharyngeal Swab; Nasopharyngeal(NP) swabs in vial transport medium  Result Value Ref Range Status   SARS Coronavirus 2 by RT PCR NEGATIVE NEGATIVE Final    Comment: (NOTE) SARS-CoV-2 target nucleic acids are NOT DETECTED.  The SARS-CoV-2 RNA is generally detectable in upper respiratory specimens during the acute phase of infection. The lowest concentration of SARS-CoV-2 viral copies this assay can detect is 138 copies/mL. A negative result does not preclude SARS-Cov-2 infection and should not be used as the sole basis for treatment or other patient management decisions. A negative result may occur with  improper specimen collection/handling, submission of specimen other than nasopharyngeal swab, presence of viral mutation(s) within the areas targeted by this assay, and inadequate number of viral copies(<138 copies/mL). A negative result must be combined with clinical observations, patient history, and epidemiological information. The expected result is Negative.  Fact Sheet for Patients:  EntrepreneurPulse.com.au  Fact Sheet for Healthcare Providers:  IncredibleEmployment.be  This test is no t yet approved or cleared by the [place] FDA and  has been authorized for detection and/or diagnosis of SARS-CoV-2 by FDA under an Emergency Use Authorization (EUA). This EUA will  remain  in effect (meaning this test can be used) for the duration of the COVID-19 declaration under Section 564(b)(1) of the Act, 21 U.S.C.section 360bbb-3(b)(1), unless the authorization is terminated  or revoked sooner.       Influenza A by PCR NEGATIVE NEGATIVE Final   Influenza B by PCR NEGATIVE NEGATIVE Final    Comment: (NOTE) The Xpert Xpress SARS-CoV-2/FLU/RSV plus assay is intended as an aid in the diagnosis of influenza from Nasopharyngeal swab specimens and should not be used as a sole basis for treatment. Nasal washings and aspirates are unacceptable for Xpert Xpress SARS-CoV-2/FLU/RSV testing.  Fact Sheet for Patients: EntrepreneurPulse.com.au  Fact Sheet for Healthcare Providers: IncredibleEmployment.be  This test is not yet approved or cleared by the [place] FDA and has been authorized for detection and/or diagnosis of SARS-CoV-2 by FDA under an Emergency Use Authorization (EUA). This EUA will remain in effect (meaning this test can be used) for the duration of the COVID-19 declaration under Section 564(b)(1) of the Act, 21 U.S.C. section 360bbb-3(b)(1), unless the authorization is terminated or revoked.  Performed at [facility], [place] [address] [place], [place] [address]   MRSA PCR Screening     Status: None   Collection Time: 03/05/21 10:41 AM  Result Value Ref Range Status   MRSA by PCR NEGATIVE NEGATIVE Final    Comment:        The GeneXpert MRSA Assay (FDA approved for NASAL specimens only), is one component of a comprehensive MRSA colonization surveillance program. It is not intended to diagnose MRSA infection nor to guide or monitor treatment for MRSA infections. Performed at [facility], [place] [address], [place], [place] [address]   Respiratory (~20 pathogens) panel by PCR     Status: None   Collection Time: 03/05/21 10:42 AM   Specimen: Nasopharyngeal Swab;  Respiratory  Result Value Ref Range Status   Adenovirus NOT DETECTED NOT DETECTED Final   Coronavirus 229E NOT DETECTED NOT DETECTED Final    Comment: (NOTE) The Coronavirus on the Respiratory Panel, DOES NOT test for the novel  Coronavirus (2019 nCoV)    Coronavirus HKU1 NOT DETECTED NOT DETECTED Final   Coronavirus NL63 NOT DETECTED NOT DETECTED Final   Coronavirus OC43 NOT DETECTED NOT DETECTED Final   Metapneumovirus NOT DETECTED NOT DETECTED Final   Rhinovirus / Enterovirus NOT DETECTED NOT DETECTED Final   Influenza A NOT DETECTED NOT DETECTED Final   Influenza B NOT DETECTED NOT DETECTED Final   Parainfluenza Virus 1 NOT DETECTED NOT DETECTED Final   Parainfluenza Virus 2 NOT DETECTED NOT DETECTED Final   Parainfluenza Virus 3 NOT DETECTED NOT DETECTED Final   Parainfluenza Virus 4 NOT DETECTED NOT DETECTED Final   Respiratory Syncytial Virus NOT DETECTED NOT DETECTED Final   Bordetella pertussis NOT DETECTED NOT DETECTED Final   Bordetella Parapertussis NOT DETECTED NOT DETECTED Final   Chlamydophila pneumoniae NOT DETECTED NOT DETECTED Final   Mycoplasma pneumoniae NOT DETECTED NOT DETECTED Final    Comment: Performed at [facility], [place]. [address] [place], [place] [address]  Gastrointestinal Panel by PCR , Stool     Status: None   Collection Time: 03/05/21  6:28 PM   Specimen: Stool  Result Value Ref Range Status   Campylobacter species NOT DETECTED NOT DETECTED Final   Plesimonas shigelloides NOT DETECTED NOT DETECTED Final   Salmonella species NOT DETECTED NOT DETECTED Final   Yersinia enterocolitica NOT DETECTED NOT DETECTED Final  Vibrio species NOT DETECTED NOT DETECTED Final   Vibrio cholerae NOT DETECTED NOT DETECTED Final   Enteroaggregative E coli (EAEC) NOT DETECTED NOT DETECTED Final   Enteropathogenic E coli (EPEC) NOT DETECTED NOT DETECTED Final   Enterotoxigenic E coli (ETEC) NOT DETECTED NOT DETECTED Final   Shiga like toxin producing E coli  (STEC) NOT DETECTED NOT DETECTED Final   Shigella/Enteroinvasive E coli (EIEC) NOT DETECTED NOT DETECTED Final   Cryptosporidium NOT DETECTED NOT DETECTED Final   Cyclospora cayetanensis NOT DETECTED NOT DETECTED Final   Entamoeba histolytica NOT DETECTED NOT DETECTED Final   Giardia lamblia NOT DETECTED NOT DETECTED Final   Adenovirus F40/41 NOT DETECTED NOT DETECTED Final   Astrovirus NOT DETECTED NOT DETECTED Final   Norovirus GI/GII NOT DETECTED NOT DETECTED Final   Rotavirus A NOT DETECTED NOT DETECTED Final   Sapovirus (I, II, IV, and V) NOT DETECTED NOT DETECTED Final    Comment: Performed at [facility], [place] [place], [place] [address]  Pneumocystis smear by DFA     Status: None   Collection Time: 03/06/21  1:09 PM   Specimen: Sputum; Respiratory  Result Value Ref Range Status   Specimen Source-PJSRC SPUTUM  Final   Pneumocystis jiroveci Ag NEGATIVE  Final    Comment: Performed at [place] Performed at [place] [facility] Performed at [facility], [place] [address], [place], [place] [address]   Culture, Respiratory w Gram Stain     Status: None   Collection Time: 03/06/21  1:10 PM   Specimen: Tracheal Aspirate; Respiratory  Result Value Ref Range Status   Specimen Description   Final    TRACHEAL ASPIRATE Performed at [place] [address], [place], [place] [address]    Special Requests   Final    Immunocompromised Performed at [facility], [place] [address], [place], [place] [address]    Gram Stain   Final    FEW WBC PRESENT,BOTH PMN AND MONONUCLEAR RARE SQUAMOUS EPITHELIAL CELLS PRESENT RARE GRAM POSITIVE RODS RARE GRAM POSITIVE COCCI IN PAIRS Performed at [facility], [place] [address], [place], [place] [address]    Culture RARE STAPHYLOCOCCUS AUREUS  Final   Report Status 03/09/2021 FINAL  Final   Organism ID, Bacteria STAPHYLOCOCCUS AUREUS  Final       Susceptibility   Staphylococcus aureus - MIC*    CIPROFLOXACIN <=0.5 SENSITIVE Sensitive     ERYTHROMYCIN <=0.25 SENSITIVE Sensitive     GENTAMICIN <=0.5 SENSITIVE Sensitive     OXACILLIN <=0.25 SENSITIVE Sensitive     TETRACYCLINE >=16 RESISTANT Resistant     VANCOMYCIN <=0.5 SENSITIVE Sensitive     TRIMETH/SULFA <=10 SENSITIVE Sensitive     CLINDAMYCIN <=0.25 SENSITIVE Sensitive     RIFAMPIN <=0.5 SENSITIVE Sensitive     Inducible Clindamycin NEGATIVE Sensitive     * RARE STAPHYLOCOCCUS AUREUS  Blastomyces Antigen     Status: None   Collection Time: 03/12/21  5:55 PM   Specimen: Blood  Result Value Ref Range Status   Blastomyces Antigen None Detected None Detected ng/mL Final    Comment: (NOTE) Results reported as ng/mL in 0.2 - 14.7 ng/mL range Results above the limit of detection but below 0.2 ng/mL are reported as 'Positive, Below the Limit of Quantification' Results above 14.7 ng/mL are reported as 'Positive, Above the Limit of Quantification'    Specimen Type SERUM  Final  Comment: (NOTE) Performed At: [facility] [place], [place] [id number] Bruce Donath MD BJ:[id number]   Virus culture     Status: None   Collection Time: 03/12/21  7:08 PM   Specimen: CSF  Result Value Ref Range Status   Viral Culture Comment  Final    Comment: (NOTE) Preliminary Report: No virus isolated at 4 days.  Next report to follow after 7 days. Performed At: [facility] [place], [place] [id number] Rush Farmer MD IR:[id number]    Source of Sample BLOOD  Final    Comment: Performed at [facility], [place] [address], [place], [place] [address]  Virus culture     Status: None   Collection Time: 03/13/21  4:42 PM   Specimen: PATH Cytology CSF; Cerebrospinal Fluid  Result Value Ref Range Status   Viral Culture No virus isolated.  Corrected    Comment: (NOTE) Please indicate source of specimen on all future  request forms. Performed At: [facility] [address] [place], [place] [id number] Rush Farmer MD RC:[id number] CORRECTED ON 07/19 AT 1036: PREVIOUSLY REPORTED AS Comment    Source of Sample CSF  Final    Comment: Performed at [facility], [place] [address], [place], [place] [address]  CSF culture w Gram Stain     Status: None   Collection Time: 03/13/21  4:42 PM   Specimen: PATH Cytology CSF; Cerebrospinal Fluid  Result Value Ref Range Status   Specimen Description   Final    CSF Performed at [place] [address], [place], [place] [address]    Special Requests   Final    NONE Performed at [facility], [place] [address], [place], [place] [address]    Gram Stain   Final    NO ORGANISMS SEEN Gram Stain Report Called to,Read Back By and Verified With: ETHAN RN AT 3614 ON 03/13/21 BY S.VANHOORNE Performed at [facility], [place] [address] [place], [place] [address]    Culture   Final    NO GROWTH 3 DAYS Performed at [facility], [place] [address] [place], [place] [address]    Report Status 03/17/2021 FINAL  Final  Fungus Culture With Stain     Status: Abnormal   Collection Time: 03/13/21  4:42 PM   Specimen: PATH Cytology CSF; Cerebrospinal Fluid  Result Value Ref Range Status   Fungus Stain QNSTST (A)  Final    Comment: (NOTE) Test not performed. Insufficient specimen to perform or complete analysis.      Darrick Meigs S. was notified 03/17/2021.    Fungus (Mycology) Culture QNSTST (A)  Final    Comment: (NOTE) Test not performed. Insufficient specimen to perform or complete analysis.      Darrick Meigs S. was notified 03/17/2021. Performed At: [facility] [place], [place] [id number] Rush Farmer MD ZT:[id number]    Fungal Source CSF  Final    Comment: Performed at [place] [address] [place], [place] [address]  Culture, fungus  without smear     Status: None (Preliminary result)   Collection Time: 03/13/21  4:42 PM   Specimen: PATH Cytology CSF; Cerebrospinal Fluid  Result Value Ref Range Status   Specimen Description   Final    CSF Performed at [place] [address], [place], [place] [address]    Special Requests   Final    NONE Performed  at [facility], [place] [address] [place], [place] [address]    Culture   Final    NO FUNGUS ISOLATED AFTER 10 DAYS Performed at [facility], [place] [address], [place], [place] [address]    Report Status PENDING  Incomplete  Culture, Respiratory w Gram Stain     Status: None   Collection Time: 03/14/21  3:15 AM   Specimen: Tracheal Aspirate; Respiratory  Result Value Ref Range Status   Specimen Description   Final    TRACHEAL ASPIRATE Performed at [place] [address] [place], [place] [address]    Special Requests   Final    NONE Performed at [facility], [place] [address], [place], [place] [address]    Gram Stain   Final    MODERATE WBC PRESENT,BOTH PMN AND MONONUCLEAR NO ORGANISMS SEEN    Culture   Final    NO GROWTH 2 DAYS Performed at [facility], [place] [address] [place], [place] [address]    Report Status 03/16/2021 FINAL  Final  Culture, Respiratory w Gram Stain     Status: None   Collection Time: 03/16/21  9:40 AM   Specimen: Bronchoalveolar Lavage; Respiratory  Result Value Ref Range Status   Specimen Description   Final    BRONCHIAL ALVEOLAR LAVAGE Performed at [place] [address] [place], [place] [address]    Special Requests   Final    NONE Performed at [facility], [place] [address], [place], [place] [address]    Gram Stain   Final    FEW WBC PRESENT,BOTH PMN AND MONONUCLEAR NO ORGANISMS SEEN    Culture   Final    RARE Normal respiratory flora-no Staph aureus or Pseudomonas seen Performed at  [facility], [address] [place], [place] [address]    Report Status 03/18/2021 FINAL  Final  Fungus Culture With Stain     Status: None (Preliminary result)   Collection Time: 03/16/21  9:40 AM   Specimen: Bronchoalveolar Lavage  Result Value Ref Range Status   Fungus Stain Final report  Final    Comment: (NOTE) Performed At: [facility] [place], [place] [id number] Rush Farmer MD [id number]    Fungus (Mycology) Culture PENDING  Incomplete   Fungal Source BRONCHIAL ALVEOLAR LAVAGE  Final    Comment: Performed at [facility], [place] [address], [place], [place] [address]  Pneumocystis smear by DFA     Status: None   Collection Time: 03/16/21  9:40 AM   Specimen: Bronchoalveolar Lavage; Respiratory  Result Value Ref Range Status   Specimen Source-PJSRC BRONCHIAL ALVEOLAR LAVAGE  Final   Pneumocystis jiroveci Ag NEGATIVE  Final    Comment: Performed at [place] [address] [place],  [address]  Fungus Culture Result     Status: None   Collection Time: 03/16/21  9:40 AM  Result Value Ref Range Status   Result 1 Comment  Final    Comment: (NOTE) KOH/Calcofluor preparation:  no fungus observed. Performed At: [facility] [place], [place] [id number] Rush Farmer MD [id number]   Fungus Culture With Stain     Status: None (Preliminary result)   Collection Time: 03/16/21  9:41 AM   Specimen: Bronchoalveolar Lavage  Result Value Ref Range Status   Fungus Stain Final report  Final    Comment: (NOTE) Performed At: [facility] [place]  [place], [place] [id number] Rush Farmer MD [id number]    Fungus (Mycology) Culture PENDING  Incomplete   Fungal Source BRONCHIAL ALVEOLAR LAVAGE  Final    Comment: Performed at [facility] [address] [place], [place] [address]  Culture, Respiratory w Gram Stain     Status: None    Collection Time: 03/16/21  9:41 AM   Specimen: Bronchoalveolar Lavage; Respiratory  Result Value Ref Range Status   Specimen Description   Final    BRONCHIAL ALVEOLAR LAVAGE Performed at [facility] [address], [place], [place] [address]    Special Requests   Final    NONE Performed at [facility], [place] [address] [place], [place] [address]    Gram Stain   Final    NO WBC SEEN NO ORGANISMS SEEN Performed at [facility], [place] [address], [place], [place] [address]    Culture RARE STAPHYLOCOCCUS EPIDERMIDIS  Final   Report Status 03/18/2021 FINAL  Final   Organism ID, Bacteria STAPHYLOCOCCUS EPIDERMIDIS  Final      Susceptibility   Staphylococcus epidermidis - MIC*    CIPROFLOXACIN 4 RESISTANT Resistant     ERYTHROMYCIN >=8 RESISTANT Resistant     GENTAMICIN <=0.5 SENSITIVE Sensitive     OXACILLIN >=4 RESISTANT Resistant     TETRACYCLINE >=16 RESISTANT Resistant     VANCOMYCIN 1 SENSITIVE Sensitive     TRIMETH/SULFA 40 SENSITIVE Sensitive     CLINDAMYCIN >=8 RESISTANT Resistant     RIFAMPIN 2 INTERMEDIATE Intermediate     Inducible Clindamycin NEGATIVE Sensitive     * RARE STAPHYLOCOCCUS EPIDERMIDIS  Fungus Culture Result     Status: None   Collection Time: 03/16/21  9:41 AM  Result Value Ref Range Status   Result 1 Comment  Final    Comment: (NOTE) KOH/Calcofluor preparation:  no fungus observed. Performed At: [facility] [place], [place] [id number] Rush Farmer MD VO:[id number]       Assessment/Plan:  INTERVAL HISTORY: Patient continues to improve clinically. His O2 requirement remains stable. 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 male with HIV and AIDS. He was admitted now with respiratory failure and concern for ARDS. He has been treated empirically for PCP  with clindamycin and primaquine  + prednisone for possible IRIS for a total of 21 d. This is despite PCP Ag and PCR being negative though this is in the setting of treatment prior to testing and worsening clinical status of patient off of PCP treatment. He has also completed a course of linezolid for MSSA and MRSE PNA.      #1 Hypoxemic Respiratory failure: He is currently saturating at 100% on 5L trach collar. Continue clindamycin, primaquine and prednisone. Discontinue linezolid for now.    #2 HIV/AIDS: Responded dramatically to Dovato. Continue Dovato.    #3 Renal failure: Likely due to ATN 2/2 hypotension  in the setting of sepsis. Renal function improving. Continue to monitor.     LOS: 19 days   Althea Grimmer 03/23/2021, 2:19 PM

## 2021-03-23 NOTE — Progress Notes (Signed)
  Speech Language Pathology Treatment: Dysphagia  Patient Details Name: Bruce Hunter MRN: [id number] DOB: 06/25/1976 Today's Date: 03/23/2021 Time: [id number] SLP Time Calculation (min) (ACUTE ONLY): 33 min  Assessment / Plan / Recommendation Clinical Impression  Followed up for dysphagia management and PMSV intervention. Pt with PMSV in place, continues to tolerate well. Educated pt regarding donning, and doffing, importance of utilizing with all PO consumption, and during wake hours to improve secretion management. Pt verbalized understanding, precautions reinforced and posted at bedside. Pt with strong volitional cough and able to expel tracheal secretions with suction at inner cannula, not requiring deep tracheal suction.   Reinforced rationale for pts current diet and findings from MBSS yesterday. RN denies any trouble with current diet. Pt alert, requested some regular water. Provided diligent oral care and repositioned pt fully upright. Assessed with ice chips and small sips of water, pt without overt s/sx of aspiration with any PO. SLP present during RN med administration whole in puree (pt did chew, though stated he could swallow whole). Recommend additional of water/ice chip protocol (regular water, small sips, and ice chips in between meals following diligent oral care). SLP to continue to follow for diet advancement as pt tolerates.    HPI HPI: 45 y.o. male with a past medical history significant for HIV/AIDS, CKD, asthma, obesity, DM2 admitted for acute hypoxic respiratory failure with sepsis, requiring ventilator support on 7/1 and tracheostomy 7/13, CRRT.      SLP Plan  Continue with current plan of care       Recommendations  Diet recommendations: Dysphagia 3 (mechanical soft);Honey-thick liquid;Other(comment) (ice chips and regular water in between meals following diligent oral care) Liquids provided via: Cup;Teaspoon Medication Administration: Crushed with  puree Supervision: Full supervision/cueing for compensatory strategies;Staff to assist with self feeding Compensations: Slow rate;Small sips/bites;Multiple dry swallows after each bite/sip;Clear throat intermittently Postural Changes and/or Swallow Maneuvers: Seated upright 90 degrees;Upright 30-60 min after meal      Patient may use Passy-Muir Speech Valve: During all waking hours (remove during sleep) PMSV Supervision: Intermittent MD: Please consider changing trach tube to : Smaller size;Cuffless         Oral Care Recommendations: Oral care QID Follow up Recommendations: 24 hour supervision/assistance;Home health SLP SLP Visit Diagnosis: Dysphagia, oropharyngeal phase (R13.12);Aphonia (R49.1) Plan: Continue with current plan of care       [place], CCC-SLP Acute Rehabilitation Services   03/23/2021, 9:54 AM

## 2021-03-24 DIAGNOSIS — B20 Human immunodeficiency virus [HIV] disease: Secondary | ICD-10-CM | POA: Diagnosis not present

## 2021-03-24 DIAGNOSIS — N179 Acute kidney failure, unspecified: Secondary | ICD-10-CM | POA: Diagnosis not present

## 2021-03-24 DIAGNOSIS — G9341 Metabolic encephalopathy: Secondary | ICD-10-CM | POA: Diagnosis not present

## 2021-03-24 DIAGNOSIS — B59 Pneumocystosis: Secondary | ICD-10-CM | POA: Diagnosis not present

## 2021-03-24 DIAGNOSIS — J9601 Acute respiratory failure with hypoxia: Secondary | ICD-10-CM | POA: Diagnosis not present

## 2021-03-24 LAB — RENAL FUNCTION PANEL
Albumin: 2 g/dL — ABNORMAL LOW (ref 3.5–5.0)
Anion gap: 8 (ref 5–15)
BUN: 71 mg/dL — ABNORMAL HIGH (ref 6–20)
CO2: 22 mmol/L (ref 22–32)
Calcium: 8.9 mg/dL (ref 8.9–10.3)
Chloride: 108 mmol/L (ref 98–111)
Creatinine, Ser: 1.71 mg/dL — ABNORMAL HIGH (ref 0.61–1.24)
GFR, Estimated: 50 mL/min — ABNORMAL LOW (ref >60)
Glucose, Bld: 167 mg/dL — ABNORMAL HIGH (ref 70–99)
Phosphorus: 4.7 mg/dL — ABNORMAL HIGH (ref 2.5–4.6)
Potassium: 3.9 mmol/L (ref 3.5–5.1)
Sodium: 138 mmol/L (ref 135–145)

## 2021-03-24 LAB — VIRUS CULTURE

## 2021-03-24 LAB — GLUCOSE, CAPILLARY
Glucose-Capillary: 151 mg/dL — ABNORMAL HIGH (ref 70–99)
Glucose-Capillary: 113 mg/dL — ABNORMAL HIGH (ref 70–99)
Glucose-Capillary: 165 mg/dL — ABNORMAL HIGH (ref 70–99)
Glucose-Capillary: 124 mg/dL — ABNORMAL HIGH (ref 70–99)

## 2021-03-24 MED ORDER — ADULT MULTIVITAMIN W/MINERALS CH
1.0000 | ORAL_TABLET | Freq: Every day | ORAL | Status: DC
  Administered 2021-03-24 – 2021-03-30 (×8): 1 via ORAL
  Filled 2021-03-24 (×7): qty 1

## 2021-03-24 NOTE — Progress Notes (Signed)
eLink Physician-Brief Progress Note Patient Name: Bruce Hunter DOB: February 14, 1976 MRN: [id number]   Date of Service  03/24/2021  HPI/Events of Note  Patient with urinary retention, 903 ml of urine in the bladder on bladder scan.  eICU Interventions  Insertion of Foley catheter ordered.        Kerry Kass Elmus Mathes 03/24/2021, 6:02 AM

## 2021-03-24 NOTE — Progress Notes (Signed)
  Speech Language Pathology Treatment: Dysphagia;Passy Muir Speaking valve  Patient Details Name: Bruce Hunter MRN: [id number] DOB: 1975/11/20 Today's Date: 03/24/2021 Time: [id number] SLP Time Calculation (min) (ACUTE ONLY): 12 min  Assessment / Plan / Recommendation Clinical Impression  PASSY MUIR VALVE Pt with cuff deflated and PMSV in place on SLP arrival.  Pt was eating a puree snack.  Pt tolerated PMV placement throughout session.  RN reports pt had become drowsy/lethargic while wearing valve today and it was removed by MD.  Pt demonstrated donning/doffing independently.  Pt independently verbalized when to wear and when to remove PMV.  Pt was able to attain leak speech without valve in place. Provided education regarding benefits of PMV placement.  Pt removed PMV per his preference prior to SLP departure.  Please provide indirect supervision throughout the day to ensure that pt has not inadvertently fallen asleep with valve in place.    SWALLOWING Pt independently consumed puree and honey thick liquid with PMSV in place.  There were no clinical s/s of aspiration.  Pt had honey thick drink at beside in cup with straw and ice.  Provided education to pt and RN (who did not provide beverage) regarding thickened liquid consistencies with ice.  Reinforced ice chip/free water protocol.  Pt verbalized understanding and also resignation ("I don't have a choice").  Pt may benefit from a repeat MBSS early next week to determine if he can advance diet consistencies. Pt would also likely benefit to change of trach to cuffless and/or smaller size.     HPI HPI: 45 y.o. male with a past medical history significant for HIV/AIDS, CKD, asthma, obesity, DM2 admitted for acute hypoxic respiratory failure with sepsis, requiring ventilator support on 7/1 and tracheostomy 7/13, CRRT.      SLP Plan  Continue with current plan of care       Recommendations  Diet recommendations: Dysphagia 3 (mechanical  soft);Honey-thick liquid Liquids provided via: Cup Medication Administration: Crushed with puree Supervision: Intermittent supervision to cue for compensatory strategies Compensations: Slow rate;Small sips/bites;Multiple dry swallows after each bite/sip;Clear throat intermittently Postural Changes and/or Swallow Maneuvers: Seated upright 90 degrees;Upright 30-60 min after meal      Patient may use Passy-Muir Speech Valve: During all waking hours (remove during sleep) (Indirect supervision) PMSV Supervision: Intermittent MD: Please consider changing trach tube to : Smaller size;Cuffless         Oral Care Recommendations: Oral care QID Follow up Recommendations: 24 hour supervision/assistance;Home health SLP SLP Visit Diagnosis: Dysphagia, oropharyngeal phase (R13.12);Aphonia (R49.1) Plan: Continue with current plan of care       [place], [place], [place] Office: [phone] 03/24/2021, 12:18 PM

## 2021-03-24 NOTE — Progress Notes (Signed)
Subjective: No new complaints overnight. He feels he is continuing to improve.    Antibiotics:  Anti-infectives (From admission, onward)    Start     Dose/Rate Route Frequency Ordered Stop   03/23/21 1000  linezolid (ZYVOX) tablet 600 mg        600 mg Oral Every 12 hours 03/23/21 0002 03/24/21 2359   03/23/21 1000  primaquine tablet 30 mg        30 mg Oral Daily 03/23/21 0002     03/23/21 1000  dolutegravir (TIVICAY) tablet 50 mg        50 mg Oral Daily 03/23/21 0002     03/22/21 1545  lamiVUDine (EPIVIR) tablet 300 mg        300 mg Oral Daily 03/22/21 1446     03/21/21 1430  lamiVUDine (EPIVIR) 10 MG/ML solution 300 mg  Status:  Discontinued        300 mg Per Tube Daily 03/21/21 1330 03/22/21 1446   03/18/21 1530  linezolid (ZYVOX) tablet 600 mg  Status:  Discontinued        600 mg Per Tube Every 12 hours 03/18/21 1431 03/23/21 0002   03/18/21 1515  linezolid (ZYVOX) 100 MG/5ML suspension 600 mg  Status:  Discontinued        600 mg Per Tube Every 12 hours 03/18/21 1427 03/18/21 1431   03/17/21 1000  primaquine tablet 30 mg  Status:  Discontinued        30 mg Per Tube Daily 03/17/21 0856 03/23/21 0002   03/16/21 1000  ceFAZolin (ANCEF) IVPB 1 g/50 mL premix  Status:  Discontinued        1 g 100 mL/hr over 30 Minutes Intravenous Every 12 hours 03/15/21 1251 03/15/21 1301   03/16/21 1000  ceFAZolin (ANCEF) 2 g in sodium chloride 0.9 % 100 mL IVPB  Status:  Discontinued        2 g 200 mL/hr over 30 Minutes Intravenous Every 12 hours 03/15/21 1302 03/18/21 1427   03/13/21 2200  fluconazole (DIFLUCAN) IVPB 400 mg        400 mg 100 mL/hr over 120 Minutes Intravenous Every 24 hours 03/13/21 1047 03/19/21 0031   03/13/21 1800  acyclovir (ZOVIRAX) 900 mg in dextrose 5 % 150 mL IVPB  Status:  Discontinued        900 mg 168 mL/hr over 60 Minutes Intravenous Every 24 hours 03/13/21 0958 03/13/21 1047   03/13/21 1800  acyclovir (ZOVIRAX) 900 mg in dextrose 5 % 150 mL IVPB   Status:  Discontinued        900 mg 168 mL/hr over 60 Minutes Intravenous Every 12 hours 03/13/21 1047 03/14/21 1700   03/13/21 1000  emtricitabine-tenofovir AF (DESCOVY) 200-25 MG per tablet 1 tablet  Status:  Discontinued        1 tablet Per Tube Daily 03/12/21 1350 03/21/21 1330   03/12/21 2200  vancomycin (VANCOREADY) IVPB 750 mg/150 mL  Status:  Discontinued        750 mg 150 mL/hr over 60 Minutes Intravenous Every 12 hours 03/12/21 1249 03/12/21 1806   03/12/21 2200  clindamycin (CLEOCIN) IVPB 900 mg        900 mg 100 mL/hr over 30 Minutes Intravenous Every 8 hours 03/12/21 1729     03/12/21 1815  primaquine tablet 30 mg  Status:  Discontinued        30 mg Oral Daily 03/12/21 1729 03/17/21 0856   03/12/21  1400  vancomycin (VANCOREADY) IVPB 1000 mg/200 mL        1,000 mg 200 mL/hr over 60 Minutes Intravenous  Once 03/12/21 1249 03/12/21 1540   03/12/21 1400  acyclovir (ZOVIRAX) 900 mg in dextrose 5 % 150 mL IVPB  Status:  Discontinued        900 mg 168 mL/hr over 60 Minutes Intravenous Every 12 hours 03/12/21 1249 03/13/21 0958   03/12/21 1300  cefTRIAXone (ROCEPHIN) 2 g in sodium chloride 0.9 % 100 mL IVPB  Status:  Discontinued        2 g 200 mL/hr over 30 Minutes Intravenous Every 12 hours 03/12/21 1203 03/15/21 1251   03/10/21 2200  fluconazole (DIFLUCAN) IVPB 200 mg  Status:  Discontinued        200 mg 100 mL/hr over 60 Minutes Intravenous Every 24 hours 03/10/21 1333 03/13/21 1047   03/10/21 1800  ceFAZolin (ANCEF) IVPB 2g/100 mL premix  Status:  Discontinued        2 g 200 mL/hr over 30 Minutes Intravenous Every 8 hours 03/10/21 1325 03/12/21 1203   03/09/21 2200  ceFAZolin (ANCEF) IVPB 2g/100 mL premix  Status:  Discontinued        2 g 200 mL/hr over 30 Minutes Intravenous Every 12 hours 03/09/21 1445 03/10/21 1325   03/09/21 2000  ceFAZolin (ANCEF) IVPB 2g/100 mL premix  Status:  Discontinued        2 g 200 mL/hr over 30 Minutes Intravenous Every 8 hours 03/09/21 1440  03/09/21 1445   03/09/21 1645  dapsone tablet 100 mg  Status:  Discontinued        100 mg Per Tube Daily 03/09/21 1556 03/12/21 1729   03/07/21 2200  primaquine tablet 30 mg  Status:  Discontinued        30 mg Per Tube Every 24 hours 03/07/21 0915 03/09/21 1439   03/07/21 1000  dolutegravir (TIVICAY) tablet 50 mg  Status:  Discontinued        50 mg Per Tube Daily 03/06/21 1132 03/23/21 0002   03/07/21 1000  lamiVUDine (EPIVIR) tablet 150 mg  Status:  Discontinued        150 mg Per Tube Daily 03/06/21 1324 03/12/21 1350   03/06/21 2200  fluconazole (DIFLUCAN) IVPB 400 mg  Status:  Discontinued        400 mg 100 mL/hr over 120 Minutes Intravenous Every 24 hours 03/06/21 1251 03/10/21 1333   03/06/21 1400  ceFEPIme (MAXIPIME) 2 g in sodium chloride 0.9 % 100 mL IVPB  Status:  Discontinued        2 g 200 mL/hr over 30 Minutes Intravenous Every 12 hours 03/06/21 1251 03/09/21 1439   03/05/21 2130  fluconazole (DIFLUCAN) IVPB 200 mg  Status:  Discontinued        200 mg 100 mL/hr over 60 Minutes Intravenous Every 24 hours 03/05/21 0810 03/06/21 1251   03/05/21 1000  dolutegravir (TIVICAY) tablet 50 mg  Status:  Discontinued        50 mg Oral Daily 03/04/21 2214 03/06/21 1132   03/05/21 1000  lamiVUDine (EPIVIR) tablet 300 mg  Status:  Discontinued        300 mg Oral Daily 03/04/21 2217 03/04/21 2217   03/05/21 1000  lamiVUDine (EPIVIR) tablet 150 mg  Status:  Discontinued        150 mg Oral Daily 03/04/21 2217 03/06/21 1324   03/04/21 2200  clindamycin (CLEOCIN) IVPB 900 mg  Status:  Discontinued  900 mg 100 mL/hr over 30 Minutes Intravenous Every 8 hours 03/04/21 2157 03/09/21 1439   03/04/21 2200  primaquine tablet 30 mg  Status:  Discontinued        30 mg Oral Every 24 hours 03/04/21 2157 03/07/21 0915   03/04/21 2145  vancomycin (VANCOREADY) IVPB 1500 mg/300 mL        1,500 mg 150 mL/hr over 120 Minutes Intravenous  Once 03/04/21 2144 03/05/21 0050   03/04/21 2145  ceFEPIme  (MAXIPIME) 2 g in sodium chloride 0.9 % 100 mL IVPB  Status:  Discontinued        2 g 200 mL/hr over 30 Minutes Intravenous Every 24 hours 03/04/21 2144 03/06/21 1251   03/04/21 2143  vancomycin variable dose per unstable renal function (pharmacist dosing)  Status:  Discontinued         Does not apply See admin instructions 03/04/21 2144 03/06/21 0813   03/04/21 2130  fluconazole (DIFLUCAN) IVPB 400 mg  Status:  Discontinued        400 mg 100 mL/hr over 120 Minutes Intravenous Every 24 hours 03/04/21 2126 03/05/21 0810   03/04/21 1915  cefTRIAXone (ROCEPHIN) 1 g in sodium chloride 0.9 % 100 mL IVPB        1 g 200 mL/hr over 30 Minutes Intravenous  Once 03/04/21 1914 03/04/21 2045   03/04/21 1915  azithromycin (ZITHROMAX) 500 mg in sodium chloride 0.9 % 250 mL IVPB        500 mg 250 mL/hr over 60 Minutes Intravenous  Once 03/04/21 1914 03/04/21 2201       Medications: Scheduled Meds:  sodium chloride   Intravenous Once   alteplase  2 mg Intracatheter Once   alteplase  2 mg Intracatheter Once   amLODipine  10 mg Oral Daily   chlorhexidine gluconate (MEDLINE KIT)  15 mL Mouth Rinse BID   Chlorhexidine Gluconate Cloth  6 each Topical Daily   clonazePAM  0.5 mg Oral BID   cloNIDine  0.1 mg Oral BID   Followed by   Derrill Memo ON 03/25/2021] cloNIDine  0.1 mg Oral Daily   [START ON 03/28/2021] darbepoetin (ARANESP) injection - NON-DIALYSIS  200 mcg Subcutaneous Q Sun-1800   dolutegravir  50 mg Oral Daily   gabapentin  100 mg Oral Q12H   heparin injection (subcutaneous)  5,000 Units Subcutaneous Q8H   insulin aspart  0-15 Units Subcutaneous TID WC   insulin aspart  0-5 Units Subcutaneous QHS   insulin glargine  5 Units Subcutaneous Daily   ipratropium-albuterol  3 mL Nebulization TID   lamiVUDine  300 mg Oral Daily   lidocaine  1 patch Transdermal Q24H   linezolid  600 mg Oral Q12H   mouth rinse  15 mL Mouth Rinse q12n4p   multivitamin with minerals  1 tablet Oral Daily   oxyCODONE  5 mg  Oral Q8H   predniSONE  20 mg Oral Q breakfast   primaquine  30 mg Oral Daily   sodium chloride flush  10-40 mL Intracatheter Q12H   tamsulosin  0.4 mg Oral Daily   Continuous Infusions:  sodium chloride 250 mL (03/21/21 1408)   sodium chloride Stopped (03/19/21 2158)   clindamycin (CLEOCIN) IV 900 mg (03/24/21 1423)   dextrose 50 mL/hr at 03/23/21 1300   ferric gluconate (FERRLECIT) IVPB 250 mg (03/24/21 0922)   PRN Meds:.acetaminophen, albuterol, docusate sodium, fentaNYL (SUBLIMAZE) injection, heparin, labetalol, lip balm, midazolam, ondansetron (ZOFRAN) IV, polyethylene glycol, sodium chloride flush    Objective:  Weight change:   Intake/Output Summary (Last 24 hours) at 03/24/2021 1524 Last data filed at 03/24/2021 1318 Gross per 24 hour  Intake 240 ml  Output 2475 ml  Net -2235 ml    Blood pressure (!) 146/92, pulse (!) 104, temperature 98.7 F (37.1 C), temperature source Oral, resp. rate (!) 25, height 5' 10.98" (1.803 m), weight 79.6 kg, SpO2 100 %. Temp:  [98.1 F (36.7 C)-99.4 F (37.4 C)] 98.7 F (37.1 C) (07/20 0802) Pulse Rate:  [82-104] 104 (07/20 1500) Resp:  [21-25] 25 (07/20 1500) BP: (138-156)/(84-95) 146/92 (07/20 0809) SpO2:  [98 %-100 %] 100 % (07/20 1500) FiO2 (%):  [28 %] 28 % (07/20 1500)  Physical Exam: Physical Exam Constitutional:      General: He is not in acute distress.    Appearance: He is not ill-appearing or toxic-appearing.  Cardiovascular:     Rate and Rhythm: Normal rate and regular rhythm.  Pulmonary:     Effort: Pulmonary effort is normal.     Breath sounds: Normal breath sounds.     Comments: On Trach collar  Abdominal:     General: Bowel sounds are normal.     Palpations: Abdomen is soft.  Musculoskeletal:     Right lower leg: Edema present.     Comments: Mild edema of bilateral feet  Skin:    General: Skin is warm and dry.  Neurological:     Mental Status: He is alert.       BMET Recent Labs    03/23/21 0437  03/24/21 0955  NA 148* 138  K 4.1 3.9  CL 114* 108  CO2 23 22  GLUCOSE 120* 167*  BUN 102* 71*  CREATININE 2.25* 1.71*  CALCIUM 9.1 8.9      Liver Panel  Recent Labs    03/22/21 0508 03/24/21 0955  PROT 6.8  --   ALBUMIN 1.8* 2.0*  AST 20  --   ALT 13  --   ALKPHOS 63  --   BILITOT 0.6  --       Micro Results: Recent Results (from the past 720 hour(s))  Culture, blood (routine x 2)     Status: None   Collection Time: 03/04/21  6:14 PM   Specimen: BLOOD LEFT FOREARM  Result Value Ref Range Status   Specimen Description   Final    BLOOD LEFT FOREARM Performed at [facility], [place] [address], [place], [place] [address]    Special Requests   Final    BOTTLES DRAWN AEROBIC AND ANAEROBIC Blood Culture adequate volume Performed at [place] [address], [place], [place] [address]    Culture   Final    NO GROWTH 5 DAYS Performed at [facility], [place] [address], [place], [place] [address]    Report Status 03/09/2021 FINAL  Final  Resp Panel by RT-PCR (Flu A&B, Covid) Nasopharyngeal Swab     Status: None   Collection Time: 03/04/21  7:52 PM   Specimen: Nasopharyngeal Swab; Nasopharyngeal(NP) swabs in vial transport medium  Result Value Ref Range Status   SARS Coronavirus 2 by RT PCR NEGATIVE NEGATIVE Final    Comment: (NOTE) SARS-CoV-2 target nucleic acids are NOT DETECTED.  The SARS-CoV-2 RNA is generally detectable in upper respiratory specimens during the acute phase of infection. The lowest concentration of SARS-CoV-2 viral copies this assay can detect is 138 copies/mL. A negative result does not preclude SARS-Cov-2 infection and should not be used as  the sole basis for treatment or other patient management decisions. A negative result may occur with  improper specimen collection/handling, submission of specimen other than nasopharyngeal swab, presence of viral mutation(s) within the areas targeted by this  assay, and inadequate number of viral copies(<138 copies/mL). A negative result must be combined with clinical observations, patient history, and epidemiological information. The expected result is Negative.  Fact Sheet for Patients:  EntrepreneurPulse.com.au  Fact Sheet for Healthcare Providers:  IncredibleEmployment.be  This test is no t yet approved or cleared by the [place] FDA and  has been authorized for detection and/or diagnosis of SARS-CoV-2 by FDA under an Emergency Use Authorization (EUA). This EUA will remain  in effect (meaning this test can be used) for the duration of the COVID-19 declaration under Section 564(b)(1) of the Act, 21 U.S.C.section 360bbb-3(b)(1), unless the authorization is terminated  or revoked sooner.       Influenza A by PCR NEGATIVE NEGATIVE Final   Influenza B by PCR NEGATIVE NEGATIVE Final    Comment: (NOTE) The Xpert Xpress SARS-CoV-2/FLU/RSV plus assay is intended as an aid in the diagnosis of influenza from Nasopharyngeal swab specimens and should not be used as a sole basis for treatment. Nasal washings and aspirates are unacceptable for Xpert Xpress SARS-CoV-2/FLU/RSV testing.  Fact Sheet for Patients: EntrepreneurPulse.com.au  Fact Sheet for Healthcare Providers: IncredibleEmployment.be  This test is not yet approved or cleared by the [place] FDA and has been authorized for detection and/or diagnosis of SARS-CoV-2 by FDA under an Emergency Use Authorization (EUA). This EUA will remain in effect (meaning this test can be used) for the duration of the COVID-19 declaration under Section 564(b)(1) of the Act, 21 U.S.C. section 360bbb-3(b)(1), unless the authorization is terminated or revoked.  Performed at [facility], [place] [address], [place], [place] [address]   MRSA PCR Screening     Status: None   Collection Time: 03/05/21 10:41  AM  Result Value Ref Range Status   MRSA by PCR NEGATIVE NEGATIVE Final    Comment:        The GeneXpert MRSA Assay (FDA approved for NASAL specimens only), is one component of a comprehensive MRSA colonization surveillance program. It is not intended to diagnose MRSA infection nor to guide or monitor treatment for MRSA infections. Performed at [facility], [place] [address], [place], [place] [address]   Respiratory (~20 pathogens) panel by PCR     Status: None   Collection Time: 03/05/21 10:42 AM   Specimen: Nasopharyngeal Swab; Respiratory  Result Value Ref Range Status   Adenovirus NOT DETECTED NOT DETECTED Final   Coronavirus 229E NOT DETECTED NOT DETECTED Final    Comment: (NOTE) The Coronavirus on the Respiratory Panel, DOES NOT test for the novel  Coronavirus (2019 nCoV)    Coronavirus HKU1 NOT DETECTED NOT DETECTED Final   Coronavirus NL63 NOT DETECTED NOT DETECTED Final   Coronavirus OC43 NOT DETECTED NOT DETECTED Final   Metapneumovirus NOT DETECTED NOT DETECTED Final   Rhinovirus / Enterovirus NOT DETECTED NOT DETECTED Final   Influenza A NOT DETECTED NOT DETECTED Final   Influenza B NOT DETECTED NOT DETECTED Final   Parainfluenza Virus 1 NOT DETECTED NOT DETECTED Final   Parainfluenza Virus 2 NOT DETECTED NOT DETECTED Final   Parainfluenza Virus 3 NOT DETECTED NOT DETECTED Final   Parainfluenza Virus 4 NOT DETECTED NOT DETECTED Final   Respiratory Syncytial Virus NOT DETECTED NOT DETECTED Final   Bordetella pertussis NOT  DETECTED NOT DETECTED Final   Bordetella Parapertussis NOT DETECTED NOT DETECTED Final   Chlamydophila pneumoniae NOT DETECTED NOT DETECTED Final   Mycoplasma pneumoniae NOT DETECTED NOT DETECTED Final    Comment: Performed at [facility], [place] [address] [place], [place] [address]  Gastrointestinal Panel by PCR , Stool     Status: None   Collection Time: 03/05/21  6:28 PM   Specimen: Stool  Result Value Ref Range  Status   Campylobacter species NOT DETECTED NOT DETECTED Final   Plesimonas shigelloides NOT DETECTED NOT DETECTED Final   Salmonella species NOT DETECTED NOT DETECTED Final   Yersinia enterocolitica NOT DETECTED NOT DETECTED Final   Vibrio species NOT DETECTED NOT DETECTED Final   Vibrio cholerae NOT DETECTED NOT DETECTED Final   Enteroaggregative E coli (EAEC) NOT DETECTED NOT DETECTED Final   Enteropathogenic E coli (EPEC) NOT DETECTED NOT DETECTED Final   Enterotoxigenic E coli (ETEC) NOT DETECTED NOT DETECTED Final   Shiga like toxin producing E coli (STEC) NOT DETECTED NOT DETECTED Final   Shigella/Enteroinvasive E coli (EIEC) NOT DETECTED NOT DETECTED Final   Cryptosporidium NOT DETECTED NOT DETECTED Final   Cyclospora cayetanensis NOT DETECTED NOT DETECTED Final   Entamoeba histolytica NOT DETECTED NOT DETECTED Final   Giardia lamblia NOT DETECTED NOT DETECTED Final   Adenovirus F40/41 NOT DETECTED NOT DETECTED Final   Astrovirus NOT DETECTED NOT DETECTED Final   Norovirus GI/GII NOT DETECTED NOT DETECTED Final   Rotavirus A NOT DETECTED NOT DETECTED Final   Sapovirus (I, II, IV, and V) NOT DETECTED NOT DETECTED Final    Comment: Performed at [facility], [place] [place], [place] [address]  Pneumocystis smear by DFA     Status: None   Collection Time: 03/06/21  1:09 PM   Specimen: Sputum; Respiratory  Result Value Ref Range Status   Specimen Source-PJSRC SPUTUM  Final   Pneumocystis jiroveci Ag NEGATIVE  Final    Comment: Performed at [place] Performed at [place] [facility] Performed at [facility], [place] [address] [place], [place] [address]   Culture, Respiratory w Gram Stain     Status: None   Collection Time: 03/06/21  1:10 PM   Specimen: Tracheal Aspirate; Respiratory  Result Value Ref Range Status   Specimen Description   Final    TRACHEAL ASPIRATE Performed at [place] [address], [place], [place] [address]    Special Requests   Final    Immunocompromised Performed at [facility], [place] [address] [place], [place] [address]    Gram Stain   Final    FEW WBC PRESENT,BOTH PMN AND MONONUCLEAR RARE SQUAMOUS EPITHELIAL CELLS PRESENT RARE GRAM POSITIVE RODS RARE GRAM POSITIVE COCCI IN PAIRS Performed at [facility], [place] [address], [place], [place] [address]    Culture RARE STAPHYLOCOCCUS AUREUS  Final   Report Status 03/09/2021 FINAL  Final   Organism ID, Bacteria STAPHYLOCOCCUS AUREUS  Final      Susceptibility   Staphylococcus aureus - MIC*    CIPROFLOXACIN <=0.5 SENSITIVE Sensitive     ERYTHROMYCIN <=0.25 SENSITIVE Sensitive     GENTAMICIN <=0.5 SENSITIVE Sensitive     OXACILLIN <=0.25 SENSITIVE Sensitive     TETRACYCLINE >=16 RESISTANT Resistant     VANCOMYCIN <=0.5 SENSITIVE Sensitive     TRIMETH/SULFA <=10 SENSITIVE Sensitive     CLINDAMYCIN <=0.25 SENSITIVE Sensitive  RIFAMPIN <=0.5 SENSITIVE Sensitive     Inducible Clindamycin NEGATIVE Sensitive     * RARE STAPHYLOCOCCUS AUREUS  Blastomyces Antigen     Status: None   Collection Time: 03/12/21  5:55 PM   Specimen: Blood  Result Value Ref Range Status   Blastomyces Antigen None Detected None Detected ng/mL Final    Comment: (NOTE) Results reported as ng/mL in 0.2 - 14.7 ng/mL range Results above the limit of detection but below 0.2 ng/mL are reported as 'Positive, Below the Limit of Quantification' Results above 14.7 ng/mL are reported as 'Positive, Above the Limit of Quantification'    Specimen Type SERUM  Final    Comment: (NOTE) Performed At: [facility] [place], [place] [id number] Bruce Donath MD [id number]   Virus culture     Status: None   Collection Time: 03/12/21  7:08 PM   Specimen: CSF  Result Value Ref Range Status   Viral Culture No virus isolated.  Corrected    Comment: (NOTE) Performed At: [facility] [address] [place], [place] [id number] Rush Farmer MD QX:[id number] CORRECTED ON 07/20 AT 0349: PREVIOUSLY REPORTED AS Comment    Source of Sample BLOOD  Final    Comment: Performed at [place] [address], [place], [place] [address]  Virus culture     Status: None   Collection Time: 03/13/21  4:42 PM   Specimen: PATH Cytology CSF; Cerebrospinal Fluid  Result Value Ref Range Status   Viral Culture No virus isolated.  Corrected    Comment: (NOTE) Please indicate source of specimen on all future request forms. Performed At: [facility] [address] [place], [place] [id number] Rush Farmer MD [id number] CORRECTED ON 07/19 AT 1036: PREVIOUSLY REPORTED AS Comment    Source of Sample CSF  Final    Comment: Performed at [facility], [place] [address], [place], [place] [address]  CSF culture w Gram Stain     Status: None   Collection Time: 03/13/21  4:42 PM   Specimen: PATH Cytology CSF; Cerebrospinal Fluid  Result Value Ref Range Status   Specimen Description   Final    CSF Performed at [place] [address] [place], [place] [address]    Special Requests   Final    NONE Performed at [facility], [place] [address] [place], [place] [address]    Gram Stain   Final    NO ORGANISMS SEEN Gram Stain Report Called to,Read Back By and Verified With: ETHAN RN AT 1975 ON 03/13/21 BY S.VANHOORNE Performed at [facility], [place] [address], [place], [place] [address]    Culture   Final    NO GROWTH 3 DAYS Performed at [facility], [place] [address] [place], [place] [address]    Report Status 03/17/2021 FINAL  Final  Fungus Culture With Stain     Status: Abnormal   Collection Time: 03/13/21  4:42 PM   Specimen: PATH Cytology CSF; Cerebrospinal Fluid  Result Value Ref Range Status   Fungus Stain QNSTST (A)  Final    Comment: (NOTE) Test not  performed. Insufficient specimen to perform or complete analysis.      Darrick Meigs S. was notified 03/17/2021.    Fungus (Mycology) Culture QNSTST (A)  Final    Comment: (NOTE) Test not performed. Insufficient specimen to perform or complete analysis.      Darrick Meigs S. was notified 03/17/2021. Performed At:  [facility] [place] [address] [place], [place] [id number] Rush Farmer MD [id number]    Fungal Source CSF  Final    Comment: Performed at [place] [address], [place], [place] [address]  Culture, fungus without smear     Status: None (Preliminary result)   Collection Time: 03/13/21  4:42 PM   Specimen: PATH Cytology CSF; Cerebrospinal Fluid  Result Value Ref Range Status   Specimen Description   Final    CSF Performed at [place] [address] [place], [place] [address]    Special Requests   Final    NONE Performed at [facility], [place] [address] [place], [place] [address]    Culture   Final    NO FUNGUS ISOLATED AFTER 11 DAYS Performed at [facility], [place] [address], [place], [place] [address]    Report Status PENDING  Incomplete  Culture, Respiratory w Gram Stain     Status: None   Collection Time: 03/14/21  3:15 AM   Specimen: Tracheal Aspirate; Respiratory  Result Value Ref Range Status   Specimen Description   Final    TRACHEAL ASPIRATE Performed at [place] [address] [place], [place] [address]    Special Requests   Final    NONE Performed at [facility], [place] [address] [place], [place] [address]    Gram Stain   Final    MODERATE WBC PRESENT,BOTH PMN AND MONONUCLEAR NO ORGANISMS SEEN    Culture   Final    NO GROWTH 2 DAYS Performed at [facility], [place] [address], [place], [place] [address]    Report Status 03/16/2021 FINAL  Final  Culture, Respiratory w Gram Stain     Status: None   Collection Time: 03/16/21   9:40 AM   Specimen: Bronchoalveolar Lavage; Respiratory  Result Value Ref Range Status   Specimen Description   Final    BRONCHIAL ALVEOLAR LAVAGE Performed at [place] [address] [place], [place] [address]    Special Requests   Final    NONE Performed at [facility], [place] [address] [place], [place] [address]    Gram Stain   Final    FEW WBC PRESENT,BOTH PMN AND MONONUCLEAR NO ORGANISMS SEEN    Culture   Final    RARE Normal respiratory flora-no Staph aureus or Pseudomonas seen Performed at [facility], [address], [place], [place] [address]    Report Status 03/18/2021 FINAL  Final  Fungus Culture With Stain     Status: None (Preliminary result)   Collection Time: 03/16/21  9:40 AM   Specimen: Bronchoalveolar Lavage  Result Value Ref Range Status   Fungus Stain Final report  Final    Comment: (NOTE) Performed At: [facility] [place], [place] [id number] Rush Farmer MD [id number]    Fungus (Mycology) Culture PENDING  Incomplete   Fungal Source BRONCHIAL ALVEOLAR LAVAGE  Final    Comment: Performed at [facility], [place] [address], [place], [place] [address]  Pneumocystis smear by DFA     Status: None   Collection Time: 03/16/21  9:40 AM   Specimen: Bronchoalveolar Lavage; Respiratory  Result Value Ref Range Status   Specimen Source-PJSRC BRONCHIAL ALVEOLAR LAVAGE  Final   Pneumocystis jiroveci Ag NEGATIVE  Final    Comment: Performed at [facility] [address] [place],  [place] [address]  Fungus Culture Result     Status: None   Collection Time: 03/16/21  9:40 AM  Result Value Ref Range Status   Result 1 Comment  Final    Comment: (NOTE) KOH/Calcofluor preparation:  no fungus observed. Performed At: [facility] [place], [place] [id number] Rush Farmer MD [id number]   Fungus Culture With Stain     Status: None  (Preliminary result)   Collection Time: 03/16/21  9:41 AM   Specimen: Bronchoalveolar Lavage  Result Value Ref Range Status   Fungus Stain Final report  Final    Comment: (NOTE) Performed At: [facility] [address], [place] [id number] Rush Farmer MD [id number]    Fungus (Mycology) Culture PENDING  Incomplete   Fungal Source BRONCHIAL ALVEOLAR LAVAGE  Final    Comment: Performed at [facility], [place] [address], [place], [place] [address]  Culture, Respiratory w Gram Stain     Status: None   Collection Time: 03/16/21  9:41 AM   Specimen: Bronchoalveolar Lavage; Respiratory  Result Value Ref Range Status   Specimen Description   Final    BRONCHIAL ALVEOLAR LAVAGE Performed at [facility] [address], [place], [place] [address]    Special Requests   Final    NONE Performed at [facility], [place] [address] [place], [place] [address]    Gram Stain   Final    NO WBC SEEN NO ORGANISMS SEEN Performed at [facility], [place] [address] [place], [place] [address]    Culture RARE STAPHYLOCOCCUS EPIDERMIDIS  Final   Report Status 03/18/2021 FINAL  Final   Organism ID, Bacteria STAPHYLOCOCCUS EPIDERMIDIS  Final      Susceptibility   Staphylococcus epidermidis - MIC*    CIPROFLOXACIN 4 RESISTANT Resistant     ERYTHROMYCIN >=8 RESISTANT Resistant     GENTAMICIN <=0.5 SENSITIVE Sensitive     OXACILLIN >=4 RESISTANT Resistant     TETRACYCLINE >=16 RESISTANT Resistant     VANCOMYCIN 1 SENSITIVE Sensitive     TRIMETH/SULFA 40 SENSITIVE Sensitive     CLINDAMYCIN >=8 RESISTANT Resistant     RIFAMPIN 2 INTERMEDIATE Intermediate     Inducible Clindamycin NEGATIVE Sensitive     * RARE STAPHYLOCOCCUS EPIDERMIDIS  Fungus Culture Result     Status: None   Collection Time: 03/16/21  9:41 AM  Result Value Ref Range Status   Result 1 Comment  Final    Comment: (NOTE) KOH/Calcofluor preparation:  no fungus  observed. Performed At: [facility] [place], [place] [id number] Rush Farmer MD [id number]       Assessment/Plan:  INTERVAL HISTORY: Patient continues to improve clinically. His O2 requirement remains stable. Continues to have good UOP.   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 male with HIV and AIDS. He was admitted with respiratory failure and concern for ARDS. He has been treated  empirically for PCP with clindamycin and primaquine  + prednisone for possible IRIS which he will continue for a total of 21 d. This is despite PCP Ag and PCR being negative though in the setting of treatment, prior to testing and worsening clinical status off of PCP treatment. He has also completed a course of linezolid for MSSA and MRSE PNA.      #1 Hypoxemic Respiratory failure: He is currently saturating at 100% on 5L trach collar. Wean O2 as able. Continue clindamycin, primaquine and prednisone for a total of 21 days.    #2 HIV/AIDS: Responded dramatically to Dovato. Continue Dovato.    #3 Renal failure: Likely due to ATN 2/2 hypotension in the setting of sepsis. Renal function improving. Continue to monitor.     LOS: 20 days   Althea Grimmer 03/24/2021, 3:24 PM

## 2021-03-24 NOTE — Progress Notes (Signed)
Subjective:  moved out of ICU-   had urinary retention -   needed foley to be placed.  Very animated today-  passy muir valve - says he has been drinking a lot of his thickened fluids-  D5 was  stopped-  no labs this AM yet    Objective Vital signs in last 24 hours: Vitals:   03/23/21 1953 03/23/21 2327 03/23/21 2348 03/24/21 0419  BP:  138/90  (!) 156/95  Pulse:  88 92 84  Resp:  (!) 21  (!) 22  Temp:  98.1 F (36.7 C)  98.7 F (37.1 C)  TempSrc:  Oral  Oral  SpO2: 100% 100% 100% 100%  Weight:      Height:       Weight change:   Intake/Output Summary (Last 24 hours) at 03/24/2021 0804 Last data filed at 03/24/2021 0710 Gross per 24 hour  Intake 1204.61 ml  Output 2615 ml  Net -1410.39 ml    Assessment/ Plan: Pt is a 45 y.o. yo male with HIV/AIDS, DM, baseline CKD who was admitted on 03/04/2021 with acute hypoxic resp failure with sepsis and AKI  Assessment/Plan: 1. Renal-  reported baseline crt in the high 2's.  A on CRF in the setting of hypoxic resp failure and sepsis and also urinary retention.  Off and on CRRT-  most recently stopped on 7/15.  Since then making good urine and crt  down today for 2 days in a row.  No absolute indications for dialysis support-  temp HD cath is out.   No labs today, will order so I can follow renal function 2. VDRF-  trach-  on trach collar this AM- per CCM-  zyvox for PNA 3. Anemia- severe- on very low dose aranesp-  have inc-   iron stores low, giving iron as well-  s/p transfusion 7/18   5. Hypernatremia-  has been trending-  needs some free water, no tube-  now is on dysphasia diet but does not seem to like the thickened stuff -  started some d5 yest-   it has been stopped-  maybe  drinking more though-  need to check sodium and act as needed  6.  Urinary retention-  has been an issue this hosp-  giving flomax-  will rechallenge with foley removal at a later date  Bruce Hunter    Labs: Basic Metabolic Panel: Recent Labs  Lab  03/18/21 1600 03/19/21 0428 03/20/21 1026 03/21/21 0451 03/22/21 0508 03/23/21 0437  NA 135 137   < > 136 145 148*  K 4.4 3.9   < > 4.0 4.0 4.1  CL 102 101   < > 103 111 114*  CO2 26 26   < > 21* 23 23  GLUCOSE 182* 215*   < > 235* 185* 120*  BUN 43* 45*   < > 111* 113* 102*  CREATININE 1.69* 1.56*   < > 2.90* 2.56* 2.25*  CALCIUM 8.9 8.9   < > 8.8* 8.9 9.1  PHOS 2.9 2.2*  --  7.3*  --   --    < > = values in this interval not displayed.   Liver Function Tests: Recent Labs  Lab 03/19/21 0428 03/21/21 0451 03/22/21 0508  AST 41  --  20  ALT 13  --  13  ALKPHOS 90  --  63  BILITOT 0.6  --  0.6  PROT 7.5  --  6.8  ALBUMIN 2.1* 1.9* 1.8*   No results  for input(s): LIPASE, AMYLASE in the last 168 hours. No results for input(s): AMMONIA in the last 168 hours. CBC: Recent Labs  Lab 03/19/21 0428 03/20/21 1026 03/21/21 0451 03/22/21 0508 03/22/21 1531 03/23/21 0437  WBC 19.4* 20.3* 18.1* 12.0* 12.3* 8.4  NEUTROABS 14.2*  --   --   --   --   --   HGB 7.5* 7.0* 7.2* 6.5* 8.3* 7.2*  HCT 24.9* 24.0* 24.2* 21.8* 27.4* 25.2*  MCV 98.0 98.4 96.8 97.8 95.5 97.7  PLT 169 171 208 200 234 228   Cardiac Enzymes: No results for input(s): CKTOTAL, CKMB, CKMBINDEX, TROPONINI in the last 168 hours. CBG: Recent Labs  Lab 03/23/21 0308 03/23/21 0713 03/23/21 1138 03/23/21 1710 03/23/21 1915  GLUCAP 99 144* 228* 157* 102*    Iron Studies:  Recent Labs    03/23/21 0437  IRON 18*  TIBC 196*  FERRITIN 877*   Studies/Results: DG Swallowing Func-Speech Pathology  Result Date: 03/22/2021 Formatting of this result is different from the original. Objective Swallowing Evaluation: Type of Study: MBS-Modified Barium Swallow Study  Patient Details Name: Bruce Hunter MRN: [id number] Date of Birth: 05-Sep-1976 Today's Date: 03/22/2021 Time: SLP Start Time (ACUTE ONLY): 1242 -SLP Stop Time (ACUTE ONLY): 7494 SLP Time Calculation (min) (ACUTE ONLY): 10 min Past Medical History: Past  Medical History: Diagnosis Date  Asthma   Diabetes mellitus without complication ([place])   Gout   Hyperlipidemia   Hypertension   Obesity  Past Surgical History: Past Surgical History: Procedure Laterality Date  BIOPSY  02/15/2021  Procedure: BIOPSY;  Surgeon: Ronnette Juniper, MD;  Location: [facility];  Service: Gastroenterology;;  COLONOSCOPY WITH PROPOFOL N/A 02/15/2021  Procedure: COLONOSCOPY WITH PROPOFOL;  Surgeon: Ronnette Juniper, MD;  Location: [facility];  Service: Gastroenterology;  Laterality: N/A;  ESOPHAGOGASTRODUODENOSCOPY (EGD) WITH PROPOFOL N/A 02/15/2021  Procedure: ESOPHAGOGASTRODUODENOSCOPY (EGD) WITH PROPOFOL;  Surgeon: Ronnette Juniper, MD;  Location: [facility];  Service: Gastroenterology;  Laterality: N/A;  GANGLION CYST EXCISION Right 02/26/2020  Procedure: EXCISION TOPHUS RIGHT FOOT;  Surgeon: Evelina Bucy, DPM;  Location: [place];  Service: Podiatry;  Laterality: Right;  NO PAST SURGERIES   HPI: 45 y.o. male with a past medical history significant for HIV/AIDS, CKD, asthma, obesity, DM2 admitted for acute hypoxic respiratory failure with sepsis, requiring ventilator support on 7/1 and tracheostomy 7/13, CRRT.  Subjective: alert, upright in chair for procedure with PMSV in place Assessment / Plan / Recommendation CHL IP CLINICAL IMPRESSIONS 03/22/2021 Clinical Impression Pt presents with a mild oral and mild to moderate pharyngeal dysphagia, likely of multifactorial etiology. PMSV in place during entire duration of procedure; pt tolerating well. Pt with mild oral residuals with all POs, eventually spilling to the valleculae post intitial swallow (cues for second volitional swallows cleared). Piecemeal swallowing noted with solid PO, pt with good mastication and baseline adequate dentition. Pharyngeally, pt with incomplete epiglottic deflection, reduced laryngeal vestibule closure, and diminished sensation. This allowed for laryngeal penetration of thin liquids (PAS-2,3 via cup),  (PAS 3, 8 thins via straw) post swallow silent tracheal aspiration of thins as penetrates progressed down trachea, (PAS-2/3 nectar thick liquids via cup), and (PAS-2 puree during the swallow). Airway protection was intact with soft solid and honey thick liquids. Recommend conservative diet of honey thick liquids and dysphagia 3 (mechanical soft) consistencies with meds in puree (crush larger). Pt must utilize PMSV during all PO consumption. SLP to closely monitor for tolerance. Anticipate with extended time and PMSV usage, pt will be  able to tolerate upgraded POs. SLP Visit Diagnosis Dysphagia, oropharyngeal phase (R13.12) Attention and concentration deficit following -- Frontal lobe and executive function deficit following -- Impact on safety and function Mild aspiration risk;Moderate aspiration risk   CHL IP TREATMENT RECOMMENDATION 03/22/2021 Treatment Recommendations Therapy as outlined in treatment plan below   Prognosis 03/22/2021 Prognosis for Safe Diet Advancement Good Barriers to Reach Goals Time post onset Barriers/Prognosis Comment -- CHL IP DIET RECOMMENDATION 03/22/2021 SLP Diet Recommendations Honey thick liquids;Dysphagia 3 (Mech soft) solids Liquid Administration via Cup Medication Administration Whole meds with puree Compensations Slow rate;Small sips/bites Postural Changes Seated upright at 90 degrees;Remain semi-upright after after feeds/meals (Comment)   CHL IP OTHER RECOMMENDATIONS 03/22/2021 Recommended Consults -- Oral Care Recommendations Oral care BID Other Recommendations Order thickener from pharmacy;Place PMSV during PO intake   CHL IP FOLLOW UP RECOMMENDATIONS 03/22/2021 Follow up Recommendations Other (comment)   CHL IP FREQUENCY AND DURATION 03/22/2021 Speech Therapy Frequency (ACUTE ONLY) min 2x/week Treatment Duration 2 weeks      CHL IP ORAL PHASE 03/22/2021 Oral Phase Impaired Oral - Pudding Teaspoon -- Oral - Pudding Cup -- Oral - Honey Teaspoon -- Oral - Honey Cup Delayed oral  transit;Lingual/palatal residue Oral - Nectar Teaspoon -- Oral - Nectar Cup Lingual/palatal residue;Delayed oral transit Oral - Nectar Straw -- Oral - Thin Teaspoon -- Oral - Thin Cup Lingual/palatal residue;Delayed oral transit Oral - Thin Straw Lingual/palatal residue;Delayed oral transit Oral - Puree Delayed oral transit;Lingual/palatal residue Oral - Mech Soft Lingual/palatal residue;Decreased bolus cohesion;Piecemeal swallowing Oral - Regular -- Oral - Multi-Consistency -- Oral - Pill -- Oral Phase - Comment --  CHL IP PHARYNGEAL PHASE 03/22/2021 Pharyngeal Phase Impaired Pharyngeal- Pudding Teaspoon -- Pharyngeal -- Pharyngeal- Pudding Cup -- Pharyngeal -- Pharyngeal- Honey Teaspoon -- Pharyngeal -- Pharyngeal- Honey Cup Delayed swallow initiation-vallecula;Pharyngeal residue - valleculae Pharyngeal -- Pharyngeal- Nectar Teaspoon -- Pharyngeal -- Pharyngeal- Nectar Cup Delayed swallow initiation-pyriform sinuses;Reduced epiglottic inversion;Penetration/Aspiration during swallow;Pharyngeal residue - valleculae Pharyngeal Material enters airway, remains ABOVE vocal cords then ejected out;Material enters airway, remains ABOVE vocal cords and not ejected out Pharyngeal- Nectar Straw -- Pharyngeal -- Pharyngeal- Thin Teaspoon -- Pharyngeal -- Pharyngeal- Thin Cup Delayed swallow initiation-pyriform sinuses;Reduced epiglottic inversion;Penetration/Aspiration during swallow;Penetration/Apiration after swallow;Pharyngeal residue - valleculae Pharyngeal Material enters airway, remains ABOVE vocal cords then ejected out;Material enters airway, remains ABOVE vocal cords and not ejected out Pharyngeal- Thin Straw Delayed swallow initiation-pyriform sinuses;Reduced epiglottic inversion;Reduced airway/laryngeal closure;Penetration/Apiration after swallow;Penetration/Aspiration during swallow;Pharyngeal residue - valleculae Pharyngeal Material enters airway, remains ABOVE vocal cords and not ejected out;Material enters  airway, passes BELOW cords without attempt by patient to eject out (silent aspiration) Pharyngeal- Puree Delayed swallow initiation-vallecula;Penetration/Aspiration during swallow;Reduced epiglottic inversion;Pharyngeal residue - valleculae Pharyngeal Material enters airway, remains ABOVE vocal cords then ejected out;Material enters airway, remains ABOVE vocal cords and not ejected out Pharyngeal- Mechanical Soft Delayed swallow initiation-vallecula;Pharyngeal residue - valleculae Pharyngeal -- Pharyngeal- Regular -- Pharyngeal -- Pharyngeal- Multi-consistency -- Pharyngeal -- Pharyngeal- Pill -- Pharyngeal -- Pharyngeal Comment --  CHL IP CERVICAL ESOPHAGEAL PHASE 03/22/2021 Cervical Esophageal Phase WFL Pudding Teaspoon -- Pudding Cup -- Honey Teaspoon -- Honey Cup -- Nectar Teaspoon -- Nectar Cup -- Nectar Straw -- Thin Teaspoon -- Thin Cup -- Thin Straw -- Puree -- Mechanical Soft -- Regular -- Multi-consistency -- Pill -- Cervical Esophageal Comment -- Chelsea E Hartness MA, CCC-SLP 03/22/2021, 1:26 PM              Medications: Infusions:  sodium chloride 250 mL (03/21/21  1408)   sodium chloride Stopped (03/19/21 2158)   clindamycin (CLEOCIN) IV 900 mg (03/24/21 0609)   dextrose 50 mL/hr at 03/23/21 1300   ferric gluconate (FERRLECIT) IVPB Stopped (03/23/21 1019)    Scheduled Medications:  sodium chloride   Intravenous Once   alteplase  2 mg Intracatheter Once   alteplase  2 mg Intracatheter Once   amLODipine  10 mg Oral Daily   chlorhexidine gluconate (MEDLINE KIT)  15 mL Mouth Rinse BID   Chlorhexidine Gluconate Cloth  6 each Topical Daily   clonazePAM  0.5 mg Oral BID   cloNIDine  0.1 mg Oral BID   Followed by   Derrill Memo ON 03/25/2021] cloNIDine  0.1 mg Oral Daily   [START ON 03/28/2021] darbepoetin (ARANESP) injection - NON-DIALYSIS  200 mcg Subcutaneous Q Sun-1800   dolutegravir  50 mg Oral Daily   gabapentin  100 mg Oral Q12H   heparin injection (subcutaneous)  5,000 Units Subcutaneous  Q8H   insulin aspart  0-15 Units Subcutaneous TID WC   insulin aspart  0-5 Units Subcutaneous QHS   insulin glargine  5 Units Subcutaneous Daily   ipratropium-albuterol  3 mL Nebulization TID   lamiVUDine  300 mg Oral Daily   lidocaine  1 patch Transdermal Q24H   linezolid  600 mg Oral Q12H   mouth rinse  15 mL Mouth Rinse q12n4p   oxyCODONE  5 mg Oral Q8H   predniSONE  20 mg Oral Q breakfast   primaquine  30 mg Oral Daily   sodium chloride flush  10-40 mL Intracatheter Q12H   tamsulosin  0.4 mg Oral Daily    have reviewed scheduled and prn medications.  Physical Exam: General: alert, very talkative Heart: RRR Lungs: CBS bilat Abdomen: soft, non tender Extremities: min edema if any Dialysis Access: right IJ cath has been removed   03/24/2021,8:04 AM  LOS: 20 days

## 2021-03-24 NOTE — Progress Notes (Signed)
PROGRESS NOTE    Bruce Hunter  PPJ:[id number] DOB: 1976/06/18 DOA: 03/04/2021 PCP: Elwyn Reach, MD   Brief Narrative: Bruce Hunter is a 45 y.o. male with a history of HIV/AIDS, chronic pain, diabetes mellitus type 2 with diabetic neuropathy, esophageal candidiasis, diverticulosis with recent GI bleeding.  Patient presented secondary to acute respiratory failure with hypoxia.  Patient required ICU admission and intubation.  While admitted, patient developed severe AKI requiring initiation of CRRT.  Infectious disease was consulted as well for diagnosis of PCP.  Work-up for possible meningitis was negative.  Patient was transitioned from ETT to tracheostomy and was eventually successfully taken off CRRT.  Admission has also been complicated by recurrent anemia requiring multiple transfusions.   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 Patient required intubation on 7/1 and was transitioned to tracheostomy performed on 7/13.  Patient is currently stable on trach collar.  Patient using Passy-Muir valve for speaking and eating per speech therapy recommendations.  Infectious disease on board and is treating patient empirically for PCP with clindamycin, primaquine, prednisone with recommendations for total 21 days of treatment.  Acute metabolic encephalopathy In setting of infection. Resolved.  AKI on CKD stage  IIIb Secondary to ATN. Patient presented with a creatinine of 5.88 and started on CRRT. CRRT stopped on 7/15. Creatinine continues to improve. -Nephrology recommendations: No dialysis at this time, follow renal function  Chronic pain Substance abuse disorder Patient was managed with Precedex in the ICU which was weaned off. Clonidine taper started. Oxycodone also started for pain management. Oxycodone is scheduled. Patient has fentanyl IV prn. -Will need to transition to oxycodone prn and discontinue fentanyl -Continue Klonopin  Anxiety Patient is on Klonopin as an outpatient -Continue Klonopin  HIV -Continue Dovato  Prediabetes Hemoglobin A1c of 5.9% on February 12, 2021.  Rechecked in July with a hemoglobin A1c of 5.4%.  Started on Lantus 5 units and SSI while in the ICU.  Currently on prednisone for PCP treatment. -Continue Lantus and sliding scale insulin  Anemia of chronic disease Acute anemia Anemia appears to be chronic.  Previous baseline of around 9.  Patient has required a total of 5 units of PRBC.  Transfusion for recurrent anemia. Iron panel consistent with anemia of chronic disease. Iron levels low.  Patient with a history of fecal occult blood positive stool in June 2022; at that time upper endoscopy and colonoscopy were significant for scattered diverticula throughout entire colon, internal hemorrhoids, erythematous mucosa of the fundus, duodenitis and candidiasis. Hemoglobin appears to be trending down again.  -Daily CBC -Transfusion for hemoglobin less than 7  Esophageal candidiasis Seen on EGD in June. Recommendation at that time for Diflucan x7 weeks. Course appears to have completed.  Urinary retention Patient had a foley catheter in the ICU which was removed. Patient developed recurrent urinary retention and foley catheter replaced on 7/20. Flomax started. -Continue foley  catheter and Flomax -Outpatient urology follow-up  Pressure injury Medial/upper sacrum, POA.  Left/right buttocks, unknown if present on admission.   DVT prophylaxis: Heparin subcu Code Status:   Code Status: Full Code Family Communication: Girlfriend at bedside Disposition Plan: Discharge likely to LTAC versus CIR if a candidate.  Will not be a candidate for SNF discharge secondary to new trach for at least 1 month after trach.  At this time, patient is not safe for discharge home.  Anticipate patient will be medically stable for discharge in 1 to 2 days if hemoglobin remains stable and kidney function continues to improve.   Consultants:  PCCM Nephrology Infectious disease  Procedures:    Antimicrobials: Clindamycin IV Dolutegravir Emtricitabine-Tenofovir Lamivudine Linezolid Primaquine Vancomycin IV   Subjective: Patient with a sore throat. Was hoping to have a multivitamin. Some pain in his feet that is chronic.  Objective: Vitals:   03/24/21 0419 03/24/21 0802 03/24/21 0807 03/24/21 0809  BP: (!) 156/95 (!) 142/92  (!) 146/92  Pulse: 84   98  Resp: (!) 22   (!) 22  Temp: 98.7 F (37.1 C) 98.7 F (37.1 C)    TempSrc: Oral Oral    SpO2: 100%  98% 100%  Weight:      Height:        Intake/Output Summary (Last 24 hours) at 03/24/2021 0928 Last data filed at 03/24/2021 0710 Gross per 24 hour  Intake 974.64 ml  Output 2200 ml  Net -1225.36 ml   Filed Weights   03/18/21 0500 03/19/21 0500 03/22/21 0600  Weight: 93.8 kg 89.3 kg 79.6 kg    Examination:  General exam: Appears calm and comfortable and in no acute distress. Conversant Respiratory: Diffuse rhonchi. Respiratory effort normal with no intercostal retractions or use of accessory muscles Cardiovascular: S1 & S2 heard, RRR. No murmurs, rubs, gallops or clicks. 1+ left ankle edema Gastrointestinal: Abdomen is nondistended, soft and nontender. No masses felt. Normal bowel sounds heard Neurologic: No focal neurological deficits Musculoskeletal: No calf tenderness Skin: No cyanosis. No new  rashes Psychiatry: Alert and oriented. Memory intact. Mood & affect appropriate    Data Reviewed: I have personally reviewed following labs and imaging studies  CBC Lab Results  Component Value Date   WBC 8.4 03/23/2021   RBC 2.58 (L) 03/23/2021   HGB 7.2 (L) 03/23/2021   HCT 25.2 (L) 03/23/2021   MCV 97.7 03/23/2021   MCH 27.9 03/23/2021   PLT 228 03/23/2021   MCHC 28.6 (L) 03/23/2021   RDW 21.3 (H) 03/23/2021   LYMPHSABS 2.1 03/19/2021   MONOABS 2.0 (H) 03/19/2021   EOSABS 0.2 03/19/2021   BASOSABS 0.0 67/61/9509     Last metabolic panel Lab Results  Component Value Date   NA 148 (H) 03/23/2021   K 4.1 03/23/2021   CL 114 (H) 03/23/2021   CO2 23 03/23/2021   BUN 102 (H) 03/23/2021   CREATININE 2.25 (H) 03/23/2021   GLUCOSE 120 (H) 03/23/2021   GFRNONAA 36 (L) 03/23/2021   GFRAA >60 05/29/2011   CALCIUM 9.1 03/23/2021   PHOS 7.3 (H) 03/21/2021   PROT 6.8 03/22/2021   ALBUMIN 1.8 (L) 03/22/2021   BILITOT 0.6 03/22/2021   ALKPHOS 63 03/22/2021   AST 20 03/22/2021   ALT 13 03/22/2021   ANIONGAP 11 03/23/2021    CBG (last 3)  Recent Labs    03/23/21 1710 03/23/21 1915 03/24/21 0823  GLUCAP 157* 102* 151*     GFR: Estimated Creatinine Clearance: 44.6 mL/min (  A) (by C-G formula based on SCr of 2.25 mg/dL (H)).  Coagulation Profile: Recent Labs  Lab 03/21/21 0451 03/22/21 1531  INR 1.1 1.1    Recent Results (from the past 240 hour(s))  Culture, Respiratory w Gram Stain     Status: None   Collection Time: 03/16/21  9:40 AM   Specimen: Bronchoalveolar Lavage; Respiratory  Result Value Ref Range Status   Specimen Description   Final    BRONCHIAL ALVEOLAR LAVAGE Performed at [facility] [address] [place], [place] [address]    Special Requests   Final    NONE Performed at [facility], [place] [address], [place], [place] [address]    Gram Stain   Final    FEW WBC PRESENT,BOTH PMN AND MONONUCLEAR NO  ORGANISMS SEEN    Culture   Final    RARE Normal respiratory flora-no Staph aureus or Pseudomonas seen Performed at [facility], [address], [place], [place] [address]    Report Status 03/18/2021 FINAL  Final  Fungus Culture With Stain     Status: None (Preliminary result)   Collection Time: 03/16/21  9:40 AM   Specimen: Bronchoalveolar Lavage  Result Value Ref Range Status   Fungus Stain Final report  Final    Comment: (NOTE) Performed At: [facility] [place], [place] [id number] Rush Farmer MD [id number]    Fungus (Mycology) Culture PENDING  Incomplete   Fungal Source BRONCHIAL ALVEOLAR LAVAGE  Final    Comment: Performed at [facility], [place] [address] [place], [place] [address]  Pneumocystis smear by DFA     Status: None   Collection Time: 03/16/21  9:40 AM   Specimen: Bronchoalveolar Lavage; Respiratory  Result Value Ref Range Status   Specimen Source-PJSRC BRONCHIAL ALVEOLAR LAVAGE  Final   Pneumocystis jiroveci Ag NEGATIVE  Final    Comment: Performed at [facility] [address], [place], [place] [address]  Fungus Culture Result     Status: None   Collection Time: 03/16/21  9:40 AM  Result Value Ref Range Status   Result 1 Comment  Final    Comment: (NOTE) KOH/Calcofluor preparation:  no fungus observed. Performed At: [facility] [place], [place] [id number] Rush Farmer MD [id number]   Fungus Culture With Stain     Status: None (Preliminary result)   Collection Time: 03/16/21  9:41 AM   Specimen: Bronchoalveolar Lavage  Result Value Ref Range Status   Fungus Stain Final report  Final    Comment: (NOTE) Performed At: [facility] [address] [place], [place] [id number] Rush Farmer MD [id number]    Fungus (Mycology) Culture PENDING  Incomplete   Fungal Source BRONCHIAL ALVEOLAR LAVAGE  Final    Comment: Performed at [facility], [place] [address] [place], [place] [address]  Culture, Respiratory w Gram Stain     Status: None   Collection Time: 03/16/21  9:41 AM   Specimen: Bronchoalveolar Lavage; Respiratory  Result Value Ref Range Status   Specimen Description   Final    BRONCHIAL ALVEOLAR LAVAGE Performed at [facility] [address] [place], [place] [address]    Special Requests   Final    NONE Performed at [facility], [place] [address], [place], [place] [address]    Gram Stain   Final    NO WBC SEEN NO ORGANISMS SEEN Performed at [facility], [address], [place], [place] [address]    Culture RARE STAPHYLOCOCCUS EPIDERMIDIS  Final   Report Status 03/18/2021 FINAL  Final   Organism ID, Bacteria STAPHYLOCOCCUS EPIDERMIDIS  Final      Susceptibility   Staphylococcus epidermidis - MIC*    CIPROFLOXACIN 4 RESISTANT Resistant     ERYTHROMYCIN >=8 RESISTANT Resistant     GENTAMICIN <=0.5 SENSITIVE Sensitive     OXACILLIN >=4 RESISTANT Resistant     TETRACYCLINE >=16 RESISTANT Resistant     VANCOMYCIN 1 SENSITIVE Sensitive     TRIMETH/SULFA 40 SENSITIVE Sensitive     CLINDAMYCIN >=8 RESISTANT Resistant     RIFAMPIN 2 INTERMEDIATE Intermediate     Inducible Clindamycin NEGATIVE Sensitive     * RARE STAPHYLOCOCCUS EPIDERMIDIS  Fungus Culture Result     Status: None   Collection Time: 03/16/21  9:41 AM  Result Value Ref Range Status   Result 1 Comment  Final    Comment: (NOTE) KOH/Calcofluor preparation:  no fungus observed. Performed At: [facility] [place], [place] [id number] Rush Farmer MD [id number]         Radiology Studies: DG Swallowing Func-Speech Pathology  Result Date: 03/22/2021 Formatting of this result is different from the original. Objective Swallowing Evaluation: Type of Study: MBS-Modified Barium Swallow Study  Patient Details Name: MACHI WHITTAKER MRN: [id number] Date of Birth:  11/02/1975 Today's Date: 03/22/2021 Time: SLP Start Time (ACUTE ONLY): 1242 -SLP Stop Time (ACUTE ONLY): 1093 SLP Time Calculation (min) (ACUTE ONLY): 10 min Past Medical History: Past Medical History: Diagnosis Date  Asthma   Diabetes mellitus without complication ([place])   Gout   Hyperlipidemia   Hypertension   Obesity  Past Surgical History: Past Surgical History: Procedure Laterality Date  BIOPSY  02/15/2021  Procedure: BIOPSY;  Surgeon: Ronnette Juniper, MD;  Location: [facility];  Service: Gastroenterology;;  COLONOSCOPY WITH PROPOFOL N/A 02/15/2021  Procedure: COLONOSCOPY WITH PROPOFOL;  Surgeon: Ronnette Juniper, MD;  Location: [facility];  Service: Gastroenterology;  Laterality: N/A;  ESOPHAGOGASTRODUODENOSCOPY (EGD) WITH PROPOFOL N/A 02/15/2021  Procedure: ESOPHAGOGASTRODUODENOSCOPY (EGD) WITH PROPOFOL;  Surgeon: Ronnette Juniper, MD;  Location: [facility];  Service: Gastroenterology;  Laterality: N/A;  GANGLION CYST EXCISION Right 02/26/2020  Procedure: EXCISION TOPHUS RIGHT FOOT;  Surgeon: Evelina Bucy, DPM;  Location: [facility];  Service: Podiatry;  Laterality: Right;  NO PAST SURGERIES   HPI: 45 y.o. male with a past medical history significant for HIV/AIDS, CKD, asthma, obesity, DM2 admitted for acute hypoxic respiratory failure with sepsis, requiring ventilator support on 7/1 and tracheostomy 7/13, CRRT.  Subjective: alert, upright in chair for procedure with PMSV in place Assessment / Plan / Recommendation CHL IP CLINICAL IMPRESSIONS 03/22/2021 Clinical Impression Pt presents with a mild oral and mild to moderate pharyngeal dysphagia, likely of multifactorial etiology. PMSV in place during entire duration of procedure; pt tolerating well. Pt with mild oral residuals with all POs, eventually spilling to the valleculae post intitial swallow (cues for second volitional swallows cleared). Piecemeal swallowing noted with solid PO, pt with good mastication and baseline adequate dentition. Pharyngeally,  pt with incomplete epiglottic deflection, reduced laryngeal vestibule closure, and diminished sensation. This allowed for laryngeal penetration of thin liquids (PAS-2,3 via cup), (PAS 3, 8 thins via straw) post swallow silent tracheal aspiration of thins as penetrates progressed down trachea, (PAS-2/3 nectar thick liquids via cup), and (PAS-2 puree during the swallow). Airway protection was intact with soft solid and honey thick liquids. Recommend  conservative diet of honey thick liquids and dysphagia 3 (mechanical soft) consistencies with meds in puree (crush larger). Pt must utilize PMSV during all PO consumption. SLP to closely monitor for tolerance. Anticipate with extended time and PMSV usage, pt will be able to tolerate upgraded POs. SLP Visit Diagnosis Dysphagia, oropharyngeal phase (R13.12) Attention and concentration deficit following -- Frontal lobe and executive function deficit following -- Impact on safety and function Mild aspiration risk;Moderate aspiration risk   CHL IP TREATMENT RECOMMENDATION 03/22/2021 Treatment Recommendations Therapy as outlined in treatment plan below   Prognosis 03/22/2021 Prognosis for Safe Diet Advancement Good Barriers to Reach Goals Time post onset Barriers/Prognosis Comment -- CHL IP DIET RECOMMENDATION 03/22/2021 SLP Diet Recommendations Honey thick liquids;Dysphagia 3 (Mech soft) solids Liquid Administration via Cup Medication Administration Whole meds with puree Compensations Slow rate;Small sips/bites Postural Changes Seated upright at 90 degrees;Remain semi-upright after after feeds/meals (Comment)   CHL IP OTHER RECOMMENDATIONS 03/22/2021 Recommended Consults -- Oral Care Recommendations Oral care BID Other Recommendations Order thickener from pharmacy;Place PMSV during PO intake   CHL IP FOLLOW UP RECOMMENDATIONS 03/22/2021 Follow up Recommendations Other (comment)   CHL IP FREQUENCY AND DURATION 03/22/2021 Speech Therapy Frequency (ACUTE ONLY) min 2x/week Treatment  Duration 2 weeks      CHL IP ORAL PHASE 03/22/2021 Oral Phase Impaired Oral - Pudding Teaspoon -- Oral - Pudding Cup -- Oral - Honey Teaspoon -- Oral - Honey Cup Delayed oral transit;Lingual/palatal residue Oral - Nectar Teaspoon -- Oral - Nectar Cup Lingual/palatal residue;Delayed oral transit Oral - Nectar Straw -- Oral - Thin Teaspoon -- Oral - Thin Cup Lingual/palatal residue;Delayed oral transit Oral - Thin Straw Lingual/palatal residue;Delayed oral transit Oral - Puree Delayed oral transit;Lingual/palatal residue Oral - Mech Soft Lingual/palatal residue;Decreased bolus cohesion;Piecemeal swallowing Oral - Regular -- Oral - Multi-Consistency -- Oral - Pill -- Oral Phase - Comment --  CHL IP PHARYNGEAL PHASE 03/22/2021 Pharyngeal Phase Impaired Pharyngeal- Pudding Teaspoon -- Pharyngeal -- Pharyngeal- Pudding Cup -- Pharyngeal -- Pharyngeal- Honey Teaspoon -- Pharyngeal -- Pharyngeal- Honey Cup Delayed swallow initiation-vallecula;Pharyngeal residue - valleculae Pharyngeal -- Pharyngeal- Nectar Teaspoon -- Pharyngeal -- Pharyngeal- Nectar Cup Delayed swallow initiation-pyriform sinuses;Reduced epiglottic inversion;Penetration/Aspiration during swallow;Pharyngeal residue - valleculae Pharyngeal Material enters airway, remains ABOVE vocal cords then ejected out;Material enters airway, remains ABOVE vocal cords and not ejected out Pharyngeal- Nectar Straw -- Pharyngeal -- Pharyngeal- Thin Teaspoon -- Pharyngeal -- Pharyngeal- Thin Cup Delayed swallow initiation-pyriform sinuses;Reduced epiglottic inversion;Penetration/Aspiration during swallow;Penetration/Apiration after swallow;Pharyngeal residue - valleculae Pharyngeal Material enters airway, remains ABOVE vocal cords then ejected out;Material enters airway, remains ABOVE vocal cords and not ejected out Pharyngeal- Thin Straw Delayed swallow initiation-pyriform sinuses;Reduced epiglottic inversion;Reduced airway/laryngeal closure;Penetration/Apiration after  swallow;Penetration/Aspiration during swallow;Pharyngeal residue - valleculae Pharyngeal Material enters airway, remains ABOVE vocal cords and not ejected out;Material enters airway, passes BELOW cords without attempt by patient to eject out (silent aspiration) Pharyngeal- Puree Delayed swallow initiation-vallecula;Penetration/Aspiration during swallow;Reduced epiglottic inversion;Pharyngeal residue - valleculae Pharyngeal Material enters airway, remains ABOVE vocal cords then ejected out;Material enters airway, remains ABOVE vocal cords and not ejected out Pharyngeal- Mechanical Soft Delayed swallow initiation-vallecula;Pharyngeal residue - valleculae Pharyngeal -- Pharyngeal- Regular -- Pharyngeal -- Pharyngeal- Multi-consistency -- Pharyngeal -- Pharyngeal- Pill -- Pharyngeal -- Pharyngeal Comment --  CHL IP CERVICAL ESOPHAGEAL PHASE 03/22/2021 Cervical Esophageal Phase WFL Pudding Teaspoon -- Pudding Cup -- Honey Teaspoon -- Honey Cup -- Nectar Teaspoon -- Nectar Cup -- Nectar Straw -- Thin Teaspoon -- Thin Cup -- Thin Straw -- Puree --  Mechanical Soft -- Regular -- Multi-consistency -- Pill -- Cervical Esophageal Comment -- Chelsea E Hartness MA, CCC-SLP 03/22/2021, 1:26 PM                   Scheduled Meds:  sodium chloride   Intravenous Once   alteplase  2 mg Intracatheter Once   alteplase  2 mg Intracatheter Once   amLODipine  10 mg Oral Daily   chlorhexidine gluconate (MEDLINE KIT)  15 mL Mouth Rinse BID   Chlorhexidine Gluconate Cloth  6 each Topical Daily   clonazePAM  0.5 mg Oral BID   cloNIDine  0.1 mg Oral BID   Followed by   Derrill Memo ON 03/25/2021] cloNIDine  0.1 mg Oral Daily   [START ON 03/28/2021] darbepoetin (ARANESP) injection - NON-DIALYSIS  200 mcg Subcutaneous Q Sun-1800   dolutegravir  50 mg Oral Daily   gabapentin  100 mg Oral Q12H   heparin injection (subcutaneous)  5,000 Units Subcutaneous Q8H   insulin aspart  0-15 Units Subcutaneous TID WC   insulin aspart  0-5 Units  Subcutaneous QHS   insulin glargine  5 Units Subcutaneous Daily   ipratropium-albuterol  3 mL Nebulization TID   lamiVUDine  300 mg Oral Daily   lidocaine  1 patch Transdermal Q24H   linezolid  600 mg Oral Q12H   mouth rinse  15 mL Mouth Rinse q12n4p   multivitamin with minerals  1 tablet Oral Daily   oxyCODONE  5 mg Oral Q8H   predniSONE  20 mg Oral Q breakfast   primaquine  30 mg Oral Daily   sodium chloride flush  10-40 mL Intracatheter Q12H   tamsulosin  0.4 mg Oral Daily   Continuous Infusions:  sodium chloride 250 mL (03/21/21 1408)   sodium chloride Stopped (03/19/21 2158)   clindamycin (CLEOCIN) IV 900 mg (03/24/21 0609)   dextrose 50 mL/hr at 03/23/21 1300   ferric gluconate (FERRLECIT) IVPB 250 mg (03/24/21 0922)     LOS: 20 days     Cordelia Poche, MD [facility] 03/24/2021, 9:28 AM  If 7PM-7AM, please contact night-coverage [email or web address]

## 2021-03-24 NOTE — Plan of Care (Signed)
  Problem: Nutrition: Goal: Adequate nutrition will be maintained Outcome: Progressing   Problem: Elimination: Goal: Will not experience complications related to bowel motility Outcome: Progressing   Problem: Elimination: Goal: Will not experience complications related to urinary retention Outcome: Progressing   Problem: Pain Managment: Goal: General experience of comfort will improve Outcome: Progressing   Problem: Safety: Goal: Ability to remain free from injury will improve Outcome: Progressing

## 2021-03-25 DIAGNOSIS — J9601 Acute respiratory failure with hypoxia: Secondary | ICD-10-CM | POA: Diagnosis not present

## 2021-03-25 DIAGNOSIS — G9341 Metabolic encephalopathy: Secondary | ICD-10-CM | POA: Diagnosis not present

## 2021-03-25 DIAGNOSIS — N179 Acute kidney failure, unspecified: Secondary | ICD-10-CM | POA: Diagnosis not present

## 2021-03-25 DIAGNOSIS — J189 Pneumonia, unspecified organism: Secondary | ICD-10-CM | POA: Diagnosis not present

## 2021-03-25 DIAGNOSIS — B20 Human immunodeficiency virus [HIV] disease: Secondary | ICD-10-CM | POA: Diagnosis not present

## 2021-03-25 LAB — RENAL FUNCTION PANEL
Albumin: 2.1 g/dL — ABNORMAL LOW (ref 3.5–5.0)
Anion gap: 10 (ref 5–15)
BUN: 55 mg/dL — ABNORMAL HIGH (ref 6–20)
CO2: 20 mmol/L — ABNORMAL LOW (ref 22–32)
Calcium: 9 mg/dL (ref 8.9–10.3)
Chloride: 108 mmol/L (ref 98–111)
Creatinine, Ser: 1.54 mg/dL — ABNORMAL HIGH (ref 0.61–1.24)
GFR, Estimated: 57 mL/min — ABNORMAL LOW (ref >60)
Glucose, Bld: 103 mg/dL — ABNORMAL HIGH (ref 70–99)
Phosphorus: 5 mg/dL — ABNORMAL HIGH (ref 2.5–4.6)
Potassium: 3.8 mmol/L (ref 3.5–5.1)
Sodium: 138 mmol/L (ref 135–145)

## 2021-03-25 LAB — GLUCOSE, CAPILLARY
Glucose-Capillary: 117 mg/dL — ABNORMAL HIGH (ref 70–99)
Glucose-Capillary: 151 mg/dL — ABNORMAL HIGH (ref 70–99)
Glucose-Capillary: 192 mg/dL — ABNORMAL HIGH (ref 70–99)
Glucose-Capillary: 140 mg/dL — ABNORMAL HIGH (ref 70–99)

## 2021-03-25 LAB — CBC
HCT: 27.5 % — ABNORMAL LOW (ref 39.0–52.0)
Hemoglobin: 8.5 g/dL — ABNORMAL LOW (ref 13.0–17.0)
MCH: 29 pg (ref 26.0–34.0)
MCHC: 30.9 g/dL (ref 30.0–36.0)
MCV: 93.9 fL (ref 80.0–100.0)
Platelets: 272 10*3/uL (ref 150–400)
RBC: 2.93 MIL/uL — ABNORMAL LOW (ref 4.22–5.81)
RDW: 19.8 % — ABNORMAL HIGH (ref 11.5–15.5)
WBC: 10 10*3/uL (ref 4.0–10.5)
nRBC: 0 % (ref 0.0–0.2)

## 2021-03-25 MED ORDER — OXYCODONE HCL 5 MG PO TABS
5.0000 mg | ORAL_TABLET | Freq: Four times a day (QID) | ORAL | Status: DC | PRN
  Administered 2021-03-25 – 2021-03-28 (×8): 5 mg via ORAL
  Filled 2021-03-25 (×9): qty 1

## 2021-03-25 NOTE — Progress Notes (Signed)
Inpatient Rehab Admissions Coordinator Note:   Per therapy recommendations, pt was screened for CIR candidacy by Shann Medal, PT, DPT.  At this time we are recommending a CIR consult and I will place an order per our protocol.  Please contact me with questions.   Shann Medal, PT, DPT [phone] 03/25/21 4:01 PM

## 2021-03-25 NOTE — Progress Notes (Signed)
Subjective: C/od pain in feet   Antibiotics:  Anti-infectives (From admission, onward)    Start     Dose/Rate Route Frequency Ordered Stop   03/23/21 1000  linezolid (ZYVOX) tablet 600 mg        600 mg Oral Every 12 hours 03/23/21 0002 03/24/21 2151   03/23/21 1000  primaquine tablet 30 mg        30 mg Oral Daily 03/23/21 0002     03/23/21 1000  dolutegravir (TIVICAY) tablet 50 mg        50 mg Oral Daily 03/23/21 0002     03/22/21 1545  lamiVUDine (EPIVIR) tablet 300 mg        300 mg Oral Daily 03/22/21 1446     03/21/21 1430  lamiVUDine (EPIVIR) 10 MG/ML solution 300 mg  Status:  Discontinued        300 mg Per Tube Daily 03/21/21 1330 03/22/21 1446   03/18/21 1530  linezolid (ZYVOX) tablet 600 mg  Status:  Discontinued        600 mg Per Tube Every 12 hours 03/18/21 1431 03/23/21 0002   03/18/21 1515  linezolid (ZYVOX) 100 MG/5ML suspension 600 mg  Status:  Discontinued        600 mg Per Tube Every 12 hours 03/18/21 1427 03/18/21 1431   03/17/21 1000  primaquine tablet 30 mg  Status:  Discontinued        30 mg Per Tube Daily 03/17/21 0856 03/23/21 0002   03/16/21 1000  ceFAZolin (ANCEF) IVPB 1 g/50 mL premix  Status:  Discontinued        1 g 100 mL/hr over 30 Minutes Intravenous Every 12 hours 03/15/21 1251 03/15/21 1301   03/16/21 1000  ceFAZolin (ANCEF) 2 g in sodium chloride 0.9 % 100 mL IVPB  Status:  Discontinued        2 g 200 mL/hr over 30 Minutes Intravenous Every 12 hours 03/15/21 1302 03/18/21 1427   03/13/21 2200  fluconazole (DIFLUCAN) IVPB 400 mg        400 mg 100 mL/hr over 120 Minutes Intravenous Every 24 hours 03/13/21 1047 03/19/21 0031   03/13/21 1800  acyclovir (ZOVIRAX) 900 mg in dextrose 5 % 150 mL IVPB  Status:  Discontinued        900 mg 168 mL/hr over 60 Minutes Intravenous Every 24 hours 03/13/21 0958 03/13/21 1047   03/13/21 1800  acyclovir (ZOVIRAX) 900 mg in dextrose 5 % 150 mL IVPB  Status:  Discontinued        900 mg 168 mL/hr over 60  Minutes Intravenous Every 12 hours 03/13/21 1047 03/14/21 1700   03/13/21 1000  emtricitabine-tenofovir AF (DESCOVY) 200-25 MG per tablet 1 tablet  Status:  Discontinued        1 tablet Per Tube Daily 03/12/21 1350 03/21/21 1330   03/12/21 2200  vancomycin (VANCOREADY) IVPB 750 mg/150 mL  Status:  Discontinued        750 mg 150 mL/hr over 60 Minutes Intravenous Every 12 hours 03/12/21 1249 03/12/21 1806   03/12/21 2200  clindamycin (CLEOCIN) IVPB 900 mg        900 mg 100 mL/hr over 30 Minutes Intravenous Every 8 hours 03/12/21 1729     03/12/21 1815  primaquine tablet 30 mg  Status:  Discontinued        30 mg Oral Daily 03/12/21 1729 03/17/21 0856   03/12/21 1400  vancomycin (VANCOREADY) IVPB 1000 mg/200 mL  1,000 mg 200 mL/hr over 60 Minutes Intravenous  Once 03/12/21 1249 03/12/21 1540   03/12/21 1400  acyclovir (ZOVIRAX) 900 mg in dextrose 5 % 150 mL IVPB  Status:  Discontinued        900 mg 168 mL/hr over 60 Minutes Intravenous Every 12 hours 03/12/21 1249 03/13/21 0958   03/12/21 1300  cefTRIAXone (ROCEPHIN) 2 g in sodium chloride 0.9 % 100 mL IVPB  Status:  Discontinued        2 g 200 mL/hr over 30 Minutes Intravenous Every 12 hours 03/12/21 1203 03/15/21 1251   03/10/21 2200  fluconazole (DIFLUCAN) IVPB 200 mg  Status:  Discontinued        200 mg 100 mL/hr over 60 Minutes Intravenous Every 24 hours 03/10/21 1333 03/13/21 1047   03/10/21 1800  ceFAZolin (ANCEF) IVPB 2g/100 mL premix  Status:  Discontinued        2 g 200 mL/hr over 30 Minutes Intravenous Every 8 hours 03/10/21 1325 03/12/21 1203   03/09/21 2200  ceFAZolin (ANCEF) IVPB 2g/100 mL premix  Status:  Discontinued        2 g 200 mL/hr over 30 Minutes Intravenous Every 12 hours 03/09/21 1445 03/10/21 1325   03/09/21 2000  ceFAZolin (ANCEF) IVPB 2g/100 mL premix  Status:  Discontinued        2 g 200 mL/hr over 30 Minutes Intravenous Every 8 hours 03/09/21 1440 03/09/21 1445   03/09/21 1645  dapsone tablet 100 mg   Status:  Discontinued        100 mg Per Tube Daily 03/09/21 1556 03/12/21 1729   03/07/21 2200  primaquine tablet 30 mg  Status:  Discontinued        30 mg Per Tube Every 24 hours 03/07/21 0915 03/09/21 1439   03/07/21 1000  dolutegravir (TIVICAY) tablet 50 mg  Status:  Discontinued        50 mg Per Tube Daily 03/06/21 1132 03/23/21 0002   03/07/21 1000  lamiVUDine (EPIVIR) tablet 150 mg  Status:  Discontinued        150 mg Per Tube Daily 03/06/21 1324 03/12/21 1350   03/06/21 2200  fluconazole (DIFLUCAN) IVPB 400 mg  Status:  Discontinued        400 mg 100 mL/hr over 120 Minutes Intravenous Every 24 hours 03/06/21 1251 03/10/21 1333   03/06/21 1400  ceFEPIme (MAXIPIME) 2 g in sodium chloride 0.9 % 100 mL IVPB  Status:  Discontinued        2 g 200 mL/hr over 30 Minutes Intravenous Every 12 hours 03/06/21 1251 03/09/21 1439   03/05/21 2130  fluconazole (DIFLUCAN) IVPB 200 mg  Status:  Discontinued        200 mg 100 mL/hr over 60 Minutes Intravenous Every 24 hours 03/05/21 0810 03/06/21 1251   03/05/21 1000  dolutegravir (TIVICAY) tablet 50 mg  Status:  Discontinued        50 mg Oral Daily 03/04/21 2214 03/06/21 1132   03/05/21 1000  lamiVUDine (EPIVIR) tablet 300 mg  Status:  Discontinued        300 mg Oral Daily 03/04/21 2217 03/04/21 2217   03/05/21 1000  lamiVUDine (EPIVIR) tablet 150 mg  Status:  Discontinued        150 mg Oral Daily 03/04/21 2217 03/06/21 1324   03/04/21 2200  clindamycin (CLEOCIN) IVPB 900 mg  Status:  Discontinued        900 mg 100 mL/hr over 30 Minutes Intravenous Every 8  hours 03/04/21 2157 03/09/21 1439   03/04/21 2200  primaquine tablet 30 mg  Status:  Discontinued        30 mg Oral Every 24 hours 03/04/21 2157 03/07/21 0915   03/04/21 2145  vancomycin (VANCOREADY) IVPB 1500 mg/300 mL        1,500 mg 150 mL/hr over 120 Minutes Intravenous  Once 03/04/21 2144 03/05/21 0050   03/04/21 2145  ceFEPIme (MAXIPIME) 2 g in sodium chloride 0.9 % 100 mL IVPB  Status:   Discontinued        2 g 200 mL/hr over 30 Minutes Intravenous Every 24 hours 03/04/21 2144 03/06/21 1251   03/04/21 2143  vancomycin variable dose per unstable renal function (pharmacist dosing)  Status:  Discontinued         Does not apply See admin instructions 03/04/21 2144 03/06/21 0813   03/04/21 2130  fluconazole (DIFLUCAN) IVPB 400 mg  Status:  Discontinued        400 mg 100 mL/hr over 120 Minutes Intravenous Every 24 hours 03/04/21 2126 03/05/21 0810   03/04/21 1915  cefTRIAXone (ROCEPHIN) 1 g in sodium chloride 0.9 % 100 mL IVPB        1 g 200 mL/hr over 30 Minutes Intravenous  Once 03/04/21 1914 03/04/21 2045   03/04/21 1915  azithromycin (ZITHROMAX) 500 mg in sodium chloride 0.9 % 250 mL IVPB        500 mg 250 mL/hr over 60 Minutes Intravenous  Once 03/04/21 1914 03/04/21 2201       Medications: Scheduled Meds:  amLODipine  10 mg Oral Daily   chlorhexidine gluconate (MEDLINE KIT)  15 mL Mouth Rinse BID   Chlorhexidine Gluconate Cloth  6 each Topical Daily   clonazePAM  0.5 mg Oral BID   [START ON 03/28/2021] darbepoetin (ARANESP) injection - NON-DIALYSIS  200 mcg Subcutaneous Q Sun-1800   dolutegravir  50 mg Oral Daily   gabapentin  100 mg Oral Q12H   heparin injection (subcutaneous)  5,000 Units Subcutaneous Q8H   insulin aspart  0-15 Units Subcutaneous TID WC   insulin aspart  0-5 Units Subcutaneous QHS   insulin glargine  5 Units Subcutaneous Daily   ipratropium-albuterol  3 mL Nebulization TID   lamiVUDine  300 mg Oral Daily   lidocaine  1 patch Transdermal Q24H   mouth rinse  15 mL Mouth Rinse q12n4p   multivitamin with minerals  1 tablet Oral Daily   predniSONE  20 mg Oral Q breakfast   primaquine  30 mg Oral Daily   sodium chloride flush  10-40 mL Intracatheter Q12H   Continuous Infusions:  sodium chloride 250 mL (03/21/21 1408)   sodium chloride 250 mL (03/25/21 0345)   clindamycin (CLEOCIN) IV 900 mg (03/25/21 1503)   dextrose 50 mL/hr at 03/23/21 1300    ferric gluconate (FERRLECIT) IVPB 250 mg (03/25/21 1101)   PRN Meds:.acetaminophen, albuterol, docusate sodium, heparin, labetalol, lip balm, midazolam, ondansetron (ZOFRAN) IV, oxyCODONE, polyethylene glycol, sodium chloride flush    Objective: Weight change:   Intake/Output Summary (Last 24 hours) at 03/25/2021 1736 Last data filed at 03/25/2021 1503 Gross per 24 hour  Intake --  Output 3225 ml  Net -3225 ml   Blood pressure 136/77, pulse (!) 102, temperature 98.4 F (36.9 C), temperature source Oral, resp. rate 18, height 5' 10.98" (1.803 m), weight 79.6 kg, SpO2 98 %. Temp:  [98 F (36.7 C)-99 F (37.2 C)] 98.4 F (36.9 C) (07/21 1618) Pulse Rate:  [72-536]  102 (07/21 1625) Resp:  [15-22] 18 (07/21 1625) BP: (108-163)/(63-98) 136/77 (07/21 1618) SpO2:  [97 %-100 %] 98 % (07/21 1625) FiO2 (%):  [28 %] 28 % (07/21 1625)  Physical Exam: Physical Exam Constitutional:      Appearance: He is well-developed.  HENT:     Head: Normocephalic and atraumatic.  Eyes:     General: No scleral icterus.       Right eye: No discharge.        Left eye: No discharge.     Conjunctiva/sclera: Conjunctivae normal.  Neck:     Trachea: Tracheostomy present.  Cardiovascular:     Rate and Rhythm: Normal rate and regular rhythm.     Heart sounds: No murmur heard.   No friction rub. No gallop.  Pulmonary:     Effort: Pulmonary effort is normal. No respiratory distress.     Breath sounds: No stridor. Rhonchi present. No wheezing.  Abdominal:     General: There is no distension.     Palpations: Abdomen is soft.  Musculoskeletal:     Cervical back: Normal range of motion and neck supple.     Right foot: Tenderness present.     Left foot: Tenderness present.  Skin:    General: Skin is warm and dry.  Neurological:     General: No focal deficit present.     Mental Status: He is alert and oriented to person, place, and time.  Psychiatric:        Mood and Affect: Mood normal.         Behavior: Behavior normal.        Thought Content: Thought content normal.        Judgment: Judgment normal.     CBC:    BMET Recent Labs    03/24/21 0955 03/25/21 0148  NA 138 138  K 3.9 3.8  CL 108 108  CO2 22 20*  GLUCOSE 167* 103*  BUN 71* 55*  CREATININE 1.71* 1.54*  CALCIUM 8.9 9.0     Liver Panel  Recent Labs    03/24/21 0955 03/25/21 0148  ALBUMIN 2.0* 2.1*       Sedimentation Rate No results for input(s): ESRSEDRATE in the last 72 hours. C-Reactive Protein No results for input(s): CRP in the last 72 hours.  Micro Results: Recent Results (from the past 720 hour(s))  Culture, blood (routine x 2)     Status: None   Collection Time: 03/04/21  6:14 PM   Specimen: BLOOD LEFT FOREARM  Result Value Ref Range Status   Specimen Description   Final    BLOOD LEFT FOREARM Performed at [facility] [address], [place], [place] [address]    Special Requests   Final    BOTTLES DRAWN AEROBIC AND ANAEROBIC Blood Culture adequate volume Performed at [facility] [address], [place], [place] [address]    Culture   Final    NO GROWTH 5 DAYS Performed at [facility], [place] [address] [place], [place] [address]    Report Status 03/09/2021 FINAL  Final  Resp Panel by RT-PCR (Flu A&B, Covid) Nasopharyngeal Swab     Status: None   Collection Time: 03/04/21  7:52 PM   Specimen: Nasopharyngeal Swab; Nasopharyngeal(NP) swabs in vial transport medium  Result Value Ref Range Status   SARS Coronavirus 2 by RT PCR NEGATIVE NEGATIVE Final    Comment: (NOTE) SARS-CoV-2 target nucleic acids are NOT DETECTED.  The SARS-CoV-2 RNA  is generally detectable in upper respiratory specimens during the acute phase of infection. The lowest concentration of SARS-CoV-2 viral copies this assay can detect is 138 copies/mL. A negative result does not preclude SARS-Cov-2 infection and should not be used as the sole basis for treatment  or other patient management decisions. A negative result may occur with  improper specimen collection/handling, submission of specimen other than nasopharyngeal swab, presence of viral mutation(s) within the areas targeted by this assay, and inadequate number of viral copies(<138 copies/mL). A negative result must be combined with clinical observations, patient history, and epidemiological information. The expected result is Negative.  Fact Sheet for Patients:  EntrepreneurPulse.com.au  Fact Sheet for Healthcare Providers:  IncredibleEmployment.be  This test is no t yet approved or cleared by the [place] FDA and  has been authorized for detection and/or diagnosis of SARS-CoV-2 by FDA under an Emergency Use Authorization (EUA). This EUA will remain  in effect (meaning this test can be used) for the duration of the COVID-19 declaration under Section 564(b)(1) of the Act, 21 U.S.C.section 360bbb-3(b)(1), unless the authorization is terminated  or revoked sooner.       Influenza A by PCR NEGATIVE NEGATIVE Final   Influenza B by PCR NEGATIVE NEGATIVE Final    Comment: (NOTE) The Xpert Xpress SARS-CoV-2/FLU/RSV plus assay is intended as an aid in the diagnosis of influenza from Nasopharyngeal swab specimens and should not be used as a sole basis for treatment. Nasal washings and aspirates are unacceptable for Xpert Xpress SARS-CoV-2/FLU/RSV testing.  Fact Sheet for Patients: EntrepreneurPulse.com.au  Fact Sheet for Healthcare Providers: IncredibleEmployment.be  This test is not yet approved or cleared by the [place] FDA and has been authorized for detection and/or diagnosis of SARS-CoV-2 by FDA under an Emergency Use Authorization (EUA). This EUA will remain in effect (meaning this test can be used) for the duration of the COVID-19 declaration under Section 564(b)(1) of the Act, 21 U.S.C. section  360bbb-3(b)(1), unless the authorization is terminated or revoked.  Performed at [facility], [place] [address] [place], [place] [address]   MRSA PCR Screening     Status: None   Collection Time: 03/05/21 10:41 AM  Result Value Ref Range Status   MRSA by PCR NEGATIVE NEGATIVE Final    Comment:        The GeneXpert MRSA Assay (FDA approved for NASAL specimens only), is one component of a comprehensive MRSA colonization surveillance program. It is not intended to diagnose MRSA infection nor to guide or monitor treatment for MRSA infections. Performed at [facility], [place] [address] [place], [place] [address]   Respiratory (~20 pathogens) panel by PCR     Status: None   Collection Time: 03/05/21 10:42 AM   Specimen: Nasopharyngeal Swab; Respiratory  Result Value Ref Range Status   Adenovirus NOT DETECTED NOT DETECTED Final   Coronavirus 229E NOT DETECTED NOT DETECTED Final    Comment: (NOTE) The Coronavirus on the Respiratory Panel, DOES NOT test for the novel  Coronavirus (2019 nCoV)    Coronavirus HKU1 NOT DETECTED NOT DETECTED Final   Coronavirus NL63 NOT DETECTED NOT DETECTED Final   Coronavirus OC43 NOT DETECTED NOT DETECTED Final   Metapneumovirus NOT DETECTED NOT DETECTED Final   Rhinovirus / Enterovirus NOT DETECTED NOT DETECTED Final   Influenza A NOT DETECTED NOT DETECTED Final   Influenza B NOT DETECTED NOT DETECTED Final   Parainfluenza Virus 1 NOT DETECTED NOT DETECTED Final   Parainfluenza Virus  2 NOT DETECTED NOT DETECTED Final   Parainfluenza Virus 3 NOT DETECTED NOT DETECTED Final   Parainfluenza Virus 4 NOT DETECTED NOT DETECTED Final   Respiratory Syncytial Virus NOT DETECTED NOT DETECTED Final   Bordetella pertussis NOT DETECTED NOT DETECTED Final   Bordetella Parapertussis NOT DETECTED NOT DETECTED Final   Chlamydophila pneumoniae NOT DETECTED NOT DETECTED Final   Mycoplasma pneumoniae NOT DETECTED NOT DETECTED  Final    Comment: Performed at [facility], [place] [address] [place], [place] [address]  Gastrointestinal Panel by PCR , Stool     Status: None   Collection Time: 03/05/21  6:28 PM   Specimen: Stool  Result Value Ref Range Status   Campylobacter species NOT DETECTED NOT DETECTED Final   Plesimonas shigelloides NOT DETECTED NOT DETECTED Final   Salmonella species NOT DETECTED NOT DETECTED Final   Yersinia enterocolitica NOT DETECTED NOT DETECTED Final   Vibrio species NOT DETECTED NOT DETECTED Final   Vibrio cholerae NOT DETECTED NOT DETECTED Final   Enteroaggregative E coli (EAEC) NOT DETECTED NOT DETECTED Final   Enteropathogenic E coli (EPEC) NOT DETECTED NOT DETECTED Final   Enterotoxigenic E coli (ETEC) NOT DETECTED NOT DETECTED Final   Shiga like toxin producing E coli (STEC) NOT DETECTED NOT DETECTED Final   Shigella/Enteroinvasive E coli (EIEC) NOT DETECTED NOT DETECTED Final   Cryptosporidium NOT DETECTED NOT DETECTED Final   Cyclospora cayetanensis NOT DETECTED NOT DETECTED Final   Entamoeba histolytica NOT DETECTED NOT DETECTED Final   Giardia lamblia NOT DETECTED NOT DETECTED Final   Adenovirus F40/41 NOT DETECTED NOT DETECTED Final   Astrovirus NOT DETECTED NOT DETECTED Final   Norovirus GI/GII NOT DETECTED NOT DETECTED Final   Rotavirus A NOT DETECTED NOT DETECTED Final   Sapovirus (I, II, IV, and V) NOT DETECTED NOT DETECTED Final    Comment: Performed at [facility], [place] [place], [place] [address]  Pneumocystis smear by DFA     Status: None   Collection Time: 03/06/21  1:09 PM   Specimen: Sputum; Respiratory  Result Value Ref Range Status   Specimen Source-PJSRC SPUTUM  Final   Pneumocystis jiroveci Ag NEGATIVE  Final    Comment: Performed at [place] Performed at [facility] Performed at [facility] [place], [place] [address], [place], [place] [address]   Culture, Respiratory w Gram Stain      Status: None   Collection Time: 03/06/21  1:10 PM   Specimen: Tracheal Aspirate; Respiratory  Result Value Ref Range Status   Specimen Description   Final    TRACHEAL ASPIRATE Performed at [facility] [address], [place], [place] [address]    Special Requests   Final    Immunocompromised Performed at [facility], [place] [address], [place], [place] [address]    Gram Stain   Final    FEW WBC PRESENT,BOTH PMN AND MONONUCLEAR RARE SQUAMOUS EPITHELIAL CELLS PRESENT RARE GRAM POSITIVE RODS RARE GRAM POSITIVE COCCI IN PAIRS Performed at [facility], [place] [address], [place], [place] [address]    Culture RARE STAPHYLOCOCCUS AUREUS  Final   Report Status 03/09/2021 FINAL  Final   Organism ID, Bacteria STAPHYLOCOCCUS AUREUS  Final      Susceptibility   Staphylococcus aureus - MIC*    CIPROFLOXACIN <=0.5 SENSITIVE Sensitive     ERYTHROMYCIN <=0.25 SENSITIVE Sensitive     GENTAMICIN <=0.5 SENSITIVE Sensitive  OXACILLIN <=0.25 SENSITIVE Sensitive     TETRACYCLINE >=16 RESISTANT Resistant     VANCOMYCIN <=0.5 SENSITIVE Sensitive     TRIMETH/SULFA <=10 SENSITIVE Sensitive     CLINDAMYCIN <=0.25 SENSITIVE Sensitive     RIFAMPIN <=0.5 SENSITIVE Sensitive     Inducible Clindamycin NEGATIVE Sensitive     * RARE STAPHYLOCOCCUS AUREUS  Blastomyces Antigen     Status: None   Collection Time: 03/12/21  5:55 PM   Specimen: Blood  Result Value Ref Range Status   Blastomyces Antigen None Detected None Detected ng/mL Final    Comment: (NOTE) Results reported as ng/mL in 0.2 - 14.7 ng/mL range Results above the limit of detection but below 0.2 ng/mL are reported as 'Positive, Below the Limit of Quantification' Results above 14.7 ng/mL are reported as 'Positive, Above the Limit of Quantification'    Specimen Type SERUM  Final    Comment: (NOTE) Performed At: [facility] [place], [place] [id number] Bruce Donath  MD [id number]   Virus culture     Status: None   Collection Time: 03/12/21  7:08 PM   Specimen: CSF  Result Value Ref Range Status   Viral Culture No virus isolated.  Corrected    Comment: (NOTE) Performed At: [facility] [address] [place], [place] [id number] Rush Farmer MD MA:[id number] CORRECTED ON 07/20 AT 4239: PREVIOUSLY REPORTED AS Comment    Source of Sample BLOOD  Final    Comment: Performed at [facility] [address], [place], [place] [address]  Virus culture     Status: None   Collection Time: 03/13/21  4:42 PM   Specimen: PATH Cytology CSF; Cerebrospinal Fluid  Result Value Ref Range Status   Viral Culture No virus isolated.  Corrected    Comment: (NOTE) Please indicate source of specimen on all future request forms. Performed At: [facility] [address] [place], [place] [id number] Rush Farmer MD UO:[id number] CORRECTED ON 07/19 AT 1036: PREVIOUSLY REPORTED AS Comment    Source of Sample CSF  Final    Comment: Performed at [facility], [place] [address] [place], [place] [address]  CSF culture w Gram Stain     Status: None   Collection Time: 03/13/21  4:42 PM   Specimen: PATH Cytology CSF; Cerebrospinal Fluid  Result Value Ref Range Status   Specimen Description   Final    CSF Performed at [facility] [address] [place], [place] [address]    Special Requests   Final    NONE Performed at [facility], [place] [address] [place], [place] [address]    Gram Stain   Final    NO ORGANISMS SEEN Gram Stain Report Called to,Read Back By and Verified With: ETHAN RN AT 9753 ON 03/13/21 BY S.VANHOORNE Performed at [facility], [place] [address] [place], [place] [address]    Culture   Final    NO GROWTH 3 DAYS Performed at [facility], [place] [address] [place], [place] [address]    Report Status 03/17/2021 FINAL  Final  Fungus  Culture With Stain     Status: Abnormal   Collection Time: 03/13/21  4:42 PM   Specimen: PATH Cytology CSF; Cerebrospinal Fluid  Result Value Ref Range Status   Fungus Stain QNSTST (A)  Final    Comment: (NOTE) Test not performed. Insufficient specimen to perform or complete analysis.      Christian S.  was notified 03/17/2021.    Fungus (Mycology) Culture QNSTST (A)  Final    Comment: (NOTE) Test not performed. Insufficient specimen to perform or complete analysis.      Darrick Meigs S. was notified 03/17/2021. Performed At: [facility] [place], [place] [id number] Rush Farmer MD GM:[id number]    Fungal Source CSF  Final    Comment: Performed at [facility] [address] [place], [place] [address]  Culture, fungus without smear     Status: None (Preliminary result)   Collection Time: 03/13/21  4:42 PM   Specimen: PATH Cytology CSF; Cerebrospinal Fluid  Result Value Ref Range Status   Specimen Description   Final    CSF Performed at [facility] [address], [place], [place] [address]    Special Requests   Final    NONE Performed at [facility], [place] [address], [place], [place] [address]    Culture   Final    NO FUNGUS ISOLATED AFER 12 DAYS Performed at [facility], [place] [address], [place], [place] [address]    Report Status PENDING  Incomplete  Culture, Respiratory w Gram Stain     Status: None   Collection Time: 03/14/21  3:15 AM   Specimen: Tracheal Aspirate; Respiratory  Result Value Ref Range Status   Specimen Description   Final    TRACHEAL ASPIRATE Performed at [facility] [address] [place], [place] [address]    Special Requests   Final    NONE Performed at [facility], [place] [address] [place], [place] [address]    Gram Stain   Final    MODERATE WBC PRESENT,BOTH PMN AND MONONUCLEAR NO ORGANISMS SEEN    Culture   Final     NO GROWTH 2 DAYS Performed at [facility], [place] [address], [place], [place] [address]    Report Status 03/16/2021 FINAL  Final  Culture, Respiratory w Gram Stain     Status: None   Collection Time: 03/16/21  9:40 AM   Specimen: Bronchoalveolar Lavage; Respiratory  Result Value Ref Range Status   Specimen Description   Final    BRONCHIAL ALVEOLAR LAVAGE Performed at [facility] [address], [place], [place] [address]    Special Requests   Final    NONE Performed at [facility], [place] [address] [place], [place] [address]    Gram Stain   Final    FEW WBC PRESENT,BOTH PMN AND MONONUCLEAR NO ORGANISMS SEEN    Culture   Final    RARE Normal respiratory flora-no Staph aureus or Pseudomonas seen Performed at [facility], [address] [place] [address], [place], [place] [address]    Report Status 03/18/2021 FINAL  Final  Fungus Culture With Stain     Status: None (Preliminary result)   Collection Time: 03/16/21  9:40 AM   Specimen: Bronchoalveolar Lavage  Result Value Ref Range Status   Fungus Stain Final report  Final    Comment: (NOTE) Performed At: [facility] [place], [place] [id number] Rush Farmer MD [id number]    Fungus (Mycology) Culture PENDING  Incomplete   Fungal Source BRONCHIAL ALVEOLAR LAVAGE  Final    Comment: Performed at [facility], [place] [address], [place], [place] [address]  Pneumocystis smear by DFA     Status: None   Collection Time: 03/16/21  9:40 AM   Specimen: Bronchoalveolar  Lavage; Respiratory  Result Value Ref Range Status   Specimen Source-PJSRC BRONCHIAL ALVEOLAR LAVAGE  Final   Pneumocystis jiroveci Ag NEGATIVE  Final    Comment: Performed at [facility] [address] [place], [place] [address]  Fungus Culture Result     Status: None   Collection Time: 03/16/21  9:40 AM  Result Value Ref Range Status   Result 1 Comment  Final     Comment: (NOTE) KOH/Calcofluor preparation:  no fungus observed. Performed At: [facility] [place], [place] [id number] Rush Farmer MD [id number]   Fungus Culture With Stain     Status: None (Preliminary result)   Collection Time: 03/16/21  9:41 AM   Specimen: Bronchoalveolar Lavage  Result Value Ref Range Status   Fungus Stain Final report  Final    Comment: (NOTE) Performed At: [facility] [address] [place], [place] [id number] Rush Farmer MD [id number]    Fungus (Mycology) Culture PENDING  Incomplete   Fungal Source BRONCHIAL ALVEOLAR LAVAGE  Final    Comment: Performed at [facility], [place] [address], [place], [place] [address]  Culture, Respiratory w Gram Stain     Status: None   Collection Time: 03/16/21  9:41 AM   Specimen: Bronchoalveolar Lavage; Respiratory  Result Value Ref Range Status   Specimen Description   Final    BRONCHIAL ALVEOLAR LAVAGE Performed at [place] [address] [place], [place] [address]    Special Requests   Final    NONE Performed at [facility], [place] [address] [place], [place] [address]    Gram Stain   Final    NO WBC SEEN NO ORGANISMS SEEN Performed at [facility], [place] [address], [place], [place] [address]    Culture RARE STAPHYLOCOCCUS EPIDERMIDIS  Final   Report Status 03/18/2021 FINAL  Final   Organism ID, Bacteria STAPHYLOCOCCUS EPIDERMIDIS  Final      Susceptibility   Staphylococcus epidermidis - MIC*    CIPROFLOXACIN 4 RESISTANT Resistant     ERYTHROMYCIN >=8 RESISTANT Resistant     GENTAMICIN <=0.5 SENSITIVE Sensitive     OXACILLIN >=4 RESISTANT Resistant     TETRACYCLINE >=16 RESISTANT Resistant     VANCOMYCIN 1 SENSITIVE Sensitive     TRIMETH/SULFA 40 SENSITIVE Sensitive     CLINDAMYCIN >=8 RESISTANT Resistant     RIFAMPIN 2 INTERMEDIATE Intermediate     Inducible Clindamycin NEGATIVE Sensitive     *  RARE STAPHYLOCOCCUS EPIDERMIDIS  Fungus Culture Result     Status: None   Collection Time: 03/16/21  9:41 AM  Result Value Ref Range Status   Result 1 Comment  Final    Comment: (NOTE) KOH/Calcofluor preparation:  no fungus observed. Performed At: [facility] [place], [place] [id number] Rush Farmer MD [id number]     Studies/Results: No results found.    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 male with HIV/AIDS successfully suppressed on Dovato after initial period of imperfect adherence, admission with what became VDRF, and ARF in context of sepsis  #1 Respiratory failure  We will have him complete a 21 day course of consecutive days of PCP treatment (including steroids)  Sp rx for MSSA and MRSE VAP  Continue pulmonary toilet  #2 Acute renal failure: he had been on CVVDH but has had remarkable recovery. I have read Nephrology's most recent note and they are forecasting complete renal recovery  #3 HIV/AIDS : continue Dovato  #4 Feet pain:he says he has chronic gout bilaterally but I do not believe he has acute gout in context of steroids     LOS: 21 days   Alcide Evener 03/25/2021, 5:36 PM

## 2021-03-25 NOTE — Progress Notes (Signed)
Physical Therapy Treatment Patient Details Name: Bruce Hunter MRN: [id number] DOB: 07-26-1976 Today's Date: 03/25/2021    History of Present Illness Pt adm to [facility] 6/30 with acute hypoxic respiratory failure with sepsis. Pt with pneumocystis carinii pneumonia. Pt intubated 7/1 and trached 7/13. Remains on trach collar. Pt on CRRT 7/2- 7/15. Transferred to [facility] on 7/16.  PMH - HIV/AIDS diagnosed 02/2021, CKD, asthma, obesity, DM2    PT Comments    Pt making good progress with mobility and able to partially stand with assist for the first time. Recommend exploring the possibility of CIR for further rehab. Will continue to work on bed mobility, standing, and transfers.   Follow Up Recommendations  CIR     Equipment Recommendations  Wheelchair cushion (measurements PT);Wheelchair (measurements PT);Hospital bed;Other (comment) (hoyer lift)    Recommendations for Other Services Rehab consult     Precautions / Restrictions Precautions Precautions: Fall Precaution Comments: trach with PSMV, flexiseal, foley    Mobility  Bed Mobility Overal bed mobility: Needs Assistance Bed Mobility: Sit to Sidelying;Sidelying to Sit   Sidelying to sit: +2 for physical assistance;Max assist     Sit to sidelying: Mod assist General bed mobility comments: Assist to bring legs off of bed, elevate trunk into sitting and bring hips to EOB. Assist to bring legs back up into bed returning to sidelying.    Transfers Overall transfer level: Needs assistance Equipment used: Ambulation equipment used Transfers: Sit to/from Stand Sit to Stand: +2 physical assistance;Max assist         General transfer comment: Used bed pad under hips to bring hips up. Pt unable to fully extend hips and trunk to achieve full upright. Performed x 3.  Ambulation/Gait                 Stairs             Wheelchair Mobility    Modified Rankin (Stroke Patients Only)       Balance Overall balance  assessment: Needs assistance Sitting-balance support: Feet supported Sitting balance-Leahy Scale: Fair Sitting balance - Comments: Initially assist due to posterior lean but then able to maintain static   Standing balance support: Bilateral upper extremity supported Standing balance-Leahy Scale: Zero Standing balance comment: Partially stood x 3 with +2 max assist. Stood ~10-15 each time                            Cognition Arousal/Alertness: Awake/alert Behavior During Therapy: Flat affect Overall Cognitive Status: Impaired/Different from baseline Area of Impairment: Following commands;Memory;Safety/judgement;Awareness;Problem solving;Attention                   Current Attention Level: Selective Memory: Decreased short-term memory Following Commands: Follows one step commands with increased time;Follows one step commands consistently Safety/Judgement: Decreased awareness of safety;Decreased awareness of deficits Awareness: Emergent Problem Solving: Slow processing;Decreased initiation;Difficulty sequencing;Requires verbal cues;Requires tactile cues        Exercises General Exercises - Lower Extremity Long Arc Quad: AROM;Both;Seated;10 reps    General Comments        Pertinent Vitals/Pain Pain Assessment: Faces Faces Pain Scale: Hurts little more Pain Location: lt foot Pain Descriptors / Indicators: Grimacing;Guarding Pain Intervention(s): Limited activity within patient's tolerance;Monitored during session;Repositioned    Home Living                      Prior Function  PT Goals (current goals can now be found in the care plan section) Acute Rehab PT Goals Patient Stated Goal: to get stronger Progress towards PT goals: Progressing toward goals    Frequency    Min 3X/week      PT Plan Discharge plan needs to be updated;Frequency needs to be updated    Co-evaluation              AM-PAC PT "6 Clicks" Mobility    Outcome Measure  Help needed turning from your back to your side while in a flat bed without using bedrails?: A Lot Help needed moving from lying on your back to sitting on the side of a flat bed without using bedrails?: Total Help needed moving to and from a bed to a chair (including a wheelchair)?: Total Help needed standing up from a chair using your arms (e.g., wheelchair or bedside chair)?: Total Help needed to walk in hospital room?: Total Help needed climbing 3-5 steps with a railing? : Total 6 Click Score: 7    End of Session Equipment Utilized During Treatment: Oxygen (trach) Activity Tolerance: Patient tolerated treatment well Patient left: in bed;with call bell/phone within reach;with bed alarm set Nurse Communication: Mobility status PT Visit Diagnosis: Muscle weakness (generalized) (M62.81);Other abnormalities of gait and mobility (R26.89)     Time: [id number] PT Time Calculation (min) (ACUTE ONLY): 28 min  Charges:  $Therapeutic Activity: 23-37 mins                     [place] Pager [phone] Office [place] 03/25/2021, 12:58 PM

## 2021-03-25 NOTE — Progress Notes (Signed)
Subjective:  2400 of UOP-  crt down in the 1's-  sodium is normal -  he is having a lot of drainage from his trach   Objective Vital signs in last 24 hours: Vitals:   03/24/21 2328 03/24/21 2339 03/25/21 0401 03/25/21 0405  BP:  139/87 108/70 108/70  Pulse: (!) 103 99 89 77  Resp: 20 (!) _0 Temp:  98.7 F (37.1 C) 98.8 F (37.1 C)   TempSrc:  Oral Oral   SpO2: 97% 97% 97% 97%  Weight:      Height:       Weight change:   Intake/Output Summary (Last 24 hours) at 03/25/2021 0740 Last data filed at 03/24/2021 1847 Gross per 24 hour  Intake 240 ml  Output 1500 ml  Net -1260 ml    Assessment/ Plan: Pt is a 45 y.o. yo male with HIV/AIDS, DM, baseline CKD who was admitted on 03/04/2021 with acute hypoxic resp failure with sepsis and AKI  Assessment/Plan: 1. Renal-  reported baseline crt in the high 2's.  A on CRF in the setting of hypoxic resp failure and sepsis and also urinary retention.  Off and on CRRT-  most recently stopped on 7/15.  Since then making good urine and crt  down today to better than baseline.    temp HD cath is out.  Seems out of woods from a renal standpoint 2. VDRF-  trach-  on trach collar this AM- per CCM-  zyvox for PNA 3. Anemia- severe- on very low dose aranesp-  have inc dose-   iron stores low, giving iron as well-  s/p transfusion 7/18 -  would continue to complete iron load and cont aranesp while inpatient but will not need to be continued at discharge  5. Hypernatremia-  resolved now that can take PO's 6.  Urinary retention-  has been an issue this hosp-  giving flomax-  will need to rechallenge with foley removal at a later date- per primary team   Renal will sign off, call with questions   Louis Meckel    Labs: Basic Metabolic Panel: Recent Labs  Lab 03/21/21 0451 03/22/21 0508 03/23/21 0437 03/24/21 0955 03/25/21 0148  NA 136   < > 148* 138 138  K 4.0   < > 4.1 3.9 3.8  CL 103   < > 114* 108 108  CO2 21*   < > 23 22 20*   GLUCOSE 235*   < > 120* 167* 103*  BUN 111*   < > 102* 71* 55*  CREATININE 2.90*   < > 2.25* 1.71* 1.54*  CALCIUM 8.8*   < > 9.1 8.9 9.0  PHOS 7.3*  --   --  4.7* 5.0*   < > = values in this interval not displayed.   Liver Function Tests: Recent Labs  Lab 03/19/21 0428 03/21/21 0451 03/22/21 0508 03/24/21 0955 03/25/21 0148  AST 41  --  20  --   --   ALT 13  --  13  --   --   ALKPHOS 90  --  63  --   --   BILITOT 0.6  --  0.6  --   --   PROT 7.5  --  6.8  --   --   ALBUMIN 2.1*   < > 1.8* 2.0* 2.1*   < > = values in this interval not displayed.   No results for input(s): LIPASE, AMYLASE in the last 168 hours.  No results for input(s): AMMONIA in the last 168 hours. CBC: Recent Labs  Lab 03/19/21 0428 03/20/21 1026 03/21/21 0451 03/22/21 0508 03/22/21 1531 03/23/21 0437 03/25/21 0148  WBC 19.4*   < > 18.1* 12.0* 12.3* 8.4 10.0  NEUTROABS 14.2*  --   --   --   --   --   --   HGB 7.5*   < > 7.2* 6.5* 8.3* 7.2* 8.5*  HCT 24.9*   < > 24.2* 21.8* 27.4* 25.2* 27.5*  MCV 98.0   < > 96.8 97.8 95.5 97.7 93.9  PLT 169   < > 208 200 234 228 272   < > = values in this interval not displayed.   Cardiac Enzymes: No results for input(s): CKTOTAL, CKMB, CKMBINDEX, TROPONINI in the last 168 hours. CBG: Recent Labs  Lab 03/23/21 1915 03/24/21 0823 03/24/21 1133 03/24/21 1629 03/24/21 1927  GLUCAP 102* 151* 113* 165* 124*    Iron Studies:  Recent Labs    03/23/21 0437  IRON 18*  TIBC 196*  FERRITIN 877*   Studies/Results: No results found. Medications: Infusions:  sodium chloride 250 mL (03/21/21 1408)   sodium chloride 250 mL (03/25/21 0345)   clindamycin (CLEOCIN) IV 900 mg (03/24/21 2148)   dextrose 50 mL/hr at 03/23/21 1300   ferric gluconate (FERRLECIT) IVPB 250 mg (03/24/21 4782)    Scheduled Medications:  alteplase  2 mg Intracatheter Once   alteplase  2 mg Intracatheter Once   amLODipine  10 mg Oral Daily   chlorhexidine gluconate (MEDLINE KIT)  15  mL Mouth Rinse BID   Chlorhexidine Gluconate Cloth  6 each Topical Daily   clonazePAM  0.5 mg Oral BID   cloNIDine  0.1 mg Oral Daily   [START ON 03/28/2021] darbepoetin (ARANESP) injection - NON-DIALYSIS  200 mcg Subcutaneous Q Sun-1800   dolutegravir  50 mg Oral Daily   gabapentin  100 mg Oral Q12H   heparin injection (subcutaneous)  5,000 Units Subcutaneous Q8H   insulin aspart  0-15 Units Subcutaneous TID WC   insulin aspart  0-5 Units Subcutaneous QHS   insulin glargine  5 Units Subcutaneous Daily   ipratropium-albuterol  3 mL Nebulization TID   lamiVUDine  300 mg Oral Daily   lidocaine  1 patch Transdermal Q24H   mouth rinse  15 mL Mouth Rinse q12n4p   multivitamin with minerals  1 tablet Oral Daily   oxyCODONE  5 mg Oral Q8H   predniSONE  20 mg Oral Q breakfast   primaquine  30 mg Oral Daily   sodium chloride flush  10-40 mL Intracatheter Q12H   tamsulosin  0.4 mg Oral Daily    have reviewed scheduled and prn medications.  Physical Exam: General: drainage from trach-  trying to cough-   Heart: RRR Lungs: CBS bilat Abdomen: soft, non tender Extremities: min edema if any Dialysis Access: right IJ cath has been removed   03/25/2021,7:40 AM  LOS: 21 days

## 2021-03-25 NOTE — Progress Notes (Addendum)
PROGRESS NOTE    Bruce Hunter  YBO:[id number] DOB: Mar 10, 1976 DOA: 03/04/2021 PCP: Elwyn Reach, MD   Brief Narrative: KIMBALL APPLEBY is a 45 y.o. male with a history of HIV/AIDS, chronic pain, diabetes mellitus type 2 with diabetic neuropathy, esophageal candidiasis, diverticulosis with recent GI bleeding.  Patient presented secondary to acute respiratory failure with hypoxia.  Patient required ICU admission and intubation.  While admitted, patient developed severe AKI requiring initiation of CRRT.  Infectious disease was consulted as well for diagnosis of PCP.  Work-up for possible meningitis was negative.  Patient was transitioned from ETT to tracheostomy and was eventually successfully taken off CRRT.  Admission has also been complicated by recurrent anemia requiring multiple transfusions.   Assessment & Plan:   Principal Problem:   AIDS (acquired immune deficiency syndrome) ([place]) Active Problems:   Acute renal failure (HCC)   Candida esophagitis (HCC)   VAP (ventilator-associated pneumonia) (HCC)   Acute hypoxemic respiratory failure (HCC)   PCP (pneumocystis carinii pneumonia) (HCC)   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 (HCC)   Elevated LFTs   Septic shock (HCC)   Pressure injury of skin   Acute metabolic encephalopathy   Severe sepsis (HCC)   Altered mental status   Status post tracheostomy ([place])   Acute respiratory failure with hypoxia Secondary to presumed PCP pneumonia. Patient required intubation on 7/1 and was transitioned to tracheostomy performed on 7/13.  Patient is currently stable on trach collar.  Patient using Passy-Muir valve for speaking and eating per speech therapy recommendations.  Infectious disease on board and is treating patient empirically for PCP with clindamycin, primaquine, prednisone with recommendations for total 21 days of treatment.  Acute metabolic encephalopathy In setting of infection. Resolved.  AKI on  CKD stage IIIb Secondary to ATN. Patient presented with a creatinine of 5.88 and started on CRRT. CRRT stopped on 7/15. Creatinine continues to improve. -Nephrology recommendations: No dialysis at this time, follow renal function  Chronic pain Substance abuse disorder Patient was managed with Precedex in the ICU which was weaned off. Clonidine taper started. Oxycodone also started for pain management. Oxycodone is scheduled. Patient has fentanyl IV prn. -Will need to transition to oxycodone prn and discontinue fentanyl -Continue Klonopin  Anxiety Patient is on Klonopin as an outpatient -Continue Klonopin  HIV -Continue Dovato  Prediabetes Hemoglobin A1c of 5.9% on February 12, 2021.  Rechecked in July with a hemoglobin A1c of 5.4%.  Started on Lantus 5 units and SSI while in the ICU.  Currently on prednisone for PCP treatment. -Continue Lantus and sliding scale insulin  Anemia of chronic disease Acute anemia Anemia appears to be chronic.  Previous baseline of around 9.  Patient has required a total of 5 units of PRBC.  Transfusion for recurrent anemia. Iron panel consistent with anemia of chronic disease. Iron levels low.  Patient with a history of fecal occult blood positive stool in June 2022; at that time upper endoscopy and colonoscopy were significant for scattered diverticula throughout entire colon, internal hemorrhoids, erythematous mucosa of the fundus, duodenitis and candidiasis. Hemoglobin appears to be trending down again.  -Daily CBC -Transfusion for hemoglobin less than 7 -Iron load x4 days  Esophageal candidiasis Seen on EGD in June. Recommendation at that time for Diflucan x7 weeks. Course appears to have completed.  Urinary retention Patient had a foley catheter in the ICU which was removed. Patient developed recurrent urinary retention and foley catheter replaced on  7/20. Flomax started. -Continue foley catheter and Flomax -Outpatient urology follow-up  Pressure  injury Medial/upper sacrum, POA. Left/right buttocks, unknown if present on admission.   DVT prophylaxis: Heparin subcu Code Status:   Code Status: Full Code Family Communication: None at bedside Disposition Plan: Discharge to CIR once bed is available. Medically stable for discharge to CIR.   Consultants:  PCCM Nephrology Infectious disease  Procedures:    Antimicrobials: Clindamycin IV Dolutegravir Emtricitabine-Tenofovir Lamivudine Linezolid Primaquine Vancomycin IV   Subjective: No significant issues overnight. Some continued pain in his feet. No other concerns.  Objective: Vitals:   03/25/21 0939 03/25/21 1140 03/25/21 1238 03/25/21 1347  BP: (!) 160/97 129/75 110/63 110/63  Pulse: 99 92 97 97  Resp: 19 19 (!) 21 15  Temp:   98.9 F (37.2 C)   TempSrc:   Axillary   SpO2: 99% 99% 99% 100%  Weight:      Height:        Intake/Output Summary (Last 24 hours) at 03/25/2021 1602 Last data filed at 03/25/2021 1503 Gross per 24 hour  Intake --  Output 3225 ml  Net -3225 ml    Filed Weights   03/18/21 0500 03/19/21 0500 03/22/21 0600  Weight: 93.8 kg 89.3 kg 79.6 kg    Examination:  General exam: Appears calm and comfortable Respiratory system: Diffuse rhonchi. Respiratory effort normal. Cardiovascular system: S1 & S2 heard, RRR. No murmurs, rubs, gallops or clicks. Gastrointestinal system: Abdomen is nondistended, soft and nontender. No organomegaly or masses felt. Normal bowel sounds heard. Central nervous system: Alert and oriented. No focal neurological deficits. Musculoskeletal: No calf tenderness Skin: No cyanosis. No rashes Psychiatry: Judgement and insight appear normal. Mood & affect appropriate.     Data Reviewed: I have personally reviewed following labs and imaging studies  CBC Lab Results  Component Value Date   WBC 10.0 03/25/2021   RBC 2.93 (L) 03/25/2021   HGB 8.5 (L) 03/25/2021   HCT 27.5 (L) 03/25/2021   MCV 93.9 03/25/2021    MCH 29.0 03/25/2021   PLT 272 03/25/2021   MCHC 30.9 03/25/2021   RDW 19.8 (H) 03/25/2021   LYMPHSABS 2.1 03/19/2021   MONOABS 2.0 (H) 03/19/2021   EOSABS 0.2 03/19/2021   BASOSABS 0.0 10/62/6948     Last metabolic panel Lab Results  Component Value Date   NA 138 03/25/2021   K 3.8 03/25/2021   CL 108 03/25/2021   CO2 20 (L) 03/25/2021   BUN 55 (H) 03/25/2021   CREATININE 1.54 (H) 03/25/2021   GLUCOSE 103 (H) 03/25/2021   GFRNONAA 57 (L) 03/25/2021   GFRAA >60 05/29/2011   CALCIUM 9.0 03/25/2021   PHOS 5.0 (H) 03/25/2021   PROT 6.8 03/22/2021   ALBUMIN 2.1 (L) 03/25/2021   BILITOT 0.6 03/22/2021   ALKPHOS 63 03/22/2021   AST 20 03/22/2021   ALT 13 03/22/2021   ANIONGAP 10 03/25/2021    CBG (last 3)  Recent Labs    03/24/21 1927 03/25/21 0757 03/25/21 1241  GLUCAP 124* 117* 151*      GFR: Estimated Creatinine Clearance: 65.2 mL/min (A) (by C-G formula based on SCr of 1.54 mg/dL (H)).  Coagulation Profile: Recent Labs  Lab 03/21/21 0451 03/22/21 1531  INR 1.1 1.1     Recent Results (from the past 240 hour(s))  Culture, Respiratory w Gram Stain     Status: None   Collection Time: 03/16/21  9:40 AM   Specimen: Bronchoalveolar Lavage; Respiratory  Result Value Ref Range  Status   Specimen Description   Final    BRONCHIAL ALVEOLAR LAVAGE Performed at [facility] [address] [place], [place] [address]    Special Requests   Final    NONE Performed at [facility], [place] [address] [place], [place] [address]    Gram Stain   Final    FEW WBC PRESENT,BOTH PMN AND MONONUCLEAR NO ORGANISMS SEEN    Culture   Final    RARE Normal respiratory flora-no Staph aureus or Pseudomonas seen Performed at [facility], [address], [place], [place] [address]    Report Status 03/18/2021 FINAL  Final  Fungus Culture With Stain     Status: None (Preliminary result)   Collection Time: 03/16/21  9:40 AM   Specimen:  Bronchoalveolar Lavage  Result Value Ref Range Status   Fungus Stain Final report  Final    Comment: (NOTE) Performed At: [facility] [place], [place] [id number] Rush Farmer MD RJ:[id number]    Fungus (Mycology) Culture PENDING  Incomplete   Fungal Source BRONCHIAL ALVEOLAR LAVAGE  Final    Comment: Performed at [facility], [place] [address], [place], [place] [address]  Pneumocystis smear by DFA     Status: None   Collection Time: 03/16/21  9:40 AM   Specimen: Bronchoalveolar Lavage; Respiratory  Result Value Ref Range Status   Specimen Source-PJSRC BRONCHIAL ALVEOLAR LAVAGE  Final   Pneumocystis jiroveci Ag NEGATIVE  Final    Comment: Performed at [facility] [address] [place], [place] [address]  Fungus Culture Result     Status: None   Collection Time: 03/16/21  9:40 AM  Result Value Ref Range Status   Result 1 Comment  Final    Comment: (NOTE) KOH/Calcofluor preparation:  no fungus observed. Performed At: [facility] [place], [place] [id number] Rush Farmer MD [id number]   Fungus Culture With Stain     Status: None (Preliminary result)   Collection Time: 03/16/21  9:41 AM   Specimen: Bronchoalveolar Lavage  Result Value Ref Range Status   Fungus Stain Final report  Final    Comment: (NOTE) Performed At: [facility] [address] [place], [place] [id number] Rush Farmer MD [id number]    Fungus (Mycology) Culture PENDING  Incomplete   Fungal Source BRONCHIAL ALVEOLAR LAVAGE  Final    Comment: Performed at [facility], [place] [address], [place], [place] [address]  Culture, Respiratory w Gram Stain     Status: None   Collection Time: 03/16/21  9:41 AM   Specimen: Bronchoalveolar Lavage; Respiratory  Result Value Ref Range Status   Specimen Description   Final    BRONCHIAL ALVEOLAR LAVAGE Performed at [facility]  [address], [place], [place] [address]    Special Requests   Final    NONE Performed at [facility], [place] [address] [place], [place] [address]    Gram Stain   Final    NO WBC SEEN NO ORGANISMS SEEN Performed at [facility], [place] [address], [place], [place] [address]    Culture RARE STAPHYLOCOCCUS EPIDERMIDIS  Final   Report Status 03/18/2021 FINAL  Final   Organism ID, Bacteria STAPHYLOCOCCUS EPIDERMIDIS  Final      Susceptibility   Staphylococcus epidermidis - MIC*    CIPROFLOXACIN 4 RESISTANT Resistant     ERYTHROMYCIN >=8 RESISTANT Resistant     GENTAMICIN <=0.5  SENSITIVE Sensitive     OXACILLIN >=4 RESISTANT Resistant     TETRACYCLINE >=16 RESISTANT Resistant     VANCOMYCIN 1 SENSITIVE Sensitive     TRIMETH/SULFA 40 SENSITIVE Sensitive     CLINDAMYCIN >=8 RESISTANT Resistant     RIFAMPIN 2 INTERMEDIATE Intermediate     Inducible Clindamycin NEGATIVE Sensitive     * RARE STAPHYLOCOCCUS EPIDERMIDIS  Fungus Culture Result     Status: None   Collection Time: 03/16/21  9:41 AM  Result Value Ref Range Status   Result 1 Comment  Final    Comment: (NOTE) KOH/Calcofluor preparation:  no fungus observed. Performed At: [facility] [place], [place] [id number] Rush Farmer MD WU:[id number]          Radiology Studies: No results found.      Scheduled Meds:  amLODipine  10 mg Oral Daily   chlorhexidine gluconate (MEDLINE KIT)  15 mL Mouth Rinse BID   Chlorhexidine Gluconate Cloth  6 each Topical Daily   clonazePAM  0.5 mg Oral BID   [START ON 03/28/2021] darbepoetin (ARANESP) injection - NON-DIALYSIS  200 mcg Subcutaneous Q Sun-1800   dolutegravir  50 mg Oral Daily   gabapentin  100 mg Oral Q12H   heparin injection (subcutaneous)  5,000 Units Subcutaneous Q8H   insulin aspart  0-15 Units Subcutaneous TID WC   insulin aspart  0-5 Units Subcutaneous QHS   insulin glargine  5 Units Subcutaneous Daily    ipratropium-albuterol  3 mL Nebulization TID   lamiVUDine  300 mg Oral Daily   lidocaine  1 patch Transdermal Q24H   mouth rinse  15 mL Mouth Rinse q12n4p   multivitamin with minerals  1 tablet Oral Daily   oxyCODONE  5 mg Oral Q8H   predniSONE  20 mg Oral Q breakfast   primaquine  30 mg Oral Daily   sodium chloride flush  10-40 mL Intracatheter Q12H   Continuous Infusions:  sodium chloride 250 mL (03/21/21 1408)   sodium chloride 250 mL (03/25/21 0345)   clindamycin (CLEOCIN) IV 900 mg (03/25/21 1503)   dextrose 50 mL/hr at 03/23/21 1300   ferric gluconate (FERRLECIT) IVPB 250 mg (03/25/21 1101)     LOS: 21 days     Cordelia Poche, MD [facility] 03/25/2021, 4:02 PM  If 7PM-7AM, please contact night-coverage [email or web address]

## 2021-03-26 DIAGNOSIS — J189 Pneumonia, unspecified organism: Secondary | ICD-10-CM | POA: Diagnosis not present

## 2021-03-26 DIAGNOSIS — R4182 Altered mental status, unspecified: Secondary | ICD-10-CM

## 2021-03-26 DIAGNOSIS — B20 Human immunodeficiency virus [HIV] disease: Secondary | ICD-10-CM | POA: Diagnosis not present

## 2021-03-26 DIAGNOSIS — G9341 Metabolic encephalopathy: Secondary | ICD-10-CM | POA: Diagnosis not present

## 2021-03-26 DIAGNOSIS — J9601 Acute respiratory failure with hypoxia: Secondary | ICD-10-CM | POA: Diagnosis not present

## 2021-03-26 DIAGNOSIS — M25512 Pain in left shoulder: Secondary | ICD-10-CM

## 2021-03-26 DIAGNOSIS — M25572 Pain in left ankle and joints of left foot: Secondary | ICD-10-CM

## 2021-03-26 LAB — RENAL FUNCTION PANEL
Albumin: 2.1 g/dL — ABNORMAL LOW (ref 3.5–5.0)
Anion gap: 9 (ref 5–15)
BUN: 34 mg/dL — ABNORMAL HIGH (ref 6–20)
CO2: 23 mmol/L (ref 22–32)
Calcium: 8.9 mg/dL (ref 8.9–10.3)
Chloride: 107 mmol/L (ref 98–111)
Creatinine, Ser: 1.4 mg/dL — ABNORMAL HIGH (ref 0.61–1.24)
GFR, Estimated: >60 mL/min (ref >60)
Glucose, Bld: 85 mg/dL (ref 70–99)
Phosphorus: 4 mg/dL (ref 2.5–4.6)
Potassium: 3.6 mmol/L (ref 3.5–5.1)
Sodium: 139 mmol/L (ref 135–145)

## 2021-03-26 LAB — GLUCOSE, CAPILLARY
Glucose-Capillary: 118 mg/dL — ABNORMAL HIGH (ref 70–99)
Glucose-Capillary: 143 mg/dL — ABNORMAL HIGH (ref 70–99)
Glucose-Capillary: 135 mg/dL — ABNORMAL HIGH (ref 70–99)
Glucose-Capillary: 89 mg/dL (ref 70–99)

## 2021-03-26 NOTE — Progress Notes (Signed)
Physical Therapy Treatment Patient Details Name: Bruce Hunter MRN: [id number] DOB: [birth year] Today's Date: 03/26/2021    History of Present Illness Pt adm to [name] 6/30 with acute hypoxic respiratory failure with sepsis. Pt with pneumocystis carinii pneumonia. Pt intubated 7/1 and trached 7/13. Remains on trach collar. Pt on CRRT 7/2- 7/15. Transferred to [facility] on 7/16.  PMH - HIV/AIDS diagnosed 02/2021, CKD, asthma, obesity, DM2    PT Comments    Pt was seen for mobility on bed with ROM and strengthening, and repositioning on the bed.  Pt is tired and wanted to nap but agreed to let PT assist the exercises.  Follow up with him for transfers to chair and to work on standing as tolerated.  Pt is getting some better active function on LE's to use for mobility.   Follow Up Recommendations  CIR     Equipment Recommendations  Wheelchair cushion (measurements PT);Wheelchair (measurements PT);Hospital bed;Other (comment)    Recommendations for Other Services Rehab consult     Precautions / Restrictions Precautions Precautions: Fall Precaution Comments: trach with PMV, flexiseal, foley Restrictions Weight Bearing Restrictions: No    Mobility  Bed Mobility Overal bed mobility: Needs Assistance Bed Mobility: Rolling Rolling: Min guard Sidelying to sit: Mod assist       General bed mobility comments: assisted to reposition on the bed including legs    Transfers Overall transfer level: Needs assistance   Transfers: Sit to/from Stand Sit to Stand: Min assist;From elevated surface         General transfer comment: pt declined  Ambulation/Gait                 Stairs             Wheelchair Mobility    Modified Rankin (Stroke Patients Only)       Balance Overall balance assessment: Needs assistance Sitting-balance support: No upper extremity supported;Feet supported Sitting balance-Leahy Scale: Fair     Standing balance support: Bilateral upper  extremity supported Standing balance-Leahy Scale: Zero Standing balance comment: Stood x2 with sara stedy from bed                            Cognition Arousal/Alertness: Awake/alert Behavior During Therapy: WFL for tasks assessed/performed Overall Cognitive Status: Impaired/Different from baseline Area of Impairment: Awareness;Safety/judgement;Following commands                   Current Attention Level: Selective Memory: Decreased short-term memory Following Commands: Follows one step commands inconsistently;Follows one step commands with increased time Safety/Judgement: Decreased awareness of safety;Decreased awareness of deficits Awareness: Emergent Problem Solving: Requires verbal cues;Requires tactile cues General Comments: Has PMV but then removes it half way through      Exercises General Exercises - Lower Extremity Ankle Circles/Pumps: AAROM;5 reps Quad Sets: AROM;10 reps Heel Slides: AAROM;10 reps Hip ABduction/ADduction: AAROM;10 reps Straight Leg Raises: AAROM;10 reps Hip Flexion/Marching: AAROM;10 reps Other Exercises Other Exercises: Re-assesed pt's arm ROM/Strength. Pt remains relatively weak in Bil shoulders (L>R); rest of arms are 4/5. He does have decreased elbow extension in both arms with R worse than L with hard end feel--he cannot tell me if this is the way it has always been or new since admission/recent.    General Comments General comments (skin integrity, edema, etc.): Pt is in bed in an awkward posture and reporting a lot of fatigue.  Encouraged him to do LE exercises, but with care  on LLE due to hypersensitivity on L foot and ankle      Pertinent Vitals/Pain Pain Assessment: Faces Faces Pain Scale: Hurts even more Pain Location: left foot Pain Descriptors / Indicators: Guarding;Tender Pain Intervention(s): Limited activity within patient's tolerance;Premedicated before session;Repositioned;Monitored during session    Home  Living                      Prior Function            PT Goals (current goals can now be found in the care plan section) Acute Rehab PT Goals Patient Stated Goal: to get stronger and go home    Frequency    Min 3X/week      PT Plan Current plan remains appropriate    Co-evaluation              AM-PAC PT "6 Clicks" Mobility   Outcome Measure  Help needed turning from your back to your side while in a flat bed without using bedrails?: A Lot Help needed moving from lying on your back to sitting on the side of a flat bed without using bedrails?: A Lot Help needed moving to and from a bed to a chair (including a wheelchair)?: Total Help needed standing up from a chair using your arms (e.g., wheelchair or bedside chair)?: Total Help needed to walk in hospital room?: Total Help needed climbing 3-5 steps with a railing? : Total 6 Click Score: 8    End of Session Equipment Utilized During Treatment: Oxygen Activity Tolerance: Patient tolerated treatment well Patient left: in bed;with call bell/phone within reach;with bed alarm set Nurse Communication: Mobility status PT Visit Diagnosis: Muscle weakness (generalized) (M62.81);Other abnormalities of gait and mobility (R26.89)     Time: [id number] PT Time Calculation (min) (ACUTE ONLY): 20 min  Charges:  $Therapeutic Exercise: 8-22 mins         Ramond Dial 03/26/2021, 3:55 PM  Mee Hives, PT MS Acute Rehab Dept. Number: [place] [facility] [place]

## 2021-03-26 NOTE — Progress Notes (Signed)
Inpatient Rehab Admissions Coordinator:   Met with patient and his family at the bedside.  I explained CIR expectations (3 hrs/day of therapy) and goals (potentially up to modified independence, but may require some supervision depending on progress).  I expect estimated length of stay to be about 12-14 days, again depending on progress.  Pt and family report he would only have intermittent supervision, so will need to ensure his continued progress before moving forward with possible admission.  Also note that pt's trach is a size 8.  Will need to be able to tolerate downsize to size 6 prior to being able to pursue insurance auth and possible admission.  I will f/u on Monday.   Shann Medal, PT, DPT Admissions Coordinator [phone] 03/26/21  4:06 PM

## 2021-03-26 NOTE — PMR Pre-admission (Signed)
PMR Admission Coordinator Pre-Admission Assessment  Patient: Bruce Hunter is an 45 y.o., male MRN: [id number] DOB: 04-Jun-1976 Height: 5' 10.98" (180.3 cm) Weight: 79.6 kg (Simultaneous filing. User may not have seen previous data.)  Insurance Information HMO:     PPO:      PCP:      IPA:      80/20:      OTHER:  PRIMARY: [place] [employer] [facility]      Policy#: [id number]      Subscriber:  CM Name: fax auth      Phone#:      Fax#: [phone]  Pre-Cert#: [id number] [place] for CIR provided via fax with updates due to fax listed above on 8/1     Employer:  Benefits:  Phone #: [phone]     Name:  Eff. Date: 03/05/21     Deduct: 0      Out of Pocket Max:       Life Max:  CIR: 100%      SNF:  Outpatient:      Co-Pay:  Home Health:       Co-Pay:  DME:      Co-Pay:  Providers:  SECONDARY:       Policy#:      Phone#:   Development worker, community:       Phone#:   The Engineer, petroleum" for patients in Inpatient Rehabilitation Facilities with attached "Privacy Act [place] Records" was provided and verbally reviewed with: N/A  Emergency Contact Information Contact Information     Name Relation Home Work [place] Mother [phone]  [phone]   Thurmond Butts   [phone]       Current Medical History  Patient Admitting Diagnosis: debility 2/2 respiratory failure, new trach  History of Present Illness: Bruce Hunter is a 45 y.o. male with a history of HIV/AIDS, chronic pain, diabetes mellitus type 2 with diabetic neuropathy, esophageal candidiasis, diverticulosis with recent GI bleeding.  Patient presented to [facility] on 6/30 secondary to acute respiratory failure with hypoxia.  Patient required ICU admission and intubation.  While admitted, patient developed severe AKI requiring initiation of CRRT.  Infectious disease was consulted as well for diagnosis of PCP PNA.  Work-up for possible meningitis was negative.  Patient was transitioned  from ETT to tracheostomy and was eventually successfully taken off CRRT.  Decannulated on 7/26 and tolerating room air.  Admission has also been complicated by recurrent anemia requiring multiple transfusions, as well as persistent BLE foot pain, workup  revealing gout.  Therapy evaluations were completed and pt was recommended for CIR.      Patient's medical record from [facility] has been reviewed by the rehabilitation admission coordinator and physician.  Past Medical History  Past Medical History:  Diagnosis Date   Asthma    Diabetes mellitus without complication ([place])    Gout    Hyperlipidemia    Hypertension    Obesity     Family History   family history includes Asthma in his mother; Cancer in his maternal grandmother and mother; Diabetes in his brother, maternal grandmother, mother, and another family member; Obesity in his brother, mother, and sister.  Prior Rehab/Hospitalizations Has the patient had prior rehab or hospitalizations prior to admission? Yes  Has the patient had major surgery during 100 days prior to admission? Yes   Current Medications  Current Facility-Administered Medications:    0.9 %  sodium chloride infusion, 250 mL, Intravenous, Continuous, Ina Homes  C, MD, Last Rate: 10 mL/hr at 03/21/21 1408, 250 mL at 03/21/21 1408   0.9 %  sodium chloride infusion, 250 mL, Intravenous, Continuous, Anders Simmonds, MD, Last Rate: 10 mL/hr at 03/25/21 0345, 250 mL at 03/25/21 0345   acetaminophen (TYLENOL) tablet 650 mg, 650 mg, Oral, Q6H PRN, Mannam, Praveen, MD   albuterol (PROVENTIL) (2.5 MG/3ML) 0.083% nebulizer solution 2.5 mg, 2.5 mg, Nebulization, Q2H PRN, Collier Bullock, MD, 2.5 mg at 03/22/21 0229   amLODipine (NORVASC) tablet 10 mg, 10 mg, Oral, Daily, Mannam, Praveen, MD, 10 mg at 03/26/21 1103   chlorhexidine gluconate (MEDLINE KIT) (PERIDEX) 0.12 % solution 15 mL, 15 mL, Mouth Rinse, BID, Candee Furbish, MD, 15 mL at 03/25/21 0854   Chlorhexidine  Gluconate Cloth 2 % PADS 6 each, 6 each, Topical, Daily, Brand Males, MD, 6 each at 03/26/21 0600   clindamycin (CLEOCIN) IVPB 900 mg, 900 mg, Intravenous, Q8H, Carlyle Basques, MD, Last Rate: 100 mL/hr at 03/26/21 1329, 900 mg at 03/26/21 1329   clonazePAM (KLONOPIN) tablet 0.5 mg, 0.5 mg, Oral, BID, Mannam, Praveen, MD, 0.5 mg at 03/26/21 1100   [START ON 03/28/2021] Darbepoetin Alfa (ARANESP) injection 200 mcg, 200 mcg, Subcutaneous, Q Sun-1800, Corliss Parish, MD   dextrose 5 % solution, , Intravenous, Continuous, Corliss Parish, MD, Last Rate: 50 mL/hr at 03/23/21 1300, Infusion Verify at 03/23/21 1300   docusate sodium (COLACE) capsule 100 mg, 100 mg, Oral, BID PRN, Mannam, Praveen, MD   dolutegravir (TIVICAY) tablet 50 mg, 50 mg, Oral, Daily, Mannam, Praveen, MD, 50 mg at 03/26/21 1100   gabapentin (NEURONTIN) capsule 100 mg, 100 mg, Oral, Q12H, Mannam, Praveen, MD, 100 mg at 03/26/21 1108   heparin injection 1,000-6,000 Units, 1,000-6,000 Units, CRRT, PRN, Roney Jaffe, MD, 2,400 Units at 03/19/21 0943   heparin injection 5,000 Units, 5,000 Units, Subcutaneous, Q8H, McQuaid, Douglas B, MD, 5,000 Units at 03/26/21 1314   insulin aspart (novoLOG) injection 0-15 Units, 0-15 Units, Subcutaneous, TID WC, Mannam, Praveen, MD, 2 Units at 03/26/21 1313   insulin aspart (novoLOG) injection 0-5 Units, 0-5 Units, Subcutaneous, QHS, Mannam, Praveen, MD   insulin glargine (LANTUS) injection 5 Units, 5 Units, Subcutaneous, Daily, Anders Simmonds, MD, 5 Units at 03/26/21 0933   ipratropium-albuterol (DUONEB) 0.5-2.5 (3) MG/3ML nebulizer solution 3 mL, 3 mL, Nebulization, TID, McQuaid, Douglas B, MD, 3 mL at 03/26/21 1350   labetalol (NORMODYNE) injection 10-20 mg, 10-20 mg, Intravenous, Q2H PRN, Kara Mead V, MD, 20 mg at 03/19/21 1658   lamiVUDine (EPIVIR) tablet 300 mg, 300 mg, Oral, Daily, Mannam, Praveen, MD, 300 mg at 03/26/21 0932   lidocaine (LIDODERM) 5 % 1 patch, 1 patch,  Transdermal, Q24H, McQuaid, Douglas B, MD, 1 patch at 03/25/21 1838   lip balm (CARMEX) ointment, , Topical, PRN, Simonne Maffucci B, MD   MEDLINE mouth rinse, 15 mL, Mouth Rinse, q12n4p, Mannam, Praveen, MD, 15 mL at 03/26/21 1300   midazolam (VERSED) injection 1-2 mg, 1-2 mg, Intravenous, Q2H PRN, Bowser, Grace E, NP, 2 mg at 03/24/21 0130   multivitamin with minerals tablet 1 tablet, 1 tablet, Oral, Daily, Mariel Aloe, MD, 1 tablet at 03/26/21 1313   ondansetron (ZOFRAN) injection 4 mg, 4 mg, Intravenous, Q6H PRN, Collier Bullock, MD, 4 mg at 03/24/21 2151   oxyCODONE (Oxy IR/ROXICODONE) immediate release tablet 5 mg, 5 mg, Oral, Q6H PRN, Mariel Aloe, MD, 5 mg at 03/26/21 1103   polyethylene glycol (MIRALAX / GLYCOLAX) packet 17 g, 17 g,  Oral, Daily PRN, Mannam, Praveen, MD   predniSONE (DELTASONE) tablet 20 mg, 20 mg, Oral, Q breakfast, Mannam, Praveen, MD, 20 mg at 03/26/21 0932   primaquine tablet 30 mg, 30 mg, Oral, Daily, Mannam, Praveen, MD, 30 mg at 03/26/21 0933   sodium chloride flush (NS) 0.9 % injection 10-40 mL, 10-40 mL, Intracatheter, Q12H, Christinia Gully B, MD, 10 mL at 03/26/21 1111   sodium chloride flush (NS) 0.9 % injection 10-40 mL, 10-40 mL, Intracatheter, PRN, Tanda Rockers, MD  Patients Current Diet:  Diet Order             DIET DYS 3 Room service appropriate? Yes; Fluid consistency: Honey Thick  Diet effective now                   Precautions / Restrictions Precautions Precautions: Fall Precaution Comments: trach with PMV, flexiseal, foley Restrictions Weight Bearing Restrictions: No   Has the patient had 2 or more falls or a fall with injury in the past year? Yes  Prior Activity Level Community (5-7x/wk): No DME used, but driving (likes to drive), walking dog, cutting grass. etc.  Prior Functional Level Self Care: Did the patient need help bathing, dressing, using the toilet or eating? Independent  Indoor Mobility: Did the patient need  assistance with walking from room to room (with or without device)? Independent  Stairs: Did the patient need assistance with internal or external stairs (with or without device)? Independent  Functional Cognition: Did the patient need help planning regular tasks such as shopping or remembering to take medications? [place] Devices/Equipment: Cane (specify quad or straight) (sometimes at home) Home Equipment: Kasandra Knudsen - single point  Prior Device Use: Indicate devices/aids used by the patient prior to current illness, exacerbation or injury? None of the above  Current Functional Level Cognition  Overall Cognitive Status: Impaired/Different from baseline Difficult to assess due to: Tracheostomy Current Attention Level: Selective Orientation Level: (P) Oriented X4 Following Commands: Follows one step commands inconsistently, Follows one step commands with increased time Safety/Judgement: Decreased awareness of safety, Decreased awareness of deficits General Comments: Has PMV but then removes it half way through    Extremity Assessment (includes Sensation/Coordination)  Upper Extremity Assessment: RUE deficits/detail, LUE deficits/detail RUE Deficits / Details: grip strength 3/5, wrist flexion 3/5, elbow flexion 2+/5, shoulder abduction 3-/5 RUE:  (unable to fully assess 2* CRRT/lines R side of neck) RUE Sensation:  (denies parasthesia) RUE Coordination: decreased fine motor, decreased gross motor LUE Deficits / Details: grip strength 3-/5, wrist flexion 3/5, elbow flexion 2+/5, shoulder abduction 2/5 LUE:  (Unable fully assess 2* multiple lines/trach) LUE Sensation:  (denies parasthesia) LUE Coordination: decreased fine motor, decreased gross motor  Lower Extremity Assessment: Defer to PT evaluation RLE Deficits / Details: trace toe and ankle, trance hip flex, abd/add knee extension, limited ankle  ROM, H/O ganglion cyst surgery LLE  Deficits / Details: same as right.    ADLs  Overall ADL's : Needs assistance/impaired Eating/Feeding: Supervision/ safety, Set up, Sitting Eating/Feeding Details (indicate cue type and reason): in recliner , using spoon to drink water post mouth care Grooming: Set up, Supervision/safety, Oral care, Sitting Grooming Details (indicate cue type and reason): in recliner Upper Body Bathing: Total assistance Lower Body Bathing: Total assistance Upper Body Dressing : Total assistance Lower Body Dressing: Total assistance Toilet Transfer: Minimal assistance, +2 for safety/equipment Toilet Transfer Details (indicate cue type and reason): sara stedy (bed>recliner) Toileting- Clothing Manipulation  and Hygiene: Total assistance Functional mobility during ADLs: Total assistance General ADL Comments: patient with significant weakness, has been intubated ~2 weeks currently requiring total care    Mobility  Overal bed mobility: Needs Assistance Bed Mobility: Rolling Rolling: Min guard Sidelying to sit: Mod assist Supine to sit: +2 for physical assistance, Max assist Sit to supine: +2 for physical assistance, Max assist Sit to sidelying: Mod assist General bed mobility comments: assisted to reposition on the bed including legs    Transfers  Overall transfer level: Needs assistance Equipment used: Ambulation equipment used Transfer via Lift Equipment:  (sara stedy) Transfers: Sit to/from Stand Sit to Stand: Min assist, From elevated surface General transfer comment: pt declined    Ambulation / Gait / Stairs / Office manager / Balance Dynamic Sitting Balance Sitting balance - Comments: Initially assist due to posterior lean but then able to maintain static Balance Overall balance assessment: Needs assistance Sitting-balance support: No upper extremity supported, Feet supported Sitting balance-Leahy Scale: Fair Sitting balance - Comments: Initially assist due to posterior  lean but then able to maintain static Standing balance support: Bilateral upper extremity supported Standing balance-Leahy Scale: Zero Standing balance comment: Stood x2 with sara stedy from bed    Special needs/care consideration Diabetic management yes   Previous Home Environment (from acute therapy documentation) Living Arrangements: Alone Available Help at Discharge: Family, Available PRN/intermittently Type of Home: Apartment Home Layout: One level Home Access: Stairs to enter Technical brewer of Steps: 2 Bathroom Shower/Tub: Multimedia programmer: Standard Home Care Services: No Additional Comments: girlfriend works as PCA but tries to be over at [employer] apartment "as much as I can." Plans to have toilet replaced with handicap height toilet  Discharge Living Setting Plans for Discharge Living Setting: Patient's home, Alone Type of Home at Discharge: Apartment Discharge Home Layout: One level Discharge Home Access: Stairs to enter Entrance Stairs-Rails: None (2 columns he can use to support) Entrance Stairs-Number of Steps: 2 Discharge Bathroom Shower/Tub: Walk-in shower Discharge Bathroom Toilet: Standard Discharge Bathroom Accessibility: Yes How Accessible: Accessible via walker Does the patient have any problems obtaining your medications?: No  Social/Family/Support Systems Patient Roles: Partner (has a girlfriend) Anticipated Caregiver: mom Taishawn Smaldone and other family members/friends Anticipated Caregiver's Contact Information: Hassan Rowan: [phone] Ability/Limitations of Caregiver: no physical care Caregiver Availability: Intermittent Discharge Plan Discussed with Primary Caregiver: Yes Is Caregiver In Agreement with Plan?: Yes Does Caregiver/Family have Issues with Lodging/Transportation while Pt is in Rehab?: No  Goals Patient/Family Goal for Rehab: PT/OT supervision to mod I, SLP mod I Expected length of stay: 12-14 days Additional  Information: new trach Pt/Family Agrees to Admission and willing to participate: Yes Program Orientation Provided & Reviewed with Pt/Caregiver Including Roles  & Responsibilities: Yes  Barriers to Discharge: Insurance for SNF coverage, Decreased caregiver support  Decrease burden of Care through IP rehab admission: n/a  Possible need for SNF placement upon discharge: Not anticipated.   Patient Condition: I have reviewed medical records from [facility], spoken with CM, and patient and family member. I met with patient at the bedside for inpatient rehabilitation assessment.  Patient will benefit from ongoing PT, OT, and SLP, can actively participate in 3 hours of therapy a day 5 days of the week, and can make measurable gains during the admission.  Patient will also benefit from the coordinated team approach during an Inpatient Acute Rehabilitation admission.  The patient will receive intensive therapy as well as  Rehabilitation physician, nursing, social worker, and care management interventions.  Due to bladder management, bowel management, safety, skin/wound care, disease management, medication administration, pain management, and patient education the patient requires 24 hour a day rehabilitation nursing.  The patient is currently min to max assist with mobility and basic ADLs.  Discharge setting and therapy post discharge at home with home health is anticipated.  Patient has agreed to participate in the Acute Inpatient Rehabilitation Program and will admit today.  Preadmission Screen Completed By:  Michel Santee, PT, DPT 03/26/2021 4:11 PM ______________________________________________________________________   Discussed status with Dr. Dagoberto Ligas on 03/30/21  at 12:37 PM  and received approval for admission today.  Admission Coordinator:  Michel Santee, PT, time 12:37 PM Sudie Grumbling 03/30/21    Assessment/Plan: Diagnosis: Does the need for close, 24 hr/day Medical supervision in concert with the  patient's rehab needs make it unreasonable for this patient to be served in a less intensive setting? Yes Co-Morbidities requiring supervision/potential complications: PCP PNA; AKI- improved- s/p CRRT; trach s/p decannulation today; recurrent anemia s/p multiple transfusions; gout in B/L feet Due to bladder management, bowel management, safety, skin/wound care, disease management, medication administration, pain management, and patient education, does the patient require 24 hr/day rehab nursing? Yes Does the patient require coordinated care of a physician, rehab nurse, PT, OT, and SLP to address physical and functional deficits in the context of the above medical diagnosis(es)? Yes Addressing deficits in the following areas: balance, endurance, locomotion, strength, transferring, bowel/bladder control, bathing, dressing, feeding, grooming, toileting, speech, language, and swallowing Can the patient actively participate in an intensive therapy program of at least 3 hrs of therapy 5 days a week? Yes The potential for patient to make measurable gains while on inpatient rehab is good Anticipated functional outcomes upon discharge from inpatient rehab: modified independent and supervision PT, modified independent and supervision OT, modified independent SLP Estimated rehab length of stay to reach the above functional goals is: 12-14 days Anticipated discharge destination: Home 10. Overall Rehab/Functional Prognosis: good   MD Signature:

## 2021-03-26 NOTE — Progress Notes (Signed)
Subjective: No acute events overnight. Patient reports he continues to have swelling in his bilateral feet and pain in the L ankle. He reports this has been present for sometime. He denies chills, chest pain, difficulty breathing, abdominal pain, dysuria.    Antibiotics:  Anti-infectives (From admission, onward)    Start     Dose/Rate Route Frequency Ordered Stop   03/23/21 1000  linezolid (ZYVOX) tablet 600 mg        600 mg Oral Every 12 hours 03/23/21 0002 03/24/21 2151   03/23/21 1000  primaquine tablet 30 mg        30 mg Oral Daily 03/23/21 0002     03/23/21 1000  dolutegravir (TIVICAY) tablet 50 mg        50 mg Oral Daily 03/23/21 0002     03/22/21 1545  lamiVUDine (EPIVIR) tablet 300 mg        300 mg Oral Daily 03/22/21 1446     03/21/21 1430  lamiVUDine (EPIVIR) 10 MG/ML solution 300 mg  Status:  Discontinued        300 mg Per Tube Daily 03/21/21 1330 03/22/21 1446   03/18/21 1530  linezolid (ZYVOX) tablet 600 mg  Status:  Discontinued        600 mg Per Tube Every 12 hours 03/18/21 1431 03/23/21 0002   03/18/21 1515  linezolid (ZYVOX) 100 MG/5ML suspension 600 mg  Status:  Discontinued        600 mg Per Tube Every 12 hours 03/18/21 1427 03/18/21 1431   03/17/21 1000  primaquine tablet 30 mg  Status:  Discontinued        30 mg Per Tube Daily 03/17/21 0856 03/23/21 0002   03/16/21 1000  ceFAZolin (ANCEF) IVPB 1 g/50 mL premix  Status:  Discontinued        1 g 100 mL/hr over 30 Minutes Intravenous Every 12 hours 03/15/21 1251 03/15/21 1301   03/16/21 1000  ceFAZolin (ANCEF) 2 g in sodium chloride 0.9 % 100 mL IVPB  Status:  Discontinued        2 g 200 mL/hr over 30 Minutes Intravenous Every 12 hours 03/15/21 1302 03/18/21 1427   03/13/21 2200  fluconazole (DIFLUCAN) IVPB 400 mg        400 mg 100 mL/hr over 120 Minutes Intravenous Every 24 hours 03/13/21 1047 03/19/21 0031   03/13/21 1800  acyclovir (ZOVIRAX) 900 mg in dextrose 5 % 150 mL IVPB  Status:  Discontinued         900 mg 168 mL/hr over 60 Minutes Intravenous Every 24 hours 03/13/21 0958 03/13/21 1047   03/13/21 1800  acyclovir (ZOVIRAX) 900 mg in dextrose 5 % 150 mL IVPB  Status:  Discontinued        900 mg 168 mL/hr over 60 Minutes Intravenous Every 12 hours 03/13/21 1047 03/14/21 1700   03/13/21 1000  emtricitabine-tenofovir AF (DESCOVY) 200-25 MG per tablet 1 tablet  Status:  Discontinued        1 tablet Per Tube Daily 03/12/21 1350 03/21/21 1330   03/12/21 2200  vancomycin (VANCOREADY) IVPB 750 mg/150 mL  Status:  Discontinued        750 mg 150 mL/hr over 60 Minutes Intravenous Every 12 hours 03/12/21 1249 03/12/21 1806   03/12/21 2200  clindamycin (CLEOCIN) IVPB 900 mg        900 mg 100 mL/hr over 30 Minutes Intravenous Every 8 hours 03/12/21 1729     03/12/21  1815  primaquine tablet 30 mg  Status:  Discontinued        30 mg Oral Daily 03/12/21 1729 03/17/21 0856   03/12/21 1400  vancomycin (VANCOREADY) IVPB 1000 mg/200 mL        1,000 mg 200 mL/hr over 60 Minutes Intravenous  Once 03/12/21 1249 03/12/21 1540   03/12/21 1400  acyclovir (ZOVIRAX) 900 mg in dextrose 5 % 150 mL IVPB  Status:  Discontinued        900 mg 168 mL/hr over 60 Minutes Intravenous Every 12 hours 03/12/21 1249 03/13/21 0958   03/12/21 1300  cefTRIAXone (ROCEPHIN) 2 g in sodium chloride 0.9 % 100 mL IVPB  Status:  Discontinued        2 g 200 mL/hr over 30 Minutes Intravenous Every 12 hours 03/12/21 1203 03/15/21 1251   03/10/21 2200  fluconazole (DIFLUCAN) IVPB 200 mg  Status:  Discontinued        200 mg 100 mL/hr over 60 Minutes Intravenous Every 24 hours 03/10/21 1333 03/13/21 1047   03/10/21 1800  ceFAZolin (ANCEF) IVPB 2g/100 mL premix  Status:  Discontinued        2 g 200 mL/hr over 30 Minutes Intravenous Every 8 hours 03/10/21 1325 03/12/21 1203   03/09/21 2200  ceFAZolin (ANCEF) IVPB 2g/100 mL premix  Status:  Discontinued        2 g 200 mL/hr over 30 Minutes Intravenous Every 12 hours 03/09/21 1445  03/10/21 1325   03/09/21 2000  ceFAZolin (ANCEF) IVPB 2g/100 mL premix  Status:  Discontinued        2 g 200 mL/hr over 30 Minutes Intravenous Every 8 hours 03/09/21 1440 03/09/21 1445   03/09/21 1645  dapsone tablet 100 mg  Status:  Discontinued        100 mg Per Tube Daily 03/09/21 1556 03/12/21 1729   03/07/21 2200  primaquine tablet 30 mg  Status:  Discontinued        30 mg Per Tube Every 24 hours 03/07/21 0915 03/09/21 1439   03/07/21 1000  dolutegravir (TIVICAY) tablet 50 mg  Status:  Discontinued        50 mg Per Tube Daily 03/06/21 1132 03/23/21 0002   03/07/21 1000  lamiVUDine (EPIVIR) tablet 150 mg  Status:  Discontinued        150 mg Per Tube Daily 03/06/21 1324 03/12/21 1350   03/06/21 2200  fluconazole (DIFLUCAN) IVPB 400 mg  Status:  Discontinued        400 mg 100 mL/hr over 120 Minutes Intravenous Every 24 hours 03/06/21 1251 03/10/21 1333   03/06/21 1400  ceFEPIme (MAXIPIME) 2 g in sodium chloride 0.9 % 100 mL IVPB  Status:  Discontinued        2 g 200 mL/hr over 30 Minutes Intravenous Every 12 hours 03/06/21 1251 03/09/21 1439   03/05/21 2130  fluconazole (DIFLUCAN) IVPB 200 mg  Status:  Discontinued        200 mg 100 mL/hr over 60 Minutes Intravenous Every 24 hours 03/05/21 0810 03/06/21 1251   03/05/21 1000  dolutegravir (TIVICAY) tablet 50 mg  Status:  Discontinued        50 mg Oral Daily 03/04/21 2214 03/06/21 1132   03/05/21 1000  lamiVUDine (EPIVIR) tablet 300 mg  Status:  Discontinued        300 mg Oral Daily 03/04/21 2217 03/04/21 2217   03/05/21 1000  lamiVUDine (EPIVIR) tablet 150 mg  Status:  Discontinued  150 mg Oral Daily 03/04/21 2217 03/06/21 1324   03/04/21 2200  clindamycin (CLEOCIN) IVPB 900 mg  Status:  Discontinued        900 mg 100 mL/hr over 30 Minutes Intravenous Every 8 hours 03/04/21 2157 03/09/21 1439   03/04/21 2200  primaquine tablet 30 mg  Status:  Discontinued        30 mg Oral Every 24 hours 03/04/21 2157 03/07/21 0915   03/04/21  2145  vancomycin (VANCOREADY) IVPB 1500 mg/300 mL        1,500 mg 150 mL/hr over 120 Minutes Intravenous  Once 03/04/21 2144 03/05/21 0050   03/04/21 2145  ceFEPIme (MAXIPIME) 2 g in sodium chloride 0.9 % 100 mL IVPB  Status:  Discontinued        2 g 200 mL/hr over 30 Minutes Intravenous Every 24 hours 03/04/21 2144 03/06/21 1251   03/04/21 2143  vancomycin variable dose per unstable renal function (pharmacist dosing)  Status:  Discontinued         Does not apply See admin instructions 03/04/21 2144 03/06/21 0813   03/04/21 2130  fluconazole (DIFLUCAN) IVPB 400 mg  Status:  Discontinued        400 mg 100 mL/hr over 120 Minutes Intravenous Every 24 hours 03/04/21 2126 03/05/21 0810   03/04/21 1915  cefTRIAXone (ROCEPHIN) 1 g in sodium chloride 0.9 % 100 mL IVPB        1 g 200 mL/hr over 30 Minutes Intravenous  Once 03/04/21 1914 03/04/21 2045   03/04/21 1915  azithromycin (ZITHROMAX) 500 mg in sodium chloride 0.9 % 250 mL IVPB        500 mg 250 mL/hr over 60 Minutes Intravenous  Once 03/04/21 1914 03/04/21 2201       Medications: Scheduled Meds:  amLODipine  10 mg Oral Daily   chlorhexidine gluconate (MEDLINE KIT)  15 mL Mouth Rinse BID   Chlorhexidine Gluconate Cloth  6 each Topical Daily   clonazePAM  0.5 mg Oral BID   [START ON 03/28/2021] darbepoetin (ARANESP) injection - NON-DIALYSIS  200 mcg Subcutaneous Q Sun-1800   dolutegravir  50 mg Oral Daily   gabapentin  100 mg Oral Q12H   heparin injection (subcutaneous)  5,000 Units Subcutaneous Q8H   insulin aspart  0-15 Units Subcutaneous TID WC   insulin aspart  0-5 Units Subcutaneous QHS   insulin glargine  5 Units Subcutaneous Daily   ipratropium-albuterol  3 mL Nebulization TID   lamiVUDine  300 mg Oral Daily   lidocaine  1 patch Transdermal Q24H   mouth rinse  15 mL Mouth Rinse q12n4p   multivitamin with minerals  1 tablet Oral Daily   predniSONE  20 mg Oral Q breakfast   primaquine  30 mg Oral Daily   sodium chloride  flush  10-40 mL Intracatheter Q12H   Continuous Infusions:  sodium chloride 250 mL (03/21/21 1408)   sodium chloride 250 mL (03/25/21 0345)   clindamycin (CLEOCIN) IV 900 mg (03/26/21 0500)   dextrose 50 mL/hr at 03/23/21 1300   PRN Meds:.acetaminophen, albuterol, docusate sodium, heparin, labetalol, lip balm, midazolam, ondansetron (ZOFRAN) IV, oxyCODONE, polyethylene glycol, sodium chloride flush    Objective: Weight change:   Intake/Output Summary (Last 24 hours) at 03/26/2021 1312 Last data filed at 03/26/2021 0547 Gross per 24 hour  Intake --  Output 2075 ml  Net -2075 ml    Blood pressure (!) 148/89, pulse 89, temperature 98.3 F (36.8 C), temperature source Oral, resp. rate 19,  height 5' 10.98" (1.803 m), weight 79.6 kg, SpO2 100 %. Temp:  [98.3 F (36.8 C)-99.5 F (37.5 C)] 98.3 F (36.8 C) (07/22 1146) Pulse Rate:  [83-110] 89 (07/22 1146) Resp:  [12-22] 19 (07/22 1146) BP: (110-160)/(63-107) 148/89 (07/22 1146) SpO2:  [98 %-100 %] 100 % (07/22 1146) FiO2 (%):  [28 %] 28 % (07/22 0831)  Physical Exam: Physical Exam Constitutional:      Appearance: He is well-developed. He is ill-appearing.  HENT:     Head: Normocephalic and atraumatic.  Eyes:     General: No scleral icterus.       Right eye: No discharge.        Left eye: No discharge.     Conjunctiva/sclera: Conjunctivae normal.  Neck:     Trachea: Tracheostomy present.  Cardiovascular:     Rate and Rhythm: Normal rate and regular rhythm.     Heart sounds: No murmur heard. Pulmonary:     Effort: Pulmonary effort is normal. No respiratory distress.     Breath sounds: No stridor. Rhonchi present. No wheezing.  Abdominal:     General: There is no distension.     Palpations: Abdomen is soft.  Musculoskeletal:     Cervical back: Normal range of motion and neck supple.     Right foot: Tenderness present.     Left foot: Tenderness present.  Skin:    General: Skin is warm and dry.  Neurological:      General: No focal deficit present.     Mental Status: He is alert and oriented to person, place, and time.  Psychiatric:        Mood and Affect: Mood normal.        Behavior: Behavior normal.        Thought Content: Thought content normal.        Judgment: Judgment normal.     CBC:    BMET Recent Labs    03/25/21 0148 03/26/21 0247  NA 138 139  K 3.8 3.6  CL 108 107  CO2 20* 23  GLUCOSE 103* 85  BUN 55* 34*  CREATININE 1.54* 1.40*  CALCIUM 9.0 8.9      Liver Panel  Recent Labs    03/25/21 0148 03/26/21 0247  ALBUMIN 2.1* 2.1*        Sedimentation Rate No results for input(s): ESRSEDRATE in the last 72 hours. C-Reactive Protein No results for input(s): CRP in the last 72 hours.  Micro Results: Recent Results (from the past 720 hour(s))  Culture, blood (routine x 2)     Status: None   Collection Time: 03/04/21  6:14 PM   Specimen: BLOOD LEFT FOREARM  Result Value Ref Range Status   Specimen Description   Final    BLOOD LEFT FOREARM Performed at [place] [facility] [name], [place], [place] [address]    Special Requests   Final    BOTTLES DRAWN AEROBIC AND ANAEROBIC Blood Culture adequate volume Performed at [facility] [address] [place], [place] [address]    Culture   Final    NO GROWTH 5 DAYS Performed at [facility], [place] [address] [place], [place] [address]    Report Status 03/09/2021 FINAL  Final  Resp Panel by RT-PCR (Flu A&B, Covid) Nasopharyngeal Swab     Status: None   Collection Time: 03/04/21  7:52 PM   Specimen: Nasopharyngeal Swab; Nasopharyngeal(NP) swabs in vial transport medium  Result Value Ref Range Status  SARS Coronavirus 2 by RT PCR NEGATIVE NEGATIVE Final    Comment: (NOTE) SARS-CoV-2 target nucleic acids are NOT DETECTED.  The SARS-CoV-2 RNA is generally detectable in upper respiratory specimens during the acute phase of infection. The lowest concentration of  SARS-CoV-2 viral copies this assay can detect is 138 copies/mL. A negative result does not preclude SARS-Cov-2 infection and should not be used as the sole basis for treatment or other patient management decisions. A negative result may occur with  improper specimen collection/handling, submission of specimen other than nasopharyngeal swab, presence of viral mutation(s) within the areas targeted by this assay, and inadequate number of viral copies(<138 copies/mL). A negative result must be combined with clinical observations, patient history, and epidemiological information. The expected result is Negative.  Fact Sheet for Patients:  EntrepreneurPulse.com.au  Fact Sheet for Healthcare Providers:  IncredibleEmployment.be  This test is no t yet approved or cleared by the [place] FDA and  has been authorized for detection and/or diagnosis of SARS-CoV-2 by FDA under an Emergency Use Authorization (EUA). This EUA will remain  in effect (meaning this test can be used) for the duration of the COVID-19 declaration under Section 564(b)(1) of the Act, 21 U.S.C.section 360bbb-3(b)(1), unless the authorization is terminated  or revoked sooner.       Influenza A by PCR NEGATIVE NEGATIVE Final   Influenza B by PCR NEGATIVE NEGATIVE Final    Comment: (NOTE) The Xpert Xpress SARS-CoV-2/FLU/RSV plus assay is intended as an aid in the diagnosis of influenza from Nasopharyngeal swab specimens and should not be used as a sole basis for treatment. Nasal washings and aspirates are unacceptable for Xpert Xpress SARS-CoV-2/FLU/RSV testing.  Fact Sheet for Patients: EntrepreneurPulse.com.au  Fact Sheet for Healthcare Providers: IncredibleEmployment.be  This test is not yet approved or cleared by the [place] FDA and has been authorized for detection and/or diagnosis of SARS-CoV-2 by FDA under an Emergency Use  Authorization (EUA). This EUA will remain in effect (meaning this test can be used) for the duration of the COVID-19 declaration under Section 564(b)(1) of the Act, 21 U.S.C. section 360bbb-3(b)(1), unless the authorization is terminated or revoked.  Performed at [facility], [place] [address] [place], [place] [address]   MRSA PCR Screening     Status: None   Collection Time: 03/05/21 10:41 AM  Result Value Ref Range Status   MRSA by PCR NEGATIVE NEGATIVE Final    Comment:        The GeneXpert MRSA Assay (FDA approved for NASAL specimens only), is one component of a comprehensive MRSA colonization surveillance program. It is not intended to diagnose MRSA infection nor to guide or monitor treatment for MRSA infections. Performed at [facility], [place] [address], [place], [place] [address]   Respiratory (~20 pathogens) panel by PCR     Status: None   Collection Time: 03/05/21 10:42 AM   Specimen: Nasopharyngeal Swab; Respiratory  Result Value Ref Range Status   Adenovirus NOT DETECTED NOT DETECTED Final   Coronavirus 229E NOT DETECTED NOT DETECTED Final    Comment: (NOTE) The Coronavirus on the Respiratory Panel, DOES NOT test for the novel  Coronavirus (2019 nCoV)    Coronavirus HKU1 NOT DETECTED NOT DETECTED Final   Coronavirus NL63 NOT DETECTED NOT DETECTED Final   Coronavirus OC43 NOT DETECTED NOT DETECTED Final   Metapneumovirus NOT DETECTED NOT DETECTED Final   Rhinovirus / Enterovirus NOT DETECTED NOT DETECTED Final   Influenza A NOT DETECTED NOT  DETECTED Final   Influenza B NOT DETECTED NOT DETECTED Final   Parainfluenza Virus 1 NOT DETECTED NOT DETECTED Final   Parainfluenza Virus 2 NOT DETECTED NOT DETECTED Final   Parainfluenza Virus 3 NOT DETECTED NOT DETECTED Final   Parainfluenza Virus 4 NOT DETECTED NOT DETECTED Final   Respiratory Syncytial Virus NOT DETECTED NOT DETECTED Final   Bordetella pertussis NOT DETECTED NOT  DETECTED Final   Bordetella Parapertussis NOT DETECTED NOT DETECTED Final   Chlamydophila pneumoniae NOT DETECTED NOT DETECTED Final   Mycoplasma pneumoniae NOT DETECTED NOT DETECTED Final    Comment: Performed at [facility], [place] [address] [place], [place] [address]  Gastrointestinal Panel by PCR , Stool     Status: None   Collection Time: 03/05/21  6:28 PM   Specimen: Stool  Result Value Ref Range Status   Campylobacter species NOT DETECTED NOT DETECTED Final   Plesimonas shigelloides NOT DETECTED NOT DETECTED Final   Salmonella species NOT DETECTED NOT DETECTED Final   Yersinia enterocolitica NOT DETECTED NOT DETECTED Final   Vibrio species NOT DETECTED NOT DETECTED Final   Vibrio cholerae NOT DETECTED NOT DETECTED Final   Enteroaggregative E coli (EAEC) NOT DETECTED NOT DETECTED Final   Enteropathogenic E coli (EPEC) NOT DETECTED NOT DETECTED Final   Enterotoxigenic E coli (ETEC) NOT DETECTED NOT DETECTED Final   Shiga like toxin producing E coli (STEC) NOT DETECTED NOT DETECTED Final   Shigella/Enteroinvasive E coli (EIEC) NOT DETECTED NOT DETECTED Final   Cryptosporidium NOT DETECTED NOT DETECTED Final   Cyclospora cayetanensis NOT DETECTED NOT DETECTED Final   Entamoeba histolytica NOT DETECTED NOT DETECTED Final   Giardia lamblia NOT DETECTED NOT DETECTED Final   Adenovirus F40/41 NOT DETECTED NOT DETECTED Final   Astrovirus NOT DETECTED NOT DETECTED Final   Norovirus GI/GII NOT DETECTED NOT DETECTED Final   Rotavirus A NOT DETECTED NOT DETECTED Final   Sapovirus (I, II, IV, and V) NOT DETECTED NOT DETECTED Final    Comment: Performed at [facility], [place] [place], [place] [address]  Pneumocystis smear by DFA     Status: None   Collection Time: 03/06/21  1:09 PM   Specimen: Sputum; Respiratory  Result Value Ref Range Status   Specimen Source-PJSRC SPUTUM  Final   Pneumocystis jiroveci Ag NEGATIVE  Final    Comment: Performed at [place] Performed at [place] [facility] Performed at [facility], [place] [address] [place], [place] [address]   Culture, Respiratory w Gram Stain     Status: None   Collection Time: 03/06/21  1:10 PM   Specimen: Tracheal Aspirate; Respiratory  Result Value Ref Range Status   Specimen Description   Final    TRACHEAL ASPIRATE Performed at [facility] [address] [place], [place] [address]    Special Requests   Final    Immunocompromised Performed at [facility], [place] [address], [place], [place] [address]    Gram Stain   Final    FEW WBC PRESENT,BOTH PMN AND MONONUCLEAR RARE SQUAMOUS EPITHELIAL CELLS PRESENT RARE GRAM POSITIVE RODS RARE GRAM POSITIVE COCCI IN PAIRS Performed at [facility], [place] [address] [place],  [address]    Culture RARE STAPHYLOCOCCUS AUREUS  Final   Report Status 03/09/2021 FINAL  Final   Organism ID, Bacteria STAPHYLOCOCCUS AUREUS  Final      Susceptibility   Staphylococcus aureus -  MIC*    CIPROFLOXACIN <=0.5 SENSITIVE Sensitive     ERYTHROMYCIN <=0.25 SENSITIVE Sensitive     GENTAMICIN <=0.5 SENSITIVE Sensitive     OXACILLIN <=0.25 SENSITIVE Sensitive     TETRACYCLINE >=16 RESISTANT Resistant     VANCOMYCIN <=0.5 SENSITIVE Sensitive     TRIMETH/SULFA <=10 SENSITIVE Sensitive     CLINDAMYCIN <=0.25 SENSITIVE Sensitive     RIFAMPIN <=0.5 SENSITIVE Sensitive     Inducible Clindamycin NEGATIVE Sensitive     * RARE STAPHYLOCOCCUS AUREUS  Blastomyces Antigen     Status: None   Collection Time: 03/12/21  5:55 PM   Specimen: Blood  Result Value Ref Range Status   Blastomyces Antigen None Detected None Detected ng/mL Final    Comment: (NOTE) Results reported as ng/mL in 0.2 - 14.7 ng/mL range Results above the limit of detection but below 0.2 ng/mL are reported as 'Positive, Below the Limit of Quantification' Results above 14.7 ng/mL are reported as  'Positive, Above the Limit of Quantification'    Specimen Type SERUM  Final    Comment: (NOTE) Performed At: [facility] [place], [place] [id number] Bruce Donath MD [id number]   Virus culture     Status: None   Collection Time: 03/12/21  7:08 PM   Specimen: CSF  Result Value Ref Range Status   Viral Culture No virus isolated.  Corrected    Comment: (NOTE) Performed At: [facility] [address] [place], [place] [id number] Rush Farmer MD [id number] CORRECTED ON 07/20 AT 3818: PREVIOUSLY REPORTED AS Comment    Source of Sample BLOOD  Final    Comment: Performed at [facility] [address], [place], [place] [address]  Virus culture     Status: None   Collection Time: 03/13/21  4:42 PM   Specimen: PATH Cytology CSF; Cerebrospinal Fluid  Result Value Ref Range Status   Viral Culture No virus isolated.  Corrected    Comment: (NOTE) Please indicate source of specimen on all future request forms. Performed At: [facility] [address] [place], [place] [id number] Rush Farmer MD [id number] CORRECTED ON 07/19 AT 1036: PREVIOUSLY REPORTED AS Comment    Source of Sample CSF  Final    Comment: Performed at [facility], [place] [address] [place], [place] [address]  CSF culture w Gram Stain     Status: None   Collection Time: 03/13/21  4:42 PM   Specimen: PATH Cytology CSF; Cerebrospinal Fluid  Result Value Ref Range Status   Specimen Description   Final    CSF Performed at [facility] [address], [place], [place] [address]    Special Requests   Final    NONE Performed at [facility], [place] [address], [place], [place] [address]    Gram Stain   Final    NO ORGANISMS SEEN Gram Stain Report Called to,Read Back By and Verified With: ETHAN RN AT 5400 ON 03/13/21 BY S.VANHOORNE Performed at [facility], [place] [address], [place], [place] [address]    Culture   Final    NO GROWTH 3 DAYS Performed at [facility], [place] [address], [place], [place] [address]    Report Status 03/17/2021 FINAL  Final  Fungus Culture With Stain     Status: Abnormal   Collection Time: 03/13/21  4:42 PM   Specimen: PATH Cytology CSF; Cerebrospinal Fluid  Result Value Ref Range Status  Fungus Stain QNSTST (A)  Final    Comment: (NOTE) Test not performed. Insufficient specimen to perform or complete analysis.      Darrick Meigs S. was notified 03/17/2021.    Fungus (Mycology) Culture QNSTST (A)  Final    Comment: (NOTE) Test not performed. Insufficient specimen to perform or complete analysis.      Darrick Meigs S. was notified 03/17/2021. Performed At: [facility] [place], [place] [id number] Rush Farmer MD [id number]    Fungal Source CSF  Final    Comment: Performed at [facility] [address], [place], [place] [address]  Culture, fungus without smear     Status: None (Preliminary result)   Collection Time: 03/13/21  4:42 PM   Specimen: PATH Cytology CSF; Cerebrospinal Fluid  Result Value Ref Range Status   Specimen Description   Final    CSF Performed at [facility] [address], [place], [place] [address]    Special Requests   Final    NONE Performed at [facility], [place] [address] [place], [place] [address]    Culture   Final    NO FUNGUS ISOLATED AFTER 13 DAYS Performed at [facility], [place] [address], [place], [place] [address]    Report Status PENDING  Incomplete  Culture, Respiratory w Gram Stain     Status: None   Collection Time: 03/14/21  3:15 AM   Specimen: Tracheal Aspirate; Respiratory  Result Value Ref Range Status   Specimen Description   Final    TRACHEAL ASPIRATE Performed at [facility] [address], [place], [place] [address]    Special Requests   Final     NONE Performed at [facility], [place] [address] [place], [place] [address]    Gram Stain   Final    MODERATE WBC PRESENT,BOTH PMN AND MONONUCLEAR NO ORGANISMS SEEN    Culture   Final    NO GROWTH 2 DAYS Performed at [facility], [place] [address] [place], [place] [address]    Report Status 03/16/2021 FINAL  Final  Culture, Respiratory w Gram Stain     Status: None   Collection Time: 03/16/21  9:40 AM   Specimen: Bronchoalveolar Lavage; Respiratory  Result Value Ref Range Status   Specimen Description   Final    BRONCHIAL ALVEOLAR LAVAGE Performed at [facility] [address], [place], [place] [address]    Special Requests   Final    NONE Performed at [facility], [place] [address], [place], [place] [address]    Gram Stain   Final    FEW WBC PRESENT,BOTH PMN AND MONONUCLEAR NO ORGANISMS SEEN    Culture   Final    RARE Normal respiratory flora-no Staph aureus or Pseudomonas seen Performed at [facility], [address] [place], [place] [address]    Report Status 03/18/2021 FINAL  Final  Fungus Culture With Stain     Status: None (Preliminary result)   Collection Time: 03/16/21  9:40 AM   Specimen: Bronchoalveolar Lavage  Result Value Ref Range Status   Fungus Stain Final report  Final    Comment: (NOTE) Performed At: [facility] [place], [place] [id number] Rush Farmer MD [id number]    Fungus (Mycology) Culture PENDING  Incomplete   Fungal Source BRONCHIAL ALVEOLAR LAVAGE  Final    Comment: Performed at [facility] [place]  [address], [place], [place] [address]  Pneumocystis smear by DFA     Status: None   Collection Time: 03/16/21  9:40 AM   Specimen: Bronchoalveolar Lavage; Respiratory  Result Value Ref Range Status   Specimen Source-PJSRC BRONCHIAL ALVEOLAR LAVAGE  Final   Pneumocystis jiroveci Ag NEGATIVE  Final    Comment: Performed at [place] [address] [place], [place] [address]  Fungus Culture Result     Status: None   Collection Time: 03/16/21  9:40 AM  Result Value Ref Range Status   Result 1 Comment  Final    Comment: (NOTE) KOH/Calcofluor preparation:  no fungus observed. Performed At: [facility] [place], [place] [id number] Rush Farmer MD [id number]   Fungus Culture With Stain     Status: None (Preliminary result)   Collection Time: 03/16/21  9:41 AM   Specimen: Bronchoalveolar Lavage  Result Value Ref Range Status   Fungus Stain Final report  Final    Comment: (NOTE) Performed At: [facility] [address] [place], [place] [id number] Rush Farmer MD [id number]    Fungus (Mycology) Culture PENDING  Incomplete   Fungal Source BRONCHIAL ALVEOLAR LAVAGE  Final    Comment: Performed at [facility], [place] [address] [place], [place] [address]  Culture, Respiratory w Gram Stain     Status: None   Collection Time: 03/16/21  9:41 AM   Specimen: Bronchoalveolar Lavage; Respiratory  Result Value Ref Range Status   Specimen Description   Final    BRONCHIAL ALVEOLAR LAVAGE Performed at [place] [address], [place], [place] [address]    Special Requests   Final    NONE Performed at [facility] [place] [address] [place], [place] [address]    Gram Stain   Final    NO WBC SEEN NO ORGANISMS SEEN Performed at [facility], [place] [address], [place],  [address]    Culture RARE STAPHYLOCOCCUS EPIDERMIDIS  Final   Report Status 03/18/2021 FINAL  Final   Organism ID, Bacteria STAPHYLOCOCCUS EPIDERMIDIS  Final      Susceptibility   Staphylococcus epidermidis - MIC*    CIPROFLOXACIN 4 RESISTANT Resistant     ERYTHROMYCIN >=8 RESISTANT Resistant     GENTAMICIN <=0.5 SENSITIVE Sensitive     OXACILLIN >=4 RESISTANT Resistant     TETRACYCLINE >=16 RESISTANT Resistant      VANCOMYCIN 1 SENSITIVE Sensitive     TRIMETH/SULFA 40 SENSITIVE Sensitive     CLINDAMYCIN >=8 RESISTANT Resistant     RIFAMPIN 2 INTERMEDIATE Intermediate     Inducible Clindamycin NEGATIVE Sensitive     * RARE STAPHYLOCOCCUS EPIDERMIDIS  Fungus Culture Result     Status: None   Collection Time: 03/16/21  9:41 AM  Result Value Ref Range Status   Result 1 Comment  Final    Comment: (NOTE) KOH/Calcofluor preparation:  no fungus observed. Performed At: [facility] [place], [place] [id number] Rush Farmer MD [id number]     Studies/Results: No results found.    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 male with HIV/AIDS successfully suppressed on Dovato after initial period of imperfect adherence, admission with what became VDRF, and ARF in context of sepsis  #1 Respiratory failure  O2 requirement is stable. Patient will complete a 21 day course of consecutive days of PCP treatment (including steroids)  S/p treatment for MSSA and MRSE VAP  Continue aggressive pulmonary toilet  #2 Acute renal failure: he had been on CVVHD but has since had continue kidney function recovery.   #3 HIV/AIDS : continue Dovato  #4 L Foot pain: Patient says he has chronic gout bilaterally but current foot pain likely not related to gout. He has no erythema bilaterally but his feet are swollen  bilaterally.  -Will obtain MRI of the L foot to look for possible etiology of patient's pain   LOS: 22 days   Rick Duff 03/26/2021, 1:12 PM

## 2021-03-26 NOTE — Progress Notes (Signed)
Occupational Therapy Treatment Patient Details Name: Bruce Hunter MRN: [id number] DOB: April 24, 1976 Today's Date: 03/26/2021    History of present illness Pt adm to [facility] 6/30 with acute hypoxic respiratory failure with sepsis. Pt with pneumocystis carinii pneumonia. Pt intubated 7/1 and trached 7/13. Remains on trach collar. Pt on CRRT 7/2- 7/15. Transferred to [facility] on 7/16.  PMH - HIV/AIDS diagnosed 02/2021, CKD, asthma, obesity, DM2   OT comments  This 45 yo male admitted with above and seen today to look at arm strength, bed mobility, sitting balance, sit<>stand, transfers, and self feeding. Pt did really well today needing setup/S- min A +2 for these activities. He will continue to benefit from acute OT with follow up on CIR now recommended due to patient's progress.  Follow Up Recommendations  CIR;Supervision - Intermittent    Equipment Recommendations  Other (comment) (TBD next venue)       Precautions / Restrictions Precautions Precautions: Fall Precaution Comments: trach with PSMV, flexiseal, foley Restrictions Weight Bearing Restrictions: No       Mobility Bed Mobility Overal bed mobility: Needs Assistance Bed Mobility: Rolling;Sidelying to Sit Rolling: Min guard Sidelying to sit: Mod assist            Transfers Overall transfer level: Needs assistance   Transfers: Sit to/from Stand Sit to Stand: Min assist;From elevated surface         General transfer comment: use of sara stedy    Balance Overall balance assessment: Needs assistance Sitting-balance support: No upper extremity supported;Feet supported Sitting balance-Leahy Scale: Fair     Standing balance support: Bilateral upper extremity supported Standing balance-Leahy Scale: Zero Standing balance comment: Stood x2 with sara stedy from bed                           ADL either performed or assessed with clinical judgement   ADL Overall ADL's : Needs assistance/impaired Eating/Feeding:  Supervision/ safety;Set up;Sitting Eating/Feeding Details (indicate cue type and reason): in recliner , using spoon to drink water post mouth care Grooming: Set up;Supervision/safety;Oral care;Sitting Grooming Details (indicate cue type and reason): in recliner                 Toilet Transfer: Minimal assistance;+2 for safety/equipment Toilet Transfer Details (indicate cue type and reason): sara stedy (bed>recliner)                 Vision Patient Visual Report: No change from baseline            Cognition Arousal/Alertness: Awake/alert Behavior During Therapy: WFL for tasks assessed/performed Overall Cognitive Status: Impaired/Different from baseline Area of Impairment: Safety/judgement;Problem solving                         Safety/Judgement: Decreased awareness of safety;Decreased awareness of deficits   Problem Solving: Requires verbal cues          Exercises Other Exercises Other Exercises: Re-assesed pt's arm ROM/Strength. Pt remains relatively weak in Bil shoulders (L>R); rest of arms are 4/5. He does have decreased elbow extension in both arms with R worse than L with hard end feel--he cannot tell me if this is the way it has always been or new since admission/recent.           Pertinent Vitals/ Pain       Pain Assessment: Faces Faces Pain Scale: Hurts even more Pain Location: left foot Pain Descriptors / Indicators: Guarding;Aching;Sore Pain  Intervention(s): Limited activity within patient's tolerance;Monitored during session;Repositioned         Frequency  Min 2X/week        Progress Toward Goals  OT Goals(current goals can now be found in the care plan section)  Progress towards OT goals: Progressing toward goals  Acute Rehab OT Goals Patient Stated Goal: to get stronger and go home OT Goal Formulation: With patient Time For Goal Achievement: 04/01/21 Potential to Achieve Goals: Good  Plan Discharge plan needs to be  updated       AM-PAC OT "6 Clicks" Daily Activity     Outcome Measure   Help from another person eating meals?: A Little (S due to swallowing precautions) Help from another person taking care of personal grooming?: A Little Help from another person toileting, which includes using toliet, bedpan, or urinal?: A Lot Help from another person bathing (including washing, rinsing, drying)?: A Lot Help from another person to put on and taking off regular upper body clothing?: A Lot Help from another person to put on and taking off regular lower body clothing?: A Lot 6 Click Score: 14    End of Session Equipment Utilized During Treatment: Gait belt  OT Visit Diagnosis: Unsteadiness on feet (R26.81);Other abnormalities of gait and mobility (R26.89);Muscle weakness (generalized) (M62.81);Pain Pain - Right/Left: Left Pain - part of body: Ankle and joints of foot   Activity Tolerance Patient tolerated treatment well   Patient Left in chair;with call bell/phone within reach;with chair alarm set;with family/visitor present   Nurse Communication Mobility status;Need for lift equipment (chat texted RN and NT)        [id number] OT Time Calculation (min): 41 min  Charges: OT General Charges $OT Visit: 1 Visit OT Treatments $Self Care/Home Management : 38-52 mins  Bruce Hunter, OTR/L Acute [employer] Pager [phone] Office [phone]     Bruce Hunter 03/26/2021, 3:41 PM

## 2021-03-27 ENCOUNTER — Inpatient Hospital Stay (HOSPITAL_COMMUNITY): Payer: Medicaid Other

## 2021-03-27 DIAGNOSIS — N179 Acute kidney failure, unspecified: Secondary | ICD-10-CM | POA: Diagnosis not present

## 2021-03-27 DIAGNOSIS — J9601 Acute respiratory failure with hypoxia: Secondary | ICD-10-CM | POA: Diagnosis not present

## 2021-03-27 DIAGNOSIS — G9341 Metabolic encephalopathy: Secondary | ICD-10-CM | POA: Diagnosis not present

## 2021-03-27 DIAGNOSIS — B20 Human immunodeficiency virus [HIV] disease: Secondary | ICD-10-CM | POA: Diagnosis not present

## 2021-03-27 LAB — RENAL FUNCTION PANEL
Albumin: 2.1 g/dL — ABNORMAL LOW (ref 3.5–5.0)
Anion gap: 10 (ref 5–15)
BUN: 23 mg/dL — ABNORMAL HIGH (ref 6–20)
CO2: 23 mmol/L (ref 22–32)
Calcium: 8.7 mg/dL — ABNORMAL LOW (ref 8.9–10.3)
Chloride: 104 mmol/L (ref 98–111)
Creatinine, Ser: 1.21 mg/dL (ref 0.61–1.24)
GFR, Estimated: >60 mL/min (ref >60)
Glucose, Bld: 93 mg/dL (ref 70–99)
Phosphorus: 3.9 mg/dL (ref 2.5–4.6)
Potassium: 3.2 mmol/L — ABNORMAL LOW (ref 3.5–5.1)
Sodium: 137 mmol/L (ref 135–145)

## 2021-03-27 LAB — GLUCOSE, CAPILLARY
Glucose-Capillary: 127 mg/dL — ABNORMAL HIGH (ref 70–99)
Glucose-Capillary: 166 mg/dL — ABNORMAL HIGH (ref 70–99)
Glucose-Capillary: 132 mg/dL — ABNORMAL HIGH (ref 70–99)
Glucose-Capillary: 123 mg/dL — ABNORMAL HIGH (ref 70–99)

## 2021-03-27 IMAGING — MR MR ANKLE*L* W/O CM
5 of 6 series · 30 of 40 positions shown · non-contrast
Comparison: [DATE] radiographs

CLINICAL DATA: Foot pain. Diabetes with diabetic neuropathy. Recent
acute respiratory failure. Recent acute kidney injury. Gout.

EXAM:
MRI OF THE LEFT ANKLE WITHOUT CONTRAST
TECHNIQUE: Multiplanar, multisequence MR imaging of the ankle was performed. No
intravenous contrast was administered (the patient terminated the
exam prior to IV contrast administration).

[Series 3: PD fat-sat · axial · left · 3.0mm · 0.33mm/px · z∈[-86,+54]mm · 7 of 36 slices shown]
[im 1/36]
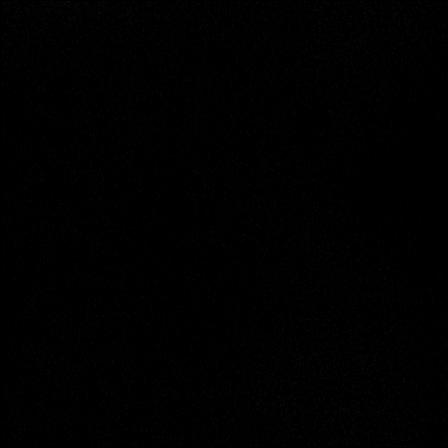
[im 6/36]
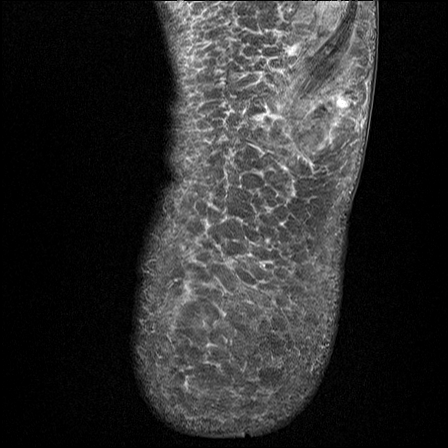
[im 12/36]
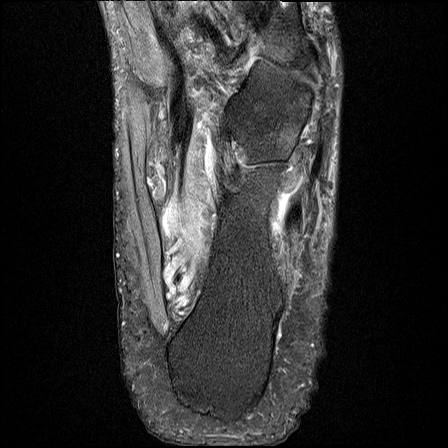
[im 18/36]
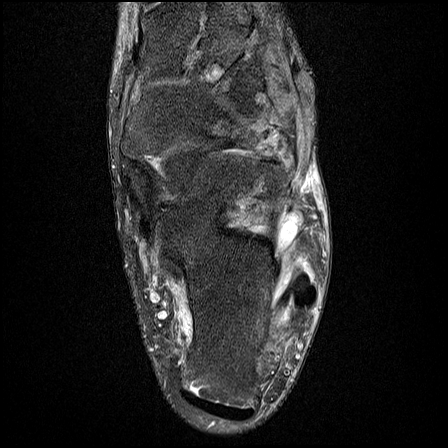
[im 24/36]
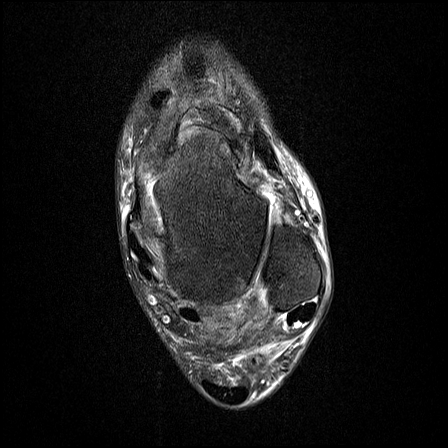
[im 30/36]
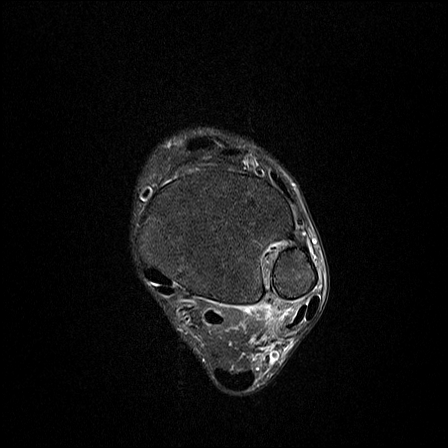
[im 36/36]
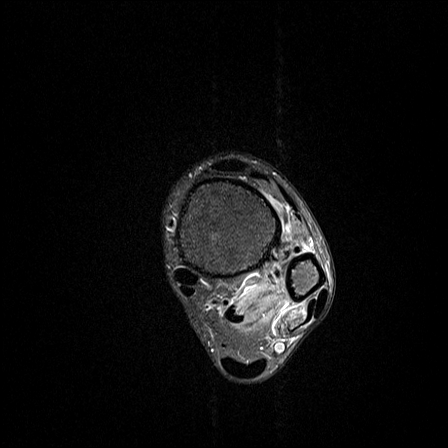

[Series 4: T2 fat-sat · axial · left · 3.0mm · 0.47mm/px · z∈[-74,+66]mm · 8 of 36 slices shown (1 of 2)]
[im 1/36]
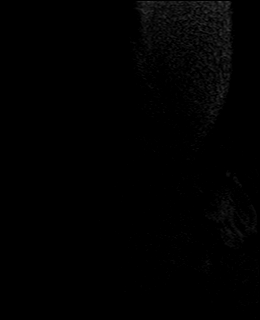
[im 6/36]
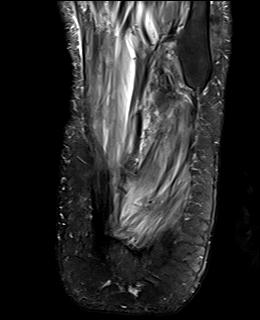
[im 11/36]
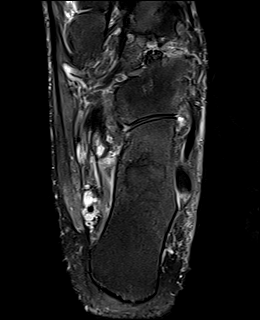
[im 16/36]
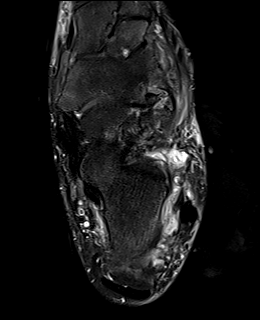
[im 21/36]
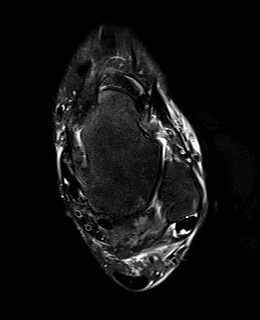
[im 26/36]
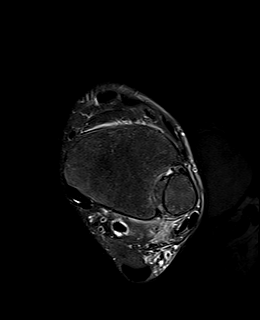
[im 31/36]
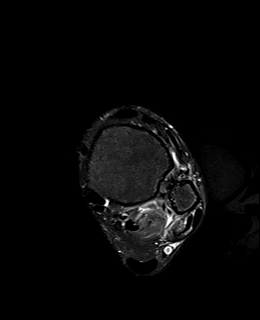
[im 36/36]
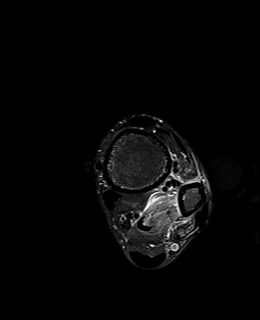

[Series 5: T2 fat-sat · coronal · left · 3.0mm · 0.36mm/px · 7 of 30 slices shown (2 of 2)]
[im 1/30]
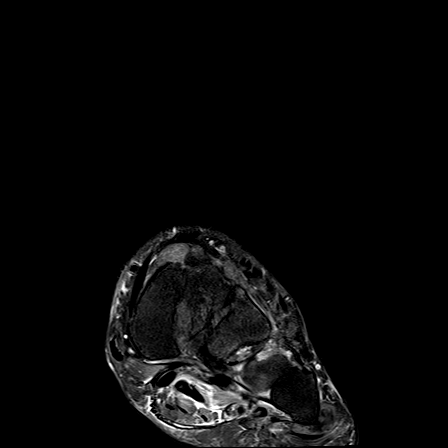
[im 5/30]
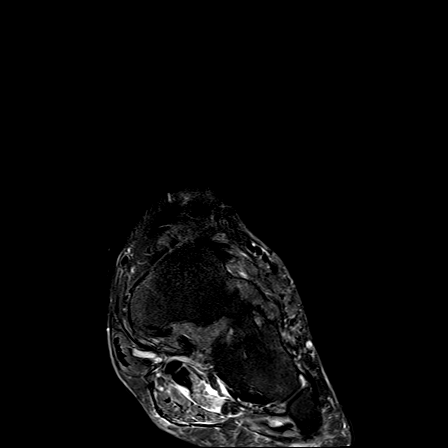
[im 10/30]
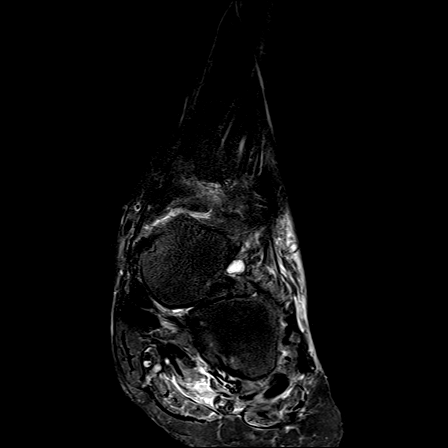
[im 15/30]
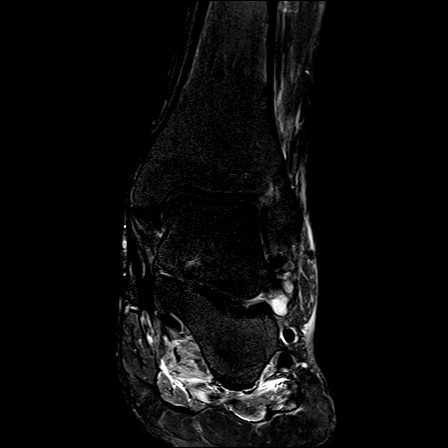
[im 20/30]
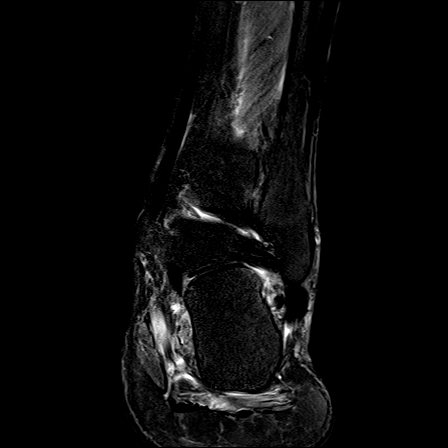
[im 25/30]
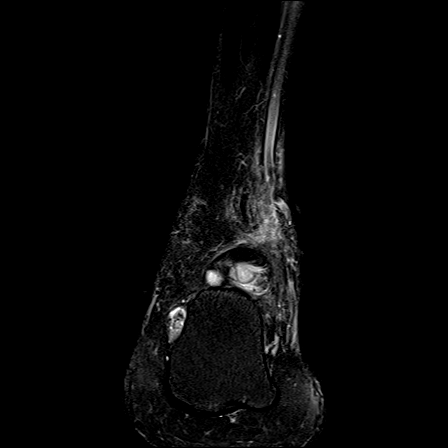
[im 30/30]
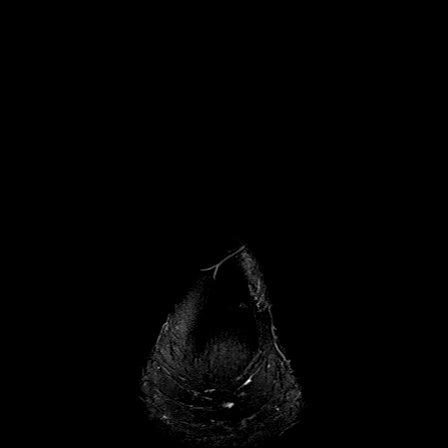

[Series 6: T1 · sagittal · left · 4.0mm · 0.39mm/px · 5 of 21 slices shown]
[im 1/21]
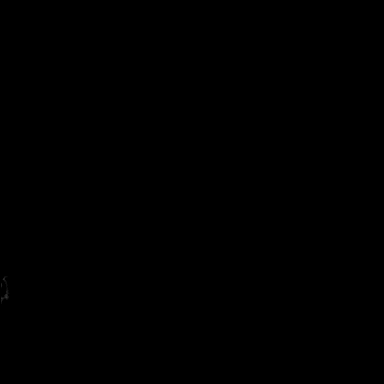
[im 6/21]
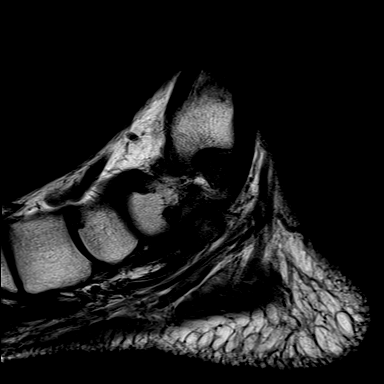
[im 11/21]
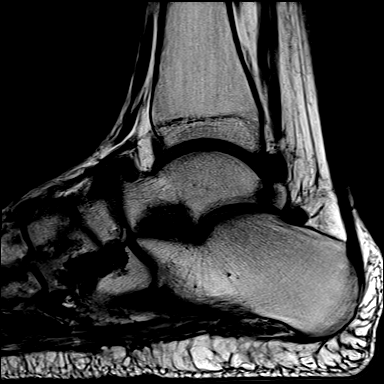
[im 16/21]
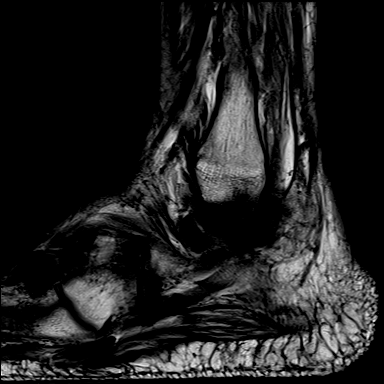
[im 21/21]
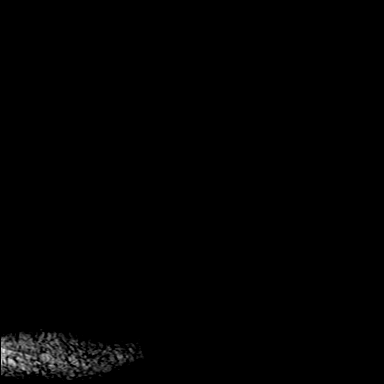

[Series 8: T1 fat-sat · axial · non-contrast · left · 3.0mm · 0.29mm/px · z∈[-74,-34]mm · 3 of 36 slices shown]
[im 1/36]
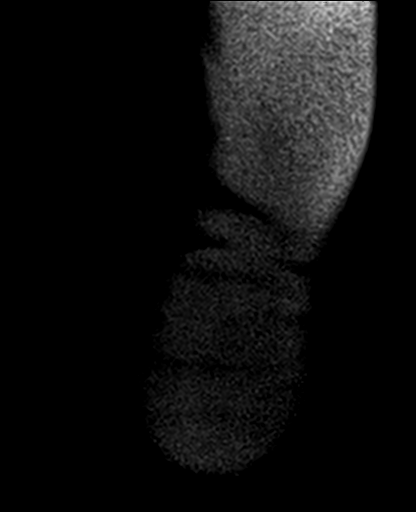
[im 6/36]
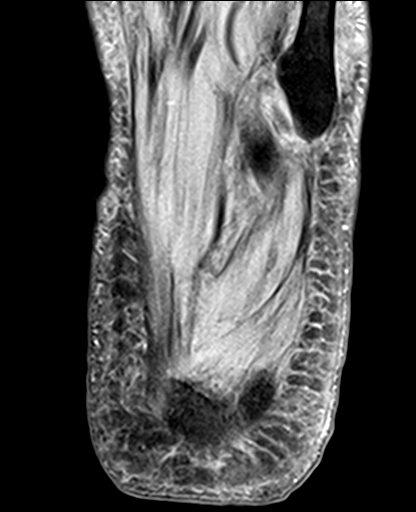
[im 11/36]
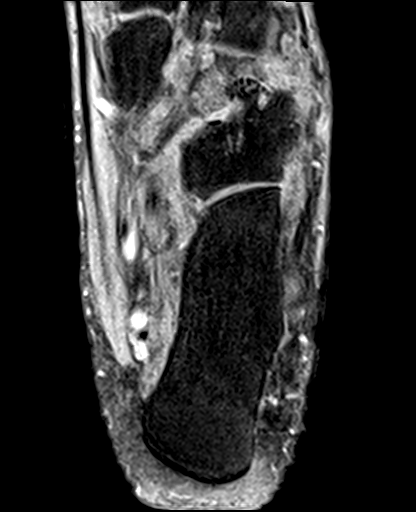

[30 of 40 positions shown; findings below may reference images not displayed]

FINDINGS: TENDONS

Peroneal: Common peroneal and both peroneus longus and brevis
tenosynovitis.

Posteromedial: Unremarkable

Anterior: Unremarkable

Achilles: Unremarkable

Plantar Fascia: Unremarkable

LIGAMENTS

Lateral: Thickened calcaneofibular ligament, otherwise unremarkable.

Medial: Unremarkable

CARTILAGE

Ankle Joint: Borderline joint effusion. No marrow edema along the
margins of the ankle joint.

Subtalar Joints/Sinus Tarsi: Effusion of the posterior subtalar
joint posteriorly and laterally. Os trigonum without internal marrow
edema. Degenerative or erosive subcortical cystic lesion along the
talar side of the middle subtalar facet on image 9 series 6.

Bones: Substantial erosive lesions in the midfoot along the
articulations between the navicular, cuneiform is, and cuboid, with
associated synovitis but with only a mild degree of associated
marrow edema, appearance favoring gout or rheumatoid arthropathy.
Erosions along the base of the second and fourth metatarsal. I not
see highly compelling findings of osteomyelitis.

Other: Abnormal edema in the extensor digitorum brevis muscle and
also in the quadratus plantae, flexor digitorum brevis, and abductor
digiti minimi. There is only minimal edema in the abductor digiti
minimi muscle.
IMPRESSION: 1. Substantial erosive arthropathy in the midfoot and to a lesser
extent along the subtalar joints and Lisfranc joint. Appearance
favors rheumatoid or gout or arthropathy. There is a posterior
subtalar facet effusion. The relative paucity of marrow edema argues
against active infection although there could be some overlap in
appearance between infection and erosive arthropathy. I do not see a
draining sinus tract, but if the patient does have draining tracks
in the foot/ankle region than that might drive further consideration
for infectious process.
2. Peroneus tenosynovitis.
3. Abnormal edema in the extensor digitorum brevis, quadratus
plantae, flexor digitorum brevis, to a lesser extent in the abductor
digiti minimi muscle. Although myositis is a possibility, this could
also be neurogenic edema.

## 2021-03-27 MED ORDER — LIDOCAINE VISCOUS HCL 2 % MT SOLN
15.0000 mL | Freq: Once | OROMUCOSAL | Status: AC
  Administered 2021-03-27: 15 mL via ORAL
  Filled 2021-03-27: qty 15

## 2021-03-27 MED ORDER — ALUM & MAG HYDROXIDE-SIMETH 200-200-20 MG/5ML PO SUSP
30.0000 mL | Freq: Once | ORAL | Status: AC
  Administered 2021-03-27: 30 mL via ORAL
  Filled 2021-03-27: qty 30

## 2021-03-27 MED ORDER — HYDRALAZINE HCL 25 MG PO TABS
25.0000 mg | ORAL_TABLET | Freq: Three times a day (TID) | ORAL | Status: DC
  Administered 2021-03-27 – 2021-03-30 (×10): 25 mg via ORAL
  Filled 2021-03-27 (×10): qty 1

## 2021-03-27 NOTE — Progress Notes (Signed)
Patient c/o heartburn N/V r/t spaghetti meal he ate for dinner. Patient vomited x1. Zofran IV administered per PRN order. On call MD was notified, new orders noted in [facility].

## 2021-03-27 NOTE — Progress Notes (Signed)
PROGRESS NOTE    Bruce Hunter  EZM:[id number] DOB: [birth year] DOA: 03/04/2021 PCP: Elwyn Reach, MD   Brief Narrative: Bruce Hunter is a 45 y.o. male with a history of HIV/AIDS, chronic pain, diabetes mellitus type 2 with diabetic neuropathy, esophageal candidiasis, diverticulosis with recent GI bleeding.  Patient presented secondary to acute respiratory failure with hypoxia.  Patient required ICU admission and intubation.  While admitted, patient developed severe AKI requiring initiation of CRRT.  Infectious disease was consulted as well for diagnosis of PCP.  Work-up for possible meningitis was negative.  Patient was transitioned from ETT to tracheostomy and was eventually successfully taken off CRRT.  Admission has also been complicated by recurrent anemia requiring multiple transfusions.   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 Patient required intubation on 7/1 and was transitioned to tracheostomy performed on 7/13.  Patient is currently stable on trach collar.  Patient using Passy-Muir valve for speaking and eating per speech therapy recommendations.  Infectious disease on board and is treating patient empirically for PCP with clindamycin, primaquine, prednisone with recommendations for total 21 days of treatment.  Acute metabolic encephalopathy In setting of  infection. Resolved.  AKI on CKD stage IIIb Secondary to ATN. Patient presented with a creatinine of 5.88 and started on CRRT. CRRT stopped on 7/15. Creatinine continues to improve and AKI now appears resolved. -Nephrology recommendations: signed off.  Chronic pain Substance abuse disorder Patient was managed with Precedex in the ICU which was weaned off. Clonidine taper started. Oxycodone also started for pain management. Oxycodone is scheduled. Patient has fentanyl IV prn which is now discontinued. -Oxycodone prn  Anxiety Patient is on Klonopin as an outpatient -Continue Klonopin  HIV -Continue Dovato  Prediabetes Hemoglobin A1c of 5.9% on February 12, 2021.  Rechecked in July with a hemoglobin A1c of 5.4%.  Started on Lantus 5 units and SSI while in the ICU.  Currently on prednisone for PCP treatment. -Continue Lantus and sliding scale insulin  Anemia of chronic disease Acute anemia Anemia appears to be chronic.  Previous baseline of around 9.  Patient has required a total of 5 units of PRBC.  Transfusion for recurrent anemia. Iron panel consistent with anemia of chronic disease. Iron levels low.  Patient with a history of fecal occult blood positive stool in June 2022; at that time upper endoscopy and colonoscopy were significant for scattered diverticula throughout entire colon, internal hemorrhoids, erythematous mucosa of the fundus, duodenitis and candidiasis. Hemoglobin appears to be trending down again. Completed IV iron load. -Transfusion for hemoglobin less than 7  Esophageal candidiasis Seen on EGD in June. Recommendation at that time for Diflucan x7 weeks. Course appears to have completed.  Urinary retention Patient had a foley catheter in the ICU which was removed. Patient developed recurrent urinary retention and foley catheter replaced on  7/20. Flomax started. -Continue foley catheter and Flomax -Outpatient urology follow-up  Pressure injury Medial/upper sacrum, POA.  Left/right buttocks, unknown if present on admission.  Ankle pain Gout MRI obtained and significant for substantial erosive arthropathy likely from patient's history of gout.  Hypokalemia -Suplementation   DVT prophylaxis: Heparin subcu Code Status:   Code Status: Full Code Family Communication: None at bedside Disposition Plan: Discharge to CIR once bed is available. Medically stable for discharge to CIR.   Consultants:  PCCM Nephrology Infectious disease  Procedures:    Antimicrobials: Clindamycin IV Dolutegravir Emtricitabine-Tenofovir Lamivudine Linezolid Primaquine Vancomycin IV   Subjective: Patient reports no concerns today. Breathing well. No chest pain.  Objective: Vitals:   03/27/21 0733 03/27/21 1111 03/27/21 1142 03/27/21 1516  BP: (!) 176/98  (!) 198/90   Pulse: 100 (!) 107 95 91  Resp: 18 18 (!) 28 (!) 21  Temp: 98.3 F (36.8 C)     TempSrc: Oral     SpO2: 100% 100% 100% 100%  Weight:      Height:        Intake/Output Summary (Last 24 hours) at 03/27/2021 1628 Last data filed at 03/27/2021 1610 Gross per 24 hour  Intake --  Output 1650 ml  Net -1650 ml    Filed Weights   03/18/21 0500 03/19/21 0500 03/22/21 0600  Weight: 93.8 kg 89.3 kg 79.6 kg    Examination:  General exam: Appears calm and comfortable Respiratory system:  Respiratory effort normal. Cardiovascular system: S1 & S2 heard, RRR. No murmurs, rubs, gallops or clicks. Gastrointestinal system: Abdomen is nondistended, soft and nontender. No organomegaly or masses felt. Normal bowel sounds heard. Central nervous system: Alert and oriented. No focal neurological deficits. Musculoskeletal: No calf tenderness Skin: No cyanosis. No rashes Psychiatry: Judgement and insight appear normal. Mood & affect appropriate.     Data Reviewed: I have personally reviewed following labs and imaging studies  CBC Lab Results  Component Value Date   WBC 10.0 03/25/2021   RBC 2.93 (L)  03/25/2021   HGB 8.5 (L) 03/25/2021   HCT 27.5 (L) 03/25/2021   MCV 93.9 03/25/2021   MCH 29.0 03/25/2021   PLT 272 03/25/2021   MCHC 30.9 03/25/2021   RDW 19.8 (H) 03/25/2021   LYMPHSABS 2.1 03/19/2021   MONOABS 2.0 (H) 03/19/2021   EOSABS 0.2 03/19/2021   BASOSABS 0.0 96/12/5407     Last metabolic panel Lab Results  Component Value Date   NA 137 03/27/2021   K 3.2 (L) 03/27/2021   CL 104 03/27/2021   CO2 23 03/27/2021   BUN 23 (H) 03/27/2021   CREATININE 1.21 03/27/2021   GLUCOSE 93 03/27/2021   GFRNONAA >60 03/27/2021   GFRAA >60 05/29/2011   CALCIUM 8.7 (L) 03/27/2021   PHOS 3.9 03/27/2021   PROT 6.8 03/22/2021   ALBUMIN 2.1 (L) 03/27/2021   BILITOT 0.6 03/22/2021   ALKPHOS 63 03/22/2021   AST 20 03/22/2021   ALT 13 03/22/2021   ANIONGAP 10 03/27/2021    CBG (last 3)  Recent Labs    03/26/21 2104 03/27/21 0713 03/27/21 1141  GLUCAP 89 127* 166*      GFR: Estimated Creatinine Clearance: 83 mL/min (by C-G formula based on SCr of 1.21 mg/dL).  Coagulation Profile: Recent Labs  Lab 03/21/21 0451 03/22/21 1531  INR 1.1 1.1     No results found for this or any previous visit (from the past 240 hour(s)).       Radiology Studies: MR  ANKLE LEFT WO CONTRAST  Result Date: 03/27/2021 CLINICAL DATA:  Foot pain. Diabetes with diabetic neuropathy. Recent acute respiratory failure. Recent acute kidney injury. Gout. EXAM: MRI OF THE LEFT ANKLE WITHOUT CONTRAST TECHNIQUE: Multiplanar, multisequence MR imaging of the ankle was performed. No intravenous contrast was administered (the patient terminated the exam prior to IV contrast administration). COMPARISON:  05/16/2019 radiographs FINDINGS: TENDONS Peroneal: Common peroneal and both peroneus longus and brevis tenosynovitis. Posteromedial: Unremarkable Anterior: Unremarkable Achilles: Unremarkable Plantar Fascia: Unremarkable LIGAMENTS Lateral: Thickened calcaneofibular ligament, otherwise unremarkable. Medial:  Unremarkable CARTILAGE Ankle Joint: Borderline joint effusion. No marrow edema along the margins of the ankle joint. Subtalar Joints/Sinus Tarsi: Effusion of the posterior subtalar joint posteriorly and laterally. Os trigonum without internal marrow edema. Degenerative or erosive subcortical cystic lesion along the talar side of the middle subtalar facet on image 9 series 6. Bones: Substantial erosive lesions in the midfoot along the articulations between the navicular, cuneiform is, and cuboid, with associated synovitis but with only a mild degree of associated marrow edema, appearance favoring gout or rheumatoid arthropathy. Erosions along the base of the second and fourth metatarsal. I not see highly compelling findings of osteomyelitis. Other: Abnormal edema in the extensor digitorum brevis muscle and also in the quadratus plantae, flexor digitorum brevis, and abductor digiti minimi. There is only minimal edema in the abductor digiti minimi muscle. IMPRESSION: 1. Substantial erosive arthropathy in the midfoot and to a lesser extent along the subtalar joints and Lisfranc joint. Appearance favors rheumatoid or gout or arthropathy. There is a posterior subtalar facet effusion. The relative paucity of marrow edema argues against active infection although there could be some overlap in appearance between infection and erosive arthropathy. I do not see a draining sinus tract, but if the patient does have draining tracks in the foot/ankle region than that might drive further consideration for infectious process. 2. Peroneus tenosynovitis. 3. Abnormal edema in the extensor digitorum brevis, quadratus plantae, flexor digitorum brevis, to a lesser extent in the abductor digiti minimi muscle. Although myositis is a possibility, this could also be neurogenic edema. Electronically Signed   By: Van Clines M.D.   On: 03/27/2021 12:00        Scheduled Meds:  amLODipine  10 mg Oral Daily   chlorhexidine gluconate  (MEDLINE KIT)  15 mL Mouth Rinse BID   Chlorhexidine Gluconate Cloth  6 each Topical Daily   clonazePAM  0.5 mg Oral BID   [START ON 03/28/2021] darbepoetin (ARANESP) injection - NON-DIALYSIS  200 mcg Subcutaneous Q Sun-1800   dolutegravir  50 mg Oral Daily   gabapentin  100 mg Oral Q12H   heparin injection (subcutaneous)  5,000 Units Subcutaneous Q8H   hydrALAZINE  25 mg Oral Q8H   insulin aspart  0-15 Units Subcutaneous TID WC   insulin aspart  0-5 Units Subcutaneous QHS   insulin glargine  5 Units Subcutaneous Daily   lamiVUDine  300 mg Oral Daily   lidocaine  1 patch Transdermal Q24H   mouth rinse  15 mL Mouth Rinse q12n4p   multivitamin with minerals  1 tablet Oral Daily   predniSONE  20 mg Oral Q breakfast   primaquine  30 mg Oral Daily   sodium chloride flush  10-40 mL Intracatheter Q12H   Continuous Infusions:  sodium chloride 250 mL (03/21/21 1408)   sodium chloride 250 mL (03/25/21 0345)   clindamycin (CLEOCIN) IV 900 mg (03/27/21 1430)   dextrose 50 mL/hr at 03/26/21 2337  LOS: 23 days     Cordelia Poche, MD [facility] 03/27/2021, 4:28 PM  If 7PM-7AM, please contact night-coverage [email or web address]

## 2021-03-27 NOTE — Progress Notes (Addendum)
PROGRESS NOTE  Late progress note. Patient seen and examined on 03/26/2021  Bruce Hunter  SFK:[id number] DOB: [birth year] DOA: 03/04/2021 PCP: Elwyn Reach, MD   Brief Narrative: Bruce Hunter is a 45 y.o. male with a history of HIV/AIDS, chronic pain, diabetes mellitus type 2 with diabetic neuropathy, esophageal candidiasis, diverticulosis with recent GI bleeding.  Patient presented secondary to acute respiratory failure with hypoxia.  Patient required ICU admission and intubation.  While admitted, patient developed severe AKI requiring initiation of CRRT.  Infectious disease was consulted as well for diagnosis of PCP.  Work-up for possible meningitis was negative.  Patient was transitioned from ETT to tracheostomy and was eventually successfully taken off CRRT.  Admission has also been complicated by recurrent anemia requiring multiple transfusions.   Assessment & Plan:   Principal Problem:   AIDS (acquired immune deficiency syndrome) ([place]) Active Problems:   Acute renal failure (HCC)   Candida esophagitis (HCC)   Multifocal pneumonia   Acute respiratory failure (HCC)   PCP (pneumocystis carinii pneumonia) (HCC)   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 (HCC)   Elevated LFTs   Septic shock (HCC)   Pressure injury of skin   Acute metabolic encephalopathy   Severe sepsis (HCC)   Altered mental status   Status post tracheostomy ([place])   Acute left ankle pain   Acute pain of left shoulder   Acute respiratory failure with hypoxia Secondary to presumed PCP pneumonia. Patient required intubation on 7/1 and was transitioned to tracheostomy performed on 7/13.  Patient is currently stable on trach collar.  Patient using Passy-Muir valve for speaking and eating per speech therapy recommendations.  Infectious disease on board and is treating patient empirically for PCP with clindamycin, primaquine, prednisone with recommendations for total 21 days of  treatment.  Acute metabolic encephalopathy In setting of infection. Resolved.  AKI on CKD stage IIIb Secondary to ATN. Patient presented with a creatinine of 5.88 and started on CRRT. CRRT stopped on 7/15. Creatinine continues to improve. -Nephrology recommendations: No dialysis at this time, follow renal function. Signed off.  Chronic pain Substance abuse disorder Patient was managed with Precedex in the ICU which was weaned off. Clonidine taper started. Oxycodone also started for pain management. Oxycodone is scheduled. Patient has fentanyl IV prn. -Will need to transition to oxycodone prn and discontinue fentanyl -Continue Klonopin  Anxiety Patient is on Klonopin as an outpatient -Continue Klonopin  HIV -Continue Dovato  Prediabetes Hemoglobin A1c of 5.9% on February 12, 2021.  Rechecked in July with a hemoglobin A1c of 5.4%.  Started on Lantus 5 units and SSI while in the ICU.  Currently on prednisone for PCP treatment. -Continue Lantus and sliding scale insulin  Anemia of chronic disease Acute anemia Anemia appears to be chronic.  Previous baseline of around 9.  Patient has required a total of 5 units of PRBC.  Transfusion for recurrent anemia. Iron panel consistent with anemia of chronic disease. Iron levels low.  Patient with a history of fecal occult blood positive stool in June 2022; at that time upper endoscopy and colonoscopy were significant for scattered diverticula throughout entire colon, internal hemorrhoids, erythematous mucosa of the fundus, duodenitis and candidiasis. Hemoglobin appears to be trending down again. Completed IV iron load. -Daily CBC -Transfusion for hemoglobin less than 7  Esophageal candidiasis Seen on EGD in June. Recommendation at that time for Diflucan x7 weeks. Course appears to have completed.  Urinary retention Patient had  a foley catheter in the ICU which was removed. Patient developed recurrent urinary retention and foley catheter replaced on  7/20. Flomax started. -Continue foley catheter and Flomax -Outpatient urology follow-up  Pressure injury Medial/upper sacrum, POA. Left/right buttocks, unknown if present on admission.  Gout/ankle pain -MRI ordered by infectious disease   DVT prophylaxis: Heparin subcu Code Status:   Code Status: Full Code Family Communication: None at bedside Disposition Plan: Discharge to CIR once bed is available. Medically stable for discharge to CIR.   Consultants:  PCCM Nephrology Infectious disease  Procedures:    Antimicrobials: Clindamycin IV Dolutegravir Emtricitabine-Tenofovir Lamivudine Linezolid Primaquine Vancomycin IV   Subjective: No issues noted overnight.  Objective:  Intake/Output Summary (Last 24 hours) at 03/27/2021 1617 Last data filed at 03/27/2021 0620 Gross per 24 hour  Intake --  Output 1650 ml  Net -1650 ml    Filed Weights   03/18/21 0500 03/19/21 0500 03/22/21 0600  Weight: 93.8 kg 89.3 kg 79.6 kg    Examination:  General exam: Appears calm and comfortable Respiratory: No distress. Regular breath rate   Data Reviewed: I have personally reviewed following labs and imaging studies  CBC Lab Results  Component Value Date   WBC 10.0 03/25/2021   RBC 2.93 (L) 03/25/2021   HGB 8.5 (L) 03/25/2021   HCT 27.5 (L) 03/25/2021   MCV 93.9 03/25/2021   MCH 29.0 03/25/2021   PLT 272 03/25/2021   MCHC 30.9 03/25/2021   RDW 19.8 (H) 03/25/2021   LYMPHSABS 2.1 03/19/2021   MONOABS 2.0 (H) 03/19/2021   EOSABS 0.2 03/19/2021   BASOSABS 0.0 56/81/2751     Last metabolic panel Lab Results  Component Value Date   NA 137 03/27/2021   K 3.2 (L) 03/27/2021   CL 104 03/27/2021   CO2 23 03/27/2021   BUN 23 (H) 03/27/2021   CREATININE 1.21 03/27/2021   GLUCOSE 93 03/27/2021   GFRNONAA >60 03/27/2021   GFRAA >60 05/29/2011   CALCIUM 8.7 (L) 03/27/2021   PHOS 3.9 03/27/2021   PROT 6.8 03/22/2021   ALBUMIN 2.1 (L) 03/27/2021   BILITOT 0.6  03/22/2021   ALKPHOS 63 03/22/2021   AST 20 03/22/2021   ALT 13 03/22/2021   ANIONGAP 10 03/27/2021    CBG (last 3)  Recent Labs    03/26/21 2104 03/27/21 0713 03/27/21 1141  GLUCAP 89 127* 166*      GFR: Estimated Creatinine Clearance: 83 mL/min (by C-G formula based on SCr of 1.21 mg/dL).  Coagulation Profile: Recent Labs  Lab 03/21/21 0451 03/22/21 1531  INR 1.1 1.1     No results found for this or any previous visit (from the past 240 hour(s)).       Radiology Studies: MR ANKLE LEFT WO CONTRAST  Result Date: 03/27/2021 CLINICAL DATA:  Foot pain. Diabetes with diabetic neuropathy. Recent acute respiratory failure. Recent acute kidney injury. Gout. EXAM: MRI OF THE LEFT ANKLE WITHOUT CONTRAST TECHNIQUE: Multiplanar, multisequence MR imaging of the ankle was performed. No intravenous contrast was administered (the patient terminated the exam prior to IV contrast administration). COMPARISON:  05/16/2019 radiographs FINDINGS: TENDONS Peroneal: Common peroneal and both peroneus longus and brevis tenosynovitis. Posteromedial: Unremarkable Anterior: Unremarkable Achilles: Unremarkable Plantar Fascia: Unremarkable LIGAMENTS Lateral: Thickened calcaneofibular ligament, otherwise unremarkable. Medial: Unremarkable CARTILAGE Ankle Joint: Borderline joint effusion. No marrow edema along the margins of the ankle joint. Subtalar Joints/Sinus Tarsi: Effusion of the posterior subtalar joint posteriorly and laterally. Os trigonum without internal marrow edema. Degenerative or erosive  subcortical cystic lesion along the talar side of the middle subtalar facet on image 9 series 6. Bones: Substantial erosive lesions in the midfoot along the articulations between the navicular, cuneiform is, and cuboid, with associated synovitis but with only a mild degree of associated marrow edema, appearance favoring gout or rheumatoid arthropathy. Erosions along the base of the second and fourth metatarsal. I  not see highly compelling findings of osteomyelitis. Other: Abnormal edema in the extensor digitorum brevis muscle and also in the quadratus plantae, flexor digitorum brevis, and abductor digiti minimi. There is only minimal edema in the abductor digiti minimi muscle. IMPRESSION: 1. Substantial erosive arthropathy in the midfoot and to a lesser extent along the subtalar joints and Lisfranc joint. Appearance favors rheumatoid or gout or arthropathy. There is a posterior subtalar facet effusion. The relative paucity of marrow edema argues against active infection although there could be some overlap in appearance between infection and erosive arthropathy. I do not see a draining sinus tract, but if the patient does have draining tracks in the foot/ankle region than that might drive further consideration for infectious process. 2. Peroneus tenosynovitis. 3. Abnormal edema in the extensor digitorum brevis, quadratus plantae, flexor digitorum brevis, to a lesser extent in the abductor digiti minimi muscle. Although myositis is a possibility, this could also be neurogenic edema. Electronically Signed   By: Van Clines M.D.   On: 03/27/2021 12:00        Scheduled Meds:  amLODipine  10 mg Oral Daily   chlorhexidine gluconate (MEDLINE KIT)  15 mL Mouth Rinse BID   Chlorhexidine Gluconate Cloth  6 each Topical Daily   clonazePAM  0.5 mg Oral BID   [START ON 03/28/2021] darbepoetin (ARANESP) injection - NON-DIALYSIS  200 mcg Subcutaneous Q Sun-1800   dolutegravir  50 mg Oral Daily   gabapentin  100 mg Oral Q12H   heparin injection (subcutaneous)  5,000 Units Subcutaneous Q8H   hydrALAZINE  25 mg Oral Q8H   insulin aspart  0-15 Units Subcutaneous TID WC   insulin aspart  0-5 Units Subcutaneous QHS   insulin glargine  5 Units Subcutaneous Daily   lamiVUDine  300 mg Oral Daily   lidocaine  1 patch Transdermal Q24H   mouth rinse  15 mL Mouth Rinse q12n4p   multivitamin with minerals  1 tablet Oral Daily    predniSONE  20 mg Oral Q breakfast   primaquine  30 mg Oral Daily   sodium chloride flush  10-40 mL Intracatheter Q12H   Continuous Infusions:  sodium chloride 250 mL (03/21/21 1408)   sodium chloride 250 mL (03/25/21 0345)   clindamycin (CLEOCIN) IV 900 mg (03/27/21 1430)   dextrose 50 mL/hr at 03/26/21 2337     LOS: 23 days     Cordelia Poche, MD [facility] 03/27/2021, 4:17 PM  If 7PM-7AM, please contact night-coverage [email or web address]

## 2021-03-27 NOTE — Progress Notes (Signed)
Trach sutures removed per MD. New trach ties secured.

## 2021-03-27 NOTE — Plan of Care (Signed)
  Problem: Education: Goal: Knowledge of General Education information will improve Description: Including pain rating scale, medication(s)/side effects and non-pharmacologic comfort measures Outcome: Progressing   Problem: Activity: Goal: Risk for activity intolerance will decrease Outcome: Progressing   Problem: Nutrition: Goal: Adequate nutrition will be maintained Outcome: Progressing   Problem: Coping: Goal: Level of anxiety will decrease Outcome: Progressing   

## 2021-03-28 ENCOUNTER — Inpatient Hospital Stay (HOSPITAL_COMMUNITY): Payer: Medicaid Other

## 2021-03-28 DIAGNOSIS — B20 Human immunodeficiency virus [HIV] disease: Secondary | ICD-10-CM | POA: Diagnosis not present

## 2021-03-28 DIAGNOSIS — J9601 Acute respiratory failure with hypoxia: Secondary | ICD-10-CM | POA: Diagnosis not present

## 2021-03-28 DIAGNOSIS — G9341 Metabolic encephalopathy: Secondary | ICD-10-CM | POA: Diagnosis not present

## 2021-03-28 DIAGNOSIS — N179 Acute kidney failure, unspecified: Secondary | ICD-10-CM | POA: Diagnosis not present

## 2021-03-28 LAB — RENAL FUNCTION PANEL
Albumin: 2.2 g/dL — ABNORMAL LOW (ref 3.5–5.0)
Anion gap: 9 (ref 5–15)
BUN: 17 mg/dL (ref 6–20)
CO2: 24 mmol/L (ref 22–32)
Calcium: 8.6 mg/dL — ABNORMAL LOW (ref 8.9–10.3)
Chloride: 104 mmol/L (ref 98–111)
Creatinine, Ser: 1.2 mg/dL (ref 0.61–1.24)
GFR, Estimated: >60 mL/min (ref >60)
Glucose, Bld: 113 mg/dL — ABNORMAL HIGH (ref 70–99)
Phosphorus: 3.4 mg/dL (ref 2.5–4.6)
Potassium: 3.4 mmol/L — ABNORMAL LOW (ref 3.5–5.1)
Sodium: 137 mmol/L (ref 135–145)

## 2021-03-28 LAB — GLUCOSE, CAPILLARY
Glucose-Capillary: 132 mg/dL — ABNORMAL HIGH (ref 70–99)
Glucose-Capillary: 88 mg/dL (ref 70–99)
Glucose-Capillary: 153 mg/dL — ABNORMAL HIGH (ref 70–99)
Glucose-Capillary: 161 mg/dL — ABNORMAL HIGH (ref 70–99)

## 2021-03-28 LAB — MAGNESIUM: Magnesium: 1 mg/dL — ABNORMAL LOW (ref 1.7–2.4)

## 2021-03-28 IMAGING — DX DG SHOULDER 1V*L*
1 series · 3 of 3 positions shown · non-contrast
Comparison: None.

CLINICAL DATA: Left shoulder pain.  No reported injury.

EXAM:
LEFT SHOULDER

[Series 1: shoulder · 0.14mm/px · 3 of 3 slices shown]
[im 1/3]
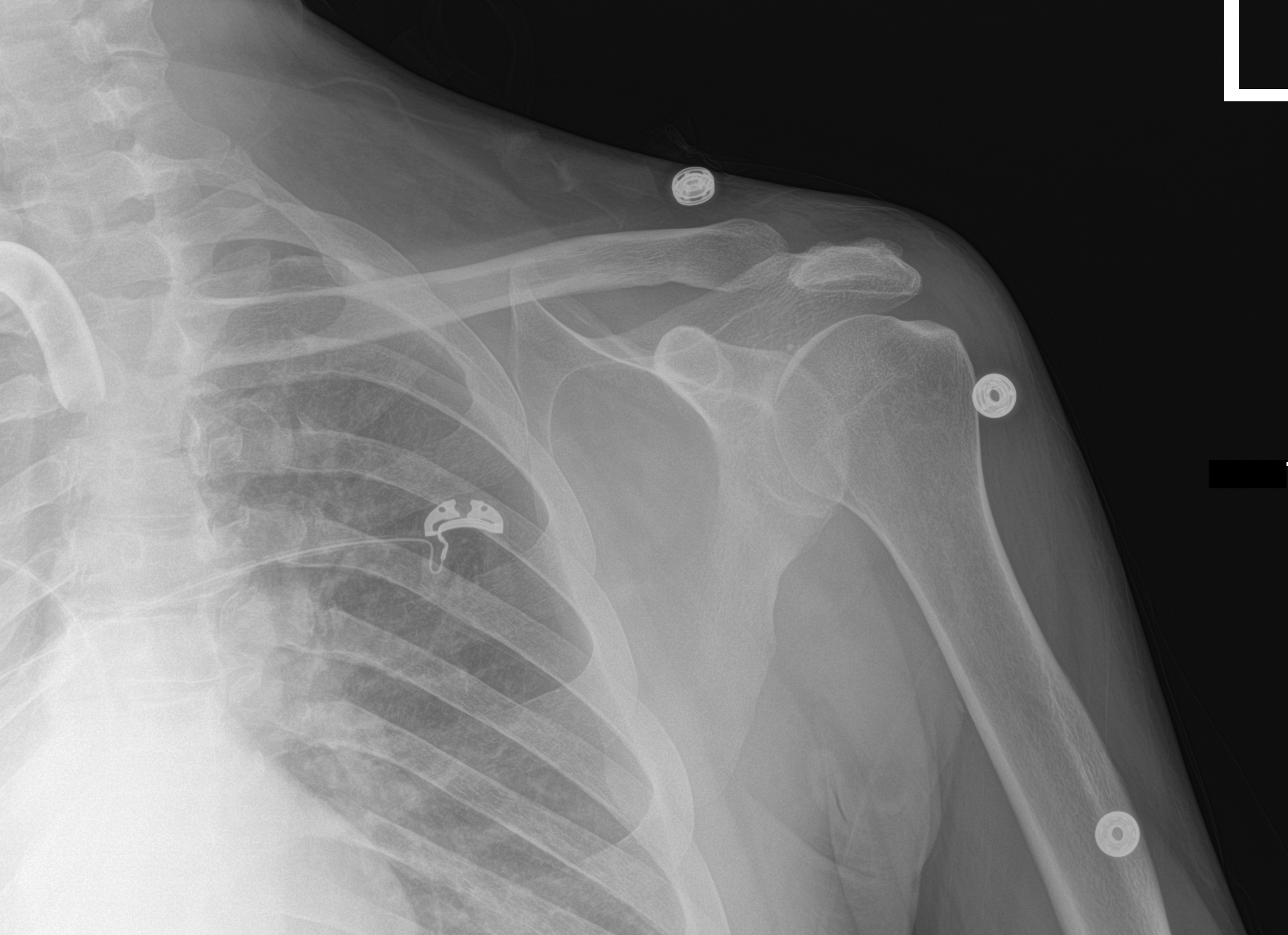
[im 2/3]
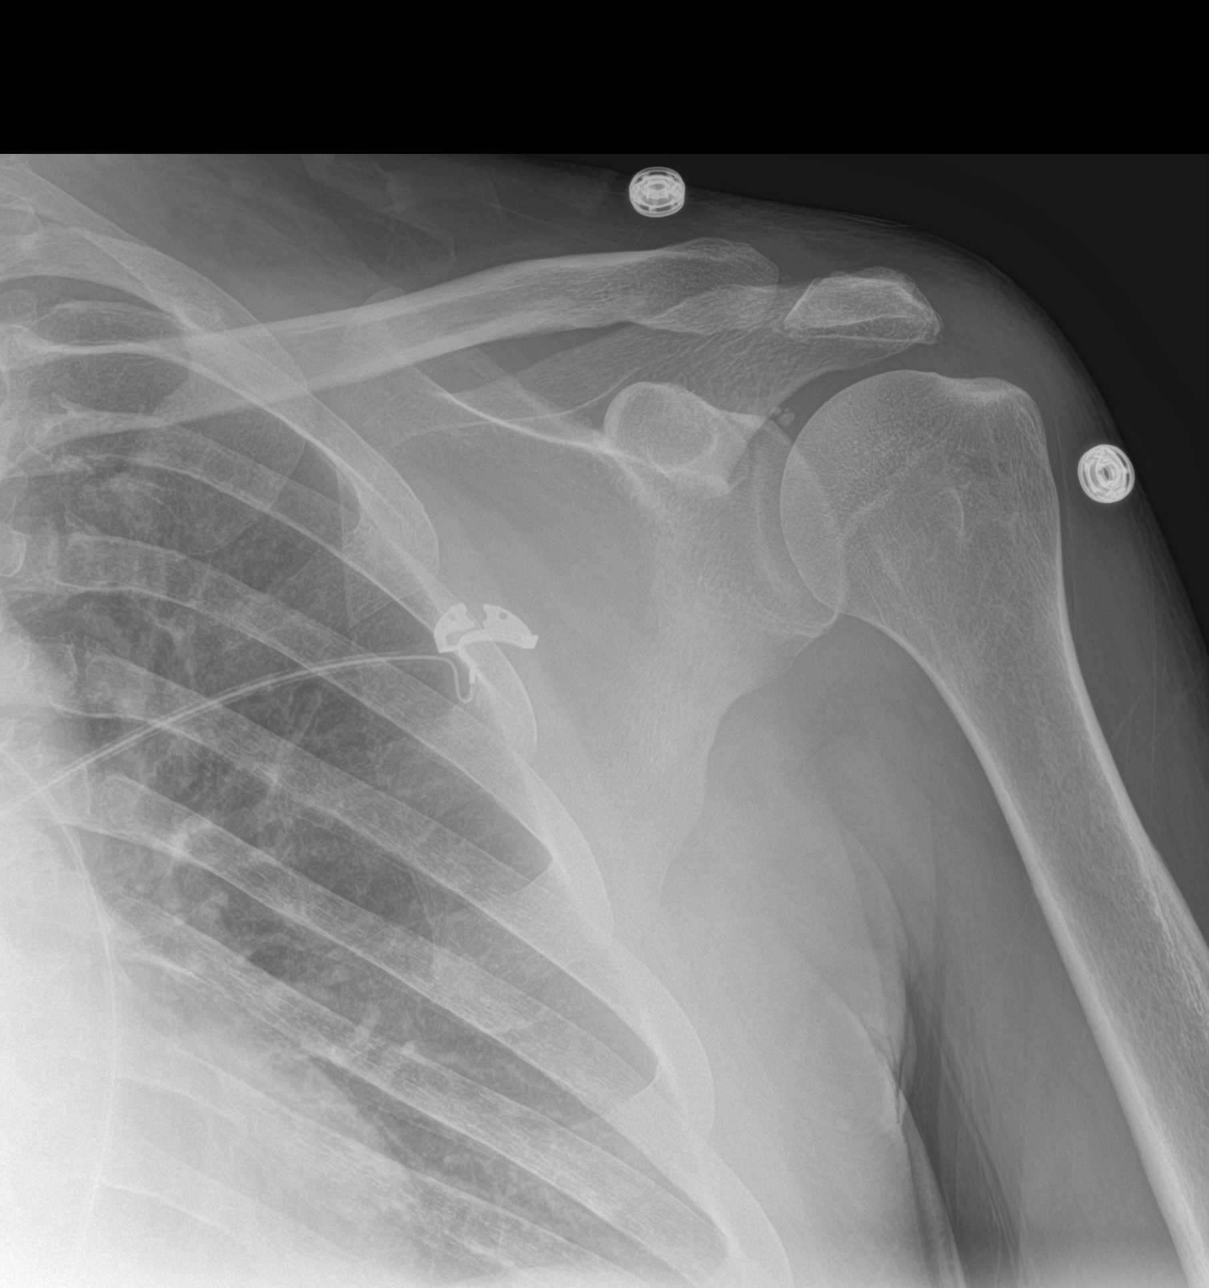
[im 3/3]
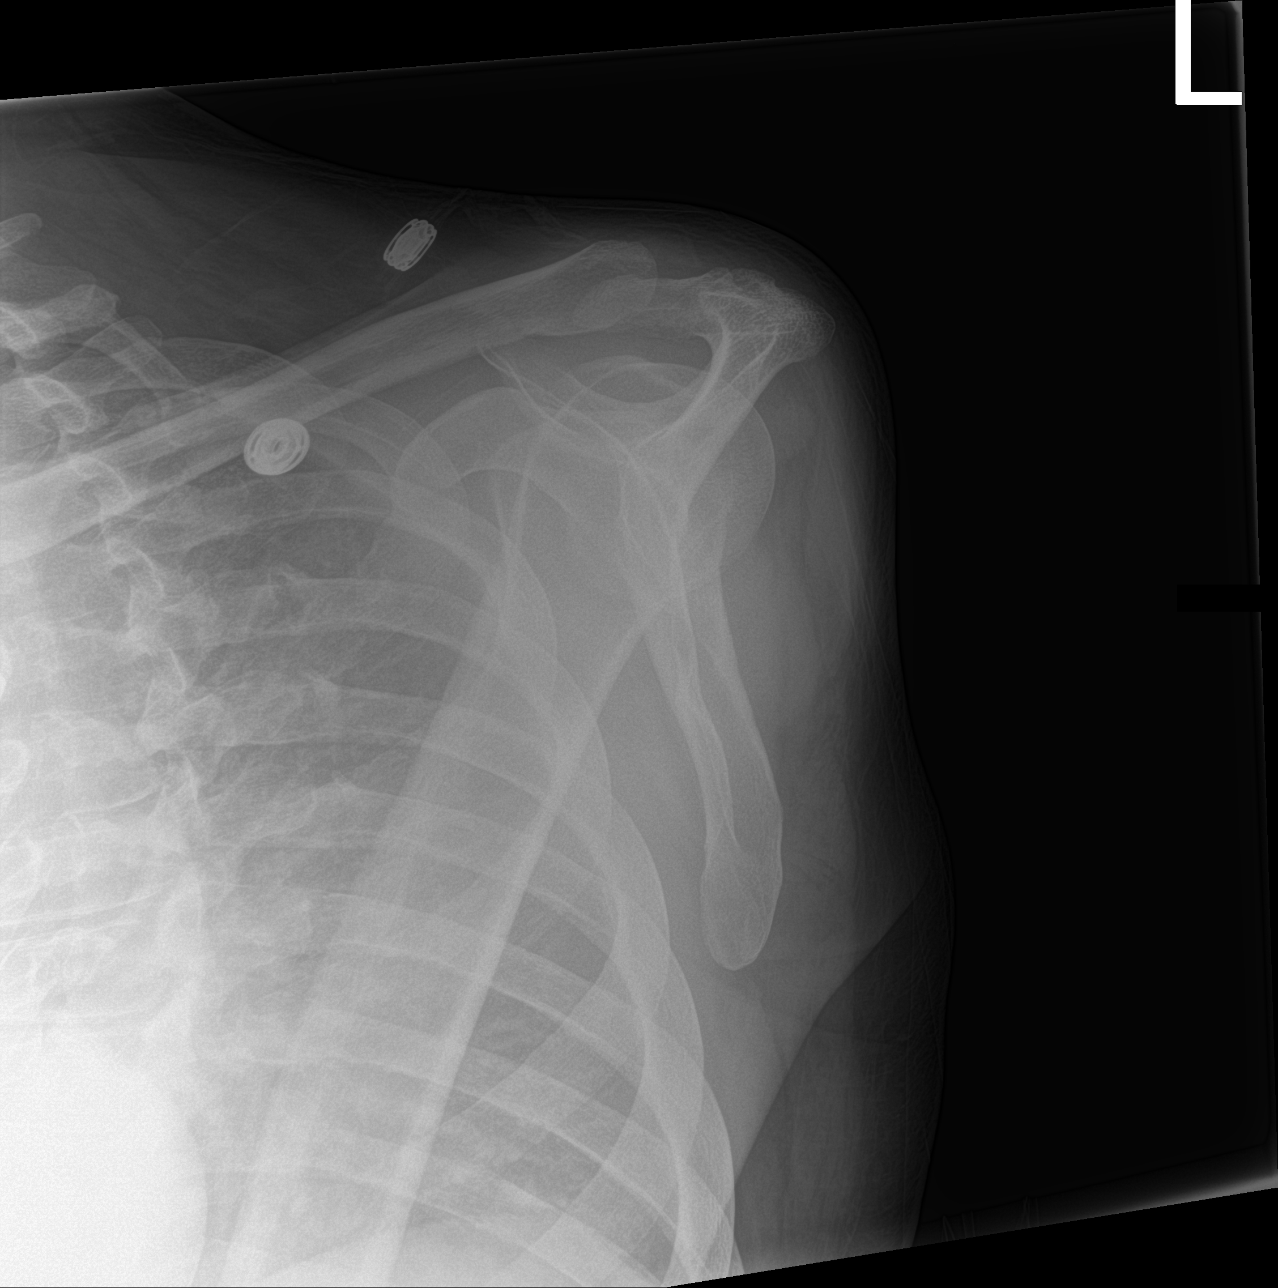

[3 of 3 positions shown; findings below may reference images not displayed]

FINDINGS: There is no evidence of fracture or dislocation. There is no
evidence of arthropathy or other focal bone abnormality. Soft
tissues are unremarkable.
IMPRESSION: Negative.

## 2021-03-28 MED ORDER — MAGNESIUM SULFATE 2 GM/50ML IV SOLN
2.0000 g | Freq: Once | INTRAVENOUS | Status: AC
  Administered 2021-03-28: 2 g via INTRAVENOUS
  Filled 2021-03-28: qty 50

## 2021-03-28 MED ORDER — POTASSIUM CHLORIDE 20 MEQ PO PACK
40.0000 meq | PACK | Freq: Once | ORAL | Status: AC
  Administered 2021-03-28: 40 meq via ORAL
  Filled 2021-03-28: qty 2

## 2021-03-28 MED ORDER — POTASSIUM CHLORIDE CRYS ER 20 MEQ PO TBCR: 40.0000 meq | EXTENDED_RELEASE_TABLET | ORAL | Status: DC

## 2021-03-28 MED ORDER — OXYCODONE HCL 5 MG PO TABS
5.0000 mg | ORAL_TABLET | Freq: Four times a day (QID) | ORAL | Status: DC | PRN
  Administered 2021-03-28 – 2021-03-30 (×9): 10 mg via ORAL
  Filled 2021-03-28 (×8): qty 2
  Filled 2021-03-28: qty 1

## 2021-03-28 NOTE — Progress Notes (Signed)
PROGRESS NOTE    Bruce Hunter  OEH:[id number] DOB: 07/07/1976 DOA: 03/04/2021 PCP: Elwyn Reach, MD   Brief Narrative: Bruce Hunter is a 45 y.o. male with a history of HIV/AIDS, chronic pain, diabetes mellitus type 2 with diabetic neuropathy, esophageal candidiasis, diverticulosis with recent GI bleeding.  Patient presented secondary to acute respiratory failure with hypoxia.  Patient required ICU admission and intubation.  While admitted, patient developed severe AKI requiring initiation of CRRT.  Infectious disease was consulted as well for diagnosis of PCP.  Work-up for possible meningitis was negative.  Patient was transitioned from ETT to tracheostomy and was eventually successfully taken off CRRT.  Admission has also been complicated by recurrent anemia requiring multiple transfusions.   Assessment & Plan:   Principal Problem:   AIDS (acquired immune deficiency syndrome) ([name]) Active Problems:   Acute renal failure (HCC)   Candida esophagitis (HCC)   Multifocal pneumonia   Acute respiratory failure (HCC)   PCP (pneumocystis carinii pneumonia) (HCC)   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 (HCC)   Elevated LFTs   Septic shock (HCC)   Pressure injury of skin   Acute metabolic encephalopathy   Severe sepsis (HCC)   Altered mental status   Status post tracheostomy ([place])   Acute left ankle pain   Acute pain of left shoulder   Acute respiratory failure with hypoxia Secondary to presumed PCP pneumonia. Patient required intubation on 7/1 and was transitioned to tracheostomy performed on 7/13.  Patient is currently stable on trach collar.  Patient using Passy-Muir valve for speaking and eating per speech therapy recommendations.  Infectious disease on board and is treating patient empirically for PCP with clindamycin, primaquine, prednisone with recommendations for total 21 days of treatment.  Acute metabolic encephalopathy In setting of  infection. Resolved.  AKI on CKD stage IIIb Secondary to ATN. Patient presented with a creatinine of 5.88 and started on CRRT. CRRT stopped on 7/15. Creatinine continues to improve and AKI now appears resolved. -Nephrology recommendations: signed off.  Chronic pain Substance abuse disorder Patient was managed with Precedex in the ICU which was weaned off. Clonidine taper started. Oxycodone also started for pain management. Oxycodone is scheduled. Patient has fentanyl IV prn which is now discontinued. -Oxycodone prn  Anxiety Patient is on Klonopin as an outpatient -Continue Klonopin  HIV -Continue Dovato  Prediabetes Hemoglobin A1c of 5.9% on February 12, 2021.  Rechecked in July with a hemoglobin A1c of 5.4%.  Started on Lantus 5 units and SSI while in the ICU.  Currently on prednisone for PCP treatment. -Continue Lantus and sliding scale insulin  Anemia of chronic disease Acute anemia Anemia appears to be chronic.  Previous baseline of around 9.  Patient has required a total of 5 units of PRBC.  Transfusion for recurrent anemia. Iron panel consistent with anemia of chronic disease. Iron levels low.  Patient with a history of fecal occult blood positive stool in June 2022; at that time upper endoscopy and colonoscopy were significant for scattered diverticula throughout entire colon, internal hemorrhoids, erythematous mucosa of the fundus, duodenitis and candidiasis. Hemoglobin appears to be trending down again. Completed IV iron load. -Transfusion for hemoglobin less than 7  Esophageal candidiasis Seen on EGD in June. Recommendation at that time for Diflucan x7 weeks. Course appears to have completed.  Urinary retention Patient had a foley catheter in the ICU which was removed. Patient developed recurrent urinary retention and foley catheter replaced on  7/20. Flomax started. -Continue foley catheter and Flomax -Outpatient urology follow-up  Pressure injury Medial/upper sacrum, POA.  Left/right buttocks, unknown if present on admission.  Ankle pain Gout MRI obtained and significant for substantial erosive arthropathy likely from patient's history of gout. Patient was previously on colchicine. Significant ankle pain. Hesitant to start NSAIDs secondary to recent kidney injury requiring renal replacement therapy. -Increase oxycodone -On prednisone for pneumonia  Shoulder pain No passive ROM pain. ROM was full. Unlikely a septic joint -Shoulder x-ray  Hypokalemia -Supplementation  Hypomagnesemia -Supplementation with IV magnesium   DVT prophylaxis: Heparin subcu Code Status:   Code Status: Full Code Family Communication: None at bedside Disposition Plan: Discharge to CIR once bed is available. Medically stable for discharge to CIR.   Consultants:  PCCM Nephrology Infectious disease PM&R  Procedures:    Antimicrobials: Clindamycin IV Dolutegravir Emtricitabine-Tenofovir Lamivudine Linezolid Primaquine Vancomycin IV   Subjective: Ankle pain and left shoulder pain. Afebrile overnight. No other concerns.  Objective: Vitals:   03/28/21 0410 03/28/21 0423 03/28/21 0740 03/28/21 0830  BP:  (!) 154/92 (!) 129/96   Pulse: 97 92 98 74  Resp: 18 20 (!) 21 (!) 21  Temp:  99.4 F (37.4 C) 98.8 F (37.1 C)   TempSrc:  Oral Oral   SpO2: 95% 99% 100% 100%  Weight:      Height:        Intake/Output Summary (Last 24 hours) at 03/28/2021 1052 Last data filed at 03/28/2021 0530 Gross per 24 hour  Intake 200 ml  Output 750 ml  Net -550 ml    Filed Weights   03/18/21 0500 03/19/21 0500 03/22/21 0600  Weight: 93.8 kg 89.3 kg 79.6 kg    Examination:  General exam: Appears calm and comfortable Respiratory system: Mild wheezing. Respiratory effort normal. Trach Cardiovascular system: S1 & S2 heard, RRR. No murmurs, rubs, gallops or clicks. Gastrointestinal system: Abdomen is nondistended, soft and nontender. No organomegaly or masses felt. Normal  bowel sounds heard. Central nervous system: Alert and oriented. No focal neurological deficits. Musculoskeletal: No calf tenderness. Left shoulder with point tenderness over acromion. No AC joint tenderness. No pain with passive ROM of left upper extremity Skin: No cyanosis. No rashes Psychiatry: Judgement and insight appear normal. Mood & affect appropriate.     Data Reviewed: I have personally reviewed following labs and imaging studies  CBC Lab Results  Component Value Date   WBC 10.0 03/25/2021   RBC 2.93 (L) 03/25/2021   HGB 8.5 (L) 03/25/2021   HCT 27.5 (L) 03/25/2021   MCV 93.9 03/25/2021   MCH 29.0 03/25/2021   PLT 272 03/25/2021   MCHC 30.9 03/25/2021   RDW 19.8 (H) 03/25/2021   LYMPHSABS 2.1 03/19/2021   MONOABS 2.0 (H) 03/19/2021   EOSABS 0.2 03/19/2021   BASOSABS 0.0 53/66/4403     Last metabolic panel Lab Results  Component Value Date   NA 137 03/28/2021   K 3.4 (L) 03/28/2021   CL 104 03/28/2021   CO2 24 03/28/2021   BUN 17 03/28/2021   CREATININE 1.20 03/28/2021   GLUCOSE 113 (H) 03/28/2021   GFRNONAA >60 03/28/2021   GFRAA >60 05/29/2011   CALCIUM 8.6 (L) 03/28/2021   PHOS 3.4 03/28/2021   PROT 6.8 03/22/2021   ALBUMIN 2.2 (L) 03/28/2021   BILITOT 0.6 03/22/2021   ALKPHOS 63 03/22/2021   AST 20 03/22/2021   ALT 13 03/22/2021   ANIONGAP 9 03/28/2021    CBG (last 3)  Recent Labs  03/27/21 1640 03/27/21 2244 03/28/21 0738  GLUCAP 132* 123* 132*      GFR: Estimated Creatinine Clearance: 83.7 mL/min (by C-G formula based on SCr of 1.2 mg/dL).  Coagulation Profile: Recent Labs  Lab 03/22/21 1531  INR 1.1     No results found for this or any previous visit (from the past 240 hour(s)).       Radiology Studies: MR ANKLE LEFT WO CONTRAST  Result Date: 03/27/2021 CLINICAL DATA:  Foot pain. Diabetes with diabetic neuropathy. Recent acute respiratory failure. Recent acute kidney injury. Gout. EXAM: MRI OF THE LEFT ANKLE WITHOUT  CONTRAST TECHNIQUE: Multiplanar, multisequence MR imaging of the ankle was performed. No intravenous contrast was administered (the patient terminated the exam prior to IV contrast administration). COMPARISON:  05/16/2019 radiographs FINDINGS: TENDONS Peroneal: Common peroneal and both peroneus longus and brevis tenosynovitis. Posteromedial: Unremarkable Anterior: Unremarkable Achilles: Unremarkable Plantar Fascia: Unremarkable LIGAMENTS Lateral: Thickened calcaneofibular ligament, otherwise unremarkable. Medial: Unremarkable CARTILAGE Ankle Joint: Borderline joint effusion. No marrow edema along the margins of the ankle joint. Subtalar Joints/Sinus Tarsi: Effusion of the posterior subtalar joint posteriorly and laterally. Os trigonum without internal marrow edema. Degenerative or erosive subcortical cystic lesion along the talar side of the middle subtalar facet on image 9 series 6. Bones: Substantial erosive lesions in the midfoot along the articulations between the navicular, cuneiform is, and cuboid, with associated synovitis but with only a mild degree of associated marrow edema, appearance favoring gout or rheumatoid arthropathy. Erosions along the base of the second and fourth metatarsal. I not see highly compelling findings of osteomyelitis. Other: Abnormal edema in the extensor digitorum brevis muscle and also in the quadratus plantae, flexor digitorum brevis, and abductor digiti minimi. There is only minimal edema in the abductor digiti minimi muscle. IMPRESSION: 1. Substantial erosive arthropathy in the midfoot and to a lesser extent along the subtalar joints and Lisfranc joint. Appearance favors rheumatoid or gout or arthropathy. There is a posterior subtalar facet effusion. The relative paucity of marrow edema argues against active infection although there could be some overlap in appearance between infection and erosive arthropathy. I do not see a draining sinus tract, but if the patient does have  draining tracks in the foot/ankle region than that might drive further consideration for infectious process. 2. Peroneus tenosynovitis. 3. Abnormal edema in the extensor digitorum brevis, quadratus plantae, flexor digitorum brevis, to a lesser extent in the abductor digiti minimi muscle. Although myositis is a possibility, this could also be neurogenic edema. Electronically Signed   By: Van Clines M.D.   On: 03/27/2021 12:00        Scheduled Meds:  amLODipine  10 mg Oral Daily   chlorhexidine gluconate (MEDLINE KIT)  15 mL Mouth Rinse BID   Chlorhexidine Gluconate Cloth  6 each Topical Daily   clonazePAM  0.5 mg Oral BID   darbepoetin (ARANESP) injection - NON-DIALYSIS  200 mcg Subcutaneous Q Sun-1800   dolutegravir  50 mg Oral Daily   gabapentin  100 mg Oral Q12H   heparin injection (subcutaneous)  5,000 Units Subcutaneous Q8H   hydrALAZINE  25 mg Oral Q8H   insulin aspart  0-15 Units Subcutaneous TID WC   insulin aspart  0-5 Units Subcutaneous QHS   insulin glargine  5 Units Subcutaneous Daily   lamiVUDine  300 mg Oral Daily   lidocaine  1 patch Transdermal Q24H   mouth rinse  15 mL Mouth Rinse q12n4p   multivitamin with minerals  1 tablet Oral Daily  predniSONE  20 mg Oral Q breakfast   primaquine  30 mg Oral Daily   sodium chloride flush  10-40 mL Intracatheter Q12H   Continuous Infusions:  sodium chloride 250 mL (03/21/21 1408)   sodium chloride 250 mL (03/25/21 0345)   clindamycin (CLEOCIN) IV 900 mg (03/28/21 0530)   dextrose 50 mL/hr at 03/26/21 2337     LOS: 24 days     Cordelia Poche, MD [facility] 03/28/2021, 10:52 AM  If 7PM-7AM, please contact night-coverage [email or web address]

## 2021-03-28 NOTE — Plan of Care (Signed)

## 2021-03-28 NOTE — Plan of Care (Signed)
  Problem: Education: Goal: Knowledge of General Education information will improve Description: Including pain rating scale, medication(s)/side effects and non-pharmacologic comfort measures Outcome: Progressing   Problem: Health Behavior/Discharge Planning: Goal: Ability to manage health-related needs will improve Outcome: Progressing   Problem: Clinical Measurements: Goal: Ability to maintain clinical measurements within normal limits will improve Outcome: Progressing Goal: Will remain free from infection Outcome: Progressing Goal: Diagnostic test results will improve Outcome: Progressing Goal: Respiratory complications will improve Outcome: Progressing Goal: Cardiovascular complication will be avoided Outcome: Progressing   Problem: Activity: Goal: Risk for activity intolerance will decrease Outcome: Progressing   Problem: Nutrition: Goal: Adequate nutrition will be maintained Outcome: Progressing   Problem: Coping: Goal: Level of anxiety will decrease Outcome: Progressing   Problem: Elimination: Goal: Will not experience complications related to bowel motility Outcome: Progressing Goal: Will not experience complications related to urinary retention Outcome: Progressing   Problem: Pain Managment: Goal: General experience of comfort will improve Outcome: Progressing   Problem: Safety: Goal: Ability to remain free from injury will improve Outcome: Progressing   Problem: Skin Integrity: Goal: Risk for impaired skin integrity will decrease Outcome: Progressing   Problem: Safety: Goal: Non-violent Restraint(s) Outcome: Progressing   Problem: Fluid Volume: Goal: Hemodynamic stability will improve Outcome: Progressing   Problem: Clinical Measurements: Goal: Diagnostic test results will improve Outcome: Progressing Goal: Signs and symptoms of infection will decrease Outcome: Progressing   Problem: Respiratory: Goal: Ability to maintain adequate  ventilation will improve Outcome: Progressing

## 2021-03-29 DIAGNOSIS — B20 Human immunodeficiency virus [HIV] disease: Secondary | ICD-10-CM | POA: Diagnosis not present

## 2021-03-29 DIAGNOSIS — G9341 Metabolic encephalopathy: Secondary | ICD-10-CM | POA: Diagnosis not present

## 2021-03-29 DIAGNOSIS — J189 Pneumonia, unspecified organism: Secondary | ICD-10-CM | POA: Diagnosis not present

## 2021-03-29 DIAGNOSIS — J9601 Acute respiratory failure with hypoxia: Secondary | ICD-10-CM | POA: Diagnosis not present

## 2021-03-29 DIAGNOSIS — M25572 Pain in left ankle and joints of left foot: Secondary | ICD-10-CM | POA: Diagnosis not present

## 2021-03-29 LAB — CBC
HCT: 31.2 % — ABNORMAL LOW (ref 39.0–52.0)
Hemoglobin: 9.6 g/dL — ABNORMAL LOW (ref 13.0–17.0)
MCH: 28.9 pg (ref 26.0–34.0)
MCHC: 30.8 g/dL (ref 30.0–36.0)
MCV: 94 fL (ref 80.0–100.0)
Platelets: 292 10*3/uL (ref 150–400)
RBC: 3.32 MIL/uL — ABNORMAL LOW (ref 4.22–5.81)
RDW: 19.7 % — ABNORMAL HIGH (ref 11.5–15.5)
WBC: 12.1 10*3/uL — ABNORMAL HIGH (ref 4.0–10.5)
nRBC: 0 % (ref 0.0–0.2)

## 2021-03-29 LAB — GLUCOSE, CAPILLARY
Glucose-Capillary: 115 mg/dL — ABNORMAL HIGH (ref 70–99)
Glucose-Capillary: 165 mg/dL — ABNORMAL HIGH (ref 70–99)
Glucose-Capillary: 160 mg/dL — ABNORMAL HIGH (ref 70–99)
Glucose-Capillary: 112 mg/dL — ABNORMAL HIGH (ref 70–99)

## 2021-03-29 LAB — BASIC METABOLIC PANEL
Anion gap: 8 (ref 5–15)
BUN: 13 mg/dL (ref 6–20)
CO2: 28 mmol/L (ref 22–32)
Calcium: 9.1 mg/dL (ref 8.9–10.3)
Chloride: 98 mmol/L (ref 98–111)
Creatinine, Ser: 1.24 mg/dL (ref 0.61–1.24)
GFR, Estimated: >60 mL/min (ref >60)
Glucose, Bld: 126 mg/dL — ABNORMAL HIGH (ref 70–99)
Potassium: 3.6 mmol/L (ref 3.5–5.1)
Sodium: 134 mmol/L — ABNORMAL LOW (ref 135–145)

## 2021-03-29 LAB — MAGNESIUM: Magnesium: 1.3 mg/dL — ABNORMAL LOW (ref 1.7–2.4)

## 2021-03-29 MED ORDER — MAGNESIUM SULFATE 2 GM/50ML IV SOLN
2.0000 g | Freq: Once | INTRAVENOUS | Status: AC
  Administered 2021-03-29: 2 g via INTRAVENOUS
  Filled 2021-03-29: qty 50

## 2021-03-29 MED ORDER — ALUM & MAG HYDROXIDE-SIMETH 200-200-20 MG/5ML PO SUSP
30.0000 mL | Freq: Once | ORAL | Status: AC
  Administered 2021-03-29: 30 mL via ORAL
  Filled 2021-03-29: qty 30

## 2021-03-29 MED ORDER — GABAPENTIN 100 MG PO CAPS
200.0000 mg | ORAL_CAPSULE | Freq: Two times a day (BID) | ORAL | Status: DC
  Administered 2021-03-29 – 2021-03-30 (×2): 200 mg via ORAL
  Filled 2021-03-29 (×2): qty 2

## 2021-03-29 MED ORDER — LIDOCAINE VISCOUS HCL 2 % MT SOLN
15.0000 mL | Freq: Once | OROMUCOSAL | Status: DC
  Filled 2021-03-29: qty 15

## 2021-03-29 NOTE — Progress Notes (Addendum)
Physical Therapy Treatment Patient Details Name: Bruce Hunter MRN: [id number] DOB: Apr 19, 1976 Today's Date: 03/29/2021    History of Present Illness Pt adm to [facility] 6/30 with acute hypoxic respiratory failure with sepsis. Pt with pneumocystis carinii pneumonia. Pt intubated 7/1 and trached 7/13. Remains on trach collar. Pt on CRRT 7/2- 7/15. Transferred to [facility] on 7/16.  PMH - HIV/AIDS diagnosed 02/2021, CKD, asthma, obesity, DM2    PT Comments    Pt admitted with above diagnosis. Pt was able to assist with scoot pivot transfer to chair with min assist intiially to scoot and mod assist toward end of scoot transfer as pt was fatiguing.  Pt was able to lift with UES to assist with transfer to clear buttocks.  Attempt to stand unsuccessful today as pt had left foot pain and could not stand pt with +1 mod assist.  Pt continues to make progress.  Met 6/6 goals.  Goals revised. Pt should progress well on Rehab and is motivated to get stronger.  Pt currently with functional limitations due to balance and endurance deficits. Pt will benefit from skilled PT to increase their independence and safety with mobility to allow discharge to the venue listed below.      Follow Up Recommendations  CIR     Equipment Recommendations  Wheelchair cushion (measurements PT);Wheelchair (measurements PT);Hospital bed;Other (comment)    Recommendations for Other Services Rehab consult     Precautions / Restrictions Precautions Precautions: Fall Precaution Comments: trach with PMV, foley Restrictions Weight Bearing Restrictions: No    Mobility  Bed Mobility Overal bed mobility: Needs Assistance Bed Mobility: Rolling Rolling: Min guard Sidelying to sit: Mod assist Supine to sit: Mod assist     General bed mobility comments: Pt needed assist to elevate trunk but once given a little assist, able to come to EOB the rest of the way.    Transfers Overall transfer level: Needs assistance Equipment used:  Rolling walker (2 wheeled);None Transfers: Sit to/from Stand;Lateral/Scoot Transfers Sit to Stand: From elevated surface;Total assist;Max assist        Lateral/Scoot Transfers: Mod assist;From elevated surface;Min assist General transfer comment: Pt was unable to stand to feet even with total assist and could only clear buttocks off bed a few inches.  Pt c/o left foot pain.  Tried to stand to RW but again foot hurting.  Dropped arm to recliner and pt scooted to his right to the chair with min to mod assist.  Pt did lift buttocks and scoot and was assisting quite a bit, needing min assist initially and then mod toward end of the scoot.  Ambulation/Gait             General Gait Details: NT   Stairs             Wheelchair Mobility    Modified Rankin (Stroke Patients Only)       Balance Overall balance assessment: Needs assistance Sitting-balance support: No upper extremity supported;Feet supported Sitting balance-Leahy Scale: Fair Sitting balance - Comments: Initially assist due to posterior lean but then able to maintain static.   Standing balance support: Bilateral upper extremity supported Standing balance-Leahy Scale: Poor Standing balance comment: Unable to clear buttocks today as he was having difficulty placing weight on the left foot.                            Cognition Arousal/Alertness: Awake/alert Behavior During Therapy: WFL for tasks assessed/performed Overall Cognitive  Status: Impaired/Different from baseline Area of Impairment: Awareness;Safety/judgement;Following commands                   Current Attention Level: Selective Memory: Decreased short-term memory Following Commands: Follows one step commands inconsistently;Follows one step commands with increased time Safety/Judgement: Decreased awareness of safety;Decreased awareness of deficits Awareness: Emergent Problem Solving: Requires verbal cues;Requires tactile cues         Exercises General Exercises - Lower Extremity Ankle Circles/Pumps: AAROM;5 reps Long Arc Quad: AROM;Both;Seated;10 reps    General Comments General comments (skin integrity, edema, etc.): VSS on 28% trach collar      Pertinent Vitals/Pain Pain Assessment: Faces Faces Pain Scale: Hurts even more Pain Location: left foot Pain Descriptors / Indicators: Guarding;Tender Pain Intervention(s): Limited activity within patient's tolerance;Monitored during session;Repositioned;Patient requesting pain meds-RN notified    Home Living                      Prior Function            PT Goals (current goals can now be found in the care plan section) Acute Rehab PT Goals Patient Stated Goal: to get stronger and go home PT Goal Formulation: With patient Time For Goal Achievement: 04/12/21 Potential to Achieve Goals: Fair Progress towards PT goals: Progressing toward goals    Frequency    Min 3X/week      PT Plan Current plan remains appropriate    Co-evaluation              AM-PAC PT "6 Clicks" Mobility   Outcome Measure  Help needed turning from your back to your side while in a flat bed without using bedrails?: A Lot Help needed moving from lying on your back to sitting on the side of a flat bed without using bedrails?: A Lot Help needed moving to and from a bed to a chair (including a wheelchair)?: A Lot Help needed standing up from a chair using your arms (e.g., wheelchair or bedside chair)?: Total Help needed to walk in hospital room?: Total Help needed climbing 3-5 steps with a railing? : Total 6 Click Score: 9    End of Session Equipment Utilized During Treatment: Gait belt;Oxygen (trach collar in place) Activity Tolerance: Patient tolerated treatment well Patient left: in bed;with call bell/phone within reach;with bed alarm set Nurse Communication: Mobility status PT Visit Diagnosis: Muscle weakness (generalized) (M62.81);Other abnormalities of  gait and mobility (R26.89)     Time: 1000-1023 PT Time Calculation (min) (ACUTE ONLY): 23 min  Charges:  $Therapeutic Exercise: 8-22 mins $Therapeutic Activity: 8-22 mins                     Bruce Hunter M,PT Acute Rehab Services [phone] (pager)    Alvira Philips 03/29/2021, 11:08 AM

## 2021-03-29 NOTE — Progress Notes (Signed)
PROGRESS NOTE    Bruce Hunter  ZCH:[id number] DOB: 1976/07/09 DOA: 03/04/2021 PCP: Elwyn Reach, MD   Brief Narrative: Bruce Hunter is a 45 y.o. male with a history of HIV/AIDS, chronic pain, diabetes mellitus type 2 with diabetic neuropathy, esophageal candidiasis, diverticulosis with recent GI bleeding.  Patient presented secondary to acute respiratory failure with hypoxia.  Patient required ICU admission and intubation.  While admitted, patient developed severe AKI requiring initiation of CRRT.  Infectious disease was consulted as well for diagnosis of PCP.  Work-up for possible meningitis was negative.  Patient was transitioned from ETT to tracheostomy and was eventually successfully taken off CRRT.  Admission has also been complicated by recurrent anemia requiring multiple transfusions.   Assessment & Plan:   Principal Problem:   AIDS (acquired immune deficiency syndrome) ([place]) Active Problems:   Acute renal failure (HCC)   Candida esophagitis (HCC)   Multifocal pneumonia   Acute respiratory failure with hypoxemia (HCC)   PCP (pneumocystis carinii pneumonia) (HCC)   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 (HCC)   Elevated LFTs   Septic shock (HCC)   Pressure injury of skin   Acute metabolic encephalopathy   Severe sepsis (HCC)   Altered mental status   Status post tracheostomy ([place])   Acute left ankle pain   Acute pain of left shoulder   Acute respiratory failure with hypoxia Secondary to presumed PCP pneumonia. Patient required intubation on 7/1 and was transitioned to tracheostomy performed on 7/13.  Patient is currently stable on trach collar.  Patient using Passy-Muir valve for speaking and eating per speech therapy recommendations.  Infectious disease on board and is treating patient empirically for PCP with clindamycin, primaquine, prednisone with recommendations for total 21 days of treatment.  Acute metabolic encephalopathy In  setting of infection. Resolved.  AKI on CKD stage IIIb Secondary to ATN. Patient presented with a creatinine of 5.88 and started on CRRT. CRRT stopped on 7/15. Creatinine continues to improve and AKI now appears resolved. -Nephrology recommendations: signed off.  Chronic pain Substance abuse disorder Patient was managed with Precedex in the ICU which was weaned off. Clonidine taper started. Oxycodone also started for pain management. Oxycodone is scheduled. Patient has fentanyl IV prn which is now discontinued. -Oxycodone prn  Anxiety Patient is on Klonopin as an outpatient -Continue Klonopin  HIV -Continue Dovato  Prediabetes Hemoglobin A1c of 5.9% on February 12, 2021.  Rechecked in July with a hemoglobin A1c of 5.4%.  Started on Lantus 5 units and SSI while in the ICU.  Currently on prednisone for PCP treatment. -Continue Lantus and sliding scale insulin  Anemia of chronic disease Acute anemia Anemia appears to be chronic.  Previous baseline of around 9.  Patient has required a total of 5 units of PRBC.  Transfusion for recurrent anemia. Iron panel consistent with anemia of chronic disease. Iron levels low.  Patient with a history of fecal occult blood positive stool in June 2022; at that time upper endoscopy and colonoscopy were significant for scattered diverticula throughout entire colon, internal hemorrhoids, erythematous mucosa of the fundus, duodenitis and candidiasis. Hemoglobin appears to be trending down again. Completed IV iron load. -Transfusion for hemoglobin less than 7  Esophageal candidiasis Seen on EGD in June. Recommendation at that time for Diflucan x7 weeks. Course appears to have completed.  Urinary retention Patient had a foley catheter in the ICU which was removed. Patient developed recurrent urinary retention and foley catheter  replaced on 7/20. Flomax started. -Continue foley catheter and Flomax -Outpatient urology follow-up  Pressure injury Medial/upper  sacrum, POA. Left/right buttocks, unknown if present on admission.  Ankle pain Gout MRI obtained and significant for substantial erosive arthropathy likely from patient's history of gout. Patient was previously on colchicine. Significant ankle pain. Hesitant to start NSAIDs secondary to recent kidney injury requiring renal replacement therapy. -Continue oxycodone -On prednisone for pneumonia  Shoulder pain No passive ROM pain. ROM was full. Unlikely a septic joint. X-ray unremarkable.  Hypokalemia -Supplementation  Hypomagnesemia -Supplementation with IV magnesium again today   DVT prophylaxis: Heparin subcu Code Status:   Code Status: Full Code Family Communication: None at bedside Disposition Plan: Discharge to CIR once bed is available. Medically stable for discharge to CIR.   Consultants:  PCCM Nephrology Infectious disease PM&R  Procedures:    Antimicrobials: Clindamycin IV Dolutegravir Emtricitabine-Tenofovir Lamivudine Linezolid Primaquine Vancomycin IV   Subjective: Still with ankle and left shoulder pain. No other concerns.  Objective: Vitals:   03/29/21 0734 03/29/21 0805 03/29/21 1158 03/29/21 1214  BP:  (!) 150/84  123/71  Pulse: 92 91 81 72  Resp: 13 (!) _0 Temp:  98.8 F (37.1 C)  98.6 F (37 C)  TempSrc:  Oral  Oral  SpO2: 99% 100% 100% 100%  Weight:      Height:        Intake/Output Summary (Last 24 hours) at 03/29/2021 1451 Last data filed at 03/29/2021 1200 Gross per 24 hour  Intake 870 ml  Output --  Net 870 ml    Filed Weights   03/18/21 0500 03/19/21 0500 03/22/21 0600  Weight: 93.8 kg 89.3 kg 79.6 kg    Examination:  General exam: Appears calm and comfortable and in no acute distress. Conversant Respiratory: Mild wheezing. Respiratory effort normal with no intercostal retractions or use of accessory muscles Cardiovascular: S1 & S2 heard, RRR. No murmurs, rubs, gallops or clicks. No edema Gastrointestinal:  Abdomen is non-distended, soft and non-tender. No masses felt. Normal bowel sounds heard Neurologic: No focal neurological deficits Musculoskeletal: No calf tenderness Skin: No cyanosis. No new rashes Psychiatry: Alert and oriented x4. Memory intact. Mood & affect appropriate    Data Reviewed: I have personally reviewed following labs and imaging studies  CBC Lab Results  Component Value Date   WBC 12.1 (H) 03/29/2021   RBC 3.32 (L) 03/29/2021   HGB 9.6 (L) 03/29/2021   HCT 31.2 (L) 03/29/2021   MCV 94.0 03/29/2021   MCH 28.9 03/29/2021   PLT 292 03/29/2021   MCHC 30.8 03/29/2021   RDW 19.7 (H) 03/29/2021   LYMPHSABS 2.1 03/19/2021   MONOABS 2.0 (H) 03/19/2021   EOSABS 0.2 03/19/2021   BASOSABS 0.0 21/11/1279     Last metabolic panel Lab Results  Component Value Date   NA 134 (L) 03/29/2021   K 3.6 03/29/2021   CL 98 03/29/2021   CO2 28 03/29/2021   BUN 13 03/29/2021   CREATININE 1.24 03/29/2021   GLUCOSE 126 (H) 03/29/2021   GFRNONAA >60 03/29/2021   GFRAA >60 05/29/2011   CALCIUM 9.1 03/29/2021   PHOS 3.4 03/28/2021   PROT 6.8 03/22/2021   ALBUMIN 2.2 (L) 03/28/2021   BILITOT 0.6 03/22/2021   ALKPHOS 63 03/22/2021   AST 20 03/22/2021   ALT 13 03/22/2021   ANIONGAP 8 03/29/2021    CBG (last 3)  Recent Labs    03/28/21 2258 03/29/21 0809 03/29/21 1213  GLUCAP 161* 115*  165*      GFR: Estimated Creatinine Clearance: 81 mL/min (by C-G formula based on SCr of 1.24 mg/dL).  Coagulation Profile: Recent Labs  Lab 03/22/21 1531  INR 1.1     No results found for this or any previous visit (from the past 240 hour(s)).       Radiology Studies: DG Shoulder Left Port  Result Date: 03/28/2021 CLINICAL DATA:  Left shoulder pain.  No reported injury. EXAM: LEFT SHOULDER COMPARISON:  None. FINDINGS: There is no evidence of fracture or dislocation. There is no evidence of arthropathy or other focal bone abnormality. Soft tissues are unremarkable.  IMPRESSION: Negative. Electronically Signed   By: Lucienne Capers M.D.   On: 03/28/2021 18:59        Scheduled Meds:  amLODipine  10 mg Oral Daily   chlorhexidine gluconate (MEDLINE KIT)  15 mL Mouth Rinse BID   Chlorhexidine Gluconate Cloth  6 each Topical Daily   clonazePAM  0.5 mg Oral BID   dolutegravir  50 mg Oral Daily   gabapentin  100 mg Oral Q12H   heparin injection (subcutaneous)  5,000 Units Subcutaneous Q8H   hydrALAZINE  25 mg Oral Q8H   insulin aspart  0-15 Units Subcutaneous TID WC   insulin aspart  0-5 Units Subcutaneous QHS   insulin glargine  5 Units Subcutaneous Daily   lamiVUDine  300 mg Oral Daily   lidocaine  1 patch Transdermal Q24H   lidocaine  15 mL Oral Once   mouth rinse  15 mL Mouth Rinse q12n4p   multivitamin with minerals  1 tablet Oral Daily   predniSONE  20 mg Oral Q breakfast   primaquine  30 mg Oral Daily   sodium chloride flush  10-40 mL Intracatheter Q12H   Continuous Infusions:  sodium chloride 250 mL (03/21/21 1408)   sodium chloride 250 mL (03/25/21 0345)   clindamycin (CLEOCIN) IV 900 mg (03/29/21 1409)   dextrose 50 mL/hr at 03/26/21 2337   magnesium sulfate bolus IVPB       LOS: 25 days     Cordelia Poche, MD [facility] 03/29/2021, 2:51 PM  If 7PM-7AM, please contact night-coverage [email or web address]

## 2021-03-29 NOTE — Progress Notes (Signed)
03/29/2021   I have seen and evaluated the patient for tracheoostomy management.  S:  Looks great compared to when I saw him last! Denies pain. Secretions are manageable.   O: Blood pressure (!) 150/84, pulse 91, temperature 98.8 F (37.1 C), temperature source Oral, resp. rate (!) 22, height 5' 10.98" (1.803 m), weight 79.6 kg, SpO2 100 %.  No distress 8-0 cuffed shiley in place Ext without edema Seems to have lost a fair bit of weight since I saw him last  Labs look good  A:  Presumed pneumocystis pneumonia ARDS in AIDS patient complicated by profound deconditioning and delirium requiring tracheostomy.  Overall markedly improving.  P:  - Switch to 6-0 uncuffed shiley - Consider capping trial later in week depending on how he looks - Will follow with you   Erskine Emery MD [facility] Prefer epic messenger for cross cover needs If after hours, please call E-link

## 2021-03-29 NOTE — Progress Notes (Addendum)
Inpatient Rehabilitation Admissions Coordinator  I met at bedside with patient . I await OT treatment today and then will begin insurance Auth for a possible CIR admit this week. Patient is in agreement.  Danne Baxter, RN, MSN Rehab Admissions Coordinator [phone] 03/29/2021 1:12 PM

## 2021-03-29 NOTE — Progress Notes (Signed)
Nutrition Follow-up  DOCUMENTATION CODES:   Not applicable  INTERVENTION:  Continue Magic cup TID with meals, each supplement provides 290 kcal and 9 grams of protein  Continue MVI with minerals daily  NUTRITION DIAGNOSIS:   Increased nutrient needs related to chronic illness (HIV) as evidenced by estimated needs.  ongoing  GOAL:   Patient will meet greater than or equal to 90% of their needs  progressing  MONITOR:   PO intake, Supplement acceptance, Labs, Skin  REASON FOR ASSESSMENT:   Ventilator, Consult Enteral/tube feeding initiation and management  ASSESSMENT:   45 year-old male with medical history of recently diagnosed HIV/AIDS and non-compliant with HAART, DM, asthma, HLD, HTN, gout, and obesity. He presented to the ED d/t shortness of breath, peripheral edema, and productive (tan sputum) cough.  7/15 CRRT stopped  Pt reports ankle/shoulder pain, but otherwise has no complaints. Per RN, PO intake has been varied, though only 3 meal completions documented as 25-50% meal completion. Doing well with supplement.   Weight has not been updated since /18  Medications: Scheduled Meds:  amLODipine  10 mg Oral Daily   chlorhexidine gluconate (MEDLINE KIT)  15 mL Mouth Rinse BID   Chlorhexidine Gluconate Cloth  6 each Topical Daily   clonazePAM  0.5 mg Oral BID   dolutegravir  50 mg Oral Daily   gabapentin  100 mg Oral Q12H   heparin injection (subcutaneous)  5,000 Units Subcutaneous Q8H   hydrALAZINE  25 mg Oral Q8H   insulin aspart  0-15 Units Subcutaneous TID WC   insulin aspart  0-5 Units Subcutaneous QHS   insulin glargine  5 Units Subcutaneous Daily   lamiVUDine  300 mg Oral Daily   lidocaine  1 patch Transdermal Q24H   lidocaine  15 mL Oral Once   mouth rinse  15 mL Mouth Rinse q12n4p   multivitamin with minerals  1 tablet Oral Daily   predniSONE  20 mg Oral Q breakfast   primaquine  30 mg Oral Daily   sodium chloride flush  10-40 mL Intracatheter  Q12H   Continuous Infusions:  sodium chloride 250 mL (03/21/21 1408)   sodium chloride 250 mL (03/25/21 0345)   clindamycin (CLEOCIN) IV 900 mg (03/29/21 0544)   dextrose 50 mL/hr at 03/26/21 2337    Labs:  Recent Labs  Lab 03/26/21 0247 03/27/21 0149 03/28/21 0025 03/28/21 0754  NA 139 137 137  --   K 3.6 3.2* 3.4*  --   CL 107 104 104  --   CO2 _0 --   BUN 34* 23* 17  --   CREATININE 1.40* 1.21 1.20  --   CALCIUM 8.9 8.7* 8.6*  --   MG  --   --   --  1.0*  PHOS 4.0 3.9 3.4  --   GLUCOSE 85 93 113*  --   CBGs 51-700-174  Diet Order:   Diet Order             DIET DYS 3 Room service appropriate? Yes; Fluid consistency: Honey Thick  Diet effective now                   EDUCATION NEEDS:   Not appropriate for education at this time  Skin:  Skin Assessment: Skin Integrity Issues: Skin Integrity Issues:: Stage II, Other (Comment) Stage II: L & R buttocks, sacrum Other: MASD coccyx; MARSI L face from ETT holder  Last BM:  7/24  Height:   Ht Readings  from Last 1 Encounters:  03/17/21 5' 10.98" (1.803 m)    Weight:   Wt Readings from Last 1 Encounters:  03/22/21 79.6 kg    Ideal Body Weight:  78.2 kg  BMI:  Body mass index is 24.49 kg/m.  Estimated Nutritional Needs:   Kcal:  2400-2600 kcal  Protein:  130-145 grams  Fluid:  2.2-2.4 L    Larkin Ina, MS, RD, LDN (she/her/hers) RD pager number and weekend/on-call pager number located in [place].

## 2021-03-29 NOTE — Progress Notes (Signed)
SLP Cancellation Note  Patient Details Name: CHIN CERMENO MRN: [id number] DOB: [birth year]   Cancelled treatment:       Reason Eval/Treat Not Completed: Patient at procedure or test/unavailable (Pt currently working with physical therapy. SLP will follow up.)  Jax Kentner I. Hardin Negus, [place], [place] Office number [phone] Pager [place] 03/29/2021, 10:07 AM

## 2021-03-29 NOTE — Progress Notes (Signed)
Occupational Therapy Treatment Patient Details Name: Bruce Hunter MRN: [id number] DOB: [birth year] Today's Date: 03/29/2021    History of present illness Pt adm to [facility] 6/30 with acute hypoxic respiratory failure with sepsis. Pt with pneumocystis carinii pneumonia. Pt intubated 7/1 and trached 7/13. Remains on trach collar. Pt on CRRT 7/2- 7/15. Transferred to [facility] on 7/16.  PMH - HIV/AIDS diagnosed 02/2021, CKD, asthma, obesity, DM2   OT comments  Pt progressing to OOB mobility bedside. Pt with severe pain in LLE and weakness in BLEs. Pt reports decreased activity tolerance and decreased ability to care for self. Pt's mother in room and very supportive of therapy. Pt performing sit to stand x2 times with use of stedy; min A for power up with bed heavily elevated. Pt unable to stand with stedy and maxA of therapist without bed elevated. Pt set-upA for light grooming and maxA to totalA for LB dressing. Pt motivated to return to PLOF. Pt would greatly benefit from continued OT skilled services. OT following acutely.    Follow Up Recommendations  CIR;Supervision - Intermittent    Equipment Recommendations  Other (comment)    Recommendations for Other Services      Precautions / Restrictions Precautions Precautions: Fall Precaution Comments: trach with PMV, foley Restrictions Weight Bearing Restrictions: No       Mobility Bed Mobility               General bed mobility comments: sitting EOB eating pre and post session    Transfers Overall transfer level: Needs assistance Equipment used: Ambulation equipment used Transfers: Sit to/from Stand Sit to Stand: Min assist;From elevated surface         General transfer comment: stedy    Balance Overall balance assessment: Needs assistance Sitting-balance support: No upper extremity supported;Feet supported Sitting balance-Leahy Scale: Good     Standing balance support: Bilateral upper extremity supported Standing  balance-Leahy Scale: Poor Standing balance comment: Pt use of stedy for standing x2 times + elevated bed                           ADL either performed or assessed with clinical judgement   ADL Overall ADL's : Needs assistance/impaired Eating/Feeding: Set up;Sitting Eating/Feeding Details (indicate cue type and reason): at EOB Grooming: Set up;Sitting Grooming Details (indicate cue type and reason): atEOB                 Toilet Transfer: Minimal assistance;BSC Toilet Transfer Details (indicate cue type and reason): use of stedy; min A for power up with bed heavily elevated. Toileting- Clothing Manipulation and Hygiene: Total assistance Toileting - Clothing Manipulation Details (indicate cue type and reason): unaware of start of BM'     Functional mobility during ADLs: Minimal assistance;Maximal assistance (MinA for power up in sarah stedy, maxA for staff assisting with lateral scoot.) General ADL Comments: Pt with severe pain in LLE and weakness in BLEs. Pt reports decreased activity tolerance and decreased ability to care for self. Pt's mother in room and very supportive of therapy.     Vision       Perception     Praxis      Cognition Arousal/Alertness: Awake/alert Behavior During Therapy: WFL for tasks assessed/performed Overall Cognitive Status: Impaired/Different from baseline Area of Impairment: Awareness;Safety/judgement;Following commands                   Current Attention Level: Selective Memory: Decreased short-term memory Following Commands:  Follows one step commands inconsistently;Follows one step commands with increased time Safety/Judgement: Decreased awareness of safety;Decreased awareness of deficits Awareness: Emergent Problem Solving: Requires verbal cues;Requires tactile cues General Comments: Pt perseverating on paying bills requiring max cues to stop to participate in therapy. pt with poor short termmemory and his mother  present, wrote all of the bill information. Pt willing and able to follow simple commands, but 3 step commands, pt would need directions repeated.        Exercises     Shoulder Instructions       General Comments VSS on 28% trach collar; wearing PSMV for x10 mins of session    Pertinent Vitals/ Pain       Pain Assessment: Faces Faces Pain Scale: Hurts whole lot Pain Location: left foot Pain Descriptors / Indicators: Guarding;Tender Pain Intervention(s): Monitored during session;Repositioned  Home Living                                          Prior Functioning/Environment              Frequency  Min 2X/week        Progress Toward Goals  OT Goals(current goals can now be found in the care plan section)  Progress towards OT goals: Progressing toward goals  Acute Rehab OT Goals Patient Stated Goal: to get stronger and go home OT Goal Formulation: With patient Time For Goal Achievement: 04/01/21 Potential to Achieve Goals: Good  Plan Discharge plan remains appropriate;Equipment recommendations need to be updated;All goals met and education completed, patient discharged from OT services    Co-evaluation                 AM-PAC OT "6 Clicks" Daily Activity     Outcome Measure   Help from another person eating meals?: None Help from another person taking care of personal grooming?: A Little Help from another person toileting, which includes using toliet, bedpan, or urinal?: Total Help from another person bathing (including washing, rinsing, drying)?: A Lot Help from another person to put on and taking off regular upper body clothing?: A Little Help from another person to put on and taking off regular lower body clothing?: A Lot 6 Click Score: 15    End of Session Equipment Utilized During Treatment: Gait belt  OT Visit Diagnosis: Unsteadiness on feet (R26.81);Other abnormalities of gait and mobility (R26.89);Muscle weakness  (generalized) (M62.81);Pain Pain - Right/Left: Left Pain - part of body: Ankle and joints of foot   Activity Tolerance Patient limited by pain;Patient tolerated treatment well   Patient Left in bed;with call bell/phone within reach;with family/visitor present   Nurse Communication Mobility status;Need for lift equipment        Time: 1330-1400 OT Time Calculation (min): 30 min  Charges: OT General Charges $OT Visit: 1 Visit OT Treatments $Self Care/Home Management : 8-22 mins $Therapeutic Activity: 8-22 mins  Jefferey Pica, OTR/L Acute Rehabilitation Services Pager: [phone] Office: [phone]    Shontelle Muska C 03/29/2021, 2:09 PM

## 2021-03-29 NOTE — Progress Notes (Signed)
Subjective:   Feet pain is stable   Antibiotics:  Anti-infectives (From admission, onward)    Start     Dose/Rate Route Frequency Ordered Stop   03/23/21 1000  linezolid (ZYVOX) tablet 600 mg        600 mg Oral Every 12 hours 03/23/21 0002 03/24/21 2151   03/23/21 1000  primaquine tablet 30 mg        30 mg Oral Daily 03/23/21 0002     03/23/21 1000  dolutegravir (TIVICAY) tablet 50 mg        50 mg Oral Daily 03/23/21 0002     03/22/21 1545  lamiVUDine (EPIVIR) tablet 300 mg        300 mg Oral Daily 03/22/21 1446     03/21/21 1430  lamiVUDine (EPIVIR) 10 MG/ML solution 300 mg  Status:  Discontinued        300 mg Per Tube Daily 03/21/21 1330 03/22/21 1446   03/18/21 1530  linezolid (ZYVOX) tablet 600 mg  Status:  Discontinued        600 mg Per Tube Every 12 hours 03/18/21 1431 03/23/21 0002   03/18/21 1515  linezolid (ZYVOX) 100 MG/5ML suspension 600 mg  Status:  Discontinued        600 mg Per Tube Every 12 hours 03/18/21 1427 03/18/21 1431   03/17/21 1000  primaquine tablet 30 mg  Status:  Discontinued        30 mg Per Tube Daily 03/17/21 0856 03/23/21 0002   03/16/21 1000  ceFAZolin (ANCEF) IVPB 1 g/50 mL premix  Status:  Discontinued        1 g 100 mL/hr over 30 Minutes Intravenous Every 12 hours 03/15/21 1251 03/15/21 1301   03/16/21 1000  ceFAZolin (ANCEF) 2 g in sodium chloride 0.9 % 100 mL IVPB  Status:  Discontinued        2 g 200 mL/hr over 30 Minutes Intravenous Every 12 hours 03/15/21 1302 03/18/21 1427   03/13/21 2200  fluconazole (DIFLUCAN) IVPB 400 mg        400 mg 100 mL/hr over 120 Minutes Intravenous Every 24 hours 03/13/21 1047 03/19/21 0031   03/13/21 1800  acyclovir (ZOVIRAX) 900 mg in dextrose 5 % 150 mL IVPB  Status:  Discontinued        900 mg 168 mL/hr over 60 Minutes Intravenous Every 24 hours 03/13/21 0958 03/13/21 1047   03/13/21 1800  acyclovir (ZOVIRAX) 900 mg in dextrose 5 % 150 mL IVPB  Status:  Discontinued        900 mg 168 mL/hr  over 60 Minutes Intravenous Every 12 hours 03/13/21 1047 03/14/21 1700   03/13/21 1000  emtricitabine-tenofovir AF (DESCOVY) 200-25 MG per tablet 1 tablet  Status:  Discontinued        1 tablet Per Tube Daily 03/12/21 1350 03/21/21 1330   03/12/21 2200  vancomycin (VANCOREADY) IVPB 750 mg/150 mL  Status:  Discontinued        750 mg 150 mL/hr over 60 Minutes Intravenous Every 12 hours 03/12/21 1249 03/12/21 1806   03/12/21 2200  clindamycin (CLEOCIN) IVPB 900 mg        900 mg 100 mL/hr over 30 Minutes Intravenous Every 8 hours 03/12/21 1729     03/12/21 1815  primaquine tablet 30 mg  Status:  Discontinued        30 mg Oral Daily 03/12/21 1729 03/17/21 0856   03/12/21 1400  vancomycin (VANCOREADY) IVPB 1000  mg/200 mL        1,000 mg 200 mL/hr over 60 Minutes Intravenous  Once 03/12/21 1249 03/12/21 1540   03/12/21 1400  acyclovir (ZOVIRAX) 900 mg in dextrose 5 % 150 mL IVPB  Status:  Discontinued        900 mg 168 mL/hr over 60 Minutes Intravenous Every 12 hours 03/12/21 1249 03/13/21 0958   03/12/21 1300  cefTRIAXone (ROCEPHIN) 2 g in sodium chloride 0.9 % 100 mL IVPB  Status:  Discontinued        2 g 200 mL/hr over 30 Minutes Intravenous Every 12 hours 03/12/21 1203 03/15/21 1251   03/10/21 2200  fluconazole (DIFLUCAN) IVPB 200 mg  Status:  Discontinued        200 mg 100 mL/hr over 60 Minutes Intravenous Every 24 hours 03/10/21 1333 03/13/21 1047   03/10/21 1800  ceFAZolin (ANCEF) IVPB 2g/100 mL premix  Status:  Discontinued        2 g 200 mL/hr over 30 Minutes Intravenous Every 8 hours 03/10/21 1325 03/12/21 1203   03/09/21 2200  ceFAZolin (ANCEF) IVPB 2g/100 mL premix  Status:  Discontinued        2 g 200 mL/hr over 30 Minutes Intravenous Every 12 hours 03/09/21 1445 03/10/21 1325   03/09/21 2000  ceFAZolin (ANCEF) IVPB 2g/100 mL premix  Status:  Discontinued        2 g 200 mL/hr over 30 Minutes Intravenous Every 8 hours 03/09/21 1440 03/09/21 1445   03/09/21 1645  dapsone tablet  100 mg  Status:  Discontinued        100 mg Per Tube Daily 03/09/21 1556 03/12/21 1729   03/07/21 2200  primaquine tablet 30 mg  Status:  Discontinued        30 mg Per Tube Every 24 hours 03/07/21 0915 03/09/21 1439   03/07/21 1000  dolutegravir (TIVICAY) tablet 50 mg  Status:  Discontinued        50 mg Per Tube Daily 03/06/21 1132 03/23/21 0002   03/07/21 1000  lamiVUDine (EPIVIR) tablet 150 mg  Status:  Discontinued        150 mg Per Tube Daily 03/06/21 1324 03/12/21 1350   03/06/21 2200  fluconazole (DIFLUCAN) IVPB 400 mg  Status:  Discontinued        400 mg 100 mL/hr over 120 Minutes Intravenous Every 24 hours 03/06/21 1251 03/10/21 1333   03/06/21 1400  ceFEPIme (MAXIPIME) 2 g in sodium chloride 0.9 % 100 mL IVPB  Status:  Discontinued        2 g 200 mL/hr over 30 Minutes Intravenous Every 12 hours 03/06/21 1251 03/09/21 1439   03/05/21 2130  fluconazole (DIFLUCAN) IVPB 200 mg  Status:  Discontinued        200 mg 100 mL/hr over 60 Minutes Intravenous Every 24 hours 03/05/21 0810 03/06/21 1251   03/05/21 1000  dolutegravir (TIVICAY) tablet 50 mg  Status:  Discontinued        50 mg Oral Daily 03/04/21 2214 03/06/21 1132   03/05/21 1000  lamiVUDine (EPIVIR) tablet 300 mg  Status:  Discontinued        300 mg Oral Daily 03/04/21 2217 03/04/21 2217   03/05/21 1000  lamiVUDine (EPIVIR) tablet 150 mg  Status:  Discontinued        150 mg Oral Daily 03/04/21 2217 03/06/21 1324   03/04/21 2200  clindamycin (CLEOCIN) IVPB 900 mg  Status:  Discontinued        900  mg 100 mL/hr over 30 Minutes Intravenous Every 8 hours 03/04/21 2157 03/09/21 1439   03/04/21 2200  primaquine tablet 30 mg  Status:  Discontinued        30 mg Oral Every 24 hours 03/04/21 2157 03/07/21 0915   03/04/21 2145  vancomycin (VANCOREADY) IVPB 1500 mg/300 mL        1,500 mg 150 mL/hr over 120 Minutes Intravenous  Once 03/04/21 2144 03/05/21 0050   03/04/21 2145  ceFEPIme (MAXIPIME) 2 g in sodium chloride 0.9 % 100 mL IVPB   Status:  Discontinued        2 g 200 mL/hr over 30 Minutes Intravenous Every 24 hours 03/04/21 2144 03/06/21 1251   03/04/21 2143  vancomycin variable dose per unstable renal function (pharmacist dosing)  Status:  Discontinued         Does not apply See admin instructions 03/04/21 2144 03/06/21 0813   03/04/21 2130  fluconazole (DIFLUCAN) IVPB 400 mg  Status:  Discontinued        400 mg 100 mL/hr over 120 Minutes Intravenous Every 24 hours 03/04/21 2126 03/05/21 0810   03/04/21 1915  cefTRIAXone (ROCEPHIN) 1 g in sodium chloride 0.9 % 100 mL IVPB        1 g 200 mL/hr over 30 Minutes Intravenous  Once 03/04/21 1914 03/04/21 2045   03/04/21 1915  azithromycin (ZITHROMAX) 500 mg in sodium chloride 0.9 % 250 mL IVPB        500 mg 250 mL/hr over 60 Minutes Intravenous  Once 03/04/21 1914 03/04/21 2201       Medications: Scheduled Meds:  amLODipine  10 mg Oral Daily   chlorhexidine gluconate (MEDLINE KIT)  15 mL Mouth Rinse BID   Chlorhexidine Gluconate Cloth  6 each Topical Daily   clonazePAM  0.5 mg Oral BID   dolutegravir  50 mg Oral Daily   gabapentin  100 mg Oral Q12H   heparin injection (subcutaneous)  5,000 Units Subcutaneous Q8H   hydrALAZINE  25 mg Oral Q8H   insulin aspart  0-15 Units Subcutaneous TID WC   insulin aspart  0-5 Units Subcutaneous QHS   insulin glargine  5 Units Subcutaneous Daily   lamiVUDine  300 mg Oral Daily   lidocaine  1 patch Transdermal Q24H   lidocaine  15 mL Oral Once   mouth rinse  15 mL Mouth Rinse q12n4p   multivitamin with minerals  1 tablet Oral Daily   predniSONE  20 mg Oral Q breakfast   primaquine  30 mg Oral Daily   sodium chloride flush  10-40 mL Intracatheter Q12H   Continuous Infusions:  sodium chloride 250 mL (03/21/21 1408)   sodium chloride 250 mL (03/25/21 0345)   clindamycin (CLEOCIN) IV 900 mg (03/29/21 0544)   dextrose 50 mL/hr at 03/26/21 2337   PRN Meds:.acetaminophen, albuterol, docusate sodium, heparin, labetalol, lip  balm, midazolam, ondansetron (ZOFRAN) IV, oxyCODONE, polyethylene glycol, sodium chloride flush    Objective: Weight change:   Intake/Output Summary (Last 24 hours) at 03/29/2021 1252 Last data filed at 03/29/2021 0846 Gross per 24 hour  Intake 390 ml  Output --  Net 390 ml    Blood pressure 123/71, pulse 72, temperature 98.6 F (37 C), temperature source Oral, resp. rate 20, height 5' 10.98" (1.803 m), weight 79.6 kg, SpO2 100 %. Temp:  [98 F (36.7 C)-98.8 F (37.1 C)] 98.6 F (37 C) (07/25 1214) Pulse Rate:  [72-102] 72 (07/25 1214) Resp:  [13-22] 20 (07/25 1214)  BP: (123-231)/(71-132) 123/71 (07/25 1214) SpO2:  [97 %-100 %] 100 % (07/25 1214) FiO2 (%):  [28 %] 28 % (07/25 1158)  Physical Exam: Physical Exam Constitutional:      Appearance: He is well-developed. He is obese.  HENT:     Head: Normocephalic and atraumatic.     Nose: Nose normal.  Eyes:     General: No scleral icterus.       Right eye: No discharge.        Left eye: No discharge.     Conjunctiva/sclera: Conjunctivae normal.  Neck:     Trachea: Tracheostomy present.  Cardiovascular:     Heart sounds: No murmur heard.   No friction rub. No gallop.  Pulmonary:     Effort: Pulmonary effort is normal. No respiratory distress.     Breath sounds: No stridor. Rhonchi present. No wheezing.  Abdominal:     General: There is no distension.     Palpations: Abdomen is soft.  Musculoskeletal:     Cervical back: Normal range of motion and neck supple.     Right foot: Tenderness present.     Left foot: Tenderness present.  Skin:    General: Skin is warm and dry.  Neurological:     Mental Status: He is alert and oriented to person, place, and time.  Psychiatric:        Mood and Affect: Mood normal. Mood is not anxious.        Behavior: Behavior normal.        Thought Content: Thought content normal.        Judgment: Judgment normal.     CBC:    BMET Recent Labs    03/28/21 0025 03/29/21 0921  NA  137 134*  K 3.4* 3.6  CL 104 98  CO2 24 28  GLUCOSE 113* 126*  BUN 17 13  CREATININE 1.20 1.24  CALCIUM 8.6* 9.1      Liver Panel  Recent Labs    03/27/21 0149 03/28/21 0025  ALBUMIN 2.1* 2.2*        Sedimentation Rate No results for input(s): ESRSEDRATE in the last 72 hours. C-Reactive Protein No results for input(s): CRP in the last 72 hours.  Micro Results: Recent Results (from the past 720 hour(s))  Culture, blood (routine x 2)     Status: None   Collection Time: 03/04/21  6:14 PM   Specimen: BLOOD LEFT FOREARM  Result Value Ref Range Status   Specimen Description   Final    BLOOD LEFT FOREARM Performed at [facility] [address], [place], [place] [address]    Special Requests   Final    BOTTLES DRAWN AEROBIC AND ANAEROBIC Blood Culture adequate volume Performed at [facility] [address], [place], [place] [address]    Culture   Final    NO GROWTH 5 DAYS Performed at [facility], [place] [address], [place], [place] [address]    Report Status 03/09/2021 FINAL  Final  Resp Panel by RT-PCR (Flu A&B, Covid) Nasopharyngeal Swab     Status: None   Collection Time: 03/04/21  7:52 PM   Specimen: Nasopharyngeal Swab; Nasopharyngeal(NP) swabs in vial transport medium  Result Value Ref Range Status   SARS Coronavirus 2 by RT PCR NEGATIVE NEGATIVE Final    Comment: (NOTE) SARS-CoV-2 target nucleic acids are NOT DETECTED.  The SARS-CoV-2 RNA is generally detectable in upper respiratory specimens during the acute phase of infection. The  lowest concentration of SARS-CoV-2 viral copies this assay can detect is 138 copies/mL. A negative result does not preclude SARS-Cov-2 infection and should not be used as the sole basis for treatment or other patient management decisions. A negative result may occur with  improper specimen collection/handling, submission of specimen other than nasopharyngeal swab, presence of  viral mutation(s) within the areas targeted by this assay, and inadequate number of viral copies(<138 copies/mL). A negative result must be combined with clinical observations, patient history, and epidemiological information. The expected result is Negative.  Fact Sheet for Patients:  EntrepreneurPulse.com.au  Fact Sheet for Healthcare Providers:  IncredibleEmployment.be  This test is no t yet approved or cleared by the [place] FDA and  has been authorized for detection and/or diagnosis of SARS-CoV-2 by FDA under an Emergency Use Authorization (EUA). This EUA will remain  in effect (meaning this test can be used) for the duration of the COVID-19 declaration under Section 564(b)(1) of the Act, 21 U.S.C.section 360bbb-3(b)(1), unless the authorization is terminated  or revoked sooner.       Influenza A by PCR NEGATIVE NEGATIVE Final   Influenza B by PCR NEGATIVE NEGATIVE Final    Comment: (NOTE) The Xpert Xpress SARS-CoV-2/FLU/RSV plus assay is intended as an aid in the diagnosis of influenza from Nasopharyngeal swab specimens and should not be used as a sole basis for treatment. Nasal washings and aspirates are unacceptable for Xpert Xpress SARS-CoV-2/FLU/RSV testing.  Fact Sheet for Patients: EntrepreneurPulse.com.au  Fact Sheet for Healthcare Providers: IncredibleEmployment.be  This test is not yet approved or cleared by the [place] FDA and has been authorized for detection and/or diagnosis of SARS-CoV-2 by FDA under an Emergency Use Authorization (EUA). This EUA will remain in effect (meaning this test can be used) for the duration of the COVID-19 declaration under Section 564(b)(1) of the Act, 21 U.S.C. section 360bbb-3(b)(1), unless the authorization is terminated or revoked.  Performed at [facility], [place] [address], [place], [place] [address]   MRSA PCR  Screening     Status: None   Collection Time: 03/05/21 10:41 AM  Result Value Ref Range Status   MRSA by PCR NEGATIVE NEGATIVE Final    Comment:        The GeneXpert MRSA Assay (FDA approved for NASAL specimens only), is one component of a comprehensive MRSA colonization surveillance program. It is not intended to diagnose MRSA infection nor to guide or monitor treatment for MRSA infections. Performed at [facility], [place] [address] [place], [place] [address]   Respiratory (~20 pathogens) panel by PCR     Status: None   Collection Time: 03/05/21 10:42 AM   Specimen: Nasopharyngeal Swab; Respiratory  Result Value Ref Range Status   Adenovirus NOT DETECTED NOT DETECTED Final   Coronavirus 229E NOT DETECTED NOT DETECTED Final    Comment: (NOTE) The Coronavirus on the Respiratory Panel, DOES NOT test for the novel  Coronavirus (2019 nCoV)    Coronavirus HKU1 NOT DETECTED NOT DETECTED Final   Coronavirus NL63 NOT DETECTED NOT DETECTED Final   Coronavirus OC43 NOT DETECTED NOT DETECTED Final   Metapneumovirus NOT DETECTED NOT DETECTED Final   Rhinovirus / Enterovirus NOT DETECTED NOT DETECTED Final   Influenza A NOT DETECTED NOT DETECTED Final   Influenza B NOT DETECTED NOT DETECTED Final   Parainfluenza Virus 1 NOT DETECTED NOT DETECTED Final   Parainfluenza Virus 2 NOT DETECTED NOT DETECTED Final   Parainfluenza Virus 3 NOT DETECTED NOT  DETECTED Final   Parainfluenza Virus 4 NOT DETECTED NOT DETECTED Final   Respiratory Syncytial Virus NOT DETECTED NOT DETECTED Final   Bordetella pertussis NOT DETECTED NOT DETECTED Final   Bordetella Parapertussis NOT DETECTED NOT DETECTED Final   Chlamydophila pneumoniae NOT DETECTED NOT DETECTED Final   Mycoplasma pneumoniae NOT DETECTED NOT DETECTED Final    Comment: Performed at [facility], [place] [address] [place], [place] [address]  Gastrointestinal Panel by PCR , Stool     Status: None   Collection Time:  03/05/21  6:28 PM   Specimen: Stool  Result Value Ref Range Status   Campylobacter species NOT DETECTED NOT DETECTED Final   Plesimonas shigelloides NOT DETECTED NOT DETECTED Final   Salmonella species NOT DETECTED NOT DETECTED Final   Yersinia enterocolitica NOT DETECTED NOT DETECTED Final   Vibrio species NOT DETECTED NOT DETECTED Final   Vibrio cholerae NOT DETECTED NOT DETECTED Final   Enteroaggregative E coli (EAEC) NOT DETECTED NOT DETECTED Final   Enteropathogenic E coli (EPEC) NOT DETECTED NOT DETECTED Final   Enterotoxigenic E coli (ETEC) NOT DETECTED NOT DETECTED Final   Shiga like toxin producing E coli (STEC) NOT DETECTED NOT DETECTED Final   Shigella/Enteroinvasive E coli (EIEC) NOT DETECTED NOT DETECTED Final   Cryptosporidium NOT DETECTED NOT DETECTED Final   Cyclospora cayetanensis NOT DETECTED NOT DETECTED Final   Entamoeba histolytica NOT DETECTED NOT DETECTED Final   Giardia lamblia NOT DETECTED NOT DETECTED Final   Adenovirus F40/41 NOT DETECTED NOT DETECTED Final   Astrovirus NOT DETECTED NOT DETECTED Final   Norovirus GI/GII NOT DETECTED NOT DETECTED Final   Rotavirus A NOT DETECTED NOT DETECTED Final   Sapovirus (I, II, IV, and V) NOT DETECTED NOT DETECTED Final    Comment: Performed at [facility], [place] [place], [place] [address]  Pneumocystis smear by DFA     Status: None   Collection Time: 03/06/21  1:09 PM   Specimen: Sputum; Respiratory  Result Value Ref Range Status   Specimen Source-PJSRC SPUTUM  Final   Pneumocystis jiroveci Ag NEGATIVE  Final    Comment: Performed at [facility] [place] Performed at [facility] Performed at [facility], [place] [address] [place], [place] [address]   Culture, Respiratory w Gram Stain     Status: None   Collection Time: 03/06/21  1:10 PM   Specimen: Tracheal Aspirate; Respiratory  Result Value Ref Range Status   Specimen Description   Final    TRACHEAL  ASPIRATE Performed at [facility] [address] [place], [place] [address]    Special Requests   Final    Immunocompromised Performed at [facility], [place] [address], [place], [place] [address]    Gram Stain   Final    FEW WBC PRESENT,BOTH PMN AND MONONUCLEAR RARE SQUAMOUS EPITHELIAL CELLS PRESENT RARE GRAM POSITIVE RODS RARE GRAM POSITIVE COCCI IN PAIRS Performed at [facility], [place] [address] [place], [place] [address]    Culture RARE STAPHYLOCOCCUS AUREUS  Final   Report Status 03/09/2021 FINAL  Final   Organism ID, Bacteria STAPHYLOCOCCUS AUREUS  Final      Susceptibility   Staphylococcus aureus - MIC*    CIPROFLOXACIN <=0.5 SENSITIVE Sensitive     ERYTHROMYCIN <=0.25 SENSITIVE Sensitive     GENTAMICIN <=0.5 SENSITIVE Sensitive     OXACILLIN <=0.25 SENSITIVE Sensitive     TETRACYCLINE >=16  RESISTANT Resistant     VANCOMYCIN <=0.5 SENSITIVE Sensitive     TRIMETH/SULFA <=10 SENSITIVE Sensitive     CLINDAMYCIN <=0.25 SENSITIVE Sensitive     RIFAMPIN <=0.5 SENSITIVE Sensitive     Inducible Clindamycin NEGATIVE Sensitive     * RARE STAPHYLOCOCCUS AUREUS  Blastomyces Antigen     Status: None   Collection Time: 03/12/21  5:55 PM   Specimen: Blood  Result Value Ref Range Status   Blastomyces Antigen None Detected None Detected ng/mL Final    Comment: (NOTE) Results reported as ng/mL in 0.2 - 14.7 ng/mL range Results above the limit of detection but below 0.2 ng/mL are reported as 'Positive, Below the Limit of Quantification' Results above 14.7 ng/mL are reported as 'Positive, Above the Limit of Quantification'    Specimen Type SERUM  Final    Comment: (NOTE) Performed At: [facility] [place], [place] [id number] Bruce Donath MD VO:[id number]   Virus culture     Status: None   Collection Time: 03/12/21  7:08 PM   Specimen: CSF  Result Value Ref Range Status   Viral Culture No virus  isolated.  Corrected    Comment: (NOTE) Performed At: [facility] [address] [place], [place] [id number] Rush Farmer MD [id number] CORRECTED ON 07/20 AT 1660: PREVIOUSLY REPORTED AS Comment    Source of Sample BLOOD  Final    Comment: Performed at [facility] [address], [place], [place] [address]  Virus culture     Status: None   Collection Time: 03/13/21  4:42 PM   Specimen: PATH Cytology CSF; Cerebrospinal Fluid  Result Value Ref Range Status   Viral Culture No virus isolated.  Corrected    Comment: (NOTE) Please indicate source of specimen on all future request forms. Performed At: [facility] [address] [place], [place] [id number] Rush Farmer MD DD:[id number] CORRECTED ON 07/19 AT 1036: PREVIOUSLY REPORTED AS Comment    Source of Sample CSF  Final    Comment: Performed at [facility], [place] [address] [place], [place] [address]  CSF culture w Gram Stain     Status: None   Collection Time: 03/13/21  4:42 PM   Specimen: PATH Cytology CSF; Cerebrospinal Fluid  Result Value Ref Range Status   Specimen Description   Final    CSF Performed at [facility] [address] [place], [place] [address]    Special Requests   Final    NONE Performed at [facility], [place] [address] [place], [place] [address]    Gram Stain   Final    NO ORGANISMS SEEN Gram Stain Report Called to,Read Back By and Verified With: ETHAN RN AT 7371 ON 03/13/21 BY S.VANHOORNE Performed at [facility], [place] [address] [place], [place] [address]    Culture   Final    NO GROWTH 3 DAYS Performed at [facility], [place] [address] [place],  [address]    Report Status 03/17/2021 FINAL  Final  Fungus Culture With Stain     Status: Abnormal   Collection Time: 03/13/21  4:42 PM   Specimen: PATH Cytology CSF; Cerebrospinal Fluid  Result Value Ref Range Status    Fungus Stain QNSTST (A)  Final    Comment: (NOTE) Test not performed. Insufficient specimen to perform or complete analysis.      Darrick Meigs S. was notified 03/17/2021.    Fungus (Mycology) Culture QNSTST (  A)  Final    Comment: (NOTE) Test not performed. Insufficient specimen to perform or complete analysis.      Darrick Meigs S. was notified 03/17/2021. Performed At: [facility] [place], [place] [id number] Rush Farmer MD [id number]    Fungal Source CSF  Final    Comment: Performed at [facility] [address] [place], [place] [address]  Culture, fungus without smear     Status: None (Preliminary result)   Collection Time: 03/13/21  4:42 PM   Specimen: PATH Cytology CSF; Cerebrospinal Fluid  Result Value Ref Range Status   Specimen Description   Final    CSF Performed at [facility] [address] [place], [place] [address]    Special Requests   Final    NONE Performed at [facility], [place] [address] [place], [place] [address]    Culture   Final    NO FUNGUS ISOLATED AFTER 16 DAYS Performed at [facility], [place] [address], [place], [place] [address]    Report Status PENDING  Incomplete  Culture, Respiratory w Gram Stain     Status: None   Collection Time: 03/14/21  3:15 AM   Specimen: Tracheal Aspirate; Respiratory  Result Value Ref Range Status   Specimen Description   Final    TRACHEAL ASPIRATE Performed at [facility] [address], [place], [place] [address]    Special Requests   Final    NONE Performed at [facility], [place] [address] [place], [place] [address]    Gram Stain   Final    MODERATE WBC PRESENT,BOTH PMN AND MONONUCLEAR NO ORGANISMS SEEN    Culture   Final    NO GROWTH 2 DAYS Performed at [facility], [place] [address] [place], [place] [address]    Report Status 03/16/2021 FINAL  Final  Culture, Respiratory  w Gram Stain     Status: None   Collection Time: 03/16/21  9:40 AM   Specimen: Bronchoalveolar Lavage; Respiratory  Result Value Ref Range Status   Specimen Description   Final    BRONCHIAL ALVEOLAR LAVAGE Performed at [facility] [address] [place], [place] [address]    Special Requests   Final    NONE Performed at [facility], [place] [address] [place], [place] [address]    Gram Stain   Final    FEW WBC PRESENT,BOTH PMN AND MONONUCLEAR NO ORGANISMS SEEN    Culture   Final    RARE Normal respiratory flora-no Staph aureus or Pseudomonas seen Performed at [facility], [address] [place], [place] [address]    Report Status 03/18/2021 FINAL  Final  Fungus Culture With Stain     Status: None (Preliminary result)   Collection Time: 03/16/21  9:40 AM   Specimen: Bronchoalveolar Lavage  Result Value Ref Range Status   Fungus Stain Final report  Final    Comment: (NOTE) Performed At: [facility] [place], [place] [id number] Rush Farmer MD [id number]    Fungus (Mycology) Culture PENDING  Incomplete   Fungal Source BRONCHIAL ALVEOLAR LAVAGE  Final    Comment: Performed at [facility], [place] [address], [place], [place] [address]  Pneumocystis smear by DFA     Status: None   Collection Time: 03/16/21  9:40 AM   Specimen: Bronchoalveolar Lavage; Respiratory  Result Value Ref Range Status  Specimen Source-PJSRC BRONCHIAL ALVEOLAR LAVAGE  Final   Pneumocystis jiroveci Ag NEGATIVE  Final    Comment: Performed at [facility] [address], [place], [place] [address]  Fungus Culture Result     Status: None   Collection Time: 03/16/21  9:40 AM  Result Value Ref Range Status   Result 1 Comment  Final    Comment: (NOTE) KOH/Calcofluor preparation:  no fungus observed. Performed At: [facility] [place], [place] [id number] Rush Farmer MD  NO:[id number]   Fungus Culture With Stain     Status: None (Preliminary result)   Collection Time: 03/16/21  9:41 AM   Specimen: Bronchoalveolar Lavage  Result Value Ref Range Status   Fungus Stain Final report  Final    Comment: (NOTE) Performed At: [facility] [address], [place] [id number] Rush Farmer MD [id number]    Fungus (Mycology) Culture PENDING  Incomplete   Fungal Source BRONCHIAL ALVEOLAR LAVAGE  Final    Comment: Performed at [facility], [place] [address] [place], [place] [address]  Culture, Respiratory w Gram Stain     Status: None   Collection Time: 03/16/21  9:41 AM   Specimen: Bronchoalveolar Lavage; Respiratory  Result Value Ref Range Status   Specimen Description   Final    BRONCHIAL ALVEOLAR LAVAGE Performed at [facility] [address] [place], [place] [address]    Special Requests   Final    NONE Performed at [facility], [place] [address], [place], [place] [address]    Gram Stain   Final    NO WBC SEEN NO ORGANISMS SEEN Performed at [facility], [place] [address], [place], [place] [address]    Culture RARE STAPHYLOCOCCUS EPIDERMIDIS  Final   Report Status 03/18/2021 FINAL  Final   Organism ID, Bacteria STAPHYLOCOCCUS EPIDERMIDIS  Final      Susceptibility   Staphylococcus epidermidis - MIC*    CIPROFLOXACIN 4 RESISTANT Resistant     ERYTHROMYCIN >=8 RESISTANT Resistant     GENTAMICIN <=0.5 SENSITIVE Sensitive     OXACILLIN >=4 RESISTANT Resistant     TETRACYCLINE >=16 RESISTANT Resistant     VANCOMYCIN 1 SENSITIVE Sensitive     TRIMETH/SULFA 40 SENSITIVE Sensitive     CLINDAMYCIN >=8 RESISTANT Resistant     RIFAMPIN 2 INTERMEDIATE Intermediate     Inducible Clindamycin NEGATIVE Sensitive     * RARE STAPHYLOCOCCUS EPIDERMIDIS  Fungus Culture Result     Status: None   Collection Time: 03/16/21  9:41 AM  Result Value Ref Range Status   Result 1 Comment   Final    Comment: (NOTE) KOH/Calcofluor preparation:  no fungus observed. Performed At: [facility] [place], [place] [id number] Rush Farmer MD [id number]     Studies/Results: DG Shoulder Left Port  Result Date: 03/28/2021 CLINICAL DATA:  Left shoulder pain.  No reported injury. EXAM: LEFT SHOULDER COMPARISON:  None. FINDINGS: There is no evidence of fracture or dislocation. There is no evidence of arthropathy or other focal bone abnormality. Soft tissues are unremarkable. IMPRESSION: Negative. Electronically Signed   By: Lucienne Capers M.D.   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 male with HIV/AIDS successfully suppressed on Dovato after initial period of imperfect adherence, admission with what became VDRF, and ARF in context of sepsis  #1  Respiratory failure:  O2 requirements down to 4-5l CCM have seen for tracheostomy  Will have him finish 21 days of PCP rx  Sp rx for MSSA and MRSE VAP  Continue pulmonary toilet  #2 Acute renal failure: he had been on CVVDH but has had remarkable recovery.  Creatinine now normal at 1.24  #3 HIVAIDS: continue Dovato, check HIV VL  #4 Feet pain:appears to be due to chronic gout chagnes  #5 Deconditioning given prolonged hospitalization. Working with  PT/ oT       LOS: 25 days   Alcide Evener 03/29/2021, 12:52 PM

## 2021-03-29 NOTE — Progress Notes (Signed)
SLP Cancellation Note  Patient Details Name: Bruce Hunter MRN: [id number] DOB: May 13, 1976   Cancelled treatment:       Reason Eval/Treat Not Completed:  (Pt was asleep upon SLP's entry. He roused with verbal and tactile stimulation, but exhibited difficulty keeping his eyes open and he ultimately requested that treatment be deferred so that he could rest. PMSV was in place while pt slept and it was removed by SLP since pt indicated that he would be resuming sleep. SLP will follow up on subsequent date.)  Monasia Lair I. Hardin Negus, [place], [place] Office number [phone] Pager [place] 03/29/2021, 4:14 PM

## 2021-03-29 NOTE — Procedures (Signed)
Tracheostomy Change Note  Patient Details:   Name: Bruce Hunter DOB: [birth year] MRN: [id number]    Airway Documentation:      Evaluation  O2 sats: stable throughout Complications: No apparent complications Patient did tolerate procedure well. Bilateral Breath Sounds: Clear, Diminished  Trach changed to #6 cuffless per MD order. ETCO2 detector positive for color change, BBS equal. No complications noted. Trach secured with trach ties.  Vilinda Blanks 03/29/2021, 2:36 PM

## 2021-03-29 NOTE — Progress Notes (Signed)
Pt refused to allow lab tech to draw blood for labs at this time. Lab tech will reschedule for later this morning. Pt is alert and oriented and was educated on why the labs are important. Will notify on-call provider.

## 2021-03-30 ENCOUNTER — Inpatient Hospital Stay (HOSPITAL_COMMUNITY)
Admission: RE | Admit: 2021-03-30 | Discharge: 2021-05-03 | DRG: 091 | Disposition: A | Discharge status: Home / Self Care | Payer: Medicaid Other | Source: Intra-hospital | Attending: Physical Medicine and Rehabilitation | Admitting: Physical Medicine and Rehabilitation

## 2021-03-30 ENCOUNTER — Encounter (HOSPITAL_COMMUNITY): Payer: Self-pay | Admitting: Physical Medicine and Rehabilitation

## 2021-03-30 ENCOUNTER — Other Ambulatory Visit: Payer: Self-pay

## 2021-03-30 ENCOUNTER — Inpatient Hospital Stay (HOSPITAL_COMMUNITY): Payer: Medicaid Other

## 2021-03-30 DIAGNOSIS — M109 Gout, unspecified: Secondary | ICD-10-CM | POA: Diagnosis not present

## 2021-03-30 DIAGNOSIS — L89312 Pressure ulcer of right buttock, stage 2: Secondary | ICD-10-CM | POA: Diagnosis present

## 2021-03-30 DIAGNOSIS — M7989 Other specified soft tissue disorders: Secondary | ICD-10-CM | POA: Diagnosis present

## 2021-03-30 DIAGNOSIS — M25512 Pain in left shoulder: Secondary | ICD-10-CM | POA: Diagnosis present

## 2021-03-30 DIAGNOSIS — E871 Hypo-osmolality and hyponatremia: Secondary | ICD-10-CM | POA: Diagnosis not present

## 2021-03-30 DIAGNOSIS — F17210 Nicotine dependence, cigarettes, uncomplicated: Secondary | ICD-10-CM | POA: Diagnosis present

## 2021-03-30 DIAGNOSIS — M1A9XX1 Chronic gout, unspecified, with tophus (tophi): Secondary | ICD-10-CM | POA: Diagnosis present

## 2021-03-30 DIAGNOSIS — M25421 Effusion, right elbow: Secondary | ICD-10-CM | POA: Diagnosis not present

## 2021-03-30 DIAGNOSIS — R159 Full incontinence of feces: Secondary | ICD-10-CM | POA: Diagnosis present

## 2021-03-30 DIAGNOSIS — Z833 Family history of diabetes mellitus: Secondary | ICD-10-CM

## 2021-03-30 DIAGNOSIS — M25571 Pain in right ankle and joints of right foot: Secondary | ICD-10-CM

## 2021-03-30 DIAGNOSIS — E1142 Type 2 diabetes mellitus with diabetic polyneuropathy: Secondary | ICD-10-CM | POA: Diagnosis present

## 2021-03-30 DIAGNOSIS — R41841 Cognitive communication deficit: Secondary | ICD-10-CM | POA: Diagnosis present

## 2021-03-30 DIAGNOSIS — Z79899 Other long term (current) drug therapy: Secondary | ICD-10-CM

## 2021-03-30 DIAGNOSIS — F419 Anxiety disorder, unspecified: Secondary | ICD-10-CM | POA: Diagnosis present

## 2021-03-30 DIAGNOSIS — G7281 Critical illness myopathy: Secondary | ICD-10-CM | POA: Diagnosis present

## 2021-03-30 DIAGNOSIS — Z6826 Body mass index (BMI) 26.0-26.9, adult: Secondary | ICD-10-CM | POA: Diagnosis not present

## 2021-03-30 DIAGNOSIS — R52 Pain, unspecified: Secondary | ICD-10-CM

## 2021-03-30 DIAGNOSIS — M79601 Pain in right arm: Secondary | ICD-10-CM | POA: Diagnosis not present

## 2021-03-30 DIAGNOSIS — Z20822 Contact with and (suspected) exposure to covid-19: Secondary | ICD-10-CM | POA: Diagnosis present

## 2021-03-30 DIAGNOSIS — Z23 Encounter for immunization: Secondary | ICD-10-CM | POA: Diagnosis not present

## 2021-03-30 DIAGNOSIS — D72829 Elevated white blood cell count, unspecified: Secondary | ICD-10-CM

## 2021-03-30 DIAGNOSIS — Z5329 Procedure and treatment not carried out because of patient's decision for other reasons: Secondary | ICD-10-CM | POA: Diagnosis not present

## 2021-03-30 DIAGNOSIS — B20 Human immunodeficiency virus [HIV] disease: Secondary | ICD-10-CM | POA: Diagnosis present

## 2021-03-30 DIAGNOSIS — M545 Low back pain, unspecified: Secondary | ICD-10-CM | POA: Diagnosis present

## 2021-03-30 DIAGNOSIS — E669 Obesity, unspecified: Secondary | ICD-10-CM | POA: Diagnosis present

## 2021-03-30 DIAGNOSIS — B59 Pneumocystosis: Secondary | ICD-10-CM | POA: Diagnosis present

## 2021-03-30 DIAGNOSIS — R609 Edema, unspecified: Secondary | ICD-10-CM

## 2021-03-30 DIAGNOSIS — E1140 Type 2 diabetes mellitus with diabetic neuropathy, unspecified: Secondary | ICD-10-CM | POA: Diagnosis present

## 2021-03-30 DIAGNOSIS — R339 Retention of urine, unspecified: Secondary | ICD-10-CM | POA: Diagnosis present

## 2021-03-30 DIAGNOSIS — D649 Anemia, unspecified: Secondary | ICD-10-CM | POA: Diagnosis present

## 2021-03-30 DIAGNOSIS — R509 Fever, unspecified: Secondary | ICD-10-CM | POA: Diagnosis not present

## 2021-03-30 DIAGNOSIS — Z79891 Long term (current) use of opiate analgesic: Secondary | ICD-10-CM

## 2021-03-30 DIAGNOSIS — R6510 Systemic inflammatory response syndrome (SIRS) of non-infectious origin without acute organ dysfunction: Secondary | ICD-10-CM

## 2021-03-30 DIAGNOSIS — M609 Myositis, unspecified: Secondary | ICD-10-CM | POA: Diagnosis not present

## 2021-03-30 DIAGNOSIS — J9601 Acute respiratory failure with hypoxia: Secondary | ICD-10-CM | POA: Diagnosis not present

## 2021-03-30 DIAGNOSIS — L89322 Pressure ulcer of left buttock, stage 2: Secondary | ICD-10-CM | POA: Diagnosis present

## 2021-03-30 DIAGNOSIS — E785 Hyperlipidemia, unspecified: Secondary | ICD-10-CM | POA: Diagnosis present

## 2021-03-30 DIAGNOSIS — I10 Essential (primary) hypertension: Secondary | ICD-10-CM | POA: Diagnosis present

## 2021-03-30 DIAGNOSIS — N179 Acute kidney failure, unspecified: Secondary | ICD-10-CM | POA: Diagnosis not present

## 2021-03-30 DIAGNOSIS — Z91011 Allergy to milk products: Secondary | ICD-10-CM

## 2021-03-30 DIAGNOSIS — K219 Gastro-esophageal reflux disease without esophagitis: Secondary | ICD-10-CM | POA: Diagnosis present

## 2021-03-30 DIAGNOSIS — M792 Neuralgia and neuritis, unspecified: Secondary | ICD-10-CM | POA: Diagnosis present

## 2021-03-30 DIAGNOSIS — M60871 Other myositis, right ankle and foot: Secondary | ICD-10-CM | POA: Diagnosis not present

## 2021-03-30 DIAGNOSIS — D509 Iron deficiency anemia, unspecified: Secondary | ICD-10-CM | POA: Diagnosis present

## 2021-03-30 DIAGNOSIS — R5381 Other malaise: Secondary | ICD-10-CM | POA: Diagnosis not present

## 2021-03-30 DIAGNOSIS — M79632 Pain in left forearm: Secondary | ICD-10-CM | POA: Diagnosis present

## 2021-03-30 LAB — GLUCOSE, CAPILLARY
Glucose-Capillary: 99 mg/dL (ref 70–99)
Glucose-Capillary: 159 mg/dL — ABNORMAL HIGH (ref 70–99)
Glucose-Capillary: 109 mg/dL — ABNORMAL HIGH (ref 70–99)
Glucose-Capillary: 94 mg/dL (ref 70–99)

## 2021-03-30 MED ORDER — DIPHENHYDRAMINE HCL 12.5 MG/5ML PO ELIX: 12.5000 mg | ORAL_SOLUTION | Freq: Four times a day (QID) | ORAL | Status: DC | PRN

## 2021-03-30 MED ORDER — PREDNISONE 20 MG PO TABS
20.0000 mg | ORAL_TABLET | Freq: Every day | ORAL | Status: AC
  Administered 2021-04-06 – 2021-04-10 (×2): 20 mg via ORAL
  Filled 2021-03-30: qty 1

## 2021-03-30 MED ORDER — ADULT MULTIVITAMIN W/MINERALS CH
1.0000 | ORAL_TABLET | Freq: Every day | ORAL | Status: DC
  Administered 2021-03-31 – 2021-05-03 (×34): 1 via ORAL
  Filled 2021-03-30 (×34): qty 1

## 2021-03-30 MED ORDER — POLYETHYLENE GLYCOL 3350 17 G PO PACK: 17.0000 g | PACK | Freq: Every day | ORAL | Status: DC | PRN

## 2021-03-30 MED ORDER — CLONAZEPAM 0.5 MG PO TABS
0.5000 mg | ORAL_TABLET | Freq: Two times a day (BID) | ORAL | Status: DC
  Administered 2021-03-30 – 2021-04-05 (×12): 0.5 mg via ORAL
  Filled 2021-03-30 (×12): qty 1

## 2021-03-30 MED ORDER — PROCHLORPERAZINE 25 MG RE SUPP: 12.5000 mg | Freq: Four times a day (QID) | RECTAL | Status: DC | PRN

## 2021-03-30 MED ORDER — ACETAMINOPHEN 325 MG PO TABS
325.0000 mg | ORAL_TABLET | ORAL | Status: DC | PRN
  Administered 2021-03-30 – 2021-04-05 (×27): 650 mg via ORAL
  Administered 2021-04-05: 325 mg via ORAL
  Administered 2021-04-05 – 2021-04-12 (×34): 650 mg via ORAL
  Administered 2021-04-13: 325 mg via ORAL
  Administered 2021-04-13 – 2021-04-15 (×10): 650 mg via ORAL
  Administered 2021-04-15: 325 mg via ORAL
  Administered 2021-04-16 – 2021-04-17 (×8): 650 mg via ORAL
  Administered 2021-04-17: 325 mg via ORAL
  Administered 2021-04-17 – 2021-04-19 (×6): 650 mg via ORAL
  Administered 2021-04-19: 325 mg via ORAL
  Administered 2021-04-19: 650 mg via ORAL
  Administered 2021-04-19: 325 mg via ORAL
  Administered 2021-04-19: 650 mg via ORAL
  Administered 2021-04-20: 325 mg via ORAL
  Administered 2021-04-20 – 2021-04-21 (×10): 650 mg via ORAL
  Administered 2021-04-22: 325 mg via ORAL
  Administered 2021-04-22: 650 mg via ORAL
  Filled 2021-03-30 (×106): qty 2

## 2021-03-30 MED ORDER — GABAPENTIN 100 MG PO CAPS
200.0000 mg | ORAL_CAPSULE | Freq: Two times a day (BID) | ORAL | Status: DC
  Administered 2021-03-30 – 2021-04-10 (×22): 200 mg via ORAL
  Filled 2021-03-30 (×22): qty 2

## 2021-03-30 MED ORDER — LIP MEDEX EX OINT
TOPICAL_OINTMENT | CUTANEOUS | Status: DC | PRN
  Filled 2021-03-30: qty 7

## 2021-03-30 MED ORDER — HYDRALAZINE HCL 25 MG PO TABS
25.0000 mg | ORAL_TABLET | Freq: Three times a day (TID) | ORAL | Status: DC
  Administered 2021-03-30 – 2021-05-03 (×102): 25 mg via ORAL
  Filled 2021-03-30 (×102): qty 1

## 2021-03-30 MED ORDER — LIDOCAINE 5 % EX PTCH
1.0000 | MEDICATED_PATCH | CUTANEOUS | Status: DC
  Administered 2021-03-30 – 2021-05-02 (×27): 1 via TRANSDERMAL
  Filled 2021-03-30 (×30): qty 1

## 2021-03-30 MED ORDER — OXYCODONE HCL 5 MG PO TABS
5.0000 mg | ORAL_TABLET | Freq: Four times a day (QID) | ORAL | Status: DC | PRN
Start: 2021-03-30 — End: 2021-03-30

## 2021-03-30 MED ORDER — CHLORHEXIDINE GLUCONATE 0.12% ORAL RINSE (MEDLINE KIT)
15.0000 mL | Freq: Two times a day (BID) | OROMUCOSAL | Status: DC
  Administered 2021-03-31 – 2021-04-10 (×16): 15 mL via OROMUCOSAL

## 2021-03-30 MED ORDER — TRAZODONE HCL 50 MG PO TABS
25.0000 mg | ORAL_TABLET | Freq: Every evening | ORAL | Status: DC | PRN
Start: 2021-03-30 — End: 2021-05-03
  Administered 2021-03-31 – 2021-05-02 (×30): 50 mg via ORAL
  Filled 2021-03-30 (×33): qty 1

## 2021-03-30 MED ORDER — PREDNISONE 20 MG PO TABS
60.0000 mg | ORAL_TABLET | Freq: Every day | ORAL | Status: AC
  Administered 2021-03-31 – 2021-04-02 (×3): 60 mg via ORAL
  Filled 2021-03-30 (×3): qty 3

## 2021-03-30 MED ORDER — OXYCODONE HCL 5 MG PO TABS: 5.0000 mg | ORAL_TABLET | Freq: Four times a day (QID) | ORAL | Status: DC | PRN

## 2021-03-30 MED ORDER — CLINDAMYCIN PHOSPHATE 900 MG/50ML IV SOLN: 900.0000 mg | Freq: Three times a day (TID) | INTRAVENOUS | Status: DC

## 2021-03-30 MED ORDER — PROCHLORPERAZINE EDISYLATE 10 MG/2ML IJ SOLN: 5.0000 mg | Freq: Four times a day (QID) | INTRAMUSCULAR | Status: DC | PRN

## 2021-03-30 MED ORDER — GABAPENTIN 300 MG PO CAPS: 300.0000 mg | ORAL_CAPSULE | Freq: Three times a day (TID) | ORAL | Status: DC

## 2021-03-30 MED ORDER — GUAIFENESIN-DM 100-10 MG/5ML PO SYRP: 5.0000 mL | ORAL_SOLUTION | Freq: Four times a day (QID) | ORAL | Status: DC | PRN

## 2021-03-30 MED ORDER — INSULIN ASPART 100 UNIT/ML IJ SOLN
0.0000 [IU] | Freq: Three times a day (TID) | INTRAMUSCULAR | Status: DC
  Administered 2021-03-31: 3 [IU] via SUBCUTANEOUS
  Administered 2021-03-31 – 2021-04-01 (×2): 2 [IU] via SUBCUTANEOUS
  Administered 2021-04-01 – 2021-04-02 (×5): 3 [IU] via SUBCUTANEOUS
  Administered 2021-04-03 (×2): 2 [IU] via SUBCUTANEOUS
  Administered 2021-04-03: 5 [IU] via SUBCUTANEOUS
  Administered 2021-04-04: 2 [IU] via SUBCUTANEOUS
  Administered 2021-04-04 – 2021-04-05 (×2): 5 [IU] via SUBCUTANEOUS
  Administered 2021-04-05: 2 [IU] via SUBCUTANEOUS
  Administered 2021-04-06 – 2021-04-07 (×4): 3 [IU] via SUBCUTANEOUS
  Administered 2021-04-07 – 2021-04-10 (×3): 2 [IU] via SUBCUTANEOUS
  Administered 2021-04-10: 3 [IU] via SUBCUTANEOUS
  Administered 2021-04-11 – 2021-04-12 (×3): 2 [IU] via SUBCUTANEOUS
  Administered 2021-04-12: 3 [IU] via SUBCUTANEOUS
  Administered 2021-04-13 – 2021-04-14 (×3): 2 [IU] via SUBCUTANEOUS
  Administered 2021-04-15: 3 [IU] via SUBCUTANEOUS
  Administered 2021-04-15 (×2): 2 [IU] via SUBCUTANEOUS
  Administered 2021-04-17: 3 [IU] via SUBCUTANEOUS
  Administered 2021-04-17 – 2021-04-19 (×3): 2 [IU] via SUBCUTANEOUS
  Administered 2021-04-19: 5 [IU] via SUBCUTANEOUS
  Administered 2021-04-19: 1 [IU] via SUBCUTANEOUS
  Administered 2021-04-21: 3 [IU] via SUBCUTANEOUS
  Administered 2021-04-21: 2 [IU] via SUBCUTANEOUS
  Administered 2021-04-22: 5 [IU] via SUBCUTANEOUS
  Administered 2021-04-22: 2 [IU] via SUBCUTANEOUS
  Administered 2021-04-23 (×2): 3 [IU] via SUBCUTANEOUS
  Administered 2021-04-24: 5 [IU] via SUBCUTANEOUS
  Administered 2021-04-24: 3 [IU] via SUBCUTANEOUS
  Administered 2021-04-24: 2 [IU] via SUBCUTANEOUS
  Administered 2021-04-25: 3 [IU] via SUBCUTANEOUS
  Administered 2021-04-25: 2 [IU] via SUBCUTANEOUS
  Administered 2021-04-26: 3 [IU] via SUBCUTANEOUS
  Administered 2021-04-27: 5 [IU] via SUBCUTANEOUS
  Administered 2021-04-27 (×2): 2 [IU] via SUBCUTANEOUS
  Administered 2021-04-28: 5 [IU] via SUBCUTANEOUS
  Administered 2021-04-28 – 2021-04-29 (×3): 2 [IU] via SUBCUTANEOUS
  Administered 2021-04-29 – 2021-04-30 (×2): 3 [IU] via SUBCUTANEOUS
  Administered 2021-04-30 (×2): 2 [IU] via SUBCUTANEOUS
  Administered 2021-05-01 – 2021-05-02 (×3): 3 [IU] via SUBCUTANEOUS
  Administered 2021-05-02: 2 [IU] via SUBCUTANEOUS
  Administered 2021-05-03: 3 [IU] via SUBCUTANEOUS

## 2021-03-30 MED ORDER — ALBUTEROL SULFATE (2.5 MG/3ML) 0.083% IN NEBU: 2.5000 mg | INHALATION_SOLUTION | RESPIRATORY_TRACT | Status: DC | PRN

## 2021-03-30 MED ORDER — ALUM & MAG HYDROXIDE-SIMETH 200-200-20 MG/5ML PO SUSP: 30.0000 mL | ORAL | Status: DC | PRN

## 2021-03-30 MED ORDER — FLEET ENEMA 7-19 GM/118ML RE ENEM
1.0000 | ENEMA | Freq: Once | RECTAL | Status: DC | PRN
Start: 2021-03-30 — End: 2021-04-12

## 2021-03-30 MED ORDER — PREDNISONE 20 MG PO TABS: 20.0000 mg | ORAL_TABLET | Freq: Every day | ORAL | 0 refills | Status: DC

## 2021-03-30 MED ORDER — PREDNISONE 20 MG PO TABS
20.0000 mg | ORAL_TABLET | Freq: Every day | ORAL | Status: DC
  Administered 2021-03-31 – 2021-04-05 (×6): 20 mg via ORAL
  Filled 2021-03-30 (×6): qty 1

## 2021-03-30 MED ORDER — DOLUTEGRAVIR SODIUM 50 MG PO TABS
50.0000 mg | ORAL_TABLET | Freq: Every day | ORAL | Status: DC
  Administered 2021-03-31 – 2021-05-03 (×34): 50 mg via ORAL
  Filled 2021-03-30 (×35): qty 1

## 2021-03-30 MED ORDER — DAPSONE 100 MG PO TABS: 100.0000 mg | ORAL_TABLET | Freq: Every day | ORAL | Status: DC

## 2021-03-30 MED ORDER — PRIMAQUINE PHOSPHATE 26.3 (15 BASE) MG PO TABS: 30.0000 mg | ORAL_TABLET | Freq: Every day | ORAL | 0 refills | Status: DC

## 2021-03-30 MED ORDER — PREDNISONE 20 MG PO TABS
30.0000 mg | ORAL_TABLET | Freq: Every day | ORAL | Status: AC
  Administered 2021-04-04 – 2021-04-09 (×3): 30 mg via ORAL
  Filled 2021-03-30: qty 1

## 2021-03-30 MED ORDER — ADULT MULTIVITAMIN W/MINERALS CH: 1.0000 | ORAL_TABLET | Freq: Every day | ORAL | Status: DC

## 2021-03-30 MED ORDER — AMLODIPINE BESYLATE 10 MG PO TABS
10.0000 mg | ORAL_TABLET | Freq: Every day | ORAL | Status: DC
  Administered 2021-03-31 – 2021-04-14 (×15): 10 mg via ORAL
  Filled 2021-03-30 (×15): qty 1

## 2021-03-30 MED ORDER — HEPARIN SODIUM (PORCINE) 5000 UNIT/ML IJ SOLN: 5000.0000 [IU] | Freq: Three times a day (TID) | INTRAMUSCULAR | Status: DC

## 2021-03-30 MED ORDER — BISACODYL 10 MG RE SUPP: 10.0000 mg | Freq: Every day | RECTAL | Status: DC | PRN

## 2021-03-30 MED ORDER — CALCIUM CARBONATE ANTACID 500 MG PO CHEW
1.0000 | CHEWABLE_TABLET | Freq: Three times a day (TID) | ORAL | Status: DC | PRN
  Administered 2021-03-31: 200 mg via ORAL
  Filled 2021-03-30: qty 1

## 2021-03-30 MED ORDER — HYDRALAZINE HCL 25 MG PO TABS: 25.0000 mg | ORAL_TABLET | Freq: Three times a day (TID) | ORAL | Status: DC

## 2021-03-30 MED ORDER — ENOXAPARIN SODIUM 40 MG/0.4ML IJ SOSY
40.0000 mg | PREFILLED_SYRINGE | INTRAMUSCULAR | Status: DC
  Administered 2021-03-30 – 2021-04-06 (×8): 40 mg via SUBCUTANEOUS
  Filled 2021-03-30 (×8): qty 0.4

## 2021-03-30 MED ORDER — CLINDAMYCIN PHOSPHATE 900 MG/50ML IV SOLN
900.0000 mg | Freq: Three times a day (TID) | INTRAVENOUS | Status: AC
  Administered 2021-03-30 – 2021-04-01 (×7): 900 mg via INTRAVENOUS
  Filled 2021-03-30 (×7): qty 50

## 2021-03-30 MED ORDER — PREDNISONE 10 MG PO TABS
5.0000 mg | ORAL_TABLET | Freq: Every day | ORAL | Status: AC
  Administered 2021-04-14 – 2021-04-15 (×2): 5 mg via ORAL
  Filled 2021-03-30 (×2): qty 1

## 2021-03-30 MED ORDER — INSULIN GLARGINE-YFGN 100 UNIT/ML ~~LOC~~ SOLN
5.0000 [IU] | Freq: Every day | SUBCUTANEOUS | Status: DC
  Administered 2021-03-31 – 2021-05-03 (×34): 5 [IU] via SUBCUTANEOUS
  Filled 2021-03-30 (×35): qty 0.05

## 2021-03-30 MED ORDER — ORAL CARE MOUTH RINSE
15.0000 mL | Freq: Two times a day (BID) | OROMUCOSAL | Status: DC
  Administered 2021-03-31 – 2021-04-09 (×17): 15 mL via OROMUCOSAL

## 2021-03-30 MED ORDER — PREDNISONE 10 MG PO TABS
10.0000 mg | ORAL_TABLET | Freq: Every day | ORAL | Status: AC
  Administered 2021-04-12 – 2021-04-13 (×2): 10 mg via ORAL
  Filled 2021-03-30 (×3): qty 1

## 2021-03-30 MED ORDER — PREDNISONE 20 MG PO TABS
40.0000 mg | ORAL_TABLET | Freq: Every day | ORAL | Status: AC
  Administered 2021-04-06 – 2021-04-07 (×2): 40 mg via ORAL
  Filled 2021-03-30 (×2): qty 2

## 2021-03-30 MED ORDER — OXYCODONE HCL 5 MG PO TABS
5.0000 mg | ORAL_TABLET | ORAL | Status: DC | PRN
  Administered 2021-03-30 – 2021-04-03 (×23): 10 mg via ORAL
  Administered 2021-04-04 (×2): 5 mg via ORAL
  Administered 2021-04-04 (×2): 10 mg via ORAL
  Administered 2021-04-04: 5 mg via ORAL
  Administered 2021-04-05 – 2021-04-17 (×63): 10 mg via ORAL
  Filled 2021-03-30 (×23): qty 2
  Filled 2021-03-30: qty 1
  Filled 2021-03-30 (×64): qty 2
  Filled 2021-03-30: qty 1
  Filled 2021-03-30: qty 2

## 2021-03-30 MED ORDER — PRIMAQUINE PHOSPHATE 26.3 (15 BASE) MG PO TABS
30.0000 mg | ORAL_TABLET | Freq: Every day | ORAL | Status: AC
  Administered 2021-03-31 – 2021-04-01 (×2): 30 mg via ORAL
  Filled 2021-03-30 (×2): qty 2

## 2021-03-30 MED ORDER — INSULIN ASPART 100 UNIT/ML IJ SOLN
0.0000 [IU] | Freq: Every day | INTRAMUSCULAR | Status: DC
Start: 2021-03-30 — End: 2021-05-03
  Administered 2021-04-21: 2 [IU] via SUBCUTANEOUS

## 2021-03-30 MED ORDER — CALCIUM CARBONATE ANTACID 500 MG PO CHEW: 1.0000 | CHEWABLE_TABLET | Freq: Three times a day (TID) | ORAL | Status: DC | PRN

## 2021-03-30 MED ORDER — PROCHLORPERAZINE MALEATE 5 MG PO TABS: 5.0000 mg | ORAL_TABLET | Freq: Four times a day (QID) | ORAL | Status: DC | PRN

## 2021-03-30 MED ORDER — PREDNISONE 20 MG PO TABS
50.0000 mg | ORAL_TABLET | Freq: Every day | ORAL | Status: AC
  Administered 2021-04-03 – 2021-04-05 (×3): 50 mg via ORAL
  Filled 2021-03-30 (×3): qty 2

## 2021-03-30 MED ORDER — LAMIVUDINE 150 MG PO TABS
300.0000 mg | ORAL_TABLET | Freq: Every day | ORAL | Status: DC
  Administered 2021-03-31 – 2021-05-03 (×34): 300 mg via ORAL
  Filled 2021-03-30 (×34): qty 2

## 2021-03-30 NOTE — Progress Notes (Signed)
RT came by to assess pts trach and found trach partially out. RT attempted to insert trach with obturator but was unsuccessful. RT attempted to pass suction catheter and catheter would not pass. MD was contacted and orders to decannulate were given. Pt was decannulated to RA, stoma was cleaned and dressed. No complications noted. RN made aware.
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
## 2021-03-30 NOTE — Progress Notes (Signed)
Inpatient Rehab Admissions Coordinator:    I have insurance approval and a bed available for pt to admit to CIR today. Awaiting on confirmation from Dr. Lonny Prude that he is ready.  Will let pt/family and TOC team know.   Shann Medal, PT, DPT Admissions Coordinator [phone] 03/30/21  11:02 AM

## 2021-03-30 NOTE — Progress Notes (Signed)
Will resume primaquin for 2 more days to complete at the same time with clindamycin to complete a consecutive 21d of therapy. Ok per [employer].  Onnie Boer, PharmD, BCIDP, AAHIVP, CPP Infectious Disease Pharmacist 03/30/2021 4:14 PM

## 2021-03-30 NOTE — Discharge Summary (Addendum)
Physician Discharge Summary  Bruce Hunter [id number] DOB: 1976/05/14 DOA: 03/04/2021  PCP: Elwyn Reach, MD  Admit date: 03/04/2021 Discharge date: 03/30/2021  Admitted From: Home Disposition: [facility]  Recommendations for Outpatient Follow-up:  Follow up with infectious disease as an outpatient Last dose of PCP treatment on 04/02/2021 Follow up modified barium swallow study Please follow up on the following pending results: None   Discharge Condition: Stable CODE STATUS: Full code Diet recommendation: Regular diet   Brief/Interim Summary:  Admission HPI written by Collier Bullock, MD   History of Present Illness: 45 yo man with HIV/AIDS here with acute hypoxemic respiratory failure.   Found to have peripheral edema, elevated lactic acid, hypotension.   SOB and cough productive of tan sputum.  Has had respiratory symptoms since he was discharged earlier this month (6/13).   LE edema, also new.  No chest pain.   Recently diagnosed with HIV/AIDS, started on medications, but patient tells me he "hasn't had time to start taking them" but he isn't really able to explain further.     Hospital course:  Acute respiratory failure with hypoxia Secondary to presumed PCP pneumonia. Patient required intubation on 7/1 and was transitioned to tracheostomy performed on 7/13.  Patient is currently stable on trach collar.  Patient using Passy-Muir valve for speaking and eating per speech therapy recommendations.  Infectious disease on board and is treating patient empirically for PCP with clindamycin, primaquine, prednisone with recommendations for total 21 days of treatment.   Acute metabolic encephalopathy In setting of infection. Resolved.   AKI on CKD stage IIIb Secondary to ATN. Patient presented with a creatinine of 5.88 and started on CRRT. CRRT stopped on 7/15. Creatinine continues to improve and AKI now appears resolved. Nephrology signed off.    Chronic pain Substance abuse disorder Patient was managed with Precedex in the ICU which was weaned off. Clonidine taper started. Oxycodone also started for pain management. Oxycodone is scheduled. Patient has fentanyl IV prn which is now discontinued. Continue oxycodone prn.   Anxiety Patient is on Klonopin as an outpatient. Continue Klonopin   HIV/AIDS Continue Dovato. Resume Dapsone after completion of PCP treatment   Prediabetes Hemoglobin A1c of 5.9% on February 12, 2021.  Rechecked in July with a hemoglobin A1c of 5.4%.  Started on Lantus 5 units and SSI while in the ICU.  Currently on prednisone for PCP treatment. Lantus discontinued on discharge. While at inpatient rehab, could consider restarting Latus 5 units but can likely discontinue once off of steroids.   Anemia of chronic disease Acute anemia Anemia appears to be chronic.  Previous baseline of around 9.  Patient has required a total of 5 units of PRBC.  Transfusion for recurrent anemia. Iron panel consistent with anemia of chronic disease. Iron levels low.  Patient with a history of fecal occult blood positive stool in June 2022; at that time upper endoscopy and colonoscopy were significant for scattered diverticula throughout entire colon, internal hemorrhoids, erythematous mucosa of the fundus, duodenitis and candidiasis. Hemoglobin appears to be trending down again. Completed IV iron load.   Esophageal candidiasis Seen on EGD in June. Recommendation at that time for Diflucan x7 weeks. Course appears to have completed.   Urinary retention Patient had a foley catheter in the ICU which was removed. Patient developed recurrent urinary retention and foley catheter replaced on 7/20. Consider either repeat voiding trial or urology follow-up.   Pressure injury Medial/upper sacrum, POA. Left/right buttocks, unknown if  present on admission.   Ankle pain Gout MRI obtained and significant for substantial erosive arthropathy likely  from patient's history of gout. Patient was previously on colchicine. Significant ankle pain. Hesitant to start NSAIDs secondary to recent kidney injury requiring renal replacement therapy. He is on prednisone however that has not helped. Continue oxycodone and gabapentin.   Shoulder pain No passive ROM pain. ROM was full. Unlikely a septic joint. X-ray unremarkable.   Hypokalemia Supplementation given. Resolved.   Hypomagnesemia Supplementation given.  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)   Elevated LFTs   Septic shock (HCC)   Pressure injury of skin   Acute metabolic encephalopathy   Severe sepsis (HCC)   Altered mental status   Status post tracheostomy ([place])   Acute left ankle pain   Acute pain of left shoulder    Discharge Instructions   Allergies as of 03/30/2021   No Known Allergies      Medication List     STOP taking these medications    dolutegravir 50 MG tablet Commonly known as: TIVICAY   fluconazole 100 MG tablet Commonly known as: DIFLUCAN   gabapentin 600 MG tablet Commonly known as: NEURONTIN Replaced by: gabapentin 300 MG capsule   hydrochlorothiazide 25 MG tablet Commonly known as: HYDRODIURIL   HYDROcodone-acetaminophen 5-325 MG tablet Commonly known as: NORCO/VICODIN   lamivudine 300 MG tablet Commonly known as: EPIVIR   meloxicam 15 MG tablet Commonly known as: MOBIC   miconazole 2 % cream Commonly known as: MICOTIN   Mitigare 0.6 MG Caps Generic drug: Colchicine   ondansetron 4 MG tablet Commonly known as: ZOFRAN   sodium bicarbonate 650 MG tablet   traMADol 50 MG tablet Commonly known as: ULTRAM        TAKE these medications    Accu-Chek Guide test strip Generic drug: glucose blood 2 (two) times daily.   Accu-Chek Softclix Lancets lancets 2 (two) times daily.   amLODipine 10 MG tablet Commonly known as: NORVASC Take 10 mg by mouth daily.   clindamycin 900 MG/50ML IVPB Commonly known as: CLEOCIN Inject 50 mLs (900 mg total) into the vein every 8 (eight) hours for 4 days. Last dose on 04/02/2021   clonazePAM 0.5 MG tablet Commonly known as: KLONOPIN Take 0.5 mg by mouth 3 (three) times daily as needed for anxiety.   cyanocobalamin 1000 MCG tablet Take 1 tablet (1,000 mcg total) by mouth daily.   dapsone 100 MG tablet Take 1 tablet (100 mg total) by mouth daily. Restart on 04/03/2021 Start taking on: April 03, 2021 What changed:  additional instructions These instructions start on April 03, 2021. If you are unsure what to do until then, ask your doctor or other care provider.   DOVATO PO Take 50 mg by mouth daily.   folic acid 1 MG tablet Commonly known as: FOLVITE Take 1 tablet (1 mg total) by mouth daily.   gabapentin 300 MG capsule Commonly known as: NEURONTIN Take 1 capsule (300 mg total) by mouth 3 (three) times daily. Replaces: gabapentin 600 MG tablet   hydrALAZINE 25 MG tablet Commonly known as: APRESOLINE Take 1 tablet (25 mg total) by mouth every 8 (eight) hours.  hydrocortisone 2.5 % cream Apply 1 application topically 2 (two) times daily as needed (itching).   lisinopril 20 MG tablet Commonly known as: ZESTRIL Take 20 mg by mouth daily.   multivitamin with minerals Tabs tablet Take 1 tablet by mouth daily.   omeprazole 20 MG capsule Commonly known as: PRILOSEC Take 1 capsule (20 mg total) by mouth daily.   oxyCODONE 5 MG immediate release tablet Commonly known as: Oxy IR/ROXICODONE Take 1-2 tablets (5-10 mg total) by mouth every 6 (six) hours as needed for severe pain.   polyethylene glycol 17 g packet Commonly known as: MIRALAX /  GLYCOLAX Take 17 g by mouth daily as needed for moderate constipation.   predniSONE 20 MG tablet Commonly known as: DELTASONE Take 1 tablet (20 mg total) by mouth daily with breakfast for 3 days. Last dose on 04/02/2021 Start taking on: March 31, 2021   primaquine 26.3 (15 Base) MG tablet Take 2 tablets (30 mg total) by mouth daily for 3 days. Last dose on 04/02/2021 Start taking on: March 31, 2021   zolpidem 5 MG tablet Commonly known as: AMBIEN Take 5 mg by mouth at bedtime as needed for sleep.        No Known Allergies  Consultations: PCCM Nephrology Infectious disease PM&R  Procedures/Studies: CT ABDOMEN PELVIS WO CONTRAST  Result Date: 03/13/2021 CLINICAL DATA:  Anemia, low hemoglobin, renal failure EXAM: CT ABDOMEN AND PELVIS WITHOUT CONTRAST TECHNIQUE: Multidetector CT imaging of the abdomen and pelvis was performed following the standard protocol without IV contrast. COMPARISON:  02/12/2021 FINDINGS: Lower chest: Extensive heterogeneous and consolidative airspace opacity in the included bilateral lung bases with a small left pleural effusion. Coronary artery calcifications. Hepatobiliary: No solid liver abnormality is seen. Tiny gallstones in the dependent gallbladder. Gallbladder wall thickening, or biliary dilatation. Pancreas: Unremarkable. No pancreatic ductal dilatation or surrounding inflammatory changes. Spleen: Normal in size without significant abnormality. Adrenals/Urinary Tract: Adrenal glands are unremarkable. Kidneys are normal, without renal calculi, solid lesion, or hydronephrosis. Urinary bladder is decompressed by Foley catheter. Stomach/Bowel: Stomach is within normal limits. Appendix is not clearly visualized. No evidence of bowel wall thickening, distention, or inflammatory changes. The colon is fluid-filled to the rectum. Vascular/Lymphatic: Aortic atherosclerosis. No enlarged abdominal or pelvic lymph nodes. Reproductive: No mass or other significant  abnormality. Other: Anasarca. There is intermediate attenuation (HU = 29) fluid in the right lower quadrant, which appears to be extraperitoneal, overlying the right psoas and external iliac artery (series 2, image 75). Musculoskeletal: No acute or significant osseous findings. IMPRESSION: 1. There is intermediate attenuation fluid in the right lower quadrant, which appears to be extraperitoneal, overlying the right psoas and external iliac artery. This is of uncertain etiology and may reflect a small hematoma, particularly if there has been recent vascular access in the right groin. The presence or absence of active bleeding is generally not established by noncontrast CT. 2. Extensive heterogeneous and consolidative airspace opacity in the included bilateral lung bases with a small left pleural effusion, consistent with infection or aspiration. 3. Anasarca. 4. Cholelithiasis. 5. Coronary artery disease. Aortic Atherosclerosis (ICD10-I70.0). Electronically Signed   By: Eddie Candle M.D.   On: 03/13/2021 14:13   DG Abd 1 View  Result Date: 03/17/2021 CLINICAL DATA:  Feeding tube placement. EXAM: ABDOMEN - 1 VIEW COMPARISON:  Radiographs 03/12/2021 and 03/06/2021.  CT 03/13/2021. FINDINGS: 1513 hours. A feeding tube has been placed, projecting to the level of the proximal duodenum. The visualized bowel gas pattern is normal.  Bilateral pulmonary airspace opacities are similar to recent chest radiographs. IMPRESSION: Feeding tube projects to the level of the proximal duodenum. Electronically Signed   By: Richardean Sale M.D.   On: 03/17/2021 15:49   DG Abd 1 View  Result Date: 03/12/2021 CLINICAL DATA:  OG tube placement EXAM: ABDOMEN - 1 VIEW COMPARISON:  03/10/2021 FINDINGS: Enteric tube with the tip projecting over the antrum of the stomach. No bowel dilatation to suggest obstruction. No evidence of pneumoperitoneum, portal venous gas or pneumatosis. No pathologic calcifications along the expected course of  the ureters. No acute osseous abnormality. IMPRESSION: Enteric tube with the tip projecting over the antrum of the stomach. Electronically Signed   By: Kathreen Devoid   On: 03/12/2021 10:17   DG Abd 1 View  Result Date: 03/10/2021 CLINICAL DATA:  OG tube placement. EXAM: ABDOMEN - 1 VIEW COMPARISON:  Radiograph yesterday. FINDINGS: The enteric tube is in the right mid abdomen, tip likely in the third portion of the duodenum. The side-port is not well seen. No small bowel distension. IMPRESSION: Enteric tube with tip in the right mid abdomen, likely in the third portion of the duodenum. Electronically Signed   By: Keith Rake M.D.   On: 03/10/2021 22:33   DG Abd 1 View  Result Date: 03/09/2021 CLINICAL DATA:  NG tube adjustment. EXAM: ABDOMEN - 1 VIEW COMPARISON:  03/05/2021. FINDINGS: NG tube noted with its tip coiled over the distal stomach. No gastric or bowel distention noted. No free air noted. Bilateral pulmonary infiltrates. IMPRESSION: NG tube noted with its tip coiled over the distal stomach. Electronically Signed   By: Marcello Moores  Register   On: 03/09/2021 09:01   DG Abd 1 View  Result Date: 03/06/2021 CLINICAL DATA:  NG tube placement. EXAM: ABDOMEN - 1 VIEW COMPARISON:  CT 02/12/2021 FINDINGS: Tip and side port of the enteric tube below the diaphragm in the stomach. No bowel dilatation in the upper abdomen. IMPRESSION: Tip and side port of the enteric tube below the diaphragm in the stomach. Electronically Signed   By: Keith Rake M.D.   On: 03/06/2021 00:01   CT HEAD WO CONTRAST  Result Date: 03/12/2021 CLINICAL DATA:  Delirium. EXAM: CT HEAD WITHOUT CONTRAST TECHNIQUE: Contiguous axial images were obtained from the base of the skull through the vertex without intravenous contrast. COMPARISON:  None. FINDINGS: Brain: No evidence of acute infarction, hemorrhage, hydrocephalus, extra-axial collection or mass lesion/mass effect. Mild atrophy with ex vacuo ventricular dilation. Vascular: No  hyperdense vessel identified. Mild calcific intracranial atherosclerosis. Skull: No acute fracture. Sinuses/Orbits: Visualized sinuses are clear. Other: Prominent soft tissue/fluid in the posterior nasopharynx, partially imaged. IMPRESSION: 1. No evidence of acute intracranial abnormality. Mild atrophy. 2. Prominent soft tissue/fluid in the posterior nasopharynx, possibly adenoid hypertrophy and layering secretions; however, this area is incompletely imaged. Recommend correlation with direct inspection. 3. Small left mastoid effusion. Electronically Signed   By: Margaretha Sheffield MD   On: 03/12/2021 15:45   US RENAL  Result Date: 03/05/2021 CLINICAL DATA:  Acute renal injury EXAM: RENAL / URINARY TRACT ULTRASOUND COMPLETE COMPARISON:  02/12/2021 FINDINGS: Right Kidney: Renal measurements: 11.6 x 5.6 x 5.1 cm. = volume: 172 mL. Echogenicity within normal limits. No mass or hydronephrosis visualized. Left Kidney: Renal measurements: 14.1 x 7.2 x 7.1 cm. = volume: 373 mL. Echogenicity within normal limits. No mass or hydronephrosis visualized. Bladder: Appears normal for degree of bladder distention. Other: None. IMPRESSION: Normal appearing kidneys bilaterally. Electronically Signed   By:  Inez Catalina M.D.   On: 03/05/2021 11:34   MR ANKLE LEFT WO CONTRAST  Result Date: 03/27/2021 CLINICAL DATA:  Foot pain. Diabetes with diabetic neuropathy. Recent acute respiratory failure. Recent acute kidney injury. Gout. EXAM: MRI OF THE LEFT ANKLE WITHOUT CONTRAST TECHNIQUE: Multiplanar, multisequence MR imaging of the ankle was performed. No intravenous contrast was administered (the patient terminated the exam prior to IV contrast administration). COMPARISON:  05/16/2019 radiographs FINDINGS: TENDONS Peroneal: Common peroneal and both peroneus longus and brevis tenosynovitis. Posteromedial: Unremarkable Anterior: Unremarkable Achilles: Unremarkable Plantar Fascia: Unremarkable LIGAMENTS Lateral: Thickened calcaneofibular  ligament, otherwise unremarkable. Medial: Unremarkable CARTILAGE Ankle Joint: Borderline joint effusion. No marrow edema along the margins of the ankle joint. Subtalar Joints/Sinus Tarsi: Effusion of the posterior subtalar joint posteriorly and laterally. Os trigonum without internal marrow edema. Degenerative or erosive subcortical cystic lesion along the talar side of the middle subtalar facet on image 9 series 6. Bones: Substantial erosive lesions in the midfoot along the articulations between the navicular, cuneiform is, and cuboid, with associated synovitis but with only a mild degree of associated marrow edema, appearance favoring gout or rheumatoid arthropathy. Erosions along the base of the second and fourth metatarsal. I not see highly compelling findings of osteomyelitis. Other: Abnormal edema in the extensor digitorum brevis muscle and also in the quadratus plantae, flexor digitorum brevis, and abductor digiti minimi. There is only minimal edema in the abductor digiti minimi muscle. IMPRESSION: 1. Substantial erosive arthropathy in the midfoot and to a lesser extent along the subtalar joints and Lisfranc joint. Appearance favors rheumatoid or gout or arthropathy. There is a posterior subtalar facet effusion. The relative paucity of marrow edema argues against active infection although there could be some overlap in appearance between infection and erosive arthropathy. I do not see a draining sinus tract, but if the patient does have draining tracks in the foot/ankle region than that might drive further consideration for infectious process. 2. Peroneus tenosynovitis. 3. Abnormal edema in the extensor digitorum brevis, quadratus plantae, flexor digitorum brevis, to a lesser extent in the abductor digiti minimi muscle. Although myositis is a possibility, this could also be neurogenic edema. Electronically Signed   By: Van Clines M.D.   On: 03/27/2021 12:00   DG Chest Port 1 View  Result Date:  03/21/2021 CLINICAL DATA:  45 year old male with history of acute hypoxic respiratory failure. EXAM: PORTABLE CHEST 1 VIEW COMPARISON:  Chest x-ray 03/19/2021. FINDINGS: There is a right-sided internal jugular central venous catheter with tip terminating in the distal superior vena cava. A feeding tube is seen extending into the abdomen, however, the tip of the feeding tube extends below the lower margin of the image. Lung volumes are low. Patchy ill-defined opacities and widespread areas of interstitial prominence are again noted throughout the lungs bilaterally with relative sparing in the right lung base. Overall, aeration appears slightly worsened than examination from 2 days ago. No pleural effusions. No pneumothorax. Pulmonary vasculature is largely obscured. Mild cardiomegaly. Upper mediastinal contours are within normal limits. IMPRESSION: 1. Support apparatus, as above. 2. Slightly worsened aeration in the chest compared to 2 days ago. Overall, findings are favored to reflect ARDS. Electronically Signed   By: Vinnie Langton M.D.   On: 03/21/2021 08:09   DG Chest Port 1 View  Result Date: 03/19/2021 CLINICAL DATA:  Respiratory failure. EXAM: PORTABLE CHEST 1 VIEW COMPARISON:  03/17/2021. FINDINGS: Tracheostomy tube tip projects at the tracheal air column at the level of the clavicular heads, similar  to prior. Enteric tube courses below the diaphragm with the tip outside the field of view. Right IJ central venous catheter is in similar position. Mild improvement in right suprahilar opacity with otherwise similar bilateral interstitial and airspace opacities. No visible pneumothorax or pleural effusions on this single semi erect AP radiograph. IMPRESSION: 1. Mild improvement in the right suprahilar opacities with otherwise similar bilateral interstitial and airspace opacities. 2. Similar position of support devices, as detailed above. Electronically Signed   By: Margaretha Sheffield MD   On: 03/19/2021  07:54   DG Chest Port 1 View  Result Date: 03/17/2021 CLINICAL DATA:  Feeding tube placement EXAM: PORTABLE CHEST 1 VIEW COMPARISON:  03/15/2021 FINDINGS: Enteric tube passes below the diaphragm and out of field of view. Right IJ central line is again noted. Endotracheal tube is no longer present with a tracheostomy device now noted. Persistent right greater than left perihilar and left lower lung opacities. No significant pleural effusion. No pneumothorax. IMPRESSION: Enteric tube passes into the stomach with tip out of field of view. Tracheostomy device is now present. Persistent bilateral pulmonary opacities. Electronically Signed   By: Macy Mis M.D.   On: 03/17/2021 15:50   DG CHEST PORT 1 VIEW  Result Date: 03/15/2021 CLINICAL DATA:  HIV, multifocal pneumonia. EXAM: PORTABLE CHEST 1 VIEW COMPARISON:  03/14/2021. FINDINGS: Endotracheal tube terminates 4.6 cm above the carina. Nasogastric tube is followed into the stomach with the tip projecting beyond the inferior margin of the image. Right IJ central line is in the SVC. Heart is at the upper limits of normal in size to mildly enlarged, stable. Patchy mixed interstitial and airspace opacification bilaterally, worst in the right upper lobe. Left lower lobe aeration has improved somewhat in the interval. No definite pleural fluid. IMPRESSION: Multi lobar pneumonia with improving aeration in the left lower lobe. Electronically Signed   By: Lorin Picket M.D.   On: 03/15/2021 08:05   DG CHEST PORT 1 VIEW  Result Date: 03/14/2021 CLINICAL DATA:  Acute hypoxic respiratory failure with sepsis. History of HIV/aids, cystic kidney disease, asthma, diabetes. EXAM: PORTABLE CHEST 1 VIEW COMPARISON:  Chest x-rays dated 03/13/2021 and 03/11/2021. FINDINGS: Tubes and lines appear stable in position. Bilateral airspace opacities are not significantly changed, again most dense/confluent in the RIGHT upper lobe. Suspect small LEFT pleural effusion. No  pneumothorax is seen. Heart size and mediastinal contours appear stable. IMPRESSION: No significant interval change. Bilateral airspace opacities, most dense/confluent in the RIGHT upper lobe, compatible with multifocal pneumonia. Probable small LEFT pleural effusion. Electronically Signed   By: Franki Cabot M.D.   On: 03/14/2021 08:32   DG CHEST PORT 1 VIEW  Result Date: 03/13/2021 CLINICAL DATA:  Respiratory failure.  No pneumonia EXAM: PORTABLE CHEST 1 VIEW COMPARISON:  03/11/2021 FINDINGS: Endotracheal tube terminates 4.0 cm above carina. Nasogastric tube extends beyond the inferior aspect of the film. Right internal jugular line tip at mid to low SVC. Normal heart size. No pleural effusion or pneumothorax. Multifocal airspace disease, greater right than left. Relatively similar. IMPRESSION: No significant change in multifocal bilateral pneumonia. Electronically Signed   By: Abigail Miyamoto M.D.   On: 03/13/2021 11:35   DG Chest Port 1 View  Result Date: 03/11/2021 CLINICAL DATA:  HIV/aids, acute hypoxic respiratory failure, shortness of breath, productive cough with tan sputum EXAM: PORTABLE CHEST 1 VIEW COMPARISON:  Portable exam 1130 hours compared to 03/10/2021 FINDINGS: Tip of endotracheal tube projects 3.0 cm above carina. Nasogastric tube extends into stomach.  RIGHT jugular central venous catheter with tip projecting over SVC. Normal heart size mediastinal contours. BILATERAL pulmonary infiltrates, greatest in RIGHT upper lobe, consistent with multifocal pneumonia. No pleural effusion or pneumothorax. IMPRESSION: BILATERAL pulmonary infiltrates consistent with multifocal pneumonia, greatest in RIGHT upper lobe. Little interval change since prior exam. Electronically Signed   By: Lavonia Dana M.D.   On: 03/11/2021 11:59   DG Chest Port 1 View  Result Date: 03/10/2021 CLINICAL DATA:  Endotracheal tube position.  Respiratory failure. EXAM: PORTABLE CHEST 1 VIEW COMPARISON:  Earlier today at 9:22 p.m.  FINDINGS: Endotracheal tube tip 3.7 cm from the carina. Enteric tube in place with tip below the diaphragm. Right dialysis catheter in place. Stable bilateral airspace disease from earlier today. No pneumothorax or other interval change. IMPRESSION: 1. Endotracheal tube tip 3.7 cm from the carina. Enteric tube in place. 2. Stable bilateral airspace disease from earlier today. Electronically Signed   By: Keith Rake M.D.   On: 03/10/2021 22:32   DG Chest Port 1 View  Result Date: 03/10/2021 CLINICAL DATA:  Intubated EXAM: PORTABLE CHEST 1 VIEW COMPARISON:  03/09/2021, 03/07/2021, 03/06/2021, 03/05/2021 FINDINGS: Endotracheal tube tip is about 4 cm superior to carina. Right-sided central venous catheter tip over the SVC. Esophageal tube tip below the diaphragm but incompletely visualized. No significant interval change in extensive bilateral ground-glass opacities and mild consolidations. Stable cardiomediastinal silhouette. No pneumothorax. IMPRESSION: 1. Endotracheal tube tip about 4 cm superior to carina. 2. No significant interval change in fairly extensive bilateral airspace disease since radiograph 1 day prior Electronically Signed   By: Donavan Foil M.D.   On: 03/10/2021 21:34   DG Chest Port 1 View  Result Date: 03/09/2021 CLINICAL DATA:  Respiratory failure. EXAM: PORTABLE CHEST 1 VIEW COMPARISON:  03/07/2021. FINDINGS: Endotracheal tube, NG tube, right IJ line in stable position. Heart size stable. Multifocal bilateral pulmonary infiltrates/edema again noted without interim change. Low lung volumes. No pleural effusion or pneumothorax. IMPRESSION: 1.  Lines and tubes in stable position. 2. Multifocal bilateral pulmonary infiltrates/edema again noted without interim change. Electronically Signed   By: Marcello Moores  Register   On: 03/09/2021 05:45   DG CHEST PORT 1 VIEW  Result Date: 03/07/2021 CLINICAL DATA:  Acute hypoxemic respiratory failure. EXAM: PORTABLE CHEST 1 VIEW COMPARISON:  March 06, 2021.  FINDINGS: Stable cardiomediastinal silhouette. Endotracheal and nasogastric tubes are unchanged in position. Right internal jugular catheter is unchanged. No pneumothorax or pleural effusion is noted. Stable bilateral upper lobe opacities are noted concerning for pneumonia. Bony thorax is unremarkable. IMPRESSION: Stable support apparatus. Stable bilateral upper lobe opacities are noted concerning for pneumonia. Electronically Signed   By: Marijo Conception M.D.   On: 03/07/2021 08:27   DG CHEST PORT 1 VIEW  Result Date: 03/06/2021 CLINICAL DATA:  Central line placement. EXAM: PORTABLE CHEST 1 VIEW COMPARISON:  03/05/2021 FINDINGS: RIGHT IJ central line has been placed, tip overlying the level of the superior vena cava. Endotracheal tube is in place, tip 4.6 centimeters above the carina. Nasogastric tube is in place, tip beyond the image and beyond the level of the proximal stomach. Heart is mildly enlarged. Dense airspace filling opacities are identified throughout the UPPER lobes bilaterally, showing little change compared to prior studies. The lung bases are relatively spared. No pneumothorax following line placement. IMPRESSION: RIGHT IJ central line tip to the LOWER superior vena cava. No pneumothorax. Electronically Signed   By: Nolon Nations M.D.   On: 03/06/2021 10:52   DG  CHEST PORT 1 VIEW  Result Date: 03/06/2021 CLINICAL DATA:  Intubation. EXAM: PORTABLE CHEST 1 VIEW COMPARISON:  Radiograph earlier today. FINDINGS: Endotracheal tube tip 4.6 cm from the carina. Enteric tube is in place, better assessed on subsequent abdominal radiograph. Stable cardiomegaly. Bilateral perihilar airspace disease with central air bronchograms, left greater than right, not significantly changed from earlier today. No convincing pleural effusion. No pneumothorax. IMPRESSION: 1. Endotracheal tube tip 4.6 cm from the carina. Enteric tube in place, better assessed on subsequent abdominal radiograph. 2. Stable cardiomegaly  and bilateral perihilar airspace disease/edema. Electronically Signed   By: Keith Rake M.D.   On: 03/06/2021 00:01   DG CHEST PORT 1 VIEW  Result Date: 03/05/2021 CLINICAL DATA:  Shortness of breath. EXAM: PORTABLE CHEST 1 VIEW COMPARISON:  March 04, 2021 FINDINGS: Predominant stable moderate to marked severity infiltrates are again seen, bilaterally. This is increased in severity when compared to the prior study. There is no evidence of a pleural effusion or pneumothorax. The cardiac silhouette is borderline in size. The visualized skeletal structures are unremarkable. IMPRESSION: Stable moderate to marked severity bilateral infiltrates. Electronically Signed   By: Virgina Norfolk M.D.   On: 03/05/2021 22:29   DG Chest Port 1 View  Result Date: 03/04/2021 CLINICAL DATA:  Shortness of breath EXAM: PORTABLE CHEST 1 VIEW COMPARISON:  02/13/2021 FINDINGS: Diffuse airspace disease throughout the left lung and also in the right upper lobe/perihilar region. Heart is mildly enlarged. No effusions or pneumothorax. No acute bony abnormality. IMPRESSION: Diffuse left lung airspace disease and airspace disease in the right upper lobe. Findings most compatible with multifocal pneumonia. This could conceivably represent asymmetric edema. Electronically Signed   By: Rolm Baptise M.D.   On: 03/04/2021 19:09   DG Shoulder Left Port  Result Date: 03/28/2021 CLINICAL DATA:  Left shoulder pain.  No reported injury. EXAM: LEFT SHOULDER COMPARISON:  None. FINDINGS: There is no evidence of fracture or dislocation. There is no evidence of arthropathy or other focal bone abnormality. Soft tissues are unremarkable. IMPRESSION: Negative. Electronically Signed   By: Lucienne Capers M.D.   On: 03/28/2021 18:59   DG Swallowing Func-Speech Pathology  Result Date: 03/30/2021 Formatting of this result is different from the original. Objective Swallowing Evaluation: Type of Study: MBS-Modified Barium Swallow Study  Patient  Details Name: Bruce Hunter MRN: [id number] Date of Birth: November 24, 1975 Today's Date: 03/30/2021 Time: SLP Start Time (ACUTE ONLY): 33 -SLP Stop Time (ACUTE ONLY): 1232 SLP Time Calculation (min) (ACUTE ONLY): 12 min Past Medical History: Past Medical History: Diagnosis Date  Asthma   Diabetes mellitus without complication ([place])   Gout   Hyperlipidemia   Hypertension   Obesity  Past Surgical History: Past Surgical History: Procedure Laterality Date  BIOPSY  02/15/2021  Procedure: BIOPSY;  Surgeon: Ronnette Juniper, MD;  Location: [facility];  Service: Gastroenterology;;  COLONOSCOPY WITH PROPOFOL N/A 02/15/2021  Procedure: COLONOSCOPY WITH PROPOFOL;  Surgeon: Ronnette Juniper, MD;  Location: [facility];  Service: Gastroenterology;  Laterality: N/A;  ESOPHAGOGASTRODUODENOSCOPY (EGD) WITH PROPOFOL N/A 02/15/2021  Procedure: ESOPHAGOGASTRODUODENOSCOPY (EGD) WITH PROPOFOL;  Surgeon: Ronnette Juniper, MD;  Location: [facility];  Service: Gastroenterology;  Laterality: N/A;  GANGLION CYST EXCISION Right 02/26/2020  Procedure: EXCISION TOPHUS RIGHT FOOT;  Surgeon: Evelina Bucy, DPM;  Location: [facility];  Service: Podiatry;  Laterality: Right;  NO PAST SURGERIES   HPI: 46 y.o. male with a past medical history significant for HIV/AIDS, CKD, asthma, obesity, DM2 admitted for acute hypoxic  respiratory failure with sepsis, requiring ventilator support on 7/1 and tracheostomy 7/13, s/p decannulation 7/26 am.  Subjective: alert, cooperative upright in chair for procedure Assessment / Plan / Recommendation CHL IP CLINICAL IMPRESSIONS 03/30/2021 Clinical Impression Repeat MBSS revealed excellent improvements in swallow function likely since trach decannulation this am. Oropharyngeal swallow now grossly within functional limits. No oral deficits appreciated. Pt with trace transient laryngeal penetration with thin liquids on occasion (PAS-2) but penetrates spontaneously cleared post swallow. No pharyngeal residuals exhibited  with any PO. No aspiration evidenced with any PO. Recommend diet advancement to regular and thin liquids with meds as tolerated. SLP to follow up x1 for diet tolerance. Pt and RN notified of diet recommendations. SLP Visit Diagnosis Dysphagia, unspecified (R13.10) Attention and concentration deficit following -- Frontal lobe and executive function deficit following -- Impact on safety and function Mild aspiration risk   CHL IP TREATMENT RECOMMENDATION 03/30/2021 Treatment Recommendations Therapy as outlined in treatment plan below   Prognosis 03/30/2021 Prognosis for Safe Diet Advancement Good Barriers to Reach Goals -- Barriers/Prognosis Comment -- CHL IP DIET RECOMMENDATION 03/30/2021 SLP Diet Recommendations Regular solids;Thin liquid Liquid Administration via Cup;Straw Medication Administration Whole meds with liquid Compensations Slow rate;Small sips/bites Postural Changes Seated upright at 90 degrees   CHL IP OTHER RECOMMENDATIONS 03/30/2021 Recommended Consults -- Oral Care Recommendations Oral care BID Other Recommendations --   CHL IP FOLLOW UP RECOMMENDATIONS 03/30/2021 Follow up Recommendations None   CHL IP FREQUENCY AND DURATION 03/30/2021 Speech Therapy Frequency (ACUTE ONLY) min 1 x/week Treatment Duration 1 week      CHL IP ORAL PHASE 03/30/2021 Oral Phase WFL Oral - Pudding Teaspoon -- Oral - Pudding Cup -- Oral - Honey Teaspoon -- Oral - Honey Cup -- Oral - Nectar Teaspoon -- Oral - Nectar Cup -- Oral - Nectar Straw -- Oral - Thin Teaspoon -- Oral - Thin Cup -- Oral - Thin Straw -- Oral - Puree -- Oral - Mech Soft -- Oral - Regular -- Oral - Multi-Consistency -- Oral - Pill -- Oral Phase - Comment --  CHL IP PHARYNGEAL PHASE 03/30/2021 Pharyngeal Phase Impaired Pharyngeal- Pudding Teaspoon -- Pharyngeal -- Pharyngeal- Pudding Cup -- Pharyngeal -- Pharyngeal- Honey Teaspoon -- Pharyngeal -- Pharyngeal- Honey Cup -- Pharyngeal -- Pharyngeal- Nectar Teaspoon -- Pharyngeal -- Pharyngeal- Nectar Cup Delayed  swallow initiation-pyriform sinuses Pharyngeal -- Pharyngeal- Nectar Straw -- Pharyngeal -- Pharyngeal- Thin Teaspoon -- Pharyngeal -- Pharyngeal- Thin Cup Delayed swallow initiation-pyriform sinuses;Penetration/Aspiration before swallow;Penetration/Aspiration during swallow Pharyngeal Material does not enter airway;Material enters airway, remains ABOVE vocal cords then ejected out Pharyngeal- Thin Straw Penetration/Aspiration before swallow;Penetration/Aspiration during swallow;Delayed swallow initiation-pyriform sinuses Pharyngeal Material does not enter airway;Material enters airway, remains ABOVE vocal cords then ejected out Pharyngeal- Puree WFL Pharyngeal -- Pharyngeal- Mechanical Soft Delayed swallow initiation-vallecula Pharyngeal -- Pharyngeal- Regular -- Pharyngeal -- Pharyngeal- Multi-consistency -- Pharyngeal -- Pharyngeal- Pill Delayed swallow initiation-pyriform sinuses;Penetration/Aspiration before swallow;Penetration/Aspiration during swallow Pharyngeal Material enters airway, remains ABOVE vocal cords then ejected out;Material does not enter airway Pharyngeal Comment --  CHL IP CERVICAL ESOPHAGEAL PHASE 03/30/2021 Cervical Esophageal Phase WFL Pudding Teaspoon -- Pudding Cup -- Honey Teaspoon -- Honey Cup -- Nectar Teaspoon -- Nectar Cup -- Nectar Straw -- Thin Teaspoon -- Thin Cup -- Thin Straw -- Puree -- Mechanical Soft -- Regular -- Multi-consistency -- Pill -- Cervical Esophageal Comment -- Chelsea E Hartness MA, CCC-SLP 03/30/2021, 1:01 PM              DG Swallowing Func-Speech Pathology  Result Date: 03/22/2021 Formatting of this result is different from the original. Objective Swallowing Evaluation: Type of Study: MBS-Modified Barium Swallow Study  Patient Details Name: Bruce Hunter MRN: [id number] Date of Birth: 1976/01/16 Today's Date: 03/22/2021 Time: SLP Start Time (ACUTE ONLY): 1242 -SLP Stop Time (ACUTE ONLY): [id number] SLP Time Calculation (min) (ACUTE ONLY): 10 min Past Medical History:  Past Medical History: Diagnosis Date  Asthma   Diabetes mellitus without complication ([place])   Gout   Hyperlipidemia   Hypertension   Obesity  Past Surgical History: Past Surgical History: Procedure Laterality Date  BIOPSY  02/15/2021  Procedure: BIOPSY;  Surgeon: Ronnette Juniper, MD;  Location: [facility];  Service: Gastroenterology;;  COLONOSCOPY WITH PROPOFOL N/A 02/15/2021  Procedure: COLONOSCOPY WITH PROPOFOL;  Surgeon: Ronnette Juniper, MD;  Location: [facility];  Service: Gastroenterology;  Laterality: N/A;  ESOPHAGOGASTRODUODENOSCOPY (EGD) WITH PROPOFOL N/A 02/15/2021  Procedure: ESOPHAGOGASTRODUODENOSCOPY (EGD) WITH PROPOFOL;  Surgeon: Ronnette Juniper, MD;  Location: [facility];  Service: Gastroenterology;  Laterality: N/A;  GANGLION CYST EXCISION Right 02/26/2020  Procedure: EXCISION TOPHUS RIGHT FOOT;  Surgeon: Evelina Bucy, DPM;  Location: [facility];  Service: Podiatry;  Laterality: Right;  NO PAST SURGERIES   HPI: 45 y.o. male with a past medical history significant for HIV/AIDS, CKD, asthma, obesity, DM2 admitted for acute hypoxic respiratory failure with sepsis, requiring ventilator support on 7/1 and tracheostomy 7/13, CRRT.  Subjective: alert, upright in chair for procedure with PMSV in place Assessment / Plan / Recommendation CHL IP CLINICAL IMPRESSIONS 03/22/2021 Clinical Impression Pt presents with a mild oral and mild to moderate pharyngeal dysphagia, likely of multifactorial etiology. PMSV in place during entire duration of procedure; pt tolerating well. Pt with mild oral residuals with all POs, eventually spilling to the valleculae post intitial swallow (cues for second volitional swallows cleared). Piecemeal swallowing noted with solid PO, pt with good mastication and baseline adequate dentition. Pharyngeally, pt with incomplete epiglottic deflection, reduced laryngeal vestibule closure, and diminished sensation. This allowed for laryngeal penetration of thin liquids (PAS-2,3 via  cup), (PAS 3, 8 thins via straw) post swallow silent tracheal aspiration of thins as penetrates progressed down trachea, (PAS-2/3 nectar thick liquids via cup), and (PAS-2 puree during the swallow). Airway protection was intact with soft solid and honey thick liquids. Recommend conservative diet of honey thick liquids and dysphagia 3 (mechanical soft) consistencies with meds in puree (crush larger). Pt must utilize PMSV during all PO consumption. SLP to closely monitor for tolerance. Anticipate with extended time and PMSV usage, pt will be able to tolerate upgraded POs. SLP Visit Diagnosis Dysphagia, oropharyngeal phase (R13.12) Attention and concentration deficit following -- Frontal lobe and executive function deficit following -- Impact on safety and function Mild aspiration risk;Moderate aspiration risk   CHL IP TREATMENT RECOMMENDATION 03/22/2021 Treatment Recommendations Therapy as outlined in treatment plan below   Prognosis 03/22/2021 Prognosis for Safe Diet Advancement Good Barriers to Reach Goals Time post onset Barriers/Prognosis Comment -- CHL IP DIET RECOMMENDATION 03/22/2021 SLP Diet Recommendations Honey thick liquids;Dysphagia 3 (Mech soft) solids Liquid Administration via Cup Medication Administration Whole meds with puree Compensations Slow rate;Small sips/bites Postural Changes Seated upright at 90 degrees;Remain semi-upright after after feeds/meals (Comment)   CHL IP OTHER RECOMMENDATIONS 03/22/2021 Recommended Consults -- Oral Care Recommendations Oral care BID Other Recommendations Order thickener from pharmacy;Place PMSV during PO intake   CHL IP FOLLOW UP RECOMMENDATIONS 03/22/2021 Follow up Recommendations Other (comment)   CHL IP FREQUENCY AND DURATION 03/22/2021 Speech Therapy Frequency (  ACUTE ONLY) min 2x/week Treatment Duration 2 weeks      CHL IP ORAL PHASE 03/22/2021 Oral Phase Impaired Oral - Pudding Teaspoon -- Oral - Pudding Cup -- Oral - Honey Teaspoon -- Oral - Honey Cup Delayed oral  transit;Lingual/palatal residue Oral - Nectar Teaspoon -- Oral - Nectar Cup Lingual/palatal residue;Delayed oral transit Oral - Nectar Straw -- Oral - Thin Teaspoon -- Oral - Thin Cup Lingual/palatal residue;Delayed oral transit Oral - Thin Straw Lingual/palatal residue;Delayed oral transit Oral - Puree Delayed oral transit;Lingual/palatal residue Oral - Mech Soft Lingual/palatal residue;Decreased bolus cohesion;Piecemeal swallowing Oral - Regular -- Oral - Multi-Consistency -- Oral - Pill -- Oral Phase - Comment --  CHL IP PHARYNGEAL PHASE 03/22/2021 Pharyngeal Phase Impaired Pharyngeal- Pudding Teaspoon -- Pharyngeal -- Pharyngeal- Pudding Cup -- Pharyngeal -- Pharyngeal- Honey Teaspoon -- Pharyngeal -- Pharyngeal- Honey Cup Delayed swallow initiation-vallecula;Pharyngeal residue - valleculae Pharyngeal -- Pharyngeal- Nectar Teaspoon -- Pharyngeal -- Pharyngeal- Nectar Cup Delayed swallow initiation-pyriform sinuses;Reduced epiglottic inversion;Penetration/Aspiration during swallow;Pharyngeal residue - valleculae Pharyngeal Material enters airway, remains ABOVE vocal cords then ejected out;Material enters airway, remains ABOVE vocal cords and not ejected out Pharyngeal- Nectar Straw -- Pharyngeal -- Pharyngeal- Thin Teaspoon -- Pharyngeal -- Pharyngeal- Thin Cup Delayed swallow initiation-pyriform sinuses;Reduced epiglottic inversion;Penetration/Aspiration during swallow;Penetration/Apiration after swallow;Pharyngeal residue - valleculae Pharyngeal Material enters airway, remains ABOVE vocal cords then ejected out;Material enters airway, remains ABOVE vocal cords and not ejected out Pharyngeal- Thin Straw Delayed swallow initiation-pyriform sinuses;Reduced epiglottic inversion;Reduced airway/laryngeal closure;Penetration/Apiration after swallow;Penetration/Aspiration during swallow;Pharyngeal residue - valleculae Pharyngeal Material enters airway, remains ABOVE vocal cords and not ejected out;Material enters  airway, passes BELOW cords without attempt by patient to eject out (silent aspiration) Pharyngeal- Puree Delayed swallow initiation-vallecula;Penetration/Aspiration during swallow;Reduced epiglottic inversion;Pharyngeal residue - valleculae Pharyngeal Material enters airway, remains ABOVE vocal cords then ejected out;Material enters airway, remains ABOVE vocal cords and not ejected out Pharyngeal- Mechanical Soft Delayed swallow initiation-vallecula;Pharyngeal residue - valleculae Pharyngeal -- Pharyngeal- Regular -- Pharyngeal -- Pharyngeal- Multi-consistency -- Pharyngeal -- Pharyngeal- Pill -- Pharyngeal -- Pharyngeal Comment --  CHL IP CERVICAL ESOPHAGEAL PHASE 03/22/2021 Cervical Esophageal Phase WFL Pudding Teaspoon -- Pudding Cup -- Honey Teaspoon -- Honey Cup -- Nectar Teaspoon -- Nectar Cup -- Nectar Straw -- Thin Teaspoon -- Thin Cup -- Thin Straw -- Puree -- Mechanical Soft -- Regular -- Multi-consistency -- Pill -- Cervical Esophageal Comment -- Chelsea E Hartness MA, CCC-SLP 03/22/2021, 1:26 PM              ECHOCARDIOGRAM COMPLETE  Result Date: 03/05/2021    ECHOCARDIOGRAM REPORT   Patient Name:   Bruce Hunter Date of Exam: 03/05/2021 Medical Rec #:  [id number]      Height:       70.0 in Accession #:    [id number]     Weight:       200.0 lb Date of Birth:  10-Apr-1976      BSA:          2.087 m Patient Age:    79 years       BP:           98/61 mmHg Patient Gender: M              HR:           91 bpm. Exam Location:  Inpatient Procedure: 2D Echo, Cardiac Doppler and Color Doppler Indications:    I50.33 Acute on chronic diastolic (congestive) heart failure  History:  Patient has no prior history of Echocardiogram examinations.                 Risk Factors:Hypertension, Diabetes and Dyslipidemia.  Sonographer:    Tiffany Dance Referring Phys: [id number] [place]  1. Left ventricular ejection fraction, by estimation, is 60 to 65%. The left ventricle has normal function. The left  ventricle has no regional wall motion abnormalities. Left ventricular diastolic parameters were normal.  2. Right ventricular systolic function is low normal. The right ventricular size is normal. There is mildly elevated pulmonary artery systolic pressure.  3. Right atrial size was mildly dilated.  4. The mitral valve is normal in structure. No evidence of mitral valve regurgitation.  5. Tricuspid valve regurgitation is mild to moderate.  6. The aortic valve is normal in structure. Aortic valve regurgitation is not visualized. FINDINGS  Left Ventricle: Left ventricular ejection fraction, by estimation, is 60 to 65%. The left ventricle has normal function. The left ventricle has no regional wall motion abnormalities. The left ventricular internal cavity size was normal in size. There is  no left ventricular hypertrophy. Left ventricular diastolic parameters were normal. Right Ventricle: The right ventricular size is normal. Right vetricular wall thickness was not well visualized. Right ventricular systolic function is low normal. There is mildly elevated pulmonary artery systolic pressure. The tricuspid regurgitant velocity is 2.85 m/s, and with an assumed right atrial pressure of 8 mmHg, the estimated right ventricular systolic pressure is [id number] mmHg. Left Atrium: Left atrial size was normal in size. Right Atrium: Right atrial size was mildly dilated. Pericardium: There is no evidence of pericardial effusion. Mitral Valve: The mitral valve is normal in structure. No evidence of mitral valve regurgitation. Tricuspid Valve: The tricuspid valve is normal in structure. Tricuspid valve regurgitation is mild to moderate. Aortic Valve: The aortic valve is normal in structure. Aortic valve regurgitation is not visualized. Pulmonic Valve: The pulmonic valve was not well visualized. Pulmonic valve regurgitation is not visualized. Aorta: The aortic root and ascending aorta are structurally normal, with no evidence of  dilitation. IAS/Shunts: The atrial septum is grossly normal.  LEFT VENTRICLE PLAX 2D LVIDd:         4.40 cm  Diastology LVIDs:         2.80 cm  LV e' medial:    8.27 cm/s LV PW:         1.60 cm  LV E/e' medial:  6.5 LV IVS:        1.00 cm  LV e' lateral:   10.10 cm/s LVOT diam:     2.00 cm  LV E/e' lateral: 5.3 LV SV:         36 LV SV Index:   17 LVOT Area:     3.14 cm  RIGHT VENTRICLE             IVC RV Basal diam:  4.10 cm     IVC diam: 2.40 cm RV Mid diam:    3.40 cm RV S prime:     13.10 cm/s TAPSE (M-mode): 1.7 cm LEFT ATRIUM           Index       RIGHT ATRIUM           Index LA diam:      3.60 cm 1.72 cm/m  RA Area:     20.20 cm LA Vol (A2C): 61.8 ml 29.61 ml/m RA Volume:   64.80 ml  31.04 ml/m LA Vol (A4C): 39.1  ml 18.73 ml/m  AORTIC VALVE LVOT Vmax:   70.10 cm/s LVOT Vmean:  45.000 cm/s LVOT VTI:    0.115 m  AORTA Ao Root diam: 3.60 cm Ao Asc diam:  3.20 cm MITRAL VALVE               TRICUSPID VALVE MV Area (PHT): 3.91 cm    TR Peak grad:   32.5 mmHg MV Decel Time: 194 msec    TR Vmax:        285.00 cm/s MV E velocity: 53.90 cm/s MV A velocity: 69.20 cm/s  SHUNTS MV E/A ratio:  0.78        Systemic VTI:  0.12 m                            Systemic Diam: 2.00 cm Mertie Moores MD Electronically signed by Mertie Moores MD Signature Date/Time: 03/05/2021/3:09:52 PM    Final    VAS [place] UPPER EXTREMITY VENOUS DUPLEX  Result Date: 03/09/2021 UPPER VENOUS STUDY  Patient Name:  Bruce Hunter  Date of Exam:   03/09/2021 Medical Rec #: [id number]       Accession #:    [id number] Date of Birth: 11/29/1975       Patient Gender: M Patient Age:   42Y Exam Location:  [facility] Procedure:      VAS [place] UPPER EXTREMITY VENOUS DUPLEX Referring Phys: [place]:[id number] MICHAEL B WERT --------------------------------------------------------------------------------  Indications: Swelling Other Indications: Previous IV placement to LUE. Limitations: Patient movement (AMS). Comparison Study: No previous exams Performing  Technologist: Jody Hill RVT, RDMS  Examination Guidelines: A complete evaluation includes B-mode imaging, spectral Doppler, color Doppler, and power Doppler as needed of all accessible portions of each vessel. Bilateral testing is considered an integral part of a complete examination. Limited examinations for reoccurring indications may be performed as noted.  Right Findings: Patient coughing and being suctioned from right side - could not image RUE subclavian  Left Findings: +----------+------------+---------+-----------+----------+-------------------+ LEFT      CompressiblePhasicitySpontaneousProperties      Summary       +----------+------------+---------+-----------+----------+-------------------+ IJV           Full       Yes       Yes                                  +----------+------------+---------+-----------+----------+-------------------+ Subclavian    Full       Yes       Yes                                  +----------+------------+---------+-----------+----------+-------------------+ Axillary      Full       Yes       Yes                                  +----------+------------+---------+-----------+----------+-------------------+ Brachial      Full       Yes       Yes                                  +----------+------------+---------+-----------+----------+-------------------+ Radial        Full                                                      +----------+------------+---------+-----------+----------+-------------------+  Ulnar         Full                                  Not well visualized +----------+------------+---------+-----------+----------+-------------------+ Cephalic      None       No        No                      Acute        +----------+------------+---------+-----------+----------+-------------------+ Basilic       Full       Yes       Yes                                   +----------+------------+---------+-----------+----------+-------------------+  Summary:  Left: No evidence of deep vein thrombosis in the upper extremity. Findings consistent with acute superficial vein thrombosis involving the left cephalic vein.  *See table(s) above for measurements and observations.  Diagnosing physician: Ruta Hinds MD Electronically signed by Ruta Hinds MD on 03/09/2021 at 3:48:56 PM.    Final    DG FLUORO GUIDE LUMBAR PUNCTURE  Result Date: 03/13/2021 CLINICAL DATA:  Fever. Leukocytosis. Active HIV infection. Clinical concern for meningitis. Unconscious. EXAM: DIAGNOSTIC LUMBAR PUNCTURE UNDER FLUOROSCOPIC GUIDANCE COMPARISON:  Head CT dated 03/12/2021 FLUOROSCOPY TIME:  Fluoroscopy Time:  0 minutes 42 seconds Radiation Exposure Index (if provided by the fluoroscopic device): 11.9 mGy Number of Acquired Spot Images: 0 PROCEDURE: Informed consent was obtained from the patient's mother prior to the procedure, including potential complications of bleeding, infection and nerve injury. With the patient prone, the lower back was prepped with Betadine. 1% Lidocaine was used for local anesthesia. Lumbar puncture was performed at the L3-4 level using an 18 gauge needle with return of clear, colorless CSF with an opening pressure of 38 cm water. 14 ml of CSF were obtained for laboratory studies. A closing pressure of 33 cm water was obtained. The patient tolerated the procedure well and there were no apparent complications. IMPRESSION: Successful fluoroscopic guided lumbar puncture, as described above. Electronically Signed   By: Claudie Revering M.D.   On: 03/13/2021 17:15      Subjective: Some joint pain, otherwise no concerns.  Discharge Exam: Vitals:   03/30/21 1050 03/30/21 1056  BP: (!) 162/96   Pulse: 87 (!) 102  Resp: 19 16  Temp: 98.1 F (36.7 C)   SpO2: 98% 97%   Vitals:   03/30/21 0822 03/30/21 0841 03/30/21 1050 03/30/21 1056  BP:   (!) 162/96   Pulse: (!) 106 (!) 101  87 (!) 102  Resp: 20 (!) '26 19 16  '$ Temp:   98.1 F (36.7 C)   TempSrc:   Oral   SpO2: 100% 100% 98% 97%  Weight:      Height:        General: Pt is alert, awake, not in acute distress Cardiovascular: RRR, S1/S2 +, no rubs, no gallops Respiratory: some rhonchi, no wheezing. Lurline Idol is out. Abdominal: Soft, NT, ND, bowel sounds + Extremities: no edema, no cyanosis    The results of significant diagnostics from this hospitalization (including imaging, microbiology, ancillary and laboratory) are listed below for reference.     Labs: BNP (last 3 results) Recent Labs    03/04/21 1814  BNP [id number]*   Basic Metabolic Panel: Recent  Labs  Lab 03/24/21 0955 03/25/21 0148 03/26/21 0247 03/27/21 0149 03/28/21 0025 03/28/21 0754 03/29/21 0921  NA 138 138 139 137 137  --  134*  K 3.9 3.8 3.6 3.2* 3.4*  --  3.6  CL 108 108 107 104 104  --  98  CO2 22 20* '23 23 24  '$ --  28  GLUCOSE 167* 103* 85 93 113*  --  126*  BUN 71* 55* 34* 23* 17  --  13  CREATININE 1.71* 1.54* 1.40* 1.21 1.20  --  1.24  CALCIUM 8.9 9.0 8.9 8.7* 8.6*  --  9.1  MG  --   --   --   --   --  1.0* 1.3*  PHOS 4.7* 5.0* 4.0 3.9 3.4  --   --    Liver Function Tests: Recent Labs  Lab 03/24/21 0955 03/25/21 0148 03/26/21 0247 03/27/21 0149 03/28/21 0025  ALBUMIN 2.0* 2.1* 2.1* 2.1* 2.2*   CBC: Recent Labs  Lab 03/25/21 0148 03/29/21 0921  WBC 10.0 12.1*  HGB 8.5* 9.6*  HCT 27.5* 31.2*  MCV 93.9 94.0  PLT 272 292   CBG: Recent Labs  Lab 03/29/21 1213 03/29/21 1526 03/29/21 2100 03/30/21 0804 03/30/21 1248  GLUCAP 165* 160* 112* 99 159*   Urinalysis    Component Value Date/Time   COLORURINE YELLOW 03/04/2021 2228   APPEARANCEUR CLEAR 03/04/2021 2228   LABSPEC 1.014 03/04/2021 2228   PHURINE 5.0 03/04/2021 2228   GLUCOSEU NEGATIVE 03/04/2021 2228   HGBUR NEGATIVE 03/04/2021 2228   BILIRUBINUR NEGATIVE 03/04/2021 2228   KETONESUR NEGATIVE 03/04/2021 2228   PROTEINUR NEGATIVE 03/04/2021  2228   NITRITE NEGATIVE 03/04/2021 2228   LEUKOCYTESUR NEGATIVE 03/04/2021 2228   Time coordinating discharge: 35 minutes  SIGNED:   Cordelia Poche, MD [facility] 03/30/2021, 1:37 PM
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
## 2021-03-30 NOTE — Progress Notes (Signed)
03/30/2021  I have seen and evaluated the patient for trach f/u.  S:  Trach became dislodged, unable to get back in. He's in no distress on RA talking on phone denies pain/sob.  O: Blood pressure (!) 158/96, pulse (!) 101, temperature 98.5 F (36.9 C), temperature source Oral, resp. rate (!) 26, height 5' 10.98" (1.803 m), weight 79.6 kg, SpO2 100 %.  No distress Lungs clear Trach stoma dressed Heart sounds tachycardic, ext warm Ext without edema Moves all 4 ext, ambulatory  CBG ok No new labs or imaging  A:  Presumed pneumocystis pneumonia ARDS in AIDS patient complicated by profound deconditioning and delirium requiring tracheostomy.  Respiratory issues seem resolved.   P:  - Should be fine to leave trach out - Dress stoma daily - If does not close in 3 weeks consider ENT referral for surgical closure - We are available PRN   Erskine Emery MD [facility] Prefer epic messenger for cross cover needs If after hours, please call E-link

## 2021-03-30 NOTE — Progress Notes (Signed)
PMR Admission Coordinator Pre-Admission Assessment  Patient: Bruce Hunter is an 44 y.o., male MRN: [id number] DOB: 01/08/1976 Height: 5' 10.98" (180.3 cm) Weight: 79.6 kg (Simultaneous filing. User may not have seen previous data.)  Insurance Information HMO:     PPO:      PCP:      IPA:      80/20:      OTHER:  PRIMARY: [place] [employer]      Policy#: [id number]      Subscriber:  CM Name: fax auth      Phone#:      Fax#: [phone]  Pre-Cert#: [id number] auth for CIR provided via fax with updates due to fax listed above on 8/1     Employer:  Benefits:  Phone #: [phone]     Name:  Eff. Date: 03/05/21     Deduct: 0      Out of Pocket Max:       Life Max:  CIR: 100%      SNF:  Outpatient:      Co-Pay:  Home Health:       Co-Pay:  DME:      Co-Pay:  Providers:  SECONDARY:       Policy#:      Phone#:   Financial Counselor:       Phone#:   The "Data Collection Information Summary" for patients in Inpatient Rehabilitation Facilities with attached "Privacy Act Statement-Health Care Records" was provided and verbally reviewed with: N/A  Emergency Contact Information Contact Information     Name Relation Home Work Mobile   Dibenedetto,Brenda J Mother [phone]  [phone]   Bunch,Thomasina Friend   [phone]       Current Medical History  Patient Admitting Diagnosis: debility 2/2 respiratory failure, new trach  History of Present Illness: Bruce Hunter is a 44 y.o. male with a history of HIV/AIDS, chronic pain, diabetes mellitus type 2 with diabetic neuropathy, esophageal candidiasis, diverticulosis with recent GI bleeding.  Patient presented to [facility] on 6/30 secondary to acute respiratory failure with hypoxia.  Patient required ICU admission and intubation.  While admitted, patient developed severe AKI requiring initiation of CRRT.  Infectious disease was consulted as well for diagnosis of PCP PNA.  Work-up for possible meningitis was negative.  Patient was transitioned  from ETT to tracheostomy and was eventually successfully taken off CRRT.  Decannulated on 7/26 and tolerating room air.  Admission has also been complicated by recurrent anemia requiring multiple transfusions, as well as persistent BLE foot pain, workup  revealing gout.  Therapy evaluations were completed and pt was recommended for CIR.      Patient's medical record from [place] has been reviewed by the rehabilitation admission coordinator and physician.  Past Medical History  Past Medical History:  Diagnosis Date   Asthma    Diabetes mellitus without complication (HCC)    Gout    Hyperlipidemia    Hypertension    Obesity     Family History   family history includes Asthma in his mother; Cancer in his maternal grandmother and mother; Diabetes in his brother, maternal grandmother, mother, and another family member; Obesity in his brother, mother, and sister.  Prior Rehab/Hospitalizations Has the patient had prior rehab or hospitalizations prior to admission? Yes  Has the patient had major surgery during 100 days prior to admission? Yes   Current Medications  Current Facility-Administered Medications:    0.9 %  sodium chloride infusion, 250 mL, Intravenous, Continuous, Smith, Daniel   C, MD, Last Rate: 10 mL/hr at 03/21/21 1408, 250 mL at 03/21/21 1408   0.9 %  sodium chloride infusion, 250 mL, Intravenous, Continuous, Sommer, Steven E, MD, Last Rate: 10 mL/hr at 03/25/21 0345, 250 mL at 03/25/21 0345   acetaminophen (TYLENOL) tablet 650 mg, 650 mg, Oral, Q6H PRN, Mannam, Praveen, MD   albuterol (PROVENTIL) (2.5 MG/3ML) 0.083% nebulizer solution 2.5 mg, 2.5 mg, Nebulization, Q2H PRN, Gonzales, Nicole, MD, 2.5 mg at 03/22/21 0229   amLODipine (NORVASC) tablet 10 mg, 10 mg, Oral, Daily, Mannam, Praveen, MD, 10 mg at 03/26/21 1103   chlorhexidine gluconate (MEDLINE KIT) (PERIDEX) 0.12 % solution 15 mL, 15 mL, Mouth Rinse, BID, Smith, Daniel C, MD, 15 mL at 03/25/21 0854   Chlorhexidine  Gluconate Cloth 2 % PADS 6 each, 6 each, Topical, Daily, Ramaswamy, Murali, MD, 6 each at 03/26/21 0600   clindamycin (CLEOCIN) IVPB 900 mg, 900 mg, Intravenous, Q8H, Snider, Cynthia, MD, Last Rate: 100 mL/hr at 03/26/21 1329, 900 mg at 03/26/21 1329   clonazePAM (KLONOPIN) tablet 0.5 mg, 0.5 mg, Oral, BID, Mannam, Praveen, MD, 0.5 mg at 03/26/21 1100   [START ON 03/28/2021] Darbepoetin Alfa (ARANESP) injection 200 mcg, 200 mcg, Subcutaneous, Q Sun-1800, Goldsborough, Kellie, MD   dextrose 5 % solution, , Intravenous, Continuous, Goldsborough, Kellie, MD, Last Rate: 50 mL/hr at 03/23/21 1300, Infusion Verify at 03/23/21 1300   docusate sodium (COLACE) capsule 100 mg, 100 mg, Oral, BID PRN, Mannam, Praveen, MD   dolutegravir (TIVICAY) tablet 50 mg, 50 mg, Oral, Daily, Mannam, Praveen, MD, 50 mg at 03/26/21 1100   gabapentin (NEURONTIN) capsule 100 mg, 100 mg, Oral, Q12H, Mannam, Praveen, MD, 100 mg at 03/26/21 1108   heparin injection 1,000-6,000 Units, 1,000-6,000 Units, CRRT, PRN, Schertz, Robert, MD, 2,400 Units at 03/19/21 0943   heparin injection 5,000 Units, 5,000 Units, Subcutaneous, Q8H, McQuaid, Douglas B, MD, 5,000 Units at 03/26/21 1314   insulin aspart (novoLOG) injection 0-15 Units, 0-15 Units, Subcutaneous, TID WC, Mannam, Praveen, MD, 2 Units at 03/26/21 1313   insulin aspart (novoLOG) injection 0-5 Units, 0-5 Units, Subcutaneous, QHS, Mannam, Praveen, MD   insulin glargine (LANTUS) injection 5 Units, 5 Units, Subcutaneous, Daily, Sommer, Steven E, MD, 5 Units at 03/26/21 0933   ipratropium-albuterol (DUONEB) 0.5-2.5 (3) MG/3ML nebulizer solution 3 mL, 3 mL, Nebulization, TID, McQuaid, Douglas B, MD, 3 mL at 03/26/21 1350   labetalol (NORMODYNE) injection 10-20 mg, 10-20 mg, Intravenous, Q2H PRN, Alva, Rakesh V, MD, 20 mg at 03/19/21 1658   lamiVUDine (EPIVIR) tablet 300 mg, 300 mg, Oral, Daily, Mannam, Praveen, MD, 300 mg at 03/26/21 0932   lidocaine (LIDODERM) 5 % 1 patch, 1 patch,  Transdermal, Q24H, McQuaid, Douglas B, MD, 1 patch at 03/25/21 1838   lip balm (CARMEX) ointment, , Topical, PRN, McQuaid, Douglas B, MD   MEDLINE mouth rinse, 15 mL, Mouth Rinse, q12n4p, Mannam, Praveen, MD, 15 mL at 03/26/21 1300   midazolam (VERSED) injection 1-2 mg, 1-2 mg, Intravenous, Q2H PRN, Bowser, Grace E, NP, 2 mg at 03/24/21 0130   multivitamin with minerals tablet 1 tablet, 1 tablet, Oral, Daily, Nettey, Ralph A, MD, 1 tablet at 03/26/21 1313   ondansetron (ZOFRAN) injection 4 mg, 4 mg, Intravenous, Q6H PRN, Gonzales, Nicole, MD, 4 mg at 03/24/21 2151   oxyCODONE (Oxy IR/ROXICODONE) immediate release tablet 5 mg, 5 mg, Oral, Q6H PRN, Nettey, Ralph A, MD, 5 mg at 03/26/21 1103   polyethylene glycol (MIRALAX / GLYCOLAX) packet 17 g, 17 g,   Oral, Daily PRN, Mannam, Praveen, MD   predniSONE (DELTASONE) tablet 20 mg, 20 mg, Oral, Q breakfast, Mannam, Praveen, MD, 20 mg at 03/26/21 0932   primaquine tablet 30 mg, 30 mg, Oral, Daily, Mannam, Praveen, MD, 30 mg at 03/26/21 0933   sodium chloride flush (NS) 0.9 % injection 10-40 mL, 10-40 mL, Intracatheter, Q12H, Wert, Michael B, MD, 10 mL at 03/26/21 1111   sodium chloride flush (NS) 0.9 % injection 10-40 mL, 10-40 mL, Intracatheter, PRN, Wert, Michael B, MD  Patients Current Diet:  Diet Order             DIET DYS 3 Room service appropriate? Yes; Fluid consistency: Honey Thick  Diet effective now                   Precautions / Restrictions Precautions Precautions: Fall Precaution Comments: trach with PMV, flexiseal, foley Restrictions Weight Bearing Restrictions: No   Has the patient had 2 or more falls or a fall with injury in the past year? Yes  Prior Activity Level Community (5-7x/wk): No DME used, but driving (likes to drive), walking dog, cutting grass. etc.  Prior Functional Level Self Care: Did the patient need help bathing, dressing, using the toilet or eating? Independent  Indoor Mobility: Did the patient need  assistance with walking from room to room (with or without device)? Independent  Stairs: Did the patient need assistance with internal or external stairs (with or without device)? Independent  Functional Cognition: Did the patient need help planning regular tasks such as shopping or remembering to take medications? Independent  Home Assistive Devices / Equipment Home Assistive Devices/Equipment: Cane (specify quad or straight) (sometimes at home) Home Equipment: Cane - single point  Prior Device Use: Indicate devices/aids used by the patient prior to current illness, exacerbation or injury? None of the above  Current Functional Level Cognition  Overall Cognitive Status: Impaired/Different from baseline Difficult to assess due to: Tracheostomy Current Attention Level: Selective Orientation Level: (P) Oriented X4 Following Commands: Follows one step commands inconsistently, Follows one step commands with increased time Safety/Judgement: Decreased awareness of safety, Decreased awareness of deficits General Comments: Has PMV but then removes it half way through    Extremity Assessment (includes Sensation/Coordination)  Upper Extremity Assessment: RUE deficits/detail, LUE deficits/detail RUE Deficits / Details: grip strength 3/5, wrist flexion 3/5, elbow flexion 2+/5, shoulder abduction 3-/5 RUE:  (unable to fully assess 2* CRRT/lines R side of neck) RUE Sensation:  (denies parasthesia) RUE Coordination: decreased fine motor, decreased gross motor LUE Deficits / Details: grip strength 3-/5, wrist flexion 3/5, elbow flexion 2+/5, shoulder abduction 2/5 LUE:  (Unable fully assess 2* multiple lines/trach) LUE Sensation:  (denies parasthesia) LUE Coordination: decreased fine motor, decreased gross motor  Lower Extremity Assessment: Defer to PT evaluation RLE Deficits / Details: trace toe and ankle, trance hip flex, abd/add knee extension, limited ankle  ROM, H/O ganglion cyst surgery LLE  Deficits / Details: same as right.    ADLs  Overall ADL's : Needs assistance/impaired Eating/Feeding: Supervision/ safety, Set up, Sitting Eating/Feeding Details (indicate cue type and reason): in recliner , using spoon to drink water post mouth care Grooming: Set up, Supervision/safety, Oral care, Sitting Grooming Details (indicate cue type and reason): in recliner Upper Body Bathing: Total assistance Lower Body Bathing: Total assistance Upper Body Dressing : Total assistance Lower Body Dressing: Total assistance Toilet Transfer: Minimal assistance, +2 for safety/equipment Toilet Transfer Details (indicate cue type and reason): sara stedy (bed>recliner) Toileting- Clothing Manipulation   and Hygiene: Total assistance Functional mobility during ADLs: Total assistance General ADL Comments: patient with significant weakness, has been intubated ~2 weeks currently requiring total care    Mobility  Overal bed mobility: Needs Assistance Bed Mobility: Rolling Rolling: Min guard Sidelying to sit: Mod assist Supine to sit: +2 for physical assistance, Max assist Sit to supine: +2 for physical assistance, Max assist Sit to sidelying: Mod assist General bed mobility comments: assisted to reposition on the bed including legs    Transfers  Overall transfer level: Needs assistance Equipment used: Ambulation equipment used Transfer via Lift Equipment:  (sara stedy) Transfers: Sit to/from Stand Sit to Stand: Min assist, From elevated surface General transfer comment: pt declined    Ambulation / Gait / Stairs / Wheelchair Mobility       Posture / Balance Dynamic Sitting Balance Sitting balance - Comments: Initially assist due to posterior lean but then able to maintain static Balance Overall balance assessment: Needs assistance Sitting-balance support: No upper extremity supported, Feet supported Sitting balance-Leahy Scale: Fair Sitting balance - Comments: Initially assist due to posterior  lean but then able to maintain static Standing balance support: Bilateral upper extremity supported Standing balance-Leahy Scale: Zero Standing balance comment: Stood x2 with sara stedy from bed    Special needs/care consideration Diabetic management yes   Previous Home Environment (from acute therapy documentation) Living Arrangements: Alone Available Help at Discharge: Family, Available PRN/intermittently Type of Home: Apartment Home Layout: One level Home Access: Stairs to enter Entrance Stairs-Number of Steps: 2 Bathroom Shower/Tub: Walk-in shower Bathroom Toilet: Standard Home Care Services: No Additional Comments: girlfriend works as PCA but tries to be over at patient's apartment "as much as I can." Plans to have toilet replaced with handicap height toilet  Discharge Living Setting Plans for Discharge Living Setting: Patient's home, Alone Type of Home at Discharge: Apartment Discharge Home Layout: One level Discharge Home Access: Stairs to enter Entrance Stairs-Rails: None (2 columns he can use to support) Entrance Stairs-Number of Steps: 2 Discharge Bathroom Shower/Tub: Walk-in shower Discharge Bathroom Toilet: Standard Discharge Bathroom Accessibility: Yes How Accessible: Accessible via walker Does the patient have any problems obtaining your medications?: No  Social/Family/Support Systems Patient Roles: Partner (has a girlfriend) Anticipated Caregiver: mom Brenda Woods and other family members/friends Anticipated Caregiver's Contact Information: Brenda: [phone] Ability/Limitations of Caregiver: no physical care Caregiver Availability: Intermittent Discharge Plan Discussed with Primary Caregiver: Yes Is Caregiver In Agreement with Plan?: Yes Does Caregiver/Family have Issues with Lodging/Transportation while Pt is in Rehab?: No  Goals Patient/Family Goal for Rehab: PT/OT supervision to mod I, SLP mod I Expected length of stay: 12-14 days Additional  Information: new trach Pt/Family Agrees to Admission and willing to participate: Yes Program Orientation Provided & Reviewed with Pt/Caregiver Including Roles  & Responsibilities: Yes  Barriers to Discharge: Insurance for SNF coverage, Decreased caregiver support  Decrease burden of Care through IP rehab admission: n/a  Possible need for SNF placement upon discharge: Not anticipated.   Patient Condition: I have reviewed medical records from [place], spoken with CM, and patient and family member. I met with patient at the bedside for inpatient rehabilitation assessment.  Patient will benefit from ongoing PT, OT, and SLP, can actively participate in 3 hours of therapy a day 5 days of the week, and can make measurable gains during the admission.  Patient will also benefit from the coordinated team approach during an Inpatient Acute Rehabilitation admission.  The patient will receive intensive therapy as well as   Rehabilitation physician, nursing, social worker, and care management interventions.  Due to bladder management, bowel management, safety, skin/wound care, disease management, medication administration, pain management, and patient education the patient requires 24 hour a day rehabilitation nursing.  The patient is currently min to max assist with mobility and basic ADLs.  Discharge setting and therapy post discharge at home with home health is anticipated.  Patient has agreed to participate in the Acute Inpatient Rehabilitation Program and will admit today.  Preadmission Screen Completed By:  Jovana Rembold E David Rodriquez, PT, DPT 03/26/2021 4:11 PM ______________________________________________________________________   Discussed status with Dr. Lovorn on 03/30/21  at 12:37 PM  and received approval for admission today.  Admission Coordinator:  Winta Barcelo E Emiley Digiacomo, PT, time 12:37 PM /Date 03/30/21    Assessment/Plan: Diagnosis: Does the need for close, 24 hr/day Medical supervision in concert with the  patient's rehab needs make it unreasonable for this patient to be served in a less intensive setting? Yes Co-Morbidities requiring supervision/potential complications: PCP PNA; AKI- improved- s/p CRRT; trach s/p decannulation today; recurrent anemia s/p multiple transfusions; gout in B/L feet Due to bladder management, bowel management, safety, skin/wound care, disease management, medication administration, pain management, and patient education, does the patient require 24 hr/day rehab nursing? Yes Does the patient require coordinated care of a physician, rehab nurse, PT, OT, and SLP to address physical and functional deficits in the context of the above medical diagnosis(es)? Yes Addressing deficits in the following areas: balance, endurance, locomotion, strength, transferring, bowel/bladder control, bathing, dressing, feeding, grooming, toileting, speech, language, and swallowing Can the patient actively participate in an intensive therapy program of at least 3 hrs of therapy 5 days a week? Yes The potential for patient to make measurable gains while on inpatient rehab is good Anticipated functional outcomes upon discharge from inpatient rehab: modified independent and supervision PT, modified independent and supervision OT, modified independent SLP Estimated rehab length of stay to reach the above functional goals is: 12-14 days Anticipated discharge destination: Home 10. Overall Rehab/Functional Prognosis: good   MD Signature:  

## 2021-03-30 NOTE — Progress Notes (Signed)
Patient arrived on unit, oriented to unit. Reviewed medications, therapy schedule, rehab routine and plan of care. States an understanding of information reviewed. No complications noted at this time. Patient reports no pain and is AX4 Bruce Hunter Bruce Hunter  

## 2021-03-30 NOTE — Progress Notes (Addendum)
Subjective:   He is happy to no longer have tracheostomy, he was unhappy about pain medications overnight   Antibiotics:  Anti-infectives (From admission, onward)    Start     Dose/Rate Route Frequency Ordered Stop   03/31/21 0800  dolutegravir (TIVICAY) tablet 50 mg        50 mg Oral Daily 03/30/21 1453     03/31/21 0800  lamiVUDine (EPIVIR) tablet 300 mg        300 mg Oral Daily 03/30/21 1453     03/30/21 2200  clindamycin (CLEOCIN) IVPB 900 mg        900 mg 100 mL/hr over 30 Minutes Intravenous Every 8 hours 03/30/21 1453 04/02/21 0559       Medications: Scheduled Meds:  [START ON 03/31/2021] amLODipine  10 mg Oral Daily   chlorhexidine gluconate (MEDLINE KIT)  15 mL Mouth Rinse BID   clonazePAM  0.5 mg Oral BID   [START ON 03/31/2021] dolutegravir  50 mg Oral Daily   enoxaparin (LOVENOX) injection  40 mg Subcutaneous Q24H   gabapentin  200 mg Oral Q12H   heparin injection (subcutaneous)  5,000 Units Subcutaneous Q8H   hydrALAZINE  25 mg Oral Q8H   insulin aspart  0-15 Units Subcutaneous TID WC   insulin aspart  0-5 Units Subcutaneous QHS   [START ON 03/31/2021] insulin glargine-yfgn  5 Units Subcutaneous Daily   [START ON 03/31/2021] lamiVUDine  300 mg Oral Daily   lidocaine  1 patch Transdermal Q24H   mouth rinse  15 mL Mouth Rinse q12n4p   [START ON 03/31/2021] multivitamin with minerals  1 tablet Oral Daily   [START ON 03/31/2021] predniSONE  60 mg Oral Q breakfast   Followed by   Derrill Memo ON 04/03/2021] predniSONE  50 mg Oral Q breakfast   Followed by   Derrill Memo ON 04/06/2021] predniSONE  40 mg Oral Q breakfast   Followed by   Derrill Memo ON 04/08/2021] predniSONE  30 mg Oral Q breakfast   Followed by   Derrill Memo ON 04/10/2021] predniSONE  20 mg Oral Q breakfast   Followed by   Derrill Memo ON 04/12/2021] predniSONE  10 mg Oral Q breakfast   Followed by   Derrill Memo ON 04/14/2021] predniSONE  5 mg Oral Q breakfast   [START ON 03/31/2021] predniSONE  20 mg Oral Q breakfast    Continuous Infusions:  clindamycin (CLEOCIN) IV     PRN Meds:.acetaminophen, albuterol, alum & mag hydroxide-simeth, bisacodyl, calcium carbonate, diphenhydrAMINE, guaiFENesin-dextromethorphan, lip balm, oxyCODONE, polyethylene glycol, prochlorperazine **OR** prochlorperazine **OR** prochlorperazine, sodium phosphate, traZODone    Objective: Weight change:  No intake or output data in the 24 hours ending 03/30/21 1531  Blood pressure 131/81, pulse 91, temperature 98.8 F (37.1 C), temperature source Oral, resp. rate 18, height _0  (1.778 m), weight 84.1 kg. Temp:  [98 F (36.7 C)-98.8 F (37.1 C)] 98.8 F (37.1 C) (07/26 1509) Pulse Rate:  [87-106] 91 (07/26 1509) Resp:  [16-26] 18 (07/26 1509) BP: (131-162)/(81-107) 131/81 (07/26 1509) SpO2:  [85 %-100 %] 97 % (07/26 1056) FiO2 (%):  [21 %-28 %] 21 % (07/26 0841) Weight:  [84.1 kg] 84.1 kg (07/26 1509)  Physical Exam: Physical Exam Constitutional:      Appearance: He is well-developed. He is obese.  HENT:     Head: Normocephalic and atraumatic.     Nose: Nose normal.  Eyes:     General: No scleral icterus.       Right  eye: No discharge.        Left eye: No discharge.  Neck:     Trachea: No tracheostomy.  Cardiovascular:     Heart sounds: No murmur heard.   No friction rub. No gallop.  Pulmonary:     Effort: Pulmonary effort is normal. No respiratory distress.     Breath sounds: No stridor. Rhonchi present. No wheezing.  Abdominal:     General: There is no distension.     Palpations: Abdomen is soft.  Musculoskeletal:     Cervical back: Normal range of motion and neck supple.     Left foot: Tenderness present.  Skin:    General: Skin is warm and dry.  Neurological:     General: No focal deficit present.     Mental Status: He is alert and oriented to person, place, and time.  Psychiatric:        Attention and Perception: Attention and perception normal.        Mood and Affect: Mood normal.        Speech:  Speech normal.        Behavior: Behavior normal.        Thought Content: Thought content normal.        Cognition and Memory: Cognition and memory normal.        Judgment: Judgment normal.     Comments: Was swearing a fair amount perhaps a bit disinhibited frontal lobe     CBC:    BMET Recent Labs    03/28/21 0025 03/29/21 0921  NA 137 134*  K 3.4* 3.6  CL 104 98  CO2 24 28  GLUCOSE 113* 126*  BUN 17 13  CREATININE 1.20 1.24  CALCIUM 8.6* 9.1      Liver Panel  Recent Labs    03/28/21 0025  ALBUMIN 2.2*        Sedimentation Rate No results for input(s): ESRSEDRATE in the last 72 hours. C-Reactive Protein No results for input(s): CRP in the last 72 hours.  Micro Results: Recent Results (from the past 720 hour(s))  Culture, blood (routine x 2)     Status: None   Collection Time: 03/04/21  6:14 PM   Specimen: BLOOD LEFT FOREARM  Result Value Ref Range Status   Specimen Description   Final    BLOOD LEFT FOREARM Performed at [facility] [address], [place], [place] [address]    Special Requests   Final    BOTTLES DRAWN AEROBIC AND ANAEROBIC Blood Culture adequate volume Performed at [facility] [address], [place], [place] [address]    Culture   Final    NO GROWTH 5 DAYS Performed at [facility], [place] [address], [place], [place] [address]    Report Status 03/09/2021 FINAL  Final  Resp Panel by RT-PCR (Flu A&B, Covid) Nasopharyngeal Swab     Status: None   Collection Time: 03/04/21  7:52 PM   Specimen: Nasopharyngeal Swab; Nasopharyngeal(NP) swabs in vial transport medium  Result Value Ref Range Status   SARS Coronavirus 2 by RT PCR NEGATIVE NEGATIVE Final    Comment: (NOTE) SARS-CoV-2 target nucleic acids are NOT DETECTED.  The SARS-CoV-2 RNA is generally detectable in upper respiratory specimens during the acute phase of infection. The lowest concentration of SARS-CoV-2 viral copies this  assay can detect is 138 copies/mL. A negative result does not preclude SARS-Cov-2 infection and should not be used as the sole basis for treatment or  other patient management decisions. A negative result may occur with  improper specimen collection/handling, submission of specimen other than nasopharyngeal swab, presence of viral mutation(s) within the areas targeted by this assay, and inadequate number of viral copies(<138 copies/mL). A negative result must be combined with clinical observations, patient history, and epidemiological information. The expected result is Negative.  Fact Sheet for Patients:  EntrepreneurPulse.com.au  Fact Sheet for Healthcare Providers:  IncredibleEmployment.be  This test is no t yet approved or cleared by the [place] FDA and  has been authorized for detection and/or diagnosis of SARS-CoV-2 by FDA under an Emergency Use Authorization (EUA). This EUA will remain  in effect (meaning this test can be used) for the duration of the COVID-19 declaration under Section 564(b)(1) of the Act, 21 U.S.C.section 360bbb-3(b)(1), unless the authorization is terminated  or revoked sooner.       Influenza A by PCR NEGATIVE NEGATIVE Final   Influenza B by PCR NEGATIVE NEGATIVE Final    Comment: (NOTE) The Xpert Xpress SARS-CoV-2/FLU/RSV plus assay is intended as an aid in the diagnosis of influenza from Nasopharyngeal swab specimens and should not be used as a sole basis for treatment. Nasal washings and aspirates are unacceptable for Xpert Xpress SARS-CoV-2/FLU/RSV testing.  Fact Sheet for Patients: EntrepreneurPulse.com.au  Fact Sheet for Healthcare Providers: IncredibleEmployment.be  This test is not yet approved or cleared by the [place] FDA and has been authorized for detection and/or diagnosis of SARS-CoV-2 by FDA under an Emergency Use Authorization (EUA). This EUA will  remain in effect (meaning this test can be used) for the duration of the COVID-19 declaration under Section 564(b)(1) of the Act, 21 U.S.C. section 360bbb-3(b)(1), unless the authorization is terminated or revoked.  Performed at [facility], [place] [address] [place], [place] [address]   MRSA PCR Screening     Status: None   Collection Time: 03/05/21 10:41 AM  Result Value Ref Range Status   MRSA by PCR NEGATIVE NEGATIVE Final    Comment:        The GeneXpert MRSA Assay (FDA approved for NASAL specimens only), is one component of a comprehensive MRSA colonization surveillance program. It is not intended to diagnose MRSA infection nor to guide or monitor treatment for MRSA infections. Performed at [facility], [place] [address], [place], [place] [address]   Respiratory (~20 pathogens) panel by PCR     Status: None   Collection Time: 03/05/21 10:42 AM   Specimen: Nasopharyngeal Swab; Respiratory  Result Value Ref Range Status   Adenovirus NOT DETECTED NOT DETECTED Final   Coronavirus 229E NOT DETECTED NOT DETECTED Final    Comment: (NOTE) The Coronavirus on the Respiratory Panel, DOES NOT test for the novel  Coronavirus (2019 nCoV)    Coronavirus HKU1 NOT DETECTED NOT DETECTED Final   Coronavirus NL63 NOT DETECTED NOT DETECTED Final   Coronavirus OC43 NOT DETECTED NOT DETECTED Final   Metapneumovirus NOT DETECTED NOT DETECTED Final   Rhinovirus / Enterovirus NOT DETECTED NOT DETECTED Final   Influenza A NOT DETECTED NOT DETECTED Final   Influenza B NOT DETECTED NOT DETECTED Final   Parainfluenza Virus 1 NOT DETECTED NOT DETECTED Final   Parainfluenza Virus 2 NOT DETECTED NOT DETECTED Final   Parainfluenza Virus 3 NOT DETECTED NOT DETECTED Final   Parainfluenza Virus 4 NOT DETECTED NOT DETECTED Final   Respiratory Syncytial Virus NOT DETECTED NOT DETECTED Final   Bordetella pertussis NOT DETECTED NOT DETECTED Final   Bordetella  Parapertussis NOT DETECTED NOT DETECTED Final   Chlamydophila pneumoniae NOT DETECTED NOT DETECTED Final   Mycoplasma pneumoniae NOT DETECTED NOT DETECTED Final    Comment: Performed at [facility], [place] [address], [place], [place] [address]  Gastrointestinal Panel by PCR , Stool     Status: None   Collection Time: 03/05/21  6:28 PM   Specimen: Stool  Result Value Ref Range Status   Campylobacter species NOT DETECTED NOT DETECTED Final   Plesimonas shigelloides NOT DETECTED NOT DETECTED Final   Salmonella species NOT DETECTED NOT DETECTED Final   Yersinia enterocolitica NOT DETECTED NOT DETECTED Final   Vibrio species NOT DETECTED NOT DETECTED Final   Vibrio cholerae NOT DETECTED NOT DETECTED Final   Enteroaggregative E coli (EAEC) NOT DETECTED NOT DETECTED Final   Enteropathogenic E coli (EPEC) NOT DETECTED NOT DETECTED Final   Enterotoxigenic E coli (ETEC) NOT DETECTED NOT DETECTED Final   Shiga like toxin producing E coli (STEC) NOT DETECTED NOT DETECTED Final   Shigella/Enteroinvasive E coli (EIEC) NOT DETECTED NOT DETECTED Final   Cryptosporidium NOT DETECTED NOT DETECTED Final   Cyclospora cayetanensis NOT DETECTED NOT DETECTED Final   Entamoeba histolytica NOT DETECTED NOT DETECTED Final   Giardia lamblia NOT DETECTED NOT DETECTED Final   Adenovirus F40/41 NOT DETECTED NOT DETECTED Final   Astrovirus NOT DETECTED NOT DETECTED Final   Norovirus GI/GII NOT DETECTED NOT DETECTED Final   Rotavirus A NOT DETECTED NOT DETECTED Final   Sapovirus (I, II, IV, and V) NOT DETECTED NOT DETECTED Final    Comment: Performed at [facility], [place] [place], [place] [address]  Pneumocystis smear by DFA     Status: None   Collection Time: 03/06/21  1:09 PM   Specimen: Sputum; Respiratory  Result Value Ref Range Status   Specimen Source-PJSRC SPUTUM  Final   Pneumocystis jiroveci Ag NEGATIVE  Final    Comment: Performed at [facility] Performed at  [facility] Performed at [facility], [place] [address], [place], [place] [address]   Culture, Respiratory w Gram Stain     Status: None   Collection Time: 03/06/21  1:10 PM   Specimen: Tracheal Aspirate; Respiratory  Result Value Ref Range Status   Specimen Description   Final    TRACHEAL ASPIRATE Performed at [facility] [address] [place], [place] [address]    Special Requests   Final    Immunocompromised Performed at [facility], [place] [address], [place], [place] [address]    Gram Stain   Final    FEW WBC PRESENT,BOTH PMN AND MONONUCLEAR RARE SQUAMOUS EPITHELIAL CELLS PRESENT RARE GRAM POSITIVE RODS RARE GRAM POSITIVE COCCI IN PAIRS Performed at [facility], [place] [address], [place], [place] [address]    Culture RARE STAPHYLOCOCCUS AUREUS  Final   Report Status 03/09/2021 FINAL  Final   Organism ID, Bacteria STAPHYLOCOCCUS AUREUS  Final      Susceptibility   Staphylococcus aureus - MIC*    CIPROFLOXACIN <=0.5 SENSITIVE Sensitive     ERYTHROMYCIN <=0.25 SENSITIVE Sensitive     GENTAMICIN <=0.5 SENSITIVE Sensitive     OXACILLIN <=0.25 SENSITIVE Sensitive     TETRACYCLINE >=16 RESISTANT Resistant     VANCOMYCIN <=0.5 SENSITIVE Sensitive     TRIMETH/SULFA <=10 SENSITIVE Sensitive     CLINDAMYCIN <=0.25 SENSITIVE Sensitive     RIFAMPIN <=0.5 SENSITIVE Sensitive  Inducible Clindamycin NEGATIVE Sensitive     * RARE STAPHYLOCOCCUS AUREUS  Blastomyces Antigen     Status: None   Collection Time: 03/12/21  5:55 PM   Specimen: Blood  Result Value Ref Range Status   Blastomyces Antigen None Detected None Detected ng/mL Final    Comment: (NOTE) Results reported as ng/mL in 0.2 - 14.7 ng/mL range Results above the limit of detection but below 0.2 ng/mL are reported as 'Positive, Below the Limit of Quantification' Results above 14.7 ng/mL are reported as 'Positive, Above the Limit of  Quantification'    Specimen Type SERUM  Final    Comment: (NOTE) Performed At: [facility] [place], [place] [id number] Bruce Donath MD PJ:[id number]   Virus culture     Status: None   Collection Time: 03/12/21  7:08 PM   Specimen: CSF  Result Value Ref Range Status   Viral Culture No virus isolated.  Corrected    Comment: (NOTE) Performed At: [facility] [address] [place], [place] [id number] Rush Farmer MD [id number] CORRECTED ON 07/20 AT 3790: PREVIOUSLY REPORTED AS Comment    Source of Sample BLOOD  Final    Comment: Performed at [facility] [address], [place], [place] [address]  Virus culture     Status: None   Collection Time: 03/13/21  4:42 PM   Specimen: PATH Cytology CSF; Cerebrospinal Fluid  Result Value Ref Range Status   Viral Culture No virus isolated.  Corrected    Comment: (NOTE) Please indicate source of specimen on all future request forms. Performed At: [facility] [address] [place], [place] [id number] Rush Farmer MD AS:[id number] CORRECTED ON 07/19 AT 1036: PREVIOUSLY REPORTED AS Comment    Source of Sample CSF  Final    Comment: Performed at [facility], [place] [address] [place], [place] [address]  CSF culture w Gram Stain     Status: None   Collection Time: 03/13/21  4:42 PM   Specimen: PATH Cytology CSF; Cerebrospinal Fluid  Result Value Ref Range Status   Specimen Description   Final    CSF Performed at [facility] [address], [place], [place] [address]    Special Requests   Final    NONE Performed at [facility], [place] [address], [place], [place] [address]    Gram Stain   Final    NO ORGANISMS SEEN Gram Stain Report Called to,Read Back By and Verified With: ETHAN RN AT 4818 ON 03/13/21 BY S.VANHOORNE Performed at [facility], [place] [address] [place], [place] [address]     Culture   Final    NO GROWTH 3 DAYS Performed at [facility], [place] [address] [place], [place] [address]    Report Status 03/17/2021 FINAL  Final  Fungus Culture With Stain     Status: Abnormal   Collection Time: 03/13/21  4:42 PM   Specimen: PATH Cytology CSF; Cerebrospinal Fluid  Result Value Ref Range Status   Fungus Stain QNSTST (A)  Final    Comment: (NOTE) Test not performed. Insufficient specimen to perform or complete analysis.      Darrick Meigs S. was notified 03/17/2021.    Fungus (Mycology) Culture QNSTST (A)  Final    Comment: (NOTE) Test not performed. Insufficient specimen to perform or complete analysis.      Darrick Meigs S. was notified 03/17/2021. Performed At: [facility] [place], [place]  [id number] Rush Farmer MD TD:[id number]    Fungal Source CSF  Final    Comment: Performed at [facility], [place] [address] [place], [place] [address]  Culture, fungus without smear     Status: None (Preliminary result)   Collection Time: 03/13/21  4:42 PM   Specimen: PATH Cytology CSF; Cerebrospinal Fluid  Result Value Ref Range Status   Specimen Description   Final    CSF Performed at [place] [address] [place], [place] [address]    Special Requests   Final    NONE Performed at [facility], [place] [address] [place], [place] [address]    Culture   Final    NO FUNGUS ISOLATED AFTER 17 DAYS Performed at [facility], [place] [address] [place], [place] [address]    Report Status PENDING  Incomplete  Culture, Respiratory w Gram Stain     Status: None   Collection Time: 03/14/21  3:15 AM   Specimen: Tracheal Aspirate; Respiratory  Result Value Ref Range Status   Specimen Description   Final    TRACHEAL ASPIRATE Performed at [facility] [address] [place], [place] [address]    Special Requests   Final    NONE Performed at [facility], [place] [address], [place], [place] [address]    Gram Stain   Final    MODERATE WBC PRESENT,BOTH PMN AND MONONUCLEAR NO ORGANISMS SEEN    Culture   Final    NO GROWTH 2 DAYS Performed at [facility], [place] [address], [place], [place] [address]    Report Status 03/16/2021 FINAL  Final  Culture, Respiratory w Gram Stain     Status: None   Collection Time: 03/16/21  9:40 AM   Specimen: Bronchoalveolar Lavage; Respiratory  Result Value Ref Range Status   Specimen Description   Final    BRONCHIAL ALVEOLAR LAVAGE Performed at [facility] [address], [place], [place] [address]    Special Requests   Final    NONE Performed at [facility], [place] [address], [place], [place] [address]    Gram Stain   Final    FEW WBC PRESENT,BOTH PMN AND MONONUCLEAR NO ORGANISMS SEEN    Culture   Final    RARE Normal respiratory flora-no Staph aureus or Pseudomonas seen Performed at [facility], [address] [place], [place] [address]    Report Status 03/18/2021 FINAL  Final  Fungus Culture With Stain     Status: None (Preliminary result)   Collection Time: 03/16/21  9:40 AM   Specimen: Bronchoalveolar Lavage  Result Value Ref Range Status   Fungus Stain Final report  Final    Comment: (NOTE) Performed At: [facility] [place], [place] [id number] Rush Farmer MD [id number]    Fungus (Mycology) Culture PENDING  Incomplete   Fungal Source BRONCHIAL ALVEOLAR LAVAGE  Final    Comment: Performed at [facility], [place] [address] [place], [place] [address]  Pneumocystis smear by DFA     Status: None   Collection Time: 03/16/21  9:40 AM   Specimen: Bronchoalveolar Lavage; Respiratory  Result Value Ref Range Status   Specimen Source-PJSRC BRONCHIAL ALVEOLAR LAVAGE  Final   Pneumocystis jiroveci Ag NEGATIVE  Final    Comment: Performed at [facility]  [address], [place], [place] [address]  Fungus Culture Result  Status: None   Collection Time: 03/16/21  9:40 AM  Result Value Ref Range Status   Result 1 Comment  Final    Comment: (NOTE) KOH/Calcofluor preparation:  no fungus observed. Performed At: [facility] [place], [place] [id number] Rush Farmer MD WU:[id number]   Fungus Culture With Stain     Status: None (Preliminary result)   Collection Time: 03/16/21  9:41 AM   Specimen: Bronchoalveolar Lavage  Result Value Ref Range Status   Fungus Stain Final report  Final    Comment: (NOTE) Performed At: [facility] [address] [place], [place] [id number] Rush Farmer MD [id number]    Fungus (Mycology) Culture PENDING  Incomplete   Fungal Source BRONCHIAL ALVEOLAR LAVAGE  Final    Comment: Performed at [facility], [place] [address], [place], [place] [address]  Culture, Respiratory w Gram Stain     Status: None   Collection Time: 03/16/21  9:41 AM   Specimen: Bronchoalveolar Lavage; Respiratory  Result Value Ref Range Status   Specimen Description   Final    BRONCHIAL ALVEOLAR LAVAGE Performed at [place] [address] [place], [place] [address]    Special Requests   Final    NONE Performed at [facility], [place] [address] [place], [place] [address]    Gram Stain   Final    NO WBC SEEN NO ORGANISMS SEEN Performed at [facility], [place] [address] [place], [place] [address]    Culture RARE STAPHYLOCOCCUS EPIDERMIDIS  Final   Report Status 03/18/2021 FINAL  Final   Organism ID, Bacteria STAPHYLOCOCCUS EPIDERMIDIS  Final      Susceptibility   Staphylococcus epidermidis - MIC*    CIPROFLOXACIN 4 RESISTANT Resistant     ERYTHROMYCIN >=8 RESISTANT Resistant     GENTAMICIN <=0.5 SENSITIVE Sensitive     OXACILLIN >=4 RESISTANT Resistant     TETRACYCLINE >=16 RESISTANT Resistant     VANCOMYCIN 1 SENSITIVE Sensitive      TRIMETH/SULFA 40 SENSITIVE Sensitive     CLINDAMYCIN >=8 RESISTANT Resistant     RIFAMPIN 2 INTERMEDIATE Intermediate     Inducible Clindamycin NEGATIVE Sensitive     * RARE STAPHYLOCOCCUS EPIDERMIDIS  Fungus Culture Result     Status: None   Collection Time: 03/16/21  9:41 AM  Result Value Ref Range Status   Result 1 Comment  Final    Comment: (NOTE) KOH/Calcofluor preparation:  no fungus observed. Performed At: [facility] [place], [place] [id number] Rush Farmer MD [id number]     Studies/Results: DG Shoulder Left Port  Result Date: 03/28/2021 CLINICAL DATA:  Left shoulder pain.  No reported injury. EXAM: LEFT SHOULDER COMPARISON:  None. FINDINGS: There is no evidence of fracture or dislocation. There is no evidence of arthropathy or other focal bone abnormality. Soft tissues are unremarkable. IMPRESSION: Negative. Electronically Signed   By: Lucienne Capers M.D.   On: 03/28/2021 18:59   DG Swallowing Func-Speech Pathology  Result Date: 03/30/2021 Formatting of this result is different from the original. Objective Swallowing Evaluation: Type of Study: MBS-Modified Barium Swallow Study  Patient Details Name: Bruce Hunter MRN: [id number] Date of Birth: Mar 12, 1976 Today's Date: 03/30/2021 Time: SLP Start Time (ACUTE ONLY): 12 -SLP Stop Time (ACUTE ONLY): 1232 SLP Time Calculation (min) (ACUTE ONLY): 12 min Past Medical History: Past Medical History: Diagnosis Date  Asthma   Diabetes mellitus without complication ([place])   Gout   Hyperlipidemia  Hypertension   Obesity  Past Surgical History: Past Surgical History: Procedure Laterality Date  BIOPSY  02/15/2021  Procedure: BIOPSY;  Surgeon: Ronnette Juniper, MD;  Location: [facility];  Service: Gastroenterology;;  COLONOSCOPY WITH PROPOFOL N/A 02/15/2021  Procedure: COLONOSCOPY WITH PROPOFOL;  Surgeon: Ronnette Juniper, MD;  Location: [facility];  Service: Gastroenterology;  Laterality: N/A;  ESOPHAGOGASTRODUODENOSCOPY  (EGD) WITH PROPOFOL N/A 02/15/2021  Procedure: ESOPHAGOGASTRODUODENOSCOPY (EGD) WITH PROPOFOL;  Surgeon: Ronnette Juniper, MD;  Location: [facility];  Service: Gastroenterology;  Laterality: N/A;  GANGLION CYST EXCISION Right 02/26/2020  Procedure: EXCISION TOPHUS RIGHT FOOT;  Surgeon: Evelina Bucy, DPM;  Location: [facility];  Service: Podiatry;  Laterality: Right;  NO PAST SURGERIES   HPI: 45 y.o. male with a past medical history significant for HIV/AIDS, CKD, asthma, obesity, DM2 admitted for acute hypoxic respiratory failure with sepsis, requiring ventilator support on 7/1 and tracheostomy 7/13, s/p decannulation 7/26 am.  Subjective: alert, cooperative upright in chair for procedure Assessment / Plan / Recommendation CHL IP CLINICAL IMPRESSIONS 03/30/2021 Clinical Impression Repeat MBSS revealed excellent improvements in swallow function likely since trach decannulation this am. Oropharyngeal swallow now grossly within functional limits. No oral deficits appreciated. Pt with trace transient laryngeal penetration with thin liquids on occasion (PAS-2) but penetrates spontaneously cleared post swallow. No pharyngeal residuals exhibited with any PO. No aspiration evidenced with any PO. Recommend diet advancement to regular and thin liquids with meds as tolerated. SLP to follow up x1 for diet tolerance. Pt and RN notified of diet recommendations. SLP Visit Diagnosis Dysphagia, unspecified (R13.10) Attention and concentration deficit following -- Frontal lobe and executive function deficit following -- Impact on safety and function Mild aspiration risk   CHL IP TREATMENT RECOMMENDATION 03/30/2021 Treatment Recommendations Therapy as outlined in treatment plan below   Prognosis 03/30/2021 Prognosis for Safe Diet Advancement Good Barriers to Reach Goals -- Barriers/Prognosis Comment -- CHL IP DIET RECOMMENDATION 03/30/2021 SLP Diet Recommendations Regular solids;Thin liquid Liquid Administration via  Cup;Straw Medication Administration Whole meds with liquid Compensations Slow rate;Small sips/bites Postural Changes Seated upright at 90 degrees   CHL IP OTHER RECOMMENDATIONS 03/30/2021 Recommended Consults -- Oral Care Recommendations Oral care BID Other Recommendations --   CHL IP FOLLOW UP RECOMMENDATIONS 03/30/2021 Follow up Recommendations None   CHL IP FREQUENCY AND DURATION 03/30/2021 Speech Therapy Frequency (ACUTE ONLY) min 1 x/week Treatment Duration 1 week      CHL IP ORAL PHASE 03/30/2021 Oral Phase WFL Oral - Pudding Teaspoon -- Oral - Pudding Cup -- Oral - Honey Teaspoon -- Oral - Honey Cup -- Oral - Nectar Teaspoon -- Oral - Nectar Cup -- Oral - Nectar Straw -- Oral - Thin Teaspoon -- Oral - Thin Cup -- Oral - Thin Straw -- Oral - Puree -- Oral - Mech Soft -- Oral - Regular -- Oral - Multi-Consistency -- Oral - Pill -- Oral Phase - Comment --  CHL IP PHARYNGEAL PHASE 03/30/2021 Pharyngeal Phase Impaired Pharyngeal- Pudding Teaspoon -- Pharyngeal -- Pharyngeal- Pudding Cup -- Pharyngeal -- Pharyngeal- Honey Teaspoon -- Pharyngeal -- Pharyngeal- Honey Cup -- Pharyngeal -- Pharyngeal- Nectar Teaspoon -- Pharyngeal -- Pharyngeal- Nectar Cup Delayed swallow initiation-pyriform sinuses Pharyngeal -- Pharyngeal- Nectar Straw -- Pharyngeal -- Pharyngeal- Thin Teaspoon -- Pharyngeal -- Pharyngeal- Thin Cup Delayed swallow initiation-pyriform sinuses;Penetration/Aspiration before swallow;Penetration/Aspiration during swallow Pharyngeal Material does not enter airway;Material enters airway, remains ABOVE vocal cords then ejected out Pharyngeal- Thin Straw Penetration/Aspiration before swallow;Penetration/Aspiration during swallow;Delayed swallow initiation-pyriform sinuses Pharyngeal Material does not  enter airway;Material enters airway, remains ABOVE vocal cords then ejected out Pharyngeal- Puree WFL Pharyngeal -- Pharyngeal- Mechanical Soft Delayed swallow initiation-vallecula Pharyngeal -- Pharyngeal- Regular --  Pharyngeal -- Pharyngeal- Multi-consistency -- Pharyngeal -- Pharyngeal- Pill Delayed swallow initiation-pyriform sinuses;Penetration/Aspiration before swallow;Penetration/Aspiration during swallow Pharyngeal Material enters airway, remains ABOVE vocal cords then ejected out;Material does not enter airway Pharyngeal Comment --  CHL IP CERVICAL ESOPHAGEAL PHASE 03/30/2021 Cervical Esophageal Phase WFL Pudding Teaspoon -- Pudding Cup -- Honey Teaspoon -- Honey Cup -- Nectar Teaspoon -- Nectar Cup -- Nectar Straw -- Thin Teaspoon -- Thin Cup -- Thin Straw -- Puree -- Mechanical Soft -- Regular -- Multi-consistency -- Pill -- Cervical Esophageal Comment -- Chelsea E Hartness MA, CCC-SLP 03/30/2021, 1:01 PM                 Assessment/Plan:  INTERVAL HISTORY:   Patient had tracheostomy removed is on RA and transferring to patient rehab.   Active Problems:   Debility    Bruce Hunter is a 46 y.o. male with HIV/AIDS successfully suppressed on Dovato after initial period of imperfect adherence, admission with what became VDRF, and ARF in context of sepsis  #1  Respiratory failure: Now no longer requiring supplemental oxygen  We will have him finish 21 days of treatment for PCP and then stop his clindamycin primaquine and prednisone and place him on dapsone for PCP prevention  S post treatment also for MSSA and MRSE VAP   #2 acute renal failure: He had been on CVVHD at was a long and transferred here for intermittent dialysis but has had full recovery of his kidneys and normal creatinine now  #3 HIV AIDS: Continue Dovato and placed on dapsone for PCP prophylaxis once he finishes his empiric course of therapy  #4 bilateral foot pain and ankle pain seems due to chronic gout changes  #5 deconditioning going to go to inpatient rehab  NOTE PLEASE [place] THAT WE AER CONTACTED TO [place].  Bruce Hunter has an  appointment on 04/22/2021 at 145 with Dr. Tommy Medal  The [facility] is located in the [facility] at  [place] in [place].  [address], which is located to the left of the elevators.  Phone: [phone]  Fax: [phone]  [email or web address]  He should arrive 15  minutes before his appointment.  I will sign off for now  Please call with further questions.      LOS: 0 days   Alcide Evener 03/30/2021, 3:31 PM

## 2021-03-30 NOTE — Progress Notes (Signed)
Inpatient Rehabilitation Medication Review by a Pharmacist  A complete drug regimen review was completed for this patient to identify any potential clinically significant medication issues.  Clinically significant medication issues were identified:  no  Check AMION for pharmacist assigned to patient if future medication questions/issues arise during this admission.  Pharmacist comments:   Time spent performing this drug regimen review (minutes):  15   Bruce Hunter 03/30/2021 6:06 PM

## 2021-03-30 NOTE — Progress Notes (Signed)
  Speech Language Pathology Treatment: Dysphagia  Patient Details Name: Bruce Hunter MRN: [id number] DOB: 01-31-1976 Today's Date: 03/30/2021 Time: [id number] SLP Time Calculation (min) (ACUTE ONLY): 11 min  Assessment / Plan / Recommendation Clinical Impression  SLP followed up for dysphagia intervention. Of note pt s/p successful decannulation this morning, tolerating room air well, with good phonation and vocal intensity. Pt sitting at edge of the bed, pleasant cooperative. Pt eager for ability to advance diet if safely able. Assessed trials of thin liquids; (pt just completed oral care). With thins via cup sip, pt without overt difficulty. Pt did elicit isolated cough with thins via straw use, stating "tickle in his throat". Will repeat MBSS this date to guide current diet recommendations. Continue mechanical soft, honey thick liquids with frazier water protocol until repeat MBSS complete. Pt in agreement with plan.     HPI HPI: 45 y.o. male with a past medical history significant for HIV/AIDS, CKD, asthma, obesity, DM2 admitted for acute hypoxic respiratory failure with sepsis, requiring ventilator support on 7/1 and tracheostomy 7/13, s/p decannulation 7/26 am.      SLP Plan  MBS       Recommendations  Diet recommendations: Dysphagia 3 (mechanical soft);Honey-thick liquid (continue diet until repeat MBSS completed 12 pm this date) Liquids provided via: Cup Medication Administration: Whole meds with puree Supervision: Intermittent supervision to cue for compensatory strategies Compensations: Slow rate;Small sips/bites;Multiple dry swallows after each bite/sip;Clear throat intermittently Postural Changes and/or Swallow Maneuvers: Seated upright 90 degrees;Upright 30-60 min after meal                Oral Care Recommendations: Oral care BID;Oral care prior to ice chip/H20 Follow up Recommendations: 24 hour supervision/assistance;Home health SLP SLP Visit Diagnosis: Dysphagia,  oropharyngeal phase (R13.12) Plan: MBS       GO               [name] MA, CCC-SLP Acute Rehabilitation Services   03/30/2021, 9:38 AM

## 2021-03-30 NOTE — Plan of Care (Signed)
  Problem: Education: Goal: Knowledge of General Education information will improve Description: Including pain rating scale, medication(s)/side effects and non-pharmacologic comfort measures Outcome: Progressing   Problem: Health Behavior/Discharge Planning: Goal: Ability to manage health-related needs will improve Outcome: Progressing   Problem: Clinical Measurements: Goal: Ability to maintain clinical measurements within normal limits will improve Outcome: Progressing Goal: Will remain free from infection Outcome: Progressing Goal: Diagnostic test results will improve Outcome: Progressing Goal: Respiratory complications will improve Outcome: Progressing Goal: Cardiovascular complication will be avoided Outcome: Progressing   Problem: Activity: Goal: Risk for activity intolerance will decrease Outcome: Progressing   Problem: Nutrition: Goal: Adequate nutrition will be maintained Outcome: Progressing   Problem: Coping: Goal: Level of anxiety will decrease Outcome: Progressing   Problem: Elimination: Goal: Will not experience complications related to bowel motility Outcome: Progressing Goal: Will not experience complications related to urinary retention Outcome: Progressing   Problem: Pain Managment: Goal: General experience of comfort will improve Outcome: Progressing   Problem: Safety: Goal: Ability to remain free from injury will improve Outcome: Progressing   Problem: Skin Integrity: Goal: Risk for impaired skin integrity will decrease Outcome: Progressing   Problem: Safety: Goal: Non-violent Restraint(s) Outcome: Progressing   Problem: Fluid Volume: Goal: Hemodynamic stability will improve Outcome: Progressing   Problem: Clinical Measurements: Goal: Diagnostic test results will improve Outcome: Progressing Goal: Signs and symptoms of infection will decrease Outcome: Progressing   Problem: Respiratory: Goal: Ability to maintain adequate  ventilation will improve Outcome: Progressing

## 2021-03-31 DIAGNOSIS — M109 Gout, unspecified: Secondary | ICD-10-CM | POA: Diagnosis not present

## 2021-03-31 DIAGNOSIS — G7281 Critical illness myopathy: Secondary | ICD-10-CM | POA: Diagnosis not present

## 2021-03-31 DIAGNOSIS — B20 Human immunodeficiency virus [HIV] disease: Secondary | ICD-10-CM | POA: Diagnosis not present

## 2021-03-31 LAB — GLUCOSE, CAPILLARY
Glucose-Capillary: 123 mg/dL — ABNORMAL HIGH (ref 70–99)
Glucose-Capillary: 149 mg/dL — ABNORMAL HIGH (ref 70–99)
Glucose-Capillary: 187 mg/dL — ABNORMAL HIGH (ref 70–99)
Glucose-Capillary: 168 mg/dL — ABNORMAL HIGH (ref 70–99)

## 2021-03-31 LAB — COMPREHENSIVE METABOLIC PANEL
ALT: 8 U/L (ref 0–44)
AST: 15 U/L (ref 15–41)
Albumin: 2.6 g/dL — ABNORMAL LOW (ref 3.5–5.0)
Alkaline Phosphatase: 63 U/L (ref 38–126)
Anion gap: 9 (ref 5–15)
BUN: 14 mg/dL (ref 6–20)
CO2: 25 mmol/L (ref 22–32)
Calcium: 9.2 mg/dL (ref 8.9–10.3)
Chloride: 98 mmol/L (ref 98–111)
Creatinine, Ser: 1.21 mg/dL (ref 0.61–1.24)
GFR, Estimated: >60 mL/min (ref >60)
Glucose, Bld: 91 mg/dL (ref 70–99)
Potassium: 3.7 mmol/L (ref 3.5–5.1)
Sodium: 132 mmol/L — ABNORMAL LOW (ref 135–145)
Total Bilirubin: 0.5 mg/dL (ref 0.3–1.2)
Total Protein: 7.8 g/dL (ref 6.5–8.1)

## 2021-03-31 LAB — CBC WITH DIFFERENTIAL/PLATELET
Abs Immature Granulocytes: 0.1 10*3/uL — ABNORMAL HIGH (ref 0.00–0.07)
Basophils Absolute: 0 10*3/uL (ref 0.0–0.1)
Basophils Relative: 0 %
Eosinophils Absolute: 0.2 10*3/uL (ref 0.0–0.5)
Eosinophils Relative: 2 %
HCT: 29.5 % — ABNORMAL LOW (ref 39.0–52.0)
Hemoglobin: 8.9 g/dL — ABNORMAL LOW (ref 13.0–17.0)
Immature Granulocytes: 1 %
Lymphocytes Relative: 11 %
Lymphs Abs: 1.1 10*3/uL (ref 0.7–4.0)
MCH: 28.2 pg (ref 26.0–34.0)
MCHC: 30.2 g/dL (ref 30.0–36.0)
MCV: 93.4 fL (ref 80.0–100.0)
Monocytes Absolute: 1.6 10*3/uL — ABNORMAL HIGH (ref 0.1–1.0)
Monocytes Relative: 15 %
Neutro Abs: 7.3 10*3/uL (ref 1.7–7.7)
Neutrophils Relative %: 71 %
Platelets: 283 10*3/uL (ref 150–400)
RBC: 3.16 MIL/uL — ABNORMAL LOW (ref 4.22–5.81)
RDW: 19.7 % — ABNORMAL HIGH (ref 11.5–15.5)
WBC: 10.3 10*3/uL (ref 4.0–10.5)
nRBC: 0 % (ref 0.0–0.2)

## 2021-03-31 MED ORDER — CHLORHEXIDINE GLUCONATE CLOTH 2 % EX PADS
6.0000 | MEDICATED_PAD | Freq: Every day | CUTANEOUS | Status: DC
  Administered 2021-03-31 – 2021-04-05 (×6): 6 via TOPICAL

## 2021-03-31 MED ORDER — PROSOURCE PLUS PO LIQD
30.0000 mL | Freq: Three times a day (TID) | ORAL | Status: DC
  Administered 2021-03-31 – 2021-05-03 (×100): 30 mL via ORAL
  Filled 2021-03-31 (×97): qty 30

## 2021-03-31 MED ORDER — DAPSONE 100 MG PO TABS
100.0000 mg | ORAL_TABLET | Freq: Every day | ORAL | Status: DC
  Administered 2021-04-03 – 2021-05-03 (×30): 100 mg via ORAL
  Filled 2021-03-31 (×31): qty 1

## 2021-03-31 NOTE — Evaluation (Signed)
Occupational Therapy Assessment and Plan  Patient Details  Name: Bruce Hunter MRN: [id number] Date of Birth: [birth year]  OT Diagnosis: abnormal posture, acute pain, lumbago (low back pain), and muscle weakness (generalized) Rehab Potential:   ELOS: 14-18 days   Today's Date: 03/31/2021 OT Individual Time: 5009-3818 OT Individual Time Calculation (min): 54 min     Hospital Problem: Principal Problem:   Intensive care (ICU) myopathy Active Problems:   AIDS (acquired immune deficiency syndrome) ([place])   Debility   Gout attack   Past Medical History:  Past Medical History:  Diagnosis Date   Asthma    Diabetes mellitus without complication ([place])    Gout    Hyperlipidemia    Hypertension    Obesity    Past Surgical History:  Past Surgical History:  Procedure Laterality Date   BIOPSY  02/15/2021   Procedure: BIOPSY;  Surgeon: Ronnette Juniper, MD;  Location: [facility];  Service: Gastroenterology;;   COLONOSCOPY WITH PROPOFOL N/A 02/15/2021   Procedure: COLONOSCOPY WITH PROPOFOL;  Surgeon: Ronnette Juniper, MD;  Location: [facility];  Service: Gastroenterology;  Laterality: N/A;   ESOPHAGOGASTRODUODENOSCOPY (EGD) WITH PROPOFOL N/A 02/15/2021   Procedure: ESOPHAGOGASTRODUODENOSCOPY (EGD) WITH PROPOFOL;  Surgeon: Ronnette Juniper, MD;  Location: [facility];  Service: Gastroenterology;  Laterality: N/A;   GANGLION CYST EXCISION Right 02/26/2020   Procedure: EXCISION TOPHUS RIGHT FOOT;  Surgeon: Evelina Bucy, DPM;  Location: [place];  Service: Podiatry;  Laterality: Right;   NO PAST SURGERIES      Assessment & Plan Clinical Impression: Bruce Hunter is a 45 year old male with history of HTN, T2DM with neuropathy,  gout, asthma, recent admission 02/12/21 with N/V, 60 lbs wt loss and new diagnosis of advanced HIV and candida esophagitis. He was discharged to home on 06/13 but readmitted on 03/04/21 with acute hypoxemic respiratory failure with hypotension, acute renal  failure with hyperkalemia and peripheral edema due to sepsis from bilateral pneumocystis pneumonia.  He was started on broad spectrum antibiotics and steroids for ARDs and ultimately required intubation on  07/01 due to high oxygen needs with progressive hypoxia and hypotension requiring pressors.  Renal ultrasound without hydronephrosis and nephrology felt that AKI due to ATN from sepsis. He was started on CRRT due to azotemia with hyperkalemia. EEG done due to right gaze preference and showed evidence of moderate to severe diffuse encephalopathy.   ID following for input and he was started on HRT. He continued to have high fevers with leucocytosis and agitation therefore LP done which was negative for meningitis/CNS involvement. Bronchoscopy with tracheostomy performed by Dr. [place] Bells on 07/13 and he tolerated extubation and decannulation by 07/26 due to trach dislodgement.  Bronch cultures with MSSA/MRSE which was treated with Zyvox. HIV/AIDS has responded well to Dovato "after imperfect adherence with VDRF/ARF in setting of sepsis" per [facility]. To continue Dovato and 21 day course of Clindamycin and Primaquine as well as prednisone to treat PCP (PJP ag/PCR felt to be false negative).   Foley in place due eo recurrent urinary retention and Flomax added. Severe anemia treated with iron load and low dose aranesp. CRRT stopped and urine output improving. 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 Severe foot pain felt to be due to gout and he continues on oxycodone and steroids for pain management. On D3, honey liquids due to dysphagia with MBS for today showing improvement-->now  on regular diet.  Therapy ongoing and patient limited by severe pain in LLE, BLE weakness and working on pregait/standing attempts in [place]. CIR recommended due to functional decline. Patient transferred to CIR on  03/30/2021 .    Patient currently requires max with basic self-care skills secondary to muscle weakness, decreased cardiorespiratoy endurance, decreased coordination and decreased motor planning, decreased motor planning, decreased attention, decreased awareness, decreased problem solving, decreased safety awareness, decreased memory, and delayed processing, and decreased sitting balance, decreased standing balance, decreased postural control, and decreased balance strategies.  Prior to hospitalization, patient could complete BADL and IADL with independent .  Patient will benefit from skilled intervention to decrease level of assist with basic self-care skills and increase independence with basic self-care skills prior to discharge home independently with friends/family PRN  Anticipate patient will require intermittent supervision and follow up home health.  OT - End of Session Activity Tolerance: Tolerates 30+ min activity with multiple rests Endurance Deficit: Yes OT Assessment OT Barriers to Discharge: Decreased caregiver support;IV antibiotics OT Patient demonstrates impairments in the following area(s): Balance;Behavior;Cognition;Edema;Endurance;Motor;Pain;Safety OT Basic ADL's Functional Problem(s): Grooming;Bathing;Dressing;Toileting OT Transfers Functional Problem(s): Toilet;Tub/Shower OT Additional Impairment(s): None OT Plan OT Intensity: Minimum of 1-2 x/day, 45 to 90 minutes OT Frequency: 5 out of 7 days OT Duration/Estimated Length of Stay: 14-18 days OT Treatment/Interventions: Balance/vestibular training;DME/adaptive equipment instruction;Patient/family education;Therapeutic Activities;Wheelchair propulsion/positioning;Therapeutic Exercise;Psychosocial support;Functional electrical stimulation;Cognitive remediation/compensation;Functional mobility training;Community reintegration;Self Care/advanced ADL retraining;UE/LE Strength taining/ROM;UE/LE Coordination activities;Skin  care/wound managment;Neuromuscular re-education;Discharge planning;Disease mangement/prevention;Pain management;Visual/perceptual remediation/compensation OT Self Feeding Anticipated Outcome(s): no goal OT Basic Self-Care Anticipated Outcome(s): Supervision bathe/dress, Mod I toilet OT Toileting Anticipated Outcome(s): Mod I OT Bathroom Transfers Anticipated Outcome(s): Mod I toilet, tub/shower Supervision OT Recommendation Patient destination: Home Follow Up Recommendations: Home health OT Equipment Recommended: To be determined   OT Evaluation Precautions/Restrictions  Precautions Precautions: Fall Precaution Comments: healing trach site, fall, foley, LBP{ Restrictions Weight Bearing Restrictions: No Vital Signs Therapy Vitals Temp: 98.1 F (36.7 C) Temp Source: Oral Pulse Rate: 93 Resp: 18 BP: (!) 154/107 Patient Position (if appropriate): Sitting Oxygen Therapy SpO2: (!) 86 % O2 Device: Room Air Pain Pain Assessment Pain Scale: 0-10 Pain Score: 8  Home Living/Prior Functioning Home Living Family/patient expects to be discharged to:: Private residence Living Arrangements: Alone Available Help at Discharge: Family, Available PRN/intermittently Type of Home: Apartment Home Layout: One level Bathroom Shower/Tub: Multimedia programmer: Standard Additional Comments: girlfriend works as PCA but tries to be over at [employer] apartment "as much as I can." Plans to have toilet replaced with handicap height toilet  (per chart) IADL History Homemaking Responsibilities: Yes Current License: Yes Prior Function Level of Independence: Independent with basic ADLs, Independent with homemaking with ambulation, Independent with gait, Independent with transfers Driving: Yes Vision Baseline Vision/History: No visual deficits (reports wears glasses for long distance at times) Patient Visual Report: No change from baseline Perception  Perception: Within Functional  Limits Praxis Praxis: Impaired Praxis Impairment Details: Motor planning Cognition Overall Cognitive Status: Impaired/Different from baseline Arousal/Alertness: Awake/alert Orientation Level: Person;Place;Situation Person: Oriented Place: Oriented Situation: Oriented Year: 2022 Month: July Day of Week: Correct Memory: Impaired Memory Impairment: Retrieval deficit;Decreased short term memory Decreased Short Term Memory: Verbal complex;Functional complex Immediate Memory Recall: Sock;Bed;Blue Memory Recall Sock: Without Cue Memory Recall Blue: Without Cue Memory Recall Bed: Without Cue Attention: Alternating Alternating Attention Impairment: Verbal complex;Functional complex Executive Function: Self Correcting Self Correcting: Impaired Self Correcting Impairment: Verbal complex;Functional complex Safety/Judgment: Impaired (decreased safety) Sensation Sensation Light Touch:  Impaired by gross assessment (impaired BLEs in feet) Coordination Gross Motor Movements are Fluid and Coordinated: No Fine Motor Movements are Fluid and Coordinated: No Coordination and Movement Description: grossly uncoordinated d/t pain and deconditioning Motor  Motor Motor: Other (comment) Motor - Skilled Clinical Observations: grossly uncoordinated d/t deconditioning  Trunk/Postural Assessment  Cervical Assessment Cervical Assessment: Within Functional Limits Thoracic Assessment Thoracic Assessment: Exceptions to [facility] Lumbar Assessment Lumbar Assessment: Exceptions to [facility] (posterior pelvic tilt) Postural Control Postural Control: Deficits on evaluation  Balance Balance Balance Assessed: Yes Dynamic Sitting Balance Dynamic Sitting - Balance Support: Feet supported Dynamic Sitting - Level of Assistance: 4: Min assist Dynamic Sitting - Balance Activities: Lateral lean/weight shifting;Forward lean/weight shifting;Reaching for weighted objects Static Standing Balance Static Standing - Balance  Support: Bilateral upper extremity supported;During functional activity Static Standing - Level of Assistance: 3: Mod assist Dynamic Standing Balance Dynamic Standing - Balance Support: No upper extremity supported;Bilateral upper extremity supported Dynamic Standing - Level of Assistance: 3: Mod assist Dynamic Standing - Comments: during functional transfers Extremity/Trunk Assessment RUE Assessment RUE Assessment: Within Functional Limits General Strength Comments: overall deconditioning d/t prolonged hospitalization LUE Assessment LUE Assessment: Within Functional Limits General Strength Comments: overall deconditioning d/t prolonged hospitalization  Care Tool Care Tool Self Care Eating    Set up    Oral Care    Oral Care Assist Level: Set up assist    Bathing   Body parts bathed by patient: Right arm;Left arm;Chest;Abdomen;Right upper leg;Left upper leg;Face;Front perineal area Body parts bathed by helper: Buttocks;Right lower leg;Left lower leg   Assist Level: Maximal Assistance - Patient 24 - 49%    Upper Body Dressing(including orthotics)   What is the patient wearing?: Hospital gown only   Assist Level: Minimal Assistance - Patient > 75%    Lower Body Dressing (excluding footwear)      Max A    Putting on/Taking off footwear      Total A       Care Tool Toileting Toileting activity    Total A     Care Tool Bed Mobility Roll left and right activity    Min A    Sit to lying activity   Sit to lying assist level: Minimal Assistance - Patient > 75%    Lying to sitting edge of bed activity   Lying to sitting edge of bed assist level: Minimal Assistance - Patient > 75%     Care Tool Transfers Sit to stand transfer   Sit to stand assist level: Moderate Assistance - Patient 50 - 74%    Chair/bed transfer   Chair/bed transfer assist level: Maximal Assistance - Patient 25 - 49%     Toilet transfer    Max A     Care Tool Cognition Expression of Ideas  and Wants Expression of Ideas and Wants: Some difficulty - exhibits some difficulty with expressing needs and ideas (e.g, some words or finishing thoughts) or speech is not clear   Understanding Verbal and Non-Verbal Content Understanding Verbal and Non-Verbal Content: Understands (complex and basic) - clear comprehension without cues or repetitions   Memory/Recall Ability *first 3 days only Memory/Recall Ability *first 3 days only: Current season;That he or she is in a hospital/hospital unit    Refer to Care Plan for [place] 1 OT Short Term Goal 1 (Week 1): Pt will complete BSC/toilet transfer with LRAD with no more than Min A OT Short Term Goal 2 (Week 1):  Pt will perform LB dress with Mod A at sit > stand OT Short Term Goal 3 (Week 1): Pt will perform ADL routine with no more than Min cues for sequencing/problem solving OT Short Term Goal 4 (Week 1): Pt will complete ADL routine/activity of choice for 10 minutes without fatigue  Recommendations for other services: None    Skilled Therapeutic Intervention ADL ADL Eating: Not assessed Grooming: Setup Upper Body Bathing: Supervision/safety Lower Body Bathing: Maximal assistance Upper Body Dressing: Minimal assistance Lower Body Dressing: Maximal assistance Toileting: Maximal assistance Mobility  Transfers Sit to Stand: Maximal Assistance - Patient 25-49% Stand to Sit: Maximal Assistance - Patient 25-49%  Skilled Interventions: Pt greeted at time of session semireclined in bed agreeable to OT session/eval with encouragement. Initial part of session spent discussing role and purpose of OT and CIR.   Bed mobility performed throughout supine <> sit Min A overall and sit <> stands Mod/Max A at RW. Stand pivot transfers bed <> wheelchair Mod/Max A as well with heavy reliance on Ue's d/t gout in B feet. Pt set up at sink for oral hygiene and bathing, pt declining fully washing LB today so simulated and  Mod/Max to wash buttocks and past knees. Note IV running as well and therapist assist managing pole and Foley throughout. RN present d/t 8/10 low back pain, provided pain meds. Therapist also trialed STM for low back and pt stating this did help some. Donned new gown only with Min A before transferring back to bed. Note pt thtoughout session saying "let me do it" or "I dont need help." But educated that staff does need to help at this time. Reclined in bed alarm on call bell in reach.    Discharge Criteria: Patient will be discharged from OT if patient refuses treatment 3 consecutive times without medical reason, if treatment goals not met, if there is a change in medical status, if patient makes no progress towards goals or if patient is discharged from hospital.  The above assessment, treatment plan, treatment alternatives and goals were discussed and mutually agreed upon: by patient  [name] 03/31/2021, 2:00 PM

## 2021-03-31 NOTE — Plan of Care (Signed)
  Problem: RH Balance Goal: LTG Patient will maintain dynamic standing with ADLs (OT) Description: LTG:  Patient will maintain dynamic standing balance with assist during activities of daily living (OT)  Flowsheets (Taken 03/31/2021 1442) LTG: Pt will maintain dynamic standing balance during ADLs with: Independent with assistive device   Problem: Sit to Stand Goal: LTG:  Patient will perform sit to stand in prep for activites of daily living with assistance level (OT) Description: LTG:  Patient will perform sit to stand in prep for activites of daily living with assistance level (OT) Flowsheets (Taken 03/31/2021 1442) LTG: PT will perform sit to stand in prep for activites of daily living with assistance level: Independent with assistive device   Problem: RH Grooming Goal: LTG Patient will perform grooming w/assist,cues/equip (OT) Description: LTG: Patient will perform grooming with assist, with/without cues using equipment (OT) Flowsheets (Taken 03/31/2021 1442) LTG: Pt will perform grooming with assistance level of: Independent with assistive device    Problem: RH Bathing Goal: LTG Patient will bathe all body parts with assist levels (OT) Description: LTG: Patient will bathe all body parts with assist levels (OT) Flowsheets (Taken 03/31/2021 1442) LTG: Pt will perform bathing with assistance level/cueing: Supervision/Verbal cueing   Problem: RH Dressing Goal: LTG Patient will perform upper body dressing (OT) Description: LTG Patient will perform upper body dressing with assist, with/without cues (OT). Flowsheets (Taken 03/31/2021 1442) LTG: Pt will perform upper body dressing with assistance level of: Set up assist Goal: LTG Patient will perform lower body dressing w/assist (OT) Description: LTG: Patient will perform lower body dressing with assist, with/without cues in positioning using equipment (OT) Flowsheets (Taken 03/31/2021 1442) LTG: Pt will perform lower body dressing with  assistance level of: Supervision/Verbal cueing   Problem: RH Toileting Goal: LTG Patient will perform toileting task (3/3 steps) with assistance level (OT) Description: LTG: Patient will perform toileting task (3/3 steps) with assistance level (OT)  Flowsheets (Taken 03/31/2021 1442) LTG: Pt will perform toileting task (3/3 steps) with assistance level: Independent with assistive device   Problem: RH Simple Meal Prep Goal: LTG Patient will perform simple meal prep w/assist (OT) Description: LTG: Patient will perform simple meal prep with assistance, with/without cues (OT). Flowsheets (Taken 03/31/2021 1442) LTG: Pt will perform simple meal prep with assistance level of: Independent with assistive device   Problem: RH Toilet Transfers Goal: LTG Patient will perform toilet transfers w/assist (OT) Description: LTG: Patient will perform toilet transfers with assist, with/without cues using equipment (OT) Flowsheets (Taken 03/31/2021 1442) LTG: Pt will perform toilet transfers with assistance level of: Independent with assistive device   Problem: RH Tub/Shower Transfers Goal: LTG Patient will perform tub/shower transfers w/assist (OT) Description: LTG: Patient will perform tub/shower transfers with assist, with/without cues using equipment (OT) Flowsheets (Taken 03/31/2021 1442) LTG: Pt will perform tub/shower stall transfers with assistance level of: Supervision/Verbal cueing

## 2021-03-31 NOTE — Progress Notes (Signed)
   03/31/21 1324  Clinical Encounter Type  Visited With Patient  Visit Type Follow-up  Referral From Nurse  Consult/Referral To Chaplain   Chaplain facilitated notarization of HCPOA. Chaplain made copies and gave the original and copies to Pt.  Chaplain also put a copy in Pt's file.   This note was prepared by Chaplain Resident, Dante Gang, MDiv. Chaplain remains available as needed through the on-call pager: [phone].

## 2021-03-31 NOTE — Progress Notes (Signed)
[facility] Individual Statement of Services  Patient Name:  Bruce Hunter  Date:  03/31/2021  Welcome to the [facility].  Our goal is to provide you with an individualized program based on your diagnosis and situation, designed to meet your specific needs.  With this comprehensive rehabilitation program, you will be expected to participate in at least 3 hours of rehabilitation therapies Monday-Friday, with modified therapy programming on the weekends.  Your rehabilitation program will include the following services:  Physical Therapy (PT), Occupational Therapy (OT), 24 hour per day rehabilitation nursing, Therapeutic Recreaction (TR), Neuropsychology, Care Coordinator, Rehabilitation Medicine, Nutrition Services, and Pharmacy Services  Weekly team conferences will be held on Tuesday to discuss your progress.  Your Inpatient Rehabilitation Care Coordinator will talk with you frequently to get your input and to update you on team discussions.  Team conferences with you and your family in attendance may also be held.  Expected length of stay: 14-18 days  Overall anticipated outcome: supervision-independent level  Depending on your progress and recovery, your program may change. Your Inpatient Rehabilitation Care Coordinator will coordinate services and will keep you informed of any changes. Your Inpatient Rehabilitation Care Coordinator's name and contact numbers are listed  below.  The following services may also be recommended but are not provided by the [facility] will be made to provide these services after discharge if needed.  Arrangements include referral to agencies that provide these services.  Your insurance has been verified to be:  medicaid Your primary doctor is:  Gala Romney  Pertinent information will be  shared with your doctor and your insurance company.  Inpatient Rehabilitation Care Coordinator:  Ovidio Kin, [place] or Emilia Beck  Information discussed with and copy given to patient by: Elease Hashimoto, 03/31/2021, 2:07 PM

## 2021-03-31 NOTE — Progress Notes (Signed)
Inpatient Rehabilitation  Patient information reviewed and entered into eRehab system by Retia Cordle Jenavive Lamboy, OTR/L.   Information including medical coding, functional ability and quality indicators will be reviewed and updated through discharge.    

## 2021-03-31 NOTE — Progress Notes (Signed)
   03/31/21 1115  Clinical Encounter Type  Visited With Patient and family together  Visit Type Initial  Referral From Nurse  Consult/Referral To Chaplain   Chaplain responded to page and consult for AD assistance. Chaplain provided AD education and answered Pt and family's questions. HCPOA ready to be notarized. Chaplain will attempt to schedule notary appointment for this afternoon.   This note was prepared by Chaplain Resident, Dante Gang, MDiv. Chaplain remains available as needed through the on-call pager: [phone].
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
## 2021-03-31 NOTE — Evaluation (Signed)
Speech Language Pathology Assessment and Plan  Patient Details  Name: Bruce Hunter MRN: [id number] Date of Birth: 1976/08/15  SLP Diagnosis: Cognitive Impairments;Dysphagia  Rehab Potential: Excellent ELOS: 7-8 days    Today's Date: 03/31/2021 SLP Individual Time: 7681-1572 SLP Individual Time Calculation (min): 83 min   Hospital Problem: Principal Problem:   Intensive care (ICU) myopathy Active Problems:   AIDS (acquired immune deficiency syndrome) ([place])   Debility   Gout attack  Past Medical History:  Past Medical History:  Diagnosis Date   Asthma    Diabetes mellitus without complication ([place])    Gout    Hyperlipidemia    Hypertension    Obesity    Past Surgical History:  Past Surgical History:  Procedure Laterality Date   BIOPSY  02/15/2021   Procedure: BIOPSY;  Surgeon: Ronnette Juniper, MD;  [facility];  Service: Gastroenterology;;   COLONOSCOPY WITH PROPOFOL N/A 02/15/2021   Procedure: COLONOSCOPY WITH PROPOFOL;  Surgeon: Ronnette Juniper, MD;  [facility];  Service: Gastroenterology;  Laterality: N/A;   ESOPHAGOGASTRODUODENOSCOPY (EGD) WITH PROPOFOL N/A 02/15/2021   Procedure: ESOPHAGOGASTRODUODENOSCOPY (EGD) WITH PROPOFOL;  Surgeon: Ronnette Juniper, MD;  [facility];  Service: Gastroenterology;  Laterality: N/A;   GANGLION CYST EXCISION Right 02/26/2020   Procedure: EXCISION TOPHUS RIGHT FOOT;  Surgeon: Evelina Bucy, DPM;  Location: [facility];  Service: Podiatry;  Laterality: Right;   NO PAST SURGERIES      Assessment / Plan / Recommendation Clinical Impression  HPI: Bruce Hunter is a 45 year old male with history of HTN, T2DM with neuropathy,  gout, asthma, recent admission 02/12/21 with N/V, 60 lbs wt loss and new diagnosis of advanced HIV and candida esophagitis. He was discharged to home on 06/13 but readmitted on 03/04/21 with acute hypoxemic respiratory failure with hypotension, acute renal failure with  hyperkalemia and peripheral edema due to sepsis from bilateral pneumocystis pneumonia.  He was started on broad spectrum antibiotics and steroids for ARDs and ultimately required intubation on  07/01 due to high oxygen needs with progressive hypoxia and hypotension requiring pressors.  Renal ultrasound without hydronephrosis and nephrology felt that AKI due to ATN from sepsis. He was started on CRRT due to azotemia with hyperkalemia. EEG done due to right gaze preference and showed evidence of moderate to severe diffuse encephalopathy.   ID following for input and he was started on HRT. He continued to have high fevers with leucocytosis and agitation therefore LP done which was negative for meningitis/CNS involvement. Bronchoscopy with tracheostomy performed by Dr. Lake Bells on 07/13 and he tolerated extubation and decannulation by 07/26 due to trach dislodgement.  Bronch cultures with MSSA/MRSE which was treated with Zyvox. HIV/AIDS has responded well to Dovato "after imperfect adherence with VDRF/ARF in setting of sepsis" per [facility]. To continue Dovato and 21 day course of Clindamycin and Primaquine as well as prednisone to treat PCP (PJP ag/PCR felt to be false negative).   Foley in place due eo recurrent urinary retention and Flomax added. Severe anemia treated with iron load and low dose aranesp. CRRT stopped and urine output improving. 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 Severe foot pain felt to be due to gout and he continues on oxycodone and steroids for pain management. On D3, honey liquids due to dysphagia with MBS for today showing improvement-->now on regular diet.  Therapy ongoing and  patient limited by severe pain in LLE, BLE weakness and working on pregait/standing attempts in [place]. CIR recommended due to functional decline.   Pt presents with cognitive linguistic  deficits that are very mild and close to baseline. Pt  scored in average range for all subtests of Cognistat except for memory. He demonstrated slow processing and needed min A visual cues for complex problem solving. He reports noticeable change in memory and stated he will need help with sorting and understanding when to take new medications. Pt was recently decannulated however demonstrates adequate phonation, volume, and breath support for conversation. He was recently upgraded to regular diet and thin liquids and is tolerating well.  Pt will benefit from skilled SLP intervention during rehab stay.    Skilled Therapeutic Interventions          Skilled SLP evaluation completed. Plan of care and goasl reviewed with patient.   SLP Assessment  Patient will need skilled [facility] Services during CIR admission    Recommendations  Patient destination: Home Follow up Recommendations: None Equipment Recommended: None recommended by SLP    SLP Frequency 3 to 5 out of 7 days   SLP Duration  SLP Intensity  SLP Treatment/Interventions 7-8 days  Minumum of 1-2 x/day, 30 to 90 minutes  Cognitive remediation/compensation;Patient/family education;Therapeutic Activities    Pain Pain Assessment Pain Scale: Faces Pain Score: 10-Worst pain ever Faces Pain Scale: Hurts even more Pain Type: Acute pain Pain Location: Back Pain Descriptors / Indicators: Discomfort Pain Frequency: Constant Pain Onset: On-going Patients Stated Pain Goal: 5 Pain Intervention(s): Repositioned  Prior Functioning Cognitive/Linguistic Baseline: Within functional limits Type of Home: Apartment  SLP Evaluation Cognition Overall Cognitive Status: Impaired/Different from baseline Orientation Level: Oriented X4 Attention: Alternating Alternating Attention: Impaired Alternating Attention Impairment: Verbal complex;Functional complex Memory: Impaired Memory Impairment: Retrieval deficit;Decreased recall  of new information;Decreased short term memory Decreased Short Term Memory: Verbal complex;Functional complex Executive Function: Self Correcting Self Correcting: Impaired Self Correcting Impairment: Verbal complex;Functional complex  Comprehension Auditory Comprehension Overall Auditory Comprehension: Appears within functional limits for tasks assessed Expression Expression Primary Mode of Expression: Verbal Verbal Expression Overall Verbal Expression: Appears within functional limits for tasks assessed Oral Motor Oral Motor/Sensory Function Overall Oral Motor/Sensory Function: Within functional limits  Care Tool Care Tool Cognition Expression of Ideas and Wants Expression of Ideas and Wants: Some difficulty - exhibits some difficulty with expressing needs and ideas (e.g, some words or finishing thoughts) or speech is not clear   Understanding Verbal and Non-Verbal Content Understanding Verbal and Non-Verbal Content: Understands (complex and basic) - clear comprehension without cues or repetitions   Memory/Recall Ability *first 3 days only Memory/Recall Ability *first 3 days only: Current season;Location of own room;That he or she is in a hospital/hospital unit        Bedside Swallowing Assessment General Date of Onset: 03/04/21 Previous Swallow Assessment: MBS 7/26. Pt upgraded to regular diet with thin liquids. Respiratory Status: Room air Behavior/Cognition: Alert;Cooperative;Pleasant mood Oral Cavity - Dentition: Adequate natural dentition Vision: Functional for self-feeding Volitional Cough: Strong Volitional Swallow: Able to elicit  Oral Care Assessment   Ice Chips Ice chips: Within functional limits Presentation: Spoon Thin Liquid Thin Liquid: Within functional limits Presentation: Straw;Cup    Solid Solid: Within functional limits Presentation: Self Fed     Short Term Goals: Week 1: SLP Short Term Goal 1 (Week 1): Pt will t6olerate regular diet and  thin liquids with no overt s/sx of aspiration or penetration across 2  consecutive sessions SLP Short Term Goal 2 (Week 1): Pt will use external memory aids to increase recall of functional information with mod I SLP Short Term Goal 3 (Week 1): Pt will complete medication management and problerm solving tasks related to finances with Supervision  Refer to Care Plan for Long Term Goals  Recommendations for other services: None   Discharge Criteria: Patient will be discharged from SLP if patient refuses treatment 3 consecutive times without medical reason, if treatment goals not met, if there is a change in medical status, if patient makes no progress towards goals or if patient is discharged from hospital.  The above assessment, treatment plan, treatment alternatives and goals were discussed and mutually agreed upon: by patient  Gregary Signs A Breaunna Gottlieb 03/31/2021, 9:05 AM
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
## 2021-03-31 NOTE — Progress Notes (Signed)
Physical Therapy Assessment and Plan  Patient Details  Name: Bruce Hunter MRN: [id number] Date of Birth: 1976-02-13  PT Diagnosis: Abnormal posture, Abnormality of gait, Difficulty walking, Edema, Impaired sensation, Low back pain, Muscle weakness, and Pain in joint Rehab Potential: Good ELOS: 15-18 days   Today's Date: 03/31/2021 PT Individual Time: 0930-1030, 1500-1530 PT Individual Time Calculation (min): 30 min, 60 min   Hospital Problem: Principal Problem:   Intensive care (ICU) myopathy Active Problems:   AIDS (acquired immune deficiency syndrome) ([place])   Debility   Gout attack   Past Medical History:  Past Medical History:  Diagnosis Date   Asthma    Diabetes mellitus without complication ([place])    Gout    Hyperlipidemia    Hypertension    Obesity    Past Surgical History:  Past Surgical History:  Procedure Laterality Date   BIOPSY  02/15/2021   Procedure: BIOPSY;  Surgeon: Ronnette Juniper, MD;  Location: [facility];  Service: Gastroenterology;;   COLONOSCOPY WITH PROPOFOL N/A 02/15/2021   Procedure: COLONOSCOPY WITH PROPOFOL;  Surgeon: Ronnette Juniper, MD;  Location: [facility];  Service: Gastroenterology;  Laterality: N/A;   ESOPHAGOGASTRODUODENOSCOPY (EGD) WITH PROPOFOL N/A 02/15/2021   Procedure: ESOPHAGOGASTRODUODENOSCOPY (EGD) WITH PROPOFOL;  Surgeon: Ronnette Juniper, MD;  Location: [facility];  Service: Gastroenterology;  Laterality: N/A;   GANGLION CYST EXCISION Right 02/26/2020   Procedure: EXCISION TOPHUS RIGHT FOOT;  Surgeon: Evelina Bucy, DPM;  Location: [facility];  Service: Podiatry;  Laterality: Right;   NO PAST SURGERIES      Assessment & Plan Clinical Impression: Bruce Hunter is a 45 year old male with history of HTN, T2DM with neuropathy,  gout, asthma, recent admission 02/12/21 with N/V, 60 lbs wt loss and new diagnosis of advanced HIV and candida esophagitis. He was discharged to home on 06/13 but readmitted on 03/04/21 with acute  hypoxemic respiratory failure with hypotension, acute renal failure with hyperkalemia and peripheral edema due to sepsis from bilateral pneumocystis pneumonia.  He was started on broad spectrum antibiotics and steroids for ARDs and ultimately required intubation on  07/01 due to high oxygen needs with progressive hypoxia and hypotension requiring pressors.  Renal ultrasound without hydronephrosis and nephrology felt that AKI due to ATN from sepsis. He was started on CRRT due to azotemia with hyperkalemia. EEG done due to right gaze preference and showed evidence of moderate to severe diffuse encephalopathy.   ID following for input and he was started on HRT. He continued to have high fevers with leucocytosis and agitation therefore LP done which was negative for meningitis/CNS involvement. Bronchoscopy with tracheostomy performed by Dr. [place] Bells on 07/13 and he tolerated extubation and decannulation by 07/26 due to trach dislodgement.  Bronch cultures with MSSA/MRSE which was treated with Zyvox. HIV/AIDS has responded well to Dovato "after imperfect adherence with VDRF/ARF in setting of sepsis" per [facility]. To continue Dovato and 21 day course of Clindamycin and Primaquine as well as prednisone to treat PCP (PJP ag/PCR felt to be false negative).   Foley in place due eo recurrent urinary retention and Flomax added. Severe anemia treated with iron load and low dose aranesp. CRRT stopped and urine output improving. 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 Severe foot pain felt to be due to gout and he continues on oxycodone and steroids for pain management. On D3, honey liquids due to  dysphagia with MBS for today showing improvement-->now on regular diet.  Therapy ongoing and patient limited by severe pain in LLE, BLE weakness and working on pregait/standing attempts in [place].Patient transferred  to CIR on 03/30/2021 .   Patient currently requires max with mobility secondary to muscle weakness, decreased cardiorespiratoy endurance, and poor coordination .  Prior to hospitalization, patient was independent  with mobility and lived alone in a home. Pt is moving to an accessible apartment with level entry.  Patient will benefit from skilled PT intervention to maximize safe functional mobility, minimize fall risk, and decrease caregiver burden for planned discharge home with intermittent assist.  Anticipate patient will benefit from follow up [facility] at discharge.  PT - End of Session Activity Tolerance: Tolerates 10 - 20 min activity with multiple rests Endurance Deficit: Yes Endurance Deficit Description: Requires frequent rest breaks PT Assessment Rehab Potential (ACUTE/IP ONLY): Good PT Barriers to Discharge: [place] home environment;Decreased caregiver support;Wound Care;Lack of/limited family support;Behavior PT Plan PT Intensity: Minimum of 1-2 x/day ,45 to 90 minutes PT Frequency: 5 out of 7 days PT Duration Estimated Length of Stay: 15-18 days PT Treatment/Interventions: Ambulation/gait training;Balance/vestibular training;Cognitive remediation/compensation;Community reintegration;Discharge planning;Disease management/prevention;DME/adaptive equipment instruction;Functional mobility training;Neuromuscular re-education;Pain management;Psychosocial support;Patient/family education;Stair training;Therapeutic Activities;Therapeutic Exercise;UE/LE Strength taining/ROM;UE/LE Coordination activities;Wheelchair propulsion/positioning PT Transfers Anticipated Outcome(s): mod I PT Locomotion Anticipated Outcome(s): short distance mod I, long distance supervision PT Recommendation Recommendations for Other Services: Neuropsych consult;Therapeutic Recreation consult Therapeutic Recreation Interventions: Stress management Follow Up Recommendations: Other (comment) (TBD) Patient destination:  Home Equipment Recommended: To be determined   PT Evaluation Precautions/Restrictions Precautions Precautions: Fall Precaution Comments: healing trach site, fall, foley, LBP{ Restrictions Weight Bearing Restrictions: No General   Vital Signs Pain   Home Living/Prior Functioning Home Living Living Arrangements: Alone Available Help at Discharge: Family;Available PRN/intermittently Type of Home: Apartment Home Access: Level entry Home Layout: One level  Lives With: Alone Prior Function Level of Independence: Independent with gait;Independent with transfers  Able to Take Stairs?: Yes Driving: Yes Vocation: On disability Vision/Perception  Perception Perception: Within Functional Limits Praxis Praxis: Impaired Praxis Impairment Details: Motor planning  Cognition Overall Cognitive Status: Impaired/Different from baseline Arousal/Alertness: Awake/alert Orientation Level: Oriented X4 Attention: Alternating Alternating Attention: Impaired Alternating Attention Impairment: Verbal complex;Functional complex Memory: Impaired Memory Impairment: Retrieval deficit;Decreased short term memory Decreased Short Term Memory: Verbal complex;Functional complex Immediate Memory Recall: Sock;Bed;Blue Memory Recall Sock: Without Cue Memory Recall Blue: Without Cue Memory Recall Bed: Without Cue Awareness: Appears intact Problem Solving: Appears intact Behaviors: Restless;Impulsive;Poor frustration tolerance Safety/Judgment: Impaired Sensation Sensation Light Touch: Impaired by gross assessment Hot/Cold: Appears Intact Proprioception: Appears Intact Stereognosis: Appears Intact Coordination Gross Motor Movements are Fluid and Coordinated: No Fine Motor Movements are Fluid and Coordinated: No Coordination and Movement Description: grossly uncoordinated d/t pain and deconditioning Motor  Motor Motor: Other (comment) Motor - Skilled Clinical Observations: grossly uncoordinated  d/t deconditioning   Trunk/Postural Assessment  Cervical Assessment Cervical Assessment: Within Functional Limits Thoracic Assessment Thoracic Assessment: Within Functional Limits Lumbar Assessment Lumbar Assessment: Within Functional Limits Postural Control Postural Control: Deficits on evaluation  Balance Balance Balance Assessed: Yes Dynamic Sitting Balance Dynamic Sitting - Balance Support: Feet supported Dynamic Sitting - Level of Assistance: 4: Min assist Dynamic Sitting - Balance Activities: Lateral lean/weight shifting;Forward lean/weight shifting;Reaching for weighted objects Sitting balance - Comments: Initially assist due to posterior lean but then able to maintain static. Static Standing Balance Static Standing - Balance Support: Bilateral upper extremity supported;During functional activity Static Standing - Level of  Assistance: 3: Mod assist Dynamic Standing Balance Dynamic Standing - Balance Support: No upper extremity supported;Bilateral upper extremity supported Dynamic Standing - Level of Assistance: 3: Mod assist Dynamic Standing - Comments: during functional transfers Extremity Assessment  RUE Assessment RUE Assessment: Within Functional Limits General Strength Comments: overall deconditioning d/t prolonged hospitalization LUE Assessment LUE Assessment: Within Functional Limits General Strength Comments: overall deconditioning d/t prolonged hospitalization RLE Assessment RLE Assessment: Exceptions to [facility] (4/5) General Strength Comments: 4/5 LLE Assessment LLE Assessment: Exceptions to [facility] General Strength Comments: 4/5  Care Tool Care Tool Bed Mobility Roll left and right activity   Roll left and right assist level: Minimal Assistance - Patient > 75%    Sit to lying activity   Sit to lying assist level: Minimal Assistance - Patient > 75%    Lying to sitting edge of bed activity   Lying to sitting edge of bed assist level: Minimal Assistance -  Patient > 75%     Care Tool Transfers Sit to stand transfer   Sit to stand assist level: Moderate Assistance - Patient 50 - 74%    Chair/bed transfer   Chair/bed transfer assist level: Maximal Assistance - Patient 25 - 49%     Psychologist, counselling transfer activity did not occur: Safety/medical concerns        Care Tool Locomotion Ambulation   Assist level: Contact Guard/Touching assist Assistive device: Walker-rolling Max distance: 12 ft  Walk 10 feet activity   Assist level: Contact Guard/Touching assist     Walk 50 feet with 2 turns activity Walk 50 feet with 2 turns activity did not occur: Safety/medical concerns      Walk 150 feet activity Walk 150 feet activity did not occur: Safety/medical concerns      Walk 10 feet on uneven surfaces activity Walk 10 feet on uneven surfaces activity did not occur: Safety/medical concerns      Stairs Stair activity did not occur: Safety/medical concerns        Walk up/down 1 step activity Walk up/down 1 step or curb (drop down) activity did not occur: Safety/medical concerns     Walk up/down 4 steps activity did not occuR: Safety/medical concerns  Walk up/down 4 steps activity      Walk up/down 12 steps activity Walk up/down 12 steps activity did not occur: Safety/medical concerns      Pick up small objects from floor Pick up small object from the floor (from standing position) activity did not occur: Safety/medical concerns      Wheelchair Will patient use wheelchair at discharge?: Yes Type of Wheelchair: Manual Wheelchair activity did not occur: Refused (Pt stated he would not leave his room)      Wheel 50 feet with 2 turns activity Wheelchair 50 feet with 2 turns activity did not occur: Refused    Wheel 150 feet activity Wheelchair 150 feet activity did not occur: Refused      Refer to Care Plan for Long Term Goals  SHORT TERM GOAL WEEK 1 PT Short Term Goal 1 (Week 1): Pt will initate gait  training with LRAD PT Short Term Goal 2 (Week 1): Pt will tolerate x 30 minutes of activity PT Short Term Goal 3 (Week 1): Pt will tolerate sitting OOB for 30 + minutes  Recommendations for other services: Neuropsych and Therapeutic Recreation  Stress management  Skilled Therapeutic Intervention Mobility Bed Mobility Bed Mobility: Rolling Right;Rolling Left;Supine to Sit;Sit to Supine  Rolling Right: Contact Guard/Touching assist Rolling Left: Contact Guard/Touching assist Supine to Sit: Minimal Assistance - Patient > 75% Sit to Supine: Minimal Assistance - Patient > 75% Transfers Transfers: Sit to Stand;Stand to Sit;Stand Pivot Transfers Sit to Stand: Maximal Assistance - Patient 25-49% Stand to Sit: Maximal Assistance - Patient 25-49% Stand Pivot Transfers: Maximal Assistance - Patient 25 - 49% Stand Pivot Transfer Details: Verbal cues for technique;Verbal cues for precautions/safety;Verbal cues for safe use of DME/AE;Verbal cues for sequencing Transfer (Assistive device): Rolling walker Locomotion  Gait Gait Distance (Feet): 12 Feet Assistive device: Rolling walker Gait Gait Pattern: Impaired Gait velocity: decr Stairs / Additional Locomotion Stairs: No Wheelchair Mobility Wheelchair Mobility: No   Session 1: pt received in bed and agreeable to therapy. Pt complains of 11/10 pain in his shoulder, back, and ankle with no visible signs of distress. Nursing notified and pain addressed with rest breaks and positioning. Pt hesitant to attempt mobility but agreed to attempt evaluation. Bed mobility CGA with use of bed features, min A without. Strength and sensation testing completed while sitting EOB as documented above. Sit to stand with RW mod A for power up to stand. Stand pivot transfer bed<>chair max A with posterior LOB. Pt c/o pain in feet and ankles with weightbearing, likely related to gout and edema. Pt returned to bed and was left with bed alarm active and all needs in reach.  Pt easily distractible and often resistant to mobility.  Session 2: Pt greeted seated EOB, no immediate c/o pain. Pt initially resistant to therapy, but ultimately agreeable. Pt attempted to perform as little mobility as possible throughout session, stating he was fatigued from previous sessions. Pt performed Sit to stand to RW x 5 with CGA. Pt then ambulated to/from bathroom sink, c/o pain in feet with weight bearing. Stand pivot transfer EOB<>w/c with RW and CGA. Pt had episode of agitation and requested klonopin. Consulted nsg, who reported he would receive his scheduled dose at 10 pm. Attempted diaphragmatic breathing for alternative anxiety management. Pt was resistant and stated he would rather return to exercise. Pt left in room with bed alarm set. All needs in reach.   Discharge Criteria: Patient will be discharged from PT if patient refuses treatment 3 consecutive times without medical reason, if treatment goals not met, if there is a change in medical status, if patient makes no progress towards goals or if patient is discharged from hospital.  The above assessment, treatment plan, treatment alternatives and goals were discussed and mutually agreed upon: by patient  Mickel Fuchs 03/31/2021, 5:32 PM

## 2021-03-31 NOTE — Progress Notes (Signed)
Initial Nutrition Assessment  DOCUMENTATION CODES:  Not applicable  INTERVENTION:  Add 30 ml ProSource Plus po TID, each supplement provides 100 kcal and 15 grams of protein.    Add Magic cup TID with meals, each supplement provides 290 kcal and 9 grams of protein.  Continue MVI with minerals daily.  NUTRITION DIAGNOSIS:  Increased nutrient needs related to chronic illness (HIV) as evidenced by estimated needs.  GOAL:  Patient will meet greater than or equal to 90% of their needs  MONITOR:  PO intake, Supplement acceptance, Labs, Weight trends, Skin, I & O's  REASON FOR ASSESSMENT:  Malnutrition Screening Tool    ASSESSMENT:  45 year-old male with medical history of recently diagnosed HIV/AIDS and non-compliant with HAART, DM, asthma, HLD, HTN, and gout who presents with ICU myopathy.  Per Epic, pt ate 100% of dinner last night and 85% of his breakfast this morning. Pt's appetite a bit better with regular textured food.  Pt with weight loss throughout hospital stay. Per Epic, pt has lost ~19 lbs (9.1%) in 2 months which is significant and severe for the time frame.  On exam, pt with few mild depletions.  RD to order Magic Cup TID and ProSource Plus TID. Continue MVI with minerals daily.  Medications: reviewed; SSI, bedtime Novolog, Semglee, MVI with minerals, prednisone, TUMS TID, oxycodone PRN (given 4 times today)  Labs: reviewed; Na 132 (L), CBG 94-159 HbA1c: 5.4% (03/2021)  NUTRITION - FOCUSED PHYSICAL EXAM: Flowsheet Row Most Recent Value  Orbital Region Mild depletion  Upper Arm Region No depletion  Thoracic and Lumbar Region No depletion  Buccal Region Mild depletion  Temple Region Mild depletion  Clavicle Bone Region No depletion  Clavicle and Acromion Bone Region No depletion  Scapular Bone Region No depletion  Dorsal Hand Mild depletion  Patellar Region No depletion  Anterior Thigh Region No depletion  Posterior Calf Region No depletion  Edema (RD  Assessment) Mild  [Generalized, BLE]  Hair Reviewed  Eyes Reviewed  Mouth Reviewed  Skin Reviewed  Nails Reviewed   Diet Order:   Diet Order             Diet Carb Modified Fluid consistency: Thin; Room service appropriate? Yes  Diet effective now                  EDUCATION NEEDS:  Education needs have been addressed  Skin:  Skin Assessment: Skin Integrity Issues: Skin Integrity Issues:: Stage II, Other (Comment) Stage II: L & R buttocks, sacrum Other: MASD coccyx; MARSI L face from ETT holder  Last BM:  03/30/21 - Type 5, medium  Height:  Ht Readings from Last 1 Encounters:  03/30/21 '5\' 10"'$  (1.778 m)   Weight:  Wt Readings from Last 1 Encounters:  03/30/21 84.1 kg   BMI:  Body mass index is 26.6 kg/m.  Estimated Nutritional Needs:  Kcal:  2400-2600 Protein:  130-145 grams Fluid:  >2.4 L  Derrel Nip, RD, LDN (she/her/hers) Registered Dietitian I After-Hours/Weekend Pager # in [phone]

## 2021-04-01 DIAGNOSIS — B20 Human immunodeficiency virus [HIV] disease: Secondary | ICD-10-CM | POA: Diagnosis not present

## 2021-04-01 DIAGNOSIS — M109 Gout, unspecified: Secondary | ICD-10-CM | POA: Diagnosis not present

## 2021-04-01 DIAGNOSIS — G7281 Critical illness myopathy: Secondary | ICD-10-CM | POA: Diagnosis not present

## 2021-04-01 LAB — GLUCOSE, CAPILLARY
Glucose-Capillary: 155 mg/dL — ABNORMAL HIGH (ref 70–99)
Glucose-Capillary: 129 mg/dL — ABNORMAL HIGH (ref 70–99)
Glucose-Capillary: 171 mg/dL — ABNORMAL HIGH (ref 70–99)
Glucose-Capillary: 139 mg/dL — ABNORMAL HIGH (ref 70–99)

## 2021-04-01 LAB — COMPREHENSIVE METABOLIC PANEL
ALT: 9 U/L (ref 0–44)
AST: 17 U/L (ref 15–41)
Albumin: 2.8 g/dL — ABNORMAL LOW (ref 3.5–5.0)
Alkaline Phosphatase: 64 U/L (ref 38–126)
Anion gap: 12 (ref 5–15)
BUN: 19 mg/dL (ref 6–20)
CO2: 24 mmol/L (ref 22–32)
Calcium: 9.6 mg/dL (ref 8.9–10.3)
Chloride: 94 mmol/L — ABNORMAL LOW (ref 98–111)
Creatinine, Ser: 1.39 mg/dL — ABNORMAL HIGH (ref 0.61–1.24)
GFR, Estimated: >60 mL/min (ref >60)
Glucose, Bld: 147 mg/dL — ABNORMAL HIGH (ref 70–99)
Potassium: 3.7 mmol/L (ref 3.5–5.1)
Sodium: 130 mmol/L — ABNORMAL LOW (ref 135–145)
Total Bilirubin: 0.2 mg/dL — ABNORMAL LOW (ref 0.3–1.2)
Total Protein: 8.1 g/dL (ref 6.5–8.1)

## 2021-04-01 LAB — MAGNESIUM: Magnesium: 1.4 mg/dL — ABNORMAL LOW (ref 1.7–2.4)

## 2021-04-01 MED ORDER — PANTOPRAZOLE SODIUM 40 MG PO TBEC
40.0000 mg | DELAYED_RELEASE_TABLET | Freq: Every day | ORAL | Status: DC
  Administered 2021-04-01 – 2021-05-03 (×33): 40 mg via ORAL
  Filled 2021-04-01 (×35): qty 1

## 2021-04-01 MED ORDER — TAMSULOSIN HCL 0.4 MG PO CAPS
0.4000 mg | ORAL_CAPSULE | Freq: Every day | ORAL | Status: DC
  Administered 2021-04-01 – 2021-05-02 (×32): 0.4 mg via ORAL
  Filled 2021-04-01 (×33): qty 1

## 2021-04-01 MED ORDER — MAGNESIUM OXIDE -MG SUPPLEMENT 400 (240 MG) MG PO TABS
400.0000 mg | ORAL_TABLET | Freq: Two times a day (BID) | ORAL | Status: DC
  Administered 2021-04-01 – 2021-05-03 (×64): 400 mg via ORAL
  Filled 2021-04-01 (×66): qty 1

## 2021-04-01 NOTE — Progress Notes (Signed)
Occupational Therapy Session Note  Patient Details  Name: Bruce Hunter MRN: [id number] Date of Birth: 02/18/1976  Today's Date: 04/01/2021 OT Individual Time: [id number] Individual Time Calculation (min): 46 min    Short Term Goals: Week 1:  OT Short Term Goal 1 (Week 1): Pt will complete BSC/toilet transfer with LRAD with no more than Min A OT Short Term Goal 2 (Week 1): Pt will perform LB dress with Mod A at sit > stand OT Short Term Goal 3 (Week 1): Pt will perform ADL routine with no more than Min cues for sequencing/problem solving OT Short Term Goal 4 (Week 1): Pt will complete ADL routine/activity of choice for 10 minutes without fatigue  Skilled Therapeutic Interventions/Progress Updates:    Pt greeted at time of session sitting up in wheelchair hand off from PT, focus of session on wheelchair mobility for self care tasks and BUE strengthening, able to self propel Supervision except for some tight turns. Self propel room > ADL apartment > ortho gym > room, and focus in ADL apartment with IADL retraining for accessing cabinets/shelving with CGA/close supervision using RW to retrieve items, able to place one UE on counter for support for safety. Discussion throughout session for coping mechanisms for pt's reported ADD/past trauma but easily distracted, needed Mod/max cues throughout session to attend to tasks. Dynamic standing at BITS 2 rounds for 2-2:45 minutes each for dynamic reaching/crossing midline and improving standing balance. Minimal reports of foot pain today saying that gout pain significantly improved. Back in room with alarm on call bell in reach.   Therapy Documentation Precautions:  Precautions Precautions: Fall Precaution Comments: healing trach site, fall, foley, LBP{ Restrictions Weight Bearing Restrictions: No     Therapy/Group: Individual Therapy  [name] 04/01/2021, 1:38 PM
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
## 2021-04-01 NOTE — Progress Notes (Signed)
Speech Language Pathology Daily Session Note  Patient Details  Name: CALI GOLSON MRN: [id number] Date of Birth: Dec 19, 1975  Today's Date: 04/01/2021 SLP Individual Time: 1100-1130 SLP Individual Time Calculation (min): 30 min  Short Term Goals: Week 1: SLP Short Term Goal 1 (Week 1): Pt will t6olerate regular diet and thin liquids with no overt s/sx of aspiration or penetration across 2 consecutive sessions SLP Short Term Goal 2 (Week 1): Pt will use external memory aids to increase recall of functional information with mod I SLP Short Term Goal 3 (Week 1): Pt will complete medication management and problerm solving tasks related to finances with Supervision  Skilled Therapeutic Interventions:   Patient seen for skilled ST session focusing on cognitive function goals. Patient sitting up in [facility] and visitor in room left when session started. Patient receptive to therapy but very talkative and had difficulty maintaining topic and fully attending. He did complete delayed recall task after reading short story with supervision to minA overall and patient exhibiting some instances of self-correction. Patient very open in talking about himself and his life with SLP, however when SLP later discussed this with PT who has worked with patient, it seems that specific information he is giving is not consistent.  He was able to recall and describe specific activities and exercises completed during recent therapy sessions when using therapy schedule as a guide. Patient continues to benefit from skilled SLP intervention to maximize cognitive and swallow function goals prior to discharge. Pain Pain Assessment Pain Scale: 0-10 Pain Score: 0-No pain  Therapy/Group: Individual Therapy  Sonia Baller, MA, CCC-SLP Speech Therapy

## 2021-04-01 NOTE — Progress Notes (Signed)
Physical Therapy Session Note  Patient Details  Name: Bruce Hunter MRN: [id number] Date of Birth: 1976-03-22  Today's Date: 04/01/2021 PT Individual Time:0800-0900, 1130-1200 PT Individual Time Calculation (min): 30 min 60 min  Short Term Goals: Week 1:  PT Short Term Goal 1 (Week 1): Pt will initate gait training with LRAD PT Short Term Goal 2 (Week 1): Pt will tolerate x 30 minutes of activity PT Short Term Goal 3 (Week 1): Pt will tolerate sitting OOB for 30 + minutes  Skilled Therapeutic Interventions/Progress Updates:    Session 1: pt received in bed and agreeable to therapy. Pt was sleeping on arrival, but roused with encouragement. Pt reported had slept well and had no immediate c/o pain. Bed mobility supervision with use of bed features. Nursing provided morning meds pass with pt sitting EOB. Pt donned paper scrubs with supervision and increased time. Pt then ambulated to w/c with CGA  and RW. Pt propelled w/c with BUE x 150 ft to therapy gym. Pt navigated 3" stairs x 8 with CGA and BUE support. Pt then navigated 6" stairs with BUE support and CGA x 4. Pt demoed mild coordination deficits when placing feet on stair. Pt then propelled w/c with BUE and BLE to retrieve ball, when asked what he was doing, pt stated "I'm getting a ball so I can dunk it." Pt participated in 10 minutes of w/c basket ball, navigating w/c with BUE and BLE, picking up ball from floor, throwing and catching ball to improve endurance and increase sitting balance. Noted improvement in pt mood after playing basket ball. Pt then ambulated x 50 ft with RW and CGA, demoed hip and trunk flexion and occ catching R toes during swing. Pt propelled w/c with BUE x 150 ft and x 60 ft back to room. Pt remained in room with belt alarm active and all needs in reach.   Session 2: Pt seated in w/c on arrival and agreeable to therapy. No immediate c/o pain. Pt requested to use the bathroom before leaving room. ambulatory transfer to  bathroom CGA with RW. Sit<> stand on toilet with CGA, independent pericare. Pt stood at sink for hand hygiene with supervision. Gait with RW and CGA 2 x 60 ft to/from therapy gym. Pt stated his feet were hurting but continued to walk in the same manner. Pt demoed occ foot catching with fatigue, trunk flexion, poor foot clearance. Pt performed squats x 10 with CGA. Pt returned to room and was left seated EOB with all needs in reach and bed alarm active.   Therapy Documentation Precautions:  Precautions Precautions: Fall Precaution Comments: healing trach site, fall, foley, LBP{ Restrictions Weight Bearing Restrictions: No     Therapy/Group: Individual Therapy  Mickel Fuchs 04/01/2021, 12:16 PM

## 2021-04-01 NOTE — Progress Notes (Signed)
Physical Therapy Session Note  Patient Details  Name: Bruce Hunter MRN: [id number] Date of Birth: 02-28-1976  Today's Date: 04/01/2021 PT Individual Time: [id number] PT Individual Time Calculation (min): 35 min   Short Term Goals: Week 1:  PT Short Term Goal 1 (Week 1): Pt will initate gait training with LRAD PT Short Term Goal 2 (Week 1): Pt will tolerate x 30 minutes of activity PT Short Term Goal 3 (Week 1): Pt will tolerate sitting OOB for 30 + minutes  Skilled Therapeutic Interventions/Progress Updates:    Patient in supine and reports fatigued from doing a lot of walking earlier.  Reports ankles swell a lot and hurt.  Patient performed supine to sit with S.  Donned shoes seated EOB with min A.  Performed sit to stand with CGA.  Patient ambulated with RW and w/c close for safety x 150'.  Fatigued with pain in feet and SOB, but SpO2 100%, HR 123.  Patient performed sit<>stand x 5 from EOM to RW with CGA and cues to slowly sit.  Performed seated marching x 15, ankle DF/PF x 25, moving weighted blue ball in and out x 10 with cues for posture and from hip to shoulder for core strength x 15 each direction.  Patient requesting to shoot basketball from w/c.  Patient transferred to w/c with squat pivot with CGA. Shooting baskets first with lightweight multipurpose ball, then after filling basketball with regular basketball pt maneuvering w/c to chase and retrieve ball reaching to floor from w/c with S for trunk strength and working UE's to shoot and catch ball.  Patient propelled in w/c over 200' in gym then to return to room with S.  Left seated in w/c in prep for next session with OT with need in reach, OT in hallway.  Therapy Documentation Precautions:  Precautions Precautions: Fall Precaution Comments: healing trach site, fall, foley, LBP{ Restrictions Weight Bearing Restrictions: No  Pain: Pain Assessment Pain Scale: 0-10 Pain Score: 0-No pain Faces Pain Scale: Hurts whole lot Pain  Type: Acute pain Pain Location: Ankle Pain Orientation: Right;Left Pain Descriptors / Indicators: Aching;Burning Pain Onset: With Activity Pain Intervention(s): Repositioned    Therapy/Group: Individual Therapy  Reginia Naas Magda Kiel, PT 04/01/2021, 4:58 PM

## 2021-04-02 DIAGNOSIS — G7281 Critical illness myopathy: Secondary | ICD-10-CM | POA: Diagnosis not present

## 2021-04-02 LAB — GLUCOSE, CAPILLARY
Glucose-Capillary: 156 mg/dL — ABNORMAL HIGH (ref 70–99)
Glucose-Capillary: 185 mg/dL — ABNORMAL HIGH (ref 70–99)
Glucose-Capillary: 200 mg/dL — ABNORMAL HIGH (ref 70–99)
Glucose-Capillary: 196 mg/dL — ABNORMAL HIGH (ref 70–99)

## 2021-04-02 NOTE — Progress Notes (Signed)
Occupational Therapy Session Note  Patient Details  Name: Bruce Hunter MRN: [id number] Date of Birth: 04/22/1976  Today's Date: 04/02/2021 OT Individual Time: 1020-1130 OT Individual Time Calculation (min): 70 min    Short Term Goals: Week 1:  OT Short Term Goal 1 (Week 1): Pt will complete BSC/toilet transfer with LRAD with no more than Min A OT Short Term Goal 2 (Week 1): Pt will perform LB dress with Mod A at sit > stand OT Short Term Goal 3 (Week 1): Pt will perform ADL routine with no more than Min cues for sequencing/problem solving OT Short Term Goal 4 (Week 1): Pt will complete ADL routine/activity of choice for 10 minutes without fatigue  Skilled Therapeutic Interventions/Progress Updates:    Pt received in bed sleeping but awoke easily.  Pt stated he was "starving"  so brought him some Esch crackers to snack on. Pt very talkative this session and frequently asked me to retrieve things for him (ice, new socks, new pants, etc)  at different intervals so the session moved a bit slower than it should have. Pt stated he was very determined to be independent again and wanted to show me what he could do on his own. Besides some safety cues (locking breaks), sequencing tasks (ie how to feed urinal bag through pants), pt was able to bathe, dress and groom with supervision.  Pt has a good sense of humor and made lots of jokes.   Pt did well with standing and ambulating from bed to sink with RW.  Pt resting in w/c with all needs met, belt alarm off.   Therapy Documentation Precautions:  Precautions Precautions: Fall Precaution Comments: healing trach site, fall, foley, LBP{ Restrictions Weight Bearing Restrictions: No     Pain: No c/o pain during therapy session.   ADL: ADL Eating: Not assessed Grooming: Setup Upper Body Bathing: Supervision/safety Lower Body Bathing: Maximal assistance Upper Body Dressing: Minimal assistance Lower Body Dressing: Maximal  assistance Toileting: Maximal assistance  Therapy/Group: Individual Therapy  [place] 04/02/2021, 8:30 AM

## 2021-04-02 NOTE — Progress Notes (Signed)
Speech Language Pathology Daily Session Note  Patient Details  Name: Bruce Hunter MRN: [id number] Date of Birth: 20-Apr-1976  Today's Date: 04/02/2021 SLP Individual Time: 1430-1500 SLP Individual Time Calculation (min): 30 min  Short Term Goals: Week 1: SLP Short Term Goal 1 (Week 1): Pt will tolerate regular diet and thin liquids with no overt s/sx of aspiration or penetration across 2 consecutive sessions SLP Short Term Goal 2 (Week 1): Pt will use external memory aids to increase recall of functional information with mod I SLP Short Term Goal 3 (Week 1): Pt will complete medication management and problerm solving tasks related to finances with Supervision  Skilled Therapeutic Interventions: Skilled treatment session focused on dysphagia and cognitive goals. SLP facilitated session by providing a snack of regular textures (fruit) and thin liquids. Patient consumed large bites and demonstrated efficient mastication with complete oral clearance without overt s/s of aspiration. Recommend patient continue current diet and f/u is no longer warranted for dysphagia. SLP also provided the names of patient's current medications and patient independently recalled the functions of 10/11 pills. Throughout session, patient was verbose and required Min verbal cues for sustained attention to tasks. Patient left upright in wheelchair with alarm on and all needs within reach. Continue with current plan of care.      Pain No reports of pain  Therapy/Group: Individual Therapy  Brannon Levene 04/02/2021, 3:05 PM

## 2021-04-02 NOTE — Progress Notes (Signed)
Physical Therapy Session Note  Patient Details  Name: Bruce Hunter MRN: [id number] Date of Birth: Jan 30, 1976  Today's Date: 04/02/2021 PT Individual Time:0800-0900, [id number] PT Individual Time Calculation (min): 60 min, 35 min   Short Term Goals: Week 1:  PT Short Term Goal 1 (Week 1): Pt will initate gait training with LRAD PT Short Term Goal 2 (Week 1): Pt will tolerate x 30 minutes of activity PT Short Term Goal 3 (Week 1): Pt will tolerate sitting OOB for 30 + minutes  Skilled Therapeutic Interventions/Progress Updates:    Session 1: pt received sitting edge of bed and agreeable to therapy. Pt presented with blanket over his head and reported feeling chilled all session. ambulatory transfer to w/c with close supervision. Pt c/o pain in ankles and back intermittently throughout session, but mobility was not limited by pain and pt refused soft tissue mobilization and ice as alternative pain modalities. ambulatory transfer to nustep, 15 min at level 1 with alternating 40-60 spm. Pt repeatedly stating he could do more but would not. Gait 2 x 150 ft with cueing for "shoulders up, hips forward" to encourage decreased trunk flexion and improved glute activation during gait. W/c mobility 2 x 300 ft to/from dayroom with supervision, including management of leg rests and brakes. Pt returned to room after session and was left with all needs in reach and alarm active.   Session 2: Pt seated in w/c on arrival and agreeable to therapy. No complaint of pain. Pt's mother present on arrival, departed during session. Pt requested to stay in room, seemingly to do less activity. Performed STS 2 x 15 with supervision. Pt completed slowly and requested to use the bathroom between sets. ambulatory transfer to bathroom, supervision clothing management, independent pericare. Pt used extended time for all mobility. Pt remained in w/c after session with belt alarm active and all needs in reach.   Therapy  Documentation Precautions:  Precautions Precautions: Fall Precaution Comments: healing trach site, fall, foley, LBP{ Restrictions Weight Bearing Restrictions: No     Therapy/Group: Individual Therapy  Mickel Fuchs 04/02/2021, 5:01 PM

## 2021-04-02 NOTE — IPOC Note (Signed)
Overall Plan of Care [facility]) Patient Details Name: Bruce Hunter MRN: [id number] DOB: 24-Oct-1975  Admitting Diagnosis: Intensive care (ICU) myopathy  Hospital Problems: Principal Problem:   Intensive care (ICU) myopathy Active Problems:   AIDS (acquired immune deficiency syndrome) ([place])   Debility   Gout attack     Functional Problem List: Nursing Behavior, Bladder, Endurance, Medication Management, Nutrition, Pain, Perception, Safety, Skin Integrity, Edema  PT    OT Balance, Behavior, Cognition, Edema, Endurance, Motor, Pain, Safety  SLP Cognition, Safety  TR         Basic ADL's: OT Grooming, Bathing, Dressing, Toileting     Advanced  ADL's: OT       Transfers: PT    OT Toilet, Tub/Shower     Locomotion: PT       Additional Impairments: OT None  SLP Social Cognition, Interior and spatial designer, Attention, Awareness  TR      Anticipated Outcomes Item Anticipated Outcome  Self Feeding no goal  Swallowing  Independent   Basic self-care  Supervision bathe/dress, Mod I toilet  Toileting  Mod I   Bathroom Transfers Mod I toilet, tub/shower Supervision  Bowel/Bladder  Supervision with bladder  Transfers  mod I  Locomotion  short distance mod I, long distance supervision  Communication     Cognition  mod I  Pain  <4  Safety/Judgment  Supervision and no falls   Therapy Plan: PT Intensity: Minimum of 1-2 x/day ,45 to 90 minutes PT Frequency: 5 out of 7 days PT Duration Estimated Length of Stay: 15-18 days OT Intensity: Minimum of 1-2 x/day, 45 to 90 minutes OT Frequency: 5 out of 7 days OT Duration/Estimated Length of Stay: 14-18 days SLP Intensity: Minumum of 1-2 x/day, 30 to 90 minutes SLP Frequency: 3 to 5 out of 7 days SLP Duration/Estimated Length of Stay: 7-8 days   Due to the current state of emergency, patients may not be receiving their 3-hours of Medicare-mandated therapy.   Team Interventions: Nursing Interventions Patient/Family  Education, Bladder Management, Disease Management/Prevention, Pain Management, Medication Management, Skin Care/Wound Management, Discharge Planning, Psychosocial Support  PT interventions Ambulation/gait training, Training and development officer, Cognitive remediation/compensation, Community reintegration, Discharge planning, Disease management/prevention, DME/adaptive equipment instruction, Functional mobility training, Neuromuscular re-education, Pain management, Psychosocial support, Patient/family education, Stair training, Therapeutic Activities, Therapeutic Exercise, UE/LE Strength taining/ROM, UE/LE Coordination activities, Wheelchair propulsion/positioning  OT Interventions Training and development officer, Engineer, drilling, Patient/family education, Therapeutic Activities, Wheelchair propulsion/positioning, Therapeutic Exercise, Psychosocial support, Functional electrical stimulation, Cognitive remediation/compensation, Functional mobility training, Community reintegration, Self Care/advanced ADL retraining, UE/LE Strength taining/ROM, UE/LE Coordination activities, Skin care/wound managment, Neuromuscular re-education, Discharge planning, Disease mangement/prevention, Pain management, Visual/perceptual remediation/compensation  SLP Interventions Cognitive remediation/compensation, Patient/family education, Therapeutic Activities  TR Interventions    SW/CM Interventions Discharge Planning, Psychosocial Support, Patient/Family Education   Barriers to Discharge MD  Medical stability, Home enviroment access/loayout, Incontinence, Wound care, Lack of/limited family support, Medication compliance, and Behavior  Nursing Decreased caregiver support, Home environment access/layout, Incontinence, Wound Care, Lack of/limited family support, Weight, Medication compliance, Behavior, Nutrition means Patient lives alone in an apartment with 2 stairs to enter and no rails. Has 2 columns that he can use  for support. Patient has a girlfriend, mother, and other family/friends that can provide intermittent assist but no physical care.  PT Inaccessible home environment, Decreased caregiver support, Wound Care, Lack of/limited family support, Behavior    OT Decreased caregiver support, IV antibiotics    SLP      SW Decreased caregiver  support, Medication compliance, Insurance for SNF coverage     Team Discharge Planning: Destination: PT-Home ,OT- Home , SLP-Home Projected Follow-up: PT-Other (comment) (TBD), OT-  Home health OT, SLP-None Projected Equipment Needs: PT-To be determined, OT- To be determined, SLP-None recommended by SLP Equipment Details: PT- , OT-  Patient/family involved in discharge planning: PT- Patient,  OT-Patient, SLP-Patient  MD ELOS: 15-18 days Medical Rehab Prognosis:  Fair Assessment: Pt is a 44 yr old male with 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     See Team Conference Notes for weekly updates to the plan of care
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
## 2021-04-03 DIAGNOSIS — G7281 Critical illness myopathy: Secondary | ICD-10-CM | POA: Diagnosis not present

## 2021-04-03 LAB — GLUCOSE, CAPILLARY
Glucose-Capillary: 167 mg/dL — ABNORMAL HIGH (ref 70–99)
Glucose-Capillary: 140 mg/dL — ABNORMAL HIGH (ref 70–99)
Glucose-Capillary: 212 mg/dL — ABNORMAL HIGH (ref 70–99)
Glucose-Capillary: 97 mg/dL (ref 70–99)

## 2021-04-03 LAB — CULTURE, FUNGUS WITHOUT SMEAR

## 2021-04-03 MED ORDER — LIDOCAINE 5 % EX PTCH
1.0000 | MEDICATED_PATCH | CUTANEOUS | Status: DC
  Administered 2021-04-03 – 2021-04-04 (×2): 1 via TRANSDERMAL
  Filled 2021-04-03 (×2): qty 1

## 2021-04-03 MED ORDER — DICLOFENAC SODIUM 1 % EX GEL
2.0000 g | Freq: Four times a day (QID) | CUTANEOUS | Status: DC
  Administered 2021-04-03 – 2021-04-30 (×63): 2 g via TOPICAL
  Filled 2021-04-03 (×2): qty 100
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
## 2021-04-03 NOTE — Progress Notes (Addendum)
Patient repeatedly calling and asking for several packets of [place] crackers. Educated patient on importance of being compliant with diet and sugar ranges. Patient states they understand, but still wants multiple packets of [place] crackers and ice cream. Patient given one packet of Langi crackers with lunch. Upon delivering lunch tray; patient also ordered cake, chocolate ice cream and sodas.

## 2021-04-04 LAB — GLUCOSE, CAPILLARY
Glucose-Capillary: 104 mg/dL — ABNORMAL HIGH (ref 70–99)
Glucose-Capillary: 201 mg/dL — ABNORMAL HIGH (ref 70–99)
Glucose-Capillary: 142 mg/dL — ABNORMAL HIGH (ref 70–99)
Glucose-Capillary: 163 mg/dL — ABNORMAL HIGH (ref 70–99)

## 2021-04-04 NOTE — Progress Notes (Signed)
Speech Language Pathology Daily Session Note  Patient Details  Name: JAQUA MEAR MRN: [id number] Date of Birth: 1976-05-30  Today's Date: 04/04/2021 SLP Individual Time: 1435-1500 SLP Individual Time Calculation (min): 25 min  Short Term Goals: Week 1: SLP Short Term Goal 1 (Week 1): Pt will t6olerate regular diet and thin liquids with no overt s/sx of aspiration or penetration across 2 consecutive sessions SLP Short Term Goal 2 (Week 1): Pt will use external memory aids to increase recall of functional information with mod I SLP Short Term Goal 3 (Week 1): Pt will complete medication management and problerm solving tasks related to finances with Supervision  Skilled Therapeutic Interventions:  Pt was seen for skilled ST targeting cognitive goals.  Pt was in wheelchair anticipating therapy session as evidenced by him stating "You're late" immediately upon therapist's arrival.  Pt demonstrated excellent recall of daily information and was able to give numerous specific details from all of his therapy appointments today as well as from his medical care.  Pt showed therapist a memory notebook that he reports his mom brought him.  Pt had independently recorded due dates and amounts of all his bills that are due this upcoming month.  Pt was left in his wheelchair with chair alarm set.  Pt stated that after therapist left he wanted to go to the bathroom and get back into bed and was noted to independently utilize the call bell appropriately to convey his needs to nursing.  Continue per current plan of care.    Pain Pain Assessment Pain Scale: 0-10 Pain Score: 2  Pain Type: Acute pain Pain Location: Foot Pain Orientation: Right;Left Pain Descriptors / Indicators: Burning;Heaviness;Discomfort Pain Intervention(s): RN made aware   Therapy/Group: Individual Therapy  Sarahmarie Leavey, Selinda Orion 04/04/2021, 4:21 PM

## 2021-04-04 NOTE — Progress Notes (Signed)
Occupational Therapy Session Note  Patient Details  Name: Bruce Hunter MRN: [id number] Date of Birth: [birth year]  Today's Date: 04/04/2021 OT Individual Time: 1100-1200 OT Individual Time Calculation (min): 60 min    Short Term Goals: Week 1:  OT Short Term Goal 1 (Week 1): Pt will complete BSC/toilet transfer with LRAD with no more than Min A OT Short Term Goal 2 (Week 1): Pt will perform LB dress with Mod A at sit > stand OT Short Term Goal 3 (Week 1): Pt will perform ADL routine with no more than Min cues for sequencing/problem solving OT Short Term Goal 4 (Week 1): Pt will complete ADL routine/activity of choice for 10 minutes without fatigue  Skilled Therapeutic Interventions/Progress Updates:     Pt received in w/c with no pain reported.  ADL:  Pt completes toileting with CGA for standing balance during 3/3 components of toileting Pt completes toileting transfer with RW and CGA for ambulation into bathroom with VC for safe reach back and slower pace of steps Pt completes simulated laundry activity using walker to tranport items to washer, move over to dryer and sit to fold towels with CGA for standing balance.   Therapeutic exercise W/c propulsion with S into all tx destination and on/off elevator with VC for backing into elevator.  Pt completes 2x40 ball toss (chest & overhead pass) in seated position with  to improve BUE coordination/strengthening required for BADLs/functional transfers.  2x15 w/c push up for tricep strengthening to improve BUE use during STS transfers/functional mobility   Pt left at end of session in w/c with exit alarm on, call light in reach and all needs met   Therapy Documentation Precautions:  Precautions Precautions: Fall Precaution Comments: healing trach site, fall, foley, LBP{ Restrictions Weight Bearing Restrictions: No General:   Vital Signs: Therapy Vitals Temp: 98.1 F (36.7 C) Temp Source: Oral Pulse Rate: 80 Resp: 18 BP:  139/87 Patient Position (if appropriate): Lying Oxygen Therapy SpO2: 100 % O2 Device: Room Air Pain: Pain Assessment Pain Scale: 0-10 Pain Score: 5  Pain Type: Acute pain Pain Location: Foot Pain Orientation: Lower Pain Descriptors / Indicators: Aching Pain Frequency: Constant Pain Onset: On-going Patients Stated Pain Goal: 2 Pain Intervention(s): Medication (See eMAR) ADL: ADL Eating: Independent Grooming: Independent Where Assessed-Grooming: Sitting at sink Upper Body Bathing: Supervision/safety Where Assessed-Upper Body Bathing: Sitting at sink Lower Body Bathing: Supervision/safety Where Assessed-Lower Body Bathing: Sitting at sink, Standing at sink Upper Body Dressing: Supervision/safety Where Assessed-Upper Body Dressing: Standing at sink Lower Body Dressing: Supervision/safety Where Assessed-Lower Body Dressing: Sitting at sink, Standing at sink Toileting: Maximal assistance Vision   Perception    Praxis   Exercises:   Other Treatments:     Therapy/Group: Individual Therapy  Tonny Branch 04/04/2021, 6:55 AM

## 2021-04-04 NOTE — Progress Notes (Addendum)
Physical Therapy Session Note  Patient Details  Name: LLIAM MARELLA MRN: [id number] Date of Birth: 01-01-1976  Today's Date: 04/04/2021 PT Individual Time: 0905-1005; [id number] PT Individual Time Calculation (min): 60 min , 30 min  Short Term Goals: Week 1:  PT Short Term Goal 1 (Week 1): Pt will initate gait training with LRAD PT Short Term Goal 2 (Week 1): Pt will tolerate x 30 minutes of activity PT Short Term Goal 3 (Week 1): Pt will tolerate sitting OOB for 30 + minutes  Skilled Therapeutic Interventions/Progress Updates:  Tx 1:  Pt resting in bed.  He rated pain 10/10 bil feet, due to edema.  Premedicated. PT donned bil TEDS.  Therapeutic exercises performed with LEs and trunk to increase strength for functional mobility: supine- 20 x 1 R/L straight leg raises, short arc knee extension with 2 second hold, cervical flexion; r/L side lying- 15 x 1 L/R hip abduction with flexed knees and hips; seated- 20 x 1 heel/toe raises.   In flat bed, no rails, supine> sit with supervision.  Pt donned shoes with set up.  Sit> stand with close supervision; stand pivot to wc with CGA.  Simulated car transfer to sedan height: close supervision and cues for safest entry, and scooting forward before standing to exit.  Gait training iwht RW on level tile, x 150' with close supervision and cues for upright trunk and forward gaze.  Pt reported that his flexed posture is due to foot pain, relying more on UEs on RW.  Wc propulsion for endurance, using bil UEs x 150' with distant supervision.  Cues for brakes before standing.  At end of session, pt seated in wc with needs at hand, seat belt alarm on.  Green monitor of alarm did not activate; NT notified.   Tx 2:  Pt seated in wc, moving around room to make up his bed.  He denied pain.  For activity tolerance, wc propulsion using bil UEs x 250' over level tile with distant supervision.  Stand pivot transfers to [facility] with close  supervision.Endurance activity, using Kinetron in sitting at resistance 50 cm/sec x 2 minutes x 2.  HR 120.  After resting 2 minutes, Kinetron in standing at resistance 60 cm/sec x 30 seconds- HR 138.  HR decreased to 114 in < 1 minute.  At end of session, pt seated in wc with seat belt alarm set and needs at hand.     Therapy Documentation Precautions:  Precautions Precautions: Fall Precaution Comments: healing trach site, fall, foley, LBP{ Restrictions Weight Bearing Restrictions: No      Therapy/Group: Individual Therapy  Sylvie Mifsud 04/04/2021, 10:16 AM

## 2021-04-05 DIAGNOSIS — M109 Gout, unspecified: Secondary | ICD-10-CM | POA: Diagnosis not present

## 2021-04-05 DIAGNOSIS — G7281 Critical illness myopathy: Secondary | ICD-10-CM | POA: Diagnosis not present

## 2021-04-05 DIAGNOSIS — B20 Human immunodeficiency virus [HIV] disease: Secondary | ICD-10-CM | POA: Diagnosis not present

## 2021-04-05 LAB — BASIC METABOLIC PANEL
Anion gap: 6 (ref 5–15)
BUN: 24 mg/dL — ABNORMAL HIGH (ref 6–20)
CO2: 26 mmol/L (ref 22–32)
Calcium: 9 mg/dL (ref 8.9–10.3)
Chloride: 104 mmol/L (ref 98–111)
Creatinine, Ser: 1.07 mg/dL (ref 0.61–1.24)
GFR, Estimated: >60 mL/min (ref >60)
Glucose, Bld: 97 mg/dL (ref 70–99)
Potassium: 3.8 mmol/L (ref 3.5–5.1)
Sodium: 136 mmol/L (ref 135–145)

## 2021-04-05 LAB — GLUCOSE, CAPILLARY
Glucose-Capillary: 80 mg/dL (ref 70–99)
Glucose-Capillary: 154 mg/dL — ABNORMAL HIGH (ref 70–99)
Glucose-Capillary: 208 mg/dL — ABNORMAL HIGH (ref 70–99)
Glucose-Capillary: 124 mg/dL — ABNORMAL HIGH (ref 70–99)

## 2021-04-05 LAB — CBC
HCT: 25.3 % — ABNORMAL LOW (ref 39.0–52.0)
Hemoglobin: 7.9 g/dL — ABNORMAL LOW (ref 13.0–17.0)
MCH: 29.4 pg (ref 26.0–34.0)
MCHC: 31.2 g/dL (ref 30.0–36.0)
MCV: 94.1 fL (ref 80.0–100.0)
Platelets: 311 10*3/uL (ref 150–400)
RBC: 2.69 MIL/uL — ABNORMAL LOW (ref 4.22–5.81)
RDW: 19.3 % — ABNORMAL HIGH (ref 11.5–15.5)
WBC: 11 10*3/uL — ABNORMAL HIGH (ref 4.0–10.5)
nRBC: 0 % (ref 0.0–0.2)

## 2021-04-05 LAB — MAGNESIUM: Magnesium: 1.3 mg/dL — ABNORMAL LOW (ref 1.7–2.4)

## 2021-04-05 MED ORDER — CLONAZEPAM 0.5 MG PO TABS
0.5000 mg | ORAL_TABLET | Freq: Three times a day (TID) | ORAL | Status: DC
  Administered 2021-04-05 – 2021-05-03 (×85): 0.5 mg via ORAL
  Filled 2021-04-05 (×86): qty 1

## 2021-04-05 MED ORDER — LIDOCAINE 5 % EX PTCH
2.0000 | MEDICATED_PATCH | CUTANEOUS | Status: DC
  Administered 2021-04-05 – 2021-05-02 (×27): 2 via TRANSDERMAL
  Filled 2021-04-05 (×28): qty 2
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
## 2021-04-05 NOTE — Progress Notes (Signed)
Speech Language Pathology Daily Session Note  Patient Details  Name: Bruce Hunter MRN: [id number] Date of Birth: Sep 30, 1975  Today's Date: 04/05/2021 SLP Individual Time: 1302-1400 SLP Individual Time Calculation (min): 58 min  Short Term Goals: Week 1: SLP Short Term Goal 1 (Week 1): Pt will t6olerate regular diet and thin liquids with no overt s/sx of aspiration or penetration across 2 consecutive sessions SLP Short Term Goal 2 (Week 1): Pt will use external memory aids to increase recall of functional information with mod I SLP Short Term Goal 3 (Week 1): Pt will complete medication management and problerm solving tasks related to finances with Supervision  Skilled Therapeutic Interventions:Skilled ST services focused on cognitive skills. Pt was on the phone with costumer services canceling credit card due to someone accessing his play station account. SLP facilitated mildly complex problem solving and error awareness skills in account balancing task, pt required mod A verbal cues to initially notice errors due to attempting to complete task at a fast rate, however when pt finally agreed to utilize calculator to check errors pt demonstrated mod I. Pt did not recall medication consumed x2 a day only x1 and x3 a time and requested SLP rewrite medication list to organize medication by times per day, in which SLP did. Pt required supervision A verbal cues to recall medication function/times per day with list. SLP attempted to complete pill organizer with current medication however session was interrupted by phone Hunter continuing to address personal issues. SLP assisted pt in downloading and accessing mail app on phone while pt was on the phone with costumer service, pt required min-supervision A verbal cues for problem solving during alternating attention task. Pt was left in room with Hunter bell within reach and chair alarm set. SLP recommends to continue skilled services     Pain Pain  Assessment Pain Scale: 0-10 Pain Score: 0-No pain PAINAD (Pain Assessment in Advanced Dementia) Negative Vocalization: occasional moan/groan, low speech, negative/disapproving quality Facial Expression: smiling or inexpressive Body Language: relaxed Consolability: no need to console  Therapy/Group: Individual Therapy  Caylen Kuwahara  [facility] 04/05/2021, 3:59 PM

## 2021-04-05 NOTE — Progress Notes (Signed)
Occupational Therapy Session Note  Patient Details  Name: Bruce Hunter MRN: [id number] Date of Birth: February 20, 1976  Today's Date: 04/05/2021 OT Individual Time: [id number] OT Individual Time Calculation (min): 55 min    Short Term Goals: Week 1:  OT Short Term Goal 1 (Week 1): Pt will complete BSC/toilet transfer with LRAD with no more than Min A OT Short Term Goal 2 (Week 1): Pt will perform LB dress with Mod A at sit > stand OT Short Term Goal 3 (Week 1): Pt will perform ADL routine with no more than Min cues for sequencing/problem solving OT Short Term Goal 4 (Week 1): Pt will complete ADL routine/activity of choice for 10 minutes without fatigue   Skilled Therapeutic Interventions/Progress Updates:    Pt greeted at time of session sitting up in wheelchair agreeable to OT session, having LBP and requesting voltaran gel in room, applied to low back. Pt declined ADL and toileting this session. Initiated discussion for DC planning including DME. Pt self propel room > ADL apartment > gym > room with Supervision throughout and BUE propulsion. Walk in shower transfer x2 with Supervision/CGA with RW with posterior entry method, cue to remember sequencing. Focus on BUE there ex in standing with 4# dowel for bicep curls, chest press, overhead press with therapist instructing 15 reps but pt pushing to 25 reps each exercise. Fatigued, and did last set of FWD circles seated, all with 4# dowel. Pt wanting a snack at this time, but spoke with RN who states pt is on strict diet d/t high blood sugars and continued DM education with the pt. Pt self propelling to vending machine and discussion regarding smarter/low carb options. Back in room set up alarm on call bell in reach. Note pt hyperverbal throughout, needing cues to attend d/t pt easily distracted.   Therapy Documentation Precautions:  Precautions Precautions: Fall Precaution Comments: healing trach site, fall, foley, LBP{ Restrictions Weight Bearing  Restrictions: No      Therapy/Group: Individual Therapy  [name] 04/05/2021, 11:29 AM

## 2021-04-05 NOTE — Progress Notes (Signed)
Physical Therapy Session Note  Patient Details  Name: Bruce Hunter MRN: [id number] Date of Birth: [birth year]  Today's Date: 04/05/2021 PT Individual Time: 0900-1000 PT Individual Time Calculation (min): 60 min   Short Term Goals: Week 1:  PT Short Term Goal 1 (Week 1): Pt will initate gait training with LRAD PT Short Term Goal 2 (Week 1): Pt will tolerate x 30 minutes of activity PT Short Term Goal 3 (Week 1): Pt will tolerate sitting OOB for 30 + minutes  Skilled Therapeutic Interventions/Progress Updates:    pt received in bed and agreeable to therapy. Pt reports pain in shoulder, back, and feet, rated "11/10". Pain addressed with therapy to tolerance. Pt performed Stand pivot transfers with CGA- supervision throughout session. Pt propelled w/c with BUE to therapy gym. Pt reported 10/10 shoulder pain while propelling w/c with no other pain responses. Pt performed Sit to stand x 15 with supervision and UE support. Repeated multimodal cues for upright posture. Pt demoes incr lordosis and poor hip extensor activation with forward trunk posture when using walker, slightly improved without. Pt reports pain in his back, agrees to attempt manual therapy in sidelying. Sit<>sidelying with supervision. Pt demoes allodynia over lumbar spine, able to tolerate only grade 1-2 mobilization. Small triggerpoints palpated in para spinals. Pain was most prevalent over sacrum and lower lumbar spine. Squats x 5, pt demoed poor motor planning and incr lordosis. Attempted to teach mechanics of pelvis for improved posture and improved glute activation with poor response. Pt shot baskets in standing x 6 min, taking side steps between shots with RW. Pt returned to room after session and remained in w/c with alarm active and all needs in reach.  Therapy Documentation Precautions:  Precautions Precautions: Fall Precaution Comments: healing trach site, fall, foley, LBP{ Restrictions Weight Bearing Restrictions:  No   Therapy/Group: Individual Therapy  Mickel Fuchs 04/05/2021, 6:52 PM

## 2021-04-05 NOTE — Progress Notes (Signed)
Occupational Therapy Session Note  Patient Details  Name: Bruce Hunter MRN: [id number] Date of Birth: 1975/12/31  Today's Date: 04/05/2021 OT Individual Time: 6160-7371 OT Individual Time Calculation (min): 32 min    Short Term Goals: Week 1:  OT Short Term Goal 1 (Week 1): Pt will complete BSC/toilet transfer with LRAD with no more than Min A OT Short Term Goal 2 (Week 1): Pt will perform LB dress with Mod A at sit > stand OT Short Term Goal 3 (Week 1): Pt will perform ADL routine with no more than Min cues for sequencing/problem solving OT Short Term Goal 4 (Week 1): Pt will complete ADL routine/activity of choice for 10 minutes without fatigue  Skilled Therapeutic Interventions/Progress Updates:    Pt received seated in w/c, agreeable to OT, reporting no pain throughout session/did not interfere. Pt had just ended OT session and requested to go outside for psychosocial wellbeing. Session focused also on BUE HEP using level 3 green theraband to improve strengthening/conditioning in preparation for improved independence in BADLs and functional mobility. Pt self-propelled w/c ~150' with close spvsn and intermittent mod vc's for attention. Outside, pt engaged in [place] as follows: shoulder abduction, forward punches (both unilateral and bilateral), diagonal pulls up, triceps pulls, rows, and overhead pulls - 2 sets of 15-20x each with intermittent rest breaks provided for fatigue. Upon return to room, pt requested new pair of nonskid socks and doffed shoes/new socks set up A. Pt remained seated in w/c, alarm set, call bell in reach, and all immediate needs met.   Therapy Documentation Precautions:  Precautions Precautions: Fall Precaution Comments: healing trach site, fall, foley, LBP{ Restrictions Weight Bearing Restrictions: No  Pain: Pain Assessment Pain Scale: 0-10 Pain Score: 0-No pain     Therapy/Group: Individual Therapy  Mellissa Kohut 04/05/2021, 3:42 PM

## 2021-04-06 DIAGNOSIS — G7281 Critical illness myopathy: Secondary | ICD-10-CM | POA: Diagnosis not present

## 2021-04-06 LAB — GLUCOSE, CAPILLARY
Glucose-Capillary: 179 mg/dL — ABNORMAL HIGH (ref 70–99)
Glucose-Capillary: 120 mg/dL — ABNORMAL HIGH (ref 70–99)
Glucose-Capillary: 158 mg/dL — ABNORMAL HIGH (ref 70–99)
Glucose-Capillary: 172 mg/dL — ABNORMAL HIGH (ref 70–99)

## 2021-04-06 MED ORDER — CHLORHEXIDINE GLUCONATE 0.12 % MT SOLN
OROMUCOSAL | Status: AC
  Administered 2021-04-06: 15 mL
  Filled 2021-04-06: qty 15

## 2021-04-06 NOTE — Progress Notes (Signed)
Patient ID: Bruce Hunter, male   DOB: 12/18/1975, 45 y.o.   MRN: [id number] Met with pt to discuss team conference goals of supervision-mod/I level and target discharge date of 8/8. He voiced he will be ready he just wished the MD could do someothing about his feet and how painful and swollen they are. MD is aware of this. He wants to get diabetic shjoes per MD has placed order in. Pt will be going bck to current address has not sent in application for new apartment. His Mom to take application in tomorrow. Discussed needs he has a cane and rw but needs wc, 3 in 1 and tub seat. Aware will not be able to get home health due to medicaid. He feels he doesn't need it. Will continue to work on discharge needs.

## 2021-04-06 NOTE — Progress Notes (Signed)
Nutrition Follow-up  DOCUMENTATION CODES:   Not applicable  INTERVENTION:  Continue 30 ml ProSource Plus po TID, each supplement provides 100 kcal and 15 grams of protein.     Continue Magic cup TID with meals, each supplement provides 290 kcal and 9 grams of protein.   Continue MVI with minerals daily.  Provide nourishment snacks. RD ordered.   NUTRITION DIAGNOSIS:   Increased nutrient needs related to chronic illness (HIV) as evidenced by estimated needs; ongoing  GOAL:   Patient will meet greater than or equal to 90% of their needs; met  MONITOR:   PO intake, Supplement acceptance, Labs, Weight trends, Skin, I & O's  REASON FOR ASSESSMENT:   Malnutrition Screening Tool    ASSESSMENT:   45 year-old male with medical history of recently diagnosed HIV/AIDS and non-compliant with HAART, DM, asthma, HLD, HTN, and gout who presents with ICU myopathy.  Meal completion has been 100%. Pt currently has Prosource plus ordered and has been consuming them. Pt reports hunger between meals. RD to order nourishment snacks. Labs and medications reviewed.   Diet Order:   Diet Order             Diet Carb Modified Fluid consistency: Thin; Room service appropriate? Yes  Diet effective now                   EDUCATION NEEDS:   Education needs have been addressed  Skin:  Skin Assessment: Reviewed RN Assessment Skin Integrity Issues:: Stage II, Other (Comment) Stage II: L & R buttocks, sacrum Other: MASD coccyx; MARSI L face from ETT holder  Last BM:  8/2  Height:   Ht Readings from Last 1 Encounters:  03/30/21 5' 10"  (1.778 m)    Weight:   Wt Readings from Last 1 Encounters:  03/30/21 84.1 kg   BMI:  Body mass index is 26.6 kg/m.  Estimated Nutritional Needs:   Kcal:  2400-2600  Protein:  130-145 grams  Fluid:  >2.4 L  Corrin Parker, MS, RD, LDN RD pager number/after hours weekend pager number on Amion.

## 2021-04-06 NOTE — Progress Notes (Signed)
Physical Therapy Session Note  Patient Details  Name: Bruce Hunter MRN: [id number] Date of Birth: [birth year]  Today's Date: 04/06/2021 PT Individual Time: 1400-1500 PT Individual Time Calculation (min): 60 min   Short Term Goals: Week 1:  PT Short Term Goal 1 (Week 1): Pt will initate gait training with LRAD PT Short Term Goal 2 (Week 1): Pt will tolerate x 30 minutes of activity PT Short Term Goal 3 (Week 1): Pt will tolerate sitting OOB for 30 + minutes  Skilled Therapeutic Interventions/Progress Updates:    Pt seated in w/c on arrival and agreeable to therapy. Pt reports pain in shoulder, back and feet, unrated but did not interfere with therapy. Pt stood and walked to walker while therapist was retrieving gait belt. Pt requested to use the bathroom, ambulated to bathroom CGA, independent with 3/3 toileting tasks. Pt then ambulated x 200 ft with RW and close spvsn. Trialled ambulation with no AD, Pt reported feeling unsteady but had no LOB x 50 ft with one turn with CGA. Demoed small stride length and stepping with flat foot. Pt then performed side steps in front of mat table 4x 3 laps of 6' table with no AD, spvsn. Pt performed lateral step ups on 6" step with BUE support to promote hip strength and endurance. Gait x 200 ft with spvsn and RW with cues for heel-toe pattern and upright posture. Pt able to maintain heel strike but needed frequent reminders to maintain upright posture. Pt returned to w/c after session and was left with all needs in reach and alarm active.   Therapy Documentation Precautions:  Precautions Precautions: Fall Precaution Comments: healing trach site, fall, foley, LBP{ Restrictions Weight Bearing Restrictions: No   Therapy/Group: Individual Therapy  Mickel Fuchs 04/06/2021, 5:00 PM

## 2021-04-06 NOTE — Progress Notes (Signed)
Occupational Therapy Session Note  Patient Details  Name: Bruce Hunter MRN: [id number] Date of Birth: 1976/01/31  Today's Date: 04/06/2021 OT Individual Time: [id number] OT Individual Time Calculation (min): 54 min    Short Term Goals: Week 1:  OT Short Term Goal 1 (Week 1): Pt will complete BSC/toilet transfer with LRAD with no more than Min A OT Short Term Goal 2 (Week 1): Pt will perform LB dress with Mod A at sit > stand OT Short Term Goal 3 (Week 1): Pt will perform ADL routine with no more than Min cues for sequencing/problem solving OT Short Term Goal 4 (Week 1): Pt will complete ADL routine/activity of choice for 10 minutes without fatigue   Skilled Therapeutic Interventions/Progress Updates:    Pt greeted at time of session sitting up in wheelchair agreeable to OT session, c/o unrated pain and RN aware going to provide pain meds later in session. Focus of session on toileting, UB/LB bathing at sink level (declined showering today planning for later this week) and functional transfers, all with Supervision using RW. Pt is impulsive and has decreased safety awareness, but is not receptive to education as he has "his way" of doing things. Pt pleasant and joking, but still continues to perform tasks his way. UB dress set up, LB dress Supervision as well at sit <> stand level at sink. Note pt insistent on OT standing behind curtain for privacy but still able to keep line of sight on feet and LE's for safety. Pt needing cues throughout ADL for safety. Also discussed DME needs at time of DC home. Provided pt with HEP for BUE there ex with bands for home use, will continue to review in future sessions as well for max carryover. Up in chair alarm on call bell in reach.   Therapy Documentation Precautions:  Precautions Precautions: Fall Precaution Comments: healing trach site, fall, foley, LBP{ Restrictions Weight Bearing Restrictions: No     Therapy/Group: Individual Therapy  [name] 04/06/2021, 4:27 PM

## 2021-04-06 NOTE — Progress Notes (Signed)
Physical Therapy Session Note  Patient Details  Name: Bruce Hunter MRN: [id number] Date of Birth: 1976-06-23  Today's Date: 04/06/2021 PT Individual Time: 0800-0900 PT Individual Time Calculation (min): 60 min   Short Term Goals: Week 1:  PT Short Term Goal 1 (Week 1): Pt will initate gait training with LRAD PT Short Term Goal 2 (Week 1): Pt will tolerate x 30 minutes of activity PT Short Term Goal 3 (Week 1): Pt will tolerate sitting OOB for 30 + minutes  Skilled Therapeutic Interventions/Progress Updates:    Pt received seated in bed, agreeable to PT session. Pt reports some pain in low back and L shoulder, applied pain relief cream at beginning of therapy session. Bed mobility independent. Sit to stand with Supervision to RW throughout session. Ambulatory transfer into bathroom with RW and close Supervision to [place]. Toilet transfer with Supervision and RW. Ambulation x 100 ft, x 150 ft with RW and close Supervision to [facility] for balance. Pt exhibits some impulsivity and decreased safety awareness, letting go of RW during ambulation to dance and to gesture. Dynamic standing balance shooting basketball hoops with min A overall needed for balance, cues to squat down by bending knees rather than bending at the waist to retrieve ball from the floor. Static standing balance performing ball toss against rebounder x 40 reps with CGA for balance. Ascend/descend 8 x 3" stairs with 2 handrails and 4 x 6" stairs with 2 handrails and CGA to close Supervision for balance, intermittent step to and step through gait pattern. Seated UE strengthening therex with green theraband: bicep curls, rows, diagonals, tricep ext x 15-20 reps each, cues for slow, controlled movements through entire range. Pt left seated EOB with needs in reach, bed alarm in place.  Therapy Documentation Precautions:  Precautions Precautions: Fall Precaution Comments: healing trach site, fall, foley, LBP{ Restrictions Weight Bearing  Restrictions: No     Therapy/Group: Individual Therapy   Excell Seltzer, PT, DPT, CSRS  04/06/2021, 5:34 PM

## 2021-04-06 NOTE — Progress Notes (Signed)
Orthopedic Tech Progress Note Patient Details:  Bruce Hunter 07/19/1976 [id number]  Called floor several times to info RN that patient will have to be referred to the [facility] to receive those [place]. Per [facility]  Patient ID: Bruce Hunter, male   DOB: 01-25-1976, 45 y.o.   MRN: [id number]  [name] 04/06/2021, 12:57 PM

## 2021-04-06 NOTE — Patient Care Conference (Signed)
Inpatient RehabilitationTeam Conference and Plan of Care Update Date: 04/06/2021   Time: 11:39 AM    Patient Name: Bruce Hunter      Medical Record Number: [id number]  Date of Birth: 04/05/1976 Sex: Male         Room/Bed: 4M09C/4M09C-01 Payor Info: Payor: [place] MEDICAID PREPAID HEALTH PLAN / Plan: [place] MEDICAID WELLCARE / Product Type: *No Product type* /    Admit Date/Time:  03/30/2021  2:52 PM  Primary Diagnosis:  Intensive care (ICU) myopathy  Hospital Problems: Principal Problem:   Intensive care (ICU) myopathy Active Problems:   AIDS (acquired immune deficiency syndrome) (HCC)   Debility   Gout attack    Expected Discharge Date: Expected Discharge Date: 04/12/21  Team Members Present: Physician leading conference: Dr. Krutika Raulkar Social Worker Present: Becky Dupree, LCSW Nurse Present:  , RN PT Present: Olivia Brittain, PT OT Present: Hannah Spach, OT SLP Present: Madison Cratch, SLP PPS Coordinator present : Melissa Bowie, SLP     Current Status/Progress Goal Weekly Team Focus  Bowel/Bladder   continent B/B  remain continent      Swallow/Nutrition/ Hydration   regular textures and thin liquids mod I  mod I - goal met      ADL's   CGA/Supervision LB bathe/dress, Supervision transfers w/ RW, assist managing Foley  Mod I  standing balance/tolerance, pain management, ADl retraining, global endurance, IADL retraining   Mobility   STS CGA-supervision, standing balance CGA-supervision, w/c supervision, gait CGA limited by pain  mod I  pain and edema management, gait, community reintegration   Communication             Safety/Cognition/ Behavioral Observations  min-supervision A  Mod I  complex problem solving, error awareness and recall   Pain   pain reported  < 3      Skin   stage 2 x2 to buttocks, MARSI to the face  no new skin breakdown        Discharge Planning:  Home alone with intermittent assist from Mom and friend, he is trying to get  into handicapped apartment prior to DC   Team Discussion: Would like a diabetic shoe. Continent B/B, taking pain medication when appropriate. Has 2 stage 2's to the buttocks, and an MARSI to the face. Supervision for sink level bathing. Supervision to contact guard for ADL transfers. Contact guard to supervision for standing balance. Standing balance is limited by pain. SLP is working on cognition.  Patient on target to meet rehab goals: yes, mod I goals.  *See Care Plan and progress notes for long and short-term goals.   Revisions to Treatment Plan:  MD added K-pad for comfort.   Teaching Needs: Family education, medication management, pain management, skin/wound care, diabetes education, transfer training, gait training, balance training, endurance training, safety awareness.  Current Barriers to Discharge: Decreased caregiver support, Medical stability, Home enviroment access/layout, Wound care, Lack of/limited family support, Weight, Medication compliance, and Behavior  Possible Resolutions to Barriers: Continue current medications, provide emotional support.     Medical Summary Current Status: type 2 DM, BKA, overweight (BMI 26.60), impaired vision, lumbar myofascial pain- allodynia  Barriers to Discharge: Wound care;Weight  Barriers to Discharge Comments: type 2 DM, BKA, overweight (BMI 26.60), impaired vision, lumbar myofascial pain- allodynia Possible Resolutions to Barriers/Weekly Focus: schedule for outpatietn Qutenza, provide dietary education, schedule outpatient ophthalmology follow-up, add kpad   Continued Need for Acute Rehabilitation Level of Care: The patient requires daily medical management by a physician   with specialized training in physical medicine and rehabilitation for the following reasons: Direction of a multidisciplinary physical rehabilitation program to maximize functional independence : Yes Medical management of patient stability for increased activity  during participation in an intensive rehabilitation regime.: Yes Analysis of laboratory values and/or radiology reports with any subsequent need for medication adjustment and/or medical intervention. : Yes   I attest that I was present, lead the team conference, and concur with the assessment and plan of the team.   ,  G 04/06/2021, 6:06 PM        
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
## 2021-04-06 NOTE — Progress Notes (Signed)
Speech Language Pathology Daily Session Note  Patient Details  Name: Bruce Hunter MRN: [id number] Date of Birth: 1976/05/19  Today's Date: 04/06/2021 SLP Individual Time: 1330-1400 SLP Individual Time Calculation (min): 30 min  Short Term Goals: Week 1: SLP Short Term Goal 1 (Week 1): Pt will t6olerate regular diet and thin liquids with no overt s/sx of aspiration or penetration across 2 consecutive sessions SLP Short Term Goal 2 (Week 1): Pt will use external memory aids to increase recall of functional information with mod I SLP Short Term Goal 3 (Week 1): Pt will complete medication management and problerm solving tasks related to finances with Supervision  Skilled Therapeutic Interventions: Skilled treatment session focused on cognitive goals. Upon arrival, patient was on the phone with his bank attempting to pay his bills. Patient completed task both independently and successfully. SLP facilitated session with a complex scheduling task. Patient with mild resistance to completing task due to decreased mental flexibility but did verbally complete a scheduling task that was limited to tasks that he may complete within a normal day. Patient handed off to PT. Continue with current plan of care.      Pain Pain Assessment Pain Score: 2  PAINAD (Pain Assessment in Advanced Dementia) Breathing: normal Negative Vocalization: none Facial Expression: smiling or inexpressive Body Language: relaxed Consolability: no need to console PAINAD Score: 0  Therapy/Group: Individual Therapy  Diamante Truszkowski 04/06/2021, 3:22 PM

## 2021-04-07 DIAGNOSIS — G7281 Critical illness myopathy: Secondary | ICD-10-CM | POA: Diagnosis not present

## 2021-04-07 LAB — GLUCOSE, CAPILLARY
Glucose-Capillary: 196 mg/dL — ABNORMAL HIGH (ref 70–99)
Glucose-Capillary: 129 mg/dL — ABNORMAL HIGH (ref 70–99)
Glucose-Capillary: 179 mg/dL — ABNORMAL HIGH (ref 70–99)
Glucose-Capillary: 116 mg/dL — ABNORMAL HIGH (ref 70–99)

## 2021-04-07 MED ORDER — FUROSEMIDE 40 MG PO TABS
40.0000 mg | ORAL_TABLET | Freq: Once | ORAL | Status: AC
  Administered 2021-04-07: 40 mg via ORAL
  Filled 2021-04-07: qty 1

## 2021-04-07 MED ORDER — FUROSEMIDE 20 MG PO TABS
10.0000 mg | ORAL_TABLET | Freq: Every day | ORAL | Status: DC
  Administered 2021-04-07: 40 mg via ORAL
  Administered 2021-04-08: 10 mg via ORAL
  Filled 2021-04-07: qty 1

## 2021-04-07 NOTE — Progress Notes (Signed)
Physical Therapy Session Note  Patient Details  Name: Bruce Hunter MRN: [id number] Date of Birth: July 07, 1976  Today's Date: 04/07/2021 PT Individual Time: 1000-1035 PT Individual Time Calculation (min): 35 min   Short Term Goals: Week 1:  PT Short Term Goal 1 (Week 1): Pt will initate gait training with LRAD PT Short Term Goal 2 (Week 1): Pt will tolerate x 30 minutes of activity PT Short Term Goal 3 (Week 1): Pt will tolerate sitting OOB for 30 + minutes   Skilled Therapeutic Interventions/Progress Updates:     Patient in w/c in the room upon PT arrival. Patient alert and agreeable to PT session. Patient reported 10/10 B foot, back, and R forearm pain during session, patient premedicated prior to session. PT provided repositioning, rest breaks, and distraction as pain interventions throughout session.   Therapeutic Activity: Patient with significant B lower extremity pitting edema localized to his feet with TED hose donned. Noted patient with drop down leg rests on w/c and patient reports sitting up several hours per day in the w/c and stated that the recliner hurts his back. Retrieved and fitted B ELRs for w/c to allow for lower extremity elevation for edema control in prolonged sitting. Educated patient on diabetic foot care, patient expressed that he already was performing daily foot check, hygiene, and skin care PTA.   Therapeutic Exercise: Patient propelled wheelchair 200-300 feet x3 with mod I for upper extremity strengthening endurance. Provided verbal cues for increased stroke length to improve propulsion speed and diaphragmatic breathing for improved breath support with fatigue.   Patient in w/c in the room with B elevated at end of session with breaks locked, chair alarm set, and all needs within reach.   Therapy Documentation Precautions:  Precautions Precautions: Fall Precaution Comments: healing trach site, fall, foley, LBP{ Restrictions Weight Bearing Restrictions:  No    Therapy/Group: Individual Therapy  Celsey Asselin L Rayvion Stumph PT, DPT  04/07/2021, 6:02 PM

## 2021-04-07 NOTE — Progress Notes (Signed)
Occupational Therapy Session Note  Patient Details  Name: Bruce Hunter MRN: [id number] Date of Birth: [birth year]  Today's Date: 04/07/2021 OT Individual Time: [id number] and [id number] OT Individual Time Calculation (min): 27 min and 70 min   Short Term Goals: Week 1:  OT Short Term Goal 1 (Week 1): Pt will complete BSC/toilet transfer with LRAD with no more than Min A OT Short Term Goal 2 (Week 1): Pt will perform LB dress with Mod A at sit > stand OT Short Term Goal 3 (Week 1): Pt will perform ADL routine with no more than Min cues for sequencing/problem solving OT Short Term Goal 4 (Week 1): Pt will complete ADL routine/activity of choice for 10 minutes without fatigue   Skilled Therapeutic Interventions/Progress Updates:    Session 1: Pt greeted at time of session semireclined in bed resting but easily woken with verbal stimuli. Pt wanting to change socks this am, therapist assist with donning TEDs total A attempted to have pt help but adamantly declined saying he couldn't do it. MD entered at this time making rounds, pt having questions about swelling in feet. Recommended ice and OT retrieved ice packs for swelling to be given at end of session. Pt donned gripper socks with set up and attempting to walk to bathroom no AD but stopped at therapist provided RW. Pt walked to/from bed <> bathroom Supervision and performed 3/3 tasks same manner before walking to wheelchair. Set up with alarm on call bell in reach.   Session 2: Pt greeted at time of session sitting up in wheelchair agreeable to OT session, no c/o pain. Ambulating to bathroom without AD, pt exhibiting decreased safety and impulsivity. Agreeable to shower, ambulated throughout room to gather items including soap, washcloth, and towels with Supervision as pt exhibiting decreased safety awareness with cues for managing AD and having UE support on firm surface instead of RW when reaching for items. Pt insisting on wearing shower shoes  flip flops despite education that these are not good footwear and are fall risk, continued to put on shoes despite education. UB/LB bathing Supervision for safety only but pt gathered all items. Trach site covered with waterproof dressing and IV covered as well. UB/LB dressing with Set up/Supervision d/t decreased safety pt trying to doff/don LB clothing in standing and not wanting to sit despite education but eventually agreed. Pt provided scissors (took from room at end of session) for requested task of cutting coupons as well. Pt up in chair with alarm on call bell in reach and mother present to visit.   Therapy Documentation Precautions:  Precautions Precautions: Fall Precaution Comments: healing trach site, fall, foley, LBP{ Restrictions Weight Bearing Restrictions: No    Therapy/Group: Individual Therapy  [name] 04/07/2021, 7:19 AM

## 2021-04-07 NOTE — Progress Notes (Signed)
Physical Therapy Session Note  Patient Details  Name: STEFONE SHERLING MRN: [id number] Date of Birth: 01-20-1976  Today's Date: 04/07/2021 PT Individual Time: 1300-1345 PT Individual Time Calculation (min): 45 min   Short Term Goals: Week 1:  PT Short Term Goal 1 (Week 1): Pt will initate gait training with LRAD PT Short Term Goal 2 (Week 1): Pt will tolerate x 30 minutes of activity PT Short Term Goal 3 (Week 1): Pt will tolerate sitting OOB for 30 + minutes  Skilled Therapeutic Interventions/Progress Updates:    Pt using restroom on arrival in care of NT. Pt finished toileting and performed 3/3 toileting tasks mod I, pt stood to wash hands with supervision. Pt performed Sit to stand with supervision throughout session. Pt frequently demoes poor safety awareness. Pt propelled w/c with BUE x 200 ft to therapy gym. Gait 3 x 90 ft, with RW and 2 laps w/o RW. Pt cued for longer step length, upright posture, and normalized gait speed. Gait x 200 ft back to room. Pt requested to use the bathroom, 3/3 toileting tasks mod I. Pt returned to w/c and was left with leg rests elevated, all needs in reach and alarm active.   Therapy Documentation Precautions:  Precautions Precautions: Fall Precaution Comments: healing trach site, fall, foley, LBP{ Restrictions Weight Bearing Restrictions: No    Therapy/Group: Individual Therapy  Mickel Fuchs 04/07/2021, 5:11 PM
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
## 2021-04-07 NOTE — Progress Notes (Signed)
Physical Therapy Session Note  Patient Details  Name: Bruce Hunter MRN: [id number] Date of Birth: [birth year]  Today's Date: 04/07/2021 PT Individual Time: [id number]; 1436-1500 PT Individual Time Calculation (min): 8 min and 24 min  Short Term Goals: Week 1:  PT Short Term Goal 1 (Week 1): Pt will initate gait training with LRAD PT Short Term Goal 2 (Week 1): Pt will tolerate x 30 minutes of activity PT Short Term Goal 3 (Week 1): Pt will tolerate sitting OOB for 30 + minutes  Skilled Therapeutic Interventions/Progress Updates:    Session 1: Pt received seated in w/c urgently requesting to use the bathroom, reports he requested assistance 20 min ago and has been waiting. Pt very impulsive and disconnects his safety alarm belt independently, stands up with no AD and without w/c leg rests properly moved out of the way. Education with patient regarding safety and impulsivity. Provided RW to patient, ambulatory transfer into bathroom with Supervision. Toilet transfer with Supervision, independent for 3/3 toileting tasks. Pt able to wash hands in standing with Supervision. Pt then requesting time to finish eating lunch with his mom. Pt missed 13 min of scheduled therapy session to allow time to eat his lunch.  Session 2: Pt received after finishing lunch and agreeable to PT session. Pt reports 10/10 pain in B feet and declines any standing activity this session. Manual w/c propulsion to/from therapy gym at Supervision level. Seated OH rebounder ball toss x 50 reps, chest press from elevated rebounder x 40 reps to fatigue. Seated 4# dowel rod volleyball x 30 reps to fatigue. Pt left seated in w/c in room with needs in reach, quick release belt in place at end of session.  Therapy Documentation Precautions:  Precautions Precautions: Fall Precaution Comments: healing trach site, fall, foley, LBP{ Restrictions Weight Bearing Restrictions: No General: PT Amount of Missed Time (min): 13 Minutes PT  Missed Treatment Reason: Unavailable (Comment) (eating lunch)     Therapy/Group: Individual Therapy   Excell Seltzer, PT, DPT, CSRS  04/07/2021, 5:28 PM

## 2021-04-08 DIAGNOSIS — G7281 Critical illness myopathy: Secondary | ICD-10-CM | POA: Diagnosis not present

## 2021-04-08 LAB — GLUCOSE, CAPILLARY
Glucose-Capillary: 148 mg/dL — ABNORMAL HIGH (ref 70–99)
Glucose-Capillary: 103 mg/dL — ABNORMAL HIGH (ref 70–99)
Glucose-Capillary: 136 mg/dL — ABNORMAL HIGH (ref 70–99)
Glucose-Capillary: 113 mg/dL — ABNORMAL HIGH (ref 70–99)

## 2021-04-08 LAB — BASIC METABOLIC PANEL
Anion gap: 10 (ref 5–15)
BUN: 34 mg/dL — ABNORMAL HIGH (ref 6–20)
CO2: 23 mmol/L (ref 22–32)
Calcium: 9.3 mg/dL (ref 8.9–10.3)
Chloride: 101 mmol/L (ref 98–111)
Creatinine, Ser: 1.47 mg/dL — ABNORMAL HIGH (ref 0.61–1.24)
GFR, Estimated: 60 mL/min — ABNORMAL LOW (ref >60)
Glucose, Bld: 162 mg/dL — ABNORMAL HIGH (ref 70–99)
Potassium: 3.9 mmol/L (ref 3.5–5.1)
Sodium: 134 mmol/L — ABNORMAL LOW (ref 135–145)

## 2021-04-08 NOTE — Progress Notes (Signed)
Occupational Therapy Session Note  Patient Details  Name: Bruce Hunter MRN: [id number] Date of Birth: 16-Jan-1976  Today's Date: 04/08/2021 OT Individual Time: 1130-1157 and [id number] OT Individual Time Calculation (min): 27 min and 54 min   Short Term Goals: Week 1:  OT Short Term Goal 1 (Week 1): Pt will complete BSC/toilet transfer with LRAD with no more than Min A OT Short Term Goal 2 (Week 1): Pt will perform LB dress with Mod A at sit > stand OT Short Term Goal 3 (Week 1): Pt will perform ADL routine with no more than Min cues for sequencing/problem solving OT Short Term Goal 4 (Week 1): Pt will complete ADL routine/activity of choice for 10 minutes without fatigue   Skilled Therapeutic Interventions/Progress Updates:    Session 1: Pt greeted at time of session sleeping in bed easily woken and agreeable to OT session with encouragement, initially saying he "didn't want to do anything." Ambulated bed > bathroom > wheelchair Supervision with RW and 3/3 toileting tasks same manner. Pt eating snack at beginning of session, saying RN had provided earlier as his one treat of the day for his blood sugars. Pt then self propel room <> laundry room with Mod I and put clothes in laundry, retrieved detergent, and started with Supervision for new environment. Back in room alarm on call bell in reach.   Session 2: Pt greeted at time of session sitting up in wheelchair with visitor in room requesting snacks, spoke with RN who stated pt now has scheduled snack times at 10 am, 2pm, and 8pm in between meals d/t blood sugar issues. Relayed this to pt. After this, pt self propel room <> gym Mod I and performed dynamic standing at BITS 2 rounds for circles only 3 minutes and 95% accuracy, second round standing on airex mat with 3 sequence colors in rotating pattern for 2 mins with 9% accuracy and no LOB. Standing on airex performed BUE there ex with 5# dowel bicep curl, chest press, overhead press in reps of 20.  Discussion throughout session regarding diabetes education and high protein low carb meal choices. Back in room alarm on call bell in reach   Therapy Documentation Precautions:  Precautions Precautions: Fall Precaution Comments: healing trach site, fall, foley, LBP{ Restrictions Weight Bearing Restrictions: No     Therapy/Group: Individual Therapy  [name] 04/08/2021, 7:24 AM

## 2021-04-08 NOTE — Progress Notes (Signed)
Patient ordered pizza in conjunction to supper and stated he was hungry.  He requested for extra [place] crackers and staff educated patient about his DM and he has scheduled snacks in the refrigerator.  Patient stated got upset and threw his snack yogurt across the room stating he missed the trash can.

## 2021-04-08 NOTE — Progress Notes (Signed)
Occupational Therapy Session Note  Patient Details  Name: Bruce Hunter MRN: [id number] Date of Birth: October 07, 1975  Today's Date: 04/09/2021 OT Individual Time: [id number] OT Individual Time Calculation (min): 64 min    Skilled Therapeutic Interventions/Progress Updates:    Pt greeted in his w/c, requesting to shower. He used the RW to gather needed items for bathing/dressing, adamant that he wanted to wear flip flops vs gripper socks for functional ambulation into bathroom. OT educated pt that it was safer to wear gripper socks vs flip flops however pt refused education. RW used for transfer to toilet first where pt had +bladder void. He transitioned to the shower chair and then bathed with setup assistance. Dressing completed after at [place] I level using RW for standing support. Cues to use adaptive bag method to don his Ted hose without assistance. Grooming tasks completed while w/c level in the room and then he used the restroom for a second time, ambulating without AD and distant supervision. After washing his hands at the sink, pt returned to the w/c. Left him seated with all needs within reach and safety belt fastened.   IV site covered prior to shower, also consulted RN about trach dressings- ok to let dressings get wet and then change them after shower  Therapy Documentation Precautions:  Precautions Precautions: Fall Precaution Comments: healing trach site, fall, LBP Restrictions Weight Bearing Restrictions: No  Vital Signs: Therapy Vitals Temp: 98.5 F (36.9 C) Pulse Rate: 100 Resp: 18 BP: (!) 148/93 Patient Position (if appropriate): Sitting Oxygen Therapy SpO2: 99 % O2 Device: Room Air Pain: in back and feet. Pt used the call button to notify RN of his request for pain medicine at end of tx Pain Assessment Pain Scale: 0-10 Pain Score: 7  Pain Location: Foot ADL: ADL Eating: Independent Grooming: Independent Where Assessed-Grooming: Sitting at sink Upper  Body Bathing: Modified independent Where Assessed-Upper Body Bathing: Sitting at sink Lower Body Bathing: Modified independent Where Assessed-Lower Body Bathing: Sitting at sink, Standing at sink Upper Body Dressing: Modified independent (Device) Where Assessed-Upper Body Dressing: Standing at sink Lower Body Dressing: Modified independent Where Assessed-Lower Body Dressing: Sitting at sink, Standing at sink Toileting: Modified independent Toilet Transfer: Modified independent Tub/Shower Transfer: Close supervison        Therapy/Group: Individual Therapy  Avaeh Ewer A Cristina Mattern 04/09/2021, 3:30 PM
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
## 2021-04-08 NOTE — Progress Notes (Signed)
Occupational Therapy Weekly Progress Note  Patient Details  Name: Bruce Hunter MRN: [id number] Date of Birth: 03/13/1976  Beginning of progress report period: March 31, 2021 End of progress report period: April 08, 2021    Patient has met 4 of 4 short term goals.  Pt has made significant progress with OT goals as pt's pain is more controlled and able to participate more in sessions. Pt is overall Supervision with all self care tasks and is performing functional mobility with and without AD but does better/more safe with RW. Pt is impulsive, had decreased safety, and has his own way of performing tasks and is not very receptive to education.   Patient continues to demonstrate the following deficits: muscle weakness, decreased cardiorespiratoy endurance, decreased safety awareness, and decreased standing balance, decreased postural control, and decreased balance strategies and therefore will continue to benefit from skilled OT intervention to enhance overall performance with BADL and iADL.  Patient progressing toward long term goals..  Continue plan of care.  OT Short Term Goals Week 1:  OT Short Term Goal 1 (Week 1): Pt will complete BSC/toilet transfer with LRAD with no more than Min A OT Short Term Goal 1 - Progress (Week 1): Met OT Short Term Goal 2 (Week 1): Pt will perform LB dress with Mod A at sit > stand OT Short Term Goal 2 - Progress (Week 1): Met OT Short Term Goal 3 (Week 1): Pt will perform ADL routine with no more than Min cues for sequencing/problem solving OT Short Term Goal 3 - Progress (Week 1): Met OT Short Term Goal 4 (Week 1): Pt will complete ADL routine/activity of choice for 10 minutes without fatigue OT Short Term Goal 4 - Progress (Week 1): Met Week 2:  OT Short Term Goal 1 (Week 2): STGs = LTGs at Mod I    Therapy Documentation Precautions:  Precautions Precautions: Fall Precaution Comments: healing trach site, fall, foley, LBP{ Restrictions Weight  Bearing Restrictions: No    Therapy/Group: Individual Therapy  [name] 04/08/2021, 4:41 PM

## 2021-04-08 NOTE — Progress Notes (Signed)
Physical Therapy Weekly Progress Note  Patient Details  Name: Bruce Hunter MRN: [id number] Date of Birth: 06/25/1976  Beginning of progress report period: March 31, 2021 End of progress report period: April 08, 2021  Today's Date: 04/08/2021 PT Individual Time: 0800-0857, 1500-1530 PT Individual Time Calculation (min): 57 min, 30 min   Patient has met 3 of 3 short term goals.  Pt performing transfers with supervision, with and without RW. Gait with RW up to 200 ft, up to 63 ft with no AD and CGA. Pt has poor safety awareness and is often impulsive. No family training has been completed at this point.  Patient continues to demonstrate the following deficits muscle weakness, decreased cardiorespiratoy endurance, and decreased standing balance and decreased balance strategies and therefore will continue to benefit from skilled PT intervention to increase functional independence with mobility.  Patient progressing toward long term goals..  Continue plan of care.  PT Short Term Goals Week 1:  PT Short Term Goal 1 (Week 1): Pt will initate gait training with LRAD PT Short Term Goal 1 - Progress (Week 1): Met PT Short Term Goal 2 (Week 1): Pt will tolerate x 30 minutes of activity PT Short Term Goal 2 - Progress (Week 1): Met PT Short Term Goal 3 (Week 1): Pt will tolerate sitting OOB for 30 + minutes PT Short Term Goal 3 - Progress (Week 1): Met Week 2:  PT Short Term Goal 1 (Week 2): =LTGs d/t ELOS  Skilled Therapeutic Interventions/Progress Updates:  Ambulation/gait training;Balance/vestibular training;Cognitive remediation/compensation;Community reintegration;Discharge planning;Disease management/prevention;DME/adaptive equipment instruction;Functional mobility training;Neuromuscular re-education;Pain management;Psychosocial support;Patient/family education;Stair training;Therapeutic Activities;Therapeutic Exercise;UE/LE Strength taining/ROM;UE/LE Coordination activities;Wheelchair  propulsion/positioning   Session 1: pt received in bed and agreeable to therapy. No complaint of pain. Pt requested to use the bathroom. Ambulated to bathroom with supervision, toileting tasks completed mod I. Pt propelled w/c with BUE x 200 ft to therapy gym. Performed step taps with therapist instructing 20, pt performing 25. Pt then ascended/descended 6" stairs with BIL handrails 3x4, followed by navigating same steps with R handrail only x 4, Pt had difficulty ascending steps with UUE support. Step ups 2 x 10 BIL with BUE support for improved LE strength.  Pt requested to use bathroom again and did so in same manner as above. Gait 2 x 200 ft with close supervision. Pt returned to room and remained in w/c, pt was left with all needs in reach and alarm active.    Session 2: Pt in bed on arrival, in foul mood but agreeable to therapy. Pt reports his pain to be the same as usual, did not interfere with therapy. Bed mobility with supervision, ambulatory transfer with supervision to w/c. Pt propelled w/c 250 ft to retrieve laundry. Pt stood from w/c and began to walk into laundry room while therapist was propping open door, and proceeded to reach over chair to get laundry bags instead of walking around. Pt retrieved laundry from dryer and returned to chair before propelling back to room. Pt stood to fold laundry and ambulated in room distances without RW with close supervision. Therapist applied voltaren gel to pt's shoulder and back. Pt remained in w/c to await pending OT session, was left with all needs in reach and alarm active.   Therapy Documentation Precautions:  Precautions Precautions: Fall Precaution Comments: healing trach site, fall, foley, LBP{ Restrictions Weight Bearing Restrictions: No    Vital Signs: Therapy Vitals Temp: 98.3 F (36.8 C) Temp Source: Oral Pulse Rate: 96 Resp: 16  BP: (!) 141/96 Patient Position (if appropriate): Lying Oxygen Therapy SpO2: 100 % O2 Device:  Room Air Pain: Pain Assessment Pain Scale: 0-10 Pain Score: 10-Worst pain ever Pain Type: Chronic pain Pain Location: Foot Pain Orientation: Right;Left Pain Descriptors / Indicators: Aching;Discomfort Pain Onset: With Activity Pain Intervention(s): Medication (See eMAR)   Therapy/Group: Individual Therapy  Mickel Fuchs 04/08/2021, 3:50 PM

## 2021-04-08 NOTE — Progress Notes (Addendum)
Occupational Therapy Discharge Summary  Patient Details  Name: Bruce Hunter MRN: [id number] Date of Birth: 1975-12-26   Patient has met 9 of 10 long term goals due to improved activity tolerance, improved balance, postural control, and improved coordination.  Patient to discharge at overall supervision-Modified Independent. Patient's care partner is independent to provide the necessary physical assistance at discharge PRN.  Pt primarily lives alone but does have assist from friends/family PRN. Pt does have decreased safety awareness and is not receptive to most education but is Mod I for dressing/toileting and set up for bathing tasks. Pt does need Supervision for light IADLs such as meal prep and laundry tasks.   Pt still requires light supervision for meal prep/IADL tasks, therefore meal prep goal unable to be met  Recommendation:  No follow up  Equipment: BSC, shower seat  Reasons for discharge: treatment goals met and discharge from hospital  Patient/family agrees with progress made and goals achieved: Yes  OT Discharge Precautions/Restrictions  Precautions Precautions: Fall Precaution Comments: healing trach site, fall, LBP Restrictions Weight Bearing Restrictions: No Vital Signs Therapy Vitals Temp: 98.3 F (36.8 C) Temp Source: Oral Pulse Rate: 96 Resp: 16 BP: (!) 141/96 Patient Position (if appropriate): Lying Oxygen Therapy SpO2: 100 % O2 Device: Room Air Pain Pain Assessment Pain Scale: 0-10 Pain Score: 2  Pain Type: Chronic pain Pain Location: Foot Pain Orientation: Right;Left Pain Descriptors / Indicators: Aching;Discomfort Pain Onset: With Activity Pain Intervention(s): Medication (See eMAR) ADL ADL Eating: Independent Grooming: Independent Where Assessed-Grooming: Sitting at sink Upper Body Bathing: Setup Where Assessed-Upper Body Bathing: Setup Lower Body Bathing: Setup Where Assessed-Lower Body Bathing: Shower Upper Body Dressing: Modified  independent (Device) Where Assessed-Upper Body Dressing: Standing at sink Lower Body Dressing: Modified independent Where Assessed-Lower Body Dressing: Sitting at sink, Standing at sink Toileting: Modified independent Toilet Transfer: Modified independent Tub/Shower Transfer: Modified independent Vision Baseline Vision/History: No visual deficits Patient Visual Report: No change from baseline Perception  Perception: Within Functional Limits Praxis Praxis: Impaired Cognition Overall Cognitive Status: Impaired/Different from baseline Arousal/Alertness: Awake/alert Memory: Impaired Awareness: Appears intact Problem Solving: Appears intact Safety/Judgment: appears intact Sensation Sensation Light Touch: Impaired by gross assessment Coordination Gross Motor Movements are Fluid and Coordinated: No Fine Motor Movements are Fluid and Coordinated: Yes Coordination and Movement Description: grossly uncoordinated d/t deconditioning Motor  Motor Motor - Skilled Clinical Observations: grossly uncoordinated d/t deconditioning Mobility  Transfers Sit to Stand: Independent with assistive device Stand to Sit: Independent with assistive device  Trunk/Postural Assessment  Cervical Assessment Cervical Assessment: Within Functional Limits Thoracic Assessment Thoracic Assessment: Within Functional Limits Lumbar Assessment Lumbar Assessment: Within Functional Limits Postural Control Postural Control: Within Functional Limits  Balance Balance Balance Assessed: Yes Dynamic Sitting Balance Dynamic Sitting - Balance Support: Feet supported Dynamic Sitting - Level of Assistance: 6: Modified independent (Device/Increase time) Dynamic Sitting - Balance Activities: Lateral lean/weight shifting;Forward lean/weight shifting;Reaching for objects Static Standing Balance Static Standing - Balance Support: Bilateral upper extremity supported;During functional activity Static Standing - Level of  Assistance: 6: Modified independent (Device/Increase time) Dynamic Standing Balance Dynamic Standing - Balance Support: No upper extremity supported;Bilateral upper extremity supported Dynamic Standing - Level of Assistance: 6: Modified independent (Device/Increase time) Dynamic Standing - Balance Activities: Lateral lean/weight shifting;Forward lean/weight shifting;Reaching for objects Extremity/Trunk Assessment RUE Assessment RUE Assessment: Within Functional Limits LUE Assessment LUE Assessment: Within Functional Limits   [name] 04/08/2021, 4:53 PM

## 2021-04-08 NOTE — Progress Notes (Signed)
Speech Language Pathology Weekly Progress and Session Note  Patient Details  Name: Bruce Hunter MRN: [id number] Date of Birth: 27-Jul-1976  Beginning of progress report period:  03/30/2021 End of progress report period:  04/08/2021  Today's Date: 04/08/2021 SLP Individual Time: 1030-1100 SLP Individual Time Calculation (min): 30 min  Short Term Goals: Week 1: SLP Short Term Goal 1 (Week 1): Pt will t6olerate regular diet and thin liquids with no overt s/sx of aspiration or penetration across 2 consecutive sessions SLP Short Term Goal 1 - Progress (Week 1): Met SLP Short Term Goal 2 (Week 1): Pt will use external memory aids to increase recall of functional information with mod I SLP Short Term Goal 2 - Progress (Week 1): Met SLP Short Term Goal 3 (Week 1): Pt will complete medication management and problerm solving tasks related to finances with Supervision SLP Short Term Goal 3 - Progress (Week 1): Met    New Short Term Goals: Week 2: SLP Short Term Goal 1 (Week 2): STG=LTG (ELOS: discharge 8/8)  Weekly Progress Updates:  Patient made good progress, meeting all 3 STG's related to cognition and swallow function with SLP able to discharge swallow function goals. He continues to benefit from skilled intervention focused on complex level reasoning and problem solving.    Intensity: Minumum of 1-2 x/day, 30 to 90 minutes Frequency: 3 to 5 out of 7 days Duration/Length of Stay: 8/8 Treatment/Interventions: Cognitive remediation/compensation;Patient/family education;Therapeutic Activities   Daily Session  Skilled Therapeutic Interventions: Patient seen to address cognitive function goals with focus on reasoning, functional problem solving and safety as well as anticipatory awareness. Patient reported and demonstrated good use of notebook for writing things down for recall and reporting awareness that his memory was not good. He was able to ambulate from [facility] to bathroom and toilet himself  without assistance, however he did not use walker as is recommended by PT. Patient continues to benefit from skilled SLP intervention to maximize cognitive function prior to discharge.  General    Pain Pain Assessment Pain Scale: 0-10 Pain Score: 0-No pain Pain Type: Chronic pain Pain Location: Foot Pain Orientation: Right;Left Pain Descriptors / Indicators: Aching;Discomfort Pain Onset: With Activity Pain Intervention(s): Medication (See eMAR)  Therapy/Group: Individual Therapy  Sonia Baller, MA, CCC-SLP Speech Therapy

## 2021-04-09 LAB — GLUCOSE, CAPILLARY
Glucose-Capillary: 103 mg/dL — ABNORMAL HIGH (ref 70–99)
Glucose-Capillary: 118 mg/dL — ABNORMAL HIGH (ref 70–99)
Glucose-Capillary: 128 mg/dL — ABNORMAL HIGH (ref 70–99)
Glucose-Capillary: 132 mg/dL — ABNORMAL HIGH (ref 70–99)

## 2021-04-09 LAB — BASIC METABOLIC PANEL
Anion gap: 10 (ref 5–15)
BUN: 31 mg/dL — ABNORMAL HIGH (ref 6–20)
CO2: 24 mmol/L (ref 22–32)
Calcium: 9 mg/dL (ref 8.9–10.3)
Chloride: 100 mmol/L (ref 98–111)
Creatinine, Ser: 1.17 mg/dL (ref 0.61–1.24)
GFR, Estimated: >60 mL/min (ref >60)
Glucose, Bld: 118 mg/dL — ABNORMAL HIGH (ref 70–99)
Potassium: 3.8 mmol/L (ref 3.5–5.1)
Sodium: 134 mmol/L — ABNORMAL LOW (ref 135–145)

## 2021-04-09 MED ORDER — FUROSEMIDE 20 MG PO TABS
10.0000 mg | ORAL_TABLET | Freq: Every day | ORAL | Status: DC
  Administered 2021-04-09 – 2021-04-13 (×5): 10 mg via ORAL
  Filled 2021-04-09 (×5): qty 1

## 2021-04-09 NOTE — Progress Notes (Signed)
Occupational Therapy Session Note  Patient Details  Name: [name] MRN: [id number] Date of Birth: 05/06/1976  Today's Date: 04/09/2021 OT Individual Time: 1050-1115 OT Individual Time Calculation (min): 25 min    Short Term Goals: Week 2:  OT Short Term Goal 1 (Week 2): STGs = LTGs at Mod I  Skilled Therapeutic Interventions/Progress Updates:    Pt resting in w/c upon arrival. OT intervention with focus on safety awareness, DME, and discharge planning to prepare for discharge home 8/8. Discussed importance of safety at home, taking care of self, following dietary recommendations. Pt verbalized that he had "everything under control." Pt stated he was just waiting for Monday. Inquired about having an aide. Referred to CSW. Pt sated he already talked to CSW about this matter. Pt remained in w/c with belt alarm activated. Friends present and all needs within reach.  Therapy Documentation Precautions:  Precautions Precautions: Fall Precaution Comments: healing trach site, fall, LBP Restrictions Weight Bearing Restrictions: No  Pain:  Pt denies pain this morning   Therapy/Group: Individual Therapy  Leroy Libman 04/09/2021, 11:28 AM

## 2021-04-09 NOTE — Discharge Instructions (Addendum)
  Inpatient Rehab Discharge Instructions  Bruce Hunter Discharge date and time: 05/03/21   Activities/Precautions/ Functional Status: Activity: no lifting, driving, or strenuous exercise till cleared by MD Diet: diabetic diet--LOW SALT Wound Care: none needed   Functional status:  ___ No restrictions     ___ Walk up steps independently ___ 24/7 supervision/assistance   ___ Walk up steps with assistance _X__ Intermittent supervision/assistance  ___ Bathe/dress independently ___ Walk with walker     ___ Bathe/dress with assistance ___ Walk Independently    ___ Shower independently ___ Walk with assistance    ___ Shower with assistance _X__ No alcohol     ___ Return to work/school ________  Special Instructions: Ace wrap legs to keep swelling down. Elevate legs when seated. LOW SALT DIET WITH limited carbs/sweets.  3. TAKE YOUR MEDICATIONS AS PRESCRIBED. 4. Dr. Jonelle Sidle will be prescribing/refilling your pain medications. KEEP YOUR APPOINTMENTS.    COMMUNITY REFERRALS UPON DISCHARGE:   HOME EXERCISE PROGRAM CAN NOT FIND A HOME HEALTH AGENCY TO TAKE PATIENT'S MEDICAID  OUTPATIENT REHAB: [facility]  [facility] [place]    PHONE: [phone] WILL CALL PATIENT TO SET UP APPOINTMENT  [facility]: [phone]  PCS REFERRAL VIA [facility]-SHOULD BE IN CONTACT ON ONE WEEK -CALL PATIENT TO SET UP SERVICES [phone]   Medical Equipment/Items Ordered:wheelchair, 3 in 1 and tub seat                                                 Agency/Supplier:[facility]  [phone]   My questions have been answered and I understand these instructions. I will adhere to these goals and the provided educational materials after my discharge from the hospital.  Patient/Caregiver Signature _______________________________ Date __________  Clinician Signature _______________________________________ Date __________  Please bring this form and your medication list with you to all your  follow-up doctor's appointments.

## 2021-04-09 NOTE — Progress Notes (Signed)
Patient ID: Bruce Hunter, male   DOB: 1975/09/24, 45 y.o.   MRN: [id number] Pt is requesting aide services at home for home management. Contacted [employer] medicaid his provider who reports will need to get prior auth for. Have attempted to contact [facility] and they could not find the right person to talk with regarding this. Will try to get home health and see if can assist with PCS. Since pt has a managed care medicaid he will need to go through them to approve an aide. Will keep trying to see if can arrange this.

## 2021-04-09 NOTE — Progress Notes (Signed)
Physical Therapy Discharge Summary  Patient Details  Name: Bruce Hunter MRN: [id number] Date of Birth: December 07, 1975   Patient has met 6 of 6 long term goals due to improved activity tolerance, improved balance, improved postural control, increased strength, decreased pain, and improved coordination.  Patient to discharge at an ambulatory level Supervision-modified independent.   Patient's care partner unavailable to provide the necessary physical and cognitive assistance at discharge. Pt performing transfer mod I with RW, supervision for gait with RW. This therapist anticipates that pt will ambulate without AD in his home d/t poor safety awareness, despite being educated on fall risk. Pt requires supervision for short distance gait without AD and CGA for longer distances for safety. No family education was performed d/t pt discharging home alone.   Reasons goals not met: NA  Recommendation:  Patient will benefit from ongoing skilled PT services in home health setting to continue to advance safe functional mobility, address ongoing impairments in balance, strength, safety awareness, and minimize fall risk.  Equipment: RW and w/c  Reasons for discharge: treatment goals met and discharge from hospital  Patient/family agrees with progress made and goals achieved: Yes  PT Discharge Precautions/Restrictions Precautions Precautions: Fall Precaution Comments: healing trach site, fall, LBP, impulsive Restrictions Weight Bearing Restrictions: No Vision/Perception  Perception Perception: Within Functional Limits Praxis Praxis: Impaired Praxis Impairment Details: Motor planning  Cognition Overall Cognitive Status: Impaired/Different from baseline Arousal/Alertness: Awake/alert Attention: Alternating Alternating Attention: Impaired Alternating Attention Impairment: Verbal complex;Functional complex Memory: Impaired (improved with use of memory strategies) Memory Impairment: Retrieval  deficit;Decreased recall of new information (Patient endorsed decreased short-term memory at baseline) Decreased Short Term Memory: Verbal complex;Functional complex Awareness: Appears intact Problem Solving: Impaired (impaired higher level problem solving skills) Problem Solving Impairment: Verbal complex Executive Function: Self Correcting;Self Monitoring Self Correcting: Impaired Self Correcting Impairment: Verbal complex;Functional complex Behaviors: Restless;Impulsive;Poor frustration tolerance Safety/Judgment: Impaired Sensation Sensation Light Touch: Impaired by gross assessment (impaired BLEs in feet, hypersensitivity) Proprioception: Appears Intact Coordination Gross Motor Movements are Fluid and Coordinated: No Fine Motor Movements are Fluid and Coordinated: No Coordination and Movement Description: grossly uncoordinated d/t pain and deconditioning Heel Shin Test: [facility] Motor  Motor Motor: Abnormal postural alignment and control Motor - Discharge Observations: Uncoordinated and impulsive but improved from baseline  Mobility Bed Mobility Bed Mobility: Supine to Sit;Sit to Supine;Rolling Right;Rolling Left Rolling Right: Independent Rolling Left: Independent Supine to Sit: Independent Sit to Supine: Independent Transfers Transfers: Sit to Stand;Stand to Sit;Stand Pivot Transfers Sit to Stand: Independent with assistive device Stand to Sit: Independent with assistive device Stand Pivot Transfers: Independent with assistive device Transfer (Assistive device): Rolling walker Locomotion  Gait Ambulation: Yes Gait Assistance: Supervision/Verbal cueing Gait Distance (Feet): 300 Feet Assistive device: Rolling walker Gait Assistance Details: Verbal cues for precautions/safety Gait Gait: Yes Gait Pattern: Impaired Gait Pattern: Poor foot clearance - left;Poor foot clearance - right Gait velocity: decreased Stairs / Additional Locomotion Stairs: Yes Stairs Assistance:  Contact Guard/Touching assist Stair Management Technique: Two rails Number of Stairs: 12 Height of Stairs: 6 Ramp: Independent with assistive device Curb: Independent with assistive Production manager Mobility: Yes Wheelchair Assistance: Independent with Camera operator: Both upper extremities Wheelchair Parts Management: Independent Distance: 500+ ft  Trunk/Postural Assessment  Cervical Assessment Cervical Assessment: Within Functional Limits Thoracic Assessment Thoracic Assessment: Within Functional Limits Lumbar Assessment Lumbar Assessment: Within Functional Limits Postural Control Postural Control: Within Functional Limits  Balance Balance Balance Assessed: Yes Dynamic Sitting Balance Dynamic Sitting - Balance  Support: Feet supported Dynamic Sitting - Level of Assistance: 6: Modified independent (Device/Increase time) Static Standing Balance Static Standing - Balance Support: Bilateral upper extremity supported;During functional activity Static Standing - Level of Assistance: 6: Modified independent (Device/Increase time) Dynamic Standing Balance Dynamic Standing - Balance Support: No upper extremity supported;Bilateral upper extremity supported Dynamic Standing - Level of Assistance: 6: Modified independent (Device/Increase time) Extremity Assessment  RLE Assessment RLE Assessment: Exceptions to [facility] General Strength Comments: Grossly 5/5 throughout except DF 4-/5 LLE Assessment LLE Assessment: Exceptions to [facility] General Strength Comments: Grossly 5/5 throughout except DF 4-/5   Ailene Rud PT, DPT Una Yeomans L Shyam Dawson PT, DPT 04/11/2021, 1:49 PM

## 2021-04-09 NOTE — Progress Notes (Signed)
Speech Language Pathology Daily Session Note  Patient Details  Name: Bruce Hunter MRN: [id number] Date of Birth: [birth year]  Today's Date: 04/09/2021 SLP Individual Time: 1332-1415 SLP Individual Time Calculation (min): 43 min  Short Term Goals: Week 2: SLP Short Term Goal 1 (Week 2): STG=LTG (ELOS: discharge 8/8)  Skilled Therapeutic Interventions: Pt seen for skilled ST with focus on cognitive goals, pt sitting upright in wheelchair and pleasant with all therapeutic tasks. SLP administering SLUMS examination with pt score 27/30, 27+ considered to by [facility]. Pt only error in recall of word list (4 out of 5) and recall story elements (3 out of 4). Pt endorses this is is baseline cognition. SLP facilitating moderately complex problem solving for home environment with pt performance 85% accuracy with SPV level cues. Pt left in wheelchair with alarm belt on and all needs within reach. Cont ST POC.   Pain Pain Assessment Pain Scale: 0-10 Pain Score: 7  Pain Location: Foot  Therapy/Group: Individual Therapy  [name] 04/09/2021, 2:06 PM
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
## 2021-04-09 NOTE — Progress Notes (Signed)
Physical Therapy Session Note  Patient Details  Name: Bruce Hunter MRN: [id number] Date of Birth: 09-25-1975  Today's Date: 04/09/2021 PT Individual Time: 9476-5465 PT Individual Time Calculation (min): 60 min   Short Term Goals: Week 1:  PT Short Term Goal 1 (Week 1): Pt will initate gait training with LRAD PT Short Term Goal 1 - Progress (Week 1): Met PT Short Term Goal 2 (Week 1): Pt will tolerate x 30 minutes of activity PT Short Term Goal 2 - Progress (Week 1): Met PT Short Term Goal 3 (Week 1): Pt will tolerate sitting OOB for 30 + minutes PT Short Term Goal 3 - Progress (Week 1): Met  Skilled Therapeutic Interventions/Progress Updates:    Pt seated in w/c on arrival. Pt in foul mood this AM, when asked if he was ready for therapy stated "I ain't got no choice." Pt reminded that he does have a choice and is able to refuse. Pt agreed to therapy. Therapist donned ted hose, pt donned shoes with supervision. Pt used bathroom x2 at beginning of session and x 1 at end of session, mod I for toileting tasks. Pt propelled w/c with BUE to outdoor space for community integration and improved mood. Pt propelled w/c over various surfaces and grades. Pt also ambulated w/ RW and supervision x 10 min, including weaving through obstacles, navigating 4 stairs, grass, and long slope. Pt demoed slumped posture but could correct with frequent reminders. Also occ shuffling gait, pt reports this is his normal gait pattern. Pt returns to room and remained in w/c after session. Pt was left with all needs in reach and alarm active.   Therapy Documentation Precautions:  Precautions Precautions: Fall Precaution Comments: healing trach site, fall, LBP Restrictions Weight Bearing Restrictions: No   Therapy/Group: Individual Therapy  Mickel Fuchs 04/09/2021, 11:11 AM

## 2021-04-10 DIAGNOSIS — E1142 Type 2 diabetes mellitus with diabetic polyneuropathy: Secondary | ICD-10-CM

## 2021-04-10 LAB — GLUCOSE, CAPILLARY
Glucose-Capillary: 96 mg/dL (ref 70–99)
Glucose-Capillary: 137 mg/dL — ABNORMAL HIGH (ref 70–99)
Glucose-Capillary: 151 mg/dL — ABNORMAL HIGH (ref 70–99)
Glucose-Capillary: 147 mg/dL — ABNORMAL HIGH (ref 70–99)

## 2021-04-10 MED ORDER — GABAPENTIN 300 MG PO CAPS
300.0000 mg | ORAL_CAPSULE | Freq: Two times a day (BID) | ORAL | Status: DC
  Administered 2021-04-10 – 2021-04-17 (×14): 300 mg via ORAL
  Filled 2021-04-10 (×14): qty 1

## 2021-04-10 NOTE — Progress Notes (Signed)
PROGRESS NOTE   Subjective/Complaints: C/o low back pain and pain/swelling in feet. Perhaps a little better  ROS: Patient denies fever, rash, sore throat, blurred vision, nausea, vomiting, diarrhea, cough, shortness of breath or chest pain,  headache, or mood change.   Objective:   No results found. No results for input(s): WBC, HGB, HCT, PLT in the last 72 hours.   Recent Labs    04/08/21 0526 04/09/21 0740  NA 134* 134*  K 3.9 3.8  CL 101 100  CO2 23 24  GLUCOSE 162* 118*  BUN 34* 31*  CREATININE 1.47* 1.17  CALCIUM 9.3 9.0    Intake/Output Summary (Last 24 hours) at 04/10/2021 0957 Last data filed at 04/10/2021 0900 Gross per 24 hour  Intake 938 ml  Output --  Net 938 ml         Physical Exam: Vital Signs Blood pressure (!) 138/94, pulse (!) 105, temperature 98 F (36.7 C), temperature source Oral, resp. rate 18, height '5\' 10"'$  (1.778 m), weight 84.1 kg, SpO2 98 %. Constitutional: No distress . Vital signs reviewed. HEENT: NCAT, EOMI, oral membranes moist Neck: supple Cardiovascular: RRR without murmur. No JVD    Respiratory/Chest: CTA Bilaterally without wheezes or rales. Normal effort    GI/Abdomen: BS +, non-tender, non-distended Ext: no clubbing, cyanosis, 1-2+ pedal edema Psych: pleasant and cooperative  Genitourinary:    Comments: Foley in place- moderate amber urine in bag Musculoskeletal:    Comments: Bilateral feet  with 2+ edema left>right. Ues- deltoids and biceps 4-/5, Triceps 4-/5, WE/grip and finger abd 4+/5 all B/L LE's - HF 2+/5, KE 3-/5, PF 3-/5, DF 2/5 (Pain?) and PF 2/5 (pain?)- all B/L L shoulder- TTP on top/AC joint and RTC/posterior Skin:    General: Skin is warm and dry.    Comments: multiple scars on leg  Neurological:    Mental Status: He is alert and oriented to person, place, and time.    Comments: Appears decreased sensation to light touch in LE's from knees down, but pt  wasn't clear  - same as what was on admission- decreased to LT in LE's worse on LLE.    Assessment/Plan: 1. Functional deficits which require 3+ hours per day of interdisciplinary therapy in a comprehensive inpatient rehab setting. Physiatrist is providing close team supervision and 24 hour management of active medical problems listed below. Physiatrist and rehab team continue to assess barriers to discharge/monitor patient progress toward functional and medical goals  Care Tool:  Bathing    Body parts bathed by patient: Right arm, Left arm, Chest, Abdomen, Right upper leg, Left upper leg, Face, Front perineal area, Right lower leg, Left lower leg, Buttocks   Body parts bathed by helper: Buttocks, Right lower leg, Left lower leg     Bathing assist Assist Level: Set up assist     Upper Body Dressing/Undressing Upper body dressing   What is the patient wearing?: Pull over shirt    Upper body assist Assist Level: Independent with assistive device    Lower Body Dressing/Undressing Lower body dressing      What is the patient wearing?: Underwear/pull up, Pants     Lower body assist Assist  for lower body dressing: Independent with assitive device     Toileting Toileting    Toileting assist Assist for toileting: Independent with assistive device     Transfers Chair/bed transfer  Transfers assist     Chair/bed transfer assist level: Independent with assistive device Chair/bed transfer assistive device: Museum/gallery exhibitions officer assist      Assist level: Supervision/Verbal cueing Assistive device: Walker-rolling Max distance: 300 ft   Walk 10 feet activity   Assist     Assist level: Supervision/Verbal cueing Assistive device: Walker-rolling   Walk 50 feet activity   Assist Walk 50 feet with 2 turns activity did not occur: Safety/medical concerns  Assist level: Supervision/Verbal cueing Assistive device: Walker-rolling    Walk  150 feet activity   Assist Walk 150 feet activity did not occur: Safety/medical concerns  Assist level: Supervision/Verbal cueing Assistive device: Walker-rolling    Walk 10 feet on uneven surface  activity   Assist Walk 10 feet on uneven surfaces activity did not occur: Safety/medical concerns   Assist level: Supervision/Verbal cueing Assistive device: Aeronautical engineer Will patient use wheelchair at discharge?: Yes Type of Wheelchair: Manual Wheelchair activity did not occur: Refused (Pt stated he would not leave his room)  Wheelchair assist level: Independent Max wheelchair distance: 500 ft    Wheelchair 50 feet with 2 turns activity    Assist    Wheelchair 50 feet with 2 turns activity did not occur: Refused   Assist Level: Independent   Wheelchair 150 feet activity     Assist  Wheelchair 150 feet activity did not occur: Refused   Assist Level: Independent   Blood pressure (!) 138/94, pulse (!) 105, temperature 98 F (36.7 C), temperature source Oral, resp. rate 18, height '5\' 10"'$  (1.778 m), weight 84.1 kg, SpO2 98 %.  Medical Problem List and Plan: 1.  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 Maybe earlier- supervision to mod I- likely needs Mod I  -Continue CIR therapies including PT, OT, and SLP  2.  Impaired mobility, ambulating >200 feet: d/c Lovenox 3. Left shoulder/back/foot pain/Pain/diabetic peripheral neuropathy: continue Oxycodone prn. Increase steroids for gout flare. Will do 60 mg daily x 2 days, then taper. Pt rates pain as "11/10". Needs to bring in shoes so able to walk better; If steroids PO doesn't work, might need L shoulder injection. Pt hates needles.   7/27- pt reports tylenol helpful- con't regimen  8/1- added Lidoderm patches for Shoulder and back pain. 8pm to  8am  8/6 increase gabapentin to '300mg'$  TID 4. Mood: LCSW to follow for evaluation and support.              -antipsychotic agents: N/A 5. Neuropsych: This patient is capable of making decisions on his own behalf. 6. Skin/Wound Care: Routine pressure relief measures.  7. Fluids/Electrolytes/Nutrition: Strict I/O. Check CMET in am 8. PJP PNA:  Duration of meds discussed with pharmacy who relayed that patient has completed 21+ days of steroids and primaquine. --Clindamycin thorough 07/29 to complete 21 days. 9. Gout flare: Was on colchicine 0.6 mg daily PTA. --prednisone taper almost completed 10. T2DM: Hgb A1c- 5.4. Was on glucotrol/metformin bid PTA.             --  continue Lantus while on steroids with SSI for elevated BS.  7/27- BG's 94-159- good considering steroids- con't to monitor  8/6- BG's controlled, in spite of steroids- con't regimen 11. Anxiety d/o: Was on Klonopin TID PTA. --bid effective at this time. 12. HTN: Monitor BP tid--continue hydralazine and Norvasc.  7/27- BP slightly elevated- will monitor for trend.   8/6- BP under fair/borderline control 13. HIV: On Epivir and Tivicay 14. Anemia: H/H steadily improving. Recheck H/H in am.  7/27- Hb 8.9- still above a few days ago- con't to monitor  8/1- Hb back down to 7.9- has gone up and donw 1 unit- if still down next week, might need more intervention? 15. Hypomagnesemia: Was supplemented yesterday. --Recheck in am.  7/27- wasn't done/ordered- so will check in AM- last Mg 1.3- will give Magnesium based on labs in AM.   7/28- will add MgOx 400 mg BID and recheck qMonday.   8/1- pt refuses more labs except for 1x/week so cannot do IV_ will increase Mg, in spite of fact Mg still 1.3- like last week. This is a CHRONIC low Mg level, FYI.  16. Intermittent Leucocytosis: Monitor for fevers and other signs of infection.   7/27- WBC 10.3- will monitor  8/1- WBC 11.0- but no signs/Sx's of infection- afebrile 17. Dysphagia- improved-  now just passed to regular diet with thin liquids as of today. 18. Urinary retention- has failed at least 1 voiding trial on 7/20- on Flomax 0.4 mg nightly- will make sure has been on 7 days before trying another voiding trial.  7/29- Flomax dropped off when got here, so don't remove foley until at least 8/1- since restarted 7/28   voiding  19. Bowel incontinence- will see if can help pt become continent with improved function.   7/27- nursing got up to [facility] last night.  20. Chipped nails- will see if can help?  7/28- says it's better after Olin Hauser helping him.  21. GERD  87/28- will restart Protonix- also has tums prn. 22. Pressure injury to buttocks: offload q18mnutes 23. Bilateral lower extremity swelling: ice, elevated, compression garments 24. Shoulder pain: continue lidocaine patch and voltaren gel.  25. Nearsighted- schedule outpatient ophthalmology eval- should be checked for diabetic retinopathy 26. Diabetic peripheral neuropathy: will benefit from outpatient Qutenza. Ordered diabetic shoes  -gabapentin increased as above 27. Lower extremity edema:resumed '10mg'$  daily Lasix.  28. AKI: resolved, repeat Creatinine monday      [place]: [place]/02/2021, 9:57 AM

## 2021-04-10 NOTE — Progress Notes (Signed)
Speech Language Pathology Discharge Summary  Patient Details  Name: Bruce Hunter MRN: [id number] Date of Birth: October 10, 1975  Today's Date: 04/11/2021 SLP Individual Time: 7616-0737 SLP Individual Time Calculation (min): 42 min   Skilled Therapeutic Interventions:  Pt was seen for skilled ST targeting cognitive goals.  Upon arrival, pt was asleep in bed but awakened easily to voice and was agreeable to participating in treatment.  Pt verified that he will be discharging home alone.  SLP reinforced recommendations that pt have assistance for at least medication management at discharge in light of his tendency to complete tasks quickly which leads to careless errors at times.  Pt stated that he did not feel comfortable asking family or friends to help with managing his medications but he independently problem solved asking his pharmacy to organize his medications for him on a weekly basis.  SLP facilitated the session with a novel scheduling/deductive reasoning task targeting goals for problem solving goals.   Pt needed x1 initial supervision verbal cue to facilitate task organization but was able to complete the remainder of the task for 100% accuracy with mod I.  Pt was left sitting at edge of bed with bed alarm set.         Patient has met  3 of 4 long term goals.  Patient to discharge at overall Modified Independent;Supervision level.  Reasons goals not met: Patient at supervision level for complex problem solving   Clinical Impression/Discharge Summary: Patient has made functional gains and has met 3 of 4 long-term goals this admission. Patient is currently overall mod I for short-term memory with use of compensatory memory strategies, and supervision level for higher level cognitive tasks including complex problem solving and reasoning skills. Patient is currently tolerating regular diet and thin liquids and at independent level for implementation of safe swallow precautions. Education with patient  is complete. Patient is discharging home alone. Patient's care partner unavailable to provide assistance at discharge thus family education was not performed. Patient reported he has supportive neighbors who may be willing to provide assistance as needed. Follow-up SLP services are not indicated at this time.   Care Partner:  Caregiver Able to Provide Assistance: No    Recommendation:  None     Equipment: NA   Reasons for discharge: Treatment goals met;Discharged from [facility] 04/10/2021, 10:15 AM

## 2021-04-10 NOTE — Progress Notes (Signed)
Speech Language Pathology Daily Session Note  Patient Details  Name: Bruce Hunter MRN: [id number] Date of Birth: 06-28-1976  Today's Date: 04/10/2021 SLP Individual Time: [id number] SLP Individual Time Calculation (min): 42 min  Short Term Goals: Week 2: SLP Short Term Goal 1 (Week 2): STG=LTG (ELOS: discharge 8/8)  Skilled Therapeutic Interventions: Patient agreeable to skilled ST intervention with focus on cognitive goals. Patient appeared internally distracted by discomfort. Received pain medication shortly prior to session. SLP facilitated problem solving, reasoning, and error awareness skills during medication management task in which patient identified medication administration errors in BID pillbox. Completed task with sup A verbal and visual cues for accuracy. He initially completed task rather quickly with reduced self monitoring which impacted accuracy to 75%. However after initial cues to slow down patient demonstrated improved self monitoring and thoroughness resulting in improved accuracy to 100% at supervision level. Patient was left in bed with alarm activated and immediate needs within reach at end of session. Continue per current plan of care.      Pain Pain Assessment Pain Scale: 0-10 Pain Score: 10-Worst pain ever Pain Type: Acute pain Pain Location: Generalized Pain Descriptors / Indicators: Aching Pain Onset: On-going Pain Intervention(s): Medication (See eMAR);Emotional support  Therapy/Group: Individual Therapy  Yuleimy Kretz T Cauy Melody 04/10/2021, 8:15 AM

## 2021-04-10 NOTE — Plan of Care (Signed)
  Problem: Consults Goal: RH GENERAL PATIENT EDUCATION Description: See Patient Education module for education specifics. Outcome: Progressing Goal: Skin Care Protocol Initiated - if Braden Score 18 or less Description: If consults are not indicated, leave blank or document N/A Outcome: Progressing Goal: Diabetes Guidelines if Diabetic/Glucose > 140 Description: If diabetic or lab glucose is > 140 mg/dl - Initiate Diabetes/Hyperglycemia Guidelines & Document Interventions  Outcome: Progressing   Problem: RH BLADDER ELIMINATION Goal: RH STG MANAGE BLADDER WITH ASSISTANCE Description: STG Manage Bladder With Supervision Assistance Outcome: Progressing   Problem: RH SKIN INTEGRITY Goal: RH STG SKIN FREE OF INFECTION/BREAKDOWN Description: Skin to remain free from additional breakdown while on CIR with Supervision Assistance. Outcome: Progressing Goal: RH STG ABLE TO PERFORM INCISION/WOUND CARE W/ASSISTANCE Description: STG Able To Perform Incision/Wound Care With Supervision Assistance. Outcome: Progressing   Problem: RH SAFETY Goal: RH STG ADHERE TO SAFETY PRECAUTIONS W/ASSISTANCE/DEVICE Description: STG Adhere to Safety Precautions With cues and reminders. Outcome: Progressing   Problem: RH PAIN MANAGEMENT Goal: RH STG PAIN MANAGED AT OR BELOW PT'S PAIN GOAL Description: < 4 on a 0-10 pain scale. Outcome: Progressing   Problem: RH KNOWLEDGE DEFICIT GENERAL Goal: RH STG INCREASE KNOWLEDGE OF SELF CARE AFTER HOSPITALIZATION Description: Patient will demonstrate knowledge of medication management, pain management, skin/wound care with educational materials and handouts provided by staff independently at discharge. Outcome: Progressing   Problem: RH BOWEL ELIMINATION Goal: RH STG MANAGE BOWEL WITH ASSISTANCE Description: STG Manage Bowel with Assistance. Outcome: Progressing

## 2021-04-10 NOTE — Progress Notes (Addendum)
Occupational Therapy Session Note  Patient Details  Name: Bruce Hunter MRN: [id number] Date of Birth: 02-06-1976  Today's Date: 04/11/2021 OT Individual Time: 1445-1530 Individual Treatment Time Calculation: 45 min  Skilled Therapeutic Interventions/Progress Updates:    Pt greeted in the w/c, opting to not participate in BADLs during tx. He did doff/don Ted hose at [employer] I level using adaptive bag method for OT goal achievement. Afterwards pt self propelled w/c down to [employer], pt opting to self propel vs ambulate due to foot pain. After we discussed his diabetic diet, pt ordered his food while standing at the counter. He then self propelled w/c back to the room for UB strengthening/endurance. Discussed his OT goal achievement. Pt proud of his achievements at [facility], motivated to go home tomorrow, no further OT related questions concerning d/c.     RN okay'd trip to [place] during session  Therapy Documentation Precautions:  Precautions Precautions: Fall Precaution Comments: healing trach site, fall, LBP, impulsive Restrictions Weight Bearing Restrictions: No  Pain: in feet, he reported he would notify RN when OT left that he wanted some pain medicine Pain Assessment Pain Score: 9  ADL: ADL Eating: Independent Grooming: Independent Where Assessed-Grooming: Sitting at sink Upper Body Bathing: Modified independent Where Assessed-Upper Body Bathing: Sitting at sink Lower Body Bathing: Modified independent Where Assessed-Lower Body Bathing: Sitting at sink, Standing at sink Upper Body Dressing: Modified independent (Device) Where Assessed-Upper Body Dressing: Standing at sink Lower Body Dressing: Modified independent Where Assessed-Lower Body Dressing: Sitting at sink, Standing at sink Toileting: Modified independent Toilet Transfer: Modified independent Tub/Shower Transfer: Close supervison   Therapy/Group: Individual Therapy  [name] [name] 04/11/2021, 3:56 PM

## 2021-04-11 LAB — GLUCOSE, CAPILLARY
Glucose-Capillary: 134 mg/dL — ABNORMAL HIGH (ref 70–99)
Glucose-Capillary: 116 mg/dL — ABNORMAL HIGH (ref 70–99)
Glucose-Capillary: 141 mg/dL — ABNORMAL HIGH (ref 70–99)
Glucose-Capillary: 122 mg/dL — ABNORMAL HIGH (ref 70–99)

## 2021-04-11 MED ORDER — PREDNISONE 20 MG PO TABS
20.0000 mg | ORAL_TABLET | Freq: Once | ORAL | Status: AC
  Administered 2021-04-11: 20 mg via ORAL
  Filled 2021-04-11: qty 1

## 2021-04-11 NOTE — Progress Notes (Signed)
Physical Therapy Session Note  Patient Details  Name: Bruce Hunter MRN: [id number] Date of Birth: 04-07-1976  Today's Date: 04/11/2021 PT Individual Time: 1300-1345 PT Individual Time Calculation (min): 45 min   Short Term Goals: Week 2:  PT Short Term Goal 1 (Week 2): =LTGs d/t ELOS  Skilled Therapeutic Interventions/Progress Updates:     Patient in the bathroom upon PT arrival. Patient alert and agreeable to PT session. Patient reported 5/10 B foot pain with edema during session, RN made aware. PT provided repositioning, rest breaks, and distraction as pain interventions throughout session.   Patient donned slide on tennis shoes with mod I for increased time and use of comb as shoe horn prior to mobility.   Therapeutic Activity: Bed Mobility: Patient performed rolling R/L and supine to/from sit independently in ADL bed to simulate home set-up.  Transfers: Patient performed sit to/from stand from [facility], RW, ADL bed, and ADL couch with mod I using RW.  Patient performed a simulated sedan height car transfer with mod I using RW and ambulating 10 feet to from car to simulate home set-up. Patient able to fold/unfold and load/unload RW in/out of car without assist during transfer.  Gait Training:  Patient ambulated >150 an >100 feet using RW with supervision and 25-50 feet in ADL apartment and the room mod I with RW. Ambulated with decreased gait speed, decreased step length and height, decreased DF L>R, forward trunk lean, and intermittent downward head gaze. Provided verbal cues for increased foot clearance and safety awareness with fatigue. Patient ambulated up/down a ramp, over 10 feet of mulch (unlevel surface), and up/down a curb to simulate community ambulation over unlevel surfaces with supervision using RW. Provided min cues for technique and use of AD. Patient ascended/descended 12x6" steps using R rail to simulate home set-up with supervision. Performed reciprocal gait pattern while  ascending and step-to gait pattern while descending. Provided min cues for technique and sequencing.   Wheelchair Mobility:  Patient propelled wheelchair >150 feet with mod I.   Educated patient on fall risk/prevention, home modifications to prevent falls, and activation of emergency services in the event of a fall during session.   Patient in w/c in the room at end of session with breaks locked and all needs within reach. Made patient mod I in the room for mobility and transfers, no showering at this time. Educated on proper footwear and appropriate times to call for assist. Patient stated understanding, RN made aware, and sign posted on patient's door.   Therapy Documentation Precautions:  Precautions Precautions: Fall Precaution Comments: healing trach site, fall, LBP, impulsive Restrictions Weight Bearing Restrictions: No    Therapy/Group: Individual Therapy  Reika Callanan L Andie Mungin PT, DPT  04/11/2021, 3:15 PM

## 2021-04-11 NOTE — Plan of Care (Signed)
  Problem: RH Simple Meal Prep Goal: LTG Patient will perform simple meal prep w/assist (OT) Description: LTG: Patient will perform simple meal prep with assistance, with/without cues (OT). Outcome: Not Met (add Reason) Note: Goal not met as pt still requires supervision for IADLS for safety   Problem: RH Balance Goal: LTG Patient will maintain dynamic standing with ADLs (OT) Description: LTG:  Patient will maintain dynamic standing balance with assist during activities of daily living (OT)  Outcome: Completed/Met   Problem: Sit to Stand Goal: LTG:  Patient will perform sit to stand in prep for activites of daily living with assistance level (OT) Description: LTG:  Patient will perform sit to stand in prep for activites of daily living with assistance level (OT) Outcome: Completed/Met   Problem: RH Grooming Goal: LTG Patient will perform grooming w/assist,cues/equip (OT) Description: LTG: Patient will perform grooming with assist, with/without cues using equipment (OT) Outcome: Completed/Met   Problem: RH Bathing Goal: LTG Patient will bathe all body parts with assist levels (OT) Description: LTG: Patient will bathe all body parts with assist levels (OT) Outcome: Completed/Met   Problem: RH Dressing Goal: LTG Patient will perform upper body dressing (OT) Description: LTG Patient will perform upper body dressing with assist, with/without cues (OT). Outcome: Completed/Met Goal: LTG Patient will perform lower body dressing w/assist (OT) Description: LTG: Patient will perform lower body dressing with assist, with/without cues in positioning using equipment (OT) Outcome: Completed/Met   Problem: RH Toileting Goal: LTG Patient will perform toileting task (3/3 steps) with assistance level (OT) Description: LTG: Patient will perform toileting task (3/3 steps) with assistance level (OT)  Outcome: Completed/Met   Problem: RH Toilet Transfers Goal: LTG Patient will perform toilet  transfers w/assist (OT) Description: LTG: Patient will perform toilet transfers with assist, with/without cues using equipment (OT) Outcome: Completed/Met   Problem: RH Tub/Shower Transfers Goal: LTG Patient will perform tub/shower transfers w/assist (OT) Description: LTG: Patient will perform tub/shower transfers with assist, with/without cues using equipment (OT) Outcome: Completed/Met

## 2021-04-11 NOTE — Plan of Care (Signed)
  Problem: RH BLADDER ELIMINATION Goal: RH STG MANAGE BLADDER WITH ASSISTANCE Description: STG Manage Bladder With Supervision Assistance Outcome: Completed/Met   Problem: RH SKIN INTEGRITY Goal: RH STG SKIN FREE OF INFECTION/BREAKDOWN Description: Skin to remain free from additional breakdown while on CIR with Supervision Assistance. Outcome: Completed/Met   Problem: RH SAFETY Goal: RH STG ADHERE TO SAFETY PRECAUTIONS W/ASSISTANCE/DEVICE Description: STG Adhere to Safety Precautions With cues and reminders. Outcome: Completed/Met   Problem: RH KNOWLEDGE DEFICIT GENERAL Goal: RH STG INCREASE KNOWLEDGE OF SELF CARE AFTER HOSPITALIZATION Description: Patient will demonstrate knowledge of medication management, pain management, skin/wound care with educational materials and handouts provided by staff independently at discharge. Outcome: Completed/Met   Problem: RH BOWEL ELIMINATION Goal: RH STG MANAGE BOWEL WITH ASSISTANCE Description: STG Manage Bowel with Assistance. Outcome: Completed/Met

## 2021-04-12 ENCOUNTER — Other Ambulatory Visit (HOSPITAL_COMMUNITY): Payer: Self-pay

## 2021-04-12 LAB — BASIC METABOLIC PANEL
Anion gap: 9 (ref 5–15)
BUN: 33 mg/dL — ABNORMAL HIGH (ref 6–20)
CO2: 24 mmol/L (ref 22–32)
Calcium: 8.6 mg/dL — ABNORMAL LOW (ref 8.9–10.3)
Chloride: 104 mmol/L (ref 98–111)
Creatinine, Ser: 1.38 mg/dL — ABNORMAL HIGH (ref 0.61–1.24)
GFR, Estimated: >60 mL/min (ref >60)
Glucose, Bld: 137 mg/dL — ABNORMAL HIGH (ref 70–99)
Potassium: 3.8 mmol/L (ref 3.5–5.1)
Sodium: 137 mmol/L (ref 135–145)

## 2021-04-12 LAB — FERRITIN: Ferritin: 866 ng/mL — ABNORMAL HIGH (ref 24–336)

## 2021-04-12 LAB — GLUCOSE, CAPILLARY
Glucose-Capillary: 149 mg/dL — ABNORMAL HIGH (ref 70–99)
Glucose-Capillary: 105 mg/dL — ABNORMAL HIGH (ref 70–99)
Glucose-Capillary: 158 mg/dL — ABNORMAL HIGH (ref 70–99)
Glucose-Capillary: 130 mg/dL — ABNORMAL HIGH (ref 70–99)

## 2021-04-12 LAB — IRON AND TIBC
Iron: 18 ug/dL — ABNORMAL LOW (ref 45–182)
Saturation Ratios: 7 % — ABNORMAL LOW (ref 17.9–39.5)
TIBC: 270 ug/dL (ref 250–450)
UIBC: 252 ug/dL

## 2021-04-12 LAB — MAGNESIUM: Magnesium: 1.1 mg/dL — ABNORMAL LOW (ref 1.7–2.4)

## 2021-04-12 LAB — URIC ACID: Uric Acid, Serum: 8.3 mg/dL (ref 3.7–8.6)

## 2021-04-12 LAB — ALBUMIN: Albumin: 2.8 g/dL — ABNORMAL LOW (ref 3.5–5.0)

## 2021-04-12 MED ORDER — MAGNESIUM SULFATE 2 GM/50ML IV SOLN
2.0000 g | Freq: Once | INTRAVENOUS | Status: AC
  Administered 2021-04-12: 2 g via INTRAVENOUS
  Filled 2021-04-12: qty 50

## 2021-04-12 MED ORDER — DOVATO 50-300 MG PO TABS
1.0000 | ORAL_TABLET | Freq: Every day | ORAL | 0 refills | Status: DC
  Filled 2021-04-12: qty 30, 30d supply, fill #0

## 2021-04-12 MED ORDER — POTASSIUM CHLORIDE CRYS ER 20 MEQ PO TBCR
20.0000 meq | EXTENDED_RELEASE_TABLET | Freq: Two times a day (BID) | ORAL | Status: AC
  Administered 2021-04-12 – 2021-04-13 (×2): 20 meq via ORAL
  Filled 2021-04-12 (×2): qty 1

## 2021-04-12 MED ORDER — FUROSEMIDE 10 MG/ML IJ SOLN
80.0000 mg | Freq: Once | INTRAMUSCULAR | Status: AC
  Administered 2021-04-12: 80 mg via INTRAVENOUS
  Filled 2021-04-12: qty 8

## 2021-04-12 MED ORDER — DAPSONE 100 MG PO TABS
100.0000 mg | ORAL_TABLET | Freq: Every day | ORAL | 0 refills | Status: DC
  Filled 2021-04-12: qty 30, 30d supply, fill #0

## 2021-04-12 MED ORDER — FUROSEMIDE 10 MG/ML IJ SOLN: 60.0000 mg | Freq: Once | INTRAMUSCULAR | Status: DC

## 2021-04-12 MED ORDER — FUROSEMIDE 10 MG/ML IJ SOLN
80.0000 mg | Freq: Once | INTRAMUSCULAR | Status: AC
  Administered 2021-04-13: 80 mg via INTRAVENOUS
  Filled 2021-04-12: qty 8

## 2021-04-12 NOTE — Progress Notes (Signed)
Patient ID: Bruce Hunter, male   DOB: 13-Jan-1976, 45 y.o.   MRN: [id number] Pt not discharging today due to medical issues. Team aware.

## 2021-04-12 NOTE — Progress Notes (Signed)
Pt sitting on side of bed with increase in pulse rate patient alert and oriented x4 pt c/o pain 10/10 in lower back patient given tylenol for c/o pain in the interim between oxycodone doses. This nurse notified Olin Hauser love PA-c and received orders to recheck pulse manually in 30 minutes. Yellow mews protocol initiated.   04/12/21 1958  Assess: MEWS Score  Temp 99.3 F (37.4 C)  BP (!) 139/92  Pulse Rate (!) 119  Resp 18  Level of Consciousness Alert  SpO2 97 %  O2 Device Room Air  Patient Activity (if Appropriate) In bed  Assess: MEWS Score  MEWS Temp 0  MEWS Systolic 0  MEWS Pulse 2  MEWS RR 0  MEWS LOC 0  MEWS Score 2  MEWS Score Color Yellow  Assess: if the MEWS score is Yellow or Red  Were vital signs taken at a resting state? Yes  Focused Assessment Change from prior assessment (see assessment flowsheet)  Does the patient meet 2 or more of the SIRS criteria? Yes  Does the patient have a confirmed or suspected source of infection? No  Provider and Rapid Response Notified? No  Early Detection of Sepsis Score *See Row Information* Low  MEWS guidelines implemented *See Row Information* Yes  Take Vital Signs  Increase Vital Sign Frequency  Yellow: Q 2hr X 2 then Q 4hr X 2, if remains yellow, continue Q 4hrs  Escalate  MEWS: Escalate Yellow: discuss with charge nurse/RN and consider discussing with provider and RRT  Notify: Charge Nurse/RN  Name of Charge Nurse/RN Notified Coralyn Mark RN  Date Charge Nurse/RN Notified 04/12/21  Notify: Provider  Provider Name/Title pamela love  Date Provider Notified 04/12/21  Time Provider Notified 1802  Notification Type Call  Notification Reason Change in status  Provider response No new orders  Date of Provider Response 04/12/21  Time of Provider Response [id number]  Document  Patient Outcome Stabilized after interventions

## 2021-04-12 NOTE — Progress Notes (Signed)
PROGRESS NOTE   Subjective/Complaints:  Bruce Hunter reports mother coming to get him- went over plan for d/c- to pick up 9-11 am- no issues- ready for d/c.    ROS:  Bruce Hunter denies SOB, abd pain, CP, N/V/C/D, and vision changes   Objective:   No results found. No results for input(s): WBC, HGB, HCT, PLT in the last 72 hours.   Recent Labs    04/12/21 0644  NA 137  K 3.8  CL 104  CO2 24  GLUCOSE 137*  BUN 33*  CREATININE 1.38*  CALCIUM 8.6*    Intake/Output Summary (Last 24 hours) at 04/12/2021 0850 Last data filed at 04/12/2021 0700 Gross per 24 hour  Intake 700 ml  Output --  Net 700 ml         Physical Exam: Vital Signs Blood pressure 134/86, pulse 93, temperature 98.1 F (36.7 C), resp. rate 18, height '5\' 10"'$  (1.778 m), weight 84.1 kg, SpO2 98 %.   General: awake, alert, appropriate, sitting up in w/c at sink grooming; NAD HENT: conjugate gaze; oropharynx moist CV: regular rate; no JVD Pulmonary: CTA B/L; no W/R/R- good air movement GI: soft, NT, ND, (+)BS Psychiatric: appropriate; less irritable.  Neurological: alert  Ext: no clubbing, cyanosis, 1-2+ pedal edema Psych: pleasant and cooperative  Musculoskeletal:    Comments: Bilateral feet  with 2+ edema left>right. Ues- deltoids and biceps 4-/5, Triceps 4-/5, WE/grip and finger abd 4+/5 all B/L LE's - HF 2+/5, KE 3-/5, PF 3-/5, DF 2/5 (Pain?) and PF 2/5 (pain?)- all B/L L shoulder- TTP on top/AC joint and RTC/posterior Skin:    General: Skin is warm and dry.    Comments: multiple scars on leg  Neurological:    Mental Status: He is alert and oriented to person, place, and time.    Comments: Appears decreased sensation to light touch in LE's from knees down, but Bruce Hunter wasn't clear  - same as what was on admission- decreased to LT in LE's worse on LLE.    Assessment/Plan: 1. Functional deficits which require 3+ hours per day of interdisciplinary therapy in a  comprehensive inpatient rehab setting. Physiatrist is providing close team supervision and 24 hour management of active medical problems listed below. Physiatrist and rehab team continue to assess barriers to discharge/monitor patient progress toward functional and medical goals  Care Tool:  Bathing    Body parts bathed by patient: Right arm, Left arm, Chest, Abdomen, Right upper leg, Left upper leg, Face, Front perineal area, Right lower leg, Left lower leg, Buttocks   Body parts bathed by helper: Buttocks, Right lower leg, Left lower leg     Bathing assist Assist Level: Set up assist (per most recent staff documentation)     Upper Body Dressing/Undressing Upper body dressing   What is the patient wearing?: Pull over shirt    Upper body assist Assist Level: Independent with assistive device (per most recent staff documentation)    Lower Body Dressing/Undressing Lower body dressing      What is the patient wearing?: Underwear/pull up, Pants     Lower body assist Assist for lower body dressing: Independent with assitive device (per most recent staff documentation)  Toileting Toileting    Toileting assist Assist for toileting: Independent with assistive device     Transfers Chair/bed transfer  Transfers assist     Chair/bed transfer assist level: Independent with assistive device Chair/bed transfer assistive device: Museum/gallery exhibitions officer assist      Assist level: Supervision/Verbal cueing Assistive device: Walker-rolling Max distance: 300 ft   Walk 10 feet activity   Assist     Assist level: Independent with assistive device Assistive device: Walker-rolling   Walk 50 feet activity   Assist Walk 50 feet with 2 turns activity did not occur: Safety/medical concerns  Assist level: Independent with assistive device Assistive device: Walker-rolling    Walk 150 feet activity   Assist Walk 150 feet activity did not occur:  Safety/medical concerns  Assist level: Supervision/Verbal cueing Assistive device: Walker-rolling    Walk 10 feet on uneven surface  activity   Assist Walk 10 feet on uneven surfaces activity did not occur: Safety/medical concerns   Assist level: Supervision/Verbal cueing Assistive device: Aeronautical engineer Will patient use wheelchair at discharge?: Yes Type of Wheelchair: Manual Wheelchair activity did not occur: Refused (Bruce Hunter stated he would not leave his room)  Wheelchair assist level: Independent Max wheelchair distance: 500 ft    Wheelchair 50 feet with 2 turns activity    Assist    Wheelchair 50 feet with 2 turns activity did not occur: Refused   Assist Level: Independent   Wheelchair 150 feet activity     Assist  Wheelchair 150 feet activity did not occur: Refused   Assist Level: Independent   Blood pressure 134/86, pulse 93, temperature 98.1 F (36.7 C), resp. rate 18, height '5\' 10"'$  (1.778 m), weight 84.1 kg, SpO2 98 %.  Medical Problem List and Plan: 1.  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 Maybe earlier- supervision to mod I- likely needs Mod I  -Continue CIR therapies including Bruce Hunter, OT, and SLP  8/8- d/c today- is mod I in room 2.  Impaired mobility, ambulating >200 feet: d/c Lovenox 3. Left shoulder/back/foot pain/Pain/diabetic peripheral neuropathy: continue Oxycodone prn. Increase steroids for gout flare. Will do 60 mg daily x 2 days, then taper. Bruce Hunter rates pain as "11/10". Needs to bring in shoes so able to walk better; If steroids PO doesn't work, might need L shoulder injection. Bruce Hunter hates needles.   7/27- Bruce Hunter reports tylenol helpful- con't regimen  8/1- added Lidoderm patches for Shoulder and back pain. 8pm to 8am  8/6 increase gabapentin to '300mg'$  TID 4. Mood: LCSW to follow for  evaluation and support.              -antipsychotic agents: N/A 5. Neuropsych: This patient is capable of making decisions on his own behalf. 6. Skin/Wound Care: Routine pressure relief measures.  7. Fluids/Electrolytes/Nutrition: Strict I/O. Check CMET in am 8. PJP PNA:  Duration of meds discussed with pharmacy who relayed that patient has completed 21+ days of steroids and primaquine. --Clindamycin thorough 07/29 to complete 21 days. 9. Gout flare: Was on colchicine 0.6 mg daily PTA. --prednisone taper almost completed 10. T2DM: Hgb A1c- 5.4. Was on glucotrol/metformin bid PTA.             --  continue Lantus while on steroids with SSI for elevated BS.  7/27- BG's 94-159- good considering steroids- con't to monitor  8/6- BG's controlled, in spite of steroids- con't regimen 11. Anxiety d/o: Was on Klonopin TID PTA. --bid effective at this time. 12. HTN: Monitor BP tid--continue hydralazine and Norvasc.  7/27- BP slightly elevated- will monitor for trend.   8/6- BP under fair/borderline control 13. HIV: On Epivir and Tivicay 14. Anemia: H/H steadily improving. Recheck H/H in am.  7/27- Hb 8.9- still above a few days ago- con't to monitor  8/1- Hb back down to 7.9- has gone up and donw 1 unit- if still down next week, might need more intervention? 15. Hypomagnesemia: Was supplemented yesterday. --Recheck in am.  7/27- wasn't done/ordered- so will check in AM- last Mg 1.3- will give Magnesium based on labs in AM.   7/28- will add MgOx 400 mg BID and recheck qMonday.   8/1- Bruce Hunter refuses more labs except for 1x/week so cannot do IV_ will increase Mg, in spite of fact Mg still 1.3- like last week. This is a CHRONIC low Mg level, FYI.   8/8- Mg 1.1- has to go home on PO Mg. Will see if can convince him to get IV Mg.  16. Intermittent Leucocytosis: Monitor for fevers and other signs of infection.   7/27- WBC 10.3- will monitor  8/1- WBC 11.0- but no signs/Sx's of infection- afebrile 17.  Dysphagia- improved- now just passed to regular diet with thin liquids as of today. 18. Urinary retention- has failed at least 1 voiding trial on 7/20- on Flomax 0.4 mg nightly- will make sure has been on 7 days before trying another voiding trial.  7/29- Flomax dropped off when got here, so don't remove foley until at least 8/1- since restarted 7/28   voiding   8/8- is voiding- foley out.  19. Bowel incontinence- will see if can help Bruce Hunter become continent with improved function.   7/27- nursing got up to [facility] last night.  20. Chipped nails- will see if can help?  7/28- says it's better after Olin Hauser helping him.  21. GERD  87/28- will restart Protonix- also has tums prn. 22. Pressure injury to buttocks: offload q33mnutes 23. Bilateral lower extremity swelling: ice, elevated, compression garments 24. Shoulder pain: continue lidocaine patch and voltaren gel.  25. Nearsighted- schedule outpatient ophthalmology eval- should be checked for diabetic retinopathy 26. Diabetic peripheral neuropathy: will benefit from outpatient Qutenza. Ordered diabetic shoes  -gabapentin increased as above 27. Lower extremity edema:resumed '10mg'$  daily Lasix.  28. AKI:   8/8- Cr 1.38- back and forth- from 1.07 to 1.40- could be Septra??? But will let him f/u with ID/or get PCP.       LOS: 13 days A FACE TO FACE EVALUATION WAS PERFORMED  Yeily Link 04/12/2021, 8:50 AM

## 2021-04-12 NOTE — Progress Notes (Addendum)
Patient ID: Bruce Hunter, male   DOB: 1976/05/25, 44 y.o.   MRN: [id number] Still waiting for Home health to get back with worker regarding accepting pt for follow up.  9:43 AM Home health declined to accept pt and have not found any home health agency to accept his Medicaid. Pt made aware of this. Continue to try to get him a PCS adie via [facility] but pt also needs to call.

## 2021-04-12 NOTE — Progress Notes (Signed)
Inpatient Rehabilitation Care Coordinator Discharge Note  The overall goal for the admission was met for:   Discharge location: Yes-HOME WITH INTERMITTENT ASSIST FROM FAMILY AND FRIENDS  Length of Stay: Yes-13 DAYS  Discharge activity level: Yes-MOD/I-SUPERVISION LEVEL  Home/community participation: Yes  Services provided included: MD, RD, PT, OT, SLP, RN, CM, Pharmacy, and SW  Financial Services: Medicaid  Choices offered to/list presented to:PT AND FAMILY  Follow-up services arranged: DME: [facility], 3 IN 1, [facility] and Patient/Family has no preference for HH/DME agencies [place]  Comments (or additional information):MOM AND FRIEND [place], [place]  Patient/Family verbalized understanding of follow-up arrangements: Yes  Individual responsible for coordination of the follow-up plan: SELF [phone]  Confirmed correct DME delivered: Elease Hashimoto 04/12/2021    Keldrick Pomplun, Gardiner Rhyme

## 2021-04-12 NOTE — Progress Notes (Signed)
Nephrology Progress Note:   Subjective: nephrology signed off on 03/25/21.  He has been transitioned to rehab at [facility].   Nephrology called today with swelling.  He was supposed to go home today but states so swollen can't get around.  Strict ins/outs not available.  Per MAR hx he got a 40 mg dose of PO lasix once on 8/3 then was transitioned to lasix 10 mg daily.  Reports his toes are so swollen it's hard to move his feet.  Trach decannulated.  He states that he used to take HCTZ at home.   Review of systems:   Denies shortness of breath  Decreased mobility Denies n/v Denies cp  Back hurts Feet are swollen and hurt  Objective Vital signs in last 24 hours: Vitals:   04/11/21 1227 04/11/21 2001 04/12/21 0347 04/12/21 1334  BP: (!) 146/92 134/88 134/86 (!) 146/92  Pulse: (!) 103 (!) 108 93 (!) 110  Resp: '16 18 18 18  '$ Temp: 99.1 F (37.3 C) 99.1 F (37.3 C) 98.1 F (36.7 C) 99 F (37.2 C)  TempSrc: Oral   Oral  SpO2: 98% 98% 98% 100%  Weight:      Height:       Weight change:   Intake/Output Summary (Last 24 hours) at 04/12/2021 1440 Last data filed at 04/12/2021 1354 Gross per 24 hour  Intake 720 ml  Output --  Net 720 ml    Assessment/ Plan: Pt is a 45 y.o. yo male with HIV/AIDS, DM, baseline CKD who was admitted on 03/30/2021 with acute hypoxic resp failure with sepsis and AKI.  New diagnosis HIV and with PJP PNA during acute hospitalization now s/p course of abx for same   Assessment/Plan: 1. Hx AKI  AKI in the setting of hypoxic resp failure, ATN with septic shock and also urinary retention.  Off and on CRRT-  most recently stopped on 7/15 and temp HD catheter out.  Note 02/2020 Cr 2.80 and 05/2011 Cr 0.90 - only prior labs in [facility] system.  reported baseline crt in the high 2's however this was based on limited data and may have just been reflective of prior AKI episode.   - Lasix 80 mg IV once now and repeat in AM - strict ins/outs ordered - BMP in AM  2. Chronic hypoxic  resp failure s/p trach - now decannulated  3. HIV - regimen per primary team/ID  4. HTN - above goal and with overload; for diuresis as above   5. Anemia normocytic - no acute need for PRBC's or ESA.  Check iron stores. CBC in AM  6. Impaired mobility - per CIR team  7.  Urinary retention-  continue flomax and note foley is removed   8. Hypomagnesemia  - replete   Labs: Basic Metabolic Panel: Recent Labs  Lab 04/08/21 0526 04/09/21 0740 04/12/21 0644  NA 134* 134* 137  K 3.9 3.8 3.8  CL 101 100 104  CO2 '23 24 24  '$ GLUCOSE 162* 118* 137*  BUN 34* 31* 33*  CREATININE 1.47* 1.17 1.38*  CALCIUM 9.3 9.0 8.6*   Liver Function Tests: No results for input(s): AST, ALT, ALKPHOS, BILITOT, PROT, ALBUMIN in the last 168 hours.  No results for input(s): LIPASE, AMYLASE in the last 168 hours. No results for input(s): AMMONIA in the last 168 hours. CBC: No results for input(s): WBC, NEUTROABS, HGB, HCT, MCV, PLT in the last 168 hours.  Cardiac Enzymes: No results for input(s): CKTOTAL, CKMB, CKMBINDEX, TROPONINI in  the last 168 hours. CBG: Recent Labs  Lab 04/11/21 1123 04/11/21 1616 04/11/21 2136 04/12/21 0545 04/12/21 1127  GLUCAP 116* 141* 122* 149* 105*    Iron Studies:  No results for input(s): IRON, TIBC, TRANSFERRIN, FERRITIN in the last 72 hours.  Studies/Results: No results found. Medications: Infusions:    Scheduled Medications:  (feeding supplement) PROSource Plus  30 mL Oral TID BM   amLODipine  10 mg Oral Daily   clonazePAM  0.5 mg Oral TID   dapsone  100 mg Oral Daily   diclofenac Sodium  2 g Topical QID   dolutegravir  50 mg Oral Daily   furosemide  10 mg Oral Daily   gabapentin  300 mg Oral Q12H   hydrALAZINE  25 mg Oral Q8H   insulin aspart  0-15 Units Subcutaneous TID WC   insulin aspart  0-5 Units Subcutaneous QHS   insulin glargine-yfgn  5 Units Subcutaneous Daily   lamiVUDine  300 mg Oral Daily   lidocaine  1 patch Transdermal Q24H    lidocaine  2 patch Transdermal Q24H   magnesium oxide  400 mg Oral BID   multivitamin with minerals  1 tablet Oral Daily   pantoprazole  40 mg Oral Daily   potassium chloride  20 mEq Oral BID   predniSONE  10 mg Oral Q breakfast   Followed by   Derrill Memo ON 04/14/2021] predniSONE  5 mg Oral Q breakfast   tamsulosin  0.4 mg Oral QPC supper    have reviewed scheduled and prn medications.  Physical Exam:  General adult male in bed in no acute distress HEENT normocephalic atraumatic extraocular movements intact sclera anicteric Neck supple trachea midline; bandage over old trach site Lungs clear to auscultation bilaterally normal work of breathing at rest  Heart S1S2 no rub Abdomen soft nontender nondistended Extremities 2-3+ edema  Psych normal mood and affect Neuro - awake and conversant; follows commands and provides hx   Claudia Desanctis, MD 04/12/2021,3:19 PM

## 2021-04-13 ENCOUNTER — Other Ambulatory Visit (HOSPITAL_COMMUNITY): Payer: Self-pay

## 2021-04-13 ENCOUNTER — Inpatient Hospital Stay (HOSPITAL_COMMUNITY): Payer: Medicaid Other

## 2021-04-13 LAB — BASIC METABOLIC PANEL
Anion gap: 9 (ref 5–15)
BUN: 36 mg/dL — ABNORMAL HIGH (ref 6–20)
CO2: 25 mmol/L (ref 22–32)
Calcium: 8.9 mg/dL (ref 8.9–10.3)
Chloride: 100 mmol/L (ref 98–111)
Creatinine, Ser: 1.44 mg/dL — ABNORMAL HIGH (ref 0.61–1.24)
GFR, Estimated: >60 mL/min (ref >60)
Glucose, Bld: 124 mg/dL — ABNORMAL HIGH (ref 70–99)
Potassium: 4 mmol/L (ref 3.5–5.1)
Sodium: 134 mmol/L — ABNORMAL LOW (ref 135–145)

## 2021-04-13 LAB — CBC
HCT: 25.4 % — ABNORMAL LOW (ref 39.0–52.0)
Hemoglobin: 7.9 g/dL — ABNORMAL LOW (ref 13.0–17.0)
MCH: 29.8 pg (ref 26.0–34.0)
MCHC: 31.1 g/dL (ref 30.0–36.0)
MCV: 95.8 fL (ref 80.0–100.0)
Platelets: 191 10*3/uL (ref 150–400)
RBC: 2.65 MIL/uL — ABNORMAL LOW (ref 4.22–5.81)
RDW: 21.1 % — ABNORMAL HIGH (ref 11.5–15.5)
WBC: 14.7 10*3/uL — ABNORMAL HIGH (ref 4.0–10.5)
nRBC: 0 % (ref 0.0–0.2)

## 2021-04-13 LAB — GLUCOSE, CAPILLARY
Glucose-Capillary: 116 mg/dL — ABNORMAL HIGH (ref 70–99)
Glucose-Capillary: 143 mg/dL — ABNORMAL HIGH (ref 70–99)
Glucose-Capillary: 150 mg/dL — ABNORMAL HIGH (ref 70–99)
Glucose-Capillary: 109 mg/dL — ABNORMAL HIGH (ref 70–99)

## 2021-04-13 LAB — URINALYSIS, ROUTINE W REFLEX MICROSCOPIC
Bilirubin Urine: NEGATIVE
Glucose, UA: NEGATIVE mg/dL
Hgb urine dipstick: NEGATIVE
Ketones, ur: NEGATIVE mg/dL
Leukocytes,Ua: NEGATIVE
Nitrite: NEGATIVE
Protein, ur: NEGATIVE mg/dL
Specific Gravity, Urine: 1.009 (ref 1.005–1.030)
pH: 6 (ref 5.0–8.0)

## 2021-04-13 LAB — MAGNESIUM: Magnesium: 1.7 mg/dL (ref 1.7–2.4)

## 2021-04-13 IMAGING — CR DG CHEST 2V
2 series · 3 of 3 positions shown · non-contrast
Comparison: [DATE].

CLINICAL DATA: Leukocytosis.

EXAM:
CHEST - 2 VIEW

[Series 2: chest lat · 0.14mm/px · 2 of 2 slices shown]
[im 1/2]
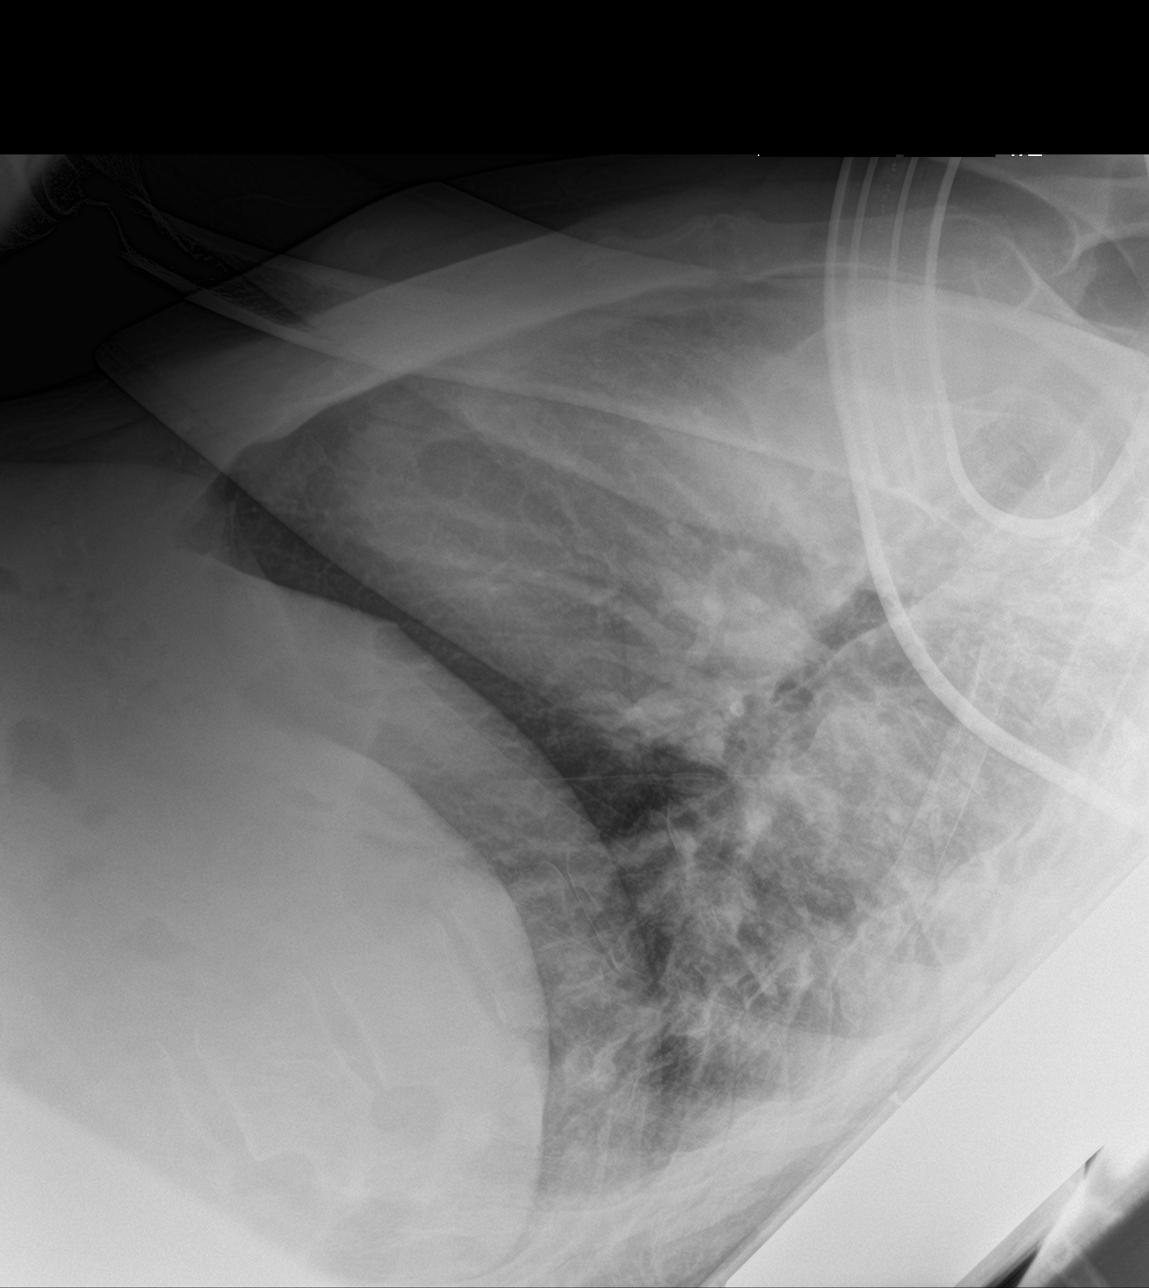
[im 2/2]
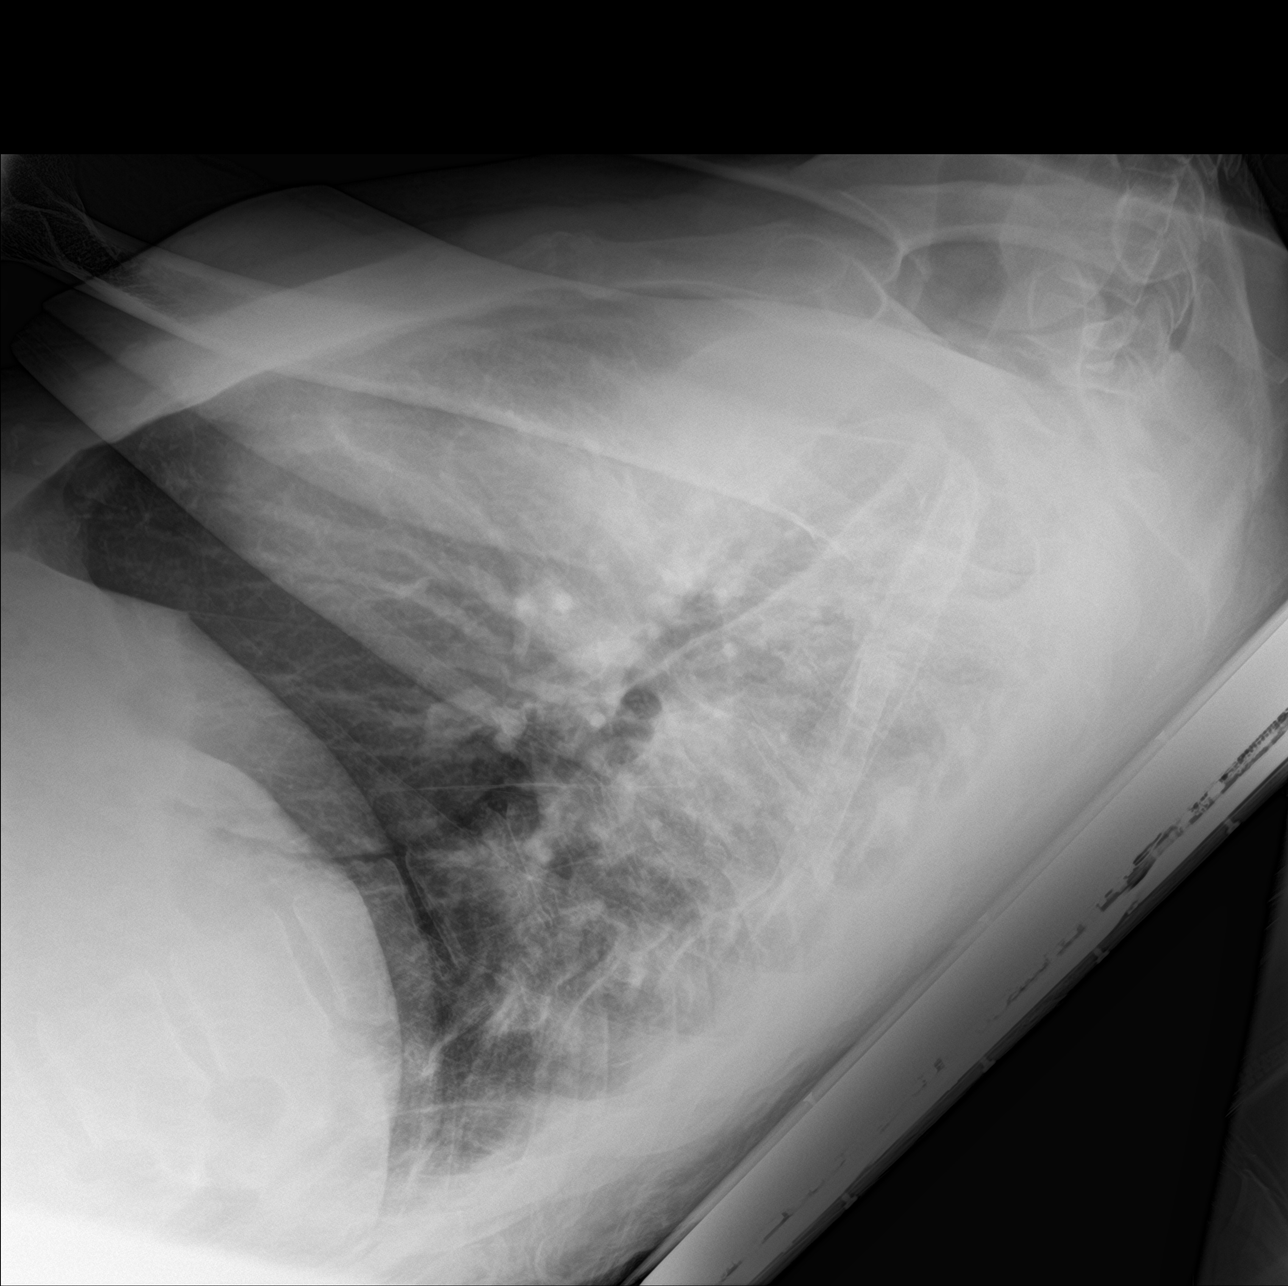

[chest ap]
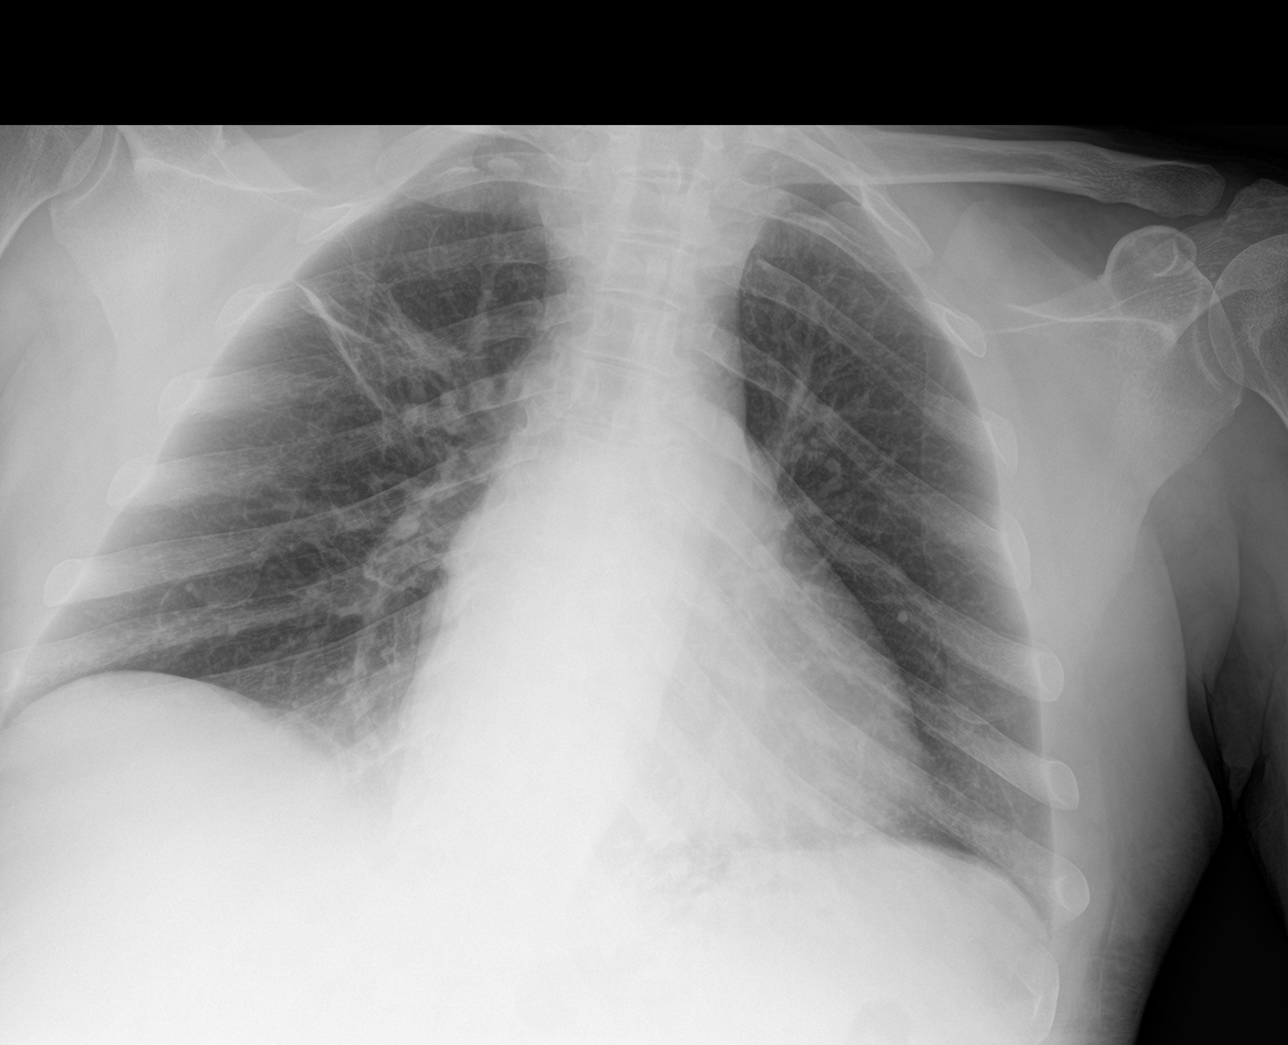

[3 of 3 positions shown; findings below may reference images not displayed]

FINDINGS: Interval extubation. Removal of the previously seen enteric tube and
right IJ sheath. Overall improved lung aeration with persistent but
improved streaky opacities in the right suprahilar and left basilar
lung. No visible pneumothorax or pleural effusion. Similar mild
enlargement the cardiac silhouette.
IMPRESSION: 1. Improved lung aeration with persistent but improved streaky
opacities in the right suprahilar and left basilar lung.
2. Interval extubation with removal of enteric tube and right IJ
sheath.

## 2021-04-13 MED ORDER — SODIUM CHLORIDE 0.9 % IV SOLN
250.0000 mg | Freq: Every day | INTRAVENOUS | Status: AC
  Administered 2021-04-13 – 2021-04-14 (×2): 250 mg via INTRAVENOUS
  Filled 2021-04-13 (×2): qty 20

## 2021-04-13 MED ORDER — COLCHICINE 0.6 MG PO TABS
0.6000 mg | ORAL_TABLET | Freq: Two times a day (BID) | ORAL | Status: AC
  Administered 2021-04-13 (×2): 0.6 mg via ORAL
  Filled 2021-04-13 (×2): qty 1

## 2021-04-13 MED ORDER — FUROSEMIDE 10 MG/ML IJ SOLN
80.0000 mg | Freq: Once | INTRAMUSCULAR | Status: AC
  Administered 2021-04-14: 80 mg via INTRAVENOUS
  Filled 2021-04-13: qty 8

## 2021-04-13 NOTE — Progress Notes (Signed)
Nutrition Follow-up  DOCUMENTATION CODES:   Not applicable  INTERVENTION:  Continue 30 ml ProSource Plus po TID, each supplement provides 100 kcal and 15 grams of protein.     Continue Magic cup TID with meals, each supplement provides 290 kcal and 9 grams of protein.   Continue MVI with minerals daily.  Continue nourishment snacks. RD ordered.   NUTRITION DIAGNOSIS:   Increased nutrient needs related to chronic illness (HIV) as evidenced by estimated needs; ongoing  GOAL:   Patient will meet greater than or equal to 90% of their needs; met  MONITOR:   PO intake, Supplement acceptance, Labs, Weight trends, Skin, I & O's  REASON FOR ASSESSMENT:   Malnutrition Screening Tool    ASSESSMENT:   45 year-old male with medical history of recently diagnosed HIV/AIDS and non-compliant with HAART, DM, asthma, HLD, HTN, and gout who presents with ICU myopathy.  Meal completion has been 100%. Pt currently has Prosource plus and nourishment snacks ordered and has been consuming them. RD to continue with current orders to aid in caloric and protein needs. Plans for another lasix dose tomorrow morning.   Labs and medications reviewed.   Diet Order:   Diet Order             Diet Carb Modified Fluid consistency: Thin; Room service appropriate? Yes  Diet effective now                   EDUCATION NEEDS:   Education needs have been addressed  Skin:  Skin Assessment: Reviewed RN Assessment Skin Integrity Issues:: Stage II, Other (Comment) Stage II: L & R buttocks, sacrum Other: MASD coccyx; MARSI L face from ETT holder  Last BM:  8/8  Height:   Ht Readings from Last 1 Encounters:  03/30/21 5' 10"  (1.778 m)    Weight:   Wt Readings from Last 1 Encounters:  03/30/21 84.1 kg   BMI:  Body mass index is 26.6 kg/m.  Estimated Nutritional Needs:   Kcal:  2400-2600  Protein:  130-145 grams  Fluid:  >2.4 L  Corrin Parker, MS, RD, LDN RD pager number/after  hours weekend pager number on Amion.

## 2021-04-13 NOTE — Progress Notes (Signed)
PROGRESS NOTE   Subjective/Complaints:  Pt reports more back pain and also still having pain in feet- that's a little better than yesterday- wants his oxycodone increased- he's taking 10 mg oxy and hadn't planned to send pt home with more than 7 days of pain meds- will wait. Not sure has PCP? But will have ot see ID at least after d/c.   Due to increased WBC/leukocytosis- will check U/A and Cx as well as CXR- (-) so far.  No CVA tenderness.   Swelling in LE's better. Not resolved.    ROS:   Pt denies SOB, abd pain, CP, N/V/C/D, and vision changes    Objective:   DG Chest 2 View  Result Date: 04/13/2021 CLINICAL DATA:  Leukocytosis. EXAM: CHEST - 2 VIEW COMPARISON:  03/21/2021. FINDINGS: Interval extubation. Removal of the previously seen enteric tube and right IJ sheath. Overall improved lung aeration with persistent but improved streaky opacities in the right suprahilar and left basilar lung. No visible pneumothorax or pleural effusion. Similar mild enlargement the cardiac silhouette. IMPRESSION: 1. Improved lung aeration with persistent but improved streaky opacities in the right suprahilar and left basilar lung. 2. Interval extubation with removal of enteric tube and right IJ sheath. Electronically Signed   By: Margaretha Sheffield MD   On: 04/13/2021 10:08   Recent Labs    04/13/21 0502  WBC 14.7*  HGB 7.9*  HCT 25.4*  PLT 191     Recent Labs    04/12/21 0644 04/13/21 0502  NA 137 134*  K 3.8 4.0  CL 104 100  CO2 24 25  GLUCOSE 137* 124*  BUN 33* 36*  CREATININE 1.38* 1.44*  CALCIUM 8.6* 8.9    Intake/Output Summary (Last 24 hours) at 04/13/2021 1945 Last data filed at 04/13/2021 1756 Gross per 24 hour  Intake 1070.63 ml  Output 2000 ml  Net -929.37 ml         Physical Exam: Vital Signs Blood pressure 133/90, pulse (!) 103, temperature 98.8 F (37.1 C), temperature source Oral, resp. rate 18, height 5'  10" (1.778 m), weight 84.1 kg, SpO2 100 %.    General: awake, alert, appropriate,irritable; NAD HENT: conjugate gaze; oropharynx moist CV: regular rate; no JVD Pulmonary: CTA B/L; no W/R/R- good air movement- sounds good GI: soft, NT, ND, (+)BS Psychiatric: appropriate but irritable- asking when goes home;  Neurological: alert  Ext: no clubbing, cyanosis, 1-2+ pedal edema Psych: pleasant and cooperative  Musculoskeletal: TTP over midline low back- not c/w CVA tenderness    Comments: Bilateral feet  with 2+ edema left>right. Ues- deltoids and biceps 4-/5, Triceps 4-/5, WE/grip and finger abd 4+/5 all B/L LE's - HF 2+/5, KE 3-/5, PF 3-/5, DF 2/5 (Pain?) and PF 2/5 (pain?)- all B/L L shoulder- TTP on top/AC joint and RTC/posterior Skin:    General: Skin is warm and dry.    Comments: multiple scars on leg  LE's 3-4 LE edema to mid/upper calves. B/L  Neurological:    Mental Status: He is alert and oriented to person, place, and time.    Comments: Appears decreased sensation to light touch in LE's from knees down, but pt wasn't clear  -  same as what was on admission- decreased to LT in LE's worse on LLE.    Assessment/Plan: 1. Functional deficits which require 3+ hours per day of interdisciplinary therapy in a comprehensive inpatient rehab setting. Physiatrist is providing close team supervision and 24 hour management of active medical problems listed below. Physiatrist and rehab team continue to assess barriers to discharge/monitor patient progress toward functional and medical goals  Care Tool:  Bathing    Body parts bathed by patient: Right arm, Left arm, Chest, Abdomen, Right upper leg, Left upper leg, Face, Front perineal area, Right lower leg, Left lower leg, Buttocks   Body parts bathed by helper: Buttocks, Right lower leg, Left lower leg     Bathing assist Assist Level: Set up assist (per most recent staff documentation)     Upper Body Dressing/Undressing Upper body  dressing   What is the patient wearing?: Pull over shirt    Upper body assist Assist Level: Independent with assistive device (per most recent staff documentation)    Lower Body Dressing/Undressing Lower body dressing      What is the patient wearing?: Underwear/pull up, Pants     Lower body assist Assist for lower body dressing: Independent with assitive device (per most recent staff documentation)     Toileting Toileting    Toileting assist Assist for toileting: Independent with assistive device     Transfers Chair/bed transfer  Transfers assist     Chair/bed transfer assist level: Independent with assistive device Chair/bed transfer assistive device: Museum/gallery exhibitions officer assist      Assist level: Supervision/Verbal cueing Assistive device: Walker-rolling Max distance: 300 ft   Walk 10 feet activity   Assist     Assist level: Independent with assistive device Assistive device: Walker-rolling   Walk 50 feet activity   Assist Walk 50 feet with 2 turns activity did not occur: Safety/medical concerns  Assist level: Independent with assistive device Assistive device: Walker-rolling    Walk 150 feet activity   Assist Walk 150 feet activity did not occur: Safety/medical concerns  Assist level: Supervision/Verbal cueing Assistive device: Walker-rolling    Walk 10 feet on uneven surface  activity   Assist Walk 10 feet on uneven surfaces activity did not occur: Safety/medical concerns   Assist level: Supervision/Verbal cueing Assistive device: Aeronautical engineer Will patient use wheelchair at discharge?: Yes Type of Wheelchair: Manual Wheelchair activity did not occur: Refused (Pt stated he would not leave his room)  Wheelchair assist level: Independent Max wheelchair distance: 500 ft    Wheelchair 50 feet with 2 turns activity    Assist    Wheelchair 50 feet with 2 turns activity  did not occur: Refused   Assist Level: Independent   Wheelchair 150 feet activity     Assist  Wheelchair 150 feet activity did not occur: Refused   Assist Level: Independent   Blood pressure 133/90, pulse (!) 103, temperature 98.8 F (37.1 C), temperature source Oral, resp. rate 18, height '5\' 10"'$  (1.778 m), weight 84.1 kg, SpO2 100 %.  Medical Problem List and Plan: 1.  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 Maybe earlier- supervision to mod I- likely needs Mod I  -Continue  CIR therapies including PT, OT, and SLP  8/9- according to therapy, has been discharged from PT and OT- was mod I- will hopefully be ready for d/c in next 1-2 days-  2.  Impaired mobility, ambulating >200 feet: d/c Lovenox 3. Left shoulder/back/foot pain/Pain/diabetic peripheral neuropathy: continue Oxycodone prn. Increase steroids for gout flare. Will do 60 mg daily x 2 days, then taper. Pt rates pain as "11/10". Needs to bring in shoes so able to walk better; If steroids PO doesn't work, might need L shoulder injection. Pt hates needles.   7/27- pt reports tylenol helpful- con't regimen  8/1- added Lidoderm patches for Shoulder and back pain. 8pm to 8am  8/6 increase gabapentin to '300mg'$  TID  8/9- pt insistent "oxy doesn't work- but already taking 10 mg q4 hours around the clock- will not increase.  4. Mood: LCSW to follow for evaluation and support.              -antipsychotic agents: N/A 5. Neuropsych: This patient is capable of making decisions on his own behalf. 6. Skin/Wound Care: Routine pressure relief measures.  7. Fluids/Electrolytes/Nutrition: Strict I/O. Check CMET in am 8. PJP PNA:  Duration of meds discussed with pharmacy who relayed that patient has completed 21+ days of steroids and primaquine. --Clindamycin thorough 07/29 to complete 21 days. 9. Gout  flare: Was on colchicine 0.6 mg daily PTA. --prednisone taper almost completed 8/9- will give colchicine dose x2 again to see if will help feet pain.  10. T2DM: Hgb A1c- 5.4. Was on glucotrol/metformin bid PTA.             --continue Lantus while on steroids with SSI for elevated BS.  7/27- BG's 94-159- good considering steroids- con't to monitor  8/6- BG's controlled, in spite of steroids- con't regimen 11. Anxiety d/o: Was on Klonopin TID PTA. --bid effective at this time. 12. HTN: Monitor BP tid--continue hydralazine and Norvasc.  7/27- BP slightly elevated- will monitor for trend.   8/6- BP under fair/borderline control 13. HIV: On Epivir and Tivicay 14. Anemia: H/H steadily improving. Recheck H/H in am.  7/27- Hb 8.9- still above a few days ago- con't to monitor  8/1- Hb back down to 7.9- has gone up and donw 1 unit- if still down next week, might need more intervention? 15. Hypomagnesemia: Was supplemented yesterday. --Recheck in am.  7/27- wasn't done/ordered- so will check in AM- last Mg 1.3- will give Magnesium based on labs in AM.   7/28- will add MgOx 400 mg BID and recheck qMonday.   8/1- pt refuses more labs except for 1x/week so cannot do IV_ will increase Mg, in spite of fact Mg still 1.3- like last week. This is a CHRONIC low Mg level, FYI.   8/8- Mg 1.1- has to go home on PO Mg. Will see if can convince him to get IV Mg.  16. Intermittent Leucocytosis: Monitor for fevers and other signs of infection.   7/27- WBC 10.3- will monitor  8/1- WBC 11.0- but no signs/Sx's of infection- afebrile  8/9- WBC 14.7- U/A and CXR (-)- will recheck in AM- no fever (Tm 99.3- per staff, covered in blankets). Will monitor and recheck labs 17. Dysphagia- improved- now just passed to regular diet with thin liquids as of today. 18. Urinary retention- has failed at least 1 voiding trial on 7/20- on Flomax 0.4 mg nightly- will make sure has been on 7 days before trying another voiding  trial.  7/29- Flomax dropped off  when got here, so don't remove foley until at least 8/1- since restarted 7/28   voiding   8/8- is voiding- foley out.  19. Bowel incontinence- will see if can help pt become continent with improved function.   7/27- nursing got up to [facility] last night.  20. Chipped nails- will see if can help?  7/28- says it's better after Olin Hauser helping him.  21. GERD  87/28- will restart Protonix- also has tums prn. 22. Pressure injury to buttocks: offload q38mnutes 23. Bilateral lower extremity swelling: ice, elevated, compression garments 24. Shoulder pain: continue lidocaine patch and voltaren gel.  25. Nearsighted- schedule outpatient ophthalmology eval- should be checked for diabetic retinopathy 26. Diabetic peripheral neuropathy: will benefit from outpatient Qutenza. Ordered diabetic shoes  -gabapentin increased as above  8/9- likely cause of some of pain- doesn't respond to oxy 27. Lower extremity edema:resumed '10mg'$  daily Lasix.  28. AKI:   8/8- Cr 1.38- back and forth- from 1.07 to 1.40- could be Septra??? But will let him f/u with ID/or get PCP.   8/9- a lot of LE edema- s/p Lasix 80 mg IV again- Renal following- appreciate their input. BMP in AM      LOS: 14 days A FACE TO FACE EVALUATION WAS PERFORMED  Dagny Fiorentino 04/13/2021, 7:45 PM

## 2021-04-13 NOTE — Progress Notes (Signed)
Nephrology Progress Note:   Subjective: nephrology reconsulted on 8/8. He had 1.8 liters UOP over 8/8 as well as 5 unmeasured urine voids.  Had 1.3 liters UOP thus far today as well.  got lasix 80 mg IV yesterday afternoon and this AM.  He feels like legs are less tight and painful.  Legs Still feel heavy.  States his legs were wrapped  Review of systems:    Denies shortness of breath  Decreased mobility Denies n/v Denies cp  States he's urinating quite a bit - multiple urinals filled   Objective Vital signs in last 24 hours: Vitals:   04/12/21 2159 04/13/21 0008 04/13/21 0403 04/13/21 0700  BP: 130/86 133/80 130/81 137/90  Pulse: (!) 110 (!) 104 95 (!) 101  Resp: '16 16 17 18  '$ Temp: 97.9 F (36.6 C) 98.7 F (37.1 C) 97.9 F (36.6 C) 97.9 F (36.6 C)  TempSrc:    Oral  SpO2: 99% 98% 97% 100%  Weight:      Height:       Weight change:   Intake/Output Summary (Last 24 hours) at 04/13/2021 1257 Last data filed at 04/13/2021 1242 Gross per 24 hour  Intake 798.98 ml  Output 3100 ml  Net -2301.02 ml    Assessment/ Plan: Pt is a 45 y.o. yo male with HIV/AIDS, DM, baseline CKD who was admitted on 03/30/2021 with acute hypoxic resp failure with sepsis and AKI.  New diagnosis HIV and with PJP PNA during acute hospitalization now s/p course of abx for same   Assessment/Plan: 1. Hx AKI  AKI in the setting of hypoxic resp failure, ATN with septic shock and also urinary retention.  Off and on CRRT-  most recently stopped on 7/15 and temp HD catheter out.  Note 02/2020 Cr 2.80 and 05/2011 Cr 0.90 - only prior labs in [facility] system.  reported baseline crt in the high 2's however this was based on limited data and may have just been reflective of prior AKI episode.   - repeat lasix 80 mg IV in AM  - strict ins/outs ordered - BMP in AM  2. Chronic hypoxic resp failure s/p trach - now decannulated  3. HIV - regimen per primary team/ID  4. HTN - above goal and with overload; for diuresis as  above   5. Anemia normocytic - no acute need for PRBC's or ESA.  Iron deficiency. IV iron x 2 doses ordered  6. Impaired mobility - per CIR team  7.  Urinary retention-  continue flomax and note foley is removed  8. Hypomagnesemia  - repleted   Labs: Basic Metabolic Panel: Recent Labs  Lab 04/09/21 0740 04/12/21 0644 04/13/21 0502  NA 134* 137 134*  K 3.8 3.8 4.0  CL 100 104 100  CO2 '24 24 25  '$ GLUCOSE 118* 137* 124*  BUN 31* 33* 36*  CREATININE 1.17 1.38* 1.44*  CALCIUM 9.0 8.6* 8.9   Liver Function Tests: Recent Labs  Lab 04/12/21 0644  ALBUMIN 2.8*    No results for input(s): LIPASE, AMYLASE in the last 168 hours. No results for input(s): AMMONIA in the last 168 hours. CBC: Recent Labs  Lab 04/13/21 0502  WBC 14.7*  HGB 7.9*  HCT 25.4*  MCV 95.8  PLT 191    Cardiac Enzymes: No results for input(s): CKTOTAL, CKMB, CKMBINDEX, TROPONINI in the last 168 hours. CBG: Recent Labs  Lab 04/12/21 1127 04/12/21 1626 04/12/21 2117 04/13/21 0544 04/13/21 1122  GLUCAP 105* 158* 130* 116* 143*  Iron Studies:  Recent Labs    04/12/21 1528  IRON 18*  TIBC 270  FERRITIN 866*    Studies/Results: DG Chest 2 View  Result Date: 04/13/2021 CLINICAL DATA:  Leukocytosis. EXAM: CHEST - 2 VIEW COMPARISON:  03/21/2021. FINDINGS: Interval extubation. Removal of the previously seen enteric tube and right IJ sheath. Overall improved lung aeration with persistent but improved streaky opacities in the right suprahilar and left basilar lung. No visible pneumothorax or pleural effusion. Similar mild enlargement the cardiac silhouette. IMPRESSION: 1. Improved lung aeration with persistent but improved streaky opacities in the right suprahilar and left basilar lung. 2. Interval extubation with removal of enteric tube and right IJ sheath. Electronically Signed   By: Margaretha Sheffield MD   On: 04/13/2021 10:08   Medications: Infusions:    Scheduled Medications:  (feeding  supplement) PROSource Plus  30 mL Oral TID BM   amLODipine  10 mg Oral Daily   clonazePAM  0.5 mg Oral TID   colchicine  0.6 mg Oral BID   dapsone  100 mg Oral Daily   diclofenac Sodium  2 g Topical QID   dolutegravir  50 mg Oral Daily   furosemide  10 mg Oral Daily   gabapentin  300 mg Oral Q12H   hydrALAZINE  25 mg Oral Q8H   insulin aspart  0-15 Units Subcutaneous TID WC   insulin aspart  0-5 Units Subcutaneous QHS   insulin glargine-yfgn  5 Units Subcutaneous Daily   lamiVUDine  300 mg Oral Daily   lidocaine  1 patch Transdermal Q24H   lidocaine  2 patch Transdermal Q24H   magnesium oxide  400 mg Oral BID   multivitamin with minerals  1 tablet Oral Daily   pantoprazole  40 mg Oral Daily   [START ON 04/14/2021] predniSONE  5 mg Oral Q breakfast   tamsulosin  0.4 mg Oral QPC supper    have reviewed scheduled and prn medications.  Physical Exam:  General adult male in bed in no acute distress  HEENT normocephalic atraumatic extraocular movements intact sclera anicteric Neck supple trachea midline; bandage over old trach site Lungs clear to auscultation bilaterally normal work of breathing at rest on room air Heart S1S2 no rub Abdomen soft nontender nondistended Extremities 2+ edema; legs are wrapped Psych normal mood and affect Neuro - awake and conversant; follows commands and provides hx   Claudia Desanctis, MD 04/13/2021,1:09 PM

## 2021-04-14 ENCOUNTER — Inpatient Hospital Stay (HOSPITAL_COMMUNITY): Payer: Medicaid Other

## 2021-04-14 ENCOUNTER — Other Ambulatory Visit (HOSPITAL_COMMUNITY): Payer: Self-pay

## 2021-04-14 LAB — BASIC METABOLIC PANEL
Anion gap: 11 (ref 5–15)
BUN: 34 mg/dL — ABNORMAL HIGH (ref 6–20)
CO2: 24 mmol/L (ref 22–32)
Calcium: 9.1 mg/dL (ref 8.9–10.3)
Chloride: 100 mmol/L (ref 98–111)
Creatinine, Ser: 1.35 mg/dL — ABNORMAL HIGH (ref 0.61–1.24)
GFR, Estimated: >60 mL/min (ref >60)
Glucose, Bld: 132 mg/dL — ABNORMAL HIGH (ref 70–99)
Potassium: 4.2 mmol/L (ref 3.5–5.1)
Sodium: 135 mmol/L (ref 135–145)

## 2021-04-14 LAB — CBC WITH DIFFERENTIAL/PLATELET
Abs Immature Granulocytes: 0.18 10*3/uL — ABNORMAL HIGH (ref 0.00–0.07)
Basophils Absolute: 0 10*3/uL (ref 0.0–0.1)
Basophils Relative: 0 %
Eosinophils Absolute: 0.3 10*3/uL (ref 0.0–0.5)
Eosinophils Relative: 2 %
HCT: 26.9 % — ABNORMAL LOW (ref 39.0–52.0)
Hemoglobin: 8.5 g/dL — ABNORMAL LOW (ref 13.0–17.0)
Immature Granulocytes: 1 %
Lymphocytes Relative: 8 %
Lymphs Abs: 1.3 10*3/uL (ref 0.7–4.0)
MCH: 30.2 pg (ref 26.0–34.0)
MCHC: 31.6 g/dL (ref 30.0–36.0)
MCV: 95.7 fL (ref 80.0–100.0)
Monocytes Absolute: 1.9 10*3/uL — ABNORMAL HIGH (ref 0.1–1.0)
Monocytes Relative: 12 %
Neutro Abs: 12.7 10*3/uL — ABNORMAL HIGH (ref 1.7–7.7)
Neutrophils Relative %: 77 %
Platelets: 194 10*3/uL (ref 150–400)
RBC: 2.81 MIL/uL — ABNORMAL LOW (ref 4.22–5.81)
RDW: 20.6 % — ABNORMAL HIGH (ref 11.5–15.5)
WBC: 16.4 10*3/uL — ABNORMAL HIGH (ref 4.0–10.5)
nRBC: 0 % (ref 0.0–0.2)

## 2021-04-14 LAB — URINE CULTURE: Culture: <10000 — AB

## 2021-04-14 LAB — GLUCOSE, CAPILLARY
Glucose-Capillary: 108 mg/dL — ABNORMAL HIGH (ref 70–99)
Glucose-Capillary: 135 mg/dL — ABNORMAL HIGH (ref 70–99)
Glucose-Capillary: 115 mg/dL — ABNORMAL HIGH (ref 70–99)
Glucose-Capillary: 116 mg/dL — ABNORMAL HIGH (ref 70–99)

## 2021-04-14 IMAGING — CR DG CHEST 2V
2 series · 2 of 2 positions shown · non-contrast
Comparison: [DATE]

CLINICAL DATA: Elevated white blood cell count

EXAM:
CHEST - 2 VIEW

[chest ap]
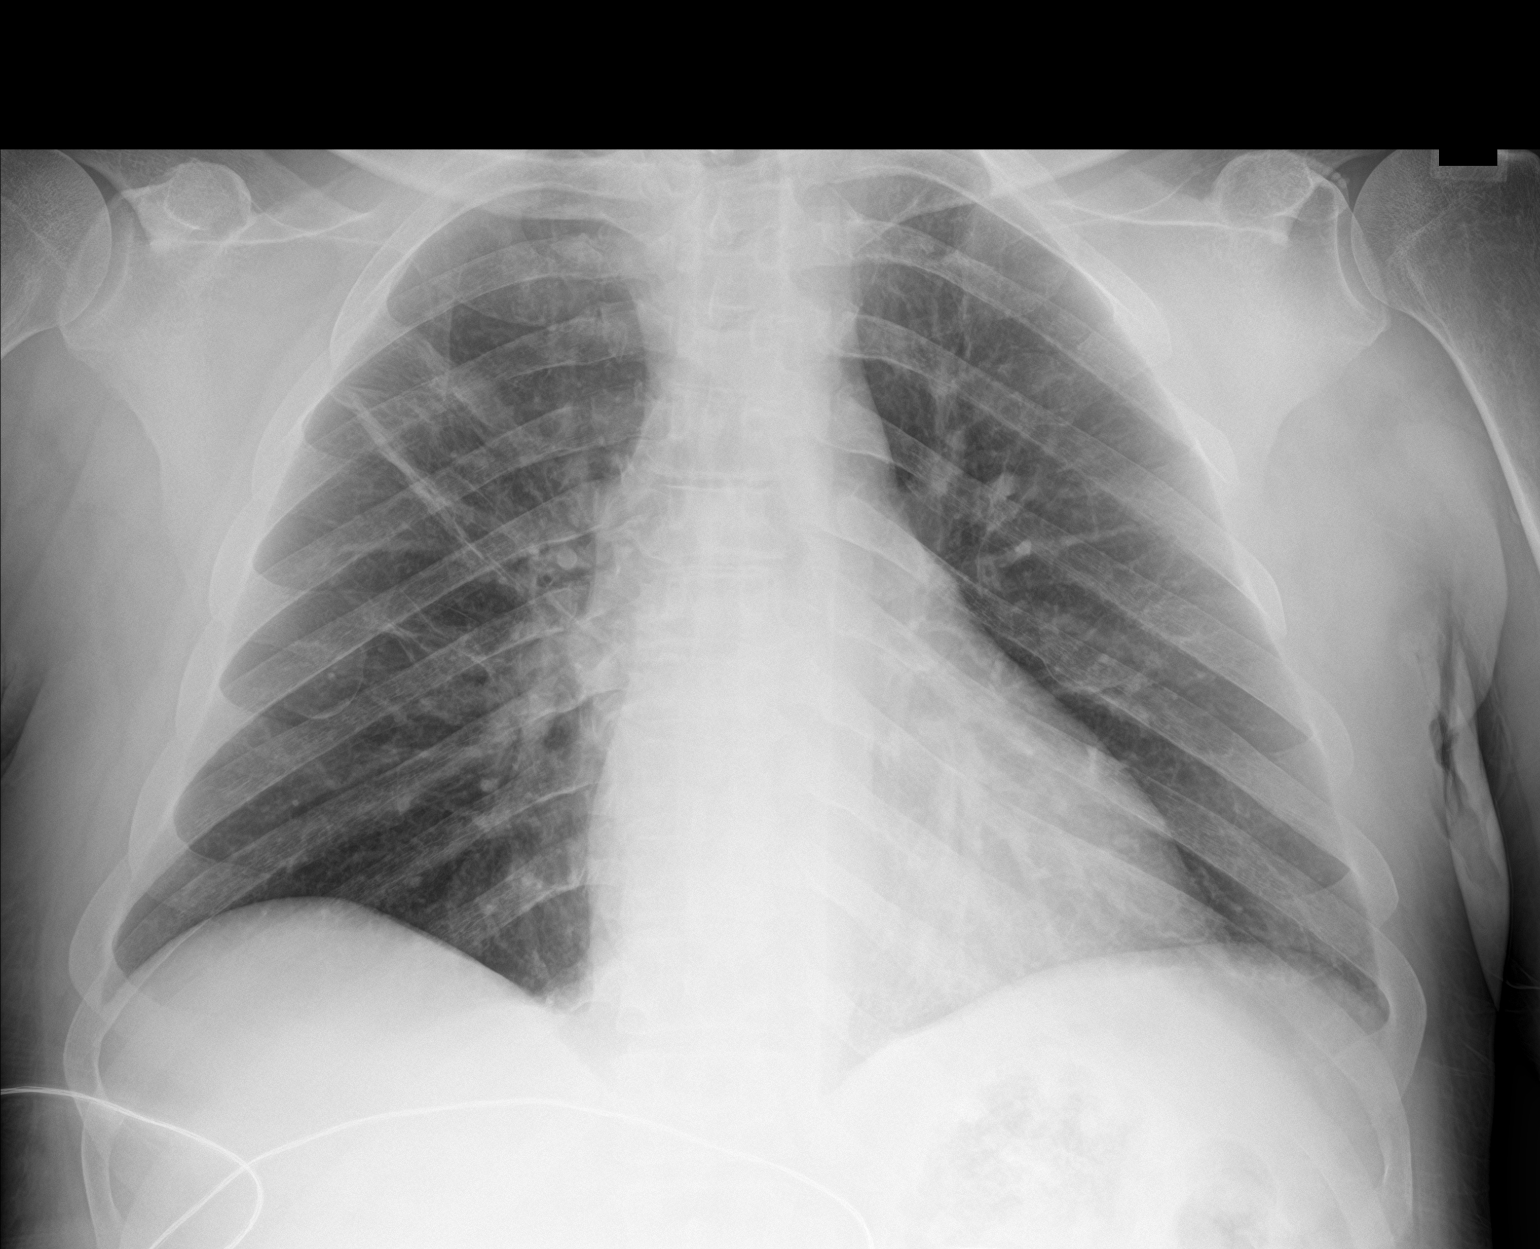

[chest lat]
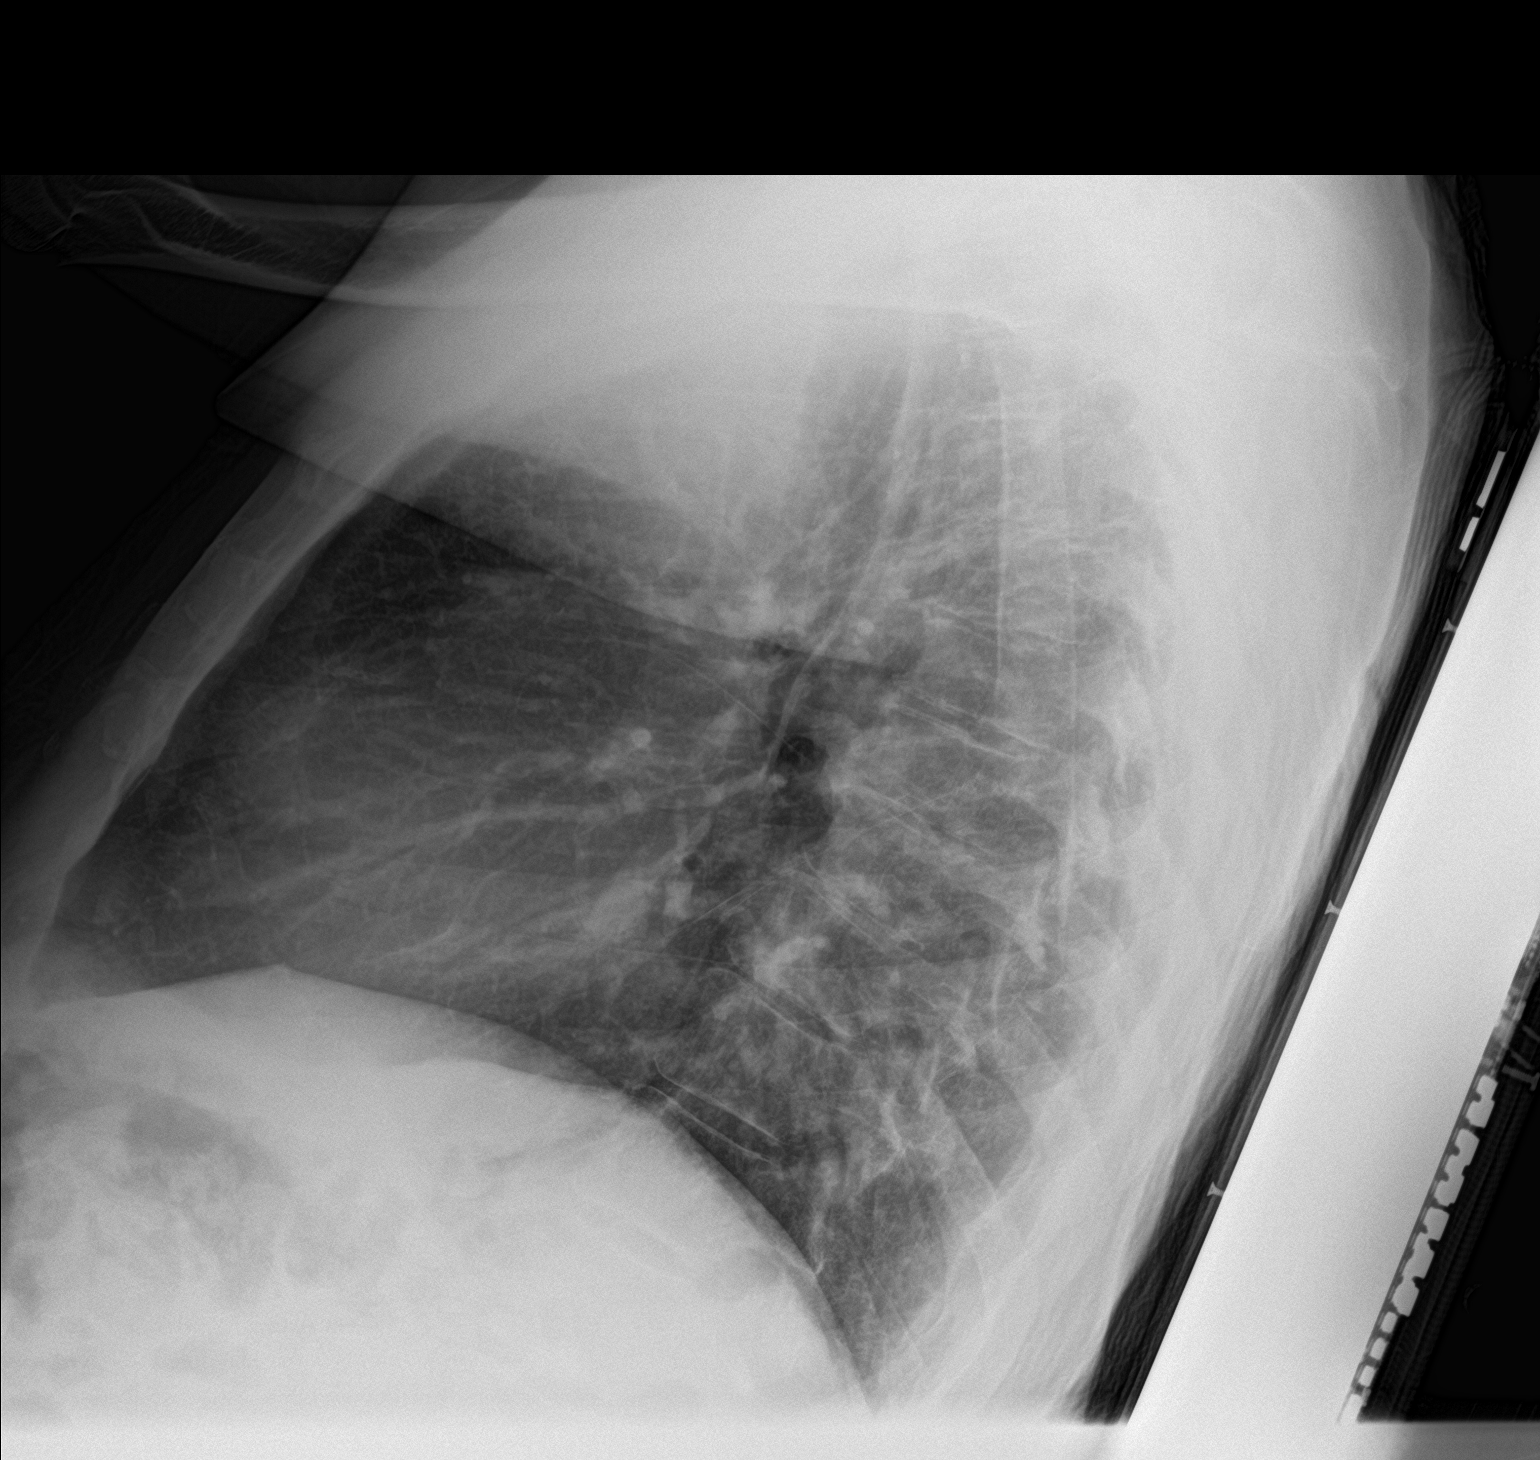

[2 of 2 positions shown; findings below may reference images not displayed]

FINDINGS: Scarring in the right upper lobe. Left lung clear. Heart is normal
size. No effusions or acute bony abnormality.
IMPRESSION: No active cardiopulmonary disease.

## 2021-04-14 IMAGING — CR DG FOOT COMPLETE 3+V*R*
3 series · 3 of 3 positions shown · non-contrast
Comparison: None.

CLINICAL DATA: Elevated white blood cell count

EXAM:
RIGHT FOOT COMPLETE - 3+ VIEW

[foot ap]
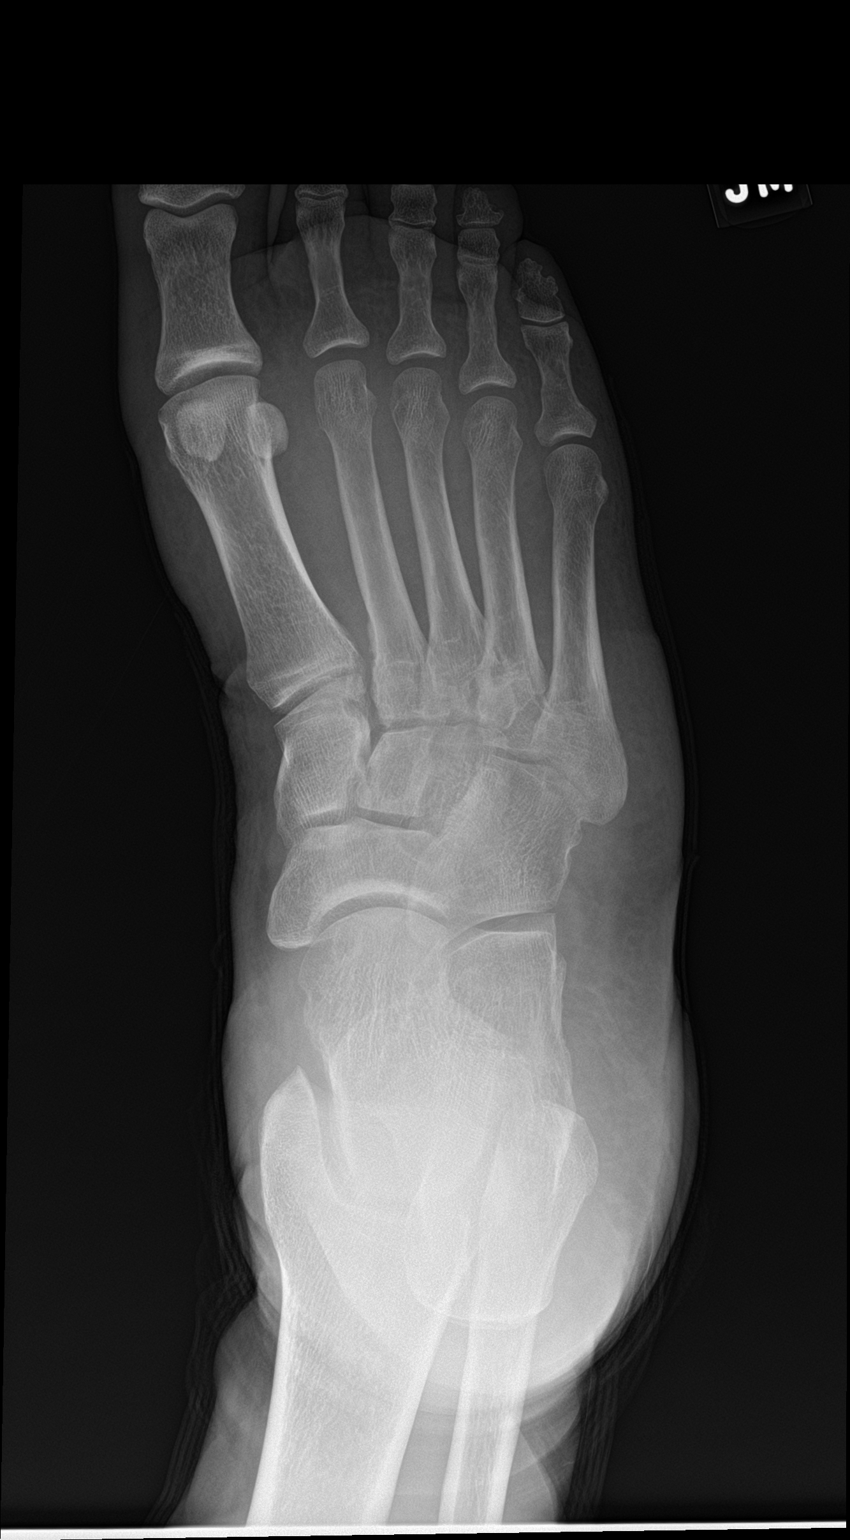

[foot obl]
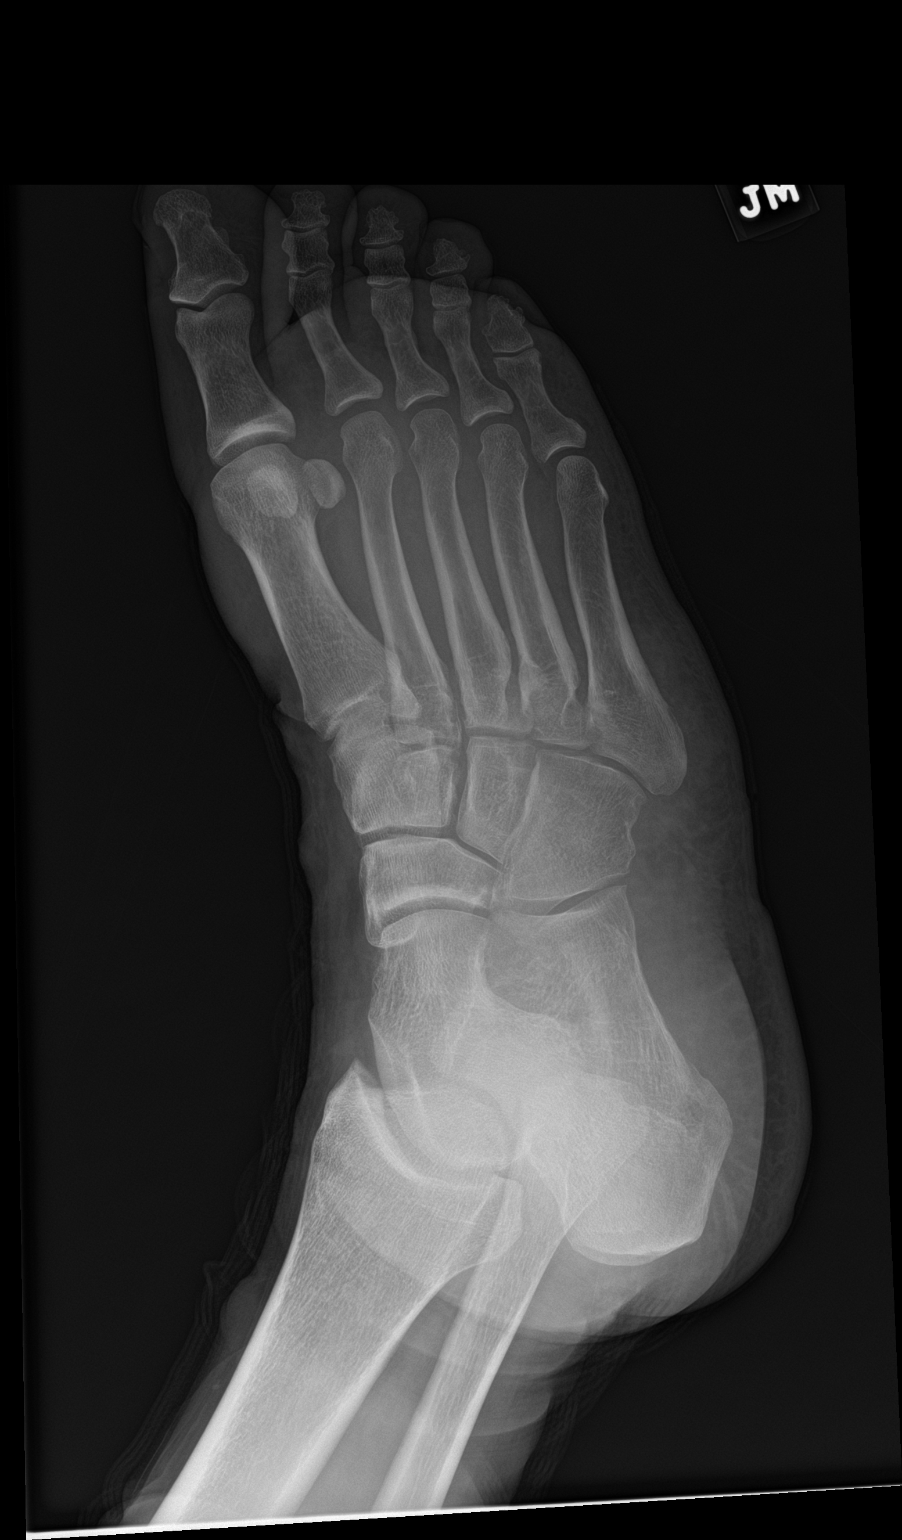

[foot lat]
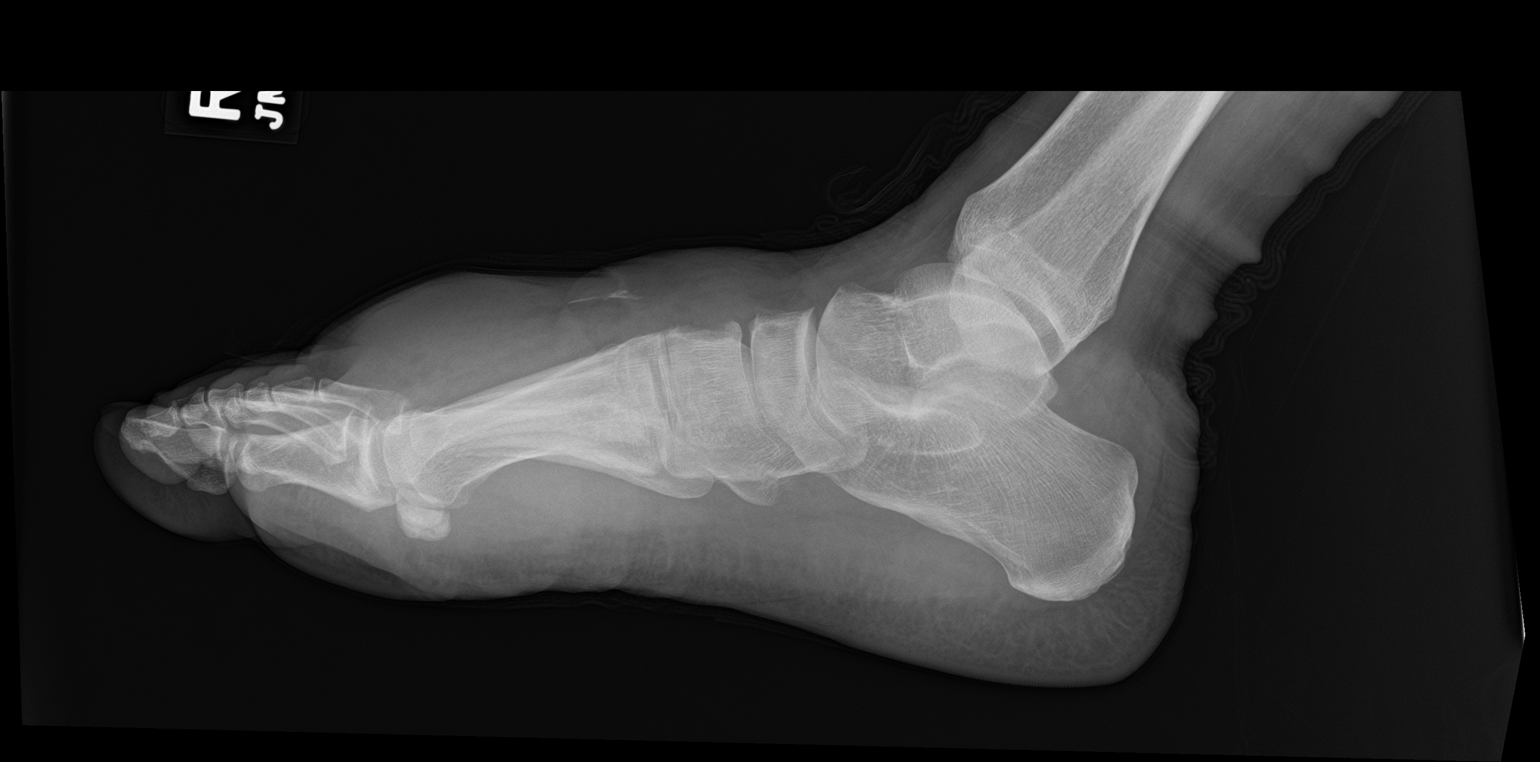

[3 of 3 positions shown; findings below may reference images not displayed]

FINDINGS: Soft tissue swelling along the dorsum of the foot. Radiopaque
densities are seen in the dorsal soft tissues of unknown etiology,
new since prior study. There is cortical lucency noted along the
dorsal surface of the medial kidney a form. Cannot exclude
osteomyelitis. No fracture, subluxation or dislocation.
IMPRESSION: Lucency of along the dorsal surface of the medial cuneiform on the
lateral view concerning for osteomyelitis. This could be further
evaluated with MRI.

Linear radiopaque densities within the dorsal soft tissues of
unknown etiology. This could reflect soft tissue calcifications.

Marked dorsal soft tissue swelling.

## 2021-04-14 IMAGING — CR DG FOOT COMPLETE 3+V*L*
2 series · 2 of 2 positions shown · non-contrast
Comparison: None.

CLINICAL DATA: Elevated white blood cell count.

EXAM:
LEFT FOOT - COMPLETE 3+ VIEW

[foot obl]
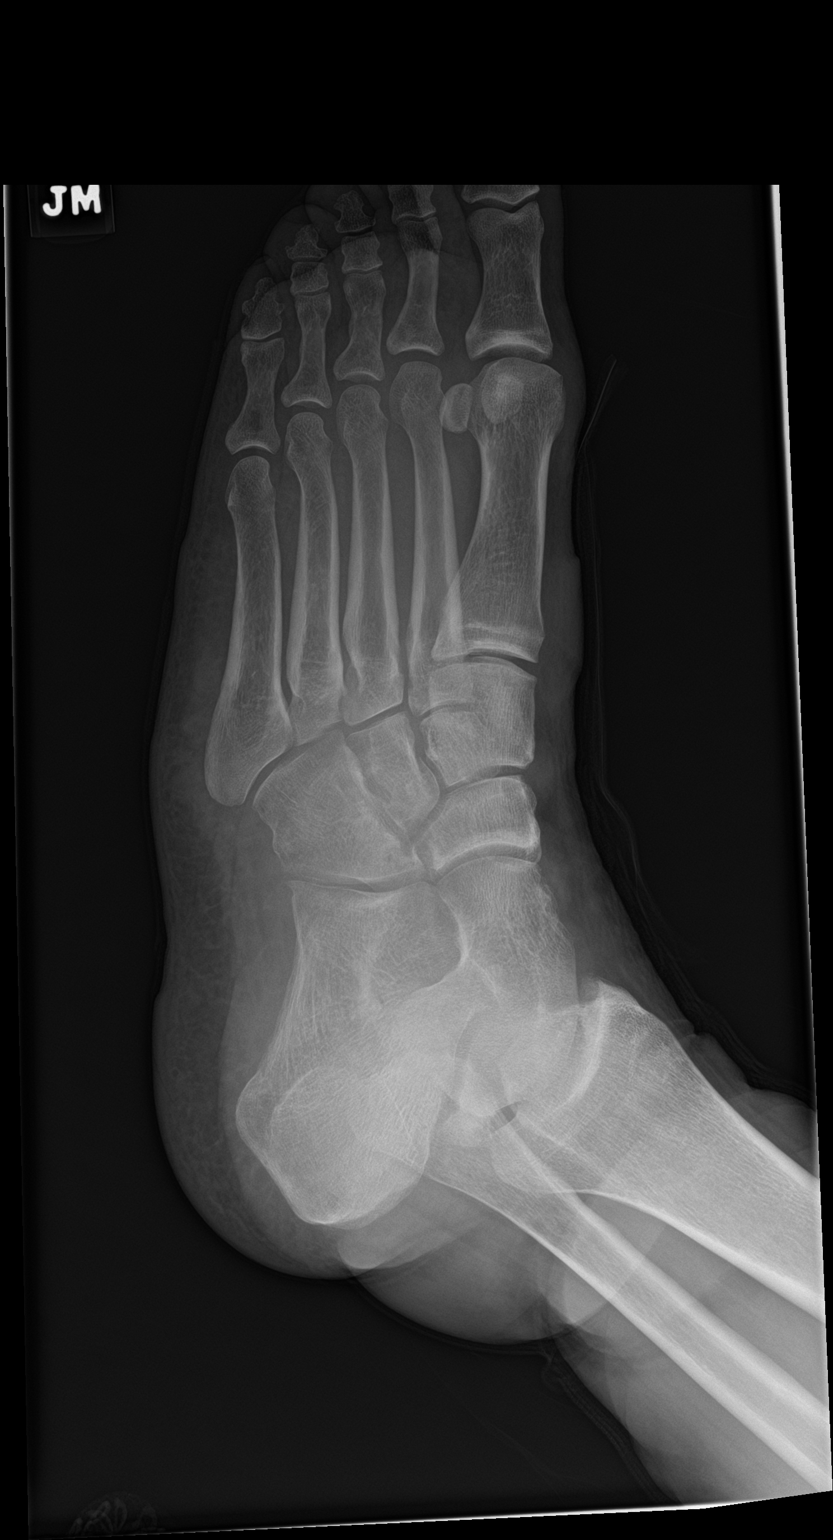

[foot lat]
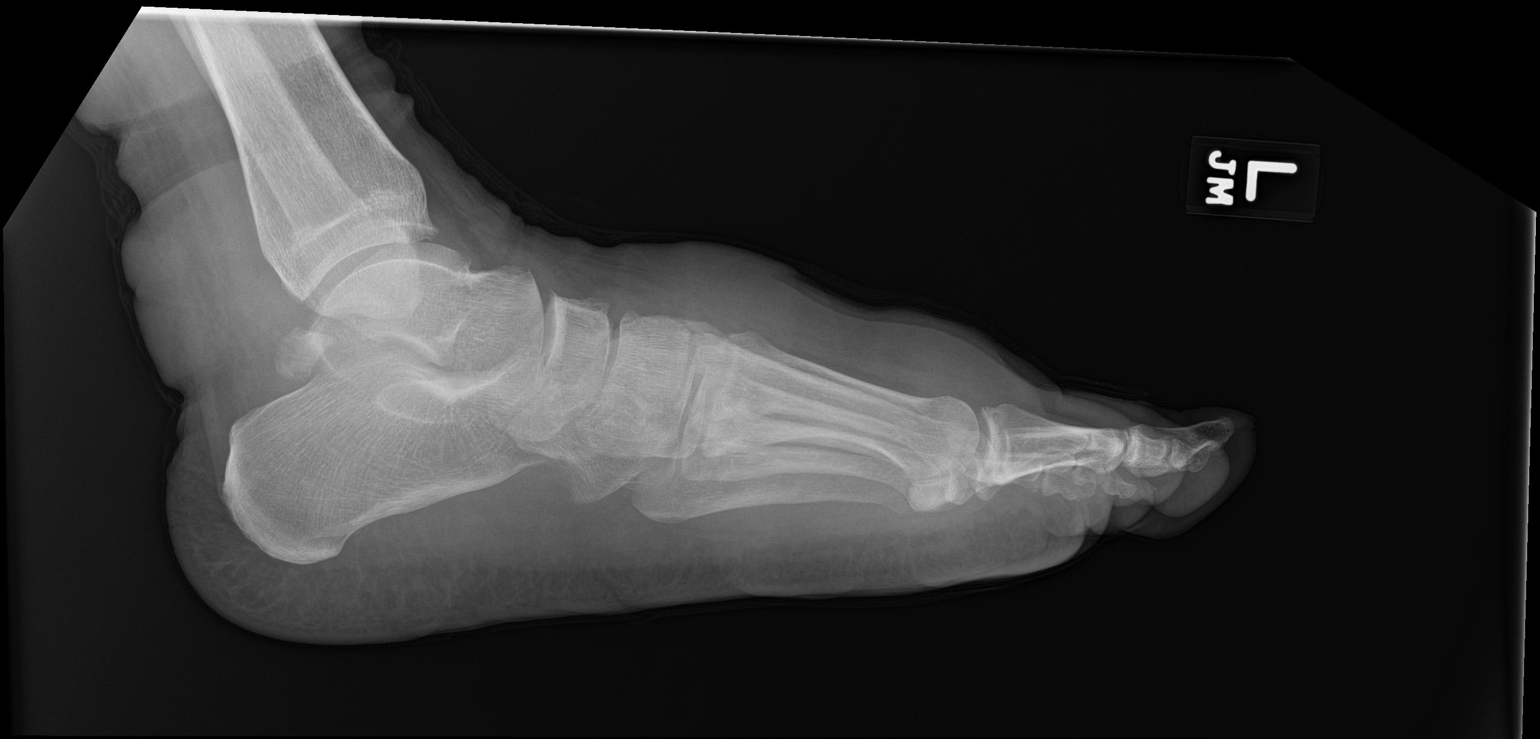

[2 of 2 positions shown; findings below may reference images not displayed]

FINDINGS: Soft tissue swelling along the dorsum of the foot. No acute bony
abnormality. Specifically, no fracture, subluxation, or dislocation.
No bone destruction to suggest osteomyelitis.
IMPRESSION: No acute bony abnormality.

## 2021-04-14 IMAGING — MR MR FOOT*R* W/O CM
5 series · 40 of 40 positions shown · non-contrast
Comparison: Radiographs [DATE]

CLINICAL DATA: Foot pain and swelling.  Diabetic.

EXAM:
MRI OF THE RIGHT FOREFOOT WITHOUT CONTRAST
TECHNIQUE: Multiplanar, multisequence MR imaging of the right foot was
performed. No intravenous contrast was administered.

[Series 4: T1 · coronal · right · 3.0mm · 0.47mm/px · 11 of 60 slices shown (1 of 2)]
[im 1/60]
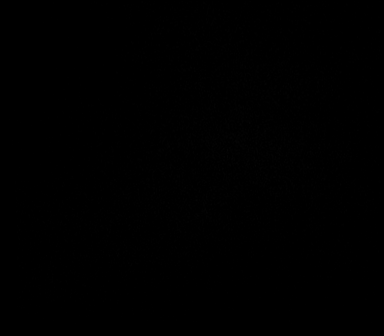
[im 6/60]
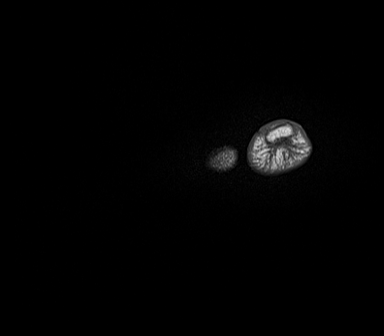
[im 12/60]
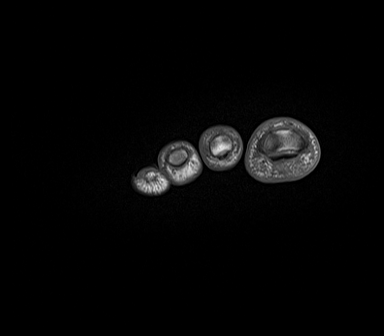
[im 18/60]
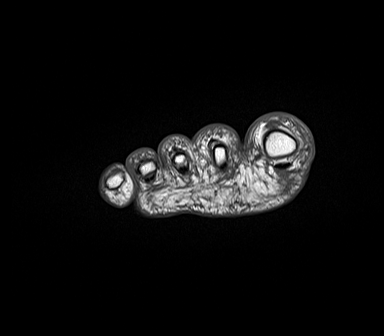
[im 24/60]
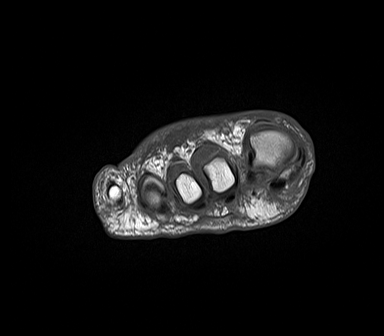
[im 30/60]
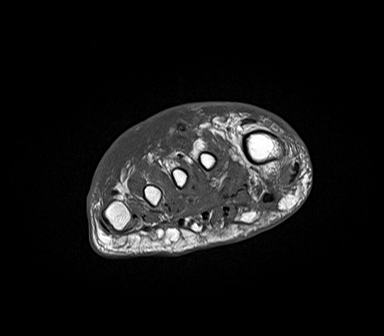
[im 36/60]
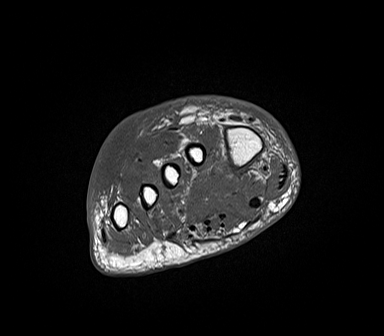
[im 42/60]
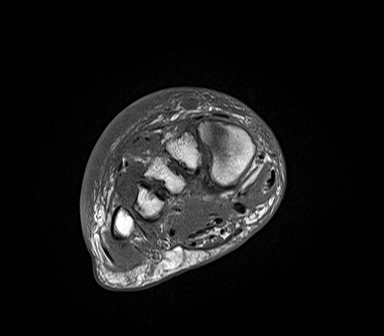
[im 48/60]
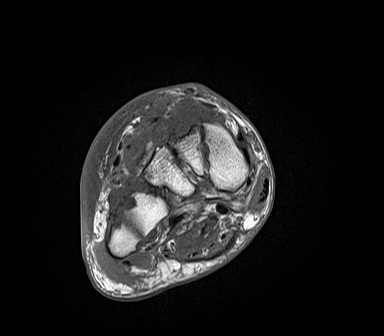
[im 54/60]
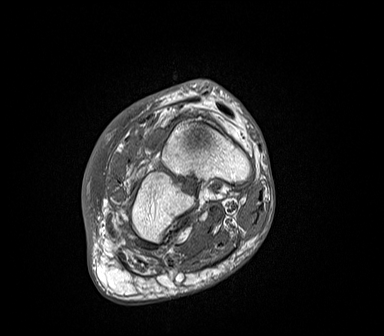
[im 60/60]
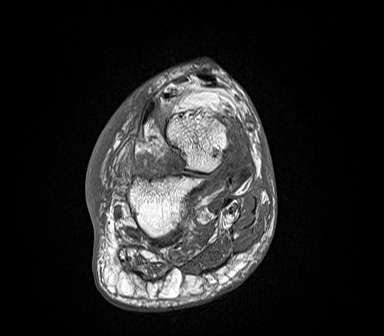

[Series 5: T2 fat-sat · coronal · right · 3.0mm · 0.49mm/px · 11 of 60 slices shown (1 of 2)]
[im 1/60]
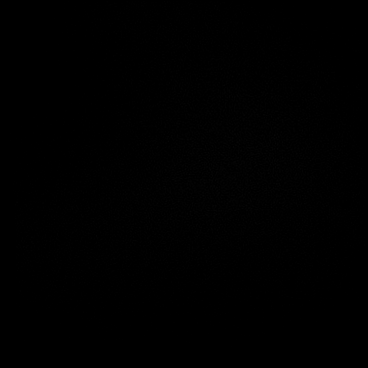
[im 6/60]
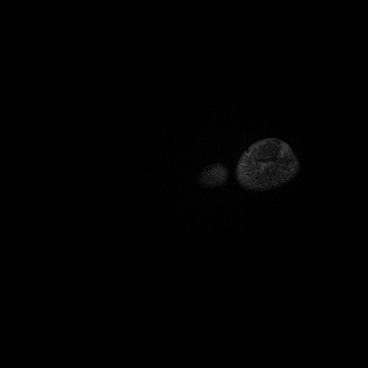
[im 12/60]
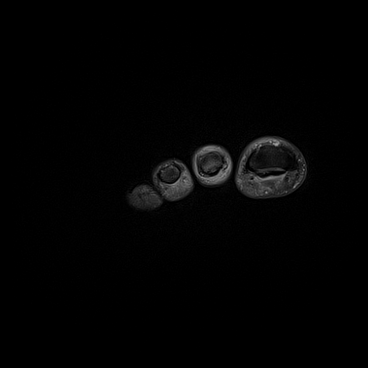
[im 18/60]
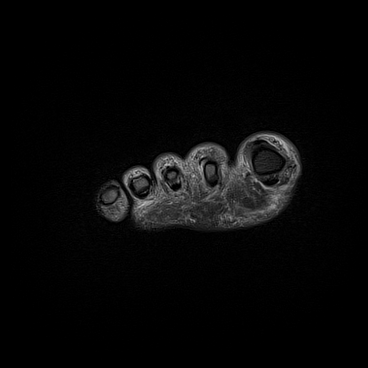
[im 24/60]
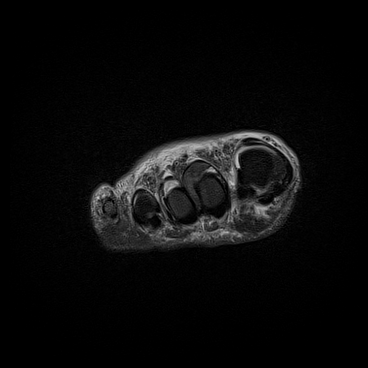
[im 30/60]
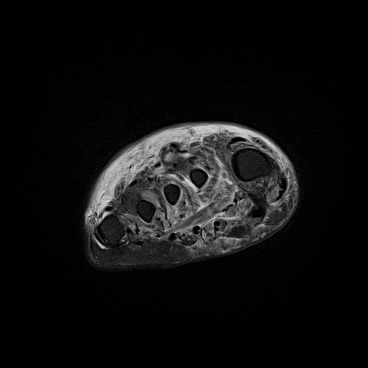
[im 36/60]
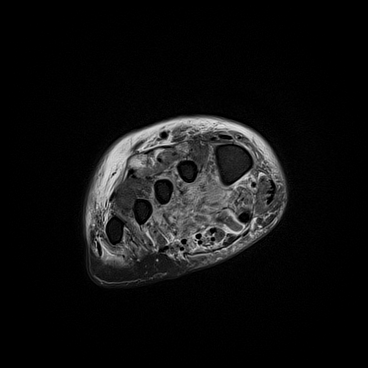
[im 42/60]
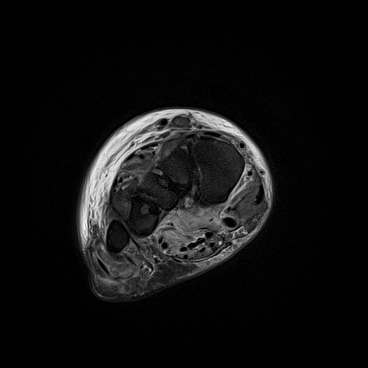
[im 48/60]
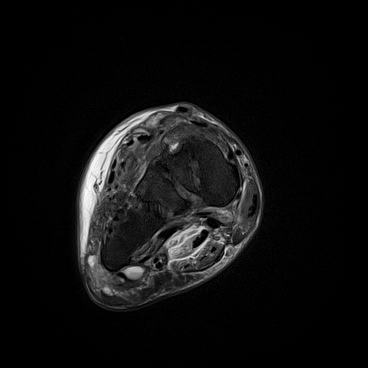
[im 54/60]
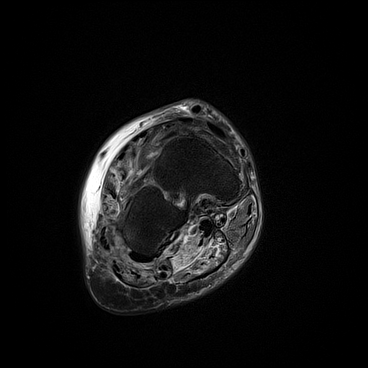
[im 60/60]
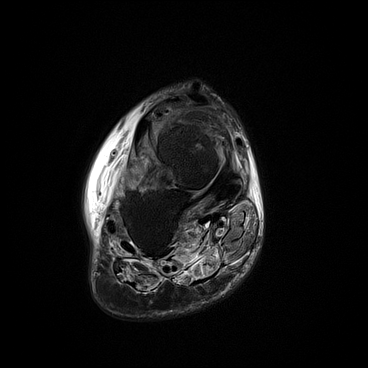

[Series 6: T1 · axial · right · 3.0mm · 0.55mm/px · z∈[-176,-56]mm · 6 of 36 slices shown (2 of 2)]
[im 1/36]
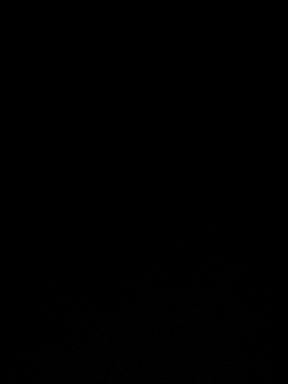
[im 8/36]
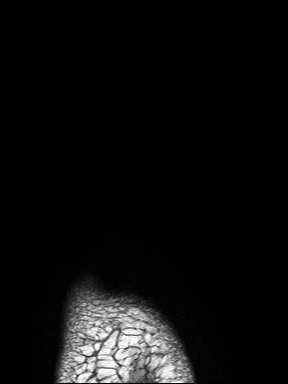
[im 15/36]
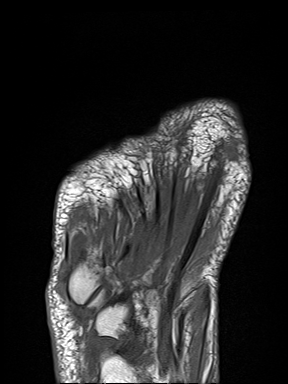
[im 22/36]
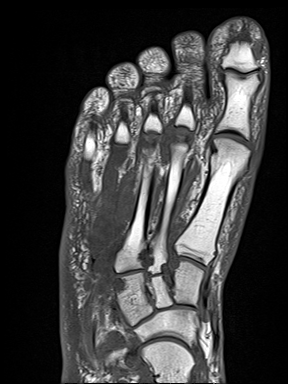
[im 29/36]
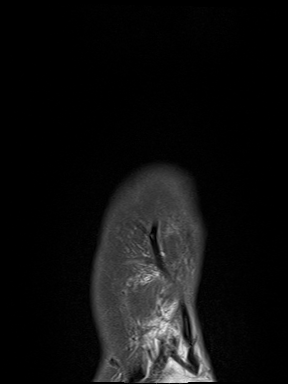
[im 36/36]
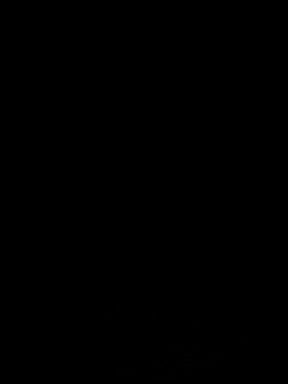

[Series 7: T2 fat-sat · axial · right · 3.0mm · 0.62mm/px · z∈[-183,-63]mm · 6 of 36 slices shown (2 of 2)]
[im 1/36]
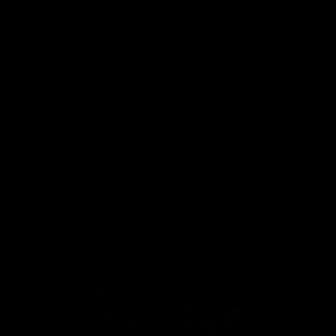
[im 8/36]
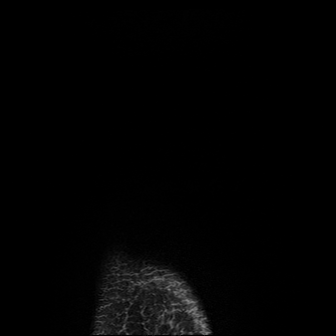
[im 15/36]
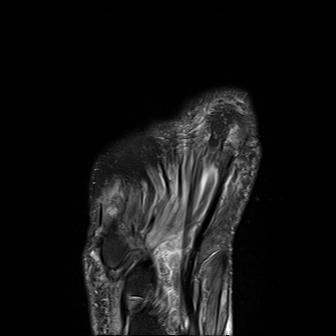
[im 22/36]
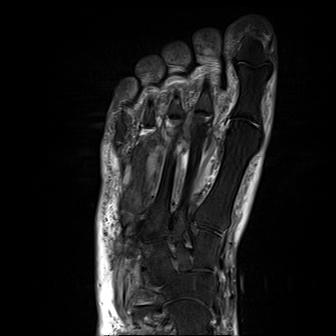
[im 29/36]
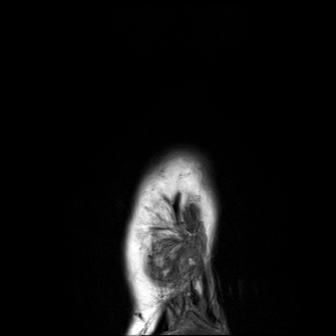
[im 36/36]
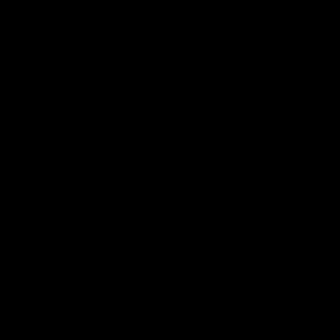

[Series 8: STIR · sagittal · right · 3.0mm · 0.78mm/px · 6 of 37 slices shown]
[im 1/37]
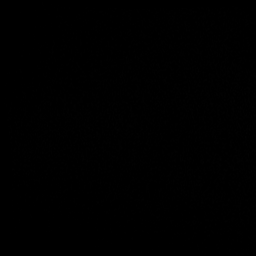
[im 8/37]
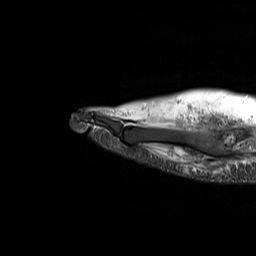
[im 15/37]
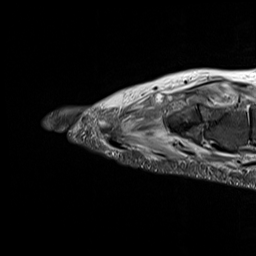
[im 22/37]
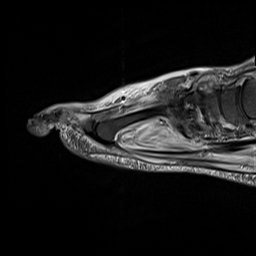
[im 29/37]
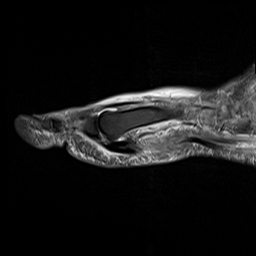
[im 37/37]
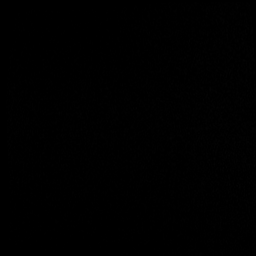

[40 of 40 positions shown; findings below may reference images not displayed]

FINDINGS: Bones/Joint/Cartilage

Diffuse subcutaneous soft tissue swelling/edema/fluid mainly along
the dorsum of the foot. No obvious discrete abscess.

There are masslike areas of soft tissue density on the dorsum of the
foot. A large masslike area is emanating between the first and
second metatarsal bases and protruding into the subcutaneous tissues
and the second area is around the dorsum of the fourth metatarsal.
These have relatively low T1 and T2 signal intensity and could be a
synovial process or fibrous tissue. Gout would certainly be a
consideration. Repeat study with contrast may be helpful for further
evaluation.

Diffuse edema like signal changes involving the foot musculature
suggesting myofasciitis. No obvious findings for pyomyositis. There
is a fluid collection surrounding the extensor tendon of the second
toe which is likely tenosynovitis but could not exclude septic
tenosynovitis.

No MR findings suspicious for septic arthritis or osteomyelitis.
There are age advanced midfoot degenerative changes with erosive
changes. Findings could be due to an erosive arthropathy or early
neuropathic disease.
IMPRESSION: 1. Diffuse subcutaneous soft tissue swelling/edema/fluid mainly
along the dorsum of the foot suggesting cellulitis. No obvious
discrete abscess.
2. Diffuse myofasciitis without definite findings for pyomyositis.
3. No MR findings for septic arthritis or osteomyelitis.
4. Age advanced midfoot degenerative changes with erosive changes.
Findings could be due to an erosive arthropathy such as gout or
early neuropathic disease.
5. Masslike densities on the dorsum of the foot have low T2 signal
intensity suggesting synovial process or fibrous tissue. Tophaceous
gout would certainly be a consideration. Repeat study with contrast
may be helpful.
6. Fluid collection surrounding the extensor tendon of the second
toe which is likely tenosynovitis.

## 2021-04-14 MED ORDER — METOPROLOL TARTRATE 25 MG PO TABS
25.0000 mg | ORAL_TABLET | Freq: Two times a day (BID) | ORAL | Status: DC
  Administered 2021-04-15 – 2021-04-30 (×31): 25 mg via ORAL
  Filled 2021-04-14 (×33): qty 1

## 2021-04-14 MED ORDER — FUROSEMIDE 10 MG/ML IJ SOLN
80.0000 mg | Freq: Once | INTRAMUSCULAR | Status: AC
  Administered 2021-04-14: 80 mg via INTRAVENOUS
  Filled 2021-04-14: qty 8

## 2021-04-14 NOTE — Progress Notes (Signed)
PROGRESS NOTE   Subjective/Complaints:   Pt's WBC is rising- no Sx's of infection- Tm=Tc 99.1.  Pt reports can stand again for short distances-  Also back hurts- still hurts- has hurt chronically, but esp since "woke up here in hospital".  Does get pain meds from Dr Jonelle Sidle outpt fyi.   Will call ID due to rising WBC.    ROS:   Pt denies SOB, abd pain, CP, N/V/C/D, and vision changes     Objective:   DG Chest 2 View  Result Date: 04/13/2021 CLINICAL DATA:  Leukocytosis. EXAM: CHEST - 2 VIEW COMPARISON:  03/21/2021. FINDINGS: Interval extubation. Removal of the previously seen enteric tube and right IJ sheath. Overall improved lung aeration with persistent but improved streaky opacities in the right suprahilar and left basilar lung. No visible pneumothorax or pleural effusion. Similar mild enlargement the cardiac silhouette. IMPRESSION: 1. Improved lung aeration with persistent but improved streaky opacities in the right suprahilar and left basilar lung. 2. Interval extubation with removal of enteric tube and right IJ sheath. Electronically Signed   By: Margaretha Sheffield MD   On: 04/13/2021 10:08   Recent Labs    04/13/21 0502 04/14/21 0541  WBC 14.7* 16.4*  HGB 7.9* 8.5*  HCT 25.4* 26.9*  PLT 191 194     Recent Labs    04/13/21 0502 04/14/21 0541  NA 134* 135  K 4.0 4.2  CL 100 100  CO2 25 24  GLUCOSE 124* 132*  BUN 36* 34*  CREATININE 1.44* 1.35*  CALCIUM 8.9 9.1    Intake/Output Summary (Last 24 hours) at 04/14/2021 1003 Last data filed at 04/14/2021 0732 Gross per 24 hour  Intake 948.63 ml  Output 3100 ml  Net -2151.37 ml         Physical Exam: Vital Signs Blood pressure (!) 143/92, pulse (!) 110, temperature 99.1 F (37.3 C), temperature source Oral, resp. rate 16, height '5\' 10"'$  (1.778 m), weight 84.1 kg, SpO2 99 %.     General: awake, alert, appropriate; sitting up in manual w/c; ,  NAD HENT: conjugate gaze; oropharynx moist CV: tachycardic rate; no JVD Pulmonary: CTA B/L; no W/R/R- good air movement GI: soft, NT, ND, (+)BS- slightly protuberant Psychiatric: appropriate- less irritable Neurological: alert   Ext: 3+ pedal edema and 1-2+ edema to knees B/L  Psych: pleasant and cooperative  Musculoskeletal: TTP over midline low back- not c/w CVA tenderness    Comments: Bilateral feet  with 2+ edema left>right. Ues- deltoids and biceps 4-/5, Triceps 4-/5, WE/grip and finger abd 4+/5 all B/L LE's - HF 2+/5, KE 3-/5, PF 3-/5, DF 2/5 (Pain?) and PF 2/5 (pain?)- all B/L L shoulder- TTP on top/AC joint and RTC/posterior Skin:    General: Skin is warm and dry.    Comments: multiple scars on leg   Neurological:    Mental Status: He is alert and oriented to person, place, and time.    Comments: Appears decreased sensation to light touch in LE's from knees down, but pt wasn't clear  - same as what was on admission- decreased to LT in LE's worse on LLE.    Assessment/Plan: 1. Functional deficits which require  3+ hours per day of interdisciplinary therapy in a comprehensive inpatient rehab setting. Physiatrist is providing close team supervision and 24 hour management of active medical problems listed below. Physiatrist and rehab team continue to assess barriers to discharge/monitor patient progress toward functional and medical goals  Care Tool:  Bathing    Body parts bathed by patient: Right arm, Left arm, Chest, Abdomen, Right upper leg, Left upper leg, Face, Front perineal area, Right lower leg, Left lower leg, Buttocks   Body parts bathed by helper: Buttocks, Right lower leg, Left lower leg     Bathing assist Assist Level: Set up assist (per most recent staff documentation)     Upper Body Dressing/Undressing Upper body dressing   What is the patient wearing?: Pull over shirt    Upper body assist Assist Level: Independent with assistive device (per most  recent staff documentation)    Lower Body Dressing/Undressing Lower body dressing      What is the patient wearing?: Underwear/pull up, Pants     Lower body assist Assist for lower body dressing: Independent with assitive device (per most recent staff documentation)     Toileting Toileting    Toileting assist Assist for toileting: Independent with assistive device     Transfers Chair/bed transfer  Transfers assist     Chair/bed transfer assist level: Independent with assistive device Chair/bed transfer assistive device: Museum/gallery exhibitions officer assist      Assist level: Supervision/Verbal cueing Assistive device: Walker-rolling Max distance: 300 ft   Walk 10 feet activity   Assist     Assist level: Independent with assistive device Assistive device: Walker-rolling   Walk 50 feet activity   Assist Walk 50 feet with 2 turns activity did not occur: Safety/medical concerns  Assist level: Independent with assistive device Assistive device: Walker-rolling    Walk 150 feet activity   Assist Walk 150 feet activity did not occur: Safety/medical concerns  Assist level: Supervision/Verbal cueing Assistive device: Walker-rolling    Walk 10 feet on uneven surface  activity   Assist Walk 10 feet on uneven surfaces activity did not occur: Safety/medical concerns   Assist level: Supervision/Verbal cueing Assistive device: Aeronautical engineer Will patient use wheelchair at discharge?: Yes Type of Wheelchair: Manual Wheelchair activity did not occur: Refused (Pt stated he would not leave his room)  Wheelchair assist level: Independent Max wheelchair distance: 500 ft    Wheelchair 50 feet with 2 turns activity    Assist    Wheelchair 50 feet with 2 turns activity did not occur: Refused   Assist Level: Independent   Wheelchair 150 feet activity     Assist  Wheelchair 150 feet activity did not  occur: Refused   Assist Level: Independent   Blood pressure (!) 143/92, pulse (!) 110, temperature 99.1 F (37.3 C), temperature source Oral, resp. rate 16, height '5\' 10"'$  (1.778 m), weight 84.1 kg, SpO2 99 %.  Medical Problem List and Plan: 1.  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 Maybe earlier- supervision to mod I- likely needs Mod I  -Continue CIR therapies including PT, OT, and SLP  8/9- according to therapy, has been discharged from PT and OT- was mod I-  will hopefully be ready for d/c in next 1-2 days-   8/10- con't mod I in room 2.  Impaired mobility, ambulating >200 feet: d/c Lovenox 3. Left shoulder/back/foot pain/Pain/diabetic peripheral neuropathy: continue Oxycodone prn. Increase steroids for gout flare. Will do 60 mg daily x 2 days, then taper. Pt rates pain as "11/10". Needs to bring in shoes so able to walk better; If steroids PO doesn't work, might need L shoulder injection. Pt hates needles.   7/27- pt reports tylenol helpful- con't regimen  8/1- added Lidoderm patches for Shoulder and back pain. 8pm to 8am  8/6 increase gabapentin to '300mg'$  TID  8/9- pt insistent "oxy doesn't work- but already taking 10 mg q4 hours around the clock- will not increase.  8/10- has had pain chronically- con't regimen 4. Mood: LCSW to follow for evaluation and support.              -antipsychotic agents: N/A 5. Neuropsych: This patient is capable of making decisions on his own behalf. 6. Skin/Wound Care: Routine pressure relief measures.  7. Fluids/Electrolytes/Nutrition: Strict I/O. Check CMET in am 8. PJP PNA:  Duration of meds discussed with pharmacy who relayed that patient has completed 21+ days of steroids and primaquine. --Clindamycin thorough 07/29 to complete 21 days. 8/10- calling ID due to rising WBC to 16.4- ID consulted- labs in  am-xrays and blood Cx's 9. Gout flare: Was on colchicine 0.6 mg daily PTA. --prednisone taper almost completed 8/9- will give colchicine dose x2 again to see if will help feet pain.  10. T2DM: Hgb A1c- 5.4. Was on glucotrol/metformin bid PTA.             --continue Lantus while on steroids with SSI for elevated BS.  7/27- BG's 94-159- good considering steroids- con't to monitor  8/6- BG's controlled, in spite of steroids- con't regimen 11. Anxiety d/o: Was on Klonopin TID PTA. --bid effective at this time. 12. HTN: Monitor BP tid--continue hydralazine and Norvasc.  7/27- BP slightly elevated- will monitor for trend.   8/6- BP under fair/borderline control 13. HIV: On Epivir and Tivicay 14. Anemia: H/H steadily improving. Recheck H/H in am.  7/27- Hb 8.9- still above a few days ago- con't to monitor  8/1- Hb back down to 7.9- has gone up and donw 1 unit- if still down next week, might need more intervention? 15. Hypomagnesemia: Was supplemented yesterday. --Recheck in am.  7/27- wasn't done/ordered- so will check in AM- last Mg 1.3- will give Magnesium based on labs in AM.   7/28- will add MgOx 400 mg BID and recheck qMonday.   8/1- pt refuses more labs except for 1x/week so cannot do IV_ will increase Mg, in spite of fact Mg still 1.3- like last week. This is a CHRONIC low Mg level, FYI.   8/8- Mg 1.1- has to go home on PO Mg. Will see if can convince him to get IV Mg.  16. Intermittent Leucocytosis: Monitor for fevers and other signs of infection.   7/27- WBC 10.3- will monitor  8/1- WBC 11.0- but no signs/Sx's of infection- afebrile  8/9- WBC 14.7- U/A and CXR (-)- will recheck in AM- no fever (Tm 99.3- per staff, covered in blankets). Will monitor and recheck labs 17. Dysphagia- improved- now just passed to regular diet with thin liquids as of today. 18. Urinary retention- has failed at least 1 voiding trial on 7/20- on Flomax 0.4 mg nightly- will make sure has been on 7 days before  trying another voiding trial.  7/29- Flomax dropped off when got here, so don't remove foley until at least 8/1- since restarted 7/28   voiding   8/8- is voiding- foley out.  19. Bowel incontinence- will see if can help pt become continent with improved function.   7/27- nursing got up to [facility] last night.  20. Chipped nails- will see if can help?  7/28- says it's better after Olin Hauser helping him.  21. GERD  87/28- will restart Protonix- also has tums prn. 22. Pressure injury to buttocks: offload q97mnutes 23. Bilateral lower extremity swelling: ice, elevated, compression garments 24. Shoulder pain: continue lidocaine patch and voltaren gel.  25. Nearsighted- schedule outpatient ophthalmology eval- should be checked for diabetic retinopathy 26. Diabetic peripheral neuropathy: will benefit from outpatient Qutenza. Ordered diabetic shoes  -gabapentin increased as above  8/9- likely cause of some of pain- doesn't respond to oxy 27. Lower extremity edema:resumed '10mg'$  daily Lasix.  28. AKI:   8/8- Cr 1.38- back and forth- from 1.07 to 1.40- could be Septra??? But will let him f/u with ID/or get PCP.   8/9- a lot of LE edema- s/p Lasix 80 mg IV again- Renal following- appreciate their input. BMP in AM   8/10- edema is improving- waiting for nephrology note    LOS: 15 days A FACE TO FACE EVALUATION WAS PERFORMED  Haylea Schlichting 04/14/2021, 10:03 AM

## 2021-04-14 NOTE — Progress Notes (Signed)
Nephrology Progress Note:   Subjective: He had 3.1 liters UOP over 8/9.  He wants to go home but states he can't walk due to leg pain and swelling.   Review of systems:     Denies shortness of breath  Decreased mobility Denies n/v Denies cp  Urinating quite a bit  Objective Vital signs in last 24 hours: Vitals:   04/13/21 0700 04/13/21 1309 04/13/21 1930 04/14/21 0535  BP: 137/90 (!) 139/91 133/90 (!) 143/92  Pulse: (!) 101 (!) 108 (!) 103 (!) 110  Resp: '18 19 18 16  '$ Temp: 97.9 F (36.6 C) 99.1 F (37.3 C) 98.8 F (37.1 C) 99.1 F (37.3 C)  TempSrc: Oral  Oral Oral  SpO2: 100% 97% 100% 99%  Weight:      Height:       Weight change:   Intake/Output Summary (Last 24 hours) at 04/14/2021 1124 Last data filed at 04/14/2021 0732 Gross per 24 hour  Intake 948.63 ml  Output 3100 ml  Net -2151.37 ml    Assessment/ Plan: Pt is a 45 y.o. yo male with HIV/AIDS, DM, baseline CKD who was admitted on 03/30/2021 with acute hypoxic resp failure with sepsis and AKI.  New diagnosis HIV and with PJP PNA during acute hospitalization now s/p course of abx for same   Assessment/Plan: 1. Hx AKI  AKI in the setting of hypoxic resp failure, ATN with septic shock and also urinary retention.  Off and on CRRT-  most recently stopped on 7/15 and temp HD catheter out.  Note 02/2020 Cr 2.80 and 05/2011 Cr 0.90 - only prior labs in [facility] system.  reported baseline crt in the high 2's however this was based on limited data and may have just been reflective of prior AKI episode.   - Lasix 80 mg IV once again this afternoon - Then try to transition to lasix 40 mg daily tomorrow  - strict ins/outs ordered - daily weights are not available   2. Chronic hypoxic resp failure s/p trach - now decannulated  3. HIV - regimen per primary team/ID  4. HTN - above goal and with overload; diuresis as above   5. Anemia normocytic - no acute need for PRBC's or ESA.  Iron deficiency. IV iron x 2 doses ordered  6.  Impaired mobility - per CIR team  7.  Urinary retention-  continue flomax and note foley is removed  8. Hypomagnesemia  - repleted   Labs: Basic Metabolic Panel: Recent Labs  Lab 04/12/21 0644 04/13/21 0502 04/14/21 0541  NA 137 134* 135  K 3.8 4.0 4.2  CL 104 100 100  CO2 '24 25 24  '$ GLUCOSE 137* 124* 132*  BUN 33* 36* 34*  CREATININE 1.38* 1.44* 1.35*  CALCIUM 8.6* 8.9 9.1   Liver Function Tests: Recent Labs  Lab 04/12/21 0644  ALBUMIN 2.8*    No results for input(s): LIPASE, AMYLASE in the last 168 hours. No results for input(s): AMMONIA in the last 168 hours. CBC: Recent Labs  Lab 04/13/21 0502 04/14/21 0541  WBC 14.7* 16.4*  NEUTROABS  --  12.7*  HGB 7.9* 8.5*  HCT 25.4* 26.9*  MCV 95.8 95.7  PLT 191 194    Cardiac Enzymes: No results for input(s): CKTOTAL, CKMB, CKMBINDEX, TROPONINI in the last 168 hours. CBG: Recent Labs  Lab 04/13/21 0544 04/13/21 1122 04/13/21 1636 04/13/21 2120 04/14/21 0635  GLUCAP 116* 143* 150* 109* 108*    Iron Studies:  Recent Labs  04/12/21 1528  IRON 18*  TIBC 270  FERRITIN 866*    Studies/Results: DG Chest 2 View  Result Date: 04/13/2021 CLINICAL DATA:  Leukocytosis. EXAM: CHEST - 2 VIEW COMPARISON:  03/21/2021. FINDINGS: Interval extubation. Removal of the previously seen enteric tube and right IJ sheath. Overall improved lung aeration with persistent but improved streaky opacities in the right suprahilar and left basilar lung. No visible pneumothorax or pleural effusion. Similar mild enlargement the cardiac silhouette. IMPRESSION: 1. Improved lung aeration with persistent but improved streaky opacities in the right suprahilar and left basilar lung. 2. Interval extubation with removal of enteric tube and right IJ sheath. Electronically Signed   By: Margaretha Sheffield MD   On: 04/13/2021 10:08   Medications: Infusions:    Scheduled Medications:  (feeding supplement) PROSource Plus  30 mL Oral TID BM    amLODipine  10 mg Oral Daily   clonazePAM  0.5 mg Oral TID   dapsone  100 mg Oral Daily   diclofenac Sodium  2 g Topical QID   dolutegravir  50 mg Oral Daily   gabapentin  300 mg Oral Q12H   hydrALAZINE  25 mg Oral Q8H   insulin aspart  0-15 Units Subcutaneous TID WC   insulin aspart  0-5 Units Subcutaneous QHS   insulin glargine-yfgn  5 Units Subcutaneous Daily   lamiVUDine  300 mg Oral Daily   lidocaine  1 patch Transdermal Q24H   lidocaine  2 patch Transdermal Q24H   magnesium oxide  400 mg Oral BID   multivitamin with minerals  1 tablet Oral Daily   pantoprazole  40 mg Oral Daily   predniSONE  5 mg Oral Q breakfast   tamsulosin  0.4 mg Oral QPC supper    have reviewed scheduled and prn medications.  Physical Exam:  General adult male in bed in no acute distress HEENT normocephalic atraumatic extraocular movements intact sclera anicteric Neck supple trachea midline; bandage over old trach site Lungs clear to auscultation bilaterally normal work of breathing at rest on room air Heart S1S2 no rub Abdomen soft nontender nondistended Extremities 1-2+ edema; legs are wrapped Psych normal mood and affect Neuro - awake and conversant; follows commands and provides hx   Claudia Desanctis, MD 04/14/2021,11:24 AM

## 2021-04-14 NOTE — Patient Care Conference (Signed)
Inpatient RehabilitationTeam Conference and Plan of Care Update Date: 04/13/2021   Time: 11:32 AM    Patient Name: Bruce Hunter      Medical Record Number: [id number]  Date of Birth: 03-Jul-1976 Sex: Male         Room/Bed: 4M09C/4M09C-01 Payor Info: Payor: [place] / Plan: [place] MEDICAID [facility] / Product Type: *No Product type* /    Admit Date/Time:  03/30/2021  2:52 PM  Primary Diagnosis:  Intensive care (ICU) myopathy  Hospital Problems: Principal Problem:   Intensive care (ICU) myopathy Active Problems:   AIDS (acquired immune deficiency syndrome) ([place])   Debility   Gout attack    Expected Discharge Date: Expected Discharge Date: 04/14/21 (or 04/15/2021)  Team Members Present: Physician leading conference: Dr. Courtney Heys Social Worker Present: Ovidio Kin, LCSW Nurse Present: Dorthula Nettles, RN PT Present: Ailene Rud, PT OT Present: Lillia Corporal, OT PPS Coordinator present : Gunnar Fusi, SLP     Current Status/Progress Goal Weekly Team Focus  Bowel/Bladder   continent Bowel and bladder LBM 8/8  remain continent  Assess toileting needs Q4hours and PRN   Swallow/Nutrition/ Hydration             ADL's             Mobility             Communication             Safety/Cognition/ Behavioral Observations  Mod I- Supervision A - goal met  Mod I recall, supervision A problem solving and awareness      Pain   pain reported at a consistent 10/10 managed with oxycodone 10 mg and tylenol 650 Q4H to a 6/10  Maintain pain goal of less than 5  Assess pain Q4 hours and as needed   Skin   Stage 2 to buttocks healed  no new skin breakdown  Asess skin Qshift and PRN     Discharge Planning:  Medical issues keeping pt here, once medically stable will be DC home   Team Discussion: WBC 14.7, kidney function up 1.44. Back pain, UA neg, CXR ? Recheck labs tomorrow. Getting diuresed, 3-4+ edema B-feet. Continent B/B with urgency. Grad day with PT/OT  was 04/11/2021. Grad day with SLP was 04/10/2021. Independent in room. Family education complete. Patient on target to meet rehab goals: yes  *See Care Plan and progress notes for long and short-term goals.   Revisions to Treatment Plan:  Giving IV lasix, 2 doses Colchicine. Teaching Needs: Family education complete.  Current Barriers to Discharge: Decreased caregiver support, Medical stability, Home enviroment access/layout, Weight, and Medication compliance  Possible Resolutions to Barriers: Continue current medications, provide emotional support.     Medical Summary Current Status: LE edema- much worse- working with IV Lasix;  continent B/B- urinary urgency- c/o back pain- has chronic pain- "siad oxy doesn't work"- always has pain; also having another gout flare;  Barriers to Discharge: Decreased family/caregiver support;Home enviroment access/layout;Medical stability;Weight  Barriers to Discharge Comments: gout flare  and LE edema and WBC up to 14.7- Possible Resolutions to [employer]: d/c;d from OT/PT and SLP; gout flare- will give 2 doses colchicine; foot pain worse- on IV Lasix as well.  d/c tomorrow or Thursday   Continued Need for Acute Rehabilitation Level of Care: The patient requires daily medical management by a physician with specialized training in physical medicine and rehabilitation for the following reasons: Direction of a multidisciplinary physical rehabilitation program to  maximize functional independence : Yes Medical management of patient stability for increased activity during participation in an intensive rehabilitation regime.: Yes Analysis of laboratory values and/or radiology reports with any subsequent need for medication adjustment and/or medical intervention. : Yes   I attest that I was present, lead the team conference, and concur with the assessment and plan of the team.   Cristi Loron 04/14/2021, 4:11 PM

## 2021-04-15 DIAGNOSIS — D72829 Elevated white blood cell count, unspecified: Secondary | ICD-10-CM

## 2021-04-15 DIAGNOSIS — M609 Myositis, unspecified: Secondary | ICD-10-CM

## 2021-04-15 DIAGNOSIS — M7989 Other specified soft tissue disorders: Secondary | ICD-10-CM

## 2021-04-15 LAB — CBC WITH DIFFERENTIAL/PLATELET
Abs Immature Granulocytes: 0.18 10*3/uL — ABNORMAL HIGH (ref 0.00–0.07)
Basophils Absolute: 0 10*3/uL (ref 0.0–0.1)
Basophils Relative: 0 %
Eosinophils Absolute: 0.3 10*3/uL (ref 0.0–0.5)
Eosinophils Relative: 2 %
HCT: 28.4 % — ABNORMAL LOW (ref 39.0–52.0)
Hemoglobin: 9 g/dL — ABNORMAL LOW (ref 13.0–17.0)
Immature Granulocytes: 1 %
Lymphocytes Relative: 7 %
Lymphs Abs: 1.2 10*3/uL (ref 0.7–4.0)
MCH: 30 pg (ref 26.0–34.0)
MCHC: 31.7 g/dL (ref 30.0–36.0)
MCV: 94.7 fL (ref 80.0–100.0)
Monocytes Absolute: 2 10*3/uL — ABNORMAL HIGH (ref 0.1–1.0)
Monocytes Relative: 12 %
Neutro Abs: 13.6 10*3/uL — ABNORMAL HIGH (ref 1.7–7.7)
Neutrophils Relative %: 78 %
Platelets: 189 10*3/uL (ref 150–400)
RBC: 3 MIL/uL — ABNORMAL LOW (ref 4.22–5.81)
RDW: 20.2 % — ABNORMAL HIGH (ref 11.5–15.5)
WBC: 17.3 10*3/uL — ABNORMAL HIGH (ref 4.0–10.5)
nRBC: 0 % (ref 0.0–0.2)

## 2021-04-15 LAB — BASIC METABOLIC PANEL
Anion gap: 14 (ref 5–15)
BUN: 43 mg/dL — ABNORMAL HIGH (ref 6–20)
CO2: 25 mmol/L (ref 22–32)
Calcium: 9.6 mg/dL (ref 8.9–10.3)
Chloride: 95 mmol/L — ABNORMAL LOW (ref 98–111)
Creatinine, Ser: 1.74 mg/dL — ABNORMAL HIGH (ref 0.61–1.24)
GFR, Estimated: 49 mL/min — ABNORMAL LOW (ref >60)
Glucose, Bld: 98 mg/dL (ref 70–99)
Potassium: 4.2 mmol/L (ref 3.5–5.1)
Sodium: 134 mmol/L — ABNORMAL LOW (ref 135–145)

## 2021-04-15 LAB — FUNGUS CULTURE WITH STAIN

## 2021-04-15 LAB — GLUCOSE, CAPILLARY
Glucose-Capillary: 134 mg/dL — ABNORMAL HIGH (ref 70–99)
Glucose-Capillary: 162 mg/dL — ABNORMAL HIGH (ref 70–99)
Glucose-Capillary: 133 mg/dL — ABNORMAL HIGH (ref 70–99)
Glucose-Capillary: 129 mg/dL — ABNORMAL HIGH (ref 70–99)

## 2021-04-15 LAB — FUNGUS CULTURE RESULT

## 2021-04-15 LAB — FUNGAL ORGANISM REFLEX

## 2021-04-15 NOTE — Evaluation (Signed)
Occupational Therapy Assessment and Plan  Patient Details  Name: Bruce Hunter MRN: [id number] Date of Birth: [birth year]  OT Diagnosis: acute pain, swelling of limb, and impaired sensation Rehab Potential: Rehab Potential (ACUTE ONLY): Fair ELOS: ~3 days   Today's Date: 04/16/2021 OT Individual Time: 1537-9432 and 1435-1535 OT Individual Time Calculation (min): 55 min  and 60 min  Hospital Problem: Principal Problem:   Intensive care (ICU) myopathy Active Problems:   AIDS (acquired immune deficiency syndrome) ([place])   Debility   Gout attack   Leukocytosis   Bilateral swelling of feet   Past Medical History:  Past Medical History:  Diagnosis Date   Asthma    Diabetes mellitus without complication ([place])    Gout    Hyperlipidemia    Hypertension    Obesity    Past Surgical History:  Past Surgical History:  Procedure Laterality Date   BIOPSY  02/15/2021   Procedure: BIOPSY;  Surgeon: Ronnette Juniper, MD;  Location: [facility];  Service: Gastroenterology;;   COLONOSCOPY WITH PROPOFOL N/A 02/15/2021   Procedure: COLONOSCOPY WITH PROPOFOL;  Surgeon: Ronnette Juniper, MD;  Location: [facility];  Service: Gastroenterology;  Laterality: N/A;   ESOPHAGOGASTRODUODENOSCOPY (EGD) WITH PROPOFOL N/A 02/15/2021   Procedure: ESOPHAGOGASTRODUODENOSCOPY (EGD) WITH PROPOFOL;  Surgeon: Ronnette Juniper, MD;  Location: [facility];  Service: Gastroenterology;  Laterality: N/A;   GANGLION CYST EXCISION Right 02/26/2020   Procedure: EXCISION TOPHUS RIGHT FOOT;  Surgeon: Evelina Bucy, DPM;  Location: [place];  Service: Podiatry;  Laterality: Right;   NO PAST SURGERIES      Assessment & Plan Clinical Impression: Bruce Hunter is a 45 year old male with history of HTN, T2DM with neuropathy,  gout, asthma, recent admission 02/12/21 with N/V, 60 lbs wt loss and new diagnosis of advanced HIV and candida esophagitis. He was discharged to home on 06/13 but readmitted on 03/04/21 with acute  hypoxemic respiratory failure with hypotension, acute renal failure with hyperkalemia and peripheral edema due to sepsis from bilateral pneumocystis pneumonia.  He was started on broad spectrum antibiotics and steroids for ARDs and ultimately required intubation on  07/01 due to high oxygen needs with progressive hypoxia and hypotension requiring pressors.  Renal ultrasound without hydronephrosis and nephrology felt that AKI due to ATN from sepsis. He was started on CRRT due to azotemia with hyperkalemia. EEG done due to right gaze preference and showed evidence of moderate to severe diffuse encephalopathy.   ID following for input and he was started on HRT. He continued to have high fevers with leucocytosis and agitation therefore LP done which was negative for meningitis/CNS involvement. Bronchoscopy with tracheostomy performed by Dr. [place] Bells on 07/13 and he tolerated extubation and decannulation by 07/26 due to trach dislodgement.  Bronch cultures with MSSA/MRSE which was treated with Zyvox. HIV/AIDS has responded well to Dovato "after imperfect adherence with VDRF/ARF in setting of sepsis" per [facility]. To continue Dovato and 21 day course of Clindamycin and Primaquine as well as prednisone to treat PCP (PJP ag/PCR felt to be false negative).   Foley in place due eo recurrent urinary retention and Flomax added. Severe anemia treated with iron load and low dose aranesp. CRRT stopped and urine output improving. 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 Severe foot pain felt to be due to gout and he continues on oxycodone and steroids for pain management. On D3,  honey liquids due to dysphagia with MBS for today showing improvement-->now on regular diet.  Therapy ongoing and patient limited by severe pain in LLE, BLE weakness and working on pregait/standing attempts in [place]. CIR recommended  due to functional decline.    Patient currently requires min with basic self-care skills secondary to pain exacerbation  Prior to 2nd OT evaluation, patient could complete BADLs with modified independent .  Patient will benefit from skilled intervention to increase independence with basic self-care skills prior to discharge home or to skilled placement.  Anticipate patient will require intermittent supervision and follow up home health.  OT - End of Session Endurance Deficit: Yes OT Assessment Rehab Potential (ACUTE ONLY): Fair OT Barriers to Discharge: Decreased caregiver support;Home environment access/layout;Lack of/limited family support;Insurance for SNF coverage;Other (comments) (uncontrolled pain) OT Patient demonstrates impairments in the following area(s): Balance;Behavior;Edema;Endurance;Motor;Pain;Safety;Sensory;Skin Integrity;Cognition OT Basic ADL's Functional Problem(s): Other (comment) (functional sit<stands and functional standing balance) OT Advanced ADL's Functional Problem(s): Other (comment) (pt reports his family/friends will assist with IADLs) OT Transfers Functional Problem(s): Other (comment) (no functional transfer goal set, using clinical judgment pt would be Mod I if pain was under control) OT Additional Impairment(s): None OT Plan OT Intensity: Minimum of 1-2 x/day, 45 to 90 minutes OT Frequency: 5 out of 7 days OT Duration/Estimated Length of Stay: ~3 days OT Treatment/Interventions: Balance/vestibular training;DME/adaptive equipment instruction;Patient/family education;Therapeutic Activities;Wheelchair propulsion/positioning;Therapeutic Exercise;Psychosocial support;Functional electrical stimulation;Cognitive remediation/compensation;Functional mobility training;Community reintegration;Self Care/advanced ADL retraining;UE/LE Strength taining/ROM;UE/LE Coordination activities;Skin care/wound managment;Neuromuscular re-education;Discharge planning;Disease  mangement/prevention;Pain management;Visual/perceptual remediation/compensation OT Self Feeding Anticipated Outcome(s): No goal OT Basic Self-Care Anticipated Outcome(s): No goal OT Toileting Anticipated Outcome(s): No goal OT Bathroom Transfers Anticipated Outcome(s): Mod I toilet transfer OT Recommendation Recommendations for Other Services: Other (comment) (none) Follow Up Recommendations: Home health OT;Skilled nursing facility Equipment Recommended: None recommended by OT   OT Evaluation Precautions/Restrictions  Precautions Precaution Comments: Hypersentive and painful feet General   Vital Signs Therapy Vitals Temp: 99.5 F (37.5 C) Pulse Rate: (!) 110 Resp: 16 BP: 123/79 Patient Position (if appropriate): Sitting Oxygen Therapy SpO2: 98 % O2 Device: Room Air Pain Pain Assessment Pain Scale: 0-10 Pain Score: 10-Worst pain ever Pain Location: Foot Pain Orientation: Right;Left Pain Intervention(s): Medication (See eMAR) Home Living/Prior Functioning Home Living Family/patient expects to be discharged to:: Private residence Living Arrangements: Non-relatives/Friends Available Help at Discharge: [place], Friend(s), Family, Available PRN/intermittently, Available 24 hours/day (pt may have to d/c SNF if he not at Mod I level) Type of Home: Apartment Home Access: Level entry Home Layout: One level Bathroom Shower/Tub: Multimedia programmer: Standard Bathroom Accessibility: Yes Additional Comments: Pt reports he cannot d/c to handicapped housing option. If he is unable to access his home (+stairs), pts family will opt for nursing home/skilled placement per pt report  Lives With: Alone IADL History Homemaking Responsibilities: Yes Current License: Yes Prior Function Level of Independence: Independent with gait, Independent with transfers, Independent with basic ADLs  Able to Take Stairs?: Yes Driving: Yes Vocation: On disability Vision    Perception  Perception: Within Functional Limits Praxis Praxis: Impaired Cognition Arousal/Alertness: Awake/alert Orientation Level: Person;Place;Situation Person: Oriented Place: Oriented Situation: Oriented Year: 2022 Month: August Day of Week: Correct Behaviors: Restless;Impulsive;Poor frustration tolerance Safety/Judgment: Impaired Sensation Sensation Light Touch: Impaired by gross assessment (B LEs, feet, hypersensitive to touch) Coordination Gross Motor Movements are Fluid and Coordinated: No Fine Motor Movements are Fluid and Coordinated: Yes Coordination and Movement Description: Affected by pain + swelling Motor  Motor Motor:  Abnormal postural alignment and control Motor - Skilled Clinical Observations: Guarded motor movements due to pain  Trunk/Postural Assessment  Cervical Assessment Cervical Assessment: Within Functional Limits Thoracic Assessment Thoracic Assessment: Within Functional Limits Lumbar Assessment Lumbar Assessment: Within Functional Limits Postural Control Postural Control: Within Functional Limits  Balance Dynamic Sitting Balance Dynamic Sitting - Balance Support: Feet supported Dynamic Sitting - Level of Assistance: 5: Stand by assistance (using urinal EOB) Dynamic Standing Balance Dynamic Standing - Balance Support: Right upper extremity supported Dynamic Standing - Level of Assistance: 4: Min assist Dynamic Standing - Balance Activities: Lateral lean/weight shifting;Forward lean/weight shifting;Reaching for objects Extremity/Trunk Assessment RUE Assessment RUE Assessment: Within Functional Limits LUE Assessment LUE Assessment: Within Functional Limits (does have Lt shoulder pain)  Care Tool Care Tool Self Care Eating        Oral Care    Oral Care Assist Level: Independent (per pt report)    Bathing   Body parts bathed by patient: Right arm;Left arm;Chest;Abdomen;Right upper leg;Left upper leg;Face;Front perineal area;Right  lower leg;Left lower leg;Buttocks     Assist Level: Independent with assistive device (bedlevel, clinical judgement)    Upper Body Dressing(including orthotics)   What is the patient wearing?: Pull over shirt   Assist Level: Independent with assistive device (bedlevel, clinical judgement)    Lower Body Dressing (excluding footwear)   What is the patient wearing?: Underwear/pull up;Pants Assist for lower body dressing: Independent with assitive device (bedlevel, clinical judgement)    Putting on/Taking off footwear   What is the patient wearing?: Non-skid slipper socks Assist for footwear: [place] Toileting activity   Assist for toileting: Independent with assistive device (per PT)        Toilet transfer   Assist Level: Contact Guard/Touching assist (per evaluating PT)      Refer to Care Plan for [place] 1 OT Short Term Goal 1 (Week 1): ELOS=STGs due to ELOS  Recommendations for other services: None    Skilled Therapeutic Intervention Skilled OT session completed with focus on initial evaluation, education on OT role/POC, and establishment of patient-centered goals.   Pt greeted in bed, feet elevated. He stated his swelling was better today, worse in the past few days and he was unable to take steps due to worsening of swelling + overall pain on day of d/c. He reported the swelling got better with ice + elevation. Pt medicated for pain at start of session by RN and then was agreeable to participate. Pt able to doff/don gripper socks, OT ACE wrapped feet for compression after he doffed his socks. Pt still hypersensitive to the touch in both feet, pt stating that this is worse compared to last week. Supine<sit completed unassisted and then he donned a new shirt with setup. Heavy Min A for stand pivot<w/c with pt shuffling more than stepping using RW heavily for support. While seated at the sink, pt completed grooming  tasks with setup. He stated that his home is w/c accessible excluding bathroom, will need to use the RW for access. Pt adamant that he will just have "friends" assist him into the bathroom as needed, OT still recommended using the RW for safety and independence. Pt refused to attempt ambulating down the hallway due to pain, self propelled his w/c to the laundry room. Heavy Min A for sit<stand with pt exhibiting posterior lean to decrease LE weightbearing. Unilateral support on washer needed for standing balance in  order to minimize pain while he loaded clothing. He then returned to the w/c and self propelled back to his room. We discussed importance of routine edema mgt to maximize his functional independence at time of d/c, including elevating feet, using compression wraps, + cryotherapy. Provided pt with ice packs for feet, also issued leg lifter so that he can work on gentle ankle mobility and LE elevation while OOB. Pt does not like his recliner. Left him with all needs within reach.   2nd Session 1:1 tx (60 min) Pt greeted in bed, stating he had so much pain that he "could cry." Visibly shaking from pain. Asking for ice packs. OT provided pt with ice packs for feet, education provided on MD order to elevate, compress, and ice feet (cryotherapy x15 min daily). Provided a handout regarding edema mgt strategies. Strongly encouraged pt to continue with edema mgt during his daily routine at home to maximize functional transfer capabilities. Per RN, pt not due for pain medicine at this time, aware of his pain symptoms. OT also informed PA. Pt declined all OOB mobility. Therefore holistic pain mgt provided, discussed two arrows of pain perception and how using coping strategies could help dampen pts pain perception. Guided him through a meditation exercise, emphasis placed on mindful visualization and diaphragmatic breathing. Pt stating that the exercise didn't help him. Note that he tried to use the urinal x3  during session, elevated the bed pretty high during each attempt, close supervision for dynamic balance/activity due to pts impulsiveness and pain. Notified NTs and advised supervising him closely when using the urinal. Left pt with lavender aromatherapy to promote calmness, all needs within reach and bed alarm set. Pt informed that he is no longer Mod I in the room and staff also informed.   ADL ADL Grooming: Modified independent Upper Body Bathing: Setup Lower Body Bathing: Setup Upper Body Dressing: Modified independent (Device) Lower Body Dressing: Modified independent Toileting: Modified independent Toilet Transfer: Contact guard (per evaluating PT) Toilet Transfer Method: Nutritional therapist: Not assessed ADL Comments: Using clinical judgement, pt can perform BADLs from bedlevel at Mod I level Mobility   Min A sit<stand   Discharge Criteria: Patient will be discharged from OT if patient refuses treatment 3 consecutive times without medical reason, if treatment goals not met, if there is a change in medical status, if patient makes no progress towards goals or if patient is discharged from hospital.  The above assessment, treatment plan, treatment alternatives and goals were discussed and mutually agreed upon: by patient  Skeet Simmer 04/16/2021, 4:12 PM

## 2021-04-15 NOTE — Progress Notes (Signed)
PROGRESS NOTE   Subjective/Complaints:  Pt reports still cannot take more than 3 steps max- even with some improvement in swelling-  Asking for pain meds- on 10 mg q4 hours Oxy.  MRI done after xray of R foot yesterday.   Shows diffuse myofasciitis and synovial process.    Also legs/feet wrapped with ACE wraps yesterday- and day prior- says it's painful, but wore til 9pm last night.  Didn't get snack last night- d/w nurse- said he didn't have order- showed her order.    ROS:   Pt denies SOB, abd pain, CP, N/V/C/D, and vision changes     Objective:   DG Chest 2 View  Result Date: 04/14/2021 CLINICAL DATA:  Elevated white blood cell count EXAM: CHEST - 2 VIEW COMPARISON:  04/13/2010 FINDINGS: Scarring in the right upper lobe. Left lung clear. Heart is normal size. No effusions or acute bony abnormality. IMPRESSION: No active cardiopulmonary disease. Electronically Signed   By: Rolm Baptise M.D.   On: 04/14/2021 17:15   MR FOOT RIGHT WO CONTRAST  Result Date: 04/15/2021 CLINICAL DATA:  Foot pain and swelling.  Diabetic. EXAM: MRI OF THE RIGHT FOREFOOT WITHOUT CONTRAST TECHNIQUE: Multiplanar, multisequence MR imaging of the right foot was performed. No intravenous contrast was administered. COMPARISON:  Radiographs 04/14/2021 FINDINGS: Bones/Joint/Cartilage Diffuse subcutaneous soft tissue swelling/edema/fluid mainly along the dorsum of the foot. No obvious discrete abscess. There are masslike areas of soft tissue density on the dorsum of the foot. A large masslike area is emanating between the first and second metatarsal bases and protruding into the subcutaneous tissues and the second area is around the dorsum of the fourth metatarsal. These have relatively low T1 and T2 signal intensity and could be a synovial process or fibrous tissue. Gout would certainly be a consideration. Repeat study with contrast may be helpful for further  evaluation. Diffuse edema like signal changes involving the foot musculature suggesting myofasciitis. No obvious findings for pyomyositis. There is a fluid collection surrounding the extensor tendon of the second toe which is likely tenosynovitis but could not exclude septic tenosynovitis. No MR findings suspicious for septic arthritis or osteomyelitis. There are age advanced midfoot degenerative changes with erosive changes. Findings could be due to an erosive arthropathy or early neuropathic disease. IMPRESSION: 1. Diffuse subcutaneous soft tissue swelling/edema/fluid mainly along the dorsum of the foot suggesting cellulitis. No obvious discrete abscess. 2. Diffuse myofasciitis without definite findings for pyomyositis. 3. No MR findings for septic arthritis or osteomyelitis. 4. Age advanced midfoot degenerative changes with erosive changes. Findings could be due to an erosive arthropathy such as gout or early neuropathic disease. 5. Masslike densities on the dorsum of the foot have low T2 signal intensity suggesting synovial process or fibrous tissue. Tophaceous gout would certainly be a consideration. Repeat study with contrast may be helpful. 6. Fluid collection surrounding the extensor tendon of the second toe which is likely tenosynovitis. Electronically Signed   By: Marijo Sanes M.D.   On: 04/15/2021 08:21   DG Foot Complete Left  Result Date: 04/14/2021 CLINICAL DATA:  Elevated white blood cell count. EXAM: LEFT FOOT - COMPLETE 3+ VIEW COMPARISON:  None. FINDINGS: Soft tissue  swelling along the dorsum of the foot. No acute bony abnormality. Specifically, no fracture, subluxation, or dislocation. No bone destruction to suggest osteomyelitis. IMPRESSION: No acute bony abnormality. Electronically Signed   By: Rolm Baptise M.D.   On: 04/14/2021 17:15   DG Foot Complete Right  Result Date: 04/14/2021 CLINICAL DATA:  Elevated white blood cell count EXAM: RIGHT FOOT COMPLETE - 3+ VIEW COMPARISON:  None.  FINDINGS: Soft tissue swelling along the dorsum of the foot. Radiopaque densities are seen in the dorsal soft tissues of unknown etiology, new since prior study. There is cortical lucency noted along the dorsal surface of the medial kidney a form. Cannot exclude osteomyelitis. No fracture, subluxation or dislocation. IMPRESSION: Lucency of along the dorsal surface of the medial cuneiform on the lateral view concerning for osteomyelitis. This could be further evaluated with MRI. Linear radiopaque densities within the dorsal soft tissues of unknown etiology. This could reflect soft tissue calcifications. Marked dorsal soft tissue swelling. Electronically Signed   By: Rolm Baptise M.D.   On: 04/14/2021 17:18   Recent Labs    04/14/21 0541 04/15/21 0541  WBC 16.4* 17.3*  HGB 8.5* 9.0*  HCT 26.9* 28.4*  PLT 194 189     Recent Labs    04/14/21 0541 04/15/21 0541  NA 135 134*  K 4.2 4.2  CL 100 95*  CO2 24 25  GLUCOSE 132* 98  BUN 34* 43*  CREATININE 1.35* 1.74*  CALCIUM 9.1 9.6    Intake/Output Summary (Last 24 hours) at 04/15/2021 1029 Last data filed at 04/15/2021 0740 Gross per 24 hour  Intake 835 ml  Output 3700 ml  Net -2865 ml         Physical Exam: Vital Signs Blood pressure 130/90, pulse (!) 109, temperature 99.5 F (37.5 C), temperature source Oral, resp. rate 20, height '5\' 10"'$  (1.778 m), weight 86.4 kg, SpO2 96 %.      General: awake, alert, appropriate, sitting up at EOB; legs not wrapped yet;  NAD HENT: conjugate gaze; oropharynx moist CV: tachycardic rate; no JVD Pulmonary: CTA B/L; no W/R/R- good air movement GI: soft, NT, ND, (+)BS Psychiatric: appropriate; frustrated with being ill/lab draws;  Neurological: Ox3  Ext: 2-3+ pedal edema B/L- very TTP dorsally;  and 1-2+ edema to knees B/L - no change in legs, but feet better Psych: pleasant and cooperative  Musculoskeletal: TTP over midline low back- not c/w CVA tenderness    Comments: Bilateral feet   with 2+ edema left>right. Ues- deltoids and biceps 4-/5, Triceps 4-/5, WE/grip and finger abd 4+/5 all B/L LE's - HF 2+/5, KE 3-/5, PF 3-/5, DF 2/5 (Pain?) and PF 2/5 (pain?)- all B/L L shoulder- TTP on top/AC joint and RTC/posterior Skin:    General: Skin is warm and dry.    Comments: multiple scars on leg   Neurological:    Mental Status: He is alert and oriented to person, place, and time.    Comments: Appears decreased sensation to light touch in LE's from knees down, but pt wasn't clear  - same as what was on admission- decreased to LT in LE's worse on LLE.    Assessment/Plan: 1. Functional deficits which require 3+ hours per day of interdisciplinary therapy in a comprehensive inpatient rehab setting. Physiatrist is providing close team supervision and 24 hour management of active medical problems listed below. Physiatrist and rehab team continue to assess barriers to discharge/monitor patient progress toward functional and medical goals  Care Tool:  Bathing  Body parts bathed by patient: Right arm, Left arm, Chest, Abdomen, Right upper leg, Left upper leg, Face, Front perineal area, Right lower leg, Left lower leg, Buttocks   Body parts bathed by helper: Buttocks, Right lower leg, Left lower leg     Bathing assist Assist Level: Set up assist (per most recent staff documentation)     Upper Body Dressing/Undressing Upper body dressing   What is the patient wearing?: Pull over shirt    Upper body assist Assist Level: Independent with assistive device (per most recent staff documentation)    Lower Body Dressing/Undressing Lower body dressing      What is the patient wearing?: Underwear/pull up, Pants     Lower body assist Assist for lower body dressing: Independent with assitive device (per most recent staff documentation)     Toileting Toileting    Toileting assist Assist for toileting: Independent with assistive device     Transfers Chair/bed  transfer  Transfers assist     Chair/bed transfer assist level: Independent with assistive device Chair/bed transfer assistive device: Museum/gallery exhibitions officer assist      Assist level: Supervision/Verbal cueing Assistive device: Walker-rolling Max distance: 300 ft   Walk 10 feet activity   Assist     Assist level: Independent with assistive device Assistive device: Walker-rolling   Walk 50 feet activity   Assist Walk 50 feet with 2 turns activity did not occur: Safety/medical concerns  Assist level: Independent with assistive device Assistive device: Walker-rolling    Walk 150 feet activity   Assist Walk 150 feet activity did not occur: Safety/medical concerns  Assist level: Supervision/Verbal cueing Assistive device: Walker-rolling    Walk 10 feet on uneven surface  activity   Assist Walk 10 feet on uneven surfaces activity did not occur: Safety/medical concerns   Assist level: Supervision/Verbal cueing Assistive device: Aeronautical engineer Will patient use wheelchair at discharge?: Yes Type of Wheelchair: Manual Wheelchair activity did not occur: Refused (Pt stated he would not leave his room)  Wheelchair assist level: Independent Max wheelchair distance: 500 ft    Wheelchair 50 feet with 2 turns activity    Assist    Wheelchair 50 feet with 2 turns activity did not occur: Refused   Assist Level: Independent   Wheelchair 150 feet activity     Assist  Wheelchair 150 feet activity did not occur: Refused   Assist Level: Independent   Blood pressure 130/90, pulse (!) 109, temperature 99.5 F (37.5 C), temperature source Oral, resp. rate 20, height '5\' 10"'$  (1.778 m), weight 86.4 kg, SpO2 96 %.  Medical Problem List and Plan: 1.  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 Maybe earlier- supervision to mod I- likely needs Mod I  -Continue CIR therapies including PT, OT, and SLP  8/9- according to therapy, has been discharged from PT and OT- was mod I- will hopefully be ready for d/c in next 1-2 days-   8/11- will restart PT and OT orders/eval and treat- since pt found to actually walk no more than 3-5 steps right now- on Sunday was walking mod I in room.  2.  Impaired mobility, ambulating >200 feet: d/c Lovenox 3. Left shoulder/back/foot  pain/Pain/diabetic peripheral neuropathy: continue Oxycodone prn. Increase steroids for gout flare. Will do 60 mg daily x 2 days, then taper. Pt rates pain as "11/10". Needs to bring in shoes so able to walk better; If steroids PO doesn't work, might need L shoulder injection. Pt hates needles.   7/27- pt reports tylenol helpful- con't regimen  8/1- added Lidoderm patches for Shoulder and back pain. 8pm to 8am  8/6 increase gabapentin to '300mg'$  TID  8/9- pt insistent "oxy doesn't work- but already taking 10 mg q4 hours around the clock- will not increase.  8/10- has had pain chronically- con't regimen 4. Mood: LCSW to follow for evaluation and support.              -antipsychotic agents: N/A 5. Neuropsych: This patient is capable of making decisions on his own behalf. 6. Skin/Wound Care: Routine pressure relief measures.  7. Fluids/Electrolytes/Nutrition: Strict I/O. Check CMET in am 8. PJP PNA/leukocytosis:  Duration of meds discussed with pharmacy who relayed that patient has completed 21+ days of steroids and primaquine. --Clindamycin thorough 07/29 to complete 21 days. 8/10- calling ID due to rising WBC to 16.4- ID consulted- labs in am-xrays and blood Cx's 8/11- has myofasciitis of feet and WBC is rising - is 17.3 now- called ID back for full consult- and to see if need MRI of L foot as well-  9. Gout flare: Was on colchicine 0.6 mg daily PTA. --prednisone taper almost completed 8/9- will give  colchicine dose x2 again to see if will help feet pain.  10. T2DM: Hgb A1c- 5.4. Was on glucotrol/metformin bid PTA.             --continue Lantus while on steroids with SSI for elevated BS.  7/27- BG's 94-159- good considering steroids- con't to monitor  8/6- BG's controlled, in spite of steroids- con't regimen 11. Anxiety d/o: Was on Klonopin TID PTA. --bid effective at this time. 12. HTN: Monitor BP tid--continue hydralazine and Norvasc.  7/27- BP slightly elevated- will monitor for trend.   8/6- BP under fair/borderline control 13. HIV: On Epivir and Tivicay 14. Anemia: H/H steadily improving. Recheck H/H in am.  7/27- Hb 8.9- still above a few days ago- con't to monitor  8/1- Hb back down to 7.9- has gone up and donw 1 unit- if still down next week, might need more intervention? 15. Hypomagnesemia: Was supplemented yesterday. --Recheck in am.  7/27- wasn't done/ordered- so will check in AM- last Mg 1.3- will give Magnesium based on labs in AM.   7/28- will add MgOx 400 mg BID and recheck qMonday.   8/1- pt refuses more labs except for 1x/week so cannot do IV_ will increase Mg, in spite of fact Mg still 1.3- like last week. This is a CHRONIC low Mg level, FYI.   8/8- Mg 1.1- has to go home on PO Mg. Will see if can convince him to get IV Mg.  16. Intermittent Leucocytosis: Monitor for fevers and other signs of infection.   7/27- WBC 10.3- will monitor  8/1- WBC 11.0- but no signs/Sx's of infection- afebrile  8/9- WBC 14.7- U/A and CXR (-)- will recheck in AM- no fever (Tm 99.3- per staff, covered in blankets). Will monitor and recheck labs 17. Dysphagia- improved- now just passed to regular diet with thin liquids as of today. 18. Urinary retention- has failed at least 1 voiding trial on 7/20- on Flomax 0.4 mg nightly- will make sure has been on 7 days before  trying another voiding trial.  7/29- Flomax dropped off when got here, so don't remove foley until at least 8/1- since restarted  7/28   voiding   8/8- is voiding- foley out.  19. Bowel incontinence- will see if can help pt become continent with improved function.   7/27- nursing got up to [facility] last night.  20. Chipped nails- will see if can help?  7/28- says it's better after Olin Hauser helping him.  21. GERD  87/28- will restart Protonix- also has tums prn. 22. Pressure injury to buttocks: offload q12mnutes 23. Bilateral lower extremity swelling: ice, elevated, compression garments 24. Shoulder pain: continue lidocaine patch and voltaren gel.  25. Nearsighted- schedule outpatient ophthalmology eval- should be checked for diabetic retinopathy 26. Diabetic peripheral neuropathy: will benefit from outpatient Qutenza. Ordered diabetic shoes  -gabapentin increased as above  8/9- likely cause of some of pain- doesn't respond to oxy 27. Lower extremity edema:resumed '10mg'$  daily Lasix.  28. AKI:   8/8- Cr 1.38- back and forth- from 1.07 to 1.40- could be Septra??? But will let him f/u with ID/or get PCP.   8/9- a lot of LE edema- s/p Lasix 80 mg IV again- Renal following- appreciate their input. BMP in AM   8/10- edema is improving- waiting for nephrology note 29. Leukocytosis  8/11- don't know why- based on R foot MRI, has myofasciitis of R foot- called Ortho and also called ID due to rising leukocytosis- WBC 17.3 today-   - labs daily for next few days.    I spent a total of 41 minutes on visit- >50% coordination of care- d/w PT/OT to restart; Calling Ortho and ID consults.  Going over MRI and labs myself.   LOS: 16 days A FACE TO FACE EVALUATION WAS PERFORMED  Geanette Buonocore 04/15/2021, 10:29 AM

## 2021-04-15 NOTE — Consult Note (Signed)
[facility]    Date of Admission:  03/30/2021     Reason for Consult: bilateral feet swelling/leukocytosis    Referring Provider: Lovorn    Abx: Clindamycin/primaquine by 7/29 for pjp       Assessment/plan: 45 y.o. male with HIV/AIDS successfully suppressed on Dovato after initial period of imperfect adherence, admitted for sepsis and respiratory failure treated empirically for pjp pna with clindamycin/primaquin in setting aki. Patient also finished tx for VAP (mssa/mrse). Patient improved from respiratory standpoint. ID reconsulted for bilateral feet swelling/pain    #hiv/aids #pjp pna Lab Results  Component Value Date   HIV1RNAQUANT 240 03/16/2021  His cd4 was 35 on 6/13, but increased to 324 on 7/12 He finished his PJP treatment on 7/28 Would continue secondary prophylaxis for 3 months with cd4 >200 being the goal for the 3 months  He is being maintained on Dovato equivalent (tivicay and lamivudine) here   #aki Previously requires dialysis here but recovered  #4 bilateral foot pain/swelling Suspect gout Can check triple screen to r/o disseminated gonorrhea but low suspision Consider IR aspiration subtalar joint to look for joint crystal   Plan: - triple screen (anal, oropharyngeal swab, and urine cytology) for gc/chlamydia - discuss with primary team to consider aspirating subtalar joint for crystal analysis -- Ortho vs IR input - PLEASE ENSURE THAT WHEN HE NEARS DC THAT ID is CONTACTED TO [facility] WITH 30 DAY SUPPLY OF MEDICATIONS. - Continue Dapsone prophylaxis for pjp - continue tivicay/lamivudine (on discharge patient to get 30 day dovato)  - if fever, chill, pustular rash/other joint pain, would add ceftriaxone 1 gram iv daily for empiric disseminated gonococcal infection     - patient has ID clinic f/u with dr Tommy Medal on 8/18 @ 145   I spent 60 minute reviewing data/chart, and coordinating care  and >50% direct face to face time providing counseling/discussing diagnostics/treatment plan with patient   ------------------------------------------------ Principal Problem:   Intensive care (ICU) myopathy Active Problems:   AIDS (acquired immune deficiency syndrome) ([place])   Debility   Gout attack    HPI: Bruce Hunter is a 45 y.o. male now in inpatient rehab after a prolonged course for aids related respiratory failure thought to be pjp/VAP, id reengaged for bilateral leg swelling   I reviewed chart and spoke with patient He finished pjp pna tx on 7/28. Previously was trached. No room air without any respiratory disturbance. He was also treated for VAP (mssa/mrse)  He is currently doing well in terms of virologic/immunologic control on dovato His cd4 had risen to 300s from <35 from mid June to mid juley 2022  He is on dapsone secondary prophylaxis for pjp. Had aki previously needing dialysis here and not given bactrim; pjp tx with primaquine/clindamycin  He also reports hx gout and bilateral foot pain thought to be related to it. The last few days however pain worse with increased swelling. Mri 8/10 right foot show significant soft tissue swelling   Other id testing this admission: 7/8 rpr negative 7/8 blastomyces antigen and urine histo ag negative 7/9 serum crypto ag negative  No gc/chlamydia testing yet  He has echo on 7/1 showing normal biventricular funtion with moderate MR   Patient hasn't had fever but the last few days rising leukocytosis with the worsening bilateral feet swelling/pain. No rash. No other joint pain. No penile discharge   Family History  Problem Relation Age  of Onset   Asthma Mother    Cancer Mother    Diabetes Mother    Obesity Mother    Obesity Sister    Diabetes Brother    Obesity Brother    Cancer Maternal Grandmother    Diabetes Maternal Grandmother    Diabetes Other     Social History   Tobacco Use   Smoking status: Every Day     Packs/day: 0.25    Types: Cigarettes   Smokeless tobacco: Never  Substance Use Topics   Alcohol use: Yes   Drug use: Yes    Frequency: 1.0 times per week    Types: Marijuana    Comment: marijuana at midnight last night; daily    No Known Allergies  Review of Systems: ROS All Other ROS was negative, except mentioned above   Past Medical History:  Diagnosis Date   Asthma    Diabetes mellitus without complication (HCC)    Gout    Hyperlipidemia    Hypertension    Obesity        Scheduled Meds:  (feeding supplement) PROSource Plus  30 mL Oral TID BM   clonazePAM  0.5 mg Oral TID   dapsone  100 mg Oral Daily   diclofenac Sodium  2 g Topical QID   dolutegravir  50 mg Oral Daily   gabapentin  300 mg Oral Q12H   hydrALAZINE  25 mg Oral Q8H   insulin aspart  0-15 Units Subcutaneous TID WC   insulin aspart  0-5 Units Subcutaneous QHS   insulin glargine-yfgn  5 Units Subcutaneous Daily   lamiVUDine  300 mg Oral Daily   lidocaine  1 patch Transdermal Q24H   lidocaine  2 patch Transdermal Q24H   magnesium oxide  400 mg Oral BID   metoprolol tartrate  25 mg Oral BID   multivitamin with minerals  1 tablet Oral Daily   pantoprazole  40 mg Oral Daily   tamsulosin  0.4 mg Oral QPC supper   Continuous Infusions: PRN Meds:.acetaminophen, albuterol, alum & mag hydroxide-simeth, bisacodyl, calcium carbonate, diphenhydrAMINE, guaiFENesin-dextromethorphan, lip balm, oxyCODONE, polyethylene glycol, prochlorperazine **OR** prochlorperazine **OR** prochlorperazine, traZODone   OBJECTIVE: Blood pressure 130/90, pulse (!) 109, temperature 99.5 F (37.5 C), temperature source Oral, resp. rate 20, height '5\' 10"'$  (1.778 m), weight 86.4 kg, SpO2 96 %.  Physical Exam General/constitutional: no distress, pleasant HEENT: Normocephalic, PER, Conj Clear, EOMI, Oropharynx clear Neck supple CV: rrr no mrg Lungs: clear to auscultation, normal respiratory effort Abd: Soft, Nontender Ext:  no edema Skin: No Rash Neuro: nonfocal MSK: bilateral feet swelling/tenderness; limited rom of bilateral sub-talar joint Psych: alert/oriented  Central line presence: no    Lab Results Lab Results  Component Value Date   WBC 17.3 (H) 04/15/2021   HGB 9.0 (L) 04/15/2021   HCT 28.4 (L) 04/15/2021   MCV 94.7 04/15/2021   PLT 189 04/15/2021    Lab Results  Component Value Date   CREATININE 1.74 (H) 04/15/2021   BUN 43 (H) 04/15/2021   NA 134 (L) 04/15/2021   K 4.2 04/15/2021   CL 95 (L) 04/15/2021   CO2 25 04/15/2021    Lab Results  Component Value Date   ALT 9 04/01/2021   AST 17 04/01/2021   ALKPHOS 64 04/01/2021   BILITOT 0.2 (L) 04/01/2021      Microbiology: Recent Results (from the past 240 hour(s))  Urine Culture     Status: Abnormal   Collection Time: 04/13/21  8:05 AM  Specimen: Urine, Clean Catch  Result Value Ref Range Status   Specimen Description URINE, CLEAN CATCH  Final   Special Requests NONE  Final   Culture (A)  Final    <10,000 COLONIES/mL INSIGNIFICANT GROWTH Performed at [facility], [address], [place], [place] [address]    Report Status 04/14/2021 FINAL  Final  Culture, blood (routine x 2)     Status: None (Preliminary result)   Collection Time: 04/14/21 11:19 AM   Specimen: BLOOD  Result Value Ref Range Status   Specimen Description BLOOD RIGHT ANTECUBITAL  Final   Special Requests   Final    BOTTLES DRAWN AEROBIC AND ANAEROBIC Blood Culture adequate volume   Culture   Final    NO GROWTH < 24 HOURS Performed at [facility], [place] [address], [place], [place] [address]    Report Status PENDING  Incomplete  Culture, blood (routine x 2)     Status: None (Preliminary result)   Collection Time: 04/14/21 11:36 AM   Specimen: BLOOD LEFT FOREARM  Result Value Ref Range Status   Specimen Description BLOOD LEFT FOREARM  Final   Special Requests   Final    BOTTLES DRAWN AEROBIC AND ANAEROBIC Blood Culture adequate volume    Culture   Final    NO GROWTH < 24 HOURS Performed at [facility], [place] [address] [place], [place] [address]    Report Status PENDING  Incomplete     Serology:    Imaging: If present, new imagings (plain films, ct scans, and mri) have been personally visualized and interpreted; radiology reports have been reviewed. Decision making incorporated into the Impression / Recommendations.   8/10 mri right foot 1. Diffuse subcutaneous soft tissue swelling/edema/fluid mainly along the dorsum of the foot suggesting cellulitis. No obvious discrete abscess. 2. Diffuse myofasciitis without definite findings for pyomyositis. 3. No MR findings for septic arthritis or osteomyelitis. 4. Age advanced midfoot degenerative changes with erosive changes. Findings could be due to an erosive arthropathy such as gout or early neuropathic disease. 5. Masslike densities on the dorsum of the foot have low T2 signal intensity suggesting synovial process or fibrous tissue. Tophaceous gout would certainly be a consideration. Repeat study with contrast may be helpful. 6. Fluid collection surrounding the extensor tendon of the second toe which is likely tenosynovitis.   7/01 tte  1. Left ventricular ejection fraction, by estimation, is 60 to 65%. The  left ventricle has normal function. The left ventricle has no regional  wall motion abnormalities. Left ventricular diastolic parameters were  normal.   2. Right ventricular systolic function is low normal. The right  ventricular size is normal. There is mildly elevated pulmonary artery  systolic pressure.   3. Right atrial size was mildly dilated.   4. The mitral valve is normal in structure. No evidence of mitral valve  regurgitation.   5. Tricuspid valve regurgitation is mild to moderate.   6. The aortic valve is normal in structure. Aortic valve regurgitation is  not visualized.    Jabier Mutton, [facility] [place] [phone] pager    04/15/2021, 1:55 PM

## 2021-04-15 NOTE — Progress Notes (Signed)
Patient ID: Bruce Hunter, male   DOB: May 01, 1976, 45 y.o.   MRN: [id number] pt feels now needs to go to OPPT due to issues with his feet are works and he is not moving as well since Monday. Discussed which OP is closer to him and he reports [facility] Have fax referral and they will contact pt to set up appointment. Gave him [employer] information to contact regarding ride. He reports he has two steps into home and he can not do this now. He plans on going to Mom's for a few days. Discussed if brother could bmp him up the stairs and he reports no, he has his own issues. He asked if he could go somewhere he could stay for awhile and informed him that would be a NH and he declined this option. See if medically stable for discharge today

## 2021-04-15 NOTE — Progress Notes (Signed)
Nephrology Progress Note:   Subjective: He had 3.7 liters UOP over 8/10.  He was seen by ortho due to foot pain now limiting ambulation and they are concerned about gout.   Review of systems:     Denies shortness of breath  Decreased mobility Denies n/v Denies cp   Objective Vital signs in last 24 hours: Vitals:   04/14/21 2003 04/15/21 0500 04/15/21 0622 04/15/21 0647  BP: 122/77  130/90   Pulse: 89   (!) 109  Resp: 18  20   Temp: 99 F (37.2 C)  99.9 F (37.7 C) 99.5 F (37.5 C)  TempSrc:   Oral Oral  SpO2: 97%  100% 96%  Weight:  86.4 kg    Height:       Weight change:   Intake/Output Summary (Last 24 hours) at 04/15/2021 1345 Last data filed at 04/15/2021 1305 Gross per 24 hour  Intake 838 ml  Output 2350 ml  Net -1512 ml    Assessment/ Plan: Pt is a 45 y.o. yo male with HIV/AIDS, DM, baseline CKD who was admitted on 03/30/2021 with acute hypoxic resp failure with sepsis and AKI.  New diagnosis HIV and with PJP PNA during acute hospitalization now s/p course of abx for same   Assessment/Plan: 1. Hx AKI  AKI in the setting of hypoxic resp failure, ATN with septic shock and also urinary retention.  Off and on CRRT-  most recently stopped on 7/15 and temp HD catheter out.  Note 02/2020 Cr 2.80 and 05/2011 Cr 0.90 - only prior labs in [facility] system.  reported baseline crt in the high 2's however this was based on limited data and may have just been reflective of prior AKI episode.   - Reassess lasix tomorrow  - strict ins/outs ordered - daily weights ordered  2. Chronic hypoxic resp failure s/p trach - now decannulated  3. HIV - regimen per primary team/ID  4. HTN - above goal and with overload; started metoprolol. Stopped amlodipine in the event is contributing to swelling  5. Anemia normocytic - no acute need for PRBC's or ESA.  Iron deficiency. IV iron x 2 doses ordered  6. Impaired mobility - per CIR team  7.  Urinary retention-  continue flomax and note foley is  removed  8. Hypomagnesemia  - repleted   Labs: Basic Metabolic Panel: Recent Labs  Lab 04/13/21 0502 04/14/21 0541 04/15/21 0541  NA 134* 135 134*  K 4.0 4.2 4.2  CL 100 100 95*  CO2 '25 24 25  '$ GLUCOSE 124* 132* 98  BUN 36* 34* 43*  CREATININE 1.44* 1.35* 1.74*  CALCIUM 8.9 9.1 9.6   Liver Function Tests: Recent Labs  Lab 04/12/21 0644  ALBUMIN 2.8*    No results for input(s): LIPASE, AMYLASE in the last 168 hours. No results for input(s): AMMONIA in the last 168 hours. CBC: Recent Labs  Lab 04/13/21 0502 04/14/21 0541 04/15/21 0541  WBC 14.7* 16.4* 17.3*  NEUTROABS  --  12.7* 13.6*  HGB 7.9* 8.5* 9.0*  HCT 25.4* 26.9* 28.4*  MCV 95.8 95.7 94.7  PLT 191 194 189    Cardiac Enzymes: No results for input(s): CKTOTAL, CKMB, CKMBINDEX, TROPONINI in the last 168 hours. CBG: Recent Labs  Lab 04/14/21 1148 04/14/21 1704 04/14/21 2108 04/15/21 0618 04/15/21 1157  GLUCAP 135* 115* 116* 134* 162*    Iron Studies:  Recent Labs    04/12/21 1528  IRON 18*  TIBC 270  FERRITIN 866*  Studies/Results: DG Chest 2 View  Result Date: 04/14/2021 CLINICAL DATA:  Elevated white blood cell count EXAM: CHEST - 2 VIEW COMPARISON:  04/13/2010 FINDINGS: Scarring in the right upper lobe. Left lung clear. Heart is normal size. No effusions or acute bony abnormality. IMPRESSION: No active cardiopulmonary disease. Electronically Signed   By: Rolm Baptise M.D.   On: 04/14/2021 17:15   MR FOOT RIGHT WO CONTRAST  Result Date: 04/15/2021 CLINICAL DATA:  Foot pain and swelling.  Diabetic. EXAM: MRI OF THE RIGHT FOREFOOT WITHOUT CONTRAST TECHNIQUE: Multiplanar, multisequence MR imaging of the right foot was performed. No intravenous contrast was administered. COMPARISON:  Radiographs 04/14/2021 FINDINGS: Bones/Joint/Cartilage Diffuse subcutaneous soft tissue swelling/edema/fluid mainly along the dorsum of the foot. No obvious discrete abscess. There are masslike areas of soft  tissue density on the dorsum of the foot. A large masslike area is emanating between the first and second metatarsal bases and protruding into the subcutaneous tissues and the second area is around the dorsum of the fourth metatarsal. These have relatively low T1 and T2 signal intensity and could be a synovial process or fibrous tissue. Gout would certainly be a consideration. Repeat study with contrast may be helpful for further evaluation. Diffuse edema like signal changes involving the foot musculature suggesting myofasciitis. No obvious findings for pyomyositis. There is a fluid collection surrounding the extensor tendon of the second toe which is likely tenosynovitis but could not exclude septic tenosynovitis. No MR findings suspicious for septic arthritis or osteomyelitis. There are age advanced midfoot degenerative changes with erosive changes. Findings could be due to an erosive arthropathy or early neuropathic disease. IMPRESSION: 1. Diffuse subcutaneous soft tissue swelling/edema/fluid mainly along the dorsum of the foot suggesting cellulitis. No obvious discrete abscess. 2. Diffuse myofasciitis without definite findings for pyomyositis. 3. No MR findings for septic arthritis or osteomyelitis. 4. Age advanced midfoot degenerative changes with erosive changes. Findings could be due to an erosive arthropathy such as gout or early neuropathic disease. 5. Masslike densities on the dorsum of the foot have low T2 signal intensity suggesting synovial process or fibrous tissue. Tophaceous gout would certainly be a consideration. Repeat study with contrast may be helpful. 6. Fluid collection surrounding the extensor tendon of the second toe which is likely tenosynovitis. Electronically Signed   By: Marijo Sanes M.D.   On: 04/15/2021 08:21   DG Foot Complete Left  Result Date: 04/14/2021 CLINICAL DATA:  Elevated white blood cell count. EXAM: LEFT FOOT - COMPLETE 3+ VIEW COMPARISON:  None. FINDINGS: Soft tissue  swelling along the dorsum of the foot. No acute bony abnormality. Specifically, no fracture, subluxation, or dislocation. No bone destruction to suggest osteomyelitis. IMPRESSION: No acute bony abnormality. Electronically Signed   By: Rolm Baptise M.D.   On: 04/14/2021 17:15   DG Foot Complete Right  Result Date: 04/14/2021 CLINICAL DATA:  Elevated white blood cell count EXAM: RIGHT FOOT COMPLETE - 3+ VIEW COMPARISON:  None. FINDINGS: Soft tissue swelling along the dorsum of the foot. Radiopaque densities are seen in the dorsal soft tissues of unknown etiology, new since prior study. There is cortical lucency noted along the dorsal surface of the medial kidney a form. Cannot exclude osteomyelitis. No fracture, subluxation or dislocation. IMPRESSION: Lucency of along the dorsal surface of the medial cuneiform on the lateral view concerning for osteomyelitis. This could be further evaluated with MRI. Linear radiopaque densities within the dorsal soft tissues of unknown etiology. This could reflect soft tissue calcifications. Marked dorsal soft  tissue swelling. Electronically Signed   By: Rolm Baptise M.D.   On: 04/14/2021 17:18   Medications: Infusions:    Scheduled Medications:  (feeding supplement) PROSource Plus  30 mL Oral TID BM   clonazePAM  0.5 mg Oral TID   dapsone  100 mg Oral Daily   diclofenac Sodium  2 g Topical QID   dolutegravir  50 mg Oral Daily   gabapentin  300 mg Oral Q12H   hydrALAZINE  25 mg Oral Q8H   insulin aspart  0-15 Units Subcutaneous TID WC   insulin aspart  0-5 Units Subcutaneous QHS   insulin glargine-yfgn  5 Units Subcutaneous Daily   lamiVUDine  300 mg Oral Daily   lidocaine  1 patch Transdermal Q24H   lidocaine  2 patch Transdermal Q24H   magnesium oxide  400 mg Oral BID   metoprolol tartrate  25 mg Oral BID   multivitamin with minerals  1 tablet Oral Daily   pantoprazole  40 mg Oral Daily   tamsulosin  0.4 mg Oral QPC supper    have reviewed scheduled and  prn medications.  Physical Exam:  General adult male in bed in no acute distress HEENT normocephalic atraumatic extraocular movements intact sclera anicteric Neck supple trachea midline; bandage over old trach site Lungs clear to auscultation bilaterally normal work of breathing at rest on room air Heart S1S2 no rub Abdomen soft nontender nondistended Extremities 1+ edema; tender; legs are wrapped Psych normal mood and affect Neuro - awake and conversant; follows commands and provides hx   Claudia Desanctis, MD 04/15/2021,2:04 PM

## 2021-04-15 NOTE — Consult Note (Signed)
Reason for Consult:Foot mass Referring Physician: Courtney Heys Time called: [id number] Time at bedside: [facility] [place] is an 45 y.o. male.  HPI: [place] is in CIR 2/2 debilitation from a bout of PNA. Since Monday he has experienced bilateral foot pain and swelling. A MRI of the right foot was obtained that showed some masses and orthopedic surgery was consulted. He admits to foot pain from gout in the past but this seems different, more diffuse. He denies fevers, chills, sweats, N/V.  Past Medical History:  Diagnosis Date   Asthma    Diabetes mellitus without complication ([place])    Gout    Hyperlipidemia    Hypertension    Obesity     Past Surgical History:  Procedure Laterality Date   BIOPSY  02/15/2021   Procedure: BIOPSY;  Surgeon: Ronnette Juniper, MD;  Location: [facility];  Service: Gastroenterology;;   COLONOSCOPY WITH PROPOFOL N/A 02/15/2021   Procedure: COLONOSCOPY WITH PROPOFOL;  Surgeon: Ronnette Juniper, MD;  Location: [facility];  Service: Gastroenterology;  Laterality: N/A;   ESOPHAGOGASTRODUODENOSCOPY (EGD) WITH PROPOFOL N/A 02/15/2021   Procedure: ESOPHAGOGASTRODUODENOSCOPY (EGD) WITH PROPOFOL;  Surgeon: Ronnette Juniper, MD;  Location: [facility];  Service: Gastroenterology;  Laterality: N/A;   GANGLION CYST EXCISION Right 02/26/2020   Procedure: EXCISION TOPHUS RIGHT FOOT;  Surgeon: Evelina Bucy, DPM;  Location: [facility];  Service: Podiatry;  Laterality: Right;   NO PAST SURGERIES      Family History  Problem Relation Age of Onset   Asthma Mother    Cancer Mother    Diabetes Mother    Obesity Mother    Obesity Sister    Diabetes Brother    Obesity Brother    Cancer Maternal Grandmother    Diabetes Maternal Grandmother    Diabetes Other     Social History:  reports that he has been smoking cigarettes. He has been smoking an average of .25 packs per day. He has never used smokeless tobacco. He reports current alcohol use. He reports current drug  use. Frequency: 1.00 time per week. Drug: Marijuana.  Allergies: No Known Allergies  Medications: I have reviewed the patient's current medications.  Results for orders placed or performed during the hospital encounter of 03/30/21 (from the past 48 hour(s))  Glucose, capillary     Status: Abnormal   Collection Time: 04/13/21  4:36 PM  Result Value Ref Range   Glucose-Capillary 150 (H) 70 - 99 mg/dL    Comment: Glucose reference range applies only to samples taken after fasting for at least 8 hours.   Comment 1 Notify RN   Glucose, capillary     Status: Abnormal   Collection Time: 04/13/21  9:20 PM  Result Value Ref Range   Glucose-Capillary 109 (H) 70 - 99 mg/dL    Comment: Glucose reference range applies only to samples taken after fasting for at least 8 hours.  Basic metabolic panel     Status: Abnormal   Collection Time: 04/14/21  5:41 AM  Result Value Ref Range   Sodium 135 135 - 145 mmol/L   Potassium 4.2 3.5 - 5.1 mmol/L   Chloride 100 98 - 111 mmol/L   CO2 24 22 - 32 mmol/L   Glucose, Bld 132 (H) 70 - 99 mg/dL    Comment: Glucose reference range applies only to samples taken after fasting for at least 8 hours.   BUN 34 (H) 6 - 20 mg/dL   Creatinine, Ser 1.35 (H) 0.61 -  1.24 mg/dL   Calcium 9.1 8.9 - 10.3 mg/dL   GFR, Estimated >60 >60 mL/min    Comment: (NOTE) Calculated using the CKD-EPI Creatinine Equation (2021)    Anion gap 11 5 - 15    Comment: Performed at [place], [place] [address], [place], [place] [address]  CBC with Differential/Platelet     Status: Abnormal   Collection Time: 04/14/21  5:41 AM  Result Value Ref Range   WBC 16.4 (H) 4.0 - 10.5 K/uL   RBC 2.81 (L) 4.22 - 5.81 MIL/uL   Hemoglobin 8.5 (L) 13.0 - 17.0 g/dL   HCT 26.9 (L) 39.0 - 52.0 %   MCV 95.7 80.0 - 100.0 fL   MCH 30.2 26.0 - 34.0 pg   MCHC 31.6 30.0 - 36.0 g/dL   RDW 20.6 (H) 11.5 - 15.5 %   Platelets 194 150 - 400 K/uL   nRBC 0.0 0.0 - 0.2 %   Neutrophils Relative % 77 %    Neutro Abs 12.7 (H) 1.7 - 7.7 K/uL   Lymphocytes Relative 8 %   Lymphs Abs 1.3 0.7 - 4.0 K/uL   Monocytes Relative 12 %   Monocytes Absolute 1.9 (H) 0.1 - 1.0 K/uL   Eosinophils Relative 2 %   Eosinophils Absolute 0.3 0.0 - 0.5 K/uL   Basophils Relative 0 %   Basophils Absolute 0.0 0.0 - 0.1 K/uL   Immature Granulocytes 1 %   Abs Immature Granulocytes 0.18 (H) 0.00 - 0.07 K/uL    Comment: Performed at [facility] [address] [place], [place] [address]  Glucose, capillary     Status: Abnormal   Collection Time: 04/14/21  6:35 AM  Result Value Ref Range   Glucose-Capillary 108 (H) 70 - 99 mg/dL    Comment: Glucose reference range applies only to samples taken after fasting for at least 8 hours.  Culture, blood (routine x 2)     Status: None (Preliminary result)   Collection Time: 04/14/21 11:19 AM   Specimen: BLOOD  Result Value Ref Range   Specimen Description BLOOD RIGHT ANTECUBITAL    Special Requests      BOTTLES DRAWN AEROBIC AND ANAEROBIC Blood Culture adequate volume   Culture      NO GROWTH < 24 HOURS Performed at [facility], [place] [address] [place], [place] [address]    Report Status PENDING   Culture, blood (routine x 2)     Status: None (Preliminary result)   Collection Time: 04/14/21 11:36 AM   Specimen: BLOOD LEFT FOREARM  Result Value Ref Range   Specimen Description BLOOD LEFT FOREARM    Special Requests      BOTTLES DRAWN AEROBIC AND ANAEROBIC Blood Culture adequate volume   Culture      NO GROWTH < 24 HOURS Performed at [place], [facility] [address] [place], [place] [address]    Report Status PENDING   Glucose, capillary     Status: Abnormal   Collection Time: 04/14/21 11:48 AM  Result Value Ref Range   Glucose-Capillary 135 (H) 70 - 99 mg/dL    Comment: Glucose reference range applies only to samples taken after fasting for at least 8 hours.  Glucose, capillary     Status: Abnormal   Collection Time: 04/14/21  5:04 PM  Result  Value Ref Range   Glucose-Capillary 115 (H) 70 - 99 mg/dL    Comment: Glucose reference range applies only to samples taken after  fasting for at least 8 hours.  Glucose, capillary     Status: Abnormal   Collection Time: 04/14/21  9:08 PM  Result Value Ref Range   Glucose-Capillary 116 (H) 70 - 99 mg/dL    Comment: Glucose reference range applies only to samples taken after fasting for at least 8 hours.  Basic metabolic panel     Status: Abnormal   Collection Time: 04/15/21  5:41 AM  Result Value Ref Range   Sodium 134 (L) 135 - 145 mmol/L   Potassium 4.2 3.5 - 5.1 mmol/L   Chloride 95 (L) 98 - 111 mmol/L   CO2 25 22 - 32 mmol/L   Glucose, Bld 98 70 - 99 mg/dL    Comment: Glucose reference range applies only to samples taken after fasting for at least 8 hours.   BUN 43 (H) 6 - 20 mg/dL   Creatinine, Ser 1.74 (H) 0.61 - 1.24 mg/dL   Calcium 9.6 8.9 - 10.3 mg/dL   GFR, Estimated 49 (L) >60 mL/min    Comment: (NOTE) Calculated using the CKD-EPI Creatinine Equation (2021)    Anion gap 14 5 - 15    Comment: Performed at [facility] [address], [place], [place] [address]  CBC with Differential/Platelet     Status: Abnormal   Collection Time: 04/15/21  5:41 AM  Result Value Ref Range   WBC 17.3 (H) 4.0 - 10.5 K/uL   RBC 3.00 (L) 4.22 - 5.81 MIL/uL   Hemoglobin 9.0 (L) 13.0 - 17.0 g/dL   HCT 28.4 (L) 39.0 - 52.0 %   MCV 94.7 80.0 - 100.0 fL   MCH 30.0 26.0 - 34.0 pg   MCHC 31.7 30.0 - 36.0 g/dL   RDW 20.2 (H) 11.5 - 15.5 %   Platelets 189 150 - 400 K/uL   nRBC 0.0 0.0 - 0.2 %   Neutrophils Relative % 78 %   Neutro Abs 13.6 (H) 1.7 - 7.7 K/uL   Lymphocytes Relative 7 %   Lymphs Abs 1.2 0.7 - 4.0 K/uL   Monocytes Relative 12 %   Monocytes Absolute 2.0 (H) 0.1 - 1.0 K/uL   Eosinophils Relative 2 %   Eosinophils Absolute 0.3 0.0 - 0.5 K/uL   Basophils Relative 0 %   Basophils Absolute 0.0 0.0 - 0.1 K/uL   Immature Granulocytes 1 %   Abs Immature Granulocytes 0.18 (H)  0.00 - 0.07 K/uL    Comment: Performed at [facility] [address], [place], [place] [address]  Glucose, capillary     Status: Abnormal   Collection Time: 04/15/21  6:18 AM  Result Value Ref Range   Glucose-Capillary 134 (H) 70 - 99 mg/dL    Comment: Glucose reference range applies only to samples taken after fasting for at least 8 hours.  Glucose, capillary     Status: Abnormal   Collection Time: 04/15/21 11:57 AM  Result Value Ref Range   Glucose-Capillary 162 (H) 70 - 99 mg/dL    Comment: Glucose reference range applies only to samples taken after fasting for at least 8 hours.    DG Chest 2 View  Result Date: 04/14/2021 CLINICAL DATA:  Elevated white blood cell count EXAM: CHEST - 2 VIEW COMPARISON:  04/13/2010 FINDINGS: Scarring in the right upper lobe. Left lung clear. Heart is normal size. No effusions or acute bony abnormality. IMPRESSION: No active cardiopulmonary disease. Electronically Signed   By: Rolm Baptise M.D.   On: 04/14/2021 17:15  MR FOOT RIGHT WO CONTRAST  Result Date: 04/15/2021 CLINICAL DATA:  Foot pain and swelling.  Diabetic. EXAM: MRI OF THE RIGHT FOREFOOT WITHOUT CONTRAST TECHNIQUE: Multiplanar, multisequence MR imaging of the right foot was performed. No intravenous contrast was administered. COMPARISON:  Radiographs 04/14/2021 FINDINGS: Bones/Joint/Cartilage Diffuse subcutaneous soft tissue swelling/edema/fluid mainly along the dorsum of the foot. No obvious discrete abscess. There are masslike areas of soft tissue density on the dorsum of the foot. A large masslike area is emanating between the first and second metatarsal bases and protruding into the subcutaneous tissues and the second area is around the dorsum of the fourth metatarsal. These have relatively low T1 and T2 signal intensity and could be a synovial process or fibrous tissue. Gout would certainly be a consideration. Repeat study with contrast may be helpful for further evaluation. Diffuse  edema like signal changes involving the foot musculature suggesting myofasciitis. No obvious findings for pyomyositis. There is a fluid collection surrounding the extensor tendon of the second toe which is likely tenosynovitis but could not exclude septic tenosynovitis. No MR findings suspicious for septic arthritis or osteomyelitis. There are age advanced midfoot degenerative changes with erosive changes. Findings could be due to an erosive arthropathy or early neuropathic disease. IMPRESSION: 1. Diffuse subcutaneous soft tissue swelling/edema/fluid mainly along the dorsum of the foot suggesting cellulitis. No obvious discrete abscess. 2. Diffuse myofasciitis without definite findings for pyomyositis. 3. No MR findings for septic arthritis or osteomyelitis. 4. Age advanced midfoot degenerative changes with erosive changes. Findings could be due to an erosive arthropathy such as gout or early neuropathic disease. 5. Masslike densities on the dorsum of the foot have low T2 signal intensity suggesting synovial process or fibrous tissue. Tophaceous gout would certainly be a consideration. Repeat study with contrast may be helpful. 6. Fluid collection surrounding the extensor tendon of the second toe which is likely tenosynovitis. Electronically Signed   By: Marijo Sanes M.D.   On: 04/15/2021 08:21   DG Foot Complete Left  Result Date: 04/14/2021 CLINICAL DATA:  Elevated white blood cell count. EXAM: LEFT FOOT - COMPLETE 3+ VIEW COMPARISON:  None. FINDINGS: Soft tissue swelling along the dorsum of the foot. No acute bony abnormality. Specifically, no fracture, subluxation, or dislocation. No bone destruction to suggest osteomyelitis. IMPRESSION: No acute bony abnormality. Electronically Signed   By: Rolm Baptise M.D.   On: 04/14/2021 17:15   DG Foot Complete Right  Result Date: 04/14/2021 CLINICAL DATA:  Elevated white blood cell count EXAM: RIGHT FOOT COMPLETE - 3+ VIEW COMPARISON:  None. FINDINGS: Soft tissue  swelling along the dorsum of the foot. Radiopaque densities are seen in the dorsal soft tissues of unknown etiology, new since prior study. There is cortical lucency noted along the dorsal surface of the medial kidney a form. Cannot exclude osteomyelitis. No fracture, subluxation or dislocation. IMPRESSION: Lucency of along the dorsal surface of the medial cuneiform on the lateral view concerning for osteomyelitis. This could be further evaluated with MRI. Linear radiopaque densities within the dorsal soft tissues of unknown etiology. This could reflect soft tissue calcifications. Marked dorsal soft tissue swelling. Electronically Signed   By: Rolm Baptise M.D.   On: 04/14/2021 17:18    Review of Systems  HENT:  Negative for ear discharge, ear pain, hearing loss and tinnitus.   Eyes:  Negative for photophobia and pain.  Respiratory:  Negative for cough and shortness of breath.   Cardiovascular:  Negative for chest pain.  Gastrointestinal:  Negative  for abdominal pain, nausea and vomiting.  Genitourinary:  Negative for dysuria, flank pain, frequency and urgency.  Musculoskeletal:  Positive for arthralgias (Bilateral feet). Negative for back pain, myalgias and neck pain.  Neurological:  Negative for dizziness and headaches.  Hematological:  Does not bruise/bleed easily.  Psychiatric/Behavioral:  The patient is not nervous/anxious.   Blood pressure 130/90, pulse (!) 109, temperature 99.5 F (37.5 C), temperature source Oral, resp. rate 20, height '5\' 10"'$  (1.778 m), weight 86.4 kg, SpO2 96 %. Physical Exam Constitutional:      General: He is not in acute distress.    Appearance: He is well-developed. He is not diaphoretic.  HENT:     Head: Normocephalic and atraumatic.  Eyes:     General: No scleral icterus.       Right eye: No discharge.        Left eye: No discharge.     Conjunctiva/sclera: Conjunctivae normal.  Cardiovascular:     Rate and Rhythm: Normal rate and regular rhythm.  Pulmonary:      Effort: Pulmonary effort is normal. No respiratory distress.  Musculoskeletal:     Cervical back: Normal range of motion.     Comments: LLE No traumatic wounds, ecchymosis, or rash  Mod TTP bilateral feet, diffuse  No knee or ankle effusion  Knee stable to varus/ valgus and anterior/posterior stress  Sens DPN, SPN, TN intact  Motor EHL, ext, flex, evers 5/5  DP 2+, PT 2+,2+ edema right, 1+ edema left  Skin:    General: Skin is warm and dry.  Neurological:     Mental Status: He is alert.  Psychiatric:        Mood and Affect: Mood normal.        Behavior: Behavior normal.    Assessment/Plan: Bilateral foot pain -- Suspect gout as the etiology. 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 MRI w/wo would be helpful but given renal fxn not worth the risk at this point. Would continue treating gout medically. No role for injection given diffuse nature of involvement.    Lisette Abu, PA-C Orthopedic Surgery [phone] 04/15/2021, 12:09 PM

## 2021-04-16 ENCOUNTER — Other Ambulatory Visit (HOSPITAL_COMMUNITY): Payer: Medicaid Other

## 2021-04-16 ENCOUNTER — Inpatient Hospital Stay (HOSPITAL_COMMUNITY): Payer: Medicaid Other

## 2021-04-16 LAB — CBC WITH DIFFERENTIAL/PLATELET
Abs Immature Granulocytes: 0.09 10*3/uL — ABNORMAL HIGH (ref 0.00–0.07)
Basophils Absolute: 0 10*3/uL (ref 0.0–0.1)
Basophils Relative: 0 %
Eosinophils Absolute: 0.2 10*3/uL (ref 0.0–0.5)
Eosinophils Relative: 2 %
HCT: 27.4 % — ABNORMAL LOW (ref 39.0–52.0)
Hemoglobin: 8.5 g/dL — ABNORMAL LOW (ref 13.0–17.0)
Immature Granulocytes: 1 %
Lymphocytes Relative: 9 %
Lymphs Abs: 1.2 10*3/uL (ref 0.7–4.0)
MCH: 29.7 pg (ref 26.0–34.0)
MCHC: 31 g/dL (ref 30.0–36.0)
MCV: 95.8 fL (ref 80.0–100.0)
Monocytes Absolute: 1.7 10*3/uL — ABNORMAL HIGH (ref 0.1–1.0)
Monocytes Relative: 13 %
Neutro Abs: 10.1 10*3/uL — ABNORMAL HIGH (ref 1.7–7.7)
Neutrophils Relative %: 75 %
Platelets: 205 10*3/uL (ref 150–400)
RBC: 2.86 MIL/uL — ABNORMAL LOW (ref 4.22–5.81)
RDW: 19.8 % — ABNORMAL HIGH (ref 11.5–15.5)
WBC: 13.3 10*3/uL — ABNORMAL HIGH (ref 4.0–10.5)
nRBC: 0 % (ref 0.0–0.2)

## 2021-04-16 LAB — BASIC METABOLIC PANEL
Anion gap: 12 (ref 5–15)
BUN: 45 mg/dL — ABNORMAL HIGH (ref 6–20)
CO2: 22 mmol/L (ref 22–32)
Calcium: 9.4 mg/dL (ref 8.9–10.3)
Chloride: 99 mmol/L (ref 98–111)
Creatinine, Ser: 1.63 mg/dL — ABNORMAL HIGH (ref 0.61–1.24)
GFR, Estimated: 53 mL/min — ABNORMAL LOW (ref >60)
Glucose, Bld: 116 mg/dL — ABNORMAL HIGH (ref 70–99)
Potassium: 4.3 mmol/L (ref 3.5–5.1)
Sodium: 133 mmol/L — ABNORMAL LOW (ref 135–145)

## 2021-04-16 LAB — CYTOLOGY, (ORAL, ANAL, URETHRAL) ANCILLARY ONLY
Chlamydia: NEGATIVE
Chlamydia: NEGATIVE
Comment: NEGATIVE
Comment: NORMAL
Comment: NEGATIVE
Comment: NORMAL
Neisseria Gonorrhea: NEGATIVE
Neisseria Gonorrhea: NEGATIVE

## 2021-04-16 LAB — ACID FAST CULTURE WITH REFLEXED SENSITIVITIES (MYCOBACTERIA): Acid Fast Culture: NEGATIVE

## 2021-04-16 LAB — GLUCOSE, CAPILLARY
Glucose-Capillary: 106 mg/dL — ABNORMAL HIGH (ref 70–99)
Glucose-Capillary: 100 mg/dL — ABNORMAL HIGH (ref 70–99)
Glucose-Capillary: 99 mg/dL (ref 70–99)
Glucose-Capillary: 101 mg/dL — ABNORMAL HIGH (ref 70–99)

## 2021-04-16 IMAGING — US US EXTREM LOW*R* LIMITED
1 series · 14 of 25 positions shown · non-contrast
Comparison: MRI [DATE]

CLINICAL DATA: Edema and fluid

EXAM:
ULTRASOUND right LOWER EXTREMITY LIMITED
TECHNIQUE: Ultrasound examination of the lower extremity soft tissues was
performed in the area of clinical concern.

[Series 1: us soft tissue lower extremity limited right (non- · 39 acquisitions, 14 frames shown]
[im 1/39]
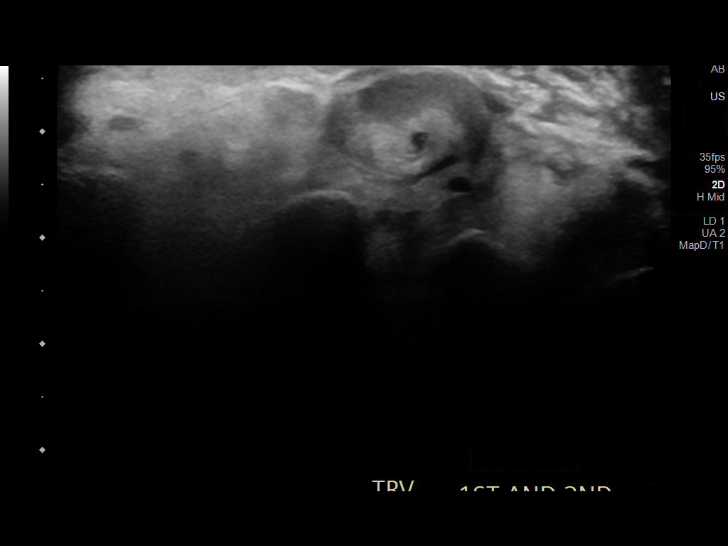
[im 4/39]
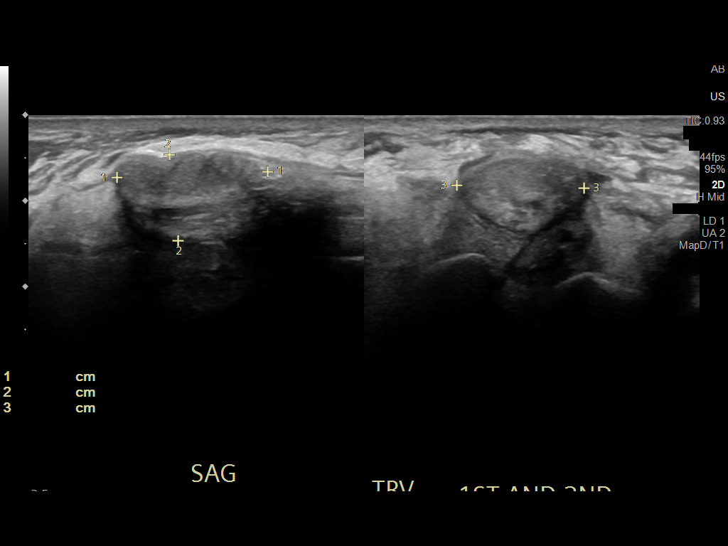
[im 7/39]
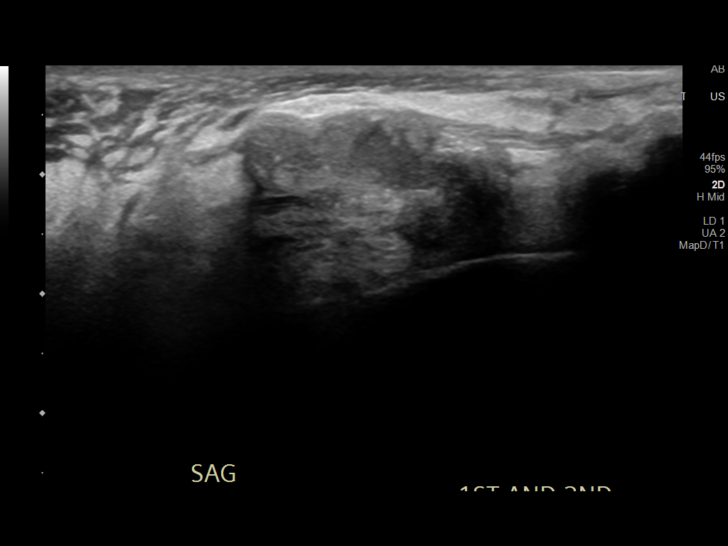
[im 10/39]
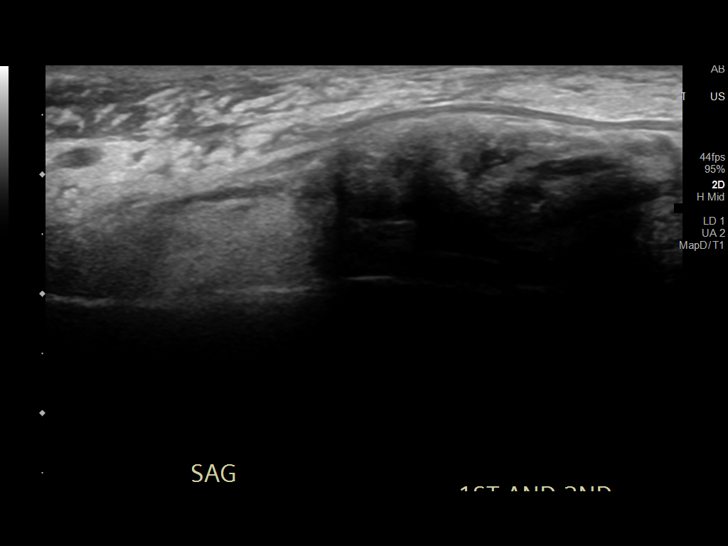
[im 13/39]
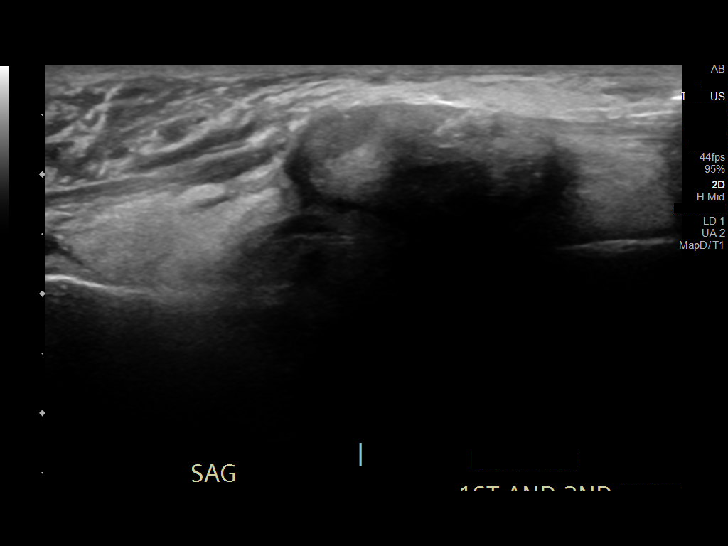
[im 15/39]
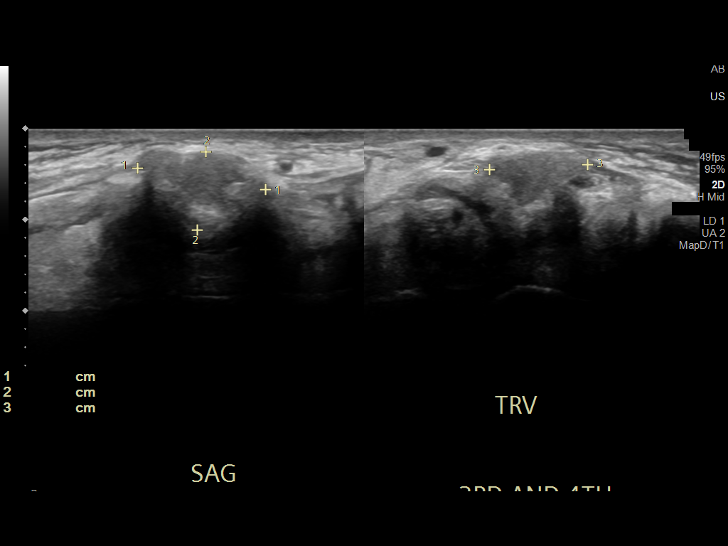
[im 18/39]
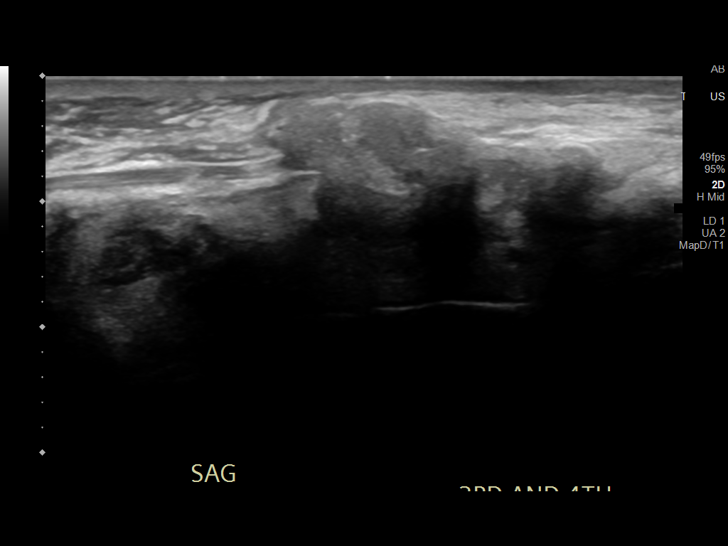
[im 21/39]
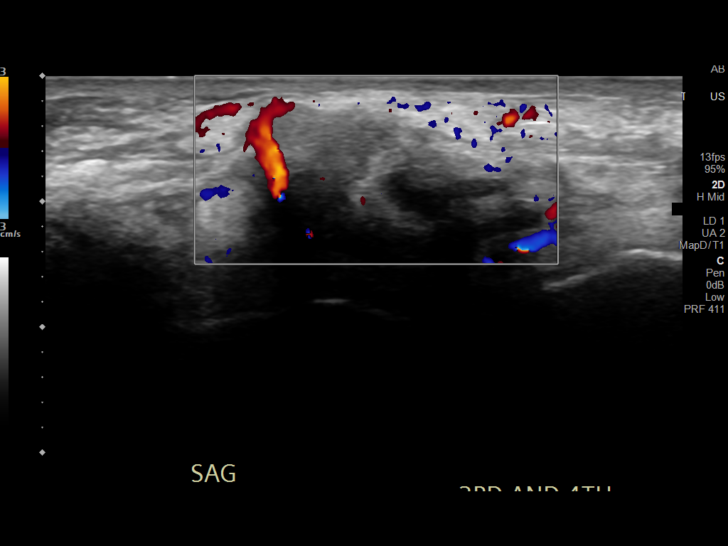
[im 24/39]
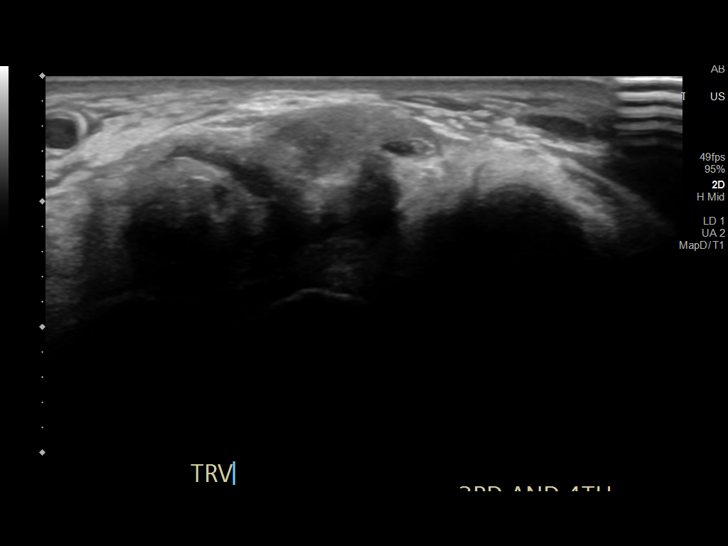
[im 26/39]
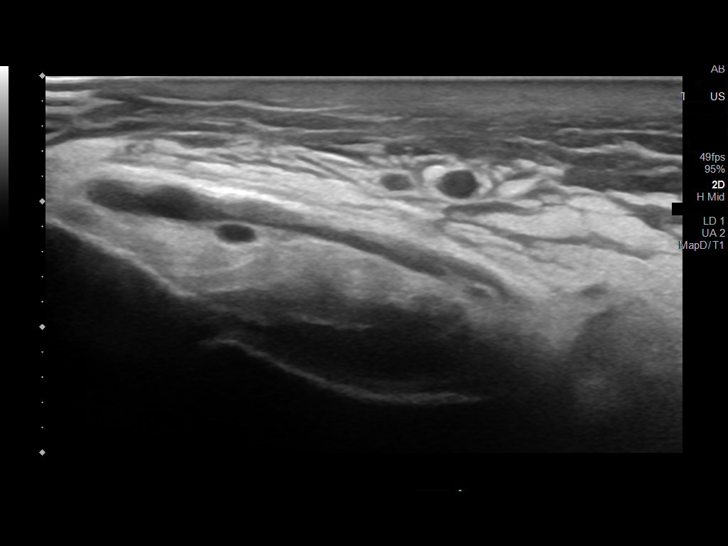
[im 29/39]
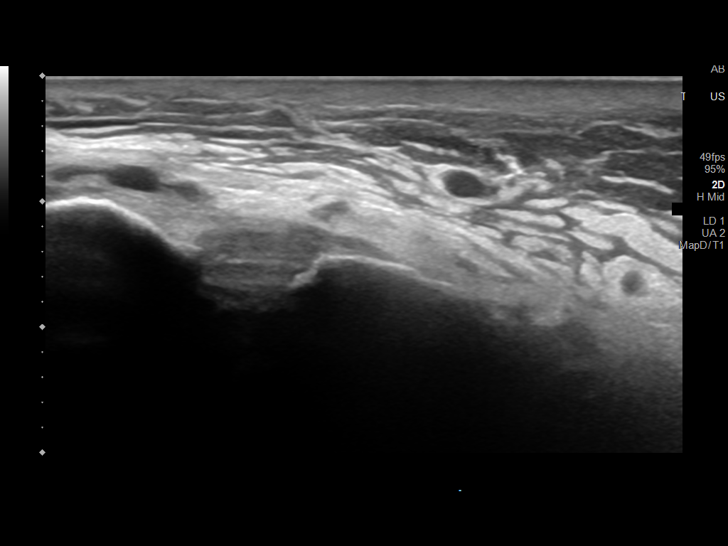
[im 32/39]
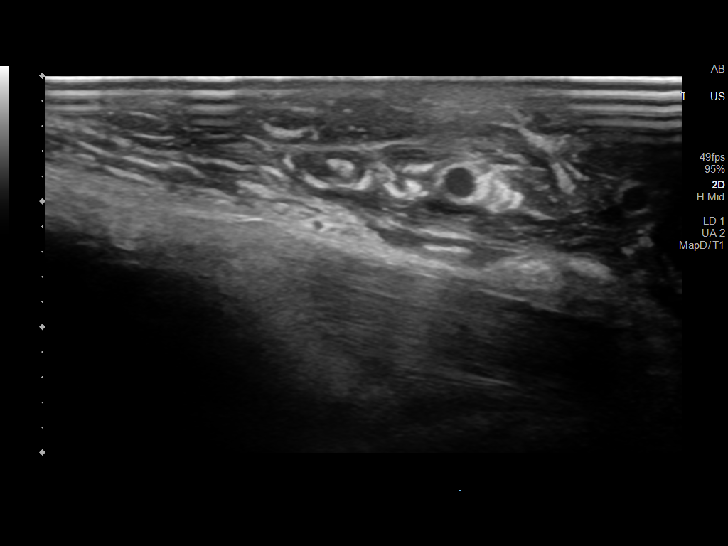
[im 35/39]
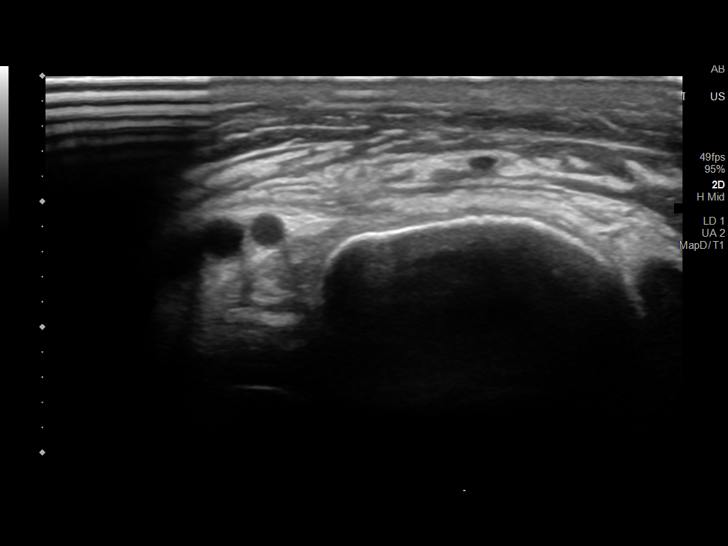
[im 39/39]
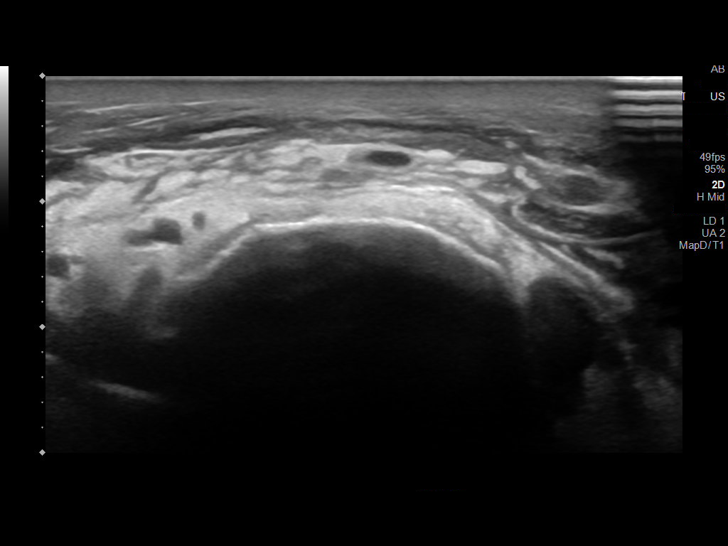

[14 of 25 positions shown; findings below may reference images not displayed]

FINDINGS: Targeted sonography of the right foot performed. Marked subcutaneous
edema within the right foot and ankle. Hypoechoic mass or fluid
collection between the first and second digit measuring 1.8 x 1 x
1.5 cm and between the third and fourth digit measuring 1.4 x 0.9 x
1.1 cm
IMPRESSION: Hypoechoic avascular masses versus complex fluid collections between
the first and second digits and the third and fourth digits,
possibly corresponding to MRI nodules, see previous report.
Follow-up contrast enhanced exam could be considered.

## 2021-04-16 NOTE — Progress Notes (Signed)
PROGRESS NOTE   Subjective/Complaints:  Pt reports got snack, but was yogurt and is lactose intolerant- added that to chart, so hopefully will get dairy free snacks.   Also, said mother wants him to go to SNF?? SW to addressing this per pt.  No fevers, chills.  Got GC swabs per pt.  Feet still hurt- was just as bad yesterday- hasn't stepped today.  Swelling, even he admits is better.   ROS:   Pt denies SOB, abd pain, CP, N/V/C/D, and vision changes      Objective:   DG Chest 2 View  Result Date: 04/14/2021 CLINICAL DATA:  Elevated white blood cell count EXAM: CHEST - 2 VIEW COMPARISON:  04/13/2010 FINDINGS: Scarring in the right upper lobe. Left lung clear. Heart is normal size. No effusions or acute bony abnormality. IMPRESSION: No active cardiopulmonary disease. Electronically Signed   By: Rolm Baptise M.D.   On: 04/14/2021 17:15   MR FOOT RIGHT WO CONTRAST  Result Date: 04/15/2021 CLINICAL DATA:  Foot pain and swelling.  Diabetic. EXAM: MRI OF THE RIGHT FOREFOOT WITHOUT CONTRAST TECHNIQUE: Multiplanar, multisequence MR imaging of the right foot was performed. No intravenous contrast was administered. COMPARISON:  Radiographs 04/14/2021 FINDINGS: Bones/Joint/Cartilage Diffuse subcutaneous soft tissue swelling/edema/fluid mainly along the dorsum of the foot. No obvious discrete abscess. There are masslike areas of soft tissue density on the dorsum of the foot. A large masslike area is emanating between the first and second metatarsal bases and protruding into the subcutaneous tissues and the second area is around the dorsum of the fourth metatarsal. These have relatively low T1 and T2 signal intensity and could be a synovial process or fibrous tissue. Gout would certainly be a consideration. Repeat study with contrast may be helpful for further evaluation. Diffuse edema like signal changes involving the foot musculature suggesting  myofasciitis. No obvious findings for pyomyositis. There is a fluid collection surrounding the extensor tendon of the second toe which is likely tenosynovitis but could not exclude septic tenosynovitis. No MR findings suspicious for septic arthritis or osteomyelitis. There are age advanced midfoot degenerative changes with erosive changes. Findings could be due to an erosive arthropathy or early neuropathic disease. IMPRESSION: 1. Diffuse subcutaneous soft tissue swelling/edema/fluid mainly along the dorsum of the foot suggesting cellulitis. No obvious discrete abscess. 2. Diffuse myofasciitis without definite findings for pyomyositis. 3. No MR findings for septic arthritis or osteomyelitis. 4. Age advanced midfoot degenerative changes with erosive changes. Findings could be due to an erosive arthropathy such as gout or early neuropathic disease. 5. Masslike densities on the dorsum of the foot have low T2 signal intensity suggesting synovial process or fibrous tissue. Tophaceous gout would certainly be a consideration. Repeat study with contrast may be helpful. 6. Fluid collection surrounding the extensor tendon of the second toe which is likely tenosynovitis. Electronically Signed   By: Marijo Sanes M.D.   On: 04/15/2021 08:21   DG Foot Complete Left  Result Date: 04/14/2021 CLINICAL DATA:  Elevated white blood cell count. EXAM: LEFT FOOT - COMPLETE 3+ VIEW COMPARISON:  None. FINDINGS: Soft tissue swelling along the dorsum of the foot. No acute bony abnormality. Specifically, no fracture,  subluxation, or dislocation. No bone destruction to suggest osteomyelitis. IMPRESSION: No acute bony abnormality. Electronically Signed   By: Rolm Baptise M.D.   On: 04/14/2021 17:15   DG Foot Complete Right  Result Date: 04/14/2021 CLINICAL DATA:  Elevated white blood cell count EXAM: RIGHT FOOT COMPLETE - 3+ VIEW COMPARISON:  None. FINDINGS: Soft tissue swelling along the dorsum of the foot. Radiopaque densities are  seen in the dorsal soft tissues of unknown etiology, new since prior study. There is cortical lucency noted along the dorsal surface of the medial kidney a form. Cannot exclude osteomyelitis. No fracture, subluxation or dislocation. IMPRESSION: Lucency of along the dorsal surface of the medial cuneiform on the lateral view concerning for osteomyelitis. This could be further evaluated with MRI. Linear radiopaque densities within the dorsal soft tissues of unknown etiology. This could reflect soft tissue calcifications. Marked dorsal soft tissue swelling. Electronically Signed   By: Rolm Baptise M.D.   On: 04/14/2021 17:18   Recent Labs    04/15/21 0541 04/16/21 0533  WBC 17.3* 13.3*  HGB 9.0* 8.5*  HCT 28.4* 27.4*  PLT 189 205     Recent Labs    04/15/21 0541 04/16/21 0533  NA 134* 133*  K 4.2 4.3  CL 95* 99  CO2 25 22  GLUCOSE 98 116*  BUN 43* 45*  CREATININE 1.74* 1.63*  CALCIUM 9.6 9.4    Intake/Output Summary (Last 24 hours) at 04/16/2021 1245 Last data filed at 04/16/2021 0858 Gross per 24 hour  Intake 540 ml  Output 1000 ml  Net -460 ml         Physical Exam: Vital Signs Blood pressure 135/83, pulse 91, temperature 99.2 F (37.3 C), resp. rate 18, height '5\' 10"'$  (1.778 m), weight 86 kg, SpO2 98 %.       General: awake, alert, appropriate,lying supine in bed;  NAD HENT: conjugate gaze; oropharynx moist CV: regular rate- HR better; no JVD Pulmonary: CTA B/L; no W/R/R- good air movement GI: soft, NT, ND, (+)BS Psychiatric: appropriate; interactive Neurological: alert Ext: legs 1+ edema and pedal edema also greatly improved- down to 1-2+ edema on feet B/L - still very TTP- with even light touch- not warm or red.  Musculoskeletal: TTP over midline low back- not c/w CVA tenderness    Comments: Bilateral feet  with 2+ edema left>right. Ues- deltoids and biceps 4-/5, Triceps 4-/5, WE/grip and finger abd 4+/5 all B/L LE's - HF 2+/5, KE 3-/5, PF 3-/5, DF 2/5 (Pain?)  and PF 2/5 (pain?)- all B/L L shoulder- TTP on top/AC joint and RTC/posterior Skin:    General: Skin is warm and dry.    Comments: multiple scars on leg   Neurological:    Mental Status: He is alert and oriented to person, place, and time.    Comments: Appears decreased sensation to light touch in LE's from knees down, but pt wasn't clear  - same as what was on admission- decreased to LT in LE's worse on LLE.    Assessment/Plan: 1. Functional deficits which require 3+ hours per day of interdisciplinary therapy in a comprehensive inpatient rehab setting. Physiatrist is providing close team supervision and 24 hour management of active medical problems listed below. Physiatrist and rehab team continue to assess barriers to discharge/monitor patient progress toward functional and medical goals  Care Tool:  Bathing    Body parts bathed by patient: Right arm, Left arm, Chest, Abdomen, Right upper leg, Left upper leg, Face, Front perineal area, Right  lower leg, Left lower leg, Buttocks   Body parts bathed by helper: Buttocks, Right lower leg, Left lower leg     Bathing assist Assist Level: Set up assist (per most recent staff documentation)     Upper Body Dressing/Undressing Upper body dressing   What is the patient wearing?: Pull over shirt    Upper body assist Assist Level: Independent with assistive device (per most recent staff documentation)    Lower Body Dressing/Undressing Lower body dressing      What is the patient wearing?: Underwear/pull up, Pants     Lower body assist Assist for lower body dressing: Independent with assitive device (per most recent staff documentation)     Toileting Toileting    Toileting assist Assist for toileting: Independent with assistive device     Transfers Chair/bed transfer  Transfers assist     Chair/bed transfer assist level: Independent with assistive device Chair/bed transfer assistive device: Engineer, agricultural assist      Assist level: Supervision/Verbal cueing Assistive device: Walker-rolling Max distance: 300 ft   Walk 10 feet activity   Assist     Assist level: Independent with assistive device Assistive device: Walker-rolling   Walk 50 feet activity   Assist Walk 50 feet with 2 turns activity did not occur: Safety/medical concerns  Assist level: Independent with assistive device Assistive device: Walker-rolling    Walk 150 feet activity   Assist Walk 150 feet activity did not occur: Safety/medical concerns  Assist level: Supervision/Verbal cueing Assistive device: Walker-rolling    Walk 10 feet on uneven surface  activity   Assist Walk 10 feet on uneven surfaces activity did not occur: Safety/medical concerns   Assist level: Supervision/Verbal cueing Assistive device: Aeronautical engineer Will patient use wheelchair at discharge?: Yes Type of Wheelchair: Manual Wheelchair activity did not occur: Refused (Pt stated he would not leave his room)  Wheelchair assist level: Independent Max wheelchair distance: 500 ft    Wheelchair 50 feet with 2 turns activity    Assist    Wheelchair 50 feet with 2 turns activity did not occur: Refused   Assist Level: Independent   Wheelchair 150 feet activity     Assist  Wheelchair 150 feet activity did not occur: Refused   Assist Level: Independent   Blood pressure 135/83, pulse 91, temperature 99.2 F (37.3 C), resp. rate 18, height '5\' 10"'$  (1.778 m), weight 86 kg, SpO2 98 %.  Medical Problem List and Plan: 1.  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 Maybe earlier- supervision to mod I- likely needs Mod I  -Continue CIR therapies including PT, OT, and SLP  8/9- according to therapy, has been discharged  from PT and OT- was mod I- will hopefully be ready for d/c in next 1-2 days-   8/11- will restart PT and OT orders/eval and treat- since pt found to actually walk no more than 3-5 steps right now- on Sunday was walking mod I in room.   8/12- hopefully restarting PT and OT today and will see if he needs SLP- spoke with pt- he's to call SW to discuss that his mother wants him to go to SNF.  2.  Impaired mobility,  ambulating >200 feet: d/c Lovenox  8/12- walking short distances still- doesn't want lovenox.  3. Left shoulder/back/foot pain/Pain/diabetic peripheral neuropathy: continue Oxycodone prn. Increase steroids for gout flare. Will do 60 mg daily x 2 days, then taper. Pt rates pain as "11/10". Needs to bring in shoes so able to walk better; If steroids PO doesn't work, might need L shoulder injection. Pt hates needles.   7/27- pt reports tylenol helpful- con't regimen  8/1- added Lidoderm patches for Shoulder and back pain. 8pm to 8am  8/6 increase gabapentin to '300mg'$  TID  8/9- pt insistent "oxy doesn't work- but already taking 10 mg q4 hours around the clock- will not increase.   8/12- notes pain is "still bad" in feet- per nursing, watching the clock for pain meds- con't regimen 4. Mood: LCSW to follow for evaluation and support.              -antipsychotic agents: N/A 5. Neuropsych: This patient is capable of making decisions on his own behalf. 6. Skin/Wound Care: Routine pressure relief measures.  7. Fluids/Electrolytes/Nutrition: Strict I/O. Check CMET in am 8. PJP PNA/leukocytosis:  Duration of meds discussed with pharmacy who relayed that patient has completed 21+ days of steroids and primaquine. --Clindamycin thorough 07/29 to complete 21 days. 8/10- calling ID due to rising WBC to 16.4- ID consulted- labs in am-xrays and blood Cx's 8/12- Ortho feels foot findings are c/w gout-  9. Gout flare: Was on colchicine 0.6 mg daily PTA. --prednisone taper almost completed 8/9- will give  colchicine dose x2 again to see if will help feet pain.  10. T2DM: Hgb A1c- 5.4. Was on glucotrol/metformin bid PTA.             --continue Lantus while on steroids with SSI for elevated BS.  7/27- BG's 94-159- good considering steroids- con't to monitor  8/6- BG's controlled, in spite of steroids- con't regimen 11. Anxiety d/o: Was on Klonopin TID PTA. --bid effective at this time. 12. HTN: Monitor BP tid--continue hydralazine and Norvasc.  7/27- BP slightly elevated- will monitor for trend.   8/6- BP under fair/borderline control 13. HIV: On Epivir and Tivicay 14. Anemia: H/H steadily improving. Recheck H/H in am.  7/27- Hb 8.9- still above a few days ago- con't to monitor  8/1- Hb back down to 7.9- has gone up and donw 1 unit- if still down next week, might need more intervention? 15. Hypomagnesemia: Was supplemented yesterday. --Recheck in am.  7/27- wasn't done/ordered- so will check in AM- last Mg 1.3- will give Magnesium based on labs in AM.   7/28- will add MgOx 400 mg BID and recheck qMonday.   8/1- pt refuses more labs except for 1x/week so cannot do IV_ will increase Mg, in spite of fact Mg still 1.3- like last week. This is a CHRONIC low Mg level, FYI.   8/8- Mg 1.1- has to go home on PO Mg. Will see if can convince him to get IV Mg.  16. Intermittent Leucocytosis: Monitor for fevers and other signs of infection.   7/27- WBC 10.3- will monitor  8/1- WBC 11.0- but no signs/Sx's of infection- afebrile  8/9- WBC 14.7- U/A and CXR (-)- will recheck in AM- no fever (Tm 99.3- per staff, covered in blankets). Will monitor and recheck labs 17. Dysphagia- improved- now just passed to regular diet with thin liquids as of today. 18. Urinary retention- has failed at least 1 voiding trial on 7/20- on Flomax 0.4 mg nightly- will  make sure has been on 7 days before trying another voiding trial.  7/29- Flomax dropped off when got here, so don't remove foley until at least 8/1- since restarted  7/28   voiding   8/8- is voiding- foley out.  19. Bowel incontinence- will see if can help pt become continent with improved function.   7/27- nursing got up to [facility] last night.  20. Chipped nails- will see if can help?  7/28- says it's better after Olin Hauser helping him.  21. GERD  87/28- will restart Protonix- also has tums prn. 22. Pressure injury to buttocks: offload q43mnutes 23. Bilateral lower extremity swelling: ice, elevated, compression garments 24. Shoulder pain: continue lidocaine patch and voltaren gel.  25. Nearsighted- schedule outpatient ophthalmology eval- should be checked for diabetic retinopathy 26. Diabetic peripheral neuropathy: will benefit from outpatient Qutenza. Ordered diabetic shoes  -gabapentin increased as above  8/9- likely cause of some of pain- doesn't respond to oxy 27. Lower extremity edema:resumed '10mg'$  daily Lasix.  28. AKI:   8/8- Cr 1.38- back and forth- from 1.07 to 1.40- could be Septra??? But will let him f/u with ID/or get PCP.   8/9- a lot of LE edema- s/p Lasix 80 mg IV again- Renal following- appreciate their input. BMP in AM   8/10- edema is improving- waiting for nephrology note 29. Leukocytosis  8/11- don't know why- based on R foot MRI, has myofasciitis of R foot- called Ortho and also called ID due to rising leukocytosis- WBC 17.3 today-   - 8/12- ID in place- getting Rh factor and quantiferon TB testing- also GC swabs- said didn't get IR to check 1st toe- for gout?      LOS: 17 days A FACE TO FACE EVALUATION WAS PERFORMED  Pawel Soules 04/16/2021, 12:45 PM

## 2021-04-16 NOTE — Progress Notes (Signed)
Radiology team consulted for possible intervention on the right foot to include either fluid aspiration or mass biopsy. MRI imaging does not reveal fluid or mass amenable to percutaneous intervention. Radiology recommends ultrasound imaging of right foot for further evaluation of potential area to target.   No procedure planned at this point but will await US imaging results.   Soyla Dryer, [place] [phone] 04/16/2021, 12:51 PM

## 2021-04-16 NOTE — Progress Notes (Signed)
Nephrology Progress Note:   Subjective: He had 1.5 liters UOP over 8/11.  His feet are very uncomfortable.  Swelling is down.  They are worried about gout  Review of systems:   Denies shortness of breath  Decreased mobility 2/2 foot pain Denies n/v Denies cp   Objective Vital signs in last 24 hours: Vitals:   04/15/21 1922 04/16/21 0350 04/16/21 0500 04/16/21 1322  BP: 123/87 135/83  123/79  Pulse: (!) 109 91  (!) 110  Resp: '17 18  16  '$ Temp: 99 F (37.2 C) 99.2 F (37.3 C)  99.5 F (37.5 C)  TempSrc:      SpO2: 99% 98%  98%  Weight:   86 kg   Height:       Weight change: -0.4 kg  Intake/Output Summary (Last 24 hours) at 04/16/2021 1451 Last data filed at 04/16/2021 0858 Gross per 24 hour  Intake 300 ml  Output 1000 ml  Net -700 ml    Assessment/ Plan: Pt is a 45 y.o. yo male with HIV/AIDS, DM, baseline CKD who was admitted on 03/30/2021 with acute hypoxic resp failure with sepsis and AKI.  New diagnosis HIV and with PJP PNA during acute hospitalization now s/p course of abx for same   Assessment/Plan: 1. Hx AKI  AKI in the setting of hypoxic resp failure, ATN with septic shock and also urinary retention.  Off and on CRRT-  most recently stopped on 7/15 and temp HD catheter out.  Note 02/2020 Cr 2.80 and 05/2011 Cr 0.90 - only prior labs in [facility] system.  reported baseline crt in the high 2's however this was based on limited data and may have just been reflective of prior AKI episode.   - Reassess lasix tomorrow - defer today   - strict ins/outs ordered - daily weights ordered  2. Chronic hypoxic resp failure s/p trach - now decannulated  3. HIV - regimen per primary team/ID  4. HTN - controlled. Recent addition of metoprolol. Stopped amlodipine in the event is contributing to swelling  5. Anemia normocytic - no acute need for PRBC's or ESA.  Iron deficiency. IV iron x 2 doses ordered  6. Impaired mobility - per CIR team  7.  Urinary retention-  continue flomax and  note foley is removed  8. Hypomagnesemia  - repleted   Labs: Basic Metabolic Panel: Recent Labs  Lab 04/14/21 0541 04/15/21 0541 04/16/21 0533  NA 135 134* 133*  K 4.2 4.2 4.3  CL 100 95* 99  CO2 '24 25 22  '$ GLUCOSE 132* 98 116*  BUN 34* 43* 45*  CREATININE 1.35* 1.74* 1.63*  CALCIUM 9.1 9.6 9.4   Liver Function Tests: Recent Labs  Lab 04/12/21 0644  ALBUMIN 2.8*    No results for input(s): LIPASE, AMYLASE in the last 168 hours. No results for input(s): AMMONIA in the last 168 hours. CBC: Recent Labs  Lab 04/13/21 0502 04/14/21 0541 04/15/21 0541 04/16/21 0533  WBC 14.7* 16.4* 17.3* 13.3*  NEUTROABS  --  12.7* 13.6* 10.1*  HGB 7.9* 8.5* 9.0* 8.5*  HCT 25.4* 26.9* 28.4* 27.4*  MCV 95.8 95.7 94.7 95.8  PLT 191 194 189 205    Cardiac Enzymes: No results for input(s): CKTOTAL, CKMB, CKMBINDEX, TROPONINI in the last 168 hours. CBG: Recent Labs  Lab 04/15/21 1157 04/15/21 1647 04/15/21 2047 04/16/21 0609 04/16/21 1128  GLUCAP 162* 133* 129* 106* 100*    Iron Studies:  No results for input(s): IRON, TIBC, TRANSFERRIN, FERRITIN in  the last 72 hours.   Studies/Results: DG Chest 2 View  Result Date: 04/14/2021 CLINICAL DATA:  Elevated white blood cell count EXAM: CHEST - 2 VIEW COMPARISON:  04/13/2010 FINDINGS: Scarring in the right upper lobe. Left lung clear. Heart is normal size. No effusions or acute bony abnormality. IMPRESSION: No active cardiopulmonary disease. Electronically Signed   By: Rolm Baptise M.D.   On: 04/14/2021 17:15   MR FOOT RIGHT WO CONTRAST  Result Date: 04/15/2021 CLINICAL DATA:  Foot pain and swelling.  Diabetic. EXAM: MRI OF THE RIGHT FOREFOOT WITHOUT CONTRAST TECHNIQUE: Multiplanar, multisequence MR imaging of the right foot was performed. No intravenous contrast was administered. COMPARISON:  Radiographs 04/14/2021 FINDINGS: Bones/Joint/Cartilage Diffuse subcutaneous soft tissue swelling/edema/fluid mainly along the dorsum of the  foot. No obvious discrete abscess. There are masslike areas of soft tissue density on the dorsum of the foot. A large masslike area is emanating between the first and second metatarsal bases and protruding into the subcutaneous tissues and the second area is around the dorsum of the fourth metatarsal. These have relatively low T1 and T2 signal intensity and could be a synovial process or fibrous tissue. Gout would certainly be a consideration. Repeat study with contrast may be helpful for further evaluation. Diffuse edema like signal changes involving the foot musculature suggesting myofasciitis. No obvious findings for pyomyositis. There is a fluid collection surrounding the extensor tendon of the second toe which is likely tenosynovitis but could not exclude septic tenosynovitis. No MR findings suspicious for septic arthritis or osteomyelitis. There are age advanced midfoot degenerative changes with erosive changes. Findings could be due to an erosive arthropathy or early neuropathic disease. IMPRESSION: 1. Diffuse subcutaneous soft tissue swelling/edema/fluid mainly along the dorsum of the foot suggesting cellulitis. No obvious discrete abscess. 2. Diffuse myofasciitis without definite findings for pyomyositis. 3. No MR findings for septic arthritis or osteomyelitis. 4. Age advanced midfoot degenerative changes with erosive changes. Findings could be due to an erosive arthropathy such as gout or early neuropathic disease. 5. Masslike densities on the dorsum of the foot have low T2 signal intensity suggesting synovial process or fibrous tissue. Tophaceous gout would certainly be a consideration. Repeat study with contrast may be helpful. 6. Fluid collection surrounding the extensor tendon of the second toe which is likely tenosynovitis. Electronically Signed   By: Marijo Sanes M.D.   On: 04/15/2021 08:21   DG Foot Complete Left  Result Date: 04/14/2021 CLINICAL DATA:  Elevated white blood cell count. EXAM:  LEFT FOOT - COMPLETE 3+ VIEW COMPARISON:  None. FINDINGS: Soft tissue swelling along the dorsum of the foot. No acute bony abnormality. Specifically, no fracture, subluxation, or dislocation. No bone destruction to suggest osteomyelitis. IMPRESSION: No acute bony abnormality. Electronically Signed   By: Rolm Baptise M.D.   On: 04/14/2021 17:15   DG Foot Complete Right  Result Date: 04/14/2021 CLINICAL DATA:  Elevated white blood cell count EXAM: RIGHT FOOT COMPLETE - 3+ VIEW COMPARISON:  None. FINDINGS: Soft tissue swelling along the dorsum of the foot. Radiopaque densities are seen in the dorsal soft tissues of unknown etiology, new since prior study. There is cortical lucency noted along the dorsal surface of the medial kidney a form. Cannot exclude osteomyelitis. No fracture, subluxation or dislocation. IMPRESSION: Lucency of along the dorsal surface of the medial cuneiform on the lateral view concerning for osteomyelitis. This could be further evaluated with MRI. Linear radiopaque densities within the dorsal soft tissues of unknown etiology. This could reflect  soft tissue calcifications. Marked dorsal soft tissue swelling. Electronically Signed   By: Rolm Baptise M.D.   On: 04/14/2021 17:18   Medications: Infusions:    Scheduled Medications:  (feeding supplement) PROSource Plus  30 mL Oral TID BM   clonazePAM  0.5 mg Oral TID   dapsone  100 mg Oral Daily   diclofenac Sodium  2 g Topical QID   dolutegravir  50 mg Oral Daily   gabapentin  300 mg Oral Q12H   hydrALAZINE  25 mg Oral Q8H   insulin aspart  0-15 Units Subcutaneous TID WC   insulin aspart  0-5 Units Subcutaneous QHS   insulin glargine-yfgn  5 Units Subcutaneous Daily   lamiVUDine  300 mg Oral Daily   lidocaine  1 patch Transdermal Q24H   lidocaine  2 patch Transdermal Q24H   magnesium oxide  400 mg Oral BID   metoprolol tartrate  25 mg Oral BID   multivitamin with minerals  1 tablet Oral Daily   pantoprazole  40 mg Oral Daily    tamsulosin  0.4 mg Oral QPC supper    have reviewed scheduled and prn medications.  Physical Exam:  General adult male in bed in no acute distress HEENT normocephalic atraumatic extraocular movements intact sclera anicteric Neck supple trachea midline; bandage over old trach site Lungs clear to auscultation bilaterally normal work of breathing at rest on room air Heart S1S2 no rub Abdomen soft nontender nondistended Extremities 1-2+ edema of feet; tender; feet are wrapped. No edema of legs Psych normal mood and affect Neuro - awake and conversant; follows commands and provides hx   Claudia Desanctis, MD 04/16/2021,2:51 PM

## 2021-04-16 NOTE — Plan of Care (Signed)
  Problem: RH Balance Goal: LTG Patient will maintain dynamic standing with ADLs (OT) Description: LTG:  Patient will maintain dynamic standing balance with assist during activities of daily living (OT)  Flowsheets (Taken 04/16/2021 1615) LTG: Pt will maintain dynamic standing balance during ADLs with: Independent with assistive device   Problem: RH Toilet Transfers Goal: LTG Patient will perform toilet transfers w/assist (OT) Description: LTG: Patient will perform toilet transfers with assist, with/without cues using equipment (OT) Flowsheets (Taken 04/16/2021 1615) LTG: Pt will perform toilet transfers with assistance level of: Independent with assistive device

## 2021-04-16 NOTE — Progress Notes (Signed)
[facility]  Date of Admission:  03/30/2021     CC: bilateral feet swelling/leukocytosis                                      Abx: 7/29-c dapsone  Clindamycin/primaquine by 7/29 for pjp                                                       Assessment/plan: 45 y.o. male with HIV/AIDS successfully suppressed on Dovato after initial period of imperfect adherence, admitted for sepsis and respiratory failure treated empirically for pjp pna with clindamycin/primaquin in setting aki. Patient also finished tx for VAP (mssa/mrse). Patient improved from respiratory standpoint. ID reconsulted for bilateral feet swelling/pain       #hiv/aids #pjp pna Recent Labs       Lab Results  Component Value Date    HIV1RNAQUANT 240 03/16/2021    His cd4 was 35 on 6/13, but increased to 324 on 7/12 He finished his PJP treatment on 7/28  Would continue secondary prophylaxis for 3 months with cd4 >200 being the goal for the 3 months. On dapsone at this time He is being maintained on Dovato equivalent (tivicay and lamivudine) here     #aki Previously requires dialysis here but recovered   #4 bilateral foot pain/swelling Suspect gout -- patient report hx of gout but never confirmed with crystal analysis Seems his swelling/pain worse just at the end of a prednisone taper Mri showed mainly periarticular structure inflammation and nodules/mass ?tophi in the right setting  Other ddx is less as suspicious disseminated gonococcal infection, poncet's reactive arthritis/periarthritis, other reactive process, rheumatologic process  He has an indeterminate quantiferon gold when his cd4 count was low. Will recheck    Plan: - f/u triple screen (anal, oropharyngeal swab, and urine cytology) for gc/chlamydia - await IR input in regard to biopsying the mass on the right foot - send ana, RF, CCP, ds-dna Antibody, repeat quantiferon gold - PLEASE ENSURE THAT WHEN HE NEARS  DC THAT ID is CONTACTED TO ENSURE HE Elderon. - Continue Dapsone prophylaxis for pjp - continue tivicay/lamivudine (on discharge patient to get 30 day dovato)   - if fever, chill, pustular rash/other joint pain, would add ceftriaxone 1 gram iv daily for empiric disseminated gonococcal infection   - patient has ID clinic f/u with dr Tommy Medal on 8/18 @ 145  - discussed with primary team   I spent more than 35 minute reviewing data/chart, and coordinating care and >50% direct face to face time providing counseling/discussing diagnostics/treatment plan with patient   Principal Problem:   Intensive care (ICU) myopathy Active Problems:   AIDS (acquired immune deficiency syndrome) (HCC)   Debility   Gout attack   Leukocytosis   Bilateral swelling of feet   Allergies  Allergen Reactions   Lactose Intolerance (Gi)     Scheduled Meds:  (feeding supplement) PROSource Plus  30 mL Oral TID BM   clonazePAM  0.5 mg Oral TID   dapsone  100 mg Oral Daily   diclofenac Sodium  2 g Topical QID   dolutegravir  50 mg Oral Daily  gabapentin  300 mg Oral Q12H   hydrALAZINE  25 mg Oral Q8H   insulin aspart  0-15 Units Subcutaneous TID WC   insulin aspart  0-5 Units Subcutaneous QHS   insulin glargine-yfgn  5 Units Subcutaneous Daily   lamiVUDine  300 mg Oral Daily   lidocaine  1 patch Transdermal Q24H   lidocaine  2 patch Transdermal Q24H   magnesium oxide  400 mg Oral BID   metoprolol tartrate  25 mg Oral BID   multivitamin with minerals  1 tablet Oral Daily   pantoprazole  40 mg Oral Daily   tamsulosin  0.4 mg Oral QPC supper   Continuous Infusions: PRN Meds:.acetaminophen, albuterol, alum & mag hydroxide-simeth, bisacodyl, calcium carbonate, diphenhydrAMINE, guaiFENesin-dextromethorphan, lip balm, oxyCODONE, polyethylene glycol, prochlorperazine **OR** prochlorperazine **OR** prochlorperazine, traZODone   SUBJECTIVE: Still lots of pain  swellign bilateral feet No fever Pending triple screen result  IR can't see any joint fluid in the right foot -- await further discussion about biopsying the mass  No n/v/diarrhea No f/c  Review of Systems: ROS All other ROS was negative, except mentioned above     OBJECTIVE: Vitals:   04/15/21 0647 04/15/21 1922 04/16/21 0350 04/16/21 0500  BP:  123/87 135/83   Pulse: (!) 109 (!) 109 91   Resp:  17 18   Temp: 99.5 F (37.5 C) 99 F (37.2 C) 99.2 F (37.3 C)   TempSrc: Oral     SpO2: 96% 99% 98%   Weight:    86 kg  Height:       Body mass index is 27.2 kg/m.  Physical Exam  General/constitutional: no distress, pleasant HEENT: Normocephalic, PER, Conj Clear, EOMI, Oropharynx clear Neck supple CV: rrr no mrg Lungs: clear to auscultation, normal respiratory effort Abd: Soft, Nontender Ext: no edema Skin: No Rash Neuro: nonfocal MSK: swollen/tender bilateral feet -- no erythema    Lab Results Lab Results  Component Value Date   WBC 13.3 (H) 04/16/2021   HGB 8.5 (L) 04/16/2021   HCT 27.4 (L) 04/16/2021   MCV 95.8 04/16/2021   PLT 205 04/16/2021    Lab Results  Component Value Date   CREATININE 1.63 (H) 04/16/2021   BUN 45 (H) 04/16/2021   NA 133 (L) 04/16/2021   K 4.3 04/16/2021   CL 99 04/16/2021   CO2 22 04/16/2021    Lab Results  Component Value Date   ALT 9 04/01/2021   AST 17 04/01/2021   ALKPHOS 64 04/01/2021   BILITOT 0.2 (L) 04/01/2021      Microbiology: Recent Results (from the past 240 hour(s))  Urine Culture     Status: Abnormal   Collection Time: 04/13/21  8:05 AM   Specimen: Urine, Clean Catch  Result Value Ref Range Status   Specimen Description URINE, CLEAN CATCH  Final   Special Requests NONE  Final   Culture (A)  Final    <10,000 COLONIES/mL INSIGNIFICANT GROWTH Performed at [facility], [address] [place], [place] [address]    Report Status 04/14/2021 FINAL  Final  Culture, blood (routine x 2)      Status: None (Preliminary result)   Collection Time: 04/14/21 11:19 AM   Specimen: BLOOD  Result Value Ref Range Status   Specimen Description BLOOD RIGHT ANTECUBITAL  Final   Special Requests   Final    BOTTLES DRAWN AEROBIC AND ANAEROBIC Blood Culture adequate volume   Culture   Final    NO GROWTH < 24  HOURS Performed at [facility], [place] [address] [place], [place] [address]    Report Status PENDING  Incomplete  Culture, blood (routine x 2)     Status: None (Preliminary result)   Collection Time: 04/14/21 11:36 AM   Specimen: BLOOD LEFT FOREARM  Result Value Ref Range Status   Specimen Description BLOOD LEFT FOREARM  Final   Special Requests   Final    BOTTLES DRAWN AEROBIC AND ANAEROBIC Blood Culture adequate volume   Culture   Final    NO GROWTH < 24 HOURS Performed at [facility], [place] [address], [place],  [address]    Report Status PENDING  Incomplete     Serology:   Imaging: If present, new imagings (plain films, ct scans, and mri) have been personally visualized and interpreted; radiology reports have been reviewed. Decision making incorporated into the Impression / Recommendations.  8/10 mri right foot 1. Diffuse subcutaneous soft tissue swelling/edema/fluid mainly along the dorsum of the foot suggesting cellulitis. No obvious discrete abscess. 2. Diffuse myofasciitis without definite findings for pyomyositis. 3. No MR findings for septic arthritis or osteomyelitis. 4. Age advanced midfoot degenerative changes with erosive changes. Findings could be due to an erosive arthropathy such as gout or early neuropathic disease. 5. Masslike densities on the dorsum of the foot have low T2 signal intensity suggesting synovial process or fibrous tissue. Tophaceous gout would certainly be a consideration. Repeat study with contrast may be helpful. 6. Fluid collection surrounding the extensor tendon of the second toe which is likely tenosynovitis.      7/01 tte  1. Left ventricular ejection fraction, by estimation, is 60 to 65%. The  left ventricle has normal function. The left ventricle has no regional  wall motion abnormalities. Left ventricular diastolic parameters were  normal.   2. Right ventricular systolic function is low normal. The right  ventricular size is normal. There is mildly elevated pulmonary artery  systolic pressure.   3. Right atrial size was mildly dilated.   4. The mitral valve is normal in structure. No evidence of mitral valve  regurgitation.   5. Tricuspid valve regurgitation is mild to moderate.   6. The aortic valve is normal in structure. Aortic valve regurgitation is  not visualized.    Jabier Mutton, [facility] [place] [phone] pager    04/16/2021, 11:21 AM

## 2021-04-16 NOTE — Progress Notes (Signed)
Occupational Therapy Session Note  Patient Details  Name: RAMIR KALLAY MRN: [id number] Date of Birth: 08/28/1976  Today's Date: 04/17/2021 OT Individual Time: 1000-1026 OT Individual Time Calculation (min): 26 min  34 minutes missed   Skilled Therapeutic Interventions/Progress Updates:  Pt greeted in bed, trembling due to pain, describes his pain as "11/10." Per pt, premedicated. He declined OOB activity, very distracted and distraught/anxious regarding his high pain level. OT played relaxing forest sounds on his room computer, guided him through deep breathing exercises and also provided him lavender aromatherapy for pillowcase. Continued education regarding holistic pain mgt using mindfulness techniques. Also assisted with repositioning in bed/partial-sidelying, repositioning feet and donning gripper socks. Pt visibly calmer post intervention, resting peacefully, almost asleep. Time missed due to pain. RN and MD informed.  Therapy Documentation Precautions:  Precautions Precautions: Fall Precaution Comments: Hypersentive and painful feet Restrictions Weight Bearing Restrictions: No  Pain: Pain Assessment Pain Scale: 0-10 Pain Score: 10-Worst pain ever Pain Location: Foot Pain Orientation: Left;Right Pain Intervention(s): Medication (See eMAR) ADL: ADL Eating: Independent Grooming: Modified independent Where Assessed-Grooming: Sitting at sink Upper Body Bathing: Setup Where Assessed-Upper Body Bathing: Sitting at sink Lower Body Bathing: Setup Where Assessed-Lower Body Bathing: Sitting at sink, Standing at sink Upper Body Dressing: Modified independent (Device) Where Assessed-Upper Body Dressing: Standing at sink Lower Body Dressing: Modified independent Where Assessed-Lower Body Dressing: Sitting at sink, Standing at sink Toileting: Modified independent Toilet Transfer: Contact guard (per evaluating PT) Toilet Transfer Method: Squat pivot Tub/Shower Transfer: Close  supervison Social research officer, government: Not assessed ADL Comments: Using clinical judgement, pt can perform BADLs from bedlevel at Mod I level    Therapy/Group: Individual Therapy  Temekia Caskey A Aubrielle Stroud 04/17/2021, 12:45 PM

## 2021-04-16 NOTE — Progress Notes (Signed)
Patient ID: Bruce Hunter, male   DOB: 08/04/1976, 45 y.o.   MRN: [id number] Spoke with [facility] CM to ask about getting PCS aide he reports need an order to begin the process, will get MD order and fax to him to being process of PCS. Will try to get home health through them also. Pt has ben approved until 8/19 with clinical update due then.

## 2021-04-17 ENCOUNTER — Inpatient Hospital Stay (HOSPITAL_COMMUNITY): Payer: Medicaid Other

## 2021-04-17 DIAGNOSIS — M79601 Pain in right arm: Secondary | ICD-10-CM

## 2021-04-17 DIAGNOSIS — R609 Edema, unspecified: Secondary | ICD-10-CM

## 2021-04-17 DIAGNOSIS — N179 Acute kidney failure, unspecified: Secondary | ICD-10-CM

## 2021-04-17 DIAGNOSIS — M792 Neuralgia and neuritis, unspecified: Secondary | ICD-10-CM

## 2021-04-17 DIAGNOSIS — M7989 Other specified soft tissue disorders: Secondary | ICD-10-CM

## 2021-04-17 DIAGNOSIS — R6510 Systemic inflammatory response syndrome (SIRS) of non-infectious origin without acute organ dysfunction: Secondary | ICD-10-CM | POA: Insufficient documentation

## 2021-04-17 LAB — CBC WITH DIFFERENTIAL/PLATELET
Abs Immature Granulocytes: 0.08 10*3/uL — ABNORMAL HIGH (ref 0.00–0.07)
Basophils Absolute: 0 10*3/uL (ref 0.0–0.1)
Basophils Relative: 0 %
Eosinophils Absolute: 0.2 10*3/uL (ref 0.0–0.5)
Eosinophils Relative: 2 %
HCT: 28.2 % — ABNORMAL LOW (ref 39.0–52.0)
Hemoglobin: 8.7 g/dL — ABNORMAL LOW (ref 13.0–17.0)
Immature Granulocytes: 1 %
Lymphocytes Relative: 9 %
Lymphs Abs: 1.2 10*3/uL (ref 0.7–4.0)
MCH: 30 pg (ref 26.0–34.0)
MCHC: 30.9 g/dL (ref 30.0–36.0)
MCV: 97.2 fL (ref 80.0–100.0)
Monocytes Absolute: 1.6 10*3/uL — ABNORMAL HIGH (ref 0.1–1.0)
Monocytes Relative: 12 %
Neutro Abs: 10.1 10*3/uL — ABNORMAL HIGH (ref 1.7–7.7)
Neutrophils Relative %: 76 %
Platelets: 209 10*3/uL (ref 150–400)
RBC: 2.9 MIL/uL — ABNORMAL LOW (ref 4.22–5.81)
RDW: 19.3 % — ABNORMAL HIGH (ref 11.5–15.5)
WBC: 13.1 10*3/uL — ABNORMAL HIGH (ref 4.0–10.5)
nRBC: 0 % (ref 0.0–0.2)

## 2021-04-17 LAB — RHEUMATOID FACTOR: Rheumatoid fact SerPl-aCnc: 12.6 IU/mL (ref <14.0)

## 2021-04-17 LAB — GLUCOSE, CAPILLARY
Glucose-Capillary: 147 mg/dL — ABNORMAL HIGH (ref 70–99)
Glucose-Capillary: 120 mg/dL — ABNORMAL HIGH (ref 70–99)
Glucose-Capillary: 190 mg/dL — ABNORMAL HIGH (ref 70–99)
Glucose-Capillary: 196 mg/dL — ABNORMAL HIGH (ref 70–99)

## 2021-04-17 LAB — ANTI-DNA ANTIBODY, DOUBLE-STRANDED: ds DNA Ab: <1 IU/mL (ref 0–9)

## 2021-04-17 LAB — BASIC METABOLIC PANEL
Anion gap: 11 (ref 5–15)
BUN: 49 mg/dL — ABNORMAL HIGH (ref 6–20)
CO2: 22 mmol/L (ref 22–32)
Calcium: 9.6 mg/dL (ref 8.9–10.3)
Chloride: 100 mmol/L (ref 98–111)
Creatinine, Ser: 1.85 mg/dL — ABNORMAL HIGH (ref 0.61–1.24)
GFR, Estimated: 45 mL/min — ABNORMAL LOW (ref >60)
Glucose, Bld: 126 mg/dL — ABNORMAL HIGH (ref 70–99)
Potassium: 4.1 mmol/L (ref 3.5–5.1)
Sodium: 133 mmol/L — ABNORMAL LOW (ref 135–145)

## 2021-04-17 MED ORDER — OXYCODONE HCL 5 MG PO TABS
15.0000 mg | ORAL_TABLET | ORAL | Status: DC | PRN
  Administered 2021-04-17 – 2021-04-26 (×49): 15 mg via ORAL
  Filled 2021-04-17 (×49): qty 3

## 2021-04-17 MED ORDER — PREDNISONE 20 MG PO TABS
60.0000 mg | ORAL_TABLET | Freq: Once | ORAL | Status: AC
  Administered 2021-04-17: 60 mg via ORAL
  Filled 2021-04-17: qty 3

## 2021-04-17 MED ORDER — GABAPENTIN 300 MG PO CAPS
600.0000 mg | ORAL_CAPSULE | Freq: Two times a day (BID) | ORAL | Status: DC
  Administered 2021-04-17 – 2021-04-29 (×24): 600 mg via ORAL
  Filled 2021-04-17 (×25): qty 2

## 2021-04-17 NOTE — Progress Notes (Signed)
   04/17/21 0920  Assess: MEWS Score  Temp (!) 101.2 F (38.4 C)  BP (!) 151/93  Pulse Rate (!) 117  Resp (!) 24  SpO2 100 %  O2 Device Room Air  Assess: MEWS Score  MEWS Temp 1  MEWS Systolic 0  MEWS Pulse 2  MEWS RR 1  MEWS LOC 0  MEWS Score 4  MEWS Score Color Red  Assess: if the MEWS score is Yellow or Red  Were vital signs taken at a resting state? Yes  Focused Assessment Change from prior assessment (see assessment flowsheet)  Does the patient meet 2 or more of the SIRS criteria? Yes  Does the patient have a confirmed or suspected source of infection? No  Provider and Rapid Response Notified? Yes  Early Detection of Sepsis Score *See Row Information* High  MEWS guidelines implemented *See Row Information* Yes  Take Vital Signs  Increase Vital Sign Frequency  Red: Q 1hr X 4 then Q 4hr X 4, if remains red, continue Q 4hrs  Escalate  MEWS: Escalate Red: discuss with charge nurse/RN and provider, consider discussing with RRT  Notify: Charge Nurse/RN  Name of Charge Nurse/RN Notified Dauda  Date Charge Nurse/RN Notified 04/17/21  Time Charge Nurse/RN Notified [id number]  Notify: Provider  Provider Name/Title MD Posey Pronto  Date Provider Notified 04/17/21  Time Provider Notified [phone]  Notification Type Face-to-face  Notification Reason Change in status  Provider response At bedside;See new orders  Date of Provider Response 04/17/21  Time of Provider Response 0935  Document  Patient Outcome Other (Comment) (continue POC)

## 2021-04-17 NOTE — Progress Notes (Signed)
PROGRESS NOTE   Subjective/Complaints: Patient seen laying in bed this morning, distressed.  He notes severe sensitivity to to feet.  He also notes swelling to right upper extremity.  Nurse reports fever overnight.  ROS: Denies CP, SOB, N/V/D  Objective:   [place] RT LOWER EXTREM LTD SOFT TISSUE NON VASCULAR  Result Date: 04/16/2021 CLINICAL DATA:  Edema and fluid EXAM: ULTRASOUND right LOWER EXTREMITY LIMITED TECHNIQUE: Ultrasound examination of the lower extremity soft tissues was performed in the area of clinical concern. COMPARISON:  MRI 04/14/2021 FINDINGS: Targeted sonography of the right foot performed. Marked subcutaneous edema within the right foot and ankle. Hypoechoic mass or fluid collection between the first and second digit measuring 1.8 x 1 x 1.5 cm and between the third and fourth digit measuring 1.4 x 0.9 x 1.1 cm IMPRESSION: Hypoechoic avascular masses versus complex fluid collections between the first and second digits and the third and fourth digits, possibly corresponding to MRI nodules, see previous report. Follow-up contrast enhanced exam could be considered. Electronically Signed   By: Donavan Foil M.D.   On: 04/16/2021 20:52   Recent Labs    04/16/21 0533 04/17/21 0612  WBC 13.3* 13.1*  HGB 8.5* 8.7*  HCT 27.4* 28.2*  PLT 205 209      Recent Labs    04/16/21 0533 04/17/21 0612  NA 133* 133*  K 4.3 4.1  CL 99 100  CO2 22 22  GLUCOSE 116* 126*  BUN 45* 49*  CREATININE 1.63* 1.85*  CALCIUM 9.4 9.6     Intake/Output Summary (Last 24 hours) at 04/17/2021 1149 Last data filed at 04/17/2021 0847 Gross per 24 hour  Intake 720 ml  Output 2300 ml  Net -1580 ml          Physical Exam: Vital Signs Blood pressure (!) 144/72, pulse (!) 128, temperature 99.9 F (37.7 C), resp. rate (!) 22, height '5\' 10"'$  (1.778 m), weight 84.1 kg, SpO2 100 %. Constitutional: Distressed. Vital signs reviewed. HENT:  Normocephalic.  Atraumatic. Eyes: EOMI. No discharge. Cardiovascular: No JVD.  + Tachycardia. Respiratory: Normal effort.  No stridor.  Bilateral clear to auscultation. GI: Non-distended.  BS +. Skin: Warm and dry.  Buttocks not examined today. Psych: Normal mood.  Normal behavior. Musc: Bilateral feet with edema and severe pain Right upper extremity edema Neuro: Alert Motor: Testing limited due to pain and distress Neuro: Alert   Assessment/Plan: 1. Functional deficits which require 3+ hours per day of interdisciplinary therapy in a comprehensive inpatient rehab setting. Physiatrist is providing close team supervision and 24 hour management of active medical problems listed below. Physiatrist and rehab team continue to assess barriers to discharge/monitor patient progress toward functional and medical goals  Care Tool:  Bathing    Body parts bathed by patient: Right arm, Left arm, Chest, Abdomen, Right upper leg, Left upper leg, Face, Front perineal area, Right lower leg, Left lower leg, Buttocks   Body parts bathed by helper: Buttocks, Right lower leg, Left lower leg     Bathing assist Assist Level: Independent with assistive device (bedlevel, clinical judgement)     Upper Body Dressing/Undressing Upper body dressing   What is the  patient wearing?: Pull over shirt    Upper body assist Assist Level: Independent with assistive device (bedlevel, clinical judgement)    Lower Body Dressing/Undressing Lower body dressing      What is the patient wearing?: Underwear/pull up, Pants     Lower body assist Assist for lower body dressing: Independent with assitive device (bedlevel, clinical judgement)     Toileting Toileting    Toileting assist Assist for toileting: Independent with assistive device (per PT)     Transfers Chair/bed transfer  Transfers assist     Chair/bed transfer assist level: Contact Guard/Touching assist Chair/bed transfer assistive device:  Programmer, multimedia   Ambulation assist   Ambulation activity did not occur: Refused  Assist level: Supervision/Verbal cueing Assistive device: Walker-rolling Max distance: 300 ft   Walk 10 feet activity   Assist  Walk 10 feet activity did not occur: Refused  Assist level: Independent with assistive device Assistive device: Walker-rolling   Walk 50 feet activity   Assist Walk 50 feet with 2 turns activity did not occur: Refused  Assist level: Independent with assistive device Assistive device: Walker-rolling    Walk 150 feet activity   Assist Walk 150 feet activity did not occur: Refused  Assist level: Supervision/Verbal cueing Assistive device: Walker-rolling    Walk 10 feet on uneven surface  activity   Assist Walk 10 feet on uneven surfaces activity did not occur: Safety/medical concerns   Assist level: Supervision/Verbal cueing Assistive device: Aeronautical engineer Will patient use wheelchair at discharge?: Yes Type of Wheelchair: Manual Wheelchair activity did not occur: Refused (Pt stated he would not leave his room)  Wheelchair assist level: Independent Max wheelchair distance: 150 ft    Wheelchair 50 feet with 2 turns activity    Assist    Wheelchair 50 feet with 2 turns activity did not occur: Refused   Assist Level: Independent   Wheelchair 150 feet activity     Assist  Wheelchair 150 feet activity did not occur: Refused   Assist Level: Independent   Blood pressure (!) 144/72, pulse (!) 128, temperature 99.9 F (37.7 C), resp. rate (!) 22, height '5\' 10"'$  (1.778 m), weight 84.1 kg, SpO2 100 %.  Medical Problem List and Plan: 1.  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  Impaired mobility, was ambulating >200 feet: d/c Lovenox  Patient doesn't want lovenox.  3. Left shoulder/back/foot  pain/Pain/diabetic peripheral neuropathy: continue Oxycodone prn. Increase steroids for gout flare. Will do 60 mg daily x 2 days, then taper. Pt rates pain as "11/10". Needs to bring in shoes so able to walk better; If steroids PO doesn't work, might need L shoulder injection. Pt hates needles.   8/1- added Lidoderm patches for Shoulder and back pain. 8pm to 8am  8/6 increase gabapentin to '300mg'$  TID, increase to 600 3 times daily on 8/13 Increase Doxy to 15 mg every 4 as needed  4. Mood: LCSW to follow for evaluation and support.              -antipsychotic agents: N/A 5. Neuropsych: This patient is capable of making decisions on his own behalf. 6. Skin/Wound Care: Routine pressure relief measures.  7. Fluids/Electrolytes/Nutrition: Strict I/Os 8. PJP PNA/leukocytosis:  Duration of meds discussed with pharmacy who relayed that patient has completed 21+ days of steroids and primaquine. --Clindamycin thorough 07/29 to complete 21 days.  ID consulted, appreciate recs-recommended VIR evaluation.   VIR are recommending foot ultrasound, order placed 9. Gout flare: Was on colchicine 0.6 mg daily PTA. --prednisone taper completed 8/9- colchicine dose x2 again to see if will help feet pain.  10. T2DM: Hgb A1c- 5.4. Was on glucotrol/metformin bid PTA.             --continue Lantus while on steroids with SSI for elevated BS.  Relatively controlled on 10/13 11. Anxiety d/o: Was on Klonopin TID PTA. --bid effective at this time. 12. HTN: Monitor BP tid--continue hydralazine and Norvasc.  Elevated on 10/13, likely due to pain 13. HIV: On Epivir and Tivicay 14. Anemia: H/H steadily improving.   Hemoglobin 8.7 on 8/13 15. Hypomagnesemia: MgOx 400 mg BID  Magnesium 1.7 on 8/9 16. SIRS: Monitor for fevers and other signs of infection.   WBCs 13.1 on 8/13  Now with tachycardia and fevers-fevers likely masked by frequent Tylenol  Await further ID recs  17. Dysphagia: Resolved 18. Urinary  retention-resolved 19. Bowel incontinence- Improved 20. Chipped nails-?  Improving.  21. GERD  87/28-restarted Protonix- also has tums prn. 22. Pressure injury to buttocks: offload q55mnutes 23. Bilateral lower extremity swelling: ice, elevated, compression garments 24. Shoulder pain: continue lidocaine patch and voltaren gel.  25. Nearsighted- schedule outpatient ophthalmology eval- should be checked for diabetic retinopathy 26. Diabetic peripheral neuropathy: will benefit from outpatient Qutenza. Ordered diabetic shoes  -gabapentin increased as above  Mildly elevated on 8/13 27. Lower extremity edema:resumed '10mg'$  daily Lasix.  28. AKI:   Creatinine 1.85 on 8/13, exacerbated by diuretics 29. Leukocytosis  8/11- based on R foot MRI, has myofasciitis of R foot- called Ortho and also called ID due to rising leukocytosis  WBCs 13.1 on 8/13  Continue to monitor Appreciate ID recs-ordered Rh factor and quantiferon TB testing- also GC swabs  LOS: 18 days A FACE TO FACE EVALUATION WAS PERFORMED  Delonna Ney ALorie Phenix8/13/2022, 11:49 AM

## 2021-04-17 NOTE — Progress Notes (Signed)
Physical Therapy Session Note  Patient Details  Name: Bruce Hunter MRN: [id number] Date of Birth: 06-22-1976  Today's Date: 04/17/2021 PT Individual Time: 8251-8984 PT Individual Time Calculation (min): 45 min   Short Term Goals: Week 1:  PT Short Term Goal 1 (Week 1): =LTGs d/t ELOS PT Short Term Goal 1 - Progress (Week 1): Met PT Short Term Goal 2 (Week 1): Pt will tolerate x 30 minutes of activity PT Short Term Goal 2 - Progress (Week 1): Met PT Short Term Goal 3 (Week 1): Pt will tolerate sitting OOB for 30 + minutes PT Short Term Goal 3 - Progress (Week 1): Met  Skilled Therapeutic Interventions/Progress Updates:    Pt greeted in bed, agreeable to therapy. Reported increased pain in his feet and R hand (endorses IV as reason for hand pain) Pain addressed with positioning throughout and ice on BIL feet at end of session.  Session focused on transfer training d/t anticipated d/c at w/c level. Squat pivot bed>w/c<>mat table with supervision and max v/c for technique and safety. Pt resistant to instruction throughout session. Limited by pain in feet and R hand. Therapist adjusted pt's w/c and pt remained in personal chair after session.  Pt propelled w/c with BUE to/from therapy gym, ~100 ft mod I including management of w/c parts. Pt returned to room after session and remained seated in w/c. Pt was left with all needs in reach and alarm active.   Therapy Documentation Precautions:  Precautions Precautions: Fall Precaution Comments: Hypersentive and painful feet Restrictions Weight Bearing Restrictions: No    Therapy/Group: Individual Therapy  Mickel Fuchs 04/17/2021, 9:19 AM

## 2021-04-17 NOTE — Progress Notes (Signed)
[facility]  Date of Admission:  03/30/2021     CC: bilateral feet swelling/leukocytosis                                      Abx: 7/29-c dapsone  Clindamycin/primaquine by 7/29 for pjp                                                       Assessment/plan: 45 y.o. male with HIV/AIDS successfully suppressed on Dovato after initial period of imperfect adherence, admitted for sepsis and respiratory failure treated empirically for pjp pna with clindamycin/primaquin in setting aki. Patient also finished tx for VAP (mssa/mrse). Patient improved from respiratory standpoint. ID reconsulted for bilateral feet swelling/pain       #hiv/aids #pjp pna Recent Labs       Lab Results  Component Value Date    HIV1RNAQUANT 240 03/16/2021    His cd4 was 35 on 6/13, but increased to 324 on 7/12 He finished his PJP treatment on 7/28  Would continue secondary prophylaxis for 3 months with cd4 >200 being the goal for the 3 months. On dapsone at this time  He is being maintained on Dovato equivalent (tivicay and lamivudine) here     #AKI Previously requires dialysis here but recovered   #4 bilateral foot pain/swelling, bilateral shoulder pain Suspected gout with patient reported history of the same, but never confirmed with any sort of crystal analysis.  Based on prior notes it appears his swelling/pain worsened just at the end of his prednisone taper.  MRI showed mainly periarticular structure inflammation and nodules/mass.  Question tophi in the right setting.  Other less likely differentials considered were disseminated gonococcal infection, reactive process, rheumatologic process.  Further rheumatologic work-up is currently pending, however, rheumatoid factor is negative.  Oral and rectal gonorrhea/chlamydia is negative.  Patient was seen by radiology yesterday who wanted to obtain an ultrasound of the right foot for further evaluation of potential area to  target for aspiration or biopsy.  Ultrasound has shown hypoechoic avascular masses versus complex fluid collections between the first and second digits in the third and fourth digits, possibly corresponding to the MRI nodules.  Further evaluation is currently pending.  He was febrile overnight with a T-max 101.2 degrees Fahrenheit with stable leukocytosis of 13.1.    Plan: -Follow-up pending urine GC/CT -Await IR input regarding ultrasound findings to determine if he is amenable to biopsy or aspiration for crystal analysis as well as sending fluid for cell count and cultures -Pending ANA, CCP, double-stranded DNA, repeat QuantiFERON -Continue dapsone for PCP prophylaxis -Continue Tivicay, lamivudine (equivalent of Dovato) -Would favor to empirically treat for gout flare -Patient has ID follow-up with Dr. Tommy Medal on 8/18 at 1:45 PM      Principal Problem:   Intensive care (ICU) myopathy Active Problems:   AIDS (acquired immune deficiency syndrome) (HCC)   Debility   Gout attack   Leukocytosis   Bilateral swelling of feet   Allergies  Allergen Reactions   Lactose Intolerance (Gi)     Scheduled Meds:  (feeding supplement) PROSource Plus  30 mL Oral TID BM   clonazePAM  0.5 mg Oral TID  dapsone  100 mg Oral Daily   diclofenac Sodium  2 g Topical QID   dolutegravir  50 mg Oral Daily   gabapentin  300 mg Oral Q12H   hydrALAZINE  25 mg Oral Q8H   insulin aspart  0-15 Units Subcutaneous TID WC   insulin aspart  0-5 Units Subcutaneous QHS   insulin glargine-yfgn  5 Units Subcutaneous Daily   lamiVUDine  300 mg Oral Daily   lidocaine  1 patch Transdermal Q24H   lidocaine  2 patch Transdermal Q24H   magnesium oxide  400 mg Oral BID   metoprolol tartrate  25 mg Oral BID   multivitamin with minerals  1 tablet Oral Daily   pantoprazole  40 mg Oral Daily   tamsulosin  0.4 mg Oral QPC supper   Continuous Infusions: PRN Meds:.acetaminophen, albuterol, alum & mag hydroxide-simeth,  bisacodyl, calcium carbonate, diphenhydrAMINE, guaiFENesin-dextromethorphan, lip balm, oxyCODONE, polyethylene glycol, prochlorperazine **OR** prochlorperazine **OR** prochlorperazine, traZODone   SUBJECTIVE: Patient reports significant pain and swelling in his feet.  He also now has pain in his shoulders.  Does not seem to have any other significant joint pain.  He had a fever overnight.  He is frustrated by his pain and is asking for something for relief.  Review of Systems: ROS All other ROS was negative, except mentioned above     OBJECTIVE: Vitals:   04/16/21 2208 04/17/21 0501 04/17/21 0920 04/17/21 1026  BP: 133/84 134/76 (!) 151/93 (!) 144/72  Pulse:  (!) 108 (!) 117 (!) 128  Resp:  16 (!) 24 (!) 22  Temp:  99.3 F (37.4 C) (!) 101.2 F (38.4 C) 99.9 F (37.7 C)  TempSrc:  Oral Oral   SpO2:  99% 100% 100%  Weight:  84.1 kg    Height:       Body mass index is 26.6 kg/m.  Physical Exam Constitutional:      Comments: Lying in bed on his left side, no acute distress.  HENT:     Head: Normocephalic and atraumatic.  Eyes:     Extraocular Movements: Extraocular movements intact.     Conjunctiva/sclera: Conjunctivae normal.  Cardiovascular:     Rate and Rhythm: Regular rhythm. Tachycardia present.  Pulmonary:     Effort: Pulmonary effort is normal. No respiratory distress.     Breath sounds: Normal breath sounds.  Abdominal:     General: There is no distension.     Palpations: Abdomen is soft.     Tenderness: There is no abdominal tenderness.  Musculoskeletal:     Comments: Bilateral feet are tender and swollen without erythema.  Bilateral shoulders are also tender without significant swelling or erythema.  Skin:    Findings: No rash.  Neurological:     General: No focal deficit present.     Mental Status: He is oriented to person, place, and time.  Psychiatric:        Mood and Affect: Mood normal.        Behavior: Behavior normal.      Lab Results Lab  Results  Component Value Date   WBC 13.1 (H) 04/17/2021   HGB 8.7 (L) 04/17/2021   HCT 28.2 (L) 04/17/2021   MCV 97.2 04/17/2021   PLT 209 04/17/2021    Lab Results  Component Value Date   CREATININE 1.85 (H) 04/17/2021   BUN 49 (H) 04/17/2021   NA 133 (L) 04/17/2021   K 4.1 04/17/2021   CL 100 04/17/2021   CO2 22 04/17/2021  Lab Results  Component Value Date   ALT 9 04/01/2021   AST 17 04/01/2021   ALKPHOS 64 04/01/2021   BILITOT 0.2 (L) 04/01/2021      Microbiology: Recent Results (from the past 240 hour(s))  Urine Culture     Status: Abnormal   Collection Time: 04/13/21  8:05 AM   Specimen: Urine, Clean Catch  Result Value Ref Range Status   Specimen Description URINE, CLEAN CATCH  Final   Special Requests NONE  Final   Culture (A)  Final    <10,000 COLONIES/mL INSIGNIFICANT GROWTH Performed at [facility], [address], [place], [place] [address]    Report Status 04/14/2021 FINAL  Final  Culture, blood (routine x 2)     Status: None (Preliminary result)   Collection Time: 04/14/21 11:19 AM   Specimen: BLOOD  Result Value Ref Range Status   Specimen Description BLOOD RIGHT ANTECUBITAL  Final   Special Requests   Final    BOTTLES DRAWN AEROBIC AND ANAEROBIC Blood Culture adequate volume   Culture   Final    NO GROWTH 3 DAYS Performed at [facility], [place] [address] [place], [place] [address]    Report Status PENDING  Incomplete  Culture, blood (routine x 2)     Status: None (Preliminary result)   Collection Time: 04/14/21 11:36 AM   Specimen: BLOOD LEFT FOREARM  Result Value Ref Range Status   Specimen Description BLOOD LEFT FOREARM  Final   Special Requests   Final    BOTTLES DRAWN AEROBIC AND ANAEROBIC Blood Culture adequate volume   Culture   Final    NO GROWTH 3 DAYS Performed at [facility], [place] [address], [place], [place] [address]    Report Status PENDING  Incomplete        Mignon Pine, MD  04/17/2021, 11:56  AM  I spent greater than 35 minutes with the patient including greater than 50% of time in face to face counsel of the patient and in coordination of their care.

## 2021-04-17 NOTE — Progress Notes (Signed)
VASCULAR LAB    Right upper extremity venous duplex has been performed.  See CV proc for preliminary results.   Jeff Frieden, RVT 04/17/2021, 5:05 PM

## 2021-04-17 NOTE — Progress Notes (Signed)
Spoke with Mitzi Hansen with ID. Updated on pt condition. Plans to come see pt. MD Posey Pronto informed.  Sheela Stack, LPN

## 2021-04-17 NOTE — Progress Notes (Signed)
Occupational Therapy Session Note  Patient Details  Name: Bruce Hunter MRN: [id number] Date of Birth: 03-20-1976  Today's Date: 04/18/2021 OT Individual Time: 1003-1047 OT Individual Time Calculation (min): 44 min   Skilled Therapeutic Interventions/Progress Updates:    Pt greeted in the w/c, affect bright, reporting that pain was manageable this AM. He had already set himself up to wash at the sink. Lateral scoot transfer<bed completed with OT stabilizing (moving) equipment. Pt placed a towel over the cushion of his w/c and set up lateral scoot transfer back to the w/c with the same assistance. Pt reports the lateral scoot transfers have been very helpful during [name] transfers, noted BSC placed at bedside. He engaged in bathing/dressing tasks at sit<stand level with Mod I, pt locking w/c brakes at appropriate times and wearing his gripper socks. Pt reports that his home is w/c accessible, about to utilize lateral scoots as necessary. He was already ordered a 3:1 from CIR, we discussed that he will need a drop arm BSC for home use due to his change in functional status. Notified SW via Arts administrator. Pt aware that he will need to independently purchase a drop arm BSC if SW is unable to provide him with another commode prior to d/c. Pt remained in the w/c at close of session, all needs within reach and safety belt fastened.   Therapy Documentation Precautions:  Precautions Precautions: Fall Precaution Comments: Hypersentive and painful feet Restrictions Weight Bearing Restrictions: No Vital Signs: Therapy Vitals Temp: 97.7 F (36.5 C) Pulse Rate: 85 Resp: 18 BP: 129/81 Oxygen Therapy SpO2: 99 % O2 Device: Room Air Pain: Pain Assessment Pain Scale: 0-10 Pain Score: Asleep Pain Type: Acute pain Pain Location: Foot Pain Orientation: Right;Left Pain Intervention(s): Medication (See eMAR) ADL: ADL Eating: Independent Grooming: Modified independent Where Assessed-Grooming:  Sitting at sink Upper Body Bathing: Setup Where Assessed-Upper Body Bathing: Sitting at sink Lower Body Bathing: Setup Where Assessed-Lower Body Bathing: Sitting at sink, Standing at sink Upper Body Dressing: Modified independent (Device) Where Assessed-Upper Body Dressing: Standing at sink Lower Body Dressing: Modified independent Where Assessed-Lower Body Dressing: Sitting at sink, Standing at sink Toileting: Modified independent Toilet Transfer: Contact guard (per evaluating PT) Toilet Transfer Method: Squat pivot Tub/Shower Transfer: Close supervison Social research officer, government: Not assessed ADL Comments: Using clinical judgement, pt can perform BADLs from bedlevel at Mod I level     Therapy/Group: Individual Therapy  Jeaninne Lodico A Icie Kuznicki 04/18/2021, 12:33 PM

## 2021-04-17 NOTE — Progress Notes (Signed)
Nephrology Progress Note:   Subjective: He had 1.9 liters UOP over 8/12.  His feet are swollen but feel better than yesterday.  He's using ice  Review of systems:   Denies shortness of breath  Decreased mobility 2/2 foot pain Denies n/v Denies cp   Objective Vital signs in last 24 hours: Vitals:   04/17/21 0920 04/17/21 1026 04/17/21 1215 04/17/21 1351  BP: (!) 151/93 (!) 144/72 130/81 (!) 142/84  Pulse: (!) 117 (!) 128 100 100  Resp: (!) 24 (!) 22 18 (!) 22  Temp: (!) 101.2 F (38.4 C) 99.9 F (37.7 C) (!) 102.4 F (39.1 C) (!) 100.5 F (38.1 C)  TempSrc: Oral     SpO2: 100% 100% 96% 100%  Weight:      Height:       Weight change: -1.9 kg  Intake/Output Summary (Last 24 hours) at 04/17/2021 1518 Last data filed at 04/17/2021 1325 Gross per 24 hour  Intake 720 ml  Output 2750 ml  Net -2030 ml    Assessment/ Plan: Pt is a 45 y.o. yo male with HIV/AIDS, DM, baseline CKD who was admitted on 03/30/2021 with acute hypoxic resp failure with sepsis and AKI.  New diagnosis HIV and with PJP PNA during acute hospitalization now s/p course of abx for same   Assessment/Plan: 1. Hx AKI  AKI in the setting of hypoxic resp failure, ATN with septic shock and also urinary retention.  Off and on CRRT-  most recently stopped on 7/15 and temp HD catheter out.  Note 02/2020 Cr 2.80 and 05/2011 Cr 0.90 - only prior labs in [facility] system.  reported baseline crt in the high 2's however this was based on limited data and may have just been reflective of prior AKI episode.   - Reassess lasix tomorrow - defer today   - strict ins/outs ordered - daily weights ordered  2. Chronic hypoxic resp failure s/p trach - now decannulated  3. HIV - regimen per primary team/ID  4. HTN - controlled. Recent addition of metoprolol. Stopped amlodipine in the event is contributing to swelling  5. Anemia normocytic - no acute need for PRBC's or ESA.  Iron deficiency. IV iron x 2 doses ordered  6. Impaired mobility  - per CIR team  7.  Urinary retention-  continue flomax and note foley is removed  8. Hypomagnesemia  - repleted; mag in am  Disposition - in rehab and unable to ambulate.  Disposition per rehab team    Labs: Basic Metabolic Panel: Recent Labs  Lab 04/15/21 0541 04/16/21 0533 04/17/21 0612  NA 134* 133* 133*  K 4.2 4.3 4.1  CL 95* 99 100  CO2 '25 22 22  '$ GLUCOSE 98 116* 126*  BUN 43* 45* 49*  CREATININE 1.74* 1.63* 1.85*  CALCIUM 9.6 9.4 9.6   Liver Function Tests: Recent Labs  Lab 04/12/21 0644  ALBUMIN 2.8*    CBC: Recent Labs  Lab 04/13/21 0502 04/13/21 0502 04/14/21 0541 04/15/21 0541 04/16/21 0533 04/17/21 0612  WBC 14.7*  --  16.4* 17.3* 13.3* 13.1*  NEUTROABS  --    < > 12.7* 13.6* 10.1* 10.1*  HGB 7.9*  --  8.5* 9.0* 8.5* 8.7*  HCT 25.4*  --  26.9* 28.4* 27.4* 28.2*  MCV 95.8  --  95.7 94.7 95.8 97.2  PLT 191  --  194 189 205 209   < > = values in this interval not displayed.    Studies/Results: [place] RT LOWER EXTREM LTD  SOFT TISSUE NON VASCULAR  Result Date: 04/16/2021 CLINICAL DATA:  Edema and fluid EXAM: ULTRASOUND right LOWER EXTREMITY LIMITED TECHNIQUE: Ultrasound examination of the lower extremity soft tissues was performed in the area of clinical concern. COMPARISON:  MRI 04/14/2021 FINDINGS: Targeted sonography of the right foot performed. Marked subcutaneous edema within the right foot and ankle. Hypoechoic mass or fluid collection between the first and second digit measuring 1.8 x 1 x 1.5 cm and between the third and fourth digit measuring 1.4 x 0.9 x 1.1 cm IMPRESSION: Hypoechoic avascular masses versus complex fluid collections between the first and second digits and the third and fourth digits, possibly corresponding to MRI nodules, see previous report. Follow-up contrast enhanced exam could be considered. Electronically Signed   By: Donavan Foil M.D.   On: 04/16/2021 20:52   Medications: Infusions:    Scheduled Medications:  (feeding  supplement) PROSource Plus  30 mL Oral TID BM   clonazePAM  0.5 mg Oral TID   dapsone  100 mg Oral Daily   diclofenac Sodium  2 g Topical QID   dolutegravir  50 mg Oral Daily   gabapentin  600 mg Oral Q12H   hydrALAZINE  25 mg Oral Q8H   insulin aspart  0-15 Units Subcutaneous TID WC   insulin aspart  0-5 Units Subcutaneous QHS   insulin glargine-yfgn  5 Units Subcutaneous Daily   lamiVUDine  300 mg Oral Daily   lidocaine  1 patch Transdermal Q24H   lidocaine  2 patch Transdermal Q24H   magnesium oxide  400 mg Oral BID   metoprolol tartrate  25 mg Oral BID   multivitamin with minerals  1 tablet Oral Daily   pantoprazole  40 mg Oral Daily   predniSONE  60 mg Oral Once   tamsulosin  0.4 mg Oral QPC supper    have reviewed scheduled and prn medications.  Physical Exam:  General adult male in bed in no acute distress HEENT normocephalic atraumatic extraocular movements intact sclera anicteric Neck supple trachea midline; bandage over old trach site Lungs clear to auscultation bilaterally normal work of breathing at rest on room air Heart S1S2 no rub Abdomen soft nontender nondistended Extremities 1+ edema of feet; tender; feet are wrapped. No edema of legs Psych normal mood and affect Neuro - awake and conversant; follows commands and provides hx   Claudia Desanctis, MD 04/17/2021, 3:44 PM

## 2021-04-17 NOTE — Progress Notes (Signed)
Handoff given to [employer]. Nurse updated on pt condition.  Sheela Stack, LPN

## 2021-04-18 ENCOUNTER — Inpatient Hospital Stay (HOSPITAL_COMMUNITY): Payer: Medicaid Other

## 2021-04-18 DIAGNOSIS — E871 Hypo-osmolality and hyponatremia: Secondary | ICD-10-CM

## 2021-04-18 LAB — GLUCOSE, CAPILLARY
Glucose-Capillary: 117 mg/dL — ABNORMAL HIGH (ref 70–99)
Glucose-Capillary: 142 mg/dL — ABNORMAL HIGH (ref 70–99)
Glucose-Capillary: 98 mg/dL (ref 70–99)
Glucose-Capillary: 123 mg/dL — ABNORMAL HIGH (ref 70–99)

## 2021-04-18 LAB — RENAL FUNCTION PANEL
Albumin: 2.9 g/dL — ABNORMAL LOW (ref 3.5–5.0)
Anion gap: 12 (ref 5–15)
BUN: 47 mg/dL — ABNORMAL HIGH (ref 6–20)
CO2: 21 mmol/L — ABNORMAL LOW (ref 22–32)
Calcium: 9.9 mg/dL (ref 8.9–10.3)
Chloride: 101 mmol/L (ref 98–111)
Creatinine, Ser: 1.54 mg/dL — ABNORMAL HIGH (ref 0.61–1.24)
GFR, Estimated: 57 mL/min — ABNORMAL LOW (ref >60)
Glucose, Bld: 124 mg/dL — ABNORMAL HIGH (ref 70–99)
Phosphorus: 4.7 mg/dL — ABNORMAL HIGH (ref 2.5–4.6)
Potassium: 4.3 mmol/L (ref 3.5–5.1)
Sodium: 134 mmol/L — ABNORMAL LOW (ref 135–145)

## 2021-04-18 LAB — MAGNESIUM: Magnesium: 2 mg/dL (ref 1.7–2.4)

## 2021-04-18 IMAGING — DX DG CHEST 1V PORT
1 series · 1 of 1 positions shown · non-contrast
Comparison: [DATE]

CLINICAL DATA: Fevers

EXAM:
PORTABLE CHEST 1 VIEW

[chest]
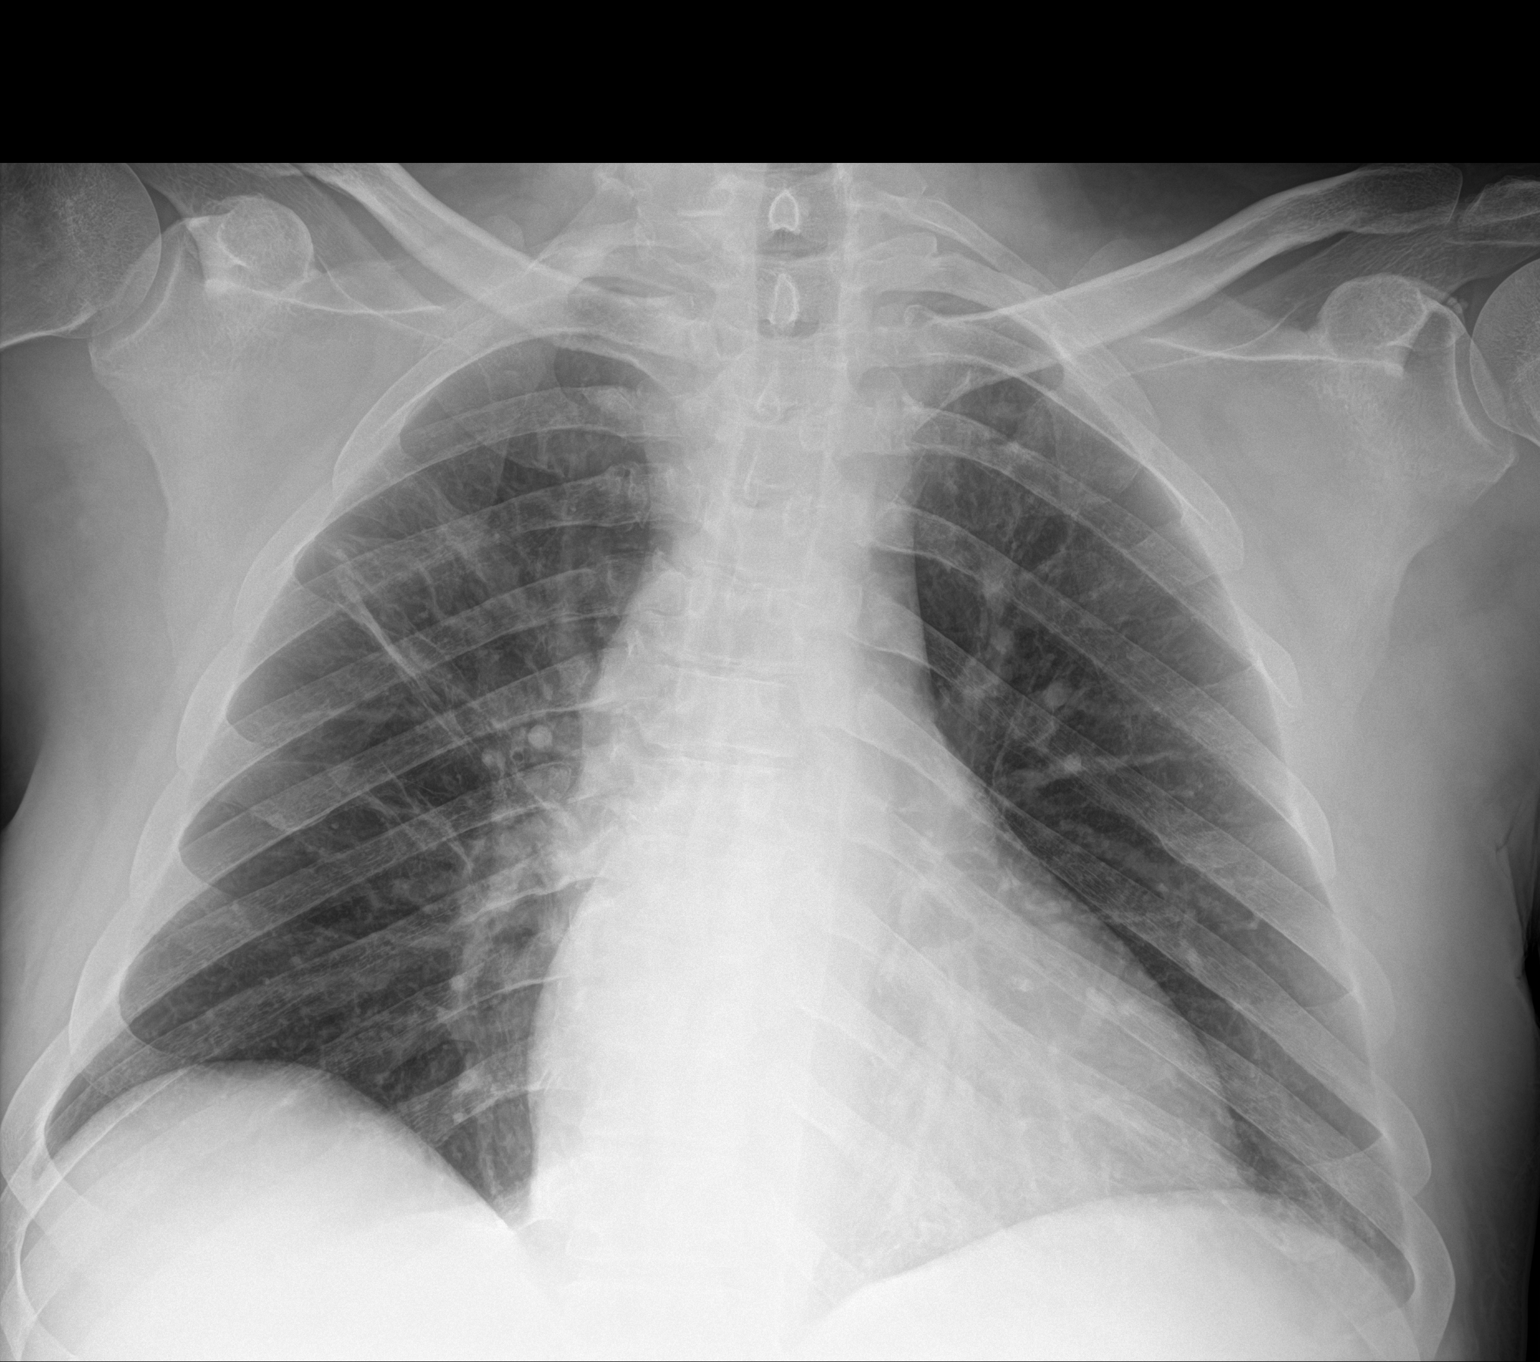

[1 of 1 positions shown; findings below may reference images not displayed]

FINDINGS: Cardiac shadow is stable. Scarring is noted in the lungs bilaterally
stable from the prior study. No focal infiltrate or sizable effusion
is noted. No bony abnormality is seen.
IMPRESSION: No active disease.

## 2021-04-18 MED ORDER — SODIUM CHLORIDE 0.9 % IV SOLN
3.0000 g | Freq: Three times a day (TID) | INTRAVENOUS | Status: DC
  Administered 2021-04-19 (×4): 3 g via INTRAVENOUS
  Filled 2021-04-18 (×6): qty 8

## 2021-04-18 MED ORDER — FUROSEMIDE 40 MG PO TABS
40.0000 mg | ORAL_TABLET | ORAL | Status: DC
  Administered 2021-04-19 – 2021-05-03 (×9): 40 mg via ORAL
  Filled 2021-04-18 (×9): qty 1

## 2021-04-18 MED ORDER — VANCOMYCIN HCL 2000 MG/400ML IV SOLN
2000.0000 mg | INTRAVENOUS | Status: DC
  Administered 2021-04-19 – 2021-04-20 (×2): 2000 mg via INTRAVENOUS
  Filled 2021-04-18 (×2): qty 400

## 2021-04-18 MED ORDER — PREDNISONE 20 MG PO TABS
60.0000 mg | ORAL_TABLET | Freq: Once | ORAL | Status: AC
  Administered 2021-04-18: 60 mg via ORAL
  Filled 2021-04-18: qty 3

## 2021-04-18 NOTE — Progress Notes (Signed)
Pharmacy Antibiotic Note  Bruce Hunter is a 45 y.o. male admitted to [facility] rehab unit on 03/30/2021. He was admitted to [facility] on 6/30 with acute respiratory failure.  Pharmacy has been consulted for Vancomycin/Unasyn dosing. Pt is spiking a fever tonight up to 102.9. WBC mildly elevated. Scr is elevated but fairly stable.   Plan: Vancomycin 2000 mg IV q24h >>Estimated AUC: 549 Unasyn 3g IV q8h Trend WBC, temp, renal function  F/U infectious work-up Drug levels as indicated   Height: '5\' 10"'$  (177.8 cm) Weight: 88.7 kg (195 lb 8.8 oz) IBW/kg (Calculated) : 73  Temp (24hrs), Avg:100.1 F (37.8 C), Min:97.7 F (36.5 C), Max:102.9 F (39.4 C)  Recent Labs  Lab 04/13/21 0502 04/14/21 0541 04/15/21 0541 04/16/21 0533 04/17/21 0612 04/18/21 0629  WBC 14.7* 16.4* 17.3* 13.3* 13.1*  --   CREATININE 1.44* 1.35* 1.74* 1.63* 1.85* 1.54*    Estimated Creatinine Clearance: 68.7 mL/min (A) (by C-G formula based on SCr of 1.54 mg/dL (H)).    Allergies  Allergen Reactions   Lactose Intolerance (Gi)    Narda Bonds, PharmD, BCPS Clinical Pharmacist Phone: [phone]

## 2021-04-18 NOTE — Progress Notes (Signed)
PROGRESS NOTE   Subjective/Complaints: Patient seen sitting up at the edge of his bed eating breakfast this morning.  He states he slept well overnight.  Good sitting balance noted.  He states he feels much better today after receiving medications yesterday.  He was seen by ID yesterday, notes reviewed-suspecting gout.  He was seen by nephrology yesterday, notes reviewed-assessing need for diuretics.  ROS: Denies CP, SOB, N/V/D  Objective:   [place] RT LOWER EXTREM LTD SOFT TISSUE NON VASCULAR  Result Date: 04/16/2021 CLINICAL DATA:  Edema and fluid EXAM: ULTRASOUND right LOWER EXTREMITY LIMITED TECHNIQUE: Ultrasound examination of the lower extremity soft tissues was performed in the area of clinical concern. COMPARISON:  MRI 04/14/2021 FINDINGS: Targeted sonography of the right foot performed. Marked subcutaneous edema within the right foot and ankle. Hypoechoic mass or fluid collection between the first and second digit measuring 1.8 x 1 x 1.5 cm and between the third and fourth digit measuring 1.4 x 0.9 x 1.1 cm IMPRESSION: Hypoechoic avascular masses versus complex fluid collections between the first and second digits and the third and fourth digits, possibly corresponding to MRI nodules, see previous report. Follow-up contrast enhanced exam could be considered. Electronically Signed   By: Donavan Foil M.D.   On: 04/16/2021 20:52   VAS [place] UPPER EXTREMITY VENOUS DUPLEX  Result Date: 04/17/2021 UPPER VENOUS STUDY  Patient Name:  Bruce Hunter  Date of Exam:   04/17/2021 Medical Rec #: [id number]       Accession #:    [id number] Date of Birth: 1976-02-27       Patient Gender: M Patient Age:   45 years Exam Location:  [facility] Procedure:      VAS [place] UPPER EXTREMITY VENOUS DUPLEX Referring Phys: Thelma Comp Marivel Mcclarty --------------------------------------------------------------------------------  Indications: Pain, and Edema Risk Factors: Gout.  Comparison Study: No prior right UEV on file Performing Technologist: Sharion Dove RVS  Examination Guidelines: A complete evaluation includes B-mode imaging, spectral Doppler, color Doppler, and power Doppler as needed of all accessible portions of each vessel. Bilateral testing is considered an integral part of a complete examination. Limited examinations for reoccurring indications may be performed as noted.  Right Findings: +----------+------------+---------+-----------+----------+---------------+ RIGHT     CompressiblePhasicitySpontaneousProperties    Summary     +----------+------------+---------+-----------+----------+---------------+ IJV           Full       Yes       Yes                              +----------+------------+---------+-----------+----------+---------------+ Subclavian               Yes       Yes                              +----------+------------+---------+-----------+----------+---------------+ Axillary                 Yes       Yes                              +----------+------------+---------+-----------+----------+---------------+  Brachial      Full       Yes       Yes                              +----------+------------+---------+-----------+----------+---------------+ Radial        Full                                                  +----------+------------+---------+-----------+----------+---------------+ Ulnar                                               patent by color +----------+------------+---------+-----------+----------+---------------+ Cephalic      Full                                                  +----------+------------+---------+-----------+----------+---------------+ Basilic       Full                                                  +----------+------------+---------+-----------+----------+---------------+  Left Findings: +----------+------------+---------+-----------+----------+-------+ LEFT       CompressiblePhasicitySpontaneousPropertiesSummary +----------+------------+---------+-----------+----------+-------+ Subclavian               Yes       Yes                      +----------+------------+---------+-----------+----------+-------+  Summary:  Right: No evidence of deep vein thrombosis in the upper extremity. No evidence of superficial vein thrombosis in the upper extremity.  Left: No evidence of thrombosis in the subclavian.  *See table(s) above for measurements and observations.     Preliminary    Recent Labs    04/16/21 0533 04/17/21 0612  WBC 13.3* 13.1*  HGB 8.5* 8.7*  HCT 27.4* 28.2*  PLT 205 209      Recent Labs    04/17/21 0612 04/18/21 0629  NA 133* 134*  K 4.1 4.3  CL 100 101  CO2 22 21*  GLUCOSE 126* 124*  BUN 49* 47*  CREATININE 1.85* 1.54*  CALCIUM 9.6 9.9     Intake/Output Summary (Last 24 hours) at 04/18/2021 0831 Last data filed at 04/18/2021 0745 Gross per 24 hour  Intake 1020 ml  Output 2425 ml  Net -1405 ml          Physical Exam: Vital Signs Blood pressure 133/89, pulse 83, temperature 98.6 F (37 C), temperature source Oral, resp. rate 18, height '5\' 10"'$  (1.778 m), weight 88.7 kg, SpO2 100 %. Constitutional: No distress . Vital signs reviewed. HENT: Normocephalic.  Atraumatic. Eyes: EOMI. No discharge. Cardiovascular: No JVD.  RRR. Respiratory: Normal effort.  No stridor.  Bilateral clear to auscultation. GI: Non-distended.  BS +. Skin: Warm and dry.  Buttock not examined today. Psych: Normal mood.  Normal behavior. Musc: No edema in extremities.  No tenderness in extremities. Neuro: Alert Motor: Grossly intact, some limitations due to pain in  bilateral feet  Assessment/Plan: 1. Functional deficits which require 3+ hours per day of interdisciplinary therapy in a comprehensive inpatient rehab setting. Physiatrist is providing close team supervision and 24 hour management of active medical problems listed  below. Physiatrist and rehab team continue to assess barriers to discharge/monitor patient progress toward functional and medical goals  Care Tool:  Bathing    Body parts bathed by patient: Right arm, Left arm, Chest, Abdomen, Right upper leg, Left upper leg, Face, Front perineal area, Right lower leg, Left lower leg, Buttocks   Body parts bathed by helper: Buttocks, Right lower leg, Left lower leg     Bathing assist Assist Level: Independent with assistive device (bedlevel, clinical judgement)     Upper Body Dressing/Undressing Upper body dressing   What is the patient wearing?: Pull over shirt    Upper body assist Assist Level: Independent with assistive device (bedlevel, clinical judgement)    Lower Body Dressing/Undressing Lower body dressing      What is the patient wearing?: Underwear/pull up, Pants     Lower body assist Assist for lower body dressing: Independent with assitive device (bedlevel, clinical judgement)     Toileting Toileting    Toileting assist Assist for toileting: Independent with assistive device (per PT)     Transfers Chair/bed transfer  Transfers assist     Chair/bed transfer assist level: Contact Guard/Touching assist Chair/bed transfer assistive device: Programmer, multimedia   Ambulation assist   Ambulation activity did not occur: Refused  Assist level: Supervision/Verbal cueing Assistive device: Walker-rolling Max distance: 300 ft   Walk 10 feet activity   Assist  Walk 10 feet activity did not occur: Refused  Assist level: Independent with assistive device Assistive device: Walker-rolling   Walk 50 feet activity   Assist Walk 50 feet with 2 turns activity did not occur: Refused  Assist level: Independent with assistive device Assistive device: Walker-rolling    Walk 150 feet activity   Assist Walk 150 feet activity did not occur: Refused  Assist level: Supervision/Verbal cueing Assistive device:  Walker-rolling    Walk 10 feet on uneven surface  activity   Assist Walk 10 feet on uneven surfaces activity did not occur: Safety/medical concerns   Assist level: Supervision/Verbal cueing Assistive device: Aeronautical engineer Will patient use wheelchair at discharge?: Yes Type of Wheelchair: Manual Wheelchair activity did not occur: Refused (Pt stated he would not leave his room)  Wheelchair assist level: Independent Max wheelchair distance: 150 ft    Wheelchair 50 feet with 2 turns activity    Assist    Wheelchair 50 feet with 2 turns activity did not occur: Refused   Assist Level: Independent   Wheelchair 150 feet activity     Assist  Wheelchair 150 feet activity did not occur: Refused   Assist Level: Independent   Blood pressure 133/89, pulse 83, temperature 98.6 F (37 C), temperature source Oral, resp. rate 18, height '5\' 10"'$  (1.778 m), weight 88.7 kg, SpO2 100 %.  Medical Problem List and Plan: 1.  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  Impaired mobility, was ambulating >200 feet: d/c Lovenox  Patient doesn't want lovenox.  3. Left shoulder/back/foot pain/Pain/diabetic peripheral neuropathy: continue Oxycodone prn. Increase steroids for gout flare. Will do 60 mg daily x 2 days, then taper. Pt rates pain as "11/10". Needs to bring in shoes so able to walk  better; If steroids PO doesn't work, might need L shoulder injection. Pt hates needles.   8/1- added Lidoderm patches for Shoulder and back pain. 8pm to 8am  8/6 increase gabapentin to '300mg'$  TID, increase to 600 3 times daily on 8/13 Increase Oxy to 15 mg every 4 as needed  Prednisone 60x1 ordered on 8/13 Controlled on 8/14 4. Mood: LCSW to follow for evaluation and support.              -antipsychotic agents: N/A 5. Neuropsych: This patient is capable of making decisions on his own behalf. 6. Skin/Wound  Care: Routine pressure relief measures.  7. Fluids/Electrolytes/Nutrition: Strict I/Os 8. PJP PNA/leukocytosis:  Duration of meds discussed with pharmacy who relayed that patient has completed 21+ days of steroids and primaquine. --Clindamycin thorough 07/29 to complete 21 days. ID consulted, appreciate recs-recommended VIR evaluation.   VIR are recommending foot ultrasound, order placed 9. Gout flare: Was on colchicine 0.6 mg daily PTA. --prednisone taper completed 8/9- colchicine dose x2 again to see if will help feet pain.  10. T2DM: Hgb A1c- 5.4. Was on glucotrol/metformin bid PTA.             --continue Lantus while on steroids with SSI for elevated BS.  Labile on 8/14, monitor for trend 11. Anxiety d/o: Was on Klonopin TID PTA. --bid effective at this time. 12. HTN: Monitor BP tid--continue hydralazine and Norvasc.  Relatively controlled on 8/14 13. HIV: On Epivir and Tivicay 14. Anemia: H/H steadily improving.   Hemoglobin 8.7 on 8/13 15. Hypomagnesemia: MgOx 400 mg BID  Magnesium 1.7 on 8/9 16. Leukocytosis Appreciate ID recs-ordered Rh factor and quantiferon TB testing- also GC swabs  WBCs 13.1 on 8/13, labs ordered for tomorrow  Fevers resolved  Await further ID recs  17. Dysphagia: Resolved 18. Urinary retention-resolved 19. Bowel incontinence- Improved 20. Chipped nails-?  Improving.  21. GERD  87/28-restarted Protonix- also has tums prn. 22. Pressure injury to buttocks: offload q48mnutes 23. Bilateral lower extremity swelling: ice, elevated, compression garments 24. Shoulder pain: continue lidocaine patch and voltaren gel.  25. Nearsighted- schedule outpatient ophthalmology eval- should be checked for diabetic retinopathy 26. Diabetic peripheral neuropathy: will benefit from outpatient Qutenza. Ordered diabetic shoes  -gabapentin increased as above  Mildly elevated on 8/13 27. Lower extremity edema:resumed '10mg'$  daily Lasix.  28. AKI:   Creatinine 1.54 on 8/14,  exacerbated by diuretics 29.  Mild hyponatremia  Sodium 134 on 8/14, continue to monitor  LOS: 19 days A FACE TO FACE EVALUATION WAS PERFORMED  Patty Lopezgarcia ALorie Phenix8/14/2022, 8:31 AM

## 2021-04-18 NOTE — Progress Notes (Addendum)
Nephrology Progress Note:   Subjective: He had 2.4 liters UOP over 8/13.  Feels better today.   Has been able to tolerate moving a little more.   Review of systems:  Denies shortness of breath  Decreased mobility 2/2 foot pain Denies n/v Denies cp   Objective Vital signs in last 24 hours: Vitals:   04/18/21 0433 04/18/21 0633 04/18/21 0843 04/18/21 1405  BP: 137/83 133/89 129/81 135/86  Pulse: 87 83 85 94  Resp: '18  18 18  '$ Temp: 98.6 F (37 C)  97.7 F (36.5 C) 99 F (37.2 C)  TempSrc: Oral     SpO2: 100%  99%   Weight: 88.7 kg     Height:       Weight change: 4.6 kg  Intake/Output Summary (Last 24 hours) at 04/18/2021 1447 Last data filed at 04/18/2021 1315 Gross per 24 hour  Intake 1020 ml  Output 1775 ml  Net -755 ml    Assessment/ Plan: Pt is a 45 y.o. yo male with HIV/AIDS, DM, baseline CKD who was admitted on 03/30/2021 with acute hypoxic resp failure with sepsis and AKI.  New diagnosis HIV and with PJP PNA during acute hospitalization now s/p course of abx for same   Assessment/Plan: 1. AKI  prior AKI in the setting of hypoxic resp failure, ATN with septic shock and also urinary retention.  Off and on CRRT-  most recently stopped on 7/15 and temp HD catheter out.  Note 02/2020 Cr 2.80 and 05/2011 Cr 0.90 - only prior labs in [facility].  reported baseline crt in the high 2's however this was based on limited data and may have just been reflective of prior AKI episode.   - Will start lasix 40 mg PO MWF and would plan to discharge on this dose - strict ins/outs ordered - daily weights ordered  2. Chronic hypoxic resp failure s/p trach - now decannulated  3. HIV - regimen per primary team/ID  4. HTN - controlled. Recent addition of metoprolol. Stopped amlodipine in the event it is contributing to swelling  5. Anemia normocytic - no acute need for PRBC's or ESA.  Iron deficiency. IV iron x 2 doses ordered  6. Impaired mobility - per CIR team  7.  Urinary  retention-  continue flomax and note foley is removed  8. Hypomagnesemia  - repleted; mag in am  Disposition - Disposition per rehab team.  Nephrology will sign off.  I will set up follow-up appt in 3-4 weeks with [facility]    Labs: Basic Metabolic Panel: Recent Labs  Lab 04/16/21 0533 04/17/21 0612 04/18/21 0629  NA 133* 133* 134*  K 4.3 4.1 4.3  CL 99 100 101  CO2 22 22 21*  GLUCOSE 116* 126* 124*  BUN 45* 49* 47*  CREATININE 1.63* 1.85* 1.54*  CALCIUM 9.4 9.6 9.9  PHOS  --   --  4.7*   Liver Function Tests: Recent Labs  Lab 04/12/21 0644 04/18/21 0629  ALBUMIN 2.8* 2.9*    CBC: Recent Labs  Lab 04/13/21 0502 04/13/21 0502 04/14/21 0541 04/15/21 0541 04/16/21 0533 04/17/21 0612  WBC 14.7*  --  16.4* 17.3* 13.3* 13.1*  NEUTROABS  --    < > 12.7* 13.6* 10.1* 10.1*  HGB 7.9*  --  8.5* 9.0* 8.5* 8.7*  HCT 25.4*  --  26.9* 28.4* 27.4* 28.2*  MCV 95.8  --  95.7 94.7 95.8 97.2  PLT 191  --  194 189 205 209   < > =  values in this interval not displayed.    Studies/Results: [place] RT LOWER EXTREM LTD SOFT TISSUE NON VASCULAR  Result Date: 04/16/2021 CLINICAL DATA:  Edema and fluid EXAM: ULTRASOUND right LOWER EXTREMITY LIMITED TECHNIQUE: Ultrasound examination of the lower extremity soft tissues was performed in the area of clinical concern. COMPARISON:  MRI 04/14/2021 FINDINGS: Targeted sonography of the right foot performed. Marked subcutaneous edema within the right foot and ankle. Hypoechoic mass or fluid collection between the first and second digit measuring 1.8 x 1 x 1.5 cm and between the third and fourth digit measuring 1.4 x 0.9 x 1.1 cm IMPRESSION: Hypoechoic avascular masses versus complex fluid collections between the first and second digits and the third and fourth digits, possibly corresponding to MRI nodules, see previous report. Follow-up contrast enhanced exam could be considered. Electronically Signed   By: Donavan Foil M.D.   On: 04/16/2021 20:52    VAS [place] UPPER EXTREMITY VENOUS DUPLEX  Result Date: 04/17/2021 UPPER VENOUS STUDY  Patient Name:  PRAJIT BALTAZAR  Date of Exam:   04/17/2021 Medical Rec #: [id number]       Accession #:    [id number] Date of Birth: November 13, 1975       Patient Gender: M Patient Age:   70 years Exam Location:  [facility] Procedure:      VAS [place] UPPER EXTREMITY VENOUS DUPLEX Referring Phys: Thelma Comp PATEL --------------------------------------------------------------------------------  Indications: Pain, and Edema Risk Factors: Gout. Comparison Study: No prior right UEV on file Performing Technologist: Sharion Dove RVS  Examination Guidelines: A complete evaluation includes B-mode imaging, spectral Doppler, color Doppler, and power Doppler as needed of all accessible portions of each vessel. Bilateral testing is considered an integral part of a complete examination. Limited examinations for reoccurring indications may be performed as noted.  Right Findings: +----------+------------+---------+-----------+----------+---------------+ RIGHT     CompressiblePhasicitySpontaneousProperties    Summary     +----------+------------+---------+-----------+----------+---------------+ IJV           Full       Yes       Yes                              +----------+------------+---------+-----------+----------+---------------+ Subclavian               Yes       Yes                              +----------+------------+---------+-----------+----------+---------------+ Axillary                 Yes       Yes                              +----------+------------+---------+-----------+----------+---------------+ Brachial      Full       Yes       Yes                              +----------+------------+---------+-----------+----------+---------------+ Radial        Full                                                  +----------+------------+---------+-----------+----------+---------------+  Ulnar                                                patent by color +----------+------------+---------+-----------+----------+---------------+ Cephalic      Full                                                  +----------+------------+---------+-----------+----------+---------------+ Basilic       Full                                                  +----------+------------+---------+-----------+----------+---------------+  Left Findings: +----------+------------+---------+-----------+----------+-------+ LEFT      CompressiblePhasicitySpontaneousPropertiesSummary +----------+------------+---------+-----------+----------+-------+ Subclavian               Yes       Yes                      +----------+------------+---------+-----------+----------+-------+  Summary:  Right: No evidence of deep vein thrombosis in the upper extremity. No evidence of superficial vein thrombosis in the upper extremity.  Left: No evidence of thrombosis in the subclavian.  *See table(s) above for measurements and observations.     Preliminary    Medications: Infusions:    Scheduled Medications:  (feeding supplement) PROSource Plus  30 mL Oral TID BM   clonazePAM  0.5 mg Oral TID   dapsone  100 mg Oral Daily   diclofenac Sodium  2 g Topical QID   dolutegravir  50 mg Oral Daily   gabapentin  600 mg Oral Q12H   hydrALAZINE  25 mg Oral Q8H   insulin aspart  0-15 Units Subcutaneous TID WC   insulin aspart  0-5 Units Subcutaneous QHS   insulin glargine-yfgn  5 Units Subcutaneous Daily   lamiVUDine  300 mg Oral Daily   lidocaine  1 patch Transdermal Q24H   lidocaine  2 patch Transdermal Q24H   magnesium oxide  400 mg Oral BID   metoprolol tartrate  25 mg Oral BID   multivitamin with minerals  1 tablet Oral Daily   pantoprazole  40 mg Oral Daily   tamsulosin  0.4 mg Oral QPC supper    have reviewed scheduled and prn medications.  Physical Exam:   General adult male in bed in no acute distress HEENT  normocephalic atraumatic extraocular movements intact sclera anicteric Neck supple trachea midline; bandage over old trach site Lungs normal work of breathing at rest on room air Abdomen soft nontender nondistended Extremities 1+ edema of feet; tender; feet are wrapped. No edema of legs Psych normal mood and affect Neuro - awake and conversant; follows commands and provides hx   Claudia Desanctis, MD 04/18/2021, 3:00 PM

## 2021-04-18 NOTE — Significant Event (Signed)
   04/18/21 2208  Assess: MEWS Score  Temp (!) 102.9 F (39.4 C)  BP 135/62  Pulse Rate (!) 124  Resp 20  SpO2 97 %  O2 Device Room Air  Assess: MEWS Score  MEWS Temp 2  MEWS Systolic 0  MEWS Pulse 2  MEWS RR 0  MEWS LOC 0  MEWS Score 4  MEWS Score Color Red  Assess: if the MEWS score is Yellow or Red  Were vital signs taken at a resting state? Yes  Focused Assessment No change from prior assessment  Early Detection of Sepsis Score *See Row Information* High  MEWS guidelines implemented *See Row Information* Yes  Take Vital Signs  Increase Vital Sign Frequency  Red: Q 1hr X 4 then Q 4hr X 4, if remains red, continue Q 4hrs  Escalate  MEWS: Escalate Red: discuss with charge nurse/RN and provider, consider discussing with RRT  Notify: Charge Nurse/RN  Name of Charge Nurse/RN Notified Patrici Ranks, RN  Date Charge Nurse/RN Notified 04/18/21  Time Charge Nurse/RN Notified 2215  Notify: Provider  Provider Name/Title Dr. Delice Lesch  Date Provider Notified 04/18/21  Time Provider Notified 2214  Notification Type Call  Notification Reason Change in status  Provider response No new orders  Date of Provider Response 04/18/21  Time of Provider Response 2214

## 2021-04-18 NOTE — Significant Event (Signed)
   04/18/21 1953  Assess: MEWS Score  Temp (!) 100.9 F (38.3 C)  BP 131/81  Pulse Rate (!) 115  Resp 18  SpO2 100 %  O2 Device Room Air  Assess: MEWS Score  MEWS Temp 1  MEWS Systolic 0  MEWS Pulse 2  MEWS RR 0  MEWS LOC 0  MEWS Score 3  MEWS Score Color Yellow  Assess: if the MEWS score is Yellow or Red  Were vital signs taken at a resting state? Yes  Focused Assessment No change from prior assessment  Early Detection of Sepsis Score *See Row Information* High  MEWS guidelines implemented *See Row Information* Yes  Take Vital Signs  Increase Vital Sign Frequency  Yellow: Q 2hr X 2 then Q 4hr X 2, if remains yellow, continue Q 4hrs  Escalate  MEWS: Escalate Yellow: discuss with charge nurse/RN and consider discussing with provider and RRT  Notify: Charge Nurse/RN  Name of Charge Nurse/RN Notified Patrici Ranks, RN  Date Charge Nurse/RN Notified 04/18/21  Time Charge Nurse/RN Notified 2000  Notify: Provider  Provider Name/Title Dr. Delice Lesch  Date Provider Notified 04/18/21  Time Provider Notified 2004  Notification Type Call  Notification Reason Change in status  Provider response See new orders (Prednisone ordered)  Date of Provider Response 04/18/21  Time of Provider Response 2004

## 2021-04-18 NOTE — Progress Notes (Signed)
Pt MEWS score currently yellow. On call physician made aware of current vital signs. Pt currently c/o pain in his feet and pain in his right arm where prior IV was. Right arm slightly swollen. MD made aware. Order received for prednisone PO.

## 2021-04-18 NOTE — Progress Notes (Signed)
Occupational Therapy Session Note  Patient Details  Name: LADARIS BESSE MRN: [id number] Date of Birth: 1976/03/28  Today's Date: 04/19/2021 OT Individual Time: 1445-1530 OT Individual Time Calculation (min): 45 min   Skilled Therapeutic Interventions/Progress Updates:    Pt greeted in bed, feet very painful and pt initially refusing to transfer OOB. Provided him with a free reading book and discussed importance of therapeutic activity in the day to to minimize perseveration on pain. Also provided him with lavender aromatherapy to promote calmness per pt request. At this time, pt reporting pain was manageable enough to transfer OOB. Mod I for scoot transfer to w/c. He was able to don both leg rests with min cues to lock w/c brakes before attempting. He c/o ELRs being too short. Therefore worked on [employer] strengthening/endurance while he self propelled w/c to the wheelchair supply room. Pt picked out new ELRs for the chair that were longer and therefore more comfortable when he kept his legs elevated in the w/c for edema control. He self propelled back to the room and remained sitting up, all needs within reach and safety belt fastened.   Therapy Documentation Precautions:  Precautions Precautions: Fall Precaution Comments: Hypersentive and painful feet Restrictions Weight Bearing Restrictions: No  Vital Signs: Therapy Vitals Temp: 98.7 F (37.1 C) Temp Source: Oral Pulse Rate: 83 Resp: 17 BP: 134/83 Patient Position (if appropriate): Lying Oxygen Therapy SpO2: 99 % O2 Device: Room Air Pain: in feet, per pt, premedicated Pain Assessment Pain Score: 2  PAINAD (Pain Assessment in Advanced Dementia) Breathing: normal Negative Vocalization: none Facial Expression: smiling or inexpressive Body Language: relaxed ADL: ADL Eating: Independent Grooming: Modified independent Where Assessed-Grooming: Sitting at sink Upper Body Bathing: Setup Where Assessed-Upper Body Bathing: Sitting at  sink Lower Body Bathing: Setup Where Assessed-Lower Body Bathing: Sitting at sink, Standing at sink Upper Body Dressing: Modified independent (Device) Where Assessed-Upper Body Dressing: Standing at sink Lower Body Dressing: Modified independent Where Assessed-Lower Body Dressing: Sitting at sink, Standing at sink Toileting: Modified independent Toilet Transfer: Contact guard (per evaluating PT) Toilet Transfer Method: Squat pivot Tub/Shower Transfer: Close supervison Social research officer, government: Not assessed ADL Comments: Using clinical judgement, pt can perform BADLs from bedlevel at Mod I level      Therapy/Group: Individual Therapy  Betzalel Umbarger A Trayce Caravello 04/19/2021, 3:42 PM

## 2021-04-18 NOTE — Progress Notes (Signed)
Pt's current MEWS is red. On call physician made aware of current vital signs.Pt continues to have pain 9/10 in feet. Tylenol PO administered. MD made aware.

## 2021-04-19 ENCOUNTER — Other Ambulatory Visit (HOSPITAL_COMMUNITY): Payer: Self-pay

## 2021-04-19 LAB — CBC WITH DIFFERENTIAL/PLATELET
Abs Immature Granulocytes: 0.12 10*3/uL — ABNORMAL HIGH (ref 0.00–0.07)
Basophils Absolute: 0 10*3/uL (ref 0.0–0.1)
Basophils Relative: 0 %
Eosinophils Absolute: 0 10*3/uL (ref 0.0–0.5)
Eosinophils Relative: 0 %
HCT: 27.2 % — ABNORMAL LOW (ref 39.0–52.0)
Hemoglobin: 8.3 g/dL — ABNORMAL LOW (ref 13.0–17.0)
Immature Granulocytes: 1 %
Lymphocytes Relative: 5 %
Lymphs Abs: 1 10*3/uL (ref 0.7–4.0)
MCH: 29.2 pg (ref 26.0–34.0)
MCHC: 30.5 g/dL (ref 30.0–36.0)
MCV: 95.8 fL (ref 80.0–100.0)
Monocytes Absolute: 1.1 10*3/uL — ABNORMAL HIGH (ref 0.1–1.0)
Monocytes Relative: 5 %
Neutro Abs: 18.7 10*3/uL — ABNORMAL HIGH (ref 1.7–7.7)
Neutrophils Relative %: 89 %
Platelets: 277 10*3/uL (ref 150–400)
RBC: 2.84 MIL/uL — ABNORMAL LOW (ref 4.22–5.81)
RDW: 18.6 % — ABNORMAL HIGH (ref 11.5–15.5)
WBC: 21 10*3/uL — ABNORMAL HIGH (ref 4.0–10.5)
nRBC: 0 % (ref 0.0–0.2)

## 2021-04-19 LAB — BASIC METABOLIC PANEL
Anion gap: 12 (ref 5–15)
BUN: 54 mg/dL — ABNORMAL HIGH (ref 6–20)
CO2: 21 mmol/L — ABNORMAL LOW (ref 22–32)
Calcium: 9.8 mg/dL (ref 8.9–10.3)
Chloride: 100 mmol/L (ref 98–111)
Creatinine, Ser: 1.65 mg/dL — ABNORMAL HIGH (ref 0.61–1.24)
GFR, Estimated: 52 mL/min — ABNORMAL LOW (ref >60)
Glucose, Bld: 112 mg/dL — ABNORMAL HIGH (ref 70–99)
Potassium: 4.6 mmol/L (ref 3.5–5.1)
Sodium: 133 mmol/L — ABNORMAL LOW (ref 135–145)

## 2021-04-19 LAB — MAGNESIUM: Magnesium: 1.7 mg/dL (ref 1.7–2.4)

## 2021-04-19 LAB — SARS CORONAVIRUS 2 (TAT 6-24 HRS): SARS Coronavirus 2: NEGATIVE

## 2021-04-19 LAB — PROCALCITONIN: Procalcitonin: 0.31 ng/mL

## 2021-04-19 LAB — CYTOLOGY - NON PAP
Chlamydia: NEGATIVE
Comment: NEGATIVE
Comment: NORMAL
Neisseria Gonorrhea: NEGATIVE

## 2021-04-19 LAB — CULTURE, BLOOD (ROUTINE X 2)
Culture: NO GROWTH
Culture: NO GROWTH
Special Requests: ADEQUATE
Special Requests: ADEQUATE

## 2021-04-19 LAB — GLUCOSE, CAPILLARY
Glucose-Capillary: 248 mg/dL — ABNORMAL HIGH (ref 70–99)
Glucose-Capillary: 122 mg/dL — ABNORMAL HIGH (ref 70–99)
Glucose-Capillary: 139 mg/dL — ABNORMAL HIGH (ref 70–99)
Glucose-Capillary: 135 mg/dL — ABNORMAL HIGH (ref 70–99)

## 2021-04-19 LAB — URINALYSIS, ROUTINE W REFLEX MICROSCOPIC
Bilirubin Urine: NEGATIVE
Glucose, UA: NEGATIVE mg/dL
Hgb urine dipstick: NEGATIVE
Ketones, ur: NEGATIVE mg/dL
Leukocytes,Ua: NEGATIVE
Nitrite: NEGATIVE
Protein, ur: NEGATIVE mg/dL
Specific Gravity, Urine: 1.012 (ref 1.005–1.030)
pH: 5 (ref 5.0–8.0)

## 2021-04-19 LAB — LACTIC ACID, PLASMA
Lactic Acid, Venous: 1.5 mmol/L (ref 0.5–1.9)
Lactic Acid, Venous: 1.4 mmol/L (ref 0.5–1.9)

## 2021-04-19 NOTE — Progress Notes (Signed)
PROGRESS NOTE   Subjective/Complaints:  Pt spiked temp to 102.9 overnight with HR of 120s-130s at rest- red MEWS.  Pt said "feels like crap"- aching and feet hurt so bad cannot walk at all- swelling has improved greatly in legs/feet, however still hurt really "bad"- also doesn't think gout pills "helped".   Unasyn and Vanc started overnight due to fever/red MEWS- is negative for GC and Chlamydia.  Lactic Acid was 1.5 las tnight as well- Procalcitonin not elevated really- pending CBC and BMP.   Said feet were sausages yesterday but better today.    ROS:  Pt denies SOB, abd pain, CP, N/V/C/D, and vision changes   Objective:   DG CHEST PORT 1 VIEW  Result Date: 04/18/2021 CLINICAL DATA:  Fevers EXAM: PORTABLE CHEST 1 VIEW COMPARISON:  04/14/2021 FINDINGS: Cardiac shadow is stable. Scarring is noted in the lungs bilaterally stable from the prior study. No focal infiltrate or sizable effusion is noted. No bony abnormality is seen. IMPRESSION: No active disease. Electronically Signed   By: Inez Catalina M.D.   On: 04/18/2021 23:50   VAS [place] UPPER EXTREMITY VENOUS DUPLEX  Result Date: 04/18/2021 UPPER VENOUS STUDY  Patient Name:  CESC VINCENTE  Date of Exam:   04/17/2021 Medical Rec #: [id number]       Accession #:    [id number] Date of Birth: 01-Jul-1976       Patient Gender: M Patient Age:   45 years Exam Location:  [facility] Procedure:      VAS [place] UPPER EXTREMITY VENOUS DUPLEX Referring Phys: Thelma Comp PATEL --------------------------------------------------------------------------------  Indications: Pain, and Edema Risk Factors: Gout. Comparison Study: No prior right UEV on file Performing Technologist: Sharion Dove RVS  Examination Guidelines: A complete evaluation includes B-mode imaging, spectral Doppler, color Doppler, and power Doppler as needed of all accessible portions of each vessel. Bilateral testing is considered an  integral part of a complete examination. Limited examinations for reoccurring indications may be performed as noted.  Right Findings: +----------+------------+---------+-----------+----------+---------------+ RIGHT     CompressiblePhasicitySpontaneousProperties    Summary     +----------+------------+---------+-----------+----------+---------------+ IJV           Full       Yes       Yes                              +----------+------------+---------+-----------+----------+---------------+ Subclavian               Yes       Yes                              +----------+------------+---------+-----------+----------+---------------+ Axillary                 Yes       Yes                              +----------+------------+---------+-----------+----------+---------------+ Brachial      Full       Yes       Yes                              +----------+------------+---------+-----------+----------+---------------+  Radial        Full                                                  +----------+------------+---------+-----------+----------+---------------+ Ulnar                                               patent by color +----------+------------+---------+-----------+----------+---------------+ Cephalic      Full                                                  +----------+------------+---------+-----------+----------+---------------+ Basilic       Full                                                  +----------+------------+---------+-----------+----------+---------------+  Left Findings: +----------+------------+---------+-----------+----------+-------+ LEFT      CompressiblePhasicitySpontaneousPropertiesSummary +----------+------------+---------+-----------+----------+-------+ Subclavian               Yes       Yes                      +----------+------------+---------+-----------+----------+-------+  Summary:  Right: No evidence of deep  vein thrombosis in the upper extremity. No evidence of superficial vein thrombosis in the upper extremity.  Left: No evidence of thrombosis in the subclavian.  *See table(s) above for measurements and observations.  Diagnosing physician: Harold Barban MD Electronically signed by Harold Barban MD on 04/18/2021 at 3:28:39 PM.    Final    Recent Labs    04/17/21 0612  WBC 13.1*  HGB 8.7*  HCT 28.2*  PLT 209     Recent Labs    04/17/21 0612 04/18/21 0629  NA 133* 134*  K 4.1 4.3  CL 100 101  CO2 22 21*  GLUCOSE 126* 124*  BUN 49* 47*  CREATININE 1.85* 1.54*  CALCIUM 9.6 9.9    Intake/Output Summary (Last 24 hours) at 04/19/2021 0849 Last data filed at 04/19/2021 [id number] Gross per 24 hour  Intake 1220 ml  Output 1800 ml  Net -580 ml         Physical Exam: Vital Signs Blood pressure 127/75, pulse 85, temperature 98.8 F (37.1 C), resp. rate 18, height '5\' 10"'$  (1.778 m), weight 83 kg, SpO2 97 %.    General: awake, alert, appropriate, sitting up in bed- kept moving, like in pain; ate 75+% of breakfast; NAD HENT: conjugate gaze; oropharynx moist CV: regular rate; no JVD Pulmonary: CTA B/L; no W/R/R- good air movement- sounds great GI: soft, NT, ND, (+)BS; normoactive Psychiatric: appropriate Neurological: alert  Musc: feet down to 1+ edema this AM and trace to zero in legs B/L however VERY TTP over dorsum of feet B/L- L>R Neuro: Alert Motor: Grossly intact, some limitations due to pain in bilateral feet  Assessment/Plan: 1. Functional deficits which require 3+ hours per day of interdisciplinary therapy in a comprehensive inpatient rehab setting. Physiatrist is providing close team supervision and 24 hour management  of active medical problems listed below. Physiatrist and rehab team continue to assess barriers to discharge/monitor patient progress toward functional and medical goals  Care Tool:  Bathing    Body parts bathed by patient: Right arm, Left arm, Chest,  Abdomen, Right upper leg, Left upper leg, Face, Front perineal area, Right lower leg, Left lower leg, Buttocks   Body parts bathed by helper: Buttocks, Right lower leg, Left lower leg     Bathing assist Assist Level: Independent with assistive device (bedlevel, clinical judgement)     Upper Body Dressing/Undressing Upper body dressing   What is the patient wearing?: Pull over shirt    Upper body assist Assist Level: Independent with assistive device (bedlevel, clinical judgement)    Lower Body Dressing/Undressing Lower body dressing      What is the patient wearing?: Underwear/pull up, Pants     Lower body assist Assist for lower body dressing: Independent with assitive device (bedlevel, clinical judgement)     Toileting Toileting    Toileting assist Assist for toileting: Independent with assistive device (per PT)     Transfers Chair/bed transfer  Transfers assist     Chair/bed transfer assist level: Contact Guard/Touching assist Chair/bed transfer assistive device: Programmer, multimedia   Ambulation assist   Ambulation activity did not occur: Refused  Assist level: Supervision/Verbal cueing Assistive device: Walker-rolling Max distance: 300 ft   Walk 10 feet activity   Assist  Walk 10 feet activity did not occur: Refused  Assist level: Independent with assistive device Assistive device: Walker-rolling   Walk 50 feet activity   Assist Walk 50 feet with 2 turns activity did not occur: Refused  Assist level: Independent with assistive device Assistive device: Walker-rolling    Walk 150 feet activity   Assist Walk 150 feet activity did not occur: Refused  Assist level: Supervision/Verbal cueing Assistive device: Walker-rolling    Walk 10 feet on uneven surface  activity   Assist Walk 10 feet on uneven surfaces activity did not occur: Safety/medical concerns   Assist level: Supervision/Verbal cueing Assistive device:  Aeronautical engineer Will patient use wheelchair at discharge?: Yes Type of Wheelchair: Manual Wheelchair activity did not occur: Refused (Pt stated he would not leave his room)  Wheelchair assist level: Independent Max wheelchair distance: 150 ft    Wheelchair 50 feet with 2 turns activity    Assist    Wheelchair 50 feet with 2 turns activity did not occur: Refused   Assist Level: Independent   Wheelchair 150 feet activity     Assist  Wheelchair 150 feet activity did not occur: Refused   Assist Level: Independent   Blood pressure 127/75, pulse 85, temperature 98.8 F (37.1 C), resp. rate 18, height '5\' 10"'$  (1.778 m), weight 83 kg, SpO2 97 %.  Medical Problem List and Plan: 1.  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  Impaired mobility, was ambulating >200 feet: d/c Lovenox  Patient doesn't want lovenox.  3. Left shoulder/back/foot pain/Pain/diabetic peripheral neuropathy: continue Oxycodone prn. Increase steroids for gout flare. Will do 60 mg daily x 2 days, then taper. Pt rates pain as "11/10". Needs to bring in shoes so able to walk better; If steroids PO doesn't work, might need L shoulder injection. Pt hates needles.   8/1- added Lidoderm patches for Shoulder and back pain. 8pm to 8am  8/6 increase gabapentin to  $'300mg'W$  TID, increase to 600 3 times daily on 8/13 Increase Oxy to 15 mg every 4 as needed  Prednisone 60x1 ordered on 8/13 8/15- pain is uncontrolled per pt- wait on Gout/colchicine with ID w/u in progress- and can add this afternoon/tomorrow if needed.  4. Mood: LCSW to follow for evaluation and support.              -antipsychotic agents: N/A 5. Neuropsych: This patient is capable of making decisions on his own behalf. 6. Skin/Wound Care: Routine pressure relief measures.  7. Fluids/Electrolytes/Nutrition: Strict I/Os 8. PJP PNA/leukocytosis:  Duration of  meds discussed with pharmacy who relayed that patient has completed 21+ days of steroids and primaquine. --Clindamycin thorough 07/29 to complete 21 days. ID consulted, appreciate recs-recommended VIR evaluation.   VIR are recommending foot ultrasound, order placed 9. Gout flare: Was on colchicine 0.6 mg daily PTA. --prednisone taper completed 8/9- colchicine dose x2 again to see if will help feet pain.  10. T2DM: Hgb A1c- 5.4. Was on glucotrol/metformin bid PTA.             --continue Lantus while on steroids with SSI for elevated BS.  Labile on 8/14, monitor for trend  8/15- BG's usually controlled but had 1 episode of 248 this AM- will monitor 11. Anxiety d/o: Was on Klonopin TID PTA. --bid effective at this time. 12. HTN: Monitor BP tid--continue hydralazine and Norvasc.  Relatively controlled on 8/14 13. HIV: On Epivir and Tivicay 14. Anemia: H/H steadily improving.   Hemoglobin 8.7 on 8/13 15. Hypomagnesemia: MgOx 400 mg BID  Magnesium 1.7 on 8/9 16. Leukocytosis Appreciate ID recs-ordered Rh factor and quantiferon TB testing- also GC swabs  WBCs 13.1 on 8/13, labs ordered for tomorrow  Fevers resolved  Await further ID recs   8/15- labs pending- ID called due ot fever of 102.9 and red MEWS with HR to 120s-130s at rest 17. Dysphagia: Resolved 18. Urinary retention-resolved 19. Bowel incontinence- Improved 20. Chipped nails-?  Improving.  21. GERD  87/28-restarted Protonix- also has tums prn. 22. Pressure injury to buttocks: offload q5mnutes 23. Bilateral lower extremity swelling: ice, elevated, compression garments 24. Shoulder pain: continue lidocaine patch and voltaren gel.  25. Nearsighted- schedule outpatient ophthalmology eval- should be checked for diabetic retinopathy 26. Diabetic peripheral neuropathy: will benefit from outpatient Qutenza. Ordered diabetic shoes  -gabapentin increased as above  Mildly elevated on 8/13 27. Lower extremity edema:resumed '10mg'$   daily Lasix.  28. AKI:   Creatinine 1.54 on 8/14, exacerbated by diuretics  8/15- labs pending this AM 29.  Mild hyponatremia  Sodium 134 on 8/14, continue to monitor   I spent a total of 36 minutes on visit-/care today- >50% of coordination of care- speaking with PA as well as ID about pt.    LOS: 20 days A FACE TO FACE EVALUATION WAS PERFORMED  Yaneliz Radebaugh 04/19/2021, 8:49 AM

## 2021-04-19 NOTE — NC FL2 (Signed)
[place] LEVEL OF CARE SCREENING TOOL     IDENTIFICATION  Patient Name: Bruce Hunter Birthdate: [birth year] Sex: male Admission Date (Current Location): 03/30/2021  [place] and [place] Number:  Kathleen Argue [id number] Facility and Address:  [facility] [place], [place] [address] [place], [place] [address]      Provider Number: [id number]  Attending Physician Name and Address:  Courtney Heys, MD  Relative Name and Phone Number:  Sunday Shams [phone]    Current Level of Care: Other (Comment) (rehab) Recommended Level of Care: [place] Prior Approval Number:    Date Approved/Denied:   PASRR Number: [id number]  Discharge Plan: SNF    Current Diagnoses: Patient Active Problem List   Diagnosis Date Noted   Hyponatremia    AKI (acute kidney injury) ([place])    SIRS (systemic inflammatory response syndrome) (HCC)    Leukocytosis    Bilateral swelling of feet    Debility 03/30/2021   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   Gout attack 03/30/2021   Acute left ankle pain    Acute pain of left shoulder    Altered mental status    Status post tracheostomy ([place])    Severe sepsis ([place])    Acute metabolic encephalopathy    Pressure injury of skin 03/09/2021   Septic shock ([place]) 03/08/2021   Acute respiratory failure with hypoxemia ([place]) 03/05/2021   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 ([place]) 03/05/2021   Elevated LFTs 03/05/2021   Multifocal pneumonia 03/04/2021   Candida esophagitis (HCC) 02/19/2021   Neuropathic pain 02/19/2021   Polypharmacy 02/19/2021   Cough    N&V (nausea and vomiting)    AIDS (acquired immune deficiency syndrome) (HCC)    Esophageal dysphagia    Gastroesophageal reflux disease with esophagitis without hemorrhage    Gastrointestinal hemorrhage    Abdominal pain    Creatinine elevation    Hypoglycemia 02/12/2021    HTN (hypertension) 02/12/2021   Anemia 02/12/2021   Acute renal failure ([employer]) 02/11/2021    Orientation RESPIRATION BLADDER Height & Weight     Self, Time, Situation, Place  Normal Continent Weight: 182 lb 15.7 oz (83 kg) Height:  '5\' 10"'$  (177.8 cm)  BEHAVIORAL SYMPTOMS/MOOD NEUROLOGICAL BOWEL NUTRITION STATUS      Continent Diet (Carb modified thin liquids)  AMBULATORY STATUS COMMUNICATION OF NEEDS Skin   Extensive Assist Verbally Normal                       Personal Care Assistance Level of Assistance  Bathing, Dressing Bathing Assistance: Limited assistance   Dressing Assistance: Limited assistance     Functional Limitations Info             SPECIAL CARE FACTORS FREQUENCY  PT (By licensed PT), OT (By licensed OT)     PT Frequency: 3-5 x week OT Frequency: 3-5 x week            Contractures Contractures Info: Present    Additional Factors Info  Code Status, Allergies Code Status Info: Full Code Allergies Info: lactose intolerance           Current Medications (04/19/2021):  This is the current hospital active medication list Current Facility-Administered Medications  Medication Dose Route Frequency Provider Last Rate Last Admin   (feeding supplement) PROSource Plus liquid 30 mL  30 mL Oral TID BM Lovorn, Megan, MD   30 mL  at 04/19/21 1237   acetaminophen (TYLENOL) tablet 325-650 mg  325-650 mg Oral Q4H PRN Bary Leriche, PA-C   650 mg at 04/19/21 1036   albuterol (PROVENTIL) (2.5 MG/3ML) 0.083% nebulizer solution 2.5 mg  2.5 mg Nebulization Q2H PRN Love, Pamela S, PA-C       alum & mag hydroxide-simeth (MAALOX/MYLANTA) 200-200-20 MG/5ML suspension 30 mL  30 mL Oral Q4H PRN Love, Pamela S, PA-C       Ampicillin-Sulbactam (UNASYN) 3 g in sodium chloride 0.9 % 100 mL IVPB  3 g Intravenous Q8H Erenest Blank, RPH 200 mL/hr at 04/19/21 1036 3 g at 04/19/21 1036   bisacodyl (DULCOLAX) suppository 10 mg  10 mg Rectal Daily PRN Love, Pamela S, PA-C        calcium carbonate (TUMS - dosed in mg elemental calcium) chewable tablet 200 mg of elemental calcium  1 tablet Oral TID PRN Love, Pamela S, PA-C   200 mg of elemental calcium at 03/31/21 1235   clonazePAM (KLONOPIN) tablet 0.5 mg  0.5 mg Oral TID Love, Pamela S, PA-C   0.5 mg at 04/19/21 [id number]   dapsone tablet 100 mg  100 mg Oral Daily Bary Leriche, PA-C   100 mg at 04/19/21 [id number]   diclofenac Sodium (VOLTAREN) 1 % topical gel 2 g  2 g Topical QID Izora Ribas, MD   2 g at 04/19/21 1224   diphenhydrAMINE (BENADRYL) 12.5 MG/5ML elixir 12.5-25 mg  12.5-25 mg Oral Q6H PRN Love, Pamela S, PA-C       dolutegravir (TIVICAY) tablet 50 mg  50 mg Oral Daily Reesa Chew S, PA-C   50 mg at 04/19/21 [id number]   furosemide (LASIX) tablet 40 mg  40 mg Oral Q M,W,F Claudia Desanctis, MD   40 mg at 04/19/21 [id number]   gabapentin (NEURONTIN) capsule 600 mg  600 mg Oral Q12H Jamse Arn, MD   600 mg at 04/19/21 0925   guaiFENesin-dextromethorphan (ROBITUSSIN DM) 100-10 MG/5ML syrup 5-10 mL  5-10 mL Oral Q6H PRN Love, Pamela S, PA-C       hydrALAZINE (APRESOLINE) tablet 25 mg  25 mg Oral Q8H Love, Pamela S, PA-C   25 mg at 04/19/21 0631   insulin aspart (novoLOG) injection 0-15 Units  0-15 Units Subcutaneous TID WC Bary Leriche, PA-C   2 Units at 04/19/21 1238   insulin aspart (novoLOG) injection 0-5 Units  0-5 Units Subcutaneous QHS Love, Pamela S, PA-C       insulin glargine-yfgn [facility]) injection 5 Units  5 Units Subcutaneous Daily Bary Leriche, PA-C   5 Units at 04/19/21 [id number]   lamiVUDine (EPIVIR) tablet 300 mg  300 mg Oral Daily Bary Leriche, PA-C   300 mg at 04/19/21 0925   lidocaine (LIDODERM) 5 % 1 patch  1 patch Transdermal Q24H Bary Leriche, PA-C   1 patch at 04/18/21 1753   lidocaine (LIDODERM) 5 % 2 patch  2 patch Transdermal Q24H Lovorn, Megan, MD   2 patch at 04/18/21 1752   lip balm (CARMEX) ointment   Topical PRN Love, Ivan Anchors, PA-C       magnesium oxide (MAG-OX) tablet 400 mg  400 mg Oral  BID Lovorn, Megan, MD   400 mg at 04/19/21 0926   metoprolol tartrate (LOPRESSOR) tablet 25 mg  25 mg Oral BID Claudia Desanctis, MD   25 mg at 04/19/21 [id number]   multivitamin with minerals tablet 1 tablet  1 tablet  Oral Daily Bary Leriche, [place]   1 tablet at 04/18/21 1428   oxyCODONE (Oxy IR/ROXICODONE) immediate release tablet 15 mg  15 mg Oral Q4H PRN Jamse Arn, MD   15 mg at 04/19/21 0925   pantoprazole (PROTONIX) EC tablet 40 mg  40 mg Oral Daily Lovorn, Megan, MD   40 mg at 04/19/21 0925   polyethylene glycol (MIRALAX / GLYCOLAX) packet 17 g  17 g Oral Daily PRN Love, Pamela S, PA-C       prochlorperazine (COMPAZINE) tablet 5-10 mg  5-10 mg Oral Q6H PRN Love, Pamela S, PA-C       Or   prochlorperazine (COMPAZINE) injection 5-10 mg  5-10 mg Intramuscular Q6H PRN Love, Pamela S, PA-C       Or   prochlorperazine (COMPAZINE) suppository 12.5 mg  12.5 mg Rectal Q6H PRN Love, Pamela S, PA-C       tamsulosin (FLOMAX) capsule 0.4 mg  0.4 mg Oral QPC supper Lovorn, Megan, MD   0.4 mg at 04/18/21 1752   traZODone (DESYREL) tablet 25-50 mg  25-50 mg Oral QHS PRN Bary Leriche, PA-C   50 mg at 04/17/21 2334   vancomycin (VANCOREADY) IVPB 2000 mg/400 mL  2,000 mg Intravenous Q24H Erenest Blank, RPH 200 mL/hr at 04/19/21 0134 2,000 mg at 04/19/21 0134     Discharge Medications: Please see discharge summary for a list of discharge medications.  Relevant Imaging Results:  Relevant Lab Results:   Additional Information has not been COVID vaccinated  Chelsea Pedretti, Gardiner Rhyme, LCSW

## 2021-04-19 NOTE — Progress Notes (Signed)
Physical Therapy Session Note  Patient Details  Name: Bruce Hunter MRN: [id number] Date of Birth: 10-24-1975  Today's Date: 04/19/2021 PT Individual Time: 0488-8916 PT Individual Time Calculation (min): 45 min   Short Term Goals: Week 1:  PT Short Term Goal 1 (Week 1): =LTGs d/t ELOS PT Short Term Goal 1 - Progress (Week 1): Met PT Short Term Goal 2 (Week 1): Pt will tolerate x 30 minutes of activity PT Short Term Goal 2 - Progress (Week 1): Met PT Short Term Goal 3 (Week 1): Pt will tolerate sitting OOB for 30 + minutes PT Short Term Goal 3 - Progress (Week 1): Met  Skilled Therapeutic Interventions/Progress Updates:    Pt seated on BSC on arrival. Would not let therapist approach for guarding, mod I transfer BSC>EOB. Pt then pulled up pants by leaning laterally on EOB. Pt transferred to w/c with squat pivot mod I. Pt then became tearful about his condition and the possibility of not being home for his birthday. Encouraged pt that the team is attempting to help him get home as quickly as possible and that no one is trying to keep him here as he stated. Pt calmed after several minutes and agreed to push w/c x 200 ft and remain in w/c at end of session. Pt was left with all needs in reach and alarm active.   Therapy Documentation Precautions:  Precautions Precautions: Fall Precaution Comments: Hypersentive and painful feet Restrictions Weight Bearing Restrictions: No    Therapy/Group: Individual Therapy  Mickel Fuchs 04/19/2021, 5:08 PM

## 2021-04-19 NOTE — Progress Notes (Addendum)
Patient ID: Bruce Hunter, male   DOB: Jan 21, 1976, 45 y.o.   MRN: [id number] PCS order faxed in to [facility] to begin work on getting for him. Spoke with Mom who feels pt needs SNF will talk with pt regarding in agreement with this.  1;40 PM Met with pt to discuss going to a SNF when medically stable from here. He feels with his inability to walk on his feet he has no other choice. He is in agreement but hopes if he gets better he can go home. Aware of PCS referral started

## 2021-04-19 NOTE — Progress Notes (Signed)
[facility] for Infectious Disease    Date of Admission:  03/30/2021   Total days of antibiotics 2   ID: Bruce Hunter is a 45 y.o. male with HIV disease, CD 4 count of 324/VL 240 (in July 2022) prolonged hospitalization and ICU myopathy now in acute rehab but intermittent fever, with presumed gout Principal Problem:   Intensive care (ICU) myopathy Active Problems:   AIDS (acquired immune deficiency syndrome) ([place])   Debility   Gout attack   Leukocytosis   Bilateral swelling of feet   AKI (acute kidney injury) ([place])   SIRS (systemic inflammatory response syndrome) ([facility])   Hyponatremia    Subjective: Had temp of 102.8F yesterday. Patient recalls having significant pain to right wrist at previous site of PIV with erythema and swelling of his wrist, now improved. He was started on vancomycin and unasyn.  He still reports swelling to lower extremities and tenderness when weight bearing to his feet  Medications:   (feeding supplement) PROSource Plus  30 mL Oral TID BM   clonazePAM  0.5 mg Oral TID   dapsone  100 mg Oral Daily   diclofenac Sodium  2 g Topical QID   dolutegravir  50 mg Oral Daily   furosemide  40 mg Oral Q M,W,F   gabapentin  600 mg Oral Q12H   hydrALAZINE  25 mg Oral Q8H   insulin aspart  0-15 Units Subcutaneous TID WC   insulin aspart  0-5 Units Subcutaneous QHS   insulin glargine-yfgn  5 Units Subcutaneous Daily   lamiVUDine  300 mg Oral Daily   lidocaine  1 patch Transdermal Q24H   lidocaine  2 patch Transdermal Q24H   magnesium oxide  400 mg Oral BID   metoprolol tartrate  25 mg Oral BID   multivitamin with minerals  1 tablet Oral Daily   pantoprazole  40 mg Oral Daily   tamsulosin  0.4 mg Oral QPC supper    Objective: Vital signs in last 24 hours: Temp:  [98.7 F (37.1 C)-102.9 F (39.4 C)] 98.7 F (37.1 C) (08/15 1400) Pulse Rate:  [83-124] 83 (08/15 1400) Resp:  [17-22] 17 (08/15 1400) BP: (127-145)/(62-94) 134/83 (08/15 1400) SpO2:   [95 %-100 %] 99 % (08/15 1400) Weight:  [83 kg] 83 kg (08/15 0703) Physical Exam  Constitutional: He is oriented to person, place, and time. He appears well-developed and well-nourished. No distress.  HENT:  Mouth/Throat: Oropharynx is clear and moist. No oropharyngeal exudate.  Cardiovascular: Normal rate, regular rhythm and normal heart sounds. Exam reveals no gallop and no friction rub.  No murmur heard.  Pulmonary/Chest: Effort normal and breath sounds normal. No respiratory distress. He has no wheezes.  Abdominal: Soft. Bowel sounds are normal. He exhibits no distension. There is no tenderness.  [id number] dorsum of hand tender to touch but improved erythema, slight swelling. Lower extremities = swelling to dorsum of foot  Lymphadenopathy:  He has no cervical adenopathy.  Neurological: He is alert and oriented to person, place, and time.  Skin: Skin is warm and dry. No rash noted. No erythema.  Psychiatric: He has a normal mood and affect. His behavior is normal.    Lab Results Recent Labs    04/17/21 0612 04/18/21 0629 04/19/21 1147  WBC 13.1*  --  21.0*  HGB 8.7*  --  8.3*  HCT 28.2*  --  27.2*  NA 133* 134* 133*  K 4.1 4.3 4.6  CL 100 101 100  CO2 22  21* 21*  BUN 49* 47* 54*  CREATININE 1.85* 1.54* 1.65*   Liver Panel Recent Labs    04/18/21 0629  ALBUMIN 2.9*   Erythrocyte Sedimentation Rate  No results found for: Leonard  Microbiology: reviewed Studies/Results: DG CHEST PORT 1 VIEW  Result Date: 04/18/2021 CLINICAL DATA:  Fevers EXAM: PORTABLE CHEST 1 VIEW COMPARISON:  04/14/2021 FINDINGS: Cardiac shadow is stable. Scarring is noted in the lungs bilaterally stable from the prior study. No focal infiltrate or sizable effusion is noted. No bony abnormality is seen. IMPRESSION: No active disease. Electronically Signed   By: Inez Catalina M.D.   On: 04/18/2021 23:50     Assessment/Plan: Isolated fever/leukocytosis = possibly due to phlebitis/cellulitis to  right wrist. Recommend warm pack. Will stop iv abtx and follow blood cx since he is clinically stable. Procalcitonin not elevated. No other signs or symptoms of localized infection. Will follow up on right wrist exam tomorrow. If appears to have erythema c/w cellulitis, can treat with cephalexin  Bilateral feet/ankle pain/swelling = some improvement with compression hose. Suspect that he has gout. Have ruled out gonorrhea on 8/11. Left ankle imaging concerning for possible gout flare. May be worth doing a short burst of pulse steroids to see if any improvement of symptoms.he was given pred '60mg'$  x 1 yesterday. Would recommend to doing for 3-4 days to see if noticeable improvement in ankle pain.  Hiv disease = continue on tivicay/descovy/dapsone    [facility] for Infectious Diseases Cell: [phone] Pager: [phone]  04/19/2021, 5:17 PM

## 2021-04-20 ENCOUNTER — Inpatient Hospital Stay (HOSPITAL_COMMUNITY): Payer: Medicaid Other

## 2021-04-20 LAB — CBC WITH DIFFERENTIAL/PLATELET
Abs Immature Granulocytes: 0.09 10*3/uL — ABNORMAL HIGH (ref 0.00–0.07)
Basophils Absolute: 0 10*3/uL (ref 0.0–0.1)
Basophils Relative: 0 %
Eosinophils Absolute: 0.1 10*3/uL (ref 0.0–0.5)
Eosinophils Relative: 1 %
HCT: 26.2 % — ABNORMAL LOW (ref 39.0–52.0)
Hemoglobin: 8.3 g/dL — ABNORMAL LOW (ref 13.0–17.0)
Immature Granulocytes: 1 %
Lymphocytes Relative: 6 %
Lymphs Abs: 1 10*3/uL (ref 0.7–4.0)
MCH: 30.3 pg (ref 26.0–34.0)
MCHC: 31.7 g/dL (ref 30.0–36.0)
MCV: 95.6 fL (ref 80.0–100.0)
Monocytes Absolute: 1.3 10*3/uL — ABNORMAL HIGH (ref 0.1–1.0)
Monocytes Relative: 9 %
Neutro Abs: 12.8 10*3/uL — ABNORMAL HIGH (ref 1.7–7.7)
Neutrophils Relative %: 83 %
Platelets: 306 10*3/uL (ref 150–400)
RBC: 2.74 MIL/uL — ABNORMAL LOW (ref 4.22–5.81)
RDW: 18.7 % — ABNORMAL HIGH (ref 11.5–15.5)
WBC: 15.4 10*3/uL — ABNORMAL HIGH (ref 4.0–10.5)
nRBC: 0 % (ref 0.0–0.2)

## 2021-04-20 LAB — GLUCOSE, CAPILLARY
Glucose-Capillary: 97 mg/dL (ref 70–99)
Glucose-Capillary: 99 mg/dL (ref 70–99)
Glucose-Capillary: 119 mg/dL — ABNORMAL HIGH (ref 70–99)
Glucose-Capillary: 134 mg/dL — ABNORMAL HIGH (ref 70–99)

## 2021-04-20 LAB — CYCLIC CITRUL PEPTIDE ANTIBODY, IGG/IGA: CCP Antibodies IgG/IgA: 4 units (ref 0–19)

## 2021-04-20 LAB — URIC ACID: Uric Acid, Serum: 9.8 mg/dL — ABNORMAL HIGH (ref 3.7–8.6)

## 2021-04-20 MED ORDER — PREDNISONE 20 MG PO TABS
40.0000 mg | ORAL_TABLET | Freq: Every day | ORAL | Status: DC
  Administered 2021-04-20 – 2021-04-24 (×5): 40 mg via ORAL
  Filled 2021-04-20 (×4): qty 2

## 2021-04-20 MED ORDER — SODIUM CHLORIDE 0.9 % IV SOLN
2.0000 g | INTRAVENOUS | Status: DC
  Administered 2021-04-20 – 2021-04-21 (×2): 2 g via INTRAVENOUS
  Filled 2021-04-20 (×3): qty 20

## 2021-04-20 NOTE — Progress Notes (Signed)
Pt having pain, complaining about having to use restroom every 15-20 mins, and then when about to go into scanner said he was claustrophobic and refused to proceed with study.

## 2021-04-20 NOTE — Progress Notes (Signed)
Patient is A&O x 4 and able to make his needs known. Patient complains of joint pain and discomfort  throughout the day. +4 pitting edema to BLE, bilat wrist edema, and BL elbow edema. Patient has also had increased B/P and temperature. Dan NP notified of increased vitals. No new orders at this time. Red maws triggered and V/S obtained as ordered. Patient has an MRI ordered for R elbow., and needle aspiration of right elbow.  Appetite has been poor throughout the day, encouraged fluids and snacks to maintain BS WNL.  Call light within reach. Personal items within reach. Safety maintained.

## 2021-04-20 NOTE — Progress Notes (Signed)
IR was requested for image guided aspiration of right elbow effusion by ID.   Elbow aspiration is normally done by diagnostic radiologist, not interventional radiologist.  Order was changed to fluoro and asked diagnostic team to arrange this exam to be done this week when a radiologist is available for elbow effusion aspiration.   Case was reviewed by Dr. Dwaine Gale as well, who recommends MRI of right elbow for further evaluation, aspiration may be done in IR if the effusion is superficial but will need an orthopedic consult if the effusion is in the joint space.   Ordering provider notified via secure chat.  Provided the ordering provider the [facility] number to discuss the case with [facility] radiologist as well.    Ynez Eugenio H Wilho Sharpley PA-C 04/20/2021 1:25 PM

## 2021-04-20 NOTE — Progress Notes (Signed)
Nutrition Follow-up  DOCUMENTATION CODES:   Not applicable  INTERVENTION:  Continue 30 ml ProSource Plus po TID, each supplement provides 100 kcal and 15 grams of protein.     Continue Magic cup TID with meals, each supplement provides 290 kcal and 9 grams of protein.   Continue MVI with minerals daily.   Continue nourishment snacks. RD ordered.   NUTRITION DIAGNOSIS:   Increased nutrient needs related to chronic illness (HIV) as evidenced by estimated needs; ongoing  GOAL:   Patient will meet greater than or equal to 90% of their needs; progressing  MONITOR:   PO intake, Supplement acceptance, Labs, Weight trends, Skin, I & O's  REASON FOR ASSESSMENT:   Malnutrition Screening Tool    ASSESSMENT:   45 year-old male with medical history of recently diagnosed HIV/AIDS and non-compliant with HAART, DM, asthma, HLD, HTN, and gout who presents with ICU myopathy.  Meal completion has been 100%. Pt tolerating his PO diet. Pt currently has Prosource plus and nourishment snacks ordered and has been consuming them. RD to continue with current orders to aid in caloric and protein needs.   Labs and medications reviewed.   Diet Order:   Diet Order             Diet Carb Modified Fluid consistency: Thin; Room service appropriate? Yes  Diet effective now                   EDUCATION NEEDS:   Education needs have been addressed  Skin:  Skin Assessment: Reviewed RN Assessment Skin Integrity Issues:: Stage II, Other (Comment) Stage II: N/A Other: N/A  Last BM:  8/16  Height:   Ht Readings from Last 1 Encounters:  03/30/21 '5\' 10"'$  (1.778 m)    Weight:   Wt Readings from Last 1 Encounters:  04/20/21 91.7 kg   BMI:  Body mass index is 29.01 kg/m.  Estimated Nutritional Needs:   Kcal:  2400-2600  Protein:  130-145 grams  Fluid:  >2.4 L  Corrin Parker, MS, RD, LDN RD pager number/after hours weekend pager number on Amion.

## 2021-04-20 NOTE — Progress Notes (Signed)
   04/20/21 1314  Assess: MEWS Score  Temp (!) 102.4 F (39.1 C)  BP (!) 161/86  Pulse Rate (!) 124  Resp 19  SpO2 100 %  O2 Device Room Air  Assess: MEWS Score  MEWS Temp 2  MEWS Systolic 0  MEWS Pulse 2  MEWS RR 0  MEWS LOC 0  MEWS Score 4  MEWS Score Color Red  Assess: if the MEWS score is Yellow or Red  Were vital signs taken at a resting state? Yes  Focused Assessment Change from prior assessment (see assessment flowsheet)  Does the patient meet 2 or more of the SIRS criteria? Yes  Does the patient have a confirmed or suspected source of infection? Yes  Provider and Rapid Response Notified? No  Early Detection of Sepsis Score *See Row Information* High  MEWS guidelines implemented *See Row Information* No, altered LOC is baseline  Treat  MEWS Interventions Administered scheduled meds/treatments;Administered prn meds/treatments  Pain Scale 0-10  Pain Score 7  Pain Location Generalized  Pain Frequency Constant  Pain Onset On-going  Patients Stated Pain Goal 2  Pain Intervention(s) Medication (See eMAR);Repositioned;Food  Breathing 1  Negative Vocalization 0  Facial Expression 1  Body Language 1  Consolability 1  PAINAD Score 4  Facial Expression 1  Body Movements 2  Muscle Tension 1  Take Vital Signs  Increase Vital Sign Frequency  Red: Q 1hr X 4 then Q 4hr X 4, if remains red, continue Q 4hrs  Escalate  MEWS: Escalate Red: discuss with charge nurse/RN and provider, consider discussing with RRT  Notify: Charge Nurse/RN  Name of Charge Nurse/RN Notified Dauda  Date Charge Nurse/RN Notified 04/20/21  Time Charge Nurse/RN Notified 83  Notify: Provider  Provider Name/Title Dan PA  Date Provider Notified 04/20/21  Time Provider Notified 1400  Notification Type Face-to-face  Notification Reason Change in status  Provider response Other (Comment) (Infectious disease consult)  Date of Provider Response 04/20/21  Time of Provider Response 1400  Document   Patient Outcome Not stable and remains on department  Progress note created (see row info) Yes

## 2021-04-20 NOTE — Progress Notes (Signed)
PROGRESS NOTE   Subjective/Complaints: Pt c/o bilateral ffot pain , "since I woke up from coma" Denies sweats or chills  Appreciate ID note , pt has no RIght wrist pain today but c/o bilateral foot and ankle pain    ROS:  Pt denies SOB, abd pain, CP, N/V/C/D, and vision changes   Objective:   DG CHEST PORT 1 VIEW  Result Date: 04/18/2021 CLINICAL DATA:  Fevers EXAM: PORTABLE CHEST 1 VIEW COMPARISON:  04/14/2021 FINDINGS: Cardiac shadow is stable. Scarring is noted in the lungs bilaterally stable from the prior study. No focal infiltrate or sizable effusion is noted. No bony abnormality is seen. IMPRESSION: No active disease. Electronically Signed   By: Inez Catalina M.D.   On: 04/18/2021 23:50   Recent Labs    04/19/21 1147  WBC 21.0*  HGB 8.3*  HCT 27.2*  PLT 277      Recent Labs    04/18/21 0629 04/19/21 1147  NA 134* 133*  K 4.3 4.6  CL 101 100  CO2 21* 21*  GLUCOSE 124* 112*  BUN 47* 54*  CREATININE 1.54* 1.65*  CALCIUM 9.9 9.8     Intake/Output Summary (Last 24 hours) at 04/20/2021 0824 Last data filed at 04/20/2021 0500 Gross per 24 hour  Intake 472 ml  Output 2675 ml  Net -2203 ml          Physical Exam: Vital Signs Blood pressure 125/78, pulse 100, temperature 99.1 F (37.3 C), temperature source Oral, resp. rate 18, height '5\' 10"'$  (1.778 m), weight 91.7 kg, SpO2 99 %.     General: No acute distress Mood and affect are appropriate Heart: Regular rate and rhythm no rubs murmurs or extra sounds Lungs: Clear to auscultation, breathing unlabored, no rales or wheezes Abdomen: Positive bowel sounds, soft nontender to palpation, nondistended Extremities: No clubbing, cyanosis, + pedal edema, popliteal pulses normal pt does not tolerate palpation of DP or PT  Skin: No evidence of breakdown, no evidence of rash Neurologic: Cranial nerves II through XII intact, motor strength is 4/5 in right and  5/5 left deltoid, bicep, tricep, grip, 4/5 B hip flexor, knee extensors, trace ankle dorsiflexor and plantar flexor (due to pain with active foot/ankle movement    Musc: feet down to 1+ edema this AM and trace to zero in legs B/L however VERY TTP over dorsum of feet B/L- Neuro: Alert Motor: Grossly intact, some limitations due to pain in bilateral feet  Assessment/Plan: 1. Functional deficits which require 3+ hours per day of interdisciplinary therapy in a comprehensive inpatient rehab setting. Physiatrist is providing close team supervision and 24 hour management of active medical problems listed below. Physiatrist and rehab team continue to assess barriers to discharge/monitor patient progress toward functional and medical goals  Care Tool:  Bathing    Body parts bathed by patient: Right arm, Left arm, Chest, Abdomen, Right upper leg, Left upper leg, Face, Front perineal area, Right lower leg, Left lower leg, Buttocks   Body parts bathed by helper: Buttocks, Right lower leg, Left lower leg     Bathing assist Assist Level: Independent with assistive device (bedlevel, clinical judgement)     Upper Body  Dressing/Undressing Upper body dressing   What is the patient wearing?: Pull over shirt    Upper body assist Assist Level: Independent with assistive device (bedlevel, clinical judgement)    Lower Body Dressing/Undressing Lower body dressing      What is the patient wearing?: Underwear/pull up, Pants     Lower body assist Assist for lower body dressing: Independent with assitive device (bedlevel, clinical judgement)     Toileting Toileting    Toileting assist Assist for toileting: Independent with assistive device (per PT)     Transfers Chair/bed transfer  Transfers assist     Chair/bed transfer assist level: Contact Guard/Touching assist Chair/bed transfer assistive device: Programmer, multimedia   Ambulation assist   Ambulation activity did not  occur: Refused  Assist level: Supervision/Verbal cueing Assistive device: Walker-rolling Max distance: 300 ft   Walk 10 feet activity   Assist  Walk 10 feet activity did not occur: Refused  Assist level: Independent with assistive device Assistive device: Walker-rolling   Walk 50 feet activity   Assist Walk 50 feet with 2 turns activity did not occur: Refused  Assist level: Independent with assistive device Assistive device: Walker-rolling    Walk 150 feet activity   Assist Walk 150 feet activity did not occur: Refused  Assist level: Supervision/Verbal cueing Assistive device: Walker-rolling    Walk 10 feet on uneven surface  activity   Assist Walk 10 feet on uneven surfaces activity did not occur: Safety/medical concerns   Assist level: Supervision/Verbal cueing Assistive device: Aeronautical engineer Will patient use wheelchair at discharge?: Yes Type of Wheelchair: Manual Wheelchair activity did not occur: Refused (Pt stated he would not leave his room)  Wheelchair assist level: Independent Max wheelchair distance: 150 ft    Wheelchair 50 feet with 2 turns activity    Assist    Wheelchair 50 feet with 2 turns activity did not occur: Refused   Assist Level: Independent   Wheelchair 150 feet activity     Assist  Wheelchair 150 feet activity did not occur: Refused   Assist Level: Independent   Blood pressure 125/78, pulse 100, temperature 99.1 F (37.3 C), temperature source Oral, resp. rate 18, height '5\' 10"'$  (1.778 m), weight 91.7 kg, SpO2 99 %.  Medical Problem List and Plan: 1.  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  Impaired mobility, was ambulating >200 feet: d/c Lovenox  Patient doesn't want lovenox.  3. Left shoulder/back/foot pain/Pain/diabetic peripheral neuropathy: continue Oxycodone prn. Increase steroids for gout flare. Will do  60 mg daily x 2 days, then taper. Pt rates pain as "11/10". Needs to bring in shoes so able to walk better; If steroids PO doesn't work, might need L shoulder injection. Pt hates needles.   8/1- added Lidoderm patches for Shoulder and back pain. 8pm to 8am  8/6 increase gabapentin to '300mg'$  TID, increase to 600 3 times daily on 8/13 Increase Oxy to 15 mg every 4 as needed  Prednisone 60x1 ordered on 8/13 8/15- pain is uncontrolled per pt- wait on Gout/colchicine with ID w/u in progress- and can add this afternoon/tomorrow if needed.  8/16 tenosynovitis and probable gouty arthritis, check urate, agree with ID will need steroid resumed, will restart taper at '40mg'$  and taper more slowly, ?rheum eval as OP  Pain with light palpation may have some element of neuropathic pain  4. Mood: LCSW  to follow for evaluation and support.              -antipsychotic agents: N/A 5. Neuropsych: This patient is capable of making decisions on his own behalf. 6. Skin/Wound Care: Routine pressure relief measures.  7. Fluids/Electrolytes/Nutrition: Strict I/Os 8. PJP PNA/leukocytosis:  Duration of meds discussed with pharmacy who relayed that patient has completed 21+ days of steroids and primaquine. --Clindamycin thorough 07/29 to complete 21 days. ID consulted, appreciate recs-recommended VIR evaluation.   VIR are recommending foot ultrasound, order placed 9. Gout flare: Was on colchicine 0.6 mg daily PTA. --prednisone taper completed 8/9- colchicine dose x2 again to see if will help feet pain.  10. T2DM: Hgb A1c- 5.4. Was on glucotrol/metformin bid PTA.             --continue Lantus while on steroids with SSI for elevated BS.  Labile on 8/14, monitor for trend  8/15- BG's usually controlled but had 1 episode of 248 this AM- will monitor 11. Anxiety d/o: Was on Klonopin TID PTA. --bid effective at this time. 12. HTN: Monitor BP tid--continue hydralazine and Norvasc.  Relatively controlled on 8/14 13. HIV: On  Epivir and Tivicay 14. Anemia: H/H steadily improving.   Hemoglobin 8.7 on 8/13 15. Hypomagnesemia: MgOx 400 mg BID  Magnesium 1.7 on 8/9 16. Leukocytosis Appreciate ID recs-ordered Rh factor and quantiferon TB testing- also GC swabs  WBCs 13.1 on 8/13, labs ordered for tomorrow  Fevers resolved  Await further ID recs  Now afebrile , elevated WBC may be related to steroids - ID to follow up  17. Dysphagia: Resolved 18. Urinary retention-resolved 19. Bowel incontinence- Improved 20. Chipped nails-?  Improving.  21. GERD  87/28-restarted Protonix- also has tums prn. 22. Pressure injury to buttocks: offload q29mnutes 23. Bilateral lower extremity swelling: ice, elevated, compression garments 24. Shoulder pain: continue lidocaine patch and voltaren gel.  25. Nearsighted- schedule outpatient ophthalmology eval- should be checked for diabetic retinopathy 26. Diabetic peripheral neuropathy: will benefit from outpatient Qutenza. Ordered diabetic shoes  -gabapentin increased as above  Mildly elevated on 8/13 27. Lower extremity edema:resumed '10mg'$  daily Lasix.  28. AKI:   Creatinine 1.54 on 8/14, exacerbated by diuretics  8/15- labs pending this AM 29.  Mild hyponatremia  Sodium 134 on 8/14, continue to monitor    LOS: 21 days A FACE TO FSantiagoE Shaia Hunter 04/20/2021, 8:24 AM

## 2021-04-20 NOTE — Progress Notes (Signed)
Physical Therapy Session Note  Patient Details  Name: Bruce Hunter MRN: [id number] Date of Birth: 1976/02/10  Today's Date: 04/20/2021 PT Missed Time: 11 Minutes Missed Time Reason: Pain (pt trembling in pain on arrival)  Short Term Goals: Week 1:  PT Short Term Goal 1 (Week 1): =LTGs d/t ELOS PT Short Term Goal 1 - Progress (Week 1): Met PT Short Term Goal 2 (Week 1): Pt will tolerate x 30 minutes of activity PT Short Term Goal 2 - Progress (Week 1): Met PT Short Term Goal 3 (Week 1): Pt will tolerate sitting OOB for 30 + minutes PT Short Term Goal 3 - Progress (Week 1): Met  Skilled Therapeutic Interventions/Progress Updates:    Pt trembling in pain on arrival, perseverating/repeating "in so much pain." Pt remained in bed with phlebotomy present to take labs. Pt missed 30 min skilled PT intervention d/t pain.    Therapy Documentation Precautions:  Precautions Precautions: Fall Precaution Comments: Hypersentive and painful feet Restrictions Weight Bearing Restrictions: No General: PT Amount of Missed Time (min): 30 Minutes PT Missed Treatment Reason: Pain (pt trembling in pain on arrival)     Therapy/Group: Individual Therapy  Mickel Fuchs 04/20/2021, 11:14 AM

## 2021-04-20 NOTE — Progress Notes (Signed)
[facility] for Infectious Disease    Date of Admission:  03/30/2021   Total days of antibiotics 3/day 1 ceftriaxone           ID: Bruce Hunter is a 45 y.o. male with HIV disease  Principal Problem:   Intensive care (ICU) myopathy Active Problems:   AIDS (acquired immune deficiency syndrome) ([place])   Debility   Gout attack   Leukocytosis   Bilateral swelling of feet   AKI (acute kidney injury) (HCC)   SIRS (systemic inflammatory response syndrome) (HCC)   Hyponatremia    Subjective: Fever this morning with rigors up to 102F. Right elbow more pain and swelling in addition to still ongoing pain to his bilateral feet  Medications:   (feeding supplement) PROSource Plus  30 mL Oral TID BM   clonazePAM  0.5 mg Oral TID   dapsone  100 mg Oral Daily   diclofenac Sodium  2 g Topical QID   dolutegravir  50 mg Oral Daily   furosemide  40 mg Oral Q M,W,F   gabapentin  600 mg Oral Q12H   hydrALAZINE  25 mg Oral Q8H   insulin aspart  0-15 Units Subcutaneous TID WC   insulin aspart  0-5 Units Subcutaneous QHS   insulin glargine-yfgn  5 Units Subcutaneous Daily   lamiVUDine  300 mg Oral Daily   lidocaine  1 patch Transdermal Q24H   lidocaine  2 patch Transdermal Q24H   magnesium oxide  400 mg Oral BID   metoprolol tartrate  25 mg Oral BID   multivitamin with minerals  1 tablet Oral Daily   pantoprazole  40 mg Oral Daily   predniSONE  40 mg Oral Q breakfast   tamsulosin  0.4 mg Oral QPC supper    Objective: Vital signs in last 24 hours: Temp:  [98.5 F (36.9 C)-102.4 F (39.1 C)] 100.3 F (37.9 C) (08/16 1530) Pulse Rate:  [86-128] 128 (08/16 1530) Resp:  [16-20] 20 (08/16 1530) BP: (125-161)/(78-86) 141/84 (08/16 1530) SpO2:  [99 %-100 %] 100 % (08/16 1530) Weight:  [91.7 kg-96.1 kg] 96.1 kg (08/16 1454)  Physical Exam  Constitutional: He is oriented to person, place, and time. He appears well-developed and well-nourished. In mild distress.  HENT:  Mouth/Throat:  Oropharynx is clear and moist. No oropharyngeal exudate.  Cardiovascular: Normal rate, regular rhythm and normal heart sounds. Exam reveals no gallop and no friction rub.  No murmur heard.  Pulmonary/Chest: Effort normal and breath sounds normal. No respiratory distress. He has no wheezes.  Abdominal: Soft. Bowel sounds are normal. He exhibits no distension. There is no tenderness.  Lymphadenopathy:  He has no cervical adenopathy.  Ext: right elbow swelling. Tender right wrist nodule. Bilateral dorsum of feet tenderness Neurological: He is alert and oriented to person, place, and time.  Skin: Skin is warm and dry. No rash noted. No erythema.  Psychiatric: He has a normal mood and affect. His behavior is normal.    Lab Results Recent Labs    04/18/21 0629 04/19/21 1147 04/20/21 1011  WBC  --  21.0* 15.4*  HGB  --  8.3* 8.3*  HCT  --  27.2* 26.2*  NA 134* 133*  --   K 4.3 4.6  --   CL 101 100  --   CO2 21* 21*  --   BUN 47* 54*  --   CREATININE 1.54* 1.65*  --    Liver Panel Recent Labs    04/18/21 [id number]  ALBUMIN 2.9*    Microbiology: reviewed Studies/Results: DG CHEST PORT 1 VIEW  Result Date: 04/18/2021 CLINICAL DATA:  Fevers EXAM: PORTABLE CHEST 1 VIEW COMPARISON:  04/14/2021 FINDINGS: Cardiac shadow is stable. Scarring is noted in the lungs bilaterally stable from the prior study. No focal infiltrate or sizable effusion is noted. No bony abnormality is seen. IMPRESSION: No active disease. Electronically Signed   By: Inez Catalina M.D.   On: 04/18/2021 23:50     Assessment/Plan: Ongoing fevers = appears high for disseminated gout. Will get mri of right elbow per radiology, to see if can aspirate effusion. Will continue with steroids to see if any improvement with his feet since initially presumed this is gouty flare  -if ongoing fevers, will check CMV viral load, AFB blood culture (disseminated MAC), consider chest/abdomen/pelvis CT scan for concern of infection being  unmasked due to immune reconstitution  Hiv disease = continue on tivicay and descovy  [facility] for Infectious Diseases Cell: [phone] Pager: [phone]  04/20/2021, 4:45 PM

## 2021-04-20 NOTE — Patient Care Conference (Signed)
Inpatient RehabilitationTeam Conference and Plan of Care Update Date: 04/20/2021   Time: 11:45 AM   Patient Name: Bruce Hunter      Medical Record Number: [id number]  Date of Birth: 27-Jan-1976 Sex: Male         Room/Bed: 4M09C/4M09C-01 Payor Info: Payor: [place] / Plan: [place] MEDICAID [facility] / Product Type: *No Product type* /    Admit Date/Time:  03/30/2021  2:52 PM  Primary Diagnosis:  Intensive care (ICU) myopathy  Hospital Problems: Principal Problem:   Intensive care (ICU) myopathy Active Problems:   AIDS (acquired immune deficiency syndrome) ([place])   Debility   Gout attack   Leukocytosis   Bilateral swelling of feet   AKI (acute kidney injury) ([place])   SIRS (systemic inflammatory response syndrome) ([place])   Hyponatremia    Expected Discharge Date: Expected Discharge Date: 04/22/21  Team Members Present: Physician leading conference: Dr. Alger Simons Social Worker Present: Ovidio Kin, LCSW Nurse Present: Dorthula Nettles, RN PT Present: Ailene Rud, PT OT Present: Lillia Corporal, OT PPS Coordinator present : Gunnar Fusi, SLP     Current Status/Progress Goal Weekly Team Focus  Bowel/Bladder   pt is cont of bowel and bladder. LBM 04/20/21  Pt to remain cont of B/B  [place] pt as needed   Swallow/Nutrition/ Hydration             ADL's   Per CAREtool, Mod I from wheelchair/bed level for bathing/dressing, Supervision for squat pivot transfers  Mod I  standing balance/tolerance, pain management, ADL retraining, global endurance, core/sitting balance   Mobility   mod I scoot/squat pivot transfers, mod I w/c mobility, limited by pain  mod I chair bed transfers  transfers for safe d/c   Communication             Safety/Cognition/ Behavioral Observations            Pain   Pt states pain 10/10 on pain scale in BLE.  Pt pain to be < 3 on pain scale  Assess for pain qshift and provide PRN medications as needed   Skin   Pt skin is clean and  intact.  Pt skin to remain clean and intact  Assess skin qshift and provide skin care.     Discharge Planning:  Pt feels now cant go home like this and wants to pursue SNF, aware of no therapies due to medicaid. Feels has to be walking before goes home   Team Discussion: CBG's labile, ID following. Continent B/B, PRN's available for complaints of pain. BLE edema. Mod I bathing and dressing, EOB, bed level bathing. Supervision squat pivot transfers. Mod I goals. Scoot and squat pivot transfers due to pain in feet. Mod I goals at [facility] level.  Patient on target to meet rehab goals: yes  *See Care Plan and progress notes for long and short-term goals.   Revisions to Treatment Plan:  Continue to monitor pain in feet.  Teaching Needs: Family education, medication management, pain management, skin/wound care, transfer training, gait training, balance training, endurance training, safety awareness.  Current Barriers to Discharge: Decreased caregiver support, Medical stability, Home enviroment access/layout, Wound care, Lack of/limited family support, Weight, and Medication compliance  Possible Resolutions to Barriers: Continue current medications, provide emotional support.     Medical Summary Current Status: gout flares in feet, steroid taper, ID following, low grade temp. diabetes mgt ongoing.  Barriers to Discharge: Medical stability   Possible Resolutions to Barriers/Weekly Focus: rx of  gout/ID issues. daily assessment of pt data/VS   Continued Need for Acute Rehabilitation Level of Care: The patient requires daily medical management by a physician with specialized training in physical medicine and rehabilitation for the following reasons: Direction of a multidisciplinary physical rehabilitation program to maximize functional independence : Yes Medical management of patient stability for increased activity during participation in an intensive rehabilitation regime.: Yes Analysis of  laboratory values and/or radiology reports with any subsequent need for medication adjustment and/or medical intervention. : Yes   I attest that I was present, lead the team conference, and concur with the assessment and plan of the team.   Cristi Loron 04/20/2021, 4:51 PM

## 2021-04-20 NOTE — Progress Notes (Signed)
MRI tech reported to this nurse that patient was unable to tolerate MRI exam and refused to proceed with the study. Patient was making c/o urinating q 15 minutes, pain, and stated that he is claustrophobic and could not enter MRI machine. Nurse administered patient's scheduled Klonopin 0.5 mg at 2150 previous to exam. Patient arrived back on unit and was assisted with using urinal. Patient stated to nurse "I take Lasix and I have to pee a lot so maybe we need to wait until this is out of my system." Nurse asked patient if he had an MRI exam previously in the past and patient stated "Yes but I only had to put my feet in. They need me to lay down to go in that machine and I'm claustrophobic, y'all will have to give me something to knock me out." Nurse administered prn pain medication per patient request. Bed in low position and call button within reach. Will report patient refusal of MRI to oncoming shift and provider.

## 2021-04-20 NOTE — Progress Notes (Signed)
Occupational Therapy Session Note  Patient Details  Name: ALDIN DREES MRN: [id number] Date of Birth: [birth year]  Today's Date: 04/20/2021 OT Individual Time: 7877-6548 OT Individual Time Calculation (min): 10 min  and Today's Date: 04/20/2021 OT Missed Time: 20 Minutes Missed Time Reason: Pain   Short Term Goals: Week 1:  OT Short Term Goal 1 (Week 1): ELOS=STGs due to ELOS OT Short Term Goal 1 - Progress (Week 1): Met OT Short Term Goal 2 (Week 1): Pt will perform LB dress with Mod A at sit > stand OT Short Term Goal 2 - Progress (Week 1): Met OT Short Term Goal 3 (Week 1): Pt will perform ADL routine with no more than Min cues for sequencing/problem solving OT Short Term Goal 3 - Progress (Week 1): Met OT Short Term Goal 4 (Week 1): Pt will complete ADL routine/activity of choice for 10 minutes without fatigue OT Short Term Goal 4 - Progress (Week 1): Met Week 2:  OT Short Term Goal 1 (Week 2): STGs = LTGs at Mod I  Skilled Therapeutic Interventions/Progress Updates:    Pt greeted at time of session on [facility] with RN in room as well, c/o excruciating pain in BLEs especially in feet. Pt noted to also look lethargic, difficulty pronouncing speech as well. Pt stating he had already performed hygiene and wanting to get back to bed, and attempting to roll himself back to bed. Told not to do so, but unable to perform squat pivot despite attempts, telling therapist "not to help." Eventually pt agreeable to help, therapist performing Max A squat pivot BSC > bed with over the back technique and pt rolling bed level to don pants over hips. RN present for entire session, going to take vitals. Hand off to nursing. Missed 20 mins d/t pain and feeling ill.    Therapy Documentation Precautions:  Precautions Precautions: Fall Precaution Comments: Hypersentive and painful feet Restrictions Weight Bearing Restrictions: No    Therapy/Group: Individual Therapy  [name] 04/20/2021, 3:43 PM

## 2021-04-21 ENCOUNTER — Other Ambulatory Visit (HOSPITAL_COMMUNITY): Payer: Self-pay

## 2021-04-21 ENCOUNTER — Encounter: Payer: Medicaid Other | Admitting: Physical Medicine and Rehabilitation

## 2021-04-21 DIAGNOSIS — M25572 Pain in left ankle and joints of left foot: Secondary | ICD-10-CM

## 2021-04-21 DIAGNOSIS — M25571 Pain in right ankle and joints of right foot: Secondary | ICD-10-CM

## 2021-04-21 LAB — GLUCOSE, CAPILLARY
Glucose-Capillary: 111 mg/dL — ABNORMAL HIGH (ref 70–99)
Glucose-Capillary: 149 mg/dL — ABNORMAL HIGH (ref 70–99)
Glucose-Capillary: 153 mg/dL — ABNORMAL HIGH (ref 70–99)
Glucose-Capillary: 210 mg/dL — ABNORMAL HIGH (ref 70–99)

## 2021-04-21 LAB — URIC ACID: Uric Acid, Serum: 10.4 mg/dL — ABNORMAL HIGH (ref 3.7–8.6)

## 2021-04-21 LAB — SEDIMENTATION RATE: Sed Rate: 140 mm/hr — ABNORMAL HIGH (ref 0–16)

## 2021-04-21 MED ORDER — SODIUM CHLORIDE 0.9 % IV SOLN: INTRAVENOUS | Status: DC

## 2021-04-21 NOTE — Progress Notes (Signed)
   04/21/21 1112  Assess: MEWS Score  Level of Consciousness Alert  Assess: MEWS Score  MEWS Temp 0  MEWS Systolic 0  MEWS Pulse 2  MEWS RR 0  MEWS LOC 0  MEWS Score 2  MEWS Score Color Yellow  Assess: if the MEWS score is Yellow or Red  Were vital signs taken at a resting state? Yes  Focused Assessment No change from prior assessment  Does the patient meet 2 or more of the SIRS criteria? No  Does the patient have a confirmed or suspected source of infection? No  Provider and Rapid Response Notified? No  Early Detection of Sepsis Score *See Row Information* Low  MEWS guidelines implemented *See Row Information* No, previously yellow, continue vital signs every 4 hours  Treat  Pain Scale 0-10  Pain Score 3  Pain Type Chronic pain  Pain Location Foot  Pain Descriptors / Indicators Aching;Restless;Constant  Patients Stated Pain Goal 2  Pain Intervention(s) Medication (See eMAR);Rest;Pain med given for lower pain score than stated, per patient request;Repositioned  Multiple Pain Sites Yes  Breathing 0  Negative Vocalization 0  Facial Expression 0  Body Language 0  Consolability 0  PAINAD Score 0  Facial Expression 0  Body Movements 0  Muscle Tension 0  Compliance with ventilator (intubated pts.) N/A  Vocalization (extubated pts.) N/A  CPOT Total 0  Interventions Reposition;Relaxation;Medication (see MAR) (Q 4 hours VS)  2nd Pain Site  Pain Score 3  Pain Type Chronic pain  Pain Location Back  Pain Orientation Lower  Pain Descriptors / Indicators Aching  Pain Frequency Intermittent  Pain Onset Gradual  Patient's Stated Pain Goal 2  Pain Intervention(s) Medication (See eMAR);Guided imagery;Rest;Back rub  3rd Pain Site  Pain Score 3  Pain Location Elbow  Pain Descriptors / Indicators Heaviness;Cramping;Discomfort  Pain Onset Gradual  Pain Frequency Intermittent  Pain Intervention(s) Medication (See eMAR);MD notified (Comment);Back rub  Notify: Provider  Provider  response  (Q 4 hours VS)  Date of Provider Response 04/21/21

## 2021-04-21 NOTE — Progress Notes (Signed)
[facility] for Infectious Disease    Date of Admission:  03/30/2021   Total days of antibiotics 4   ID: JOURNEE LICO is a 45 y.o. male with   Principal Problem:   Intensive care (ICU) myopathy Active Problems:   AIDS (acquired immune deficiency syndrome) (HCC)   Debility   Gout attack   Leukocytosis   Bilateral swelling of feet   AKI (acute kidney injury) (HCC)   SIRS (systemic inflammatory response syndrome) (HCC)   Hyponatremia   Pain in joints of both feet    Subjective: No longer febrile this morning. Still having right elbow pain, unable to extend arm completely. Still having bilateral feet swelling left ankle pain> right ankle pain.  Medications:   (feeding supplement) PROSource Plus  30 mL Oral TID BM   clonazePAM  0.5 mg Oral TID   dapsone  100 mg Oral Daily   diclofenac Sodium  2 g Topical QID   dolutegravir  50 mg Oral Daily   furosemide  40 mg Oral Q M,W,F   gabapentin  600 mg Oral Q12H   hydrALAZINE  25 mg Oral Q8H   insulin aspart  0-15 Units Subcutaneous TID WC   insulin aspart  0-5 Units Subcutaneous QHS   insulin glargine-yfgn  5 Units Subcutaneous Daily   lamiVUDine  300 mg Oral Daily   lidocaine  1 patch Transdermal Q24H   lidocaine  2 patch Transdermal Q24H   magnesium oxide  400 mg Oral BID   metoprolol tartrate  25 mg Oral BID   multivitamin with minerals  1 tablet Oral Daily   pantoprazole  40 mg Oral Daily   predniSONE  40 mg Oral Q breakfast   tamsulosin  0.4 mg Oral QPC supper    Objective: Vital signs in last 24 hours: Temp:  [98.3 F (36.8 C)-101.4 F (38.6 C)] 98.5 F (36.9 C) (08/17 0947) Pulse Rate:  [103-128] 116 (08/17 0947) Resp:  [17-20] 17 (08/17 0947) BP: (111-158)/(73-91) 137/84 (08/17 0947) SpO2:  [96 %-100 %] 100 % (08/17 0947) Weight:  [96.1 kg-98.2 kg] 98.2 kg (08/17 [id number])  Physical Exam  Constitutional: He is oriented to person, place, and time. He appears well-developed and well-nourished. No distress.   HENT:  Mouth/Throat: Oropharynx is clear and moist. No oropharyngeal exudate.  Cardiovascular: Normal rate, regular rhythm and normal heart sounds. Exam reveals no gallop and no friction rub.  No murmur heard.  Pulmonary/Chest: Effort normal and breath sounds normal. No respiratory distress. He has no wheezes.  Abdominal: Soft. Bowel sounds are normal. He exhibits no distension. There is no tenderness.  Ext: dorsum of feet swollen. No erythema. Unable to extend right elbow due to pain/effusion Neurological: He is alert and oriented to person, place, and time.  Skin: Skin is warm and dry. No rash noted. No erythema.  Psychiatric: He has a normal mood and affect. His behavior is normal.    Lab Results Recent Labs    04/19/21 1147 04/20/21 1011  WBC 21.0* 15.4*  HGB 8.3* 8.3*  HCT 27.2* 26.2*  NA 133*  --   K 4.6  --   CL 100  --   CO2 21*  --   BUN 54*  --   CREATININE 1.65*  --      Microbiology: reviewed Studies/Results: No results found.   Assessment/Plan: Polyarticular swelling/pain = suspected to have gouty flare, however having high fevers concern for underlying infection. Recommend to get ortho to see if  can aspirate right elbow. Unable to get mri yesterday due to anxiety - for now continue on pred '40mg'$  daily to see if it improves symptoms  Fevers = repeat blood cx are negative. Recommend to decrease tylenol use to see if it is masking fever curve  Hiv disease= will check cd 4 count and hiv VL  Oi proph = continue with dapsone  [facility] for Infectious Diseases Cell: [phone] Pager: [phone]  04/21/2021, 1:40 PM

## 2021-04-21 NOTE — Progress Notes (Signed)
PROGRESS NOTE   Subjective/Complaints: Patient seen laying in bed this morning.  He states he slept fairly overnight.  He notes some improvement in feet pain.  He was seen by radiology yesterday, notes reviewed-recommending MRI of right shoulder.  He was seen by ID yesterday, notes reviewed-suspecting disseminated gout, with further work-up possibly.  Patient refused MRI yesterday.  ROS: Denies CP, SOB, N/V/D  Objective:   No results found. Recent Labs    04/19/21 1147 04/20/21 1011  WBC 21.0* 15.4*  HGB 8.3* 8.3*  HCT 27.2* 26.2*  PLT 277 306      Recent Labs    04/19/21 1147  NA 133*  K 4.6  CL 100  CO2 21*  GLUCOSE 112*  BUN 54*  CREATININE 1.65*  CALCIUM 9.8     Intake/Output Summary (Last 24 hours) at 04/21/2021 1019 Last data filed at 04/21/2021 0805 Gross per 24 hour  Intake 118 ml  Output 2350 ml  Net -2232 ml          Physical Exam: Vital Signs Blood pressure 137/84, pulse (!) 116, temperature 98.5 F (36.9 C), temperature source Oral, resp. rate 17, height '5\' 10"'$  (1.778 m), weight 98.2 kg, SpO2 100 %. Constitutional: No distress . Vital signs reviewed. HENT: Normocephalic.  Atraumatic. Eyes: EOMI. No discharge. Cardiovascular: No JVD.  Tachycardia. Respiratory: Normal effort.  No stridor.  Bilateral clear to auscultation. GI: Non-distended.  BS +. Skin: Warm and dry.  Intact. Psych: Normal mood.  Normal behavior. Musc: Edema in bilateral feet.  Tenderness in bilateral feet. Neuro: Alert Motor: 4/5 in right and 5/5 left deltoid, bicep, tricep, grip, 4/5 B hip flexor, knee extensors, trace ankle dorsiflexor and plantar flexor (due to pain with active foot/ankle movement), unchanged  Assessment/Plan: 1. Functional deficits which require 3+ hours per day of interdisciplinary therapy in a comprehensive inpatient rehab setting. Physiatrist is providing close team supervision and 24 hour  management of active medical problems listed below. Physiatrist and rehab team continue to assess barriers to discharge/monitor patient progress toward functional and medical goals  Care Tool:  Bathing    Body parts bathed by patient: Right arm, Left arm, Chest, Abdomen, Right upper leg, Left upper leg, Face, Front perineal area, Right lower leg, Left lower leg, Buttocks   Body parts bathed by helper: Buttocks, Right lower leg, Left lower leg     Bathing assist Assist Level: Independent with assistive device (bedlevel, clinical judgement)     Upper Body Dressing/Undressing Upper body dressing   What is the patient wearing?: Pull over shirt    Upper body assist Assist Level: Independent with assistive device (bedlevel, clinical judgement)    Lower Body Dressing/Undressing Lower body dressing      What is the patient wearing?: Underwear/pull up, Pants     Lower body assist Assist for lower body dressing: Independent with assitive device (bedlevel, clinical judgement)     Toileting Toileting    Toileting assist Assist for toileting: Independent with assistive device (per PT)     Transfers Chair/bed transfer  Transfers assist     Chair/bed transfer assist level: Contact Guard/Touching assist Chair/bed transfer assistive device: Programmer, multimedia  Ambulation assist   Ambulation activity did not occur: Refused  Assist level: Supervision/Verbal cueing Assistive device: Walker-rolling Max distance: 300 ft   Walk 10 feet activity   Assist  Walk 10 feet activity did not occur: Refused  Assist level: Independent with assistive device Assistive device: Walker-rolling   Walk 50 feet activity   Assist Walk 50 feet with 2 turns activity did not occur: Refused  Assist level: Independent with assistive device Assistive device: Walker-rolling    Walk 150 feet activity   Assist Walk 150 feet activity did not occur: Refused  Assist level:  Supervision/Verbal cueing Assistive device: Walker-rolling    Walk 10 feet on uneven surface  activity   Assist Walk 10 feet on uneven surfaces activity did not occur: Safety/medical concerns   Assist level: Supervision/Verbal cueing Assistive device: Aeronautical engineer Will patient use wheelchair at discharge?: Yes Type of Wheelchair: Manual Wheelchair activity did not occur: Refused (Pt stated he would not leave his room)  Wheelchair assist level: Independent Max wheelchair distance: 150 ft    Wheelchair 50 feet with 2 turns activity    Assist    Wheelchair 50 feet with 2 turns activity did not occur: Refused   Assist Level: Independent   Wheelchair 150 feet activity     Assist  Wheelchair 150 feet activity did not occur: Refused   Assist Level: Independent   Blood pressure 137/84, pulse (!) 116, temperature 98.5 F (36.9 C), temperature source Oral, resp. rate 17, height '5\' 10"'$  (1.778 m), weight 98.2 kg, SpO2 100 %.  Medical Problem List and Plan: 1.  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  Impaired mobility, was ambulating >200 feet: d/c Lovenox  Patient doesn't want lovenox.  3. Left shoulder/back/foot pain/Pain/diabetic peripheral neuropathy: continue Oxycodone prn. Increase steroids for gout flare. Will do 60 mg daily x 2 days, then taper. Pt rates pain as "11/10". Needs to bring in shoes so able to walk better; If steroids PO doesn't work, might need L shoulder injection. Pt hates needles.   8/1- added Lidoderm patches for Shoulder and back pain. 8pm to 8am  8/6 increase gabapentin to '300mg'$  TID, increase to 600 3 times daily on 8/13 Increase Oxy to 15 mg every 4 as needed  8/16 tenosynovitis and probable gouty arthritis, check urate, agree with ID will need steroid resumed, will restart taper at '40mg'$  and taper more slowly, ?rheum eval as OP  Some improvement  on 8/17 4. Mood: LCSW to follow for evaluation and support.              -antipsychotic agents: N/A 5. Neuropsych: This patient is capable of making decisions on his own behalf. 6. Skin/Wound Care: Routine pressure relief measures.  7. Fluids/Electrolytes/Nutrition: Strict I/Os 8. PJP PNA/leukocytosis:  Duration of meds discussed with pharmacy who relayed that patient has completed 21+ days of steroids and primaquine. --Clindamycin thorough 07/29 to complete 21 days. ID consulted, appreciate recs-recommended VIR evaluation.   VIR are recommending foot ultrasound, showing avascular masses versus complex fluid collections  9. Gout flare: Was on colchicine 0.6 mg daily PTA. --prednisone taper completed 8/9- colchicine dose x2 again to see if will help feet pain.  10. T2DM: Hgb A1c- 5.4. Was on glucotrol/metformin bid PTA.             --continue Lantus while on steroids with SSI for elevated BS.  Relatively controlled on  8/17 11. Anxiety d/o: Was on Klonopin TID PTA. --bid effective at this time. 12. HTN: Monitor BP tid--continue hydralazine and Norvasc.  Relatively controlled on 8/17 13. HIV: On Epivir and Tivicay 14. Anemia: H/H steadily improving.   Hemoglobin 8.3 on 8/16 15. Hypomagnesemia: MgOx 400 mg BID  Magnesium 1.7 on 8/9 16. Leukocytosis Appreciate ID recs-ordered Rh factor and quantiferon TB testing- also GC swabs  WBCs 13.1 on 8/13, confounded by steroids at present  Blood cultures NGTD  Fevers resolved  Started on Rocephin by ID  Await further ID recs   17. Dysphagia: Resolved 18. Urinary retention-resolved 19. Bowel incontinence- Improved 20. Chipped nails-?  Improving.  21. GERD  87/28-restarted Protonix- also has tums prn. 22. Pressure injury to buttocks: offload q41mnutes 23. Bilateral lower extremity swelling: ice, elevated, compression garments 24. Shoulder pain: continue lidocaine patch and voltaren gel.  25. Nearsighted- schedule outpatient ophthalmology  eval- should be checked for diabetic retinopathy 26. Diabetic peripheral neuropathy: will benefit from outpatient Qutenza. Ordered diabetic shoes  -gabapentin increased as above  Mildly elevated on 8/13 27. Lower extremity edema:resumed '10mg'$  daily Lasix.  28. AKI:   Creatinine 1.65 on 8/15  IVF initiated on 8/17 29.  Mild hyponatremia  Sodium 134 on 8/14, continue to monitor  LOS: 22 days A FACE TO FACE EVALUATION WAS PERFORMED  Heatherly Stenner ALorie Phenix8/17/2022, 10:19 AM

## 2021-04-21 NOTE — Progress Notes (Addendum)
Patient ID: Bruce Hunter, male   DOB: 1976-06-29, 45 y.o.   MRN: [id number] Late note-as of yesterday pt does not feel prepared  to go home. He is aware of team conference and the tentative discharge date set for 8/18. He feels he can to manage at home alone, wants to go to a NH from here. Bed search underway.  11:30 AM Spoke with pt and mom via telephone to inform them if he goes to a SNF they will take his social security check to assist with paying for facility. Pt does not wish to do this. Mom feels she can not take care of him. Discussed he has the ability to do it just all depends upon his feet and how painful they are this may change from day to day and he may need to use a wheelchair on bad days. Mom will talk with pt regarding this. Reached out to [facility] to make a referral for [facility] will take a week to contact pt to set up services.

## 2021-04-21 NOTE — Progress Notes (Signed)
Patient ID: Bruce Hunter, male   DOB: [birth year], 45 y.o.   MRN: [id number] Pt has decided to go home due to does want to give up SS check to NH facility. Mom in room and reportss she can not take care of him. Pt had thought he could stay with her for a few days and she reported no. Discussed when feet hurting to stay in wheelchair. Unsure if pt medically stable for discharge tomorrow, date set in team conference. PCS referral made and should take one week to get started according to [facility] Coordinator. Await medical stability for discharge.

## 2021-04-21 NOTE — Progress Notes (Signed)
Occupational Therapy Discharge Summary  Patient Details  Name: Bruce Hunter MRN: [id number] Date of Birth: 1975-10-10   Patient has met 1 of 2 long term goals due to improved activity tolerance, improved balance, postural control, ability to compensate for deficits, and improved coordination. Pt has had prolongued hospitalization complicated by pain and swelling in BLEs with inconsistent skill performance, now Mod I at wheelchair level per patient and staff report. Pt has been educated in several sessions regarding safety tips/techniques for home and during ADLs/iADLs but pt not receptive to education and continues to perform tasks in his own manner. Pt to DC home Independently with friends/family PRN. Note that goals had been checked of/met prior to extended stay/set back and pt is not performing most ADLs and ADL transfers from a standing level at this time and is Mod I from wheelchair level.   Reasons goals not met: pt continues to be limited by pain in regards to his standing abilities, exhibiting poor safety awareness with his ability to compensate (when standing). Therefore his dynamic standing goal was unable to be met. Pt able to perform ADLs at a seated level with Mod I per pt + staff report, as well as lateral scoot transfers  Recommendation:  Patient will benefit from ongoing skilled OT services in home health setting to continue to advance functional skills in the area of BADL, iADL, and Reduce care partner burden.  Equipment: BSC, TTB  Reasons for discharge: treatment goals met and discharge from hospital  Patient/family agrees with progress made and goals achieved: Yes  OT Discharge Precautions/Restrictions  Precautions Precautions: Fall Precaution Comments: Hypersentive and painful feet Restrictions Weight Bearing Restrictions: No Vital Signs Therapy Vitals Temp: 98.9 F (37.2 C) Temp Source: Oral Pulse Rate: (!) 109 Resp: 17 BP: 117/71 Patient Position (if  appropriate): Sitting Oxygen Therapy SpO2: 97 % O2 Device: Room Air Pain Pain Assessment Pain Scale: 0-10 Pain Score: 3  ADL ADL Toilet Transfer: Modified independent (per staff) Toilet Transfer Method: lateral scoot ADL Comments: Pt did not engage in a full ADL session during his CIR time post re-eval, reports he can complete ADLs at seated/bedlevel on his own and this has been confirmed by staff Vision Baseline Vision/History: Wears glasses Patient Visual Report: No change from baseline Perception  Perception: Within Functional Limits Praxis Praxis: Intact Praxis Impairment Details: Motor planning Cognition Overall Cognitive Status: Difficult to assess Arousal/Alertness: Awake/alert Orientation Level: Oriented X4 Attention: Alternating Alternating Attention: Impaired Alternating Attention Impairment: Verbal complex;Functional complex Memory: Impaired Memory Impairment: Retrieval deficit;Decreased recall of new information Decreased Short Term Memory: Verbal complex;Functional complex Awareness: Appears intact Problem Solving: Impaired Problem Solving Impairment: Verbal complex Executive Function: Self Correcting;Self Monitoring Self Monitoring: Impaired Self Correcting: Impaired Self Correcting Impairment: Verbal complex;Functional complex Behaviors: Restless;Impulsive;Poor frustration tolerance Safety/Judgment: Impaired Comments: Pt impulsive and with poor safety awareness, but likely close to baseline Sensation Sensation Light Touch: Impaired by gross assessment Hot/Cold: Appears Intact Proprioception: Appears Intact Stereognosis: Appears Intact Coordination Gross Motor Movements are Fluid and Coordinated: No Fine Motor Movements are Fluid and Coordinated: Yes Coordination and Movement Description: Affected by pain + swelling Motor  Motor Motor: Abnormal postural alignment and control Motor - Skilled Clinical Observations: Guarded motor movements due to pain,  extremely impulsive Motor - Discharge Observations: Uncoordinated and impulsive but improved from baseline Mobility  Bed Mobility Bed Mobility: Supine to Sit;Sit to Supine;Rolling Right;Rolling Left Rolling Right: Independent Rolling Left: Independent Supine to Sit: Independent Sit to Supine: Independent  Trunk/Postural Assessment  Cervical Assessment Cervical Assessment: Within Functional Limits Thoracic Assessment Thoracic Assessment: Within Functional Limits Lumbar Assessment Lumbar Assessment: Within Functional Limits Postural Control Postural Control: Within Functional Limits  Balance Balance Balance Assessed: Yes Dynamic Sitting Balance Dynamic Sitting - Balance Support: Feet supported Dynamic Sitting - Level of Assistance: 7: Independent Dynamic Sitting - Balance Activities: Lateral lean/weight shifting;Forward lean/weight shifting Static Standing Balance Static Standing - Balance Support:  (unable d/t pain) Dynamic Standing Balance Dynamic Standing - Balance Support:  (unable d/t pain) Extremity/Trunk Assessment RUE Assessment RUE Assessment: Exceptions to [facility] General Strength Comments: limited d/t stiffness in R elbow but functional for ADL tasks LUE Assessment LUE Assessment: Within Functional Limits   [name] 04/21/2021, 4:25 PM

## 2021-04-21 NOTE — Progress Notes (Signed)
Patient remains A&O x 4 and able to make his needs known. Continues to C/O chronic pain to back, legs, ankles, feet, and lower back. Voltaren gel given as ordered, patient also requested his PRN OXY 15 mg and his Tylenol 650 mg. Patient states he does get some relief from his PRN pain medications. BS remain within range. New order for 0.9 NS to run at 75 ml an hour/continuous. Patient states he feels much better than yesterday. Remains on Q 4 hours VS check. Call light within reach, personal items within reach. All safety devices on as ordered.

## 2021-04-21 NOTE — Progress Notes (Signed)
Occupational Therapy Session Note  Patient Details  Name: Bruce Hunter MRN: [id number] Date of Birth: Jul 24, 1976  Today's Date: 04/21/2021 OT Individual Time: 5331-7409 OT Individual Time Calculation (min): 40 min    Short Term Goals: Week 1:  OT Short Term Goal 1 (Week 1): ELOS=STGs due to ELOS OT Short Term Goal 1 - Progress (Week 1): Met OT Short Term Goal 2 (Week 1): Pt will perform LB dress with Mod A at sit > stand OT Short Term Goal 2 - Progress (Week 1): Met OT Short Term Goal 3 (Week 1): Pt will perform ADL routine with no more than Min cues for sequencing/problem solving OT Short Term Goal 3 - Progress (Week 1): Met OT Short Term Goal 4 (Week 1): Pt will complete ADL routine/activity of choice for 10 minutes without fatigue OT Short Term Goal 4 - Progress (Week 1): Met Week 2:  OT Short Term Goal 1 (Week 2): STGs = LTGs at Mod I   Skilled Therapeutic Interventions/Progress Updates:    Pt greeted at time of session sitting EOB eating snack requesting more peanut butter, checked with RN to approve and provided for the pt. Pt with pain in B feet throughout session but this has been ongoing throughout his stay and rest breaks provided PRN. Pt requesting to wash clothes, therapist checking washing machine and already busy. Altered session to focus on ADL transfers as pt is going home without assist and only occasional Supervision. Squat pivot bed > wheelchair <> BSC over toilet with Mod I, pt declining allowing therapist to provide close supervision/CGA. Remainder of session focused on discussion regarding making healthy decisions at home for DM friendly meals, safe choices at home/life choices for overall health, and pt not receptive to education. Pt up in chair with call bell in reach all needs met. Hand off to nursing working with IV.   Therapy Documentation Precautions:  Precautions Precautions: Fall Precaution Comments: Hypersentive and painful feet Restrictions Weight  Bearing Restrictions: No     Therapy/Group: Individual Therapy  [name] 04/21/2021, 4:11 PM

## 2021-04-21 NOTE — Progress Notes (Signed)
Physical Therapy Session Note  Patient Details  Name: Bruce Hunter MRN: [id number] Date of Birth: 03/06/1976  Today's Date: 04/21/2021 PT Individual Time: 1103-1200 PT Individual Time Calculation (min): 57 min   Short Term Goals: Week 1:  PT Short Term Goal 1 (Week 1): =LTGs d/t ELOS PT Short Term Goal 1 - Progress (Week 1): Met PT Short Term Goal 2 (Week 1): Pt will tolerate x 30 minutes of activity PT Short Term Goal 2 - Progress (Week 1): Met PT Short Term Goal 3 (Week 1): Pt will tolerate sitting OOB for 30 + minutes PT Short Term Goal 3 - Progress (Week 1): Met  Skilled Therapeutic Interventions/Progress Updates:    Pt in bed on arrival and states he needs to urinate. Independent bed mobility, pt able to use urinal mod I at EOB. Pt then dons ted hose with extended time, shoes with min A d/t edema and tight shoes. Pt reported pain in his feet and R arm throughout session, addressed with distraction and positioning, therapy to tolerance.   Scoot transfer bed>w/c with supervision d/t arm pain. Pt not receptive to cues/instructions for improved transfer mechanics. Pt propelled w/c with BUE x 150 ft to therapy gym mod I including managing leg rests and brakes. Car transfer with supervision d/t arm pain, previously mod I. Pt propelled w/c back to room and remained in chair at end of session stating he had to use urinal. Pt provided with urinal at end of session and was left with all needs in reach and alarm active.   Therapy Documentation Precautions:  Precautions Precautions: Fall Precaution Comments: Hypersentive and painful feet Restrictions Weight Bearing Restrictions: No General:   Vital Signs: Therapy Vitals Temp: 98.5 F (36.9 C) Temp Source: Oral Pulse Rate: (!) 116 Resp: 17 BP: 137/84 Patient Position (if appropriate): Sitting Oxygen Therapy SpO2: 100 % O2 Device: Room Air Pain: Pain Assessment Pain Scale: 0-10 Pain Score: 3  Pain Type: Chronic pain Pain  Location: Foot Pain Descriptors / Indicators: Aching;Restless;Constant Patients Stated Pain Goal: 2 Pain Intervention(s): Medication (See eMAR);Rest;Pain med given for lower pain score than stated, per patient request;Repositioned Multiple Pain Sites: Yes 2nd Pain Site Pain Score: 3 Pain Type: Chronic pain Pain Location: Back Pain Orientation: Lower Pain Descriptors / Indicators: Aching Pain Frequency: Intermittent Pain Onset: Gradual Patient's Stated Pain Goal: 2 Pain Intervention(s): Medication (See eMAR);Guided imagery;Rest;Back rub 3rd Pain Site Pain Score: 3 Pain Location: Elbow Pain Descriptors / Indicators: Heaviness;Cramping;Discomfort Pain Onset: Gradual Pain Frequency: Intermittent Pain Intervention(s): Medication (See eMAR);MD notified (Comment);Back rub PAINAD (Pain Assessment in Advanced Dementia) Breathing: normal Negative Vocalization: none Facial Expression: smiling or inexpressive Body Language: relaxed Consolability: no need to console PAINAD Score: 0 Critical Care Pain Observation Tool (CPOT) Facial Expression: Relaxed, neutral Body Movements: Absence of movements Muscle Tension: Relaxed Compliance with ventilator (intubated pts.): N/A Vocalization (extubated pts.): N/A CPOT Total: 0 Mobility: Bed Mobility Bed Mobility: Supine to Sit;Sit to Supine;Rolling Right;Rolling Left Rolling Right: Independent Rolling Left: Independent Supine to Sit: Independent Sit to Supine: Independent Transfers Transfers: Lateral/Scoot Transfers Lateral/Scoot Transfers: Independent with assistive device Transfer (Assistive device): Other (Comment) (incr time) Locomotion : Gait Ambulation: No Gait Distance (Feet): 0 Feet Gait Gait: No Stairs / Additional Locomotion Stairs: No Wheelchair Mobility Wheelchair Mobility: Yes Wheelchair Assistance: Independent with Camera operator: Both upper extremities Wheelchair Parts Management:  Independent Distance: 150 ft  Trunk/Postural Assessment : Cervical Assessment Cervical Assessment: Within Functional Limits Thoracic Assessment Thoracic Assessment: Within Functional Limits  Lumbar Assessment Lumbar Assessment: Within Functional Limits Postural Control Postural Control: Within Functional Limits  Balance: Balance Balance Assessed: Yes Dynamic Sitting Balance Dynamic Sitting - Balance Support: Feet supported Dynamic Sitting - Level of Assistance: 7: Independent Static Standing Balance Static Standing - Balance Support:  (unable d/t pain) Dynamic Standing Balance Dynamic Standing - Balance Support:  (unable d/t pain) Exercises:   Other Treatments:      Therapy/Group: Individual Therapy  Mickel Fuchs 04/21/2021, 1:00 PM

## 2021-04-22 ENCOUNTER — Inpatient Hospital Stay (HOSPITAL_COMMUNITY): Payer: Medicaid Other

## 2021-04-22 LAB — CBC WITH DIFFERENTIAL/PLATELET
Abs Immature Granulocytes: 0.28 10*3/uL — ABNORMAL HIGH (ref 0.00–0.07)
Basophils Absolute: 0 10*3/uL (ref 0.0–0.1)
Basophils Relative: 0 %
Eosinophils Absolute: 0 10*3/uL (ref 0.0–0.5)
Eosinophils Relative: 0 %
HCT: 27.1 % — ABNORMAL LOW (ref 39.0–52.0)
Hemoglobin: 8.4 g/dL — ABNORMAL LOW (ref 13.0–17.0)
Immature Granulocytes: 1 %
Lymphocytes Relative: 3 %
Lymphs Abs: 0.6 10*3/uL — ABNORMAL LOW (ref 0.7–4.0)
MCH: 29.6 pg (ref 26.0–34.0)
MCHC: 31 g/dL (ref 30.0–36.0)
MCV: 95.4 fL (ref 80.0–100.0)
Monocytes Absolute: 0.7 10*3/uL (ref 0.1–1.0)
Monocytes Relative: 3 %
Neutro Abs: 19.8 10*3/uL — ABNORMAL HIGH (ref 1.7–7.7)
Neutrophils Relative %: 93 %
Platelets: 431 10*3/uL — ABNORMAL HIGH (ref 150–400)
RBC: 2.84 MIL/uL — ABNORMAL LOW (ref 4.22–5.81)
RDW: 18.6 % — ABNORMAL HIGH (ref 11.5–15.5)
WBC: 21.5 10*3/uL — ABNORMAL HIGH (ref 4.0–10.5)
nRBC: 0 % (ref 0.0–0.2)

## 2021-04-22 LAB — T-HELPER CELLS (CD4) COUNT (NOT AT ARMC)
CD4 % Helper T Cell: 20 % — ABNORMAL LOW (ref 33–65)
CD4 T Cell Abs: 66 /uL — ABNORMAL LOW (ref 400–1790)

## 2021-04-22 LAB — GLUCOSE, CAPILLARY
Glucose-Capillary: 99 mg/dL (ref 70–99)
Glucose-Capillary: 135 mg/dL — ABNORMAL HIGH (ref 70–99)
Glucose-Capillary: 204 mg/dL — ABNORMAL HIGH (ref 70–99)
Glucose-Capillary: 120 mg/dL — ABNORMAL HIGH (ref 70–99)

## 2021-04-22 LAB — HIV-1 RNA QUANT-NO REFLEX-BLD
HIV 1 RNA Quant: 100 copies/mL
LOG10 HIV-1 RNA: 2 log10copy/mL

## 2021-04-22 IMAGING — MR MR ELBOW*R* WO/W CM
5 of 10 series · 19 of 40 positions shown · IV contrast (gadavist)
Comparison: None.

CLINICAL DATA: Joint effusion, elbow

EXAM:
MRI OF THE RIGHT ELBOW WITHOUT AND WITH CONTRAST
TECHNIQUE: Multiplanar, multisequence MR imaging of the elbow was performed
before and after the administration of intravenous contrast.
CONTRAST:  9mL GADAVIST GADOBUTROL 1 MMOL/ML IV SOLN

[Series 3: T1 · axial · 3.0mm · 0.31mm/px · z∈[-77,+58]mm · 3 of 36 slices shown]
[im 1/36]
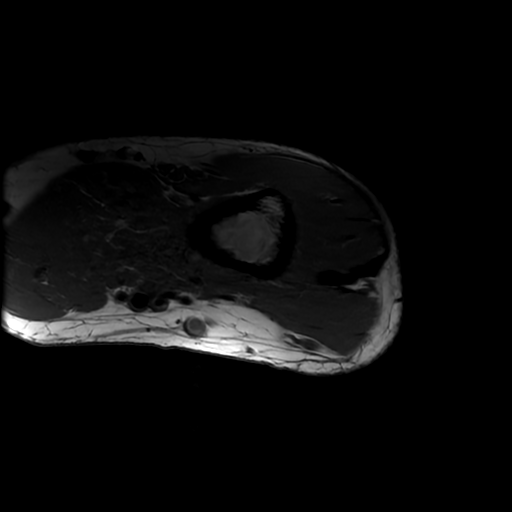
[im 18/36]
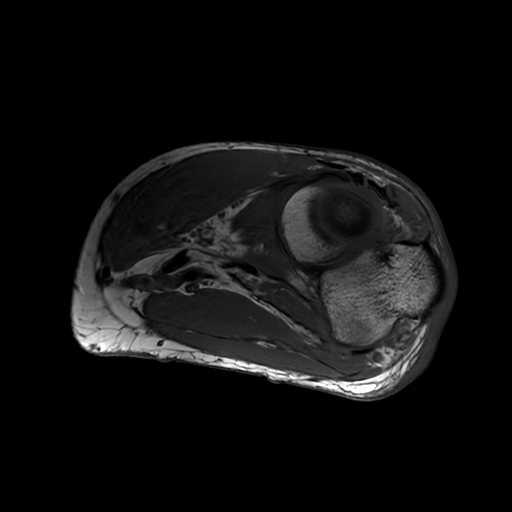
[im 36/36]
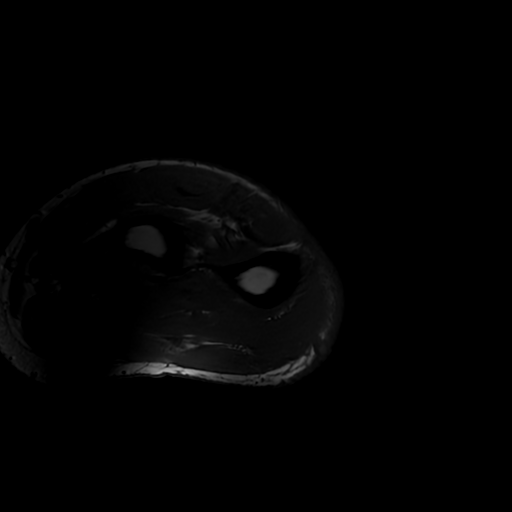

[Series 4: T2 fat-sat · sagittal · 3.0mm · 0.31mm/px · 4 of 46 slices shown (1 of 3)]
[im 1/46]
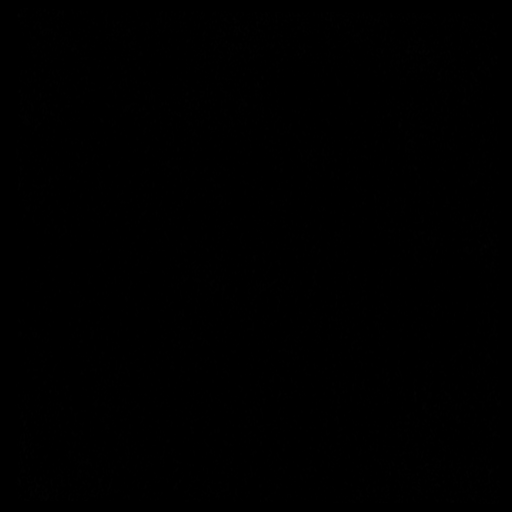
[im 16/46]
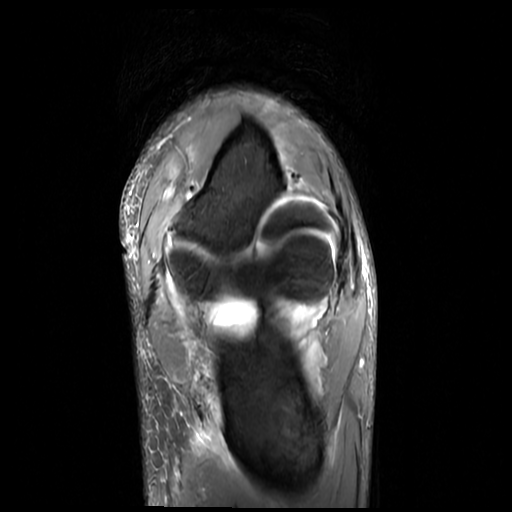
[im 31/46]
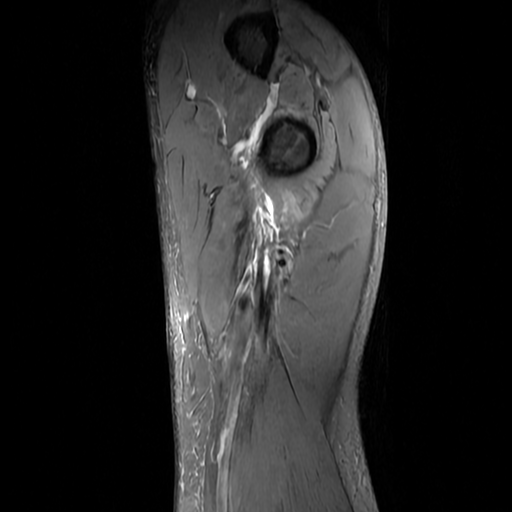
[im 46/46]
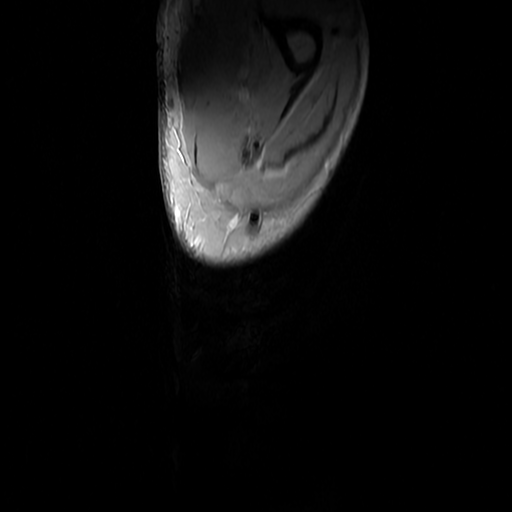

[Series 5: T2 fat-sat · axial · 3.0mm · 0.31mm/px · z∈[-77,+58]mm · 4 of 36 slices shown (2 of 3)]
[im 1/36]
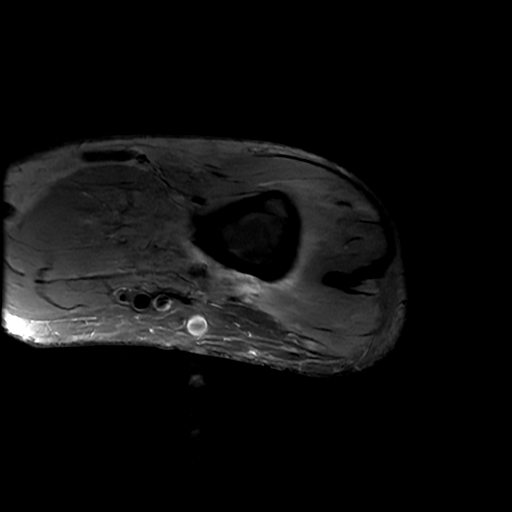
[im 12/36]
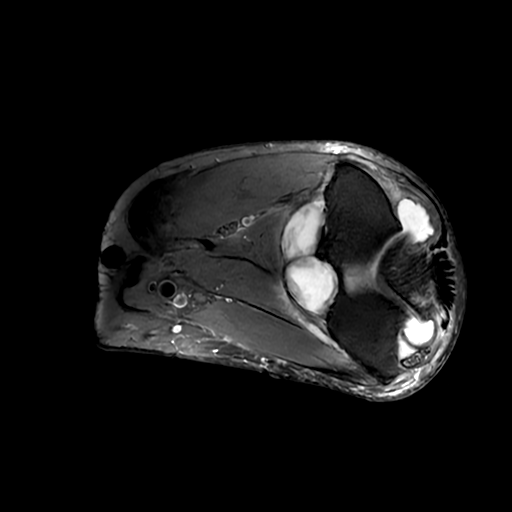
[im 24/36]
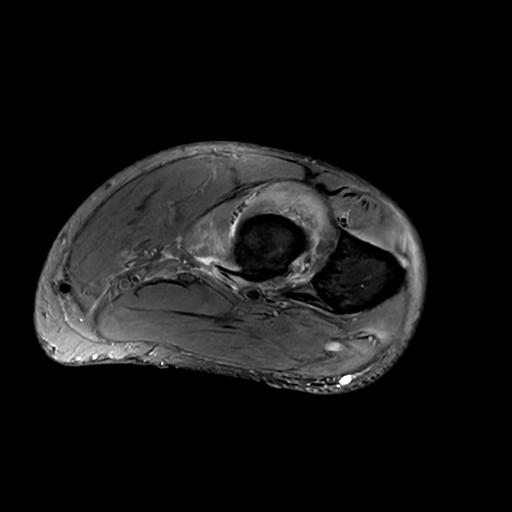
[im 36/36]
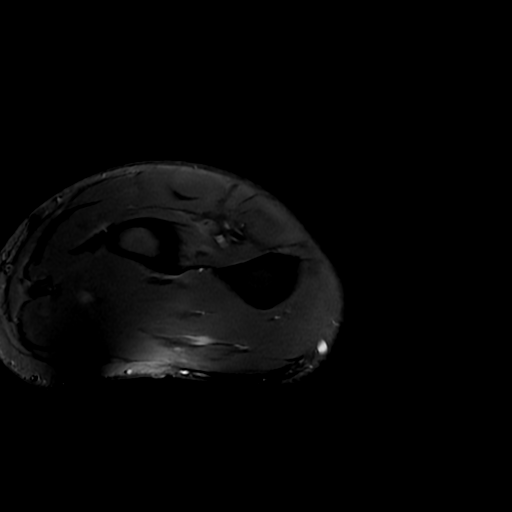

[Series 6: T2 fat-sat · coronal · 3.0mm · 0.31mm/px · 3 of 29 slices shown (3 of 3)]
[im 1/29]
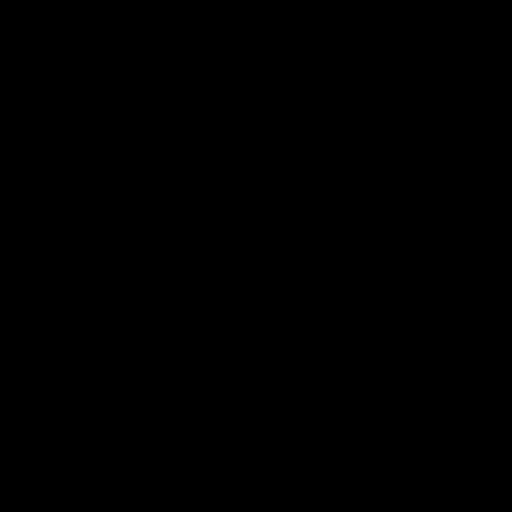
[im 15/29]
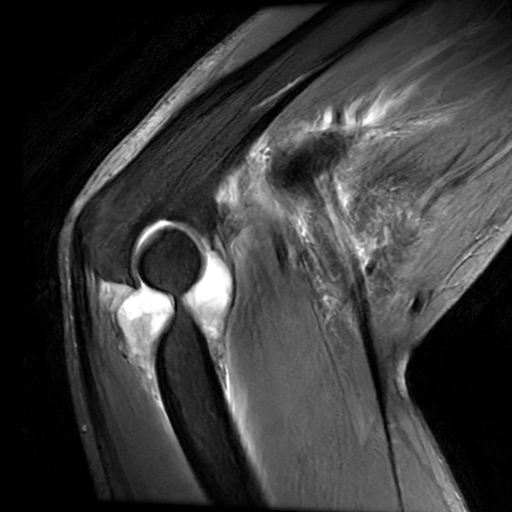
[im 29/29]
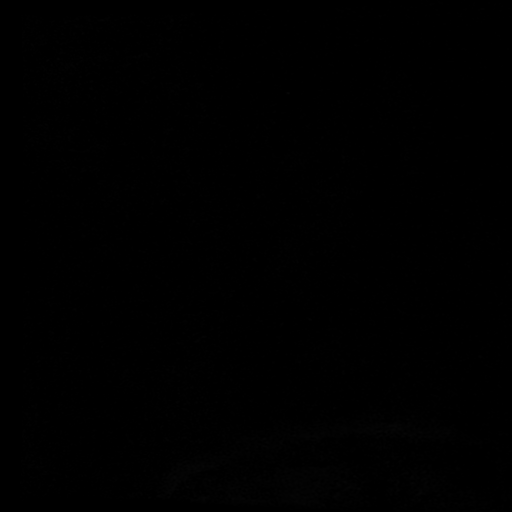

[Series 8: PD fat-sat · sagittal · 3.0mm · 0.31mm/px · 5 of 46 slices shown]
[im 1/46]
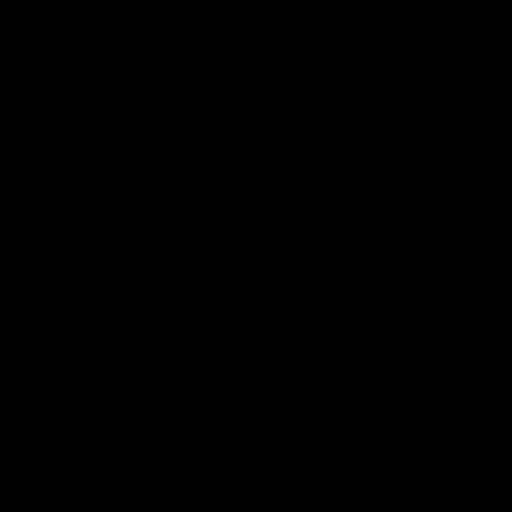
[im 12/46]
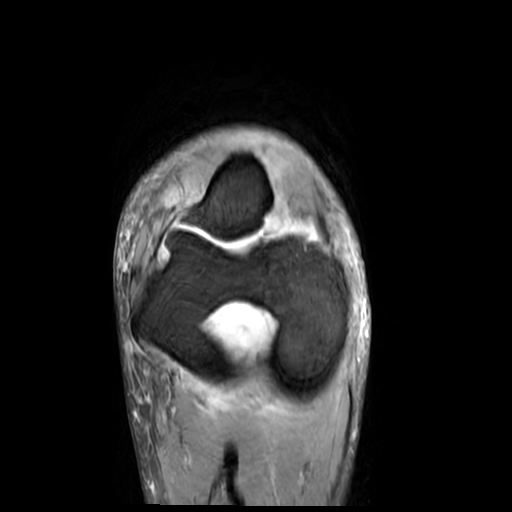
[im 23/46]
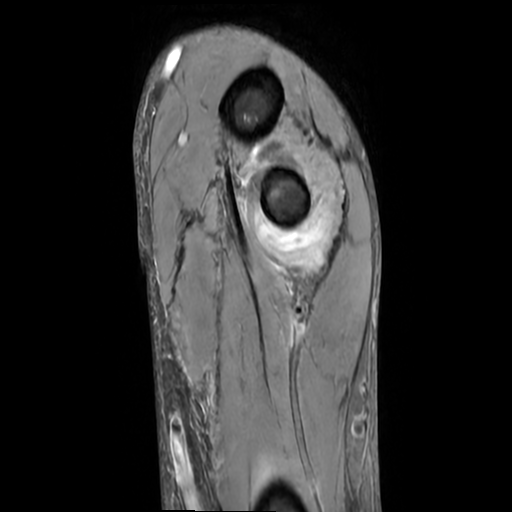
[im 34/46]
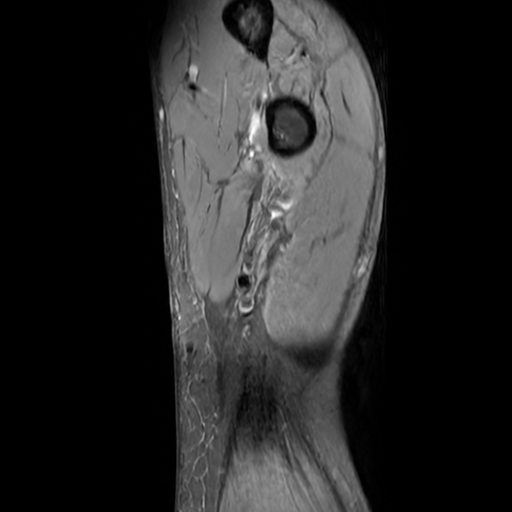
[im 46/46]
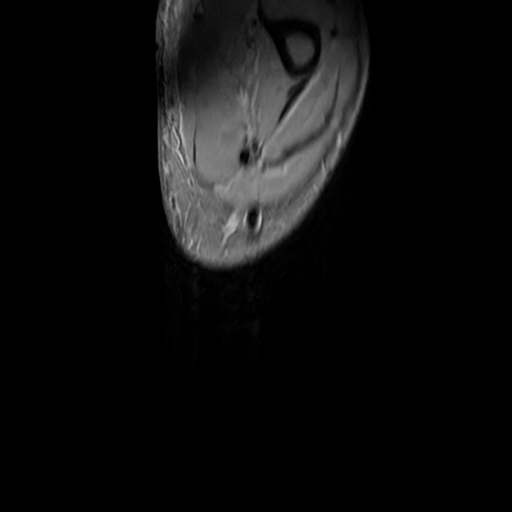

[19 of 40 positions shown; findings below may reference images not displayed]

FINDINGS: TENDONS

Common forearm flexor origin: Intact

Common forearm extensor origin: Intact

Biceps: Intact

Triceps: Intact

LIGAMENTS

Medial stabilizers: Intact

Lateral stabilizers:  Intact

Cartilage: No chondral defect.

Joint: There is a moderate-sized joint effusion with synovitis and
synovial enhancement.

Cubital tunnel: Normal.

Bones: No fracture or dislocation. No significant marrow
abnormality.

Soft Tissues: Mild periarticular intramuscular edema signal, likely
reactive.
IMPRESSION: Moderate-sized elbow joint effusion with synovitis. Joint aspiration
should be considered for further evaluation, as
infectious/inflammatory/crystalline arthropathy are diagnostic
considerations. No evidence of osteomyelitis at this time.

## 2021-04-22 IMAGING — RF DG FLUORO GUIDE NDL PLC/BX
2 series · 2 of 2 positions shown · non-contrast
Comparison: Right elbow MRI [DATE]

CLINICAL DATA: Right elbow pain, aspiration

EXAM:
RIGHT ELBOW JOINT ASPIRATION UNDER FLUOROSCOPY

[Series 1: cp_standard · 0.17mm/px · 1 of 1 slices shown (1 of 2)]
[im 1/1]
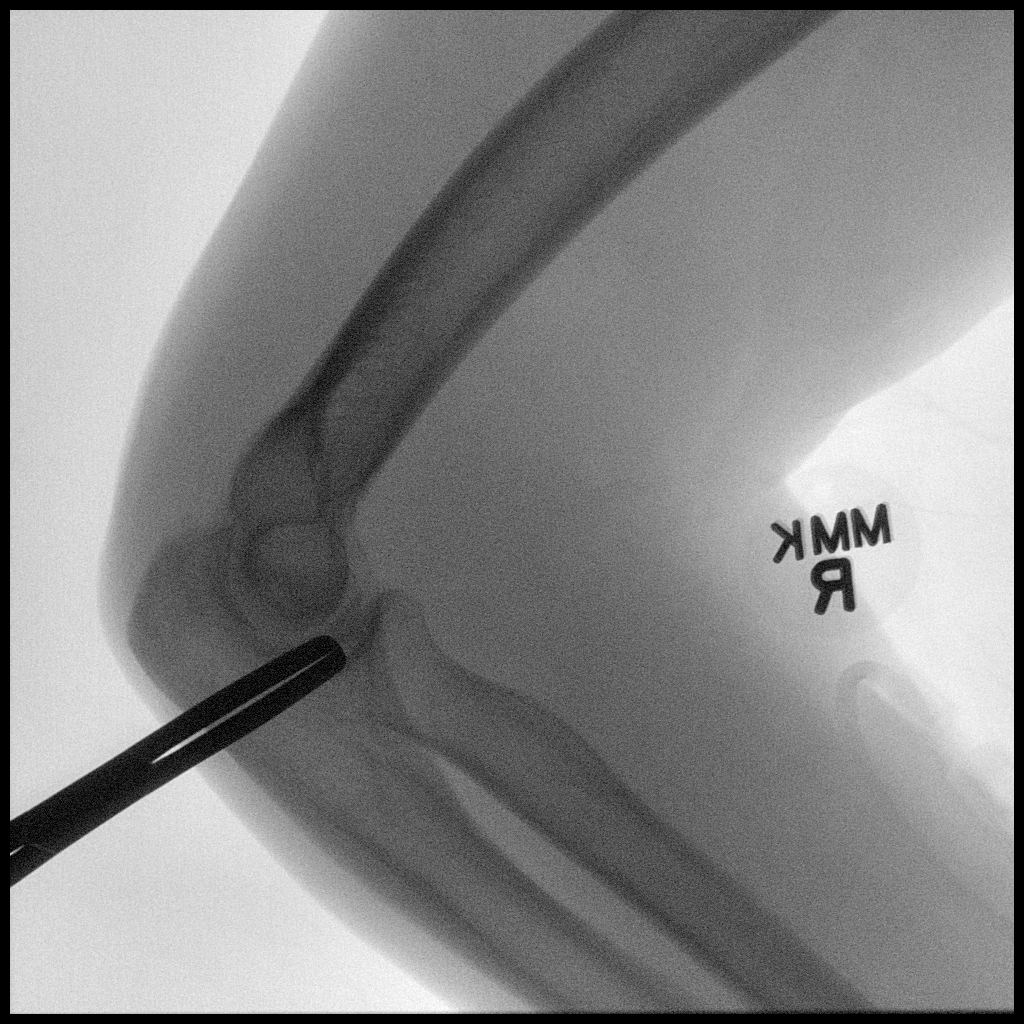

[Series 2: cp_standard · 0.17mm/px · 1 of 1 slices shown (2 of 2)]
[im 1/1]
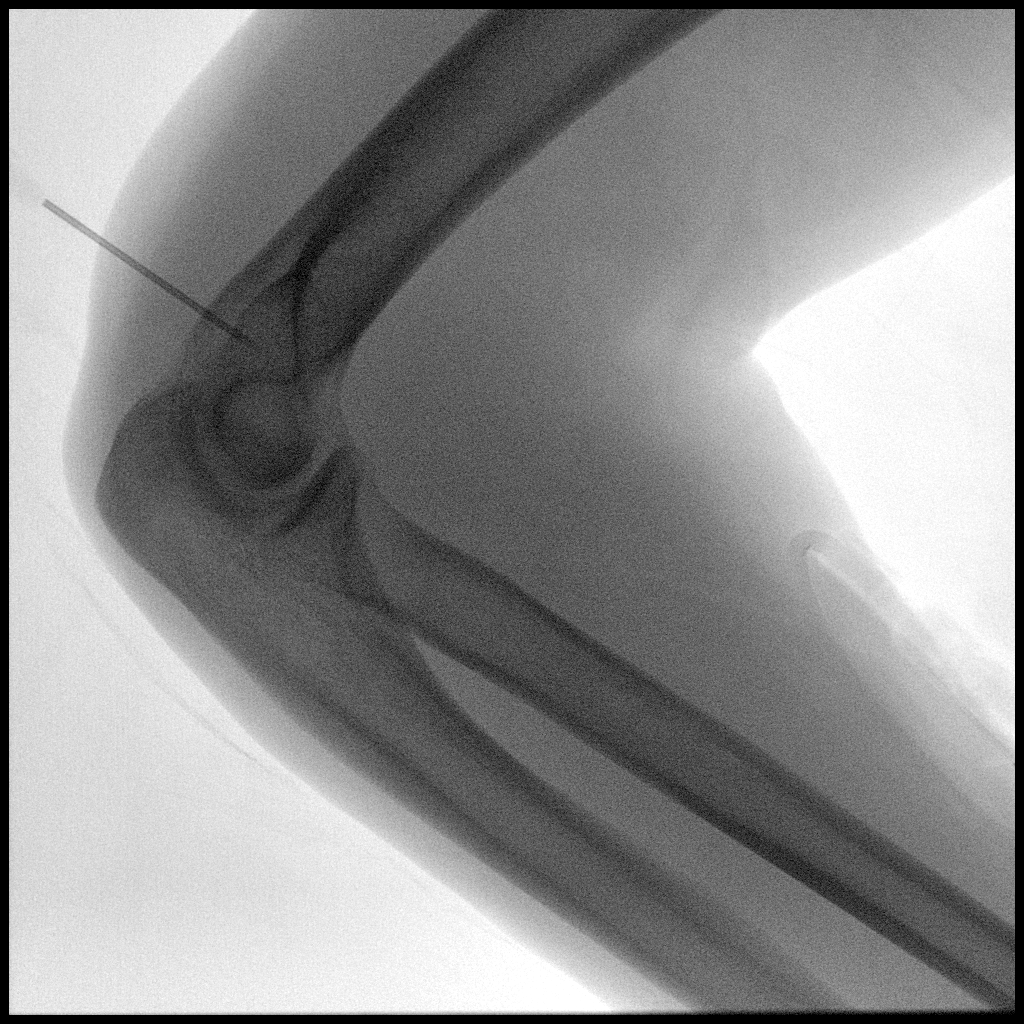

[2 of 2 positions shown; findings below may reference images not displayed]

FLUOROSCOPY TIME:  Fluoroscopy Time:  12 seconds

Radiation Exposure Index (if provided by the fluoroscopic device):
0.2 mGy

Number of Acquired Spot Images: 0

PROCEDURE:
Overlying skin prepped with Betadine and draped in the usual sterile
fashion. Local anesthesia was administered utilizing 1% lidocaine.
An 18 gauge needle was advanced into the right elbow utilizing a
posterior approach through the triceps. Approximately 4 mL of cloudy
yellow fluid was aspirated from the elbow joint. The fluid was sent
to the laboratory for analysis.

The patient tolerated the procedure well. There was no immediate
complication.
IMPRESSION: Successful right elbow aspiration under fluoroscopy. Approximately 4
mL of cloudy yellow fluid was aspirated from the joint and sent to
the laboratory for analysis. No immediate complication.

## 2021-04-22 MED ORDER — ACETAMINOPHEN 325 MG PO TABS
325.0000 mg | ORAL_TABLET | ORAL | Status: DC | PRN
  Administered 2021-04-22 – 2021-05-03 (×50): 325 mg via ORAL
  Filled 2021-04-22 (×51): qty 1

## 2021-04-22 MED ORDER — IOHEXOL 180 MG/ML  SOLN: 10.0000 mL | Freq: Once | INTRAMUSCULAR | Status: DC | PRN

## 2021-04-22 MED ORDER — LIDOCAINE HCL (PF) 1 % IJ SOLN
2.0000 mL | Freq: Once | INTRAMUSCULAR | Status: DC
  Filled 2021-04-22 (×2): qty 30

## 2021-04-22 MED ORDER — LORAZEPAM 0.5 MG PO TABS
0.5000 mg | ORAL_TABLET | Freq: Once | ORAL | Status: AC
  Administered 2021-04-22: 0.5 mg via ORAL
  Filled 2021-04-22: qty 1

## 2021-04-22 MED ORDER — GADOBUTROL 1 MMOL/ML IV SOLN
9.0000 mL | Freq: Once | INTRAVENOUS | Status: AC | PRN
  Administered 2021-04-22: 9 mL via INTRAVENOUS

## 2021-04-22 MED ORDER — LIDOCAINE HCL (PF) 1 % IJ SOLN
INTRAMUSCULAR | Status: AC
  Filled 2021-04-22: qty 5

## 2021-04-22 NOTE — Progress Notes (Signed)
PROGRESS NOTE   Subjective/Complaints: Appreciate ID note, pt states R elbow doing better today , able to feed himself with RUE  No sweats or chills  ROS: Denies CP, SOB, N/V/D  Objective:   No results found. Recent Labs    04/19/21 1147 04/20/21 1011  WBC 21.0* 15.4*  HGB 8.3* 8.3*  HCT 27.2* 26.2*  PLT 277 306      Recent Labs    04/19/21 1147  NA 133*  K 4.6  CL 100  CO2 21*  GLUCOSE 112*  BUN 54*  CREATININE 1.65*  CALCIUM 9.8     Intake/Output Summary (Last 24 hours) at 04/22/2021 0753 Last data filed at 04/21/2021 2112 Gross per 24 hour  Intake 1019.25 ml  Output 1001 ml  Net 18.25 ml          Physical Exam: Vital Signs Blood pressure 123/78, pulse 84, temperature 98.5 F (36.9 C), temperature source Oral, resp. rate 17, height 5' 10"  (1.778 m), weight 91.9 kg, SpO2 100 %.  General: No acute distress Mood and affect are appropriate Heart: Regular rate and rhythm no rubs murmurs or extra sounds Lungs: Clear to auscultation, breathing unlabored, no rales or wheezes Abdomen: Positive bowel sounds, soft nontender to palpation, nondistended Extremities: No clubbing, cyanosis, +2 pedal edema unchanged  Skin: No evidence of breakdown, no evidence of rash MSK- RIgh telbow joint effusion unable to extend beyond - 30deg Neuro: Alert Motor: 4/5 in right and 5/5 left deltoid, bicep, tricep, grip, 4/5 B hip flexor, knee extensors, trace ankle dorsiflexor and plantar flexor (due to pain with active foot/ankle movement), unchanged  Assessment/Plan: 1. Functional deficits which require 3+ hours per day of interdisciplinary therapy in a comprehensive inpatient rehab setting. Physiatrist is providing close team supervision and 24 hour management of active medical problems listed below. Physiatrist and rehab team continue to assess barriers to discharge/monitor patient progress toward functional and medical  goals  Care Tool:  Bathing    Body parts bathed by patient: Right arm, Left arm, Chest, Abdomen, Right upper leg, Left upper leg, Face, Front perineal area, Right lower leg, Left lower leg, Buttocks   Body parts bathed by helper: Buttocks, Right lower leg, Left lower leg     Bathing assist Assist Level: Independent with assistive device (per staff)     Upper Body Dressing/Undressing Upper body dressing   What is the patient wearing?: Pull over shirt    Upper body assist Assist Level: Independent with assistive device (per staff)    Lower Body Dressing/Undressing Lower body dressing      What is the patient wearing?: Underwear/pull up, Pants     Lower body assist Assist for lower body dressing: Independent with assitive device (per staff)     Toileting Toileting    Toileting assist Assist for toileting: Independent with assistive device (per staff)     Transfers Chair/bed transfer  Transfers assist     Chair/bed transfer assist level: Independent with assistive device Chair/bed transfer assistive device: Armrests   Locomotion Ambulation   Ambulation assist   Ambulation activity did not occur: Refused  Assist level: Supervision/Verbal cueing Assistive device: Walker-rolling Max distance: 300 ft  Walk 10 feet activity   Assist  Walk 10 feet activity did not occur: Refused  Assist level: Independent with assistive device Assistive device: Walker-rolling   Walk 50 feet activity   Assist Walk 50 feet with 2 turns activity did not occur: Refused  Assist level: Independent with assistive device Assistive device: Walker-rolling    Walk 150 feet activity   Assist Walk 150 feet activity did not occur: Refused  Assist level: Supervision/Verbal cueing Assistive device: Walker-rolling    Walk 10 feet on uneven surface  activity   Assist Walk 10 feet on uneven surfaces activity did not occur: Refused   Assist level: Supervision/Verbal  cueing Assistive device: Aeronautical engineer Will patient use wheelchair at discharge?: Yes Type of Wheelchair: Manual Wheelchair activity did not occur: Refused (Pt stated he would not leave his room)  Wheelchair assist level: Independent Max wheelchair distance: 150 ft    Wheelchair 50 feet with 2 turns activity    Assist    Wheelchair 50 feet with 2 turns activity did not occur: Refused   Assist Level: Independent   Wheelchair 150 feet activity     Assist  Wheelchair 150 feet activity did not occur: Refused   Assist Level: Independent   Blood pressure 123/78, pulse 84, temperature 98.5 F (36.9 C), temperature source Oral, resp. rate 17, height 5' 10"  (1.778 m), weight 91.9 kg, SpO2 100 %.  Medical Problem List and Plan: 1.  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  Impaired mobility, was ambulating >200 feet: d/c Lovenox  Patient doesn't want lovenox.  3. Left shoulder/back/foot pain/Pain/diabetic peripheral neuropathy: continue Oxycodone prn. Increase steroids for gout flare. Will do 60 mg daily x 2 days, then taper. Pt rates pain as "11/10". Needs to bring in shoes so able to walk better; If steroids PO doesn't work, might need L shoulder injection. Pt hates needles.   8/1- added Lidoderm patches for Shoulder and back pain. 8pm to 8am  8/6 increase gabapentin to 372m TID, increase to 600 3 times daily on 8/13 Increase Oxy to 15 mg every 4 as needed  8/16 tenosynovitis and probable gouty arthritis, check urate, agree with ID will need steroid resumed, will restart taper at 432mand taper more slowly, ?rheum eval as OP  Some improvement on 8/18 4. Mood: LCSW to follow for evaluation and support.              -antipsychotic agents: N/A 5. Neuropsych: This patient is  capable of making decisions on his own behalf. 6. Skin/Wound Care: Routine pressure relief measures.  7. Fluids/Electrolytes/Nutrition: Strict I/Os 8. PJP PNA/leukocytosis:  Duration of meds discussed with pharmacy who relayed that patient has completed 21+ days of steroids and primaquine. --Clindamycin thorough 07/29 to complete 21 days. ID consulted, appreciate recs-recommended VIR evaluation.   VIR are recommending foot ultrasound, showing avascular masses versus complex fluid collections  9. Gout flare: Was on colchicine 0.6 mg daily PTA. --prednisone taper completed 8/9- colchicine dose x2 again to see if will help feet pain.  10. T2DM: Hgb A1c- 5.4. Was on glucotrol/metformin bid PTA.             --continue Lantus while on steroids with SSI for elevated BS.  CBG (last 3)  Recent Labs    04/21/21  1654 04/21/21 2113 04/22/21 0630  GLUCAP 153* 210* 99  Fair control some lability   11. Anxiety d/o: Was on Klonopin TID PTA. --bid effective at this time. 12. HTN: Monitor BP tid--continue hydralazine and Norvasc.   Vitals:   04/22/21 0254 04/22/21 0618  BP: 123/81 123/78  Pulse: 80 84  Resp: 18 17  Temp: 97.6 F (36.4 C) 98.5 F (36.9 C)  SpO2: 99% 100%  Controlled   13. HIV: On Epivir and Tivicay 14. Anemia: H/H steadily improving.   Hemoglobin 8.3 on 8/16 stable  15. Hypomagnesemia: MgOx 400 mg BID  Magnesium 1.7 on 8/9 16. Leukocytosis Appreciate ID recs-ordered Rh factor and quantiferon TB testing- also GC swabs  WBCs 13.1 on 8/13, confounded by steroids at present  Blood cultures NGTD  Fevers resolved  Started on Rocephin by ID  Await further ID recs   17. Dysphagia: Resolved 18. Urinary retention-resolved 19. Bowel incontinence- Improved 20. Chipped nails-?  Improving.  21. GERD  87/28-restarted Protonix- also has tums prn. 22. Pressure injury to buttocks: offload q39mnutes 23. Bilateral lower extremity swelling: ice, elevated, compression garments 24.  Shoulder pain: continue lidocaine patch and voltaren gel.  25. Nearsighted- schedule outpatient ophthalmology eval- should be checked for diabetic retinopathy 26. Diabetic peripheral neuropathy: will benefit from outpatient Qutenza. Ordered diabetic shoes  -gabapentin increased as above  Mildly elevated on 8/13 27. Lower extremity edema:resumed 173mdaily Lasix.  28. Acute exacerbation of CKD baseline BUN/ Creat about 30/1.3   Creatinine 1.65 on 8/15  IVF initiated on 8/17, repeat in am  29.  Mild hyponatremia  Sodium 134 on 8/14, continue to monitor  LOS: 23 days A FACE TO FAHavana Lashone Stauber 04/22/2021, 7:53 AM

## 2021-04-22 NOTE — Progress Notes (Signed)
MRI called that they will do the aspiration ordered to right elbow tom 8/19 at 10 am.

## 2021-04-22 NOTE — Progress Notes (Signed)
Occupational Therapy Session Note  Patient Details  Name: Bruce Hunter MRN: [id number] Date of Birth: 18-Feb-1976  Today's Date: 04/22/2021 OT Individual Time: 1440-1510 OT Individual Time Calculation (min): 30 min    Short Term Goals: Week 2:  OT Short Term Goal 1 (Week 2): STGs = LTGs at Mod I  Skilled Therapeutic Interventions/Progress Updates:    Pt greeted seated on BSC after BM. Pt was able to complete peri-care and clothing management, then transfer back to bed with supervision. Sitting EOB, pt completed UB there-ex using green theraband. Chest press, straight arm raise, side rise, seated row. Pt returned to supine and left semi-reclined in bed with bed alarm on, call bell in reach and needs met.   Therapy Documentation Precautions:  Precautions Precautions: Fall Precaution Comments: Hypersentive and painful feet Restrictions Weight Bearing Restrictions: No Pain:  No number given. Pt reports pain in B Les. Rest and repositioned for comfort  Therapy/Group: Individual Therapy  Valma Cava 04/22/2021, 3:07 PM

## 2021-04-22 NOTE — Progress Notes (Signed)
      INFECTIOUS DISEASE ATTENDING ADDENDUM:   Date: 04/22/2021  Patient name: Bruce Hunter  Medical record number: [id number]  Date of birth: 02-08-1976    Bruce Hunter has an appointment on 05/07/2021 with Dr Tommy Medal at [facility] at:  [facility] for Infectious Disease is located in the [facility] at  [place] in [place].  [address], which is located to the left of the elevators.  Phone: [phone]  Fax: [phone]  [email or web address]  He should arrive 15-30 minutes prior to his appointment.  Alcide Evener 04/22/2021, 10:32 AM

## 2021-04-22 NOTE — Progress Notes (Signed)
Occupational Therapy Session Note  Patient Details  Name: [name] MRN: [id number] Date of Birth: 07-Jan-1976  Today's Date: 04/23/2021 OT Individual Time: [id number] Individual Time Calculation (min): 33 min    Skilled Therapeutic Interventions/Progress Updates:    Pt greeted in bed, premedicated for pain. Mod I for supine<sit using hospital controls. Mod I for lateral scoot<w/c with increased time, OT managing his IV. To work on [employer] strengthening/endurance as well as incorporate psychosocial wellbeing into tx, pt self propelled the w/c to the gift shop. Worked on w/c navigation through tight spaces and around obstacles. Discussed importance of routine pressure relief exercises in the w/c and reviewed them together. Due to time constraints, pt was escorted back to his room. He was agreeable to remain sitting up. Left with all needs within reach.   Therapy Documentation Precautions:  Precautions Precautions: Fall Precaution Comments: Hypersentive and painful feet Restrictions Weight Bearing Restrictions: No  Vital Signs: Therapy Vitals Temp: 99.3 F (37.4 C) Temp Source: Oral Pulse Rate: (!) 106 Resp: 18 BP: (!) 148/96 Patient Position (if appropriate): Sitting Oxygen Therapy SpO2: 100 % O2 Device: Room Air Pain: in feet Pain Assessment Pain Scale: 0-10 Pain Score: 10-Worst pain ever Pain Type: Chronic pain Pain Location: Foot Pain Orientation: Left;Right Pain Descriptors / Indicators: Aching Pain Frequency: Constant Pain Onset: On-going Patients Stated Pain Goal: 5 Pain Intervention(s): Medication (See eMAR) (oxycodone and tylenol given) ADL: ADL Eating: Independent (per staff) Grooming: Modified independent (per staff) Where Assessed-Grooming: Sitting at sink Upper Body Bathing: Modified independent (per staff) Where Assessed-Upper Body Bathing: Sitting at sink Lower Body Bathing: Modified independent (per staff) Where Assessed-Lower Body Bathing: Sitting  at sink, Standing at sink Upper Body Dressing: Modified independent (Device) (per staff) Where Assessed-Upper Body Dressing: Standing at sink Lower Body Dressing: Modified independent (per staff) Where Assessed-Lower Body Dressing: Sitting at sink, Standing at sink Toileting: Modified independent (per staff) Toilet Transfer: Modified independent (per staff) Toilet Transfer Method: Squat pivot Tub/Shower Transfer: Close supervison Social research officer, government: Not assessed ADL Comments: Using clinical judgement, pt can perform BADLs from bedlevel at Mod I level      Therapy/Group: Individual Therapy  Bruce Hunter [name] 04/23/2021, 3:58 PM

## 2021-04-22 NOTE — Progress Notes (Addendum)
[name] for Infectious Disease    Date of Admission:  03/30/2021   Total days of antibiotics 4           ID: Bruce Hunter is a 45 y.o. 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 Less fever. Has still swelling in right elbow, awaiting mri. Still having difficulty with weight bearing, and bilateral feet swelling  Medications:   (feeding supplement) PROSource Plus  30 mL Oral TID BM   clonazePAM  0.5 mg Oral TID   dapsone  100 mg Oral Daily   diclofenac Sodium  2 g Topical QID   dolutegravir  50 mg Oral Daily   furosemide  40 mg Oral Q M,W,F   gabapentin  600 mg Oral Q12H   hydrALAZINE  25 mg Oral Q8H   insulin aspart  0-15 Units Subcutaneous TID WC   insulin aspart  0-5 Units Subcutaneous QHS   insulin glargine-yfgn  5 Units Subcutaneous Daily   lamiVUDine  300 mg Oral Daily   lidocaine  1 patch Transdermal Q24H   lidocaine  2 patch Transdermal Q24H   magnesium oxide  400 mg Oral BID   metoprolol tartrate  25 mg Oral BID   multivitamin with minerals  1 tablet Oral Daily   pantoprazole  40 mg Oral Daily   predniSONE  40 mg Oral Q breakfast   tamsulosin  0.4 mg Oral QPC supper    Objective: Vital signs in last 24 hours: Temp:  [97.6 F (36.4 C)-100.1 F (37.8 C)] 100.1 F (37.8 C) (08/18 1225) Pulse Rate:  [80-109] 96 (08/18 1225) Resp:  [17-18] 17 (08/18 0618) BP: (117-128)/(71-85) 127/85 (08/18 1225) SpO2:  [97 %-100 %] 99 % (08/18 1225) Weight:  [91.9 kg] 91.9 kg (08/18 0500) Physical Exam  Constitutional: He is oriented to person, place,  and time. He appears well-developed and well-nourished. No distress.  HENT:  Mouth/Throat: Oropharynx is clear and moist. No oropharyngeal exudate.  Cardiovascular: Normal rate, regular rhythm and normal heart sounds. Exam reveals no gallop and no friction rub.  No murmur heard.  Pulmonary/Chest: Effort normal and breath sounds normal. No respiratory distress. He has no wheezes.  Abdominal: Soft. Bowel sounds are normal. He exhibits no distension. There is no tenderness.  Lymphadenopathy:  He has no cervical adenopathy.  [id number] elbow swelling, decreased ROM. Neurological: He is alert and oriented to person, place, and time.  Skin: Skin is warm and dry. No rash noted. No erythema.  Psychiatric: He has a normal mood and affect. His behavior is normal.    Lab Results Recent Labs    04/20/21 1011  WBC 15.4*  HGB 8.3*  HCT 26.2*    Sedimentation Rate Recent Labs    04/21/21 1454  ESRSEDRATE 140*   Cd 4 count of 66 Microbiology: reviewed Studies/Results: No results found.   Assessment/Plan: HIV disease = continue on tivicay and lamivudine daily. VL is pending. ID appointment has been rescheduled to 9/3 with dr Lucianne Lei dam  OI proph = continue on dapsone daily  Multiple  joint pain = current diagnosis is disseminated gout, currently on prednisone '40mg'$ . May consider to giving colchicine. Awaiting MRI of right elbow to see if there is fluid for aspiration to prove it is disseminate gout and rule out septic arthritis. May need a plan for steroid taper if it is gout.  Leukocytosis = will check CBC today to see if still elevated, has been on steroids  --------------- Addendum: IR will do aspirate of right elbow tomorrow at 10am for cell count, crystals, and aerobic culture  [facility] for Infectious Diseases Cell: [phone] Pager: [phone]  04/22/2021, 1:30 PM

## 2021-04-22 NOTE — Progress Notes (Signed)
Physical Therapy Session Note  Patient Details  Name: Bruce Hunter MRN: [id number] Date of Birth: 29-Feb-1976  Today's Date: 04/22/2021 PT Individual Time:  2423-5361 PT minutes: 38 min     Short Term Goals: Week 1:  PT Short Term Goal 1 (Week 1): =LTGs d/t ELOS PT Short Term Goal 1 - Progress (Week 1): Met PT Short Term Goal 2 (Week 1): Pt will tolerate x 30 minutes of activity PT Short Term Goal 2 - Progress (Week 1): Met PT Short Term Goal 3 (Week 1): Pt will tolerate sitting OOB for 30 + minutes PT Short Term Goal 3 - Progress (Week 1): Met  Skilled Therapeutic Interventions/Progress Updates:    pt received in bed and agreeable to therapy. Pt c/o pain in his feet/ankles and elbow, stating it is improved since yesterday, therapy to tolerance. Independent bed mobility to sit EOB. Pt donned ted hose and shoes mod I with increased time. Mod I squat pivot EOB>w/c and management of leg rests. Oral care at w/c level mod I. Pt propelled w/c with BUE x 300 ft and returned to room. Pt remained in w/c after session and was left with all needs in reach and alarm active.   Therapy Documentation Precautions:  Precautions Precautions: Fall Precaution Comments: Hypersentive and painful feet Restrictions Weight Bearing Restrictions: No   Therapy/Group: Individual Therapy  Mickel Fuchs 04/22/2021, 4:19 PM

## 2021-04-23 ENCOUNTER — Ambulatory Visit: Payer: Medicaid Other | Admitting: Infectious Disease

## 2021-04-23 LAB — BASIC METABOLIC PANEL
Anion gap: 10 (ref 5–15)
BUN: 55 mg/dL — ABNORMAL HIGH (ref 6–20)
CO2: 19 mmol/L — ABNORMAL LOW (ref 22–32)
Calcium: 9 mg/dL (ref 8.9–10.3)
Chloride: 107 mmol/L (ref 98–111)
Creatinine, Ser: 1.32 mg/dL — ABNORMAL HIGH (ref 0.61–1.24)
GFR, Estimated: >60 mL/min (ref >60)
Glucose, Bld: 112 mg/dL — ABNORMAL HIGH (ref 70–99)
Potassium: 4 mmol/L (ref 3.5–5.1)
Sodium: 136 mmol/L (ref 135–145)

## 2021-04-23 LAB — SYNOVIAL CELL COUNT + DIFF, W/ CRYSTALS
Eosinophils-Synovial: 0 % (ref 0–1)
Lymphocytes-Synovial Fld: 19 % (ref 0–20)
Monocyte-Macrophage-Synovial Fluid: 8 % — ABNORMAL LOW (ref 50–90)
Neutrophil, Synovial: 73 % — ABNORMAL HIGH (ref 0–25)
WBC, Synovial: 6820 /mm3 — ABNORMAL HIGH (ref 0–200)

## 2021-04-23 LAB — GLUCOSE, CAPILLARY
Glucose-Capillary: 163 mg/dL — ABNORMAL HIGH (ref 70–99)
Glucose-Capillary: 113 mg/dL — ABNORMAL HIGH (ref 70–99)
Glucose-Capillary: 175 mg/dL — ABNORMAL HIGH (ref 70–99)
Glucose-Capillary: 146 mg/dL — ABNORMAL HIGH (ref 70–99)

## 2021-04-23 LAB — HLA-B27 ANTIGEN: HLA-B27: NEGATIVE

## 2021-04-23 LAB — CMV DNA, QUANTITATIVE, PCR
CMV DNA Quant: NEGATIVE IU/mL
Log10 CMV Qn DNA Pl: UNDETERMINED log10 IU/mL

## 2021-04-23 MED ORDER — LAMIVUDINE 300 MG PO TABS: 300.0000 mg | ORAL_TABLET | Freq: Every day | ORAL | 0 refills | Status: DC

## 2021-04-23 MED ORDER — HYDRALAZINE HCL 25 MG PO TABS: 25.0000 mg | ORAL_TABLET | Freq: Three times a day (TID) | ORAL | 0 refills | Status: DC

## 2021-04-23 MED ORDER — CYANOCOBALAMIN 1000 MCG PO TABS: 1000.0000 ug | ORAL_TABLET | Freq: Every day | ORAL | 2 refills | Status: DC

## 2021-04-23 MED ORDER — CLONAZEPAM 0.5 MG PO TABS: 0.5000 mg | ORAL_TABLET | Freq: Three times a day (TID) | ORAL | 0 refills | Status: DC

## 2021-04-23 MED ORDER — OMEPRAZOLE 20 MG PO CPDR: 20.0000 mg | DELAYED_RELEASE_CAPSULE | Freq: Every day | ORAL | 2 refills | Status: DC

## 2021-04-23 MED ORDER — OXYCODONE HCL 5 MG PO TABS: 5.0000 mg | ORAL_TABLET | Freq: Four times a day (QID) | ORAL | 0 refills | Status: DC | PRN

## 2021-04-23 MED ORDER — DICLOFENAC SODIUM 1 % EX GEL: 2.0000 g | Freq: Four times a day (QID) | CUTANEOUS | 0 refills | Status: DC

## 2021-04-23 MED ORDER — SEMGLEE 100 UNIT/ML ~~LOC~~ SOPN: 5.0000 [IU] | PEN_INJECTOR | Freq: Every day | SUBCUTANEOUS | 11 refills | Status: DC

## 2021-04-23 MED ORDER — DOLUTEGRAVIR SODIUM 50 MG PO TABS: 50.0000 mg | ORAL_TABLET | Freq: Every day | ORAL | 0 refills | Status: DC

## 2021-04-23 MED ORDER — LIDOCAINE 5 % EX PTCH: 2.0000 | MEDICATED_PATCH | CUTANEOUS | 0 refills | Status: DC

## 2021-04-23 MED ORDER — PREDNISONE 20 MG PO TABS: ORAL_TABLET | ORAL | 0 refills | Status: DC

## 2021-04-23 MED ORDER — METOPROLOL TARTRATE 25 MG PO TABS: 25.0000 mg | ORAL_TABLET | Freq: Two times a day (BID) | ORAL | 0 refills | Status: DC

## 2021-04-23 MED ORDER — DAPSONE 100 MG PO TABS: 100.0000 mg | ORAL_TABLET | Freq: Every day | ORAL | 0 refills | Status: DC

## 2021-04-23 MED ORDER — TAMSULOSIN HCL 0.4 MG PO CAPS: 0.4000 mg | ORAL_CAPSULE | Freq: Every day | ORAL | 0 refills | Status: DC

## 2021-04-23 MED ORDER — FOLIC ACID 1 MG PO TABS: 1.0000 mg | ORAL_TABLET | Freq: Every day | ORAL | 2 refills | Status: DC

## 2021-04-23 MED ORDER — MAGNESIUM OXIDE -MG SUPPLEMENT 400 (240 MG) MG PO TABS: 400.0000 mg | ORAL_TABLET | Freq: Two times a day (BID) | ORAL | 0 refills | Status: DC

## 2021-04-23 MED ORDER — FUROSEMIDE 40 MG PO TABS: 40.0000 mg | ORAL_TABLET | ORAL | 0 refills | Status: DC

## 2021-04-23 MED ORDER — GABAPENTIN 300 MG PO CAPS: 600.0000 mg | ORAL_CAPSULE | Freq: Two times a day (BID) | ORAL | 0 refills | Status: DC

## 2021-04-23 NOTE — Progress Notes (Addendum)
PROGRESS NOTE   Subjective/Complaints: Appreciate IR notes , turbid fluid with elevated WBC aspirated but no bacteria seen on aspirate ID has not reviewed yet ROS: Denies CP, SOB, N/V/D  Objective:   MR ELBOW RIGHT W WO CONTRAST  Result Date: 04/22/2021 CLINICAL DATA:  Joint effusion, elbow EXAM: MRI OF THE RIGHT ELBOW WITHOUT AND WITH CONTRAST TECHNIQUE: Multiplanar, multisequence MR imaging of the elbow was performed before and after the administration of intravenous contrast. CONTRAST:  40m GADAVIST GADOBUTROL 1 MMOL/ML IV SOLN COMPARISON:  None. FINDINGS: TENDONS Common forearm flexor origin: Intact Common forearm extensor origin: Intact Biceps: Intact Triceps: Intact LIGAMENTS Medial stabilizers: Intact Lateral stabilizers:  Intact Cartilage: No chondral defect. Joint: There is a moderate-sized joint effusion with synovitis and synovial enhancement. Cubital tunnel: Normal. Bones: No fracture or dislocation. No significant marrow abnormality. Soft Tissues: Mild periarticular intramuscular edema signal, likely reactive. IMPRESSION: Moderate-sized elbow joint effusion with synovitis. Joint aspiration should be considered for further evaluation, as infectious/inflammatory/crystalline arthropathy are diagnostic considerations. No evidence of osteomyelitis at this time. Electronically Signed   By: JMaurine SimmeringM.D.   On: 04/22/2021 14:30   DG FLUORO GUIDED NEEDLE PLC ASPIRATION/INJECTION LOC  Result Date: 04/22/2021 CLINICAL DATA:  Right elbow pain, aspiration EXAM: RIGHT ELBOW JOINT ASPIRATION UNDER FLUOROSCOPY COMPARISON:  Right elbow MRI 04/22/2021 FLUOROSCOPY TIME:  Fluoroscopy Time:  12 seconds Radiation Exposure Index (if provided by the fluoroscopic device): 0.2 mGy Number of Acquired Spot Images: 0 PROCEDURE: Overlying skin prepped with Betadine and draped in the usual sterile fashion. Local anesthesia was administered utilizing 1%  lidocaine. An 18 gauge needle was advanced into the right elbow utilizing a posterior approach through the triceps. Approximately 4 mL of cloudy yellow fluid was aspirated from the elbow joint. The fluid was sent to the laboratory for analysis. The patient tolerated the procedure well. There was no immediate complication. IMPRESSION: Successful right elbow aspiration under fluoroscopy. Approximately 4 mL of cloudy yellow fluid was aspirated from the joint and sent to the laboratory for analysis. No immediate complication. Electronically Signed   By: JMaurine SimmeringM.D.   On: 04/22/2021 21:12   Recent Labs    04/20/21 1011 04/22/21 1504  WBC 15.4* 21.5*  HGB 8.3* 8.4*  HCT 26.2* 27.1*  PLT 306 431*      No results for input(s): NA, K, CL, CO2, GLUCOSE, BUN, CREATININE, CALCIUM in the last 72 hours.   Intake/Output Summary (Last 24 hours) at 04/23/2021 0745 Last data filed at 04/23/2021 [id number] per 24 hour  Intake 240 ml  Output 1725 ml  Net -1485 ml          Physical Exam: Vital Signs Blood pressure (!) 143/95, pulse 83, temperature 99.4 F (37.4 C), temperature source Oral, resp. rate 16, height '5\' 10"'$  (1.778 m), weight 91.9 kg, SpO2 100 %.  General: No acute distress Mood and affect are appropriate Heart: Regular rate and rhythm no rubs murmurs or extra sounds Lungs: Clear to auscultation, breathing unlabored, no rales or wheezes Abdomen: Positive bowel sounds, soft nontender to palpation, nondistended Extremities: No clubbing, cyanosis, +2 pedal edema unchanged  Skin: No evidence of breakdown, no evidence of  rash MSK- Right elbow no effusion full ROM without pain  Neuro: Alert Motor: 4/5 in right and 5/5 left deltoid, bicep, tricep, grip, 4/5 B hip flexor, knee extensors, trace ankle dorsiflexor and plantar flexor (due to pain with active foot/ankle movement), unchanged  Assessment/Plan: 1. Functional deficits which require 3+ hours per day of interdisciplinary therapy in  a comprehensive inpatient rehab setting. Physiatrist is providing close team supervision and 24 hour management of active medical problems listed below. Physiatrist and rehab team continue to assess barriers to discharge/monitor patient progress toward functional and medical goals  Care Tool:  Bathing    Body parts bathed by patient: Right arm, Left arm, Chest, Abdomen, Right upper leg, Left upper leg, Face, Front perineal area, Right lower leg, Left lower leg, Buttocks   Body parts bathed by helper: Buttocks, Right lower leg, Left lower leg     Bathing assist Assist Level: Independent with assistive device (per staff)     Upper Body Dressing/Undressing Upper body dressing   What is the patient wearing?: Pull over shirt    Upper body assist Assist Level: Independent with assistive device (per staff)    Lower Body Dressing/Undressing Lower body dressing      What is the patient wearing?: Underwear/pull up, Pants     Lower body assist Assist for lower body dressing: Independent with assitive device (per staff)     Toileting Toileting    Toileting assist Assist for toileting: Independent with assistive device (per staff)     Transfers Chair/bed transfer  Transfers assist     Chair/bed transfer assist level: Independent with assistive device Chair/bed transfer assistive device: Armrests   Locomotion Ambulation   Ambulation assist   Ambulation activity did not occur: Refused  Assist level: Supervision/Verbal cueing Assistive device: Walker-rolling Max distance: 300 ft   Walk 10 feet activity   Assist  Walk 10 feet activity did not occur: Refused  Assist level: Independent with assistive device Assistive device: Walker-rolling   Walk 50 feet activity   Assist Walk 50 feet with 2 turns activity did not occur: Refused  Assist level: Independent with assistive device Assistive device: Walker-rolling    Walk 150 feet activity   Assist Walk 150  feet activity did not occur: Refused  Assist level: Supervision/Verbal cueing Assistive device: Walker-rolling    Walk 10 feet on uneven surface  activity   Assist Walk 10 feet on uneven surfaces activity did not occur: Refused   Assist level: Supervision/Verbal cueing Assistive device: Aeronautical engineer Will patient use wheelchair at discharge?: Yes Type of Wheelchair: Manual Wheelchair activity did not occur: Refused (Pt stated he would not leave his room)  Wheelchair assist level: Independent Max wheelchair distance: 250 ft    Wheelchair 50 feet with 2 turns activity    Assist    Wheelchair 50 feet with 2 turns activity did not occur: Refused   Assist Level: Independent   Wheelchair 150 feet activity     Assist  Wheelchair 150 feet activity did not occur: Refused   Assist Level: Independent   Blood pressure (!) 143/95, pulse 83, temperature 99.4 F (37.4 C), temperature source Oral, resp. rate 16, height '5\' 10"'$  (1.778 m), weight 91.9 kg, SpO2 100 %.  Medical Problem List and Plan: 1.  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  Impaired mobility, was ambulating >200 feet: d/c Lovenox  Patient doesn't want lovenox.  3. Left shoulder/back/foot pain/Pain/diabetic peripheral neuropathy: continue Oxycodone prn. Increase steroids for gout flare. Will do 60 mg daily x 2 days, then taper. Pt rates pain as "11/10". Needs to bring in shoes so able to walk better; If steroids PO doesn't work, might need L shoulder injection. Pt hates needles.   8/1- added Lidoderm patches for Shoulder and back pain. 8pm to 8am  8/6 increase gabapentin to '300mg'$  TID, increase to 600 3 times daily on 8/13 Increase Oxy to 15 mg every 4 as needed  8/16 tenosynovitis and probable gouty arthritis, check urate, agree with ID  will need steroid resumed, will restart taper at '40mg'$  and taper more slowly, ?rheum eval as OP  Clinically improved RIght elbow Elevated WBC in joint , + monosodium urate , neg orgs looks like gout but need ID to review 4. Mood: LCSW to follow for evaluation and support.              -antipsychotic agents: N/A 5. Neuropsych: This patient is capable of making decisions on his own behalf. 6. Skin/Wound Care: Routine pressure relief measures.  7. Fluids/Electrolytes/Nutrition: Strict I/Os 8. PJP PNA/leukocytosis:  Duration of meds discussed with pharmacy who relayed that patient has completed 21+ days of steroids and primaquine. --Clindamycin thorough 07/29 to complete 21 days. ID consulted, appreciate recs-recommended VIR evaluation.   VIR are recommending foot ultrasound, showing avascular masses versus complex fluid collections  9. Gout flare: Was on colchicine 0.6 mg daily PTA. --prednisone taper completed 8/9- colchicine dose x2 again to see if will help feet pain.  10. T2DM: Hgb A1c- 5.4. Was on glucotrol/metformin bid PTA.             --continue Lantus while on steroids with SSI for elevated BS.  CBG (last 3)  Recent Labs    04/22/21 1624 04/22/21 2112 04/23/21 0553  GLUCAP 204* 120* 163*   Fair control some lability   11. Anxiety d/o: Was on Klonopin TID PTA. --bid effective at this time. 12. HTN: Monitor BP tid--continue hydralazine and Norvasc.   Vitals:   04/23/21 0056 04/23/21 0614  BP: (!) 146/98 (!) 143/95  Pulse: 83 83  Resp: 16 16  Temp: 99.9 F (37.7 C) 99.4 F (37.4 C)  SpO2: 99% 100%  Controlled   13. HIV: On Epivir and Tivicay 14. Anemia: H/H steadily improving.   Hemoglobin 8.3 on 8/16 stable  15. Hypomagnesemia: MgOx 400 mg BID  Magnesium 1.7 on 8/9 16. Leukocytosis Appreciate ID recs-ordered Rh factor and quantiferon TB testing- also GC swabs  WBCs 13.1 on 8/13, confounded by steroids at present  Blood cultures NGTD  Fevers resolved  Started on  Rocephin by ID  Await further ID recs   17. Dysphagia: Resolved 18. Urinary retention-resolved 19. Bowel incontinence- Improved 20. Chipped nails-?  Improving.  21. GERD  87/28-restarted Protonix- also has tums prn. 22. Pressure injury to buttocks: offload q57mnutes 23. Bilateral lower extremity swelling: ice, elevated, compression garments 24. Shoulder pain: continue lidocaine patch and voltaren gel.  25. Nearsighted- schedule outpatient ophthalmology eval- should be checked for diabetic retinopathy 26. Diabetic peripheral neuropathy: will benefit from outpatient Qutenza. Ordered diabetic shoes  -gabapentin increased as above  Mildly elevated on 8/13 27. Lower extremity edema:resumed '10mg'$  daily Lasix.  28. Acute exacerbation of CKD baseline BUN/ Creat about 30/1.3   Creatinine 1.65 on 8/15  IVF initiated on 8/17, repeat bmet  today  29.  Mild hyponatremia  Sodium 134 on 8/14, continue to monitor  LOS: 24 days A FACE TO [place] E Bruce Hunter 04/23/2021, 7:45 AM

## 2021-04-23 NOTE — Progress Notes (Signed)
[facility]    Date of Admission:  03/30/2021      ID: Bruce Hunter is a 45 y.o. 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 Improvement in right elbow pain since aspiration of fluid. Bilateral ankle still sore  Aspiration of right elbow effusion c/w +crystals - gout+  Medications:   (feeding supplement) PROSource Plus  30 mL Oral TID BM   clonazePAM  0.5 mg Oral TID   dapsone  100 mg Oral Daily   diclofenac Sodium  2 g Topical QID   dolutegravir  50 mg Oral Daily   furosemide  40 mg Oral Q M,W,F   gabapentin  600 mg Oral Q12H   hydrALAZINE  25 mg Oral Q8H   insulin aspart  0-15 Units Subcutaneous TID WC   insulin aspart  0-5 Units Subcutaneous QHS   insulin glargine-yfgn  5 Units Subcutaneous Daily   lamiVUDine  300 mg Oral Daily   lidocaine  1 patch Transdermal Q24H   lidocaine  2 patch Transdermal Q24H   lidocaine (PF)  2 mL Other Once   magnesium oxide  400 mg Oral BID   metoprolol tartrate  25 mg Oral BID   multivitamin with minerals  1 tablet Oral Daily   pantoprazole  40 mg Oral Daily   predniSONE  40 mg Oral Q breakfast   tamsulosin  0.4 mg Oral QPC supper    Objective: Vital signs in last 24 hours: Temp:  [99.3 F (37.4 C)-100.2 F (37.9 C)] 99.3 F (37.4 C) (08/19 1259) Pulse Rate:  [83-106] 106 (08/19 1259) Resp:  [16-18] 18 (08/19 1259) BP: (138-148)/(95-104) 148/96 (08/19 1259) SpO2:  [98 %-100 %] 100 % (08/19 1259) Physical Exam  Constitutional: He is oriented to person, place, and  time. He appears well-developed and well-nourished. No distress.  HENT:  Mouth/Throat: Oropharynx is clear and moist. No oropharyngeal exudate.  Cardiovascular: Normal rate, regular rhythm and normal heart sounds. Exam reveals no gallop and no friction rub.  No murmur heard.  Pulmonary/Chest: Effort normal and breath sounds normal. No respiratory distress. He has no wheezes.  Abdominal: Soft. Bowel sounds are normal. He exhibits no distension. There is no tenderness.  Lymphadenopathy:  He has no cervical adenopathy.  Ext: lower extremity swelling of dorsum of feet bilaterally,left ankle +swelling Neurological: He is alert and oriented to person, place, and time.  Skin: Skin is warm and dry. No rash noted. No erythema.  Psychiatric: He has a normal mood and affect. His behavior is normal.    Lab Results Recent Labs    04/22/21 1504 04/23/21 0829  WBC 21.5*  --   HGB 8.4*  --   HCT 27.1*  --   NA  --  136  K  --  4.0  CL  --  107  CO2  --  19*  BUN  --  55*  CREATININE  --  1.32*   Liver Panel No results for input(s): PROT, ALBUMIN, AST, ALT, ALKPHOS, BILITOT, BILIDIR, IBILI in the  last 72 hours. Sedimentation Rate Recent Labs    04/21/21 1454  ESRSEDRATE 140*    Microbiology: 8/18 synovial fluid cx PENDING Studies/Results: MR ELBOW RIGHT W WO CONTRAST  Result Date: 04/22/2021 CLINICAL DATA:  Joint effusion, elbow EXAM: MRI OF THE RIGHT ELBOW WITHOUT AND WITH CONTRAST TECHNIQUE: Multiplanar, multisequence MR imaging of the elbow was performed before and after the administration of intravenous contrast. CONTRAST:  59m GADAVIST GADOBUTROL 1 MMOL/ML IV SOLN COMPARISON:  None. FINDINGS: TENDONS Common forearm flexor origin: Intact Common forearm extensor origin: Intact Biceps: Intact Triceps: Intact LIGAMENTS Medial stabilizers: Intact Lateral stabilizers:  Intact Cartilage: No chondral defect. Joint: There is a moderate-sized joint effusion with synovitis and synovial  enhancement. Cubital tunnel: Normal. Bones: No fracture or dislocation. No significant marrow abnormality. Soft Tissues: Mild periarticular intramuscular edema signal, likely reactive. IMPRESSION: Moderate-sized elbow joint effusion with synovitis. Joint aspiration should be considered for further evaluation, as infectious/inflammatory/crystalline arthropathy are diagnostic considerations. No evidence of osteomyelitis at this time. Electronically Signed   By: JMaurine SimmeringM.D.   On: 04/22/2021 14:30   DG FLUORO GUIDED NEEDLE PLC ASPIRATION/INJECTION LOC  Result Date: 04/22/2021 CLINICAL DATA:  Right elbow pain, aspiration EXAM: RIGHT ELBOW JOINT ASPIRATION UNDER FLUOROSCOPY COMPARISON:  Right elbow MRI 04/22/2021 FLUOROSCOPY TIME:  Fluoroscopy Time:  12 seconds Radiation Exposure Index (if provided by the fluoroscopic device): 0.2 mGy Number of Acquired Spot Images: 0 PROCEDURE: Overlying skin prepped with Betadine and draped in the usual sterile fashion. Local anesthesia was administered utilizing 1% lidocaine. An 18 gauge needle was advanced into the right elbow utilizing a posterior approach through the triceps. Approximately 4 mL of cloudy yellow fluid was aspirated from the elbow joint. The fluid was sent to the laboratory for analysis. The patient tolerated the procedure well. There was no immediate complication. IMPRESSION: Successful right elbow aspiration under fluoroscopy. Approximately 4 mL of cloudy yellow fluid was aspirated from the joint and sent to the laboratory for analysis. No immediate complication. Electronically Signed   By: JMaurine SimmeringM.D.   On: 04/22/2021 21:12     Assessment/Plan: Disseminated gout = + synovial fluid aspiration help confirm dx of disseminated gout involving bilateral ankle/feet and right elbow. Continue with steroids. Kidney function precludes routine use of colchicine. Recommend taper over 10-14 days  Has cx from 8/18, we will follow up.  Hiv disease = continue  on dovato (Rx sent to [facility]) Low CD 4 count likely in the setting of gouty attack. VL 100. Nearly undetectable  Oi proph = continue with dapsone  Has follow up with dr vLucianne Leidam. Will sign off  [facility] Cell: [phone]: [phone]  04/23/2021, 4:19 PM

## 2021-04-23 NOTE — Progress Notes (Signed)
Physical Therapy Session Note  Patient Details  Name: Bruce Hunter MRN: [id number] Date of Birth: Aug 19, 1976  Today's Date: 04/23/2021 PT Individual Time: 0802-0827 PT Individual Time Calculation (min): 25 min   Short Term Goals:  Week 2:  PT Short Term Goal 1 (Week 2): =LTGs d/t ELOS  Skilled Therapeutic Interventions/Progress Updates:   Pt received supine in bed and agreeable to PT. Bed level therex. 2 x10 BLE, heel slides x 12, hip abduction x 12, bridges x 10 cues for full ROM and hold at end range. MD present for assessment.   Supine>sit transfer without assist or cues. Donning ted hose from EOB with increased time. Patient left sitting in EOB with call bell in reach and all needs met.          Therapy Documentation Precautions:  Precautions Precautions: Fall Precaution Comments: Hypersentive and painful feet Restrictions Weight Bearing Restrictions: No    Vital Signs: Therapy Vitals Temp: 99.4 F (37.4 C) Temp Source: Oral Pulse Rate: 83 Resp: 16 BP: (!) 143/95 Patient Position (if appropriate): Lying Oxygen Therapy SpO2: 100 % O2 Device: Room Air Pain:   8/10 BLE. Burning. Aching. MD aware     Therapy/Group: Individual Therapy  Lorie Phenix 04/23/2021, 9:00 AM

## 2021-04-23 NOTE — Progress Notes (Signed)
Patient ID: Bruce Hunter, male   DOB: 08/06/1976, 45 y.o.   MRN: [id number] Pt is set up for home once medically stable for discharge. Aware PCS will contact him at home to set up [facility] aide. Has all equipment. Pt aware could not find home health agency to take his medicaid. He feels he can manage at home and each day he has felt better. His Mom is aware of plan. Pt had wanted to go to Mom's home but she told him no. He knows when having a bad day to use wheelchair and only do transfers. Await medical stability for discharge home.

## 2021-04-24 LAB — CULTURE, BLOOD (ROUTINE X 2)
Culture: NO GROWTH
Culture: NO GROWTH
Special Requests: ADEQUATE

## 2021-04-24 LAB — ANTINUCLEAR ANTIBODIES, IFA: ANA Ab, IFA: NEGATIVE

## 2021-04-24 LAB — GLUCOSE, CAPILLARY
Glucose-Capillary: 128 mg/dL — ABNORMAL HIGH (ref 70–99)
Glucose-Capillary: 153 mg/dL — ABNORMAL HIGH (ref 70–99)
Glucose-Capillary: 206 mg/dL — ABNORMAL HIGH (ref 70–99)
Glucose-Capillary: 166 mg/dL — ABNORMAL HIGH (ref 70–99)

## 2021-04-24 MED ORDER — PREDNISONE 20 MG PO TABS
30.0000 mg | ORAL_TABLET | Freq: Every day | ORAL | Status: DC
  Administered 2021-04-25 – 2021-05-03 (×9): 30 mg via ORAL
  Filled 2021-04-24 (×9): qty 1

## 2021-04-24 NOTE — Progress Notes (Signed)
PROGRESS NOTE   Subjective/Complaints: Appreciate ID note, no joint pain today  Good appetite  ROS: Denies CP, SOB, N/V/D  Objective:   MR ELBOW RIGHT W WO CONTRAST  Result Date: 04/22/2021 CLINICAL DATA:  Joint effusion, elbow EXAM: MRI OF THE RIGHT ELBOW WITHOUT AND WITH CONTRAST TECHNIQUE: Multiplanar, multisequence MR imaging of the elbow was performed before and after the administration of intravenous contrast. CONTRAST:  16m GADAVIST GADOBUTROL 1 MMOL/ML IV SOLN COMPARISON:  None. FINDINGS: TENDONS Common forearm flexor origin: Intact Common forearm extensor origin: Intact Biceps: Intact Triceps: Intact LIGAMENTS Medial stabilizers: Intact Lateral stabilizers:  Intact Cartilage: No chondral defect. Joint: There is a moderate-sized joint effusion with synovitis and synovial enhancement. Cubital tunnel: Normal. Bones: No fracture or dislocation. No significant marrow abnormality. Soft Tissues: Mild periarticular intramuscular edema signal, likely reactive. IMPRESSION: Moderate-sized elbow joint effusion with synovitis. Joint aspiration should be considered for further evaluation, as infectious/inflammatory/crystalline arthropathy are diagnostic considerations. No evidence of osteomyelitis at this time. Electronically Signed   By: JMaurine SimmeringM.D.   On: 04/22/2021 14:30   DG FLUORO GUIDED NEEDLE PLC ASPIRATION/INJECTION LOC  Result Date: 04/22/2021 CLINICAL DATA:  Right elbow pain, aspiration EXAM: RIGHT ELBOW JOINT ASPIRATION UNDER FLUOROSCOPY COMPARISON:  Right elbow MRI 04/22/2021 FLUOROSCOPY TIME:  Fluoroscopy Time:  12 seconds Radiation Exposure Index (if provided by the fluoroscopic device): 0.2 mGy Number of Acquired Spot Images: 0 PROCEDURE: Overlying skin prepped with Betadine and draped in the usual sterile fashion. Local anesthesia was administered utilizing 1% lidocaine. An 18 gauge needle was advanced into the right elbow  utilizing a posterior approach through the triceps. Approximately 4 mL of cloudy yellow fluid was aspirated from the elbow joint. The fluid was sent to the laboratory for analysis. The patient tolerated the procedure well. There was no immediate complication. IMPRESSION: Successful right elbow aspiration under fluoroscopy. Approximately 4 mL of cloudy yellow fluid was aspirated from the joint and sent to the laboratory for analysis. No immediate complication. Electronically Signed   By: JMaurine SimmeringM.D.   On: 04/22/2021 21:12   Recent Labs    04/22/21 1504  WBC 21.5*  HGB 8.4*  HCT 27.1*  PLT 431*      Recent Labs    04/23/21 0829  NA 136  K 4.0  CL 107  CO2 19*  GLUCOSE 112*  BUN 55*  CREATININE 1.32*  CALCIUM 9.0     Intake/Output Summary (Last 24 hours) at 04/24/2021 0941 Last data filed at 04/24/2021 0700 Gross per 24 hour  Intake 360 ml  Output 2230 ml  Net -1870 ml          Physical Exam: Vital Signs Blood pressure (!) 142/87, pulse 84, temperature 98.7 F (37.1 C), temperature source Oral, resp. rate 17, height '5\' 10"'$  (1.778 m), weight 91.9 kg, SpO2 100 %.  General: No acute distress Mood and affect are appropriate Heart: Regular rate and rhythm no rubs murmurs or extra sounds Lungs: Clear to auscultation, breathing unlabored, no rales or wheezes Abdomen: Positive bowel sounds, soft nontender to palpation, nondistended Extremities: No clubbing, cyanosis, +2 pedal edema unchanged  Skin: No evidence of breakdown, no evidence of  rash MSK- Right elbow no effusion full ROM without pain  Neuro: Alert Motor: 4/5 in right and 5/5 left deltoid, bicep, tricep, grip, 4/5 B hip flexor, knee extensors, trace ankle dorsiflexor and plantar flexor (due to pain with active foot/ankle movement), unchanged  Assessment/Plan: 1. Functional deficits which require 3+ hours per day of interdisciplinary therapy in a comprehensive inpatient rehab setting. Physiatrist is providing  close team supervision and 24 hour management of active medical problems listed below. Physiatrist and rehab team continue to assess barriers to discharge/monitor patient progress toward functional and medical goals  Care Tool:  Bathing    Body parts bathed by patient: Right arm, Left arm, Chest, Abdomen, Right upper leg, Left upper leg, Face, Front perineal area, Right lower leg, Left lower leg, Buttocks   Body parts bathed by helper: Buttocks, Right lower leg, Left lower leg     Bathing assist Assist Level: Independent with assistive device (per staff)     Upper Body Dressing/Undressing Upper body dressing   What is the patient wearing?: Pull over shirt    Upper body assist Assist Level: Independent with assistive device (per staff)    Lower Body Dressing/Undressing Lower body dressing      What is the patient wearing?: Underwear/pull up, Pants     Lower body assist Assist for lower body dressing: Independent with assitive device (per staff)     Toileting Toileting    Toileting assist Assist for toileting: Independent with assistive device (per staff)     Transfers Chair/bed transfer  Transfers assist     Chair/bed transfer assist level: Independent with assistive device Chair/bed transfer assistive device: Armrests   Locomotion Ambulation   Ambulation assist   Ambulation activity did not occur: Refused  Assist level: Supervision/Verbal cueing Assistive device: Walker-rolling Max distance: 300 ft   Walk 10 feet activity   Assist  Walk 10 feet activity did not occur: Refused  Assist level: Independent with assistive device Assistive device: Walker-rolling   Walk 50 feet activity   Assist Walk 50 feet with 2 turns activity did not occur: Refused  Assist level: Independent with assistive device Assistive device: Walker-rolling    Walk 150 feet activity   Assist Walk 150 feet activity did not occur: Refused  Assist level:  Supervision/Verbal cueing Assistive device: Walker-rolling    Walk 10 feet on uneven surface  activity   Assist Walk 10 feet on uneven surfaces activity did not occur: Refused   Assist level: Supervision/Verbal cueing Assistive device: Aeronautical engineer Will patient use wheelchair at discharge?: Yes Type of Wheelchair: Manual Wheelchair activity did not occur: Refused (Pt stated he would not leave his room)  Wheelchair assist level: Independent Max wheelchair distance: 250 ft    Wheelchair 50 feet with 2 turns activity    Assist    Wheelchair 50 feet with 2 turns activity did not occur: Refused   Assist Level: Independent   Wheelchair 150 feet activity     Assist  Wheelchair 150 feet activity did not occur: Refused   Assist Level: Independent   Blood pressure (!) 142/87, pulse 84, temperature 98.7 F (37.1 C), temperature source Oral, resp. rate 17, height '5\' 10"'$  (1.778 m), weight 91.9 kg, SpO2 100 %.  Medical Problem List and Plan: 1.  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  Impaired mobility, was ambulating >200 feet: d/c Lovenox  Patient doesn't want lovenox.  3. Left shoulder/back/foot pain/Pain/diabetic peripheral neuropathy: continue Oxycodone prn. Increase steroids for gout flare. Will do 60 mg daily x 2 days, then taper. Pt rates pain as "11/10". Needs to bring in shoes so able to walk better; If steroids PO doesn't work, might need L shoulder injection. Pt hates needles.   8/1- added Lidoderm patches for Shoulder and back pain. 8pm to 8am  8/6 increase gabapentin to '300mg'$  TID, increase to 600 3 times daily on 8/13 Increase Oxy to 15 mg every 4 as needed  8/16 tenosynovitis and probable gouty arthritis, check urate, agree with ID will need steroid resumed, will restart taper at '40mg'$  and  taper more slowly, ?rheum eval as OP - reduce to '30mg'$  in am and monitor if joint pain increases  Clinically improved RIght elbow Elevated WBC in joint , + monosodium urate , neg orgs c/w gout 4. Mood: LCSW to follow for evaluation and support.              -antipsychotic agents: N/A 5. Neuropsych: This patient is capable of making decisions on his own behalf. 6. Skin/Wound Care: Routine pressure relief measures.  7. Fluids/Electrolytes/Nutrition: Strict I/Os 8. PJP PNA/leukocytosis:  Duration of meds discussed with pharmacy who relayed that patient has completed 21+ days of steroids and primaquine. --Clindamycin thorough 07/29 to complete 21 days. ID consulted, appreciate recs-recommended VIR evaluation.   VIR are recommending foot ultrasound, showing avascular masses versus complex fluid collections  9. Gout flare: Was on colchicine 0.6 mg daily PTA. --prednisone taper completed 8/9- colchicine dose x2 again to see if will help feet pain.  10. T2DM: Hgb A1c- 5.4. Was on glucotrol/metformin bid PTA.             --continue Lantus while on steroids with SSI for elevated BS.  CBG (last 3)  Recent Labs    04/23/21 1620 04/23/21 2108 04/24/21 0622  GLUCAP 175* 146* 128*   Fair control some lability   11. Anxiety d/o: Was on Klonopin TID PTA. --bid effective at this time. 12. HTN: Monitor BP tid--continue hydralazine and Norvasc.   Vitals:   04/24/21 0310 04/24/21 0709  BP: (!) 145/95 (!) 142/87  Pulse: 88 84  Resp: 17 17  Temp: 98.5 F (36.9 C) 98.7 F (37.1 C)  SpO2: 100% 100%  Controlled 8/20  13. HIV: On Epivir and Tivicay 14. Anemia: H/H steadily improving.   Hemoglobin 8.3 on 8/16 stable  15. Hypomagnesemia: MgOx 400 mg BID  Magnesium 1.7 on 8/9 16. Leukocytosis Appreciate ID recs-ordered Rh factor and quantiferon TB testing- also GC swabs  WBCs 13.1 on 8/13, confounded by steroids at present  Blood cultures NGTD  Fevers resolved  Started on Rocephin by ID  Await  further ID recs   17. Dysphagia: Resolved 18. Urinary retention-resolved 19. Bowel incontinence- Improved 20. Chipped nails-?  Improving.  21. GERD  87/28-restarted Protonix- also has tums prn. 22. Pressure injury to buttocks: offload q31mnutes 23. Bilateral lower extremity swelling: ice, elevated, compression garments 24. Shoulder pain: continue lidocaine patch and voltaren gel.  25. Nearsighted- schedule outpatient ophthalmology eval- should be checked for diabetic retinopathy 26. Diabetic peripheral neuropathy: will benefit from outpatient Qutenza. Ordered diabetic shoes  -gabapentin increased as above  Mildly elevated on 8/13 27. Lower extremity edema:resumed '10mg'$  daily Lasix.  28. Acute exacerbation of CKD baseline BUN/ Creat about 30/1.3   Creatinine 1.65 on 8/15  IVF initiated on 8/17, repeat bmet 8/22 29.  Mild hyponatremia  Sodium 134 on 8/14, continue to monitor  LOS: 25 days A FACE TO [place] E Shamari Lofquist 04/24/2021, 9:41 AM

## 2021-04-25 LAB — GLUCOSE, CAPILLARY
Glucose-Capillary: 131 mg/dL — ABNORMAL HIGH (ref 70–99)
Glucose-Capillary: 79 mg/dL (ref 70–99)
Glucose-Capillary: 159 mg/dL — ABNORMAL HIGH (ref 70–99)
Glucose-Capillary: 158 mg/dL — ABNORMAL HIGH (ref 70–99)

## 2021-04-25 NOTE — Progress Notes (Signed)
Occupational Therapy Session Note  Patient Details  Name: Bruce Hunter MRN: [id number] Date of Birth: Feb 02, 1976  Today's Date: 04/25/2021 OT Individual Time: 0017-4944 OT Individual Time Calculation (min): 56 min   Today's Date: 04/25/2021 OT Individual Time: 9675-9163 OT Individual Time Calculation (min): 33 min   Short Term Goals: Week 1:  OT Short Term Goal 1 (Week 1): ELOS=STGs due to ELOS OT Short Term Goal 1 - Progress (Week 1): Met OT Short Term Goal 2 (Week 1): Pt will perform LB dress with Mod A at sit > stand OT Short Term Goal 2 - Progress (Week 1): Met OT Short Term Goal 3 (Week 1): Pt will perform ADL routine with no more than Min cues for sequencing/problem solving OT Short Term Goal 3 - Progress (Week 1): Met OT Short Term Goal 4 (Week 1): Pt will complete ADL routine/activity of choice for 10 minutes without fatigue OT Short Term Goal 4 - Progress (Week 1): Met  Skilled Therapeutic Interventions/Progress Updates:    Pt received EOB. Pt with ain in BLE unrated but states, "I cant stand on them." Pt educated on edema in BLE and use of compression socks for edema management as well as elevation. Pt dons compression socks with bag trick and elevating leg rests. Also offered  ACE wrapping for lighter compression as another option. Pt completes lateral scoot transfer and grooming with MOD I to w/c. Pt uses urinal with set up for gathering and clean up. Pt p[ropels to ADL apartment with MOD I. Pt very distracted by environment throughout requiring cuing to not pull on IV with arm movement reaching for things. In apartment, OT educates on adaptive strategies for meal preparation at w/c level, energy conservation techniques and healthy meal planning. Pt with limted receptiveness to education, but demo functional reach into oven, microwave, low cabinet and refrigerator/freezer. Exited session with pt seated in bed, exit alarm on and call light in reach  Session 2:  Pt received in  w/c  with unrated pain in BLE repositioning provided for relief ADL:  Pt wanting to transfer w/c>EOB>BSC. Edu re w/c>BSC set up with [facility] Pt completes toileting with set up as pt requests privacy and declines OT presence at end of session Pt completes toileting transfer with Vc for set up of w/c to [facility] which pt ignores  Therapeutic exercise Pt completes 6x1 min beach ball volley in seated position with 2 # wrist weights and badmitton racket for dynamic balance, postural control, BUE strengthening and endurance required for BADLs and functional transfers.   Pt left at end of session in bed with exit alarm on, call light in reach and all needs met  Therapy Documentation Precautions:  Precautions Precautions: Fall Precaution Comments: Hypersentive and painful feet Restrictions Weight Bearing Restrictions: No General:   Vital Signs:  Pain: Pain Assessment Pain Scale: 0-10 Pain Score: Asleep Pain Type: Acute pain Pain Location: Foot Pain Orientation: Right;Left Pain Descriptors / Indicators: Aching Patients Stated Pain Goal: 2 Pain Intervention(s): Medication (See eMAR) ADL: ADL Eating: Independent (per staff) Grooming: Modified independent (per staff) Where Assessed-Grooming: Sitting at sink Upper Body Bathing: Modified independent (per staff) Where Assessed-Upper Body Bathing: Sitting at sink Lower Body Bathing: Modified independent (per staff) Where Assessed-Lower Body Bathing: Sitting at sink, Standing at sink Upper Body Dressing: Modified independent (Device) (per staff) Where Assessed-Upper Body Dressing: Standing at sink Lower Body Dressing: Modified independent (per staff) Where Assessed-Lower Body Dressing: Sitting at sink, Standing at sink Toileting: Modified independent (per  staff) Toilet Transfer: Modified independent (per staff) Toilet Transfer Method: Squat pivot Tub/Shower Transfer: Close supervison Social research officer, government: Not assessed ADL Comments: Using  clinical judgement, pt can perform BADLs from bedlevel at [employer] I level Vision   Perception    Praxis   Exercises:   Other Treatments:     Therapy/Group: Individual Therapy  Tonny Branch 04/25/2021, 7:18 AM

## 2021-04-25 NOTE — Progress Notes (Signed)
Occupational Therapy Session Note  Patient Details  Name: Bruce Hunter MRN: [id number] Date of Birth: 1976-02-22  Today's Date: 04/26/2021 OT Individual Time: 1418-1500 OT Individual Time Calculation (min): 42 min     Skilled Therapeutic Interventions/Progress Updates:    Pt greeted in bed, reporting feeling awful due to high pain level in B LEs. Premedicated for pain. He asked to go to [employer] to buy some food to celebrate his birthday. RN okay'd trip to [place] and OT reeducated pt on his diabetic diet and to space out time eating food with higher sugar content. Close supervision for lateral scoot<w/c, pt requiring increased time and seeming SOB. He reports that he spiked another fever this morning, felt generally tired. Max exertion while self propelling the w/c down to [employer], needed assistance to navigate DME in/out of elevator. Vcs for more efficient pushes. While in [place], pt ordered his food w/c level with Min A. The customers nearby sang "Happy Rudene Anda" to him which appeared to brighten affect. Pt then self propelled back to the room, once again needing increased time to meet task demands and vcs. He transferred back to bed with close supervision and heavy stabilization of w/c. Pt remained sitting EOB, left with all needs within reach and bed alarm set, mother present to visit. Tx focus placed on UB strengthening, activity tolerance, and psychosocial wellbeing.   Therapy Documentation Precautions:  Precautions Precautions: Fall Precaution Comments: Hypersentive and painful feet Restrictions Weight Bearing Restrictions: No  Vital Signs: Therapy Vitals Temp: (!) 100.5 F (38.1 C) Temp Source: Oral Pulse Rate: (!) 110 Resp: 18 BP: (!) 142/86 Patient Position (if appropriate): Sitting Oxygen Therapy SpO2: 97 % O2 Device: Room Air Pain: Pain Assessment Pain Score: 10-Worst pain ever ADL: ADL Eating: Independent (per staff) Grooming: Modified independent (per staff) Where  Assessed-Grooming: Sitting at sink Upper Body Bathing: Modified independent (per staff) Where Assessed-Upper Body Bathing: Sitting at sink Lower Body Bathing: Modified independent (per staff) Where Assessed-Lower Body Bathing: Sitting at sink, Standing at sink Upper Body Dressing: Modified independent (Device) (per staff) Where Assessed-Upper Body Dressing: Standing at sink Lower Body Dressing: Modified independent (per staff) Where Assessed-Lower Body Dressing: Sitting at sink, Standing at sink Toileting: Modified independent (per staff) Toilet Transfer: Modified independent (per staff) Toilet Transfer Method: Squat pivot Tub/Shower Transfer: Close supervison Social research officer, government: Not assessed ADL Comments: Using clinical judgement, pt can perform BADLs from bedlevel at Mod I level     Therapy/Group: Individual Therapy  Criag Wicklund A Trei Schoch 04/26/2021, 4:13 PM

## 2021-04-25 NOTE — Progress Notes (Signed)
PROGRESS NOTE   Subjective/Complaints: Pt states birthday is tomorrow,  Discussed reducing pain meds was on percocet 7.'5mg'$  prescribed by PCP PTA now taking '15mg'$   ROS: Denies CP, SOB, N/V/D  Objective:   No results found. Recent Labs    04/22/21 1504  WBC 21.5*  HGB 8.4*  HCT 27.1*  PLT 431*      Recent Labs    04/23/21 0829  NA 136  K 4.0  CL 107  CO2 19*  GLUCOSE 112*  BUN 55*  CREATININE 1.32*  CALCIUM 9.0     Intake/Output Summary (Last 24 hours) at 04/25/2021 0803 Last data filed at 04/25/2021 0500 Gross per 24 hour  Intake 480 ml  Output 1500 ml  Net -1020 ml          Physical Exam: Vital Signs Blood pressure (!) 143/90, pulse 96, temperature 98.9 F (37.2 C), temperature source Oral, resp. rate 16, height '5\' 10"'$  (1.778 m), weight 91.9 kg, SpO2 99 %.  General: No acute distress Mood and affect are appropriate Heart: Regular rate and rhythm no rubs murmurs or extra sounds Lungs: Clear to auscultation, breathing unlabored, no rales or wheezes Abdomen: Positive bowel sounds, soft nontender to palpation, nondistended Extremities: No clubbing, cyanosis, or edema Skin: No evidence of breakdown, no evidence of rash   MSK- Right elbow no effusion full ROM without pain  Neuro: Alert Motor: 4/5 in right and 5/5 left deltoid, bicep, tricep, grip, 4/5 B hip flexor, knee extensors, trace ankle dorsiflexor and plantar flexor (due to pain with active foot/ankle movement), unchanged  Assessment/Plan: 1. Functional deficits which require 3+ hours per day of interdisciplinary therapy in a comprehensive inpatient rehab setting. Physiatrist is providing close team supervision and 24 hour management of active medical problems listed below. Physiatrist and rehab team continue to assess barriers to discharge/monitor patient progress toward functional and medical goals  Care Tool:  Bathing    Body parts  bathed by patient: Right arm, Left arm, Chest, Abdomen, Right upper leg, Left upper leg, Face, Front perineal area, Right lower leg, Left lower leg, Buttocks   Body parts bathed by helper: Buttocks, Right lower leg, Left lower leg     Bathing assist Assist Level: Independent with assistive device (per staff)     Upper Body Dressing/Undressing Upper body dressing   What is the patient wearing?: Pull over shirt    Upper body assist Assist Level: Independent with assistive device (per staff)    Lower Body Dressing/Undressing Lower body dressing      What is the patient wearing?: Underwear/pull up, Pants     Lower body assist Assist for lower body dressing: Independent with assitive device (per staff)     Toileting Toileting    Toileting assist Assist for toileting: Independent with assistive device (per staff)     Transfers Chair/bed transfer  Transfers assist     Chair/bed transfer assist level: Independent with assistive device Chair/bed transfer assistive device: Armrests   Locomotion Ambulation   Ambulation assist   Ambulation activity did not occur: Refused  Assist level: Supervision/Verbal cueing Assistive device: Walker-rolling Max distance: 300 ft   Walk 10 feet activity   Assist  Walk 10 feet activity did not occur: Refused  Assist level: Independent with assistive device Assistive device: Walker-rolling   Walk 50 feet activity   Assist Walk 50 feet with 2 turns activity did not occur: Refused  Assist level: Independent with assistive device Assistive device: Walker-rolling    Walk 150 feet activity   Assist Walk 150 feet activity did not occur: Refused  Assist level: Supervision/Verbal cueing Assistive device: Walker-rolling    Walk 10 feet on uneven surface  activity   Assist Walk 10 feet on uneven surfaces activity did not occur: Refused   Assist level: Supervision/Verbal cueing Assistive device: Horticulturist, commercial Will patient use wheelchair at discharge?: Yes Type of Wheelchair: Manual Wheelchair activity did not occur: Refused (Pt stated he would not leave his room)  Wheelchair assist level: Independent Max wheelchair distance: 250 ft    Wheelchair 50 feet with 2 turns activity    Assist    Wheelchair 50 feet with 2 turns activity did not occur: Refused   Assist Level: Independent   Wheelchair 150 feet activity     Assist  Wheelchair 150 feet activity did not occur: Refused   Assist Level: Independent   Blood pressure (!) 143/90, pulse 96, temperature 98.9 F (37.2 C), temperature source Oral, resp. rate 16, height '5\' 10"'$  (1.778 m), weight 91.9 kg, SpO2 99 %.  Medical Problem List and Plan: 1.  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  Impaired mobility, was ambulating >200 feet: d/c Lovenox  Patient doesn't want lovenox.  3. Left shoulder/back/foot pain/Pain/diabetic peripheral neuropathy: continue Oxycodone prn. Increase steroids for gout flare. Will do 60 mg daily x 2 days, then taper. Pt rates pain as "11/10". Needs to bring in shoes so able to walk better; If steroids PO doesn't work, might need L shoulder injection. Pt hates needles.   8/1- added Lidoderm patches for Shoulder and back pain. 8pm to 8am  8/6 increase gabapentin to '300mg'$  TID, increase to 600 3 times daily on 8/13 Increase Oxy to 15 mg every 4 as needed , well controlled Discussed reducing this but pt feels like he would not be able to tolerate his foot pain.  His PCP Dr Jonelle Sidle was prescribing percocet 7.5 PTA 8/16 tenosynovitis and probable gouty arthritis, check urate, agree with ID will need steroid resumed, will restart taper at '40mg'$  and taper more slowly, ?rheum eval as OP - reduce to '30mg'$  today and monitor if joint pain increases   Clinically improved RIght elbow Elevated WBC in joint , + monosodium urate , neg orgs c/w gout 4. Mood: LCSW to follow for evaluation and support.              -antipsychotic agents: N/A 5. Neuropsych: This patient is capable of making decisions on his own behalf. 6. Skin/Wound Care: Routine pressure relief measures.  7. Fluids/Electrolytes/Nutrition: Strict I/Os 8. PJP PNA/leukocytosis:  Duration of meds discussed with pharmacy who relayed that patient has completed 21+ days of steroids and primaquine. --Clindamycin thorough 07/29 to complete 21 days. ID consulted, appreciate recs-recommended VIR evaluation.   VIR are recommending foot ultrasound, showing avascular masses versus complex fluid collections  9. Gout flare: Was on colchicine 0.6 mg daily PTA. --prednisone taper completed 8/9- colchicine dose x2 again to see if will help feet pain.  10. T2DM: Hgb A1c- 5.4. Was on glucotrol/metformin bid PTA.             --continue Lantus while on steroids with SSI for elevated BS.  CBG (last 3)  Recent Labs    04/24/21 1642 04/24/21 2135 04/25/21 0609  GLUCAP 206* 166* 131*   Fair control some lability likely related to steroids  11. Anxiety d/o: Was on Klonopin TID PTA. --bid effective at this time. 12. HTN: Monitor BP tid--continue hydralazine and Norvasc.   Vitals:   04/24/21 2040 04/25/21 0302  BP: (!) 143/91 (!) 143/90  Pulse: 99 96  Resp: 14 16  Temp: 98.3 F (36.8 C) 98.9 F (37.2 C)  SpO2: 99% 99%  Controlled 8/20  13. HIV: On Epivir and Tivicay 14. Anemia: H/H steadily improving.   Hemoglobin 8.3 on 8/16 stable  15. Hypomagnesemia: MgOx 400 mg BID  Magnesium 1.7 on 8/9 16. Leukocytosis Appreciate ID recs-ordered Rh factor and quantiferon TB testing- also GC swabs  WBCs 13.1 on 8/13, confounded by steroids at present  Blood cultures NGTD  Fevers resolved  Started on Rocephin by ID  Await further ID recs   17. Dysphagia: Resolved 18. Urinary  retention-resolved 19. Bowel incontinence- Improved 20. Chipped nails-?  Improving.  21. GERD  87/28-restarted Protonix- also has tums prn. 22. Pressure injury to buttocks: offload q53mnutes 23. Bilateral lower extremity swelling: ice, elevated, compression garments 24. Shoulder pain: continue lidocaine patch and voltaren gel.  25. Nearsighted- schedule outpatient ophthalmology eval- should be checked for diabetic retinopathy 26. Diabetic peripheral neuropathy: will benefit from outpatient Qutenza. Ordered diabetic shoes  -gabapentin increased as above  Mildly elevated on 8/13 27. Lower extremity edema:resumed '10mg'$  daily Lasix.  28. Acute exacerbation of CKD baseline BUN/ Creat about 30/1.3   Creatinine 1.65 on 8/15  IVF initiated on 8/17, repeat bmet 8/22 29.  Mild hyponatremia  Sodium 134 on 8/14, continue to monitor  LOS: 26 days [place] TO [place] 04/25/2021, 8:03 AM

## 2021-04-26 LAB — CBC WITH DIFFERENTIAL/PLATELET
Abs Immature Granulocytes: 2.13 10*3/uL — ABNORMAL HIGH (ref 0.00–0.07)
Basophils Absolute: 0.1 10*3/uL (ref 0.0–0.1)
Basophils Relative: 0 %
Eosinophils Absolute: 0 10*3/uL (ref 0.0–0.5)
Eosinophils Relative: 0 %
HCT: 27.3 % — ABNORMAL LOW (ref 39.0–52.0)
Hemoglobin: 8.4 g/dL — ABNORMAL LOW (ref 13.0–17.0)
Immature Granulocytes: 9 %
Lymphocytes Relative: 4 %
Lymphs Abs: 1.1 10*3/uL (ref 0.7–4.0)
MCH: 29.5 pg (ref 26.0–34.0)
MCHC: 30.8 g/dL (ref 30.0–36.0)
MCV: 95.8 fL (ref 80.0–100.0)
Monocytes Absolute: 2.1 10*3/uL — ABNORMAL HIGH (ref 0.1–1.0)
Monocytes Relative: 8 %
Neutro Abs: 19.8 10*3/uL — ABNORMAL HIGH (ref 1.7–7.7)
Neutrophils Relative %: 79 %
Platelets: 594 10*3/uL — ABNORMAL HIGH (ref 150–400)
RBC: 2.85 MIL/uL — ABNORMAL LOW (ref 4.22–5.81)
RDW: 18.9 % — ABNORMAL HIGH (ref 11.5–15.5)
WBC: 25.1 10*3/uL — ABNORMAL HIGH (ref 4.0–10.5)
nRBC: 0.1 % (ref 0.0–0.2)

## 2021-04-26 LAB — BASIC METABOLIC PANEL
Anion gap: 10 (ref 5–15)
BUN: 50 mg/dL — ABNORMAL HIGH (ref 6–20)
CO2: 19 mmol/L — ABNORMAL LOW (ref 22–32)
Calcium: 9.5 mg/dL (ref 8.9–10.3)
Chloride: 107 mmol/L (ref 98–111)
Creatinine, Ser: 1.35 mg/dL — ABNORMAL HIGH (ref 0.61–1.24)
GFR, Estimated: >60 mL/min (ref >60)
Glucose, Bld: 99 mg/dL (ref 70–99)
Potassium: 4.3 mmol/L (ref 3.5–5.1)
Sodium: 136 mmol/L (ref 135–145)

## 2021-04-26 LAB — BODY FLUID CULTURE W GRAM STAIN: Culture: NO GROWTH

## 2021-04-26 LAB — MAGNESIUM: Magnesium: 1.4 mg/dL — ABNORMAL LOW (ref 1.7–2.4)

## 2021-04-26 LAB — GLUCOSE, CAPILLARY
Glucose-Capillary: 109 mg/dL — ABNORMAL HIGH (ref 70–99)
Glucose-Capillary: 194 mg/dL — ABNORMAL HIGH (ref 70–99)
Glucose-Capillary: 156 mg/dL — ABNORMAL HIGH (ref 70–99)

## 2021-04-26 MED ORDER — OXYCODONE HCL 5 MG PO TABS
10.0000 mg | ORAL_TABLET | ORAL | Status: DC | PRN
  Administered 2021-04-26 – 2021-05-03 (×40): 10 mg via ORAL
  Filled 2021-04-26 (×40): qty 2

## 2021-04-26 NOTE — Progress Notes (Signed)
Physical Therapy Session Note  Patient Details  Name: Bruce Hunter MRN: [id number] Date of Birth: 06-13-1976  Today's Date: 04/26/2021 PT Individual Time: 0805-0835 PT Individual Time Calculation (min): 30 min   Short Term Goals: Week 2:  PT Short Term Goal 1 (Week 2): =LTGs d/t ELOS  Skilled Therapeutic Interventions/Progress Updates:    Pt received R sidelying in bed with lights off and blankets over his head although pt awake. Pt reports "burning" and "stabbing" pain in B feet but despite this agreeable to therapy session stating "I'll do anything that will help me get out of here."  Pt reports the ACE wraps on his feet have been on since yesterday - therapist doffed and noted pitting lines from the tightness of the ACE wraps around his ankle but lack of tightness along his feet resulting in the swelling pulling in his feet. Therapist encouraged pt wear TED hose for consistent compression at this time; however, pt declines donning them during this session requesting to go without compression for a short time. Supine>sitting L EOB mod-I. Therapist provided extensive education on the importance of performing B LE ankle and foot movements to assist with edema management as well as continue with elevation and compression - in addition to the importance of maintaining ankle ROM. Performed B LE ankle DF/PF AROM 2x20reps each LE with pt demoing extremely limited active ankle movements and unable to achieve neutral with 0 degrees of DF - educated pt on importance of improving this. Provided pt with towel to perform assisted stretch into ankle DF; however, due to pain pt unable to tolerate >~5degrees of ankle movement towards DF even when he is providing self assisted movement. Pt remained seated EOB with needs in reach at end of session to perform mod-I toileting with urinal prior to returning to supine.   Therapy Documentation Precautions:  Precautions Precautions: Fall Precaution Comments:  Hypersentive and painful feet Restrictions Weight Bearing Restrictions: No   Pain:   Unrated but states his feet "hurt real bad." Provided emotional support and distraction during session for pain management.   Therapy/Group: Individual Therapy  Tawana Scale , PT, DPT, NCS, CSRS 04/26/2021, 7:45 AM

## 2021-04-26 NOTE — Progress Notes (Signed)
PA on the floor to see pt

## 2021-04-26 NOTE — Progress Notes (Signed)
Patient with temp elevation this afternoon with tachycardia. Does not look septic and no new symptoms except for bilateral foot pain and concerns about getting oxycodone tapered. Discussed pain, neuropathy, steroid taper as well as edema management of BLE to help manage symptoms. CBC ordered for follow up. Reached out to Dr. Linus Salmons for input--->to monitor as symptoms likely due to gout and no additional work up indicated unless patient has new symptoms or T>102. Anticipate WBC to still be elevated due to presence of steroids.

## 2021-04-26 NOTE — Progress Notes (Addendum)
Notified Pam Love PA of positive MEWS-orders to follow  PA was notified after VS were obtained not at saved time of 1535

## 2021-04-27 LAB — CBC WITH DIFFERENTIAL/PLATELET
Abs Immature Granulocytes: 1.54 10*3/uL — ABNORMAL HIGH (ref 0.00–0.07)
Basophils Absolute: 0 10*3/uL (ref 0.0–0.1)
Basophils Relative: 0 %
Eosinophils Absolute: 0 10*3/uL (ref 0.0–0.5)
Eosinophils Relative: 0 %
HCT: 25.7 % — ABNORMAL LOW (ref 39.0–52.0)
Hemoglobin: 8 g/dL — ABNORMAL LOW (ref 13.0–17.0)
Immature Granulocytes: 7 %
Lymphocytes Relative: 3 %
Lymphs Abs: 0.7 10*3/uL (ref 0.7–4.0)
MCH: 29.7 pg (ref 26.0–34.0)
MCHC: 31.1 g/dL (ref 30.0–36.0)
MCV: 95.5 fL (ref 80.0–100.0)
Monocytes Absolute: 1.5 10*3/uL — ABNORMAL HIGH (ref 0.1–1.0)
Monocytes Relative: 7 %
Neutro Abs: 18.8 10*3/uL — ABNORMAL HIGH (ref 1.7–7.7)
Neutrophils Relative %: 83 %
Platelets: 569 10*3/uL — ABNORMAL HIGH (ref 150–400)
RBC: 2.69 MIL/uL — ABNORMAL LOW (ref 4.22–5.81)
RDW: 19.2 % — ABNORMAL HIGH (ref 11.5–15.5)
WBC: 22.6 10*3/uL — ABNORMAL HIGH (ref 4.0–10.5)
nRBC: 0 % (ref 0.0–0.2)

## 2021-04-27 LAB — COMPREHENSIVE METABOLIC PANEL
ALT: 15 U/L (ref 0–44)
AST: 13 U/L — ABNORMAL LOW (ref 15–41)
Albumin: 2.6 g/dL — ABNORMAL LOW (ref 3.5–5.0)
Alkaline Phosphatase: 53 U/L (ref 38–126)
Anion gap: 8 (ref 5–15)
BUN: 43 mg/dL — ABNORMAL HIGH (ref 6–20)
CO2: 22 mmol/L (ref 22–32)
Calcium: 9.4 mg/dL (ref 8.9–10.3)
Chloride: 105 mmol/L (ref 98–111)
Creatinine, Ser: 1.41 mg/dL — ABNORMAL HIGH (ref 0.61–1.24)
GFR, Estimated: >60 mL/min (ref >60)
Glucose, Bld: 160 mg/dL — ABNORMAL HIGH (ref 70–99)
Potassium: 4.8 mmol/L (ref 3.5–5.1)
Sodium: 135 mmol/L (ref 135–145)
Total Bilirubin: 0.3 mg/dL (ref 0.3–1.2)
Total Protein: 6.9 g/dL (ref 6.5–8.1)

## 2021-04-27 LAB — LIPID PANEL
Cholesterol: 127 mg/dL (ref 0–200)
HDL: 41 mg/dL (ref >40)
LDL Cholesterol: 78 mg/dL (ref 0–99)
Total CHOL/HDL Ratio: 3.1 RATIO
Triglycerides: 40 mg/dL (ref <150)
VLDL: 8 mg/dL (ref 0–40)

## 2021-04-27 LAB — GLUCOSE, CAPILLARY
Glucose-Capillary: 124 mg/dL — ABNORMAL HIGH (ref 70–99)
Glucose-Capillary: 150 mg/dL — ABNORMAL HIGH (ref 70–99)
Glucose-Capillary: 204 mg/dL — ABNORMAL HIGH (ref 70–99)
Glucose-Capillary: 137 mg/dL — ABNORMAL HIGH (ref 70–99)

## 2021-04-27 LAB — D-DIMER, QUANTITATIVE: D-Dimer, Quant: 3.15 ug/mL-FEU — ABNORMAL HIGH (ref 0.00–0.50)

## 2021-04-27 MED ORDER — ENOXAPARIN SODIUM 40 MG/0.4ML IJ SOSY
40.0000 mg | PREFILLED_SYRINGE | INTRAMUSCULAR | Status: DC
  Administered 2021-04-27 – 2021-05-02 (×6): 40 mg via SUBCUTANEOUS
  Filled 2021-04-27 (×6): qty 0.4

## 2021-04-27 NOTE — Progress Notes (Addendum)
[facility]    Date of Admission:  03/30/2021      ID: Bruce Hunter is a 45 y.o. male with CD 4 count of 60/VL 100, intermittent fevers, FUO Principal Problem:   Intensive care (ICU) myopathy Active Problems:   AIDS (acquired immune deficiency syndrome) (HCC)   Debility   Gout attack   Leukocytosis   Bilateral swelling of feet   AKI (acute kidney injury) (HCC)   SIRS (systemic inflammatory response syndrome) (HCC)   Hyponatremia   Pain in joints of both feet    Subjective: Fever, feeling poorly, still having pain to feet  Medications:   (feeding supplement) PROSource Plus  30 mL Oral TID BM   clonazePAM  0.5 mg Oral TID   dapsone  100 mg Oral Daily   diclofenac Sodium  2 g Topical QID   dolutegravir  50 mg Oral Daily   enoxaparin (LOVENOX) injection  40 mg Subcutaneous Q24H   furosemide  40 mg Oral Q M,W,F   gabapentin  600 mg Oral Q12H   hydrALAZINE  25 mg Oral Q8H   insulin aspart  0-15 Units Subcutaneous TID WC   insulin aspart  0-5 Units Subcutaneous QHS   insulin glargine-yfgn  5 Units Subcutaneous Daily   lamiVUDine  300 mg Oral Daily   lidocaine  1 patch Transdermal Q24H   lidocaine  2 patch Transdermal Q24H   lidocaine (PF)  2 mL Other Once   magnesium oxide  400 mg Oral BID   metoprolol tartrate  25 mg Oral BID   multivitamin with minerals  1 tablet Oral Daily   pantoprazole  40 mg Oral Daily   predniSONE  30 mg Oral Q breakfast   tamsulosin  0.4 mg Oral QPC supper    Objective: Vital signs in last 24 hours: Temp:  [98 F (36.7 C)-100.6 F (38.1 C)] 98.4 F (36.9 C) (08/23 1445) Pulse Rate:  [93-124] 124 (08/23 1445) Resp:  [16-18] 18 (08/23 1445) BP: (134-159)/(86-92) 144/90 (08/23 1445) SpO2:  [97 %-98 %] 98 % (08/23 1445) Weight:  [92.9 kg] 92.9 kg (08/23 0500) Physical Exam  Constitutional: He is oriented to person, place, and time. He appears well-developed and well-nourished. No distress.  HENT:   Mouth/Throat: Oropharynx is clear and moist. No oropharyngeal exudate.  Cardiovascular: Normal rate, regular rhythm and normal heart sounds. Exam reveals no gallop and no friction rub.  No murmur heard.  Pulmonary/Chest: Effort normal and breath sounds normal. No respiratory distress. He has no wheezes.  Abdominal: Soft. Bowel sounds are normal. He exhibits no distension. There is no tenderness.  Lymphadenopathy:  He has no cervical adenopathy.  Neurological: He is alert and oriented to person, place, and time.  Skin: Skin is warm and dry. No rash noted. No erythema.  Psychiatric: He has a normal mood and affect. His behavior is normal.     Lab Results Recent Labs    04/26/21 0545 04/26/21 1756 04/27/21 1226  WBC  --  25.1* 22.6*  HGB  --  8.4* 8.0*  HCT  --  27.3* 25.7*  NA 136  --  135  K 4.3  --  4.8  CL 107  --  105  CO2 19*  --  22  BUN 50*  --  43*  CREATININE 1.35*  --  1.41*   Liver Panel Recent Labs    04/27/21 1226  PROT 6.9  ALBUMIN 2.6*  AST 13*  ALT 15  ALKPHOS 53  BILITOT 0.3   Sedimentation Rate No results for input(s): ESRSEDRATE in the last 72 hours. C-Reactive Protein No results for input(s): CRP in the last 72 hours.  Microbiology: reviewed Studies/Results: No results found.   Assessment/Plan: FUO in HIV patient = will check for abd/pelvis CT with contrast to see if any LN, which would be amenable to biopsy Will also send for AFB blood cultures - looking for disseminated MAC; will also check for HHV-8.   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

## 2021-04-27 NOTE — Patient Care Conference (Signed)
Inpatient RehabilitationTeam Conference and Plan of Care Update Date: 04/27/2021   Time: 11:47 AM    Patient Name: Bruce Hunter      Medical Record Number: [id number]  Date of Birth: 06-15-1976 Sex: Male         Room/Bed: 4M09C/4M09C-01 Payor Info: Payor: [place] / Plan:  MEDICAID [facility] / Product Type: *No Product type* /    Admit Date/Time:  03/30/2021  2:52 PM  Primary Diagnosis:  Intensive care (ICU) myopathy  Hospital Problems: Principal Problem:   Intensive care (ICU) myopathy Active Problems:   AIDS (acquired immune deficiency syndrome) (HCC)   Debility   Gout attack   Leukocytosis   Bilateral swelling of feet   AKI (acute kidney injury) ([facility])   SIRS (systemic inflammatory response syndrome) (HCC)   Hyponatremia   Pain in joints of both feet    Expected Discharge Date: Expected Discharge Date:  (Awaiting medical clearance.)  Team Members Present: Physician leading conference: Dr. Courtney Heys Social Worker Present: Ovidio Kin, LCSW Nurse Present: Dorthula Nettles, RN PT Present: Ailene Rud, PT OT Present: Lillia Corporal, OT PPS Coordinator present : Ileana Ladd, PT     Current Status/Progress Goal Weekly Team Focus  Bowel/Bladder   pt cpnt of b and b lbm- 04/26/21  remain cont of ba nd b  assess q shift and prn   Swallow/Nutrition/ Hydration             ADL's   per caretool, MOD I for bathing, dressing, toileting at last assessment. Can be unsafe but very limited carryover/not receptive to [employer]  Mod I  standing if able, squat pivot transfers, safety, global conditioning   Mobility   Variable d/t medical status, up to mod I transfers and w/c  mod I chair bed transfers  transfers for safe d/c   Communication             Safety/Cognition/ Behavioral Observations            Pain   pt c/o 10/10 pain inbilateral feet  adminster medication as needed and scheduled      Skin               Discharge Planning:  Pt  plans to go home and manage does not want SNF due to will lose monthly check-awaiting medical stability for discharge   Team Discussion: Continues to have fevers, temp. 100.6 currently. Consulting Dr. Luvenia Heller for assistance. WBC 25,000, sitting HR 120-130's. Continent B/B, pain is on-going. Not sleeping due to pain. Squat pivot transfers. Not safe, refusing to try stepping or standing. Limited by pain. Has ability to stand but not willing. Patient on target to meet rehab goals: yes  *See Care Plan and progress notes for long and short-term goals.   Revisions to Treatment Plan:  Consulting ID  Teaching Needs: Family education, medication management, pain management, transfer training, gait training, balance training, endurance training, safety awareness.  Current Barriers to Discharge: Decreased caregiver support, Medical stability, Home enviroment access/layout, Lack of/limited family support, Insurance for SNF coverage, Weight, Weight bearing restrictions, Medication compliance, and Behavior  Possible Resolutions to Barriers: Continue current medications, provide emotional support.     Medical Summary Current Status: WBC is 25.1k;  Tc 100.6- continent bowel/bladder- still has bad pain in feet; skin is OK;  Barriers to Discharge: Decreased family/caregiver support;Home enviroment access/layout;Behavior  Barriers to Discharge Comments: cannot go to SNF- mother said won't take him home. has to go home alone,  because refusing to "give up his check". refuses to stand due to pain, per pt. Possible Resolutions to [employer]: focus- fever/can be mod I at times- - refusing to stand/walk due to pain; with PT AND with staff;  transfers via scoot;- will call Dr Luvenia Heller and see if there's anything to be done?   Continued Need for Acute Rehabilitation Level of Care: The patient requires daily medical management by a physician with specialized training in physical medicine and  rehabilitation for the following reasons: Medical management of patient stability for increased activity during participation in an intensive rehabilitation regime.: Yes Analysis of laboratory values and/or radiology reports with any subsequent need for medication adjustment and/or medical intervention. : Yes   I attest that I was present, lead the team conference, and concur with the assessment and plan of the team.   Cristi Loron 04/27/2021, 4:57 PM

## 2021-04-27 NOTE — Progress Notes (Signed)
Nutrition Follow-up  DOCUMENTATION CODES:   Not applicable  INTERVENTION:  Continue 30 ml ProSource Plus po TID, each supplement provides 100 kcal and 15 grams of protein.     Continue Magic cup TID with meals, each supplement provides 290 kcal and 9 grams of protein.   Continue MVI with minerals daily.   Continue nourishment snacks. RD ordered.    NUTRITION DIAGNOSIS:   Increased nutrient needs related to chronic illness (HIV) as evidenced by estimated needs; ongoing  GOAL:   Patient will meet greater than or equal to 90% of their needs; met   MONITOR:   PO intake, Supplement acceptance, Labs, Weight trends, Skin, I & O's  REASON FOR ASSESSMENT:   Malnutrition Screening Tool    ASSESSMENT:   45 year-old male with medical history of recently diagnosed HIV/AIDS and non-compliant with HAART, DM, asthma, HLD, HTN, and gout who presents with ICU myopathy.  Meal completion has been 90-100%. Pt has been tolerating his PO diet. Pt currently has nourishment snacks and Prosource plus ordered and has been consuming them. RD to continue with current orders to aid in caloric and protein needs.   Labs and medications reviewed.   Diet Order:   Diet Order             Diet Carb Modified Fluid consistency: Thin; Room service appropriate? Yes  Diet effective now                   EDUCATION NEEDS:   Education needs have been addressed  Skin:  Skin Assessment: Reviewed RN Assessment Skin Integrity Issues:: Stage II, Other (Comment) Stage II: N/A Other: N/A  Last BM:  8/23  Height:   Ht Readings from Last 1 Encounters:  03/30/21 5' 10"  (1.778 m)    Weight:   Wt Readings from Last 1 Encounters:  04/27/21 92.9 kg   BMI:  Body mass index is 29.39 kg/m.  Estimated Nutritional Needs:   Kcal:  2400-2600  Protein:  130-145 grams  Fluid:  >2.4 L  Corrin Parker, MS, RD, LDN RD pager number/after hours weekend pager number on Amion.

## 2021-04-27 NOTE — Progress Notes (Signed)
Occupational Therapy Weekly Progress Note  Patient Details  Name: Bruce Hunter MRN: [id number] Date of Birth: 03-01-1976  Beginning of progress report period: April 16, 2021 End of progress report period: April 27, 2021  Today's Date: 04/27/2021 OT Individual Time: 1100-1130 OT Individual Time Calculation (min): 30 min     The pt is at a Mod I/set up level with BADLs at a wheelchair level, continues to be impulsive at times and resistant to instructions/tips for safety as he has his "own way" of doing things. Pt's stay has been extended d/t medical complications and painful/swelling in BLEs. Pt is on a QD schedule, awaiting DC home with family/friends PRN.   Patient continues to demonstrate the following deficits: muscle weakness, decreased cardiorespiratoy endurance, decreased coordination and decreased motor planning, decreased problem solving and decreased safety awareness, and decreased sitting balance, decreased standing balance, decreased postural control, and decreased balance strategies and therefore will continue to benefit from skilled OT intervention to enhance overall performance with BADL and iADL.  Patient  has had limited progress with OT services as he is Mod I/set up with BADLs from wheelchair level. Pt's stay has been extended d/t medical complications/BLE swelling/painful feet .  Continue plan of care. Pt is QD at this time d/t extended stay and limitations.   OT Short Term Goals Week 2:  OT Short Term Goal 1 (Week 2): STGs = LTGs at Mod I Week 3:  OT Short Term Goal 1 (Week 3): STGs = LTGs at Mod I   Skilled Therapeutic Interventions/Progress Updates:    Pt greeted at time of session sitting up EOB, agreeable to OT session with pain in B feet which has been ongoing and addressed via pain meds. Squat pivot > chair with Set up of wheelchair before self propel to sink Mod I and set up for UB/LB bathing. Pt requesting curtain pulled for privacy while bathing. Note NT  entered at this time checking vitals, exited room to get new BP cuff and during this time continued ADL. Unable to finish B&D at this time during time constraints, located NT for hand off and relayed pt is Supervision for squat pivot back to bed when done.   Therapy Documentation Precautions:  Precautions Precautions: Fall Precaution Comments: Hypersentive and painful feet Restrictions Weight Bearing Restrictions: No     Therapy/Group: Individual Therapy  [name] 04/27/2021, 7:24 AM

## 2021-04-27 NOTE — Progress Notes (Signed)
Physical Therapy Session Note  Patient Details  Name: Bruce Hunter MRN: [id number] Date of Birth: [birth year]  Today's Date: 04/27/2021 PT Individual Time: 0915-0945 PT Individual Time Calculation (min): 30 min   Short Term Goals: Week 1:  PT Short Term Goal 1 (Week 1): =LTGs d/t ELOS PT Short Term Goal 1 - Progress (Week 1): Met PT Short Term Goal 2 (Week 1): Pt will tolerate x 30 minutes of activity PT Short Term Goal 2 - Progress (Week 1): Met PT Short Term Goal 3 (Week 1): Pt will tolerate sitting OOB for 30 + minutes PT Short Term Goal 3 - Progress (Week 1): Met Week 2:  PT Short Term Goal 1 (Week 2): =LTGs d/t ELOS  Skilled Therapeutic Interventions/Progress Updates:    Pt received in bed with blanket over head, actively shaking 2/2 pain but agreeable to bed level exercise. Not agreeable to donning ted hose, pt states he is going to sleep after session. Pt educated on compression for edema mangmt, not receptive. Pt positioned with legs elevated instead.   Bed level therex 2/2 pain for LE strength and edema management. Pt able to recall toe flexion extension and ankle pumps from previous session and performed with minimal cueing. Heel slides with leg lifter assist on RLE. SAQ 3 x 10 BIL with assist on RLE. Pt educated on performing exercises throughout the day. Pt remained in bed after session and was left with all needs in reach and alarm active.   Therapy Documentation Precautions:  Precautions Precautions: Fall Precaution Comments: Hypersentive and painful feet Restrictions Weight Bearing Restrictions: No    Therapy/Group: Individual Therapy  Mickel Fuchs 04/27/2021, 9:21 AM

## 2021-04-27 NOTE — Progress Notes (Signed)
Pts right PIV in anterior forearm was infiltrated and swollen at insertion site. This nurse removed IV and placed IV consult for new IV to be placed. Normal saline at 56m hr paused form 04/27/21 0615 until new placement.  No other concerns to report.

## 2021-04-27 NOTE — Progress Notes (Signed)
PROGRESS NOTE   Subjective/Complaints:  IV infiltrated per nurse and RUE was getting swollen- took out IV- called IV team to get another one.   Also, pt reports fever last night/this AM- was 100.5 Tm and Tc is 100.2  Pulse also elevated up to 100 yesterday.   Also had some stomach upset due to a lot of fruit.  Also upset I decreased pain meds to 10 mg/Oxy- but explained when he leaves, per federal law, cannot give that much Percocet at d/c.   Also c/o being cold/Shivering this AM- wrapped up in blanket.    ROS:  Pt denies SOB, abd pain, CP, N/V/C/D, and vision changes   Objective:   No results found. Recent Labs    04/26/21 1756  WBC 25.1*  HGB 8.4*  HCT 27.3*  PLT 594*     Recent Labs    04/26/21 0545  NA 136  K 4.3  CL 107  CO2 19*  GLUCOSE 99  BUN 50*  CREATININE 1.35*  CALCIUM 9.5    Intake/Output Summary (Last 24 hours) at 04/27/2021 0848 Last data filed at 04/27/2021 0346 Gross per 24 hour  Intake 828 ml  Output 3695 ml  Net -2867 ml         Physical Exam: Vital Signs Blood pressure 139/86, pulse 93, temperature 100.2 F (37.9 C), temperature source Oral, resp. rate 18, height '5\' 10"'$  (1.778 m), weight 92.9 kg, SpO2 97 %.    General: awake, shivering wrapped in blanket laying in bed; appears more pale/off color;  NAD HENT: conjugate gaze; oropharynx moist CV: regular rate but on higher side- 90s;  no JVD Pulmonary: CTA B/L; no W/R/R- good air movement- sounds good GI: soft, NT, ND, (+)BS- slightly hyperactive Psychiatric: appropriate; but c/o pain Neurological: alert Skin- dorsal pedal edema- 2-3+ with profound TTP over dorsum of feet B/L  MSK-  Has new swelling/worse of RUE- from IV- said not painful Neuro: Alert Motor: 4/5 in right and 5/5 left deltoid, bicep, tricep, grip, 4/5 B hip flexor, knee extensors, trace ankle dorsiflexor and plantar flexor (due to pain with active  foot/ankle movement), unchanged  Assessment/Plan: 1. Functional deficits which require 3+ hours per day of interdisciplinary therapy in a comprehensive inpatient rehab setting. Physiatrist is providing close team supervision and 24 hour management of active medical problems listed below. Physiatrist and rehab team continue to assess barriers to discharge/monitor patient progress toward functional and medical goals  Care Tool:  Bathing    Body parts bathed by patient: Right arm, Left arm, Chest, Abdomen, Right upper leg, Left upper leg, Face, Front perineal area, Right lower leg, Left lower leg, Buttocks   Body parts bathed by helper: Buttocks, Right lower leg, Left lower leg     Bathing assist Assist Level: Independent with assistive device (per staff)     Upper Body Dressing/Undressing Upper body dressing   What is the patient wearing?: Pull over shirt    Upper body assist Assist Level: Independent with assistive device (per staff)    Lower Body Dressing/Undressing Lower body dressing      What is the patient wearing?: Underwear/pull up, Pants     Lower body  assist Assist for lower body dressing: Independent with assitive device (per staff)     Toileting Toileting    Toileting assist Assist for toileting: Independent with assistive device (per staff)     Transfers Chair/bed transfer  Transfers assist     Chair/bed transfer assist level: Independent with assistive device Chair/bed transfer assistive device: Armrests   Locomotion Ambulation   Ambulation assist   Ambulation activity did not occur: Refused  Assist level: Supervision/Verbal cueing Assistive device: Walker-rolling Max distance: 300 ft   Walk 10 feet activity   Assist  Walk 10 feet activity did not occur: Refused  Assist level: Independent with assistive device Assistive device: Walker-rolling   Walk 50 feet activity   Assist Walk 50 feet with 2 turns activity did not occur:  Refused  Assist level: Independent with assistive device Assistive device: Walker-rolling    Walk 150 feet activity   Assist Walk 150 feet activity did not occur: Refused  Assist level: Supervision/Verbal cueing Assistive device: Walker-rolling    Walk 10 feet on uneven surface  activity   Assist Walk 10 feet on uneven surfaces activity did not occur: Refused   Assist level: Supervision/Verbal cueing Assistive device: Chemical engineer     Assist Is the patient using a wheelchair?: Yes Type of Wheelchair: Manual Wheelchair activity did not occur: Refused (Pt stated he would not leave his room)  Wheelchair assist level: Independent Max wheelchair distance: 250 ft    Wheelchair 50 feet with 2 turns activity    Assist    Wheelchair 50 feet with 2 turns activity did not occur: Refused   Assist Level: Independent   Wheelchair 150 feet activity     Assist  Wheelchair 150 feet activity did not occur: Refused   Assist Level: Independent   Blood pressure 139/86, pulse 93, temperature 100.2 F (37.9 C), temperature source Oral, resp. rate 18, height '5\' 10"'$  (1.778 m), weight 92.9 kg, SpO2 97 %.  Medical Problem List and Plan: 1.  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  Impaired mobility, was ambulating >200 feet: d/c Lovenox  Patient doesn't want lovenox.  3. Left shoulder/back/foot pain/Pain/diabetic peripheral neuropathy: continue Oxycodone prn. Increase steroids for gout flare. Will do 60 mg daily x 2 days, then taper. Pt rates pain as "11/10". Needs to bring in shoes so able to walk better; If steroids PO doesn't work, might need L shoulder injection. Pt hates needles.   8/1- added Lidoderm patches for Shoulder and back pain. 8pm to 8am  8/6 increase gabapentin to '300mg'$  TID, increase to 600 3  times daily on 8/13 Increase Oxy to 15 mg every 4 as needed , well controlled Discussed reducing this but pt feels like he would not be able to tolerate his foot pain.  His PCP Dr Jonelle Sidle was prescribing percocet 7.5 PTA 8/16 tenosynovitis and probable gouty arthritis, check urate, agree with ID will need steroid resumed, will restart taper at '40mg'$  and taper more slowly, ?rheum eval as OP - reduce to '30mg'$  today and monitor if joint pain increases  Clinically improved RIght elbow Elevated WBC in joint , + monosodium urate , neg orgs c/w gout  8/23- reduced Oxycodone since when goes home, max dose he can take is 10 mg q4 hours (not 15 mg)-  4. Mood: LCSW to follow for evaluation and support.              -  antipsychotic agents: N/A 5. Neuropsych: This patient is capable of making decisions on his own behalf. 6. Skin/Wound Care: Routine pressure relief measures.  7. Fluids/Electrolytes/Nutrition: Strict I/Os 8. PJP PNA/leukocytosis:  Duration of meds discussed with pharmacy who relayed that patient has completed 21+ days of steroids and primaquine. --Clindamycin thorough 07/29 to complete 21 days. ID consulted, appreciate recs-recommended VIR evaluation.   VIR are recommending foot ultrasound, showing avascular masses versus complex fluid collections  9. Gout flare: Was on colchicine 0.6 mg daily PTA. --prednisone taper completed 8/9- colchicine dose x2 again to see if will help feet pain.  10. T2DM: Hgb A1c- 5.4. Was on glucotrol/metformin bid PTA.             --continue Lantus while on steroids with SSI for elevated BS.  CBG (last 3)  Recent Labs    04/26/21 1620 04/26/21 2102 04/27/21 0618  GLUCAP 194* 156* 124*  Fair control some lability likely related to steroids  8/23- some lability- but overall controlled- con't regimen 11. Anxiety d/o: Was on Klonopin TID PTA. --bid effective at this time. 12. HTN: Monitor BP tid--continue hydralazine and Norvasc.   Vitals:   04/27/21 0052  04/27/21 0615  BP: 134/86 139/86  Pulse: 100 93  Resp: 16 18  Temp: 98 F (36.7 C) 100.2 F (37.9 C)  SpO2: 97% 97%  Controlled 8/20  13. HIV: On Epivir and Tivicay 14. Anemia: H/H steadily improving.   Hemoglobin 8.3 on 8/16 stable  15. Hypomagnesemia: MgOx 400 mg BID  Magnesium 1.7 on 8/9 16. Leukocytosis Appreciate ID recs-ordered Rh factor and quantiferon TB testing- also GC swabs  WBCs 13.1 on 8/13, confounded by steroids at present  Blood cultures NGTD  Fevers resolved  Started on Rocephin by ID  Await further ID recs    8/23- WBC up to 25.1k- even though prednisone dose down to 30 mg daily- have paged ID again. Also fever up to 100.5 last night.  17. Dysphagia: Resolved 18. Urinary retention-resolved 19. Bowel incontinence- Improved 20. Chipped nails-?  Improving.  21. GERD  87/28-restarted Protonix- also has tums prn. 22. Pressure injury to buttocks: offload q67mnutes 23. Bilateral lower extremity swelling: ice, elevated, compression garments 24. Shoulder pain: continue lidocaine patch and voltaren gel.  25. Nearsighted- schedule outpatient ophthalmology eval- should be checked for diabetic retinopathy 26. Diabetic peripheral neuropathy: will benefit from outpatient Qutenza. Ordered diabetic shoes  -gabapentin increased as above  Mildly elevated on 8/13 27. Lower extremity edema:resumed '10mg'$  daily Lasix.  28. Acute exacerbation of CKD baseline BUN/ Creat about 30/1.3   Creatinine 1.65 on 8/15  IVF initiated on 8/17, repeat bmet 8/22  8/23- CR down to 1.35- stop IVFs so doesn't get fluid overloaded.  29.  Mild hyponatremia  Sodium 134 on 8/14, continue to monitor   I spent a total of 37 minutes on total care- >50% on coordination of care- talking with ID and reviewing his char tin depth to see where he is on meds, etc  LOS: 28 days A FACE TO FACE EVALUATION WAS PERFORMED  Teyonna Plaisted 04/27/2021, 8:48 AM

## 2021-04-28 ENCOUNTER — Inpatient Hospital Stay (HOSPITAL_COMMUNITY): Payer: Medicaid Other

## 2021-04-28 LAB — CBC WITH DIFFERENTIAL/PLATELET
Abs Immature Granulocytes: 0 10*3/uL (ref 0.00–0.07)
Basophils Absolute: 0 10*3/uL (ref 0.0–0.1)
Basophils Relative: 0 %
Eosinophils Absolute: 0.2 10*3/uL (ref 0.0–0.5)
Eosinophils Relative: 1 %
HCT: 24.2 % — ABNORMAL LOW (ref 39.0–52.0)
Hemoglobin: 7.5 g/dL — ABNORMAL LOW (ref 13.0–17.0)
Lymphocytes Relative: 17 %
Lymphs Abs: 3.2 10*3/uL (ref 0.7–4.0)
MCH: 29.5 pg (ref 26.0–34.0)
MCHC: 31 g/dL (ref 30.0–36.0)
MCV: 95.3 fL (ref 80.0–100.0)
Monocytes Absolute: 0.8 10*3/uL (ref 0.1–1.0)
Monocytes Relative: 4 %
Neutro Abs: 14.7 10*3/uL — ABNORMAL HIGH (ref 1.7–7.7)
Neutrophils Relative %: 78 %
Platelets: 523 10*3/uL — ABNORMAL HIGH (ref 150–400)
RBC: 2.54 MIL/uL — ABNORMAL LOW (ref 4.22–5.81)
RDW: 19.2 % — ABNORMAL HIGH (ref 11.5–15.5)
WBC: 18.8 10*3/uL — ABNORMAL HIGH (ref 4.0–10.5)
nRBC: 0 /100 WBC
nRBC: 0.1 % (ref 0.0–0.2)

## 2021-04-28 LAB — BASIC METABOLIC PANEL
Anion gap: 8 (ref 5–15)
BUN: 39 mg/dL — ABNORMAL HIGH (ref 6–20)
CO2: 22 mmol/L (ref 22–32)
Calcium: 9.1 mg/dL (ref 8.9–10.3)
Chloride: 105 mmol/L (ref 98–111)
Creatinine, Ser: 1.31 mg/dL — ABNORMAL HIGH (ref 0.61–1.24)
GFR, Estimated: >60 mL/min (ref >60)
Glucose, Bld: 166 mg/dL — ABNORMAL HIGH (ref 70–99)
Potassium: 4.1 mmol/L (ref 3.5–5.1)
Sodium: 135 mmol/L (ref 135–145)

## 2021-04-28 LAB — GLUCOSE, CAPILLARY
Glucose-Capillary: 150 mg/dL — ABNORMAL HIGH (ref 70–99)
Glucose-Capillary: 136 mg/dL — ABNORMAL HIGH (ref 70–99)
Glucose-Capillary: 202 mg/dL — ABNORMAL HIGH (ref 70–99)
Glucose-Capillary: 149 mg/dL — ABNORMAL HIGH (ref 70–99)

## 2021-04-28 IMAGING — CT CT ANGIO CHEST
2 of 8 series · 16 of 46 positions shown · IV contrast (omnipaque)
Comparison: CT abdomen pelvis dated [DATE] and chest radiograph
dated [DATE].

CLINICAL DATA: Fever of unknown origin.

EXAM:
CT ANGIOGRAPHY CHEST
CT ABDOMEN AND PELVIS WITH CONTRAST
TECHNIQUE: Multidetector CT imaging of the chest was performed using the
standard protocol during bolus administration of intravenous
contrast. Multiplanar CT image reconstructions and MIPs were
obtained to evaluate the vascular anatomy. Multidetector CT imaging
of the abdomen and pelvis was performed using the standard protocol
during bolus administration of intravenous contrast.
CONTRAST:  80mL OMNIPAQUE IOHEXOL 350 MG/ML SOLN

[Series 7: thins · axial · 0.79mm/px · z∈[-829,-556]mm · 13 of 443 slices shown]
[im 27/443  lung]
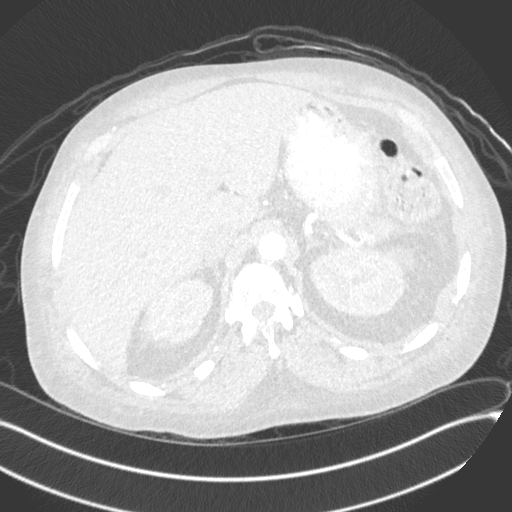
[im 53/443  soft-tissue]
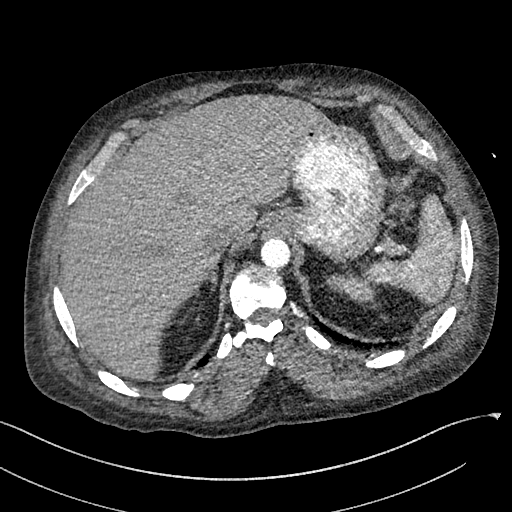
[im 105/443  lung]
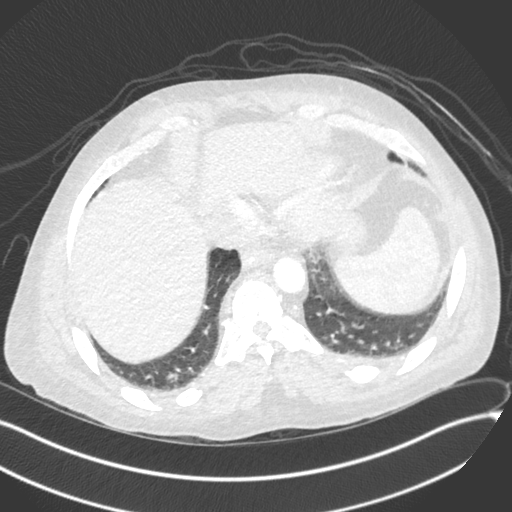
[im 131/443  soft-tissue]
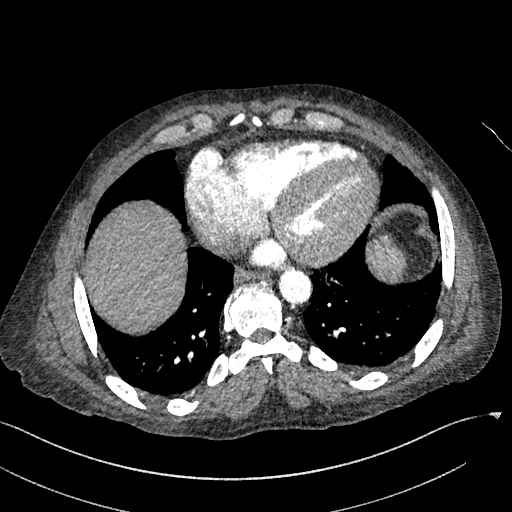
[im 157/443  lung]
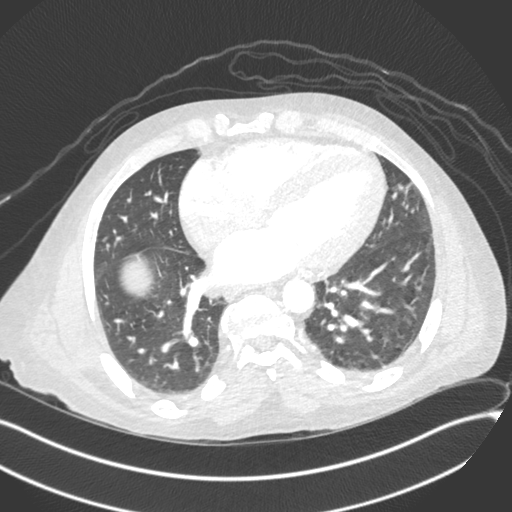
[im 183/443  soft-tissue]
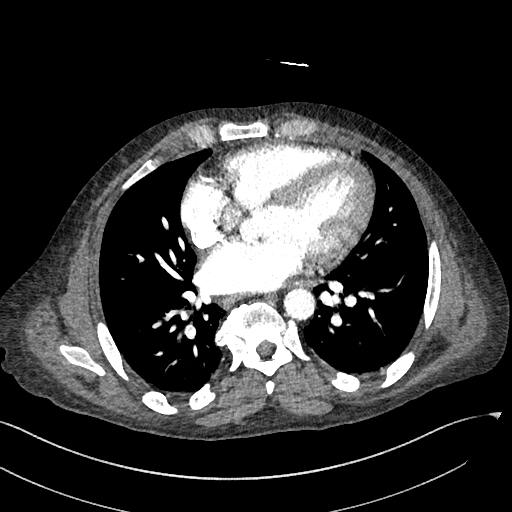
[im 235/443  lung]
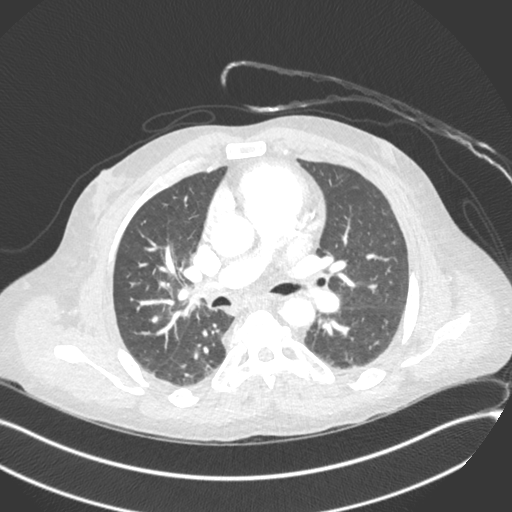
[im 261/443  soft-tissue]
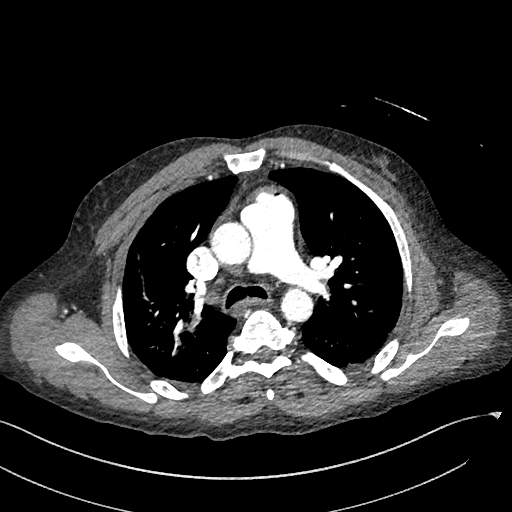
[im 287/443  lung]
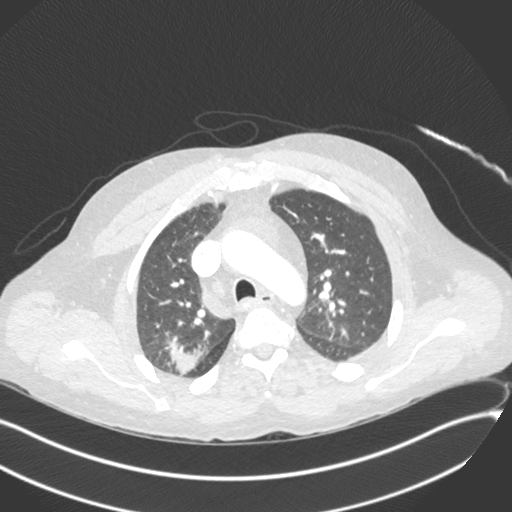
[im 313/443  soft-tissue]
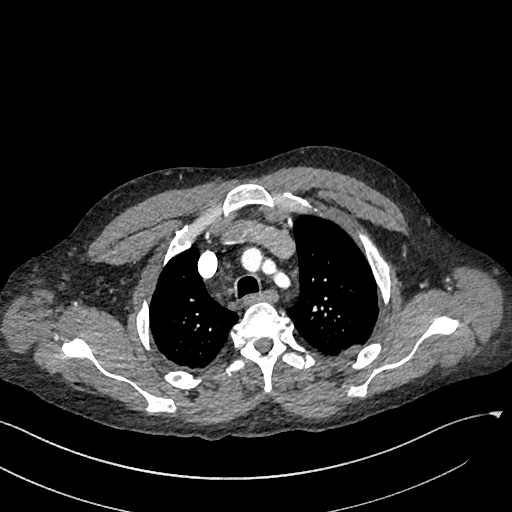
[im 339/443  lung]
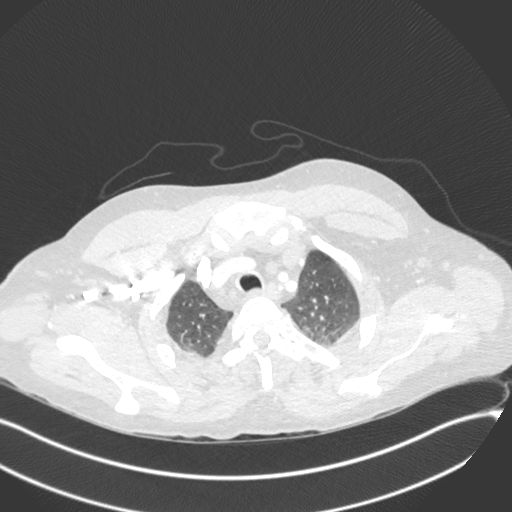
[im 391/443  soft-tissue]
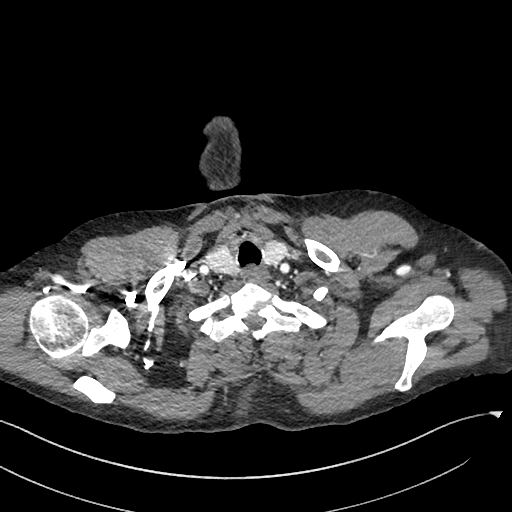
[im 417/443  lung]
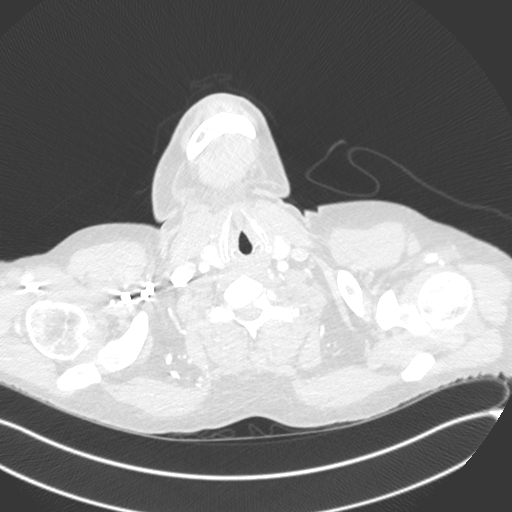

[Series 8: cor · coronal · 0.64mm/px · 3 of 111 slices shown]
[im 28/111  soft-tissue]
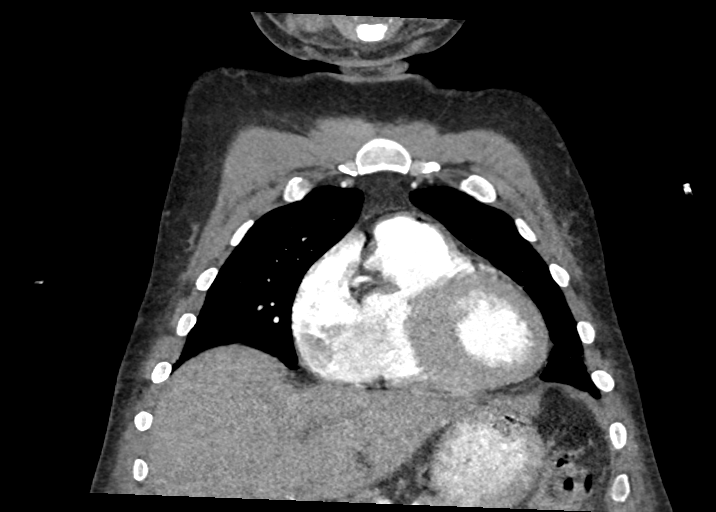
[im 56/111  soft-tissue]
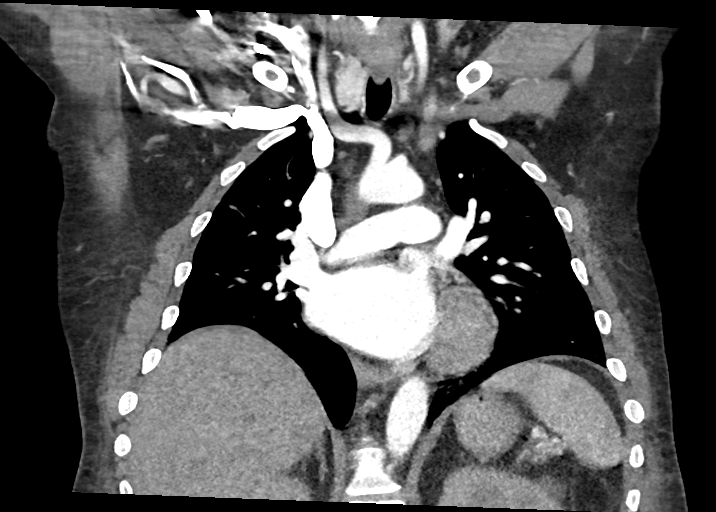
[im 83/111  soft-tissue]
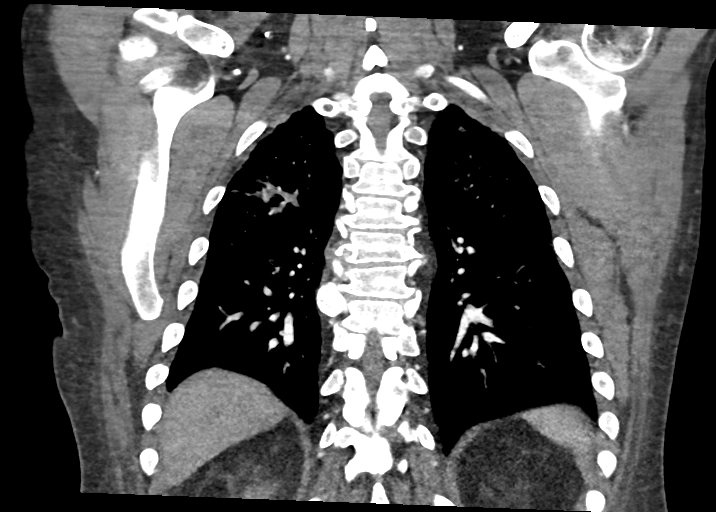

[16 of 46 positions shown; findings below may reference images not displayed]

FINDINGS: CTA CHEST FINDINGS

Cardiovascular: Mild cardiomegaly. No pericardial effusion. Thoracic
aorta is unremarkable. Origins of the great vessels of the aortic
arch appear patent. Evaluation of the pulmonary arteries is limited
due to respiratory motion artifact. No pulmonary artery embolus
identified.

Mediastinum/Nodes: No hilar or mediastinal adenopathy. Esophagus is
grossly unremarkable. No mediastinal fluid collection.

Lungs/Pleura: There is a streaky area of airspace density with
somewhat patchy and nodular component extending from the right hilum
into the right upper lobe which may represent atelectasis. Atypical
infection is not excluded. Clinical correlation and follow-up
recommended. Minimal bibasilar dependent atelectasis. No pleural
effusion or pneumothorax. The central airways are patent.

Musculoskeletal: Degenerative changes of the spine. Mild diffuse
subcutaneous edema. No fluid collection. No acute osseous pathology.

Review of the MIP images confirms the above findings.

CT ABDOMEN and PELVIS FINDINGS

Intra-abdominal free air or free fluid.

Hepatobiliary: The liver is unremarkable. No intrahepatic biliary
ductal dilatation. Small gallstone. No pericholecystic fluid or
evidence of acute cholecystitis by CT.

Pancreas: Unremarkable. No pancreatic ductal dilatation or
surrounding inflammatory changes.

Spleen: Normal in size without focal abnormality.

Adrenals/Urinary Tract: The adrenal glands unremarkable. There is no
hydronephrosis on either side. There is symmetric enhancement and
excretion of contrast by both kidneys. The visualized ureters and
urinary bladder appear unremarkable.

Stomach/Bowel: There is moderate stool throughout the colon. There
is colonic diverticulosis. Mild pericolonic haziness adjacent to the
ascending colon may be chronic or represent mild acute
diverticulitis. Clinical correlation is recommended. No diverticular
abscess or perforation. There is no bowel obstruction or active
inflammation. The appendix is normal.

Vascular/Lymphatic: Mild aortoiliac atherosclerotic disease. IVC is
unremarkable. No pain venous gas. There is no adenopathy.

Reproductive: The prostate and seminal vesicles are grossly
unremarkable. No pelvic mass.

Other: Is diffuse subcutaneous edema.  No fluid collection.

Musculoskeletal: Degenerative changes of the spine. No acute osseous
pathology.

Review of the MIP images confirms the above findings.
IMPRESSION: 1. No CT evidence of pulmonary embolism.
2. Streaky area of airspace density extending from the right hilum
into the right upper lobe may represent atelectasis. Atypical
infection is not excluded. Clinical correlation and follow-up
recommended.
3. Colonic diverticulosis. Mild pericolonic haziness adjacent to the
ascending colon may be chronic or represent mild acute
diverticulitis. Clinical correlation is recommended. No diverticular
abscess or perforation.
4. Cholelithiasis.
5. Aortic Atherosclerosis ([XT]-[XT]).

## 2021-04-28 IMAGING — CT CT ABD-PELV W/ CM
2 of 4 series · 15 of 46 positions shown, 17 images · IV contrast (APPLIED)
Comparison: CT abdomen pelvis dated [DATE] and chest radiograph
dated [DATE].

CLINICAL DATA: Fever of unknown origin.

EXAM:
CT ANGIOGRAPHY CHEST
CT ABDOMEN AND PELVIS WITH CONTRAST
TECHNIQUE: Multidetector CT imaging of the chest was performed using the
standard protocol during bolus administration of intravenous
contrast. Multiplanar CT image reconstructions and MIPs were
obtained to evaluate the vascular anatomy. Multidetector CT imaging
of the abdomen and pelvis was performed using the standard protocol
during bolus administration of intravenous contrast.
CONTRAST:  80mL OMNIPAQUE IOHEXOL 350 MG/ML SOLN

[Series 3: abdomen 5.0 · axial · 0.90mm/px · z∈[-1190,-740]mm · 12 of 102 slices shown, 14 images]
[im 6/102  soft-tissue]
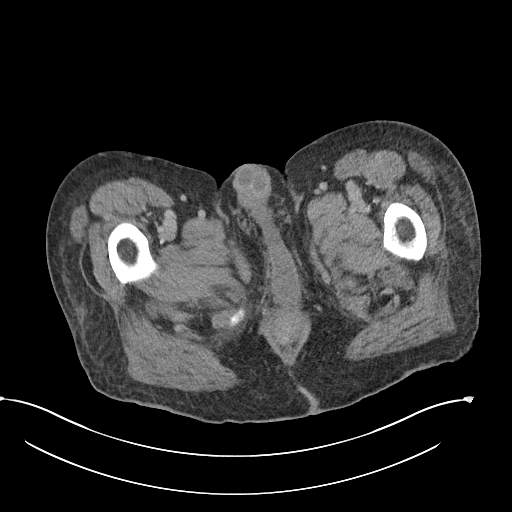
[im 6/102  bone]
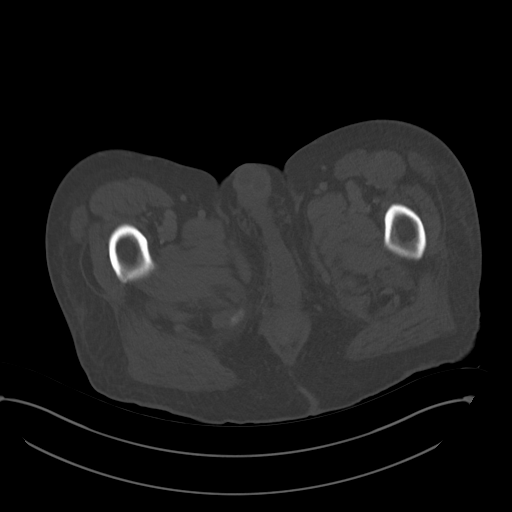
[im 16/102  soft-tissue]
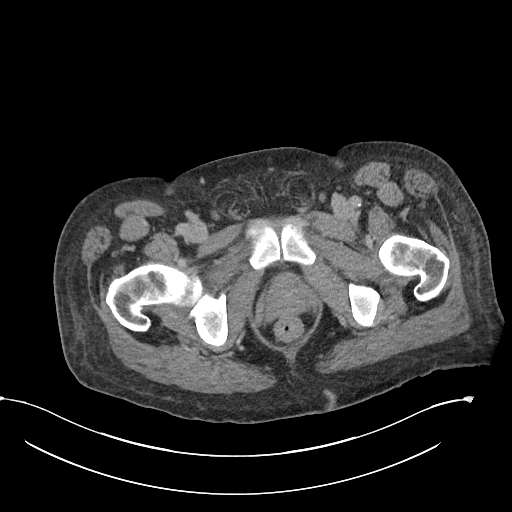
[im 22/102  soft-tissue]
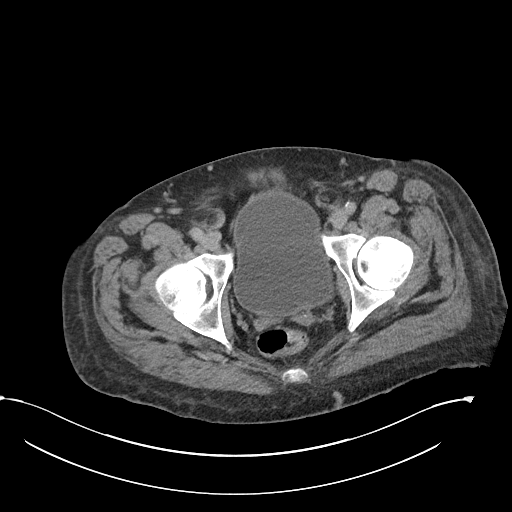
[im 32/102  soft-tissue]
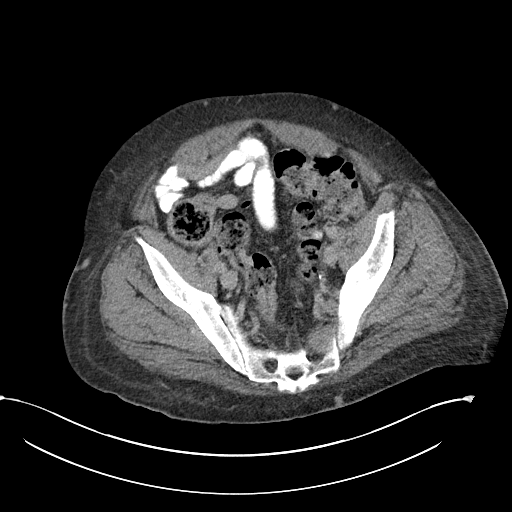
[im 38/102  soft-tissue]
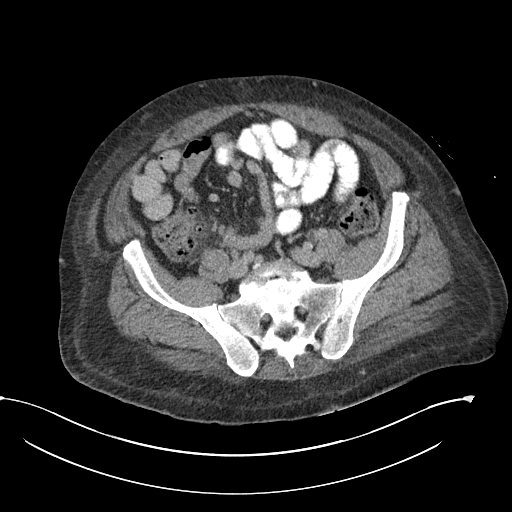
[im 48/102  soft-tissue]
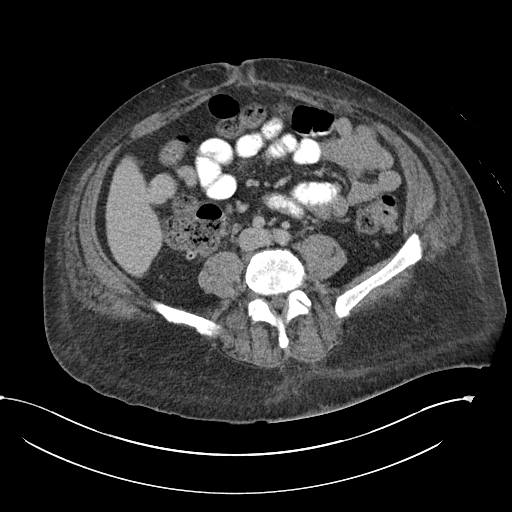
[im 54/102  soft-tissue]
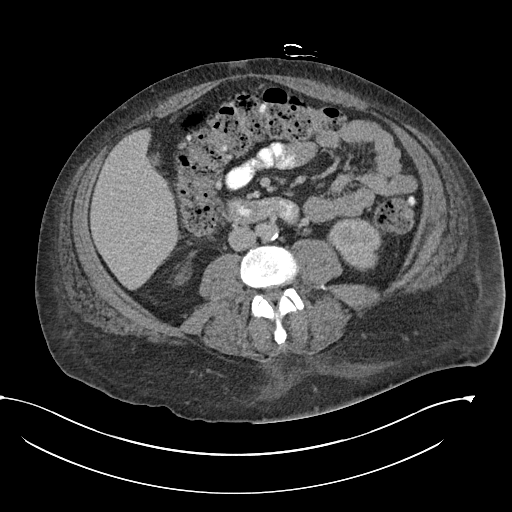
[im 64/102  soft-tissue]
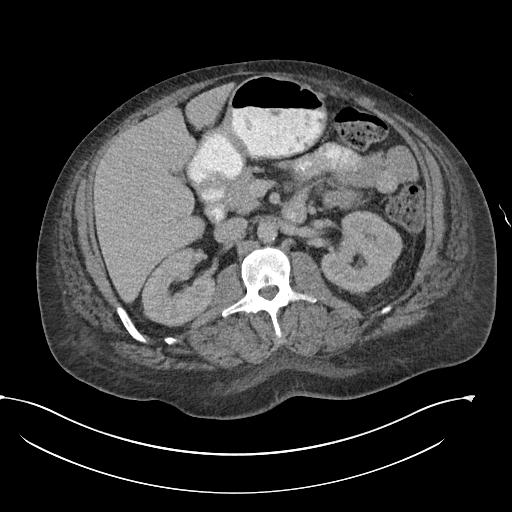
[im 70/102  soft-tissue]
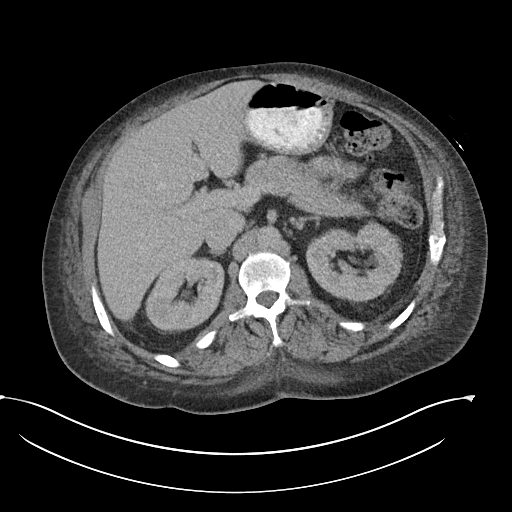
[im 70/102  bone]
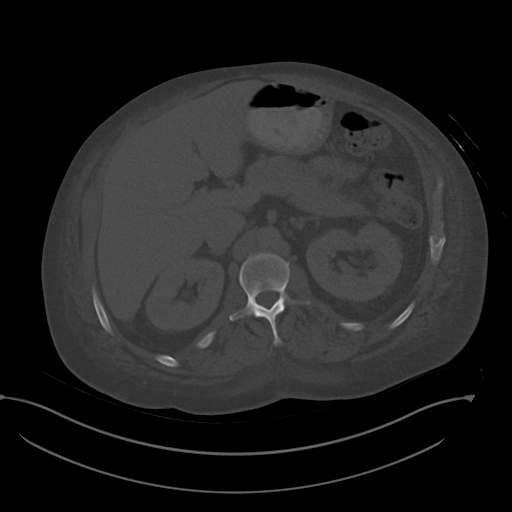
[im 80/102  soft-tissue]
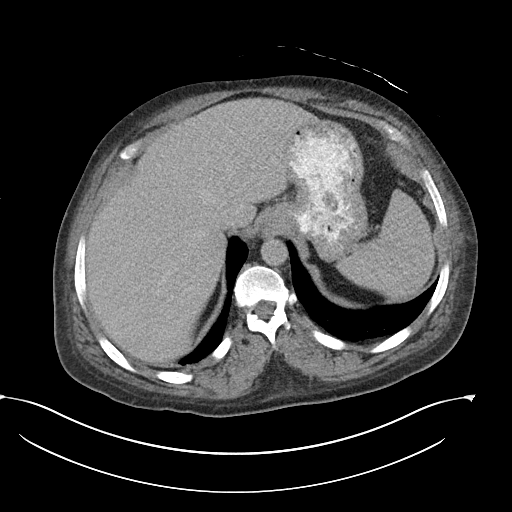
[im 86/102  soft-tissue]
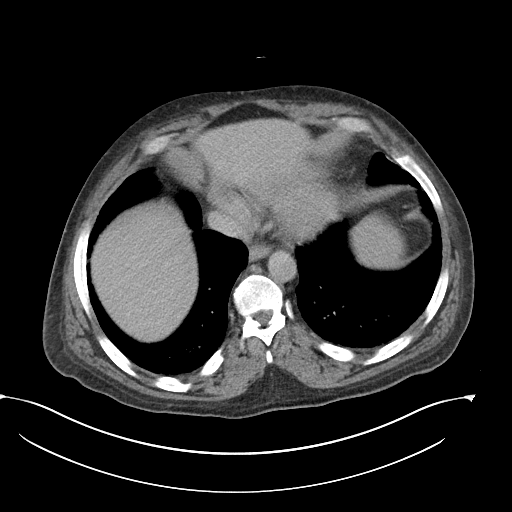
[im 96/102  soft-tissue]
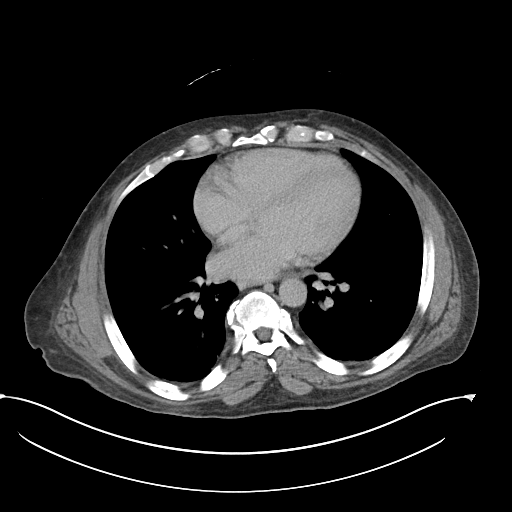

[Series 6: abdomen 3.0 mpr cor · coronal · 0.84mm/px · 3 of 96 slices shown]
[im 32/96  soft-tissue]
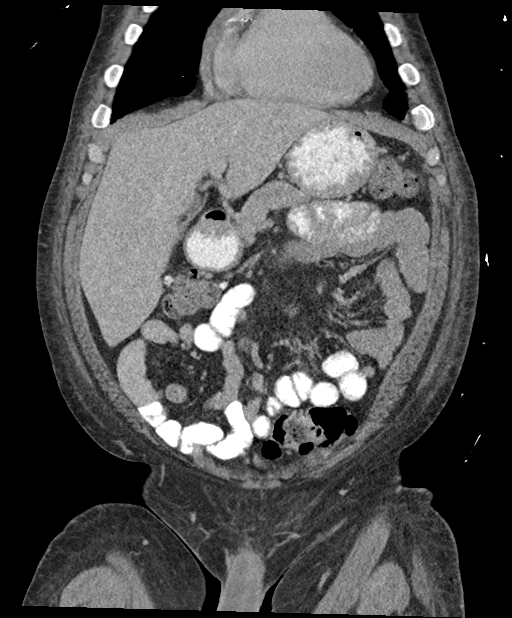
[im 43/96  soft-tissue]
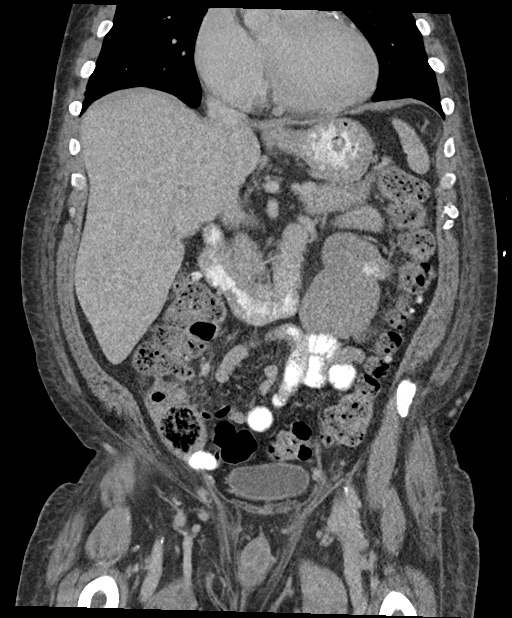
[im 53/96  soft-tissue]
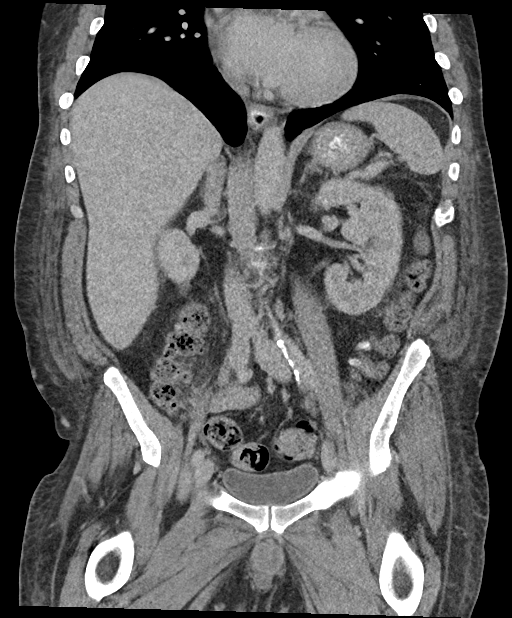

[15 of 46 positions shown; findings below may reference images not displayed]

FINDINGS: CTA CHEST FINDINGS

Cardiovascular: Mild cardiomegaly. No pericardial effusion. Thoracic
aorta is unremarkable. Origins of the great vessels of the aortic
arch appear patent. Evaluation of the pulmonary arteries is limited
due to respiratory motion artifact. No pulmonary artery embolus
identified.

Mediastinum/Nodes: No hilar or mediastinal adenopathy. Esophagus is
grossly unremarkable. No mediastinal fluid collection.

Lungs/Pleura: There is a streaky area of airspace density with
somewhat patchy and nodular component extending from the right hilum
into the right upper lobe which may represent atelectasis. Atypical
infection is not excluded. Clinical correlation and follow-up
recommended. Minimal bibasilar dependent atelectasis. No pleural
effusion or pneumothorax. The central airways are patent.

Musculoskeletal: Degenerative changes of the spine. Mild diffuse
subcutaneous edema. No fluid collection. No acute osseous pathology.

Review of the MIP images confirms the above findings.

CT ABDOMEN and PELVIS FINDINGS

Intra-abdominal free air or free fluid.

Hepatobiliary: The liver is unremarkable. No intrahepatic biliary
ductal dilatation. Small gallstone. No pericholecystic fluid or
evidence of acute cholecystitis by CT.

Pancreas: Unremarkable. No pancreatic ductal dilatation or
surrounding inflammatory changes.

Spleen: Normal in size without focal abnormality.

Adrenals/Urinary Tract: The adrenal glands unremarkable. There is no
hydronephrosis on either side. There is symmetric enhancement and
excretion of contrast by both kidneys. The visualized ureters and
urinary bladder appear unremarkable.

Stomach/Bowel: There is moderate stool throughout the colon. There
is colonic diverticulosis. Mild pericolonic haziness adjacent to the
ascending colon may be chronic or represent mild acute
diverticulitis. Clinical correlation is recommended. No diverticular
abscess or perforation. There is no bowel obstruction or active
inflammation. The appendix is normal.

Vascular/Lymphatic: Mild aortoiliac atherosclerotic disease. IVC is
unremarkable. No pain venous gas. There is no adenopathy.

Reproductive: The prostate and seminal vesicles are grossly
unremarkable. No pelvic mass.

Other: Is diffuse subcutaneous edema.  No fluid collection.

Musculoskeletal: Degenerative changes of the spine. No acute osseous
pathology.

Review of the MIP images confirms the above findings.
IMPRESSION: 1. No CT evidence of pulmonary embolism.
2. Streaky area of airspace density extending from the right hilum
into the right upper lobe may represent atelectasis. Atypical
infection is not excluded. Clinical correlation and follow-up
recommended.
3. Colonic diverticulosis. Mild pericolonic haziness adjacent to the
ascending colon may be chronic or represent mild acute
diverticulitis. Clinical correlation is recommended. No diverticular
abscess or perforation.
4. Cholelithiasis.
5. Aortic Atherosclerosis ([XT]-[XT]).

## 2021-04-28 MED ORDER — IOHEXOL 350 MG/ML SOLN
80.0000 mL | Freq: Once | INTRAVENOUS | Status: AC | PRN
  Administered 2021-04-28: 80 mL via INTRAVENOUS

## 2021-04-28 MED ORDER — MORPHINE SULFATE ER 15 MG PO TBCR
15.0000 mg | EXTENDED_RELEASE_TABLET | Freq: Two times a day (BID) | ORAL | Status: DC
  Administered 2021-04-28 – 2021-05-03 (×11): 15 mg via ORAL
  Filled 2021-04-28 (×11): qty 1

## 2021-04-28 NOTE — Progress Notes (Signed)
Occupational Therapy Session Note  Patient Details  Name: Bruce Hunter MRN: [id number] Date of Birth: Oct 02, 1975  Today's Date: 04/28/2021 OT Individual Time: 1100-1128 OT Individual Time Calculation (min): 28 min    Short Term Goals: Week 2:  OT Short Term Goal 1 (Week 2): STGs = LTGs at Mod I Week 3:  OT Short Term Goal 1 (Week 3): STGs = LTGs at Mod I   Skilled Therapeutic Interventions/Progress Updates:    Pt greeted at time of session sitting EOB with mom visiting, mother having questions regarding going home and relayed with pt in agreement that he did not want SNF d/t financial reasons. Pain in B feet which has been ongoing. Focus of session on UB/LB dressing with Set up, pt able to do lateral leans and rolling R/L bed level as unable to stand. Squat pivot bed > chair Set Up assist for chair with pt able to manage parts. Alarm belt on, call bell in reach. Pt declining leg rests at this time.   Therapy Documentation Precautions:  Precautions Precautions: Fall Precaution Comments: Hypersentive and painful feet Restrictions Weight Bearing Restrictions: No     Therapy/Group: Individual Therapy  [name] 04/28/2021, 7:24 AM

## 2021-04-28 NOTE — Progress Notes (Signed)
PROGRESS NOTE   Subjective/Complaints:  Pt reports he feels very overwhelmed- and doesn't know what to do- he wants "to speak to someone" but Neuropsych is out of town.  He isn't sure he wants Psychiatry.   R knee also painful/new- pain stabbing him.  2-3 days.  Got tearful about mental health.    ROS:   Pt denies SOB, abd pain, CP, N/V/C/D, and vision changes    Objective:   CT Angio Chest Pulmonary Embolism (PE) W or WO Contrast  Result Date: 04/28/2021 CLINICAL DATA:  Fever of unknown origin. EXAM: CT ANGIOGRAPHY CHEST CT ABDOMEN AND PELVIS WITH CONTRAST TECHNIQUE: Multidetector CT imaging of the chest was performed using the standard protocol during bolus administration of intravenous contrast. Multiplanar CT image reconstructions and MIPs were obtained to evaluate the vascular anatomy. Multidetector CT imaging of the abdomen and pelvis was performed using the standard protocol during bolus administration of intravenous contrast. CONTRAST:  16m OMNIPAQUE IOHEXOL 350 MG/ML SOLN COMPARISON:  CT abdomen pelvis dated 03/13/2021 and chest radiograph dated 04/18/2021. FINDINGS: CTA CHEST FINDINGS Cardiovascular: Mild cardiomegaly. No pericardial effusion. Thoracic aorta is unremarkable. Origins of the great vessels of the aortic arch appear patent. Evaluation of the pulmonary arteries is limited due to respiratory motion artifact. No pulmonary artery embolus identified. Mediastinum/Nodes: No hilar or mediastinal adenopathy. Esophagus is grossly unremarkable. No mediastinal fluid collection. Lungs/Pleura: There is a streaky area of airspace density with somewhat patchy and nodular component extending from the right hilum into the right upper lobe which may represent atelectasis. Atypical infection is not excluded. Clinical correlation and follow-up recommended. Minimal bibasilar dependent atelectasis. No pleural effusion or pneumothorax.  The central airways are patent. Musculoskeletal: Degenerative changes of the spine. Mild diffuse subcutaneous edema. No fluid collection. No acute osseous pathology. Review of the MIP images confirms the above findings. CT ABDOMEN and PELVIS FINDINGS Intra-abdominal free air or free fluid. Hepatobiliary: The liver is unremarkable. No intrahepatic biliary ductal dilatation. Small gallstone. No pericholecystic fluid or evidence of acute cholecystitis by CT. Pancreas: Unremarkable. No pancreatic ductal dilatation or surrounding inflammatory changes. Spleen: Normal in size without focal abnormality. Adrenals/Urinary Tract: The adrenal glands unremarkable. There is no hydronephrosis on either side. There is symmetric enhancement and excretion of contrast by both kidneys. The visualized ureters and urinary bladder appear unremarkable. Stomach/Bowel: There is moderate stool throughout the colon. There is colonic diverticulosis. Mild pericolonic haziness adjacent to the ascending colon may be chronic or represent mild acute diverticulitis. Clinical correlation is recommended. No diverticular abscess or perforation. There is no bowel obstruction or active inflammation. The appendix is normal. Vascular/Lymphatic: Mild aortoiliac atherosclerotic disease. IVC is unremarkable. No pain venous gas. There is no adenopathy. Reproductive: The prostate and seminal vesicles are grossly unremarkable. No pelvic mass. Other: Is diffuse subcutaneous edema.  No fluid collection. Musculoskeletal: Degenerative changes of the spine. No acute osseous pathology. Review of the MIP images confirms the above findings. IMPRESSION: 1. No CT evidence of pulmonary embolism. 2. Streaky area of airspace density extending from the right hilum into the right upper lobe may represent atelectasis. Atypical infection is not excluded. Clinical correlation and follow-up recommended. 3. Colonic diverticulosis. Mild pericolonic  haziness adjacent to the ascending  colon may be chronic or represent mild acute diverticulitis. Clinical correlation is recommended. No diverticular abscess or perforation. 4. Cholelithiasis. 5. Aortic Atherosclerosis (ICD10-I70.0). Electronically Signed   By: Anner Crete M.D.   On: 04/28/2021 02:42   CT ABDOMEN PELVIS W CONTRAST  Result Date: 04/28/2021 CLINICAL DATA:  Fever of unknown origin. EXAM: CT ANGIOGRAPHY CHEST CT ABDOMEN AND PELVIS WITH CONTRAST TECHNIQUE: Multidetector CT imaging of the chest was performed using the standard protocol during bolus administration of intravenous contrast. Multiplanar CT image reconstructions and MIPs were obtained to evaluate the vascular anatomy. Multidetector CT imaging of the abdomen and pelvis was performed using the standard protocol during bolus administration of intravenous contrast. CONTRAST:  15m OMNIPAQUE IOHEXOL 350 MG/ML SOLN COMPARISON:  CT abdomen pelvis dated 03/13/2021 and chest radiograph dated 04/18/2021. FINDINGS: CTA CHEST FINDINGS Cardiovascular: Mild cardiomegaly. No pericardial effusion. Thoracic aorta is unremarkable. Origins of the great vessels of the aortic arch appear patent. Evaluation of the pulmonary arteries is limited due to respiratory motion artifact. No pulmonary artery embolus identified. Mediastinum/Nodes: No hilar or mediastinal adenopathy. Esophagus is grossly unremarkable. No mediastinal fluid collection. Lungs/Pleura: There is a streaky area of airspace density with somewhat patchy and nodular component extending from the right hilum into the right upper lobe which may represent atelectasis. Atypical infection is not excluded. Clinical correlation and follow-up recommended. Minimal bibasilar dependent atelectasis. No pleural effusion or pneumothorax. The central airways are patent. Musculoskeletal: Degenerative changes of the spine. Mild diffuse subcutaneous edema. No fluid collection. No acute osseous pathology. Review of the MIP images confirms the above  findings. CT ABDOMEN and PELVIS FINDINGS Intra-abdominal free air or free fluid. Hepatobiliary: The liver is unremarkable. No intrahepatic biliary ductal dilatation. Small gallstone. No pericholecystic fluid or evidence of acute cholecystitis by CT. Pancreas: Unremarkable. No pancreatic ductal dilatation or surrounding inflammatory changes. Spleen: Normal in size without focal abnormality. Adrenals/Urinary Tract: The adrenal glands unremarkable. There is no hydronephrosis on either side. There is symmetric enhancement and excretion of contrast by both kidneys. The visualized ureters and urinary bladder appear unremarkable. Stomach/Bowel: There is moderate stool throughout the colon. There is colonic diverticulosis. Mild pericolonic haziness adjacent to the ascending colon may be chronic or represent mild acute diverticulitis. Clinical correlation is recommended. No diverticular abscess or perforation. There is no bowel obstruction or active inflammation. The appendix is normal. Vascular/Lymphatic: Mild aortoiliac atherosclerotic disease. IVC is unremarkable. No pain venous gas. There is no adenopathy. Reproductive: The prostate and seminal vesicles are grossly unremarkable. No pelvic mass. Other: Is diffuse subcutaneous edema.  No fluid collection. Musculoskeletal: Degenerative changes of the spine. No acute osseous pathology. Review of the MIP images confirms the above findings. IMPRESSION: 1. No CT evidence of pulmonary embolism. 2. Streaky area of airspace density extending from the right hilum into the right upper lobe may represent atelectasis. Atypical infection is not excluded. Clinical correlation and follow-up recommended. 3. Colonic diverticulosis. Mild pericolonic haziness adjacent to the ascending colon may be chronic or represent mild acute diverticulitis. Clinical correlation is recommended. No diverticular abscess or perforation. 4. Cholelithiasis. 5. Aortic Atherosclerosis (ICD10-I70.0).  Electronically Signed   By: AAnner CreteM.D.   On: 04/28/2021 02:42   Recent Labs    04/27/21 1226 04/28/21 0544  WBC 22.6* 18.8*  HGB 8.0* 7.5*  HCT 25.7* 24.2*  PLT 569* 523*     Recent Labs    04/27/21 1226 04/28/21 0544  NA 135 135  K 4.8  4.1  CL 105 105  CO2 22 22  GLUCOSE 160* 166*  BUN 43* 39*  CREATININE 1.41* 1.31*  CALCIUM 9.4 9.1    Intake/Output Summary (Last 24 hours) at 04/28/2021 1926 Last data filed at 04/28/2021 1851 Gross per 24 hour  Intake 708 ml  Output 2250 ml  Net -1542 ml         Physical Exam: Vital Signs Blood pressure (!) 145/91, pulse 91, temperature 100 F (37.8 C), temperature source Oral, resp. rate 18, height '5\' 10"'$  (1.778 m), weight 92.9 kg, SpO2 98 %.     General: awake, alert, appropriate, laying in bed; moved to side of bed OK; better color; NAD HENT: conjugate gaze; oropharynx moist CV: regular rate; no JVD Pulmonary: CTA B/L; no W/R/R- good air movement GI: soft, NT, ND, (+)BS Psychiatric: appropriate, but tearful/crying about feeling overwhelmed/tired.  Neurological: Ox3  Neurological: alert Skin- dorsal pedal edema- 2-3+ dorsal pedal edema.  Has new swelling/worse of RUE- from IV- said not painful Neuro: Alert Motor: 4/5 in right and 5/5 left deltoid, bicep, tricep, grip, 4/5 B hip flexor, knee extensors, trace ankle dorsiflexor and plantar flexor (due to pain with active foot/ankle movement), unchanged  Assessment/Plan: 1. Functional deficits which require 3+ hours per day of interdisciplinary therapy in a comprehensive inpatient rehab setting. Physiatrist is providing close team supervision and 24 hour management of active medical problems listed below. Physiatrist and rehab team continue to assess barriers to discharge/monitor patient progress toward functional and medical goals  Care Tool:  Bathing    Body parts bathed by patient: Face   Body parts bathed by helper: Buttocks, Right lower leg, Left  lower leg     Bathing assist Assist Level: Independent with assistive device (per staff)     Upper Body Dressing/Undressing Upper body dressing   What is the patient wearing?: Pull over shirt    Upper body assist Assist Level: Independent with assistive device (per staff)    Lower Body Dressing/Undressing Lower body dressing      What is the patient wearing?: Underwear/pull up, Pants     Lower body assist Assist for lower body dressing: Independent with assitive device (per staff)     Toileting Toileting    Toileting assist Assist for toileting: Independent with assistive device (per staff)     Transfers Chair/bed transfer  Transfers assist     Chair/bed transfer assist level: Independent with assistive device Chair/bed transfer assistive device: Armrests   Locomotion Ambulation   Ambulation assist   Ambulation activity did not occur: Refused  Assist level: Supervision/Verbal cueing Assistive device: Walker-rolling Max distance: 300 ft   Walk 10 feet activity   Assist  Walk 10 feet activity did not occur: Refused  Assist level: Independent with assistive device Assistive device: Walker-rolling   Walk 50 feet activity   Assist Walk 50 feet with 2 turns activity did not occur: Refused  Assist level: Independent with assistive device Assistive device: Walker-rolling    Walk 150 feet activity   Assist Walk 150 feet activity did not occur: Refused  Assist level: Supervision/Verbal cueing Assistive device: Walker-rolling    Walk 10 feet on uneven surface  activity   Assist Walk 10 feet on uneven surfaces activity did not occur: Refused   Assist level: Supervision/Verbal cueing Assistive device: Chemical engineer     Assist Is the patient using a wheelchair?: Yes Type of Wheelchair: Manual Wheelchair activity did not occur: Refused (Pt stated he would not leave  his room)  Wheelchair assist level: Independent Max  wheelchair distance: 250 ft    Wheelchair 50 feet with 2 turns activity    Assist    Wheelchair 50 feet with 2 turns activity did not occur: Refused   Assist Level: Independent   Wheelchair 150 feet activity     Assist  Wheelchair 150 feet activity did not occur: Refused   Assist Level: Independent   Blood pressure (!) 145/91, pulse 91, temperature 100 F (37.8 C), temperature source Oral, resp. rate 18, height '5\' 10"'$  (1.778 m), weight 92.9 kg, SpO2 98 %.  Medical Problem List and Plan: 1.  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  2.  Impaired mobility, was ambulating >200 feet: d/c Lovenox  Patient doesn't want lovenox. 8/24- restarted with concern of elevated WBC- doesn't have PE/D Dimer elevated- will con't.   3. Left shoulder/back/foot pain/Pain/diabetic peripheral neuropathy: continue Oxycodone prn. Increase steroids for gout flare. Will do 60 mg daily x 2 days, then taper. Pt rates pain as "11/10". Needs to bring in shoes so able to walk better; If steroids PO doesn't work, might need L shoulder injection. Pt hates needles.   8/1- added Lidoderm patches for Shoulder and back pain. 8pm to 8am  8/6 increase gabapentin to '300mg'$  TID, increase to 600 3 times daily on 8/13 Increase Oxy to 15 mg every 4 as needed , well controlled Discussed reducing this but pt feels like he would not be able to tolerate his foot pain.  His PCP Dr Jonelle Sidle was prescribing percocet 7.5 PTA 8/16 tenosynovitis and probable gouty arthritis, check urate, agree with ID will need steroid resumed, will restart taper at '40mg'$  and taper more slowly, ?rheum eval as OP - reduce to '30mg'$  today and monitor if joint pain increases  Clinically improved RIght elbow Elevated WBC in joint , + monosodium urate , neg orgs c/w gout  8/23- reduced Oxycodone since  when goes home, max dose he can take is 10 mg q4 hours (not 15 mg)- 8/24- will start MS contin 15 mg BID since can do that with federal law, but explained PCP might not cover?  4. Mood: LCSW to follow for evaluation and support.              -antipsychotic agents: N/A 5. Neuropsych: This patient is capable of making decisions on his own behalf. 6. Skin/Wound Care: Routine pressure relief measures.  7. Fluids/Electrolytes/Nutrition: Strict I/Os 8. PJP PNA/leukocytosis:  Duration of meds discussed with pharmacy who relayed that patient has completed 21+ days of steroids and primaquine. --Clindamycin thorough 07/29 to complete 21 days. ID consulted, appreciate recs-recommended VIR evaluation.   VIR are recommending foot ultrasound, showing avascular masses versus complex fluid collections  9. Gout flare: Was on colchicine 0.6 mg daily PTA. --prednisone taper completed 8/9- colchicine dose x2 again to see if will help feet pain.  8/24- on tapering prednisone- now likely chronic gout.  10. T2DM: Hgb A1c- 5.4. Was on glucotrol/metformin bid PTA.             --continue Lantus while on steroids with SSI for elevated BS.  CBG (last 3)  Recent Labs    04/28/21 0548 04/28/21 1134 04/28/21 1653  GLUCAP 150* 136* 202*  Fair control some lability likely related to steroids  8/24- BG's look great- con't  regimen 11. Anxiety d/o: Was on Klonopin TID PTA. --bid effective at this time. 12. HTN: Monitor BP tid--continue hydralazine and Norvasc.   Vitals:   04/28/21 1030 04/28/21 1401  BP: (!) 144/99 (!) 145/91  Pulse: 94 91  Resp: 18 18  Temp: 99.1 F (37.3 C) 100 F (37.8 C)  SpO2: 97% 98%  Controlled 8/20  13. HIV: On Epivir and Tivicay 14. Anemia: H/H steadily improving.   Hemoglobin 8.3 on 8/16 stable  15. Hypomagnesemia: MgOx 400 mg BID  Magnesium 1.7 on 8/9 16. Leukocytosis Appreciate ID recs-ordered Rh factor and quantiferon TB testing- also GC swabs  WBCs 13.1 on 8/13, confounded  by steroids at present  Blood cultures NGTD  Fevers resolved  Started on Rocephin by ID  Await further ID recs    8/23- WBC up to 25.1k- even though prednisone dose down to 30 mg daily- have paged ID again. Also fever up to 100.5 last night.   8/24- WBC down to 18k- ID did FULL workup and cannot find anything- fevers have resolved- and pt looks much better- f/u with ID after d/c.  17. Dysphagia: Resolved 18. Urinary retention-resolved 19. Bowel incontinence- Improved 20. Chipped nails-?  Improving.  21. GERD  87/28-restarted Protonix- also has tums prn. 22. Pressure injury to buttocks: offload q85mnutes 23. Bilateral lower extremity swelling: ice, elevated, compression garments 24. Shoulder pain: continue lidocaine patch and voltaren gel.  25. Nearsighted- schedule outpatient ophthalmology eval- should be checked for diabetic retinopathy 26. Diabetic peripheral neuropathy: will benefit from outpatient Qutenza. Ordered diabetic shoes  -gabapentin increased as above  Mildly elevated on 8/13 27. Lower extremity edema:resumed '10mg'$  daily Lasix.  28. Acute exacerbation of CKD baseline BUN/ Creat about 30/1.3   Creatinine 1.65 on 8/15  IVF initiated on 8/17, repeat bmet 8/22  8/23- CR down to 1.35- stop IVFs so doesn't get fluid overloaded.   8/24- Cr 1.31- con't to monitor 29.  Mild hyponatremia  Sodium 134 on 8/14, continue to monitor    [place]: [place], 7:26 PM

## 2021-04-28 NOTE — Progress Notes (Signed)
Physical Therapy Weekly Progress Note  Patient Details  Name: Bruce Hunter MRN: [id number] Date of Birth: October 21, 1975  Beginning of progress report period: April 16, 2021 End of progress report period: April 28, 2021    Patient has met 1 of 1 short term goals.  Pt has met long term goals, is able to transfer mod I and manage w/c mod I when not limited by pain. Awaiting medical stability for d/c.  Patient continues to demonstrate the following deficits muscle weakness, decreased safety awareness and decreased memory, and decreased balance strategies and therefore will continue to benefit from skilled PT intervention to increase functional independence with mobility.  Patient progressing toward long term goals..  Continue plan of care.  PT Short Term Goals Week 1:  PT Short Term Goal 1 (Week 1): =LTGs d/t ELOS PT Short Term Goal 1 - Progress (Week 1): Met PT Short Term Goal 2 (Week 1): Pt will tolerate x 30 minutes of activity PT Short Term Goal 2 - Progress (Week 1): Met PT Short Term Goal 3 (Week 1): Pt will tolerate sitting OOB for 30 + minutes PT Short Term Goal 3 - Progress (Week 1): Met Week 2:  PT Short Term Goal 1 (Week 2): =LTGs d/t ELOS    Therapy Documentation Precautions:  Precautions Precautions: Fall Precaution Comments: Hypersentive and painful feet Restrictions Weight Bearing Restrictions: No General:   Vital Signs: Therapy Vitals Temp: 100 F (37.8 C) Temp Source: Oral Pulse Rate: 91 Resp: 18 BP: (!) 145/91 Patient Position (if appropriate): Lying Oxygen Therapy SpO2: 98 % O2 Device: Room Air Pain: Pain Assessment Pain Score: 3  Faces Pain Scale: No hurt PAINAD (Pain Assessment in Advanced Dementia) Breathing: normal Negative Vocalization: none Facial Expression: smiling or inexpressive Body Language: relaxed    Therapy/Group: Individual Therapy  Mickel Fuchs 04/28/2021, 5:08 PM

## 2021-04-28 NOTE — Progress Notes (Signed)
[facility]    Date of Admission:  03/30/2021      ID: Bruce Hunter is a 45 y.o. male with  hiv disease, FUO Principal Problem:   Intensive care (ICU) myopathy Active Problems:   AIDS (acquired immune deficiency syndrome) ([place])   Debility   Gout attack   Leukocytosis   Bilateral swelling of feet   AKI (acute kidney injury) (HCC)   SIRS (systemic inflammatory response syndrome) (HCC)   Hyponatremia   Pain in joints of both feet    Subjective: Afebrile this morning, feeling better this morning but still having painful feet  Leukocytosis improved, underwent chest CTA, Ab/Pelvis that did not find any LN or pathology for cause of fever;ruled out PE as well.  Medications:   (feeding supplement) PROSource Plus  30 mL Oral TID BM   clonazePAM  0.5 mg Oral TID   dapsone  100 mg Oral Daily   diclofenac Sodium  2 g Topical QID   dolutegravir  50 mg Oral Daily   enoxaparin (LOVENOX) injection  40 mg Subcutaneous Q24H   furosemide  40 mg Oral Q M,W,F   gabapentin  600 mg Oral Q12H   hydrALAZINE  25 mg Oral Q8H   insulin aspart  0-15 Units Subcutaneous TID WC   insulin aspart  0-5 Units Subcutaneous QHS   insulin glargine-yfgn  5 Units Subcutaneous Daily   lamiVUDine  300 mg Oral Daily   lidocaine  1 patch Transdermal Q24H   lidocaine  2 patch Transdermal Q24H   lidocaine (PF)  2 mL Other Once   magnesium oxide  400 mg Oral BID   metoprolol tartrate  25 mg Oral BID   morphine  15 mg Oral Q12H   multivitamin with minerals  1 tablet Oral Daily   pantoprazole  40 mg Oral Daily   predniSONE  30 mg Oral Q breakfast   tamsulosin  0.4 mg Oral QPC supper    Objective: Vital signs in last 24 hours: Temp:  [98.1 F (36.7 C)-100.6 F (38.1 C)] 98.1 F (36.7 C) (08/24 0552) Pulse Rate:  [94-124] 94 (08/24 1030) Resp:  [16-18] 18 (08/24 1030) BP: (124-159)/(71-99) 144/99 (08/24 1030) SpO2:  [97 %-100 %] 97 % (08/24 1030) Weight:  [92.9 kg] 92.9 kg  (08/23 1454)    Lab Results Recent Labs    04/27/21 1226 04/28/21 0544  WBC 22.6* 18.8*  HGB 8.0* 7.5*  HCT 25.7* 24.2*  NA 135 135  K 4.8 4.1  CL 105 105  CO2 22 22  BUN 43* 39*  CREATININE 1.41* 1.31*   Liver Panel Recent Labs    04/27/21 1226  PROT 6.9  ALBUMIN 2.6*  AST 13*  ALT 15  ALKPHOS 60  BILITOT 0.3    Microbiology: AFB blood cx pending [facility]-8 Studies/Results: CT Angio Chest Pulmonary Embolism (PE) W or WO Contrast  Result Date: 04/28/2021 CLINICAL DATA:  Fever of unknown origin. EXAM: CT ANGIOGRAPHY CHEST CT ABDOMEN AND PELVIS WITH CONTRAST TECHNIQUE: Multidetector CT imaging of the chest was performed using the standard protocol during bolus administration of intravenous contrast. Multiplanar CT image reconstructions and MIPs were obtained to evaluate the vascular anatomy. Multidetector CT imaging of the abdomen and pelvis was performed using the standard protocol during bolus administration of intravenous contrast. CONTRAST:  36m OMNIPAQUE IOHEXOL 350 MG/ML SOLN COMPARISON:  CT abdomen pelvis dated 03/13/2021 and chest radiograph dated 04/18/2021. FINDINGS: CTA CHEST FINDINGS Cardiovascular: Mild cardiomegaly. No pericardial  effusion. Thoracic aorta is unremarkable. Origins of the great vessels of the aortic arch appear patent. Evaluation of the pulmonary arteries is limited due to respiratory motion artifact. No pulmonary artery embolus identified. Mediastinum/Nodes: No hilar or mediastinal adenopathy. Esophagus is grossly unremarkable. No mediastinal fluid collection. Lungs/Pleura: There is a streaky area of airspace density with somewhat patchy and nodular component extending from the right hilum into the right upper lobe which may represent atelectasis. Atypical infection is not excluded. Clinical correlation and follow-up recommended. Minimal bibasilar dependent atelectasis. No pleural effusion or pneumothorax. The central airways are patent. Musculoskeletal:  Degenerative changes of the spine. Mild diffuse subcutaneous edema. No fluid collection. No acute osseous pathology. Review of the MIP images confirms the above findings. CT ABDOMEN and PELVIS FINDINGS Intra-abdominal free air or free fluid. Hepatobiliary: The liver is unremarkable. No intrahepatic biliary ductal dilatation. Small gallstone. No pericholecystic fluid or evidence of acute cholecystitis by CT. Pancreas: Unremarkable. No pancreatic ductal dilatation or surrounding inflammatory changes. Spleen: Normal in size without focal abnormality. Adrenals/Urinary Tract: The adrenal glands unremarkable. There is no hydronephrosis on either side. There is symmetric enhancement and excretion of contrast by both kidneys. The visualized ureters and urinary bladder appear unremarkable. Stomach/Bowel: There is moderate stool throughout the colon. There is colonic diverticulosis. Mild pericolonic haziness adjacent to the ascending colon may be chronic or represent mild acute diverticulitis. Clinical correlation is recommended. No diverticular abscess or perforation. There is no bowel obstruction or active inflammation. The appendix is normal. Vascular/Lymphatic: Mild aortoiliac atherosclerotic disease. IVC is unremarkable. No pain venous gas. There is no adenopathy. Reproductive: The prostate and seminal vesicles are grossly unremarkable. No pelvic mass. Other: Is diffuse subcutaneous edema.  No fluid collection. Musculoskeletal: Degenerative changes of the spine. No acute osseous pathology. Review of the MIP images confirms the above findings. IMPRESSION: 1. No CT evidence of pulmonary embolism. 2. Streaky area of airspace density extending from the right hilum into the right upper lobe may represent atelectasis. Atypical infection is not excluded. Clinical correlation and follow-up recommended. 3. Colonic diverticulosis. Mild pericolonic haziness adjacent to the ascending colon may be chronic or represent mild acute  diverticulitis. Clinical correlation is recommended. No diverticular abscess or perforation. 4. Cholelithiasis. 5. Aortic Atherosclerosis (ICD10-I70.0). Electronically Signed   By: Anner Crete M.D.   On: 04/28/2021 02:42   CT ABDOMEN PELVIS W CONTRAST  Result Date: 04/28/2021 CLINICAL DATA:  Fever of unknown origin. EXAM: CT ANGIOGRAPHY CHEST CT ABDOMEN AND PELVIS WITH CONTRAST TECHNIQUE: Multidetector CT imaging of the chest was performed using the standard protocol during bolus administration of intravenous contrast. Multiplanar CT image reconstructions and MIPs were obtained to evaluate the vascular anatomy. Multidetector CT imaging of the abdomen and pelvis was performed using the standard protocol during bolus administration of intravenous contrast. CONTRAST:  22m OMNIPAQUE IOHEXOL 350 MG/ML SOLN COMPARISON:  CT abdomen pelvis dated 03/13/2021 and chest radiograph dated 04/18/2021. FINDINGS: CTA CHEST FINDINGS Cardiovascular: Mild cardiomegaly. No pericardial effusion. Thoracic aorta is unremarkable. Origins of the great vessels of the aortic arch appear patent. Evaluation of the pulmonary arteries is limited due to respiratory motion artifact. No pulmonary artery embolus identified. Mediastinum/Nodes: No hilar or mediastinal adenopathy. Esophagus is grossly unremarkable. No mediastinal fluid collection. Lungs/Pleura: There is a streaky area of airspace density with somewhat patchy and nodular component extending from the right hilum into the right upper lobe which may represent atelectasis. Atypical infection is not excluded. Clinical correlation and follow-up recommended. Minimal bibasilar dependent atelectasis. No  pleural effusion or pneumothorax. The central airways are patent. Musculoskeletal: Degenerative changes of the spine. Mild diffuse subcutaneous edema. No fluid collection. No acute osseous pathology. Review of the MIP images confirms the above findings. CT ABDOMEN and PELVIS FINDINGS  Intra-abdominal free air or free fluid. Hepatobiliary: The liver is unremarkable. No intrahepatic biliary ductal dilatation. Small gallstone. No pericholecystic fluid or evidence of acute cholecystitis by CT. Pancreas: Unremarkable. No pancreatic ductal dilatation or surrounding inflammatory changes. Spleen: Normal in size without focal abnormality. Adrenals/Urinary Tract: The adrenal glands unremarkable. There is no hydronephrosis on either side. There is symmetric enhancement and excretion of contrast by both kidneys. The visualized ureters and urinary bladder appear unremarkable. Stomach/Bowel: There is moderate stool throughout the colon. There is colonic diverticulosis. Mild pericolonic haziness adjacent to the ascending colon may be chronic or represent mild acute diverticulitis. Clinical correlation is recommended. No diverticular abscess or perforation. There is no bowel obstruction or active inflammation. The appendix is normal. Vascular/Lymphatic: Mild aortoiliac atherosclerotic disease. IVC is unremarkable. No pain venous gas. There is no adenopathy. Reproductive: The prostate and seminal vesicles are grossly unremarkable. No pelvic mass. Other: Is diffuse subcutaneous edema.  No fluid collection. Musculoskeletal: Degenerative changes of the spine. No acute osseous pathology. Review of the MIP images confirms the above findings. IMPRESSION: 1. No CT evidence of pulmonary embolism. 2. Streaky area of airspace density extending from the right hilum into the right upper lobe may represent atelectasis. Atypical infection is not excluded. Clinical correlation and follow-up recommended. 3. Colonic diverticulosis. Mild pericolonic haziness adjacent to the ascending colon may be chronic or represent mild acute diverticulitis. Clinical correlation is recommended. No diverticular abscess or perforation. 4. Cholelithiasis. 5. Aortic Atherosclerosis (ICD10-I70.0). Electronically Signed   By: Anner Crete M.D.    On: 04/28/2021 02:42     Assessment/Plan: Intermittent fevers = will follow cultures but suspect this maybe due to immune reconstitution. Infectious work up thus far has not found cause of fevers. Will continue to monitor. It does not appear to be drug fever.  Bilateral feet pain = consider TID dosing of gabapentin to see if that would help since kidney function improving  LE edema = continue on compression socks  Hiv disease = continue on tivicay and lamivudine  Oi proph = continue on dapsone  [facility] Cell: [phone] Pager: [phone]  04/28/2021, 10:51 AM

## 2021-04-28 NOTE — Progress Notes (Signed)
Physical Therapy Session Note  Patient Details  Name: VENTURA HOLLENBECK MRN: [id number] Date of Birth: 1976-06-04  Today's Date: 04/28/2021 PT Individual Time: 0850-0935 PT Individual Time Calculation (min): 45 min   Short Term Goals: Week 2:  PT Short Term Goal 1 (Week 2): =LTGs d/t ELOS  Skilled Therapeutic Interventions/Progress Updates: Pt presented in bed agreeable to therapy. Pt states pain 9/10, received pain meds from nsg during session. Pt agreeable to participate in seated and supine therex. Pt refused to wear any compression on BLE for edema at start of session. Performed supine to sit at EOB mod I and pt performed ankle pumps within limited range x 10 then AA DF x 10 to assist with edema management. Performed LAQ 2 x 10 bilaterally then returned to sidelying mod I and performed hip abd 2 x 10 bilaterally followed by SAQ 2 x 15 bilaterally and SLR 2 x 10 bilaterally. PTA performed gentle massage to B feet to encourage lymphatic drainage and after education pt agreeable to wear TED hose until next session. Pt was able to don TED hose mod I with adaptive technique and increased time. Pt left in bed at end of session with bed alarm on, call bell within reach and needs met.      Therapy Documentation Precautions:  Precautions Precautions: Fall Precaution Comments: Hypersentive and painful feet Restrictions Weight Bearing Restrictions: No General:   Vital Signs: Therapy Vitals Temp: 100 F (37.8 C) Temp Source: Oral Pulse Rate: 91 Resp: 18 BP: (!) 145/91 Patient Position (if appropriate): Lying Oxygen Therapy SpO2: 98 % O2 Device: Room Air    Therapy/Group: Individual Therapy  Chakita Mcgraw Kingslee Mairena, PTA  04/28/2021, 4:06 PM

## 2021-04-29 LAB — GLUCOSE, CAPILLARY
Glucose-Capillary: 135 mg/dL — ABNORMAL HIGH (ref 70–99)
Glucose-Capillary: 100 mg/dL — ABNORMAL HIGH (ref 70–99)
Glucose-Capillary: 158 mg/dL — ABNORMAL HIGH (ref 70–99)
Glucose-Capillary: 138 mg/dL — ABNORMAL HIGH (ref 70–99)

## 2021-04-29 LAB — MISC LABCORP TEST (SEND OUT)

## 2021-04-29 MED ORDER — GABAPENTIN 300 MG PO CAPS
600.0000 mg | ORAL_CAPSULE | Freq: Three times a day (TID) | ORAL | Status: DC
  Administered 2021-04-29 – 2021-05-03 (×13): 600 mg via ORAL
  Filled 2021-04-29 (×13): qty 2

## 2021-04-29 NOTE — Progress Notes (Signed)
Occupational Therapy Session Note  Patient Details  Name: Bruce Hunter MRN: [id number] Date of Birth: 15-Oct-1975  Today's Date: 04/30/2021 OT Individual Time: 1450-1530 OT Individual Time Calculation (min): 40 min    Skilled Therapeutic Interventions/Progress Updates:    Pt greeted in bed, premedicated for pain and requesting to participate in 2 IADL activities. He declined attempting to stand today, stating that he didn't want to overdo it and that he had a great deal of pain this morning. Supervision for squat pivot<w/c. To work on [employer] and endurance, pt self propelled his w/c to the laundry room. Pt able to unload clothing into washer and adjust settings. He then self propelled to the atrium to order some food at [employer]. Pt visibly exerting himself, stating "this is a lot of exercise." Continued education regarding his diabetic diet and importance of adherence. Pt stated that earlier today he ate a salad with his mother and had oil+vinegar dressing. After ordering and receiving his food, pt self propelled back to the room. He remained sitting up, all needs within reach, watching TV with his mother.   Note that when pt self propelled the w/c he either used his feet for propulsion or kept feet elevated to work on LB strengthening  Therapy Documentation Precautions:  Precautions Precautions: Fall Precaution Comments: Hypersentive and painful feet Restrictions Weight Bearing Restrictions: No  Vital Signs: Therapy Vitals Temp: 98.8 F (37.1 C) Pulse Rate: (!) 108 Resp: 17 BP: (!) 144/84 Patient Position (if appropriate): Sitting Oxygen Therapy SpO2: 96 % O2 Device: Room Air Pain: Pain Assessment Pain Scale: 0-10 Pain Score: 2  Pain Type: Acute pain Pain Location: Foot Pain Orientation: Right;Left Pain Descriptors / Indicators: Aching Pain Frequency: Constant Pain Onset: On-going Pain Intervention(s): Medication (See eMAR) ADL: ADL Eating: Independent (per  staff) Grooming: Modified independent (per staff) Where Assessed-Grooming: Sitting at sink Upper Body Bathing: Modified independent (per staff) Where Assessed-Upper Body Bathing: Sitting at sink Lower Body Bathing: Modified independent (per staff) Where Assessed-Lower Body Bathing: Sitting at sink, Standing at sink Upper Body Dressing: Modified independent (Device) (per staff) Where Assessed-Upper Body Dressing: Standing at sink Lower Body Dressing: Modified independent (per staff) Where Assessed-Lower Body Dressing: Sitting at sink, Standing at sink Toileting: Modified independent (per staff) Toilet Transfer: Modified independent (per staff) Toilet Transfer Method: Squat pivot Tub/Shower Transfer: Close supervison Social research officer, government: Not assessed ADL Comments: Using clinical judgement, pt can perform BADLs from bedlevel at Mod I level     Therapy/Group: Individual Therapy  Milferd Ansell A Gissela Bloch 04/30/2021, 4:20 PM

## 2021-04-29 NOTE — Progress Notes (Signed)
Occupational Therapy Session Note  Patient Details  Name: Bruce Hunter MRN: [id number] Date of Birth: [birth year]  Today's Date: 04/29/2021 OT Individual Time: 1545-1630 OT Individual Time Calculation (min): 45 min    Short Term Goals: Week 2:  OT Short Term Goal 1 (Week 2): STGs = LTGs at Mod I Week 3:  OT Short Term Goal 1 (Week 3): STGs = LTGs at Mod I   Skilled Therapeutic Interventions/Progress Updates:    Pt greeted at time of session sitting on BSC with NT present, hand off to OT. Pt insistent on performing stand pivot transfer to wheelchair, did so with Min/Mod A and cues for hand/foot placement, safety, fully turning, etc. Pt wanting to do ADL, self propel to sink and UB/LB bathing with Set up from wheelchair level with compensatory techniques. UB dress set up and LB dress for underwear and pants at sit > stand level with Min A for fully over hips but note normally able to do with set up w/ lateral leans. Min A stand pivot wheelchair > bed with RW. Alarm on call bell in reach.   Therapy Documentation Precautions:  Precautions Precautions: Fall Precaution Comments: Hypersentive and painful feet Restrictions Weight Bearing Restrictions: No    Therapy/Group: Individual Therapy  [name] 04/29/2021, 4:11 PM

## 2021-04-29 NOTE — Progress Notes (Signed)
PROGRESS NOTE   Subjective/Complaints:  Pt reports able to sleep "a little more" with MS Contin and worked a little better, but said didn't help "more than that".  Also hasn't gotten pain meds yet this AM.  Also noted his brother going to give his dog away.  Went over with pt- we really need to discharge him by Monday- since not making gains, not walking, and at a stand still- the ID workup is negative.    ROS:   Pt denies SOB, abd pain, CP, N/V/C/D, and vision changes     Objective:   CT Angio Chest Pulmonary Embolism (PE) W or WO Contrast  Result Date: 04/28/2021 CLINICAL DATA:  Fever of unknown origin. EXAM: CT ANGIOGRAPHY CHEST CT ABDOMEN AND PELVIS WITH CONTRAST TECHNIQUE: Multidetector CT imaging of the chest was performed using the standard protocol during bolus administration of intravenous contrast. Multiplanar CT image reconstructions and MIPs were obtained to evaluate the vascular anatomy. Multidetector CT imaging of the abdomen and pelvis was performed using the standard protocol during bolus administration of intravenous contrast. CONTRAST:  66m OMNIPAQUE IOHEXOL 350 MG/ML SOLN COMPARISON:  CT abdomen pelvis dated 03/13/2021 and chest radiograph dated 04/18/2021. FINDINGS: CTA CHEST FINDINGS Cardiovascular: Mild cardiomegaly. No pericardial effusion. Thoracic aorta is unremarkable. Origins of the great vessels of the aortic arch appear patent. Evaluation of the pulmonary arteries is limited due to respiratory motion artifact. No pulmonary artery embolus identified. Mediastinum/Nodes: No hilar or mediastinal adenopathy. Esophagus is grossly unremarkable. No mediastinal fluid collection. Lungs/Pleura: There is a streaky area of airspace density with somewhat patchy and nodular component extending from the right hilum into the right upper lobe which may represent atelectasis. Atypical infection is not excluded. Clinical  correlation and follow-up recommended. Minimal bibasilar dependent atelectasis. No pleural effusion or pneumothorax. The central airways are patent. Musculoskeletal: Degenerative changes of the spine. Mild diffuse subcutaneous edema. No fluid collection. No acute osseous pathology. Review of the MIP images confirms the above findings. CT ABDOMEN and PELVIS FINDINGS Intra-abdominal free air or free fluid. Hepatobiliary: The liver is unremarkable. No intrahepatic biliary ductal dilatation. Small gallstone. No pericholecystic fluid or evidence of acute cholecystitis by CT. Pancreas: Unremarkable. No pancreatic ductal dilatation or surrounding inflammatory changes. Spleen: Normal in size without focal abnormality. Adrenals/Urinary Tract: The adrenal glands unremarkable. There is no hydronephrosis on either side. There is symmetric enhancement and excretion of contrast by both kidneys. The visualized ureters and urinary bladder appear unremarkable. Stomach/Bowel: There is moderate stool throughout the colon. There is colonic diverticulosis. Mild pericolonic haziness adjacent to the ascending colon may be chronic or represent mild acute diverticulitis. Clinical correlation is recommended. No diverticular abscess or perforation. There is no bowel obstruction or active inflammation. The appendix is normal. Vascular/Lymphatic: Mild aortoiliac atherosclerotic disease. IVC is unremarkable. No pain venous gas. There is no adenopathy. Reproductive: The prostate and seminal vesicles are grossly unremarkable. No pelvic mass. Other: Is diffuse subcutaneous edema.  No fluid collection. Musculoskeletal: Degenerative changes of the spine. No acute osseous pathology. Review of the MIP images confirms the above findings. IMPRESSION: 1. No CT evidence of pulmonary embolism. 2. Streaky area of airspace density extending from the right  hilum into the right upper lobe may represent atelectasis. Atypical infection is not excluded. Clinical  correlation and follow-up recommended. 3. Colonic diverticulosis. Mild pericolonic haziness adjacent to the ascending colon may be chronic or represent mild acute diverticulitis. Clinical correlation is recommended. No diverticular abscess or perforation. 4. Cholelithiasis. 5. Aortic Atherosclerosis (ICD10-I70.0). Electronically Signed   By: Anner Crete M.D.   On: 04/28/2021 02:42   CT ABDOMEN PELVIS W CONTRAST  Result Date: 04/28/2021 CLINICAL DATA:  Fever of unknown origin. EXAM: CT ANGIOGRAPHY CHEST CT ABDOMEN AND PELVIS WITH CONTRAST TECHNIQUE: Multidetector CT imaging of the chest was performed using the standard protocol during bolus administration of intravenous contrast. Multiplanar CT image reconstructions and MIPs were obtained to evaluate the vascular anatomy. Multidetector CT imaging of the abdomen and pelvis was performed using the standard protocol during bolus administration of intravenous contrast. CONTRAST:  29m OMNIPAQUE IOHEXOL 350 MG/ML SOLN COMPARISON:  CT abdomen pelvis dated 03/13/2021 and chest radiograph dated 04/18/2021. FINDINGS: CTA CHEST FINDINGS Cardiovascular: Mild cardiomegaly. No pericardial effusion. Thoracic aorta is unremarkable. Origins of the great vessels of the aortic arch appear patent. Evaluation of the pulmonary arteries is limited due to respiratory motion artifact. No pulmonary artery embolus identified. Mediastinum/Nodes: No hilar or mediastinal adenopathy. Esophagus is grossly unremarkable. No mediastinal fluid collection. Lungs/Pleura: There is a streaky area of airspace density with somewhat patchy and nodular component extending from the right hilum into the right upper lobe which may represent atelectasis. Atypical infection is not excluded. Clinical correlation and follow-up recommended. Minimal bibasilar dependent atelectasis. No pleural effusion or pneumothorax. The central airways are patent. Musculoskeletal: Degenerative changes of the spine. Mild  diffuse subcutaneous edema. No fluid collection. No acute osseous pathology. Review of the MIP images confirms the above findings. CT ABDOMEN and PELVIS FINDINGS Intra-abdominal free air or free fluid. Hepatobiliary: The liver is unremarkable. No intrahepatic biliary ductal dilatation. Small gallstone. No pericholecystic fluid or evidence of acute cholecystitis by CT. Pancreas: Unremarkable. No pancreatic ductal dilatation or surrounding inflammatory changes. Spleen: Normal in size without focal abnormality. Adrenals/Urinary Tract: The adrenal glands unremarkable. There is no hydronephrosis on either side. There is symmetric enhancement and excretion of contrast by both kidneys. The visualized ureters and urinary bladder appear unremarkable. Stomach/Bowel: There is moderate stool throughout the colon. There is colonic diverticulosis. Mild pericolonic haziness adjacent to the ascending colon may be chronic or represent mild acute diverticulitis. Clinical correlation is recommended. No diverticular abscess or perforation. There is no bowel obstruction or active inflammation. The appendix is normal. Vascular/Lymphatic: Mild aortoiliac atherosclerotic disease. IVC is unremarkable. No pain venous gas. There is no adenopathy. Reproductive: The prostate and seminal vesicles are grossly unremarkable. No pelvic mass. Other: Is diffuse subcutaneous edema.  No fluid collection. Musculoskeletal: Degenerative changes of the spine. No acute osseous pathology. Review of the MIP images confirms the above findings. IMPRESSION: 1. No CT evidence of pulmonary embolism. 2. Streaky area of airspace density extending from the right hilum into the right upper lobe may represent atelectasis. Atypical infection is not excluded. Clinical correlation and follow-up recommended. 3. Colonic diverticulosis. Mild pericolonic haziness adjacent to the ascending colon may be chronic or represent mild acute diverticulitis. Clinical correlation is  recommended. No diverticular abscess or perforation. 4. Cholelithiasis. 5. Aortic Atherosclerosis (ICD10-I70.0). Electronically Signed   By: AAnner CreteM.D.   On: 04/28/2021 02:42   Recent Labs    04/27/21 1226 04/28/21 0544  WBC 22.6* 18.8*  HGB 8.0* 7.5*  HCT 25.7* 24.2*  PLT 569* 523*     Recent Labs    04/27/21 1226 04/28/21 0544  NA 135 135  K 4.8 4.1  CL 105 105  CO2 22 22  GLUCOSE 160* 166*  BUN 43* 39*  CREATININE 1.41* 1.31*  CALCIUM 9.4 9.1    Intake/Output Summary (Last 24 hours) at 04/29/2021 1044 Last data filed at 04/29/2021 0700 Gross per 24 hour  Intake 708 ml  Output 2350 ml  Net -1642 ml         Physical Exam: Vital Signs Blood pressure 129/72, pulse (!) 101, temperature 100 F (37.8 C), temperature source Oral, resp. rate 17, height '5\' 10"'$  (1.778 m), weight 92.9 kg, SpO2 100 %.      General: awake, alert, appropriate, pt sleeping- woke slowly; laying in bed; NAD HENT: conjugate gaze; oropharynx moist CV: borderline tachycardia, but better; no JVD Pulmonary: CTA B/L; no W/R/R- good air movement GI: soft, NT, ND, (+)BS Psychiatric: appropriate; depressed Neurological: alert    Neurological: alert Skin- dorsal pedal edema- 2-3+ dorsal pedal edema.  Has new swelling/worse of RUE- from IV- said not painful Neuro: Alert Motor: 4/5 in right and 5/5 left deltoid, bicep, tricep, grip, 4/5 B hip flexor, knee extensors, trace ankle dorsiflexor and plantar flexor (due to pain with active foot/ankle movement), unchanged  Assessment/Plan: 1. Functional deficits which require 3+ hours per day of interdisciplinary therapy in a comprehensive inpatient rehab setting. Physiatrist is providing close team supervision and 24 hour management of active medical problems listed below. Physiatrist and rehab team continue to assess barriers to discharge/monitor patient progress toward functional and medical goals  Care Tool:  Bathing    Body parts  bathed by patient: Face   Body parts bathed by helper: Buttocks, Right lower leg, Left lower leg     Bathing assist Assist Level: Independent with assistive device (per staff)     Upper Body Dressing/Undressing Upper body dressing   What is the patient wearing?: Pull over shirt    Upper body assist Assist Level: Independent with assistive device (per staff)    Lower Body Dressing/Undressing Lower body dressing      What is the patient wearing?: Underwear/pull up, Pants     Lower body assist Assist for lower body dressing: Independent with assitive device (per staff)     Toileting Toileting    Toileting assist Assist for toileting: Independent with assistive device (per staff)     Transfers Chair/bed transfer  Transfers assist     Chair/bed transfer assist level: Independent with assistive device Chair/bed transfer assistive device: Armrests   Locomotion Ambulation   Ambulation assist   Ambulation activity did not occur: Refused  Assist level: Supervision/Verbal cueing Assistive device: Walker-rolling Max distance: 300 ft   Walk 10 feet activity   Assist  Walk 10 feet activity did not occur: Refused  Assist level: Independent with assistive device Assistive device: Walker-rolling   Walk 50 feet activity   Assist Walk 50 feet with 2 turns activity did not occur: Refused  Assist level: Independent with assistive device Assistive device: Walker-rolling    Walk 150 feet activity   Assist Walk 150 feet activity did not occur: Refused  Assist level: Supervision/Verbal cueing Assistive device: Walker-rolling    Walk 10 feet on uneven surface  activity   Assist Walk 10 feet on uneven surfaces activity did not occur: Refused   Assist level: Supervision/Verbal cueing Assistive device: Walker-rolling   Wheelchair     Assist Is the patient using  a wheelchair?: Yes Type of Wheelchair: Manual Wheelchair activity did not occur: Refused  (Pt stated he would not leave his room)  Wheelchair assist level: Independent Max wheelchair distance: 250 ft    Wheelchair 50 feet with 2 turns activity    Assist    Wheelchair 50 feet with 2 turns activity did not occur: Refused   Assist Level: Independent   Wheelchair 150 feet activity     Assist  Wheelchair 150 feet activity did not occur: Refused   Assist Level: Independent   Blood pressure 129/72, pulse (!) 101, temperature 100 F (37.8 C), temperature source Oral, resp. rate 17, height '5\' 10"'$  (1.778 m), weight 92.9 kg, SpO2 100 %.  Medical Problem List and Plan: 1.  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 -d/c Monday since not making progress/not walking- he is aware. Con't PT and OT til d/c.   2.  Impaired mobility, was ambulating >200 feet: d/c Lovenox  Patient doesn't want lovenox. 8/24- restarted with concern of elevated WBC- doesn't have PE/D Dimer elevated- will con't.   3. Left shoulder/back/foot pain/Pain/diabetic peripheral neuropathy: continue Oxycodone prn. Increase steroids for gout flare. Will do 60 mg daily x 2 days, then taper. Pt rates pain as "11/10". Needs to bring in shoes so able to walk better; If steroids PO doesn't work, might need L shoulder injection. Pt hates needles.   8/1- added Lidoderm patches for Shoulder and back pain. 8pm to 8am  8/6 increase gabapentin to '300mg'$  TID, increase to 600 3 times daily on 8/13 Increase Oxy to 15 mg every 4 as needed , well controlled Discussed reducing this but pt feels like he would not be able to tolerate his foot pain.  His PCP Dr Jonelle Sidle was prescribing percocet 7.5 PTA 8/16 tenosynovitis and probable gouty arthritis, check urate, agree with ID will need steroid resumed, will restart taper at '40mg'$  and taper more slowly, ?rheum eval as OP - reduce to  '30mg'$  today and monitor if joint pain increases  Clinically improved RIght elbow Elevated WBC in joint , + monosodium urate , neg orgs c/w gout  8/23- reduced Oxycodone since when goes home, max dose he can take is 10 mg q4 hours (not 15 mg)- 8/24- will start MS contin 15 mg BID since can do that with federal law, but explained PCP might not cover?   8/25 - pt said slightly helpful which is confusing because it's 15 mg of Morphine 2x/day? Will not increase further.  4. Mood: LCSW to follow for evaluation and support.              -antipsychotic agents: N/A 5. Neuropsych: This patient is capable of making decisions on his own behalf. 6. Skin/Wound Care: Routine pressure relief measures.  7. Fluids/Electrolytes/Nutrition: Strict I/Os 8. PJP PNA/leukocytosis:  Duration of meds discussed with pharmacy who relayed that patient has completed 21+ days of steroids and primaquine. --Clindamycin thorough 07/29 to complete 21 days. ID consulted, appreciate recs-recommended VIR evaluation.   VIR are recommending foot ultrasound, showing avascular masses versus complex fluid collections  9. Gout flare: Was on colchicine 0.6 mg daily PTA. --prednisone taper completed 8/9- colchicine dose x2 again to see if will help feet pain.  8/24- on tapering prednisone- now likely chronic gout.  10. T2DM: Hgb A1c- 5.4. Was on glucotrol/metformin bid PTA.             --  continue Lantus while on steroids with SSI for elevated BS.  CBG (last 3)  Recent Labs    04/28/21 1653 04/28/21 2152 04/29/21 0615  GLUCAP 202* 149* 135*  Fair control some lability likely related to steroids  8/25- some lability but overall controlled-con't regimen 11. Anxiety d/o: Was on Klonopin TID PTA. --bid effective at this time. 12. HTN: Monitor BP tid--continue hydralazine and Norvasc.   Vitals:   04/29/21 0321 04/29/21 0727  BP: 133/85 129/72  Pulse: 99 (!) 101  Resp: 17 17  Temp: 98.2 F (36.8 C) 100 F (37.8 C)  SpO2: 98%  100%   8/25- BP labile, but usually controlled- con't regimen 13. HIV: On Epivir and Tivicay 14. Anemia: H/H steadily improving.   Hemoglobin 8.3 on 8/16 stable  15. Hypomagnesemia: MgOx 400 mg BID  Magnesium 1.7 on 8/9 16. Leukocytosis Appreciate ID recs-ordered Rh factor and quantiferon TB testing- also GC swabs  WBCs 13.1 on 8/13, confounded by steroids at present  Blood cultures NGTD  Fevers resolved  Started on Rocephin by ID  Await further ID recs    8/23- WBC up to 25.1k- even though prednisone dose down to 30 mg daily- have paged ID again. Also fever up to 100.5 last night.   8/24- WBC down to 18k- ID did FULL workup and cannot find anything- fevers have resolved- and pt looks much better- f/u with ID after d/c.   8/25- no more ID w/u unless fever >101.5- was 100.0 this AM 17. Dysphagia: Resolved 18. Urinary retention-resolved 19. Bowel incontinence- Improved 20. Chipped nails-?  Improving.  21. GERD  87/28-restarted Protonix- also has tums prn. 22. Pressure injury to buttocks: offload q69mnutes 23. Bilateral lower extremity swelling: ice, elevated, compression garments 24. Shoulder pain: continue lidocaine patch and voltaren gel.  25. Nearsighted- schedule outpatient ophthalmology eval- should be checked for diabetic retinopathy 26. Diabetic peripheral neuropathy: will benefit from outpatient Qutenza. Ordered diabetic shoes  -gabapentin increased as above  Mildly elevated on 8/13 27. Lower extremity edema:resumed '10mg'$  daily Lasix.  28. Acute exacerbation of CKD baseline BUN/ Creat about 30/1.3   Creatinine 1.65 on 8/15  IVF initiated on 8/17, repeat bmet 8/22  8/23- CR down to 1.35- stop IVFs so doesn't get fluid overloaded.   8/24- Cr 1.31- con't to monitor 29.  Mild hyponatremia  Sodium 134 on 8/14, continue to monitor 30. Dispo  8/25- will need f/u with Rheumatology   I spent a total of 36 minutes on visit/total care- >50% on coordination of care- speaking  with SW, nurse and pt about dispo, etc.    LOS: 30 days A FACE TO FACE EVALUATION WAS PERFORMED  Ayani Ospina 04/29/2021, 10:44 AM

## 2021-04-29 NOTE — Progress Notes (Signed)
Physical Therapy Session Note  Patient Details  Name: Bruce Hunter MRN: [id number] Date of Birth: Nov 05, 1975  Today's Date: 04/29/2021 PT Individual Time: 0905-1000 PT Individual Time Calculation (min): 55 min   Short Term Goals: Week 2:  PT Short Term Goal 1 (Week 2): =LTGs d/t ELOS  Skilled Therapeutic Interventions/Progress Updates: Pt presented in bed agreeable to therapy. Pt states pain 10+/10 as "did not receive meds on time". Emotional support and breaks provided as needed. With encouragement pt performed supine to sit mod I with use of bed features and spent extensive time encouraging pt to perform self massage for desensitization as well as self ROM. Provided continued education on importance of ROM to feet as well as wt bearing. Pt trying various methods to tolerate increased ROM of toes and ankle. Pt also intermittently placing feet on ground performing small but increasing heel slides to allow for increased wt bearing tolerance. Pt encouraged with significantly increased time to perform stand from elevated bed. Pt was able to stand from elevated bed with RW and supervision. Pt returned to bed at end of session with call bell within reach and needs met.      Therapy Documentation Precautions:  Precautions Precautions: Fall Precaution Comments: Hypersentive and painful feet Restrictions Weight Bearing Restrictions: No General:   Vital Signs: Therapy Vitals Temp: 100.2 F (37.9 C) Pulse Rate: (!) 109 Resp: 18 BP: (!) 161/88 Patient Position (if appropriate): Sitting Oxygen Therapy SpO2: 99 % O2 Device: Room Air Pain: Pain Assessment Pain Scale: 0-10 Pain Score: 10-Worst pain ever Pain Type: Acute pain Pain Location: Ankle Pain Orientation: Right;Left Pain Descriptors / Indicators: Aching Pain Frequency: Constant Pain Onset: On-going Pain Intervention(s): Medication (See eMAR)  Therapy/Group: Individual Therapy  Clytie Shetley Serafino Burciaga,  PTA  04/29/2021, 1:13 PM

## 2021-04-29 NOTE — Progress Notes (Signed)
Patient ID: Bruce Hunter, male   DOB: 01-May-1976, 45 y.o.   MRN: [id number] Spoke with Jackquline Denmark [phone] who is working on [place]. Gave her pt's direct number to contact him to discuss agency preference and setting up services. Went to see pt and he was on the phone with [facility] to work on setting up aide for home. Discussed issues with dog and he is not happy about this, but feels he can't take care of her now. He hopes brother will give her to someone he can keep track of her and see her. 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 He seems in better spirits now than this am. He is aware of his discharge Monday.

## 2021-04-30 ENCOUNTER — Other Ambulatory Visit (HOSPITAL_COMMUNITY): Payer: Self-pay

## 2021-04-30 LAB — GLUCOSE, CAPILLARY
Glucose-Capillary: 127 mg/dL — ABNORMAL HIGH (ref 70–99)
Glucose-Capillary: 133 mg/dL — ABNORMAL HIGH (ref 70–99)
Glucose-Capillary: 190 mg/dL — ABNORMAL HIGH (ref 70–99)
Glucose-Capillary: 156 mg/dL — ABNORMAL HIGH (ref 70–99)

## 2021-04-30 MED ORDER — METOPROLOL TARTRATE 50 MG PO TABS
50.0000 mg | ORAL_TABLET | Freq: Two times a day (BID) | ORAL | Status: DC
  Administered 2021-04-30 – 2021-05-03 (×6): 50 mg via ORAL
  Filled 2021-04-30 (×6): qty 1

## 2021-04-30 MED ORDER — DOVATO 50-300 MG PO TABS
1.0000 | ORAL_TABLET | Freq: Every day | ORAL | 0 refills | Status: DC
  Filled 2021-04-30: qty 30, 30d supply, fill #0

## 2021-04-30 MED ORDER — COVID-19 MRNA VAC-TRIS(PFIZER) 30 MCG/0.3ML IM SUSP
0.3000 mL | Freq: Once | INTRAMUSCULAR | Status: AC
  Administered 2021-04-30: 0.3 mL via INTRAMUSCULAR
  Filled 2021-04-30 (×2): qty 0.3

## 2021-04-30 NOTE — Progress Notes (Signed)
Physical Therapy Session Note  Patient Details  Name: Bruce Hunter MRN: [id number] Date of Birth: 05-07-1976  Today's Date: 04/30/2021 PT Individual Time: 1020-1045 PT Individual Time Calculation (min): 25 min   Short Term Goals: Week 2:  PT Short Term Goal 1 (Week 2): =LTGs d/t ELOS  Skilled Therapeutic Interventions/Progress Updates: Pt presented sitting EOB. Per nsg notes pt has been performing transfers bed to/from [facility] without assist nor supervision. Pt states just returned to EOB after having BM. Pt states pain was manageable until around 6am and had been spending time in w/c. Pt requesting to do laundry. PTA advised can assist with laundry however will require pt to transfer to w/c and propel to laundry room. Pt unsure if able to perform task, PTA wrapped B feet in ace bandages for edema and pain management. Pt tolerating ace bandages however continue to be limited by pain and unable to complete task. Pt noted to have tremors during time with PTA. Pt returned to supine expressing due to pain unable to complete any further tasks. Pt left in bed with call bell within reach and current needs met.      Therapy Documentation Precautions:  Precautions Precautions: Fall Precaution Comments: Hypersentive and painful feet Restrictions Weight Bearing Restrictions: No General: PT Amount of Missed Time (min): 15 Minutes PT Missed Treatment Reason: Pain Vital Signs: Therapy Vitals Temp: 98.8 F (37.1 C) Pulse Rate: (!) 108 Resp: 17 BP: (!) 144/84 Patient Position (if appropriate): Sitting Oxygen Therapy SpO2: 96 % O2 Device: Room Air Pain: Pain Assessment Pain Scale: 0-10 Pain Score: 2  Pain Type: Acute pain Pain Location: Foot Pain Orientation: Right;Left Pain Descriptors / Indicators: Aching Pain Frequency: Constant Pain Onset: On-going Pain Intervention(s): Medication (See eMAR) Mobility:   Locomotion :    Trunk/Postural Assessment :    Balance:   Exercises:    Other Treatments:      Therapy/Group: Individual Therapy  Kamea Dacosta 04/30/2021, 4:24 PM

## 2021-04-30 NOTE — Progress Notes (Signed)
PROGRESS NOTE   Subjective/Complaints:  Pt reports stood with therapy and walked a few steps (also per nursing note, turned off bed alarm and not using call light).   Pt reports he needs a COVID shot- only ever had 1- and doesn't remember the brand- but really wants one now.   Also asking who could build him a w/c ramp- explained that's the pt's responsibility.   Pain stable.   ROS:   Pt denies SOB, abd pain, CP, N/V/C/D, and vision changes     Objective:   No results found. Recent Labs    04/28/21 0544  WBC 18.8*  HGB 7.5*  HCT 24.2*  PLT 523*     Recent Labs    04/28/21 0544  NA 135  K 4.1  CL 105  CO2 22  GLUCOSE 166*  BUN 39*  CREATININE 1.31*  CALCIUM 9.1    Intake/Output Summary (Last 24 hours) at 04/30/2021 1352 Last data filed at 04/30/2021 1149 Gross per 24 hour  Intake 476 ml  Output 3250 ml  Net -2774 ml         Physical Exam: Vital Signs Blood pressure 135/76, pulse (!) 114, temperature 99.9 F (37.7 C), temperature source Oral, resp. rate 18, height '5\' 10"'$  (1.778 m), weight 92.9 kg, SpO2 97 %.       General: awake, alert, appropriate, laying in bed; looks more comfortable; NAD HENT: conjugate gaze; oropharynx moist CV: tachycardic  rate-- low 100s this AM; no JVD Pulmonary: CTA B/L; no W/R/R- good air movement GI: soft, NT, ND, (+)BS Psychiatric: appropriate Neurological: alert Skin- dorsal pedal edema- 2-3+ dorsal pedal edema. About the same today- up to mid calf though and 2-3+ up to mid calf-  Has new swelling/worse of RUE- from IV- said not painful Motor: 4/5 in right and 5/5 left deltoid, bicep, tricep, grip, 4/5 B hip flexor, knee extensors, trace ankle dorsiflexor and plantar flexor (due to pain with active foot/ankle movement), unchanged  Assessment/Plan: 1. Functional deficits which require 3+ hours per day of interdisciplinary therapy in a comprehensive  inpatient rehab setting. Physiatrist is providing close team supervision and 24 hour management of active medical problems listed below. Physiatrist and rehab team continue to assess barriers to discharge/monitor patient progress toward functional and medical goals  Care Tool:  Bathing    Body parts bathed by patient: Face   Body parts bathed by helper: Buttocks, Right lower leg, Left lower leg     Bathing assist Assist Level: Independent with assistive device (per staff)     Upper Body Dressing/Undressing Upper body dressing   What is the patient wearing?: Pull over shirt    Upper body assist Assist Level: Independent with assistive device (per staff)    Lower Body Dressing/Undressing Lower body dressing      What is the patient wearing?: Underwear/pull up, Pants     Lower body assist Assist for lower body dressing: Independent with assitive device (per staff)     Toileting Toileting    Toileting assist Assist for toileting: Independent with assistive device (per staff)     Transfers Chair/bed transfer  Transfers assist     Chair/bed transfer assist  level: Independent with assistive device Chair/bed transfer assistive device: Armrests   Locomotion Ambulation   Ambulation assist   Ambulation activity did not occur: Refused  Assist level: Supervision/Verbal cueing Assistive device: Walker-rolling Max distance: 300 ft   Walk 10 feet activity   Assist  Walk 10 feet activity did not occur: Refused  Assist level: Independent with assistive device Assistive device: Walker-rolling   Walk 50 feet activity   Assist Walk 50 feet with 2 turns activity did not occur: Refused  Assist level: Independent with assistive device Assistive device: Walker-rolling    Walk 150 feet activity   Assist Walk 150 feet activity did not occur: Refused  Assist level: Supervision/Verbal cueing Assistive device: Walker-rolling    Walk 10 feet on uneven surface   activity   Assist Walk 10 feet on uneven surfaces activity did not occur: Refused   Assist level: Supervision/Verbal cueing Assistive device: Chemical engineer     Assist Is the patient using a wheelchair?: Yes Type of Wheelchair: Manual Wheelchair activity did not occur: Refused (Pt stated he would not leave his room)  Wheelchair assist level: Independent Max wheelchair distance: 250 ft    Wheelchair 50 feet with 2 turns activity    Assist    Wheelchair 50 feet with 2 turns activity did not occur: Refused   Assist Level: Independent   Wheelchair 150 feet activity     Assist  Wheelchair 150 feet activity did not occur: Refused   Assist Level: Independent   Blood pressure 135/76, pulse (!) 114, temperature 99.9 F (37.7 C), temperature source Oral, resp. rate 18, height '5\' 10"'$  (1.778 m), weight 92.9 kg, SpO2 97 %.  Medical Problem List and Plan: 1.  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 -d/c Monday since not making progress/not walking- he is aware. Con't PT and OT til d/c.  -con't PT and OT- work on walking, at least small distances with therapy- encouraged not to turn off bed alarm.   2.  Impaired mobility, was ambulating >200 feet: d/c Lovenox  Patient doesn't want lovenox. 8/24- restarted with concern of elevated WBC- doesn't have PE/D Dimer elevated- will con't.   3. Left shoulder/back/foot pain/Pain/diabetic peripheral neuropathy: continue Oxycodone prn. Increase steroids for gout flare. Will do 60 mg daily x 2 days, then taper. Pt rates pain as "11/10". Needs to bring in shoes so able to walk better; If steroids PO doesn't work, might need L shoulder injection. Pt hates needles.   8/1- added Lidoderm patches for Shoulder and back pain. 8pm to 8am  8/6 increase gabapentin to '300mg'$  TID,  increase to 600 3 times daily on 8/13 Increase Oxy to 15 mg every 4 as needed , well controlled Discussed reducing this but pt feels like he would not be able to tolerate his foot pain.  His PCP Dr Bruce Hunter was prescribing percocet 7.5 PTA 8/16 tenosynovitis and probable gouty arthritis, check urate, agree with ID will need steroid resumed, will restart taper at '40mg'$  and taper more slowly, ?rheum eval as OP - reduce to '30mg'$  today and monitor if joint pain increases  Clinically improved RIght elbow Elevated WBC in joint , + monosodium urate , neg orgs c/w gout  8/23- reduced Oxycodone since when goes home, max dose he can take is 10 mg q4 hours (not 15 mg)-  8/24- will start MS contin 15 mg BID since can do that with federal law, but explained PCP might not cover?  8/25 - pt said slightly helpful which is confusing because it's 15 mg of Morphine 2x/day? Will not increase further.   8/26- pain stable- con't regimen 4. Mood: LCSW to follow for evaluation and support.              -antipsychotic agents: N/A 5. Neuropsych: This patient is capable of making decisions on his own behalf. 6. Skin/Wound Care: Routine pressure relief measures.  7. Fluids/Electrolytes/Nutrition: Strict I/Os 8. PJP PNA/leukocytosis:  Duration of meds discussed with pharmacy who relayed that patient has completed 21+ days of steroids and primaquine. --Clindamycin thorough 07/29 to complete 21 days. ID consulted, appreciate recs-recommended VIR evaluation.   VIR are recommending foot ultrasound, showing avascular masses versus complex fluid collections  9. Gout flare: Was on colchicine 0.6 mg daily PTA. --prednisone taper completed 8/9- colchicine dose x2 again to see if will help feet pain.  8/24- on tapering prednisone- now likely chronic gout.  10. T2DM: Hgb A1c- 5.4. Was on glucotrol/metformin bid PTA.             --continue Lantus while on steroids with SSI for elevated BS.  CBG (last 3)  Recent Labs    04/29/21 2115  04/30/21 0600 04/30/21 1145  GLUCAP 138* 127* 133*  Fair control some lability likely related to steroids  8/36- much better control- con't regimen 11. Anxiety d/o: Was on Klonopin TID PTA. --bid effective at this time. 12. HTN: Monitor BP tid--continue hydralazine and Norvasc.   Vitals:   04/30/21 0744 04/30/21 1146  BP: 135/84 135/76  Pulse: 97 (!) 114  Resp: 17 18  Temp: 99.9 F (37.7 C)   SpO2: 99% 97%   8/25- BP labile, but usually controlled- con't regimen 13. HIV: On Epivir and Tivicay 14. Anemia: H/H steadily improving.   Hemoglobin 8.3 on 8/16 stable  15. Hypomagnesemia: MgOx 400 mg BID  Magnesium 1.7 on 8/9 16. Leukocytosis Appreciate ID recs-ordered Rh factor and quantiferon TB testing- also GC swabs  WBCs 13.1 on 8/13, confounded by steroids at present  Blood cultures NGTD  Fevers resolved  Started on Rocephin by ID  Await further ID recs    8/23- WBC up to 25.1k- even though prednisone dose down to 30 mg daily- have paged ID again. Also fever up to 100.5 last night.   8/24- WBC down to 18k- ID did FULL workup and cannot find anything- fevers have resolved- and pt looks much better- f/u with ID after d/c.   8/25- no more ID w/u unless fever >101.5- was 100.0 this AM 17. Dysphagia: Resolved 18. Urinary retention-resolved 19. Bowel incontinence- Improved 20. Chipped nails-?  Improving.  21. GERD  87/28-restarted Protonix- also has tums prn. 22. Pressure injury to buttocks: offload q18mnutes 23. Bilateral lower extremity swelling: ice, elevated, compression garments 24. Shoulder pain: continue lidocaine patch and voltaren gel.  25. Nearsighted- schedule outpatient ophthalmology eval- should be checked for diabetic retinopathy 26. Diabetic peripheral neuropathy: will benefit from outpatient Qutenza. Ordered diabetic shoes  -gabapentin increased as above  Mildly elevated on 8/13 27. Lower extremity edema:resumed '10mg'$  daily Lasix.  28. Acute exacerbation of  CKD baseline BUN/ Creat about 30/1.3   Creatinine 1.65 on 8/15  IVF initiated on 8/17, repeat bmet 8/22  8/23- CR down to 1.35- stop IVFs so doesn't get fluid overloaded.   8/24- Cr 1.31- con't to monitor 29.  Mild hyponatremia  Sodium 134 on 8/14, continue to monitor 30. Tachycardia  8/26- heart rate keeps going up- will increase metoprolol to 50 mg BID and monitor since always in MEWS yellow, etc due to heart rate 30. Dispo  8/25- will need f/u with Rheumatology     LOS: 31 days A FACE TO FACE EVALUATION WAS PERFORMED  Bruce Hunter 04/30/2021, 1:52 PM

## 2021-04-30 NOTE — Progress Notes (Signed)
Inpatient Rehabilitation Care Coordinator Discharge Note   Patient Details  Name: Bruce Hunter MRN: [id number] Date of Birth: 1976/07/04   Discharge location: HOME WITH INTERMITTENT ASSIST FROM FAMILY AND FRIENDS  Length of Stay:  89 DAYS  Discharge activity level: MOD/I-WHEELCHAIR LEVEL-HAS ABILITY TO AMBULATE BUT DEPENDENT UPON FOOT PAIN  Home/community participation: YES  Patient response [id number] Literacy - How often do you need to have someone help you when you read instructions, pamphlets, or other written material from your doctor or pharmacy?: Never  Patient response [id number] Isolation - How often do you feel lonely or isolated from those around you?: Sometimes  Services provided included: MD, RD, PT, OT, RN, CM, TR, Pharmacy, SW, Neuropsych  Financial Services:  Charity fundraiser Utilized: Medicaid    Choices offered to/list presented to: PT  Follow-up services arranged:  DME, Patient/Family has no preference for HH/DME agencies, Other (Comment)      DME : [facility], 3 IN 1 AND TUB BENCH  [facility] WORKING ON NO HOME HEALTH AGENCY WILL ACCEPT PT'S INSURANCE [facility] REFERRAL MADE AND GAVE [facility]SPORTATION NUMBER TO PT TO FOLLOW UP WITH. PT DOWN DUE TO PAIN AND LACK OF MOBILITY AND BROTHER HAD TO GET RID OF HIS DOG DUE TO HIS INABILITY TO CARE FOR HER AT HOME.    Patient response to transportation need: Is the patient able to respond to transportation needs?: Yes In the past 12 months, has lack of transportation kept you from medical appointments or from getting medications?: No In the past 12 months, has lack of transportation kept you from meetings, work, or from getting things needed for daily living?: No    Comments (or additional information):PT HAVING DIFFICULTY WITH PAIN IN FEET, WILL NEED TO BE WHEELCHAIR LEVEL IF TOO PAINFUL TO AMBULATE WITH. WANTED TO GO TO MOM'S HOME BUT SHE SAID NO. DECLINED SNF DUE TO WOULD LOSE CHECK AND  WILL NOT DO THIS.  Patient/Family verbalized understanding of follow-up arrangements:  Yes  Individual responsible for coordination of the follow-up plan: SELF [phone]  Confirmed correct DME delivered: Elease Hashimoto 04/30/2021    Blessing Ozga, Bruce Hunter

## 2021-04-30 NOTE — Progress Notes (Signed)
Pt. Noted for transferring self to chair and bedside commode, turns bed alarm off and does not used call light for assistance. Educated pt. and the risk involved. Reinforced education and bed alarm for safety.

## 2021-05-01 LAB — GLUCOSE, CAPILLARY
Glucose-Capillary: 120 mg/dL — ABNORMAL HIGH (ref 70–99)
Glucose-Capillary: 164 mg/dL — ABNORMAL HIGH (ref 70–99)
Glucose-Capillary: 172 mg/dL — ABNORMAL HIGH (ref 70–99)
Glucose-Capillary: 120 mg/dL — ABNORMAL HIGH (ref 70–99)

## 2021-05-01 NOTE — Progress Notes (Signed)
PROGRESS NOTE   Subjective/Complaints:   Pt reports pain is doing a little better- appears more comfortable.  Is sore on R arm from COVID shot Had a bad evening yesterday due to pain, but received meds already and looks better.  No other side effects from COVID shot.   Things "pretty good"- which is new for pt.  Asking how he's ot get home- explained needs to get a ride.  Of note, pt NOT close with brother- he's 13 yrs older than pt.  Has feet wrapped with ACE wraps, but now legs are more swollen.   ROS:   Pt denies SOB, abd pain, CP, N/V/C/D, and vision changes     Objective:   No results found. No results for input(s): WBC, HGB, HCT, PLT in the last 72 hours.    No results for input(s): NA, K, CL, CO2, GLUCOSE, BUN, CREATININE, CALCIUM in the last 72 hours.   Intake/Output Summary (Last 24 hours) at 05/01/2021 1040 Last data filed at 05/01/2021 0815 Gross per 24 hour  Intake 834 ml  Output 2975 ml  Net -2141 ml         Physical Exam: Vital Signs Blood pressure (!) 146/90, pulse (!) 107, temperature 98.2 F (36.8 C), temperature source Oral, resp. rate 18, height '5\' 10"'$  (1.778 m), weight 92.9 kg, SpO2 96 %.        General: awake, alert, appropriate, sitting EOB eating breakfast; brighter affect; NAD HENT: conjugate gaze; oropharynx moist CV: mildly tachycardic rate; no JVD Pulmonary: CTA B/L; no W/R/R- good air movement GI: soft, NT, ND, (+)BS Psychiatric: appropriate; more interactive; smiling some Neurological: alert  Skin- Dorsal pedal edema down to 2+, but wearing ACE wraps- ankles and calves are up to 2-3+, not wrapped Has RUE IV Motor: 4/5 in right and 5/5 left deltoid, bicep, tricep, grip, 4/5 B hip flexor, knee extensors, trace ankle dorsiflexor and plantar flexor (due to pain with active foot/ankle movement), unchanged  Assessment/Plan: 1. Functional deficits which require 3+ hours per  day of interdisciplinary therapy in a comprehensive inpatient rehab setting. Physiatrist is providing close team supervision and 24 hour management of active medical problems listed below. Physiatrist and rehab team continue to assess barriers to discharge/monitor patient progress toward functional and medical goals  Care Tool:  Bathing    Body parts bathed by patient: Face   Body parts bathed by helper: Buttocks, Right lower leg, Left lower leg     Bathing assist Assist Level: Independent with assistive device (per staff)     Upper Body Dressing/Undressing Upper body dressing   What is the patient wearing?: Pull over shirt    Upper body assist Assist Level: Independent with assistive device (per staff)    Lower Body Dressing/Undressing Lower body dressing      What is the patient wearing?: Underwear/pull up, Pants     Lower body assist Assist for lower body dressing: Independent with assitive device (per staff)     Toileting Toileting    Toileting assist Assist for toileting: Independent with assistive device (per staff)     Transfers Chair/bed transfer  Transfers assist     Chair/bed transfer assist level: Independent  with assistive device Chair/bed transfer assistive device: Armrests   Locomotion Ambulation   Ambulation assist   Ambulation activity did not occur: Refused  Assist level: Supervision/Verbal cueing Assistive device: Walker-rolling Max distance: 300 ft   Walk 10 feet activity   Assist  Walk 10 feet activity did not occur: Refused  Assist level: Independent with assistive device Assistive device: Walker-rolling   Walk 50 feet activity   Assist Walk 50 feet with 2 turns activity did not occur: Refused  Assist level: Independent with assistive device Assistive device: Walker-rolling    Walk 150 feet activity   Assist Walk 150 feet activity did not occur: Refused  Assist level: Supervision/Verbal cueing Assistive device:  Walker-rolling    Walk 10 feet on uneven surface  activity   Assist Walk 10 feet on uneven surfaces activity did not occur: Refused   Assist level: Supervision/Verbal cueing Assistive device: Chemical engineer     Assist Is the patient using a wheelchair?: Yes Type of Wheelchair: Manual Wheelchair activity did not occur: Refused (Pt stated he would not leave his room)  Wheelchair assist level: Independent Max wheelchair distance: 250 ft    Wheelchair 50 feet with 2 turns activity    Assist    Wheelchair 50 feet with 2 turns activity did not occur: Refused   Assist Level: Independent   Wheelchair 150 feet activity     Assist  Wheelchair 150 feet activity did not occur: Refused   Assist Level: Independent   Blood pressure (!) 146/90, pulse (!) 107, temperature 98.2 F (36.8 C), temperature source Oral, resp. rate 18, height '5\' 10"'$  (1.778 m), weight 92.9 kg, SpO2 96 %.  Medical Problem List and Plan: 1.  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 -d/c Monday since not making progress/not walking- he is aware. Con't PT and OT til d/c.  -con't PT and OT- work on walking, at least small distances with therapy- encouraged not to turn off bed alarm -con't PT and OT_ advised pt to try and find ride home. .   2.  Impaired mobility, was ambulating >200 feet: d/c Lovenox  Patient doesn't want lovenox. 8/24- restarted with concern of elevated WBC- doesn't have PE/D Dimer elevated- will con't.   3. Left shoulder/back/foot pain/Pain/diabetic peripheral neuropathy: continue Oxycodone prn. Increase steroids for gout flare. Will do 60 mg daily x 2 days, then taper. Pt rates pain as "11/10". Needs to bring in shoes so able to walk better; If steroids PO doesn't work, might need L shoulder injection. Pt hates  needles.   8/1- added Lidoderm patches for Shoulder and back pain. 8pm to 8am  8/6 increase gabapentin to '300mg'$  TID, increase to 600 3 times daily on 8/13 Increase Oxy to 15 mg every 4 as needed , well controlled Discussed reducing this but pt feels like he would not be able to tolerate his foot pain.  His PCP Dr Jonelle Sidle was prescribing percocet 7.5 PTA 8/16 tenosynovitis and probable gouty arthritis, check urate, agree with ID will need steroid resumed, will restart taper at '40mg'$  and taper more slowly, ?rheum eval as OP - reduce to '30mg'$  today and monitor if joint pain increases  Clinically improved RIght elbow Elevated WBC in joint , + monosodium urate , neg orgs c/w gout  8/23- reduced Oxycodone since when goes home, max  dose he can take is 10 mg q4 hours (not 15 mg)- 8/24- will start MS contin 15 mg BID since can do that with federal law, but explained PCP might not cover?  8/25 - pt said slightly helpful which is confusing because it's 15 mg of Morphine 2x/day? Will not increase further.  8/27- pain looks better s/p AM meds- more comfortable- con't regimen 4. Mood: LCSW to follow for evaluation and support.              -antipsychotic agents: N/A 5. Neuropsych: This patient is capable of making decisions on his own behalf. 6. Skin/Wound Care: Routine pressure relief measures.  7. Fluids/Electrolytes/Nutrition: Strict I/Os 8. PJP PNA/leukocytosis:  Duration of meds discussed with pharmacy who relayed that patient has completed 21+ days of steroids and primaquine. --Clindamycin thorough 07/29 to complete 21 days. ID consulted, appreciate recs-recommended VIR evaluation.   VIR are recommending foot ultrasound, showing avascular masses versus complex fluid collections  8/27- will d/c IV since not using 9. Gout flare: Was on colchicine 0.6 mg daily PTA. --prednisone taper completed 8/9- colchicine dose x2 again to see if will help feet pain.  8/24- on tapering prednisone- now likely chronic  gout.  10. T2DM: Hgb A1c- 5.4. Was on glucotrol/metformin bid PTA.             --continue Lantus while on steroids with SSI for elevated BS.  CBG (last 3)  Recent Labs    04/30/21 1625 04/30/21 2100 05/01/21 0615  GLUCAP 190* 156* 120*  Fair control some lability likely related to steroids  8/26- much better control- con't regimen 11. Anxiety d/o: Was on Klonopin TID PTA. --bid effective at this time. 12. HTN: Monitor BP tid--continue hydralazine and Norvasc.   Vitals:   05/01/21 0343 05/01/21 0812  BP: (!) 141/85 (!) 146/90  Pulse: 99 (!) 107  Resp: 17 18  Temp: 98.6 F (37 C) 98.2 F (36.8 C)  SpO2: 96% 96%   8/25- BP labile, but usually controlled- con't regimen 13. HIV: On Epivir and Tivicay 14. Anemia: H/H steadily improving.   Hemoglobin 8.3 on 8/16 stable  15. Hypomagnesemia: MgOx 400 mg BID  Magnesium 1.7 on 8/9 16. Leukocytosis Appreciate ID recs-ordered Rh factor and quantiferon TB testing- also GC swabs  WBCs 13.1 on 8/13, confounded by steroids at present  Blood cultures NGTD  Fevers resolved  Started on Rocephin by ID  Await further ID recs    8/23- WBC up to 25.1k- even though prednisone dose down to 30 mg daily- have paged ID again. Also fever up to 100.5 last night.   8/24- WBC down to 18k- ID did FULL workup and cannot find anything- fevers have resolved- and pt looks much better- f/u with ID after d/c.   8/25- no more ID w/u unless fever >101.5- was 100.0 this AM 17. Dysphagia: Resolved 18. Urinary retention-resolved 19. Bowel incontinence- Improved 20. Chipped nails-?  Improving.  21. GERD  87/28-restarted Protonix- also has tums prn. 22. Pressure injury to buttocks: offload q46mnutes 23. Bilateral lower extremity swelling: ice, elevated, compression garments 24. Shoulder pain: continue lidocaine patch and voltaren gel.  25. Nearsighted- schedule outpatient ophthalmology eval- should be checked for diabetic retinopathy 26. Diabetic peripheral  neuropathy: will benefit from outpatient Qutenza. Ordered diabetic shoes  -gabapentin increased as above  Mildly elevated on 8/13 27. Lower extremity edema:resumed '10mg'$  daily Lasix.   8/27- will have pt have ACE wraps form feet to knees B/L- should help edema- also  elevate legs/feet when in bed 28. Acute exacerbation of CKD baseline BUN/ Creat about 30/1.3   Creatinine 1.65 on 8/15  IVF initiated on 8/17, repeat bmet 8/22  8/23- CR down to 1.35- stop IVFs so doesn't get fluid overloaded.   8/24- Cr 1.31- con't to monitor 29.  Mild hyponatremia  Sodium 134 on 8/14, continue to monitor 30. Tachycardia  8/26- heart rate keeps going up- will increase metoprolol to 50 mg BID and monitor since always in MEWS yellow, etc due to heart rate  8/27- HR doing better with increase in Metoprolol - will con't current regimen 30. Dispo  8/25- will need f/u with Rheumatology  8/27- d/c Monday- might need ride home?? Pt checking into it    LOS: 32 days A FACE TO FACE EVALUATION WAS PERFORMED  Raegyn Renda 05/01/2021, 10:40 AM

## 2021-05-01 NOTE — Progress Notes (Signed)
Occupational Therapy Session Note  Patient Details  Name: Bruce Hunter MRN: [id number] Date of Birth: 06-29-1976  Today's Date: 05/02/2021 OT Individual Time: 9357-0177 OT Individual Time Calculation (min): 42 min    Short Term Goals: Week 1:  OT Short Term Goal 1 (Week 1): ELOS=STGs due to ELOS OT Short Term Goal 1 - Progress (Week 1): Met OT Short Term Goal 2 (Week 1): Pt will perform LB dress with Mod A at sit > stand OT Short Term Goal 2 - Progress (Week 1): Met OT Short Term Goal 3 (Week 1): Pt will perform ADL routine with no more than Min cues for sequencing/problem solving OT Short Term Goal 3 - Progress (Week 1): Met OT Short Term Goal 4 (Week 1): Pt will complete ADL routine/activity of choice for 10 minutes without fatigue OT Short Term Goal 4 - Progress (Week 1): Met  Skilled Therapeutic Interventions/Progress Updates:    Pt greeted while seated, visibly shaking due to pain in ankles. Per RN, pt premedicated for pain this AM. Pt declined warm or cold compress, was agreeable to allow OT to ACE wrap his LEs. Pt stated this helped. He wanted to transfer to the w/c to brush his teeth at the sink, did not want to stand with RW for transfer due to pain, therefore completed squat pivot<w/c with supervision. Oral care/grooming tasks completed w/c level at the sink with setup assist. Throughout session, continued education regarding diaphragmatic breathing for holistic pain mgt, also worked on mindful visualization techniques. OT provided him with lavender aromatherapy as well to promote relaxation/calmness. He remained sitting up in the w/c, left with all needs within reach and LEs elevated.   Therapy Documentation Precautions:  Precautions Precautions: Fall Precaution Comments: Hypersentive and painful feet Restrictions Weight Bearing Restrictions: No Pain: Pain Assessment Pain Score: 9  ADL: ADL Eating: Independent (per staff) Grooming: Modified independent (per  staff) Where Assessed-Grooming: Sitting at sink Upper Body Bathing: Modified independent (per staff) Where Assessed-Upper Body Bathing: Sitting at sink Lower Body Bathing: Modified independent (per staff) Where Assessed-Lower Body Bathing: Sitting at sink, Standing at sink Upper Body Dressing: Modified independent (Device) (per staff) Where Assessed-Upper Body Dressing: Standing at sink Lower Body Dressing: Modified independent (per staff) Where Assessed-Lower Body Dressing: Sitting at sink, Standing at sink Toileting: Modified independent (per staff) Toilet Transfer: Modified independent (per staff) Toilet Transfer Method: Squat pivot Tub/Shower Transfer: Close supervison Social research officer, government: Not assessed ADL Comments: Using clinical judgement, pt can perform BADLs from bedlevel at Mod I level     Therapy/Group: Individual Therapy  Bela Bonaparte A Jaileen Janelle 05/02/2021, 12:34 PM

## 2021-05-02 LAB — GLUCOSE, CAPILLARY
Glucose-Capillary: 113 mg/dL — ABNORMAL HIGH (ref 70–99)
Glucose-Capillary: 138 mg/dL — ABNORMAL HIGH (ref 70–99)
Glucose-Capillary: 186 mg/dL — ABNORMAL HIGH (ref 70–99)
Glucose-Capillary: 159 mg/dL — ABNORMAL HIGH (ref 70–99)

## 2021-05-02 MED ORDER — METHOCARBAMOL 500 MG PO TABS
500.0000 mg | ORAL_TABLET | Freq: Four times a day (QID) | ORAL | Status: DC | PRN
  Administered 2021-05-02 – 2021-05-03 (×5): 500 mg via ORAL
  Filled 2021-05-02 (×5): qty 1

## 2021-05-02 NOTE — Progress Notes (Signed)
Physical Therapy Session Note  Patient Details  Name: Bruce Hunter MRN: [id number] Date of Birth: [birth year]  Today's Date: 05/02/2021 PT Individual Time: [id number] PT Individual Time Calculation (min): 38 min   Short Term Goals: Week 2:  PT Short Term Goal 1 (Week 2): =LTGs d/t ELOS  Skilled Therapeutic Interventions/Progress Updates:    Attempted to see patient at scheduled therapy time of 1300, pt using bedside commode for toileting. Pt given time to finish toileting. Pt finished with toileting and agreeable to PT session at 1307, pt missed 7 min of scheduled therapy session for toileting. Squat pivot transfers BSC to bed to w/c at mod I level throughout session. Manual w/c propulsion up to 150 ft with use of BUE at mod I level. Sit to stand with Supervision to RW. Pt able to take 3 steps with RW and Supervision with close w/c follow before requesting to sit due to B foot pain, not rated and declines intervention. Encouraged patient to keep LE elevated when not in therapy for edema management. Pt declines to perform car transfer this date as he has done that "a million times". Seated ball rebounder toss, 2 x 25 reps to fatigue. Pt left seated EOB with needs in reach at end of session.  Therapy Documentation Precautions:  Precautions Precautions: Fall Precaution Comments: Hypersentive and painful feet Restrictions Weight Bearing Restrictions: No      Therapy/Group: Individual Therapy   Excell Seltzer, PT, DPT, CSRS  05/02/2021, 5:22 PM

## 2021-05-02 NOTE — Progress Notes (Signed)
Occupational Therapy Session Note  Patient Details  Name: Bruce Hunter MRN: [id number] Date of Birth: 08-12-1976  Today's Date: 05/03/2021 OT Individual Time: 1132-1205 OT Individual Time Calculation (min): 33 min   Short Term Goals: Week 2:  OT Short Term Goal 1 (Week 2): STGs = LTGs at Mod I  Skilled Therapeutic Interventions/Progress Updates:    Pt greeted in bed and premedicated for pain, already washed/dressed and packed up for d/c home. He requested to go to [employer] to get some snacks for the ride home. Mod I for lateral scoot<w/c. He reported that he has been using the [facility] at Mod I level in the room. Worked on UB strengthening/endurance + LE strengthening while pt self propelled the w/c without leg rests. Pt able to engage in IADL task of purchasing food with Mod I w/c level. He refused to stand due to LE pain and not wanting to "push it" before going home today. Pt returned to the room via w/c and then completed another transfer back to bed. OT re-wrapped his LEs for edema mgt and pain mgt per pt request. Left him in care of NT for routine vitals.   Therapy Documentation Precautions:  Precautions Precautions: Fall Precaution Comments: Hypersentive and painful feet Restrictions Weight Bearing Restrictions: No Vital Signs: Therapy Vitals Temp: 100.3 F (37.9 C) Temp Source: Oral Pulse Rate: 86 Resp: 18 BP: (!) 145/81 Patient Position (if appropriate): Sitting Oxygen Therapy SpO2: 98 % O2 Device: Room Air Pain: Pain Assessment Pain Score: 3  ADL: ADL Eating: Independent (per staff) Grooming: Modified independent (per staff) Where Assessed-Grooming: Sitting at sink Upper Body Bathing: Modified independent (per staff) Where Assessed-Upper Body Bathing: Sitting at sink Lower Body Bathing: Modified independent (per staff) Where Assessed-Lower Body Bathing: Sitting at sink, Standing at sink Upper Body Dressing: Modified independent (Device) (per staff) Where  Assessed-Upper Body Dressing: Standing at sink Lower Body Dressing: Modified independent (per staff) Where Assessed-Lower Body Dressing: Sitting at sink, Standing at sink Toileting: Modified independent (per staff) Toilet Transfer: Modified independent (per staff) Toilet Transfer Method: Squat pivot Tub/Shower Transfer: Close supervison Social research officer, government: Not assessed ADL Comments: Using clinical judgement, pt can perform BADLs from bedlevel at Mod I level      Therapy/Group: Individual Therapy  Ashanna Heinsohn A Detrick Dani 05/03/2021, 12:31 PM

## 2021-05-02 NOTE — Progress Notes (Signed)
PROGRESS NOTE   Subjective/Complaints:   Pt reports swelling much better with ACE wraps, however while I was in room, started having stabbing/sharp pains in R ankle- across the top- had already had his tylenol and prn oxycodone Feels like in the bone- not sure if spasms/tight "just hurts".  Also has a knot from previous IV in R upper arm- will order Ice for this 4x/day- can also use warm compresses- but he likes ice. .   ROS:   Pt denies SOB, abd pain, CP, N/V/C/D, and vision changes    Objective:   No results found. No results for input(s): WBC, HGB, HCT, PLT in the last 72 hours.    No results for input(s): NA, K, CL, CO2, GLUCOSE, BUN, CREATININE, CALCIUM in the last 72 hours.   Intake/Output Summary (Last 24 hours) at 05/02/2021 1416 Last data filed at 05/02/2021 0745 Gross per 24 hour  Intake 354 ml  Output 1900 ml  Net -1546 ml         Physical Exam: Vital Signs Blood pressure (!) 152/94, pulse (!) 105, temperature 100.1 F (37.8 C), temperature source Oral, resp. rate 17, height '5\' 10"'$  (1.778 m), weight 92.9 kg, SpO2 99 %.         General: awake, alert, appropriate, sitting up at EOB, initially comfortable, but then started howling in pain; grabbing R ankle; looked like muscle spasm; NAD HENT: conjugate gaze; oropharynx moist CV: mild tachycardic rate; no JVD Pulmonary: CTA B/L; no W/R/R- good air movement GI: soft, NT, ND, (+)BS Psychiatric: appropriate; more interactive Neurological: Ox3   Skin- Dorsal pedal edema down to 2+, ACE wraps not on, however Swelling was much better on feet and calves- 2+ at max- no redness, etc or TTP over dorsum of R ankle where hurts so much. Also has a slightly erythematous area/knot where had a R upper arm infiltrate- not cellulitis at this time.  Has RUE IV Motor: 4/5 in right and 5/5 left deltoid, bicep, tricep, grip, 4/5 B hip flexor, knee extensors, trace  ankle dorsiflexor and plantar flexor (due to pain with active foot/ankle movement), unchanged  Assessment/Plan: 1. Functional deficits which require 3+ hours per day of interdisciplinary therapy in a comprehensive inpatient rehab setting. Physiatrist is providing close team supervision and 24 hour management of active medical problems listed below. Physiatrist and rehab team continue to assess barriers to discharge/monitor patient progress toward functional and medical goals  Care Tool:  Bathing    Body parts bathed by patient: Face   Body parts bathed by helper: Buttocks, Right lower leg, Left lower leg     Bathing assist Assist Level: Independent with assistive device (per staff)     Upper Body Dressing/Undressing Upper body dressing   What is the patient wearing?: Pull over shirt    Upper body assist Assist Level: Independent with assistive device (per staff)    Lower Body Dressing/Undressing Lower body dressing      What is the patient wearing?: Underwear/pull up, Pants     Lower body assist Assist for lower body dressing: Independent with assitive device (per staff)     Toileting Toileting    Toileting assist Assist for  toileting: Independent with assistive device (per staff)     Transfers Chair/bed transfer  Transfers assist     Chair/bed transfer assist level: Independent with assistive device Chair/bed transfer assistive device: Armrests   Locomotion Ambulation   Ambulation assist   Ambulation activity did not occur: Refused  Assist level: Supervision/Verbal cueing Assistive device: Walker-rolling Max distance: 300 ft   Walk 10 feet activity   Assist  Walk 10 feet activity did not occur: Refused  Assist level: Independent with assistive device Assistive device: Walker-rolling   Walk 50 feet activity   Assist Walk 50 feet with 2 turns activity did not occur: Refused  Assist level: Independent with assistive device Assistive device:  Walker-rolling    Walk 150 feet activity   Assist Walk 150 feet activity did not occur: Refused  Assist level: Supervision/Verbal cueing Assistive device: Walker-rolling    Walk 10 feet on uneven surface  activity   Assist Walk 10 feet on uneven surfaces activity did not occur: Refused   Assist level: Supervision/Verbal cueing Assistive device: Chemical engineer     Assist Is the patient using a wheelchair?: Yes Type of Wheelchair: Manual Wheelchair activity did not occur: Refused (Pt stated he would not leave his room)  Wheelchair assist level: Independent Max wheelchair distance: 250 ft    Wheelchair 50 feet with 2 turns activity    Assist    Wheelchair 50 feet with 2 turns activity did not occur: Refused   Assist Level: Independent   Wheelchair 150 feet activity     Assist  Wheelchair 150 feet activity did not occur: Refused   Assist Level: Independent   Blood pressure (!) 152/94, pulse (!) 105, temperature 100.1 F (37.8 C), temperature source Oral, resp. rate 17, height '5\' 10"'$  (1.778 m), weight 92.9 kg, SpO2 99 %.  Medical Problem List and Plan: 1.  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 -d/c Monday since not making progress/not walking- he is aware. Con't PT and OT til d/c.  -con't PT and OT- work on walking, at least small distances with therapy- encouraged not to turn off bed alarm -con't PT and OT_ advised pt to try and find ride home.  Con't PT and OT- d/c tomorrow   2.  Impaired mobility, was ambulating >200 feet: d/c Lovenox  Patient doesn't want lovenox. 8/24- restarted with concern of elevated WBC- doesn't have PE/D Dimer elevated- will con't.   3. Left shoulder/back/foot pain/Pain/diabetic peripheral neuropathy: continue Oxycodone prn. Increase steroids for gout  flare. Will do 60 mg daily x 2 days, then taper. Pt rates pain as "11/10". Needs to bring in shoes so able to walk better; If steroids PO doesn't work, might need L shoulder injection. Pt hates needles.   8/1- added Lidoderm patches for Shoulder and back pain. 8pm to 8am  8/6 increase gabapentin to '300mg'$  TID, increase to 600 3 times daily on 8/13 Increase Oxy to 15 mg every 4 as needed , well controlled Discussed reducing this but pt feels like he would not be able to tolerate his foot pain.  His PCP Dr Jonelle Sidle was prescribing percocet 7.5 PTA 8/16 tenosynovitis and probable gouty arthritis, check urate, agree with ID will need steroid resumed, will restart taper at '40mg'$  and taper more slowly, ?rheum eval as OP - reduce to '30mg'$  today and  monitor if joint pain increases  Clinically improved RIght elbow Elevated WBC in joint , + monosodium urate , neg orgs c/w gout  8/23- reduced Oxycodone since when goes home, max dose he can take is 10 mg q4 hours (not 15 mg)- 8/24- will start MS contin 15 mg BID since can do that with federal law, but explained PCP might not cover?  8/25 - pt said slightly helpful which is confusing because it's 15 mg of Morphine 2x/day? Will not increase further.  8/28- will add Robaxin 50 mg q6 hours prn for R ankle- think it's a muscle spasm 4. Mood: LCSW to follow for evaluation and support.              -antipsychotic agents: N/A 5. Neuropsych: This patient is capable of making decisions on his own behalf. 6. Skin/Wound Care: Routine pressure relief measures.  7. Fluids/Electrolytes/Nutrition: Strict I/Os 8. PJP PNA/leukocytosis:  Duration of meds discussed with pharmacy who relayed that patient has completed 21+ days of steroids and primaquine. --Clindamycin thorough 07/29 to complete 21 days. ID consulted, appreciate recs-recommended VIR evaluation.   VIR are recommending foot ultrasound, showing avascular masses versus complex fluid collections  8/27- will d/c IV since not  using 8/28- ice or warm compresses QID for R upper arm where IV infiltrated 9. Gout flare: Was on colchicine 0.6 mg daily PTA. --prednisone taper completed 8/9- colchicine dose x2 again to see if will help feet pain.  8/24- on tapering prednisone- now likely chronic gout.  10. T2DM: Hgb A1c- 5.4. Was on glucotrol/metformin bid PTA.             --continue Lantus while on steroids with SSI for elevated BS.  CBG (last 3)  Recent Labs    05/01/21 2102 05/02/21 0615 05/02/21 1143  GLUCAP 120* 113* 138*  Fair control some lability likely related to steroids  8/26- much better control- con't regimen 11. Anxiety d/o: Was on Klonopin TID PTA. --bid effective at this time. 12. HTN: Monitor BP tid--continue hydralazine and Norvasc.   Vitals:   05/02/21 0815 05/02/21 1147  BP: (!) 160/101 (!) 152/94  Pulse: 98 (!) 105  Resp: 19 17  Temp: 99.2 F (37.3 C) 100.1 F (37.8 C)  SpO2: 100% 99%   8/25- BP labile, but usually controlled- con't regimen 13. HIV: On Epivir and Tivicay 14. Anemia: H/H steadily improving.   Hemoglobin 8.3 on 8/16 stable  15. Hypomagnesemia: MgOx 400 mg BID  Magnesium 1.7 on 8/9 16. Leukocytosis Appreciate ID recs-ordered Rh factor and quantiferon TB testing- also GC swabs  WBCs 13.1 on 8/13, confounded by steroids at present  Blood cultures NGTD  Fevers resolved  Started on Rocephin by ID  Await further ID recs    8/23- WBC up to 25.1k- even though prednisone dose down to 30 mg daily- have paged ID again. Also fever up to 100.5 last night.   8/24- WBC down to 18k- ID did FULL workup and cannot find anything- fevers have resolved- and pt looks much better- f/u with ID after d/c.   8/25- no more ID w/u unless fever >101.5- was 100.0 this AM 17. Dysphagia: Resolved 18. Urinary retention-resolved 19. Bowel incontinence- Improved 20. Chipped nails-?  Improving.  21. GERD  87/28-restarted Protonix- also has tums prn. 22. Pressure injury to buttocks: offload  q60mnutes 23. Bilateral lower extremity swelling: ice, elevated, compression garments 24. Shoulder pain: continue lidocaine patch and voltaren gel.  25. Nearsighted- schedule outpatient ophthalmology eval- should be  checked for diabetic retinopathy 26. Diabetic peripheral neuropathy: will benefit from outpatient Qutenza. Ordered diabetic shoes  -gabapentin increased as above  Mildly elevated on 8/13 27. Lower extremity edema:resumed '10mg'$  daily Lasix.   8/27- will have pt have ACE wraps form feet to knees B/L- should help edema- also elevate legs/feet when in bed 28. Acute exacerbation of CKD baseline BUN/ Creat about 30/1.3   Creatinine 1.65 on 8/15  IVF initiated on 8/17, repeat bmet 8/22  8/23- CR down to 1.35- stop IVFs so doesn't get fluid overloaded.   8/24- Cr 1.31- con't to monitor 29.  Mild hyponatremia  Sodium 134 on 8/14, continue to monitor 30. Tachycardia  8/26- heart rate keeps going up- will increase metoprolol to 50 mg BID and monitor since always in MEWS yellow, etc due to heart rate  8/27- HR doing better with increase in Metoprolol - will con't current regimen 30. Dispo  8/25- will need f/u with Rheumatology  8/27- d/c Monday- might need ride home?? Pt checking into it    LOS: 33 days A FACE TO FACE EVALUATION WAS PERFORMED  Dillion Stowers 05/02/2021, 2:16 PM

## 2021-05-03 ENCOUNTER — Other Ambulatory Visit (HOSPITAL_COMMUNITY): Payer: Self-pay

## 2021-05-03 LAB — CBC WITH DIFFERENTIAL/PLATELET
Abs Immature Granulocytes: 0.65 10*3/uL — ABNORMAL HIGH (ref 0.00–0.07)
Basophils Absolute: 0 10*3/uL (ref 0.0–0.1)
Basophils Relative: 0 %
Eosinophils Absolute: 0.1 10*3/uL (ref 0.0–0.5)
Eosinophils Relative: 0 %
HCT: 24.1 % — ABNORMAL LOW (ref 39.0–52.0)
Hemoglobin: 7.3 g/dL — ABNORMAL LOW (ref 13.0–17.0)
Immature Granulocytes: 3 %
Lymphocytes Relative: 9 %
Lymphs Abs: 1.8 10*3/uL (ref 0.7–4.0)
MCH: 29.1 pg (ref 26.0–34.0)
MCHC: 30.3 g/dL (ref 30.0–36.0)
MCV: 96 fL (ref 80.0–100.0)
Monocytes Absolute: 2.2 10*3/uL — ABNORMAL HIGH (ref 0.1–1.0)
Monocytes Relative: 11 %
Neutro Abs: 15.7 10*3/uL — ABNORMAL HIGH (ref 1.7–7.7)
Neutrophils Relative %: 77 %
Platelets: 361 10*3/uL (ref 150–400)
RBC: 2.51 MIL/uL — ABNORMAL LOW (ref 4.22–5.81)
RDW: 18.6 % — ABNORMAL HIGH (ref 11.5–15.5)
WBC: 20.4 10*3/uL — ABNORMAL HIGH (ref 4.0–10.5)
nRBC: 0 % (ref 0.0–0.2)

## 2021-05-03 LAB — BASIC METABOLIC PANEL
Anion gap: 10 (ref 5–15)
BUN: 51 mg/dL — ABNORMAL HIGH (ref 6–20)
CO2: 24 mmol/L (ref 22–32)
Calcium: 9.2 mg/dL (ref 8.9–10.3)
Chloride: 103 mmol/L (ref 98–111)
Creatinine, Ser: 1.48 mg/dL — ABNORMAL HIGH (ref 0.61–1.24)
GFR, Estimated: 59 mL/min — ABNORMAL LOW (ref >60)
Glucose, Bld: 109 mg/dL — ABNORMAL HIGH (ref 70–99)
Potassium: 4.2 mmol/L (ref 3.5–5.1)
Sodium: 137 mmol/L (ref 135–145)

## 2021-05-03 LAB — MAGNESIUM: Magnesium: 1.5 mg/dL — ABNORMAL LOW (ref 1.7–2.4)

## 2021-05-03 LAB — GLUCOSE, CAPILLARY
Glucose-Capillary: 96 mg/dL (ref 70–99)
Glucose-Capillary: 167 mg/dL — ABNORMAL HIGH (ref 70–99)

## 2021-05-03 LAB — PARVOVIRUS B19 ANTIBODY, IGG AND IGM
Parovirus B19 IgG Abs: 0.4 index (ref 0.0–0.8)
Parovirus B19 IgM Abs: 0.2 index (ref 0.0–0.8)

## 2021-05-03 MED ORDER — TAMSULOSIN HCL 0.4 MG PO CAPS
0.4000 mg | ORAL_CAPSULE | Freq: Every day | ORAL | 0 refills | Status: AC
  Filled 2021-05-03: qty 30, 30d supply, fill #0

## 2021-05-03 MED ORDER — PREDNISONE 10 MG PO TABS
30.0000 mg | ORAL_TABLET | Freq: Every day | ORAL | 0 refills | Status: DC
  Filled 2021-05-03: qty 90, 30d supply, fill #0

## 2021-05-03 MED ORDER — FUROSEMIDE 40 MG PO TABS
40.0000 mg | ORAL_TABLET | ORAL | 0 refills | Status: DC
  Filled 2021-05-03: qty 15, 35d supply, fill #0

## 2021-05-03 MED ORDER — FUROSEMIDE 40 MG PO TABS: 40.0000 mg | ORAL_TABLET | ORAL | 0 refills | Status: DC

## 2021-05-03 MED ORDER — HYDRALAZINE HCL 25 MG PO TABS: 25.0000 mg | ORAL_TABLET | Freq: Three times a day (TID) | ORAL | 0 refills | Status: DC

## 2021-05-03 MED ORDER — CYANOCOBALAMIN 1000 MCG PO TABS
1000.0000 ug | ORAL_TABLET | Freq: Every day | ORAL | 2 refills | Status: DC
  Filled 2021-05-03: qty 30, 30d supply, fill #0
  Filled 2021-06-02 (×2): qty 30, 30d supply, fill #1
  Filled 2021-07-05 (×2): qty 100, 100d supply, fill #2
  Filled 2021-10-20 – 2021-10-27 (×2): qty 100, 100d supply, fill #3
  Filled 2022-02-14: qty 30, 30d supply, fill #0
  Filled 2022-02-15: qty 40, 40d supply, fill #0
  Filled 2022-02-22: qty 100, 100d supply, fill #0

## 2021-05-03 MED ORDER — MAGNESIUM OXIDE 400 MG PO TABS
400.0000 mg | ORAL_TABLET | Freq: Two times a day (BID) | ORAL | 0 refills | Status: DC
  Filled 2021-05-03: qty 60, 30d supply, fill #0

## 2021-05-03 MED ORDER — GABAPENTIN 300 MG PO CAPS: 600.0000 mg | ORAL_CAPSULE | Freq: Three times a day (TID) | ORAL | 0 refills | Status: DC

## 2021-05-03 MED ORDER — DOVATO 50-300 MG PO TABS: 1.0000 | ORAL_TABLET | Freq: Every day | ORAL | 0 refills | Status: DC

## 2021-05-03 MED ORDER — METHOCARBAMOL 500 MG PO TABS
500.0000 mg | ORAL_TABLET | Freq: Four times a day (QID) | ORAL | 0 refills | Status: AC | PRN
  Filled 2021-05-03: qty 120, 30d supply, fill #0

## 2021-05-03 MED ORDER — ADULT MULTIVITAMIN W/MINERALS CH: 1.0000 | ORAL_TABLET | Freq: Every day | ORAL | Status: DC

## 2021-05-03 MED ORDER — DAPSONE 100 MG PO TABS
100.0000 mg | ORAL_TABLET | Freq: Every day | ORAL | 0 refills | Status: DC
  Filled 2021-05-03: qty 30, 30d supply, fill #0

## 2021-05-03 MED ORDER — MORPHINE SULFATE ER 15 MG PO TBCR
15.0000 mg | EXTENDED_RELEASE_TABLET | Freq: Two times a day (BID) | ORAL | 0 refills | Status: DC
  Filled 2021-05-03: qty 10, 5d supply, fill #0

## 2021-05-03 MED ORDER — ACETAMINOPHEN 325 MG PO TABS: 325.0000 mg | ORAL_TABLET | ORAL | 0 refills | Status: DC | PRN

## 2021-05-03 MED ORDER — LIDOCAINE 5 % EX PTCH
2.0000 | MEDICATED_PATCH | CUTANEOUS | 0 refills | Status: DC
  Filled 2021-05-03 – 2021-06-24 (×2): qty 30, 15d supply, fill #0

## 2021-05-03 MED ORDER — CLONAZEPAM 0.5 MG PO TABS
0.5000 mg | ORAL_TABLET | Freq: Three times a day (TID) | ORAL | 0 refills | Status: DC
Start: 2021-05-03 — End: 2021-06-24
  Filled 2021-05-03: qty 90, 30d supply, fill #0

## 2021-05-03 MED ORDER — METOPROLOL TARTRATE 50 MG PO TABS: 50.0000 mg | ORAL_TABLET | Freq: Two times a day (BID) | ORAL | 0 refills | Status: DC

## 2021-05-03 MED ORDER — ACETAMINOPHEN 325 MG PO TABS
325.0000 mg | ORAL_TABLET | ORAL | 0 refills | Status: DC | PRN
  Filled 2021-05-03: qty 100, 17d supply, fill #0

## 2021-05-03 MED ORDER — INSULIN PEN NEEDLE 32G X 4 MM MISC
1.0000 "application " | Freq: Every day | 0 refills | Status: AC
  Filled 2021-05-03: qty 100, 30d supply, fill #0

## 2021-05-03 MED ORDER — HYDRALAZINE HCL 25 MG PO TABS
25.0000 mg | ORAL_TABLET | Freq: Three times a day (TID) | ORAL | 0 refills | Status: DC
Start: 2021-05-03 — End: 2021-07-04
  Filled 2021-05-03: qty 90, 30d supply, fill #0

## 2021-05-03 MED ORDER — DOVATO 50-300 MG PO TABS
1.0000 | ORAL_TABLET | Freq: Every day | ORAL | 0 refills | Status: DC
  Filled 2021-05-03: qty 30, 30d supply, fill #0

## 2021-05-03 MED ORDER — SEMGLEE 100 UNIT/ML ~~LOC~~ SOPN: 5.0000 [IU] | PEN_INJECTOR | Freq: Every day | SUBCUTANEOUS | 0 refills | Status: DC

## 2021-05-03 MED ORDER — TAMSULOSIN HCL 0.4 MG PO CAPS
0.4000 mg | ORAL_CAPSULE | Freq: Every day | ORAL | 0 refills | Status: DC
Start: 2021-05-03 — End: 2021-05-03

## 2021-05-03 MED ORDER — DICLOFENAC SODIUM 1 % EX GEL
2.0000 g | Freq: Four times a day (QID) | CUTANEOUS | 0 refills | Status: DC
Start: 2021-05-03 — End: 2021-05-03

## 2021-05-03 MED ORDER — DICLOFENAC SODIUM 1 % EX GEL
2.0000 g | Freq: Four times a day (QID) | CUTANEOUS | 0 refills | Status: DC
Start: 2021-05-03 — End: 2021-09-09
  Filled 2021-05-03: qty 300, 30d supply, fill #0

## 2021-05-03 MED ORDER — DAPSONE 100 MG PO TABS: 100.0000 mg | ORAL_TABLET | Freq: Every day | ORAL | 0 refills | Status: DC

## 2021-05-03 MED ORDER — LANTUS SOLOSTAR 100 UNIT/ML ~~LOC~~ SOPN
5.0000 [IU] | PEN_INJECTOR | Freq: Every day | SUBCUTANEOUS | 0 refills | Status: AC
  Filled 2021-05-03 – 2022-02-14 (×2): qty 3, 56d supply, fill #0
  Filled 2022-02-22: qty 3, 28d supply, fill #0

## 2021-05-03 MED ORDER — GABAPENTIN 300 MG PO CAPS
600.0000 mg | ORAL_CAPSULE | Freq: Three times a day (TID) | ORAL | 0 refills | Status: DC
  Filled 2021-05-03: qty 180, 30d supply, fill #0

## 2021-05-03 MED ORDER — METHOCARBAMOL 500 MG PO TABS: 500.0000 mg | ORAL_TABLET | Freq: Four times a day (QID) | ORAL | 0 refills | Status: DC | PRN

## 2021-05-03 MED ORDER — OXYCODONE HCL 10 MG PO TABS
10.0000 mg | ORAL_TABLET | ORAL | 0 refills | Status: DC | PRN
  Filled 2021-05-03: qty 25, 5d supply, fill #0

## 2021-05-03 MED ORDER — PREDNISONE 10 MG PO TABS: 30.0000 mg | ORAL_TABLET | Freq: Every day | ORAL | 0 refills | Status: DC

## 2021-05-03 MED ORDER — METOPROLOL TARTRATE 50 MG PO TABS
50.0000 mg | ORAL_TABLET | Freq: Two times a day (BID) | ORAL | 0 refills | Status: DC
  Filled 2021-05-03: qty 60, 30d supply, fill #0

## 2021-05-03 MED ORDER — OXYCODONE HCL 10 MG PO TABS: 10.0000 mg | ORAL_TABLET | ORAL | 0 refills | Status: DC | PRN

## 2021-05-03 MED ORDER — MAGNESIUM OXIDE -MG SUPPLEMENT 400 (240 MG) MG PO TABS
400.0000 mg | ORAL_TABLET | Freq: Two times a day (BID) | ORAL | 0 refills | Status: DC
Start: 2021-05-03 — End: 2021-05-03

## 2021-05-03 MED ORDER — CLONAZEPAM 0.5 MG PO TABS: 0.5000 mg | ORAL_TABLET | Freq: Three times a day (TID) | ORAL | 0 refills | Status: DC

## 2021-05-03 MED ORDER — MORPHINE SULFATE ER 15 MG PO TBCR: 15.0000 mg | EXTENDED_RELEASE_TABLET | Freq: Two times a day (BID) | ORAL | 0 refills | Status: DC

## 2021-05-03 NOTE — Progress Notes (Signed)
Pt set for DC home with mother-belongings all packed-per DC pharmacy they have received no orders for medications for DC-Pamela Love PA notified-message left-will continue to follow

## 2021-05-03 NOTE — Progress Notes (Signed)
Patient ID: BOOKERT GUZZI, male   DOB: [birth year], 45 y.o.   MRN: [id number] Met with pt who reports his Mom is coming to transport home later this afternoon. Pam-PA to send prescriptions to [place]. Work toward discharge this afternoon.

## 2021-05-03 NOTE — Progress Notes (Signed)
PROGRESS NOTE   Subjective/Complaints: Ready for d/c today Discussed that we are trying to get medications up to his room Discussed Qutenza outpatient and he is excited to try this  ROS:   Pt denies SOB, abd pain, CP, N/V/C/D, and vision changes, +peripheral neuropathy    Objective:   No results found. Recent Labs    05/03/21 0541  WBC 20.4*  HGB 7.3*  HCT 24.1*  PLT 361      Recent Labs    05/03/21 0541  NA 137  K 4.2  CL 103  CO2 24  GLUCOSE 109*  BUN 51*  CREATININE 1.48*  CALCIUM 9.2     Intake/Output Summary (Last 24 hours) at 05/03/2021 1129 Last data filed at 05/03/2021 0754 Gross per 24 hour  Intake 732 ml  Output 525 ml  Net 207 ml         Physical Exam: Vital Signs Blood pressure 133/80, pulse 94, temperature 99.8 F (37.7 C), temperature source Oral, resp. rate 18, height '5\' 10"'$  (1.778 m), weight 92.9 kg, SpO2 97 %.  Gen: no distress, normal appearing HEENT: oral mucosa pink and moist, NCAT Cardio: Reg rate Chest: normal effort, normal rate of breathing Abd: soft, non-distended Ext: no edema Psych: pleasant, normal affect Skin- Dorsal pedal edema down to 2+, ACE wraps not on, however Swelling was much better on feet and calves- 2+ at max- no redness, etc or TTP over dorsum of R ankle where hurts so much. Also has a slightly erythematous area/knot where had a R upper arm infiltrate- not cellulitis at this time.  Has RUE IV Motor: 4/5 in right and 5/5 left deltoid, bicep, tricep, grip, 4/5 B hip flexor, knee extensors, trace ankle dorsiflexor and plantar flexor (due to pain with active foot/ankle movement), unchanged  Assessment/Plan: 1. Functional deficits which require 3+ hours per day of interdisciplinary therapy in a comprehensive inpatient rehab setting. Physiatrist is providing close team supervision and 24 hour management of active medical problems listed below. Physiatrist  and rehab team continue to assess barriers to discharge/monitor patient progress toward functional and medical goals  Care Tool:  Bathing    Body parts bathed by patient: Face   Body parts bathed by helper: Buttocks, Right lower leg, Left lower leg     Bathing assist Assist Level: Independent with assistive device (per staff)     Upper Body Dressing/Undressing Upper body dressing   What is the patient wearing?: Pull over shirt    Upper body assist Assist Level: Independent with assistive device (per staff)    Lower Body Dressing/Undressing Lower body dressing      What is the patient wearing?: Underwear/pull up, Pants     Lower body assist Assist for lower body dressing: Independent with assitive device (per staff)     Toileting Toileting    Toileting assist Assist for toileting: Independent with assistive device (per staff)     Transfers Chair/bed transfer  Transfers assist     Chair/bed transfer assist level: Independent with assistive device Chair/bed transfer assistive device: Armrests   Locomotion Ambulation   Ambulation assist   Ambulation activity did not occur: Refused  Assist level: Supervision/Verbal cueing  Assistive device: Walker-rolling Max distance: 300 ft   Walk 10 feet activity   Assist  Walk 10 feet activity did not occur: Refused  Assist level: Independent with assistive device Assistive device: Walker-rolling   Walk 50 feet activity   Assist Walk 50 feet with 2 turns activity did not occur: Refused  Assist level: Independent with assistive device Assistive device: Walker-rolling    Walk 150 feet activity   Assist Walk 150 feet activity did not occur: Refused  Assist level: Supervision/Verbal cueing Assistive device: Walker-rolling    Walk 10 feet on uneven surface  activity   Assist Walk 10 feet on uneven surfaces activity did not occur: Refused   Assist level: Supervision/Verbal cueing Assistive device:  Chemical engineer     Assist Is the patient using a wheelchair?: Yes Type of Wheelchair: Manual Wheelchair activity did not occur: Refused (Pt stated he would not leave his room)  Wheelchair assist level: Independent Max wheelchair distance: 250 ft    Wheelchair 50 feet with 2 turns activity    Assist    Wheelchair 50 feet with 2 turns activity did not occur: Refused   Assist Level: Independent   Wheelchair 150 feet activity     Assist  Wheelchair 150 feet activity did not occur: Refused   Assist Level: Independent   Blood pressure 133/80, pulse 94, temperature 99.8 F (37.7 C), temperature source Oral, resp. rate 18, height '5\' 10"'$  (1.778 m), weight 92.9 kg, SpO2 97 %.  Medical Problem List and Plan: 1.  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  Impaired mobility, was ambulating >200 feet: d/c Lovenox  Patient doesn't want lovenox. 8/24- restarted with concern of elevated WBC- doesn't have PE/D Dimer elevated- will con't.   3. Left shoulder/back/foot pain/Pain/diabetic peripheral neuropathy: continue Oxycodone prn. Increase steroids for gout flare. Will do 60 mg daily x 2 days, then taper. Pt rates pain as "11/10". Needs to bring in shoes so able to walk better; If steroids PO doesn't work, might need L shoulder injection. Pt hates needles.   8/1- added Lidoderm patches for Shoulder and back pain. 8pm to 8am  8/6 increase gabapentin to '300mg'$  TID, increase to 600 3 times daily on 8/13 Increase Oxy to 15 mg every 4 as needed , well controlled Discussed reducing this but pt feels like he would not be able to tolerate his foot pain.  His PCP Dr Jonelle Sidle was prescribing percocet 7.5 PTA 8/16 tenosynovitis and probable gouty arthritis, check urate, agree with ID will need steroid resumed, will restart taper at '40mg'$  and taper more slowly, ?rheum eval as OP - reduce to '30mg'$  today and monitor if joint  pain increases  Clinically improved RIght elbow Elevated WBC in joint , + monosodium urate , neg orgs c/w gout  8/23- reduced Oxycodone since when goes home, max dose he can take is 10 mg q4 hours (not 15 mg)- 8/24- will start MS contin 15 mg BID since can do that with federal law, but explained PCP might not cover?  8/25 - pt said slightly helpful which is confusing because it's 15 mg of Morphine 2x/day? Will not increase further.  8/28- will add Robaxin 50 mg q6 hours prn for R ankle- think it's a muscle spasm  8/29: provided with pain relief journal to use at home.  4. Mood: LCSW to follow for evaluation and support.              -  antipsychotic agents: N/A 5. Neuropsych: This patient is capable of making decisions on his own behalf. 6. Skin/Wound Care: Routine pressure relief measures.  7. Fluids/Electrolytes/Nutrition: Strict I/Os 8. PJP PNA/leukocytosis:  Duration of meds discussed with pharmacy who relayed that patient has completed 21+ days of steroids and primaquine. --Clindamycin thorough 07/29 to complete 21 days. ID consulted, appreciate recs-recommended VIR evaluation.   VIR are recommending foot ultrasound, showing avascular masses versus complex fluid collections  8/27- will d/c IV since not using Continue ice or warm compresses QID for R upper arm where IV infiltrated 9. Gout flare: Was on colchicine 0.6 mg daily PTA. --prednisone taper completed 8/9- colchicine dose x2 again to see if will help feet pain.  8/24- on tapering prednisone- now likely chronic gout.  10. T2DM: Hgb A1c- 5.4. Was on glucotrol/metformin bid PTA.             --continue Lantus while on steroids with SSI for elevated BS.  CBG (last 3)  Recent Labs    05/02/21 1635 05/02/21 2104 05/03/21 0547  GLUCAP 186* 159* 96  Fair control some lability likely related to steroids  8/29- much better control- continue regimen 11. Anxiety d/o: Was on Klonopin TID PTA. --bid effective at this time. 12. HTN:  Monitor BP tid--continue hydralazine and Norvasc.   Vitals:   05/03/21 0422 05/03/21 0846  BP: (!) 149/95 133/80  Pulse: 98 94  Resp: 16 18  Temp: 98.5 F (36.9 C) 99.8 F (37.7 C)  SpO2: 98% 97%   8/25- BP labile, but usually controlled- con't regimen 13. HIV: On Epivir and Tivicay 14. Anemia: H/H steadily improving.   Hemoglobin 8.3 on 8/16 stable  15. Hypomagnesemia: MgOx 400 mg BID  Magnesium 1.7 on 8/9 16. Leukocytosis Appreciate ID recs-ordered Rh factor and quantiferon TB testing- also GC swabs  WBCs 13.1 on 8/13, confounded by steroids at present  Blood cultures NGTD  Fevers resolved  Started on Rocephin by ID  Await further ID recs    8/23- WBC up to 25.1k- even though prednisone dose down to 30 mg daily- have paged ID again. Also fever up to 100.5 last night.   8/24- WBC down to 18k- ID did FULL workup and cannot find anything- fevers have resolved- and pt looks much better- f/u with ID after d/c.   8/25- no more ID w/u unless fever >101.5- was 100.0 this AM 17. Dysphagia: Resolved 18. Urinary retention-resolved 19. Bowel incontinence- Improved 20. Chipped nails-?  Improving.  21. GERD  87/28-restarted Protonix- also has tums prn. 22. Pressure injury to buttocks: offload q36mnutes 23. Bilateral lower extremity swelling: ice, elevated, compression garments 24. Shoulder pain: continue lidocaine patch and voltaren gel.  25. Nearsighted- schedule outpatient ophthalmology eval- should be checked for diabetic retinopathy 26. Diabetic peripheral neuropathy: will benefit from outpatient Qutenza. Ordered diabetic shoes  -gabapentin increased as above  Mildly elevated on 8/13 27. Lower extremity edema:resumed '10mg'$  daily Lasix.   8/27- will have pt have ACE wraps form feet to knees B/L- should help edema- also elevate legs/feet when in bed 28. Acute exacerbation of CKD baseline BUN/ Creat about 30/1.3   Creatinine 1.65 on 8/15  IVF initiated on 8/17, repeat bmet  8/22  8/23- CR down to 1.35- stop IVFs so doesn't get fluid overloaded.   8/24- Cr 1.31- con't to monitor 29.  Mild hyponatremia  Sodium 134 on 8/14, continue to monitor 30. Tachycardia  8/26- heart rate keeps going up- will increase metoprolol to 50 mg  BID and monitor since always in MEWS yellow, etc due to heart rate  8/27- HR doing better with increase in Metoprolol - will con't current regimen 30. Dispo  8/25- will need f/u with Rheumatology  8/27- d/c Monday- might need ride home?? Pt checking into it   >30 minutes spent in discharge of patient including review of medications and follow-up appointments, physical examination, and in answering all patient's questions     LOS: 34 days A FACE TO FACE EVALUATION WAS [place] 05/03/2021, 11:29 AM

## 2021-05-03 NOTE — Progress Notes (Signed)
Physical Therapy Session Note  Patient Details  Name: Bruce Hunter MRN: [id number] Date of Birth: February 21, 1976  Today's Date: 05/03/2021 PT Individual Time: 0915-0950 PT Individual Time Calculation (min): 35 min   Short Term Goals: Week 1:  PT Short Term Goal 1 (Week 1): =LTGs d/t ELOS PT Short Term Goal 1 - Progress (Week 1): Met PT Short Term Goal 2 (Week 1): Pt will tolerate x 30 minutes of activity PT Short Term Goal 2 - Progress (Week 1): Met PT Short Term Goal 3 (Week 1): Pt will tolerate sitting OOB for 30 + minutes PT Short Term Goal 3 - Progress (Week 1): Met  Skilled Therapeutic Interventions/Progress Updates:    Pt seated EOB on arrival and agreeable to therapy. Session focused on helping pt pack up belongings in preparation for d/c. Pt performed dynamic functional reaching to pack items in provided bags. MD and PA both consulted during session to discuss d/c planning. Pt then took 2 steps with RW mod I and stated this "wore him out." Pt was left seated EOB with bed alarm active and all needs in reach.   Therapy Documentation Precautions:  Precautions Precautions: Fall Precaution Comments: Hypersentive and painful feet Restrictions Weight Bearing Restrictions: No     Therapy/Group: Individual Therapy  Mickel Fuchs 05/03/2021, 4:38 PM

## 2021-05-03 NOTE — Plan of Care (Signed)
  Problem: RH Balance Goal: LTG Patient will maintain dynamic standing with ADLs (OT) Description: LTG:  Patient will maintain dynamic standing balance with assist during activities of daily living (OT)  Outcome: Not Met (add Reason) Note: Pt is unable to tolerate standing due to significant pain in LEs, therefore goal was unable to be met. Pt able to compensate and perform self care tasks at a seated level   Problem: RH Toilet Transfers Goal: LTG Patient will perform toilet transfers w/assist (OT) Description: LTG: Patient will perform toilet transfers with assist, with/without cues using equipment (OT) Outcome: Completed/Met

## 2021-05-03 NOTE — Discharge Summary (Signed)
Physician Discharge Summary  Patient ID: Bruce Hunter MRN: [id number] DOB/AGE: [birth year] 45 y.o.  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 Left lung clear. Heart is normal size. No effusions or acute bony abnormality. IMPRESSION: No active cardiopulmonary disease. Electronically Signed   By: Rolm Baptise M.D.   On: 04/14/2021 17:15   DG Chest 2 View  Result Date: 04/13/2021 CLINICAL DATA:  Leukocytosis. EXAM: CHEST - 2 VIEW COMPARISON:  03/21/2021. FINDINGS: Interval extubation. Removal of the previously seen enteric tube and right IJ sheath. Overall improved lung aeration with persistent but improved streaky opacities in the right suprahilar and left basilar lung. No visible pneumothorax or pleural effusion. Similar mild enlargement the cardiac silhouette. IMPRESSION: 1. Improved lung aeration with persistent but improved streaky opacities in the right suprahilar and left basilar lung. 2. Interval extubation with removal of enteric tube and right IJ sheath. Electronically Signed   By: Margaretha Sheffield MD   On: 04/13/2021 10:08   CT Angio Chest Pulmonary Embolism (PE) W or WO Contrast  Result Date: 04/28/2021 CLINICAL DATA:  Fever of unknown origin. EXAM: CT ANGIOGRAPHY CHEST CT ABDOMEN AND PELVIS WITH CONTRAST TECHNIQUE: Multidetector CT imaging of the chest was  performed using the standard protocol during bolus administration of intravenous contrast. Multiplanar CT image reconstructions and MIPs were obtained to evaluate the vascular anatomy. Multidetector CT imaging of the abdomen and pelvis was performed using the standard protocol during bolus administration of intravenous contrast. CONTRAST:  81m OMNIPAQUE IOHEXOL 350 MG/ML SOLN COMPARISON:  CT abdomen pelvis dated 03/13/2021 and chest radiograph dated 04/18/2021. FINDINGS: CTA CHEST FINDINGS Cardiovascular: Mild cardiomegaly. No pericardial effusion. Thoracic aorta is unremarkable. Origins of the great vessels of the aortic arch appear patent. Evaluation of the pulmonary arteries is limited due to respiratory motion artifact. No pulmonary artery embolus identified. Mediastinum/Nodes: No hilar or mediastinal adenopathy. Esophagus is grossly unremarkable. No mediastinal fluid collection. Lungs/Pleura: There is a streaky area of airspace density with somewhat patchy and nodular component extending from the right hilum into the right upper lobe which may represent atelectasis. Atypical infection is not excluded. Clinical correlation and follow-up recommended. Minimal bibasilar dependent atelectasis. No pleural effusion or pneumothorax. The central airways are patent. Musculoskeletal: Degenerative changes of the spine. Mild diffuse subcutaneous edema. No fluid collection. No acute osseous pathology. Review of the MIP images confirms the above findings. CT ABDOMEN and PELVIS FINDINGS Intra-abdominal free air or free fluid. Hepatobiliary: The liver is unremarkable. No intrahepatic biliary ductal dilatation. Small gallstone. No pericholecystic fluid or evidence of acute cholecystitis by CT. Pancreas: Unremarkable. No pancreatic ductal dilatation or surrounding inflammatory changes. Spleen: Normal in size without focal abnormality. Adrenals/Urinary Tract: The adrenal glands unremarkable. There is no hydronephrosis on either  side. There is symmetric enhancement and excretion of contrast by both kidneys. The visualized ureters and urinary  bladder appear unremarkable. Stomach/Bowel: There is moderate stool throughout the colon. There is colonic diverticulosis. Mild pericolonic haziness adjacent to the ascending colon may be chronic or represent mild acute diverticulitis. Clinical correlation is recommended. No diverticular abscess or perforation. There is no bowel obstruction or active inflammation. The appendix is normal. Vascular/Lymphatic: Mild aortoiliac atherosclerotic disease. IVC is unremarkable. No pain venous gas. There is no adenopathy. Reproductive: The prostate and seminal vesicles are grossly unremarkable. No pelvic mass. Other: Is diffuse subcutaneous edema.  No fluid collection. Musculoskeletal: Degenerative changes of the spine. No acute osseous pathology. Review of the MIP images confirms the above findings. IMPRESSION: 1. No CT evidence of pulmonary embolism. 2. Streaky area of airspace density extending from the right hilum into the right upper lobe may represent atelectasis. Atypical infection is not excluded. Clinical correlation and follow-up recommended. 3. Colonic diverticulosis. Mild pericolonic haziness adjacent to the ascending colon may be chronic or represent mild acute diverticulitis. Clinical correlation is recommended. No diverticular abscess or perforation. 4. Cholelithiasis. 5. Aortic Atherosclerosis (ICD10-I70.0). Electronically Signed   By: Anner Crete M.D.   On: 04/28/2021 02:42   CT ABDOMEN PELVIS W CONTRAST  Result Date: 04/28/2021 CLINICAL DATA:  Fever of unknown origin. EXAM: CT ANGIOGRAPHY CHEST CT ABDOMEN AND PELVIS WITH CONTRAST TECHNIQUE: Multidetector CT imaging of the chest was performed using the standard protocol during bolus administration of intravenous contrast. Multiplanar CT image reconstructions and MIPs were obtained to evaluate the vascular anatomy. Multidetector CT imaging  of the abdomen and pelvis was performed using the standard protocol during bolus administration of intravenous contrast. CONTRAST:  35m OMNIPAQUE IOHEXOL 350 MG/ML SOLN COMPARISON:  CT abdomen pelvis dated 03/13/2021 and chest radiograph dated 04/18/2021. FINDINGS: CTA CHEST FINDINGS Cardiovascular: Mild cardiomegaly. No pericardial effusion. Thoracic aorta is unremarkable. Origins of the great vessels of the aortic arch appear patent. Evaluation of the pulmonary arteries is limited due to respiratory motion artifact. No pulmonary artery embolus identified. Mediastinum/Nodes: No hilar or mediastinal adenopathy. Esophagus is grossly unremarkable. No mediastinal fluid collection. Lungs/Pleura: There is a streaky area of airspace density with somewhat patchy and nodular component extending from the right hilum into the right upper lobe which may represent atelectasis. Atypical infection is not excluded. Clinical correlation and follow-up recommended. Minimal bibasilar dependent atelectasis. No pleural effusion or pneumothorax. The central airways are patent. Musculoskeletal: Degenerative changes of the spine. Mild diffuse subcutaneous edema. No fluid collection. No acute osseous pathology. Review of the MIP images confirms the above findings. CT ABDOMEN and PELVIS FINDINGS Intra-abdominal free air or free fluid. Hepatobiliary: The liver is unremarkable. No intrahepatic biliary ductal dilatation. Small gallstone. No pericholecystic fluid or evidence of acute cholecystitis by CT. Pancreas: Unremarkable. No pancreatic ductal dilatation or surrounding inflammatory changes. Spleen: Normal in size without focal abnormality. Adrenals/Urinary Tract: The adrenal glands unremarkable. There is no hydronephrosis on either side. There is symmetric enhancement and excretion of contrast by both kidneys. The visualized ureters and urinary bladder appear unremarkable. Stomach/Bowel: There is moderate stool throughout the colon. There  is colonic diverticulosis. Mild pericolonic haziness adjacent to the ascending colon may be chronic or represent mild acute diverticulitis. Clinical correlation is recommended. No diverticular abscess or perforation. There is no bowel obstruction or active inflammation. The appendix is normal. Vascular/Lymphatic: Mild aortoiliac atherosclerotic disease. IVC is unremarkable. No pain venous gas. There is no adenopathy. Reproductive: The prostate and seminal vesicles are grossly unremarkable. No pelvic mass. Other: Is diffuse subcutaneous edema.  No fluid collection. Musculoskeletal: Degenerative  changes of the spine. No acute osseous pathology. Review of the MIP images confirms the above findings. IMPRESSION: 1. No CT evidence of pulmonary embolism. 2. Streaky area of airspace density extending from the right hilum into the right upper lobe may represent atelectasis. Atypical infection is not excluded. Clinical correlation and follow-up recommended. 3. Colonic diverticulosis. Mild pericolonic haziness adjacent to the ascending colon may be chronic or represent mild acute diverticulitis. Clinical correlation is recommended. No diverticular abscess or perforation. 4. Cholelithiasis. 5. Aortic Atherosclerosis (ICD10-I70.0). Electronically Signed   By: Anner Crete M.D.   On: 04/28/2021 02:42   MR ELBOW RIGHT W WO CONTRAST  Result Date: 04/22/2021 CLINICAL DATA:  Joint effusion, elbow EXAM: MRI OF THE RIGHT ELBOW WITHOUT AND WITH CONTRAST TECHNIQUE: Multiplanar, multisequence MR imaging of the elbow was performed before and after the administration of intravenous contrast. CONTRAST:  5m GADAVIST GADOBUTROL 1 MMOL/ML IV SOLN COMPARISON:  None. FINDINGS: TENDONS Common forearm flexor origin: Intact Common forearm extensor origin: Intact Biceps: Intact Triceps: Intact LIGAMENTS Medial stabilizers: Intact Lateral stabilizers:  Intact Cartilage: No chondral defect. Joint: There is a moderate-sized joint effusion with  synovitis and synovial enhancement. Cubital tunnel: Normal. Bones: No fracture or dislocation. No significant marrow abnormality. Soft Tissues: Mild periarticular intramuscular edema signal, likely reactive. IMPRESSION: Moderate-sized elbow joint effusion with synovitis. Joint aspiration should be considered for further evaluation, as infectious/inflammatory/crystalline arthropathy are diagnostic considerations. No evidence of osteomyelitis at this time. Electronically Signed   By: JMaurine SimmeringM.D.   On: 04/22/2021 14:30   MR FOOT RIGHT WO CONTRAST  Result Date: 04/15/2021 CLINICAL DATA:  Foot pain and swelling.  Diabetic. EXAM: MRI OF THE RIGHT FOREFOOT WITHOUT CONTRAST TECHNIQUE: Multiplanar, multisequence MR imaging of the right foot was performed. No intravenous contrast was administered. COMPARISON:  Radiographs 04/14/2021 FINDINGS: Bones/Joint/Cartilage Diffuse subcutaneous soft tissue swelling/edema/fluid mainly along the dorsum of the foot. No obvious discrete abscess. There are masslike areas of soft tissue density on the dorsum of the foot. A large masslike area is emanating between the first and second metatarsal bases and protruding into the subcutaneous tissues and the second area is around the dorsum of the fourth metatarsal. These have relatively low T1 and T2 signal intensity and could be a synovial process or fibrous tissue. Gout would certainly be a consideration. Repeat study with contrast may be helpful for further evaluation. Diffuse edema like signal changes involving the foot musculature suggesting myofasciitis. No obvious findings for pyomyositis. There is a fluid collection surrounding the extensor tendon of the second toe which is likely tenosynovitis but could not exclude septic tenosynovitis. No MR findings suspicious for septic arthritis or osteomyelitis. There are age advanced midfoot degenerative changes with erosive changes. Findings could be due to an erosive arthropathy or early  neuropathic disease. IMPRESSION: 1. Diffuse subcutaneous soft tissue swelling/edema/fluid mainly along the dorsum of the foot suggesting cellulitis. No obvious discrete abscess. 2. Diffuse myofasciitis without definite findings for pyomyositis. 3. No MR findings for septic arthritis or osteomyelitis. 4. Age advanced midfoot degenerative changes with erosive changes. Findings could be due to an erosive arthropathy such as gout or early neuropathic disease. 5. Masslike densities on the dorsum of the foot have low T2 signal intensity suggesting synovial process or fibrous tissue. Tophaceous gout would certainly be a consideration. Repeat study with contrast may be helpful. 6. Fluid collection surrounding the extensor tendon of the second toe which is likely tenosynovitis. Electronically Signed   By: PRicky StabsD.  On: 04/15/2021 08:21   DG CHEST PORT 1 VIEW  Result Date: 04/18/2021 CLINICAL DATA:  Fevers EXAM: PORTABLE CHEST 1 VIEW COMPARISON:  04/14/2021 FINDINGS: Cardiac shadow is stable. Scarring is noted in the lungs bilaterally stable from the prior study. No focal infiltrate or sizable effusion is noted. No bony abnormality is seen. IMPRESSION: No active disease. Electronically Signed   By: Inez Catalina M.D.   On: 04/18/2021 23:50   DG Foot Complete Left  Result Date: 04/14/2021 CLINICAL DATA:  Elevated white blood cell count. EXAM: LEFT FOOT - COMPLETE 3+ VIEW COMPARISON:  None. FINDINGS: Soft tissue swelling along the dorsum of the foot. No acute bony abnormality. Specifically, no fracture, subluxation, or dislocation. No bone destruction to suggest osteomyelitis. IMPRESSION: No acute bony abnormality. Electronically Signed   By: Rolm Baptise M.D.   On: 04/14/2021 17:15   DG Foot Complete Right  Result Date: 04/14/2021 CLINICAL DATA:  Elevated white blood cell count EXAM: RIGHT FOOT COMPLETE - 3+ VIEW COMPARISON:  None. FINDINGS: Soft tissue swelling along the dorsum of the foot. Radiopaque  densities are seen in the dorsal soft tissues of unknown etiology, new since prior study. There is cortical lucency noted along the dorsal surface of the medial kidney a form. Cannot exclude osteomyelitis. No fracture, subluxation or dislocation. IMPRESSION: Lucency of along the dorsal surface of the medial cuneiform on the lateral view concerning for osteomyelitis. This could be further evaluated with MRI. Linear radiopaque densities within the dorsal soft tissues of unknown etiology. This could reflect soft tissue calcifications. Marked dorsal soft tissue swelling. Electronically Signed   By: Rolm Baptise M.D.   On: 04/14/2021 17:18   DG FLUORO GUIDED NEEDLE PLC ASPIRATION/INJECTION LOC  Result Date: 04/22/2021 CLINICAL DATA:  Right elbow pain, aspiration EXAM: RIGHT ELBOW JOINT ASPIRATION UNDER FLUOROSCOPY COMPARISON:  Right elbow MRI 04/22/2021 FLUOROSCOPY TIME:  Fluoroscopy Time:  12 seconds Radiation Exposure Index (if provided by the fluoroscopic device): 0.2 mGy Number of Acquired Spot Images: 0 PROCEDURE: Overlying skin prepped with Betadine and draped in the usual sterile fashion. Local anesthesia was administered utilizing 1% lidocaine. An 18 gauge needle was advanced into the right elbow utilizing a posterior approach through the triceps. Approximately 4 mL of cloudy yellow fluid was aspirated from the elbow joint. The fluid was sent to the laboratory for analysis. The patient tolerated the procedure well. There was no immediate complication. IMPRESSION: Successful right elbow aspiration under fluoroscopy. Approximately 4 mL of cloudy yellow fluid was aspirated from the joint and sent to the laboratory for analysis. No immediate complication. Electronically Signed   By: Maurine Simmering M.D.   On: 04/22/2021 21:12   [place] RT LOWER EXTREM LTD SOFT TISSUE NON VASCULAR  Result Date: 04/16/2021 CLINICAL DATA:  Edema and fluid EXAM: ULTRASOUND right LOWER EXTREMITY LIMITED TECHNIQUE: Ultrasound examination of  the lower extremity soft tissues was performed in the area of clinical concern. COMPARISON:  MRI 04/14/2021 FINDINGS: Targeted sonography of the right foot performed. Marked subcutaneous edema within the right foot and ankle. Hypoechoic mass or fluid collection between the first and second digit measuring 1.8 x 1 x 1.5 cm and between the third and fourth digit measuring 1.4 x 0.9 x 1.1 cm IMPRESSION: Hypoechoic avascular masses versus complex fluid collections between the first and second digits and the third and fourth digits, possibly corresponding to MRI nodules, see previous report. Follow-up contrast enhanced exam could be considered. Electronically Signed   By: Madie Reno.D.  On: 04/16/2021 20:52   VAS [place] UPPER EXTREMITY VENOUS DUPLEX  Result Date: 04/18/2021 UPPER VENOUS STUDY  Patient Name:  CADELL HEYSER  Date of Exam:   04/17/2021 Medical Rec #: [id number]       Accession #:    [id number] Date of Birth: Mar 19, 1976       Patient Gender: M Patient Age:   37 years Exam Location:  [facility] Procedure:      VAS [place] UPPER EXTREMITY VENOUS DUPLEX Referring Phys: Thelma Comp PATEL --------------------------------------------------------------------------------  Indications: Pain, and Edema Risk Factors: Gout. Comparison Study: No prior right UEV on file Performing Technologist: Sharion Dove RVS  Examination Guidelines: A complete evaluation includes B-mode imaging, spectral Doppler, color Doppler, and power Doppler as needed of all accessible portions of each vessel. Bilateral testing is considered an integral part of a complete examination. Limited examinations for reoccurring indications may be performed as noted.  Right Findings: +----------+------------+---------+-----------+----------+---------------+ RIGHT     CompressiblePhasicitySpontaneousProperties    Summary     +----------+------------+---------+-----------+----------+---------------+ IJV           Full       Yes       Yes                               +----------+------------+---------+-----------+----------+---------------+ Subclavian               Yes       Yes                              +----------+------------+---------+-----------+----------+---------------+ Axillary                 Yes       Yes                              +----------+------------+---------+-----------+----------+---------------+ Brachial      Full       Yes       Yes                              +----------+------------+---------+-----------+----------+---------------+ Radial        Full                                                  +----------+------------+---------+-----------+----------+---------------+ Ulnar                                               patent by color +----------+------------+---------+-----------+----------+---------------+ Cephalic      Full                                                  +----------+------------+---------+-----------+----------+---------------+ Basilic       Full                                                  +----------+------------+---------+-----------+----------+---------------+  Left Findings: +----------+------------+---------+-----------+----------+-------+ LEFT      CompressiblePhasicitySpontaneousPropertiesSummary +----------+------------+---------+-----------+----------+-------+ Subclavian               Yes       Yes                      +----------+------------+---------+-----------+----------+-------+  Summary:  Right: No evidence of deep vein thrombosis in the upper extremity. No evidence of superficial vein thrombosis in the upper extremity.  Left: No evidence of thrombosis in the subclavian.  *See table(s) above for measurements and observations.  Diagnosing physician: Harold Barban MD Electronically signed by Harold Barban MD on 04/18/2021 at 3:28:39 PM.    Final     Labs:  Basic Metabolic Panel: Recent Labs  Lab 05/03/21 0541  NA  137  K 4.2  CL 103  CO2 24  GLUCOSE 109*  BUN 51*  CREATININE 1.48*  CALCIUM 9.2  MG 1.5*    CBC: Recent Labs  Lab 05/03/21 0541  WBC 20.4*  NEUTROABS 15.7*  HGB 7.3*  HCT 24.1*  MCV 96.0  PLT 361    CBG: Recent Labs  Lab 05/02/21 1143 05/02/21 1635 05/02/21 2104 05/03/21 0547 05/03/21 1211  GLUCAP 138* 186* 159* 96 167*    Brief HPI:   Bruce Hunter is a 45 y.o. male with history of HTN, T2DM with neuropathy, gout, asthma, recent diagnosis of advanced HIV and Candida esophagitis who was discharged to home on 03/04/2021 but readmitted on 02/22/2021 with acute hypoxemic respiratory failure with hypotension, acute renal failure with hyperkalemia and peripheral edema due to sepsis from bilateral pneumocystitis pneumonia.  He was treated with broad-spectrum antibiotics but went on to develop ARDS requiring intubation and eventually tracheostomy prior to extubation to ATC.  Hospital course significant for acute renal failure due to ATN from sepsis requiring brief CRRT, MSSA/MRSE VAP, encephalopathy, urinary retention, severe anemia as well as severe foot pain felt to be related to gout.  ID following for input and he was started on HRT as well as 12-day course of clindamycin and primaquine as well as prednisone to treat PCP.  He was decannulated and respiratory status was noted to be stable, diet was advanced to regular with thin liquids but continued to be limited by severe bilateral foot pain as well as left shoulder pain.  Therapy was ongoing and working on pregait activity and standing attempts in STEDY.  CIR was recommended due to functional decline.   Hospital Course: Bruce Hunter was admitted to rehab 03/30/2021 for inpatient therapies to consist of PT, ST and OT at least three hours five days a week. Past admission physiatrist, therapy team and rehab RN have worked together to provide customized collaborative inpatient rehab. Primaquin was resumed to continue with  clindamycin for 2 additional days to complete 21 days of treatment and then dapsone was resumed for PCP prevention.  His respiratory status has been stable and prior tracheostomy stoma has closed in nicely. Blood pressures were monitored on TID basis and  Flomax was resumed once Foley discontinued on 08/01 he was noted to be voiding without difficulty.   He was noted to have low magnesium level therefore Mag-Ox was added for supplement.  He refused to have frequent labs done initially.  P.o. intake has been good and double portions offered due to reports of hunger.  His diabetes has been monitored with ac/hs CBG checks and SSI was use prn for tighter BS control.  His blood sugars are relatively  controlled on Lantus 5 units daily and he was advised to continue this regimen at discharge.  Anticipate blood sugars will continue to improve once off steroids and then can be transitioned to oral hypoglycemic sper  PCP input  Due to severe foot pain with dysesthesias on exam at admission, he was started on a course of steroid to help manage gout flare.  As steroids were tapered off he did develop severe/ bilateral foot pain as well as bilateral lower extremity edema despite compression and elevation. On 08/08, Dr. Royce Macadamia (nephrology) was reconsulted due to increasing lower extremity edema with worsening of renal status as well as significant BLE pain.  He was treated with IV Lasix for diuresis  and amlodipine was discontinued due to concerns of contributing to edema.  He also received 2 doses of IV iron for iron deficiency anemia and follow-up CBC shows persistent anemia with some drop in H&H to 7.3 and 24.1.  Recommend follow-up CBC in 5 to 7 days to monitor for stability. His renal status is improved with most recent check of electrolytes showing BUN 39 and serum creatinine 1.31.  Weight is stable at 204.8 pounds.  Nephrology recommended changing Lasix to Monday Wednesday Friday and recommended outpatient follow-up to  monitor renal status.    He has been making progress and set for discharge to home however length of stay was extended due to recurrent issues with significant foot pain.  MRI of foot was done showing diffuse subcutaneous soft tissue swelling with edema along dorsum of foot as well as masslike densities with low T2 signal question synovitic process or tophaceous gout as well as fluid collection of extensor tendon of second toe likely tenosynovitis. Orthopedics was consulted for input and felt that masses were likely tophi due to gout and to treat patient for gout flare.  ID was consulted for input due to question of infection causing pain as well as upward trend in white count.  Dr. Gale Journey felt that symptoms likely due to gout and recommended IR aspiration of subtalar joint to look for joint crystal .   Ultrasound of foot was done for further work-up showing hyperechoic [place] avascular masses versus complex fluid collection between first and second digit and third and fourth digits corresponding to MRI nodules.  IR was consulted to aspirate fluid collection which showed mesothelial cells with abundant WBC, no organisms and fluid culture as well as repeat blood cultures from 08/18 were negative.  He was treated with course of steroids with minimal improvement. He continued to have issues with foot pain affecting with rise in WBC to  25,000 as well as intermittent fevers therefore ID was reconsulted.  CTA chest was negative for infection or PE and  CT abdomen/pelvis was negative for acute changes.  MS Contin was added to help with pain control in addition to titration of gabapentin to 600 mg 3 times daily as AKI had resolved.  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    He is on a slow weekly taper of prednisone to 30 mg a day and recommend follow-up with PCP for evaluation and further taper.  ID recommended no further work-up  unless fevers greater than 101.5 and he has been set to follow-up with ID as well as PCP later this week. He continues on Klonopin 3 times daily for anxiety management.  Robaxin was added to help with right ankle pain felt to  be due to muscle spasm.  He has also been educated on judicious use of oxycodone and was given a pain relief journal to use at home.  He has been educated extensively about importance of medication compliance as well as keeping his follow-up appointments.  SNF was discussed for transition prior to d/c to home however he declined this.   He is currently modified independent at wheelchair level and has been referred to [facility] neuro outpatient rehab for further therapy.   Rehab course: During patient's stay in rehab weekly team conferences were held to monitor patient's progress, set goals and discuss barriers to discharge. At admission, patient required max assist with basic ADL tasks and with mobility. 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  He has had improvement in activity tolerance, balance, postural control as well as ability to compensate for deficits. ss He is able to complete ADL tasks from wheelchair level at modified independent level.  He is able to perform lateral scoot transfers independently and ambulate short distances with a rolling walker depending on pain symptomatology.    Discharge disposition: 01-Home or Self Care  Diet: Low salt/CarbModified  Special Instructions: Continue to wear the support stockings or Ace wrap on BLE.  Keep legs elevated while seated on bed. Monitor blood sugars 2-3 times a day and follow-up with PCP for further adjustments. Recommend repeat CBC and BMET a week to monitor H&H and renal status.   Allergies as of 05/03/2021       Reactions   Lactose Intolerance (gi)         Medication List     STOP taking these medications    Accu-Chek Guide  test strip Generic drug: glucose blood   Accu-Chek Softclix Lancets lancets   amLODipine 10 MG tablet Commonly known as: NORVASC   clindamycin 900 MG/50ML IVPB Commonly known as: CLEOCIN   folic acid 1 MG tablet Commonly known as: FOLVITE   hydrocortisone 2.5 % cream   lisinopril 20 MG tablet Commonly known as: ZESTRIL   omeprazole 20 MG capsule Commonly known as: PRILOSEC   primaquine 26.3 (15 Base) MG tablet   zolpidem 5 MG tablet Commonly known as: AMBIEN       TAKE these medications    acetaminophen 325 MG tablet Commonly known as: TYLENOL Take 1 tablet (325 mg total) by mouth every 4 (four) hours as needed for mild pain.   clonazePAM 0.5 MG tablet--Rx# 90 pills Commonly known as: KLONOPIN Take 1 tablet (0.5 mg total) by mouth 3 (three) times daily. What changed:  when to take this reasons to take this   cyanocobalamin 1000 MCG tablet Take 1 tablet (1,000 mcg total) by mouth daily.   dapsone 100 MG tablet Take 1 tablet (100 mg total) by mouth daily. Restart on 04/03/2021   diclofenac Sodium 1 % Gel Commonly known as: VOLTAREN Apply 2 g topically 4 (four) times daily.   Dovato 50-300 MG Tabs Generic drug: Dolutegravir-lamiVUDine Take 1 tablet by mouth daily. What changed:  medication strength how much to take   furosemide 40 MG tablet Commonly known as: LASIX Take 1 tablet (40 mg total) by mouth every Monday, Wednesday, and Friday.   gabapentin 300 MG capsule Commonly known as: NEURONTIN Take 2 capsules (600 mg total) by mouth 3 (three) times daily. What changed: how much to take   hydrALAZINE 25 MG tablet Commonly known as: APRESOLINE Take 1 tablet (  25 mg total) by mouth every 8 (eight) hours.   lidocaine 5 % Commonly known as: LIDODERM Place 2 patches onto the skin daily. Remove & Discard patch within 12 hours or as directed by MD Notes to patient: Apply to feet at 8 am and remove at 8 pm daily--can purchase over the counter.     magnesium oxide 400 (240 Mg) MG tablet Commonly known as: MAG-OX Take 1 tablet (400 mg total) by mouth 2 (two) times daily.   methocarbamol 500 MG tablet Commonly known as: ROBAXIN Take 1 tablet (500 mg total) by mouth every 6 (six) hours as needed for muscle spasms.   metoprolol tartrate 50 MG tablet Commonly known as: LOPRESSOR Take 1 tablet (50 mg total) by mouth 2 (two) times daily.   morphine 15 MG 12 hr tablet--Rx# 10 pills Commonly known as: MS CONTIN Take 1 tablet (15 mg total) by mouth every 12 (twelve) hours.   multivitamin with minerals Tabs tablet Take 1 tablet by mouth daily.   Oxycodone HCl 10 MG Tabs--Rx# 25 pills Take 1 tablet (10 mg total) by mouth every 4 (four) hours as needed. MAX 5 TABS PER DAY AS NEEDED FOR SEVERE PAIN What changed:  medication strength how much to take when to take this reasons to take this additional instructions   Pen Needles 31G X 6 MM Misc 1 application by Does not apply route daily.   polyethylene glycol 17 g packet Commonly known as: MIRALAX / GLYCOLAX Take 17 g by mouth daily as needed for moderate constipation.   predniSONE 10 MG tablet Commonly known as: DELTASONE Take 3 tablets (30 mg total) by mouth daily with breakfast. Start taking on: May 04, 2021 What changed:  medication strength how much to take additional instructions   Semglee (yfgn) 100 UNIT/ML Solostar Pen Generic drug: insulin glargine Inject 5 Units into the skin daily.   tamsulosin 0.4 MG Caps capsule Commonly known as: FLOMAX Take 1 capsule (0.4 mg total) by mouth daily after supper.        Follow-up Information     Elwyn Reach, MD Follow up on 05/06/2021.   Specialty: Internal Medicine Why: Appointment @ 4:30 PM Contact information: [address] [place] [address] [phone]         Claudia Desanctis, MD Follow up.   Specialty: Nephrology Why: call for appointment Contact information: [place]  [address] [phone]         Tommy Medal, Lavell Islam, MD Follow up on 05/07/2021.   Specialty: Infectious Diseases Why: APPOINTMENT @ 9:00 ARIVE 15 MINUTES BEFORE APPOINTMENT Contact information: [address] [place] [address] [phone]         Izora Ribas, MD Follow up.   Specialty: Physical Medicine and Rehabilitation Why: 06/02/21 please arrive at 1:40pm for 2:00pm appointment, thank you! Contact information: [id number] [place] [address] [phone]                 Signed: Bary Leriche 05/05/2021, 6:40 PM

## 2021-05-06 ENCOUNTER — Telehealth: Payer: Self-pay | Admitting: Internal Medicine

## 2021-05-06 NOTE — Telephone Encounter (Signed)
   GILLIAM SULLIVANT DOB: 27-Dec-1975 MRN: [id number]   RIDER WAIVER AND RELEASE OF LIABILITY  For purposes of improving physical access to our facilities, [facility] is pleased to partner with third parties to provide [facility] patients or other authorized individuals the option of convenient, on-demand ground transportation services (the Technical brewer") through use of the technology service that enables users to request on-demand ground transportation from independent third-party providers.  By opting to use and accept these [employer], I, the undersigned, hereby agree on behalf of myself, and on behalf of any minor child using the Government social research officer for whom I am the parent or legal guardian, as follows:  Government social research officer provided to me are provided by independent third-party transportation providers who are not [employer] or employees and who are unaffiliated with [employer]. [facility] is neither a transportation carrier nor a common or public carrier. [facility] has no control over the quality or safety of the transportation that occurs as a result of the [employer]. [facility] cannot guarantee that any third-party transportation provider will complete any arranged transportation service. [facility] makes no representation, warranty, or guarantee regarding the reliability, timeliness, quality, safety, suitability, or availability of any of the Transport Services or that they will be error free. I fully understand that traveling by vehicle involves risks and dangers of serious bodily injury, including permanent disability, paralysis, and death. I agree, on behalf of myself and on behalf of any minor child using the Transport Services for whom I am the parent or legal guardian, that the entire risk arising out of my use of the [employer] remains solely with me, to the maximum extent permitted under applicable law. The [employer] are provided "as  is" and "as available." [facility] disclaims all representations and warranties, express, implied or statutory, not expressly set out in these terms, including the implied warranties of merchantability and fitness for a particular purpose. I hereby waive and release [facility], its agents, employees, officers, directors, representatives, insurers, attorneys, assigns, successors, subsidiaries, and affiliates from any and all past, present, or future claims, demands, liabilities, actions, causes of action, or suits of any kind directly or indirectly arising from acceptance and use of the [employer]. I further waive and release [facility] and its affiliates from all present and future liability and responsibility for any injury or death to persons or damages to property caused by or related to the use of the [employer]. I have read this Waiver and Release of Liability, and I understand the terms used in it and their legal significance. This Waiver is freely and voluntarily given with the understanding that my right (as well as the right of any minor child for whom I am the parent or legal guardian using the [employer]) to legal recourse against [facility] in connection with the [employer] is knowingly surrendered in return for use of these services.   I attest that I read the consent document to [name], gave Mr. Schrauth the opportunity to ask questions and answered the questions asked (if any). I affirm that [name] then provided consent for he's participation in this program.     Katy Apo

## 2021-05-07 ENCOUNTER — Other Ambulatory Visit (HOSPITAL_COMMUNITY): Payer: Self-pay

## 2021-05-07 ENCOUNTER — Encounter: Payer: Self-pay | Admitting: Infectious Disease

## 2021-05-07 ENCOUNTER — Other Ambulatory Visit: Payer: Self-pay

## 2021-05-07 ENCOUNTER — Ambulatory Visit (INDEPENDENT_AMBULATORY_CARE_PROVIDER_SITE_OTHER): Payer: Medicaid Other | Admitting: Infectious Disease

## 2021-05-07 VITALS — BP 135/83 | HR 103

## 2021-05-07 DIAGNOSIS — E1142 Type 2 diabetes mellitus with diabetic polyneuropathy: Secondary | ICD-10-CM

## 2021-05-07 DIAGNOSIS — Z930 Tracheostomy status: Secondary | ICD-10-CM | POA: Diagnosis not present

## 2021-05-07 DIAGNOSIS — F419 Anxiety disorder, unspecified: Secondary | ICD-10-CM

## 2021-05-07 DIAGNOSIS — M25571 Pain in right ankle and joints of right foot: Secondary | ICD-10-CM | POA: Diagnosis not present

## 2021-05-07 DIAGNOSIS — Z23 Encounter for immunization: Secondary | ICD-10-CM | POA: Diagnosis not present

## 2021-05-07 DIAGNOSIS — G7281 Critical illness myopathy: Secondary | ICD-10-CM

## 2021-05-07 DIAGNOSIS — R6520 Severe sepsis without septic shock: Secondary | ICD-10-CM

## 2021-05-07 DIAGNOSIS — M109 Gout, unspecified: Secondary | ICD-10-CM

## 2021-05-07 DIAGNOSIS — I10 Essential (primary) hypertension: Secondary | ICD-10-CM

## 2021-05-07 DIAGNOSIS — A419 Sepsis, unspecified organism: Secondary | ICD-10-CM | POA: Diagnosis not present

## 2021-05-07 DIAGNOSIS — R6521 Severe sepsis with septic shock: Secondary | ICD-10-CM

## 2021-05-07 DIAGNOSIS — N179 Acute kidney failure, unspecified: Secondary | ICD-10-CM

## 2021-05-07 DIAGNOSIS — J189 Pneumonia, unspecified organism: Secondary | ICD-10-CM | POA: Diagnosis not present

## 2021-05-07 DIAGNOSIS — M25572 Pain in left ankle and joints of left foot: Secondary | ICD-10-CM | POA: Diagnosis not present

## 2021-05-07 DIAGNOSIS — B20 Human immunodeficiency virus [HIV] disease: Secondary | ICD-10-CM | POA: Diagnosis present

## 2021-05-07 DIAGNOSIS — B59 Pneumocystosis: Secondary | ICD-10-CM

## 2021-05-07 DIAGNOSIS — M7989 Other specified soft tissue disorders: Secondary | ICD-10-CM | POA: Diagnosis not present

## 2021-05-07 HISTORY — DX: Anxiety disorder, unspecified: F41.9

## 2021-05-07 MED ORDER — DAPSONE 100 MG PO TABS
100.0000 mg | ORAL_TABLET | Freq: Every day | ORAL | 11 refills | Status: DC
  Filled 2021-05-07 – 2021-05-11 (×2): qty 30, 30d supply, fill #0

## 2021-05-07 MED ORDER — DOVATO 50-300 MG PO TABS
1.0000 | ORAL_TABLET | Freq: Every day | ORAL | 11 refills | Status: DC
  Filled 2021-05-07 – 2021-05-11 (×2): qty 30, 30d supply, fill #0
  Filled 2021-06-02: qty 30, 30d supply, fill #1
  Filled 2021-08-16 (×2): qty 30, 30d supply, fill #2
  Filled 2021-09-13: qty 30, 30d supply, fill #3
  Filled 2021-10-05: qty 30, 30d supply, fill #4
  Filled 2021-10-20: qty 30, 30d supply, fill #5

## 2021-05-07 NOTE — Progress Notes (Signed)
Subjective:  Chief complaint still having trouble with bilateral lower extremity edema and also still having intermittent fevers  Patient ID: Bruce Hunter, male    DOB: 19-Feb-1976, 45 y.o.   MRN: [id number]  HPI  Bruce Hunter is a 45 year old black man who was recently diagnosed with HIV and AIDS.  He recently had a retracted hospital stay from March 30, 2021 till the very end of August 2022.  Admitted with respiratory failure that required mechanical intubation and ventilation and ultimately underwent tracheostomy.  He completed a treatment course for PCP pneumonia though PCP was never proven he did have evidence of deteriorating few days during his stay at [facility] when he was off anti-PCP medications.  Developed septic physiology with shock and acute renal failure requiring CVVHD briefly.  Urgently was able to come off the ventilator and his renal function recovered completely.  His hospital course was further complicated by diffuse gout that required a corticosteroid taper.  He also was diagnosed with diabetes mellitus while in the hospital and now is on insulin.  He ultimately is discharged in the inpatient unit to inpatient rehab where he was further taken care of.  My partners Dr. Gale Journey and Baxter Flattery saw him again when he had recurrent fevers.  F AFB cultures were sent as well as testing for gonorrhea chlamydia CT chest abdomen pelvis.    He was started back on corticosteroids with resolution of his fevers.  He now finish steroids for gout and is having intermittent fevers again at times.  I suspect the fevers are due to IRIS.  He also has LE edema for which he is taking lasix.  He is still too weak to walk about and is as wheelchair-bound.  His wheelchair also is not functioning optimally with the left side forcing him to keep his leg in a more elevated position because does not go back into position properly anymore.    Past Medical History:  Diagnosis Date   Asthma    Diabetes  mellitus without complication ([place])    Gout    Hyperlipidemia    Hypertension    Obesity     Past Surgical History:  Procedure Laterality Date   BIOPSY  02/15/2021   Procedure: BIOPSY;  Surgeon: Ronnette Juniper, MD;  Location: [facility];  Service: Gastroenterology;;   COLONOSCOPY WITH PROPOFOL N/A 02/15/2021   Procedure: COLONOSCOPY WITH PROPOFOL;  Surgeon: Ronnette Juniper, MD;  Location: [facility];  Service: Gastroenterology;  Laterality: N/A;   ESOPHAGOGASTRODUODENOSCOPY (EGD) WITH PROPOFOL N/A 02/15/2021   Procedure: ESOPHAGOGASTRODUODENOSCOPY (EGD) WITH PROPOFOL;  Surgeon: Ronnette Juniper, MD;  Location: [facility];  Service: Gastroenterology;  Laterality: N/A;   GANGLION CYST EXCISION Right 02/26/2020   Procedure: EXCISION TOPHUS RIGHT FOOT;  Surgeon: Evelina Bucy, DPM;  Location: [facility];  Service: Podiatry;  Laterality: Right;   NO PAST SURGERIES      Family History  Problem Relation Age of Onset   Asthma Mother    Cancer Mother    Diabetes Mother    Obesity Mother    Obesity Sister    Diabetes Brother    Obesity Brother    Cancer Maternal Grandmother    Diabetes Maternal Grandmother    Diabetes Other       Social History   Socioeconomic History   Marital status: Single    Spouse name: Not on file   Number of children: Not on file   Years of education: Not on file   Highest  education level: Not on file  Occupational History   Not on file  Tobacco Use   Smoking status: Every Day    Packs/day: 0.25    Types: Cigarettes   Smokeless tobacco: Never  Substance and Sexual Activity   Alcohol use: Yes   Drug use: Yes    Frequency: 1.0 times per week    Types: Marijuana    Comment: marijuana at midnight last night; daily   Sexual activity: Yes  Other Topics Concern   Not on file  Social History Narrative   Not on file   Social Determinants of Health   Financial Resource Strain: Not on file  Food Insecurity: Not on file  Transportation  Needs: Not on file  Physical Activity: Not on file  Stress: Not on file  Social Connections: Not on file    Allergies  Allergen Reactions   Lactose Intolerance (Gi)      Current Outpatient Medications:    acetaminophen (TYLENOL) 325 MG tablet, Take 1 tablet (325 mg total) by mouth every 4 (four) hours as needed for mild pain., Disp: 100 tablet, Rfl: 0   clonazePAM (KLONOPIN) 0.5 MG tablet, Take 1 tablet (0.5 mg total) by mouth 3 (three) times daily., Disp: 90 tablet, Rfl: 0   cyanocobalamin 1000 MCG tablet, Take 1 tablet (1,000 mcg total) by mouth daily., Disp: 100 tablet, Rfl: 2   dapsone 100 MG tablet, Take 1 tablet (100 mg total) by mouth daily. Restart on 04/03/2021, Disp: 30 tablet, Rfl: 0   diclofenac Sodium (VOLTAREN) 1 % GEL, Apply 2 g topically 4 (four) times daily., Disp: 300 g, Rfl: 0   Dolutegravir-lamiVUDine (DOVATO) 50-300 MG TABS, Take 1 tablet by mouth daily., Disp: 30 tablet, Rfl: 0   furosemide (LASIX) 40 MG tablet, Take 1 tablet (40 mg total) by mouth every Monday, Wednesday, and Friday., Disp: 15 tablet, Rfl: 0   gabapentin (NEURONTIN) 300 MG capsule, Take 2 capsules (600 mg total) by mouth 3 (three) times daily., Disp: 180 capsule, Rfl: 0   hydrALAZINE (APRESOLINE) 25 MG tablet, Take 1 tablet (25 mg total) by mouth every 8 (eight) hours., Disp: 90 tablet, Rfl: 0   insulin glargine (LANTUS SOLOSTAR) 100 UNIT/ML Solostar Pen, Inject 5 Units into the skin daily., Disp: 15 mL, Rfl: 0   Insulin Pen Needle 32G X 4 MM MISC, use as directed, Disp: 100 each, Rfl: 0   lidocaine (LIDODERM) 5 %, Place 2 patches onto the skin daily. Remove & Discard patch within 12 hours or as directed by MD, Disp: 30 patch, Rfl: 0   magnesium oxide (MAG-OX) 400 MG tablet, Take 1 tablet (400 mg total) by mouth 2 (two) times daily., Disp: 60 tablet, Rfl: 0   methocarbamol (ROBAXIN) 500 MG tablet, Take 1 tablet (500 mg total) by mouth every 6 (six) hours as needed for muscle spasms., Disp: 120 tablet,  Rfl: 0   metoprolol tartrate (LOPRESSOR) 50 MG tablet, Take 1 tablet (50 mg total) by mouth 2 (two) times daily., Disp: 60 tablet, Rfl: 0   morphine (MS CONTIN) 15 MG 12 hr tablet, Take 1 tablet (15 mg total) by mouth every 12 (twelve) hours., Disp: 10 tablet, Rfl: 0   Multiple Vitamin (MULTIVITAMIN WITH MINERALS) TABS tablet, Take 1 tablet by mouth daily., Disp: , Rfl:    Oxycodone HCl 10 MG TABS, Take 1 tablet (10 mg total) by mouth every 4 (four) hours as needed. MAX 5 TABS PER DAY AS NEEDED FOR SEVERE PAIN, Disp: 25  tablet, Rfl: 0   polyethylene glycol (MIRALAX / GLYCOLAX) 17 g packet, Take 17 g by mouth daily as needed for moderate constipation., Disp: , Rfl:    predniSONE (DELTASONE) 10 MG tablet, Take 3 tablets (30 mg total) by mouth daily with breakfast., Disp: 90 tablet, Rfl: 0   tamsulosin (FLOMAX) 0.4 MG CAPS capsule, Take 1 capsule (0.4 mg total) by mouth daily after supper., Disp: 30 capsule, Rfl: 0   Review of Systems  Constitutional:  Positive for fatigue and fever. Negative for activity change, appetite change, chills, diaphoresis and unexpected weight change.  HENT:  Negative for congestion, rhinorrhea, sinus pressure, sneezing, sore throat and trouble swallowing.   Eyes:  Negative for photophobia and visual disturbance.  Respiratory:  Negative for cough, chest tightness, shortness of breath, wheezing and stridor.   Cardiovascular:  Positive for leg swelling. Negative for chest pain and palpitations.  Gastrointestinal:  Negative for abdominal distention, abdominal pain, anal bleeding, blood in stool, constipation, diarrhea, nausea and vomiting.  Genitourinary:  Negative for difficulty urinating, dysuria, flank pain and hematuria.  Musculoskeletal:  Negative for arthralgias, back pain, gait problem, joint swelling and myalgias.  Skin:  Negative for color change, pallor, rash and wound.  Neurological:  Negative for dizziness, tremors, weakness and light-headedness.  Hematological:   Negative for adenopathy. Does not bruise/bleed easily.  Psychiatric/Behavioral:  Negative for agitation, behavioral problems, confusion, decreased concentration, dysphoric mood and sleep disturbance. The patient is nervous/anxious.       Objective:   Physical Exam Constitutional:      Appearance: He is well-developed.  HENT:     Head: Normocephalic and atraumatic.  Eyes:     Conjunctiva/sclera: Conjunctivae normal.  Cardiovascular:     Rate and Rhythm: Normal rate and regular rhythm.     Heart sounds: No murmur heard.   No friction rub. No gallop.  Pulmonary:     Effort: Pulmonary effort is normal. No respiratory distress.     Breath sounds: No stridor. No wheezing or rhonchi.  Abdominal:     General: There is no distension.     Palpations: Abdomen is soft.  Musculoskeletal:        General: No tenderness. Normal range of motion.     Cervical back: Normal range of motion and neck supple.     Right lower leg: Edema present.     Left lower leg: Edema present.  Skin:    General: Skin is warm.     Coloration: Skin is not pale.     Findings: No erythema or rash.  Neurological:     General: No focal deficit present.     Mental Status: He is alert and oriented to person, place, and time.     Motor: Weakness present.  Psychiatric:        Mood and Affect: Mood normal.        Behavior: Behavior normal.        Thought Content: Thought content normal.        Judgment: Judgment normal.          Assessment & Plan:   HIV disease  I am checking HIV viral load and a CD4 count today.  I am continue his Dovato.  Fevers: I suspect again that these are due to IRIS he was worked up extensively  I have reviewed his most recent culture from joint on 04/26/2021 no growth aspirate from the 18th also with no growth with 6008 and 20 white cells and turbid parents  with her cellular monosodium urate crystals  Also reviewed CT chest abdomen pelvis performed on August 24 which shows no  evidence of pulmonary embolism some streaky airspace opacity consistent atelectasis some diverticulosis and gallstones  DM: he is on insulin and is following with Dr Jonelle Sidle  HTN is on metoprolol also getting diuresis with Lasix.  Vitals:   05/07/21 0919  BP: 135/83  Pulse: (!) 103  SpO2: 95%    PTSD anxiety: Taking clonazepam.  Deconditioning we will follow-up with rehab  Gout: He is finished a corticosteroid taper would benefit from colchicine and later allopurinol will defer to PCP  I spent 36 minutes with the patient including face to face counseling of the patient on his HIV disease and how he needs to be highly adherent to his medications which he appears to be doing, differential for his fevers, personally reviewing his chest abdomen pelvis his microbiological data as outlined above along review of medical records before and during the visit and in coordination of his care.

## 2021-05-10 LAB — HIV-1 RNA QUANT-NO REFLEX-BLD
HIV 1 RNA Quant: 664 Copies/mL — ABNORMAL HIGH
HIV-1 RNA Quant, Log: 2.82 Log cps/mL — ABNORMAL HIGH

## 2021-05-10 LAB — T-HELPER CELLS (CD4) COUNT (NOT AT ARMC)
Absolute CD4: 638 cells/uL (ref 490–1740)
CD4 T Helper %: 32 % (ref 30–61)
Total lymphocyte count: 1998 cells/uL (ref 850–3900)

## 2021-05-11 ENCOUNTER — Other Ambulatory Visit: Payer: Self-pay

## 2021-05-11 ENCOUNTER — Ambulatory Visit: Payer: Medicaid Other | Admitting: Rehabilitation

## 2021-05-11 ENCOUNTER — Encounter: Payer: Self-pay | Admitting: Rehabilitation

## 2021-05-11 ENCOUNTER — Other Ambulatory Visit (HOSPITAL_COMMUNITY): Payer: Self-pay

## 2021-05-11 ENCOUNTER — Telehealth: Payer: Self-pay

## 2021-05-11 DIAGNOSIS — M25571 Pain in right ankle and joints of right foot: Secondary | ICD-10-CM | POA: Diagnosis present

## 2021-05-11 DIAGNOSIS — M25572 Pain in left ankle and joints of left foot: Secondary | ICD-10-CM | POA: Diagnosis present

## 2021-05-11 DIAGNOSIS — F419 Anxiety disorder, unspecified: Secondary | ICD-10-CM | POA: Diagnosis present

## 2021-05-11 DIAGNOSIS — G7281 Critical illness myopathy: Secondary | ICD-10-CM | POA: Diagnosis present

## 2021-05-11 DIAGNOSIS — M6281 Muscle weakness (generalized): Secondary | ICD-10-CM

## 2021-05-11 DIAGNOSIS — R2681 Unsteadiness on feet: Secondary | ICD-10-CM

## 2021-05-11 DIAGNOSIS — R2689 Other abnormalities of gait and mobility: Secondary | ICD-10-CM | POA: Insufficient documentation

## 2021-05-11 DIAGNOSIS — M79671 Pain in right foot: Secondary | ICD-10-CM

## 2021-05-11 DIAGNOSIS — M79672 Pain in left foot: Secondary | ICD-10-CM | POA: Insufficient documentation

## 2021-05-11 DIAGNOSIS — R5381 Other malaise: Secondary | ICD-10-CM | POA: Diagnosis present

## 2021-05-11 DIAGNOSIS — M7989 Other specified soft tissue disorders: Secondary | ICD-10-CM | POA: Diagnosis present

## 2021-05-11 NOTE — Addendum Note (Signed)
Addended by: Cameron Sprang A on: 05/11/2021 03:36 PM   Modules accepted: Orders

## 2021-05-11 NOTE — Telephone Encounter (Signed)
-----   Message from Truman Hayward, MD sent at 05/11/2021 11:47 AM EDT ----- Bruce Hunter should be better suppressed. Can we make sure he is being religious with his Dovato and I want to make sureI see him one month from this lab value ----- Message ----- From: Interface, [facility] Lab Results In Sent: 05/10/2021   1:12 PM EDT To: Truman Hayward, MD

## 2021-05-11 NOTE — Therapy (Signed)
[facility] [address] [place], [place], [address] Phone: [phone]   Fax:  [phone]  Physical Therapy Evaluation  Patient Details  Name: Bruce Hunter MRN: [id number] Date of Birth: 05-Dec-1975 Referring Provider (PT): Courtney Heys, MD   Encounter Date: 05/11/2021   PT End of Session - 05/11/21 1333     Visit Number 1    Number of Visits 13    Authorization Type [employer]    PT Start Time [id number]    PT Stop Time [id number]    PT Time Calculation (min) 44 min    Activity Tolerance Patient limited by pain    Behavior During Therapy [facility] for tasks assessed/performed             Past Medical History:  Diagnosis Date   Anxiety 05/07/2021   Asthma    Diabetes mellitus without complication ([place])    Gout    Hyperlipidemia    Hypertension    Obesity     Past Surgical History:  Procedure Laterality Date   BIOPSY  02/15/2021   Procedure: BIOPSY;  Surgeon: Ronnette Juniper, MD;  Location: [facility];  Service: Gastroenterology;;   COLONOSCOPY WITH PROPOFOL N/A 02/15/2021   Procedure: COLONOSCOPY WITH PROPOFOL;  Surgeon: Ronnette Juniper, MD;  Location: [facility];  Service: Gastroenterology;  Laterality: N/A;   ESOPHAGOGASTRODUODENOSCOPY (EGD) WITH PROPOFOL N/A 02/15/2021   Procedure: ESOPHAGOGASTRODUODENOSCOPY (EGD) WITH PROPOFOL;  Surgeon: Ronnette Juniper, MD;  Location: [facility];  Service: Gastroenterology;  Laterality: N/A;   GANGLION CYST EXCISION Right 02/26/2020   Procedure: EXCISION TOPHUS RIGHT FOOT;  Surgeon: Evelina Bucy, DPM;  Location: [facility];  Service: Podiatry;  Laterality: Right;   NO PAST SURGERIES      There were no vitals filed for this visit.    Subjective Assessment - 05/11/21 1235     Subjective "I can't walk."  I was able to walk a little in the hospital but now my feet hurt so bad I can only stand.    Pertinent History Hx of AIDS, DMII, gout, ICU myopathy     Limitations Sitting;Standing;Walking;House hold activities    How long can you stand comfortably? maybe one minute    How long can you walk comfortably? Isn't walking    Patient Stated Goals "I wanna walk.  I want to be able to return to golf."    Currently in Pain? Yes    Pain Score 10-Worst pain ever    Pain Location Foot    Pain Orientation Left;Right    Pain Descriptors / Indicators Aching;Burning;Throbbing    Pain Type Chronic pain    Pain Radiating Towards up to ankles    Pain Onset More than a month ago    Pain Frequency Constant    Aggravating Factors  standing, not taking medicine on time, placing foot on ground wrong    Pain Relieving Factors medication                [facility] PT Assessment - 05/11/21 1242       Assessment   Medical Diagnosis Debility    Referring Provider (PT) Courtney Heys, MD    Onset Date/Surgical Date 02/01/21   was then readmitted 02/22/21   Hand Dominance Right    Prior Therapy acute, IP rehab      Precautions   Precautions Fall    Precaution Comments increased pain in B feet, swelling      Balance Screen  Has the patient fallen in the past 6 months No    Has the patient had a decrease in activity level because of a fear of falling?  Yes    Is the patient reluctant to leave their home because of a fear of falling?  Yes      [place] residence    Living Arrangements Alone    Available Help at Discharge Family   mom helps intermittently   Type of [place] to enter    Entrance Stairs-Number of Steps 2    Entrance Stairs-Rails None   is getting ramp installed   [place] Bedside commode;Wheelchair - manual      Prior Function   Level of Independence Independent with basic ADLs    Vocation On disability    Leisure golf, fishing (from all places)      Cognition   Overall Cognitive Status Within Functional Limits for tasks assessed       Observation/Other Assessments   Observations Pitting edema in BLEs, has TED hose to help with this.      Sensation   Light Touch Impaired Detail    Light Touch Impaired Details Impaired RLE;Impaired LLE;Impaired LUE    Hot/Cold Appears Intact      Coordination   Gross Motor Movements are Fluid and Coordinated Yes    Fine Motor Movements are Fluid and Coordinated Yes      ROM / Strength   AROM / PROM / Strength Strength      Strength   Overall Strength Deficits    Overall Strength Comments B LE: hip flex 3/5 (not full range but due to body habitus), hip abd 2+/5, knee ext 4/5, knee flex 4/5, ankle DF 3+/5      Bed Mobility   Rolling Right Independent    Rolling Left Independent      Transfers   Transfers Sit to Stand;Stand to Sit    Sit to Stand 5: Supervision    Sit to Stand Details Verbal cues for sequencing;Verbal cues for technique    Sit to Stand Details (indicate cue type and reason) Cues for safe hand placement    Stand to Sit 4: Min assist    Stand to Sit Details (indicate cue type and reason) Verbal cues for sequencing;Verbal cues for technique;Verbal cues for safe use of DME/AE    Stand to Sit Details Cues and assist for controlled descent      Ambulation/Gait   Ambulation/Gait Yes    Ambulation/Gait Assistance 4: Min assist    Ambulation/Gait Assistance Details Min A with wide RW with cues for upright posture, positioning inside of RW, and to increase B DF for improved clearance.  Pt needs continued encouragement during gait to continue.    Ambulation Distance (Feet) 85 Feet   then another 20'   Assistive device Rolling walker   wide RW   Gait Pattern Step-through pattern;Decreased stride length;Decreased hip/knee flexion - right;Decreased dorsiflexion - left;Decreased hip/knee flexion - left;Decreased dorsiflexion - right;Trunk flexed    Ambulation Surface Level;Indoor    Gait velocity 0.61 ft/sec with wide RW and min A    Stairs No      Standardized Balance  Assessment   Standardized Balance Assessment Berg Balance Test      Berg Balance Test   Sit to Stand Able to stand  independently using hands    Standing Unsupported Able  to stand 30 seconds unsupported    Sitting with Back Unsupported but Feet Supported on Floor or Stool Able to sit safely and securely 2 minutes    Stand to Sit Controls descent by using hands    Transfers Able to transfer with verbal cueing and /or supervision    Standing Unsupported with Eyes Closed Able to stand 10 seconds with supervision    Standing Unsupported with Feet Together Needs help to attain position but able to stand for 30 seconds with feet together    From Standing, Reach Forward with Outstretched Arm Reaches forward but needs supervision    From Standing Position, Turn to Look Behind Over each Shoulder Needs supervision when turning    Turn 360 Degrees Needs assistance while turning    Standing Unsupported, Alternately Place Feet on Step/Stool Needs assistance to keep from falling or unable to try                        Objective measurements completed on examination: See above findings.               PT Education - 05/11/21 1333     Education Details educated on evaluation findings, POC, goals    Person(s) Educated Patient    Methods Explanation    Comprehension Verbalized understanding              PT Short Term Goals - 05/11/21 1342       PT SHORT TERM GOAL #1   Title Pt will be independent with initial HEP in order to indicate improved balance and functional mobility. (Target: 06/01/21)    Baseline dependent    Time 3    Period Weeks    Status New    Target Date 06/01/21      PT SHORT TERM GOAL #2   Title Pt will improve BERG balance score by 4 points from baseline in order to indicate decreased fall risk.    Baseline unable to finish on day of eval 2/2 time constraint    Time 6    Period Weeks    Status New      PT SHORT TERM GOAL #3   Title Pt will  perform TUG and goal to be updated to reflect progress and dec fall risk.    Baseline unable to perform on day of eval    Time 6    Period Weeks    Status New      PT SHORT TERM GOAL #4   Title Pt will ambulate x 100' nonstop with RW at S level in order to indicate improved independence with household mobility.    Baseline 85' at min A with RW    Time 3    Period Weeks    Status New               PT Long Term Goals - 05/11/21 1347       PT LONG TERM GOAL #1   Title Pt will be independent with final HEP in order to indicate improved balance and functional mobility (Target:    Baseline dependent    Time 6    Period Weeks    Status New    Target Date 06/25/21      PT LONG TERM GOAL #2   Title Pt will improve BERG balance score by 8 points from baseline in order to indicate dec fall risk.    Baseline unable to finish due to  time constraint    Time 6    Period Weeks    Status New      PT LONG TERM GOAL #3   Title Pt will ambulate 250' nonstop with RW at S level over indoor and paved outdoor surfaces in order to indicate improved limited community mobility.    Baseline 85' indoors with RW at min A    Time 6    Period Weeks    Status New      PT LONG TERM GOAL #4   Title LTG to be written for TUG once assessed in order to relfect dec fall risk.    Baseline unable to assess on eval    Time 6    Period Weeks    Status New      PT LONG TERM GOAL #5   Title Pt will go up/down 2 small steps with LRAD at S level in order to indicate safe home entry/exit.    Baseline did not have time to assess on eval    Time 6    Period Weeks    Status New                    Plan - 05/11/21 1334     Clinical Impression Statement Pt is 45 y.o. male with history of HTN, T2DM with neuropathy, gout, asthma, recent diagnosis of advanced HIV and Candida esophagitis who was discharged to home on 02/01/2021 but readmitted on 02/22/2021 with acute hypoxemic respiratory failure with  hypotension, acute renal failure with hyperkalemia and peripheral edema due to sepsis from bilateral pneumocystitis pneumonia.  He was treated with broad-spectrum antibiotics but went on to develop ARDS requiring intubation and eventually tracheostomy prior to extubation to ATC.  Hospital course significant for acute renal failure due to ATN from sepsis requiring brief CRRT, MSSA/MRSE VAP, encephalopathy, urinary retention, severe anemia as well as severe foot pain felt to be related to gout.  Pt presents with biggest limitation being B ankle and foot pain, but also with generalized weakness, decreased balance and endurance.  Gait speed of 0.61 ft/sec indicative of high fall risk, was unable to finish the BERG balance score, however based on current progress, will likely fall under high fall risk category.  Pt with very limited endurance and needs long rest breaks and increased time to complete mobility tasks.  Pt will benefit from skilled OP neuro PT in order to address deficits.    Personal Factors and Comorbidities Comorbidity 3+;Time since onset of injury/illness/exacerbation    Comorbidities see above    Examination-Activity Limitations Carry;Lift;Locomotion Level;Transfers;Stairs;Stand    Examination-Participation Restrictions Community Activity;Driving;Laundry    Stability/Clinical Decision Making Evolving/Moderate complexity    Clinical Decision Making Moderate    Rehab Potential Good    PT Frequency 2x / week    PT Duration 6 weeks    PT Treatment/Interventions ADLs/Self Care Home Management;Aquatic Therapy;DME Instruction;Gait training;Stair training;Functional mobility training;Therapeutic activities;Therapeutic exercise;Balance training;Neuromuscular re-education;Patient/family education;Passive range of motion;Dry needling;Energy conservation;Vestibular    PT Next Visit Plan Finish BERG-update goal if needed, TUG, initiate HEP for BLE strength (do some in standing to build tolerance), gait  training, stair training as able, endurance    Consulted and Agree with Plan of Care Patient             Patient will benefit from skilled therapeutic intervention in order to improve the following deficits and impairments:  Abnormal gait, Cardiopulmonary status limiting activity, Decreased activity tolerance, Decreased balance, Decreased endurance,  Decreased knowledge of use of DME, Decreased mobility, Decreased strength, Difficulty walking, Increased edema, Impaired perceived functional ability, Impaired flexibility, Impaired sensation, Postural dysfunction, Pain, Obesity  Visit Diagnosis: Unsteadiness on feet  Other abnormalities of gait and mobility  Muscle weakness (generalized)  Pain in both feet     Problem List Patient Active Problem List   Diagnosis Date Noted   Anxiety 05/07/2021   Pain in joints of both feet    AKI (acute kidney injury) ([place])    SIRS (systemic inflammatory response syndrome) (HCC)    Leukocytosis    Bilateral swelling of feet    Debility 03/30/2021   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   Gout attack 03/30/2021   Acute left ankle pain    Acute pain of left shoulder    Altered mental status    Status post tracheostomy ([place])    Severe sepsis ([place])    Acute metabolic encephalopathy    Pressure injury of skin 03/09/2021   Septic shock ([place]) 03/08/2021   Acute respiratory failure with hypoxemia ([place]) 03/05/2021   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 (HCC) 03/05/2021   Elevated LFTs 03/05/2021   Multifocal pneumonia 03/04/2021   Candida esophagitis ([place]) 02/19/2021   Neuropathic pain 02/19/2021   Polypharmacy 02/19/2021   Cough    N&V (nausea and vomiting)    AIDS (acquired immune deficiency syndrome) ([place])    Esophageal dysphagia    Gastroesophageal reflux disease with esophagitis without hemorrhage    Gastrointestinal hemorrhage    Abdominal pain     Creatinine elevation    Hypoglycemia 02/12/2021   HTN (hypertension) 02/12/2021   Anemia 02/12/2021   Acute renal failure ([place]) 02/11/2021    Cameron Sprang, PT, MPT [facility] [address] [place], [place], [address] Phone: [phone]   Fax:  [phone] 05/11/21, 1:53 PM   Name: Bruce Hunter MRN: [id number] Date of Birth: 22-Oct-1975

## 2021-05-11 NOTE — Telephone Encounter (Signed)
Spoke with patient, asked him if he has had trouble getting his Dovato or if he has been missing any doses. He says he's not sure if the hospital gave him this medication when he was discharged.   Relayed that medication was sent to [facility] and provided patient with pharmacy phone number to set up delivery. Emphasized the importance of taking Dovato daily as prescribed as this is what keeps his virus suppressed, and it is currently at over 600 copies. Patient verbalized understanding and has no further questions.   Beryle Flock, RN

## 2021-05-18 ENCOUNTER — Ambulatory Visit: Payer: Medicaid Other | Admitting: Podiatry

## 2021-05-19 ENCOUNTER — Encounter: Payer: Self-pay | Admitting: Physical Therapy

## 2021-05-19 ENCOUNTER — Ambulatory Visit: Payer: Medicaid Other | Admitting: Physical Therapy

## 2021-05-19 ENCOUNTER — Other Ambulatory Visit: Payer: Self-pay

## 2021-05-19 DIAGNOSIS — M79671 Pain in right foot: Secondary | ICD-10-CM

## 2021-05-19 DIAGNOSIS — R2681 Unsteadiness on feet: Secondary | ICD-10-CM

## 2021-05-19 DIAGNOSIS — M79672 Pain in left foot: Secondary | ICD-10-CM

## 2021-05-19 DIAGNOSIS — F419 Anxiety disorder, unspecified: Secondary | ICD-10-CM | POA: Diagnosis not present

## 2021-05-19 DIAGNOSIS — R2689 Other abnormalities of gait and mobility: Secondary | ICD-10-CM

## 2021-05-19 DIAGNOSIS — M6281 Muscle weakness (generalized): Secondary | ICD-10-CM

## 2021-05-19 NOTE — Patient Instructions (Signed)
Access Code: [email or web address] URL: [email or web address] Date: 05/19/2021 Prepared by: Willow Ora  Exercises Seated Knee Extension with Resistance - 1 x daily - 5 x weekly - 1 sets - 10 reps Seated Knee Flexion with Anchored Resistance - 1 x daily - 5 x weekly - 1 sets - 10 reps Seated Hip Abduction with Resistance - 1 x daily - 5 x weekly - 1 sets - 10 reps Heel Toe Raises with Counter Support - 1 x daily - 5 x weekly - 1 sets - 10 reps Standing March with Counter Support - 1 x daily - 5 x weekly - 1 sets - 10 reps

## 2021-05-19 NOTE — Therapy (Signed)
[facility] [address] [place], [place], [address] Phone: [phone]   Fax:  [phone]  Physical Therapy Treatment  Patient Details  Name: Bruce Hunter MRN: [id number] Date of Birth: April 01, 1976 Referring Provider (PT): Courtney Heys, MD   Encounter Date: 05/19/2021   PT End of Session - 05/19/21 0850     Visit Number 2    Number of Visits 13    Authorization Type [employer]    Authorization Time Period 12 visits from 05/19/21-07/18/21    Authorization - Visit Number 1    Authorization - Number of Visits 12    PT Start Time 0848    PT Stop Time 0930    PT Time Calculation (min) 42 min    Equipment Utilized During Treatment Gait belt    Activity Tolerance Patient limited by pain;Patient tolerated treatment well;No increased pain    Behavior During Therapy [facility] for tasks assessed/performed             Past Medical History:  Diagnosis Date   Anxiety 05/07/2021   Asthma    Diabetes mellitus without complication ([place])    Gout    Hyperlipidemia    Hypertension    Obesity     Past Surgical History:  Procedure Laterality Date   BIOPSY  02/15/2021   Procedure: BIOPSY;  Surgeon: Ronnette Juniper, MD;  Location: [facility];  Service: Gastroenterology;;   COLONOSCOPY WITH PROPOFOL N/A 02/15/2021   Procedure: COLONOSCOPY WITH PROPOFOL;  Surgeon: Ronnette Juniper, MD;  [facility];  Service: Gastroenterology;  Laterality: N/A;   ESOPHAGOGASTRODUODENOSCOPY (EGD) WITH PROPOFOL N/A 02/15/2021   Procedure: ESOPHAGOGASTRODUODENOSCOPY (EGD) WITH PROPOFOL;  Surgeon: Ronnette Juniper, MD;  [facility];  Service: Gastroenterology;  Laterality: N/A;   GANGLION CYST EXCISION Right 02/26/2020   Procedure: EXCISION TOPHUS RIGHT FOOT;  Surgeon: Evelina Bucy, DPM;  Location: [facility];  Service: Podiatry;  Laterality: Right;   NO PAST SURGERIES      There were no vitals filed for this  visit.   Subjective Assessment - 05/19/21 0848     Subjective No new complaints. Does feel his swelling is better in bil LE's. No falls. Having some back pain from sleeping in a bad position.    Pertinent History Hx of AIDS, DMII, gout, ICU myopathy    Limitations Sitting;Standing;Walking;House hold activities    How long can you stand comfortably? maybe one minute    Patient Stated Goals "I wanna walk.  I want to be able to return to golf."    Currently in Pain? Yes    Pain Score 10-Worst pain ever    Pain Location Generalized   back and LE's   Pain Orientation Right;Left;Lower    Pain Descriptors / Indicators Aching;Burning;Throbbing    Pain Type Chronic pain    Pain Onset More than a month ago    Pain Frequency Constant    Aggravating Factors  increased activity, standing, not talking meds on time, placing foot on ground wrong    Pain Relieving Factors medication, sleeping                [facility] PT Assessment - 05/19/21 0851       Berg Balance Test   Sit to Stand Able to stand  independently using hands    Standing Unsupported Able to stand 30 seconds unsupported    Sitting with Back Unsupported but Feet Supported on Floor or Stool Able to sit safely and  securely 2 minutes    Stand to Sit Controls descent by using hands    Transfers Able to transfer with verbal cueing and /or supervision    Standing Unsupported with Eyes Closed Able to stand 10 seconds with supervision    Standing Unsupported with Feet Together Needs help to attain position but able to stand for 30 seconds with feet together    From Standing, Reach Forward with Outstretched Arm Reaches forward but needs supervision    From Standing Position, Pick up Object from Floor Unable to try/needs assist to keep balance   propped against mat for stability with no UE support   From Standing Position, Turn to Look Behind Over each Shoulder Needs supervision when turning    Turn 360 Degrees Needs assistance while turning     Standing Unsupported, Alternately Place Feet on Step/Stool Needs assistance to keep from falling or unable to try    Standing Unsupported, One Foot in Front Able to take small step independently and hold 30 seconds    Standing on One Leg Tries to lift leg/unable to hold 3 seconds but remains standing independently    Total Score 23    Berg comment: <36 high risk for falls                    [facility] Adult PT Treatment/Exercise - 05/19/21 0851       Transfers   Transfers Sit to Stand;Stand to Sit;Stand Pivot Transfers    Sit to Stand 5: Supervision;With upper extremity assist;From bed;From chair/3-in-1    Stand to Sit 4: Min guard;With upper extremity assist;To bed;To chair/3-in-1    Stand Pivot Transfers 4: Min guard    Stand Pivot Transfer Details (indicate cue type and reason) with RW wheelchair<>mat table with cues on tall posture and sequencing      Exercises   Exercises Other Exercises    Other Exercises  issued ex's for strengthening and balance. Refer to [place] for full details. Min guard assist for balance/safety            Issued to HEP this session: Access Code: [email or web address] URL: [email or web address] Date: 05/19/2021 Prepared by: Willow Ora  Exercises Seated Knee Extension with Resistance - 1 x daily - 5 x weekly - 1 sets - 10 reps Seated Knee Flexion with Anchored Resistance - 1 x daily - 5 x weekly - 1 sets - 10 reps Seated Hip Abduction with Resistance - 1 x daily - 5 x weekly - 1 sets - 10 reps Heel Toe Raises with Counter Support - 1 x daily - 5 x weekly - 1 sets - 10 reps Standing March with Counter Support - 1 x daily - 5 x weekly - 1 sets - 10 reps         PT Education - 05/19/21 2123     Education Details results of Berg Balance test; initial HEP for strengthening    Person(s) Educated Patient    Methods Explanation;Demonstration;Verbal cues;Handout    Comprehension Verbalized understanding;Returned demonstration;Verbal cues  required;Need further instruction              PT Short Term Goals - 05/11/21 1342       PT SHORT TERM GOAL #1   Title Pt will be independent with initial HEP in order to indicate improved balance and functional mobility. (Target: 06/01/21)    Baseline dependent    Time 3    Period Weeks    Status New    Target  Date 06/01/21      PT SHORT TERM GOAL #2   Title Pt will improve BERG balance score by 4 points from baseline in order to indicate decreased fall risk.    Baseline unable to finish on day of eval 2/2 time constraint    Time 6    Period Weeks    Status New      PT SHORT TERM GOAL #3   Title Pt will perform TUG and goal to be updated to reflect progress and dec fall risk.    Baseline unable to perform on day of eval    Time 6    Period Weeks    Status New      PT SHORT TERM GOAL #4   Title Pt will ambulate x 100' nonstop with RW at S level in order to indicate improved independence with household mobility.    Baseline 85' at min A with RW    Time 3    Period Weeks    Status New               PT Long Term Goals - 05/11/21 1347       PT LONG TERM GOAL #1   Title Pt will be independent with final HEP in order to indicate improved balance and functional mobility (Target:    Baseline dependent    Time 6    Period Weeks    Status New    Target Date 06/25/21      PT LONG TERM GOAL #2   Title Pt will improve BERG balance score by 8 points from baseline in order to indicate dec fall risk.    Baseline unable to finish due to time constraint    Time 6    Period Weeks    Status New      PT LONG TERM GOAL #3   Title Pt will ambulate 250' nonstop with RW at S level over indoor and paved outdoor surfaces in order to indicate improved limited community mobility.    Baseline 85' indoors with RW at min A    Time 6    Period Weeks    Status New      PT LONG TERM GOAL #4   Title LTG to be written for TUG once assessed in order to relfect dec fall risk.     Baseline unable to assess on eval    Time 6    Period Weeks    Status New      PT LONG TERM GOAL #5   Title Pt will go up/down 2 small steps with LRAD at S level in order to indicate safe home entry/exit.    Baseline did not have time to assess on eval    Time 6    Period Weeks    Status New                   Plan - 05/19/21 0851     Clinical Impression Statement Today's skilled session initially focused on completion of Berg Balance test with pt's score placing him in a high fall risk category. Remainder of session focused on establishing an HEP for strengthening with no issues noted or reported in session. Also continued to address transfers with session as well. The pt is making progress toward goals and should benefit from continued PT to progress toward unmet goals.    Personal Factors and Comorbidities Comorbidity 3+;Time since onset of injury/illness/exacerbation    Comorbidities see above  Examination-Activity Limitations Carry;Lift;Locomotion Level;Transfers;Stairs;Stand    Examination-Participation Restrictions Community Activity;Driving;Laundry    Stability/Clinical Decision Making Evolving/Moderate complexity    Rehab Potential Good    PT Frequency 2x / week    PT Duration 6 weeks    PT Treatment/Interventions ADLs/Self Care Home Management;Aquatic Therapy;DME Instruction;Gait training;Stair training;Functional mobility training;Therapeutic activities;Therapeutic exercise;Balance training;Neuromuscular re-education;Patient/family education;Passive range of motion;Dry needling;Energy conservation;Vestibular    PT Next Visit Plan continue to work on LE strengthening, transfers with emphasis on standing tolerance, gait with RW and stair training    PT Home Exercise Plan Access Code: [email or web address]    Consulted and Agree with Plan of Care Patient             Patient will benefit from skilled therapeutic intervention in order to improve the following deficits and  impairments:  Abnormal gait, Cardiopulmonary status limiting activity, Decreased activity tolerance, Decreased balance, Decreased endurance, Decreased knowledge of use of DME, Decreased mobility, Decreased strength, Difficulty walking, Increased edema, Impaired perceived functional ability, Impaired flexibility, Impaired sensation, Postural dysfunction, Pain, Obesity  Visit Diagnosis: Unsteadiness on feet  Other abnormalities of gait and mobility  Muscle weakness (generalized)  Pain in both feet     Problem List Patient Active Problem List   Diagnosis Date Noted   Anxiety 05/07/2021   Pain in joints of both feet    AKI (acute kidney injury) ([place])    SIRS (systemic inflammatory response syndrome) ([facility])    Leukocytosis    Bilateral swelling of feet    Debility 03/30/2021   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   Gout attack 03/30/2021   Acute left ankle pain    Acute pain of left shoulder    Altered mental status    Status post tracheostomy ([place])    Severe sepsis ([place])    Acute metabolic encephalopathy    Pressure injury of skin 03/09/2021   Septic shock ([place]) 03/08/2021   Acute respiratory failure with hypoxemia ([place]) 03/05/2021   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 ([place]) 03/05/2021   Elevated LFTs 03/05/2021   Multifocal pneumonia 03/04/2021   Candida esophagitis ([place]) 02/19/2021   Neuropathic pain 02/19/2021   Polypharmacy 02/19/2021   Cough    N&V (nausea and vomiting)    AIDS (acquired immune deficiency syndrome) ([place])    Esophageal dysphagia    Gastroesophageal reflux disease with esophagitis without hemorrhage    Gastrointestinal hemorrhage    Abdominal pain    Creatinine elevation    Hypoglycemia 02/12/2021   HTN (hypertension) 02/12/2021   Anemia 02/12/2021   Acute renal failure ([place]) 02/11/2021   Willow Ora, PTA, [facility] [name]  [place], [place] [address] [phone] 05/19/21, 9:37 PM   Name: LIRAN NERISON MRN: [id number] Date of Birth: May 20, 1976

## 2021-05-21 ENCOUNTER — Other Ambulatory Visit: Payer: Self-pay

## 2021-05-21 ENCOUNTER — Encounter: Payer: Self-pay | Admitting: Physical Therapy

## 2021-05-21 ENCOUNTER — Ambulatory Visit: Payer: Medicaid Other | Admitting: Physical Therapy

## 2021-05-21 DIAGNOSIS — R2681 Unsteadiness on feet: Secondary | ICD-10-CM

## 2021-05-21 DIAGNOSIS — M79671 Pain in right foot: Secondary | ICD-10-CM

## 2021-05-21 DIAGNOSIS — F419 Anxiety disorder, unspecified: Secondary | ICD-10-CM | POA: Diagnosis not present

## 2021-05-21 DIAGNOSIS — M6281 Muscle weakness (generalized): Secondary | ICD-10-CM

## 2021-05-21 DIAGNOSIS — R2689 Other abnormalities of gait and mobility: Secondary | ICD-10-CM

## 2021-05-21 DIAGNOSIS — M79672 Pain in left foot: Secondary | ICD-10-CM

## 2021-05-23 NOTE — Therapy (Signed)
[facility] [address] [place], [place], [address] Phone: [phone]   Fax:  [phone]  Physical Therapy Treatment  Patient Details  Name: Bruce Hunter MRN: [id number] Date of Birth: 07-29-1976 Referring Provider (PT): Courtney Heys, MD   Encounter Date: 05/21/2021    05/21/21 1325  PT Visits / Re-Eval  Visit Number 3  Number of Visits 13  Authorization  Authorization Type [facility] Medicaid  Authorization Time Period 12 visits from 05/19/21-07/18/21  Authorization - Visit Number 2  Authorization - Number of Visits 12  PT Time Calculation  PT Start Time 1320  PT Stop Time 1400  PT Time Calculation (min) 40 min  PT - End of Session  Equipment Utilized During Treatment Gait belt  Activity Tolerance Patient limited by pain;Patient tolerated treatment well;No increased pain  Behavior During Therapy [facility] for tasks assessed/performed    Past Medical History:  Diagnosis Date   Anxiety 05/07/2021   Asthma    Diabetes mellitus without complication ([place])    Gout    Hyperlipidemia    Hypertension    Obesity     Past Surgical History:  Procedure Laterality Date   BIOPSY  02/15/2021   Procedure: BIOPSY;  Surgeon: Ronnette Juniper, MD;  [facility];  Service: Gastroenterology;;   COLONOSCOPY WITH PROPOFOL N/A 02/15/2021   Procedure: COLONOSCOPY WITH PROPOFOL;  Surgeon: Ronnette Juniper, MD;  [facility];  Service: Gastroenterology;  Laterality: N/A;   ESOPHAGOGASTRODUODENOSCOPY (EGD) WITH PROPOFOL N/A 02/15/2021   Procedure: ESOPHAGOGASTRODUODENOSCOPY (EGD) WITH PROPOFOL;  Surgeon: Ronnette Juniper, MD;  [facility];  Service: Gastroenterology;  Laterality: N/A;   GANGLION CYST EXCISION Right 02/26/2020   Procedure: EXCISION TOPHUS RIGHT FOOT;  Surgeon: Evelina Bucy, DPM;  Location: [facility];  Service: Podiatry;  Laterality: Right;   NO PAST SURGERIES      There were no vitals  filed for this visit.     05/21/21 1323  Symptoms/Limitations  Subjective Reports increased bil foot pain after last session. Both are still hurting today. Did try some of the ex's at home since last session. "I can't walk because my feet hurt'  Pertinent History Hx of AIDS, DMII, gout, ICU myopathy  Limitations Sitting;Standing;Walking;House hold activities  How long can you stand comfortably? maybe one minute  How long can you walk comfortably? Isn't walking  Patient Stated Goals "I wanna walk.  I want to be able to return to golf."  Pain Assessment  Currently in Pain? Yes  Pain Score 10  Pain Location Generalized (back and bil LE's mostly feet)  Pain Orientation Right;Left;Lower  Pain Descriptors / Indicators Aching;Burning;Throbbing  Pain Type Chronic pain  Pain Onset More than a month ago  Pain Frequency Constant  Aggravating Factors  increased activity, standing, not talking meds on time, placing foot on ground wrong  Pain Relieving Factors medication, sleeping        05/21/21 1327  Transfers  Transfers Sit to Stand;Stand to [employer] Transfers  Sit to Stand 5: Supervision;With upper extremity assist;From bed;From chair/3-in-1  Sit to Stand Details Verbal cues for sequencing;Verbal cues for technique  Sit to Stand Details (indicate cue type and reason) increased time needed with cues to scoot closer to edge prior to standing up  Stand to Sit 5: Supervision;With upper extremity assist;To bed;To chair/3-in-1  Stand to Sit Details (indicate cue type and reason) Verbal cues for sequencing;Verbal cues for technique;Verbal cues for safe use of DME/AE  Stand to  Sit Details cues to use UE's to control descent  Stand Pivot Transfers 5: Supervision  Stand Pivot Transfer Details (indicate cue type and reason) no AD wheelchair<>Scifit, then wheelchair<>mat table  Exercises  Exercises Other Exercises  Other Exercises  seated at edge of mat: heel<>toes raises within pt's pain  free ranges for 10 reps, 2# ankle weights- long arc quads with 3 sec holds, then alternating marching for 10 reps each, cues for slow and controlled movements; standing with UE support on sturdy surface- mini squats x 10 reps, then alternating hip abduction x 10 reps each side. cues on posture and ex form needed. Min guard assist for safety.  Knee/Hip Exercises: Aerobic  Other Aerobic Scifit level 2.0 with UE LE"s (seat 24, arms 8) for 8 minutes with goal >/= 80 steps per minute for strengthening and activity tolerance.        PT Short Term Goals - 05/11/21 1342       PT SHORT TERM GOAL #1   Title Pt will be independent with initial HEP in order to indicate improved balance and functional mobility. (Target: 06/01/21)    Baseline dependent    Time 3    Period Weeks    Status New    Target Date 06/01/21      PT SHORT TERM GOAL #2   Title Pt will improve BERG balance score by 4 points from baseline in order to indicate decreased fall risk.    Baseline unable to finish on day of eval 2/2 time constraint    Time 6    Period Weeks    Status New      PT SHORT TERM GOAL #3   Title Pt will perform TUG and goal to be updated to reflect progress and dec fall risk.    Baseline unable to perform on day of eval    Time 6    Period Weeks    Status New      PT SHORT TERM GOAL #4   Title Pt will ambulate x 100' nonstop with RW at S level in order to indicate improved independence with household mobility.    Baseline 85' at min A with RW    Time 3    Period Weeks    Status New               PT Long Term Goals - 05/11/21 1347       PT LONG TERM GOAL #1   Title Pt will be independent with final HEP in order to indicate improved balance and functional mobility (Target:    Baseline dependent    Time 6    Period Weeks    Status New    Target Date 06/25/21      PT LONG TERM GOAL #2   Title Pt will improve BERG balance score by 8 points from baseline in order to indicate dec fall  risk.    Baseline unable to finish due to time constraint    Time 6    Period Weeks    Status New      PT LONG TERM GOAL #3   Title Pt will ambulate 250' nonstop with RW at S level over indoor and paved outdoor surfaces in order to indicate improved limited community mobility.    Baseline 85' indoors with RW at min A    Time 6    Period Weeks    Status New      PT LONG TERM GOAL #4  Title LTG to be written for TUG once assessed in order to relfect dec fall risk.    Baseline unable to assess on eval    Time 6    Period Weeks    Status New      PT LONG TERM GOAL #5   Title Pt will go up/down 2 small steps with LRAD at S level in order to indicate safe home entry/exit.    Baseline did not have time to assess on eval    Time 6    Period Weeks    Status New               05/21/21 1326  Plan  Clinical Impression Statement Today's skilled session continued to focus on transfer training and strengthening. Rest breaks needed through out session due to bil foot pain. The pt is making limited progress due to pain. The pt should benefit from continued PT to progress toward unmet goals.  Personal Factors and Comorbidities Comorbidity 3+;Time since onset of injury/illness/exacerbation  Comorbidities see above  Examination-Activity Limitations Carry;Lift;Locomotion Level;Transfers;Stairs;Stand  Examination-Participation Restrictions Community Activity;Driving;Laundry  Pt will benefit from skilled therapeutic intervention in order to improve on the following deficits Abnormal gait;Cardiopulmonary status limiting activity;Decreased activity tolerance;Decreased balance;Decreased endurance;Decreased knowledge of use of DME;Decreased mobility;Decreased strength;Difficulty walking;Increased edema;Impaired perceived functional ability;Impaired flexibility;Impaired sensation;Postural dysfunction;Pain;Obesity  Stability/Clinical Decision Making Evolving/Moderate complexity  Rehab Potential Good   PT Frequency 2x / week  PT Duration 6 weeks  PT Treatment/Interventions ADLs/Self Care Home Management;Aquatic Therapy;DME Instruction;Gait training;Stair training;Functional mobility training;Therapeutic activities;Therapeutic exercise;Balance training;Neuromuscular re-education;Patient/family education;Passive range of motion;Dry needling;Energy conservation;Vestibular  PT Next Visit Plan continue to work on LE strengthening, transfers with emphasis on standing tolerance, gait with RW and stair training  PT Home Exercise Plan Access Code: [email or web address]  Consulted and Agree with Plan of Care Patient         Patient will benefit from skilled therapeutic intervention in order to improve the following deficits and impairments:  Abnormal gait, Cardiopulmonary status limiting activity, Decreased activity tolerance, Decreased balance, Decreased endurance, Decreased knowledge of use of DME, Decreased mobility, Decreased strength, Difficulty walking, Increased edema, Impaired perceived functional ability, Impaired flexibility, Impaired sensation, Postural dysfunction, Pain, Obesity  Visit Diagnosis: Unsteadiness on feet  Other abnormalities of gait and mobility  Muscle weakness (generalized)  Pain in both feet     Problem List Patient Active Problem List   Diagnosis Date Noted   Anxiety 05/07/2021   Pain in joints of both feet    AKI (acute kidney injury) ([place])    SIRS (systemic inflammatory response syndrome) ([facility])    Leukocytosis    Bilateral swelling of feet    Debility 03/30/2021   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   Gout attack 03/30/2021   Acute left ankle pain    Acute pain of left shoulder    Altered mental status    Status post tracheostomy ([place])    Severe sepsis ([place])    Acute metabolic encephalopathy    Pressure injury of skin 03/09/2021   Septic shock ([place]) 03/08/2021   Acute respiratory failure with hypoxemia ([place]) 03/05/2021   PCP (pneumocystis carinii  pneumonia) ([place]) 03/05/2021   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 ([facility]) 03/05/2021   Elevated LFTs 03/05/2021   Multifocal pneumonia 03/04/2021   Candida esophagitis ([place]) 02/19/2021   Neuropathic pain 02/19/2021   Polypharmacy 02/19/2021   Cough    N&V (nausea and vomiting)    AIDS (acquired immune deficiency  syndrome) ([place])    Esophageal dysphagia    Gastroesophageal reflux disease with esophagitis without hemorrhage    Gastrointestinal hemorrhage    Abdominal pain    Creatinine elevation    Hypoglycemia 02/12/2021   HTN (hypertension) 02/12/2021   Anemia 02/12/2021   Acute renal failure ([place]) 02/11/2021    Willow Ora, PTA, [facility] [address] [place], [place] [address] [phone] 05/23/21, 9:52 PM   Name: Bruce Hunter MRN: [id number] Date of Birth: [birth year]

## 2021-05-25 ENCOUNTER — Other Ambulatory Visit: Payer: Self-pay

## 2021-05-25 ENCOUNTER — Encounter: Payer: Self-pay | Admitting: Rehabilitation

## 2021-05-25 ENCOUNTER — Ambulatory Visit: Payer: Medicaid Other | Admitting: Rehabilitation

## 2021-05-25 DIAGNOSIS — R2681 Unsteadiness on feet: Secondary | ICD-10-CM

## 2021-05-25 DIAGNOSIS — M6281 Muscle weakness (generalized): Secondary | ICD-10-CM

## 2021-05-25 DIAGNOSIS — M79672 Pain in left foot: Secondary | ICD-10-CM

## 2021-05-25 DIAGNOSIS — R2689 Other abnormalities of gait and mobility: Secondary | ICD-10-CM

## 2021-05-25 DIAGNOSIS — F419 Anxiety disorder, unspecified: Secondary | ICD-10-CM | POA: Diagnosis not present

## 2021-05-25 NOTE — Therapy (Signed)
[facility] [address] [place], [place], [address] Phone: [phone]   Fax:  [phone]  Physical Therapy Treatment  Patient Details  Name: Bruce Hunter MRN: [id number] Date of Birth: 16-Jan-1976 Referring Provider (PT): Courtney Heys, MD   Encounter Date: 05/25/2021   PT End of Session - 05/25/21 1456     Visit Number 4    Number of Visits 13    Authorization Type [employer]    Authorization Time Period 12 visits from 05/19/21-07/18/21    Authorization - Visit Number 3    Authorization - Number of Visits 12    PT Start Time [id number]    PT Stop Time [id number]    PT Time Calculation (min) 25 min    Equipment Utilized During Treatment Gait belt    Activity Tolerance Patient limited by pain;Patient tolerated treatment well;No increased pain    Behavior During Therapy [facility] for tasks assessed/performed             Past Medical History:  Diagnosis Date   Anxiety 05/07/2021   Asthma    Diabetes mellitus without complication ([place])    Gout    Hyperlipidemia    Hypertension    Obesity     Past Surgical History:  Procedure Laterality Date   BIOPSY  02/15/2021   Procedure: BIOPSY;  Surgeon: Ronnette Juniper, MD;  Location: [facility];  Service: Gastroenterology;;   COLONOSCOPY WITH PROPOFOL N/A 02/15/2021   Procedure: COLONOSCOPY WITH PROPOFOL;  Surgeon: Ronnette Juniper, MD;  Location: [facility];  Service: Gastroenterology;  Laterality: N/A;   ESOPHAGOGASTRODUODENOSCOPY (EGD) WITH PROPOFOL N/A 02/15/2021   Procedure: ESOPHAGOGASTRODUODENOSCOPY (EGD) WITH PROPOFOL;  Surgeon: Ronnette Juniper, MD;  [facility]: [facility];  Service: Gastroenterology;  Laterality: N/A;   GANGLION CYST EXCISION Right 02/26/2020   Procedure: EXCISION TOPHUS RIGHT FOOT;  Surgeon: Evelina Bucy, DPM;  Location: [facility] [facility];  Service: Podiatry;  Laterality: Right;   NO PAST SURGERIES      There were no vitals filed for this  visit.   Subjective Assessment - 05/25/21 1425     Subjective Reports feeling a little better.  Transportation was late getting him.    Pertinent History Hx of AIDS, DMII, gout, ICU myopathy    Limitations Sitting;Standing;Walking;House hold activities    Patient Stated Goals "I wanna walk.  I want to be able to return to golf."    Currently in Pain? Yes    Pain Score 8     Pain Location Foot    Pain Orientation Right;Left    Pain Descriptors / Indicators Aching;Burning;Throbbing    Pain Type Chronic pain    Pain Onset More than a month ago    Pain Frequency Constant    Aggravating Factors  Increased activity    Pain Relieving Factors medication, rest                               [facility] Adult PT Treatment/Exercise - 05/25/21 1433       Transfers   Transfers Sit to Stand;Stand to Sit;Stand Pivot Transfers    Sit to Stand 5: Supervision;With upper extremity assist;From bed;From chair/3-in-1    Sit to Stand Details Verbal cues for sequencing;Verbal cues for technique    Stand to Sit 5: Supervision;With upper extremity assist;To bed;To chair/3-in-1    Stand to Sit Details (indicate cue type and reason) Verbal cues for sequencing;Verbal cues for technique;Verbal  cues for safe use of DME/AE      Ambulation/Gait   Ambulation/Gait Yes    Ambulation/Gait Assistance 5: Supervision    Ambulation/Gait Assistance Details Min cues for posture and staying close to RW.  Pt did very well but continued to c/o ankle pain throughout.  Allowed rest breaks following tasks and educated on importance of increasing mobility slowly.    Ambulation Distance (Feet) 115 Feet   and another 150' x 1 with RW   Assistive device Rolling walker    Gait Pattern Step-through pattern;Decreased stride length;Decreased hip/knee flexion - right;Decreased dorsiflexion - left;Decreased hip/knee flexion - left;Decreased dorsiflexion - right;Trunk flexed    Ambulation Surface Level;Indoor       Self-Care   Self-Care Other Self-Care Comments    Other Self-Care Comments  Utilized ace wraps in session in figure 8 pattern to reduce swelling in LEs.  Pt reports his TEDs are too loose now.  Educated on how to wrap at home.      Exercises   Exercises Knee/Hip      Knee/Hip Exercises: Standing   Heel Raises Both;1 set;10 reps    Hip Flexion Stengthening;Both;1 set;20 reps   marching with w/c in front of him   Other Standing Knee Exercises Sit<>stand x 10 reps without UE support                     PT Education - 05/25/21 1455     Education Details importance of mobility and ace wrapping LEs to reduce swelling    Person(s) Educated Patient    Methods Explanation;Demonstration;Verbal cues    Comprehension Verbalized understanding              PT Short Term Goals - 05/25/21 1457       PT SHORT TERM GOAL #1   Title Pt will be independent with initial HEP in order to indicate improved balance and functional mobility. (Target: 06/01/21)    Baseline dependent    Time 3    Period Weeks    Status New    Target Date 06/01/21      PT SHORT TERM GOAL #2   Title Pt will improve BERG balance score by 4 points from baseline in order to indicate decreased fall risk.    Baseline unable to finish on day of eval 2/2 time constraint    Time 6    Period Weeks    Status New      PT SHORT TERM GOAL #3   Title Pt will perform TUG and goal to be updated to reflect progress and dec fall risk.    Baseline unable to perform on day of eval    Time 6    Period Weeks    Status New      PT SHORT TERM GOAL #4   Title Pt will ambulate x 100' nonstop with RW at S level in order to indicate improved independence with household mobility.    Baseline 150' at S level with RW on  05/25/2021    Time 3    Period Weeks    Status Achieved               PT Long Term Goals - 05/11/21 1347       PT LONG TERM GOAL #1   Title Pt will be independent with final HEP in order to indicate  improved balance and functional mobility (Target:    Baseline dependent    Time 6  Period Weeks    Status New    Target Date 06/25/21      PT LONG TERM GOAL #2   Title Pt will improve BERG balance score by 8 points from baseline in order to indicate dec fall risk.    Baseline unable to finish due to time constraint    Time 6    Period Weeks    Status New      PT LONG TERM GOAL #3   Title Pt will ambulate 250' nonstop with RW at S level over indoor and paved outdoor surfaces in order to indicate improved limited community mobility.    Baseline 85' indoors with RW at min A    Time 6    Period Weeks    Status New      PT LONG TERM GOAL #4   Title LTG to be written for TUG once assessed in order to relfect dec fall risk.    Baseline unable to assess on eval    Time 6    Period Weeks    Status New      PT LONG TERM GOAL #5   Title Pt will go up/down 2 small steps with LRAD at S level in order to indicate safe home entry/exit.    Baseline did not have time to assess on eval    Time 6    Period Weeks    Status New                   Plan - 05/25/21 1456     Clinical Impression Statement Session limited due to pt arriving 20 mins late (transportation late picking him up).  Focused on LE strengthening, standing tolerance and gait with RW.  He continues to have high levels of ankle and foot pain but did well with all tasks.  PT wrapped up to knee level with ace wraps to reduce swelling in LEs.    Personal Factors and Comorbidities Comorbidity 3+;Time since onset of injury/illness/exacerbation    Comorbidities see above    Examination-Activity Limitations Carry;Lift;Locomotion Level;Transfers;Stairs;Stand    Examination-Participation Restrictions Community Activity;Driving;Laundry    Stability/Clinical Decision Making Evolving/Moderate complexity    Rehab Potential Good    PT Frequency 2x / week    PT Duration 6 weeks    PT Treatment/Interventions ADLs/Self Care Home  Management;Aquatic Therapy;DME Instruction;Gait training;Stair training;Functional mobility training;Therapeutic activities;Therapeutic exercise;Balance training;Neuromuscular re-education;Patient/family education;Passive range of motion;Dry needling;Energy conservation;Vestibular    PT Next Visit Plan continue to work on LE strengthening, transfers with emphasis on standing tolerance, gait with RW and stair training    PT Home Exercise Plan Access Code: [email or web address]    Consulted and Agree with Plan of Care Patient             Patient will benefit from skilled therapeutic intervention in order to improve the following deficits and impairments:  Abnormal gait, Cardiopulmonary status limiting activity, Decreased activity tolerance, Decreased balance, Decreased endurance, Decreased knowledge of use of DME, Decreased mobility, Decreased strength, Difficulty walking, Increased edema, Impaired perceived functional ability, Impaired flexibility, Impaired sensation, Postural dysfunction, Pain, Obesity  Visit Diagnosis: Unsteadiness on feet  Muscle weakness (generalized)  Other abnormalities of gait and mobility  Pain in both feet     Problem List Patient Active Problem List   Diagnosis Date Noted   Anxiety 05/07/2021   Pain in joints of both feet    AKI (acute kidney injury) ([place])    SIRS (systemic inflammatory response syndrome) ([place])  Leukocytosis    Bilateral swelling of feet    Debility 03/30/2021   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   Gout attack 03/30/2021   Acute left ankle pain    Acute pain of left shoulder    Altered mental status    Status post tracheostomy ([place])    Severe sepsis ([place])    Acute metabolic encephalopathy    Pressure injury of skin 03/09/2021   Septic shock ([place]) 03/08/2021   Acute respiratory failure with hypoxemia ([place]) 03/05/2021   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  body ([place]) 03/05/2021   Elevated LFTs 03/05/2021   Multifocal pneumonia 03/04/2021   Candida esophagitis ([place]) 02/19/2021   Neuropathic pain 02/19/2021   Polypharmacy 02/19/2021   Cough    N&V (nausea and vomiting)    AIDS (acquired immune deficiency syndrome) ([place])    Esophageal dysphagia    Gastroesophageal reflux disease with esophagitis without hemorrhage    Gastrointestinal hemorrhage    Abdominal pain    Creatinine elevation    Hypoglycemia 02/12/2021   HTN (hypertension) 02/12/2021   Anemia 02/12/2021   Acute renal failure ([place]) 02/11/2021    Cameron Sprang, PT, MPT [facility] [address] [place], [place], [address] Phone: [phone]   Fax:  [phone] 05/25/21, 2:59 PM   Name: ALDEN ROSNER MRN: [id number] Date of Birth: October 25, 1975

## 2021-05-28 ENCOUNTER — Other Ambulatory Visit: Payer: Self-pay

## 2021-05-28 ENCOUNTER — Ambulatory Visit: Payer: Medicaid Other | Admitting: Physical Therapy

## 2021-05-28 DIAGNOSIS — M6281 Muscle weakness (generalized): Secondary | ICD-10-CM

## 2021-05-28 DIAGNOSIS — R2689 Other abnormalities of gait and mobility: Secondary | ICD-10-CM

## 2021-05-28 DIAGNOSIS — M79671 Pain in right foot: Secondary | ICD-10-CM

## 2021-05-28 DIAGNOSIS — R2681 Unsteadiness on feet: Secondary | ICD-10-CM

## 2021-05-28 DIAGNOSIS — F419 Anxiety disorder, unspecified: Secondary | ICD-10-CM | POA: Diagnosis not present

## 2021-05-28 DIAGNOSIS — M79672 Pain in left foot: Secondary | ICD-10-CM

## 2021-05-28 NOTE — Therapy (Signed)
[facility] [address] [place], [place], [address] Phone: [phone]   Fax:  [phone]  Physical Therapy Treatment  Patient Details  Name: Bruce Hunter MRN: [id number] Date of Birth: [birth year] Referring Provider (PT): Courtney Heys, MD   Encounter Date: 05/28/2021   PT End of Session - 05/28/21 1325     Visit Number 5    Number of Visits 13    Authorization Type [employer]    Authorization Time Period 12 visits from 05/19/21-07/18/21    Authorization - Visit Number 4    Authorization - Number of Visits 12    PT Start Time [id number]    PT Stop Time 1400    PT Time Calculation (min) 41 min    Equipment Utilized During Treatment Gait belt    Activity Tolerance Patient limited by pain;Patient tolerated treatment well;No increased pain    Behavior During Therapy [facility] for tasks assessed/performed             Past Medical History:  Diagnosis Date   Anxiety 05/07/2021   Asthma    Diabetes mellitus without complication ([place])    Gout    Hyperlipidemia    Hypertension    Obesity     Past Surgical History:  Procedure Laterality Date   BIOPSY  02/15/2021   Procedure: BIOPSY;  Surgeon: Ronnette Juniper, MD;  [facility];  Service: Gastroenterology;;   COLONOSCOPY WITH PROPOFOL N/A 02/15/2021   Procedure: COLONOSCOPY WITH PROPOFOL;  Surgeon: Ronnette Juniper, MD;  [facility];  Service: Gastroenterology;  Laterality: N/A;   ESOPHAGOGASTRODUODENOSCOPY (EGD) WITH PROPOFOL N/A 02/15/2021   Procedure: ESOPHAGOGASTRODUODENOSCOPY (EGD) WITH PROPOFOL;  Surgeon: Ronnette Juniper, MD;  [facility];  Service: Gastroenterology;  Laterality: N/A;   GANGLION CYST EXCISION Right 02/26/2020   Procedure: EXCISION TOPHUS RIGHT FOOT;  Surgeon: Evelina Bucy, DPM;  Location: [facility];  Service: Podiatry;  Laterality: Right;   NO PAST SURGERIES      There were no vitals filed for this  visit.   Subjective Assessment - 05/28/21 1322     Subjective No new complaints. Reports he has been walking to the "real bathroom" more instead of the BSC. Continued to report pain, mostly in ankles/feet. Unable to get the wraps on by himself. Has a personal care attendant starting on Monday. Advised to see if this person can help.    Pertinent History Hx of AIDS, DMII, gout, ICU myopathy    Limitations Sitting;Standing;Walking;House hold activities    How long can you stand comfortably? maybe one minute    How long can you walk comfortably? Isn't walking    Patient Stated Goals "I wanna walk.  I want to be able to return to golf."    Currently in Pain? Yes    Pain Score 9     Pain Location Foot    Pain Orientation Right;Left    Pain Descriptors / Indicators Aching;Burning;Throbbing    Pain Type Chronic pain    Pain Radiating Towards up to ankles    Pain Onset More than a month ago    Pain Frequency Constant    Aggravating Factors  increased activity    Pain Relieving Factors medication, rest                    [facility] Adult PT Treatment/Exercise - 05/28/21 1325       Transfers   Transfers Sit to Stand;Stand to [employer]  Sit to Stand 5: Supervision;With upper extremity assist;From bed;From chair/3-in-1    Stand to Sit 5: Supervision;With upper extremity assist;To bed;To chair/3-in-1      Ambulation/Gait   Ambulation/Gait Yes    Ambulation/Gait Assistance 5: Supervision    Ambulation/Gait Assistance Details cues for posture, increased step/stride length and walker position with gait    Ambulation Distance (Feet) 115 Feet   x1   Assistive device Rolling walker    Gait Pattern Step-through pattern;Decreased stride length;Decreased hip/knee flexion - right;Decreased dorsiflexion - left;Decreased hip/knee flexion - left;Decreased dorsiflexion - right;Trunk flexed    Ambulation Surface Level;Indoor      Lumbar Exercises: Aerobic   Other Aerobic Exercise Scifit UE/LE's level  2.5 x 8 minutes with goal >/= 60 steps per minute for strengthening and activity tolerance.      Knee/Hip Exercises: Standing   Heel Raises Both;1 set;10 reps;Limitations    Heel Raises Limitations with UE support, cues for increased heel lift bil sides    Hip Flexion AROM;Stengthening;Both;1 set;10 reps;Knee bent;Limitations    Hip Flexion Limitations alternating high knee marching x 10 reps each side with cues on posture with UE support.      Knee/Hip Exercises: Supine   Bridges AROM;Strengthening;Both;1 set;10 reps;Limitations    Bridges Limitations cues for hold time and increased hip extension    Straight Leg Raises AROM;Strengthening;Both;1 set;10 reps;Limitations    Straight Leg Raises Limitations cues to keep knee straight and for slow, controlled movements    Other Supine Knee/Hip Exercises green band resistance for hip fall outs x 10 reps each side.                  PT Short Term Goals - 05/25/21 1457       PT SHORT TERM GOAL #1   Title Pt will be independent with initial HEP in order to indicate improved balance and functional mobility. (Target: 06/01/21)    Baseline dependent    Time 3    Period Weeks    Status New    Target Date 06/01/21      PT SHORT TERM GOAL #2   Title Pt will improve BERG balance score by 4 points from baseline in order to indicate decreased fall risk.    Baseline unable to finish on day of eval 2/2 time constraint    Time 6    Period Weeks    Status New      PT SHORT TERM GOAL #3   Title Pt will perform TUG and goal to be updated to reflect progress and dec fall risk.    Baseline unable to perform on day of eval    Time 6    Period Weeks    Status New      PT SHORT TERM GOAL #4   Title Pt will ambulate x 100' nonstop with RW at S level in order to indicate improved independence with household mobility.    Baseline 150' at S level with RW on  05/25/2021    Time 3    Period Weeks    Status Achieved               PT Long  Term Goals - 05/11/21 1347       PT LONG TERM GOAL #1   Title Pt will be independent with final HEP in order to indicate improved balance and functional mobility (Target:    Baseline dependent    Time 6    Period Weeks  Status New    Target Date 06/25/21      PT LONG TERM GOAL #2   Title Pt will improve BERG balance score by 8 points from baseline in order to indicate dec fall risk.    Baseline unable to finish due to time constraint    Time 6    Period Weeks    Status New      PT LONG TERM GOAL #3   Title Pt will ambulate 250' nonstop with RW at S level over indoor and paved outdoor surfaces in order to indicate improved limited community mobility.    Baseline 85' indoors with RW at min A    Time 6    Period Weeks    Status New      PT LONG TERM GOAL #4   Title LTG to be written for TUG once assessed in order to relfect dec fall risk.    Baseline unable to assess on eval    Time 6    Period Weeks    Status New      PT LONG TERM GOAL #5   Title Pt will go up/down 2 small steps with LRAD at S level in order to indicate safe home entry/exit.    Baseline did not have time to assess on eval    Time 6    Period Weeks    Status New                   Plan - 05/28/21 1325     Clinical Impression Statement Today's skilled session continued to focus on strengthening and gait with RW. Pt continued to report pain in LE's that did not change with session. The pt was able to continue with increased gait with RW this session as pt did with previous session. The pt is progressing toward goals and should benefit from continued PT to progress toward unmet goals.    Personal Factors and Comorbidities Comorbidity 3+;Time since onset of injury/illness/exacerbation    Comorbidities see above    Examination-Activity Limitations Carry;Lift;Locomotion Level;Transfers;Stairs;Stand    Examination-Participation Restrictions Community Activity;Driving;Laundry    Stability/Clinical  Decision Making Evolving/Moderate complexity    Rehab Potential Good    PT Frequency 2x / week    PT Duration 6 weeks    PT Treatment/Interventions ADLs/Self Care Home Management;Aquatic Therapy;DME Instruction;Gait training;Stair training;Functional mobility training;Therapeutic activities;Therapeutic exercise;Balance training;Neuromuscular re-education;Patient/family education;Passive range of motion;Dry needling;Energy conservation;Vestibular    PT Next Visit Plan continue to work on LE strengthening, transfers with emphasis on standing tolerance, gait with RW and stair training    PT Home Exercise Plan Access Code: [email or web address]    Consulted and Agree with Plan of Care Patient             Patient will benefit from skilled therapeutic intervention in order to improve the following deficits and impairments:  Abnormal gait, Cardiopulmonary status limiting activity, Decreased activity tolerance, Decreased balance, Decreased endurance, Decreased knowledge of use of DME, Decreased mobility, Decreased strength, Difficulty walking, Increased edema, Impaired perceived functional ability, Impaired flexibility, Impaired sensation, Postural dysfunction, Pain, Obesity  Visit Diagnosis: Unsteadiness on feet  Muscle weakness (generalized)  Other abnormalities of gait and mobility  Pain in both feet     Problem List Patient Active Problem List   Diagnosis Date Noted   Anxiety 05/07/2021   Pain in joints of both feet    AKI (acute kidney injury) ([place])    SIRS (systemic inflammatory response syndrome) ([place])  Leukocytosis    Bilateral swelling of feet    Debility 03/30/2021   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   Gout attack 03/30/2021   Acute left ankle pain    Acute pain of left shoulder    Altered mental status    Status post tracheostomy ([place])    Severe sepsis ([place])    Acute metabolic encephalopathy    Pressure injury of skin 03/09/2021   Septic shock ([place]) 03/08/2021   Acute  respiratory failure with hypoxemia ([place]) 03/05/2021   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 ([place]) 03/05/2021   Elevated LFTs 03/05/2021   Multifocal pneumonia 03/04/2021   Candida esophagitis ([place]) 02/19/2021   Neuropathic pain 02/19/2021   Polypharmacy 02/19/2021   Cough    N&V (nausea and vomiting)    AIDS (acquired immune deficiency syndrome) ([place])    Esophageal dysphagia    Gastroesophageal reflux disease with esophagitis without hemorrhage    Gastrointestinal hemorrhage    Abdominal pain    Creatinine elevation    Hypoglycemia 02/12/2021   HTN (hypertension) 02/12/2021   Anemia 02/12/2021   Acute renal failure ([place]) 02/11/2021    Willow Ora, PTA 05/28/2021, 8:07 PM  [facility] [address] [place], [place], [address] Phone: [phone]   Fax:  [phone]  Name: KEVONN DILORENZO MRN: [id number] Date of Birth: 1976-02-08

## 2021-06-02 ENCOUNTER — Other Ambulatory Visit: Payer: Self-pay | Admitting: Physical Medicine and Rehabilitation

## 2021-06-02 ENCOUNTER — Ambulatory Visit: Payer: Medicaid Other | Admitting: Physical Therapy

## 2021-06-02 ENCOUNTER — Encounter
Payer: Medicaid Other | Attending: Physical Medicine and Rehabilitation | Admitting: Physical Medicine and Rehabilitation

## 2021-06-02 ENCOUNTER — Other Ambulatory Visit: Payer: Self-pay

## 2021-06-02 ENCOUNTER — Other Ambulatory Visit (HOSPITAL_COMMUNITY): Payer: Self-pay

## 2021-06-02 VITALS — BP 114/87 | HR 86 | Temp 98.1°F

## 2021-06-02 DIAGNOSIS — F419 Anxiety disorder, unspecified: Secondary | ICD-10-CM | POA: Diagnosis not present

## 2021-06-02 DIAGNOSIS — G7281 Critical illness myopathy: Secondary | ICD-10-CM | POA: Insufficient documentation

## 2021-06-02 DIAGNOSIS — M25572 Pain in left ankle and joints of left foot: Secondary | ICD-10-CM | POA: Insufficient documentation

## 2021-06-02 DIAGNOSIS — M7989 Other specified soft tissue disorders: Secondary | ICD-10-CM | POA: Insufficient documentation

## 2021-06-02 DIAGNOSIS — M25571 Pain in right ankle and joints of right foot: Secondary | ICD-10-CM | POA: Insufficient documentation

## 2021-06-02 DIAGNOSIS — R5381 Other malaise: Secondary | ICD-10-CM | POA: Diagnosis not present

## 2021-06-02 MED ORDER — FAMOTIDINE 40 MG PO TABS: 40.0000 mg | ORAL_TABLET | Freq: Every day | ORAL | 3 refills | Status: DC

## 2021-06-02 NOTE — Progress Notes (Signed)
Subjective:    Patient ID: Bruce Hunter, male    DOB: [birth year], 45 y.o.   MRN: [id number]  HPI Bruce Hunter is a 45 year old man who presents for hospital follow-up for ICU myopathy.   1) Debility -reports feeling tired and having little energy- this depends on whether he has company of not -denies falls, he has been using the wheelchair.  -has started driving.   2) Overeating -reports that he eats all the time.  3) Peripheral neuropathy -continues to have pain in bilateral lower extremities.  -ready to try Qutenza today.  -no longer using morphine -current pain in feet is 10/10  4) ADHD -sometimes has trouble concentrating  Review of Systems  Constitutional: Negative.   HENT: Negative.    Eyes: Negative.   Respiratory: Negative.    Cardiovascular:  Positive for leg swelling.  Gastrointestinal: Negative.   Endocrine: Negative.   Genitourinary: Negative.   Musculoskeletal: Negative.   Skin: Negative.   Allergic/Immunologic: Negative.   Neurological:  Positive for weakness.  Hematological: Negative.   Psychiatric/Behavioral: Negative.        Objective:   Physical Exam Gen: no distress, normal appearing HEENT: oral mucosa pink and moist, NCAT Cardio: Reg rate Chest: normal effort, normal rate of breathing Abd: soft, abdominal obesity Ext: bilateral lower extremity swelling Psych: pleasant, normal affect, cheerful and jovial. Denies depression Skin: imprints from socks in both legs Neuro: Alert and oriented x3. Hyperalgesia to both feet with gentle palpation Musculoskeletal: wheelchair bound    Assessment & Plan:  Bruce Hunter is a 45 year old man who presents for hospital follow-up for ICU myopathy.   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  -Discussed  benefits of exercise in reducing pain. -Discussed following foods that may reduce pain: 1) Ginger (especially studied for arthritis)- reduce leukotriene production to decrease inflammation 2) Blueberries- high in phytonutrients that decrease inflammation 3) Salmon- marine omega-3s reduce joint swelling and pain 4) Pumpkin seeds- reduce inflammation 5) dark chocolate- reduces inflammation 6) turmeric- reduces inflammation 7) tart cherries - reduce pain and stiffness 8) extra virgin olive oil - its compound olecanthal helps to block prostaglandins  9) chili peppers- can be eaten or applied topically via capsaicin 10) mint- helpful for headache, muscle aches, joint pain, and itching 11) garlic- reduces inflammation  Link to further information on diet for chronic pain: http://www.randall.com/  -Discussed Qutenza as an option for neuropathic pain control. Discussed that this is a capsaicin patch, stronger than capsaicin cream. Discussed that it is currently approved for diabetic peripheral neuropathy and post-herpetic neuralgia, but that it has also shown benefit in treating other forms of neuropathy. Provided patient with link to site to learn more about the patch: CinemaBonus.fr. Discussed that the patch would be placed in office and benefits usually last 3 months. Discussed that unintended exposure to capsaicin can cause severe irritation of eyes, mucous membranes, respiratory tract, and skin, but that Qutenza is a local treatment and does not have the systemic side effects of other nerve medications. Discussed that there may be pain, itching, erythema, and decreased sensory function associated with the application of Qutenza. Side effects usually subside within 1 week. A cold pack of analgesic medications can help with these side effects. Blood pressure can also be increased due to pain associated with administration of the  patch.   4 patches of Qutenza was applied to the area of pain. Ice packs were applied during the procedure to ensure patient comfort. Blood pressure was monitored every 15 minutes. The patient tolerated the procedure well. Post-procedure instructions were given and follow-up has been scheduled.  Zillah [id number]  Continue oxycodone up 4 times per day.   -Provided with a Brochure for Spe[employer] Your Feet Contest  4) Bilateral lower extremity swelling -continue lasix M/W/F '40mg'$  -elevate legs as much as possible -continue to follow with nephrology  5) GERD -prescribed pepcid '40mg'$  daily PRN

## 2021-06-03 ENCOUNTER — Other Ambulatory Visit (HOSPITAL_COMMUNITY): Payer: Self-pay

## 2021-06-04 ENCOUNTER — Other Ambulatory Visit: Payer: Self-pay

## 2021-06-04 ENCOUNTER — Ambulatory Visit: Payer: Medicaid Other | Admitting: Physical Therapy

## 2021-06-04 ENCOUNTER — Encounter: Payer: Self-pay | Admitting: Physical Therapy

## 2021-06-04 DIAGNOSIS — R2681 Unsteadiness on feet: Secondary | ICD-10-CM

## 2021-06-04 DIAGNOSIS — R2689 Other abnormalities of gait and mobility: Secondary | ICD-10-CM

## 2021-06-04 DIAGNOSIS — M6281 Muscle weakness (generalized): Secondary | ICD-10-CM

## 2021-06-04 DIAGNOSIS — M79671 Pain in right foot: Secondary | ICD-10-CM

## 2021-06-04 DIAGNOSIS — F419 Anxiety disorder, unspecified: Secondary | ICD-10-CM | POA: Diagnosis not present

## 2021-06-04 DIAGNOSIS — M79672 Pain in left foot: Secondary | ICD-10-CM

## 2021-06-04 NOTE — Therapy (Signed)
[facility] [address] [place], [place], [address] Phone: [phone]   Fax:  [phone]  Physical Therapy Treatment  Patient Details  Name: Bruce Hunter MRN: [id number] Date of Birth: 1976-08-12 Referring Provider (PT): Courtney Heys, MD   Encounter Date: 06/04/2021   PT End of Session - 06/04/21 1241     Visit Number 6    Number of Visits 13    Authorization Type [employer]    Authorization Time Period 12 visits from 05/19/21-07/18/21    Authorization - Visit Number 5    Authorization - Number of Visits 12    PT Start Time 7858    PT Stop Time 1315    PT Time Calculation (min) 40 min    Equipment Utilized During Treatment Gait belt    Activity Tolerance Patient limited by pain;Patient tolerated treatment well;No increased pain    Behavior During Therapy [facility] for tasks assessed/performed             Past Medical History:  Diagnosis Date   Anxiety 05/07/2021   Asthma    Diabetes mellitus without complication ([place])    Gout    Hyperlipidemia    Hypertension    Obesity     Past Surgical History:  Procedure Laterality Date   BIOPSY  02/15/2021   Procedure: BIOPSY;  Surgeon: Ronnette Juniper, MD;  Location: [facility];  Service: Gastroenterology;;   COLONOSCOPY WITH PROPOFOL N/A 02/15/2021   Procedure: COLONOSCOPY WITH PROPOFOL;  Surgeon: Ronnette Juniper, MD;  Location: [facility];  Service: Gastroenterology;  Laterality: N/A;   ESOPHAGOGASTRODUODENOSCOPY (EGD) WITH PROPOFOL N/A 02/15/2021   Procedure: ESOPHAGOGASTRODUODENOSCOPY (EGD) WITH PROPOFOL;  Surgeon: Ronnette Juniper, MD;  Location: [facility];  Service: Gastroenterology;  Laterality: N/A;   GANGLION CYST EXCISION Right 02/26/2020   Procedure: EXCISION TOPHUS RIGHT FOOT;  Surgeon: Evelina Bucy, DPM;  Location: [facility];  Service: Podiatry;  Laterality: Right;   NO PAST SURGERIES      There were no vitals filed for this  visit.   Subjective Assessment - 06/04/21 1239     Subjective No new complaitns. No falls. Continues with bil foot swelling and pain. Did see Dr. Ranell Patrick and had a treatment in the office for neuropathic pain. Arrived using bil axillary crutches as pt drove himself here today since transportation did not have him on the schedule.    Pertinent History Hx of AIDS, DMII, gout, ICU myopathy    Limitations Sitting;Standing;Walking;House hold activities    How long can you stand comfortably? maybe one minute    How long can you walk comfortably? Isn't walking    Patient Stated Goals "I wanna walk.  I want to be able to return to golf."    Currently in Pain? Yes    Pain Score 10-Worst pain ever    Pain Location Foot    Pain Orientation Right;Left    Pain Descriptors / Indicators Aching;Burning;Throbbing    Pain Type Chronic pain    Pain Onset More than a month ago    Pain Frequency Constant    Aggravating Factors  feet in dependent position, increased activity    Pain Relieving Factors medication, rest                [facility] PT Assessment - 06/04/21 1242       Standardized Balance Assessment   Standardized Balance Assessment Berg Balance Test;Timed Up and Go Test      [facility] Balance Test  Sit to Stand Able to stand without using hands and stabilize independently    Standing Unsupported Able to stand safely 2 minutes    Sitting with Back Unsupported but Feet Supported on Floor or Stool Able to sit safely and securely 2 minutes    Stand to Sit Sits safely with minimal use of hands    Transfers Able to transfer safely, definite need of hands    Standing Unsupported with Eyes Closed Able to stand 10 seconds with supervision    Standing Unsupported with Feet Together Able to place feet together independently and stand for 1 minute with supervision    From Standing, Reach Forward with Outstretched Arm Can reach confidently >25 cm (10")    From Standing Position, Pick up Object from Floor  Able to pick up shoe, needs supervision    From Standing Position, Turn to Look Behind Over each Shoulder Looks behind from both sides and weight shifts well    Turn 360 Degrees Able to turn 360 degrees safely but slowly    Standing Unsupported, Alternately Place Feet on Step/Stool Able to stand independently and complete 8 steps >20 seconds    Standing Unsupported, One Foot in Front Able to plae foot ahead of the other independently and hold 30 seconds    Standing on One Leg Tries to lift leg/unable to hold 3 seconds but remains standing independently    Total Score 45    Berg comment: 45/56 37-45 significant risk for falls      Timed Up and Go Test   TUG Normal TUG    Normal TUG (seconds) 23.84   sec's with RW                   [facility] Adult PT Treatment/Exercise - 06/04/21 1242       Transfers   Transfers Sit to Stand;Stand to Sit    Sit to Stand 5: Supervision;With upper extremity assist;From bed;From chair/3-in-1    Stand to Sit 5: Supervision;With upper extremity assist;To bed;To chair/3-in-1      Ambulation/Gait   Ambulation/Gait Yes    Ambulation/Gait Assistance 5: Supervision    Ambulation/Gait Assistance Details pt used bil axillary crutches to enter/exit. use of RW in session with cues on upright posture,    Ambulation Distance (Feet) 115 Feet   x1, plus around clinic with session   Assistive device Rolling walker;Crutches    Gait Pattern Step-through pattern;Decreased stride length;Decreased hip/knee flexion - right;Decreased dorsiflexion - left;Decreased hip/knee flexion - left;Decreased dorsiflexion - right;Trunk flexed    Ambulation Surface Level;Indoor      Exercises   Exercises Other Exercises    Other Exercises  reports doing HEP everyday. "they are not doing anything for me though". Feels they are too easy. Will update as time allows this session vs next session.                       PT Short Term Goals - 06/04/21 2250       PT SHORT  TERM GOAL #1   Title Pt will be independent with initial HEP in order to indicate improved balance and functional mobility. (Target: 06/01/21)    Baseline 06/04/21: met with current HEP    Status Achieved    Target Date --      PT SHORT TERM GOAL #2   Title Pt will improve BERG balance score by 4 points from baseline in order to indicate decreased fall risk.  Baseline una9/30/22: 46/56 scored today (was 23/56)    Time 6    Period --    Status Achieved      PT SHORT TERM GOAL #3   Title Pt will perform TUG and goal to be updated to reflect progress and dec fall risk.    Baseline 06/04/21: 23.84 sec's with RW- PT to update LTGs    Time --    Period --    Status Achieved      PT SHORT TERM GOAL #4   Title Pt will ambulate x 100' nonstop with RW at S level in order to indicate improved independence with household mobility.    Baseline 150' at S level with RW on  05/25/2021    Time --    Period --    Status Achieved               PT Long Term Goals - 05/11/21 1347       PT LONG TERM GOAL #1   Title Pt will be independent with final HEP in order to indicate improved balance and functional mobility (Target:    Baseline dependent    Time 6    Period Weeks    Status New    Target Date 06/25/21      PT LONG TERM GOAL #2   Title Pt will improve BERG balance score by 8 points from baseline in order to indicate dec fall risk.    Baseline unable to finish due to time constraint    Time 6    Period Weeks    Status New      PT LONG TERM GOAL #3   Title Pt will ambulate 250' nonstop with RW at S level over indoor and paved outdoor surfaces in order to indicate improved limited community mobility.    Baseline 85' indoors with RW at min A    Time 6    Period Weeks    Status New      PT LONG TERM GOAL #4   Title LTG to be written for TUG once assessed in order to relfect dec fall risk.    Baseline unable to assess on eval    Time 6    Period Weeks    Status New      PT  LONG TERM GOAL #5   Title Pt will go up/down 2 small steps with LRAD at S level in order to indicate safe home entry/exit.    Baseline did not have time to assess on eval    Time 6    Period Weeks    Status New                   Plan - 06/04/21 1242     Clinical Impression Statement Today's skilled session focused on progress toward STG with all goals met. Set baseline for TUG this session with time of 23.84 secs with RW. PT to update goals. The pt is making steady progress despite still reporting 10/10 bil foot pain. The pt should benefit from continued PT to progress toward unmet goals.    Personal Factors and Comorbidities Comorbidity 3+;Time since onset of injury/illness/exacerbation    Comorbidities see above    Examination-Activity Limitations Carry;Lift;Locomotion Level;Transfers;Stairs;Stand    Examination-Participation Restrictions Community Activity;Driving;Laundry    Stability/Clinical Decision Making Evolving/Moderate complexity    Rehab Potential Good    PT Frequency 2x / week    PT Duration 6 weeks    PT Treatment/Interventions  ADLs/Self Care Home Management;Aquatic Therapy;DME Instruction;Gait training;Stair training;Functional mobility training;Therapeutic activities;Therapeutic exercise;Balance training;Neuromuscular re-education;Patient/family education;Passive range of motion;Dry needling;Energy conservation;Vestibular    PT Next Visit Plan continue to work on LE strengthening, transfers with emphasis on standing tolerance, gait with RW and stair training    PT Home Exercise Plan Access Code: [email or web address]    Consulted and Agree with Plan of Care Patient             Patient will benefit from skilled therapeutic intervention in order to improve the following deficits and impairments:  Abnormal gait, Cardiopulmonary status limiting activity, Decreased activity tolerance, Decreased balance, Decreased endurance, Decreased knowledge of use of DME, Decreased mobility,  Decreased strength, Difficulty walking, Increased edema, Impaired perceived functional ability, Impaired flexibility, Impaired sensation, Postural dysfunction, Pain, Obesity  Visit Diagnosis: Unsteadiness on feet  Muscle weakness (generalized)  Other abnormalities of gait and mobility  Pain in both feet     Problem List Patient Active Problem List   Diagnosis Date Noted   Anxiety 05/07/2021   Pain in joints of both feet    AKI (acute kidney injury) ([place])    SIRS (systemic inflammatory response syndrome) ([facility])    Leukocytosis    Bilateral swelling of feet    Debility 03/30/2021   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   Gout attack 03/30/2021   Acute left ankle pain    Acute pain of left shoulder    Altered mental status    Status post tracheostomy ([place])    Severe sepsis ([place])    Acute metabolic encephalopathy    Pressure injury of skin 03/09/2021   Septic shock ([place]) 03/08/2021   Acute respiratory failure with hypoxemia ([place]) 03/05/2021   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 ([place]) 03/05/2021   Elevated LFTs 03/05/2021   Multifocal pneumonia 03/04/2021   Candida esophagitis ([place]) 02/19/2021   Neuropathic pain 02/19/2021   Polypharmacy 02/19/2021   Cough    N&V (nausea and vomiting)    AIDS (acquired immune deficiency syndrome) ([place])    Esophageal dysphagia    Gastroesophageal reflux disease with esophagitis without hemorrhage    Gastrointestinal hemorrhage    Abdominal pain    Creatinine elevation    Hypoglycemia 02/12/2021   HTN (hypertension) 02/12/2021   Anemia 02/12/2021   Acute renal failure ([place]) 02/11/2021    Willow Ora, PTA, [facility] Outpatient Neuro [facility] [address], [place], [place] [address] [phone] 06/04/21, 10:58 PM   Name: KYEL PURK MRN: [id number] Date of Birth: 1976-08-24

## 2021-06-07 ENCOUNTER — Ambulatory Visit (INDEPENDENT_AMBULATORY_CARE_PROVIDER_SITE_OTHER): Payer: Medicaid Other | Admitting: Infectious Disease

## 2021-06-07 ENCOUNTER — Other Ambulatory Visit: Payer: Self-pay

## 2021-06-07 DIAGNOSIS — E1142 Type 2 diabetes mellitus with diabetic polyneuropathy: Secondary | ICD-10-CM | POA: Diagnosis not present

## 2021-06-07 DIAGNOSIS — B20 Human immunodeficiency virus [HIV] disease: Secondary | ICD-10-CM

## 2021-06-07 DIAGNOSIS — Z7185 Encounter for immunization safety counseling: Secondary | ICD-10-CM

## 2021-06-07 DIAGNOSIS — I10 Essential (primary) hypertension: Secondary | ICD-10-CM | POA: Diagnosis not present

## 2021-06-07 DIAGNOSIS — M7989 Other specified soft tissue disorders: Secondary | ICD-10-CM

## 2021-06-07 DIAGNOSIS — J9601 Acute respiratory failure with hypoxia: Secondary | ICD-10-CM

## 2021-06-07 NOTE — Progress Notes (Signed)
Chief complaint: Bilateral lower extremity edema that has worsened and is apparently painful and made it impossible for him to now stand  Patient ID: Bruce Hunter, male    DOB: [birth year], 45 y.o.   MRN: [id number]  HPI  Bruce Hunter is a 45 year old black man who was recently diagnosed with HIV and AIDS.  He recently had a retracted hospital stay from March 30, 2021 till the very end of August 2022.  Admitted with respiratory failure that required mechanical intubation and ventilation and ultimately underwent tracheostomy.  He completed a treatment course for PCP pneumonia though PCP was never proven he did have evidence of deteriorating few days during his stay at [facility] when he was off anti-PCP medications.  Developed septic physiology with shock and acute renal failure requiring CVVHD briefly.  Urgently was able to come off the ventilator and his renal function recovered completely.  His hospital course was further complicated by diffuse gout that required a corticosteroid taper.  He also was diagnosed with diabetes mellitus while in the hospital and now is on insulin.  He ultimately is discharged in the inpatient unit to inpatient rehab where he was further taken care of.  My partners Dr. Gale Journey and Baxter Flattery saw him again when he had recurrent fevers.  F AFB cultures were sent as well as testing for gonorrhea chlamydia CT chest abdomen pelvis.    He was started back on corticosteroids with resolution of his fevers.  He now finish steroids for gout and is having intermittent fevers again at times.  I suspect the fevers are due to IRIS.  He also has LE edema for which he is taking lasix.  When I last saw him he was too weak to walk around on his own.  Today when he came to clinic he was again wheelchair-bound and stated that he had been able to walk but the day he had such severe edema that was so painful that he was not able to get up to walk.  I offered him potential admission to the  hospital but he was not wanting to do this and want to go home to eat dinner.  He asked me to talk to his home health nurse who works with Dr Jonelle Sidle and I spoke to her over the telephone and she also agreed that the patient's edema had worsened.  She had been trying to draw blood work for Dr. Jonelle Sidle but the patient had refused she had then obtained records from the patient's nephrology visit which showed that his creatinine had abruptly worsened and increased to a value of 1.94 versus 1 of 1.48 that we had in our clinic from May 03, 2021.  He claims to be adherent to his antiretroviral medications though his most recent viral load in the 600s is not entirely consistent with this.      Past Medical History:  Diagnosis Date   Anxiety 05/07/2021   Asthma    Diabetes mellitus without complication ([place])    Gout    Hyperlipidemia    Hypertension    Obesity     Past Surgical History:  Procedure Laterality Date   BIOPSY  02/15/2021   Procedure: BIOPSY;  Surgeon: Ronnette Juniper, MD;  Location: [facility];  Service: Gastroenterology;;   COLONOSCOPY WITH PROPOFOL N/A 02/15/2021   Procedure: COLONOSCOPY WITH PROPOFOL;  Surgeon: Ronnette Juniper, MD;  Location[facility];  Service: Gastroenterology;  Laterality: N/A;   ESOPHAGOGASTRODUODENOSCOPY (EGD) WITH PROPOFOL N/A 02/15/2021   Procedure: ESOPHAGOGASTRODUODENOSCOPY (  EGD) WITH PROPOFOL;  Surgeon: Ronnette Juniper, MD;  [facility];  Service: Gastroenterology;  Laterality: N/A;   GANGLION CYST EXCISION Right 02/26/2020   Procedure: EXCISION TOPHUS RIGHT FOOT;  Surgeon: Evelina Bucy, DPM;  Location: [facility];  Service: Podiatry;  Laterality: Right;   NO PAST SURGERIES      Family History  Problem Relation Age of Onset   Asthma Mother    Cancer Mother    Diabetes Mother    Obesity Mother    Obesity Sister    Diabetes Brother    Obesity Brother    Cancer Maternal Grandmother    Diabetes Maternal Grandmother     Diabetes Other       Social History   Socioeconomic History   Marital status: Single    Spouse name: Not on file   Number of children: Not on file   Years of education: Not on file   Highest education level: Not on file  Occupational History   Not on file  Tobacco Use   Smoking status: Every Day    Packs/day: 0.25    Types: Cigarettes   Smokeless tobacco: Never  Substance and Sexual Activity   Alcohol use: Yes   Drug use: Yes    Frequency: 1.0 times per week    Types: Marijuana    Comment: marijuana at midnight last night; daily   Sexual activity: Yes  Other Topics Concern   Not on file  Social History Narrative   Not on file   Social Determinants of Health   Financial Resource Strain: Not on file  Food Insecurity: Not on file  Transportation Needs: Not on file  Physical Activity: Not on file  Stress: Not on file  Social Connections: Not on file    Allergies  Allergen Reactions   Lactose Intolerance (Gi)      Current Outpatient Medications:    acetaminophen (TYLENOL) 325 MG tablet, Take 1 tablet (325 mg total) by mouth every 4 (four) hours as needed for mild pain., Disp: 100 tablet, Rfl: 0   clonazePAM (KLONOPIN) 0.5 MG tablet, Take 1 tablet (0.5 mg total) by mouth 3 (three) times daily., Disp: 90 tablet, Rfl: 0   cyanocobalamin 1000 MCG tablet, Take 1 tablet (1,000 mcg total) by mouth daily., Disp: 100 tablet, Rfl: 2   dapsone 100 MG tablet, Take 1 tablet (100 mg total) by mouth daily. Restart on 04/03/2021, Disp: 30 tablet, Rfl: 11   diclofenac Sodium (VOLTAREN) 1 % GEL, Apply 2 g topically 4 (four) times daily., Disp: 300 g, Rfl: 0   Dolutegravir-lamiVUDine (DOVATO) 50-300 MG TABS, Take 1 tablet by mouth daily., Disp: 30 tablet, Rfl: 11   famotidine (PEPCID) 40 MG tablet, Take 1 tablet (40 mg total) by mouth daily., Disp: 30 tablet, Rfl: 3   furosemide (LASIX) 40 MG tablet, Take 1 tablet (40 mg total) by mouth every Monday, Wednesday, and Friday., Disp: 15  tablet, Rfl: 0   gabapentin (NEURONTIN) 300 MG capsule, Take 2 capsules (600 mg total) by mouth 3 (three) times daily., Disp: 180 capsule, Rfl: 0   hydrALAZINE (APRESOLINE) 25 MG tablet, Take 1 tablet (25 mg total) by mouth every 8 (eight) hours., Disp: 90 tablet, Rfl: 0   insulin glargine (LANTUS SOLOSTAR) 100 UNIT/ML Solostar Pen, Inject 5 Units into the skin daily., Disp: 15 mL, Rfl: 0   Insulin Pen Needle 32G X 4 MM MISC, use as directed, Disp: 100 each, Rfl: 0   lidocaine (LIDODERM)  5 %, Place 2 patches onto the skin daily. Remove & Discard patch within 12 hours or as directed by MD, Disp: 30 patch, Rfl: 0   magnesium oxide (MAG-OX) 400 MG tablet, Take 1 tablet (400 mg total) by mouth 2 (two) times daily., Disp: 60 tablet, Rfl: 0   methocarbamol (ROBAXIN) 500 MG tablet, Take 1 tablet (500 mg total) by mouth every 6 (six) hours as needed for muscle spasms., Disp: 120 tablet, Rfl: 0   metoprolol tartrate (LOPRESSOR) 50 MG tablet, Take 1 tablet (50 mg total) by mouth 2 (two) times daily., Disp: 60 tablet, Rfl: 0   morphine (MS CONTIN) 15 MG 12 hr tablet, Take 1 tablet (15 mg total) by mouth every 12 (twelve) hours. (Patient not taking: Reported on 05/11/2021), Disp: 10 tablet, Rfl: 0   Oxycodone HCl 10 MG TABS, Take 1 tablet (10 mg total) by mouth every 4 (four) hours as needed. MAX 5 TABS PER DAY AS NEEDED FOR SEVERE PAIN, Disp: 25 tablet, Rfl: 0   polyethylene glycol (MIRALAX / GLYCOLAX) 17 g packet, Take 17 g by mouth daily as needed for moderate constipation., Disp: , Rfl:    predniSONE (DELTASONE) 10 MG tablet, Take 3 tablets (30 mg total) by mouth daily with breakfast., Disp: 90 tablet, Rfl: 0   tamsulosin (FLOMAX) 0.4 MG CAPS capsule, Take 1 capsule (0.4 mg total) by mouth daily after supper., Disp: 30 capsule, Rfl: 0   Review of Systems  Constitutional:  Negative for activity change, appetite change, chills, diaphoresis, fatigue, fever and unexpected weight change.  HENT:  Negative for  congestion, rhinorrhea, sinus pressure, sneezing, sore throat and trouble swallowing.   Eyes:  Negative for photophobia and visual disturbance.  Respiratory:  Negative for cough, chest tightness, shortness of breath, wheezing and stridor.   Cardiovascular:  Positive for leg swelling. Negative for chest pain and palpitations.  Gastrointestinal:  Negative for abdominal distention, abdominal pain, anal bleeding, blood in stool, constipation, diarrhea, nausea and vomiting.  Genitourinary:  Negative for difficulty urinating, dysuria, flank pain and hematuria.  Musculoskeletal:  Positive for myalgias. Negative for arthralgias, back pain, gait problem and joint swelling.  Skin:  Negative for color change, pallor, rash and wound.  Neurological:  Positive for weakness. Negative for dizziness, tremors and light-headedness.  Hematological:  Negative for adenopathy. Does not bruise/bleed easily.  Psychiatric/Behavioral:  Negative for agitation, behavioral problems, confusion, decreased concentration, dysphoric mood and sleep disturbance.       Objective:   Physical Exam Constitutional:      Appearance: He is well-developed.  HENT:     Head: Normocephalic and atraumatic.  Eyes:     Conjunctiva/sclera: Conjunctivae normal.  Cardiovascular:     Rate and Rhythm: Normal rate and regular rhythm.  Pulmonary:     Effort: Pulmonary effort is normal. No respiratory distress.     Breath sounds: No wheezing.  Abdominal:     General: There is no distension.     Palpations: Abdomen is soft.  Musculoskeletal:        General: Normal range of motion.     Cervical back: Normal range of motion and neck supple.     Right lower leg: Edema present.     Left lower leg: Edema present.  Skin:    General: Skin is warm and dry.     Coloration: Skin is not pale.     Findings: No erythema or rash.  Neurological:     General: No focal deficit present.  Mental Status: He is alert and oriented to person, place, and  time.  Psychiatric:        Attention and Perception: Attention and perception normal.        Mood and Affect: Mood is depressed.        Behavior: Behavior normal.        Thought Content: Thought content normal.        Cognition and Memory: Cognition and memory normal.        Judgment: Judgment normal.          Assessment & Plan:   HIV disease:  I have emphasized the utmost importance of being adherent to his medications.  I have reviewed his viral load from May 07, 2021 which was 32 which is a dramatic increase from his CD4 count from June 13 which was undetectable  Worsening lower extremity edema:  I am rechecking a BMP today as well as a proBNP  I suspect much of this is due to his worsening renal failure and third spacing.  I suspect he needs increasing furosemide dose and conveyed this to his nurse that works with Dr. Jonelle Sidle.   Diabetes mellitus: On insulin and follows with Dr. Jonelle Sidle  Hypertension:  Blood pressure was in the upper 140s over 100 today in clinic.  Apparently nephrology had stopped his hydralazine and instituted an angiotensin receptor blocker.  He is going to need close follow-up by primary care and nephrology.  Chronic kidney disease: He had worsening of his creatinine based on labs from nephrology   PTSD: Taking clonazepam  Deconditioning: Following with physical therapy and rehab  Vaccine counseling: We gave him updated bivalent COVID19 booster

## 2021-06-08 ENCOUNTER — Encounter: Payer: Self-pay | Admitting: Physical Therapy

## 2021-06-08 ENCOUNTER — Ambulatory Visit: Payer: Medicaid Other | Admitting: Physical Therapy

## 2021-06-08 VITALS — BP 220/130

## 2021-06-08 DIAGNOSIS — R52 Pain, unspecified: Secondary | ICD-10-CM | POA: Insufficient documentation

## 2021-06-08 DIAGNOSIS — M79672 Pain in left foot: Secondary | ICD-10-CM | POA: Insufficient documentation

## 2021-06-08 DIAGNOSIS — R2689 Other abnormalities of gait and mobility: Secondary | ICD-10-CM

## 2021-06-08 DIAGNOSIS — R2681 Unsteadiness on feet: Secondary | ICD-10-CM | POA: Insufficient documentation

## 2021-06-08 DIAGNOSIS — M6281 Muscle weakness (generalized): Secondary | ICD-10-CM

## 2021-06-08 DIAGNOSIS — M79671 Pain in right foot: Secondary | ICD-10-CM

## 2021-06-08 NOTE — Therapy (Signed)
[facility] [address] [place], [place], [address] Phone: [phone]   Fax:  [phone]  Physical Therapy Treatment  Patient Details  Name: Bruce Hunter MRN: [id number] Date of Birth: 04-30-1976 Referring Provider (PT): Courtney Heys, MD   Encounter Date: 06/08/2021   PT End of Session - 06/08/21 1539     Visit Number 6   no charge on 10/4, visit number did not change   Number of Visits 13    Authorization Type [employer]    Authorization Time Period 12 visits from 05/19/21-07/18/21    Authorization - Visit Number 5    Authorization - Number of Visits 12    PT Start Time 7062    PT Stop Time 3762   no charge due to elevated  BP   PT Time Calculation (min) 17 min    Equipment Utilized During Treatment --    Activity Tolerance --    Behavior During Therapy [facility] for tasks assessed/performed             Past Medical History:  Diagnosis Date   Anxiety 05/07/2021   Asthma    Diabetes mellitus without complication ([place])    Gout    Hyperlipidemia    Hypertension    Obesity     Past Surgical History:  Procedure Laterality Date   BIOPSY  02/15/2021   Procedure: BIOPSY;  Surgeon: Ronnette Juniper, MD;  [facility];  Service: Gastroenterology;;   COLONOSCOPY WITH PROPOFOL N/A 02/15/2021   Procedure: COLONOSCOPY WITH PROPOFOL;  Surgeon: Ronnette Juniper, MD;  [facility];  Service: Gastroenterology;  Laterality: N/A;   ESOPHAGOGASTRODUODENOSCOPY (EGD) WITH PROPOFOL N/A 02/15/2021   Procedure: ESOPHAGOGASTRODUODENOSCOPY (EGD) WITH PROPOFOL;  Surgeon: Ronnette Juniper, MD;  [facility];  Service: Gastroenterology;  Laterality: N/A;   GANGLION CYST EXCISION Right 02/26/2020   Procedure: EXCISION TOPHUS RIGHT FOOT;  Surgeon: Evelina Bucy, DPM;  Location: [facility];  Service: Podiatry;  Laterality: Right;   NO PAST SURGERIES      Vitals:   06/08/21 1536 06/08/21 1554  BP:  (!) 220/120 (!) 220/130     Subjective Assessment - 06/08/21 1536     Subjective No new complaints. No falls. Continues with bil foot swelling and pain. Saw Dr. Drucilla Schmidt yesterday. Feels he is having increased renal issues causing him to retain more fluid. Also noted elevated diastolic BP in MD note.    Pertinent History Hx of AIDS, DMII, gout, ICU myopathy    Limitations Sitting;Standing;Walking;House hold activities    How long can you stand comfortably? maybe one minute    How long can you walk comfortably? Isn't walking    Patient Stated Goals "I wanna walk.  I want to be able to return to golf."    Currently in Pain? Yes    Pain Score 10-Worst pain ever    Pain Location Foot    Pain Orientation Right;Left    Pain Descriptors / Indicators Aching;Burning;Throbbing    Pain Type Chronic pain    Pain Radiating Towards up to ankles    Pain Onset More than a month ago    Pain Frequency Constant    Aggravating Factors  feet in dependent position, increased activity    Pain Relieving Factors medication, rest                      PT Short Term Goals - 06/04/21 2250  PT SHORT TERM GOAL #1   Title Pt will be independent with initial HEP in order to indicate improved balance and functional mobility. (Target: 06/01/21)    Baseline 06/04/21: met with current HEP    Status Achieved    Target Date --      PT SHORT TERM GOAL #2   Title Pt will improve BERG balance score by 4 points from baseline in order to indicate decreased fall risk.    Baseline una9/30/22: 46/56 scored today (was 23/56)    Time 6    Period --    Status Achieved      PT SHORT TERM GOAL #3   Title Pt will perform TUG and goal to be updated to reflect progress and dec fall risk.    Baseline 06/04/21: 23.84 sec's with RW- PT to update LTGs    Time --    Period --    Status Achieved      PT SHORT TERM GOAL #4   Title Pt will ambulate x 100' nonstop with RW at S level in order to indicate improved  independence with household mobility.    Baseline 150' at S level with RW on  05/25/2021    Time --    Period --    Status Achieved               PT Long Term Goals - 05/11/21 1347       PT LONG TERM GOAL #1   Title Pt will be independent with final HEP in order to indicate improved balance and functional mobility (Target:    Baseline dependent    Time 6    Period Weeks    Status New    Target Date 06/25/21      PT LONG TERM GOAL #2   Title Pt will improve BERG balance score by 8 points from baseline in order to indicate dec fall risk.    Baseline unable to finish due to time constraint    Time 6    Period Weeks    Status New      PT LONG TERM GOAL #3   Title Pt will ambulate 250' nonstop with RW at S level over indoor and paved outdoor surfaces in order to indicate improved limited community mobility.    Baseline 85' indoors with RW at min A    Time 6    Period Weeks    Status New      PT LONG TERM GOAL #4   Title LTG to be written for TUG once assessed in order to relfect dec fall risk.    Baseline unable to assess on eval    Time 6    Period Weeks    Status New      PT LONG TERM GOAL #5   Title Pt will go up/down 2 small steps with LRAD at S level in order to indicate safe home entry/exit.    Baseline did not have time to assess on eval    Time 6    Period Weeks    Status New                   Plan - 06/08/21 1540     Clinical Impression Statement Pt continues with elevated pain levels. Also noted in MD note pt had elevated BP yesterday. Upon checking BP this session was found to have significantly elevated BP. Pt called caregiver who reminded him of need to pick up his Metoprolol,  which he did not take today. Session ended due to BP readings with pt to meet caregiver at home who is getting his meds (pt takes transportation).    Personal Factors and Comorbidities Comorbidity 3+;Time since onset of injury/illness/exacerbation    Comorbidities see  above    Examination-Activity Limitations Carry;Lift;Locomotion Level;Transfers;Stairs;Stand    Examination-Participation Restrictions Community Activity;Driving;Laundry    Stability/Clinical Decision Making Evolving/Moderate complexity    Rehab Potential Good    PT Frequency 2x / week    PT Duration 6 weeks    PT Treatment/Interventions ADLs/Self Care Home Management;Aquatic Therapy;DME Instruction;Gait training;Stair training;Functional mobility training;Therapeutic activities;Therapeutic exercise;Balance training;Neuromuscular re-education;Patient/family education;Passive range of motion;Dry needling;Energy conservation;Vestibular    PT Next Visit Plan continue to work on LE strengthening, transfers with emphasis on standing tolerance, gait with RW and stair training    PT Home Exercise Plan Access Code: [email or web address]    Consulted and Agree with Plan of Care Patient             Patient will benefit from skilled therapeutic intervention in order to improve the following deficits and impairments:  Abnormal gait, Cardiopulmonary status limiting activity, Decreased activity tolerance, Decreased balance, Decreased endurance, Decreased knowledge of use of DME, Decreased mobility, Decreased strength, Difficulty walking, Increased edema, Impaired perceived functional ability, Impaired flexibility, Impaired sensation, Postural dysfunction, Pain, Obesity  Visit Diagnosis: Muscle weakness (generalized)  Other abnormalities of gait and mobility  Pain in both feet     Problem List Patient Active Problem List   Diagnosis Date Noted   Anxiety 05/07/2021   Pain in joints of both feet    AKI (acute kidney injury) ([place])    SIRS (systemic inflammatory response syndrome) ([facility])    Leukocytosis    Bilateral swelling of feet    Debility 03/30/2021   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   Gout attack 03/30/2021   Acute left ankle pain    Acute pain of left shoulder    Altered mental status     Status post tracheostomy ([place])    Severe sepsis ([place])    Acute metabolic encephalopathy    Pressure injury of skin 03/09/2021   Septic shock ([place]) 03/08/2021   Acute respiratory failure with hypoxemia ([place]) 03/05/2021   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 ([place]) 03/05/2021   Elevated LFTs 03/05/2021   Multifocal pneumonia 03/04/2021   Candida esophagitis ([place]) 02/19/2021   Neuropathic pain 02/19/2021   Polypharmacy 02/19/2021   Cough    N&V (nausea and vomiting)    AIDS (acquired immune deficiency syndrome) ([place])    Esophageal dysphagia    Gastroesophageal reflux disease with esophagitis without hemorrhage    Gastrointestinal hemorrhage    Abdominal pain    Creatinine elevation    Hypoglycemia 02/12/2021   HTN (hypertension) 02/12/2021   Anemia 02/12/2021   Acute renal failure ([place]) 02/11/2021    Willow Ora, PTA 06/08/2021, 3:58 PM  [facility] [address] [place], [place], [address] Phone: [phone]   Fax:  [phone]  Name: Bruce Hunter MRN: [id number] Date of Birth: 12-22-1975

## 2021-06-09 LAB — HELPER T-LYMPH-CD4 (ARMC ONLY)
% CD 4 Pos. Lymph.: 14.5 % — ABNORMAL LOW (ref 30.8–58.5)
Absolute CD 4 Helper: 58 /uL — ABNORMAL LOW (ref 359–1519)
Basophils Absolute: 0 10*3/uL (ref 0.0–0.2)
Basos: 0 %
EOS (ABSOLUTE): 0 10*3/uL (ref 0.0–0.4)
Eos: 0 %
Hematocrit: 33.4 % — ABNORMAL LOW (ref 37.5–51.0)
Hemoglobin: 10.3 g/dL — ABNORMAL LOW (ref 13.0–17.7)
Immature Grans (Abs): 0.4 10*3/uL — ABNORMAL HIGH (ref 0.0–0.1)
Immature Granulocytes: 3 %
Lymphocytes Absolute: 0.4 10*3/uL — ABNORMAL LOW (ref 0.7–3.1)
Lymphs: 3 %
MCH: 29.9 pg (ref 26.6–33.0)
MCHC: 30.8 g/dL — ABNORMAL LOW (ref 31.5–35.7)
MCV: 97 fL (ref 79–97)
Monocytes Absolute: 0.6 10*3/uL (ref 0.1–0.9)
Monocytes: 5 %
Neutrophils Absolute: 12 10*3/uL — ABNORMAL HIGH (ref 1.4–7.0)
Neutrophils: 89 %
Platelets: 240 10*3/uL (ref 150–450)
RBC: 3.44 x10E6/uL — ABNORMAL LOW (ref 4.14–5.80)
RDW: 15.2 % (ref 11.6–15.4)
WBC: 13.5 10*3/uL — ABNORMAL HIGH (ref 3.4–10.8)

## 2021-06-09 LAB — COMPLETE METABOLIC PANEL WITH GFR
AG Ratio: 1.3 (calc) (ref 1.0–2.5)
ALT: 21 U/L (ref 9–46)
AST: 13 U/L (ref 10–40)
Albumin: 3.8 g/dL (ref 3.6–5.1)
Alkaline phosphatase (APISO): 67 U/L (ref 36–130)
BUN/Creatinine Ratio: 20 (calc) (ref 6–22)
BUN: 33 mg/dL — ABNORMAL HIGH (ref 7–25)
CO2: 26 mmol/L (ref 20–32)
Calcium: 9.1 mg/dL (ref 8.6–10.3)
Chloride: 103 mmol/L (ref 98–110)
Creat: 1.64 mg/dL — ABNORMAL HIGH (ref 0.60–1.29)
Globulin: 2.9 g/dL (calc) (ref 1.9–3.7)
Glucose, Bld: 205 mg/dL — ABNORMAL HIGH (ref 65–99)
Potassium: 4.9 mmol/L (ref 3.5–5.3)
Sodium: 138 mmol/L (ref 135–146)
Total Bilirubin: 0.3 mg/dL (ref 0.2–1.2)
Total Protein: 6.7 g/dL (ref 6.1–8.1)
eGFR: 52 mL/min/{1.73_m2} — ABNORMAL LOW (ref >=60)

## 2021-06-09 LAB — CBC WITH DIFFERENTIAL/PLATELET
Absolute Monocytes: 716 cells/uL (ref 200–950)
Basophils Absolute: 14 cells/uL (ref 0–200)
Basophils Relative: 0.1 %
Eosinophils Absolute: 14 cells/uL — ABNORMAL LOW (ref 15–500)
Eosinophils Relative: 0.1 %
HCT: 32.4 % — ABNORMAL LOW (ref 38.5–50.0)
Hemoglobin: 10.3 g/dL — ABNORMAL LOW (ref 13.2–17.1)
Lymphs Abs: 419 cells/uL — ABNORMAL LOW (ref 850–3900)
MCH: 30 pg (ref 27.0–33.0)
MCHC: 31.8 g/dL — ABNORMAL LOW (ref 32.0–36.0)
MCV: 94.5 fL (ref 80.0–100.0)
MPV: 10.8 fL (ref 7.5–12.5)
Monocytes Relative: 5.3 %
Neutro Abs: 12339 cells/uL — ABNORMAL HIGH (ref 1500–7800)
Neutrophils Relative %: 91.4 %
Platelets: 240 10*3/uL (ref 140–400)
RBC: 3.43 10*6/uL — ABNORMAL LOW (ref 4.20–5.80)
RDW: 15.3 % — ABNORMAL HIGH (ref 11.0–15.0)
Total Lymphocyte: 3.1 %
WBC: 13.5 10*3/uL — ABNORMAL HIGH (ref 3.8–10.8)

## 2021-06-09 LAB — RPR: RPR Ser Ql: NONREACTIVE

## 2021-06-09 LAB — TSH+FREE T4: TSH W/REFLEX TO FT4: 0.64 mIU/L (ref 0.40–4.50)

## 2021-06-09 LAB — NT-PROBNP: Pro B Natriuretic peptide (BNP): 1038 pg/mL — ABNORMAL HIGH

## 2021-06-09 LAB — HIV-1 RNA QUANT-NO REFLEX-BLD
HIV 1 RNA Quant: 161 Copies/mL — ABNORMAL HIGH
HIV-1 RNA Quant, Log: 2.21 Log cps/mL — ABNORMAL HIGH

## 2021-06-10 ENCOUNTER — Ambulatory Visit: Payer: Medicaid Other | Attending: Physical Medicine and Rehabilitation

## 2021-06-10 ENCOUNTER — Other Ambulatory Visit (HOSPITAL_COMMUNITY): Payer: Self-pay

## 2021-06-10 ENCOUNTER — Other Ambulatory Visit: Payer: Self-pay

## 2021-06-10 DIAGNOSIS — R52 Pain, unspecified: Secondary | ICD-10-CM | POA: Diagnosis present

## 2021-06-10 DIAGNOSIS — R2689 Other abnormalities of gait and mobility: Secondary | ICD-10-CM | POA: Diagnosis present

## 2021-06-10 DIAGNOSIS — M79672 Pain in left foot: Secondary | ICD-10-CM

## 2021-06-10 DIAGNOSIS — M79671 Pain in right foot: Secondary | ICD-10-CM

## 2021-06-10 DIAGNOSIS — M6281 Muscle weakness (generalized): Secondary | ICD-10-CM | POA: Diagnosis present

## 2021-06-10 DIAGNOSIS — R2681 Unsteadiness on feet: Secondary | ICD-10-CM | POA: Diagnosis present

## 2021-06-10 NOTE — Therapy (Signed)
[facility] [address] [place], [place], [address] Phone: [phone]   Fax:  [phone]  Physical Therapy Treatment  Patient Details  Name: Bruce Hunter MRN: [id number] Date of Birth: 12/22/1975 Referring Provider (PT): Courtney Heys, MD   Encounter Date: 06/10/2021   PT End of Session - 06/10/21 1159     Visit Number 7    Number of Visits 13    Authorization Type [employer]    Authorization Time Period 12 visits from 05/19/21-07/18/21    Authorization - Visit Number 6    Authorization - Number of Visits 12    PT Start Time 1150    PT Stop Time 1791    PT Time Calculation (min) 45 min    Behavior During Therapy [facility] for tasks assessed/performed             Past Medical History:  Diagnosis Date   Anxiety 05/07/2021   Asthma    Diabetes mellitus without complication ([place])    Gout    Hyperlipidemia    Hypertension    Obesity     Past Surgical History:  Procedure Laterality Date   BIOPSY  02/15/2021   Procedure: BIOPSY;  Surgeon: Ronnette Juniper, MD;  Location: [facility];  Service: Gastroenterology;;   COLONOSCOPY WITH PROPOFOL N/A 02/15/2021   Procedure: COLONOSCOPY WITH PROPOFOL;  Surgeon: Ronnette Juniper, MD;  [facility];  Service: Gastroenterology;  Laterality: N/A;   ESOPHAGOGASTRODUODENOSCOPY (EGD) WITH PROPOFOL N/A 02/15/2021   Procedure: ESOPHAGOGASTRODUODENOSCOPY (EGD) WITH PROPOFOL;  Surgeon: Ronnette Juniper, MD;  [facility];  Service: Gastroenterology;  Laterality: N/A;   GANGLION CYST EXCISION Right 02/26/2020   Procedure: EXCISION TOPHUS RIGHT FOOT;  Surgeon: Evelina Bucy, DPM;  Location: [facility];  Service: Podiatry;  Laterality: Right;   NO PAST SURGERIES      There were no vitals filed for this visit.   Subjective Assessment - 06/10/21 1159     Subjective Pt reports legs are swollena nd they hurt 11/10    Pertinent History Hx of AIDS, DMII,  gout, ICU myopathy    Limitations Sitting;Standing;Walking;House hold activities    How long can you stand comfortably? maybe one minute    How long can you walk comfortably? Isn't walking    Patient Stated Goals "I wanna walk.  I want to be able to return to golf."    Currently in Pain? Yes    Pain Score 10-Worst pain ever    Pain Location Leg    Pain Orientation Right;Left    Pain Type Neuropathic pain    Pain Onset More than a month ago              Gait training: 2 x 115' with wheelchair follow and CGA, pt fatigued and needed rest and encouragement to increase walking distance Sit to stand from low mat table: no HHA: 10x Gait training: TUG performed 1x without AD (pt carried cane in his hand as he didn't want to use it) for 2nd and 3rd trial, pt encouraged to use the cane to help him support legs better. Gait training: 1 x 115' with st. Cane and wheelchair follow                            PT Short Term Goals - 06/04/21 2250       PT SHORT TERM GOAL #1   Title Pt will be independent  with initial HEP in order to indicate improved balance and functional mobility. (Target: 06/01/21)    Baseline 06/04/21: met with current HEP    Status Achieved    Target Date --      PT SHORT TERM GOAL #2   Title Pt will improve BERG balance score by 4 points from baseline in order to indicate decreased fall risk.    Baseline una9/30/22: 46/56 scored today (was 23/56)    Time 6    Period --    Status Achieved      PT SHORT TERM GOAL #3   Title Pt will perform TUG and goal to be updated to reflect progress and dec fall risk.    Baseline 06/04/21: 23.84 sec's with RW- PT to update LTGs    Time --    Period --    Status Achieved      PT SHORT TERM GOAL #4   Title Pt will ambulate x 100' nonstop with RW at S level in order to indicate improved independence with household mobility.    Baseline 150' at S level with RW on  05/25/2021    Time --    Period --    Status  Achieved               PT Long Term Goals - 05/11/21 1347       PT LONG TERM GOAL #1   Title Pt will be independent with final HEP in order to indicate improved balance and functional mobility (Target:    Baseline dependent    Time 6    Period Weeks    Status New    Target Date 06/25/21      PT LONG TERM GOAL #2   Title Pt will improve BERG balance score by 8 points from baseline in order to indicate dec fall risk.    Baseline unable to finish due to time constraint    Time 6    Period Weeks    Status New      PT LONG TERM GOAL #3   Title Pt will ambulate 250' nonstop with RW at S level over indoor and paved outdoor surfaces in order to indicate improved limited community mobility.    Baseline 85' indoors with RW at min A    Time 6    Period Weeks    Status New      PT LONG TERM GOAL #4   Title LTG to be written for TUG once assessed in order to relfect dec fall risk.    Baseline unable to assess on eval    Time 6    Period Weeks    Status New      PT LONG TERM GOAL #5   Title Pt will go up/down 2 small steps with LRAD at S level in order to indicate safe home entry/exit.    Baseline did not have time to assess on eval    Time 6    Period Weeks    Status New                06/10/21 1308  Plan  Clinical Impression Statement Todays skilled session was focused on progressing gait training. Pt progressed to st. cane for ambulation and did well.  Personal Factors and Comorbidities Comorbidity 3+;Time since onset of injury/illness/exacerbation  Comorbidities see above  Examination-Activity Limitations Carry;Lift;Locomotion Level;Transfers;Stairs;Stand  Examination-Participation Restrictions Community Activity;Driving;Laundry  Pt will benefit from skilled therapeutic intervention in order to improve on the following  deficits Abnormal gait;Cardiopulmonary status limiting activity;Decreased activity tolerance;Decreased balance;Decreased endurance;Decreased  knowledge of use of DME;Decreased mobility;Decreased strength;Difficulty walking;Increased edema;Impaired perceived functional ability;Impaired flexibility;Impaired sensation;Postural dysfunction;Pain;Obesity  Stability/Clinical Decision Making Evolving/Moderate complexity  Rehab Potential Good  PT Frequency 2x / week  PT Duration 6 weeks  PT Treatment/Interventions ADLs/Self Care Home Management;Aquatic Therapy;DME Instruction;Gait training;Stair training;Functional mobility training;Therapeutic activities;Therapeutic exercise;Balance training;Neuromuscular re-education;Patient/family education;Passive range of motion;Dry needling;Energy conservation;Vestibular  PT Next Visit Plan continue to work on LE strengthening, transfers with emphasis on standing tolerance, gait with RW and stair training  PT Home Exercise Plan Access Code: [email or web address]  Consulted and Agree with Plan of Care Patient        Patient will benefit from skilled therapeutic intervention in order to improve the following deficits and impairments:     Visit Diagnosis: Muscle weakness (generalized)  Other abnormalities of gait and mobility  Pain in both feet  Unsteadiness on feet     Problem List Patient Active Problem List   Diagnosis Date Noted   Anxiety 05/07/2021   Pain in joints of both feet    AKI (acute kidney injury) ([place])    SIRS (systemic inflammatory response syndrome) ([facility])    Leukocytosis    Bilateral swelling of feet    Debility 03/30/2021   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   Gout attack 03/30/2021   Acute left ankle pain    Acute pain of left shoulder    Altered mental status    Status post tracheostomy ([place])    Severe sepsis ([place])    Acute metabolic encephalopathy    Pressure injury of skin 03/09/2021   Septic shock ([place]) 03/08/2021   Acute respiratory failure with hypoxemia ([place]) 03/05/2021   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 ([place]) 03/05/2021   Elevated LFTs 03/05/2021   Multifocal pneumonia 03/04/2021   Candida esophagitis ([place]) 02/19/2021   Neuropathic pain 02/19/2021   Polypharmacy 02/19/2021   Cough    N&V (nausea and vomiting)    AIDS (acquired immune deficiency syndrome) ([place])    Esophageal dysphagia    Gastroesophageal reflux disease with esophagitis without hemorrhage    Gastrointestinal hemorrhage    Abdominal pain    Creatinine elevation    Hypoglycemia 02/12/2021   HTN (hypertension) 02/12/2021   Anemia 02/12/2021   Acute renal failure ([place]) 02/11/2021    Kerrie Pleasure, PT 06/10/2021, 12:11 PM  [facility] [address] [place], [place], [address] Phone: [phone]   Fax:  [phone]  Name: Bruce Hunter MRN: [id number] Date of Birth: 18-Aug-1976

## 2021-06-14 LAB — MISC LABCORP TEST (SEND OUT): Labcorp test code: 183764

## 2021-06-15 ENCOUNTER — Encounter: Payer: Self-pay | Admitting: Rehabilitation

## 2021-06-15 ENCOUNTER — Ambulatory Visit: Payer: Medicaid Other | Admitting: Rehabilitation

## 2021-06-15 ENCOUNTER — Other Ambulatory Visit: Payer: Self-pay

## 2021-06-15 DIAGNOSIS — R2681 Unsteadiness on feet: Secondary | ICD-10-CM

## 2021-06-15 DIAGNOSIS — M6281 Muscle weakness (generalized): Secondary | ICD-10-CM | POA: Diagnosis not present

## 2021-06-15 DIAGNOSIS — R2689 Other abnormalities of gait and mobility: Secondary | ICD-10-CM

## 2021-06-15 NOTE — Therapy (Signed)
[facility] [address] [place], [place], [address] Phone: [phone]   Fax:  [phone]  Physical Therapy Treatment  Patient Details  Name: Bruce Hunter MRN: [id number] Date of Birth: 15-May-1976 Referring Provider (PT): Courtney Heys, MD   Encounter Date: 06/15/2021   PT End of Session - 06/15/21 1830     Visit Number 8    Number of Visits 13    Authorization Type [employer]    Authorization Time Period 12 visits from 05/19/21-07/18/21    Authorization - Visit Number 7    Authorization - Number of Visits 12    PT Start Time 7494    PT Stop Time 4967    PT Time Calculation (min) 42 min    Activity Tolerance Patient limited by pain;Patient tolerated treatment well;No increased pain    Behavior During Therapy [facility] for tasks assessed/performed             Past Medical History:  Diagnosis Date   Anxiety 05/07/2021   Asthma    Diabetes mellitus without complication ([place])    Gout    Hyperlipidemia    Hypertension    Obesity     Past Surgical History:  Procedure Laterality Date   BIOPSY  02/15/2021   Procedure: BIOPSY;  Surgeon: Ronnette Juniper, MD;  [facility];  Service: Gastroenterology;;   COLONOSCOPY WITH PROPOFOL N/A 02/15/2021   Procedure: COLONOSCOPY WITH PROPOFOL;  Surgeon: Ronnette Juniper, MD;  [facility];  Service: Gastroenterology;  Laterality: N/A;   ESOPHAGOGASTRODUODENOSCOPY (EGD) WITH PROPOFOL N/A 02/15/2021   Procedure: ESOPHAGOGASTRODUODENOSCOPY (EGD) WITH PROPOFOL;  Surgeon: Ronnette Juniper, MD;  [facility];  Service: Gastroenterology;  Laterality: N/A;   GANGLION CYST EXCISION Right 02/26/2020   Procedure: EXCISION TOPHUS RIGHT FOOT;  Surgeon: Evelina Bucy, DPM;  Location: [facility];  Service: Podiatry;  Laterality: Right;   NO PAST SURGERIES      There were no vitals filed for this visit.   Subjective Assessment - 06/15/21 1325      Subjective Legs continue to be swollen and painful.  Should be getting compression socks in mail.    Pertinent History Hx of AIDS, DMII, gout, ICU myopathy    Limitations Sitting;Standing;Walking;House hold activities    How long can you stand comfortably? maybe one minute    How long can you walk comfortably? Isn't walking    Patient Stated Goals "I wanna walk.  I want to be able to return to golf."    Currently in Pain? Yes    Pain Score 10-Worst pain ever    Pain Location Leg    Pain Orientation Right;Left    Pain Descriptors / Indicators Aching;Burning    Pain Type Chronic pain    Pain Onset More than a month ago    Pain Frequency Constant    Aggravating Factors  feet in dependent position, increased    Pain Relieving Factors medication, rest                               [facility] Adult PT Treatment/Exercise - 06/15/21 1326       Transfers   Transfers Sit to Stand;Stand to Sit    Sit to Stand 6: Modified independent (Device/Increase time)    Stand to Sit 6: Modified independent (Device/Increase time)      Ambulation/Gait   Ambulation/Gait Yes    Ambulation/Gait Assistance 4: Min  guard    Ambulation/Gait Assistance Details Min/guard with SPC x 115' with cues for sequening and larger step length bilaterally. Continue to encourage him to walk all the way around x 1 lap    Ambulation Distance (Feet) 115 Feet   30-40' x 2 within session to/from // bars   Assistive device Straight cane    Gait Pattern Step-through pattern;Decreased stride length;Decreased hip/knee flexion - right;Decreased dorsiflexion - left;Decreased hip/knee flexion - left;Decreased dorsiflexion - right;Trunk flexed    Ambulation Surface Level;Indoor    Stairs Yes    Stairs Assistance 5: Supervision;4: Min guard    Stairs Assistance Details (indicate cue type and reason) S to min/guard for safety with cues for safe foot placement.  Performed for LE strengthening and activity tolerance.    Stair  Management Technique Two rails;Alternating pattern;Step to pattern;Forwards    Number of Stairs 4   x 3 reps   Height of Stairs 6      Self-Care   Self-Care Other Self-Care Comments    Other Self-Care Comments  Lengthy discussion with pt regarding carry over at home.  He is utilizing w/c for the most part at home and there really is no need from a mobility stand point for this.  Encouraged pt to dec w/c use at home and use SPC around home.  Also encouraged him to get up and walk around a little every hour to improve strength, endurance and help with reducing LE swelling.  Continue to educate on importance of mobility in decreasing swelling and likely helping with pain.  Pt verbalized understanding.      Neuro Re-ed    Neuro Re-ed Details  In // bars, marching on foam airex x 20 reps, forward stepping alt LEs x 20 reps with no/very little UE support.      Exercises   Other Exercises  Forward step ups x 10 reps (alt which foot steps up) then lateral step up/downs x 10 reps, cues for wider step.  Both with single UE support only      Lumbar Exercises: Aerobic   Other Aerobic Exercise Scifit UE/LE's level 1.5 x 4 minutes, 2.5 resistance x 4 mins with goal >/= 60 steps per minute for strengthening and activity tolerance.                       PT Short Term Goals - 06/04/21 2250       PT SHORT TERM GOAL #1   Title Pt will be independent with initial HEP in order to indicate improved balance and functional mobility. (Target: 06/01/21)    Baseline 06/04/21: met with current HEP    Status Achieved    Target Date --      PT SHORT TERM GOAL #2   Title Pt will improve BERG balance score by 4 points from baseline in order to indicate decreased fall risk.    Baseline una9/30/22: 46/56 scored today (was 23/56)    Time 6    Period --    Status Achieved      PT SHORT TERM GOAL #3   Title Pt will perform TUG and goal to be updated to reflect progress and dec fall risk.    Baseline  06/04/21: 23.84 sec's with RW- PT to update LTGs    Time --    Period --    Status Achieved      PT SHORT TERM GOAL #4   Title Pt will ambulate x 100' nonstop with  RW at S level in order to indicate improved independence with household mobility.    Baseline 150' at S level with RW on  05/25/2021    Time --    Period --    Status Achieved               PT Long Term Goals - 05/11/21 1347       PT LONG TERM GOAL #1   Title Pt will be independent with final HEP in order to indicate improved balance and functional mobility (Target:    Baseline dependent    Time 6    Period Weeks    Status New    Target Date 06/25/21      PT LONG TERM GOAL #2   Title Pt will improve BERG balance score by 8 points from baseline in order to indicate dec fall risk.    Baseline unable to finish due to time constraint    Time 6    Period Weeks    Status New      PT LONG TERM GOAL #3   Title Pt will ambulate 250' nonstop with RW at S level over indoor and paved outdoor surfaces in order to indicate improved limited community mobility.    Baseline 85' indoors with RW at min A    Time 6    Period Weeks    Status New      PT LONG TERM GOAL #4   Title LTG to be written for TUG once assessed in order to relfect dec fall risk.    Baseline unable to assess on eval    Time 6    Period Weeks    Status New      PT LONG TERM GOAL #5   Title Pt will go up/down 2 small steps with LRAD at S level in order to indicate safe home entry/exit.    Baseline did not have time to assess on eval    Time 6    Period Weeks    Status New                   Plan - 06/15/21 1831     Clinical Impression Statement Pt continues to verbalize pain in B ankles and LEs, however does very well from a balance and mobility standpoint.  He is now safe to use cane in home (he reports he does have cane at home) to work on reducing use of w/c and improving overall mobility.  Pt needs max encouragement from PT throughout  entire session.    Personal Factors and Comorbidities Comorbidity 3+;Time since onset of injury/illness/exacerbation    Comorbidities see above    Examination-Activity Limitations Carry;Lift;Locomotion Level;Transfers;Stairs;Stand    Examination-Participation Restrictions Community Activity;Driving;Laundry    Stability/Clinical Decision Making Evolving/Moderate complexity    Rehab Potential Good    PT Frequency 2x / week    PT Duration 6 weeks    PT Treatment/Interventions ADLs/Self Care Home Management;Aquatic Therapy;DME Instruction;Gait training;Stair training;Functional mobility training;Therapeutic activities;Therapeutic exercise;Balance training;Neuromuscular re-education;Patient/family education;Passive range of motion;Dry needling;Energy conservation;Vestibular    PT Next Visit Plan continue to work on LE strengthening,standing tolerance, gait with cane and stair training    PT Home Exercise Plan Access Code: [email or web address]    Consulted and Agree with Plan of Care Patient             Patient will benefit from skilled therapeutic intervention in order to improve the following deficits and impairments:  Abnormal gait, Cardiopulmonary status  limiting activity, Decreased activity tolerance, Decreased balance, Decreased endurance, Decreased knowledge of use of DME, Decreased mobility, Decreased strength, Difficulty walking, Increased edema, Impaired perceived functional ability, Impaired flexibility, Impaired sensation, Postural dysfunction, Pain, Obesity  Visit Diagnosis: Muscle weakness (generalized)  Other abnormalities of gait and mobility  Unsteadiness on feet     Problem List Patient Active Problem List   Diagnosis Date Noted   Anxiety 05/07/2021   Pain in joints of both feet    AKI (acute kidney injury) ([place])    SIRS (systemic inflammatory response syndrome) ([facility])    Leukocytosis    Bilateral swelling of feet    Debility 03/30/2021   Intensive care (ICU) myopathy  03/30/2021   Gout attack 03/30/2021   Acute left ankle pain    Acute pain of left shoulder    Altered mental status    Status post tracheostomy ([place])    Severe sepsis ([place])    Acute metabolic encephalopathy    Pressure injury of skin 03/09/2021   Septic shock ([place]) 03/08/2021   Acute respiratory failure with hypoxemia ([place]) 03/05/2021   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 ([place]) 03/05/2021   Elevated LFTs 03/05/2021   Multifocal pneumonia 03/04/2021   Candida esophagitis ([place]) 02/19/2021   Neuropathic pain 02/19/2021   Polypharmacy 02/19/2021   Cough    N&V (nausea and vomiting)    AIDS (acquired immune deficiency syndrome) ([place])    Esophageal dysphagia    Gastroesophageal reflux disease with esophagitis without hemorrhage    Gastrointestinal hemorrhage    Abdominal pain    Creatinine elevation    Hypoglycemia 02/12/2021   HTN (hypertension) 02/12/2021   Anemia 02/12/2021   Acute renal failure ([place]) 02/11/2021    Cameron Sprang, PT, MPT [facility] [address] [place], [place], [address] Phone: [phone]   Fax:  [phone] 06/15/21, 6:35 PM   Name: ASAF ELMQUIST MRN: [id number] Date of Birth: 05-04-1976

## 2021-06-17 ENCOUNTER — Ambulatory Visit: Payer: Medicaid Other | Admitting: Rehabilitation

## 2021-06-18 ENCOUNTER — Ambulatory Visit: Payer: Medicaid Other

## 2021-06-18 ENCOUNTER — Other Ambulatory Visit: Payer: Self-pay

## 2021-06-18 DIAGNOSIS — M6281 Muscle weakness (generalized): Secondary | ICD-10-CM | POA: Diagnosis not present

## 2021-06-18 DIAGNOSIS — R2681 Unsteadiness on feet: Secondary | ICD-10-CM

## 2021-06-18 DIAGNOSIS — R2689 Other abnormalities of gait and mobility: Secondary | ICD-10-CM

## 2021-06-18 NOTE — Therapy (Signed)
[facility] [address] [place], [place], [address] Phone: [phone]   Fax:  [phone]  Physical Therapy Treatment  Patient Details  Name: Bruce Hunter MRN: [id number] Date of Birth: December 07, 1975 Referring Provider (PT): Courtney Heys, MD   Encounter Date: 06/18/2021   PT End of Session - 06/18/21 1537     Visit Number 9    Number of Visits 13    Authorization Type [employer]    Authorization Time Period 12 visits from 05/19/21-07/18/21    Authorization - Visit Number 8    Authorization - Number of Visits 12    PT Start Time 3428    PT Stop Time 7681    PT Time Calculation (min) 41 min    Activity Tolerance Patient limited by pain;Patient tolerated treatment well;No increased pain    Behavior During Therapy [facility] for tasks assessed/performed             Past Medical History:  Diagnosis Date   Anxiety 05/07/2021   Asthma    Diabetes mellitus without complication ([place])    Gout    Hyperlipidemia    Hypertension    Obesity     Past Surgical History:  Procedure Laterality Date   BIOPSY  02/15/2021   Procedure: BIOPSY;  Surgeon: Ronnette Juniper, MD;  Location: [facility];  Service: Gastroenterology;;   COLONOSCOPY WITH PROPOFOL N/A 02/15/2021   Procedure: COLONOSCOPY WITH PROPOFOL;  Surgeon: Ronnette Juniper, MD;  [facility];  Service: Gastroenterology;  Laterality: N/A;   ESOPHAGOGASTRODUODENOSCOPY (EGD) WITH PROPOFOL N/A 02/15/2021   Procedure: ESOPHAGOGASTRODUODENOSCOPY (EGD) WITH PROPOFOL;  Surgeon: Ronnette Juniper, MD;  [facility];  Service: Gastroenterology;  Laterality: N/A;   GANGLION CYST EXCISION Right 02/26/2020   Procedure: EXCISION TOPHUS RIGHT FOOT;  Surgeon: Evelina Bucy, DPM;  Location: [facility];  Service: Podiatry;  Laterality: Right;   NO PAST SURGERIES      There were no vitals filed for this visit.   Subjective Assessment - 06/18/21 1537      Subjective Pt reports that swelling in legs is better than it has been. PT did note pitting edema foot to mid calf bilateral. Some meds were changed. Hydralazine was stopped and another one was added. Was also started on trazodone to help sleep. Pt reports that he is walking without cane in home most of time.    Pertinent History Hx of AIDS, DMII, gout, ICU myopathy    Limitations Sitting;Standing;Walking;House hold activities    How long can you stand comfortably? maybe one minute    How long can you walk comfortably? Isn't walking    Patient Stated Goals "I wanna walk.  I want to be able to return to golf."    Currently in Pain? Yes    Pain Score --   11/10   Pain Location Ankle    Pain Orientation Right;Left    Pain Descriptors / Indicators Aching;Burning    Pain Type Chronic pain    Pain Onset More than a month ago    Pain Frequency Constant                               [facility] Adult PT Treatment/Exercise - 06/18/21 1543       Transfers   Transfers Sit to Stand;Stand to Sit    Sit to Stand 6: Modified independent (Device/Increase time);With upper extremity assist    Stand  to Sit 6: Modified independent (Device/Increase time)      Ambulation/Gait   Ambulation/Gait Yes    Ambulation/Gait Assistance 4: Min guard    Ambulation/Gait Assistance Details Pt carrying cane throughout walk. HR=82, O2 sat=95%. Reported pain in feet throughout walk and needed encouragement to continue.    Ambulation Distance (Feet) 115 Feet   x 2   Assistive device Straight cane    Gait Pattern Step-through pattern;Decreased step length - right;Decreased step length - left    Ambulation Surface Level;Indoor      Neuro Re-ed    Neuro Re-ed Details  In // bars: side stepping without UE support 8' x 4 with verbal cues to stand tall. Standing on airex without UE support x 30 sec eyes open, 30 sec eyes closed close SBA.                       PT Short Term Goals - 06/04/21 2250        PT SHORT TERM GOAL #1   Title Pt will be independent with initial HEP in order to indicate improved balance and functional mobility. (Target: 06/01/21)    Baseline 06/04/21: met with current HEP    Status Achieved    Target Date --      PT SHORT TERM GOAL #2   Title Pt will improve BERG balance score by 4 points from baseline in order to indicate decreased fall risk.    Baseline una9/30/22: 46/56 scored today (was 23/56)    Time 6    Period --    Status Achieved      PT SHORT TERM GOAL #3   Title Pt will perform TUG and goal to be updated to reflect progress and dec fall risk.    Baseline 06/04/21: 23.84 sec's with RW- PT to update LTGs    Time --    Period --    Status Achieved      PT SHORT TERM GOAL #4   Title Pt will ambulate x 100' nonstop with RW at S level in order to indicate improved independence with household mobility.    Baseline 150' at S level with RW on  05/25/2021    Time --    Period --    Status Achieved               PT Long Term Goals - 05/11/21 1347       PT LONG TERM GOAL #1   Title Pt will be independent with final HEP in order to indicate improved balance and functional mobility (Target:    Baseline dependent    Time 6    Period Weeks    Status New    Target Date 06/25/21      PT LONG TERM GOAL #2   Title Pt will improve BERG balance score by 8 points from baseline in order to indicate dec fall risk.    Baseline unable to finish due to time constraint    Time 6    Period Weeks    Status New      PT LONG TERM GOAL #3   Title Pt will ambulate 250' nonstop with RW at S level over indoor and paved outdoor surfaces in order to indicate improved limited community mobility.    Baseline 85' indoors with RW at min A    Time 6    Period Weeks    Status New      PT LONG TERM GOAL #  4   Title LTG to be written for TUG once assessed in order to relfect dec fall risk.    Baseline unable to assess on eval    Time 6    Period Weeks    Status  New      PT LONG TERM GOAL #5   Title Pt will go up/down 2 small steps with LRAD at S level in order to indicate safe home entry/exit.    Baseline did not have time to assess on eval    Time [place] - 06/18/21 2031     Clinical Impression Statement Pt reporting walking more in home without device. PT encouraged to use cane. Still reporting high pain levels in ankles/feet. Needs encouragement to participate and frequent rest breaks.    Personal Factors and Comorbidities Comorbidity 3+;Time since onset of injury/illness/exacerbation    Comorbidities see above    Examination-Activity Limitations Carry;Lift;Locomotion Level;Transfers;Stairs;Stand    Examination-Participation Restrictions Community Activity;Driving;Laundry    Stability/Clinical Decision Making Evolving/Moderate complexity    Rehab Potential Good    PT Frequency 2x / week    PT Duration 6 weeks    PT Treatment/Interventions ADLs/Self Care Home Management;Aquatic Therapy;DME Instruction;Gait training;Stair training;Functional mobility training;Therapeutic activities;Therapeutic exercise;Balance training;Neuromuscular re-education;Patient/family education;Passive range of motion;Dry needling;Energy conservation;Vestibular    PT Next Visit Plan Cert up end of next week. Are you planning to recert? Will need to add more visits if so. continue to work on LE strengthening,standing tolerance, gait with cane and stair training    PT Home Exercise Plan Access Code: [email or web address]    Consulted and Agree with Plan of Care Patient             Patient will benefit from skilled therapeutic intervention in order to improve the following deficits and impairments:  Abnormal gait, Cardiopulmonary status limiting activity, Decreased activity tolerance, Decreased balance, Decreased endurance, Decreased knowledge of use of DME, Decreased mobility, Decreased strength, Difficulty walking,  Increased edema, Impaired perceived functional ability, Impaired flexibility, Impaired sensation, Postural dysfunction, Pain, Obesity  Visit Diagnosis: Other abnormalities of gait and mobility  Muscle weakness (generalized)  Unsteadiness on feet     Problem List Patient Active Problem List   Diagnosis Date Noted   Anxiety 05/07/2021   Pain in joints of both feet    AKI (acute kidney injury) ([place])    SIRS (systemic inflammatory response syndrome) (HCC)    Leukocytosis    Bilateral swelling of feet    Debility 03/30/2021   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   Gout attack 03/30/2021   Acute left ankle pain    Acute pain of left shoulder    Altered mental status    Status post tracheostomy ([place])    Severe sepsis ([place])    Acute metabolic encephalopathy    Pressure injury of skin 03/09/2021   Septic shock ([place]) 03/08/2021   Acute respiratory failure with hypoxemia ([place]) 03/05/2021   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 ([place]) 03/05/2021   Elevated LFTs 03/05/2021   Multifocal pneumonia 03/04/2021   Candida esophagitis ([place]) 02/19/2021   Neuropathic pain 02/19/2021   Polypharmacy 02/19/2021   Cough    N&V (nausea and vomiting)    AIDS (acquired immune deficiency syndrome) (HCC)    Esophageal dysphagia  Gastroesophageal reflux disease with esophagitis without hemorrhage    Gastrointestinal hemorrhage    Abdominal pain    Creatinine elevation    Hypoglycemia 02/12/2021   HTN (hypertension) 02/12/2021   Anemia 02/12/2021   Acute renal failure ([place]) 02/11/2021    Electa Sniff, PT, DPT, NCS 06/18/2021, 8:33 PM  [facility] [address] [place], [place], [address] Phone: [phone]   Fax:  [phone]  Name: ANDRANIK JEUNE MRN: [id number] Date of Birth: [birth year]

## 2021-06-22 ENCOUNTER — Encounter: Payer: Self-pay | Admitting: Rehabilitation

## 2021-06-22 ENCOUNTER — Other Ambulatory Visit: Payer: Self-pay

## 2021-06-22 ENCOUNTER — Ambulatory Visit: Payer: Medicaid Other | Admitting: Rehabilitation

## 2021-06-22 DIAGNOSIS — M79672 Pain in left foot: Secondary | ICD-10-CM

## 2021-06-22 DIAGNOSIS — R2689 Other abnormalities of gait and mobility: Secondary | ICD-10-CM

## 2021-06-22 DIAGNOSIS — R2681 Unsteadiness on feet: Secondary | ICD-10-CM

## 2021-06-22 DIAGNOSIS — M6281 Muscle weakness (generalized): Secondary | ICD-10-CM

## 2021-06-22 DIAGNOSIS — M79671 Pain in right foot: Secondary | ICD-10-CM

## 2021-06-22 NOTE — Therapy (Signed)
[facility] [address] [place], [place], [address] Phone: [phone]   Fax:  [phone]  Physical Therapy Treatment  Patient Details  Name: Bruce Hunter MRN: [id number] Date of Birth: 10/21/1975 Referring Provider (PT): Courtney Heys, MD   Encounter Date: 06/22/2021   PT End of Session - 06/22/21 1611     Visit Number 10    Number of Visits 13    Authorization Type [employer]    Authorization Time Period 12 visits from 05/19/21-07/18/21    Authorization - Visit Number 9    Authorization - Number of Visits 12    PT Start Time 1320    PT Stop Time 1400    PT Time Calculation (min) 40 min    Activity Tolerance Patient limited by pain;Patient tolerated treatment well;No increased pain    Behavior During Therapy [facility] for tasks assessed/performed             Past Medical History:  Diagnosis Date   Anxiety 05/07/2021   Asthma    Diabetes mellitus without complication ([place])    Gout    Hyperlipidemia    Hypertension    Obesity     Past Surgical History:  Procedure Laterality Date   BIOPSY  02/15/2021   Procedure: BIOPSY;  Surgeon: Ronnette Juniper, MD;  [facility];  Service: Gastroenterology;;   COLONOSCOPY WITH PROPOFOL N/A 02/15/2021   Procedure: COLONOSCOPY WITH PROPOFOL;  Surgeon: Ronnette Juniper, MD;  [facility];  Service: Gastroenterology;  Laterality: N/A;   ESOPHAGOGASTRODUODENOSCOPY (EGD) WITH PROPOFOL N/A 02/15/2021   Procedure: ESOPHAGOGASTRODUODENOSCOPY (EGD) WITH PROPOFOL;  Surgeon: Ronnette Juniper, MD;  [facility];  Service: Gastroenterology;  Laterality: N/A;   GANGLION CYST EXCISION Right 02/26/2020   Procedure: EXCISION TOPHUS RIGHT FOOT;  Surgeon: Evelina Bucy, DPM;  Location: [facility];  Service: Podiatry;  Laterality: Right;   NO PAST SURGERIES      There were no vitals filed for this visit.   Subjective Assessment - 06/22/21 1609      Subjective Pt reports increased pain today and that he is unable to walk very much.    Pertinent History Hx of AIDS, DMII, gout, ICU myopathy    Limitations Sitting;Standing;Walking;House hold activities    How long can you stand comfortably? maybe one minute    How long can you walk comfortably? Isn't walking    Patient Stated Goals "I wanna walk.  I want to be able to return to golf."    Currently in Pain? Yes    Pain Score 10-Worst pain ever    Pain Location Ankle    Pain Orientation Right;Left    Pain Descriptors / Indicators Aching;Burning    Pain Type Chronic pain    Pain Onset More than a month ago    Pain Frequency Constant    Aggravating Factors  walking    Pain Relieving Factors rest, nothing                               [facility] Adult PT Treatment/Exercise - 06/22/21 1337       Ambulation/Gait   Ambulation/Gait Yes    Ambulation/Gait Assistance 5: Supervision    Ambulation/Gait Assistance Details Pt willing to ambulate pushing w/c today during session with max encouragement.  Pt much slower today and much more antalgic gait pattern than normal.  He reports that L ankle hurts more than normal  today.    Ambulation Distance (Feet) 115 Feet    Assistive device --   pushing w/c   Gait Pattern Step-through pattern;Decreased step length - right;Decreased step length - left    Ambulation Surface Level;Indoor      Self-Care   Self-Care Other Self-Care Comments    Other Self-Care Comments  Discussed pain issues that are main limitation.  He reports pain is worse today than previous sessions.  Disussed POC being up next week and that PT is hesitant to renew due to continued high levels of pain despite mobility being ok.  PT did add 2 visits next week to work on wrapping up at this time.  Pt verbalized understanding.      Exercises   Other Exercises  Supine BLE bridging with 5 sec hold x 10 reps, SLR x 10 reps with emphasis on slow controlled descent to mat. SL  hip abd (with leg straight) x 10 reps on each side with heavy cues for maintaining hip position.      Lumbar Exercises: Aerobic   Other Aerobic Exercise Scifit UE/LE's level 2.5 x 7 minutes, 3 resistance x 3 mins with LE only with goal >/= 60 steps per minute for strengthening and activity tolerance.                       PT Short Term Goals - 06/04/21 2250       PT SHORT TERM GOAL #1   Title Pt will be independent with initial HEP in order to indicate improved balance and functional mobility. (Target: 06/01/21)    Baseline 06/04/21: met with current HEP    Status Achieved    Target Date --      PT SHORT TERM GOAL #2   Title Pt will improve BERG balance score by 4 points from baseline in order to indicate decreased fall risk.    Baseline una9/30/22: 46/56 scored today (was 23/56)    Time 6    Period --    Status Achieved      PT SHORT TERM GOAL #3   Title Pt will perform TUG and goal to be updated to reflect progress and dec fall risk.    Baseline 06/04/21: 23.84 sec's with RW- PT to update LTGs    Time --    Period --    Status Achieved      PT SHORT TERM GOAL #4   Title Pt will ambulate x 100' nonstop with RW at S level in order to indicate improved independence with household mobility.    Baseline 150' at S level with RW on  05/25/2021    Time --    Period --    Status Achieved               PT Long Term Goals - 05/11/21 1347       PT LONG TERM GOAL #1   Title Pt will be independent with final HEP in order to indicate improved balance and functional mobility (Target:    Baseline dependent    Time 6    Period Weeks    Status New    Target Date 06/25/21      PT LONG TERM GOAL #2   Title Pt will improve BERG balance score by 8 points from baseline in order to indicate dec fall risk.    Baseline unable to finish due to time constraint    Time 6    Period Weeks  Status New      PT LONG TERM GOAL #3   Title Pt will ambulate 250' nonstop with RW  at S level over indoor and paved outdoor surfaces in order to indicate improved limited community mobility.    Baseline 85' indoors with RW at min A    Time 6    Period Weeks    Status New      PT LONG TERM GOAL #4   Title LTG to be written for TUG once assessed in order to relfect dec fall risk.    Baseline unable to assess on eval    Time 6    Period Weeks    Status New      PT LONG TERM GOAL #5   Title Pt will go up/down 2 small steps with LRAD at S level in order to indicate safe home entry/exit.    Baseline did not have time to assess on eval    Time 6    Period Weeks    Status New                   Plan - 06/22/21 1611     Clinical Impression Statement Skilled session focused on seated and supine/SL therex as pt reporting increased pain today, feeling as though is unable to do much standing/walking.  He did agree to walk some pushing w/c.  Discussed POC and feel that due to pain limitations, we will add 2 more visits next week to wrap up and D/C    Personal Factors and Comorbidities Comorbidity 3+;Time since onset of injury/illness/exacerbation    Comorbidities see above    Examination-Activity Limitations Carry;Lift;Locomotion Level;Transfers;Stairs;Stand    Examination-Participation Restrictions Community Activity;Driving;Laundry    Stability/Clinical Decision Making Evolving/Moderate complexity    Rehab Potential Good    PT Frequency 2x / week    PT Duration 6 weeks    PT Treatment/Interventions ADLs/Self Care Home Management;Aquatic Therapy;DME Instruction;Gait training;Stair training;Functional mobility training;Therapeutic activities;Therapeutic exercise;Balance training;Neuromuscular re-education;Patient/family education;Passive range of motion;Dry needling;Energy conservation;Vestibular    PT Next Visit Plan plan to D/C next week.  Will need to add more visits if so. continue to work on LE strengthening,standing tolerance, gait with cane and stair training     PT Home Exercise Plan Access Code: [email or web address]    Consulted and Agree with Plan of Care Patient             Patient will benefit from skilled therapeutic intervention in order to improve the following deficits and impairments:  Abnormal gait, Cardiopulmonary status limiting activity, Decreased activity tolerance, Decreased balance, Decreased endurance, Decreased knowledge of use of DME, Decreased mobility, Decreased strength, Difficulty walking, Increased edema, Impaired perceived functional ability, Impaired flexibility, Impaired sensation, Postural dysfunction, Pain, Obesity  Visit Diagnosis: Other abnormalities of gait and mobility  Unsteadiness on feet  Muscle weakness (generalized)  Pain in both feet     Problem List Patient Active Problem List   Diagnosis Date Noted   Anxiety 05/07/2021   Pain in joints of both feet    AKI (acute kidney injury) ([place])    SIRS (systemic inflammatory response syndrome) (HCC)    Leukocytosis    Bilateral swelling of feet    Debility 03/30/2021   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   Gout attack 03/30/2021   Acute left ankle pain    Acute pain of left shoulder    Altered mental status    Status post tracheostomy ([place])    Severe sepsis ([place])  Acute metabolic encephalopathy    Pressure injury of skin 03/09/2021   Septic shock ([place]) 03/08/2021   Acute respiratory failure with hypoxemia ([place]) 03/05/2021   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 ([place]) 03/05/2021   Elevated LFTs 03/05/2021   Multifocal pneumonia 03/04/2021   Candida esophagitis ([place]) 02/19/2021   Neuropathic pain 02/19/2021   Polypharmacy 02/19/2021   Cough    N&V (nausea and vomiting)    AIDS (acquired immune deficiency syndrome) (HCC)    Esophageal dysphagia    Gastroesophageal reflux disease with esophagitis without hemorrhage    Gastrointestinal hemorrhage    Abdominal pain     Creatinine elevation    Hypoglycemia 02/12/2021   HTN (hypertension) 02/12/2021   Anemia 02/12/2021   Acute renal failure ([place]) 02/11/2021    Cameron Sprang, PT, MPT [facility] [address] [place], [place], [address] Phone: [phone]   Fax:  [phone] 06/22/21, 4:14 PM   Name: Bruce Hunter MRN: [id number] Date of Birth: 01/04/1976

## 2021-06-24 ENCOUNTER — Other Ambulatory Visit: Payer: Self-pay | Admitting: Physical Medicine and Rehabilitation

## 2021-06-24 ENCOUNTER — Ambulatory Visit (INDEPENDENT_AMBULATORY_CARE_PROVIDER_SITE_OTHER): Payer: Medicaid Other | Admitting: Infectious Disease

## 2021-06-24 ENCOUNTER — Other Ambulatory Visit: Payer: Self-pay

## 2021-06-24 ENCOUNTER — Encounter: Payer: Self-pay | Admitting: Infectious Disease

## 2021-06-24 VITALS — BP 177/115 | HR 95 | Temp 98.6°F | Ht 70.0 in | Wt 291.0 lb

## 2021-06-24 DIAGNOSIS — E1142 Type 2 diabetes mellitus with diabetic polyneuropathy: Secondary | ICD-10-CM

## 2021-06-24 DIAGNOSIS — B59 Pneumocystosis: Secondary | ICD-10-CM

## 2021-06-24 DIAGNOSIS — Z6841 Body Mass Index (BMI) 40.0 and over, adult: Secondary | ICD-10-CM

## 2021-06-24 DIAGNOSIS — E669 Obesity, unspecified: Secondary | ICD-10-CM | POA: Insufficient documentation

## 2021-06-24 DIAGNOSIS — I160 Hypertensive urgency: Secondary | ICD-10-CM | POA: Diagnosis not present

## 2021-06-24 DIAGNOSIS — J811 Chronic pulmonary edema: Secondary | ICD-10-CM

## 2021-06-24 DIAGNOSIS — E66813 Obesity, class 3: Secondary | ICD-10-CM

## 2021-06-24 DIAGNOSIS — J810 Acute pulmonary edema: Secondary | ICD-10-CM | POA: Diagnosis not present

## 2021-06-24 DIAGNOSIS — B20 Human immunodeficiency virus [HIV] disease: Secondary | ICD-10-CM

## 2021-06-24 HISTORY — DX: Hypertensive urgency: I16.0

## 2021-06-24 HISTORY — DX: Chronic pulmonary edema: J81.1

## 2021-06-24 HISTORY — DX: Obesity, unspecified: E66.9

## 2021-06-24 LAB — COMPREHENSIVE METABOLIC PANEL
ALT: 87 U/L — ABNORMAL HIGH (ref 0–44)
AST: 137 U/L — ABNORMAL HIGH (ref 15–41)
Albumin: 3.5 g/dL (ref 3.5–5.0)
Alkaline Phosphatase: 79 U/L (ref 38–126)
Anion gap: 9 (ref 5–15)
BUN: 32 mg/dL — ABNORMAL HIGH (ref 6–20)
CO2: 27 mmol/L (ref 22–32)
Calcium: 9.1 mg/dL (ref 8.9–10.3)
Chloride: 104 mmol/L (ref 98–111)
Creatinine, Ser: 1.87 mg/dL — ABNORMAL HIGH (ref 0.61–1.24)
GFR, Estimated: 45 mL/min — ABNORMAL LOW (ref >60)
Glucose, Bld: 299 mg/dL — ABNORMAL HIGH (ref 70–99)
Potassium: 5.3 mmol/L — ABNORMAL HIGH (ref 3.5–5.1)
Sodium: 140 mmol/L (ref 135–145)
Total Bilirubin: 0.3 mg/dL (ref 0.3–1.2)
Total Protein: 6.5 g/dL (ref 6.5–8.1)

## 2021-06-24 NOTE — Progress Notes (Signed)
Chief complaint: He is very upset about the amount of weight he has gained he is also short of breath and edematous  patient ID: Bruce Hunter, male    DOB: Jul 18, 1976, 45 y.o.   MRN: [id number]  HPI  Bruce Hunter is a 45 year old black man who was recently diagnosed with HIV and AIDS.  He recently had a retracted hospital stay from March 30, 2021 till the very end of August 2022.  Admitted with respiratory failure that required mechanical intubation and ventilation and ultimately underwent tracheostomy.  He completed a treatment course for PCP pneumonia though PCP was never proven he did have evidence of deteriorating few days during his stay at [facility] when he was off anti-PCP medications.  Developed septic physiology with shock and acute renal failure requiring CVVHD briefly.  Urgently was able to come off the ventilator and his renal function recovered completely.  His hospital course was further complicated by diffuse gout that required a corticosteroid taper.  He also was diagnosed with diabetes mellitus while in the hospital and now is on insulin.  He ultimately is discharged in the inpatient unit to inpatient rehab where he was further taken care of.  My partners Dr. Gale Journey and Baxter Flattery saw him again when he had recurrent fevers.  F AFB cultures were sent as well as testing for gonorrhea chlamydia CT chest abdomen pelvis.    He was started back on corticosteroids with resolution of his fevers.  He now finish steroids for gout and is having intermittent fevers again at times.  I suspect the fevers were  due to IRIS.  Kirtis was quite volume overloaded when I last saw him and edematous with a proBNP over thousand.  At the time I thought we whitest admitted to the hospital for aggressive diuresis but he refused.  Today he came in the clinic and was profoundly hypertensive with a blood pressure in the 200s over 120s initially.  He was also quite tachypneic and short of breath though he was  not hypoxic on pulse oximeter.  I again recommended admission to the hospital for diuresis but he was not agreeable to this at this point in time..  Later after he was having his labs drawn he told me that as long as he can find someone to watch his dog and make sure that his place is safe he was agreeable to going to the emergency room for admission but he would likely not do so until next week on Monday.  He is also going to see his primary care doctor Dr. Jonelle Sidle today.  I spoke with his nurse over the phone as well.    Past Medical History:  Diagnosis Date   Anxiety 05/07/2021   Asthma    Diabetes mellitus without complication ([place])    Gout    Hyperlipidemia    Hypertension    Hypertensive urgency 06/24/2021   Obesity    Obesity 06/24/2021   Pulmonary edema 06/24/2021    Past Surgical History:  Procedure Laterality Date   BIOPSY  02/15/2021   Procedure: BIOPSY;  Surgeon: Ronnette Juniper, MD;  [facility];  Service: Gastroenterology;;   COLONOSCOPY WITH PROPOFOL N/A 02/15/2021   Procedure: COLONOSCOPY WITH PROPOFOL;  Surgeon: Ronnette Juniper, MD;  [facility];  Service: Gastroenterology;  Laterality: N/A;   ESOPHAGOGASTRODUODENOSCOPY (EGD) WITH PROPOFOL N/A 02/15/2021   Procedure: ESOPHAGOGASTRODUODENOSCOPY (EGD) WITH PROPOFOL;  Surgeon: Ronnette Juniper, MD;  [facility];  Service: Gastroenterology;  Laterality: N/A;  GANGLION CYST EXCISION Right 02/26/2020   Procedure: EXCISION TOPHUS RIGHT FOOT;  Surgeon: Evelina Bucy, DPM;  Location: [facility];  Service: Podiatry;  Laterality: Right;   NO PAST SURGERIES      Family History  Problem Relation Age of Onset   Asthma Mother    Cancer Mother    Diabetes Mother    Obesity Mother    Obesity Sister    Diabetes Brother    Obesity Brother    Cancer Maternal Grandmother    Diabetes Maternal Grandmother    Diabetes Other       Social History   Socioeconomic History   Marital status:  Single    Spouse name: Not on file   Number of children: Not on file   Years of education: Not on file   Highest education level: Not on file  Occupational History   Not on file  Tobacco Use   Smoking status: Every Day    Packs/day: 1.00    Types: Cigarettes   Smokeless tobacco: Never  Substance and Sexual Activity   Alcohol use: Yes   Drug use: Yes    Frequency: 1.0 times per week    Types: Marijuana    Comment: marijuana at midnight last night; daily   Sexual activity: Yes  Other Topics Concern   Not on file  Social History Narrative   Not on file   Social Determinants of Health   Financial Resource Strain: Not on file  Food Insecurity: Not on file  Transportation Needs: Not on file  Physical Activity: Not on file  Stress: Not on file  Social Connections: Not on file    Allergies  Allergen Reactions   Lactose Intolerance (Gi)      Current Outpatient Medications:    acetaminophen (TYLENOL) 325 MG tablet, Take 1 tablet (325 mg total) by mouth every 4 (four) hours as needed for mild pain., Disp: 100 tablet, Rfl: 0   clonazePAM (KLONOPIN) 0.5 MG tablet, Take 1 tablet (0.5 mg total) by mouth 3 (three) times daily., Disp: 90 tablet, Rfl: 0   cyanocobalamin 1000 MCG tablet, Take 1 tablet (1,000 mcg total) by mouth daily., Disp: 100 tablet, Rfl: 2   dapsone 100 MG tablet, Take 1 tablet (100 mg total) by mouth daily. Restart on 04/03/2021, Disp: 30 tablet, Rfl: 11   diclofenac Sodium (VOLTAREN) 1 % GEL, Apply 2 g topically 4 (four) times daily., Disp: 300 g, Rfl: 0   Dolutegravir-lamiVUDine (DOVATO) 50-300 MG TABS, Take 1 tablet by mouth daily., Disp: 30 tablet, Rfl: 11   famotidine (PEPCID) 40 MG tablet, Take 1 tablet (40 mg total) by mouth daily., Disp: 30 tablet, Rfl: 3   furosemide (LASIX) 40 MG tablet, Take 1 tablet (40 mg total) by mouth every Monday, Wednesday, and Friday., Disp: 15 tablet, Rfl: 0   gabapentin (NEURONTIN) 300 MG capsule, Take 2 capsules (600 mg total)  by mouth 3 (three) times daily., Disp: 180 capsule, Rfl: 0   insulin glargine (LANTUS SOLOSTAR) 100 UNIT/ML Solostar Pen, Inject 5 Units into the skin daily., Disp: 15 mL, Rfl: 0   Insulin Pen Needle 32G X 4 MM MISC, use as directed, Disp: 100 each, Rfl: 0   magnesium oxide (MAG-OX) 400 MG tablet, Take 1 tablet (400 mg total) by mouth 2 (two) times daily., Disp: 60 tablet, Rfl: 0   methocarbamol (ROBAXIN) 500 MG tablet, Take 1 tablet (500 mg total) by mouth every 6 (six) hours as needed for muscle  spasms., Disp: 120 tablet, Rfl: 0   metoprolol tartrate (LOPRESSOR) 50 MG tablet, Take 1 tablet (50 mg total) by mouth 2 (two) times daily., Disp: 60 tablet, Rfl: 0   predniSONE (DELTASONE) 10 MG tablet, Take 3 tablets (30 mg total) by mouth daily with breakfast., Disp: 90 tablet, Rfl: 0   tamsulosin (FLOMAX) 0.4 MG CAPS capsule, Take 1 capsule (0.4 mg total) by mouth daily after supper., Disp: 30 capsule, Rfl: 0   traZODone (DESYREL) 50 MG tablet, Take 50 mg by mouth at bedtime., Disp: , Rfl:    hydrALAZINE (APRESOLINE) 25 MG tablet, Take 1 tablet (25 mg total) by mouth every 8 (eight) hours. (Patient not taking: No sig reported), Disp: 90 tablet, Rfl: 0   lidocaine (LIDODERM) 5 %, Place 2 patches onto the skin daily. Remove & Discard patch within 12 hours or as directed by MD (Patient not taking: Reported on 06/24/2021), Disp: 30 patch, Rfl: 0   morphine (MS CONTIN) 15 MG 12 hr tablet, Take 1 tablet (15 mg total) by mouth every 12 (twelve) hours. (Patient not taking: No sig reported), Disp: 10 tablet, Rfl: 0   Oxycodone HCl 10 MG TABS, Take 1 tablet (10 mg total) by mouth every 4 (four) hours as needed. MAX 5 TABS PER DAY AS NEEDED FOR SEVERE PAIN (Patient not taking: Reported on 06/24/2021), Disp: 25 tablet, Rfl: 0   polyethylene glycol (MIRALAX / GLYCOLAX) 17 g packet, Take 17 g by mouth daily as needed for moderate constipation. (Patient not taking: Reported on 06/24/2021), Disp: , Rfl:    Review of  Systems  Constitutional:  Positive for fatigue and unexpected weight change. Negative for activity change, appetite change, chills, diaphoresis and fever.  HENT:  Negative for congestion, rhinorrhea, sinus pressure, sneezing, sore throat and trouble swallowing.   Eyes:  Negative for photophobia and visual disturbance.  Respiratory:  Positive for cough, shortness of breath and wheezing. Negative for chest tightness and stridor.   Cardiovascular:  Positive for leg swelling. Negative for chest pain and palpitations.  Gastrointestinal:  Negative for abdominal distention, abdominal pain, anal bleeding, blood in stool, constipation, diarrhea, nausea and vomiting.  Genitourinary:  Negative for difficulty urinating, dysuria, flank pain and hematuria.  Musculoskeletal:  Positive for arthralgias. Negative for back pain, gait problem, joint swelling and myalgias.  Skin:  Negative for color change, pallor, rash and wound.  Neurological:  Negative for dizziness, tremors, weakness and light-headedness.  Hematological:  Negative for adenopathy. Does not bruise/bleed easily.  Psychiatric/Behavioral:  Negative for agitation, behavioral problems, confusion, decreased concentration, dysphoric mood and sleep disturbance.       Objective:   Physical Exam Constitutional:      Appearance: He is well-developed. He is obese.  HENT:     Head: Normocephalic and atraumatic.  Eyes:     Conjunctiva/sclera: Conjunctivae normal.  Cardiovascular:     Rate and Rhythm: Normal rate and regular rhythm.     Heart sounds: No murmur heard.   No friction rub. No gallop.  Pulmonary:     Effort: Tachypnea present. No respiratory distress.     Breath sounds: Wheezing present.  Abdominal:     General: There is no distension.     Palpations: Abdomen is soft.  Musculoskeletal:        General: No tenderness. Normal range of motion.     Cervical back: Normal range of motion and neck supple.  Skin:    General: Skin is warm and  dry.  Coloration: Skin is not pale.     Findings: No erythema or rash.  Neurological:     General: No focal deficit present.     Mental Status: He is alert and oriented to person, place, and time.  Psychiatric:        Attention and Perception: Attention normal.        Mood and Affect: Mood is anxious.        Speech: Speech normal.        Behavior: Behavior normal.        Thought Content: Thought content normal.        Cognition and Memory: Cognition normal.        Judgment: Judgment normal.          Assessment & Plan:    Tachypnea and wheezing with lower extremity edema:  He is clearly volume overloaded.  I do not know if he might now have developed some congestive heart failure or if he has worsening volume overload in the context of progressive renal failure.  His proBNP was over thousand when I checked it at the beginning of this month.  I am rechecking it again today with a statin comprehensive metabolic panel.  I recommend he be admitted for aggressive diuresis.  Hypertensive urgency: His blood pressure did come down to the [id number] systolic over 0000000 after resting in the clinic for some period of time.  See above discussion with regards to management of his volume status.  I am concerned that he is going to ultimately have a stroke or permanent damage to his kidneys from his uncontrolled hypertension.  Smoking: He says he started smoking again which she is the reason why he thinks he is short of breath.    Weight gain: Some of this is potentially related to weight gain in the context of integrase strand transfer inhibitors in terms of gaining muscle but also fat.  I think though quite a bit of his weight gain relates to volume overload and third spacing of fluid in his legs and into his lungs.  Again admission for aggressive diuresis was recommended.  Acute on chronic renal failure: He is being followed by nephrology who have adjusted some of his  medications.  Diabetes mellitus followed by Dr. Jonelle Sidle.  HIV/AIDS:  I am rechecking an HIV viral load and a CD4 count today we will continue prescription for Dovato with Bactrim for now.  I spent 41 minutes with the patient including greater than 50% face to face counseling of the patient regarding his need for admission to the hospital for aggressive diuresis, review reviewing his prior protracted hospital stay and explaining how I did not think that admission to manage his volume overload would be more than a few days, emphasizing need to be highly adherent to his antiretroviral medication reviewing past viral load CD4 count CMP CBC with differential along with data review of medical records before and during the visit and in coordination of his care with his home health nurse and primary care.

## 2021-06-25 ENCOUNTER — Telehealth: Payer: Self-pay

## 2021-06-25 ENCOUNTER — Other Ambulatory Visit (HOSPITAL_COMMUNITY): Payer: Self-pay

## 2021-06-25 ENCOUNTER — Ambulatory Visit: Payer: Medicaid Other

## 2021-06-25 DIAGNOSIS — R52 Pain, unspecified: Secondary | ICD-10-CM

## 2021-06-25 DIAGNOSIS — M6281 Muscle weakness (generalized): Secondary | ICD-10-CM

## 2021-06-25 DIAGNOSIS — R2681 Unsteadiness on feet: Secondary | ICD-10-CM

## 2021-06-25 DIAGNOSIS — R2689 Other abnormalities of gait and mobility: Secondary | ICD-10-CM

## 2021-06-25 LAB — T-HELPER CELL (CD4) - (RCID CLINIC ONLY)
CD4 % Helper T Cell: 19 % — ABNORMAL LOW (ref 33–65)
CD4 T Cell Abs: 145 /uL — ABNORMAL LOW (ref 400–1790)

## 2021-06-25 MED ORDER — ACETAMINOPHEN 325 MG PO TABS
325.0000 mg | ORAL_TABLET | ORAL | 2 refills | Status: DC | PRN
  Filled 2021-06-25 – 2021-06-27 (×2): qty 100, 17d supply, fill #0

## 2021-06-25 MED ORDER — CLONAZEPAM 0.5 MG PO TABS: 0.5000 mg | ORAL_TABLET | Freq: Three times a day (TID) | ORAL | 2 refills | Status: DC

## 2021-06-25 NOTE — Telephone Encounter (Signed)
RN called to follow up with patient to see how he was feeling and see if he was still planning on going to the ED. Patient stated he was going to get things taken care of at home this weekend and then he would be going on Monday. Patient asked for [place] to set up an appointment for him in the ED; RN explained that we can't do that and he just needs to show up for evaluation. Explained that their staff can review his chart and will see he's been advised to go. Patient verbalized understanding and reiterated he would be going on Monday.   Vaneta Hammontree Lorita Officer, RN

## 2021-06-25 NOTE — Therapy (Addendum)
[facility] [address] [place], [place], [address] Phone: [phone]   Fax:  [phone]  Physical Therapy Treatment/DC Summary  Patient Details  Name: Bruce Hunter MRN: [id number] Date of Birth: 1976/06/26 Referring Provider (PT): Courtney Heys, MD   Encounter Date: 06/25/2021 PHYSICAL THERAPY DISCHARGE SUMMARY  Visits from Start of Care: 11  Current functional level related to goals / functional outcomes: UTA   Remaining deficits: mobility   Education / Equipment: HEP   Patient agrees to discharge. Patient goals were partially met. Patient is being discharged due to a change in medical status.   PT End of Session - 06/25/21 1333     Visit Number 11    Number of Visits 13    Date for PT Re-Evaluation 07/18/21    Authorization Type [employer]    Authorization Time Period 12 visits from 05/19/21-07/18/21    Authorization - Visit Number 9    Authorization - Number of Visits 12    PT Start Time 1320    PT Stop Time 1400    PT Time Calculation (min) 40 min    Equipment Utilized During Treatment Gait belt    Activity Tolerance Patient limited by pain;Patient tolerated treatment well;No increased pain    Behavior During Therapy [facility] for tasks assessed/performed             Past Medical History:  Diagnosis Date   Anxiety 05/07/2021   Asthma    Diabetes mellitus without complication ([place])    Gout    Hyperlipidemia    Hypertension    Hypertensive urgency 06/24/2021   Obesity    Obesity 06/24/2021   Pulmonary edema 06/24/2021    Past Surgical History:  Procedure Laterality Date   BIOPSY  02/15/2021   Procedure: BIOPSY;  Surgeon: Ronnette Juniper, MD;  [facility];  Service: Gastroenterology;;   COLONOSCOPY WITH PROPOFOL N/A 02/15/2021   Procedure: COLONOSCOPY WITH PROPOFOL;  Surgeon: Ronnette Juniper, MD;  [facility];  Service: Gastroenterology;  Laterality: N/A;    ESOPHAGOGASTRODUODENOSCOPY (EGD) WITH PROPOFOL N/A 02/15/2021   Procedure: ESOPHAGOGASTRODUODENOSCOPY (EGD) WITH PROPOFOL;  Surgeon: Ronnette Juniper, MD;  [facility];  Service: Gastroenterology;  Laterality: N/A;   GANGLION CYST EXCISION Right 02/26/2020   Procedure: EXCISION TOPHUS RIGHT FOOT;  Surgeon: Evelina Bucy, DPM;  Location: [facility];  Service: Podiatry;  Laterality: Right;   NO PAST SURGERIES      There were no vitals filed for this visit.   Subjective Assessment - 06/25/21 1320     Subjective Not wearing compression socks today.  Reporting continued B foot/ankle pain    Pertinent History Hx of AIDS, DMII, gout, ICU myopathy    Limitations Sitting;Standing;Walking;House hold activities    How long can you stand comfortably? maybe one minute    How long can you walk comfortably? Isn't walking    Patient Stated Goals "I wanna walk.  I want to be able to return to golf."    Pain Onset More than a month ago                [facility] PT Assessment - 06/25/21 0001       Timed Up and Go Test   TUG Normal TUG    Normal TUG (seconds) 10.7    TUG Comments 18.8s initially, 10/7s on second attempt, cane used for both trials  [facility] Adult PT Treatment/Exercise - 06/25/21 0001       Transfers   Transfers Sit to Stand;Stand to Sit    Sit to Stand 6: Modified independent (Device/Increase time);5: Supervision    Stand to Sit 6: Modified independent (Device/Increase time);5: Supervision    Number of Reps 10 reps    Comments minimal use of arms      Ambulation/Gait   Ambulation/Gait Yes    Ambulation/Gait Assistance 4: Min guard    Ambulation Distance (Feet) 115 Feet    Assistive device Rolling walker    Gait Pattern Step-through pattern;Decreased step length - right;Decreased step length - left    Ambulation Surface Level;Indoor    Stairs Yes    Stairs Assistance 5: Supervision;4: Min guard    Stairs  Assistance Details (indicate cue type and reason) VCs for foot placement    Stair Management Technique One rail Left;Step to pattern    Number of Stairs 4    Height of Stairs 6    Gait Comments 1103f with cane, 1158fwith RW                       PT Short Term Goals - 06/04/21 2250       PT SHORT TERM GOAL #1   Title Pt will be independent with initial HEP in order to indicate improved balance and functional mobility. (Target: 06/01/21)    Baseline 06/04/21: met with current HEP    Status Achieved    Target Date --      PT SHORT TERM GOAL #2   Title Pt will improve BERG balance score by 4 points from baseline in order to indicate decreased fall risk.    Baseline una9/30/22: 46/56 scored today (was 23/56)    Time 6    Period --    Status Achieved      PT SHORT TERM GOAL #3   Title Pt will perform TUG and goal to be updated to reflect progress and dec fall risk.    Baseline 06/04/21: 23.84 sec's with RW- PT to update LTGs    Time --    Period --    Status Achieved      PT SHORT TERM GOAL #4   Title Pt will ambulate x 100' nonstop with RW at S level in order to indicate improved independence with household mobility.    Baseline 150' at S level with RW on  05/25/2021    Time --    Period --    Status Achieved               PT Long Term Goals - 06/25/21 1342       PT LONG TERM GOAL #1   Title Pt will be independent with final HEP in order to indicate improved balance and functional mobility (Target:    Baseline dependent    Time 6    Period Weeks    Status New      PT LONG TERM GOAL #2   Title Pt will improve BERG balance score by 8 points from baseline in order to indicate dec fall risk.    Baseline unable to finish due to time constraint    Time 6    Period Weeks    Status New      PT LONG TERM GOAL #3   Title Pt will ambulate 250' nonstop with RW at S level over indoor and paved outdoor surfaces in order to indicate improved  limited community  mobility.    Baseline 85' indoors with RW at min A;06/25/21 160f x2 with RW and once with cane    Time 6    Period Weeks    Status New      PT LONG TERM GOAL #4   Title LTG to be written for TUG once assessed in order to relfect dec fall risk.    Baseline unable to assess on eval; 06/25/21 TUG time 10.7s with cane, no goal needed    Time 6    Period Weeks    Status Achieved      PT LONG TERM GOAL #5   Title Pt will go up/down 2 small steps with LRAD at S level in order to indicate safe home entry/exit.    Baseline did not have time to assess on eval; 06/25/21 Patient able to negotiate 4 steps with cane and single rail step to pattern    Time 6    Period Weeks    Status Achieved                   Plan - 06/25/21 1333     Clinical Impression Statement Todays session focused on gait and transfer training.  Extended STS transfer reps to 10, ambulation of 2 bouts once with RW and once with cane covering 1164feach time, negotiated 4 steps with cane allowing for rest breaks to accomodate SOB and pain.  O2 sats remained stable throughout, >96%    Personal Factors and Comorbidities Comorbidity 3+;Time since onset of injury/illness/exacerbation    Comorbidities see above    Examination-Activity Limitations Carry;Lift;Locomotion Level;Transfers;Stairs;Stand    Examination-Participation Restrictions Community Activity;Driving;Laundry    Stability/Clinical Decision Making Evolving/Moderate complexity    Rehab Potential Good    PT Frequency 2x / week    PT Duration 6 weeks    PT Treatment/Interventions ADLs/Self Care Home Management;Aquatic Therapy;DME Instruction;Gait training;Stair training;Functional mobility training;Therapeutic activities;Therapeutic exercise;Balance training;Neuromuscular re-education;Patient/family education;Passive range of motion;Dry needling;Energy conservation;Vestibular    PT Next Visit Plan plan to D/C next week.  Will need to add more visits if so.  continue to work on LE strengthening,standing tolerance, gait with cane and stair training, anticipate DC week of 06/28/21 to 07/02/21, BERG, 25053fontinuous ambulation    PT Home Exercise Plan Access Code: [email or web address]    Consulted and Agree with Plan of Care Patient             Patient will benefit from skilled therapeutic intervention in order to improve the following deficits and impairments:  Abnormal gait, Cardiopulmonary status limiting activity, Decreased activity tolerance, Decreased balance, Decreased endurance, Decreased knowledge of use of DME, Decreased mobility, Decreased strength, Difficulty walking, Increased edema, Impaired perceived functional ability, Impaired flexibility, Impaired sensation, Postural dysfunction, Pain, Obesity  Visit Diagnosis: Other abnormalities of gait and mobility  Unsteadiness on feet  Muscle weakness (generalized)  Pain     Problem List Patient Active Problem List   Diagnosis Date Noted   Hypertensive urgency 06/24/2021   Pulmonary edema 06/24/2021   Obesity 06/24/2021   Anxiety 05/07/2021   Pain in joints of both feet    AK[place] (acute kidney injury) ([id number]  SIRS (systemic inflammatory response syndrome) ([facility])    Leukocytosis    Bilateral swelling of feet    Debility 03/30/2021   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   Gout attack 03/30/2021   Acute left ankle pain    Acute pain of left shoulder    Altered mental status  Status post tracheostomy ([place])    Severe sepsis ([place])    Acute metabolic encephalopathy    Pressure injury of skin 03/09/2021   Septic shock ([place]) 03/08/2021   Acute respiratory failure with hypoxemia ([place]) 03/05/2021   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 ([place]) 03/05/2021   Elevated LFTs 03/05/2021   Multifocal pneumonia 03/04/2021   Candida esophagitis ([place]) 02/19/2021   Neuropathic pain 02/19/2021   Polypharmacy  02/19/2021   Cough    N&V (nausea and vomiting)    AIDS (acquired immune deficiency syndrome) (HCC)    Esophageal dysphagia    Gastroesophageal reflux disease with esophagitis without hemorrhage    Gastrointestinal hemorrhage    Abdominal pain    Creatinine elevation    Hypoglycemia 02/12/2021   HTN (hypertension) 02/12/2021   Anemia 02/12/2021   Acute renal failure ([place]) 02/11/2021    Lanice Shirts, PT 06/25/2021, 3:20 PM  [facility] [address] [place], [place], [address] Phone: [phone]   Fax:  [phone]  Name: Bruce Hunter MRN: [id number] Date of Birth: [birth year]

## 2021-06-25 NOTE — Telephone Encounter (Signed)
-----   Message from Truman Hayward, MD sent at 06/24/2021  6:38 PM EDT ----- Bruce Hunter seemed agreeable to admission for treatment of his volume overload on Monday via ER. His Cr is now up to almost 1.9 ----- Message ----- From: Interface, Lab In [name] Sent: 06/24/2021   2:35 PM EDT To: Truman Hayward, MD

## 2021-06-28 ENCOUNTER — Ambulatory Visit: Payer: Medicaid Other | Admitting: Podiatry

## 2021-06-28 ENCOUNTER — Other Ambulatory Visit (HOSPITAL_COMMUNITY): Payer: Self-pay

## 2021-06-28 LAB — CBC WITH DIFFERENTIAL/PLATELET
Absolute Monocytes: 446 cells/uL (ref 200–950)
Basophils Absolute: 50 cells/uL (ref 0–200)
Basophils Relative: 0.5 %
Eosinophils Absolute: 10 cells/uL — ABNORMAL LOW (ref 15–500)
Eosinophils Relative: 0.1 %
HCT: 35 % — ABNORMAL LOW (ref 38.5–50.0)
Hemoglobin: 10.9 g/dL — ABNORMAL LOW (ref 13.2–17.1)
Lymphs Abs: 772 cells/uL — ABNORMAL LOW (ref 850–3900)
MCH: 29.6 pg (ref 27.0–33.0)
MCHC: 31.1 g/dL — ABNORMAL LOW (ref 32.0–36.0)
MCV: 95.1 fL (ref 80.0–100.0)
MPV: 11.1 fL (ref 7.5–12.5)
Monocytes Relative: 4.5 %
Neutro Abs: 8623 cells/uL — ABNORMAL HIGH (ref 1500–7800)
Neutrophils Relative %: 87.1 %
Platelets: 210 10*3/uL (ref 140–400)
RBC: 3.68 10*6/uL — ABNORMAL LOW (ref 4.20–5.80)
RDW: 15 % (ref 11.0–15.0)
Total Lymphocyte: 7.8 %
WBC: 9.9 10*3/uL (ref 3.8–10.8)

## 2021-06-28 LAB — NT-PROBNP: Pro B Natriuretic peptide (BNP): 1121 pg/mL — ABNORMAL HIGH

## 2021-06-29 ENCOUNTER — Emergency Department (HOSPITAL_COMMUNITY): Payer: Medicaid Other

## 2021-06-29 ENCOUNTER — Inpatient Hospital Stay (HOSPITAL_COMMUNITY)
Admission: EM | Admit: 2021-06-29 | Discharge: 2021-07-04 | DRG: 291 | Disposition: A | Discharge status: Home / Self Care | Payer: Medicaid Other | Attending: Internal Medicine | Admitting: Internal Medicine

## 2021-06-29 ENCOUNTER — Encounter (HOSPITAL_COMMUNITY): Payer: Self-pay | Admitting: Emergency Medicine

## 2021-06-29 DIAGNOSIS — IMO0001 Reserved for inherently not codable concepts without codable children: Secondary | ICD-10-CM

## 2021-06-29 DIAGNOSIS — J441 Chronic obstructive pulmonary disease with (acute) exacerbation: Secondary | ICD-10-CM | POA: Diagnosis not present

## 2021-06-29 DIAGNOSIS — I429 Cardiomyopathy, unspecified: Secondary | ICD-10-CM

## 2021-06-29 DIAGNOSIS — Z7952 Long term (current) use of systemic steroids: Secondary | ICD-10-CM

## 2021-06-29 DIAGNOSIS — Z791 Long term (current) use of non-steroidal anti-inflammatories (NSAID): Secondary | ICD-10-CM

## 2021-06-29 DIAGNOSIS — M109 Gout, unspecified: Secondary | ICD-10-CM | POA: Diagnosis present

## 2021-06-29 DIAGNOSIS — I130 Hypertensive heart and chronic kidney disease with heart failure and stage 1 through stage 4 chronic kidney disease, or unspecified chronic kidney disease: Principal | ICD-10-CM

## 2021-06-29 DIAGNOSIS — F17210 Nicotine dependence, cigarettes, uncomplicated: Secondary | ICD-10-CM

## 2021-06-29 DIAGNOSIS — Z79899 Other long term (current) drug therapy: Secondary | ICD-10-CM

## 2021-06-29 DIAGNOSIS — E785 Hyperlipidemia, unspecified: Secondary | ICD-10-CM | POA: Diagnosis present

## 2021-06-29 DIAGNOSIS — N179 Acute kidney failure, unspecified: Secondary | ICD-10-CM | POA: Diagnosis not present

## 2021-06-29 DIAGNOSIS — N183 Chronic kidney disease, stage 3 unspecified: Secondary | ICD-10-CM

## 2021-06-29 DIAGNOSIS — F4310 Post-traumatic stress disorder, unspecified: Secondary | ICD-10-CM | POA: Diagnosis present

## 2021-06-29 DIAGNOSIS — I5043 Acute on chronic combined systolic (congestive) and diastolic (congestive) heart failure: Secondary | ICD-10-CM

## 2021-06-29 DIAGNOSIS — Z20822 Contact with and (suspected) exposure to covid-19: Secondary | ICD-10-CM | POA: Diagnosis present

## 2021-06-29 DIAGNOSIS — E1122 Type 2 diabetes mellitus with diabetic chronic kidney disease: Secondary | ICD-10-CM | POA: Diagnosis present

## 2021-06-29 DIAGNOSIS — N1832 Chronic kidney disease, stage 3b: Secondary | ICD-10-CM | POA: Diagnosis present

## 2021-06-29 DIAGNOSIS — E1142 Type 2 diabetes mellitus with diabetic polyneuropathy: Secondary | ICD-10-CM | POA: Diagnosis present

## 2021-06-29 DIAGNOSIS — I160 Hypertensive urgency: Secondary | ICD-10-CM | POA: Diagnosis present

## 2021-06-29 DIAGNOSIS — J4541 Moderate persistent asthma with (acute) exacerbation: Secondary | ICD-10-CM

## 2021-06-29 DIAGNOSIS — I5041 Acute combined systolic (congestive) and diastolic (congestive) heart failure: Secondary | ICD-10-CM

## 2021-06-29 DIAGNOSIS — J9811 Atelectasis: Secondary | ICD-10-CM | POA: Diagnosis present

## 2021-06-29 DIAGNOSIS — Z833 Family history of diabetes mellitus: Secondary | ICD-10-CM

## 2021-06-29 DIAGNOSIS — I700 Atherosclerosis of aorta: Secondary | ICD-10-CM

## 2021-06-29 DIAGNOSIS — I428 Other cardiomyopathies: Secondary | ICD-10-CM | POA: Diagnosis present

## 2021-06-29 DIAGNOSIS — R635 Abnormal weight gain: Secondary | ICD-10-CM

## 2021-06-29 DIAGNOSIS — I129 Hypertensive chronic kidney disease with stage 1 through stage 4 chronic kidney disease, or unspecified chronic kidney disease: Secondary | ICD-10-CM

## 2021-06-29 DIAGNOSIS — E739 Lactose intolerance, unspecified: Secondary | ICD-10-CM | POA: Diagnosis present

## 2021-06-29 DIAGNOSIS — Z930 Tracheostomy status: Secondary | ICD-10-CM

## 2021-06-29 DIAGNOSIS — E1165 Type 2 diabetes mellitus with hyperglycemia: Secondary | ICD-10-CM | POA: Diagnosis not present

## 2021-06-29 DIAGNOSIS — Z809 Family history of malignant neoplasm, unspecified: Secondary | ICD-10-CM

## 2021-06-29 DIAGNOSIS — Z794 Long term (current) use of insulin: Secondary | ICD-10-CM

## 2021-06-29 DIAGNOSIS — B20 Human immunodeficiency virus [HIV] disease: Secondary | ICD-10-CM | POA: Diagnosis present

## 2021-06-29 DIAGNOSIS — Z825 Family history of asthma and other chronic lower respiratory diseases: Secondary | ICD-10-CM

## 2021-06-29 DIAGNOSIS — Z6841 Body Mass Index (BMI) 40.0 and over, adult: Secondary | ICD-10-CM

## 2021-06-29 DIAGNOSIS — I5031 Acute diastolic (congestive) heart failure: Secondary | ICD-10-CM

## 2021-06-29 DIAGNOSIS — E1140 Type 2 diabetes mellitus with diabetic neuropathy, unspecified: Secondary | ICD-10-CM | POA: Diagnosis present

## 2021-06-29 DIAGNOSIS — I509 Heart failure, unspecified: Secondary | ICD-10-CM

## 2021-06-29 DIAGNOSIS — F17200 Nicotine dependence, unspecified, uncomplicated: Secondary | ICD-10-CM

## 2021-06-29 LAB — RESP PANEL BY RT-PCR (FLU A&B, COVID) ARPGX2
Influenza A by PCR: NEGATIVE
Influenza B by PCR: NEGATIVE
SARS Coronavirus 2 by RT PCR: NEGATIVE

## 2021-06-29 LAB — COMPREHENSIVE METABOLIC PANEL
ALT: 111 U/L — ABNORMAL HIGH (ref 0–44)
AST: 143 U/L — ABNORMAL HIGH (ref 15–41)
Albumin: 3.6 g/dL (ref 3.5–5.0)
Alkaline Phosphatase: 81 U/L (ref 38–126)
Anion gap: 14 (ref 5–15)
BUN: 32 mg/dL — ABNORMAL HIGH (ref 6–20)
CO2: 25 mmol/L (ref 22–32)
Calcium: 8.7 mg/dL — ABNORMAL LOW (ref 8.9–10.3)
Chloride: 97 mmol/L — ABNORMAL LOW (ref 98–111)
Creatinine, Ser: 1.85 mg/dL — ABNORMAL HIGH (ref 0.61–1.24)
GFR, Estimated: 45 mL/min — ABNORMAL LOW (ref >60)
Glucose, Bld: 341 mg/dL — ABNORMAL HIGH (ref 70–99)
Potassium: 4.6 mmol/L (ref 3.5–5.1)
Sodium: 136 mmol/L (ref 135–145)
Total Bilirubin: 0.5 mg/dL (ref 0.3–1.2)
Total Protein: 6.8 g/dL (ref 6.5–8.1)

## 2021-06-29 LAB — CBG MONITORING, ED
Glucose-Capillary: 179 mg/dL — ABNORMAL HIGH (ref 70–99)
Glucose-Capillary: 186 mg/dL — ABNORMAL HIGH (ref 70–99)
Glucose-Capillary: 208 mg/dL — ABNORMAL HIGH (ref 70–99)

## 2021-06-29 LAB — I-STAT VENOUS BLOOD GAS, ED
Acid-Base Excess: 8 mmol/L — ABNORMAL HIGH (ref 0.0–2.0)
Bicarbonate: 35.3 mmol/L — ABNORMAL HIGH (ref 20.0–28.0)
Calcium, Ion: 1.1 mmol/L — ABNORMAL LOW (ref 1.15–1.40)
HCT: 38 % — ABNORMAL LOW (ref 39.0–52.0)
Hemoglobin: 12.9 g/dL — ABNORMAL LOW (ref 13.0–17.0)
O2 Saturation: 100 %
Potassium: 5.5 mmol/L — ABNORMAL HIGH (ref 3.5–5.1)
Sodium: 139 mmol/L (ref 135–145)
TCO2: 37 mmol/L — ABNORMAL HIGH (ref 22–32)
pCO2, Ven: 59.3 mmHg (ref 44.0–60.0)
pH, Ven: 7.382 (ref 7.250–7.430)
pO2, Ven: 198 mmHg — ABNORMAL HIGH (ref 32.0–45.0)

## 2021-06-29 LAB — CBC
HCT: 39.5 % (ref 39.0–52.0)
Hemoglobin: 11.6 g/dL — ABNORMAL LOW (ref 13.0–17.0)
MCH: 29.6 pg (ref 26.0–34.0)
MCHC: 29.4 g/dL — ABNORMAL LOW (ref 30.0–36.0)
MCV: 100.8 fL — ABNORMAL HIGH (ref 80.0–100.0)
Platelets: 253 10*3/uL (ref 150–400)
RBC: 3.92 MIL/uL — ABNORMAL LOW (ref 4.22–5.81)
RDW: 18.2 % — ABNORMAL HIGH (ref 11.5–15.5)
WBC: 10.4 10*3/uL (ref 4.0–10.5)
nRBC: 0.2 % (ref 0.0–0.2)

## 2021-06-29 LAB — RPR: RPR Ser Ql: NONREACTIVE

## 2021-06-29 LAB — BRAIN NATRIURETIC PEPTIDE: B Natriuretic Peptide: 151.4 pg/mL — ABNORMAL HIGH (ref 0.0–100.0)

## 2021-06-29 LAB — HIV RNA, RTPCR W/R GT (RTI, PI,INT)
HIV 1 RNA Quant: 131 copies/mL — ABNORMAL HIGH
HIV-1 RNA Quant, Log: 2.12 Log copies/mL — ABNORMAL HIGH

## 2021-06-29 IMAGING — CR DG CHEST 2V
2 series · 2 of 2 positions shown · non-contrast
Comparison: None.

CLINICAL DATA: Shortness of breath.

EXAM:
CHEST - 2 VIEW

[chest lat]
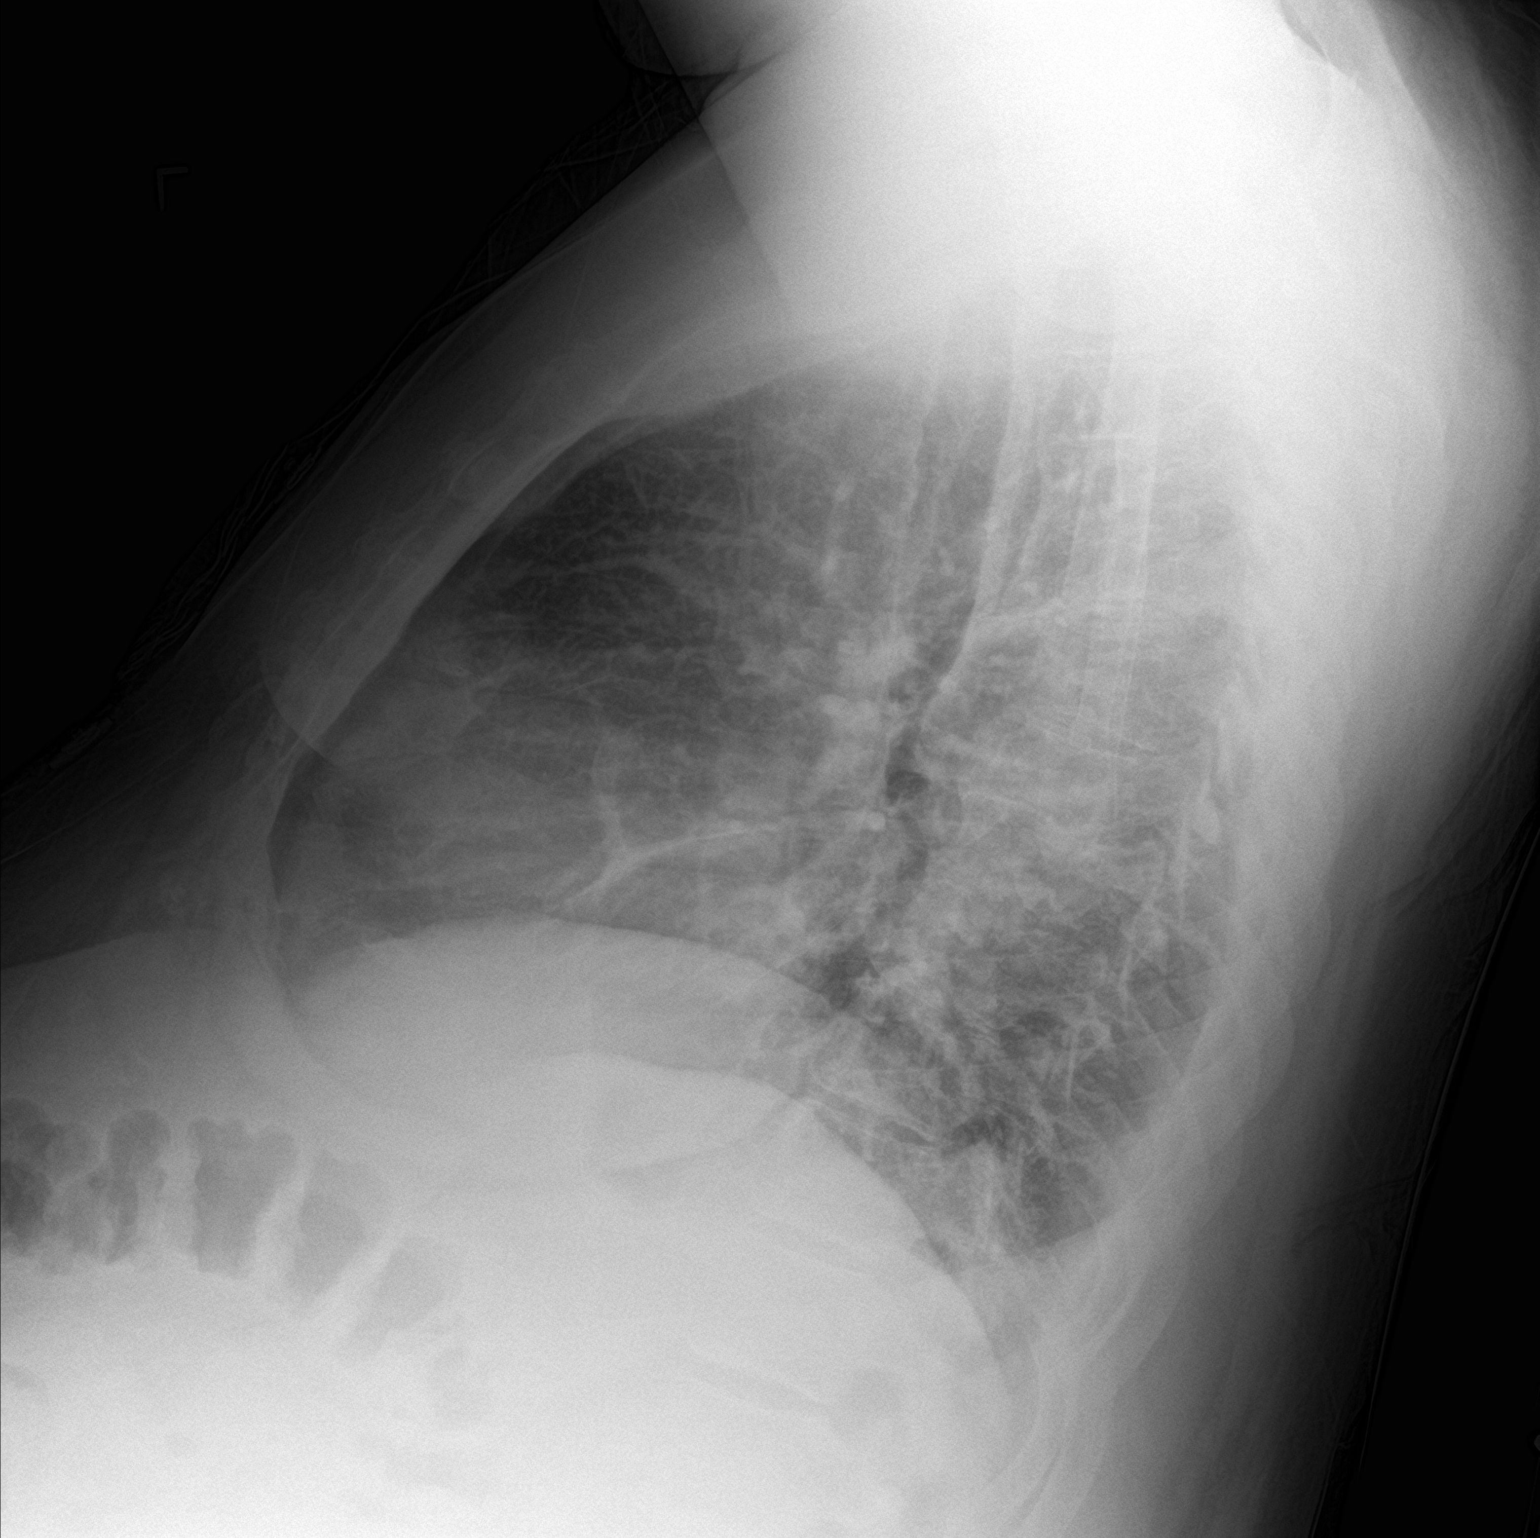

[chest ap]
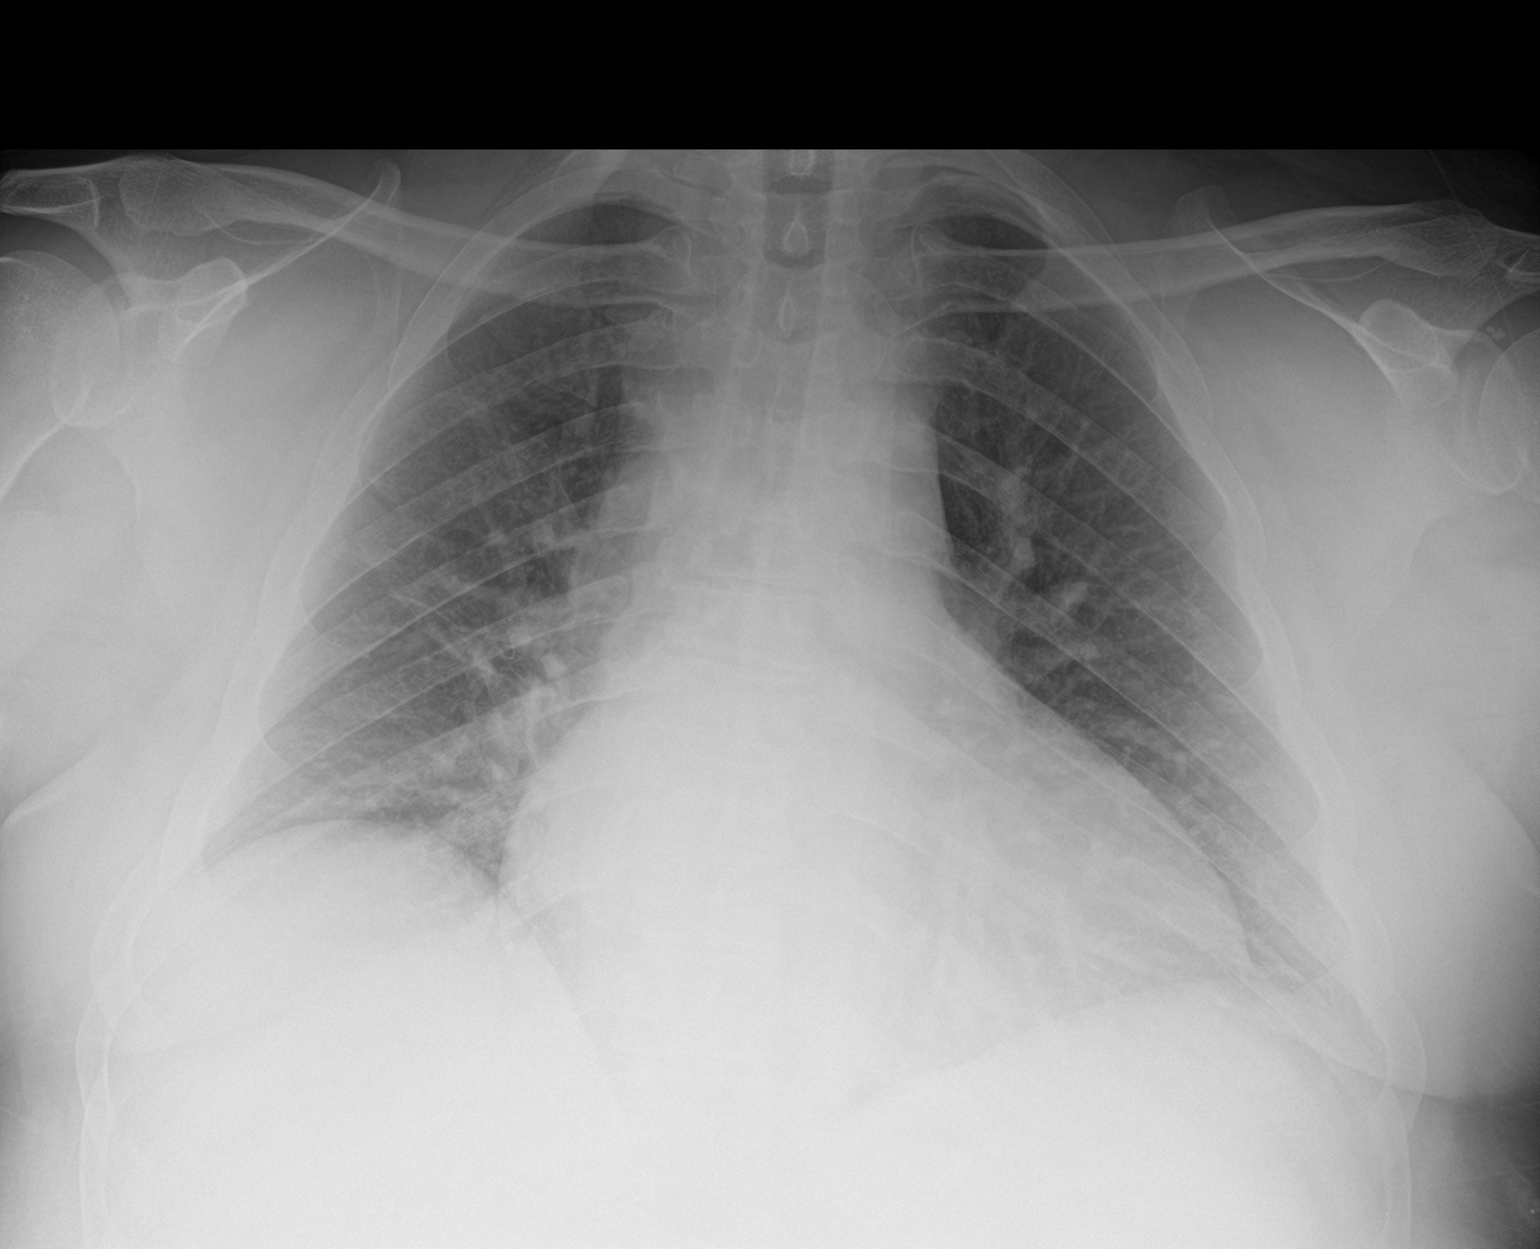

[2 of 2 positions shown; findings below may reference images not displayed]

FINDINGS: Stable cardiomegaly. Minimal bibasilar subsegmental atelectasis is
noted. The visualized skeletal structures are unremarkable.
IMPRESSION: Minimal bibasilar subsegmental atelectasis.

## 2021-06-29 MED ORDER — CLONAZEPAM 0.5 MG PO TABS
0.5000 mg | ORAL_TABLET | Freq: Three times a day (TID) | ORAL | Status: DC | PRN
  Administered 2021-06-30 – 2021-07-04 (×12): 0.5 mg via ORAL
  Filled 2021-06-29 (×12): qty 1

## 2021-06-29 MED ORDER — FUROSEMIDE 10 MG/ML IJ SOLN
60.0000 mg | Freq: Once | INTRAMUSCULAR | Status: AC
  Administered 2021-06-29: 60 mg via INTRAVENOUS
  Filled 2021-06-29: qty 6

## 2021-06-29 MED ORDER — CLONAZEPAM 0.5 MG PO TABS: 0.5000 mg | ORAL_TABLET | Freq: Three times a day (TID) | ORAL | Status: DC

## 2021-06-29 MED ORDER — ACETAMINOPHEN 325 MG PO TABS
650.0000 mg | ORAL_TABLET | Freq: Once | ORAL | Status: AC
  Administered 2021-06-29: 650 mg via ORAL
  Filled 2021-06-29: qty 2

## 2021-06-29 MED ORDER — DOLUTEGRAVIR-LAMIVUDINE 50-300 MG PO TABS
1.0000 | ORAL_TABLET | Freq: Every day | ORAL | Status: DC
  Filled 2021-06-29: qty 1

## 2021-06-29 MED ORDER — ENOXAPARIN SODIUM 80 MG/0.8ML IJ SOSY
0.5000 mg/kg | PREFILLED_SYRINGE | INTRAMUSCULAR | Status: DC
  Administered 2021-06-29 – 2021-07-02 (×4): 65 mg via SUBCUTANEOUS
  Filled 2021-06-29: qty 0.65
  Filled 2021-06-29 (×3): qty 0.8

## 2021-06-29 MED ORDER — IPRATROPIUM-ALBUTEROL 0.5-2.5 (3) MG/3ML IN SOLN
3.0000 mL | Freq: Once | RESPIRATORY_TRACT | Status: AC
  Administered 2021-06-29: 3 mL via RESPIRATORY_TRACT
  Filled 2021-06-29: qty 3

## 2021-06-29 MED ORDER — INSULIN ASPART 100 UNIT/ML IJ SOLN: 8.0000 [IU] | Freq: Once | INTRAMUSCULAR | Status: DC

## 2021-06-29 MED ORDER — ONDANSETRON HCL 4 MG PO TABS: 4.0000 mg | ORAL_TABLET | Freq: Four times a day (QID) | ORAL | Status: DC | PRN

## 2021-06-29 MED ORDER — CLONAZEPAM 0.5 MG PO TABS
0.5000 mg | ORAL_TABLET | Freq: Three times a day (TID) | ORAL | Status: DC
  Administered 2021-06-29: 0.5 mg via ORAL
  Filled 2021-06-29: qty 1

## 2021-06-29 MED ORDER — MAGNESIUM OXIDE -MG SUPPLEMENT 400 (240 MG) MG PO TABS
400.0000 mg | ORAL_TABLET | Freq: Two times a day (BID) | ORAL | Status: DC
  Administered 2021-06-29 – 2021-07-04 (×10): 400 mg via ORAL
  Filled 2021-06-29 (×10): qty 1

## 2021-06-29 MED ORDER — LISINOPRIL 20 MG PO TABS
20.0000 mg | ORAL_TABLET | Freq: Once | ORAL | Status: AC
  Administered 2021-06-29: 20 mg via ORAL
  Filled 2021-06-29: qty 1

## 2021-06-29 MED ORDER — ONDANSETRON HCL 4 MG/2ML IJ SOLN: 4.0000 mg | Freq: Four times a day (QID) | INTRAMUSCULAR | Status: DC | PRN

## 2021-06-29 MED ORDER — IRBESARTAN 150 MG PO TABS
150.0000 mg | ORAL_TABLET | Freq: Every day | ORAL | Status: DC
  Administered 2021-06-30: 150 mg via ORAL
  Filled 2021-06-29 (×2): qty 1

## 2021-06-29 MED ORDER — DOXYCYCLINE HYCLATE 100 MG PO TABS
100.0000 mg | ORAL_TABLET | Freq: Two times a day (BID) | ORAL | Status: DC
  Administered 2021-06-29 – 2021-07-04 (×10): 100 mg via ORAL
  Filled 2021-06-29 (×10): qty 1

## 2021-06-29 MED ORDER — IPRATROPIUM-ALBUTEROL 0.5-2.5 (3) MG/3ML IN SOLN: 3.0000 mL | Freq: Four times a day (QID) | RESPIRATORY_TRACT | Status: DC | PRN

## 2021-06-29 MED ORDER — HYDRALAZINE HCL 20 MG/ML IJ SOLN
10.0000 mg | Freq: Once | INTRAMUSCULAR | Status: AC
  Administered 2021-06-29: 10 mg via INTRAVENOUS
  Filled 2021-06-29: qty 1

## 2021-06-29 MED ORDER — HYDRALAZINE HCL 25 MG PO TABS
25.0000 mg | ORAL_TABLET | Freq: Three times a day (TID) | ORAL | Status: DC
  Administered 2021-06-30: 25 mg via ORAL
  Filled 2021-06-29: qty 1

## 2021-06-29 MED ORDER — OXYCODONE HCL 5 MG PO TABS
5.0000 mg | ORAL_TABLET | Freq: Once | ORAL | Status: AC
  Administered 2021-06-29: 5 mg via ORAL
  Filled 2021-06-29: qty 1

## 2021-06-29 MED ORDER — INSULIN ASPART 100 UNIT/ML IJ SOLN
0.0000 [IU] | Freq: Every day | INTRAMUSCULAR | Status: DC
  Administered 2021-07-03: 2 [IU] via SUBCUTANEOUS

## 2021-06-29 MED ORDER — INSULIN ASPART 100 UNIT/ML IJ SOLN
0.0000 [IU] | Freq: Three times a day (TID) | INTRAMUSCULAR | Status: DC
  Administered 2021-06-30 (×2): 8 [IU] via SUBCUTANEOUS
  Administered 2021-06-30: 3 [IU] via SUBCUTANEOUS
  Administered 2021-07-01: 8 [IU] via SUBCUTANEOUS
  Administered 2021-07-01: 5 [IU] via SUBCUTANEOUS
  Administered 2021-07-01: 2 [IU] via SUBCUTANEOUS
  Administered 2021-07-02: 5 [IU] via SUBCUTANEOUS
  Administered 2021-07-02: 3 [IU] via SUBCUTANEOUS
  Administered 2021-07-02: 5 [IU] via SUBCUTANEOUS
  Administered 2021-07-03 (×2): 3 [IU] via SUBCUTANEOUS
  Administered 2021-07-03 – 2021-07-04 (×2): 5 [IU] via SUBCUTANEOUS
  Administered 2021-07-04: 3 [IU] via SUBCUTANEOUS

## 2021-06-29 MED ORDER — ACETAMINOPHEN 325 MG PO TABS
650.0000 mg | ORAL_TABLET | Freq: Four times a day (QID) | ORAL | Status: DC | PRN
  Administered 2021-06-30 – 2021-07-04 (×10): 650 mg via ORAL
  Filled 2021-06-29 (×11): qty 2

## 2021-06-29 MED ORDER — SENNOSIDES-DOCUSATE SODIUM 8.6-50 MG PO TABS
1.0000 | ORAL_TABLET | Freq: Every evening | ORAL | Status: DC | PRN
Start: 2021-06-29 — End: 2021-07-04

## 2021-06-29 MED ORDER — METOPROLOL TARTRATE 50 MG PO TABS
50.0000 mg | ORAL_TABLET | Freq: Two times a day (BID) | ORAL | Status: DC
  Administered 2021-06-29 – 2021-07-02 (×6): 50 mg via ORAL
  Filled 2021-06-29: qty 2
  Filled 2021-06-29: qty 1
  Filled 2021-06-29: qty 2
  Filled 2021-06-29 (×3): qty 1

## 2021-06-29 MED ORDER — IRBESARTAN 150 MG PO TABS: 150.0000 mg | ORAL_TABLET | Freq: Every day | ORAL | Status: DC

## 2021-06-29 MED ORDER — OXYCODONE HCL 5 MG PO TABS
10.0000 mg | ORAL_TABLET | Freq: Once | ORAL | Status: AC
  Administered 2021-06-29: 10 mg via ORAL
  Filled 2021-06-29: qty 2

## 2021-06-29 MED ORDER — IPRATROPIUM-ALBUTEROL 0.5-2.5 (3) MG/3ML IN SOLN: 3.0000 mL | Freq: Once | RESPIRATORY_TRACT | Status: DC

## 2021-06-29 MED ORDER — TRAZODONE HCL 50 MG PO TABS
50.0000 mg | ORAL_TABLET | Freq: Every day | ORAL | Status: DC
  Administered 2021-06-29 – 2021-07-04 (×5): 50 mg via ORAL
  Filled 2021-06-29 (×6): qty 1

## 2021-06-29 MED ORDER — HYDRALAZINE HCL 25 MG PO TABS
25.0000 mg | ORAL_TABLET | Freq: Once | ORAL | Status: AC
  Administered 2021-06-29: 25 mg via ORAL
  Filled 2021-06-29: qty 1

## 2021-06-29 MED ORDER — METHYLPREDNISOLONE SODIUM SUCC 125 MG IJ SOLR
125.0000 mg | Freq: Once | INTRAMUSCULAR | Status: AC
  Administered 2021-06-29: 125 mg via INTRAVENOUS
  Filled 2021-06-29: qty 2

## 2021-06-29 MED ORDER — ACETAMINOPHEN 650 MG RE SUPP: 650.0000 mg | Freq: Four times a day (QID) | RECTAL | Status: DC | PRN

## 2021-06-29 MED ORDER — FUROSEMIDE 20 MG PO TABS: 40.0000 mg | ORAL_TABLET | Freq: Every day | ORAL | Status: DC

## 2021-06-29 MED ORDER — PREDNISONE 20 MG PO TABS
60.0000 mg | ORAL_TABLET | Freq: Every day | ORAL | Status: DC
  Administered 2021-06-30 – 2021-07-01 (×2): 60 mg via ORAL
  Filled 2021-06-29 (×2): qty 3

## 2021-06-29 MED ORDER — INSULIN GLARGINE-YFGN 100 UNIT/ML ~~LOC~~ SOLN
5.0000 [IU] | Freq: Every day | SUBCUTANEOUS | Status: DC
  Administered 2021-06-29 – 2021-07-04 (×6): 5 [IU] via SUBCUTANEOUS
  Filled 2021-06-29 (×7): qty 0.05

## 2021-06-29 MED ORDER — METOPROLOL TARTRATE 5 MG/5ML IV SOLN
5.0000 mg | Freq: Four times a day (QID) | INTRAVENOUS | Status: DC | PRN
  Administered 2021-06-30: 5 mg via INTRAVENOUS
  Filled 2021-06-29: qty 5

## 2021-06-29 MED ORDER — FAMOTIDINE 20 MG PO TABS
40.0000 mg | ORAL_TABLET | Freq: Every day | ORAL | Status: DC
  Administered 2021-06-30 – 2021-07-04 (×5): 40 mg via ORAL
  Filled 2021-06-29 (×5): qty 2

## 2021-06-29 MED ORDER — FLUTICASONE FUROATE-VILANTEROL 100-25 MCG/ACT IN AEPB
1.0000 | INHALATION_SPRAY | Freq: Every day | RESPIRATORY_TRACT | Status: DC
  Administered 2021-07-01 – 2021-07-04 (×4): 1 via RESPIRATORY_TRACT
  Filled 2021-06-29: qty 28

## 2021-06-29 MED ORDER — GABAPENTIN 300 MG PO CAPS
600.0000 mg | ORAL_CAPSULE | Freq: Three times a day (TID) | ORAL | Status: DC
  Administered 2021-06-29 – 2021-07-04 (×14): 600 mg via ORAL
  Filled 2021-06-29 (×14): qty 2

## 2021-06-29 NOTE — ED Notes (Signed)
MD made aware of pt BP and pt reporting 10/10 pain- med orders put in and MD now at bedside

## 2021-06-29 NOTE — ED Notes (Signed)
MD Chotiner at bedside

## 2021-06-29 NOTE — ED Provider Notes (Signed)
Emergency Medicine Provider Triage Evaluation Note  Bruce Hunter , a 45 y.o. male  was evaluated in triage.  Pt complains of SOB x 2 weeks. Patient was discharged from hospital 10/1 and reports bilateral leg swelling and ankle pain since. No injuries. Denies missing lasix dosing, good urine output.   Review of Systems  Positive: SOB, leg swelling, ankle pain Negative: CP, abdominal pain, lightheadedness, fevers  Physical Exam  BP (!) 183/113 (BP Location: Left Arm)   Pulse (!) 103   Temp 98.7 F (37.1 C) (Oral)   Resp (!) 22   Ht 5\' 10"  (1.778 m)   Wt 132 kg   SpO2 93%   BMI 41.75 kg/m  Gen:   Awake, no distress   Resp:  Normal effort  MSK:   Moves extremities without difficulty  Other:  Bilateral 2+ pitting edema to knees, sensation in tact, wheezing to auscultation of all lung fields  Medical Decision Making  Medically screening exam initiated at 11:54 AM.  Appropriate orders placed.  EMRIC KOWALEWSKI was informed that the remainder of the evaluation will be completed by another provider, this initial triage assessment does not replace that evaluation, and the importance of remaining in the ED until their evaluation is complete.     Kateri Plummer, PA-C 06/29/21 1155    Lennice Sites, DO 06/29/21 1326

## 2021-06-29 NOTE — ED Notes (Signed)
RN aware of pts O2 levels

## 2021-06-29 NOTE — ED Triage Notes (Signed)
Pt endorses SOB for 1-2 weeks. Pt having leg swelling and ankle pain. Denies missing any lasix and reports good urine output.

## 2021-06-29 NOTE — ED Provider Notes (Signed)
[facility] EMERGENCY DEPARTMENT Provider Note   CSN: [id number] Arrival date & time: 06/29/21  1133     History Chief Complaint  Patient presents with   Shortness of Breath    Bruce Hunter is a 45 y.o. male with a history of HIV/AIDS, hypertension, obesity, hypertensive urgency, presenting to [facility] apartment shortness of breath.  The patient reports bilateral leg swelling shortness of breath worsening for the past week.  He reports compliance with his home medications including his Lasix.  Overall he does appear to be a poor historian.  Per medical records review patient was hospitalized and discharged at the start of the month in October for respiratory failure that required mechanical intubation, ventilation, ultimately tracheostomy.  He was also treated for PCP pneumonia at that time.  He went into shock and developed acute renal failure requiring CVVHD brielfy.  He was ultimately weaned off of the ventilator and his renal function recovered.  He was started on low-dose insulin for type 2 diabetes at that time with 5 units long-acting insulin daily.  He was seen again by the infectious disease clinic as recently as 5 days ago.  He was noted to be hypertensive in the office.  He was also noted to be short of breath but not hypoxic.  Infectious disease doctor recommended visiting the hospital for volume overload and admission.  HPI     Past Medical History:  Diagnosis Date   Anxiety 05/07/2021   Asthma    Diabetes mellitus without complication ([place])    Gout    Hyperlipidemia    Hypertension    Hypertensive urgency 06/24/2021   Obesity    Obesity 06/24/2021   Pulmonary edema 06/24/2021    Patient Active Problem List   Diagnosis Date Noted   COPD exacerbation ([place]) 06/29/2021   CKD (chronic kidney disease) stage 3, GFR 30-59 ml/min ([place]) 06/29/2021   Hypertensive urgency 06/24/2021   Pulmonary edema 06/24/2021   Obesity 06/24/2021   Anxiety 05/07/2021    Pain in joints of both feet    AKI (acute kidney injury) ([place])    SIRS (systemic inflammatory response syndrome) (HCC)    Leukocytosis    Bilateral swelling of feet    Debility 03/30/2021   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   Gout attack 03/30/2021   Acute left ankle pain    Acute pain of left shoulder    Altered mental status    Status post tracheostomy ([place])    Severe sepsis ([place])    Acute metabolic encephalopathy    Pressure injury of skin 03/09/2021   Septic shock ([place]) 03/08/2021   Acute respiratory failure with hypoxemia ([place]) 03/05/2021   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 ([place]) 03/05/2021   Elevated LFTs 03/05/2021   Multifocal pneumonia 03/04/2021   Candida esophagitis ([place]) 02/19/2021   Neuropathic pain 02/19/2021   Polypharmacy 02/19/2021   Cough    N&V (nausea and vomiting)    AIDS (acquired immune deficiency syndrome) ([place])    Esophageal dysphagia    Gastroesophageal reflux disease with esophagitis without hemorrhage    Gastrointestinal hemorrhage    Abdominal pain    Creatinine elevation    Hypoglycemia 02/12/2021   HTN (hypertension) 02/12/2021   Anemia 02/12/2021   Acute renal failure ([place]) 02/11/2021    Past Surgical History:  Procedure Laterality Date   BIOPSY  02/15/2021   Procedure: BIOPSY;  Surgeon: Ronnette Juniper,  MD;  Location: [facility];  Service: Gastroenterology;;   COLONOSCOPY WITH PROPOFOL N/A 02/15/2021   Procedure: COLONOSCOPY WITH PROPOFOL;  Surgeon: Ronnette Juniper, MD;  Location: [facility];  Service: Gastroenterology;  Laterality: N/A;   ESOPHAGOGASTRODUODENOSCOPY (EGD) WITH PROPOFOL N/A 02/15/2021   Procedure: ESOPHAGOGASTRODUODENOSCOPY (EGD) WITH PROPOFOL;  Surgeon: Ronnette Juniper, MD;  Location: [facility];  Service: Gastroenterology;  Laterality: N/A;   GANGLION CYST EXCISION Right 02/26/2020   Procedure: EXCISION TOPHUS RIGHT FOOT;  Surgeon:  Evelina Bucy, DPM;  Location: [facility];  Service: Podiatry;  Laterality: Right;   NO PAST SURGERIES         Family History  Problem Relation Age of Onset   Asthma Mother    Cancer Mother    Diabetes Mother    Obesity Mother    Obesity Sister    Diabetes Brother    Obesity Brother    Cancer Maternal Grandmother    Diabetes Maternal Grandmother    Diabetes Other     Social History   Tobacco Use   Smoking status: Every Day    Packs/day: 1.00    Types: Cigarettes   Smokeless tobacco: Never  Substance Use Topics   Alcohol use: Yes   Drug use: Yes    Frequency: 1.0 times per week    Types: Marijuana    Comment: marijuana at midnight last night; daily    Home Medications Prior to Admission medications   Medication Sig Start Date End Date Taking? Authorizing Provider  acetaminophen (TYLENOL) 325 MG tablet Take 1 tablet (325 mg total) by mouth every 4 (four) hours as needed for mild pain. 06/25/21   Raulkar, Clide Deutscher, MD  clonazePAM (KLONOPIN) 0.5 MG tablet Take 1 tablet (0.5 mg total) by mouth 3 (three) times daily. 06/25/21   Raulkar, Clide Deutscher, MD  colchicine 0.6 MG tablet Take 0.6 mg by mouth daily. 06/21/21   [provider]  cyanocobalamin 1000 MCG tablet Take 1 tablet (1,000 mcg total) by mouth daily. 05/03/21   Love, Ivan Anchors, PA-C  dapsone 100 MG tablet Take 1 tablet (100 mg total) by mouth daily. Restart on 04/03/2021 05/07/21   Truman Hayward, MD  diclofenac Sodium (VOLTAREN) 1 % GEL Apply 2 g topically 4 (four) times daily. 05/03/21   Love, Ivan Anchors, PA-C  Dolutegravir-lamiVUDine (DOVATO) 50-300 MG TABS Take 1 tablet by mouth daily. 05/07/21 07/10/21  Truman Hayward, MD  famotidine (PEPCID) 40 MG tablet Take 1 tablet (40 mg total) by mouth daily. 06/02/21   Raulkar, Clide Deutscher, MD  ferrous sulfate 325 (65 FE) MG EC tablet Take 1 tablet by mouth daily. 06/03/21   [provider]  furosemide (LASIX) 40 MG tablet Take 1 tablet  (40 mg total) by mouth every Monday, Wednesday, and Friday. 05/03/21   Love, Ivan Anchors, PA-C  gabapentin (NEURONTIN) 300 MG capsule Take 2 capsules (600 mg total) by mouth 3 (three) times daily. 05/03/21   Love, Ivan Anchors, PA-C  hydrALAZINE (APRESOLINE) 25 MG tablet Take 1 tablet (25 mg total) by mouth every 8 (eight) hours. Patient not taking: No sig reported 05/03/21   Love, Ivan Anchors, PA-C  insulin glargine (LANTUS SOLOSTAR) 100 UNIT/ML Solostar Pen Inject 5 Units into the skin daily. 05/03/21   Love, Ivan Anchors, PA-C  Insulin Pen Needle 32G X 4 MM MISC use as directed 05/03/21   Love, Ivan Anchors, PA-C  lidocaine (LIDODERM) 5 % Place 2 patches onto the skin daily. Remove &  Discard patch within 12 hours or as directed by MD Patient not taking: Reported on 06/24/2021 05/03/21   Love, Ivan Anchors, PA-C  lisinopril (ZESTRIL) 20 MG tablet Take 20 mg by mouth daily. 05/25/21   [provider]  magnesium oxide (MAG-OX) 400 MG tablet Take 1 tablet (400 mg total) by mouth 2 (two) times daily. 05/03/21   Love, Ivan Anchors, PA-C  MAGNESIUM-OXIDE 400 (240 Mg) MG tablet Take 1 tablet by mouth 2 (two) times daily. 06/03/21   [provider]  meloxicam (MOBIC) 15 MG tablet Take 15 mg by mouth daily. 06/01/21   [provider]  methocarbamol (ROBAXIN) 500 MG tablet Take 1 tablet (500 mg total) by mouth every 6 (six) hours as needed for muscle spasms. 05/03/21   Love, Ivan Anchors, PA-C  metoprolol tartrate (LOPRESSOR) 50 MG tablet Take 1 tablet (50 mg total) by mouth 2 (two) times daily. 05/03/21   Love, Ivan Anchors, PA-C  morphine (MS CONTIN) 15 MG 12 hr tablet Take 1 tablet (15 mg total) by mouth every 12 (twelve) hours. Patient not taking: No sig reported 05/03/21   Love, Ivan Anchors, PA-C  olmesartan (BENICAR) 20 MG tablet Take by mouth. 06/03/21   [provider]  omeprazole (PRILOSEC) 20 MG capsule Take 20 mg by mouth daily. 06/01/21   [provider]  Oxycodone HCl 10 MG TABS Take 1 tablet (10 mg  total) by mouth every 4 (four) hours as needed. MAX 5 TABS PER DAY AS NEEDED FOR SEVERE PAIN Patient not taking: Reported on 06/24/2021 05/03/21   Love, Ivan Anchors, PA-C  polyethylene glycol (MIRALAX / GLYCOLAX) 17 g packet Take 17 g by mouth daily as needed for moderate constipation. Patient not taking: Reported on 06/24/2021 03/30/21   Mariel Aloe, MD  predniSONE (DELTASONE) 10 MG tablet Take 3 tablets (30 mg total) by mouth daily with breakfast. 05/04/21   Love, Ivan Anchors, PA-C  tamsulosin (FLOMAX) 0.4 MG CAPS capsule Take 1 capsule (0.4 mg total) by mouth daily after supper. 05/03/21   Love, Ivan Anchors, PA-C  traZODone (DESYREL) 50 MG tablet Take 50 mg by mouth at bedtime.    [provider]    Allergies    Lactose intolerance (gi)  Review of Systems   Review of Systems  Constitutional:  Negative for chills and fever.  HENT:  Negative for ear pain and sore throat.   Eyes:  Negative for pain and visual disturbance.  Respiratory:  Positive for shortness of breath. Negative for cough.   Cardiovascular:  Negative for chest pain and palpitations.  Gastrointestinal:  Negative for abdominal pain and vomiting.  Genitourinary:  Negative for dysuria and hematuria.  Musculoskeletal:  Positive for arthralgias and myalgias.  Neurological:  Negative for seizures and syncope.  All other systems reviewed and are negative.  Physical Exam Updated Vital Signs BP (!) 177/85   Pulse 74   Temp 97.7 F (36.5 C) (Oral)   Resp 20   Ht 5\' 10"  (1.778 m)   Wt 132 kg   SpO2 96%   BMI 41.75 kg/m   Physical Exam Constitutional:      General: He is not in acute distress.    Appearance: He is obese.  HENT:     Head: Normocephalic and atraumatic.  Eyes:     Conjunctiva/sclera: Conjunctivae normal.     Pupils: Pupils are equal, round, and reactive to light.  Cardiovascular:     Rate and Rhythm: Normal rate and regular rhythm.  Pulmonary:     Effort: Pulmonary effort is normal.     Comments:  Bilateral wheezing Abdominal:     General: There is no distension.     Tenderness: There is no abdominal tenderness.  Musculoskeletal:     Right lower leg: Edema present.     Left lower leg: Edema present.  Skin:    General: Skin is warm and dry.  Neurological:     General: No focal deficit present.     Mental Status: He is alert. Mental status is at baseline.  Psychiatric:        Mood and Affect: Mood normal.        Behavior: Behavior normal.    ED Results / Procedures / Treatments   Labs (all labs ordered are listed, but only abnormal results are displayed) Labs Reviewed  CBC - Abnormal; Notable for the following components:      Result Value   RBC 3.92 (*)    Hemoglobin 11.6 (*)    MCV 100.8 (*)    MCHC 29.4 (*)    RDW 18.2 (*)    All other components within normal limits  COMPREHENSIVE METABOLIC PANEL - Abnormal; Notable for the following components:   Chloride 97 (*)    Glucose, Bld 341 (*)    BUN 32 (*)    Creatinine, Ser 1.85 (*)    Calcium 8.7 (*)    AST 143 (*)    ALT 111 (*)    GFR, Estimated 45 (*)    All other components within normal limits  BRAIN NATRIURETIC PEPTIDE - Abnormal; Notable for the following components:   B Natriuretic Peptide 151.4 (*)    All other components within normal limits  BASIC METABOLIC PANEL - Abnormal; Notable for the following components:   Glucose, Bld 293 (*)    BUN 36 (*)    Creatinine, Ser 1.99 (*)    Calcium 8.8 (*)    GFR, Estimated 41 (*)    All other components within normal limits  CBC - Abnormal; Notable for the following components:   WBC 12.1 (*)    RBC 3.91 (*)    Hemoglobin 11.7 (*)    HCT 38.2 (*)    RDW 18.2 (*)    All other components within normal limits  HEMOGLOBIN A1C - Abnormal; Notable for the following components:   Hgb A1c MFr Bld 6.6 (*)    All other components within normal limits  I-STAT VENOUS BLOOD GAS, ED - Abnormal; Notable for the following components:   pO2, Ven 198.0 (*)     Bicarbonate 35.3 (*)    TCO2 37 (*)    Acid-Base Excess 8.0 (*)    Potassium 5.5 (*)    Calcium, Ion 1.10 (*)    HCT 38.0 (*)    Hemoglobin 12.9 (*)    All other components within normal limits  CBG MONITORING, ED - Abnormal; Notable for the following components:   Glucose-Capillary 208 (*)    All other components within normal limits  CBG MONITORING, ED - Abnormal; Notable for the following components:   Glucose-Capillary 186 (*)    All other components within normal limits  CBG MONITORING, ED - Abnormal; Notable for the following components:   Glucose-Capillary 179 (*)    All other components within normal limits  CBG MONITORING, ED - Abnormal; Notable for the following components:   Glucose-Capillary 260 (*)    All other components within normal limits  RESP PANEL BY RT-PCR (FLU A&B,  COVID) [id number]    EKG EKG Interpretation  Date/Time:  Tuesday June 29 2021 11:44:27 EDT Ventricular Rate:  101 PR Interval:  158 QRS Duration: 98 QT Interval:  350 QTC Calculation: 453 R Axis:   105 Text Interpretation: Sinus tachycardia Confirmed by Octaviano Glow [id number]) on 06/29/2021 6:26:38 PM  Radiology DG Chest 2 View  Result Date: 06/29/2021 CLINICAL DATA:  Shortness of breath. EXAM: CHEST - 2 VIEW COMPARISON:  None. FINDINGS: Stable cardiomegaly. Minimal bibasilar subsegmental atelectasis is noted. The visualized skeletal structures are unremarkable. IMPRESSION: Minimal bibasilar subsegmental atelectasis. Electronically Signed   By: Marijo Conception M.D.   On: 06/29/2021 12:48    Procedures Procedures   Medications Ordered in ED Medications  insulin glargine-yfgn (SEMGLEE) injection 5 Units (5 Units Subcutaneous Given 06/29/21 2129)  metoprolol tartrate (LOPRESSOR) tablet 50 mg (50 mg Oral Given 06/30/21 0933)  hydrALAZINE (APRESOLINE) tablet 25 mg (25 mg Oral Given 06/30/21 0518)  traZODone (DESYREL) tablet 50 mg (50 mg Oral Given 06/29/21 2315)  famotidine (PEPCID) tablet  40 mg (40 mg Oral Given 06/30/21 0932)  gabapentin (NEURONTIN) capsule 600 mg (600 mg Oral Given 06/30/21 0932)  magnesium oxide (MAG-OX) tablet 400 mg (400 mg Oral Given 06/30/21 0932)  doxycycline (VIBRA-TABS) tablet 100 mg (100 mg Oral Given 06/30/21 0932)  enoxaparin (LOVENOX) injection 65 mg (65 mg Subcutaneous Given 06/29/21 2317)  acetaminophen (TYLENOL) tablet 650 mg (650 mg Oral Given 06/30/21 0047)    Or  acetaminophen (TYLENOL) suppository 650 mg ( Rectal See Alternative 06/30/21 0047)  ondansetron (ZOFRAN) tablet 4 mg (has no administration in time range)    Or  ondansetron (ZOFRAN) injection 4 mg (has no administration in time range)  senna-docusate (Senokot-S) tablet 1 tablet (has no administration in time range)  metoprolol tartrate (LOPRESSOR) injection 5 mg (has no administration in time range)  fluticasone furoate-vilanterol (BREO ELLIPTA) 100-25 MCG/ACT 1 puff (has no administration in time range)  predniSONE (DELTASONE) tablet 60 mg (60 mg Oral Given 06/30/21 0932)  insulin aspart (novoLOG) injection 0-15 Units (8 Units Subcutaneous Given 06/30/21 0929)  insulin aspart (novoLOG) injection 0-5 Units (0 Units Subcutaneous Hold 06/29/21 2352)  clonazePAM (KLONOPIN) tablet 0.5 mg (0.5 mg Oral Given 06/30/21 0942)  irbesartan (AVAPRO) tablet 150 mg (150 mg Oral Given 06/30/21 0932)  dolutegravir (TIVICAY) tablet 50 mg (has no administration in time range)    And  lamiVUDine (EPIVIR) tablet 300 mg (has no administration in time range)  oxyCODONE (Oxy IR/ROXICODONE) immediate release tablet 10 mg (10 mg Oral Given 06/30/21 0421)  ipratropium-albuterol (DUONEB) 0.5-2.5 (3) MG/3ML nebulizer solution 3 mL (has no administration in time range)  furosemide (LASIX) tablet 40 mg (40 mg Oral Given 06/30/21 0932)  acetaminophen (TYLENOL) tablet 650 mg (650 mg Oral Given 06/29/21 1710)  oxyCODONE (Oxy IR/ROXICODONE) immediate release tablet 5 mg (5 mg Oral Given 06/29/21 1855)   hydrALAZINE (APRESOLINE) tablet 25 mg (25 mg Oral Given 06/29/21 1855)  lisinopril (ZESTRIL) tablet 20 mg (20 mg Oral Given 06/29/21 1855)  furosemide (LASIX) injection 60 mg (60 mg Intravenous Given 06/29/21 1852)  ipratropium-albuterol (DUONEB) 0.5-2.5 (3) MG/3ML nebulizer solution 3 mL (3 mLs Nebulization Given 06/29/21 1854)  oxyCODONE (Oxy IR/ROXICODONE) immediate release tablet 10 mg (10 mg Oral Given 06/29/21 2116)  ipratropium-albuterol (DUONEB) 0.5-2.5 (3) MG/3ML nebulizer solution 3 mL (3 mLs Nebulization Given 06/29/21 2133)  methylPREDNISolone sodium succinate (SOLU-MEDROL) 125 mg/2 mL injection 125 mg (125 mg Intravenous Given 06/29/21 2119)  hydrALAZINE (APRESOLINE) injection 10 mg (10  mg Intravenous Given 06/29/21 2117)    ED Course  I have reviewed the triage vital signs and the nursing notes.  Pertinent labs & imaging results that were available during my care of the patient were reviewed by me and considered in my medical decision making (see chart for details).  Dyspnea - ddx includes PNA vs CHF vs COPD vs anemia vs other Labs, images, ECG personally reviewed and interpreted, showing Cr 1.8 (baseline at discharge was close to 1.6), hgb stable, WBC wnl, pH 7.4, pCO2 normal, BNP 151, Covid negative.  EKG with no significant changes from prior tracing.  Xray chest with bibasilar atelectasis.  No fever or leukocytosis to suspect PNA or sepsis.  Patient given IV diuresis and duonebs + solumedrol for suspected combined CHF and COPD exacerbation.  Thankfully no oxygen requirement at this time.  I reviewed his prior hospitalization course and outpatient records including his infectious disease office visit  Medications ordered for hypertension.  No symptoms of hypertensive emergency at this time.  Clinical Course as of 06/30/21 1005  Tue Jun 29, 2021  2059 My reassessment the patient is still wheezing and feels short of breath.  I suspect this is most consistent with COPD with  some possible congestive heart failure component, he has produced about a liter of urine.  He will need to continue DuoNebs and be watched overnight.  Iv solumedrol ordered.  He remains quite hypertensive, have also ordered some IV hydralazine [MT]  2101 Paged unassigned for admission [MT]  2118 Admitted to hospitalist. [MT]    Clinical Course User Index [MT] Wyvonnia Dusky, MD    Final Clinical Impression(s) / ED Diagnoses Final diagnoses:  COPD exacerbation ([place])  Congestive heart failure, unspecified HF chronicity, unspecified heart failure type [facility])    Rx / DC Orders ED Discharge Orders     None        Wyvonnia Dusky, MD 06/30/21 1005

## 2021-06-29 NOTE — H&P (Signed)
History and Physical    Bruce Hunter:[id number] DOB: 03/13/1976 DOA: 06/29/2021  PCP: Elwyn Reach, MD   Patient coming from: Home  Chief Complaint: SOB, wheezing  HPI: Bruce Hunter is a 45 y.o. male with medical history significant for HIV/AIDS, hypertension, COPD, DMT2 with neuropathy, obesity, CKD 3, gout, CHF, tobacco use who presents with complaint of SOB and wheezing.  He was admitted a few weeks ago with CHF exacerbation and respiratory failure that required mechanical intubation and ultimately a tracheostomy.  At that time he was treated for PCP pneumonia and developed renal failure secondary to septic shock that has resolved.  He is followed by infectious disease which he saw a few days ago for follow-up.  He reports he has been taking all his medications as prescribed although he is a poor historian.  States he has not had any fevers or chills and no change in appetite.  He has chronic edema of his legs which is unchanged.  Reports he has not been around anyone sick that he knows of.  He does smoke cigarettes which he states he is stopping and he smokes marijuana most days of the week.  Last marijuana joint was today.  Denies alcohol use.  He denies any nausea vomiting nominal pain dysuria chest pain or palpitations.  ED Course: In the emergency room patient has had hypertensive urgency with blood pressure ranging from 170-210/100-135.  He has been given p.o. and IV hydralazine in the emergency room.  Was given multiple doses of DuoNeb treatments in the emergency room with some improvement in his shortness of breath but not back to baseline and continues to have audible wheezing.  Labs reveal sodium 136 potassium 4.6 chloride 97 bicarb 25 creatinine 1.85 which is baseline BUN 32 alkaline phosphatase 81 AST 143 ALT 111 bilirubin 0.5 glucose 341.  BBC 10,400 hemoglobin 11.6 hematocrit 39.5 platelets 253,000 BNP 151.4 which is improved from previous level.  Chest x-ray shows basilar  atelectasis but no acute infiltrate or consolidation.  Hospitalist service been asked to evaluate and manage patient overnight  Review of Systems:  General: Denies fever, chills, weight loss, night sweats.  Denies dizziness.  Denies change in appetite HENT: Denies head trauma, headache, denies change in hearing, tinnitus. Denies nasal  bleeding.  Denies sore throat.  Denies difficulty swallowing Eyes: Denies blurry vision, pain in eye, drainage.  Denies discoloration of eyes. Neck: Denies pain.  Denies swelling.  Denies pain with movement. Cardiovascular: Denies chest pain, palpitations. Reports chronic edema.  Denies orthopnea Respiratory: Reports shortness of breath, wheezing.  Denies sputum production Gastrointestinal: Denies abdominal pain, swelling.  Denies nausea, vomiting, diarrhea.  Denies melena.  Denies hematemesis. Musculoskeletal: Denies limitation of movement.  Denies deformity or swelling.  Denies pain.  Denies arthralgias or myalgias. Genitourinary: Denies pelvic pain.  Denies urinary frequency or hesitancy.  Denies dysuria.  Skin: Denies rash.  Denies petechiae, purpura, ecchymosis. Neurological: Denies syncope. Denies seizure activity. Denies slurred speech, drooping face.  Denies visual change. Psychiatric: Denies depression, anxiety. Denies hallucinations.  Past Medical History:  Diagnosis Date   Anxiety 05/07/2021   Asthma    Diabetes mellitus without complication ([place])    Gout    Hyperlipidemia    Hypertension    Hypertensive urgency 06/24/2021   Obesity    Obesity 06/24/2021   Pulmonary edema 06/24/2021    Past Surgical History:  Procedure Laterality Date   BIOPSY  02/15/2021   Procedure: BIOPSY;  Surgeon: Ronnette Juniper, MD;  Location: [facility];  Service: Gastroenterology;;   COLONOSCOPY WITH PROPOFOL N/A 02/15/2021   Procedure: COLONOSCOPY WITH PROPOFOL;  Surgeon: Ronnette Juniper, MD;  Location: [facility];  Service: Gastroenterology;  Laterality: N/A;    ESOPHAGOGASTRODUODENOSCOPY (EGD) WITH PROPOFOL N/A 02/15/2021   Procedure: ESOPHAGOGASTRODUODENOSCOPY (EGD) WITH PROPOFOL;  Surgeon: Ronnette Juniper, MD;  Location: [facility];  Service: Gastroenterology;  Laterality: N/A;   GANGLION CYST EXCISION Right 02/26/2020   Procedure: EXCISION TOPHUS RIGHT FOOT;  Surgeon: Evelina Bucy, DPM;  Location: [facility];  Service: Podiatry;  Laterality: Right;   NO PAST SURGERIES      Social History  reports that he has been smoking cigarettes. He has been smoking an average of 1 pack per day. He has never used smokeless tobacco. He reports current alcohol use. He reports current drug use. Frequency: 1.00 time per week. Drug: Marijuana.  Allergies  Allergen Reactions   Lactose Intolerance (Gi)     Family History  Problem Relation Age of Onset   Asthma Mother    Cancer Mother    Diabetes Mother    Obesity Mother    Obesity Sister    Diabetes Brother    Obesity Brother    Cancer Maternal Grandmother    Diabetes Maternal Grandmother    Diabetes Other      Prior to Admission medications   Medication Sig Start Date End Date Taking? Authorizing Provider  acetaminophen (TYLENOL) 325 MG tablet Take 1 tablet (325 mg total) by mouth every 4 (four) hours as needed for mild pain. 06/25/21   Raulkar, Clide Deutscher, MD  clonazePAM (KLONOPIN) 0.5 MG tablet Take 1 tablet (0.5 mg total) by mouth 3 (three) times daily. 06/25/21   Raulkar, Clide Deutscher, MD  colchicine 0.6 MG tablet Take 0.6 mg by mouth daily. 06/21/21   [provider]  cyanocobalamin 1000 MCG tablet Take 1 tablet (1,000 mcg total) by mouth daily. 05/03/21   Love, Ivan Anchors, PA-C  dapsone 100 MG tablet Take 1 tablet (100 mg total) by mouth daily. Restart on 04/03/2021 05/07/21   Truman Hayward, MD  diclofenac Sodium (VOLTAREN) 1 % GEL Apply 2 g topically 4 (four) times daily. 05/03/21   Love, Ivan Anchors, PA-C  Dolutegravir-lamiVUDine (DOVATO) 50-300 MG TABS Take 1 tablet by mouth  daily. 05/07/21 07/10/21  Truman Hayward, MD  famotidine (PEPCID) 40 MG tablet Take 1 tablet (40 mg total) by mouth daily. 06/02/21   Raulkar, Clide Deutscher, MD  ferrous sulfate 325 (65 FE) MG EC tablet Take 1 tablet by mouth daily. 06/03/21   [provider]  furosemide (LASIX) 40 MG tablet Take 1 tablet (40 mg total) by mouth every Monday, Wednesday, and Friday. 05/03/21   Love, Ivan Anchors, PA-C  gabapentin (NEURONTIN) 300 MG capsule Take 2 capsules (600 mg total) by mouth 3 (three) times daily. 05/03/21   Love, Ivan Anchors, PA-C  hydrALAZINE (APRESOLINE) 25 MG tablet Take 1 tablet (25 mg total) by mouth every 8 (eight) hours. Patient not taking: No sig reported 05/03/21   Love, Ivan Anchors, PA-C  insulin glargine (LANTUS SOLOSTAR) 100 UNIT/ML Solostar Pen Inject 5 Units into the skin daily. 05/03/21   Love, Ivan Anchors, PA-C  Insulin Pen Needle 32G X 4 MM MISC use as directed 05/03/21   Love, Ivan Anchors, PA-C  lidocaine (LIDODERM) 5 % Place 2 patches onto the skin daily. Remove & Discard patch within 12 hours or as directed by MD Patient not taking: Reported  on 06/24/2021 05/03/21   Bary Leriche, PA-C  lisinopril (ZESTRIL) 20 MG tablet Take 20 mg by mouth daily. 05/25/21   [provider]  magnesium oxide (MAG-OX) 400 MG tablet Take 1 tablet (400 mg total) by mouth 2 (two) times daily. 05/03/21   Love, Ivan Anchors, PA-C  MAGNESIUM-OXIDE 400 (240 Mg) MG tablet Take 1 tablet by mouth 2 (two) times daily. 06/03/21   [provider]  meloxicam (MOBIC) 15 MG tablet Take 15 mg by mouth daily. 06/01/21   [provider]  methocarbamol (ROBAXIN) 500 MG tablet Take 1 tablet (500 mg total) by mouth every 6 (six) hours as needed for muscle spasms. 05/03/21   Love, Ivan Anchors, PA-C  metoprolol tartrate (LOPRESSOR) 50 MG tablet Take 1 tablet (50 mg total) by mouth 2 (two) times daily. 05/03/21   Love, Ivan Anchors, PA-C  morphine (MS CONTIN) 15 MG 12 hr tablet Take 1 tablet (15 mg total) by mouth every 12  (twelve) hours. Patient not taking: No sig reported 05/03/21   Love, Ivan Anchors, PA-C  olmesartan (BENICAR) 20 MG tablet Take by mouth. 06/03/21   [provider]  omeprazole (PRILOSEC) 20 MG capsule Take 20 mg by mouth daily. 06/01/21   [provider]  Oxycodone HCl 10 MG TABS Take 1 tablet (10 mg total) by mouth every 4 (four) hours as needed. MAX 5 TABS PER DAY AS NEEDED FOR SEVERE PAIN Patient not taking: Reported on 06/24/2021 05/03/21   Love, Ivan Anchors, PA-C  polyethylene glycol (MIRALAX / GLYCOLAX) 17 g packet Take 17 g by mouth daily as needed for moderate constipation. Patient not taking: Reported on 06/24/2021 03/30/21   Mariel Aloe, MD  predniSONE (DELTASONE) 10 MG tablet Take 3 tablets (30 mg total) by mouth daily with breakfast. 05/04/21   Love, Ivan Anchors, PA-C  tamsulosin (FLOMAX) 0.4 MG CAPS capsule Take 1 capsule (0.4 mg total) by mouth daily after supper. 05/03/21   Love, Ivan Anchors, PA-C  traZODone (DESYREL) 50 MG tablet Take 50 mg by mouth at bedtime.    [provider]    Physical Exam: Vitals:   06/29/21 2040 06/29/21 2043 06/29/21 2100 06/29/21 2117  BP: (!) 216/122  (!) 180/105 (!) 195/113  Pulse: (!) 110 84 91   Resp: 18 18 18    Temp:      TempSrc:      SpO2: (!) 86% 96% 96%   Weight:      Height:        Constitutional: NAD, calm, comfortable Vitals:   06/29/21 2040 06/29/21 2043 06/29/21 2100 06/29/21 2117  BP: (!) 216/122  (!) 180/105 (!) 195/113  Pulse: (!) 110 84 91   Resp: 18 18 18    Temp:      TempSrc:      SpO2: (!) 86% 96% 96%   Weight:      Height:       General: WDWN, Alert and oriented x3.  Eyes: EOMI, PERRL, conjunctivae normal.  Sclera nonicteric HENT:  Bruce Hunter, external ears normal.  Nares patent without epistasis.  Mucous membranes are moist. Posterior pharynx clear Neck: Soft, normal range of motion, supple, no masses, no thyromegaly.  Trachea midline Respiratory: Equal breath sounds with diffuse expiratory wheezing  and rales, no crackles. Normal respiratory effort. No accessory muscle use.  Cardiovascular: Regular rate and rhythm, no murmurs / rubs / gallops. +3 lower extremity edema. Abdomen: Soft, no tenderness, nondistended, no rebound or guarding.  No masses palpated.  Bowel sounds normoactive Musculoskeletal: FROM. no cyanosis. No joint deformity upper and lower extremities. Normal muscle tone.  Skin: Warm, dry, intact no rashes, lesions, ulcers. No induration Neurologic: CN 2-12 grossly intact. Normal speech. Strength 5/5 in all extremities.   Psychiatric: Normal judgment and insight.  Normal mood.    Labs on Admission: I have personally reviewed following labs and imaging studies  CBC: Recent Labs  Lab 06/24/21 1217 06/29/21 1154 06/29/21 1912  WBC 9.9 10.4  --   NEUTROABS 8,623*  --   --   HGB 10.9* 11.6* 12.9*  HCT 35.0* 39.5 38.0*  MCV 95.1 100.8*  --   PLT 210 253  --     Basic Metabolic Panel: Recent Labs  Lab 06/24/21 1211 06/29/21 1154 06/29/21 1912  NA 140 136 139  K 5.3* 4.6 5.5*  CL 104 97*  --   CO2 27 25  --   GLUCOSE 299* 341*  --   BUN 32* 32*  --   CREATININE 1.87* 1.85*  --   CALCIUM 9.1 8.7*  --     GFR: Estimated Creatinine Clearance: 68.9 mL/min (A) (by C-G formula based on SCr of 1.85 mg/dL (H)).  Liver Function Tests: Recent Labs  Lab 06/24/21 1211 06/29/21 1154  AST 137* 143*  ALT 87* 111*  ALKPHOS 79 81  BILITOT 0.3 0.5  PROT 6.5 6.8  ALBUMIN 3.5 3.6    Urine analysis:    Component Value Date/Time   COLORURINE STRAW (A) 04/18/2021 2328   APPEARANCEUR CLEAR 04/18/2021 2328   LABSPEC 1.012 04/18/2021 2328   PHURINE 5.0 04/18/2021 2328   GLUCOSEU NEGATIVE 04/18/2021 2328   HGBUR NEGATIVE 04/18/2021 2328   BILIRUBINUR NEGATIVE 04/18/2021 2328   KETONESUR NEGATIVE 04/18/2021 2328   PROTEINUR NEGATIVE 04/18/2021 2328   NITRITE NEGATIVE 04/18/2021 2328   LEUKOCYTESUR NEGATIVE 04/18/2021 2328    Radiological Exams on Admission: DG  Chest 2 View  Result Date: 06/29/2021 CLINICAL DATA:  Shortness of breath. EXAM: CHEST - 2 VIEW COMPARISON:  None. FINDINGS: Stable cardiomegaly. Minimal bibasilar subsegmental atelectasis is noted. The visualized skeletal structures are unremarkable. IMPRESSION: Minimal bibasilar subsegmental atelectasis. Electronically Signed   By: Marijo Conception M.D.   On: 06/29/2021 12:48    EKG: Independently reviewed.  EKG shows sinus tachycardia with a heart rate of 100-1 05.  No acute ST elevation or depression.  QTc 453  Assessment/Plan Principal Problem:   COPD exacerbation  Bruce Hunter is placed on Med telemetry floor for observation.  Given solumedrol in the ER and multiple duoneb treatments and still has audible wheezing. No hypoxemia.  Prednisone daily for 3 days.  Doxycycline with COPD exacerbation requiring hospitalization Incentive spirometer every 2 hours while awake Breo Ellipta once a day  Active Problems:   Hypertensive urgency Patient given oral and IV hydralazine in the emergency room.  We will continue to monitor and if blood pressure remains elevated above 324 systolic we will give Lopressor 5 mg IV.  Continue ARB therapy with Avapro which is substituted for Benicar.  Continue Lopressor and p.o. hydralazine.  Monitor blood pressure    CKD (chronic kidney disease) stage 3, GFR 30-59 ml/min  Stable    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  Continue basal insulin.  Monitor blood sugars with meals and at bedtime.  Corrective insulin provide as needed for glycemic control.  Check hemoglobin A1c    AIDS (acquired immune deficiency syndrome) Followed by ID, continue current  medications   DVT prophylaxis: Lovenox for DVT prophylaxis.   Code Status:   Full Code  Family Communication:  Diagnosis and plan discussed with patient.  Questions answered.  Patient agrees with plan.  Further recommendations to follow as clinical indicated Disposition Plan:   Patient  is from:  Home  Anticipated DC to:  Home  Anticipated DC date:  Anticipate less than 2 midnight stay in the hospital  Admission status:  Observation   Yevonne Aline Antwoin Lackey MD [facility]  How to contact the [facility] Attending or Consulting provider [place] or covering provider during after hours [place], for this patient?   Check the care team in [facility] and look for a) attending/consulting [facility] provider listed and b) the [facility] team listed Log into [email or web address] and use [place]'s universal password to access. If you do not have the password, please contact the hospital operator. Locate the [facility] provider you are looking for under [facility] and page to a number that you can be directly reached. If you still have difficulty reaching the provider, please page the [facility] (Director on Call) for the Hospitalists listed on amion for assistance.  06/29/2021, 9:49 PM

## 2021-06-30 ENCOUNTER — Other Ambulatory Visit: Payer: Self-pay

## 2021-06-30 ENCOUNTER — Ambulatory Visit: Payer: Medicaid Other | Admitting: Physical Therapy

## 2021-06-30 DIAGNOSIS — F419 Anxiety disorder, unspecified: Secondary | ICD-10-CM

## 2021-06-30 DIAGNOSIS — J9811 Atelectasis: Secondary | ICD-10-CM | POA: Diagnosis present

## 2021-06-30 DIAGNOSIS — I160 Hypertensive urgency: Secondary | ICD-10-CM

## 2021-06-30 DIAGNOSIS — J4541 Moderate persistent asthma with (acute) exacerbation: Secondary | ICD-10-CM

## 2021-06-30 DIAGNOSIS — I509 Heart failure, unspecified: Secondary | ICD-10-CM

## 2021-06-30 DIAGNOSIS — Z794 Long term (current) use of insulin: Secondary | ICD-10-CM | POA: Diagnosis not present

## 2021-06-30 DIAGNOSIS — N1832 Chronic kidney disease, stage 3b: Secondary | ICD-10-CM | POA: Diagnosis present

## 2021-06-30 DIAGNOSIS — Z6839 Body mass index (BMI) 39.0-39.9, adult: Secondary | ICD-10-CM

## 2021-06-30 DIAGNOSIS — I700 Atherosclerosis of aorta: Secondary | ICD-10-CM | POA: Diagnosis present

## 2021-06-30 DIAGNOSIS — J441 Chronic obstructive pulmonary disease with (acute) exacerbation: Secondary | ICD-10-CM | POA: Diagnosis present

## 2021-06-30 DIAGNOSIS — Z6841 Body Mass Index (BMI) 40.0 and over, adult: Secondary | ICD-10-CM | POA: Diagnosis not present

## 2021-06-30 DIAGNOSIS — F17210 Nicotine dependence, cigarettes, uncomplicated: Secondary | ICD-10-CM | POA: Diagnosis present

## 2021-06-30 DIAGNOSIS — R635 Abnormal weight gain: Secondary | ICD-10-CM

## 2021-06-30 DIAGNOSIS — Z791 Long term (current) use of non-steroidal anti-inflammatories (NSAID): Secondary | ICD-10-CM | POA: Diagnosis not present

## 2021-06-30 DIAGNOSIS — Z833 Family history of diabetes mellitus: Secondary | ICD-10-CM | POA: Diagnosis not present

## 2021-06-30 DIAGNOSIS — I5043 Acute on chronic combined systolic (congestive) and diastolic (congestive) heart failure: Secondary | ICD-10-CM | POA: Diagnosis present

## 2021-06-30 DIAGNOSIS — R0602 Shortness of breath: Secondary | ICD-10-CM | POA: Diagnosis present

## 2021-06-30 DIAGNOSIS — E1142 Type 2 diabetes mellitus with diabetic polyneuropathy: Secondary | ICD-10-CM

## 2021-06-30 DIAGNOSIS — Z825 Family history of asthma and other chronic lower respiratory diseases: Secondary | ICD-10-CM | POA: Diagnosis not present

## 2021-06-30 DIAGNOSIS — E1122 Type 2 diabetes mellitus with diabetic chronic kidney disease: Secondary | ICD-10-CM | POA: Diagnosis present

## 2021-06-30 DIAGNOSIS — E1165 Type 2 diabetes mellitus with hyperglycemia: Secondary | ICD-10-CM | POA: Diagnosis not present

## 2021-06-30 DIAGNOSIS — B20 Human immunodeficiency virus [HIV] disease: Secondary | ICD-10-CM | POA: Diagnosis present

## 2021-06-30 DIAGNOSIS — E1140 Type 2 diabetes mellitus with diabetic neuropathy, unspecified: Secondary | ICD-10-CM | POA: Diagnosis present

## 2021-06-30 DIAGNOSIS — E785 Hyperlipidemia, unspecified: Secondary | ICD-10-CM | POA: Diagnosis present

## 2021-06-30 DIAGNOSIS — I428 Other cardiomyopathies: Secondary | ICD-10-CM | POA: Diagnosis present

## 2021-06-30 DIAGNOSIS — N179 Acute kidney failure, unspecified: Secondary | ICD-10-CM | POA: Diagnosis not present

## 2021-06-30 DIAGNOSIS — I130 Hypertensive heart and chronic kidney disease with heart failure and stage 1 through stage 4 chronic kidney disease, or unspecified chronic kidney disease: Secondary | ICD-10-CM | POA: Diagnosis present

## 2021-06-30 DIAGNOSIS — Z20822 Contact with and (suspected) exposure to covid-19: Secondary | ICD-10-CM | POA: Diagnosis present

## 2021-06-30 DIAGNOSIS — I5031 Acute diastolic (congestive) heart failure: Secondary | ICD-10-CM | POA: Diagnosis not present

## 2021-06-30 DIAGNOSIS — Z809 Family history of malignant neoplasm, unspecified: Secondary | ICD-10-CM | POA: Diagnosis not present

## 2021-06-30 LAB — BASIC METABOLIC PANEL
Anion gap: 9 (ref 5–15)
BUN: 36 mg/dL — ABNORMAL HIGH (ref 6–20)
CO2: 26 mmol/L (ref 22–32)
Calcium: 8.8 mg/dL — ABNORMAL LOW (ref 8.9–10.3)
Chloride: 100 mmol/L (ref 98–111)
Creatinine, Ser: 1.99 mg/dL — ABNORMAL HIGH (ref 0.61–1.24)
GFR, Estimated: 41 mL/min — ABNORMAL LOW (ref >60)
Glucose, Bld: 293 mg/dL — ABNORMAL HIGH (ref 70–99)
Potassium: 4.7 mmol/L (ref 3.5–5.1)
Sodium: 135 mmol/L (ref 135–145)

## 2021-06-30 LAB — CBC
HCT: 38.2 % — ABNORMAL LOW (ref 39.0–52.0)
Hemoglobin: 11.7 g/dL — ABNORMAL LOW (ref 13.0–17.0)
MCH: 29.9 pg (ref 26.0–34.0)
MCHC: 30.6 g/dL (ref 30.0–36.0)
MCV: 97.7 fL (ref 80.0–100.0)
Platelets: 242 10*3/uL (ref 150–400)
RBC: 3.91 MIL/uL — ABNORMAL LOW (ref 4.22–5.81)
RDW: 18.2 % — ABNORMAL HIGH (ref 11.5–15.5)
WBC: 12.1 10*3/uL — ABNORMAL HIGH (ref 4.0–10.5)
nRBC: 0 % (ref 0.0–0.2)

## 2021-06-30 LAB — GLUCOSE, CAPILLARY
Glucose-Capillary: 184 mg/dL — ABNORMAL HIGH (ref 70–99)
Glucose-Capillary: 255 mg/dL — ABNORMAL HIGH (ref 70–99)
Glucose-Capillary: 192 mg/dL — ABNORMAL HIGH (ref 70–99)

## 2021-06-30 LAB — CBG MONITORING, ED: Glucose-Capillary: 260 mg/dL — ABNORMAL HIGH (ref 70–99)

## 2021-06-30 LAB — HEMOGLOBIN A1C
Hgb A1c MFr Bld: 6.6 % — ABNORMAL HIGH (ref 4.8–5.6)
Mean Plasma Glucose: 142.72 mg/dL

## 2021-06-30 MED ORDER — FUROSEMIDE 10 MG/ML IJ SOLN
40.0000 mg | Freq: Two times a day (BID) | INTRAMUSCULAR | Status: DC
  Administered 2021-06-30 – 2021-07-01 (×2): 40 mg via INTRAVENOUS
  Filled 2021-06-30 (×2): qty 4

## 2021-06-30 MED ORDER — ATORVASTATIN CALCIUM 10 MG PO TABS
10.0000 mg | ORAL_TABLET | Freq: Every day | ORAL | Status: DC
  Administered 2021-07-01 – 2021-07-04 (×4): 10 mg via ORAL
  Filled 2021-06-30 (×4): qty 1

## 2021-06-30 MED ORDER — LAMIVUDINE 150 MG PO TABS
300.0000 mg | ORAL_TABLET | Freq: Every day | ORAL | Status: DC
  Administered 2021-06-30 – 2021-07-04 (×5): 300 mg via ORAL
  Filled 2021-06-30 (×6): qty 2

## 2021-06-30 MED ORDER — DOLUTEGRAVIR SODIUM 50 MG PO TABS
50.0000 mg | ORAL_TABLET | Freq: Every day | ORAL | Status: DC
  Administered 2021-06-30 – 2021-07-04 (×5): 50 mg via ORAL
  Filled 2021-06-30 (×6): qty 1

## 2021-06-30 MED ORDER — IPRATROPIUM-ALBUTEROL 0.5-2.5 (3) MG/3ML IN SOLN
3.0000 mL | Freq: Four times a day (QID) | RESPIRATORY_TRACT | Status: DC
  Administered 2021-06-30 (×3): 3 mL via RESPIRATORY_TRACT
  Filled 2021-06-30 (×2): qty 3

## 2021-06-30 MED ORDER — OXYCODONE HCL 5 MG PO TABS
10.0000 mg | ORAL_TABLET | Freq: Four times a day (QID) | ORAL | Status: AC | PRN
  Administered 2021-06-30 (×3): 10 mg via ORAL
  Filled 2021-06-30 (×3): qty 2

## 2021-06-30 MED ORDER — OXYCODONE HCL 5 MG PO TABS
10.0000 mg | ORAL_TABLET | Freq: Four times a day (QID) | ORAL | Status: DC | PRN
  Administered 2021-07-01 – 2021-07-04 (×11): 10 mg via ORAL
  Filled 2021-06-30 (×11): qty 2

## 2021-06-30 MED ORDER — ISOSORB DINITRATE-HYDRALAZINE 20-37.5 MG PO TABS
1.0000 | ORAL_TABLET | Freq: Three times a day (TID) | ORAL | Status: DC
  Administered 2021-06-30 – 2021-07-01 (×3): 1 via ORAL
  Filled 2021-06-30 (×5): qty 1

## 2021-06-30 MED ORDER — FUROSEMIDE 40 MG PO TABS
40.0000 mg | ORAL_TABLET | Freq: Every day | ORAL | Status: DC
  Administered 2021-06-30: 40 mg via ORAL
  Filled 2021-06-30: qty 2

## 2021-06-30 NOTE — Progress Notes (Signed)
Progress Note  Patient Name: Bruce Hunter Date of Encounter: 07/01/2021  Attending physician: Oswald Hillock, MD Primary care provider: Elwyn Reach, MD Primary Cardiologist: Dr. Adrian Prows Consultant:Allyana Vogan Terri Skains, DO  Subjective: Bruce Hunter is a 45 y.o. male who was seen and examined at bedside Shortness of breath improving. Lower extremity swelling improving. Denies any chest pain. I's and O's have not been correctly documented.  Objective: Vital Signs in the last 24 hours: Temp:  [98.2 F (36.8 C)-98.8 F (37.1 C)] 98.8 F (37.1 C) (10/27 1207) Pulse Rate:  [71-85] 71 (10/27 1212) Resp:  [17-20] 17 (10/27 1207) BP: (155-194)/(98-116) 188/116 (10/27 1212) SpO2:  [93 %-95 %] 94 % (10/27 1212)  Intake/Output:  Intake/Output Summary (Last 24 hours) at 07/01/2021 1524 Last data filed at 07/01/2021 0113 Gross per 24 hour  Intake 360 ml  Output --  Net 360 ml    Net IO Since Admission: 360 mL [07/01/21 1524]  Telemetry: Personally reviewed. NSR  Physical examination: PHYSICAL EXAM: Vitals with BMI 07/01/2021 07/01/2021 07/01/2021  Height - - -  Weight - - -  BMI - - -  Systolic 161 096 -  Diastolic 045 409 -  Pulse 71 80 84    CONSTITUTIONAL: Appears older than stated age.  Well-developed and well-nourished. No acute distress.  SKIN: Skin is warm and dry. No rash noted. No cyanosis. No pallor. No jaundice HEAD: Normocephalic and atraumatic.  EYES: No scleral icterus MOUTH/THROAT: Moist oral membranes.  NECK: Short neck stature making JVP evaluation difficult. No thyromegaly noted. No carotid bruits  LYMPHATIC: No visible cervical adenopathy.  CHEST Normal respiratory effort. No intercostal retractions  LUNGS: Minimal expiratory wheezes noted bilaterally.  No stridor.  No rales.  CARDIOVASCULAR: Regular, positive S1-S2, no murmurs rubs or gallops appreciated. ABDOMINAL: Obese, soft, nontender, nondistended, positive bowel sounds all 4 quadrants. No  apparent ascites.  EXTREMITIES: bilateral 2+ peripheral edema, warm to touch. HEMATOLOGIC: No significant bruising NEUROLOGIC: Oriented to person, place, and time. Nonfocal. Normal muscle tone.  PSYCHIATRIC: Normal mood and affect. Normal behavior. Cooperative  Lab Results: Hematology Recent Labs  Lab 06/29/21 1154 06/29/21 1912 06/30/21 0450  WBC 10.4  --  12.1*  RBC 3.92*  --  3.91*  HGB 11.6* 12.9* 11.7*  HCT 39.5 38.0* 38.2*  MCV 100.8*  --  97.7  MCH 29.6  --  29.9  MCHC 29.4*  --  30.6  RDW 18.2*  --  18.2*  PLT 253  --  242    Chemistry Recent Labs  Lab 06/29/21 1154 06/29/21 1912 06/30/21 0450 07/01/21 0236  NA 136 139 135 136  K 4.6 5.5* 4.7 4.3  CL 97*  --  100 98  CO2 25  --  26 28  GLUCOSE 341*  --  293* 222*  BUN 32*  --  36* 43*  CREATININE 1.85*  --  1.99* 2.28*  CALCIUM 8.7*  --  8.8* 9.1  PROT 6.8  --   --   --   ALBUMIN 3.6  --   --   --   AST 143*  --   --   --   ALT 111*  --   --   --   ALKPHOS 81  --   --   --   BILITOT 0.5  --   --   --   GFRNONAA 45*  --  41* 35*  ANIONGAP 14  --  9 10     Cardiac Enzymes:  Cardiac Panel (last 3 results) No results for input(s): CKTOTAL, CKMB, TROPONINIHS, RELINDX in the last 72 hours.  BNP (last 3 results) Recent Labs    03/04/21 1814 06/29/21 1850  BNP 837.4* 151.4*    ProBNP (last 3 results) Recent Labs    06/07/21 1627 06/24/21 1217  PROBNP 1,038* 1,121*     DDimer No results for input(s): DDIMER in the last 168 hours.   Hemoglobin A1c:  Lab Results  Component Value Date   HGBA1C 6.6 (H) 06/30/2021   MPG 142.72 06/30/2021    TSH  Recent Labs    02/12/21 0322  TSH 0.430    Lipid Panel     Component Value Date/Time   CHOL 219 (H) 07/01/2021 0236   TRIG 113 07/01/2021 0236   HDL 67 07/01/2021 0236   CHOLHDL 3.3 07/01/2021 0236   VLDL 23 07/01/2021 0236   LDLCALC 129 (H) 07/01/2021 0236    Imaging: No results found.  Cardiac database: EKG: EKG 06/29/2021: Sinus  tachycardia at rate of 101 bpm, rightward axis, incomplete right bundle branch block.  Poor R wave progression, cannot exclude anteroseptal infarct old.  No evidence of ischemia.  No significant change from 03/19/2021.  Echocardiogram: 03/05/2021:    1. Left ventricular ejection fraction, by estimation, is 60 to 65%. The left ventricle has normal function. The left ventricle has no regional wall motion abnormalities. Left ventricular diastolic parameters were normal.  2. Right ventricular systolic function is low normal. The right ventricular size is normal. There is mildly elevated pulmonary artery systolic pressure.  3. Right atrial size was mildly dilated.  4. The mitral valve is normal in structure. No evidence of mitral valve regurgitation.  5. Tricuspid valve regurgitation is mild to moderate.  6. The aortic valve is normal in structure. Aortic valve regurgitation is not visualized.  CT angiogram chest 04/28/2021: 1. No CT evidence of pulmonary embolism. 2. Streaky area of airspace density extending from the right hilum into the right upper lobe may represent atelectasis. Atypical infection is not excluded. Clinical correlation and follow-up recommended. 3. Colonic diverticulosis. Mild pericolonic haziness adjacent to the ascending colon may be chronic or represent mild acute diverticulitis. Clinical correlation is recommended. No diverticular abscess or perforation. 4. Cholelithiasis. 5. Cardiovascular: Mild cardiomegaly. No pericardial effusion. Thoracic aorta is unremarkable.  Aortic Atherosclerosis-mild aortoiliac atherosclerotic plaque.   Scheduled Meds:  aspirin EC  81 mg Oral Daily   atorvastatin  10 mg Oral Daily   dolutegravir  50 mg Oral Daily   And   lamiVUDine  300 mg Oral Daily   doxycycline  100 mg Oral Q12H   enoxaparin (LOVENOX) injection  0.5 mg/kg Subcutaneous Q24H   famotidine  40 mg Oral Daily   fluticasone furoate-vilanterol  1 puff Inhalation Daily    furosemide  40 mg Intravenous Daily   gabapentin  600 mg Oral TID   insulin aspart  0-15 Units Subcutaneous TID WC   insulin aspart  0-5 Units Subcutaneous QHS   insulin glargine-yfgn  5 Units Subcutaneous Daily   ipratropium-albuterol  3 mL Nebulization BID   isosorbide-hydrALAZINE  2 tablet Oral TID   magnesium oxide  400 mg Oral BID   metoprolol tartrate  50 mg Oral BID   [START ON 07/02/2021] predniSONE  20 mg Oral Q breakfast   traZODone  50 mg Oral QHS    Continuous Infusions:   PRN Meds: acetaminophen **OR** acetaminophen, clonazePAM, methocarbamol, metoprolol tartrate, ondansetron **OR** ondansetron (ZOFRAN) IV, oxyCODONE, senna-docusate  IMPRESSION & RECOMMENDATIONS: Bruce Hunter is a 45 y.o. male whose past medical history and cardiac risk factors include: HFpEF, HIV/AIDS, hypertension, COPD, DMT2 with neuropathy, obesity, CKD 3, gout, CHF, tobacco use   A/C HFpEF, stage C, NYHA class II/III: Volume overload most likely secondary to him acute exacerbation of HFpEF, chronic steroid use, and medication/dietary noncompliance. Strict I's and O's and daily weights - not done, orders placed.  Due to worsening renal function will decrease Lasix to once a day. Currently on Avapro 150 mg p.o. daily.  Will discontinue for now due to renal function Compression stockings thigh-high. Increase Bidil to 20/37.5mg  2 tabs bid  Limited echocardiogram. Continue BiDil, metoprolol. Will hold off on starting Farxiga.   Acute kidney injury on chronic kidney disease: Most likely secondary to diuresis. As mentioned above we will decrease Lasix to once a day, hold Avapro. Monitor BUN and creatinine Strict I's and O's and daily weights.  Diabetes mellitus with stage III chronic kidney disease and hyperglycemia:  Currently managed per primary team  Hypertension with chronic kidney disease stage III: Not well controlled. Increase BiDil as discussed above. Follow blood  pressures.  HIV/AIDS: Currently being followed by infectious disease.  On antiretroviral medications.  Aortic atherosclerosis:  Start aspirin 81 mg p.o. daily Continue atorvastatin 10 mg p.o. nightly  Smoking cigarettes: Educated on the importance of complete smoking cessation.  Patient's questions and concerns were addressed to his satisfaction. He voices understanding of the instructions provided during this encounter.   This note was created using a voice recognition software as a result there may be grammatical errors inadvertently enclosed that do not reflect the nature of this encounter. Every attempt is made to correct such errors.  Rex Kras, DO, [facility]. [facility] Office: [phone] 07/01/2021, 3:24 PM

## 2021-06-30 NOTE — Progress Notes (Signed)
Inpatient Diabetes Program Recommendations  AACE/ADA: New Consensus Statement on Inpatient Glycemic Control (2015)  Target Ranges:  Prepandial:   less than 140 mg/dL      Peak postprandial:   less than 180 mg/dL (1-2 hours)      Critically ill patients:  140 - 180 mg/dL   Lab Results  Component Value Date   GLUCAP 260 (H) 06/30/2021   HGBA1C 6.6 (H) 06/30/2021    Review of Glycemic Control Results for EUNICE, OLDAKER (MRN [id number]) as of 06/30/2021 10:41  Ref. Range 05/03/2021 12:11 06/29/2021 19:08 06/29/2021 21:28 06/29/2021 23:47 06/30/2021 08:26  Glucose-Capillary Latest Ref Range: 70 - 99 mg/dL 167 (H) 208 (H) 186 (H) 179 (H) 260 (H)   Diabetes history: DM 2 Outpatient Diabetes medications:  Lantus 5 units daily Current orders for Inpatient glycemic control:  Semglee 5 units daily Novolog moderate tid with meals and HS Prednisone 60 mg daily  Inpatient Diabetes Program Recommendations:    May consider adding Novolog 3 units tid with meals (hold if patient eats less than 50% or NPO).   Thanks,  Adah Perl, RN, BC-ADM Inpatient Diabetes Coordinator Pager [phone]  (8a-5p)

## 2021-06-30 NOTE — Consult Note (Signed)
CARDIOLOGY CONSULT NOTE  Patient ID: Bruce Hunter MRN: [id number] DOB/AGE: [birth year] 45 y.o.  Admit date: 06/29/2021 Referring Physician  Eleonore Chiquito, MD Primary Physician:  Elwyn Reach, MD Reason for Consultation  Dyspnea on exertion and evaluation CHF  Patient ID: Bruce Hunter, male    DOB: 02/13/1976, 45 y.o.   MRN: [id number]  Chief Complaint  Patient presents with   Shortness of Breath   HPI:    Bruce Hunter  is a 45 y.o. male with medical history significant for HIV/AIDS, hypertension, COPD, DMT2 with neuropathy, obesity, CKD 3, gout, CHF, tobacco use who presents with complaint of SOB and wheezing.  Discharged on 05/03/2021 after being in the rehab after he presented with acute respiratory distress and pneumonia and sepsis  Patient states that since hospital discharge he has still been having shortness of breath and has not been able to walk much at home.  Yesterday he felt more short of breath and was wheezing which was fairly loud and wanted to come to the hospital. In the emergency room patient was found to be hypertensive, also in acute respiratory distress needing nebulizer treatment and then eventually admitted to the hospital for further evaluation.  Patient has chronic leg edema and states that edema has been worsening over the past few days.  He has orthopnea that is chronic but denies any chest pain associated with his dyspnea, his activity is markedly limited,  Past Medical History:  Diagnosis Date   Anxiety 05/07/2021   Asthma    Diabetes mellitus without complication ([place])    Gout    Hyperlipidemia    Hypertension    Hypertensive urgency 06/24/2021   Obesity    Obesity 06/24/2021   Pulmonary edema 06/24/2021   Past Surgical History:  Procedure Laterality Date   BIOPSY  02/15/2021   Procedure: BIOPSY;  Surgeon: Ronnette Juniper, MD;  Location: [facility];  Service: Gastroenterology;;   COLONOSCOPY WITH PROPOFOL N/A 02/15/2021   Procedure: COLONOSCOPY WITH  PROPOFOL;  Surgeon: Ronnette Juniper, MD;  Location: [facility];  Service: Gastroenterology;  Laterality: N/A;   ESOPHAGOGASTRODUODENOSCOPY (EGD) WITH PROPOFOL N/A 02/15/2021   Procedure: ESOPHAGOGASTRODUODENOSCOPY (EGD) WITH PROPOFOL;  Surgeon: Ronnette Juniper, MD;  Location: [facility];  Service: Gastroenterology;  Laterality: N/A;   GANGLION CYST EXCISION Right 02/26/2020   Procedure: EXCISION TOPHUS RIGHT FOOT;  Surgeon: Evelina Bucy, DPM;  Location: [facility];  Service: Podiatry;  Laterality: Right;   NO PAST SURGERIES     Social History   Tobacco Use   Smoking status: Every Day    Packs/day: 1.00    Types: Cigarettes   Smokeless tobacco: Never  Substance Use Topics   Alcohol use: Yes    Family History  Problem Relation Age of Onset   Asthma Mother    Cancer Mother    Diabetes Mother    Obesity Mother    Obesity Sister    Diabetes Brother    Obesity Brother    Cancer Maternal Grandmother    Diabetes Maternal Grandmother    Diabetes Other     Marital Status: Single  ROS  Review of Systems  Constitutional: Positive for weight gain.  Cardiovascular:  Positive for leg swelling. Negative for chest pain.  Respiratory:  Positive for wheezing.   Musculoskeletal:  Positive for arthritis and joint pain.  Gastrointestinal:  Negative for melena.  Objective   Vitals with BMI 06/30/2021 06/30/2021 06/30/2021  Height - - -  Weight - - -  BMI - - -  Systolic 350 093 818  Diastolic 299 85 85  Pulse 70 74 79    Blood pressure (!) 193/117, pulse 70, temperature 98.3 F (36.8 C), temperature source Oral, resp. rate 20, height 5\' 10"  (1.778 m), weight 132 kg, SpO2 98 %.  Body mass index is 41.75 kg/m.    Physical Exam Constitutional:      Comments: Morbidly obese in no acute distress.  Neck:     Vascular: No carotid bruit.     Comments: Short neck and difficult to evaluate JVD Cardiovascular:     Rate and Rhythm: Normal rate and regular rhythm.     Pulses:           Carotid pulses are 2+ on the right side and 2+ on the left side.      Dorsalis pedis pulses are 2+ on the right side and 2+ on the left side.       Posterior tibial pulses are 2+ on the right side and 2+ on the left side.     Heart sounds: Normal heart sounds. No murmur heard.   No gallop.     Comments: Femoral and popliteal pulse difficult to feel due to patient's body habitus.  No leg edema. JVD difficult to see due to short neck. Pulmonary:     Effort: Pulmonary effort is normal.     Breath sounds: Wheezing (bilateral diffuse mild expiratory wheezing present) present.  Abdominal:     General: Bowel sounds are normal.     Palpations: Abdomen is soft.     Comments: Obese. Pannus present  Musculoskeletal:     Right lower leg: Edema (2 + pitting bilateral above knee edema) present.     Left lower leg: Edema (2 + pitting bilateral above knee edema) present.   Laboratory examination:   Recent Labs    06/24/21 1211 06/29/21 1154 06/29/21 1912 06/30/21 0450  NA 140 136 139 135  K 5.3* 4.6 5.5* 4.7  CL 104 97*  --  100  CO2 27 25  --  26  GLUCOSE 299* 341*  --  293*  BUN 32* 32*  --  36*  CREATININE 1.87* 1.85*  --  1.99*  CALCIUM 9.1 8.7*  --  8.8*  GFRNONAA 45* 45*  --  41*   estimated creatinine clearance is 64 mL/min (A) (by C-G formula based on SCr of 1.99 mg/dL (H)).  CMP Latest Ref Rng & Units 06/30/2021 06/29/2021 06/29/2021  Glucose 70 - 99 mg/dL 293(H) - 341(H)  BUN 6 - 20 mg/dL 36(H) - 32(H)  Creatinine 0.61 - 1.24 mg/dL 1.99(H) - 1.85(H)  Sodium 135 - 145 mmol/L 135 139 136  Potassium 3.5 - 5.1 mmol/L 4.7 5.5(H) 4.6  Chloride 98 - 111 mmol/L 100 - 97(L)  CO2 22 - 32 mmol/L 26 - 25  Calcium 8.9 - 10.3 mg/dL 8.8(L) - 8.7(L)  Total Protein 6.5 - 8.1 g/dL - - 6.8  Total Bilirubin 0.3 - 1.2 mg/dL - - 0.5  Alkaline Phos 38 - 126 U/L - - 81  AST 15 - 41 U/L - - 143(H)  ALT 0 - 44 U/L - - 111(H)   CBC Latest Ref Rng & Units 06/30/2021 06/29/2021 06/29/2021   WBC 4.0 - 10.5 K/uL 12.1(H) - 10.4  Hemoglobin 13.0 - 17.0 g/dL 11.7(L) 12.9(L) 11.6(L)  Hematocrit 39.0 - 52.0 % 38.2(L) 38.0(L) 39.5  Platelets 150 - 400 K/uL 242 - 253   Lipid Panel Recent Labs  03/11/21 0520 04/27/21 1226  CHOL  --  127  TRIG 149 40  LDLCALC  --  78  VLDL  --  8  HDL  --  41  CHOLHDL  --  3.1    HEMOGLOBIN A1C Lab Results  Component Value Date   HGBA1C 6.6 (H) 06/30/2021   MPG 142.72 06/30/2021   TSH Recent Labs    02/12/21 0322  TSH 0.430   BNP (last 3 results) Recent Labs    03/04/21 1814 06/29/21 1850  BNP 837.4* 151.4*    Cardiac Panel (last 3 results) No results for input(s): CKTOTAL, CKMB, TROPONINIHS, RELINDX in the last 72 hours.   Medications and allergies   Allergies  Allergen Reactions   Lactose Intolerance (Gi)      No outpatient medications have been marked as taking for the 06/29/21 encounter [facility] Encounter).    Scheduled Meds:  dolutegravir  50 mg Oral Daily   And   lamiVUDine  300 mg Oral Daily   doxycycline  100 mg Oral Q12H   enoxaparin (LOVENOX) injection  0.5 mg/kg Subcutaneous Q24H   famotidine  40 mg Oral Daily   fluticasone furoate-vilanterol  1 puff Inhalation Daily   furosemide  40 mg Oral Daily   gabapentin  600 mg Oral TID   hydrALAZINE  25 mg Oral Q8H   insulin aspart  0-15 Units Subcutaneous TID WC   insulin aspart  0-5 Units Subcutaneous QHS   insulin glargine-yfgn  5 Units Subcutaneous Daily   ipratropium-albuterol  3 mL Nebulization Q6H   irbesartan  150 mg Oral Daily   magnesium oxide  400 mg Oral BID   metoprolol tartrate  50 mg Oral BID   predniSONE  60 mg Oral Q breakfast   traZODone  50 mg Oral QHS   Continuous Infusions: PRN Meds:.acetaminophen **OR** acetaminophen, clonazePAM, metoprolol tartrate, ondansetron **OR** ondansetron (ZOFRAN) IV, oxyCODONE, senna-docusate   No intake/output data recorded. No intake/output data recorded.    Radiology:   DG Chest 2  View  Result Date: 06/29/2021 CLINICAL DATA:  Shortness of breath. EXAM: CHEST - 2 VIEW COMPARISON:  None. FINDINGS: Stable cardiomegaly. Minimal bibasilar subsegmental atelectasis is noted. The visualized skeletal structures are unremarkable. IMPRESSION: Minimal bibasilar subsegmental atelectasis. Electronically Signed   By: Marijo Conception M.D.   On: 06/29/2021 12:48    CT angiogram chest 04/28/2021: 1. No CT evidence of pulmonary embolism. 2. Streaky area of airspace density extending from the right hilum into the right upper lobe may represent atelectasis. Atypical infection is not excluded. Clinical correlation and follow-up recommended. 3. Colonic diverticulosis. Mild pericolonic haziness adjacent to the ascending colon may be chronic or represent mild acute diverticulitis. Clinical correlation is recommended. No diverticular abscess or perforation. 4. Cholelithiasis. 5. Cardiovascular: Mild cardiomegaly. No pericardial effusion. Thoracic aorta is unremarkable.  Aortic Atherosclerosis-mild aortoiliac atherosclerotic plaque.   Cardiac Studies:   Echocardiogram 03/05/2021:    1. Left ventricular ejection fraction, by estimation, is 60 to 65%. The left ventricle has normal function. The left ventricle has no regional wall motion abnormalities. Left ventricular diastolic parameters were normal.  2. Right ventricular systolic function is low normal. The right ventricular size is normal. There is mildly elevated pulmonary artery systolic pressure.  3. Right atrial size was mildly dilated.  4. The mitral valve is normal in structure. No evidence of mitral valve regurgitation.  5. Tricuspid valve regurgitation is mild to moderate.  6. The aortic valve is normal in structure. Aortic valve  regurgitation is not visualized.  EKG:  EKG 06/29/2021: Sinus tachycardia at rate of 101 bpm, rightward axis, incomplete right bundle branch block.  Poor R wave progression, cannot exclude anteroseptal  infarct old.  No evidence of ischemia.  No significant change from 03/19/2021.  Assessment   1.  Acute diastolic heart failure 2.  Diabetes mellitus uncontrolled with stage IIIa chronic kidney disease with hyperglycemia 3.  HIV/AIDS 4.  Hypertension 5.  Morbid obesity with BMI of 41 with underlying major medical comorbidity 6.  Aortic atherosclerosis on the CT scan.  Recommendations:   Patient needs optimization from medical therapy.  He is certainly at extreme high risk for underlying coronary artery disease in view of uncontrolled diabetes mellitus, chronic kidney disease, HIV and AIDS.  However I would like to manage him conservatively, his main concern presentation is most consistent with underlying COPD exacerbation with significant expiratory wheezing.    Would diurese him with IV Lasix, patient has been on oral Lasix at home.  But on questioning, patient is really not following any diet, essentially eats frozen meals if from breakfast to eating sandwiches with cheese in the afternoon and going to [employer] in the evening and also snacks continuously.  He will need nutritional consult, and management of diabetes mellitus.  I will place him on isosorbide dinitrate along with hydralazine for blood pressure management, heart failure management.  He is presently on low-dose of ARB, I would continue this as his renal function has essentially remained not significantly altered.  He will need ischemic evaluation however I prefer to see how he would show compliance with medical management first.  Fortunately CT scan of the chest does not reveal any significant coronary calcification. In spite of uncontrolled diabetes, morbid obesity, his lipids are completely normal.  However in view of aortic atherosclerosis, will add atorvastatin 10 mg daily.   Adrian Prows, MD, [facility] 06/30/2021, 10:27 AM Office: [phone]

## 2021-06-30 NOTE — Progress Notes (Signed)
New admission to room 2W15. Patient is A&O*4, able to verbalize his needs to staff. VS checked and tele monitor applied. CCMD notified. No skin issues noticed except for some bruises to BL arms. Bed kept in low position and locked. Call bell in reach.

## 2021-06-30 NOTE — ED Notes (Signed)
ED Provider at bedside. 

## 2021-06-30 NOTE — Progress Notes (Signed)
[facility]  PROGRESS NOTE  Bruce Hunter WNU:[id number] DOB: Nov 25, 1975 DOA: 06/29/2021 PCP: Elwyn Reach, MD   Brief HPI:   45 year old male with medical history of HIV/AIDS, hypertension, COPD, diabetes mellitus type 2 with neuropathy, obesity, CKD stage III, gout, CHF, tobacco use presents with complaints of shortness of breath and wheezing.  He was admitted few weeks ago with CHF exacerbation and respiratory failure that required intubation and mechanical ventilation and also tracheostomy.  At that time he was treated with PCP pneumonia and developed renal failure due to septic shock that has resolved.  He was started on prednisone at that time for gout and has been continued on that. In the ED patient received multiple DuoNeb treatments for COPD exacerbation chest x-ray showed bibasilar atelectasis but no acute infiltrate or consolidation    Subjective   Patient seen and examined, still has some wheezing.  Diuresed well with IV Lasix.  Has significant edema in the lower extremities.   Assessment/Plan:    COPD exacerbation -Patient admitted with COPD exacerbation -Received duo nebs and Solu-Medrol in the ED -Started on prednisone 60 mg daily -Continue doxycycline -Breo Ellipta once a day  Hypertensive urgency -Patient blood pressure was significantly elevated -Received oral and IV hydralazine in the ED -Continue Lopressor  Acute diastolic heart failure -Patient has significant volume overload -Received Lasix 60 mg IV yesterday -BUN/creatinine is elevated today to 36/1.99 -We will consult cardiology for further assistance with diuresis  CKD stage III -Creatinine stable  Diabetes mellitus type 2 -Continue Lantus 5 units subcu daily; sliding scale insulin with NovoLog -CBG well controlled -Hemoglobin A1c 6.6   HIV -Continue Dovato -Seen by ID today  History of gout flare -Patient has been on prednisone for more than 3 months -He has been taking  prednisone 30 mg daily at home -ID recommends to taper it slowly over the next 2 weeks -We will discharge on 20 mg daily for a week followed by 10 mg daily for a week and then stop   Scheduled medications:    [START ON 07/01/2021] atorvastatin  10 mg Oral Daily   dolutegravir  50 mg Oral Daily   And   lamiVUDine  300 mg Oral Daily   doxycycline  100 mg Oral Q12H   enoxaparin (LOVENOX) injection  0.5 mg/kg Subcutaneous Q24H   famotidine  40 mg Oral Daily   fluticasone furoate-vilanterol  1 puff Inhalation Daily   furosemide  40 mg Intravenous Q12H   gabapentin  600 mg Oral TID   insulin aspart  0-15 Units Subcutaneous TID WC   insulin aspart  0-5 Units Subcutaneous QHS   insulin glargine-yfgn  5 Units Subcutaneous Daily   ipratropium-albuterol  3 mL Nebulization Q6H   irbesartan  150 mg Oral Daily   isosorbide-hydrALAZINE  1 tablet Oral TID   magnesium oxide  400 mg Oral BID   metoprolol tartrate  50 mg Oral BID   predniSONE  60 mg Oral Q breakfast   traZODone  50 mg Oral QHS     Data Reviewed:   CBG:  Recent Labs  Lab 06/29/21 1908 06/29/21 2128 06/29/21 2347 06/30/21 0826 06/30/21 1140  GLUCAP 208* 186* 179* 260* 184*    SpO2: 98 %    Vitals:   06/30/21 0933 06/30/21 0937 06/30/21 1004 06/30/21 1148  BP: (!) 177/85  (!) 193/117 (!) 190/125  Pulse: 74  70   Resp:   20   Temp:  97.7 F (36.5 C) 98.3 F (  36.8 C)   TempSrc:  Oral Oral   SpO2:   98%   Weight:      Height:        No intake or output data in the 24 hours ending 06/30/21 1612  No intake/output data recorded.  Filed Weights   06/29/21 1143  Weight: 132 kg    Data Reviewed: Basic Metabolic Panel: Recent Labs  Lab 06/24/21 1211 06/29/21 1154 06/29/21 1912 06/30/21 0450  NA 140 136 139 135  K 5.3* 4.6 5.5* 4.7  CL 104 97*  --  100  CO2 27 25  --  26  GLUCOSE 299* 341*  --  293*  BUN 32* 32*  --  36*  CREATININE 1.87* 1.85*  --  1.99*  CALCIUM 9.1 8.7*  --  8.8*   Liver  Function Tests: Recent Labs  Lab 06/24/21 1211 06/29/21 1154  AST 137* 143*  ALT 87* 111*  ALKPHOS 79 81  BILITOT 0.3 0.5  PROT 6.5 6.8  ALBUMIN 3.5 3.6   No results for input(s): LIPASE, AMYLASE in the last 168 hours. No results for input(s): AMMONIA in the last 168 hours. CBC: Recent Labs  Lab 06/24/21 1217 06/29/21 1154 06/29/21 1912 06/30/21 0450  WBC 9.9 10.4  --  12.1*  NEUTROABS 8,623*  --   --   --   HGB 10.9* 11.6* 12.9* 11.7*  HCT 35.0* 39.5 38.0* 38.2*  MCV 95.1 100.8*  --  97.7  PLT 210 253  --  242   Cardiac Enzymes: No results for input(s): CKTOTAL, CKMB, CKMBINDEX, TROPONINI in the last 168 hours. BNP (last 3 results) Recent Labs    03/04/21 1814 06/29/21 1850  BNP 837.4* 151.4*    ProBNP (last 3 results) Recent Labs    06/07/21 1627 06/24/21 1217  PROBNP 1,038* 1,121*    CBG: Recent Labs  Lab 06/29/21 1908 06/29/21 2128 06/29/21 2347 06/30/21 0826 06/30/21 1140  GLUCAP 208* 186* 179* 260* 184*       Radiology Reports  DG Chest 2 View  Result Date: 06/29/2021 CLINICAL DATA:  Shortness of breath. EXAM: CHEST - 2 VIEW COMPARISON:  None. FINDINGS: Stable cardiomegaly. Minimal bibasilar subsegmental atelectasis is noted. The visualized skeletal structures are unremarkable. IMPRESSION: Minimal bibasilar subsegmental atelectasis. Electronically Signed   By: Marijo Conception M.D.   On: 06/29/2021 12:48       Antibiotics: Anti-infectives (From admission, onward)    Start     Dose/Rate Route Frequency Ordered Stop   06/30/21 1000  dolutegravir-lamiVUDine (DOVATO) 50-300 MG per tablet 1 tablet  Status:  Discontinued        1 tablet Oral Daily 06/29/21 2229 06/30/21 0246   06/30/21 1000  dolutegravir (TIVICAY) tablet 50 mg       See Hyperspace for full Linked Orders Report.   50 mg Oral Daily 06/30/21 0246     06/30/21 1000  lamiVUDine (EPIVIR) tablet 300 mg       See Hyperspace for full Linked Orders Report.   300 mg Oral Daily  06/30/21 0246     06/29/21 2230  doxycycline (VIBRA-TABS) tablet 100 mg        100 mg Oral Every 12 hours 06/29/21 2229           DVT prophylaxis: Lovenox  Code Status: Full code  Family Communication: No family at bedside   Consultants: Cardiology  Procedures:     Objective    Physical Examination:   General-appears in no acute  distress Heart-S1-S2, regular, no murmur auscultated Lungs-bilateral wheezing auscultated Abdomen-soft, nontender, no organomegaly Extremities-3+ pitting edema in the lower extremities  Neuro-alert, oriented x3, no focal deficit noted  Status is: Inpatient  Dispo: The patient is from: Home              Anticipated d/c is to: Home              Anticipated d/c date is: 07/01/2021              Patient currently not stable for discharge  Barrier to discharge-ongoing treatment for CHF exacerbation  COVID-19 Labs  No results for input(s): DDIMER, FERRITIN, LDH, CRP in the last 72 hours.  Lab Results  Component Value Date   SARSCOV2NAA NEGATIVE 06/29/2021   SARSCOV2NAA NEGATIVE 04/18/2021   [place] NEGATIVE 03/04/2021   [place] NEGATIVE 02/11/2021            Recent Results (from the past 240 hour(s))  Resp Panel by RT-PCR (Flu A&B, Covid) Nasopharyngeal Swab     Status: None   Collection Time: 06/29/21  6:34 PM   Specimen: Nasopharyngeal Swab; Nasopharyngeal(NP) swabs in vial transport medium  Result Value Ref Range Status   SARS Coronavirus 2 by RT PCR NEGATIVE NEGATIVE Final    Comment: (NOTE) SARS-CoV-2 target nucleic acids are NOT DETECTED.  The SARS-CoV-2 RNA is generally detectable in upper respiratory specimens during the acute phase of infection. The lowest concentration of SARS-CoV-2 viral copies this assay can detect is 138 copies/mL. A negative result does not preclude SARS-Cov-2 infection and should not be used as the sole basis for treatment or other patient management decisions. A negative result  may occur with  improper specimen collection/handling, submission of specimen other than nasopharyngeal swab, presence of viral mutation(s) within the areas targeted by this assay, and inadequate number of viral copies(<138 copies/mL). A negative result must be combined with clinical observations, patient history, and epidemiological information. The expected result is Negative.  Fact Sheet for Patients:  EntrepreneurPulse.com.au  Fact Sheet for Healthcare Providers:  IncredibleEmployment.be  This test is no t yet approved or cleared by the [place] FDA and  has been authorized for detection and/or diagnosis of SARS-CoV-2 by FDA under an Emergency Use Authorization (EUA). This EUA will remain  in effect (meaning this test can be used) for the duration of the COVID-19 declaration under Section 564(b)(1) of the Act, 21 U.S.C.section 360bbb-3(b)(1), unless the authorization is terminated  or revoked sooner.       Influenza A by PCR NEGATIVE NEGATIVE Final   Influenza B by PCR NEGATIVE NEGATIVE Final    Comment: (NOTE) The Xpert Xpress SARS-CoV-2/FLU/RSV plus assay is intended as an aid in the diagnosis of influenza from Nasopharyngeal swab specimens and should not be used as a sole basis for treatment. Nasal washings and aspirates are unacceptable for Xpert Xpress SARS-CoV-2/FLU/RSV testing.  Fact Sheet for Patients: EntrepreneurPulse.com.au  Fact Sheet for Healthcare Providers: IncredibleEmployment.be  This test is not yet approved or cleared by the [place] FDA and has been authorized for detection and/or diagnosis of SARS-CoV-2 by FDA under an Emergency Use Authorization (EUA). This EUA will remain in effect (meaning this test can be used) for the duration of the COVID-19 declaration under Section 564(b)(1) of the Act, 21 U.S.C. section 360bbb-3(b)(1), unless the authorization is terminated  or revoked.  Performed at [facility], [place] [address], [place], [place] [address]     [facility] If 7PM-7AM, please contact night-coverage at [email or web address], Office  [phone]   06/30/2021, 4:12 PM  LOS: 0 days

## 2021-06-30 NOTE — ED Notes (Signed)
Pt going to bathroom

## 2021-06-30 NOTE — Consult Note (Signed)
Date of Admission:  06/29/2021          Reason for Consult:  HIV disease   Referring Provider:  Eleonore Chiquito, MD   Assessment:  HIV disease, relatively well controlled on Dovato Hx of treatment for PCP pneumonia with protracted hospitalization requiring mechanical intubation ventilation and tracheostomy and acute renal failure that required hemodialysis during part of his stay History of gout flare that was treated with steroids--which she still has been on IRIS Massive weight gain, much of which I think is due to volume overload in context of inadequate  diuresis in setting of high creatinine and his only receiving 40mg  of po lasix daily. Some of the weight gain is also undoubtedly associated with his prednisone and also could be potentially related to his having suppressed his virus with an INSTI based regimen Diastolic heart falure Chronic kidney disease Uncontrolled Hypertension "" Diabetes Asthma Anxiety and likely PTSD after his protracted hospitalization on Klonopin 4 times daily  Plan:  He is continuing on components of Dovato in the hospital but will resume Dovato at home along with PCP prophylaxis with Bactrim Greatly appreciate cardiology seeing him and primary team taking care of him he has responded well so far to IV diuretics.  He would benefit by being followed by cardiology in the outpatient arena in addition to primary care Would like to get him off his prednisone but he has been on it for more than 3 months it appears would drop his dose to 20 mg tomorrow and send him home with a taper of 20 mg for a week followed by 10 mg for a week and then DC them altogether to prevent AI He needs a long acting inhaled steroid to help him avoid having to use his MDI so much and to control his asthma I agree with nutrition consult which she is asking for as well.  He was also asking about physical therapy as he has missed his outpatient physical therapy appointment with this  admission.   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 (HCC)   Hypertensive urgency   CKD (chronic kidney disease) stage 3, GFR 30-59 ml/min (HCC)   Scheduled Meds:  [START ON 07/01/2021] atorvastatin  10 mg Oral Daily   dolutegravir  50 mg Oral Daily   And   lamiVUDine  300 mg Oral Daily   doxycycline  100 mg Oral Q12H   enoxaparin (LOVENOX) injection  0.5 mg/kg Subcutaneous Q24H   famotidine  40 mg Oral Daily   fluticasone furoate-vilanterol  1 puff Inhalation Daily   furosemide  40 mg Intravenous Q12H   gabapentin  600 mg Oral TID   insulin aspart  0-15 Units Subcutaneous TID WC   insulin aspart  0-5 Units Subcutaneous QHS   insulin glargine-yfgn  5 Units Subcutaneous Daily   ipratropium-albuterol  3 mL Nebulization Q6H   irbesartan  150 mg Oral Daily   isosorbide-hydrALAZINE  1 tablet Oral TID   magnesium oxide  400 mg Oral BID   metoprolol tartrate  50 mg Oral BID   predniSONE  60 mg Oral Q breakfast   traZODone  50 mg Oral QHS   Continuous Infusions: PRN Meds:.acetaminophen **OR** acetaminophen, clonazePAM, metoprolol tartrate, ondansetron **OR** ondansetron (ZOFRAN) IV, oxyCODONE, senna-docusate  HPI: Bruce Hunter is a 45 y.o. male  who was recently diagnosed  with HIV and AIDS.  He recently had a retracted hospital stay from March 30, 2021 till the very end of August 2022.  Admitted with respiratory failure that required mechanical intubation and ventilation and ultimately underwent tracheostomy.  He completed a treatment course for PCP pneumonia though PCP was never proven he did have evidence of deteriorating few days during his stay at [facility] when he was off anti-PCP medications.   Developed septic physiology with shock and acute renal failure requiring CVVHD briefly.   Urgently was able to come off the ventilator and his  renal function recovered completely.  His hospital course was further complicated by diffuse gout that required a corticosteroid taper.  He also was diagnosed with diabetes mellitus while in the hospital and now is on insulin.  He ultimately is discharged in the inpatient unit to inpatient rehab where he was further taken care of.   My partners Dr. Gale Journey and Baxter Flattery saw him again when he had recurrent fevers.  F AFB cultures were sent as well as testing for gonorrhea chlamydia CT chest abdomen pelvis.     He was started back on corticosteroids with resolution of his fevers.  Had thought that he was off the prednisone but apparently has been continued on it.  The last 2 visits I have seen him in clinic he has been significantly volume overloaded with severe edema that was making it difficult for him to walk.  The last time I saw him in clinic he had blood pressures in the 200s over 120s when he arrived and was short of breath and visibly uncomfortable.  I tried to arrange for inpatient admission but he would not do it at the time due to his desire to make sure that his dog was well taken care of while he was in the hospital.  Ultimately he did agree to come to the hospital which he did yesterday.  He has been admitted the hospitalist service and started on intravenous Lasix which is made it dramatic improvement in his edema and and his symptoms of dyspnea.  He is also being treated for a "COPD exacerbation with steroids being increased.  As mentioned to my clinic note he has gained an enormous amount of weight including 100 pounds of weight over a month.  While some of the weight may be due to his HIV being controlled and related to him being on a integrase strand transfer inhibitor I would think a great deal more of it is related to continuation of his prednisone and also significant volume overload in the context of inadequate diuresis.  He very much would benefit by being followed by cardiology  in the outpatient setting in addition to his primary care physician Dr. Jonelle Sidle.  He is also agreeable to being seen by endocrinology to get greater control of his diabetes.  He is very much interested in weight loss and would like to see a nutritionist.  In terms of his asthma he should clearly be on a long-acting inhaled corticosteroid so that he does not have to use his albuterol several times a week.  He also is taking Klonopin 4 times a day for his anxiety and was asking if this could be restored since it is currently written as a as needed basis.  I spent 81 minutes with the patient including than 50% of the time in face to face counseling of the patient regarding his HIV disease his IRIS, his prior PCP pneumonia his diastolic heart failure his  chronic kidney disease is volume overload his asthma is anxiety his weight gain personally reviewing mission chest x-ray BNP metabolic panel CBC with differential along with review of medical records in preparation for the visit and during the visit and in coordination of his care.    Review of Systems: Review of Systems  Constitutional:  Positive for malaise/fatigue. Negative for chills, fever and weight loss.  HENT:  Negative for congestion and sore throat.   Eyes:  Negative for blurred vision and photophobia.  Respiratory:  Positive for cough, shortness of breath and wheezing.   Cardiovascular:  Positive for leg swelling. Negative for chest pain and palpitations.  Gastrointestinal:  Negative for abdominal pain, blood in stool, constipation, diarrhea, heartburn, melena, nausea and vomiting.  Genitourinary:  Negative for dysuria, flank pain and hematuria.  Musculoskeletal:  Positive for back pain and myalgias. Negative for falls and joint pain.  Skin:  Negative for itching and rash.  Neurological:  Positive for weakness. Negative for dizziness, focal weakness, loss of consciousness and headaches.  Endo/Heme/Allergies:  Does not bruise/bleed  easily.  Psychiatric/Behavioral:  Negative for depression and suicidal ideas. The patient is nervous/anxious. The patient does not have insomnia.    Past Medical History:  Diagnosis Date   Anxiety 05/07/2021   Asthma    Diabetes mellitus without complication ([place])    Gout    Hyperlipidemia    Hypertension    Hypertensive urgency 06/24/2021   Obesity    Obesity 06/24/2021   Pulmonary edema 06/24/2021    Social History   Tobacco Use   Smoking status: Every Day    Packs/day: 1.00    Types: Cigarettes   Smokeless tobacco: Never  Substance Use Topics   Alcohol use: Yes   Drug use: Yes    Frequency: 1.0 times per week    Types: Marijuana    Comment: marijuana at midnight last night; daily    Family History  Problem Relation Age of Onset   Asthma Mother    Cancer Mother    Diabetes Mother    Obesity Mother    Obesity Sister    Diabetes Brother    Obesity Brother    Cancer Maternal Grandmother    Diabetes Maternal Grandmother    Diabetes Other    Allergies  Allergen Reactions   Lactose Intolerance ([place])     OBJECTIVE: Blood pressure (!) 190/125, pulse 70, temperature 98.3 F (36.8 C), temperature source Oral, resp. rate 20, height 5\' 10"  (1.778 m), weight 132 kg, SpO2 98 %.  Physical Exam Constitutional:      General: He is not in acute distress.    Appearance: Normal appearance. He is well-developed. He is obese. He is not ill-appearing or diaphoretic.  HENT:     Head: Normocephalic and atraumatic.     Right Ear: Hearing and external ear normal.     Left Ear: Hearing and external ear normal.     Nose: Nose normal. No nasal deformity or rhinorrhea.  Eyes:     General: No scleral icterus.    Conjunctiva/sclera: Conjunctivae normal.     Right eye: Right conjunctiva is not injected.     Left eye: Left conjunctiva is not injected.     Pupils: Pupils are equal, round, and reactive to light.  Neck:     Vascular: No JVD.  Cardiovascular:     Rate and Rhythm:  Normal rate and regular rhythm.     Heart sounds: S1 normal and S2 normal.  Pulmonary:  Effort: Pulmonary effort is normal.     Breath sounds: Wheezing present.  Abdominal:     General: Bowel sounds are normal. There is no distension.     Palpations: Abdomen is soft.     Tenderness: There is no abdominal tenderness.  Musculoskeletal:        General: Normal range of motion.     Right shoulder: Normal.     Left shoulder: Normal.     Cervical back: Normal range of motion and neck supple.     Right hip: Normal.     Left hip: Normal.     Right knee: Normal.     Left knee: Normal.     Right lower leg: Edema present.     Left lower leg: Edema present.  Lymphadenopathy:     Head:     Right side of head: No submandibular, preauricular or posterior auricular adenopathy.     Left side of head: No submandibular, preauricular or posterior auricular adenopathy.     Cervical:     Right cervical: No superficial or deep cervical adenopathy.    Left cervical: No superficial or deep cervical adenopathy.  Skin:    General: Skin is warm and dry.     Coloration: Skin is not pale.     Findings: No abrasion, bruising, ecchymosis, erythema, lesion or rash.     Nails: There is no clubbing.  Neurological:     General: No focal deficit present.     Mental Status: He is alert and oriented to person, place, and time.     Sensory: No sensory deficit.     Coordination: Coordination normal.     Gait: Gait normal.  Psychiatric:        Attention and Perception: He is attentive.        Mood and Affect: Mood normal.        Speech: Speech normal.        Behavior: Behavior normal. Behavior is cooperative.        Thought Content: Thought content normal.        Judgment: Judgment normal.    Lab Results Lab Results  Component Value Date   WBC 12.1 (H) 06/30/2021   HGB 11.7 (L) 06/30/2021   HCT 38.2 (L) 06/30/2021   MCV 97.7 06/30/2021   PLT 242 06/30/2021    Lab Results  Component Value Date    CREATININE 1.99 (H) 06/30/2021   BUN 36 (H) 06/30/2021   NA 135 06/30/2021   K 4.7 06/30/2021   CL 100 06/30/2021   CO2 26 06/30/2021    Lab Results  Component Value Date   ALT 111 (H) 06/29/2021   AST 143 (H) 06/29/2021   ALKPHOS 81 06/29/2021   BILITOT 0.5 06/29/2021     Microbiology: Recent Results (from the past 240 hour(s))  Resp Panel by RT-PCR (Flu A&B, Covid) Nasopharyngeal Swab     Status: None   Collection Time: 06/29/21  6:34 PM   Specimen: Nasopharyngeal Swab; Nasopharyngeal(NP) swabs in vial transport medium  Result Value Ref Range Status   SARS Coronavirus 2 by RT PCR NEGATIVE NEGATIVE Final    Comment: (NOTE) SARS-CoV-2 target nucleic acids are NOT DETECTED.  The SARS-CoV-2 RNA is generally detectable in upper respiratory specimens during the acute phase of infection. The lowest concentration of SARS-CoV-2 viral copies this assay can detect is 138 copies/mL. A negative result does not preclude SARS-Cov-2 infection and should not be used as the sole basis for treatment or  other patient management decisions. A negative result may occur with  improper specimen collection/handling, submission of specimen other than nasopharyngeal swab, presence of viral mutation(s) within the areas targeted by this assay, and inadequate number of viral copies(<138 copies/mL). A negative result must be combined with clinical observations, patient history, and epidemiological information. The expected result is Negative.  Fact Sheet for Patients:  EntrepreneurPulse.com.au  Fact Sheet for Healthcare Providers:  IncredibleEmployment.be  This test is no t yet approved or cleared by the [place] FDA and  has been authorized for detection and/or diagnosis of SARS-CoV-2 by FDA under an Emergency Use Authorization (EUA). This EUA will remain  in effect (meaning this test can be used) for the duration of the COVID-19 declaration under Section  564(b)(1) of the Act, 21 U.S.C.section 360bbb-3(b)(1), unless the authorization is terminated  or revoked sooner.       Influenza A by PCR NEGATIVE NEGATIVE Final   Influenza B by PCR NEGATIVE NEGATIVE Final    Comment: (NOTE) The Xpert Xpress SARS-CoV-2/FLU/RSV plus assay is intended as an aid in the diagnosis of influenza from Nasopharyngeal swab specimens and should not be used as a sole basis for treatment. Nasal washings and aspirates are unacceptable for Xpert Xpress SARS-CoV-2/FLU/RSV testing.  Fact Sheet for Patients: EntrepreneurPulse.com.au  Fact Sheet for Healthcare Providers: IncredibleEmployment.be  This test is not yet approved or cleared by the [place] FDA and has been authorized for detection and/or diagnosis of SARS-CoV-2 by FDA under an Emergency Use Authorization (EUA). This EUA will remain in effect (meaning this test can be used) for the duration of the COVID-19 declaration under Section 564(b)(1) of the Act, 21 U.S.C. section 360bbb-3(b)(1), unless the authorization is terminated or revoked.  Performed at [facility], [place] [address], [place], [place] [address]     Alcide Evener, [place] [facility] [phone] pager  06/30/2021, 3:34 PM

## 2021-06-30 NOTE — Plan of Care (Signed)
  Problem: Education: Goal: Knowledge of General Education information will improve Description Including pain rating scale, medication(s)/side effects and non-pharmacologic comfort measures Outcome: Progressing   Problem: Health Behavior/Discharge Planning: Goal: Ability to manage health-related needs will improve Outcome: Progressing   

## 2021-07-01 ENCOUNTER — Inpatient Hospital Stay (HOSPITAL_COMMUNITY): Payer: Medicaid Other

## 2021-07-01 DIAGNOSIS — F17210 Nicotine dependence, cigarettes, uncomplicated: Secondary | ICD-10-CM

## 2021-07-01 DIAGNOSIS — I129 Hypertensive chronic kidney disease with stage 1 through stage 4 chronic kidney disease, or unspecified chronic kidney disease: Secondary | ICD-10-CM

## 2021-07-01 DIAGNOSIS — I5031 Acute diastolic (congestive) heart failure: Secondary | ICD-10-CM

## 2021-07-01 DIAGNOSIS — I700 Atherosclerosis of aorta: Secondary | ICD-10-CM

## 2021-07-01 DIAGNOSIS — E1165 Type 2 diabetes mellitus with hyperglycemia: Secondary | ICD-10-CM

## 2021-07-01 DIAGNOSIS — B20 Human immunodeficiency virus [HIV] disease: Secondary | ICD-10-CM | POA: Diagnosis not present

## 2021-07-01 DIAGNOSIS — N1832 Chronic kidney disease, stage 3b: Secondary | ICD-10-CM

## 2021-07-01 DIAGNOSIS — J441 Chronic obstructive pulmonary disease with (acute) exacerbation: Secondary | ICD-10-CM | POA: Diagnosis not present

## 2021-07-01 DIAGNOSIS — Z794 Long term (current) use of insulin: Secondary | ICD-10-CM

## 2021-07-01 LAB — BASIC METABOLIC PANEL
Anion gap: 10 (ref 5–15)
BUN: 43 mg/dL — ABNORMAL HIGH (ref 6–20)
CO2: 28 mmol/L (ref 22–32)
Calcium: 9.1 mg/dL (ref 8.9–10.3)
Chloride: 98 mmol/L (ref 98–111)
Creatinine, Ser: 2.28 mg/dL — ABNORMAL HIGH (ref 0.61–1.24)
GFR, Estimated: 35 mL/min — ABNORMAL LOW (ref >60)
Glucose, Bld: 222 mg/dL — ABNORMAL HIGH (ref 70–99)
Potassium: 4.3 mmol/L (ref 3.5–5.1)
Sodium: 136 mmol/L (ref 135–145)

## 2021-07-01 LAB — LIPID PANEL
Cholesterol: 219 mg/dL — ABNORMAL HIGH (ref 0–200)
HDL: 67 mg/dL (ref >40)
LDL Cholesterol: 129 mg/dL — ABNORMAL HIGH (ref 0–99)
Total CHOL/HDL Ratio: 3.3 RATIO
Triglycerides: 113 mg/dL (ref <150)
VLDL: 23 mg/dL (ref 0–40)

## 2021-07-01 LAB — GLUCOSE, CAPILLARY
Glucose-Capillary: 209 mg/dL — ABNORMAL HIGH (ref 70–99)
Glucose-Capillary: 122 mg/dL — ABNORMAL HIGH (ref 70–99)
Glucose-Capillary: 267 mg/dL — ABNORMAL HIGH (ref 70–99)
Glucose-Capillary: 197 mg/dL — ABNORMAL HIGH (ref 70–99)

## 2021-07-01 MED ORDER — ASPIRIN EC 81 MG PO TBEC
81.0000 mg | DELAYED_RELEASE_TABLET | Freq: Every day | ORAL | Status: DC
  Administered 2021-07-01 – 2021-07-04 (×4): 81 mg via ORAL
  Filled 2021-07-01 (×4): qty 1

## 2021-07-01 MED ORDER — PREDNISONE 20 MG PO TABS
20.0000 mg | ORAL_TABLET | Freq: Every day | ORAL | Status: AC
  Administered 2021-07-02: 20 mg via ORAL
  Filled 2021-07-01: qty 1

## 2021-07-01 MED ORDER — FUROSEMIDE 10 MG/ML IJ SOLN
40.0000 mg | Freq: Every day | INTRAMUSCULAR | Status: DC
  Filled 2021-07-01: qty 4

## 2021-07-01 MED ORDER — ISOSORB DINITRATE-HYDRALAZINE 20-37.5 MG PO TABS
2.0000 | ORAL_TABLET | Freq: Three times a day (TID) | ORAL | Status: DC
  Administered 2021-07-01 – 2021-07-04 (×9): 2 via ORAL
  Filled 2021-07-01 (×11): qty 2

## 2021-07-01 MED ORDER — METHOCARBAMOL 500 MG PO TABS
500.0000 mg | ORAL_TABLET | Freq: Four times a day (QID) | ORAL | Status: DC | PRN
  Administered 2021-07-01 – 2021-07-04 (×9): 500 mg via ORAL
  Filled 2021-07-01 (×9): qty 1

## 2021-07-01 MED ORDER — IPRATROPIUM-ALBUTEROL 0.5-2.5 (3) MG/3ML IN SOLN
3.0000 mL | Freq: Two times a day (BID) | RESPIRATORY_TRACT | Status: DC
  Administered 2021-07-01 – 2021-07-04 (×7): 3 mL via RESPIRATORY_TRACT
  Filled 2021-07-01 (×7): qty 3

## 2021-07-01 MED ORDER — PREDNISONE 10 MG PO TABS: ORAL_TABLET | ORAL | 0 refills | Status: AC

## 2021-07-01 NOTE — Progress Notes (Signed)
Subjective:  Feeling much better   Antibiotics:  Anti-infectives (From admission, onward)    Start     Dose/Rate Route Frequency Ordered Stop   06/30/21 1000  dolutegravir-lamiVUDine (DOVATO) 50-300 MG per tablet 1 tablet  Status:  Discontinued        1 tablet Oral Daily 06/29/21 2229 06/30/21 0246   06/30/21 1000  dolutegravir (TIVICAY) tablet 50 mg       See Hyperspace for full Linked Orders Report.   50 mg Oral Daily 06/30/21 0246     06/30/21 1000  lamiVUDine (EPIVIR) tablet 300 mg       See Hyperspace for full Linked Orders Report.   300 mg Oral Daily 06/30/21 0246     06/29/21 2230  doxycycline (VIBRA-TABS) tablet 100 mg        100 mg Oral Every 12 hours 06/29/21 2229         Medications: Scheduled Meds:  aspirin EC  81 mg Oral Daily   atorvastatin  10 mg Oral Daily   dolutegravir  50 mg Oral Daily   And   lamiVUDine  300 mg Oral Daily   doxycycline  100 mg Oral Q12H   enoxaparin (LOVENOX) injection  0.5 mg/kg Subcutaneous Q24H   famotidine  40 mg Oral Daily   fluticasone furoate-vilanterol  1 puff Inhalation Daily   furosemide  40 mg Intravenous Daily   gabapentin  600 mg Oral TID   insulin aspart  0-15 Units Subcutaneous TID WC   insulin aspart  0-5 Units Subcutaneous QHS   insulin glargine-yfgn  5 Units Subcutaneous Daily   ipratropium-albuterol  3 mL Nebulization BID   isosorbide-hydrALAZINE  2 tablet Oral TID   magnesium oxide  400 mg Oral BID   metoprolol tartrate  50 mg Oral BID   [START ON 07/02/2021] predniSONE  20 mg Oral Q breakfast   traZODone  50 mg Oral QHS   Continuous Infusions: PRN Meds:.acetaminophen **OR** acetaminophen, clonazePAM, methocarbamol, metoprolol tartrate, ondansetron **OR** ondansetron (ZOFRAN) IV, oxyCODONE, senna-docusate    Objective: Weight change:   Intake/Output Summary (Last 24 hours) at 07/01/2021 1702 Last data filed at 07/01/2021 0113 Gross per 24 hour  Intake 360 ml  Output --  Net 360 ml    Blood pressure (!) 176/118, pulse 75, temperature 98.8 F (37.1 C), temperature source Oral, resp. rate 17, height 5\' 10"  (1.778 m), weight 132 kg, SpO2 94 %. Temp:  [98.2 F (36.8 C)-98.8 F (37.1 C)] 98.8 F (37.1 C) (10/27 1207) Pulse Rate:  [71-85] 75 (10/27 1612) Resp:  [17-20] 17 (10/27 1207) BP: (155-194)/(98-118) 176/118 (10/27 1612) SpO2:  [93 %-95 %] 94 % (10/27 1212)  Physical Exam: Physical Exam Constitutional:      Appearance: He is well-developed. He is obese.  HENT:     Head: Normocephalic and atraumatic.  Eyes:     Conjunctiva/sclera: Conjunctivae normal.  Cardiovascular:     Rate and Rhythm: Normal rate and regular rhythm.  Pulmonary:     Effort: Pulmonary effort is normal. No respiratory distress.     Breath sounds: Normal breath sounds. No wheezing.  Abdominal:     General: There is no distension.     Palpations: Abdomen is soft.  Musculoskeletal:        General: Normal range of motion.     Cervical back: Normal range of motion and neck supple.     Right lower leg: Edema present.     Left lower  leg: Edema present.  Skin:    General: Skin is warm and dry.     Findings: No erythema or rash.  Neurological:     General: No focal deficit present.     Mental Status: He is alert and oriented to person, place, and time.  Psychiatric:        Mood and Affect: Mood normal.        Behavior: Behavior normal.        Thought Content: Thought content normal.        Judgment: Judgment normal.     CBC:    BMET Recent Labs    06/30/21 0450 07/01/21 0236  NA 135 136  K 4.7 4.3  CL 100 98  CO2 26 28  GLUCOSE 293* 222*  BUN 36* 43*  CREATININE 1.99* 2.28*  CALCIUM 8.8* 9.1     Liver Panel  Recent Labs    06/29/21 1154  PROT 6.8  ALBUMIN 3.6  AST 143*  ALT 111*  ALKPHOS 81  BILITOT 0.5       Sedimentation Rate No results for input(s): ESRSEDRATE in the last 72 hours. C-Reactive Protein No results for input(s): CRP in the last 72  hours.  Micro Results: Recent Results (from the past 720 hour(s))  Resp Panel by RT-PCR (Flu A&B, Covid) Nasopharyngeal Swab     Status: None   Collection Time: 06/29/21  6:34 PM   Specimen: Nasopharyngeal Swab; Nasopharyngeal(NP) swabs in vial transport medium  Result Value Ref Range Status   SARS Coronavirus 2 by RT PCR NEGATIVE NEGATIVE Final    Comment: (NOTE) SARS-CoV-2 target nucleic acids are NOT DETECTED.  The SARS-CoV-2 RNA is generally detectable in upper respiratory specimens during the acute phase of infection. The lowest concentration of SARS-CoV-2 viral copies this assay can detect is 138 copies/mL. A negative result does not preclude SARS-Cov-2 infection and should not be used as the sole basis for treatment or other patient management decisions. A negative result may occur with  improper specimen collection/handling, submission of specimen other than nasopharyngeal swab, presence of viral mutation(s) within the areas targeted by this assay, and inadequate number of viral copies(<138 copies/mL). A negative result must be combined with clinical observations, patient history, and epidemiological information. The expected result is Negative.  Fact Sheet for Patients:  EntrepreneurPulse.com.au  Fact Sheet for Healthcare Providers:  IncredibleEmployment.be  This test is no t yet approved or cleared by the [place] FDA and  has been authorized for detection and/or diagnosis of SARS-CoV-2 by FDA under an Emergency Use Authorization (EUA). This EUA will remain  in effect (meaning this test can be used) for the duration of the COVID-19 declaration under Section 564(b)(1) of the Act, 21 U.S.C.section 360bbb-3(b)(1), unless the authorization is terminated  or revoked sooner.       Influenza A by PCR NEGATIVE NEGATIVE Final   Influenza B by PCR NEGATIVE NEGATIVE Final    Comment: (NOTE) The Xpert Xpress SARS-CoV-2/FLU/RSV plus  assay is intended as an aid in the diagnosis of influenza from Nasopharyngeal swab specimens and should not be used as a sole basis for treatment. Nasal washings and aspirates are unacceptable for Xpert Xpress SARS-CoV-2/FLU/RSV testing.  Fact Sheet for Patients: EntrepreneurPulse.com.au  Fact Sheet for Healthcare Providers: IncredibleEmployment.be  This test is not yet approved or cleared by the [place] FDA and has been authorized for detection and/or diagnosis of SARS-CoV-2 by FDA under an Emergency Use Authorization (EUA). This EUA will remain in effect (  meaning this test can be used) for the duration of the COVID-19 declaration under Section 564(b)(1) of the Act, 21 U.S.C. section 360bbb-3(b)(1), unless the authorization is terminated or revoked.  Performed at [facility], [place] [address] [place], [place] [address]     Studies/Results: No results found.    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 male with  HIV/AIDS, having been treated for PCP PNA with antibiotics and steroids with prolonged steroids also in context of treating gout, IRIS, with comorbid morbid obesity with prior ARF requiring HD, CKD, DM obesity volume  overload and asthma  #1 HIV disease:  Reasonably controlled on Dovato (receiving components in house until drug is on formulary) + Bactrim fro PCP prevention  #2 Volume overload in context of diastolic HF, CKD  He has responded to diuretics but now bump in CR (which is reason why Dr. Jonelle Sidle and myself fel he would  be better managed acutely in the hospital to balance risks of diuresing enough vs too much  #3 Uncontrolled HTN: again another reason for hospitilization  #4 DM: made worse by steroids and weight gain. He is agreeable to being seen by Endocrine  #5 Asthma: now on steroid/LABA, MDI  #6 Smoking: says he will quit  #7 Steroids > 3 months: taper 20mg  x 7 days 10mg  x 7 days and DC  I will sign off and plan on seeing [place] in clinic  Please call with further questions.    LOS: 1 day   Alcide Evener 07/01/2021, 5:02 PM

## 2021-07-01 NOTE — Progress Notes (Addendum)
[facility]  PROGRESS NOTE  Bruce Hunter HRC:[id number] DOB: 18-Mar-1976 DOA: 06/29/2021 PCP: Elwyn Reach, MD   Brief HPI:   45 year old male with medical history of HIV/AIDS, hypertension, COPD, diabetes mellitus type 2 with neuropathy, obesity, CKD stage III, gout, CHF, tobacco use presents with complaints of shortness of breath and wheezing.  He was admitted few weeks ago with CHF exacerbation and respiratory failure that required intubation and mechanical ventilation and also tracheostomy.  At that time he was treated with PCP pneumonia and developed renal failure due to septic shock that has resolved.  He was started on prednisone at that time for gout and has been continued on that. In the ED patient received multiple DuoNeb treatments for COPD exacerbation chest x-ray showed bibasilar atelectasis but no acute infiltrate or consolidation    Subjective   Patient seen and examined, diuresed well with IV Lasix.  Wheezing has also improved.  Lower extremity swelling improved.   Assessment/Plan:    COPD exacerbation -Patient admitted with COPD exacerbation -Received duo nebs and Solu-Medrol in the ED -Started on prednisone 60 mg daily -Dose of prednisone changed to 20 mg daily, will have to be tapered over 2 weeks.  As patient has been taking prednisone for past 3 months -Continue doxycycline -Breo Ellipta once a day  Hypertensive urgency -Patient blood pressure was significantly elevated -Received oral and IV hydralazine in the ED -Continue Lopressor; started on BiDil today  Acute diastolic heart failure -Patient has significant volume overload -Received Lasix 60 mg IV in the ED with good diuresis -BUN/creatinine was found to be elevated after 1 dose of Lasix -Cardiology was consulted, dose of Lasix has been changed to once a day -Avapro on hold due to worsening renal function  Acute kidney injury on CKD stage III -Creatinine is rising after diuresis -Dose of  Lasix changed to 40 mg IV once a day -Follow renal function in a.m.  Diabetes mellitus type 2 -Continue Lantus 5 units subcu daily; sliding scale insulin with NovoLog -CBG has been elevated due to high-dose of prednisone -Dose of prednisone changed to 20 mg daily from tomorrow morning.  Hopefully it will improve the patient's blood glucose -Hemoglobin A1c 6.6   HIV -Continue Dovato -Started on Bactrim for PCP prophylaxis -ID has signed off - will follow patient in ID clinic  History of gout flare -Patient has been on prednisone for more than 3 months -He has been taking prednisone 30 mg daily at home -ID recommends to taper it slowly over the next 2 weeks -We will discharge on 20 mg daily for a week followed by 10 mg daily for a week and then stop   Scheduled medications:    aspirin EC  81 mg Oral Daily   atorvastatin  10 mg Oral Daily   dolutegravir  50 mg Oral Daily   And   lamiVUDine  300 mg Oral Daily   doxycycline  100 mg Oral Q12H   enoxaparin (LOVENOX) injection  0.5 mg/kg Subcutaneous Q24H   famotidine  40 mg Oral Daily   fluticasone furoate-vilanterol  1 puff Inhalation Daily   furosemide  40 mg Intravenous Daily   gabapentin  600 mg Oral TID   insulin aspart  0-15 Units Subcutaneous TID WC   insulin aspart  0-5 Units Subcutaneous QHS   insulin glargine-yfgn  5 Units Subcutaneous Daily   ipratropium-albuterol  3 mL Nebulization BID   isosorbide-hydrALAZINE  2 tablet Oral TID   magnesium oxide  400  mg Oral BID   metoprolol tartrate  50 mg Oral BID   [START ON 07/02/2021] predniSONE  20 mg Oral Q breakfast   traZODone  50 mg Oral QHS     Data Reviewed:   CBG:  Recent Labs  Lab 06/30/21 1705 06/30/21 2150 07/01/21 0739 07/01/21 1142 07/01/21 1652  GLUCAP 255* 192* 209* 122* 267*    SpO2: 94 %    Vitals:   07/01/21 0930 07/01/21 1207 07/01/21 1212 07/01/21 1612  BP:  (!) 194/110 (!) 188/116 (!) 176/118  Pulse: 84 80 71 75  Resp:  17    Temp:   98.8 F (37.1 C)    TempSrc:  Oral    SpO2:  94% 94%   Weight:      Height:         Intake/Output Summary (Last 24 hours) at 07/01/2021 1743 Last data filed at 07/01/2021 0113 Gross per 24 hour  Intake 360 ml  Output --  Net 360 ml    10/25 1901 - 10/27 0700 In: 360 [P.O.:360] Out: -   Filed Weights   06/29/21 1143  Weight: 132 kg    Data Reviewed: Basic Metabolic Panel: Recent Labs  Lab 06/29/21 1154 06/29/21 1912 06/30/21 0450 07/01/21 0236  NA 136 139 135 136  K 4.6 5.5* 4.7 4.3  CL 97*  --  100 98  CO2 25  --  26 28  GLUCOSE 341*  --  293* 222*  BUN 32*  --  36* 43*  CREATININE 1.85*  --  1.99* 2.28*  CALCIUM 8.7*  --  8.8* 9.1   Liver Function Tests: Recent Labs  Lab 06/29/21 1154  AST 143*  ALT 111*  ALKPHOS 81  BILITOT 0.5  PROT 6.8  ALBUMIN 3.6   No results for input(s): LIPASE, AMYLASE in the last 168 hours. No results for input(s): AMMONIA in the last 168 hours. CBC: Recent Labs  Lab 06/29/21 1154 06/29/21 1912 06/30/21 0450  WBC 10.4  --  12.1*  HGB 11.6* 12.9* 11.7*  HCT 39.5 38.0* 38.2*  MCV 100.8*  --  97.7  PLT 253  --  242   Cardiac Enzymes: No results for input(s): CKTOTAL, CKMB, CKMBINDEX, TROPONINI in the last 168 hours. BNP (last 3 results) Recent Labs    03/04/21 1814 06/29/21 1850  BNP 837.4* 151.4*    ProBNP (last 3 results) Recent Labs    06/07/21 1627 06/24/21 1217  PROBNP 1,038* 1,121*    CBG: Recent Labs  Lab 06/30/21 1705 06/30/21 2150 07/01/21 0739 07/01/21 1142 07/01/21 1652  GLUCAP 255* 192* 209* 122* 267*       Radiology Reports  No results found.     Antibiotics: Anti-infectives (From admission, onward)    Start     Dose/Rate Route Frequency Ordered Stop   06/30/21 1000  dolutegravir-lamiVUDine (DOVATO) 50-300 MG per tablet 1 tablet  Status:  Discontinued        1 tablet Oral Daily 06/29/21 2229 06/30/21 0246   06/30/21 1000  dolutegravir (TIVICAY) tablet 50 mg       See  Hyperspace for full Linked Orders Report.   50 mg Oral Daily 06/30/21 0246     06/30/21 1000  lamiVUDine (EPIVIR) tablet 300 mg       See Hyperspace for full Linked Orders Report.   300 mg Oral Daily 06/30/21 0246     06/29/21 2230  doxycycline (VIBRA-TABS) tablet 100 mg        100  mg Oral Every 12 hours 06/29/21 2229           DVT prophylaxis: Lovenox  Code Status: Full code  Family Communication: No family at bedside   Consultants: Cardiology  Procedures:     Objective    Physical Examination:   General-appears in no acute distress Heart-S1-S2, regular, no murmur auscultated Lungs-clear to auscultation bilaterally, no wheezing or crackles auscultated Abdomen-soft, nontender, no organomegaly Extremities-bilateral 2+ edema in the lower extremities Neuro-alert, oriented x3, no focal deficit noted  Status is: Inpatient  Dispo: The patient is from: Home              Anticipated d/c is to: Home              Anticipated d/c date is: 07/02/2021              Patient currently not stable for discharge  Barrier to discharge-ongoing treatment for CHF exacerbation  COVID-19 Labs  No results for input(s): DDIMER, FERRITIN, LDH, CRP in the last 72 hours.  Lab Results  Component Value Date   SARSCOV2NAA NEGATIVE 06/29/2021   SARSCOV2NAA NEGATIVE 04/18/2021   [place] NEGATIVE 03/04/2021   [place] NEGATIVE 02/11/2021            Recent Results (from the past 240 hour(s))  Resp Panel by RT-PCR (Flu A&B, Covid) Nasopharyngeal Swab     Status: None   Collection Time: 06/29/21  6:34 PM   Specimen: Nasopharyngeal Swab; Nasopharyngeal(NP) swabs in vial transport medium  Result Value Ref Range Status   SARS Coronavirus 2 by RT PCR NEGATIVE NEGATIVE Final    Comment: (NOTE) SARS-CoV-2 target nucleic acids are NOT DETECTED.  The SARS-CoV-2 RNA is generally detectable in upper respiratory specimens during the acute phase of infection. The  lowest concentration of SARS-CoV-2 viral copies this assay can detect is 138 copies/mL. A negative result does not preclude SARS-Cov-2 infection and should not be used as the sole basis for treatment or other patient management decisions. A negative result may occur with  improper specimen collection/handling, submission of specimen other than nasopharyngeal swab, presence of viral mutation(s) within the areas targeted by this assay, and inadequate number of viral copies(<138 copies/mL). A negative result must be combined with clinical observations, patient history, and epidemiological information. The expected result is Negative.  Fact Sheet for Patients:  EntrepreneurPulse.com.au  Fact Sheet for Healthcare Providers:  IncredibleEmployment.be  This test is no t yet approved or cleared by the [place] FDA and  has been authorized for detection and/or diagnosis of SARS-CoV-2 by FDA under an Emergency Use Authorization (EUA). This EUA will remain  in effect (meaning this test can be used) for the duration of the COVID-19 declaration under Section 564(b)(1) of the Act, 21 U.S.C.section 360bbb-3(b)(1), unless the authorization is terminated  or revoked sooner.       Influenza A by PCR NEGATIVE NEGATIVE Final   Influenza B by PCR NEGATIVE NEGATIVE Final    Comment: (NOTE) The Xpert Xpress SARS-CoV-2/FLU/RSV plus assay is intended as an aid in the diagnosis of influenza from Nasopharyngeal swab specimens and should not be used as a sole basis for treatment. Nasal washings and aspirates are unacceptable for Xpert Xpress SARS-CoV-2/FLU/RSV testing.  Fact Sheet for Patients: EntrepreneurPulse.com.au  Fact Sheet for Healthcare Providers: IncredibleEmployment.be  This test is not yet approved or cleared by the [place] FDA and has been authorized for detection and/or diagnosis of SARS-CoV-2 by FDA under  an Emergency Use Authorization (EUA). This  EUA will remain in effect (meaning this test can be used) for the duration of the COVID-19 declaration under Section 564(b)(1) of the Act, 21 U.S.C. section 360bbb-3(b)(1), unless the authorization is terminated or revoked.  Performed at [facility], [place] [address] [place], [place] [address]     [facility] If 7PM-7AM, please contact night-coverage at [email or web address], Office  [phone]   07/01/2021, 5:43 PM  LOS: 1 day

## 2021-07-01 NOTE — Progress Notes (Signed)
Inpatient Diabetes Program Recommendations  AACE/ADA: New Consensus Statement on Inpatient Glycemic Control (2015)  Target Ranges:  Prepandial:   less than 140 mg/dL      Peak postprandial:   less than 180 mg/dL (1-2 hours)      Critically ill patients:  140 - 180 mg/dL   Lab Results  Component Value Date   GLUCAP 122 (H) 07/01/2021   HGBA1C 6.6 (H) 06/30/2021    Review of Glycemic Control Results for BYREN, PANKOW (MRN [id number]) as of 07/01/2021 12:21  Ref. Range 06/30/2021 11:40 06/30/2021 17:05 06/30/2021 21:50 07/01/2021 07:39 07/01/2021 11:42  Glucose-Capillary Latest Ref Range: 70 - 99 mg/dL 184 (H) 255 (H) 192 (H) 209 (H) 122 (H)   Diabetes history: DM 2 Outpatient Diabetes medications:  Lantus 5 units daily Current orders for Inpatient glycemic control:  Semglee 5 units daily Novolog moderate tid with meals and HS Prednisone 60 mg daily Inpatient Diabetes Program Recommendations:   Consider increasing Semglee to 8 units daily.   Thanks,  Adah Perl, RN, BC-ADM Inpatient Diabetes Coordinator Pager [phone]  (8a-5p)

## 2021-07-02 ENCOUNTER — Ambulatory Visit: Payer: Medicaid Other

## 2021-07-02 DIAGNOSIS — J441 Chronic obstructive pulmonary disease with (acute) exacerbation: Secondary | ICD-10-CM | POA: Diagnosis not present

## 2021-07-02 DIAGNOSIS — B20 Human immunodeficiency virus [HIV] disease: Secondary | ICD-10-CM

## 2021-07-02 DIAGNOSIS — I5041 Acute combined systolic (congestive) and diastolic (congestive) heart failure: Secondary | ICD-10-CM

## 2021-07-02 DIAGNOSIS — I5043 Acute on chronic combined systolic (congestive) and diastolic (congestive) heart failure: Secondary | ICD-10-CM

## 2021-07-02 DIAGNOSIS — I429 Cardiomyopathy, unspecified: Secondary | ICD-10-CM

## 2021-07-02 DIAGNOSIS — F17200 Nicotine dependence, unspecified, uncomplicated: Secondary | ICD-10-CM

## 2021-07-02 LAB — BASIC METABOLIC PANEL
Anion gap: 9 (ref 5–15)
BUN: 45 mg/dL — ABNORMAL HIGH (ref 6–20)
CO2: 27 mmol/L (ref 22–32)
Calcium: 9 mg/dL (ref 8.9–10.3)
Chloride: 100 mmol/L (ref 98–111)
Creatinine, Ser: 2.11 mg/dL — ABNORMAL HIGH (ref 0.61–1.24)
GFR, Estimated: 39 mL/min — ABNORMAL LOW (ref >60)
Glucose, Bld: 192 mg/dL — ABNORMAL HIGH (ref 70–99)
Potassium: 4 mmol/L (ref 3.5–5.1)
Sodium: 136 mmol/L (ref 135–145)

## 2021-07-02 LAB — ECHOCARDIOGRAM LIMITED
AR max vel: 3.98 cm2
AV Peak grad: 3.8 mmHg
Ao pk vel: 0.98 m/s
Area-P 1/2: 4.06 cm2
Calc EF: 47.2 %
Height: 70 in
S' Lateral: 4.5 cm
Single Plane A2C EF: 45.5 %
Single Plane A4C EF: 48 %
Weight: 4656 oz

## 2021-07-02 LAB — GLUCOSE, CAPILLARY
Glucose-Capillary: 174 mg/dL — ABNORMAL HIGH (ref 70–99)
Glucose-Capillary: 211 mg/dL — ABNORMAL HIGH (ref 70–99)
Glucose-Capillary: 242 mg/dL — ABNORMAL HIGH (ref 70–99)
Glucose-Capillary: 173 mg/dL — ABNORMAL HIGH (ref 70–99)

## 2021-07-02 MED ORDER — PREDNISONE 20 MG PO TABS
20.0000 mg | ORAL_TABLET | Freq: Every day | ORAL | Status: DC
  Administered 2021-07-03 – 2021-07-04 (×2): 20 mg via ORAL
  Filled 2021-07-02 (×4): qty 1

## 2021-07-02 MED ORDER — PREDNISONE 10 MG PO TABS: 10.0000 mg | ORAL_TABLET | Freq: Every day | ORAL | Status: DC

## 2021-07-02 MED ORDER — DAPAGLIFLOZIN PROPANEDIOL 10 MG PO TABS
10.0000 mg | ORAL_TABLET | Freq: Every day | ORAL | Status: DC
  Administered 2021-07-02 – 2021-07-04 (×3): 10 mg via ORAL
  Filled 2021-07-02 (×3): qty 1

## 2021-07-02 MED ORDER — LABETALOL HCL 200 MG PO TABS
100.0000 mg | ORAL_TABLET | Freq: Two times a day (BID) | ORAL | Status: DC
  Administered 2021-07-02 – 2021-07-04 (×5): 100 mg via ORAL
  Filled 2021-07-02 (×5): qty 1

## 2021-07-02 NOTE — Progress Notes (Signed)
PROGRESS NOTE    Bruce Hunter  FFM:[id number] DOB: [birth year] DOA: 06/29/2021 PCP: Elwyn Reach, MD   Chief Complaint  Patient presents with   Shortness of Breath  Brief Narrative/Hospital Course:  Bruce Hunter, 45 y.o. male with PMH of HIV/ARDS, HTN, COPD diabetes mellitus type 2 with neuropathy, obesity, CKD stage III, gout, CHF, tobacco use presents with complaints of shortness of breath and wheezing.  He was admitted few weeks ago with CHF exacerbation and respiratory failure that required intubation and mechanical ventilation and also tracheostomy.  At that time he was treated with PCP pneumonia and developed renal failure due to septic shock that has resolved.  He was started on prednisone at that time for gout and has been continued on that. In the ED patient received multiple DuoNeb treatments for COPD exacerbation chest x-ray showed bibasilar atelectasis but no acute infiltrate or consolidation. Patient is being treated for COPD exacerbation hypertension urgency acute diastolic heart failure along with AKI on CKD.  Subjective: Seen this morning alert awake reports breathing is better but he still has significant lower leg edema up to knees. Asking about when he can go home has her daughter to pick up but agreeable to stay for ongoing diuresis  Assessment & Plan:  Acute CHF exacerbation with preserved TT:SVXBLT on admission 1121, previous was 1038 on 10/3.  Appreciate cardiology input holding Lasix due to his renal dysfunction, apply compression stocking for his lower leg edema.  Optimizing his medication changed to labetalol on increased dose of BiDil, adding [place].  Cardiology input appreciated. Monitor strict I/O,daly weight, electrolytes, Cont salt and fluid restricted diet.  Previous weight in August was 92.9 kg.  On admission 132 kg Filed Weights   06/29/21 1143 07/02/21 0851  Weight: 132 kg 127.4 kg   Net IO Since Admission: 200 mL [07/02/21 1159]   Acute COPD  exacerbation Moderate persistent asthma with acute exacerbation: Respiratory status improving continue on current prednisone taper, doxycycline Breo Ellipta.Monitor.  HIV/AIDS: ID is following, reasonably controlled on Dovato plus of Bactrim per PCP prevention.  T2DM with diabetic neuropathy: A1c stable 6.610/26.  Blood sugar fairly controlled,on Lantus 5 units and sliding scale insulin. Recent Labs  Lab 07/01/21 0739 07/01/21 1142 07/01/21 1652 07/01/21 2131 07/02/21 0756  GLUCAP 209* 122* 267* 197* 174*    Essential hypertension/Hypertensive urgency:  HLD: BP is poorly controlled, meds being adjusted see CHF plan. Continue aspirin, Lipitor   AKI on CKD stage IIIa baseline creat 1.4 in august 2022.  Discussed with cardiology recommended to hold off on his Lasix since we are adjusting other cardiac medication monitoring function closely Recent Labs  Lab 06/29/21 1154 06/30/21 0450 07/01/21 0236 07/02/21 0204  BUN 32* 36* 43* 45*  CREATININE 1.85* 1.99* 2.28* 2.11*   On steroids for more than 3 months is being tapered down, completing History of gout flare has been on prednisone for 3 months taper with 20 mg x 1 week then 10 mg x 1 week then stop patient has a prescription sent continue 20 mg through 11/2 then 10 mg through 11/9.   Class III Obesity:Patient's Body mass index is 40.3 kg/m. : Will benefit with PCP follow-up, weight loss  healthy lifestyle and outpatient sleep evaluation.  DVT prophylaxis: Place TED hose Start: 07/01/21 9030 Code Status:   Code Status: Full Code Family Communication: plan of care discussed with patient at bedside. Status is: Inpatient  Remains inpatient appropriate because: For ongoing diuresis for his congestive heart failure,  monitoring of renal function.  Objective: Vitals last 24 hrs: Vitals:   07/02/21 0400 07/02/21 0825 07/02/21 0851 07/02/21 1039  BP: (!) 158/93  (!) 160/94 (!) 167/100  Pulse: 78  83 65  Resp: 19   16  Temp:  97.8 F (36.6 C)     TempSrc:      SpO2: 97% 98%    Weight:   127.4 kg   Height:       Weight change:   Intake/Output Summary (Last 24 hours) at 07/02/2021 1124 Last data filed at 07/02/2021 7408 Gross per 24 hour  Intake 240 ml  Output 400 ml  Net -160 ml   Net IO Since Admission: 200 mL [07/02/21 1124]   Physical Examination: General exam: Aa0x3, obese, not in distress weak,older than stated age. HEENT:Oral mucosa moist, Ear/Nose WNL grossly,dentition normal. Respiratory system: B/l diminished BS, no use of accessory muscle, non tender. Cardiovascular system: S1 & S2 +,No JVD. Gastrointestinal system: Abdomen soft, NT,ND, BS+. Nervous System:Alert, awake, moving extremities. Extremities: edema present up to knee bilateral leg, distal peripheral pulses palpable.  Skin: No rashes, no icterus. MSK: Normal muscle bulk, tone, power.  Medications reviewed:  Scheduled Meds:  aspirin EC  81 mg Oral Daily   atorvastatin  10 mg Oral Daily   dapagliflozin propanediol  10 mg Oral Daily   dolutegravir  50 mg Oral Daily   And   lamiVUDine  300 mg Oral Daily   doxycycline  100 mg Oral Q12H   enoxaparin (LOVENOX) injection  0.5 mg/kg Subcutaneous Q24H   famotidine  40 mg Oral Daily   fluticasone furoate-vilanterol  1 puff Inhalation Daily   gabapentin  600 mg Oral TID   insulin aspart  0-15 Units Subcutaneous TID WC   insulin aspart  0-5 Units Subcutaneous QHS   insulin glargine-yfgn  5 Units Subcutaneous Daily   ipratropium-albuterol  3 mL Nebulization BID   isosorbide-hydrALAZINE  2 tablet Oral TID   labetalol  100 mg Oral BID   magnesium oxide  400 mg Oral BID   traZODone  50 mg Oral QHS   Continuous Infusions:  Diet Order             Diet heart healthy/carb modified Room service appropriate? Yes; Fluid consistency: Thin  Diet effective now                 Weight change:   Wt Readings from Last 3 Encounters:  07/02/21 127.4 kg  06/24/21 132 kg  04/27/21 92.9 kg   Consultants:see note  Procedures:see note Antimicrobials: Anti-infectives (From admission, onward)    Start     Dose/Rate Route Frequency Ordered Stop   06/30/21 1000  dolutegravir-lamiVUDine (DOVATO) 50-300 MG per tablet 1 tablet  Status:  Discontinued        1 tablet Oral Daily 06/29/21 2229 06/30/21 0246   06/30/21 1000  dolutegravir (TIVICAY) tablet 50 mg       See Hyperspace for full Linked Orders Report.   50 mg Oral Daily 06/30/21 0246     06/30/21 1000  lamiVUDine (EPIVIR) tablet 300 mg       See Hyperspace for full Linked Orders Report.   300 mg Oral Daily 06/30/21 0246     06/29/21 2230  doxycycline (VIBRA-TABS) tablet 100 mg        100 mg Oral Every 12 hours 06/29/21 2229        Culture/Microbiology    Component Value Date/Time   SDES SYNOVIAL FLUID  04/22/2021 2050   [place] RT ELBOW 04/22/2021 2050   CULT  04/22/2021 2050    NO GROWTH 3 DAYS Performed at [facility], [place] [address], [place], [place] [address]    REPTSTATUS 04/26/2021 FINAL 04/22/2021 2050    Other culture-see note  Unresulted Labs (From admission, onward)    None     Data Reviewed: I have personally reviewed following labs and imaging studies CBC: Recent Labs  Lab 06/29/21 1154 06/29/21 1912 06/30/21 0450  WBC 10.4  --  12.1*  HGB 11.6* 12.9* 11.7*  HCT 39.5 38.0* 38.2*  MCV 100.8*  --  97.7  PLT 253  --  810   Basic Metabolic Panel: Recent Labs  Lab 06/29/21 1154 06/29/21 1912 06/30/21 0450 07/01/21 0236 07/02/21 0204  NA 136 139 135 136 136  K 4.6 5.5* 4.7 4.3 4.0  CL 97*  --  100 98 100  CO2 25  --  26 28 27   GLUCOSE 341*  --  293* 222* 192*  BUN 32*  --  36* 43* 45*  CREATININE 1.85*  --  1.99* 2.28* 2.11*  CALCIUM 8.7*  --  8.8* 9.1 9.0   GFR: Estimated Creatinine Clearance: 59.3 mL/min (A) (by C-G formula based on SCr of 2.11 mg/dL (H)). Liver Function Tests: Recent Labs  Lab 06/29/21 1154  AST 143*  ALT 111*  ALKPHOS 81  BILITOT 0.5  PROT 6.8   ALBUMIN 3.6   No results for input(s): LIPASE, AMYLASE in the last 168 hours. No results for input(s): AMMONIA in the last 168 hours. Coagulation Profile: No results for input(s): INR, PROTIME in the last 168 hours. Cardiac Enzymes: No results for input(s): CKTOTAL, CKMB, CKMBINDEX, TROPONINI in the last 168 hours. BNP (last 3 results) Recent Labs    06/07/21 1627 06/24/21 1217  PROBNP 1,038* 1,121*   HbA1C: Recent Labs    06/30/21 0450  HGBA1C 6.6*   CBG: Recent Labs  Lab 07/01/21 0739 07/01/21 1142 07/01/21 1652 07/01/21 2131 07/02/21 0756  GLUCAP 209* 122* 267* 197* 174*   Lipid Profile: Recent Labs    07/01/21 0236  CHOL 219*  HDL 67  LDLCALC 129*  TRIG 113  CHOLHDL 3.3   Thyroid Function Tests: No results for input(s): TSH, T4TOTAL, FREET4, T3FREE, THYROIDAB in the last 72 hours. Anemia Panel: No results for input(s): VITAMINB12, FOLATE, FERRITIN, TIBC, IRON, RETICCTPCT in the last 72 hours. Sepsis Labs: No results for input(s): PROCALCITON, LATICACIDVEN in the last 168 hours.  Recent Results (from the past 240 hour(s))  Resp Panel by RT-PCR (Flu A&B, Covid) Nasopharyngeal Swab     Status: None   Collection Time: 06/29/21  6:34 PM   Specimen: Nasopharyngeal Swab; Nasopharyngeal(NP) swabs in vial transport medium  Result Value Ref Range Status   SARS Coronavirus 2 by RT PCR NEGATIVE NEGATIVE Final    Comment: (NOTE) SARS-CoV-2 target nucleic acids are NOT DETECTED.  The SARS-CoV-2 RNA is generally detectable in upper respiratory specimens during the acute phase of infection. The lowest concentration of SARS-CoV-2 viral copies this assay can detect is 138 copies/mL. A negative result does not preclude SARS-Cov-2 infection and should not be used as the sole basis for treatment or other patient management decisions. A negative result may occur with  improper specimen collection/handling, submission of specimen other than nasopharyngeal swab, presence  of viral mutation(s) within the areas targeted by this assay, and inadequate number of viral copies(<138 copies/mL). A negative result must be combined with  clinical observations, patient history, and epidemiological information. The expected result is Negative.  Fact Sheet for Patients:  EntrepreneurPulse.com.au  Fact Sheet for Healthcare Providers:  IncredibleEmployment.be  This test is no t yet approved or cleared by the [place] FDA and  has been authorized for detection and/or diagnosis of SARS-CoV-2 by FDA under an Emergency Use Authorization (EUA). This EUA will remain  in effect (meaning this test can be used) for the duration of the COVID-19 declaration under Section 564(b)(1) of the Act, 21 U.S.C.section 360bbb-3(b)(1), unless the authorization is terminated  or revoked sooner.       Influenza A by PCR NEGATIVE NEGATIVE Final   Influenza B by PCR NEGATIVE NEGATIVE Final    Comment: (NOTE) The Xpert Xpress SARS-CoV-2/FLU/RSV plus assay is intended as an aid in the diagnosis of influenza from Nasopharyngeal swab specimens and should not be used as a sole basis for treatment. Nasal washings and aspirates are unacceptable for Xpert Xpress SARS-CoV-2/FLU/RSV testing.  Fact Sheet for Patients: EntrepreneurPulse.com.au  Fact Sheet for Healthcare Providers: IncredibleEmployment.be  This test is not yet approved or cleared by the [place] FDA and has been authorized for detection and/or diagnosis of SARS-CoV-2 by FDA under an Emergency Use Authorization (EUA). This EUA will remain in effect (meaning this test can be used) for the duration of the COVID-19 declaration under Section 564(b)(1) of the Act, 21 U.S.C. section 360bbb-3(b)(1), unless the authorization is terminated or revoked.  Performed at [facility], [place] [address] [place], [place] [address]      Radiology  Studies: ECHOCARDIOGRAM LIMITED  Result Date: 07/02/2021    ECHOCARDIOGRAM LIMITED REPORT   Patient Name:   Bruce Hunter Date of Exam: 07/01/2021 Medical Rec #:  [id number]      Height:       70.0 in Accession #:    [id number]     Weight:       291.0 lb Date of Birth:  04/16/1976      BSA:          2.448 m Patient Age:    29 years       BP:           188/116 mmHg Patient Gender: M              HR:           72 bpm. Exam Location:  Inpatient Procedure: Limited Echo, Color Doppler and Cardiac Doppler Indications:     CHF  History:         Patient has prior history of Echocardiogram examinations, most                  recent 03/05/2021. Risk Factors:Hypertension, Diabetes and                  Dyslipidemia.  Sonographer:     Jyl Heinz Referring Phys:  [id number] Rex Kras Diagnosing Phys: Adrian Prows MD IMPRESSIONS  1. Left ventricular ejection fraction, by estimation, is 40 to 45%. Left ventricular ejection fraction by 2D MOD biplane is 47.2 %. The left ventricle has mildly decreased function. The left ventricle demonstrates global hypokinesis. The left ventricular internal cavity size was mildly dilated. There is mild concentric left ventricular hypertrophy. Left ventricular diastolic parameters are consistent with Grade II diastolic dysfunction (pseudonormalization). Elevated left ventricular end-diastolic pressure.  2. Right ventricular systolic function is normal. The right ventricular size is normal. There is mildly elevated pulmonary artery systolic pressure. The  estimated right ventricular systolic pressure is 36.6 mmHg.  3. The mitral valve is normal in structure. Trivial mitral valve regurgitation. No evidence of mitral stenosis.  4. The aortic valve is normal in structure. Aortic valve regurgitation is not visualized. No aortic stenosis is present.  5. The inferior vena cava is dilated in size with <50% respiratory variability, suggesting right atrial pressure of 15 mmHg. Conclusion(s)/Recommendation(s):  Findings consistent with non-ischemic cardiomyopathy. FINDINGS  Left Ventricle: Left ventricular ejection fraction, by estimation, is 40 to 45%. Left ventricular ejection fraction by 2D MOD biplane is 47.2 %. The left ventricle has mildly decreased function. The left ventricle demonstrates global hypokinesis. The left ventricular internal cavity size was mildly dilated. There is mild concentric left ventricular hypertrophy. Left ventricular diastolic parameters are consistent with Grade II diastolic dysfunction (pseudonormalization). Elevated left ventricular end-diastolic pressure. Right Ventricle: The right ventricular size is normal. No increase in right ventricular wall thickness. Right ventricular systolic function is normal. There is mildly elevated pulmonary artery systolic pressure. The tricuspid regurgitant velocity is 2.29  m/s, and with an assumed right atrial pressure of 15 mmHg, the estimated right ventricular systolic pressure is 44.0 mmHg. Left Atrium: Left atrial size was normal in size. Right Atrium: Right atrial size was normal in size. Pericardium: There is no evidence of pericardial effusion. Mitral Valve: The mitral valve is normal in structure. Trivial mitral valve regurgitation. No evidence of mitral valve stenosis. Tricuspid Valve: The tricuspid valve is normal in structure. Tricuspid valve regurgitation is trivial. No evidence of tricuspid stenosis. Aortic Valve: The aortic valve is normal in structure. Aortic valve regurgitation is not visualized. No aortic stenosis is present. Aortic valve peak gradient measures 3.8 mmHg. Pulmonic Valve: The pulmonic valve was normal in structure. Pulmonic valve regurgitation is trivial. No evidence of pulmonic stenosis. Aorta: The aortic root is normal in size and structure. Venous: The inferior vena cava is dilated in size with less than 50% respiratory variability, suggesting right atrial pressure of 15 mmHg. IAS/Shunts: No atrial level shunt detected  by color flow Doppler. LEFT VENTRICLE PLAX 2D                        Biplane EF (MOD) LVIDd:         5.90 cm         LV Biplane EF:   Left LVIDs:         4.50 cm                          ventricular LV PW:         1.20 cm                          ejection LV IVS:        1.20 cm                          fraction by LVOT diam:     2.40 cm                          2D MOD LV SV:         74                               biplane is  LV SV Index:   30                               47.2 %. LVOT Area:     4.52 cm                                Diastology                                LV e' medial:    5.44 cm/s LV Volumes (MOD)               LV E/e' medial:  13.4 LV vol d, MOD    198.0 ml      LV e' lateral:   5.22 cm/s A2C:                           LV E/e' lateral: 13.9 LV vol d, MOD    198.0 ml A4C: LV vol s, MOD    108.0 ml A2C: LV vol s, MOD    103.0 ml A4C: LV SV MOD A2C:   90.0 ml LV SV MOD A4C:   198.0 ml LV SV MOD BP:    94.8 ml RIGHT VENTRICLE             IVC RV S prime:     12.90 cm/s  IVC diam: 2.70 cm TAPSE (M-mode): 2.6 cm LEFT ATRIUM         Index LA diam:    4.20 cm 1.72 cm/m  AORTIC VALVE AV Area (Vmax): 3.98 cm AV Vmax:        98.05 cm/s AV Peak Grad:   3.8 mmHg LVOT Vmax:      86.25 cm/s LVOT Vmean:     64.300 cm/s LVOT VTI:       0.163 m  AORTA Ao Root diam: 3.20 cm Ao Asc diam:  3.30 cm MITRAL VALVE               TRICUSPID VALVE MV Area (PHT): 4.06 cm    TR Peak grad:   21.0 mmHg MV Decel Time: 187 msec    TR Vmax:        229.00 cm/s MV E velocity: 72.70 cm/s MV A velocity: 83.10 cm/s  SHUNTS MV E/A ratio:  0.87        Systemic VTI:  0.16 m                            Systemic Diam: 2.40 cm Adrian Prows MD Electronically signed by Adrian Prows MD Signature Date/Time: 07/02/2021/7:31:12 AM    Final      LOS: 2 days   Antonieta Pert, MD [facility]  07/02/2021, 11:24 AM

## 2021-07-02 NOTE — Progress Notes (Signed)
Progress Note  Patient Name: Bruce Hunter Date of Encounter: 07/02/2021  Attending physician: Antonieta Pert, MD Primary care provider: Elwyn Reach, MD Primary Cardiologist: Dr. Adrian Prows Consultant:Nahome Bublitz Terri Skains, DO  Subjective: Bruce Hunter is a 45 y.o. male who was seen and examined at bedside Shortness of breath improving. Lower extremity swelling bilateral.  Denies any chest pain. I's and O's have not been correctly documented despite talking to nursing staff.  Patient wanting to go home.   Objective: Vital Signs in the last 24 hours: Temp:  [97.8 F (36.6 C)-99 F (37.2 C)] 98.4 F (36.9 C) (10/28 1219) Pulse Rate:  [65-84] 77 (10/28 1222) Resp:  [16-19] 16 (10/28 1039) BP: (141-176)/(93-118) 141/101 (10/28 1222) SpO2:  [94 %-98 %] 94 % (10/28 1222) Weight:  [127.4 kg] 127.4 kg (10/28 0851)  Intake/Output:  Intake/Output Summary (Last 24 hours) at 07/02/2021 1252 Last data filed at 07/02/2021 1232 Gross per 24 hour  Intake 480 ml  Output 400 ml  Net 80 ml    Net IO Since Admission: 440 mL [07/02/21 1252]  Telemetry: Personally reviewed. NSR  Physical examination: PHYSICAL EXAM: Vitals with BMI 07/02/2021 07/02/2021 07/02/2021  Height - - -  Weight - - -  BMI - - -  Systolic 132 440 102  Diastolic 725 366 440  Pulse 77 79 65    CONSTITUTIONAL: Appears older than stated age.  Well-developed and well-nourished. No acute distress.  SKIN: Skin is warm and dry. No rash noted. No cyanosis. No pallor. No jaundice HEAD: Normocephalic and atraumatic.  EYES: No scleral icterus MOUTH/THROAT: Moist oral membranes.  NECK: Short neck stature making JVP evaluation difficult. No thyromegaly noted. No carotid bruits  LYMPHATIC: No visible cervical adenopathy.  CHEST Normal respiratory effort. No intercostal retractions  LUNGS: Minimal expiratory wheezes noted bilaterally.  No stridor.  No rales.  CARDIOVASCULAR: Regular, positive S1-S2, no murmurs rubs or  gallops appreciated. ABDOMINAL: Obese, soft, nontender, nondistended, positive bowel sounds all 4 quadrants. No apparent ascites.  EXTREMITIES: bilateral 2+ peripheral edema, warm to touch. HEMATOLOGIC: No significant bruising NEUROLOGIC: Oriented to person, place, and time. Nonfocal. Normal muscle tone.  PSYCHIATRIC: Normal mood and affect. Normal behavior. Cooperative  Lab Results: Hematology Recent Labs  Lab 06/29/21 1154 06/29/21 1912 06/30/21 0450  WBC 10.4  --  12.1*  RBC 3.92*  --  3.91*  HGB 11.6* 12.9* 11.7*  HCT 39.5 38.0* 38.2*  MCV 100.8*  --  97.7  MCH 29.6  --  29.9  MCHC 29.4*  --  30.6  RDW 18.2*  --  18.2*  PLT 253  --  242    Chemistry Recent Labs  Lab 06/29/21 1154 06/29/21 1912 06/30/21 0450 07/01/21 0236 07/02/21 0204  NA 136   < > 135 136 136  K 4.6   < > 4.7 4.3 4.0  CL 97*  --  100 98 100  CO2 25  --  26 28 27   GLUCOSE 341*  --  293* 222* 192*  BUN 32*  --  36* 43* 45*  CREATININE 1.85*  --  1.99* 2.28* 2.11*  CALCIUM 8.7*  --  8.8* 9.1 9.0  PROT 6.8  --   --   --   --   ALBUMIN 3.6  --   --   --   --   AST 143*  --   --   --   --   ALT 111*  --   --   --   --  ALKPHOS 81  --   --   --   --   BILITOT 0.5  --   --   --   --   GFRNONAA 45*  --  41* 35* 39*  ANIONGAP 14  --  9 10 9    < > = values in this interval not displayed.     Cardiac Enzymes: Cardiac Panel (last 3 results) No results for input(s): CKTOTAL, CKMB, TROPONINIHS, RELINDX in the last 72 hours.  BNP (last 3 results) Recent Labs    03/04/21 1814 06/29/21 1850  BNP 837.4* 151.4*    ProBNP (last 3 results) Recent Labs    06/07/21 1627 06/24/21 1217  PROBNP 1,038* 1,121*     DDimer No results for input(s): DDIMER in the last 168 hours.   Hemoglobin A1c:  Lab Results  Component Value Date   HGBA1C 6.6 (H) 06/30/2021   MPG 142.72 06/30/2021    TSH  Recent Labs    02/12/21 0322  TSH 0.430    Lipid Panel     Component Value Date/Time   CHOL 219  (H) 07/01/2021 0236   TRIG 113 07/01/2021 0236   HDL 67 07/01/2021 0236   CHOLHDL 3.3 07/01/2021 0236   VLDL 23 07/01/2021 0236   LDLCALC 129 (H) 07/01/2021 0236    Imaging: ECHOCARDIOGRAM LIMITED  Result Date: 07/02/2021    ECHOCARDIOGRAM LIMITED REPORT   Patient Name:   Bruce Hunter Date of Exam: 07/01/2021 Medical Rec #:  [id number]      Height:       70.0 in Accession #:    [id number]     Weight:       291.0 lb Date of Birth:  [birth year]      BSA:          2.448 m Patient Age:    9 years       BP:           188/116 mmHg Patient Gender: M              HR:           72 bpm. Exam Location:  Inpatient Procedure: Limited Echo, Color Doppler and Cardiac Doppler Indications:     CHF  History:         Patient has prior history of Echocardiogram examinations, most                  recent 03/05/2021. Risk Factors:Hypertension, Diabetes and                  Dyslipidemia.  Sonographer:     Jyl Heinz Referring Phys:  [id number] Rex Kras Diagnosing Phys: Adrian Prows MD IMPRESSIONS  1. Left ventricular ejection fraction, by estimation, is 40 to 45%. Left ventricular ejection fraction by 2D MOD biplane is 47.2 %. The left ventricle has mildly decreased function. The left ventricle demonstrates global hypokinesis. The left ventricular internal cavity size was mildly dilated. There is mild concentric left ventricular hypertrophy. Left ventricular diastolic parameters are consistent with Grade II diastolic dysfunction (pseudonormalization). Elevated left ventricular end-diastolic pressure.  2. Right ventricular systolic function is normal. The right ventricular size is normal. There is mildly elevated pulmonary artery systolic pressure. The estimated right ventricular systolic pressure is 03.4 mmHg.  3. The mitral valve is normal in structure. Trivial mitral valve regurgitation. No evidence of mitral stenosis.  4. The aortic valve is normal in structure. Aortic valve regurgitation is not visualized. No aortic stenosis  is present.  5. The inferior vena cava is dilated in size with <50% respiratory variability, suggesting right atrial pressure of 15 mmHg. Conclusion(s)/Recommendation(s): Findings consistent with non-ischemic cardiomyopathy. FINDINGS  Left Ventricle: Left ventricular ejection fraction, by estimation, is 40 to 45%. Left ventricular ejection fraction by 2D MOD biplane is 47.2 %. The left ventricle has mildly decreased function. The left ventricle demonstrates global hypokinesis. The left ventricular internal cavity size was mildly dilated. There is mild concentric left ventricular hypertrophy. Left ventricular diastolic parameters are consistent with Grade II diastolic dysfunction (pseudonormalization). Elevated left ventricular end-diastolic pressure. Right Ventricle: The right ventricular size is normal. No increase in right ventricular wall thickness. Right ventricular systolic function is normal. There is mildly elevated pulmonary artery systolic pressure. The tricuspid regurgitant velocity is 2.29  m/s, and with an assumed right atrial pressure of 15 mmHg, the estimated right ventricular systolic pressure is 17.5 mmHg. Left Atrium: Left atrial size was normal in size. Right Atrium: Right atrial size was normal in size. Pericardium: There is no evidence of pericardial effusion. Mitral Valve: The mitral valve is normal in structure. Trivial mitral valve regurgitation. No evidence of mitral valve stenosis. Tricuspid Valve: The tricuspid valve is normal in structure. Tricuspid valve regurgitation is trivial. No evidence of tricuspid stenosis. Aortic Valve: The aortic valve is normal in structure. Aortic valve regurgitation is not visualized. No aortic stenosis is present. Aortic valve peak gradient measures 3.8 mmHg. Pulmonic Valve: The pulmonic valve was normal in structure. Pulmonic valve regurgitation is trivial. No evidence of pulmonic stenosis. Aorta: The aortic root is normal in size and structure. Venous: The  inferior vena cava is dilated in size with less than 50% respiratory variability, suggesting right atrial pressure of 15 mmHg. IAS/Shunts: No atrial level shunt detected by color flow Doppler. LEFT VENTRICLE PLAX 2D                        Biplane EF (MOD) LVIDd:         5.90 cm         LV Biplane EF:   Left LVIDs:         4.50 cm                          ventricular LV PW:         1.20 cm                          ejection LV IVS:        1.20 cm                          fraction by LVOT diam:     2.40 cm                          2D MOD LV SV:         74                               biplane is LV SV Index:   30                               47.2 %. LVOT Area:     4.52  cm                                Diastology                                LV e' medial:    5.44 cm/s LV Volumes (MOD)               LV E/e' medial:  13.4 LV vol d, MOD    198.0 ml      LV e' lateral:   5.22 cm/s A2C:                           LV E/e' lateral: 13.9 LV vol d, MOD    198.0 ml A4C: LV vol s, MOD    108.0 ml A2C: LV vol s, MOD    103.0 ml A4C: LV SV MOD A2C:   90.0 ml LV SV MOD A4C:   198.0 ml LV SV MOD BP:    94.8 ml RIGHT VENTRICLE             IVC RV S prime:     12.90 cm/s  IVC diam: 2.70 cm TAPSE (M-mode): 2.6 cm LEFT ATRIUM         Index LA diam:    4.20 cm 1.72 cm/m  AORTIC VALVE AV Area (Vmax): 3.98 cm AV Vmax:        98.05 cm/s AV Peak Grad:   3.8 mmHg LVOT Vmax:      86.25 cm/s LVOT Vmean:     64.300 cm/s LVOT VTI:       0.163 m  AORTA Ao Root diam: 3.20 cm Ao Asc diam:  3.30 cm MITRAL VALVE               TRICUSPID VALVE MV Area (PHT): 4.06 cm    TR Peak grad:   21.0 mmHg MV Decel Time: 187 msec    TR Vmax:        229.00 cm/s MV E velocity: 72.70 cm/s MV A velocity: 83.10 cm/s  SHUNTS MV E/A ratio:  0.87        Systemic VTI:  0.16 m                            Systemic Diam: 2.40 cm Adrian Prows MD Electronically signed by Adrian Prows MD Signature Date/Time: 07/02/2021/7:31:12 AM    Final     Cardiac database: EKG: EKG  06/29/2021: Sinus tachycardia at rate of 101 bpm, rightward axis, incomplete right bundle branch block.  Poor R wave progression, cannot exclude anteroseptal infarct old.  No evidence of ischemia.  No significant change from 03/19/2021.  Echocardiogram: 03/05/2021:    1. Left ventricular ejection fraction, by estimation, is 60 to 65%. The left ventricle has normal function. The left ventricle has no regional wall motion abnormalities. Left ventricular diastolic parameters were normal.  2. Right ventricular systolic function is low normal. The right ventricular size is normal. There is mildly elevated pulmonary artery systolic pressure.  3. Right atrial size was mildly dilated.  4. The mitral valve is normal in structure. No evidence of mitral valve regurgitation.  5. Tricuspid valve regurgitation is mild to moderate.  6. The aortic valve is normal in structure. Aortic valve regurgitation is  not visualized.  07/01/2021:  1. Left ventricular ejection fraction, by estimation, is 40 to 45%. Left  ventricular ejection fraction by 2D MOD biplane is 47.2 %. The left  ventricle has mildly decreased function. The left ventricle demonstrates  global hypokinesis. The left  ventricular internal cavity size was mildly dilated. There is mild  concentric left ventricular hypertrophy. Left ventricular diastolic  parameters are consistent with Grade II diastolic dysfunction  (pseudonormalization). Elevated left ventricular  end-diastolic pressure.   2. Right ventricular systolic function is normal. The right ventricular  size is normal. There is mildly elevated pulmonary artery systolic  pressure. The estimated right ventricular systolic pressure is 94.4 mmHg.   3. The mitral valve is normal in structure. Trivial mitral valve  regurgitation. No evidence of mitral stenosis.   4. The aortic valve is normal in structure. Aortic valve regurgitation is  not visualized. No aortic stenosis is present.   5. The  inferior vena cava is dilated in size with <50% respiratory  variability, suggesting right atrial pressure of 15 mmHg.   CT angiogram chest 04/28/2021: 1. No CT evidence of pulmonary embolism. 2. Streaky area of airspace density extending from the right hilum into the right upper lobe may represent atelectasis. Atypical infection is not excluded. Clinical correlation and follow-up recommended. 3. Colonic diverticulosis. Mild pericolonic haziness adjacent to the ascending colon may be chronic or represent mild acute diverticulitis. Clinical correlation is recommended. No diverticular abscess or perforation. 4. Cholelithiasis. 5. Cardiovascular: Mild cardiomegaly. No pericardial effusion. Thoracic aorta is unremarkable.  Aortic Atherosclerosis-mild aortoiliac atherosclerotic plaque.   Scheduled Meds:  aspirin EC  81 mg Oral Daily   atorvastatin  10 mg Oral Daily   dapagliflozin propanediol  10 mg Oral Daily   dolutegravir  50 mg Oral Daily   And   lamiVUDine  300 mg Oral Daily   doxycycline  100 mg Oral Q12H   enoxaparin (LOVENOX) injection  0.5 mg/kg Subcutaneous Q24H   famotidine  40 mg Oral Daily   fluticasone furoate-vilanterol  1 puff Inhalation Daily   gabapentin  600 mg Oral TID   insulin aspart  0-15 Units Subcutaneous TID WC   insulin aspart  0-5 Units Subcutaneous QHS   insulin glargine-yfgn  5 Units Subcutaneous Daily   ipratropium-albuterol  3 mL Nebulization BID   isosorbide-hydrALAZINE  2 tablet Oral TID   labetalol  100 mg Oral BID   magnesium oxide  400 mg Oral BID   [START ON 07/03/2021] predniSONE  20 mg Oral Q breakfast   traZODone  50 mg Oral QHS    Continuous Infusions:   PRN Meds: acetaminophen **OR** acetaminophen, clonazePAM, methocarbamol, metoprolol tartrate, ondansetron **OR** ondansetron (ZOFRAN) IV, oxyCODONE, senna-docusate   IMPRESSION & RECOMMENDATIONS: Bruce Hunter is a 45 y.o. male whose past medical history and cardiac risk factors  include: HFpEF, HIV, hypertension, COPD, DMT2 with neuropathy, obesity, CKD 3, gout, CHF, tobacco use   A/C systolic and diastolic heart failure, stage C, NYHA class II/III: Volume overload most likely secondary to him acute exacerbation of HFpEF, COPD, chronic steroid use, and medication/dietary noncompliance. Strict I's and O's and daily weights - not done, orders placed and spoke to nursing staff again Limited echo ordered to re-evaluate LVEF. LVEF is now 40-45% w/ G2DD compared to LVEF 60-65% in July 22.  Hold lasix for now given renal function.  Will start Wilder Glade - will need close monitoring of renal function.  Currently Avapro 150 mg p.o. daily held due to  AKI.  Compression stockings thigh-high. Increase Bidil to 20/37.5mg  2 tabs bid  Change Metoprolol to Labetalol.   Cardiomyopathy: Suspect non-ischemic  No chest pain or angina pectoris Consider stress test as outpatient. Would not recommend heart cath in setting AKI and no angina.  RV function and size reported as normal and mild PHTN.  Monitor for now.   Acute kidney injury on chronic kidney disease: Most likely secondary to diuresis. Hold Lasix and ARB for now.  Monitor BUN and creatinine Strict I's and O's and daily weights.  Diabetes mellitus with stage III chronic kidney disease and hyperglycemia:  Currently managed per primary team  Hypertension with chronic kidney disease stage III: Not well controlled. Increase BiDil as discussed above. Follow blood pressures.  HIV: Currently being followed by infectious disease.  On antiretroviral medications.  Aortic atherosclerosis:  Aspirin 81 mg p.o. daily Atorvastatin 10 mg p.o. nightly  Smoking cigarettes: Educated on the importance of complete smoking cessation.  Patient's questions and concerns were addressed to his satisfaction. He voices understanding of the instructions provided during this encounter.   This note was created using a voice recognition  software as a result there may be grammatical errors inadvertently enclosed that do not reflect the nature of this encounter. Every attempt is made to correct such errors.  Rex Kras, DO, [facility]. [place] Office: [phone] 07/02/2021, 12:52 PM

## 2021-07-02 NOTE — Progress Notes (Signed)
CSW met with pt to complete readmission risk items.  Pt confirmed PCP with Dr Garba. Pt has own vehicle for transportation to appts. Greg , MSW, LCSW 10/28/202212:29 PM  

## 2021-07-03 DIAGNOSIS — N183 Chronic kidney disease, stage 3 unspecified: Secondary | ICD-10-CM

## 2021-07-03 DIAGNOSIS — I5043 Acute on chronic combined systolic (congestive) and diastolic (congestive) heart failure: Secondary | ICD-10-CM | POA: Diagnosis not present

## 2021-07-03 DIAGNOSIS — I130 Hypertensive heart and chronic kidney disease with heart failure and stage 1 through stage 4 chronic kidney disease, or unspecified chronic kidney disease: Secondary | ICD-10-CM

## 2021-07-03 LAB — BASIC METABOLIC PANEL
Anion gap: 11 (ref 5–15)
BUN: 35 mg/dL — ABNORMAL HIGH (ref 6–20)
CO2: 26 mmol/L (ref 22–32)
Calcium: 9 mg/dL (ref 8.9–10.3)
Chloride: 101 mmol/L (ref 98–111)
Creatinine, Ser: 1.84 mg/dL — ABNORMAL HIGH (ref 0.61–1.24)
GFR, Estimated: 46 mL/min — ABNORMAL LOW (ref >60)
Glucose, Bld: 196 mg/dL — ABNORMAL HIGH (ref 70–99)
Potassium: 3.8 mmol/L (ref 3.5–5.1)
Sodium: 138 mmol/L (ref 135–145)

## 2021-07-03 LAB — CBC
HCT: 37 % — ABNORMAL LOW (ref 39.0–52.0)
Hemoglobin: 11.6 g/dL — ABNORMAL LOW (ref 13.0–17.0)
MCH: 30.1 pg (ref 26.0–34.0)
MCHC: 31.4 g/dL (ref 30.0–36.0)
MCV: 96.1 fL (ref 80.0–100.0)
Platelets: 216 10*3/uL (ref 150–400)
RBC: 3.85 MIL/uL — ABNORMAL LOW (ref 4.22–5.81)
RDW: 18.3 % — ABNORMAL HIGH (ref 11.5–15.5)
WBC: 12.6 10*3/uL — ABNORMAL HIGH (ref 4.0–10.5)
nRBC: 0 % (ref 0.0–0.2)

## 2021-07-03 LAB — GLUCOSE, CAPILLARY
Glucose-Capillary: 176 mg/dL — ABNORMAL HIGH (ref 70–99)
Glucose-Capillary: 205 mg/dL — ABNORMAL HIGH (ref 70–99)
Glucose-Capillary: 203 mg/dL — ABNORMAL HIGH (ref 70–99)

## 2021-07-03 MED ORDER — ENOXAPARIN SODIUM 60 MG/0.6ML IJ SOSY
60.0000 mg | PREFILLED_SYRINGE | INTRAMUSCULAR | Status: DC
  Administered 2021-07-03: 60 mg via SUBCUTANEOUS
  Filled 2021-07-03: qty 0.6

## 2021-07-03 MED ORDER — MUSCLE RUB 10-15 % EX CREA
TOPICAL_CREAM | CUTANEOUS | Status: DC | PRN
  Filled 2021-07-03: qty 85

## 2021-07-03 NOTE — Progress Notes (Signed)
PROGRESS NOTE    Bruce Hunter  HYW:[id number] DOB: 1975-10-29 DOA: 06/29/2021 PCP: Elwyn Reach, MD   Chief Complaint  Patient presents with   Shortness of Breath  Brief Narrative/Hospital Course:  Bruce Hunter, 45 y.o. male with PMH of HIV/ARDS, HTN, COPD diabetes mellitus type 2 with neuropathy, obesity, CKD stage III, gout, CHF, tobacco use presents with complaints of shortness of breath and wheezing.  He was admitted few weeks ago with CHF exacerbation and respiratory failure that required intubation and mechanical ventilation and also tracheostomy.  At that time he was treated with PCP pneumonia and developed renal failure due to septic shock that has resolved.  He was started on prednisone at that time for gout and has been continued on that. In the ED patient received multiple DuoNeb treatments for COPD exacerbation chest x-ray showed bibasilar atelectasis but no acute infiltrate or consolidation. Patient is being treated for COPD exacerbation hypertension urgency acute diastolic heart failure along with AKI on CKD.  Subjective: Seen and examined this morning.  He reports his swelling is better, he has compression stocking in place.  No new complaints.    Assessment & Plan:  Acute on chronic combined systolic and diastolic CHF exacerbation: LVEF 40-45% with G2 DD proBNP on admission 1121, previous was 1038 on 10/3.  Appreciate cardiology input holding Lasix due to his renal dysfunction, apply compression stocking for his lower leg edema.  Meds has been adjusted continue Farxiga, BiDil Metropol changed to labetalol .  If renal function improves further initiate Entresto.  Will need close follow-up with cardiology as outpatient.  Weight in addition to 91 pound now down to 271 pound .monitor intake output Daily weight  electrolytes, Cont salt and fluid restricted diet.  Previous weight in August was 92.9 kg. Filed Weights   06/29/21 1143 07/02/21 0851 07/03/21 0425  Weight: 132  kg 127.4 kg 123.2 kg   Net IO Since Admission: -2,470 mL [07/03/21 1243]   Acute COPD exacerbation Moderate persistent asthma with acute exacerbation: Respiratory status improved, he is not wheezing.  Continue his prednisone taper for his gout.  Continue  his doxycycline Breo Ellipta.Monitor.  HIV/AIDS: ID is following, reasonably controlled on Dovato plus of Bactrim per PCP prevention.  T2DM with diabetic neuropathy: A1c stable 6.610/26.  Well-controlled, cont Lantus 5 units and sliding scale insulin. Recent Labs  Lab 07/02/21 0756 07/02/21 1222 07/02/21 1548 07/02/21 2142 07/03/21 1216  GLUCAP 174* 211* 242* 173* 176*     Essential hypertension/Hypertensive urgency:  HLD: BP is is now better controlled after adjusting meds for CHF.  Continue aspirin, Lipitor   AKI on CKD stage IIIa baseline creat 1.4 in august 2022.  Discussed with cardiology continue to hold off on his Lasix since we are adjusting other cardiac medication-renal function downtrending.  Monitoring function closely Recent Labs  Lab 06/29/21 1154 06/30/21 0450 07/01/21 0236 07/02/21 0204 07/03/21 0415  BUN 32* 36* 43* 45* 35*  CREATININE 1.85* 1.99* 2.28* 2.11* 1.84*    On steroids for more than 3 months is being tapered down, completing History of gout flare has been on prednisone for 3 months taper with 20 mg x 1 week then 10 mg x 1 week then stop patient has a prescription sent continue 20 mg through 11/2 then 10 mg through 11/9.   Class III Obesity:Patient's Body mass index is 38.96 kg/m. : Will benefit with PCP follow-up, weight loss  healthy lifestyle and outpatient sleep evaluation.  DVT prophylaxis: Place TED  hose Start: 07/01/21 9629 Code Status:   Code Status: Full Code Family Communication: plan of care discussed with patient at bedside. Status is: Inpatient  Remains inpatient appropriate because: For ongoing diuresis for his congestive heart failure, monitoring of renal function. Currently  not medically stable. Anticipate discharge home tomorrow if renal function improves  Objective: Vitals last 24 hrs: Vitals:   07/02/21 2052 07/03/21 0425 07/03/21 0843 07/03/21 0920  BP: (!) 171/108 (!) 153/98  (!) 164/96  Pulse: 89 76    Resp: 20     Temp: 98.7 F (37.1 C) 98.4 F (36.9 C)  98.6 F (37 C)  TempSrc: Oral Oral  Oral  SpO2: 94% 94% 97% 95%  Weight:  123.2 kg    Height:       Weight change:   Intake/Output Summary (Last 24 hours) at 07/03/2021 1243 Last data filed at 07/03/2021 5284 Gross per 24 hour  Intake 940 ml  Output 3850 ml  Net -2910 ml    Net IO Since Admission: -2,470 mL [07/03/21 1243]   Physical Examination: General exam: AAOx 3,older than stated age, weak appearing. HEENT:Oral mucosa moist, Ear/Nose WNL grossly, dentition normal. Respiratory system: bilaterally clear, no use of accessory muscle Cardiovascular system: S1 & S2 +, No JVD,. Gastrointestinal system: Abdomen soft, NT,ND, BS+ Nervous System:Alert, awake, moving extremities and grossly nonfocal Extremities: b/l pitting edema, distal peripheral pulses palpable.  Skin: No rashes,no icterus. MSK: Normal muscle bulk,tone, power   Medications reviewed:  Scheduled Meds:  aspirin EC  81 mg Oral Daily   atorvastatin  10 mg Oral Daily   dapagliflozin propanediol  10 mg Oral Daily   dolutegravir  50 mg Oral Daily   And   lamiVUDine  300 mg Oral Daily   doxycycline  100 mg Oral Q12H   enoxaparin (LOVENOX) injection  0.5 mg/kg Subcutaneous Q24H   famotidine  40 mg Oral Daily   fluticasone furoate-vilanterol  1 puff Inhalation Daily   gabapentin  600 mg Oral TID   insulin aspart  0-15 Units Subcutaneous TID WC   insulin aspart  0-5 Units Subcutaneous QHS   insulin glargine-yfgn  5 Units Subcutaneous Daily   ipratropium-albuterol  3 mL Nebulization BID   isosorbide-hydrALAZINE  2 tablet Oral TID   labetalol  100 mg Oral BID   magnesium oxide  400 mg Oral BID   predniSONE  20 mg Oral  Q breakfast   traZODone  50 mg Oral QHS   Continuous Infusions:  Diet Order             Diet Heart Room service appropriate? Yes; Fluid consistency: Thin  Diet effective now                 Weight change:   Wt Readings from Last 3 Encounters:  07/03/21 123.2 kg  06/24/21 132 kg  04/27/21 92.9 kg  Consultants:see note  Procedures:see note Antimicrobials: Anti-infectives (From admission, onward)    Start     Dose/Rate Route Frequency Ordered Stop   06/30/21 1000  dolutegravir-lamiVUDine (DOVATO) 50-300 MG per tablet 1 tablet  Status:  Discontinued        1 tablet Oral Daily 06/29/21 2229 06/30/21 0246   06/30/21 1000  dolutegravir (TIVICAY) tablet 50 mg       See Hyperspace for full Linked Orders Report.   50 mg Oral Daily 06/30/21 0246     06/30/21 1000  lamiVUDine (EPIVIR) tablet 300 mg       See Hyperspace  for full Linked Orders Report.   300 mg Oral Daily 06/30/21 0246     06/29/21 2230  doxycycline (VIBRA-TABS) tablet 100 mg        100 mg Oral Every 12 hours 06/29/21 2229        Culture/Microbiology    Component Value Date/Time   SDES SYNOVIAL FLUID 04/22/2021 2050   [place] RT ELBOW 04/22/2021 2050   CULT  04/22/2021 2050    NO GROWTH 3 DAYS Performed at [facility], [place] [address] [place], [place] [address]    REPTSTATUS 04/26/2021 FINAL 04/22/2021 2050    Other culture-see note  Unresulted Labs (From admission, onward)     Start     Ordered   07/03/21 2440  Basic metabolic panel  Daily,   R     Question:  Specimen collection method  Answer:  Lab=Lab collect   07/02/21 1212   07/03/21 0500  CBC  Daily,   R     Question:  Specimen collection method  Answer:  Lab=Lab collect   07/02/21 1212          Data Reviewed: I have personally reviewed following labs and imaging studies CBC: Recent Labs  Lab 06/29/21 1154 06/29/21 1912 06/30/21 0450 07/03/21 0415  WBC 10.4  --  12.1* 12.6*  HGB 11.6* 12.9* 11.7* 11.6*  HCT 39.5 38.0*  38.2* 37.0*  MCV 100.8*  --  97.7 96.1  PLT 253  --  242 102    Basic Metabolic Panel: Recent Labs  Lab 06/29/21 1154 06/29/21 1912 06/30/21 0450 07/01/21 0236 07/02/21 0204 07/03/21 0415  NA 136 139 135 136 136 138  K 4.6 5.5* 4.7 4.3 4.0 3.8  CL 97*  --  100 98 100 101  CO2 25  --  26 28 27 26   GLUCOSE 341*  --  293* 222* 192* 196*  BUN 32*  --  36* 43* 45* 35*  CREATININE 1.85*  --  1.99* 2.28* 2.11* 1.84*  CALCIUM 8.7*  --  8.8* 9.1 9.0 9.0    GFR: Estimated Creatinine Clearance: 66.8 mL/min (A) (by C-G formula based on SCr of 1.84 mg/dL (H)). Liver Function Tests: Recent Labs  Lab 06/29/21 1154  AST 143*  ALT 111*  ALKPHOS 81  BILITOT 0.5  PROT 6.8  ALBUMIN 3.6    No results for input(s): LIPASE, AMYLASE in the last 168 hours. No results for input(s): AMMONIA in the last 168 hours. Coagulation Profile: No results for input(s): INR, PROTIME in the last 168 hours. Cardiac Enzymes: No results for input(s): CKTOTAL, CKMB, CKMBINDEX, TROPONINI in the last 168 hours. BNP (last 3 results) Recent Labs    06/07/21 1627 06/24/21 1217  PROBNP 1,038* 1,121*    HbA1C: No results for input(s): HGBA1C in the last 72 hours.  CBG: Recent Labs  Lab 07/02/21 0756 07/02/21 1222 07/02/21 1548 07/02/21 2142 07/03/21 1216  GLUCAP 174* 211* 242* 173* 176*    Lipid Profile: Recent Labs    07/01/21 0236  CHOL 219*  HDL 67  LDLCALC 129*  TRIG 113  CHOLHDL 3.3    Thyroid Function Tests: No results for input(s): TSH, T4TOTAL, FREET4, T3FREE, THYROIDAB in the last 72 hours. Anemia Panel: No results for input(s): VITAMINB12, FOLATE, FERRITIN, TIBC, IRON, RETICCTPCT in the last 72 hours. Sepsis Labs: No results for input(s): PROCALCITON, LATICACIDVEN in the last 168 hours.  Recent Results (from the past 240 hour(s))  Resp Panel by RT-PCR (Flu A&B, Covid) Nasopharyngeal Swab  Status: None   Collection Time: 06/29/21  6:34 PM   Specimen: Nasopharyngeal  Swab; Nasopharyngeal(NP) swabs in vial transport medium  Result Value Ref Range Status   SARS Coronavirus 2 by RT PCR NEGATIVE NEGATIVE Final    Comment: (NOTE) SARS-CoV-2 target nucleic acids are NOT DETECTED.  The SARS-CoV-2 RNA is generally detectable in upper respiratory specimens during the acute phase of infection. The lowest concentration of SARS-CoV-2 viral copies this assay can detect is 138 copies/mL. A negative result does not preclude SARS-Cov-2 infection and should not be used as the sole basis for treatment or other patient management decisions. A negative result may occur with  improper specimen collection/handling, submission of specimen other than nasopharyngeal swab, presence of viral mutation(s) within the areas targeted by this assay, and inadequate number of viral copies(<138 copies/mL). A negative result must be combined with clinical observations, patient history, and epidemiological information. The expected result is Negative.  Fact Sheet for Patients:  EntrepreneurPulse.com.au  Fact Sheet for Healthcare Providers:  IncredibleEmployment.be  This test is no t yet approved or cleared by the [place] FDA and  has been authorized for detection and/or diagnosis of SARS-CoV-2 by FDA under an Emergency Use Authorization (EUA). This EUA will remain  in effect (meaning this test can be used) for the duration of the COVID-19 declaration under Section 564(b)(1) of the Act, 21 U.S.C.section 360bbb-3(b)(1), unless the authorization is terminated  or revoked sooner.       Influenza A by PCR NEGATIVE NEGATIVE Final   Influenza B by PCR NEGATIVE NEGATIVE Final    Comment: (NOTE) The Xpert Xpress SARS-CoV-2/FLU/RSV plus assay is intended as an aid in the diagnosis of influenza from Nasopharyngeal swab specimens and should not be used as a sole basis for treatment. Nasal washings and aspirates are unacceptable for Xpert Xpress  SARS-CoV-2/FLU/RSV testing.  Fact Sheet for Patients: EntrepreneurPulse.com.au  Fact Sheet for Healthcare Providers: IncredibleEmployment.be  This test is not yet approved or cleared by the [place] FDA and has been authorized for detection and/or diagnosis of SARS-CoV-2 by FDA under an Emergency Use Authorization (EUA). This EUA will remain in effect (meaning this test can be used) for the duration of the COVID-19 declaration under Section 564(b)(1) of the Act, 21 U.S.C. section 360bbb-3(b)(1), unless the authorization is terminated or revoked.  Performed at [facility], [place] [address] [place], [place] [address]       Radiology Studies: No results found.   LOS: 3 days   Antonieta Pert, MD [facility]  07/03/2021, 12:43 PM

## 2021-07-03 NOTE — Plan of Care (Signed)

## 2021-07-03 NOTE — Progress Notes (Signed)
Progress Note  Patient Name: Bruce Hunter Date of Encounter: 07/03/2021  Attending physician: Antonieta Pert, MD Primary care provider: Elwyn Reach, MD Primary Cardiologist: Dr. Adrian Prows Consultant:Maddyn Lieurance Terri Skains, DO  Subjective: Bruce Hunter is a 45 y.o. male who was seen and examined at bedside Denies any chest pain or shortness of breath at rest or with effort related activities. Since wearing compression stockings patient's lower extremity swelling is improved and he is quite pleased with the progress. Patient states that prior to coming to the ED his weight was 291 pounds and today he weighs 271 pounds. Strict I's and O's are not accurate. Telemetry reviewed.  Objective: Vital Signs in the last 24 hours: Temp:  [98.4 F (36.9 C)-98.7 F (37.1 C)] 98.6 F (37 C) (10/29 0920) Pulse Rate:  [76-89] 76 (10/29 0425) Resp:  [20] 20 (10/28 2052) BP: (141-172)/(96-108) 164/96 (10/29 0920) SpO2:  [94 %-97 %] 95 % (10/29 0920) Weight:  [123.2 kg] 123.2 kg (10/29 0425)  Intake/Output:  Intake/Output Summary (Last 24 hours) at 07/03/2021 1123 Last data filed at 07/03/2021 0821 Gross per 24 hour  Intake 1180 ml  Output 3850 ml  Net -2670 ml    Net IO Since Admission: -2,470 mL [07/03/21 1123]  Telemetry: Personally reviewed. NSR  Physical examination: PHYSICAL EXAM: Vitals with BMI 07/03/2021 07/03/2021 07/02/2021  Height - - -  Weight - 271 lbs 8 oz -  BMI - 71.06 -  Systolic 269 485 462  Diastolic 96 98 703  Pulse - 76 89    CONSTITUTIONAL: Appears older than stated age.  Well-developed and well-nourished. No acute distress.  SKIN: Skin is warm and dry. No rash noted. No cyanosis. No pallor. No jaundice HEAD: Normocephalic and atraumatic.  EYES: No scleral icterus MOUTH/THROAT: Moist oral membranes.  NECK: Short neck stature making JVP evaluation difficult. No thyromegaly noted. No carotid bruits  LYMPHATIC: No visible cervical adenopathy.  CHEST Normal  respiratory effort. No intercostal retractions  LUNGS: Clear to auscultation bilaterally.  No stridor.  No rales.  CARDIOVASCULAR: Regular, positive S1-S2, no murmurs rubs or gallops appreciated. ABDOMINAL: Obese, soft, nontender, nondistended, positive bowel sounds all 4 quadrants. No apparent ascites.  EXTREMITIES: bilateral 1+ peripheral edema, warm to touch. HEMATOLOGIC: No significant bruising NEUROLOGIC: Oriented to person, place, and time. Nonfocal. Normal muscle tone.  PSYCHIATRIC: Normal mood and affect. Normal behavior. Cooperative  Lab Results: Hematology Recent Labs  Lab 06/29/21 1154 06/29/21 1912 06/30/21 0450 07/03/21 0415  WBC 10.4  --  12.1* 12.6*  RBC 3.92*  --  3.91* 3.85*  HGB 11.6* 12.9* 11.7* 11.6*  HCT 39.5 38.0* 38.2* 37.0*  MCV 100.8*  --  97.7 96.1  MCH 29.6  --  29.9 30.1  MCHC 29.4*  --  30.6 31.4  RDW 18.2*  --  18.2* 18.3*  PLT 253  --  242 216    Chemistry Recent Labs  Lab 06/29/21 1154 06/29/21 1912 07/01/21 0236 07/02/21 0204 07/03/21 0415  NA 136   < > 136 136 138  K 4.6   < > 4.3 4.0 3.8  CL 97*   < > 98 100 101  CO2 25   < > 28 27 26   GLUCOSE 341*   < > 222* 192* 196*  BUN 32*   < > 43* 45* 35*  CREATININE 1.85*   < > 2.28* 2.11* 1.84*  CALCIUM 8.7*   < > 9.1 9.0 9.0  PROT 6.8  --   --   --   --  ALBUMIN 3.6  --   --   --   --   AST 143*  --   --   --   --   ALT 111*  --   --   --   --   ALKPHOS 81  --   --   --   --   BILITOT 0.5  --   --   --   --   GFRNONAA 45*   < > 35* 39* 46*  ANIONGAP 14   < > 10 9 11    < > = values in this interval not displayed.     Cardiac Enzymes: Cardiac Panel (last 3 results) No results for input(s): CKTOTAL, CKMB, TROPONINIHS, RELINDX in the last 72 hours.  BNP (last 3 results) Recent Labs    03/04/21 1814 06/29/21 1850  BNP 837.4* 151.4*    ProBNP (last 3 results) Recent Labs    06/07/21 1627 06/24/21 1217  PROBNP 1,038* 1,121*     DDimer No results for input(s): DDIMER in  the last 168 hours.   Hemoglobin A1c:  Lab Results  Component Value Date   HGBA1C 6.6 (H) 06/30/2021   MPG 142.72 06/30/2021    TSH  Recent Labs    02/12/21 0322  TSH 0.430    Lipid Panel     Component Value Date/Time   CHOL 219 (H) 07/01/2021 0236   TRIG 113 07/01/2021 0236   HDL 67 07/01/2021 0236   CHOLHDL 3.3 07/01/2021 0236   VLDL 23 07/01/2021 0236   LDLCALC 129 (H) 07/01/2021 0236    Imaging: ECHOCARDIOGRAM LIMITED  Result Date: 07/02/2021    ECHOCARDIOGRAM LIMITED REPORT   Patient Name:   Bruce Hunter Date of Exam: 07/01/2021 Medical Rec #:  [id number]      Height:       70.0 in Accession #:    [id number]     Weight:       291.0 lb Date of Birth:  16-Aug-1976      BSA:          2.448 m Patient Age:    79 years       BP:           188/116 mmHg Patient Gender: M              HR:           72 bpm. Exam Location:  Inpatient Procedure: Limited Echo, Color Doppler and Cardiac Doppler Indications:     CHF  History:         Patient has prior history of Echocardiogram examinations, most                  recent 03/05/2021. Risk Factors:Hypertension, Diabetes and                  Dyslipidemia.  Sonographer:     Jyl Heinz Referring Phys:  [id number] Rex Kras Diagnosing Phys: Adrian Prows MD IMPRESSIONS  1. Left ventricular ejection fraction, by estimation, is 40 to 45%. Left ventricular ejection fraction by 2D MOD biplane is 47.2 %. The left ventricle has mildly decreased function. The left ventricle demonstrates global hypokinesis. The left ventricular internal cavity size was mildly dilated. There is mild concentric left ventricular hypertrophy. Left ventricular diastolic parameters are consistent with Grade II diastolic dysfunction (pseudonormalization). Elevated left ventricular end-diastolic pressure.  2. Right ventricular systolic function is normal. The right ventricular size is normal. There is mildly elevated pulmonary artery  systolic pressure. The estimated right ventricular  systolic pressure is 98.3 mmHg.  3. The mitral valve is normal in structure. Trivial mitral valve regurgitation. No evidence of mitral stenosis.  4. The aortic valve is normal in structure. Aortic valve regurgitation is not visualized. No aortic stenosis is present.  5. The inferior vena cava is dilated in size with <50% respiratory variability, suggesting right atrial pressure of 15 mmHg. Conclusion(s)/Recommendation(s): Findings consistent with non-ischemic cardiomyopathy. FINDINGS  Left Ventricle: Left ventricular ejection fraction, by estimation, is 40 to 45%. Left ventricular ejection fraction by 2D MOD biplane is 47.2 %. The left ventricle has mildly decreased function. The left ventricle demonstrates global hypokinesis. The left ventricular internal cavity size was mildly dilated. There is mild concentric left ventricular hypertrophy. Left ventricular diastolic parameters are consistent with Grade II diastolic dysfunction (pseudonormalization). Elevated left ventricular end-diastolic pressure. Right Ventricle: The right ventricular size is normal. No increase in right ventricular wall thickness. Right ventricular systolic function is normal. There is mildly elevated pulmonary artery systolic pressure. The tricuspid regurgitant velocity is 2.29  m/s, and with an assumed right atrial pressure of 15 mmHg, the estimated right ventricular systolic pressure is 38.2 mmHg. Left Atrium: Left atrial size was normal in size. Right Atrium: Right atrial size was normal in size. Pericardium: There is no evidence of pericardial effusion. Mitral Valve: The mitral valve is normal in structure. Trivial mitral valve regurgitation. No evidence of mitral valve stenosis. Tricuspid Valve: The tricuspid valve is normal in structure. Tricuspid valve regurgitation is trivial. No evidence of tricuspid stenosis. Aortic Valve: The aortic valve is normal in structure. Aortic valve regurgitation is not visualized. No aortic stenosis is  present. Aortic valve peak gradient measures 3.8 mmHg. Pulmonic Valve: The pulmonic valve was normal in structure. Pulmonic valve regurgitation is trivial. No evidence of pulmonic stenosis. Aorta: The aortic root is normal in size and structure. Venous: The inferior vena cava is dilated in size with less than 50% respiratory variability, suggesting right atrial pressure of 15 mmHg. IAS/Shunts: No atrial level shunt detected by color flow Doppler. LEFT VENTRICLE PLAX 2D                        Biplane EF (MOD) LVIDd:         5.90 cm         LV Biplane EF:   Left LVIDs:         4.50 cm                          ventricular LV PW:         1.20 cm                          ejection LV IVS:        1.20 cm                          fraction by LVOT diam:     2.40 cm                          2D MOD LV SV:         74  biplane is LV SV Index:   30                               47.2 %. LVOT Area:     4.52 cm                                Diastology                                LV e' medial:    5.44 cm/s LV Volumes (MOD)               LV E/e' medial:  13.4 LV vol d, MOD    198.0 ml      LV e' lateral:   5.22 cm/s A2C:                           LV E/e' lateral: 13.9 LV vol d, MOD    198.0 ml A4C: LV vol s, MOD    108.0 ml A2C: LV vol s, MOD    103.0 ml A4C: LV SV MOD A2C:   90.0 ml LV SV MOD A4C:   198.0 ml LV SV MOD BP:    94.8 ml RIGHT VENTRICLE             IVC RV S prime:     12.90 cm/s  IVC diam: 2.70 cm TAPSE (M-mode): 2.6 cm LEFT ATRIUM         Index LA diam:    4.20 cm 1.72 cm/m  AORTIC VALVE AV Area (Vmax): 3.98 cm AV Vmax:        98.05 cm/s AV Peak Grad:   3.8 mmHg LVOT Vmax:      86.25 cm/s LVOT Vmean:     64.300 cm/s LVOT VTI:       0.163 m  AORTA Ao Root diam: 3.20 cm Ao Asc diam:  3.30 cm MITRAL VALVE               TRICUSPID VALVE MV Area (PHT): 4.06 cm    TR Peak grad:   21.0 mmHg MV Decel Time: 187 msec    TR Vmax:        229.00 cm/s MV E velocity: 72.70 cm/s MV A velocity: 83.10  cm/s  SHUNTS MV E/A ratio:  0.87        Systemic VTI:  0.16 m                            Systemic Diam: 2.40 cm Adrian Prows MD Electronically signed by Adrian Prows MD Signature Date/Time: 07/02/2021/7:31:12 AM    Final     Cardiac database: EKG: EKG 06/29/2021: Sinus tachycardia at rate of 101 bpm, rightward axis, incomplete right bundle branch block.  Poor R wave progression, cannot exclude anteroseptal infarct old.  No evidence of ischemia.  No significant change from 03/19/2021.  Echocardiogram: 03/05/2021:    1. Left ventricular ejection fraction, by estimation, is 60 to 65%. The left ventricle has normal function. The left ventricle has no regional wall motion abnormalities. Left ventricular diastolic parameters were normal.  2. Right ventricular systolic function is low normal. The right ventricular size is normal. There is mildly elevated pulmonary artery  systolic pressure.  3. Right atrial size was mildly dilated.  4. The mitral valve is normal in structure. No evidence of mitral valve regurgitation.  5. Tricuspid valve regurgitation is mild to moderate.  6. The aortic valve is normal in structure. Aortic valve regurgitation is not visualized.  07/01/2021:  1. Left ventricular ejection fraction, by estimation, is 40 to 45%. Left  ventricular ejection fraction by 2D MOD biplane is 47.2 %. The left  ventricle has mildly decreased function. The left ventricle demonstrates  global hypokinesis. The left  ventricular internal cavity size was mildly dilated. There is mild  concentric left ventricular hypertrophy. Left ventricular diastolic  parameters are consistent with Grade II diastolic dysfunction  (pseudonormalization). Elevated left ventricular  end-diastolic pressure.   2. Right ventricular systolic function is normal. The right ventricular  size is normal. There is mildly elevated pulmonary artery systolic  pressure. The estimated right ventricular systolic pressure is 40.9 mmHg.   3.  The mitral valve is normal in structure. Trivial mitral valve  regurgitation. No evidence of mitral stenosis.   4. The aortic valve is normal in structure. Aortic valve regurgitation is  not visualized. No aortic stenosis is present.   5. The inferior vena cava is dilated in size with <50% respiratory  variability, suggesting right atrial pressure of 15 mmHg.   CT angiogram chest 04/28/2021: 1. No CT evidence of pulmonary embolism. 2. Streaky area of airspace density extending from the right hilum into the right upper lobe may represent atelectasis. Atypical infection is not excluded. Clinical correlation and follow-up recommended. 3. Colonic diverticulosis. Mild pericolonic haziness adjacent to the ascending colon may be chronic or represent mild acute diverticulitis. Clinical correlation is recommended. No diverticular abscess or perforation. 4. Cholelithiasis. 5. Cardiovascular: Mild cardiomegaly. No pericardial effusion. Thoracic aorta is unremarkable.  Aortic Atherosclerosis-mild aortoiliac atherosclerotic plaque.   Scheduled Meds:  aspirin EC  81 mg Oral Daily   atorvastatin  10 mg Oral Daily   dapagliflozin propanediol  10 mg Oral Daily   dolutegravir  50 mg Oral Daily   And   lamiVUDine  300 mg Oral Daily   doxycycline  100 mg Oral Q12H   enoxaparin (LOVENOX) injection  0.5 mg/kg Subcutaneous Q24H   famotidine  40 mg Oral Daily   fluticasone furoate-vilanterol  1 puff Inhalation Daily   gabapentin  600 mg Oral TID   insulin aspart  0-15 Units Subcutaneous TID WC   insulin aspart  0-5 Units Subcutaneous QHS   insulin glargine-yfgn  5 Units Subcutaneous Daily   ipratropium-albuterol  3 mL Nebulization BID   isosorbide-hydrALAZINE  2 tablet Oral TID   labetalol  100 mg Oral BID   magnesium oxide  400 mg Oral BID   predniSONE  20 mg Oral Q breakfast   traZODone  50 mg Oral QHS    Continuous Infusions:   PRN Meds: acetaminophen **OR** acetaminophen, clonazePAM,  methocarbamol, metoprolol tartrate, Muscle Rub, ondansetron **OR** ondansetron (ZOFRAN) IV, oxyCODONE, senna-docusate   IMPRESSION & RECOMMENDATIONS: Bruce Hunter is a 45 y.o. male whose past medical history and cardiac risk factors include: HFpEF, HIV, hypertension, COPD, DMT2 with neuropathy, obesity, CKD 3, gout, CHF, tobacco use   A/C systolic and diastolic heart failure, stage C, NYHA class II: Volume overload most likely secondary to him acute exacerbation of HFrEF, COPD, chronic steroid use, and medication/dietary noncompliance. Improving Has lost approximately 20 pounds since hospitalization. I's and O's are not documented correctly. Renal function slowly improving. Currently on  BiDil, labetalol, Farxiga. If renal function continues to improve tomorrow would recommend starting Entresto 24/26 mg p.o. twice daily Patient has an upcoming office visit with his PCP scheduled for Wednesday, 07/07/2021.  Recommend checking kidney function and if stable to further uptitrate GDMT I will schedule an outpatient follow-up with myself patient is agreeable to follow-up and understands the importance of medication and dietary compliance given his heart failure.  Patient also informed that his LVEF is reduced since in July 2022 and it is now 40-45%. Patient appears motivated with regards to improving his risk factors, adhering to medication compliance, and outpatient follow-up. Continue compression stockings.  Cardiomyopathy: Suspect non-ischemic  No chest pain or angina pectoris Consider stress test as outpatient. Would not recommend heart cath in setting AKI and no angina.  RV function and size reported as normal and mild PHTN.  Monitor for now.   Acute kidney injury on chronic kidney disease: Improving Most likely secondary to diuresis. Hold Lasix and ARB for now.  If renal function remains stable/improving tomorrow would consider adding low-dose Entresto prior to discharge with close  follow-up of renal function with primary care provider. Monitor BUN and creatinine Strict I's and O's and daily weights.  Diabetes mellitus with stage III chronic kidney disease and hyperglycemia:  Currently managed per primary team  Hypertension/HFrEF/chronic kidney disease stage III: Improving Management as discussed above  HIV: Currently being followed by infectious disease.  On antiretroviral medications.  Aortic atherosclerosis: Continue aspirin and statin therapy.  Smoking cigarettes:Educated on the importance of complete smoking cessation.  Patient's questions and concerns were addressed to his satisfaction. He voices understanding of the instructions provided during this encounter.   This note was created using a voice recognition software as a result there may be grammatical errors inadvertently enclosed that do not reflect the nature of this encounter. Every attempt is made to correct such errors.  Rex Kras, DO, [facility]. [place] Office: [phone] 07/03/2021, 11:23 AM

## 2021-07-04 DIAGNOSIS — I5043 Acute on chronic combined systolic (congestive) and diastolic (congestive) heart failure: Secondary | ICD-10-CM | POA: Diagnosis not present

## 2021-07-04 LAB — BASIC METABOLIC PANEL
Anion gap: 10 (ref 5–15)
BUN: 33 mg/dL — ABNORMAL HIGH (ref 6–20)
CO2: 23 mmol/L (ref 22–32)
Calcium: 9 mg/dL (ref 8.9–10.3)
Chloride: 103 mmol/L (ref 98–111)
Creatinine, Ser: 1.92 mg/dL — ABNORMAL HIGH (ref 0.61–1.24)
GFR, Estimated: 43 mL/min — ABNORMAL LOW (ref >60)
Glucose, Bld: 176 mg/dL — ABNORMAL HIGH (ref 70–99)
Potassium: 4.1 mmol/L (ref 3.5–5.1)
Sodium: 136 mmol/L (ref 135–145)

## 2021-07-04 LAB — CBC
HCT: 39.5 % (ref 39.0–52.0)
Hemoglobin: 12 g/dL — ABNORMAL LOW (ref 13.0–17.0)
MCH: 29.6 pg (ref 26.0–34.0)
MCHC: 30.4 g/dL (ref 30.0–36.0)
MCV: 97.3 fL (ref 80.0–100.0)
Platelets: 214 10*3/uL (ref 150–400)
RBC: 4.06 MIL/uL — ABNORMAL LOW (ref 4.22–5.81)
RDW: 18 % — ABNORMAL HIGH (ref 11.5–15.5)
WBC: 11.6 10*3/uL — ABNORMAL HIGH (ref 4.0–10.5)
nRBC: 0 % (ref 0.0–0.2)

## 2021-07-04 LAB — GLUCOSE, CAPILLARY
Glucose-Capillary: 227 mg/dL — ABNORMAL HIGH (ref 70–99)
Glucose-Capillary: 177 mg/dL — ABNORMAL HIGH (ref 70–99)

## 2021-07-04 MED ORDER — DAPAGLIFLOZIN PROPANEDIOL 10 MG PO TABS: 10.0000 mg | ORAL_TABLET | Freq: Every day | ORAL | 0 refills | Status: AC

## 2021-07-04 MED ORDER — LABETALOL HCL 200 MG PO TABS: 100.0000 mg | ORAL_TABLET | Freq: Three times a day (TID) | ORAL | Status: DC

## 2021-07-04 MED ORDER — LABETALOL HCL 100 MG PO TABS: 100.0000 mg | ORAL_TABLET | Freq: Three times a day (TID) | ORAL | 0 refills | Status: DC

## 2021-07-04 MED ORDER — BUMETANIDE 0.5 MG PO TABS: 0.5000 mg | ORAL_TABLET | ORAL | 11 refills | Status: DC | PRN

## 2021-07-04 MED ORDER — ASPIRIN 81 MG PO TBEC: 81.0000 mg | DELAYED_RELEASE_TABLET | Freq: Every day | ORAL | 11 refills | Status: AC

## 2021-07-04 MED ORDER — ATORVASTATIN CALCIUM 10 MG PO TABS: 10.0000 mg | ORAL_TABLET | Freq: Every day | ORAL | 0 refills | Status: DC

## 2021-07-04 NOTE — Discharge Summary (Signed)
Physician Discharge Summary  Bruce Hunter FKC:[id number] DOB: 11/26/1975 DOA: 06/29/2021  PCP: Elwyn Reach, MD  Admit date: 06/29/2021 Discharge date: 07/04/2021  Admitted From: home Disposition:  home  Recommendations for Outpatient Follow-up:  Follow up with PCP on Wednesday and with cardiology in 1 wk Please obtain BMP in 3-4 days Please follow up on the following pending results:  Home Health:no  Equipment/Devices: none  Discharge Condition: Stable Code Status:   Code Status: Full Code Diet recommendation:  Diet Order             Diet Heart Room service appropriate? Yes; Fluid consistency: Thin  Diet effective now                   Brief/Interim Summary: 45 y.o. male with PMH of HIV/ARDS, HTN, COPD diabetes mellitus type 2 with neuropathy, obesity, CKD stage III, gout, CHF, tobacco use presents with complaints of shortness of breath and wheezing.  He was admitted few weeks ago with CHF exacerbation and respiratory failure that required intubation and mechanical ventilation and also tracheostomy.  At that time he was treated with PCP pneumonia and developed renal failure due to septic shock that has resolved.  He was started on prednisone at that time for gout and has been continued on that. In the ED patient received multiple DuoNeb treatments for COPD exacerbation chest x-ray showed bibasilar atelectasis but no acute infiltrate or consolidation. Patient is being treated for COPD exacerbation hypertension urgency acute diastolic heart failure along with AKI on CKD. Seen by cardiology managed for acute on chronic combined systolic and diastolic CHF with EF 74-94%, cardiac meds has been adjusted for goal-directed medical therapy for GDMT. Clinically he seems to have improved, not needing oxygen able to lay flat leg edema has improved.  He does have weight gain likely multifactorial in the setting of his steroid use. He will be discharged home once cleared by cardiology  today as renal function more or less stable at 1.81.9.  Overall appears stable does need close monitoring of his renal function as outpatient.  He is adamant on going home today.  Discharge Diagnoses:  Acute on chronic combined systolic and diastolic CHF exacerbation: LVEF 40-45% with G2 DD proBNP on admission 1121, previous was 1038 on 10/3.  Appreciate cardiology input- leg edema has improved not hypoxic able to lay flat no orthopnea.  GDMT continue with current cardiac meds including Farxiga, BiDil labetalol .  Entresto as OP once creat stable. As needed diuretics at home for weight gain per cardiology prescribed. Check weight follow-up with PCP in unmolested and with cardiology in a week.  He is adamant on going home today  Cont salt and fluid restricted diet.  Previous weight in August was 92.9 kg. Filed Weights   07/02/21 0851 07/03/21 0425 07/04/21 0427  Weight: 127.4 kg 123.2 kg 123.3 kg   Net IO Since Admission: -3,080 mL [07/04/21 1343]   Acute COPD exacerbation Moderate persistent asthma with acute exacerbation: Respiratory status improved, he is not wheezing.  Continue his prednisone taper for his gout.  Continue  his  Breo Ellipta.Monitor.  HIV/AIDS:ID is following, reasonably controlled on current meds Dovato plus of Bactrim per PCP prevention.  T2DM with diabetic neuropathy:A1c stable 6.610/26.  Well-controlled, cont home meds Recent Labs  Lab 07/03/21 1216 07/03/21 1605 07/03/21 2106 07/04/21 0859 07/04/21 1246  GLUCAP 176* 205* 203* 227* 177*    Essential hypertension/Hypertensive urgency:  HLD:BP is is now better controlled after adjusting meds  for CHF.  Continue aspirin, Lipitor   AKI on CKD stage IIIa:baseline creat 1.4 in august 2022. Adjusted other cardiac medication-renal function holding stable at 1.9, he needs repeat BMP in 5 days seeing his PCP on Wednesday Recent Labs  Lab 06/30/21 0450 07/01/21 0236 07/02/21 0204 07/03/21 0415 07/04/21 0206  BUN 36*  43* 45* 35* 33*  CREATININE 1.99* 2.28* 2.11* 1.84* 1.92*   On steroids for more than 3 months is being tapered down, completing History of gout flare has been on prednisone for 3 months taper with 20 mg x 1 week then 10 mg x 1 week then stop patient has a prescription sent continue 20 mg through 11/2 then 10 mg through 11/9.   Class III Obesity:Patient's Body mass index is 39 kg/m. : Will benefit with PCP follow-up, weight loss  healthy lifestyle and outpatient sleep evaluation.  Consults: Cardiology Infectious disease  Subjective: Alert awake oriented wants to go home today. We convinced him to stay 1 more day last night  Discharge Exam: Vitals:   07/04/21 1007 07/04/21 1339  BP: (!) 158/99 (!) 143/93  Pulse:  90  Resp: 17   Temp: 98.7 F (37.1 C) 98.6 F (37 C)  SpO2: 97% (!) 87%   General: Pt is alert, awake, not in acute distress Cardiovascular: RRR, S1/S2 +, no rubs, no gallops Respiratory: CTA bilaterally, no wheezing, no rhonchi Abdominal: Soft, NT, ND, bowel sounds + Extremities: no edema, no cyanosis  Discharge Instructions  Discharge Instructions     Discharge instructions   Complete by: As directed    Please call call MD or return to ER for similar or worsening recurring problem that brought you to hospital or if any fever,nausea/vomiting,abdominal pain, uncontrolled pain, chest pain,  shortness of breath or any other alarming symptoms.  Please follow-up your doctor as instructed: With your PCP in coming Wednesday and with your cardiology in a week.    Please avoid alcohol, smoking, or any other illicit substance and maintain healthy habits including taking your regular medications as prescribed.  You were cared for by a hospitalist during your hospital stay. If you have any questions about your discharge medications or the care you received while you were in the hospital after you are discharged, you can call the unit and ask to speak with the hospitalist  on call if the hospitalist that took care of you is not available.  Once you are discharged, your primary care physician will handle any further medical issues. Please note that NO REFILLS for any discharge medications will be authorized once you are discharged, as it is imperative that you return to your primary care physician (or establish a relationship with a primary care physician if you do not have one) for your aftercare needs so that they can reassess your need for medications and monitor your lab values   Check weight daily. Restrict fluid intake to 1200 ml daily and salt intake to <2 g mdaily. Call MD:  Anytime you have any of the following symptoms: 1) 3 pound weight gain in 24 hours or 5 pounds in 1 week 2) shortness of breath, with or without a dry hacking cough 3) swelling in the hands, feet or stomach 4) if you have to sleep on extra pillows at night in order to breathe   Increase activity slowly   Complete by: As directed       Allergies as of 07/04/2021       Reactions   Lactose Intolerance (  gi) Nausea And Vomiting        Medication List     STOP taking these medications    dapsone 100 MG tablet   furosemide 40 MG tablet Commonly known as: LASIX   hydrALAZINE 25 MG tablet Commonly known as: APRESOLINE   lisinopril 20 MG tablet Commonly known as: ZESTRIL   meloxicam 15 MG tablet Commonly known as: MOBIC   metoprolol tartrate 50 MG tablet Commonly known as: LOPRESSOR   morphine 15 MG 12 hr tablet Commonly known as: MS CONTIN   olmesartan 20 MG tablet Commonly known as: BENICAR       TAKE these medications    acetaminophen 325 MG tablet Commonly known as: TYLENOL Take 1 tablet (325 mg total) by mouth every 4 (four) hours as needed for mild pain.   aspirin 81 MG EC tablet Take 1 tablet (81 mg total) by mouth daily. Swallow whole. Start taking on: July 05, 2021   atorvastatin 10 MG tablet Commonly known as: LIPITOR Take 1 tablet (10 mg  total) by mouth daily. Start taking on: July 05, 2021   B-12 1000 MCG Tabs Take 1 tablet (1,000 mcg total) by mouth daily.   BD Pen Needle Nano U/F 32G X 4 MM Misc Generic drug: Insulin Pen Needle use as directed   bumetanide 0.5 MG tablet Commonly known as: BUMEX Take 1 tablet (0.5 mg total) by mouth as needed (0.5mg  po qday prn basis (take if weight increasing more than 1 pound over 24hrs or 3 pounds over 7 days).).   clonazePAM 0.5 MG tablet Commonly known as: KLONOPIN Take 1 tablet (0.5 mg total) by mouth 3 (three) times daily.   colchicine 0.6 MG tablet Take 0.6 mg by mouth daily.   dapagliflozin propanediol 10 MG Tabs tablet Commonly known as: FARXIGA Take 1 tablet (10 mg total) by mouth daily. Start taking on: July 05, 2021   diclofenac Sodium 1 % Gel Commonly known as: VOLTAREN Apply 2 g topically 4 (four) times daily.   Dovato 50-300 MG tablet Generic drug: dolutegravir-lamiVUDine Take 1 tablet by mouth daily.   famotidine 40 MG tablet Commonly known as: Pepcid Take 1 tablet (40 mg total) by mouth daily.   ferrous sulfate 325 (65 FE) MG EC tablet Take 1 tablet by mouth daily.   gabapentin 300 MG capsule Commonly known as: NEURONTIN Take 2 capsules (600 mg total) by mouth 3 (three) times daily. What changed: when to take this   labetalol 100 MG tablet Commonly known as: NORMODYNE Take 1 tablet (100 mg total) by mouth 3 (three) times daily.   Lantus SoloStar 100 UNIT/ML Solostar Pen Generic drug: insulin glargine Inject 5 Units into the skin daily.   lidocaine 5 % Commonly known as: LIDODERM Place 2 patches onto the skin daily. Remove & Discard patch within 12 hours or as directed by MD   magnesium oxide 400 MG tablet Commonly known as: MAG-OX Take 1 tablet (400 mg total) by mouth 2 (two) times daily.   methocarbamol 500 MG tablet Commonly known as: ROBAXIN Take 1 tablet (500 mg total) by mouth every 6 (six) hours as needed for muscle  spasms.   omeprazole 20 MG capsule Commonly known as: PRILOSEC Take 20 mg by mouth daily.   Oxycodone HCl 10 MG Tabs Take 1 tablet (10 mg total) by mouth every 4 (four) hours as needed. MAX 5 TABS PER DAY AS NEEDED FOR SEVERE PAIN   polyethylene glycol 17 g packet Commonly known as:  MIRALAX / GLYCOLAX Take 17 g by mouth daily as needed for moderate constipation.   predniSONE 10 MG tablet Commonly known as: DELTASONE Take 2 tablets (20 mg total) by mouth daily with breakfast for 7 days, THEN 1 tablet (10 mg total) daily with breakfast for 7 days. Start taking on: July 01, 2021 What changed: See the new instructions.   tamsulosin 0.4 MG Caps capsule Commonly known as: FLOMAX Take 1 capsule (0.4 mg total) by mouth daily after supper.   traZODone 50 MG tablet Commonly known as: DESYREL Take 50 mg by mouth at bedtime.        Follow-up Information     Elwyn Reach, MD. Go on 07/07/2021.   Specialty: Internal Medicine Why: Please attend your hospital discharge appointment with Dr Jonelle Sidle on Wednesday, 07/07/21, at 3:30pm. Contact information: [address] [place] [address] [phone]         Rex Kras, DO Follow up in 1 week(s).   Specialties: Cardiology, Vascular Surgery Contact information: [place] [address] [phone]                Allergies  Allergen Reactions   Lactose Intolerance (Gi) Nausea And Vomiting    The results of significant diagnostics from this hospitalization (including imaging, microbiology, ancillary and laboratory) are listed below for reference.    Microbiology: Recent Results (from the past 240 hour(s))  Resp Panel by RT-PCR (Flu A&B, Covid) Nasopharyngeal Swab     Status: None   Collection Time: 06/29/21  6:34 PM   Specimen: Nasopharyngeal Swab; Nasopharyngeal(NP) swabs in vial transport medium  Result Value Ref Range Status   SARS Coronavirus 2 by RT PCR NEGATIVE NEGATIVE  Final    Comment: (NOTE) SARS-CoV-2 target nucleic acids are NOT DETECTED.  The SARS-CoV-2 RNA is generally detectable in upper respiratory specimens during the acute phase of infection. The lowest concentration of SARS-CoV-2 viral copies this assay can detect is 138 copies/mL. A negative result does not preclude SARS-Cov-2 infection and should not be used as the sole basis for treatment or other patient management decisions. A negative result may occur with  improper specimen collection/handling, submission of specimen other than nasopharyngeal swab, presence of viral mutation(s) within the areas targeted by this assay, and inadequate number of viral copies(<138 copies/mL). A negative result must be combined with clinical observations, patient history, and epidemiological information. The expected result is Negative.  Fact Sheet for Patients:  EntrepreneurPulse.com.au  Fact Sheet for Healthcare Providers:  IncredibleEmployment.be  This test is no t yet approved or cleared by the [place] FDA and  has been authorized for detection and/or diagnosis of SARS-CoV-2 by FDA under an Emergency Use Authorization (EUA). This EUA will remain  in effect (meaning this test can be used) for the duration of the COVID-19 declaration under Section 564(b)(1) of the Act, 21 U.S.C.section 360bbb-3(b)(1), unless the authorization is terminated  or revoked sooner.       Influenza A by PCR NEGATIVE NEGATIVE Final   Influenza B by PCR NEGATIVE NEGATIVE Final    Comment: (NOTE) The Xpert Xpress SARS-CoV-2/FLU/RSV plus assay is intended as an aid in the diagnosis of influenza from Nasopharyngeal swab specimens and should not be used as a sole basis for treatment. Nasal washings and aspirates are unacceptable for Xpert Xpress SARS-CoV-2/FLU/RSV testing.  Fact Sheet for Patients: EntrepreneurPulse.com.au  Fact Sheet for Healthcare  Providers: IncredibleEmployment.be  This test is not yet approved or cleared by  the [employer] and has been authorized for detection and/or diagnosis of SARS-CoV-2 by FDA under an Emergency Use Authorization (EUA). This EUA will remain in effect (meaning this test can be used) for the duration of the COVID-19 declaration under Section 564(b)(1) of the Act, 21 U.S.C. section 360bbb-3(b)(1), unless the authorization is terminated or revoked.  Performed at [facility], [place] [address] [place],  [address]     Procedures/Studies: DG Chest 2 View  Result Date: 06/29/2021 CLINICAL DATA:  Shortness of breath. EXAM: CHEST - 2 VIEW COMPARISON:  None. FINDINGS: Stable cardiomegaly. Minimal bibasilar subsegmental atelectasis is noted. The visualized skeletal structures are unremarkable. IMPRESSION: Minimal bibasilar subsegmental atelectasis. Electronically Signed   By: Marijo Conception M.D.   On: 06/29/2021 12:48   ECHOCARDIOGRAM LIMITED  Result Date: 07/02/2021    ECHOCARDIOGRAM LIMITED REPORT   Patient Name:   Bruce Hunter Date of Exam: 07/01/2021 Medical Rec #:  [id number]      Height:       70.0 in Accession #:    [id number]     Weight:       291.0 lb Date of Birth:  [birth year]      BSA:          2.448 m Patient Age:    45 years       BP:           188/116 mmHg Patient Gender: M              HR:           72 bpm. Exam Location:  Inpatient Procedure: Limited Echo, Color Doppler and Cardiac Doppler Indications:     CHF  History:         Patient has prior history of Echocardiogram examinations, most                  recent 03/05/2021. Risk Factors:Hypertension, Diabetes and                  Dyslipidemia.  Sonographer:     Jyl Heinz Referring Phys:  [id number] Rex Kras Diagnosing Phys: Adrian Prows MD IMPRESSIONS  1. Left ventricular ejection fraction, by estimation, is 40 to 45%. Left ventricular ejection fraction by 2D MOD biplane is 47.2 %. The left ventricle has  mildly decreased function. The left ventricle demonstrates global hypokinesis. The left ventricular internal cavity size was mildly dilated. There is mild concentric left ventricular hypertrophy. Left ventricular diastolic parameters are consistent with Grade II diastolic dysfunction (pseudonormalization). Elevated left ventricular end-diastolic pressure.  2. Right ventricular systolic function is normal. The right ventricular size is normal. There is mildly elevated pulmonary artery systolic pressure. The estimated right ventricular systolic pressure is 81.0 mmHg.  3. The mitral valve is normal in structure. Trivial mitral valve regurgitation. No evidence of mitral stenosis.  4. The aortic valve is normal in structure. Aortic valve regurgitation is not visualized. No aortic stenosis is present.  5. The inferior vena cava is dilated in size with <50% respiratory variability, suggesting right atrial pressure of 15 mmHg. Conclusion(s)/Recommendation(s): Findings consistent with non-ischemic cardiomyopathy. FINDINGS  Left Ventricle: Left ventricular ejection fraction, by estimation, is 40 to 45%. Left ventricular ejection fraction by 2D MOD biplane is 47.2 %. The left ventricle has mildly decreased function. The left ventricle demonstrates global hypokinesis. The left ventricular internal cavity size was mildly dilated. There is mild concentric left ventricular hypertrophy. Left ventricular diastolic parameters are consistent with  Grade II diastolic dysfunction (pseudonormalization). Elevated left ventricular end-diastolic pressure. Right Ventricle: The right ventricular size is normal. No increase in right ventricular wall thickness. Right ventricular systolic function is normal. There is mildly elevated pulmonary artery systolic pressure. The tricuspid regurgitant velocity is 2.29  m/s, and with an assumed right atrial pressure of 15 mmHg, the estimated right ventricular systolic pressure is 29.4 mmHg. Left Atrium:  Left atrial size was normal in size. Right Atrium: Right atrial size was normal in size. Pericardium: There is no evidence of pericardial effusion. Mitral Valve: The mitral valve is normal in structure. Trivial mitral valve regurgitation. No evidence of mitral valve stenosis. Tricuspid Valve: The tricuspid valve is normal in structure. Tricuspid valve regurgitation is trivial. No evidence of tricuspid stenosis. Aortic Valve: The aortic valve is normal in structure. Aortic valve regurgitation is not visualized. No aortic stenosis is present. Aortic valve peak gradient measures 3.8 mmHg. Pulmonic Valve: The pulmonic valve was normal in structure. Pulmonic valve regurgitation is trivial. No evidence of pulmonic stenosis. Aorta: The aortic root is normal in size and structure. Venous: The inferior vena cava is dilated in size with less than 50% respiratory variability, suggesting right atrial pressure of 15 mmHg. IAS/Shunts: No atrial level shunt detected by color flow Doppler. LEFT VENTRICLE PLAX 2D                        Biplane EF (MOD) LVIDd:         5.90 cm         LV Biplane EF:   Left LVIDs:         4.50 cm                          ventricular LV PW:         1.20 cm                          ejection LV IVS:        1.20 cm                          fraction by LVOT diam:     2.40 cm                          2D MOD LV SV:         74                               biplane is LV SV Index:   30                               47.2 %. LVOT Area:     4.52 cm                                Diastology                                LV e' medial:    5.44 cm/s LV Volumes (MOD)               LV  E/e' medial:  13.4 LV vol d, MOD    198.0 ml      LV e' lateral:   5.22 cm/s A2C:                           LV E/e' lateral: 13.9 LV vol d, MOD    198.0 ml A4C: LV vol s, MOD    108.0 ml A2C: LV vol s, MOD    103.0 ml A4C: LV SV MOD A2C:   90.0 ml LV SV MOD A4C:   198.0 ml LV SV MOD BP:    94.8 ml RIGHT VENTRICLE             IVC RV S  prime:     12.90 cm/s  IVC diam: 2.70 cm TAPSE (M-mode): 2.6 cm LEFT ATRIUM         Index LA diam:    4.20 cm 1.72 cm/m  AORTIC VALVE AV Area (Vmax): 3.98 cm AV Vmax:        98.05 cm/s AV Peak Grad:   3.8 mmHg LVOT Vmax:      86.25 cm/s LVOT Vmean:     64.300 cm/s LVOT VTI:       0.163 m  AORTA Ao Root diam: 3.20 cm Ao Asc diam:  3.30 cm MITRAL VALVE               TRICUSPID VALVE MV Area (PHT): 4.06 cm    TR Peak grad:   21.0 mmHg MV Decel Time: 187 msec    TR Vmax:        229.00 cm/s MV E velocity: 72.70 cm/s MV A velocity: 83.10 cm/s  SHUNTS MV E/A ratio:  0.87        Systemic VTI:  0.16 m                            Systemic Diam: 2.40 cm Adrian Prows MD Electronically signed by Adrian Prows MD Signature Date/Time: 07/02/2021/7:31:12 AM    Final     Labs: BNP (last 3 results) Recent Labs    03/04/21 1814 06/29/21 1850  BNP 837.4* 884.1*   Basic Metabolic Panel: Recent Labs  Lab 06/30/21 0450 07/01/21 0236 07/02/21 0204 07/03/21 0415 07/04/21 0206  NA 135 136 136 138 136  K 4.7 4.3 4.0 3.8 4.1  CL 100 98 100 101 103  CO2 26 28 27 26 23   GLUCOSE 293* 222* 192* 196* 176*  BUN 36* 43* 45* 35* 33*  CREATININE 1.99* 2.28* 2.11* 1.84* 1.92*  CALCIUM 8.8* 9.1 9.0 9.0 9.0   Liver Function Tests: Recent Labs  Lab 06/29/21 1154  AST 143*  ALT 111*  ALKPHOS 81  BILITOT 0.5  PROT 6.8  ALBUMIN 3.6   No results for input(s): LIPASE, AMYLASE in the last 168 hours. No results for input(s): AMMONIA in the last 168 hours. CBC: Recent Labs  Lab 06/29/21 1154 06/29/21 1912 06/30/21 0450 07/03/21 0415 07/04/21 0206  WBC 10.4  --  12.1* 12.6* 11.6*  HGB 11.6* 12.9* 11.7* 11.6* 12.0*  HCT 39.5 38.0* 38.2* 37.0* 39.5  MCV 100.8*  --  97.7 96.1 97.3  PLT 253  --  242 216 214   Cardiac Enzymes: No results for input(s): CKTOTAL, CKMB, CKMBINDEX, TROPONINI in the last 168 hours. BNP: Invalid input(s): POCBNP CBG: Recent Labs  Lab 07/03/21 1216 07/03/21 1605 07/03/21 2106  07/04/21 6606  07/04/21 1246  GLUCAP 176* 205* 203* 227* 177*   D-Dimer No results for input(s): DDIMER in the last 72 hours. Hgb A1c No results for input(s): HGBA1C in the last 72 hours. Lipid Profile No results for input(s): CHOL, HDL, LDLCALC, TRIG, CHOLHDL, LDLDIRECT in the last 72 hours. Thyroid function studies No results for input(s): TSH, T4TOTAL, T3FREE, THYROIDAB in the last 72 hours.  Invalid input(s): FREET3 Anemia work up No results for input(s): VITAMINB12, FOLATE, FERRITIN, TIBC, IRON, RETICCTPCT in the last 72 hours. Urinalysis    Component Value Date/Time   COLORURINE STRAW (A) 04/18/2021 2328   APPEARANCEUR CLEAR 04/18/2021 2328   LABSPEC 1.012 04/18/2021 2328   PHURINE 5.0 04/18/2021 2328   GLUCOSEU NEGATIVE 04/18/2021 2328   HGBUR NEGATIVE 04/18/2021 2328   BILIRUBINUR NEGATIVE 04/18/2021 2328   KETONESUR NEGATIVE 04/18/2021 2328   PROTEINUR NEGATIVE 04/18/2021 2328   NITRITE NEGATIVE 04/18/2021 2328   LEUKOCYTESUR NEGATIVE 04/18/2021 2328   Sepsis Labs Invalid input(s): PROCALCITONIN,  WBC,  LACTICIDVEN Microbiology Recent Results (from the past 240 hour(s))  Resp Panel by RT-PCR (Flu A&B, Covid) Nasopharyngeal Swab     Status: None   Collection Time: 06/29/21  6:34 PM   Specimen: Nasopharyngeal Swab; Nasopharyngeal(NP) swabs in vial transport medium  Result Value Ref Range Status   SARS Coronavirus 2 by RT PCR NEGATIVE NEGATIVE Final    Comment: (NOTE) SARS-CoV-2 target nucleic acids are NOT DETECTED.  The SARS-CoV-2 RNA is generally detectable in upper respiratory specimens during the acute phase of infection. The lowest concentration of SARS-CoV-2 viral copies this assay can detect is 138 copies/mL. A negative result does not preclude SARS-Cov-2 infection and should not be used as the sole basis for treatment or other patient management decisions. A negative result may occur with  improper specimen collection/handling, submission of specimen  other than nasopharyngeal swab, presence of viral mutation(s) within the areas targeted by this assay, and inadequate number of viral copies(<138 copies/mL). A negative result must be combined with clinical observations, patient history, and epidemiological information. The expected result is Negative.  Fact Sheet for Patients:  EntrepreneurPulse.com.au  Fact Sheet for Healthcare Providers:  IncredibleEmployment.be  This test is no t yet approved or cleared by the [place] FDA and  has been authorized for detection and/or diagnosis of SARS-CoV-2 by FDA under an Emergency Use Authorization (EUA). This EUA will remain  in effect (meaning this test can be used) for the duration of the COVID-19 declaration under Section 564(b)(1) of the Act, 21 U.S.C.section 360bbb-3(b)(1), unless the authorization is terminated  or revoked sooner.       Influenza A by PCR NEGATIVE NEGATIVE Final   Influenza B by PCR NEGATIVE NEGATIVE Final    Comment: (NOTE) The Xpert Xpress SARS-CoV-2/FLU/RSV plus assay is intended as an aid in the diagnosis of influenza from Nasopharyngeal swab specimens and should not be used as a sole basis for treatment. Nasal washings and aspirates are unacceptable for Xpert Xpress SARS-CoV-2/FLU/RSV testing.  Fact Sheet for Patients: EntrepreneurPulse.com.au  Fact Sheet for Healthcare Providers: IncredibleEmployment.be  This test is not yet approved or cleared by the [place] FDA and has been authorized for detection and/or diagnosis of SARS-CoV-2 by FDA under an Emergency Use Authorization (EUA). This EUA will remain in effect (meaning this test can be used) for the duration of the COVID-19 declaration under Section 564(b)(1) of the Act, 21 U.S.C. section 360bbb-3(b)(1), unless the authorization is terminated or revoked.  Performed at [facility],  [address] [place],  [place] [address]      Time coordinating discharge: 35 minutes  SIGNED: Antonieta Pert, MD  [facility] 07/04/2021, 1:43 PM  If 7PM-7AM, please contact night-coverage [email or web address]

## 2021-07-04 NOTE — Care Management (Signed)
Farxiga 30 day coupon provided to patient

## 2021-07-04 NOTE — Progress Notes (Signed)
Progress Note  Patient Name: Bruce Hunter Date of Encounter: 07/04/2021  Attending physician: Antonieta Pert, MD Primary care provider: Elwyn Reach, MD Primary Cardiologist: Dr. Adrian Prows Consultant:Winna Golla Terri Skains, DO  Subjective: Bruce Hunter is a 45 y.o. male who was seen and examined at bedside Denies any chest pain at rest or with effort related activities. Shortness of breath has improved significantly since hospitalization. Has diuresed well and no longer has lower extremity swelling. Patient is eager to go home.  Objective: Vital Signs in the last 24 hours: Temp:  [97.6 F (36.4 C)-98.7 F (37.1 C)] 98.7 F (37.1 C) (10/30 1007) Pulse Rate:  [89-98] 98 (10/30 0809) Resp:  [15-25] 17 (10/30 1007) BP: (123-162)/(76-103) 158/99 (10/30 1007) SpO2:  [91 %-97 %] 97 % (10/30 1007) Weight:  [123.3 kg] 123.3 kg (10/30 0427)  Intake/Output:  Intake/Output Summary (Last 24 hours) at 07/04/2021 1041 Last data filed at 07/04/2021 0437 Gross per 24 hour  Intake 240 ml  Output 850 ml  Net -610 ml    Net IO Since Admission: -3,080 mL [07/04/21 1041]  Telemetry: Personally reviewed. NSR  Physical examination: PHYSICAL EXAM: Vitals with BMI 07/04/2021 07/04/2021 07/04/2021  Height - - -  Weight - - -  BMI - - -  Systolic 458 099 -  Diastolic 99 833 -  Pulse - - 98    CONSTITUTIONAL: Appears older than stated age.  Well-developed and well-nourished. No acute distress.  SKIN: Skin is warm and dry. No rash noted. No cyanosis. No pallor. No jaundice HEAD: Normocephalic and atraumatic.  EYES: No scleral icterus MOUTH/THROAT: Moist oral membranes.  NECK: Short neck stature making JVP evaluation difficult. No thyromegaly noted. No carotid bruits  LYMPHATIC: No visible cervical adenopathy.  CHEST Normal respiratory effort. No intercostal retractions  LUNGS: Clear to auscultation bilaterally.  No stridor.  No rales.  CARDIOVASCULAR: Regular, positive S1-S2, no murmurs  rubs or gallops appreciated. ABDOMINAL: Obese, soft, nontender, nondistended, positive bowel sounds all 4 quadrants. No apparent ascites.  EXTREMITIES: bilateral 1+ peripheral edema, warm to touch. HEMATOLOGIC: No significant bruising NEUROLOGIC: Oriented to person, place, and time. Nonfocal. Normal muscle tone.  PSYCHIATRIC: Normal mood and affect. Normal behavior. Cooperative  Lab Results: Hematology Recent Labs  Lab 06/30/21 0450 07/03/21 0415 07/04/21 0206  WBC 12.1* 12.6* 11.6*  RBC 3.91* 3.85* 4.06*  HGB 11.7* 11.6* 12.0*  HCT 38.2* 37.0* 39.5  MCV 97.7 96.1 97.3  MCH 29.9 30.1 29.6  MCHC 30.6 31.4 30.4  RDW 18.2* 18.3* 18.0*  PLT 242 216 214    Chemistry Recent Labs  Lab 06/29/21 1154 06/29/21 1912 07/02/21 0204 07/03/21 0415 07/04/21 0206  NA 136   < > 136 138 136  K 4.6   < > 4.0 3.8 4.1  CL 97*   < > 100 101 103  CO2 25   < > 27 26 23   GLUCOSE 341*   < > 192* 196* 176*  BUN 32*   < > 45* 35* 33*  CREATININE 1.85*   < > 2.11* 1.84* 1.92*  CALCIUM 8.7*   < > 9.0 9.0 9.0  PROT 6.8  --   --   --   --   ALBUMIN 3.6  --   --   --   --   AST 143*  --   --   --   --   ALT 111*  --   --   --   --   ALKPHOS 81  --   --   --   --  BILITOT 0.5  --   --   --   --   GFRNONAA 45*   < > 39* 46* 43*  ANIONGAP 14   < > 9 11 10    < > = values in this interval not displayed.     Cardiac Enzymes: Cardiac Panel (last 3 results) No results for input(s): CKTOTAL, CKMB, TROPONINIHS, RELINDX in the last 72 hours.  BNP (last 3 results) Recent Labs    03/04/21 1814 06/29/21 1850  BNP 837.4* 151.4*    ProBNP (last 3 results) Recent Labs    06/07/21 1627 06/24/21 1217  PROBNP 1,038* 1,121*     DDimer No results for input(s): DDIMER in the last 168 hours.   Hemoglobin A1c:  Lab Results  Component Value Date   HGBA1C 6.6 (H) 06/30/2021   MPG 142.72 06/30/2021    TSH  Recent Labs    02/12/21 0322  TSH 0.430    Lipid Panel     Component Value  Date/Time   CHOL 219 (H) 07/01/2021 0236   TRIG 113 07/01/2021 0236   HDL 67 07/01/2021 0236   CHOLHDL 3.3 07/01/2021 0236   VLDL 23 07/01/2021 0236   LDLCALC 129 (H) 07/01/2021 0236    Imaging: No results found.  Cardiac database: EKG: EKG 06/29/2021: Sinus tachycardia at rate of 101 bpm, rightward axis, incomplete right bundle branch block.  Poor R wave progression, cannot exclude anteroseptal infarct old.  No evidence of ischemia.  No significant change from 03/19/2021.  Echocardiogram: 03/05/2021:    1. Left ventricular ejection fraction, by estimation, is 60 to 65%. The left ventricle has normal function. The left ventricle has no regional wall motion abnormalities. Left ventricular diastolic parameters were normal.  2. Right ventricular systolic function is low normal. The right ventricular size is normal. There is mildly elevated pulmonary artery systolic pressure.  3. Right atrial size was mildly dilated.  4. The mitral valve is normal in structure. No evidence of mitral valve regurgitation.  5. Tricuspid valve regurgitation is mild to moderate.  6. The aortic valve is normal in structure. Aortic valve regurgitation is not visualized.  07/01/2021:  1. Left ventricular ejection fraction, by estimation, is 40 to 45%. Left  ventricular ejection fraction by 2D MOD biplane is 47.2 %. The left  ventricle has mildly decreased function. The left ventricle demonstrates  global hypokinesis. The left  ventricular internal cavity size was mildly dilated. There is mild  concentric left ventricular hypertrophy. Left ventricular diastolic  parameters are consistent with Grade II diastolic dysfunction  (pseudonormalization). Elevated left ventricular  end-diastolic pressure.   2. Right ventricular systolic function is normal. The right ventricular  size is normal. There is mildly elevated pulmonary artery systolic  pressure. The estimated right ventricular systolic pressure is 39.0 mmHg.    3. The mitral valve is normal in structure. Trivial mitral valve  regurgitation. No evidence of mitral stenosis.   4. The aortic valve is normal in structure. Aortic valve regurgitation is  not visualized. No aortic stenosis is present.   5. The inferior vena cava is dilated in size with <50% respiratory  variability, suggesting right atrial pressure of 15 mmHg.   CT angiogram chest 04/28/2021: 1. No CT evidence of pulmonary embolism. 2. Streaky area of airspace density extending from the right hilum into the right upper lobe may represent atelectasis. Atypical infection is not excluded. Clinical correlation and follow-up recommended. 3. Colonic diverticulosis. Mild pericolonic haziness adjacent to the ascending colon may be  chronic or represent mild acute diverticulitis. Clinical correlation is recommended. No diverticular abscess or perforation. 4. Cholelithiasis. 5. Cardiovascular: Mild cardiomegaly. No pericardial effusion. Thoracic aorta is unremarkable.  Aortic Atherosclerosis-mild aortoiliac atherosclerotic plaque.   Scheduled Meds:  aspirin EC  81 mg Oral Daily   atorvastatin  10 mg Oral Daily   dapagliflozin propanediol  10 mg Oral Daily   dolutegravir  50 mg Oral Daily   And   lamiVUDine  300 mg Oral Daily   doxycycline  100 mg Oral Q12H   enoxaparin (LOVENOX) injection  60 mg Subcutaneous Q24H   famotidine  40 mg Oral Daily   fluticasone furoate-vilanterol  1 puff Inhalation Daily   gabapentin  600 mg Oral TID   insulin aspart  0-15 Units Subcutaneous TID WC   insulin aspart  0-5 Units Subcutaneous QHS   insulin glargine-yfgn  5 Units Subcutaneous Daily   ipratropium-albuterol  3 mL Nebulization BID   isosorbide-hydrALAZINE  2 tablet Oral TID   labetalol  100 mg Oral BID   magnesium oxide  400 mg Oral BID   predniSONE  20 mg Oral Q breakfast   traZODone  50 mg Oral QHS    Continuous Infusions:   PRN Meds: acetaminophen **OR** acetaminophen, clonazePAM,  methocarbamol, metoprolol tartrate, Muscle Rub, ondansetron **OR** ondansetron (ZOFRAN) IV, oxyCODONE, senna-docusate   IMPRESSION & RECOMMENDATIONS: Bruce Hunter is a 45 y.o. male whose past medical history and cardiac risk factors include: HFpEF, HIV, hypertension, COPD, DMT2 with neuropathy, obesity, CKD 3, gout, CHF, tobacco use   A/C systolic and diastolic heart failure, stage C, NYHA class II: Volume overload most likely secondary to acute exacerbation of HFrEF, COPD, chronic steroid use, and medication/dietary noncompliance. Improving Has lost approximately 20 pounds since hospitalization, but not back to baseline. I's and O's are not documented correctly. Renal function slowly improving. Currently on BiDil, labetalol, Farxiga.   His serum creatinine today has not improved compared to yesterday and also not close to baseline. Will hold off on starting ARB/ARNi or scheduled loop diuretics for now to help preserve his renal function.  He is adamant to go home today. Therefore, I suggested we add Bumex 0.5mg  po qday prn basis (take if weight increasing more than 1 pound over 24hrs or 3 pounds over 7 days).  He has an appt to see PCP on Wednesday and based on renal function (I.e. if improved start Entresto 49/51mg  po bid with follow up labs a week later).  I will have the office coordinate follow up with [place] the following week as well.  Patient is agreeable with the plan of care and understands that he is not on optimal guideline therapy due to his renal function.  Patient also informed that his LVEF is reduced since in July 2022 and it is now 40-45%. Patient appears motivated with regards to improving his risk factors, adhering to medication compliance, and outpatient follow-up. Continue compression stockings, low salt diet, medication compliance, and complete the taper dose of steroids as prescribed.  Cardiomyopathy: Suspect non-ischemic  No chest pain or angina pectoris Recommend  stress test as outpatient. Would not recommend heart cath in setting AKI and no angina.  RV function and size reported as normal and mild PHTN.  Monitor for now.   Acute kidney injury on chronic kidney disease: Improving Most likely secondary to diuresis. Hold Lasix and ARB for now.  Monitor BUN and creatinine Strict I's and O's and daily weights. See above  Diabetes mellitus with stage  III chronic kidney disease and hyperglycemia:  Currently managed per primary team  Hypertension/HFrEF/chronic kidney disease stage III: Improving Management as discussed above  HIV: Currently being followed by infectious disease.  On antiretroviral medications.  Aortic atherosclerosis: Continue aspirin and statin therapy.  Smoking cigarettes:Educated on the importance of complete smoking cessation.  Patient's questions and concerns were addressed to his satisfaction. He voices understanding of the instructions provided during this encounter.   This note was created using a voice recognition software as a result there may be grammatical errors inadvertently enclosed that do not reflect the nature of this encounter. Every attempt is made to correct such errors.  Rex Kras, DO, [facility]. [place] Office: [phone] 07/04/2021, 10:41 AM

## 2021-07-05 ENCOUNTER — Other Ambulatory Visit (HOSPITAL_COMMUNITY): Payer: Self-pay

## 2021-07-05 LAB — GLUCOSE, CAPILLARY: Glucose-Capillary: 189 mg/dL — ABNORMAL HIGH (ref 70–99)

## 2021-07-06 ENCOUNTER — Other Ambulatory Visit (HOSPITAL_COMMUNITY): Payer: Self-pay

## 2021-07-07 ENCOUNTER — Other Ambulatory Visit: Payer: Self-pay

## 2021-07-07 ENCOUNTER — Telehealth: Payer: Self-pay

## 2021-07-07 DIAGNOSIS — I429 Cardiomyopathy, unspecified: Secondary | ICD-10-CM

## 2021-07-07 DIAGNOSIS — I5031 Acute diastolic (congestive) heart failure: Secondary | ICD-10-CM

## 2021-07-07 DIAGNOSIS — I10 Essential (primary) hypertension: Secondary | ICD-10-CM

## 2021-07-07 DIAGNOSIS — N1832 Chronic kidney disease, stage 3b: Secondary | ICD-10-CM

## 2021-07-07 NOTE — Telephone Encounter (Signed)
Please order BMP, NT-proBNP, MG level.  Release it and inform the patient.  Please remind the patient to bring the caridac medication bottles at the next office visit to help facilitate a more accurate medication reconciliation.

## 2021-07-07 NOTE — Telephone Encounter (Signed)
-----   Message from Vassie Moselle sent at 07/05/2021 12:09 PM EDT ----- Regarding: TOC This patient needs a TOC and Labs per ST . He is scheduled on 11/14 with St . TOC: Y   RE: HF, recent hospitalization, and needs labs prior to coming in (talk to [place]).

## 2021-07-07 NOTE — Telephone Encounter (Signed)
Ok done

## 2021-07-09 ENCOUNTER — Other Ambulatory Visit (HOSPITAL_COMMUNITY): Payer: Self-pay

## 2021-07-13 ENCOUNTER — Ambulatory Visit: Payer: Medicaid Other | Admitting: Podiatry

## 2021-07-16 ENCOUNTER — Ambulatory Visit (INDEPENDENT_AMBULATORY_CARE_PROVIDER_SITE_OTHER): Payer: Medicaid Other | Admitting: Podiatry

## 2021-07-16 ENCOUNTER — Other Ambulatory Visit: Payer: Self-pay

## 2021-07-16 DIAGNOSIS — E1169 Type 2 diabetes mellitus with other specified complication: Secondary | ICD-10-CM | POA: Diagnosis not present

## 2021-07-16 DIAGNOSIS — M7989 Other specified soft tissue disorders: Secondary | ICD-10-CM

## 2021-07-16 DIAGNOSIS — B351 Tinea unguium: Secondary | ICD-10-CM | POA: Diagnosis not present

## 2021-07-16 DIAGNOSIS — E1142 Type 2 diabetes mellitus with diabetic polyneuropathy: Secondary | ICD-10-CM | POA: Diagnosis not present

## 2021-07-16 DIAGNOSIS — I5043 Acute on chronic combined systolic (congestive) and diastolic (congestive) heart failure: Secondary | ICD-10-CM | POA: Diagnosis not present

## 2021-07-16 NOTE — Progress Notes (Signed)
  Subjective:  Patient ID: Bruce Hunter, male    DOB: 08/13/1976,  MRN: [id number]  Chief Complaint  Patient presents with   Nail Problem    RFC Bilateral nail trim. 1-5.     45 y.o. male presents for follow-up. Complains of edema to both lower extremities. 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  Objective:  Physical Exam: warm, good capillary refill, nail exam onychomycosis of the toenails, no ulcerative lesions, normal DP and PT pulses, and diminished sensation. Pitting edema and trophic skin changes to BLE.  No images are attached to the encounter.  Assessment:   1. Onychomycosis of multiple toenails with type 2 diabetes mellitus and peripheral neuropathy ([place])    Plan:  Patient was evaluated and treated and all questions answered.  Onychomycosis, Diabetes and DPN -Patient is diabetic with a qualifying condition for at risk foot care.  Procedure: Nail Debridement Type of Debridement: manual, sharp debridement. Instrumentation: Nail nipper, rotary burr. Number of Nails: 10  Bilateral Lower Extremity Edema -Likely 2/2 CHF. On Bumex. Advised he f/u with his PCP for titration of this.     No follow-ups on file.

## 2021-07-19 ENCOUNTER — Ambulatory Visit: Payer: Medicaid Other | Admitting: Cardiology

## 2021-07-22 ENCOUNTER — Encounter: Payer: Self-pay | Admitting: Infectious Disease

## 2021-07-22 ENCOUNTER — Ambulatory Visit (INDEPENDENT_AMBULATORY_CARE_PROVIDER_SITE_OTHER): Payer: Medicaid Other | Admitting: Infectious Disease

## 2021-07-22 ENCOUNTER — Other Ambulatory Visit: Payer: Self-pay

## 2021-07-22 VITALS — BP 135/83 | HR 111 | Temp 99.8°F

## 2021-07-22 DIAGNOSIS — R635 Abnormal weight gain: Secondary | ICD-10-CM

## 2021-07-22 DIAGNOSIS — N1832 Chronic kidney disease, stage 3b: Secondary | ICD-10-CM | POA: Diagnosis not present

## 2021-07-22 DIAGNOSIS — Z23 Encounter for immunization: Secondary | ICD-10-CM | POA: Diagnosis not present

## 2021-07-22 DIAGNOSIS — I129 Hypertensive chronic kidney disease with stage 1 through stage 4 chronic kidney disease, or unspecified chronic kidney disease: Secondary | ICD-10-CM

## 2021-07-22 DIAGNOSIS — Z794 Long term (current) use of insulin: Secondary | ICD-10-CM | POA: Diagnosis not present

## 2021-07-22 DIAGNOSIS — B20 Human immunodeficiency virus [HIV] disease: Secondary | ICD-10-CM | POA: Diagnosis present

## 2021-07-22 DIAGNOSIS — B59 Pneumocystosis: Secondary | ICD-10-CM

## 2021-07-22 DIAGNOSIS — I5031 Acute diastolic (congestive) heart failure: Secondary | ICD-10-CM

## 2021-07-22 DIAGNOSIS — F17210 Nicotine dependence, cigarettes, uncomplicated: Secondary | ICD-10-CM | POA: Diagnosis not present

## 2021-07-22 DIAGNOSIS — E1165 Type 2 diabetes mellitus with hyperglycemia: Secondary | ICD-10-CM | POA: Diagnosis not present

## 2021-07-22 DIAGNOSIS — F419 Anxiety disorder, unspecified: Secondary | ICD-10-CM

## 2021-07-22 NOTE — Progress Notes (Signed)
Chief complaint: [name] is again complaining of severe lower extremity edema and pain associate with that and inability to walk.   patient ID: Bruce Hunter, male    DOB: 10-14-1975, 45 y.o.   MRN: [id number]  HPI  Bruce Hunter is a 45 year old black man who was recently diagnosed with HIV and AIDS.  He recently had a retracted hospital stay from March 30, 2021 till the very end of August 2022.  Admitted with respiratory failure that required mechanical intubation and ventilation and ultimately underwent tracheostomy.  He completed a treatment course for PCP pneumonia though PCP was never proven he did have evidence of deteriorating few days during his stay at [facility] when he was off anti-PCP medications.  Developed septic physiology with shock and acute renal failure requiring CVVHD briefly.  Fortunately was able to come off the ventilator and his renal function recovered completely.  His hospital course was further complicated by diffuse gout that required a corticosteroid taper.  He also was diagnosed with diabetes mellitus while in the hospital and now is on insulin.  He ultimately is discharged in the inpatient unit to inpatient rehab where he was further taken care of.  My partners Dr. Gale Journey and Baxter Flattery saw him again when he had recurrent fevers.  F AFB cultures were sent as well as testing for gonorrhea chlamydia CT chest abdomen pelvis.    He was started back on corticosteroids with resolution of his fevers.  He now finish steroids for gout and is having intermittent fevers again at times.  I suspect the fevers were  due to IRIS.  Bruce Hunter was quite volume overloaded on several occasions when Ii saw him as an outpatient and edematous with a proBNP over thousand.  Ultimately Bruce Hunter was admitted the hospital and was treated with diuretics for his volume overload as well as for COPD exacerbation with steroids  He was dramatically improved while in the hospital.  Today he returns for  follow-up and is complaining of worsening lower extremity edema out to the point where he cannot stand.  There is also worsening his depressive symptoms.  Says the pain is also not well controlled with current medications.  Should be off corticosteroids at this point in time.  He had an appoint with cardiology but did not know of the time because he says because he was not called.  I have made another referral to cardiology and discussed his care with Dr. Jonelle Sidle over Aaron Edelman cell phone today.     Past Medical History:  Diagnosis Date   Anxiety 05/07/2021   Asthma    Diabetes mellitus without complication ([place])    Gout    Hyperlipidemia    Hypertension    Hypertensive urgency 06/24/2021   Obesity    Obesity 06/24/2021   Pulmonary edema 06/24/2021    Past Surgical History:  Procedure Laterality Date   BIOPSY  02/15/2021   Procedure: BIOPSY;  Surgeon: Ronnette Juniper, MD;  Location: [facility];  Service: Gastroenterology;;   COLONOSCOPY WITH PROPOFOL N/A 02/15/2021   Procedure: COLONOSCOPY WITH PROPOFOL;  Surgeon: Ronnette Juniper, MD;  Location: [facility];  Service: Gastroenterology;  Laterality: N/A;   ESOPHAGOGASTRODUODENOSCOPY (EGD) WITH PROPOFOL N/A 02/15/2021   Procedure: ESOPHAGOGASTRODUODENOSCOPY (EGD) WITH PROPOFOL;  Surgeon: Ronnette Juniper, MD;  Location: [facility];  Service: Gastroenterology;  Laterality: N/A;   GANGLION CYST EXCISION Right 02/26/2020   Procedure: EXCISION TOPHUS RIGHT FOOT;  Surgeon: Evelina Bucy, DPM;  Location: [facility];  Service: Podiatry;  Laterality: Right;   NO PAST SURGERIES      Family History  Problem Relation Age of Onset   Asthma Mother    Cancer Mother    Diabetes Mother    Obesity Mother    Obesity Sister    Diabetes Brother    Obesity Brother    Cancer Maternal Grandmother    Diabetes Maternal Grandmother    Diabetes Other       Social History   Socioeconomic History   Marital status: Single    Spouse name:  Not on file   Number of children: Not on file   Years of education: Not on file   Highest education level: Not on file  Occupational History   Not on file  Tobacco Use   Smoking status: Every Day    Packs/day: 1.00    Types: Cigarettes   Smokeless tobacco: Never  Substance and Sexual Activity   Alcohol use: Yes   Drug use: Yes    Frequency: 1.0 times per week    Types: Marijuana    Comment: marijuana at midnight last night; daily   Sexual activity: Yes  Other Topics Concern   Not on file  Social History Narrative   Not on file   Social Determinants of Health   Financial Resource Strain: Not on file  Food Insecurity: Not on file  Transportation Needs: Not on file  Physical Activity: Not on file  Stress: Not on file  Social Connections: Not on file    Allergies  Allergen Reactions   Lactose Intolerance (Gi) Nausea And Vomiting     Current Outpatient Medications:    acetaminophen (TYLENOL) 325 MG tablet, Take 1 tablet (325 mg total) by mouth every 4 (four) hours as needed for mild pain., Disp: 100 tablet, Rfl: 2   aspirin EC 81 MG EC tablet, Take 1 tablet (81 mg total) by mouth daily. Swallow whole., Disp: 30 tablet, Rfl: 11   atorvastatin (LIPITOR) 10 MG tablet, Take 1 tablet (10 mg total) by mouth daily., Disp: 30 tablet, Rfl: 0   bumetanide (BUMEX) 0.5 MG tablet, Take 1 tablet (0.5 mg total) by mouth as needed (0.5mg  po qday prn basis (take if weight increasing more than 1 pound over 24hrs or 3 pounds over 7 days).)., Disp: 30 tablet, Rfl: 11   clonazePAM (KLONOPIN) 0.5 MG tablet, Take 1 tablet (0.5 mg total) by mouth 3 (three) times daily., Disp: 90 tablet, Rfl: 2   colchicine 0.6 MG tablet, Take 0.6 mg by mouth daily., Disp: , Rfl:    cyanocobalamin 1000 MCG tablet, Take 1 tablet (1,000 mcg total) by mouth daily., Disp: 100 tablet, Rfl: 2   dapagliflozin propanediol (FARXIGA) 10 MG TABS tablet, Take 1 tablet (10 mg total) by mouth daily., Disp: 30 tablet, Rfl: 0    diclofenac Sodium (VOLTAREN) 1 % GEL, Apply 2 g topically 4 (four) times daily., Disp: 300 g, Rfl: 0   famotidine (PEPCID) 40 MG tablet, Take 1 tablet (40 mg total) by mouth daily., Disp: 30 tablet, Rfl: 3   ferrous sulfate 325 (65 FE) MG EC tablet, Take 1 tablet by mouth daily., Disp: , Rfl:    gabapentin (NEURONTIN) 300 MG capsule, Take 2 capsules (600 mg total) by mouth 3 (three) times daily. (Patient taking differently: Take 600 mg by mouth 4 (four) times daily.), Disp: 180 capsule, Rfl: 0   insulin glargine (LANTUS SOLOSTAR) 100 UNIT/ML Solostar Pen, Inject 5 Units into the skin daily., Disp: 15 mL, Rfl:  0   Insulin Pen Needle 32G X 4 MM MISC, use as directed, Disp: 100 each, Rfl: 0   labetalol (NORMODYNE) 100 MG tablet, Take 1 tablet (100 mg total) by mouth 3 (three) times daily., Disp: 90 tablet, Rfl: 0   lidocaine (LIDODERM) 5 %, Place 2 patches onto the skin daily. Remove & Discard patch within 12 hours or as directed by MD, Disp: 30 patch, Rfl: 0   magnesium oxide (MAG-OX) 400 MG tablet, Take 1 tablet (400 mg total) by mouth 2 (two) times daily., Disp: 60 tablet, Rfl: 0   methocarbamol (ROBAXIN) 500 MG tablet, Take 1 tablet (500 mg total) by mouth every 6 (six) hours as needed for muscle spasms., Disp: 120 tablet, Rfl: 0   omeprazole (PRILOSEC) 20 MG capsule, Take 20 mg by mouth daily., Disp: , Rfl:    Oxycodone HCl 10 MG TABS, Take 1 tablet (10 mg total) by mouth every 4 (four) hours as needed. MAX 5 TABS PER DAY AS NEEDED FOR SEVERE PAIN, Disp: 25 tablet, Rfl: 0   polyethylene glycol (MIRALAX / GLYCOLAX) 17 g packet, Take 17 g by mouth daily as needed for moderate constipation. (Patient not taking: No sig reported), Disp: , Rfl:    tamsulosin (FLOMAX) 0.4 MG CAPS capsule, Take 1 capsule (0.4 mg total) by mouth daily after supper., Disp: 30 capsule, Rfl: 0   traZODone (DESYREL) 50 MG tablet, Take 50 mg by mouth at bedtime., Disp: , Rfl:    Review of Systems  Constitutional:  Positive for  fatigue and unexpected weight change. Negative for activity change, appetite change, chills, diaphoresis and fever.  HENT:  Negative for congestion, rhinorrhea, sinus pressure, sneezing, sore throat and trouble swallowing.   Eyes:  Negative for photophobia and visual disturbance.  Respiratory:  Negative for cough, chest tightness, shortness of breath, wheezing and stridor.   Cardiovascular:  Positive for leg swelling. Negative for chest pain and palpitations.  Gastrointestinal:  Negative for abdominal distention, abdominal pain, anal bleeding, blood in stool, constipation, diarrhea, nausea and vomiting.  Genitourinary:  Negative for difficulty urinating, dysuria, flank pain and hematuria.  Musculoskeletal:  Positive for myalgias. Negative for arthralgias, back pain, gait problem and joint swelling.  Skin:  Negative for color change, pallor, rash and wound.  Neurological:  Negative for dizziness, tremors, weakness and light-headedness.  Hematological:  Negative for adenopathy. Does not bruise/bleed easily.  Psychiatric/Behavioral:  Positive for dysphoric mood. Negative for agitation, behavioral problems, confusion, decreased concentration and sleep disturbance.       Objective:   Physical Exam Constitutional:      Appearance: He is well-developed. He is obese.  HENT:     Head: Normocephalic and atraumatic.  Eyes:     Conjunctiva/sclera: Conjunctivae normal.  Cardiovascular:     Rate and Rhythm: Normal rate and regular rhythm.     Heart sounds: No murmur heard.   No friction rub. No gallop.  Pulmonary:     Effort: Pulmonary effort is normal. No respiratory distress.     Breath sounds: Normal breath sounds. No stridor. No wheezing or rhonchi.  Abdominal:     General: There is no distension.     Palpations: Abdomen is soft.  Musculoskeletal:        General: Tenderness present. Normal range of motion.     Cervical back: Normal range of motion and neck supple.     Right lower leg: Edema  present.     Left lower leg: Edema present.  Skin:  General: Skin is warm and dry.     Coloration: Skin is not pale.     Findings: No erythema or rash.  Neurological:     General: No focal deficit present.     Mental Status: He is alert and oriented to person, place, and time.  Psychiatric:        Attention and Perception: Attention normal.        Mood and Affect: Mood is anxious and depressed.        Speech: Speech is delayed.        Behavior: Behavior is slowed.        Thought Content: Thought content normal.        Cognition and Memory: Memory normal.        Judgment: Judgment normal.          Assessment & Plan:   Volume overload:  Been suffering from worsening lower extremity edema that is painful to palpation with difficulty standing.  He needs to be seen by his primary care physician as soon as possible and Dr. Jonelle Sidle was eager to see him.  Joshuajames said he needed make sure he had a ride.  He also should be seen by cardiology having missed the appointment he says he was not aware of the appointment time I have made a new referral to them.  Echocardiogram is also pending  I will check a proBNP today as well as CBC and CMP.  Hypertension: Dramatically better than when we saw him last.  Smoking: Unfortunately is continue to smoke which is not helping his pulmonary status.    Weight gain again more likely multifactorial in context of fluid overload potentially also gaining weight in the context of integrase strand transfer inhibitor use  Diabetes mellitus I will check an A1c per request of Dr. Jonelle Sidle  Steroid use he should be tapered off at this point time  CKD: creatinine from October reviewed and stable in context of diuresis in the inpatient world  CMP Latest Ref Rng & Units 07/04/2021 07/03/2021 07/02/2021  Glucose 70 - 99 mg/dL 176(H) 196(H) 192(H)  BUN 6 - 20 mg/dL 33(H) 35(H) 45(H)  Creatinine 0.61 - 1.24 mg/dL 1.92(H) 1.84(H) 2.11(H)  Sodium 135 - 145  mmol/L 136 138 136  Potassium 3.5 - 5.1 mmol/L 4.1 3.8 4.0  Chloride 98 - 111 mmol/L 103 101 100  CO2 22 - 32 mmol/L 23 26 27   Calcium 8.9 - 10.3 mg/dL 9.0 9.0 9.0  Total Protein 6.5 - 8.1 g/dL - - -  Total Bilirubin 0.3 - 1.2 mg/dL - - -  Alkaline Phos 38 - 126 U/L - - -  AST 15 - 41 U/L - - -  ALT 0 - 44 U/L - - -     HIV disease: Check viral load CD4 count today I will continue Dovato  Vaccine counseling we recommended that he get flu shot today Pneumovax and hepatitis B second dose and he agreed to all of these 3.  I spent  41b minutes with the patient including face to face counseling of the patient re his HIV, his CHF, volume overload, CKD, steroid use, smoking, depression  along with  review of medical records before and during the visit and in coordination of his care wth Dr. Jonelle Sidle

## 2021-07-23 LAB — T-HELPER CELL (CD4) - (RCID CLINIC ONLY)
CD4 % Helper T Cell: 20 % — ABNORMAL LOW (ref 33–65)
CD4 T Cell Abs: 260 /uL — ABNORMAL LOW (ref 400–1790)

## 2021-07-26 LAB — COMPLETE METABOLIC PANEL WITH GFR
AG Ratio: 1.1 (calc) (ref 1.0–2.5)
ALT: 16 U/L (ref 9–46)
AST: 14 U/L (ref 10–40)
Albumin: 3.9 g/dL (ref 3.6–5.1)
Alkaline phosphatase (APISO): 83 U/L (ref 36–130)
BUN/Creatinine Ratio: 21 (calc) (ref 6–22)
BUN: 37 mg/dL — ABNORMAL HIGH (ref 7–25)
CO2: 30 mmol/L (ref 20–32)
Calcium: 10 mg/dL (ref 8.6–10.3)
Chloride: 98 mmol/L (ref 98–110)
Creat: 1.78 mg/dL — ABNORMAL HIGH (ref 0.60–1.29)
Globulin: 3.6 g/dL (calc) (ref 1.9–3.7)
Glucose, Bld: 152 mg/dL — ABNORMAL HIGH (ref 65–99)
Potassium: 5.2 mmol/L (ref 3.5–5.3)
Sodium: 137 mmol/L (ref 135–146)
Total Bilirubin: 0.3 mg/dL (ref 0.2–1.2)
Total Protein: 7.5 g/dL (ref 6.1–8.1)
eGFR: 47 mL/min/{1.73_m2} — ABNORMAL LOW (ref >=60)

## 2021-07-26 LAB — CBC WITH DIFFERENTIAL/PLATELET
Absolute Monocytes: 1266 {cells}/uL — ABNORMAL HIGH (ref 200–950)
Basophils Absolute: 45 {cells}/uL (ref 0–200)
Basophils Relative: 0.4 %
Eosinophils Absolute: 113 {cells}/uL (ref 15–500)
Eosinophils Relative: 1 %
HCT: 38.6 % (ref 38.5–50.0)
Hemoglobin: 12.3 g/dL — ABNORMAL LOW (ref 13.2–17.1)
Lymphs Abs: 1277 {cells}/uL (ref 850–3900)
MCH: 29.6 pg (ref 27.0–33.0)
MCHC: 31.9 g/dL — ABNORMAL LOW (ref 32.0–36.0)
MCV: 92.8 fL (ref 80.0–100.0)
MPV: 10.4 fL (ref 7.5–12.5)
Monocytes Relative: 11.2 %
Neutro Abs: 8599 {cells}/uL — ABNORMAL HIGH (ref 1500–7800)
Neutrophils Relative %: 76.1 %
Platelets: 300 Thousand/uL (ref 140–400)
RBC: 4.16 Million/uL — ABNORMAL LOW (ref 4.20–5.80)
RDW: 15.3 % — ABNORMAL HIGH (ref 11.0–15.0)
Total Lymphocyte: 11.3 %
WBC: 11.3 Thousand/uL — ABNORMAL HIGH (ref 3.8–10.8)

## 2021-07-26 LAB — HIV-1 RNA QUANT-NO REFLEX-BLD
HIV 1 RNA Quant: 50 Copies/mL — ABNORMAL HIGH
HIV-1 RNA Quant, Log: 1.7 Log cps/mL — ABNORMAL HIGH

## 2021-07-26 LAB — HEMOGLOBIN A1C
Hgb A1c MFr Bld: 7.4 %{Hb} — ABNORMAL HIGH (ref <5.7)
Mean Plasma Glucose: 166 mg/dL
eAG (mmol/L): 9.2 mmol/L

## 2021-07-26 LAB — RPR: RPR Ser Ql: NONREACTIVE

## 2021-07-26 LAB — NT-PROBNP: Pro B Natriuretic peptide (BNP): 735 pg/mL — ABNORMAL HIGH

## 2021-07-27 ENCOUNTER — Other Ambulatory Visit (HOSPITAL_COMMUNITY): Payer: Self-pay

## 2021-08-08 NOTE — Progress Notes (Deleted)
Cardiology Office Note:    Date:  08/08/2021   ID:  Bruce Hunter, DOB 1976-06-22, MRN [id number]  PCP:  Elwyn Reach, MD   [facility] Providers Cardiologist:  None { Click to update primary MD,subspecialty MD or APP then REFRESH:1}  *** Referring MD: Tommy Medal, Lavell Islam, MD   Chief Complaint/Reason for Referral:  ***1}     ASSESSMENT & PLAN:    Acute on chronic combined systolic and diastolic CHF (congestive heart failure) ([place])  Primary hypertension  Hyperlipidemia, unspecified hyperlipidemia type  Type 2 diabetes mellitus with complication, with long-term current use of insulin ([place])  Stage 3b chronic kidney disease ([place])  Will check BMP.  We will continue [place].  No beta-blocker given COPD but may be able to start in the future.  Based on BMP we will start spironolactone and losartan.  We will start isosorbide dinitrate 20 mg 3 times daily and hydralazine 37.5 mg 3 times daily.  I would like to check an echocardiogram in a few months time if it still remains low and his creatinine is stable we will proceed to CT angiography or coronary angiography.  He has multiple cardiovascular risk factors.  He does have global hypokinesis so I think this most likely is a nonischemic cardiomyopathy.  His CT scan demonstrated no coronary calcium but he is a young person and may have soft and not calcified plaque.  An ischemic evaluation at some point is required particularly if his ejection fraction remains abnormal.  I will refer to our pharmacy division for med titration for heart failure and lipids.  Starting medications as above.  Given his history of diabetes his LDL goal is less than 70.  Increase atorvastatin to 40.  Continue aspirin 81 mg as well as [place].  I will see him back in 3 months or earlier if needed.       {Are you ordering a CV Procedure (e.g. stress test, cath, DCCV, TEE, etc)?   Press F2        :[id number]}   Dispo:  No follow-ups on file.       Medication Adjustments/Labs and Tests Ordered: Current medicines are reviewed at length with the patient today.  Concerns regarding medicines are outlined above.    Tests Ordered: No orders of the defined types were placed in this encounter.    Medication Changes: No orders of the defined types were placed in this encounter.    History of Present Illness:    FOCUSED CARDIOVASCULAR PROBLEM LIST:   1.  Chronic diastolic heart failure; echocardiogram July 2022 demonstrated normal ejection fraction however echocardiogram October 2022 demonstrates an ejection fraction of 40 to 45% with global hypokinesis; aortic but no coronary calcification on CT scan 2.  Hyperlipidemia 3.  Hypertension 4.  Type 2 diabetes 5.  Chronic kidney disease with a creatinine of around 2 6.  HIV on Dovato   The patient is a 45 y.o. male with the indicated medical history here for hospital follow-up.  The patient was admitted with a COPD exacerbation.  An echocardiogram was done which showed mildly diminished ejection fraction of 40 to 45% with global hypokinesis.  He was stabilized and ultimately discharged on regimen including Farxiga and BiDil.       Previous Medical History: Past Medical History:  Diagnosis Date   Anxiety 05/07/2021   Asthma    Diabetes mellitus without complication ([facility])    Gout    Hyperlipidemia    Hypertension  Hypertensive urgency 06/24/2021   Obesity    Obesity 06/24/2021   Pulmonary edema 06/24/2021     Current Medications: No outpatient medications have been marked as taking for the 08/09/21 encounter (Appointment) with Early Osmond, MD.     Allergies:    Lactose intolerance (gi)   Social History:   Social History   Tobacco Use   Smoking status: Every Day    Packs/day: 1.00    Types: Cigarettes   Smokeless tobacco: Never  Substance Use Topics   Alcohol use: Yes   Drug use: Yes    Frequency: 1.0 times per week    Types: Marijuana    Comment:  marijuana at midnight last night; daily     Family Hx: Family History  Problem Relation Age of Onset   Asthma Mother    Cancer Mother    Diabetes Mother    Obesity Mother    Obesity Sister    Diabetes Brother    Obesity Brother    Cancer Maternal Grandmother    Diabetes Maternal Grandmother    Diabetes Other      Review of Systems:   Please see the history of present illness.    All other systems reviewed and are negative.  EKGs/Labs/Other Test Reviewed:    EKG:  EKG today demonstrates ***; prior EKGs demonstrate ***  Prior CV studies:  TTE 10/22  1. Left ventricular ejection fraction, by estimation, is 40 to 45%. Left  ventricular ejection fraction by 2D MOD biplane is 47.2 %. The left  ventricle has mildly decreased function. The left ventricle demonstrates  global hypokinesis. The left  ventricular internal cavity size was mildly dilated. There is mild  concentric left ventricular hypertrophy. Left ventricular diastolic  parameters are consistent with Grade II diastolic dysfunction  (pseudonormalization). Elevated left ventricular  end-diastolic pressure.   2. Right ventricular systolic function is normal. The right ventricular  size is normal. There is mildly elevated pulmonary artery systolic  pressure. The estimated right ventricular systolic pressure is 16.1 mmHg.   3. The mitral valve is normal in structure. Trivial mitral valve  regurgitation. No evidence of mitral stenosis.   4. The aortic valve is normal in structure. Aortic valve regurgitation is  not visualized. No aortic stenosis is present.   5. The inferior vena cava is dilated in size with <50% respiratory  variability, suggesting right atrial pressure of 15 mmHg.   Conclusion(s)/Recommendation(s): Findings consistent with non-ischemic  cardiomyopathy.   ECHO COMPLETE WO IMAGING ENHANCING AGENT 03/05/2021  Narrative ECHOCARDIOGRAM REPORT    Patient Name:   Bruce Hunter Date of Exam:  03/05/2021 Medical Rec #:  [id number]      Height:       70.0 in Accession #:    [id number]     Weight:       200.0 lb Date of Birth:  05-Sep-1976      BSA:          2.087 m Patient Age:    46 years       BP:           98/61 mmHg Patient Gender: M              HR:           91 bpm. Exam Location:  Inpatient  Procedure: 2D Echo, Cardiac Doppler and Color Doppler  Indications:    I50.33 Acute on chronic diastolic (congestive) heart failure  History:  Patient has no prior history of Echocardiogram examinations. Risk Factors:Hypertension, Diabetes and Dyslipidemia.  Sonographer:    Tiffany Dance Referring Phys: [id number] [place]   1. Left ventricular ejection fraction, by estimation, is 60 to 65%. The left ventricle has normal function. The left ventricle has no regional wall motion abnormalities. Left ventricular diastolic parameters were normal. 2. Right ventricular systolic function is low normal. The right ventricular size is normal. There is mildly elevated pulmonary artery systolic pressure. 3. Right atrial size was mildly dilated. 4. The mitral valve is normal in structure. No evidence of mitral valve regurgitation. 5. Tricuspid valve regurgitation is mild to moderate. 6. The aortic valve is normal in structure. Aortic valve regurgitation is not visualized.  FINDINGS Left Ventricle: Left ventricular ejection fraction, by estimation, is 60 to 65%. The left ventricle has normal function. The left ventricle has no regional wall motion abnormalities. The left ventricular internal cavity size was normal in size. There is no left ventricular hypertrophy. Left ventricular diastolic parameters were normal.  Right Ventricle: The right ventricular size is normal. Right vetricular wall thickness was not well visualized. Right ventricular systolic function is low normal. There is mildly elevated pulmonary artery systolic pressure. The tricuspid regurgitant velocity is  2.85 m/s, and with an assumed right atrial pressure of 8 mmHg, the estimated right ventricular systolic pressure is 68.1 mmHg.  Left Atrium: Left atrial size was normal in size.  Right Atrium: Right atrial size was mildly dilated.  Pericardium: There is no evidence of pericardial effusion.  Mitral Valve: The mitral valve is normal in structure. No evidence of mitral valve regurgitation.  Tricuspid Valve: The tricuspid valve is normal in structure. Tricuspid valve regurgitation is mild to moderate.  Aortic Valve: The aortic valve is normal in structure. Aortic valve regurgitation is not visualized.  Pulmonic Valve: The pulmonic valve was not well visualized. Pulmonic valve regurgitation is not visualized.  Aorta: The aortic root and ascending aorta are structurally normal, with no evidence of dilitation.  IAS/Shunts: The atrial septum is grossly normal.   LEFT VENTRICLE PLAX 2D LVIDd:         4.40 cm  Diastology LVIDs:         2.80 cm  LV e' medial:    8.27 cm/s LV PW:         1.60 cm  LV E/e' medial:  6.5 LV IVS:        1.00 cm  LV e' lateral:   10.10 cm/s LVOT diam:     2.00 cm  LV E/e' lateral: 5.3 LV SV:         36 LV SV Index:   17 LVOT Area:     3.14 cm   RIGHT VENTRICLE             IVC RV Basal diam:  4.10 cm     IVC diam: 2.40 cm RV Mid diam:    3.40 cm RV S prime:     13.10 cm/s TAPSE (M-mode): 1.7 cm  LEFT ATRIUM           Index       RIGHT ATRIUM           Index LA diam:      3.60 cm 1.72 cm/m  RA Area:     20.20 cm LA Vol (A2C): 61.8 ml 29.61 ml/m RA Volume:   64.80 ml  31.04 ml/m LA Vol (A4C): 39.1 ml 18.73 ml/m AORTIC VALVE LVOT Vmax:  70.10 cm/s LVOT Vmean:  45.000 cm/s LVOT VTI:    0.115 m  AORTA Ao Root diam: 3.60 cm Ao Asc diam:  3.20 cm  MITRAL VALVE               TRICUSPID VALVE MV Area (PHT): 3.91 cm    TR Peak grad:   32.5 mmHg MV Decel Time: 194 msec    TR Vmax:        285.00 cm/s MV E velocity: 53.90 cm/s MV A velocity: 69.20 cm/s   SHUNTS MV E/A ratio:  0.78        Systemic VTI:  0.12 m Systemic Diam: 2.00 cm  Mertie Moores MD Electronically signed by Mertie Moores MD Signature Date/Time: 03/05/2021/3:09:52 PM    Final     Imaging studies that I have independently reviewed today: ***  Recent Labs: 02/12/2021: TSH 0.430 05/03/2021: Magnesium 1.5 06/29/2021: B Natriuretic Peptide 151.4 07/22/2021: ALT 16; BUN 37; Creat 1.78; Hemoglobin 12.3; Platelets 300; Potassium 5.2; Pro B Natriuretic peptide (BNP) 735; Sodium 137   Recent Lipid Panel Lab Results  Component Value Date/Time   CHOL 219 (H) 07/01/2021 02:36 AM   TRIG 113 07/01/2021 02:36 AM   HDL 67 07/01/2021 02:36 AM   LDLCALC 129 (H) 07/01/2021 02:36 AM     Risk Assessment/Calculations:    {Does this patient have ATRIAL FIBRILLATION?:[id number]}      Physical Exam:    VS:  There were no vitals taken for this visit.   Wt Readings from Last 3 Encounters:  07/04/21 271 lb 12.8 oz (123.3 kg)  06/24/21 291 lb (132 kg)  04/27/21 204 lb 12.9 oz (92.9 kg)    GENERAL:  No apparent distress, AOx3 HEENT:  No carotid bruits, +2 carotid impulses, no scleral icterus CAR: RRR Irregular RR*** no murmurs***, gallops, rubs, or thrills RES:  Clear to auscultation bilaterally ABD:  Soft, nontender, nondistended, positive bowel sounds x 4 VASC:  +2 radial pulses, +2 carotid pulses, palpable pedal pulses NEURO:  CN 2-12 grossly intact; motor and sensory grossly intact PSYCH:  No active depression or anxiety EXT:  No edema, ecchymosis, or cyanosis    Signed, Early Osmond, MD  08/08/2021 2:29 PM    [facility] [address], [place], [place]  [address] Phone: [phone]; Fax: [phone]

## 2021-08-09 ENCOUNTER — Ambulatory Visit: Payer: Medicaid Other | Admitting: Internal Medicine

## 2021-08-16 ENCOUNTER — Other Ambulatory Visit (HOSPITAL_COMMUNITY): Payer: Self-pay

## 2021-08-19 ENCOUNTER — Other Ambulatory Visit (HOSPITAL_COMMUNITY): Payer: Self-pay

## 2021-08-19 ENCOUNTER — Ambulatory Visit (INDEPENDENT_AMBULATORY_CARE_PROVIDER_SITE_OTHER): Payer: Medicaid Other | Admitting: Infectious Disease

## 2021-08-19 ENCOUNTER — Encounter: Payer: Self-pay | Admitting: Infectious Disease

## 2021-08-19 ENCOUNTER — Other Ambulatory Visit: Payer: Self-pay

## 2021-08-19 ENCOUNTER — Ambulatory Visit (INDEPENDENT_AMBULATORY_CARE_PROVIDER_SITE_OTHER): Payer: Medicaid Other

## 2021-08-19 VITALS — BP 133/91 | HR 85 | Temp 98.6°F | Ht 71.0 in | Wt 271.0 lb

## 2021-08-19 DIAGNOSIS — R6521 Severe sepsis with septic shock: Secondary | ICD-10-CM | POA: Diagnosis not present

## 2021-08-19 DIAGNOSIS — M7989 Other specified soft tissue disorders: Secondary | ICD-10-CM | POA: Diagnosis not present

## 2021-08-19 DIAGNOSIS — A419 Sepsis, unspecified organism: Secondary | ICD-10-CM

## 2021-08-19 DIAGNOSIS — N1832 Chronic kidney disease, stage 3b: Secondary | ICD-10-CM

## 2021-08-19 DIAGNOSIS — M792 Neuralgia and neuritis, unspecified: Secondary | ICD-10-CM

## 2021-08-19 DIAGNOSIS — B59 Pneumocystosis: Secondary | ICD-10-CM | POA: Diagnosis not present

## 2021-08-19 DIAGNOSIS — I5043 Acute on chronic combined systolic (congestive) and diastolic (congestive) heart failure: Secondary | ICD-10-CM | POA: Diagnosis not present

## 2021-08-19 DIAGNOSIS — J9601 Acute respiratory failure with hypoxia: Secondary | ICD-10-CM | POA: Diagnosis not present

## 2021-08-19 DIAGNOSIS — I5031 Acute diastolic (congestive) heart failure: Secondary | ICD-10-CM

## 2021-08-19 DIAGNOSIS — Z23 Encounter for immunization: Secondary | ICD-10-CM | POA: Diagnosis present

## 2021-08-19 DIAGNOSIS — I10 Essential (primary) hypertension: Secondary | ICD-10-CM

## 2021-08-19 DIAGNOSIS — R635 Abnormal weight gain: Secondary | ICD-10-CM | POA: Diagnosis not present

## 2021-08-19 DIAGNOSIS — F17210 Nicotine dependence, cigarettes, uncomplicated: Secondary | ICD-10-CM

## 2021-08-19 DIAGNOSIS — B20 Human immunodeficiency virus [HIV] disease: Secondary | ICD-10-CM | POA: Diagnosis not present

## 2021-08-19 MED ORDER — SULFAMETHOXAZOLE-TRIMETHOPRIM 800-160 MG PO TABS
1.0000 | ORAL_TABLET | Freq: Every day | ORAL | 5 refills | Status: DC
  Filled 2021-08-19: qty 30, 30d supply, fill #0

## 2021-08-19 NOTE — Progress Notes (Signed)
° °  Covid-19 Vaccination Clinic  Name:  Bruce Hunter    MRN: [id number] DOB: 27-Aug-1976  08/19/2021  Bruce Hunter was observed post Covid-19 immunization for 15 minutes without incident. He was provided with Vaccine Information Sheet and instruction to access the V-Safe system.   Bruce Hunter was instructed to call 911 with any severe reactions post vaccine: Difficulty breathing  Swelling of face and throat  A fast heartbeat  A bad rash all over body  Dizziness and weakness   Immunizations Administered     Name Date Dose VIS Date Route   Pfizer Covid-19 Vaccine Bivalent Booster 08/19/2021 11:35 AM 0.3 mL 05/05/2021 Intramuscular   Manufacturer: [place]   Lot: [id number]   [place]: [phone]

## 2021-08-19 NOTE — Progress Notes (Signed)
Chief complaint: he is complaining of some weight gain in his abdomen  patient ID: Bruce Hunter, male    DOB: 1975-11-20, 45 y.o.   MRN: [id number]  HPI  Bruce Hunter is a 45 year old black man who was recently diagnosed with HIV and AIDS.  He recently had a protracted  hospital stay from March 30, 2021 till the very end of August 2022.  Admitted with respiratory failure that required mechanical intubation and ventilation and ultimately underwent tracheostomy.  He completed a treatment course for PCP pneumonia though PCP was never proven he did have evidence of deteriorating few days during his stay at [facility] when he was off anti-PCP medications.  Developed septic physiology with shock and acute renal failure requiring CVVHD briefly.  Fortunately was able to come off the ventilator and his renal function recovered completely.  His hospital course was further complicated by diffuse gout that required a corticosteroid taper.  He also was diagnosed with diabetes mellitus while in the hospital and now is on insulin.  He ultimately is discharged in the inpatient unit to inpatient rehab where he was further taken care of.  My partners Dr. Gale Journey and Baxter Flattery saw him again when he had recurrent fevers.  F AFB cultures were sent as well as testing for gonorrhea chlamydia CT chest abdomen pelvis.    He was started back on corticosteroids with resolution of his fevers.  He now finish steroids for gout and is having intermittent fevers again at times.  I suspect the fevers were  due to IRIS.  [place] was quite volume overloaded on several occasions when Ii saw him as an outpatient and edematous with a proBNP over thousand.  Ultimately Nelson was admitted the hospital and was treated with diuretics for his volume overload as well as for COPD exacerbation with steroids  He was dramatically improved while in the hospital.   He had an appoint with cardiology but did not know of the time because he says  because he was not called.  When I last saw Fread his viral load was reasonably suppressed at 50 and his CD4 count risen above 200.  Today appears much better than I have seen in a long time.  His lower extremity edema is markedly improved.  His blood pressure also dramatically improved the best blood pressure we have seen with him recently.  He had a list of all of his medications been does not appear to be on Bactrim for PCP prophylaxis.      Past Medical History:  Diagnosis Date   Anxiety 05/07/2021   Asthma    Diabetes mellitus without complication ([place])    Gout    Hyperlipidemia    Hypertension    Hypertensive urgency 06/24/2021   Obesity    Obesity 06/24/2021   Pulmonary edema 06/24/2021    Past Surgical History:  Procedure Laterality Date   BIOPSY  02/15/2021   Procedure: BIOPSY;  Surgeon: Ronnette Juniper, MD;  Location: [facility];  Service: Gastroenterology;;   COLONOSCOPY WITH PROPOFOL N/A 02/15/2021   Procedure: COLONOSCOPY WITH PROPOFOL;  Surgeon: Ronnette Juniper, MD;  Location: [facility];  Service: Gastroenterology;  Laterality: N/A;   ESOPHAGOGASTRODUODENOSCOPY (EGD) WITH PROPOFOL N/A 02/15/2021   Procedure: ESOPHAGOGASTRODUODENOSCOPY (EGD) WITH PROPOFOL;  Surgeon: Ronnette Juniper, MD;  Location: [facility];  Service: Gastroenterology;  Laterality: N/A;   GANGLION CYST EXCISION Right 02/26/2020   Procedure: EXCISION TOPHUS RIGHT FOOT;  Surgeon: Evelina Bucy, DPM;  Location: [facility];  Service: Podiatry;  Laterality: Right;   NO PAST SURGERIES      Family History  Problem Relation Age of Onset   Asthma Mother    Cancer Mother    Diabetes Mother    Obesity Mother    Obesity Sister    Diabetes Brother    Obesity Brother    Cancer Maternal Grandmother    Diabetes Maternal Grandmother    Diabetes Other       Social History   Socioeconomic History   Marital status: Single    Spouse name: Not on file   Number of children: Not on file    Years of education: Not on file   Highest education level: Not on file  Occupational History   Not on file  Tobacco Use   Smoking status: Every Day    Packs/day: 1.00    Types: Cigarettes   Smokeless tobacco: Never  Substance and Sexual Activity   Alcohol use: Yes   Drug use: Yes    Frequency: 1.0 times per week    Types: Marijuana    Comment: marijuana at midnight last night; daily   Sexual activity: Yes  Other Topics Concern   Not on file  Social History Narrative   Not on file   Social Determinants of Health   Financial Resource Strain: Not on file  Food Insecurity: Not on file  Transportation Needs: Not on file  Physical Activity: Not on file  Stress: Not on file  Social Connections: Not on file    Allergies  Allergen Reactions   Lactose Intolerance (Gi) Nausea And Vomiting     Current Outpatient Medications:    acetaminophen (TYLENOL) 325 MG tablet, Take 1 tablet (325 mg total) by mouth every 4 (four) hours as needed for mild pain., Disp: 100 tablet, Rfl: 2   aspirin EC 81 MG EC tablet, Take 1 tablet (81 mg total) by mouth daily. Swallow whole., Disp: 30 tablet, Rfl: 11   atorvastatin (LIPITOR) 10 MG tablet, Take 1 tablet (10 mg total) by mouth daily., Disp: 30 tablet, Rfl: 0   bumetanide (BUMEX) 0.5 MG tablet, Take 1 tablet (0.5 mg total) by mouth as needed (0.5mg  po qday prn basis (take if weight increasing more than 1 pound over 24hrs or 3 pounds over 7 days).)., Disp: 30 tablet, Rfl: 11   clonazePAM (KLONOPIN) 0.5 MG tablet, Take 1 tablet (0.5 mg total) by mouth 3 (three) times daily., Disp: 90 tablet, Rfl: 2   colchicine 0.6 MG tablet, Take 0.6 mg by mouth daily., Disp: , Rfl:    cyanocobalamin 1000 MCG tablet, Take 1 tablet (1,000 mcg total) by mouth daily., Disp: 100 tablet, Rfl: 2   diclofenac Sodium (VOLTAREN) 1 % GEL, Apply 2 g topically 4 (four) times daily., Disp: 300 g, Rfl: 0   dolutegravir-lamiVUDine (DOVATO) 50-300 MG tablet, Take 1 tablet by mouth  daily., Disp: 30 tablet, Rfl: 11   famotidine (PEPCID) 40 MG tablet, Take 1 tablet (40 mg total) by mouth daily., Disp: 30 tablet, Rfl: 3   ferrous sulfate 325 (65 FE) MG EC tablet, Take 1 tablet by mouth daily., Disp: , Rfl:    gabapentin (NEURONTIN) 300 MG capsule, Take 2 capsules (600 mg total) by mouth 3 (three) times daily. (Patient taking differently: Take 600 mg by mouth 4 (four) times daily.), Disp: 180 capsule, Rfl: 0   insulin glargine (LANTUS SOLOSTAR) 100 UNIT/ML Solostar Pen, Inject 5 Units into the skin daily., Disp: 15 mL, Rfl: 0  Insulin Pen Needle 32G X 4 MM MISC, use as directed, Disp: 100 each, Rfl: 0   labetalol (NORMODYNE) 100 MG tablet, Take 1 tablet (100 mg total) by mouth 3 (three) times daily., Disp: 90 tablet, Rfl: 0   lidocaine (LIDODERM) 5 %, Place 2 patches onto the skin daily. Remove & Discard patch within 12 hours or as directed by MD, Disp: 30 patch, Rfl: 0   magnesium oxide (MAG-OX) 400 MG tablet, Take 1 tablet (400 mg total) by mouth 2 (two) times daily., Disp: 60 tablet, Rfl: 0   methocarbamol (ROBAXIN) 500 MG tablet, Take 1 tablet (500 mg total) by mouth every 6 (six) hours as needed for muscle spasms., Disp: 120 tablet, Rfl: 0   omeprazole (PRILOSEC) 20 MG capsule, Take 20 mg by mouth daily., Disp: , Rfl:    Oxycodone HCl 10 MG TABS, Take 1 tablet (10 mg total) by mouth every 4 (four) hours as needed. MAX 5 TABS PER DAY AS NEEDED FOR SEVERE PAIN, Disp: 25 tablet, Rfl: 0   polyethylene glycol (MIRALAX / GLYCOLAX) 17 g packet, Take 17 g by mouth daily as needed for moderate constipation. (Patient not taking: No sig reported), Disp: , Rfl:    tamsulosin (FLOMAX) 0.4 MG CAPS capsule, Take 1 capsule (0.4 mg total) by mouth daily after supper., Disp: 30 capsule, Rfl: 0   traZODone (DESYREL) 50 MG tablet, Take 50 mg by mouth at bedtime., Disp: , Rfl:    Review of Systems  Constitutional:  Positive for fatigue and unexpected weight change. Negative for activity change,  appetite change, chills, diaphoresis and fever.  HENT:  Negative for congestion, rhinorrhea, sinus pressure, sneezing, sore throat and trouble swallowing.   Eyes:  Negative for photophobia and visual disturbance.  Respiratory:  Negative for cough, chest tightness, shortness of breath, wheezing and stridor.   Cardiovascular:  Negative for chest pain, palpitations and leg swelling.  Gastrointestinal:  Negative for abdominal distention, abdominal pain, anal bleeding, blood in stool, constipation, diarrhea, nausea and vomiting.  Genitourinary:  Negative for difficulty urinating, dysuria, flank pain and hematuria.  Musculoskeletal:  Negative for arthralgias, back pain, gait problem, joint swelling and myalgias.  Skin:  Negative for color change, pallor, rash and wound.  Neurological:  Negative for dizziness, tremors, weakness and light-headedness.  Hematological:  Negative for adenopathy. Does not bruise/bleed easily.  Psychiatric/Behavioral:  Negative for agitation, behavioral problems, confusion, decreased concentration, dysphoric mood and sleep disturbance.       Objective:   Physical Exam Constitutional:      Appearance: Normal appearance. He is well-developed. He is obese.  HENT:     Head: Normocephalic and atraumatic.  Eyes:     Conjunctiva/sclera: Conjunctivae normal.  Cardiovascular:     Rate and Rhythm: Normal rate and regular rhythm.     Heart sounds: No murmur heard.   No friction rub. No gallop.  Pulmonary:     Effort: Pulmonary effort is normal. No respiratory distress.     Breath sounds: No stridor. No wheezing or rhonchi.  Abdominal:     General: There is no distension.     Palpations: Abdomen is soft.  Musculoskeletal:        General: No tenderness. Normal range of motion.     Cervical back: Normal range of motion and neck supple.  Skin:    General: Skin is warm and dry.     Coloration: Skin is not pale.     Findings: No erythema or rash.  Neurological:  General:  No focal deficit present.     Mental Status: He is alert and oriented to person, place, and time.  Psychiatric:        Mood and Affect: Mood normal.        Behavior: Behavior normal.        Thought Content: Thought content normal.        Judgment: Judgment normal.          Assessment & Plan:    HIV disease:  His VL was 50 on 07/22/2021  Lab Results  Component Value Date   HIV1RNAQUANT 50 (H) 07/22/2021     I have reviewed his most recent CD4 from same date and it is 30  Lab Results  Component Value Date   CD4TABS 260 (L) 07/22/2021   CD4TABS 145 (L) 06/24/2021   CD4TABS 66 (L) 04/21/2021    I will continue his Dovato and Bactrim for PCP prophylaxis  HTN: much much better controlled  Volume overload: his edema is markedly improved  Today's Vitals   08/19/21 1204  BP: (!) 133/91  Pulse: 85  Temp: 98.6 F (37 C)  Weight: 271 lb (122.9 kg)  Height: 5\' 11"  (1.803 m)  PainSc: 6   PainLoc: Leg   Body mass index is 37.8 kg/m. Chronic kidney disease: Creatinine is stable on labs reviewed from November 17 in fact improved   CKD: creatinine from October reviewed and stable in context of diuresis in the inpatient world  CMP Latest Ref Rng & Units 07/22/2021 07/04/2021 07/03/2021  Glucose 65 - 99 mg/dL 152(H) 176(H) 196(H)  BUN 7 - 25 mg/dL 37(H) 33(H) 35(H)  Creatinine 0.60 - 1.29 mg/dL 1.78(H) 1.92(H) 1.84(H)  Sodium 135 - 146 mmol/L 137 136 138  Potassium 3.5 - 5.3 mmol/L 5.2 4.1 3.8  Chloride 98 - 110 mmol/L 98 103 101  CO2 20 - 32 mmol/L 30 23 26   Calcium 8.6 - 10.3 mg/dL 10.0 9.0 9.0  Total Protein 6.1 - 8.1 g/dL 7.5 - -  Total Bilirubin 0.2 - 1.2 mg/dL 0.3 - -  Alkaline Phos 38 - 126 U/L - - -  AST 10 - 40 U/L 14 - -  ALT 9 - 46 U/L 16 - -   DM: on insulin now  Weight gain: We will continue with his current integrase strand transfer him to base regimen can consider options for changing therapy but more importantly changes in diet and  exercise.  Seen counseling recommend he get updated bivalent COVID vaccine  I spent  42 minutes with the patient including than 50% of the time in face to face counseling of the patient HIV disease history of PCP pneumonia, hypertension diabetes mellitus volume overload , CKD  with review of medical records in preparation for the visit and during the visit and in coordination of his care.

## 2021-09-09 ENCOUNTER — Ambulatory Visit (INDEPENDENT_AMBULATORY_CARE_PROVIDER_SITE_OTHER): Payer: Medicaid Other | Admitting: Interventional Cardiology

## 2021-09-09 ENCOUNTER — Encounter: Payer: Self-pay | Admitting: Interventional Cardiology

## 2021-09-09 ENCOUNTER — Other Ambulatory Visit: Payer: Self-pay

## 2021-09-09 ENCOUNTER — Other Ambulatory Visit (HOSPITAL_COMMUNITY): Payer: Self-pay

## 2021-09-09 VITALS — BP 120/72 | HR 94 | Ht 70.0 in | Wt 275.0 lb

## 2021-09-09 DIAGNOSIS — M79672 Pain in left foot: Secondary | ICD-10-CM

## 2021-09-09 DIAGNOSIS — Z794 Long term (current) use of insulin: Secondary | ICD-10-CM

## 2021-09-09 DIAGNOSIS — I5043 Acute on chronic combined systolic (congestive) and diastolic (congestive) heart failure: Secondary | ICD-10-CM | POA: Diagnosis not present

## 2021-09-09 DIAGNOSIS — M79671 Pain in right foot: Secondary | ICD-10-CM

## 2021-09-09 DIAGNOSIS — I429 Cardiomyopathy, unspecified: Secondary | ICD-10-CM | POA: Diagnosis not present

## 2021-09-09 DIAGNOSIS — M25473 Effusion, unspecified ankle: Secondary | ICD-10-CM

## 2021-09-09 DIAGNOSIS — E1159 Type 2 diabetes mellitus with other circulatory complications: Secondary | ICD-10-CM | POA: Diagnosis not present

## 2021-09-09 NOTE — Patient Instructions (Signed)
Medication Instructions:  Your physician recommends that you continue on your current medications as directed. Please refer to the Current Medication list given to you today. May take extra Bumex tablet a couple times per week if needed *If you need a refill on your cardiac medications before your next appointment, please call your pharmacy*   Lab Work: none If you have labs (blood work) drawn today and your tests are completely normal, you will receive your results only by: [place] (if you have MyChart) OR A paper copy in the mail If you have any lab test that is abnormal or we need to change your treatment, we will call you to review the results.   Testing/Procedures: Your physician has requested that you have an echocardiogram. Echocardiography is a painless test that uses sound waves to create images of your heart. It provides your doctor with information about the size and shape of your heart and how well your hearts chambers and valves are working. This procedure takes approximately one hour. There are no restrictions for this procedure. To be done in February 2023   Follow-Up: At [facility], you and your health needs are our priority.  As part of our continuing mission to provide you with exceptional heart care, we have created designated Provider Care Teams.  These Care Teams include your primary Cardiologist (physician) and Advanced Practice Providers (APPs -  Physician Assistants and Nurse Practitioners) who all work together to provide you with the care you need, when you need it.  We recommend signing up for the patient portal called "MyChart".  Sign up information is provided on this After Visit Summary.  MyChart is used to connect with patients for Virtual Visits (Telemedicine).  Patients are able to view lab/test results, encounter notes, upcoming appointments, etc.  Non-urgent messages can be sent to your provider as well.   To learn more about what you can do with  MyChart, go to [email or web address].    Your next appointment:   March 2023  The format for your next appointment:   In Person  Provider:   Larae Grooms, MD  Or APP   Other Instructions

## 2021-09-09 NOTE — Progress Notes (Signed)
Cardiology Office Note   Date:  09/09/2021   ID:  Bruce Hunter, DOB 1975/12/29, MRN [id number]  PCP:  Elwyn Reach, MD    Chief Complaint  Patient presents with   Establish Care   Chronic diastolic heart failure  Wt Readings from Last 3 Encounters:  09/09/21 275 lb (124.7 kg)  08/19/21 271 lb (122.9 kg)  07/04/21 271 lb 12.8 oz (123.3 kg)       History of Present Illness: Bruce Hunter is a 46 y.o. male who is being seen today for the evaluation of chronic diastolic heart failure at the request of Tommy Medal, Lavell Islam, MD.   Records show: He has " HIV and AIDS.  He recently had a protracted  hospital stay from March 30, 2021 till the very end of August 2022.  Admitted with respiratory failure that required mechanical intubation and ventilation and ultimately underwent tracheostomy.  He completed a treatment course for PCP pneumonia though PCP was never proven he did have evidence of deteriorating few days during his stay at [facility] when he was off anti-PCP medications.   Developed septic physiology with shock and acute renal failure requiring CVVHD briefly.   Fortunately was able to come off the ventilator and his renal function recovered completely.  His hospital course was further complicated by diffuse gout that required a corticosteroid taper.  He also was diagnosed with diabetes mellitus while in the hospital and now is on insulin.   Bruce Hunter was quite volume overloaded on several occasions when Ii saw him as an outpatient and edematous with a proBNP over thousand.  Ultimately Bruce Hunter was admitted the hospital and was treated with diuretics for his volume overload as well as for COPD exacerbation with steroids "  Echo in October 22 showed: "Left ventricular ejection fraction, by estimation, is 40 to 45%. Left  ventricular ejection fraction by 2D MOD biplane is 47.2 %. The left  ventricle has mildly decreased function. The left ventricle demonstrates  global  hypokinesis. The left  ventricular internal cavity size was mildly dilated. There is mild  concentric left ventricular hypertrophy. Left ventricular diastolic  parameters are consistent with Grade II diastolic dysfunction  (pseudonormalization). Elevated left ventricular  end-diastolic pressure.   2. Right ventricular systolic function is normal. The right ventricular  size is normal. There is mildly elevated pulmonary artery systolic  pressure. The estimated right ventricular systolic pressure is 32.2 mmHg.   3. The mitral valve is normal in structure. Trivial mitral valve  regurgitation. No evidence of mitral stenosis.   4. The aortic valve is normal in structure. Aortic valve regurgitation is  not visualized. No aortic stenosis is present.   5. The inferior vena cava is dilated in size with <50% respiratory  variability, suggesting right atrial pressure of 15 mmHg.   Conclusion(s)/Recommendation(s): Findings consistent with non-ischemic  cardiomyopathy. "  EF had been normal prior to his long hospital stay in July and August.  Denies : Chest pain. Dizziness.  Nitroglycerin use. Orthopnea. Palpitations. Paroxysmal nocturnal dyspnea. Shortness of breath. Syncope.    Persistent ankle edema.   Past Medical History:  Diagnosis Date   Anxiety 05/07/2021   Asthma    Diabetes mellitus without complication ([place])    Gout    Hyperlipidemia    Hypertension    Hypertensive urgency 06/24/2021   Obesity    Obesity 06/24/2021   Pulmonary edema 06/24/2021    Past Surgical History:  Procedure Laterality Date  BIOPSY  02/15/2021   Procedure: BIOPSY;  Surgeon: Ronnette Juniper, MD;  Location: [facility];  Service: Gastroenterology;;   COLONOSCOPY WITH PROPOFOL N/A 02/15/2021   Procedure: COLONOSCOPY WITH PROPOFOL;  Surgeon: Ronnette Juniper, MD;  Location: [facility];  Service: Gastroenterology;  Laterality: N/A;   ESOPHAGOGASTRODUODENOSCOPY (EGD) WITH PROPOFOL N/A 02/15/2021   Procedure:  ESOPHAGOGASTRODUODENOSCOPY (EGD) WITH PROPOFOL;  Surgeon: Ronnette Juniper, MD;  Location: [facility];  Service: Gastroenterology;  Laterality: N/A;   GANGLION CYST EXCISION Right 02/26/2020   Procedure: EXCISION TOPHUS RIGHT FOOT;  Surgeon: Evelina Bucy, DPM;  Location: [facility];  Service: Podiatry;  Laterality: Right;   NO PAST SURGERIES       Current Outpatient Medications  Medication Sig Dispense Refill   acetaminophen (TYLENOL) 325 MG tablet Take 1 tablet (325 mg total) by mouth every 4 (four) hours as needed for mild pain. 100 tablet 2   aspirin EC 81 MG EC tablet Take 1 tablet (81 mg total) by mouth daily. Swallow whole. 30 tablet 11   atorvastatin (LIPITOR) 10 MG tablet Take 1 tablet (10 mg total) by mouth daily. 30 tablet 0   bumetanide (BUMEX) 0.5 MG tablet Take 1 tablet (0.5 mg total) by mouth as needed (0.5mg  po qday prn basis (take if weight increasing more than 1 pound over 24hrs or 3 pounds over 7 days).). 30 tablet 11   clonazePAM (KLONOPIN) 0.5 MG tablet Take 1 tablet (0.5 mg total) by mouth 3 (three) times daily. 90 tablet 2   colchicine 0.6 MG tablet Take 0.6 mg by mouth daily.     cyanocobalamin 1000 MCG tablet Take 1 tablet (1,000 mcg total) by mouth daily. 100 tablet 2   dolutegravir-lamiVUDine (DOVATO) 50-300 MG tablet Take 1 tablet by mouth daily. 30 tablet 11   famotidine (PEPCID) 40 MG tablet Take 1 tablet (40 mg total) by mouth daily. 30 tablet 3   ferrous sulfate 325 (65 FE) MG EC tablet Take 1 tablet by mouth daily.     insulin glargine (LANTUS SOLOSTAR) 100 UNIT/ML Solostar Pen Inject 5 Units into the skin daily. 15 mL 0   Insulin Pen Needle 32G X 4 MM MISC use as directed 100 each 0   labetalol (NORMODYNE) 100 MG tablet Take 1 tablet (100 mg total) by mouth 3 (three) times daily. 90 tablet 0   lidocaine (LIDODERM) 5 % Place 2 patches onto the skin daily. Remove & Discard patch within 12 hours or as directed by MD 30 patch 0   magnesium oxide  (MAG-OX) 400 MG tablet Take 1 tablet (400 mg total) by mouth 2 (two) times daily. 60 tablet 0   methocarbamol (ROBAXIN) 500 MG tablet Take 1 tablet (500 mg total) by mouth every 6 (six) hours as needed for muscle spasms. 120 tablet 0   Oxycodone HCl 10 MG TABS Take 1 tablet (10 mg total) by mouth every 4 (four) hours as needed. MAX 5 TABS PER DAY AS NEEDED FOR SEVERE PAIN 25 tablet 0   tamsulosin (FLOMAX) 0.4 MG CAPS capsule Take 1 capsule (0.4 mg total) by mouth daily after supper. 30 capsule 0   traZODone (DESYREL) 50 MG tablet Take 50 mg by mouth at bedtime.     gabapentin (NEURONTIN) 300 MG capsule Take 2 capsules (600 mg total) by mouth 3 (three) times daily. (Patient not taking: Reported on 09/09/2021) 180 capsule 0   polyethylene glycol (MIRALAX / GLYCOLAX) 17 g packet Take 17 g by mouth daily as needed  for moderate constipation. (Patient not taking: Reported on 09/09/2021)     No current facility-administered medications for this visit.    Allergies:   Lactose intolerance (gi)    Social History:  The patient  reports that he has been smoking cigarettes. He has been smoking an average of 1 pack per day. He has never used smokeless tobacco. He reports current alcohol use. He reports current drug use. Frequency: 1.00 time per week. Drug: Marijuana.   Family History:  The patient's family history includes Asthma in his mother; Cancer in his maternal grandmother and mother; Diabetes in his brother, maternal grandmother, mother, and another family member; Obesity in his brother, mother, and sister.    ROS:  Please see the history of present illness.   Otherwise, review of systems are positive for feet hurt- oxycodone is no longer working   All other systems are reviewed and negative.    PHYSICAL EXAM: VS:  BP 120/72    Pulse 94    Ht 5\' 10"  (1.778 m)    Wt 275 lb (124.7 kg)    SpO2 96%    BMI 39.46 kg/m  , BMI Body mass index is 39.46 kg/m. GEN: Well nourished, well developed, in no acute  distress, sleepy HEENT: normal Neck: no JVD, carotid bruits, or masses Cardiac: RRR; no murmurs, rubs, or gallops,bilateral ankle edema -tender to touch Respiratory:  clear to auscultation bilaterally, normal work of breathing GI: soft, nontender, nondistended, + BS MS: no deformity or atrophy Skin: warm and dry, no rash Neuro:  Strength and sensation are intact, slow gait Psych: euthymic mood, full affect, sleepy   EKG:   The ekg ordered in 06/2021 demonstrates NSR   Recent Labs: 02/12/2021: TSH 0.430 05/03/2021: Magnesium 1.5 06/29/2021: B Natriuretic Peptide 151.4 07/22/2021: ALT 16; BUN 37; Creat 1.78; Hemoglobin 12.3; Platelets 300; Potassium 5.2; Pro B Natriuretic peptide (BNP) 735; Sodium 137   Lipid Panel    Component Value Date/Time   CHOL 219 (H) 07/01/2021 0236   TRIG 113 07/01/2021 0236   HDL 67 07/01/2021 0236   CHOLHDL 3.3 07/01/2021 0236   VLDL 23 07/01/2021 0236   LDLCALC 129 (H) 07/01/2021 0236     Other studies Reviewed: Additional studies/ records that were reviewed today with results demonstrating: Hospital records reviewed, labs reviewed.   ASSESSMENT AND PLAN:  Acute on Chronic combined systolic/diastolic heart failure: I suspect his LV dysfunction will be transient from his severe systemic illness.  Will need to check with pharmacy as to what heart failure medicines he could tolerate in the setting of his current HIV medicines.  Decrease salt intake.  Continue beta-blocker.  Check limited echo and March to further evaluate LVEF.  Hopefully, this would have improved after more time from his severe illness. Whole food, plant-based diet.  Avoid processed foods.  Low-salt diet. Foot pain: He requested additional pain meds.  I asked him to f/u with his regular MD.  Hopefully increasing diuresis will help resolve the swelling and subsequently the pain.  Elevate legs so ankles are above the level of his heart.  Okay to take additional Bumex 1-2 times a week to  see if this helps with the volume overload noted in his ankles. DM: A1c 7.4.  Continue follow-up with his primary care doctor.  Associated renal insufficiency as well.   Current medicines are reviewed at length with the patient today.  The patient concerns regarding his medicines were addressed.  The following changes have been made:  No change  Labs/ tests ordered today include:  No orders of the defined types were placed in this encounter.   Recommend 150 minutes/week of aerobic exercise Low fat, low carb, high fiber diet recommended  Disposition:   FU in 2 months   Signed, Larae Grooms, MD  09/09/2021 2:57 PM    [facility] [place], [place], [place]  [address] Phone: [phone]; Fax: [phone]

## 2021-09-13 ENCOUNTER — Other Ambulatory Visit (HOSPITAL_COMMUNITY): Payer: Self-pay

## 2021-09-16 ENCOUNTER — Encounter: Payer: Medicaid Other | Admitting: Physical Medicine and Rehabilitation

## 2021-09-20 ENCOUNTER — Other Ambulatory Visit (HOSPITAL_COMMUNITY): Payer: Self-pay

## 2021-09-20 ENCOUNTER — Other Ambulatory Visit: Payer: Self-pay | Admitting: Infectious Disease

## 2021-09-21 ENCOUNTER — Other Ambulatory Visit (HOSPITAL_COMMUNITY): Payer: Self-pay

## 2021-09-21 MED ORDER — SULFAMETHOXAZOLE-TRIMETHOPRIM 800-160 MG PO TABS
1.0000 | ORAL_TABLET | Freq: Every day | ORAL | 3 refills | Status: DC
  Filled 2021-09-21: qty 30, 30d supply, fill #0
  Filled 2021-10-20: qty 30, 30d supply, fill #1

## 2021-09-27 ENCOUNTER — Other Ambulatory Visit (HOSPITAL_COMMUNITY): Payer: Medicaid Other

## 2021-10-04 ENCOUNTER — Other Ambulatory Visit (HOSPITAL_COMMUNITY): Payer: Self-pay

## 2021-10-04 ENCOUNTER — Other Ambulatory Visit: Payer: Self-pay | Admitting: Physical Medicine and Rehabilitation

## 2021-10-05 ENCOUNTER — Other Ambulatory Visit (HOSPITAL_COMMUNITY): Payer: Self-pay

## 2021-10-05 ENCOUNTER — Other Ambulatory Visit: Payer: Self-pay | Admitting: Physical Medicine and Rehabilitation

## 2021-10-06 ENCOUNTER — Other Ambulatory Visit (HOSPITAL_COMMUNITY): Payer: Self-pay

## 2021-10-08 ENCOUNTER — Ambulatory Visit: Payer: Medicaid Other | Admitting: Podiatry

## 2021-10-19 ENCOUNTER — Other Ambulatory Visit (HOSPITAL_COMMUNITY): Payer: Self-pay

## 2021-10-19 ENCOUNTER — Encounter: Payer: Self-pay | Admitting: Podiatry

## 2021-10-19 ENCOUNTER — Ambulatory Visit (INDEPENDENT_AMBULATORY_CARE_PROVIDER_SITE_OTHER): Payer: Medicaid Other

## 2021-10-19 ENCOUNTER — Other Ambulatory Visit: Payer: Self-pay

## 2021-10-19 ENCOUNTER — Ambulatory Visit (INDEPENDENT_AMBULATORY_CARE_PROVIDER_SITE_OTHER): Payer: Medicaid Other | Admitting: Podiatry

## 2021-10-19 DIAGNOSIS — E1169 Type 2 diabetes mellitus with other specified complication: Secondary | ICD-10-CM | POA: Diagnosis not present

## 2021-10-19 DIAGNOSIS — G8929 Other chronic pain: Secondary | ICD-10-CM

## 2021-10-19 DIAGNOSIS — M79672 Pain in left foot: Secondary | ICD-10-CM

## 2021-10-19 DIAGNOSIS — M109 Gout, unspecified: Secondary | ICD-10-CM

## 2021-10-19 DIAGNOSIS — B351 Tinea unguium: Secondary | ICD-10-CM

## 2021-10-19 DIAGNOSIS — E1142 Type 2 diabetes mellitus with diabetic polyneuropathy: Secondary | ICD-10-CM | POA: Diagnosis not present

## 2021-10-19 NOTE — Progress Notes (Signed)
°  Subjective:  Patient ID: Bruce Hunter, male    DOB: 1975/09/16,  MRN: [id number]  Chief Complaint  Patient presents with   Nail Problem    Trim nails and I am here to get a diabetic foot exam    46 y.o. male presents with the above complaint. History confirmed with patient. Also complains of generalized pain to both feet - not a new issue. Was previously seen at rheumatology but not treated with Krystexxa  Objective:  Physical Exam: warm, good capillary refill, nail exam onychomycosis of the toenails, no trophic changes or ulcerative lesions. 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  Hgb A1c MFr Bld  Date Value Ref Range Status  07/22/2021 7.4 (H) <5.7 % of total Hgb Final    Comment:    For someone without known diabetes, a hemoglobin A1c value of 6.5% or greater indicates that they may have  diabetes and this should be confirmed with a follow-up  test. . For someone with known diabetes, a value <7% indicates  that their diabetes is well controlled and a value  greater than or equal to 7% indicates suboptimal  control. A1c targets should be individualized based on  duration of diabetes, age, comorbid conditions, and  other considerations. . Currently, no consensus exists regarding use of hemoglobin A1c for diagnosis of diabetes for children. .     No images are attached to the encounter.  Assessment:   1. Chronic foot pain, left   2. Onychomycosis of multiple toenails with type 2 diabetes mellitus and peripheral neuropathy ([place])   3. Gout of right foot, unspecified cause, unspecified chronicity      Plan:  Patient was evaluated and treated and all questions answered.  Onychomycosis -Patient is diabetic with a qualifying condition for at risk foot care.  Procedure: Nail Debridement Type of Debridement: manual, sharp  debridement. Instrumentation: Nail nipper, rotary burr. Number of Nails: 10 Disposition: Patient tolerated well without iatrogenic injury.  Gout Bilateral feet -XR taken and reviewed. 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   Return in about 1 month (around 11/16/2021) for gout f/u.

## 2021-10-19 NOTE — Patient Instructions (Signed)

## 2021-10-19 NOTE — Progress Notes (Signed)
Chief complaint: Bruce Hunter is complaining of pain in his feet bilaterally where he is suffering from gout flare patient ID: Bruce Hunter, male    DOB: [birth year], 46 y.o.   MRN: [id number]  HPI  Bruce Hunter is a 46 year old black man who was recently diagnosed with HIV and AIDS.  He recently had a protracted  hospital stay from March 30, 2021 till the very end of August 2022.  Admitted with respiratory failure that required mechanical intubation and ventilation and ultimately underwent tracheostomy.  He completed a treatment course for PCP pneumonia though PCP was never proven he did have evidence of deteriorating few days during his stay at [facility] when he was off anti-PCP medications.  Developed septic physiology with shock and acute renal failure requiring CVVHD briefly.  Fortunately was able to come off the ventilator and his renal function recovered completely.  His hospital course was further complicated by diffuse gout that required a corticosteroid taper.  He also was diagnosed with diabetes mellitus while in the hospital and now is on insulin.  He ultimately is discharged in the inpatient unit to inpatient rehab where he was further taken care of.  My partners Dr. Gale Journey and Baxter Flattery saw him again when he had recurrent fevers.  F AFB cultures were sent as well as testing for gonorrhea chlamydia CT chest abdomen pelvis.    He was started back on corticosteroids with resolution of his fevers.  He now finish steroids for gout and is having intermittent fevers again at times.  I suspect the fevers were  due to IRIS.  Bruce Hunter was quite volume overloaded on several occasions when [facility] saw him as an outpatient and edematous with a proBNP over thousand.  Ultimately Bruce Hunter was admitted the hospital and was treated with diuretics for his volume overload as well as for COPD exacerbation with steroids  He was dramatically improved while in the hospital.  Bruce Hunter is suffering from pain in his feet  bilaterally and has seen podiatry yesterday.  He is taking colchicine daily but his pain is not well controlled he is applying to applying Tiger balm to his feet with patches.  He has had an eye exam and he is very proactive about checking on all of the vaccines and other things that he needs using MyChart.  Past Medical History:  Diagnosis Date   Anxiety 05/07/2021   Asthma    Diabetes mellitus without complication ([place])    Gout    Hyperlipidemia    Hypertension    Hypertensive urgency 06/24/2021   Obesity    Obesity 06/24/2021   Pulmonary edema 06/24/2021    Past Surgical History:  Procedure Laterality Date   BIOPSY  02/15/2021   Procedure: BIOPSY;  Surgeon: Ronnette Juniper, MD;  Location: [facility];  Service: Gastroenterology;;   COLONOSCOPY WITH PROPOFOL N/A 02/15/2021   Procedure: COLONOSCOPY WITH PROPOFOL;  Surgeon: Ronnette Juniper, MD;  Location: [facility];  Service: Gastroenterology;  Laterality: N/A;   ESOPHAGOGASTRODUODENOSCOPY (EGD) WITH PROPOFOL N/A 02/15/2021   Procedure: ESOPHAGOGASTRODUODENOSCOPY (EGD) WITH PROPOFOL;  Surgeon: Ronnette Juniper, MD;  Location: [facility];  Service: Gastroenterology;  Laterality: N/A;   GANGLION CYST EXCISION Right 02/26/2020   Procedure: EXCISION TOPHUS RIGHT FOOT;  Surgeon: Evelina Bucy, DPM;  Location: [facility];  Service: Podiatry;  Laterality: Right;   NO PAST SURGERIES      Family History  Problem Relation Age of Onset   Asthma Mother    Cancer Mother  Diabetes Mother    Obesity Mother    Obesity Sister    Diabetes Brother    Obesity Brother    Cancer Maternal Grandmother    Diabetes Maternal Grandmother    Diabetes Other       Social History   Socioeconomic History   Marital status: Single    Spouse name: Not on file   Number of children: Not on file   Years of education: Not on file   Highest education level: Not on file  Occupational History   Not on file  Tobacco Use   Smoking status: Every  Day    Packs/day: 1.00    Types: Cigarettes   Smokeless tobacco: Never  Substance and Sexual Activity   Alcohol use: Yes   Drug use: Yes    Frequency: 1.0 times per week    Types: Marijuana    Comment: marijuana at midnight last night; daily   Sexual activity: Yes  Other Topics Concern   Not on file  Social History Narrative   Not on file   Social Determinants of Health   Financial Resource Strain: Not on file  Food Insecurity: Not on file  Transportation Needs: Not on file  Physical Activity: Not on file  Stress: Not on file  Social Connections: Not on file    Allergies  Allergen Reactions   Lactose Intolerance (Gi) Nausea And Vomiting     Current Outpatient Medications:    acetaminophen (TYLENOL) 325 MG tablet, Take 1 tablet (325 mg total) by mouth every 4 (four) hours as needed for mild pain., Disp: 100 tablet, Rfl: 2   aspirin EC 81 MG EC tablet, Take 1 tablet (81 mg total) by mouth daily. Swallow whole., Disp: 30 tablet, Rfl: 11   atorvastatin (LIPITOR) 10 MG tablet, Take 1 tablet (10 mg total) by mouth daily., Disp: 30 tablet, Rfl: 0   bumetanide (BUMEX) 0.5 MG tablet, Take 1 tablet (0.5 mg total) by mouth as needed (0.5mg  po qday prn basis (take if weight increasing more than 1 pound over 24hrs or 3 pounds over 7 days).)., Disp: 30 tablet, Rfl: 11   clonazePAM (KLONOPIN) 0.5 MG tablet, Take 1 tablet (0.5 mg total) by mouth 3 (three) times daily., Disp: 90 tablet, Rfl: 2   colchicine 0.6 MG tablet, Take 0.6 mg by mouth daily., Disp: , Rfl:    cyanocobalamin 1000 MCG tablet, Take 1 tablet (1,000 mcg total) by mouth daily., Disp: 100 tablet, Rfl: 2   dolutegravir-lamiVUDine (DOVATO) 50-300 MG tablet, Take 1 tablet by mouth daily., Disp: 30 tablet, Rfl: 11   famotidine (PEPCID) 40 MG tablet, TAKE 1 TABLET(40 MG) BY MOUTH DAILY, Disp: 30 tablet, Rfl: 3   FARXIGA 10 MG TABS tablet, Take 10 mg by mouth daily., Disp: , Rfl:    ferrous sulfate 325 (65 FE) MG EC tablet, Take 1  tablet by mouth daily., Disp: , Rfl:    fluticasone furoate-vilanterol (BREO ELLIPTA) 100-25 MCG/ACT AEPB, Inhale into the lungs., Disp: , Rfl:    gabapentin (NEURONTIN) 300 MG capsule, Take 2 capsules (600 mg total) by mouth 3 (three) times daily. (Patient not taking: Reported on 09/09/2021), Disp: 180 capsule, Rfl: 0   insulin glargine (LANTUS SOLOSTAR) 100 UNIT/ML Solostar Pen, Inject 5 Units into the skin daily., Disp: 15 mL, Rfl: 0   Insulin Pen Needle 32G X 4 MM MISC, use as directed, Disp: 100 each, Rfl: 0   labetalol (NORMODYNE) 100 MG tablet, Take 1 tablet (100 mg total)  by mouth 3 (three) times daily., Disp: 90 tablet, Rfl: 0   lidocaine (LIDODERM) 5 %, Place 2 patches onto the skin daily. Remove & Discard patch within 12 hours or as directed by MD, Disp: 30 patch, Rfl: 0   magnesium oxide (MAG-OX) 400 MG tablet, Take 1 tablet (400 mg total) by mouth 2 (two) times daily., Disp: 60 tablet, Rfl: 0   methocarbamol (ROBAXIN) 500 MG tablet, Take 1 tablet (500 mg total) by mouth every 6 (six) hours as needed for muscle spasms., Disp: 120 tablet, Rfl: 0   omeprazole (PRILOSEC) 20 MG capsule, Take 20 mg by mouth daily., Disp: , Rfl:    Oxycodone HCl 10 MG TABS, Take 1 tablet (10 mg total) by mouth every 4 (four) hours as needed. MAX 5 TABS PER DAY AS NEEDED FOR SEVERE PAIN, Disp: 25 tablet, Rfl: 0   polyethylene glycol (MIRALAX / GLYCOLAX) 17 g packet, Take 17 g by mouth daily as needed for moderate constipation. (Patient not taking: Reported on 09/09/2021), Disp: , Rfl:    sulfamethoxazole-trimethoprim (BACTRIM DS) 800-160 MG tablet, Take 1 tablet by mouth daily., Disp: 30 tablet, Rfl: 3   SYMBICORT 160-4.5 MCG/ACT inhaler, Inhale into the lungs., Disp: , Rfl:    tamsulosin (FLOMAX) 0.4 MG CAPS capsule, Take 1 capsule (0.4 mg total) by mouth daily after supper., Disp: 30 capsule, Rfl: 0   traZODone (DESYREL) 50 MG tablet, Take 50 mg by mouth at bedtime., Disp: , Rfl:    Review of Systems   Constitutional:  Positive for unexpected weight change. Negative for activity change, appetite change, chills, diaphoresis, fatigue and fever.  HENT:  Negative for congestion, rhinorrhea, sinus pressure, sneezing, sore throat and trouble swallowing.   Eyes:  Negative for photophobia and visual disturbance.  Respiratory:  Negative for cough, chest tightness, shortness of breath, wheezing and stridor.   Cardiovascular:  Negative for chest pain, palpitations and leg swelling.  Gastrointestinal:  Negative for abdominal distention, abdominal pain, anal bleeding, blood in stool, constipation, diarrhea, nausea and vomiting.  Genitourinary:  Negative for difficulty urinating, dysuria, flank pain and hematuria.  Musculoskeletal:  Positive for arthralgias. Negative for back pain, gait problem, joint swelling and myalgias.  Skin:  Negative for color change, pallor, rash and wound.  Neurological:  Negative for dizziness, tremors, weakness and light-headedness.  Hematological:  Negative for adenopathy. Does not bruise/bleed easily.  Psychiatric/Behavioral:  Negative for agitation, behavioral problems, confusion, decreased concentration, dysphoric mood and sleep disturbance.       Objective:   Physical Exam Constitutional:      Appearance: He is well-developed.  HENT:     Head: Normocephalic and atraumatic.  Eyes:     Conjunctiva/sclera: Conjunctivae normal.  Cardiovascular:     Rate and Rhythm: Normal rate and regular rhythm.  Pulmonary:     Effort: Pulmonary effort is normal. No respiratory distress.     Breath sounds: No wheezing.  Abdominal:     General: There is no distension.     Palpations: Abdomen is soft.  Musculoskeletal:        General: No tenderness. Normal range of motion.     Cervical back: Normal range of motion and neck supple.  Skin:    General: Skin is warm and dry.     Coloration: Skin is not pale.     Findings: No erythema or rash.  Neurological:     General: No focal  deficit present.     Mental Status: He is alert and oriented  to person, place, and time.  Psychiatric:        Mood and Affect: Mood normal.        Behavior: Behavior normal.        Thought Content: Thought content normal.        Judgment: Judgment normal.    Both feet are tender.  His edema is essentially gone now.      Assessment & Plan:   HIV disease:  I am checking a viral load CD4 count CMP and CBC with differential.  I am continue his Dovato and Bactrim.  I will see him back in 3 months time hopefully by that time we can stop his Bactrim.       Lab Results  Component Value Date   HIV1RNAQUANT 50 (H) 07/22/2021     I have reviewed his most recent CD4 from same date and it is 31  Lab Results  Component Value Date   CD4TABS 260 (L) 07/22/2021   CD4TABS 145 (L) 06/24/2021   CD4TABS 66 (L) 04/21/2021   CHF: He is dramatically better in terms of his lower extremity edema   There were no vitals filed for this visit.  There is no height or weight on file to calculate BMI. Chronic kidney disease: Stable rechecking CMP today  CMP Latest Ref Rng & Units 07/22/2021 07/04/2021 07/03/2021  Glucose 65 - 99 mg/dL 152(H) 176(H) 196(H)  BUN 7 - 25 mg/dL 37(H) 33(H) 35(H)  Creatinine 0.60 - 1.29 mg/dL 1.78(H) 1.92(H) 1.84(H)  Sodium 135 - 146 mmol/L 137 136 138  Potassium 3.5 - 5.3 mmol/L 5.2 4.1 3.8  Chloride 98 - 110 mmol/L 98 103 101  CO2 20 - 32 mmol/L 30 23 26   Calcium 8.6 - 10.3 mg/dL 10.0 9.0 9.0  Total Protein 6.1 - 8.1 g/dL 7.5 - -  Total Bilirubin 0.2 - 1.2 mg/dL 0.3 - -  Alkaline Phos 38 - 126 U/L - - -  AST 10 - 40 U/L 14 - -  ALT 9 - 46 U/L 16 - -   Diabetes mellitus: On insulin last A1c was checked in November.  Weight gain: We will continue current regimen for now could consider switching off of integrase strand transfer inhibitor but would prefer not to at this point in time.  Gout flare I am sent in prescription for colchicine so he could take  2 tablets and then another one in our and have enough to be able to take for flares during a month.

## 2021-10-20 ENCOUNTER — Ambulatory Visit (INDEPENDENT_AMBULATORY_CARE_PROVIDER_SITE_OTHER): Payer: Medicaid Other | Admitting: Infectious Disease

## 2021-10-20 ENCOUNTER — Other Ambulatory Visit: Payer: Self-pay

## 2021-10-20 ENCOUNTER — Other Ambulatory Visit (HOSPITAL_COMMUNITY): Payer: Self-pay

## 2021-10-20 ENCOUNTER — Encounter: Payer: Self-pay | Admitting: Infectious Disease

## 2021-10-20 VITALS — BP 136/85 | HR 87 | Temp 97.7°F | Wt 277.0 lb

## 2021-10-20 DIAGNOSIS — R6521 Severe sepsis with septic shock: Secondary | ICD-10-CM | POA: Diagnosis not present

## 2021-10-20 DIAGNOSIS — I5043 Acute on chronic combined systolic (congestive) and diastolic (congestive) heart failure: Secondary | ICD-10-CM

## 2021-10-20 DIAGNOSIS — N183 Chronic kidney disease, stage 3 unspecified: Secondary | ICD-10-CM | POA: Diagnosis not present

## 2021-10-20 DIAGNOSIS — B20 Human immunodeficiency virus [HIV] disease: Secondary | ICD-10-CM | POA: Diagnosis not present

## 2021-10-20 DIAGNOSIS — M10071 Idiopathic gout, right ankle and foot: Secondary | ICD-10-CM | POA: Diagnosis not present

## 2021-10-20 DIAGNOSIS — R635 Abnormal weight gain: Secondary | ICD-10-CM

## 2021-10-20 DIAGNOSIS — A419 Sepsis, unspecified organism: Secondary | ICD-10-CM | POA: Diagnosis not present

## 2021-10-20 DIAGNOSIS — F17210 Nicotine dependence, cigarettes, uncomplicated: Secondary | ICD-10-CM | POA: Diagnosis not present

## 2021-10-20 DIAGNOSIS — I10 Essential (primary) hypertension: Secondary | ICD-10-CM | POA: Diagnosis not present

## 2021-10-20 DIAGNOSIS — Z794 Long term (current) use of insulin: Secondary | ICD-10-CM | POA: Diagnosis not present

## 2021-10-20 DIAGNOSIS — E1165 Type 2 diabetes mellitus with hyperglycemia: Secondary | ICD-10-CM

## 2021-10-20 LAB — URIC ACID: Uric Acid, Serum: 10.4 mg/dL — ABNORMAL HIGH (ref 4.0–8.0)

## 2021-10-20 MED ORDER — DOVATO 50-300 MG PO TABS
1.0000 | ORAL_TABLET | Freq: Every day | ORAL | 11 refills | Status: DC
  Filled 2021-10-20 – 2021-11-12 (×2): qty 30, 30d supply, fill #0
  Filled 2021-12-07: qty 30, 30d supply, fill #1
  Filled 2022-01-18: qty 30, 30d supply, fill #2

## 2021-10-20 MED ORDER — COLCHICINE 0.6 MG PO TABS: ORAL_TABLET | ORAL | 11 refills | Status: AC

## 2021-10-21 ENCOUNTER — Telehealth: Payer: Self-pay | Admitting: *Deleted

## 2021-10-21 ENCOUNTER — Telehealth: Payer: Self-pay

## 2021-10-21 NOTE — Telephone Encounter (Signed)
-----   Message from Truman Hayward, MD sent at 10/20/2021  5:02 PM EST ----- Bruce Hunter creatinine is now up to 3. He needs to see PCP vs ER asap for workup and certainly stop bumex if he is taking, hydrate with increased fluid intake ----- Message ----- From: Interface, [facility] Lab Results In Sent: 10/20/2021   4:01 PM EST To: Truman Hayward, MD

## 2021-10-21 NOTE — Telephone Encounter (Signed)
Spoke with patient, relayed concerns for increasing creatinine. Advised patient Dr. Tommy Medal would like him to stop taking the Bumex. Advised he needs to be seen by his PCP, or go to the emergency department if they are unable to fit him in. Patient says he will call them Monday. RN emphasized importance of taking care of this now and recommended he call his PCP immediately or go to the hospital. Recommended increased fluid intake. Patient verbalized understanding and has no further questions.   Beryle Flock, RN

## 2021-10-21 NOTE — Telephone Encounter (Signed)
[facility] and faxed over the referral and the fax number is [phone].Lattie Haw

## 2021-10-21 NOTE — Telephone Encounter (Signed)
I left a  HIPPA compliant message for the patient to return office call.  Patient needs to follow up with PCP regarding elevated creatinine level of 3 and Dr. Tommy Medal has also advised to stop Bumex if patient still taking this medication.  Larae Grooms, MD (PCP) office contacted and left a message to contact our office call.   Routing message to PCP to also make aware. Staff will continue to try reach patient  Eugenia Mcalpine

## 2021-10-22 LAB — C. TRACHOMATIS/N. GONORRHOEAE RNA
C. trachomatis RNA, TMA: NOT DETECTED
N. gonorrhoeae RNA, TMA: NOT DETECTED

## 2021-10-22 LAB — CBC WITH DIFFERENTIAL/PLATELET
Absolute Monocytes: 1274 cells/uL — ABNORMAL HIGH (ref 200–950)
Basophils Absolute: 22 cells/uL (ref 0–200)
Basophils Relative: 0.3 %
Eosinophils Absolute: 238 cells/uL (ref 15–500)
Eosinophils Relative: 3.3 %
HCT: 37.9 % — ABNORMAL LOW (ref 38.5–50.0)
Hemoglobin: 12.5 g/dL — ABNORMAL LOW (ref 13.2–17.1)
Lymphs Abs: 1591 cells/uL (ref 850–3900)
MCH: 31 pg (ref 27.0–33.0)
MCHC: 33 g/dL (ref 32.0–36.0)
MCV: 94 fL (ref 80.0–100.0)
MPV: 11 fL (ref 7.5–12.5)
Monocytes Relative: 17.7 %
Neutro Abs: 4075 cells/uL (ref 1500–7800)
Neutrophils Relative %: 56.6 %
Platelets: 187 10*3/uL (ref 140–400)
RBC: 4.03 10*6/uL — ABNORMAL LOW (ref 4.20–5.80)
RDW: 14.5 % (ref 11.0–15.0)
Total Lymphocyte: 22.1 %
WBC: 7.2 10*3/uL (ref 3.8–10.8)

## 2021-10-22 LAB — RPR: RPR Ser Ql: NONREACTIVE

## 2021-10-22 LAB — COMPLETE METABOLIC PANEL WITH GFR
AG Ratio: 1.6 (calc) (ref 1.0–2.5)
ALT: 11 U/L (ref 9–46)
AST: 18 U/L (ref 10–40)
Albumin: 4.5 g/dL (ref 3.6–5.1)
Alkaline phosphatase (APISO): 77 U/L (ref 36–130)
BUN/Creatinine Ratio: 14 (calc) (ref 6–22)
BUN: 44 mg/dL — ABNORMAL HIGH (ref 7–25)
CO2: 25 mmol/L (ref 20–32)
Calcium: 9.2 mg/dL (ref 8.6–10.3)
Chloride: 104 mmol/L (ref 98–110)
Creat: 3.1 mg/dL — ABNORMAL HIGH (ref 0.60–1.29)
Globulin: 2.9 g/dL (calc) (ref 1.9–3.7)
Glucose, Bld: 128 mg/dL — ABNORMAL HIGH (ref 65–99)
Potassium: 4.2 mmol/L (ref 3.5–5.3)
Sodium: 136 mmol/L (ref 135–146)
Total Bilirubin: 0.3 mg/dL (ref 0.2–1.2)
Total Protein: 7.4 g/dL (ref 6.1–8.1)
eGFR: 24 mL/min/{1.73_m2} — ABNORMAL LOW (ref >=60)

## 2021-10-22 LAB — LIPID PANEL
Cholesterol: 140 mg/dL (ref <200)
HDL: 25 mg/dL — ABNORMAL LOW (ref >=40)
LDL Cholesterol (Calc): 83 mg/dL (calc)
Non-HDL Cholesterol (Calc): 115 mg/dL (calc) (ref <130)
Total CHOL/HDL Ratio: 5.6 (calc) — ABNORMAL HIGH (ref <5.0)
Triglycerides: 230 mg/dL — ABNORMAL HIGH (ref <150)

## 2021-10-22 LAB — HIV-1 RNA QUANT-NO REFLEX-BLD
HIV 1 RNA Quant: NOT DETECTED Copies/mL
HIV-1 RNA Quant, Log: NOT DETECTED Log cps/mL

## 2021-10-22 LAB — T-HELPER CELLS (CD4) COUNT (NOT AT ARMC)
Absolute CD4: 487 cells/uL — ABNORMAL LOW (ref 490–1740)
CD4 T Helper %: 28 % — ABNORMAL LOW (ref 30–61)
Total lymphocyte count: 1722 cells/uL (ref 850–3900)

## 2021-10-27 ENCOUNTER — Other Ambulatory Visit (HOSPITAL_COMMUNITY): Payer: Self-pay

## 2021-10-28 ENCOUNTER — Other Ambulatory Visit: Payer: Self-pay

## 2021-10-28 ENCOUNTER — Ambulatory Visit (HOSPITAL_COMMUNITY): Payer: Medicaid Other | Attending: Cardiology

## 2021-10-28 ENCOUNTER — Other Ambulatory Visit (HOSPITAL_COMMUNITY): Payer: Self-pay

## 2021-10-28 DIAGNOSIS — I429 Cardiomyopathy, unspecified: Secondary | ICD-10-CM

## 2021-10-28 DIAGNOSIS — I428 Other cardiomyopathies: Secondary | ICD-10-CM | POA: Diagnosis not present

## 2021-10-28 LAB — ECHOCARDIOGRAM LIMITED
Area-P 1/2: 5.02 cm2
S' Lateral: 3.8 cm

## 2021-11-01 ENCOUNTER — Encounter
Payer: Medicaid Other | Attending: Physical Medicine and Rehabilitation | Admitting: Physical Medicine and Rehabilitation

## 2021-11-01 ENCOUNTER — Other Ambulatory Visit: Payer: Self-pay

## 2021-11-01 ENCOUNTER — Encounter: Payer: Self-pay | Admitting: Physical Medicine and Rehabilitation

## 2021-11-01 VITALS — BP 126/78 | HR 79 | Temp 99.5°F | Ht 70.0 in | Wt 269.0 lb

## 2021-11-01 DIAGNOSIS — M109 Gout, unspecified: Secondary | ICD-10-CM | POA: Insufficient documentation

## 2021-11-01 DIAGNOSIS — G6289 Other specified polyneuropathies: Secondary | ICD-10-CM | POA: Insufficient documentation

## 2021-11-01 MED ORDER — GABAPENTIN 600 MG PO TABS: 600.0000 mg | ORAL_TABLET | Freq: Three times a day (TID) | ORAL | 3 refills | Status: AC

## 2021-11-01 NOTE — Progress Notes (Signed)
Subjective:    Patient ID: Bruce Hunter, male    DOB: Jan 09, 1976, 46 y.o.   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  1) Debility -reports feeling tired and having little energy- this depends on whether he has company of not -denies falls, he has been using the wheelchair.  -has started driving.   2) Overeating -reports that he eats all the time.  3) Peripheral neuropathy -continues to have pain in bilateral lower extremities.  -he is not sure how much Qutenza helped last visit but is ready to try again -He had no negative side effects from [place] last visit.  -no longer using morphine -current pain in feet is 10/10  4) ADHD -sometimes has trouble concentrating  5) Gout -was told by his doctor recently that he may have gout in his feet- he was started on colchicine. He has not taken allopurinol  Review of Systems  Constitutional: Negative.   HENT: Negative.    Eyes: Negative.   Respiratory: Negative.    Cardiovascular:  Positive for leg swelling.  Gastrointestinal: Negative.   Endocrine: Negative.   Genitourinary: Negative.   Musculoskeletal: Negative.   Skin: Negative.   Allergic/Immunologic: Negative.   Neurological:  Positive for weakness.  Hematological: Negative.   Psychiatric/Behavioral: Negative.        Objective:   Physical Exam Gen: no distress, normal appearing, BMI 38.60, weight 269 lbs, BP 126/78 HEENT: oral mucosa pink and moist, NCAT Cardio: Reg rate Chest: normal effort, normal rate of breathing Abd: soft, abdominal obesity Ext: bilateral lower extremity swelling Psych: pleasant, normal affect, cheerful and jovial. Denies depression Skin: imprints from socks in both legs. Swelling has decreased. No open lesions Neuro: Alert and oriented x3. Hyperalgesia to both feet with gentle palpation Musculoskeletal: wheelchair bound    Assessment & Plan:  Bruce Hunter is a 46 year old man who presents for hospital follow-up for ICU myopathy.   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  -Discussed benefits of exercise in reducing pain. -Discussed following foods that may reduce pain: 1) Ginger (especially studied for arthritis)- reduce leukotriene production to decrease inflammation 2) Blueberries- high in phytonutrients that decrease inflammation 3) Salmon- marine omega-3s reduce joint swelling and pain 4) Pumpkin seeds- reduce inflammation 5) dark chocolate- reduces inflammation 6) turmeric- reduces inflammation 7) tart cherries - reduce pain and stiffness 8) extra virgin olive oil - its compound olecanthal helps to block prostaglandins  9) chili peppers- can be eaten or applied topically via capsaicin 10) mint- helpful for headache, muscle aches, joint pain, and itching 11) garlic- reduces inflammation  Link to further information on diet for chronic pain: http://www.randall.com/  -Discussed Qutenza as an option for neuropathic pain control. Discussed that this is a capsaicin patch, stronger than capsaicin cream. Discussed that it is currently approved for diabetic peripheral neuropathy and post-herpetic neuralgia, but that it has also shown benefit in treating other forms of neuropathy. Provided patient with link to site to learn more about the patch: CinemaBonus.fr. Discussed that the patch would be placed in office and benefits usually last 3 months. Discussed that unintended exposure to capsaicin can cause severe irritation of eyes, mucous membranes, respiratory tract, and skin, but that Qutenza is a local treatment  and does not have the systemic side effects of other nerve  medications. Discussed that there may be pain, itching, erythema, and decreased sensory function associated with the application of Qutenza. Side effects usually subside within 1 week. A cold pack of analgesic medications can help with these side effects. Blood pressure can also be increased due to pain associated with administration of the patch.   4 patches of Qutenza was applied to the area of pain. Ice packs were applied during the procedure to ensure patient comfort. Blood pressure was monitored every 15 minutes. The patient tolerated the procedure well. Post-procedure instructions were given and follow-up has been scheduled.    -Provided with a Brochure for [employer] Fe[employer] Contest  4) Bilateral lower extremity swelling -continue lasix M/W/F 40mg  -elevate legs as much as possible -continue to follow with nephrology  5) GERD -prescribed pepcid 40mg  daily PRN  6) Gout: -recommended discussing allopurinol with his nephrologist to help prevent gout attacks

## 2021-11-12 ENCOUNTER — Other Ambulatory Visit (HOSPITAL_COMMUNITY): Payer: Self-pay

## 2021-11-18 ENCOUNTER — Other Ambulatory Visit (HOSPITAL_COMMUNITY): Payer: Self-pay

## 2021-11-23 ENCOUNTER — Other Ambulatory Visit (HOSPITAL_COMMUNITY): Payer: Self-pay

## 2021-11-23 ENCOUNTER — Other Ambulatory Visit: Payer: Self-pay | Admitting: Infectious Disease

## 2021-11-23 NOTE — Progress Notes (Signed)
? ?Cardiology Office Note   ? ?Date:  11/30/2021  ? ?ID:  Bruce Hunter, DOB 03/19/1976, MRN [id number] ? ? ?PCP:  Elwyn Reach, MD ?  ?[place]  ?Cardiologist:  Larae Grooms, MD   ?Advanced Practice Provider:  No care team member to display ?Electrophysiologist:  None  ? ?[id number]}  ? ?Chief Complaint  ?Patient presents with  ? Follow-up  ? ? ?History of Present Illness:  ?Bruce Hunter is a 46 y.o. male with diagnosed HIV and AIDS and a prolonged hospitalization from the end of July to the end of August with respiratory failure that required mechanical intubation and tracheostomy.  He developed septic physiology with shock and acute renal failure.  He then developed significant fluid overload requiring IV diuresis.  2D echo 06/2021 LVEF 40 to 45% global hypokinesis mild LVH grade 2 DD. ? ?Patient was referred to Dr. Irish Lack 09/09/2021 who felt his LV dysfunction will be transient from his severe systemic illness he was going to check with the patient's pharmacy to see what heart failure medications he could tolerate in the setting of his current HIV medicines.  He was continued on beta-blocker and set up for a limited echo done 10/28/21 improved LVEF 50-55%.  ? ?Patient comes in for f/u. Reviewed echo with him. No edema or shortness of breath. Walks at [facility] to 7/10 mile. Overall doing well. Having trouble with gout. ? ? ? ?Past Medical History:  ?Diagnosis Date  ? Anxiety 05/07/2021  ? Asthma   ? Diabetes mellitus without complication ([place])   ? Gout   ? Hyperlipidemia   ? Hypertension   ? Hypertensive urgency 06/24/2021  ? Obesity   ? Obesity 06/24/2021  ? Pulmonary edema 06/24/2021  ? ? ?Past Surgical History:  ?Procedure Laterality Date  ? BIOPSY  02/15/2021  ? Procedure: BIOPSY;  Surgeon: Ronnette Juniper, MD;  Location: [facility];  Service: Gastroenterology;;  ? COLONOSCOPY WITH PROPOFOL N/A 02/15/2021  ? Procedure: COLONOSCOPY WITH PROPOFOL;  Surgeon: Ronnette Juniper, MD;   Location: [facility];  Service: Gastroenterology;  Laterality: N/A;  ? ESOPHAGOGASTRODUODENOSCOPY (EGD) WITH PROPOFOL N/A 02/15/2021  ? Procedure: ESOPHAGOGASTRODUODENOSCOPY (EGD) WITH PROPOFOL;  Surgeon: Ronnette Juniper, MD;  Location: [facility];  Service: Gastroenterology;  Laterality: N/A;  ? GANGLION CYST EXCISION Right 02/26/2020  ? Procedure: EXCISION TOPHUS RIGHT FOOT;  Surgeon: Evelina Bucy, DPM;  Location[name];  Service: Podiatry;  Laterality: Right;  ? NO PAST SURGERIES    ? ? ?Current Medications: ?Current Meds  ?Medication Sig  ? acetaminophen (TYLENOL) 325 MG tablet Take 1 tablet (325 mg total) by mouth every 4 (four) hours as needed for mild pain.  ? aspirin EC 81 MG EC tablet Take 1 tablet (81 mg total) by mouth daily. Swallow whole.  ? atorvastatin (LIPITOR) 10 MG tablet Take 1 tablet (10 mg total) by mouth daily.  ? clonazePAM (KLONOPIN) 0.5 MG tablet Take 1 tablet (0.5 mg total) by mouth 3 (three) times daily.  ? colchicine 0.6 MG tablet For an acute gout attack take two tablets and then another one tablet an hour later then resume one tablet daily for prevention  ? cyanocobalamin 1000 MCG tablet Take 1 tablet (1,000 mcg total) by mouth daily.  ? dolutegravir-lamiVUDine (DOVATO) 50-300 MG tablet Take 1 tablet by mouth daily.  ? doxycycline (VIBRAMYCIN) 100 MG capsule Take 100 mg by mouth 2 (two) times daily.  ? FARXIGA 10 MG TABS tablet Take 10  mg by mouth daily.  ? fluticasone furoate-vilanterol (BREO ELLIPTA) 100-25 MCG/ACT AEPB Inhale into the lungs.  ? gabapentin (NEURONTIN) 600 MG tablet Take 1 tablet (600 mg total) by mouth 3 (three) times daily.  ? insulin glargine (LANTUS SOLOSTAR) 100 UNIT/ML Solostar Pen Inject 5 Units into the skin daily.  ? Insulin Pen Needle 32G X 4 MM MISC use as directed  ? magnesium oxide (MAG-OX) 400 MG tablet Take 1 tablet (400 mg total) by mouth 2 (two) times daily.  ? methocarbamol (ROBAXIN) 500 MG tablet Take 1 tablet (500 mg total) by  mouth every 6 (six) hours as needed for muscle spasms.  ? omeprazole (PRILOSEC) 20 MG capsule Take 20 mg by mouth daily.  ? Oxycodone HCl 10 MG TABS Take 1 tablet (10 mg total) by mouth every 4 (four) hours as needed. MAX 5 TABS PER DAY AS NEEDED FOR SEVERE PAIN  ? SYMBICORT 160-4.5 MCG/ACT inhaler Inhale into the lungs.  ? tamsulosin (FLOMAX) 0.4 MG CAPS capsule Take 1 capsule (0.4 mg total) by mouth daily after supper.  ? traZODone (DESYREL) 50 MG tablet Take 50 mg by mouth at bedtime.  ?  ? ?Allergies:   Lactose intolerance (gi)  ? ?Social History  ? ?Socioeconomic History  ? Marital status: Single  ?  Spouse name: Not on file  ? Number of children: Not on file  ? Years of education: Not on file  ? Highest education level: Not on file  ?Occupational History  ? Not on file  ?Tobacco Use  ? Smoking status: Every Day  ?  Packs/day: 1.00  ?  Types: Cigarettes  ? Smokeless tobacco: Never  ?Vaping Use  ? Vaping Use: Some days  ?Substance and Sexual Activity  ? Alcohol use: Yes  ? Drug use: Yes  ?  Frequency: 1.0 times per week  ?  Types: Marijuana  ?  Comment: marijuana at midnight last night; daily  ? Sexual activity: Yes  ?  Comment: accepted condoms  ?Other Topics Concern  ? Not on file  ?Social History Narrative  ? Not on file  ? ?Social Determinants of Health  ? ?Financial Resource Strain: Not on file  ?Food Insecurity: Not on file  ?Transportation Needs: Not on file  ?Physical Activity: Not on file  ?Stress: Not on file  ?Social Connections: Not on file  ?  ? ?Family History:  The patient's  family history includes Asthma in his mother; Cancer in his maternal grandmother and mother; Diabetes in his brother, maternal grandmother, mother, and another family member; Obesity in his brother, mother, and sister.  ? ?ROS:   ?Please see the history of present illness.    ?ROS All other systems reviewed and are negative. ? ? ?PHYSICAL EXAM:   ?VS:  BP 132/80   Pulse 79   Ht 5\' 10"  (1.778 m)   Wt 279 lb (126.6 kg)    SpO2 96%   BMI 40.03 kg/m?   ?Physical Exam  ?GEN: Obese, in no acute distress  ?Neck: no JVD, carotid bruits, or masses ?Cardiac:RRR; no murmurs, rubs, or gallops  ?Respiratory:  clear to auscultation bilaterally, normal work of breathing ?GI: soft, nontender, nondistended, + BS ?Ext: without cyanosis, clubbing, or edema, Good distal pulses bilaterally ?Neuro:  Alert and Oriented x 3 ?Psych: normal ?Wt Readings from Last 3 Encounters:  ?11/30/21 279 lb (126.6 kg)  ?11/01/21 269 lb (122 kg)  ?10/20/21 277 lb (125.6 kg)  ?  ? ? ?Studies/Labs Reviewed:  ? ?  EKG:  EKG is not ordered today.    ? ?Recent Labs: ?02/12/2021: TSH 0.430 ?05/03/2021: Magnesium 1.5 ?06/29/2021: B Natriuretic Peptide 151.4 ?07/22/2021: Pro B Natriuretic peptide (BNP) 735 ?10/20/2021: ALT 11; BUN 44; Creat 3.10; Hemoglobin 12.5; Platelets 187; Potassium 4.2; Sodium 136  ? ?Lipid Panel ?   ?Component Value Date/Time  ? CHOL 140 10/20/2021 1022  ? TRIG 230 (H) 10/20/2021 1022  ? HDL 25 (L) 10/20/2021 1022  ? CHOLHDL 5.6 (H) 10/20/2021 1022  ? VLDL 23 07/01/2021 0236  ? [place] 83 10/20/2021 1022  ? ? ?Additional studies/ records that were reviewed today include:  ?Echo 10/28/21 ?IMPRESSIONS ?Left ventricular ejection fraction, by estimation, is 50 to 55%. The left ventricle has low ?normal function. The left ventricle has no regional wall motion abnormalities. There is ?mild concentric left ventricular hypertrophy. Left ventricular diastolic parameters are ?consistent with Grade I diastolic dysfunction (impaired relaxation). ?1. ?Right ventricular systolic function is normal. The right ventricular size is normal. ?Tricuspid regurgitation signal is inadequate for assessing PA pressure. ?2. ?The mitral valve is normal in structure. No evidence of mitral valve regurgitation. No ?evidence of mitral stenosis. ?3. ?The aortic valve is normal in structure. Aortic valve regurgitation is not visualized. No ?aortic stenosis is present. ?4. ?The inferior vena  cava is normal in size with greater than 50% respiratory variability, ?suggesting right atrial pressure of 3 mmHg. ?5. ?Comparison(s): A prior study was performed on 07/01/2021. The LVEF has improve; was 40-45% now

## 2021-11-23 NOTE — Telephone Encounter (Signed)
Please advise on rx. D/c on 2/27 ?

## 2021-11-24 ENCOUNTER — Other Ambulatory Visit (HOSPITAL_COMMUNITY): Payer: Self-pay

## 2021-11-25 ENCOUNTER — Other Ambulatory Visit (HOSPITAL_COMMUNITY): Payer: Self-pay

## 2021-11-25 ENCOUNTER — Telehealth: Payer: Self-pay

## 2021-11-25 NOTE — Telephone Encounter (Signed)
Patient called requesting refill of Bactrim.Advised him per Dr. Tommy Medal he does not need Bactrim anymore as his CD4 count is well above 200. Patient verbalized understanding and has no further questions.  ? ?Beryle Flock, RN ? ?

## 2021-11-30 ENCOUNTER — Ambulatory Visit (INDEPENDENT_AMBULATORY_CARE_PROVIDER_SITE_OTHER): Payer: Medicaid Other | Admitting: Physician Assistant

## 2021-11-30 ENCOUNTER — Other Ambulatory Visit: Payer: Self-pay

## 2021-11-30 VITALS — BP 132/80 | HR 79 | Ht 70.0 in | Wt 279.0 lb

## 2021-11-30 DIAGNOSIS — I428 Other cardiomyopathies: Secondary | ICD-10-CM

## 2021-11-30 DIAGNOSIS — B20 Human immunodeficiency virus [HIV] disease: Secondary | ICD-10-CM | POA: Diagnosis not present

## 2021-11-30 DIAGNOSIS — I5042 Chronic combined systolic (congestive) and diastolic (congestive) heart failure: Secondary | ICD-10-CM | POA: Diagnosis not present

## 2021-11-30 NOTE — Patient Instructions (Signed)
Medication Instructions:  ?Your physician recommends that you continue on your current medications as directed. Please refer to the Current Medication list given to you today. ? ?*If you need a refill on your cardiac medications before your next appointment, please call your pharmacy* ? ? ?Lab Work: ?None ?If you have labs (blood work) drawn today and your tests are completely normal, you will receive your results only by: ?MyChart Message (if you have MyChart) OR ?A paper copy in the mail ?If you have any lab test that is abnormal or we need to change your treatment, we will call you to review the results. ? ?Follow-Up: ?At [facility], you and your health needs are our priority.  As part of our continuing mission to provide you with exceptional heart care, we have created designated Provider Care Teams.  These Care Teams include your primary Cardiologist (physician) and Advanced Practice Providers (APPs -  Physician Assistants and Nurse Practitioners) who all work together to provide you with the care you need, when you need it. ? ? ?Your next appointment:   ?6 month(s) ? ?The format for your next appointment:   ?In Person ? ?Provider:   ?Larae Grooms, MD    ?

## 2021-12-07 ENCOUNTER — Other Ambulatory Visit (HOSPITAL_COMMUNITY): Payer: Self-pay

## 2021-12-07 ENCOUNTER — Other Ambulatory Visit: Payer: Self-pay | Admitting: Physical Medicine and Rehabilitation

## 2021-12-08 ENCOUNTER — Other Ambulatory Visit (HOSPITAL_COMMUNITY): Payer: Self-pay

## 2021-12-21 ENCOUNTER — Other Ambulatory Visit (HOSPITAL_COMMUNITY): Payer: Self-pay

## 2021-12-27 ENCOUNTER — Ambulatory Visit: Payer: Medicaid Other | Admitting: Podiatry

## 2022-01-03 ENCOUNTER — Ambulatory Visit (INDEPENDENT_AMBULATORY_CARE_PROVIDER_SITE_OTHER): Payer: Medicaid Other | Admitting: Podiatry

## 2022-01-03 ENCOUNTER — Encounter: Payer: Self-pay | Admitting: Podiatry

## 2022-01-03 DIAGNOSIS — M109 Gout, unspecified: Secondary | ICD-10-CM | POA: Diagnosis not present

## 2022-01-03 DIAGNOSIS — M19079 Primary osteoarthritis, unspecified ankle and foot: Secondary | ICD-10-CM | POA: Diagnosis not present

## 2022-01-03 DIAGNOSIS — M1A9XX1 Chronic gout, unspecified, with tophus (tophi): Secondary | ICD-10-CM

## 2022-01-03 DIAGNOSIS — B351 Tinea unguium: Secondary | ICD-10-CM

## 2022-01-03 DIAGNOSIS — M79675 Pain in left toe(s): Secondary | ICD-10-CM

## 2022-01-03 DIAGNOSIS — M79674 Pain in right toe(s): Secondary | ICD-10-CM | POA: Diagnosis not present

## 2022-01-04 ENCOUNTER — Ambulatory Visit (INDEPENDENT_AMBULATORY_CARE_PROVIDER_SITE_OTHER): Payer: Medicaid Other | Admitting: Podiatry

## 2022-01-04 DIAGNOSIS — M1A3791 Chronic gout due to renal impairment, unspecified ankle and foot, with tophus (tophi): Secondary | ICD-10-CM

## 2022-01-04 DIAGNOSIS — L0889 Other specified local infections of the skin and subcutaneous tissue: Secondary | ICD-10-CM | POA: Diagnosis not present

## 2022-01-04 MED ORDER — METHYLPREDNISOLONE 4 MG PO TBPK: ORAL_TABLET | ORAL | 0 refills | Status: DC

## 2022-01-04 NOTE — Patient Instructions (Signed)
Call to schedule your rheumatology consultation: ? ?[address], [place], [place] [address] ? ?Phone: [phone] ?

## 2022-01-09 NOTE — Progress Notes (Signed)
?  Subjective:  ?Patient ID: Bruce Hunter, male    DOB: 02/09/1976,  MRN: [id number] ? ?Chief Complaint  ?Patient presents with  ? Gout  ?   FOOT AND ANKLE PAIN  ? ? ?46 y.o. male presents with the above complaint. History confirmed with patient.  He presents today for reevaluation of gout and bilateral foot pain, his uric acid on 10/19/2021 was 10.4.  He takes colchicine daily.  Both feet continue to be very swollen the right is worse than the left and cause quite a bit of pain for him. ? ?Objective:  ?Physical Exam: ?warm, good capillary refill, no trophic changes or ulcerative lesions, normal DP and PT pulses, normal sensory exam, and significant edema, palpable gouty tophi over the dorsal midfoot bilateral right is worse than left very painful to palpation and is erythematous, no fluctuance to suggest abscess no cellulitis ? ?Assessment:  ? ?1. 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 ? ? ?Plan:  ?Patient was evaluated and treated and all questions answered. ? ?Discussed etiology and treatment options of chronic and acute gout currently he is in an acute gout flare.  I discussed with him he has severe gout and he needs specialized consultation with rheumatology.  I sent a referral for this for him.  Advised we could add a methylprednisolone taper he has to watch his blood sugar very closely while he is on this and we discussed this complication risk.  Injections have been helpful previously and bilateral injections over the dorsal midfoot were performed today with 10 mg of Kenalog and 1 cc of lidocaine.  He tolerated the procedure well. ? ?Return if symptoms worsen or fail to improve.  ? ?

## 2022-01-10 NOTE — Progress Notes (Signed)
?  Subjective:  ?Patient ID: Bruce Hunter, male    DOB: 1976/08/14,  MRN: [id number] ? ?[name] presents to clinic today for painful elongated mycotic toenails 1-5 bilaterally which are tender when wearing enclosed shoe gear. Pain is relieved with periodic professional debridement. ? ?Patient has h/o chronic gout with painful ankles b/l. He has not seen Rheumatology for his condition. ? ?PCP is Elwyn Reach, MD , and last visit was unknown by patient. ? ?Allergies  ?Allergen Reactions  ? Lactose Intolerance (Gi) Nausea And Vomiting  ? ? ?Review of Systems: Negative except as noted in the HPI. ? ?Objective:  ?Constitutional Patient is a pleasant 46 y.o. African American male in NAD. AAO x 3.  ?Vascular Capillary refill time to digits immediate b/l. Palpable pedal pulses b/l LE. Pedal hair sparse. Lower extremity skin temperature gradient within normal limits. No cyanosis or clubbing noted.  ?Neurologic Normal speech. Protective sensation diminished with 10g monofilament b/l.  ?Dermatologic Pedal skin with normal turgor, texture and tone bilaterally. No open wounds bilaterally. No interdigital macerations bilaterally. Toenails 1-5 b/l elongated, discolored, dystrophic, thickened, crumbly with subungual debris and tenderness to dorsal palpation.  ?Orthopedic: Normal muscle strength 5/5 to all lower extremity muscle groups bilaterally. Pain b/l ankle joints with passive/active ROM; no effusions noted. Patient ambulates independent of any assistive aids.  ? ? ?  Latest Ref Rng & Units 07/22/2021  ? 12:16 PM 06/30/2021  ?  4:50 AM 03/06/2021  ? 10:31 AM 02/12/2021  ?  3:22 AM  ?Hemoglobin A1C  ?Hemoglobin-A1c <5.7 % of total Hgb 7.4   6.6   5.4   5.9    ? ?Lab Results  ?Component Value Date  ? LABURIC 10.4 (H) 10/19/2021  ?  ?Assessment/Plan: ?1. Pain due to onychomycosis of toenails of both feet   ?2. Gouty tophi   ?3. Gout of right foot, unspecified cause, unspecified chronicity   ?4. Arthritis of midfoot   ?   ?-Patient was evaluated and treated. All patient's and/or POA's questions/concerns answered on today's visit. ?-Patient to continue soft, supportive shoe gear daily. ?-Toenails 1-5 b/l were debrided in length and girth with sterile nail nippers and dremel without iatrogenic bleeding.  ?-Rheumatology Consult for chronic gout. ?-Patient referred to Dr. Sherryle Lis for evaluation of bilateral ankle pain in presence of chronic gout. ?-Patient/POA to call should there be question/concern in the interim.  ? ?Return in about 3 months (around 04/05/2022). ? ?Marzetta Board, DPM  ?

## 2022-01-13 ENCOUNTER — Encounter: Payer: Self-pay | Admitting: Physical Medicine and Rehabilitation

## 2022-01-13 ENCOUNTER — Encounter
Payer: Medicaid Other | Attending: Physical Medicine and Rehabilitation | Admitting: Physical Medicine and Rehabilitation

## 2022-01-13 VITALS — BP 133/77 | HR 75 | Ht 70.0 in | Wt 278.2 lb

## 2022-01-13 DIAGNOSIS — G6289 Other specified polyneuropathies: Secondary | ICD-10-CM | POA: Diagnosis present

## 2022-01-13 DIAGNOSIS — R609 Edema, unspecified: Secondary | ICD-10-CM | POA: Diagnosis present

## 2022-01-13 DIAGNOSIS — F419 Anxiety disorder, unspecified: Secondary | ICD-10-CM | POA: Insufficient documentation

## 2022-01-13 DIAGNOSIS — K219 Gastro-esophageal reflux disease without esophagitis: Secondary | ICD-10-CM | POA: Diagnosis present

## 2022-01-13 NOTE — Progress Notes (Signed)
? ?Subjective:  ? ? Patient ID: Bruce KEEVEN, male    DOB: 1976-08-31, 46 y.o.   MRN: [id number] ? 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 ? ?1) Debility ?-reports feeling tired and having little energy- this depends on whether he has company of not ?-denies falls, he has been using the wheelchair.  ?-has started driving.  ? ?2) Overeating ?-reports that he eats all the time. ? ?3) Peripheral neuropathy ?-continues to have pain in bilateral lower extremities.  ?-he is not sure how much Qutenza helped last visit but is ready to try again ?-He had burning over this surgical scar with his last Qutenza treatment which lasted for 1 day.  ?-no longer using morphine ?-current pain in feet is 10/10 ?-feels like there is rocks on the bottom of his feet ?-his mobility is much improved since he began Qutenza treatments ?-the oxycodone helps sometimes and sometimes it doesn't ? ? ?4) ADHD ?-sometimes has trouble concentrating ? ?5) Gout ?-was told by his doctor recently that he may have gout in his feet- he was started on colchicine. He has not taken allopurinol ? ?6) Lower extremity edema has resolved ? ?Review of Systems  ?Constitutional: Negative.   ?HENT: Negative.    ?Eyes: Negative.   ?Respiratory: Negative.    ?Cardiovascular:  Positive for leg swelling.  ?Gastrointestinal: Negative.   ?Endocrine: Negative.   ?Genitourinary: Negative.   ?Musculoskeletal: Negative.   ?Skin: Negative.   ?Allergic/Immunologic: Negative.   ?Neurological:  Positive for weakness.  ?Hematological: Negative.   ?Psychiatric/Behavioral: Negative.    ? ?   ?Objective:  ? Physical Exam ?Gen: no distress, normal appearing, BMI 38.60, weight 269 lbs, BP 126/78 ?HEENT: oral mucosa pink and moist, NCAT ?Cardio: Reg rate ?Chest: normal effort, normal rate of breathing ?Abd: soft, abdominal obesity ?Ext: bilateral lower extremity swelling ?Psych: pleasant, normal affect, cheerful and  jovial. Denies depression ?Skin: imprints from socks in both legs. Swelling has decreased. No open lesions. Postsurgical scarring on dorsum of right foot.  ?Neuro: Alert and oriented x3. Hyperalgesia to both feet with gentle palpation ?Musculoskeletal: ambulating without assistive device.  ?   ?Assessment & Plan:  ?Bruce Hunter is a 46 year old man who presents for hospital follow-up for ICU myopathy.  ? 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  ? ?2) Anxiety: ?-continue Klonopin 0.5mg  TID PRN ? ?3) Bilateral lower extremity peripheral neuropathy ?-Discussed current symptoms of pain and history of pain.  ?-Discussed benefits of exercise in reducing pain. ?-Discussed following foods that may reduce pain: ?1) Ginger (especially studied for arthritis)- reduce leukotriene production to decrease inflammation ?2) Blueberries- high in phytonutrients that decrease inflammation ?3) Salmon- marine omega-3s reduce joint swelling and pain ?4) Pumpkin seeds- reduce inflammation ?5) dark chocolate- reduces inflammation ?6) turmeric- reduces inflammation ?7) tart cherries - reduce pain and stiffness ?8) extra virgin olive oil - its compound olecanthal helps to block prostaglandins  ?9) chili peppers- can be eaten or applied topically via capsaicin ?10) mint- helpful for headache, muscle aches, joint pain, and itching ?11) garlic- reduces inflammation ? ?Link to further information on diet for chronic pain: http://www.randall.com/  ? ?-Discussed Qutenza as an option for neuropathic pain control. Discussed that this is a capsaicin patch, stronger than capsaicin cream. Discussed that it is currently approved for diabetic peripheral neuropathy and post-herpetic neuralgia, but that it has also shown benefit in  treating other forms of neuropathy. Provided patient with link to site to learn more about the  patch: CinemaBonus.fr. Discussed that the patch would be placed in office and benefits usually last 3 months. Discussed that unintended exposure to capsaicin can cause severe irritation of eyes, mucous membranes, respiratory tract, and skin, but that Qutenza is a local treatment and does not have the systemic side effects of other nerve medications. Discussed that there may be pain, itching, erythema, and decreased sensory function associated with the application of Qutenza. Side effects usually subside within 1 week. A cold pack of analgesic medications can help with these side effects. Blood pressure can also be increased due to pain associated with administration of the patch.  ? ?4 patches of Qutenza was applied to the area of pain. Ice packs were applied during the procedure to ensure patient comfort. Blood pressure was monitored every 15 minutes. The patient tolerated the procedure well. Post-procedure instructions were given and follow-up has been scheduled.   ? ?-Provided with a Brochure for [employer] Your Feet Contest ? ?4) Bilateral lower extremity swelling, resolved ?-continue lasix M/W/F 40mg  ?-elevate legs as much as possible ?-continue to follow with nephrology ? ?5) GERD ?-continue pepcid 40mg  daily PRN ? ?6) Gout: ?-recommended discussing allopurinol with his nephrologist to help prevent gout attacks ? ?

## 2022-01-16 ENCOUNTER — Other Ambulatory Visit: Payer: Self-pay | Admitting: Physical Medicine and Rehabilitation

## 2022-01-17 ENCOUNTER — Other Ambulatory Visit (HOSPITAL_COMMUNITY): Payer: Self-pay

## 2022-01-18 ENCOUNTER — Other Ambulatory Visit (HOSPITAL_COMMUNITY): Payer: Self-pay

## 2022-01-19 ENCOUNTER — Other Ambulatory Visit: Payer: Self-pay

## 2022-01-19 ENCOUNTER — Ambulatory Visit (INDEPENDENT_AMBULATORY_CARE_PROVIDER_SITE_OTHER): Payer: Medicaid Other | Admitting: Infectious Disease

## 2022-01-19 ENCOUNTER — Encounter: Payer: Self-pay | Admitting: Infectious Disease

## 2022-01-19 ENCOUNTER — Other Ambulatory Visit (HOSPITAL_COMMUNITY): Payer: Self-pay

## 2022-01-19 VITALS — BP 139/80 | HR 99 | Temp 98.9°F | Wt 277.0 lb

## 2022-01-19 DIAGNOSIS — E1165 Type 2 diabetes mellitus with hyperglycemia: Secondary | ICD-10-CM

## 2022-01-19 DIAGNOSIS — N183 Chronic kidney disease, stage 3 unspecified: Secondary | ICD-10-CM

## 2022-01-19 DIAGNOSIS — M7989 Other specified soft tissue disorders: Secondary | ICD-10-CM | POA: Diagnosis not present

## 2022-01-19 DIAGNOSIS — I129 Hypertensive chronic kidney disease with stage 1 through stage 4 chronic kidney disease, or unspecified chronic kidney disease: Secondary | ICD-10-CM

## 2022-01-19 DIAGNOSIS — Z794 Long term (current) use of insulin: Secondary | ICD-10-CM

## 2022-01-19 DIAGNOSIS — Z23 Encounter for immunization: Secondary | ICD-10-CM | POA: Diagnosis not present

## 2022-01-19 DIAGNOSIS — F17210 Nicotine dependence, cigarettes, uncomplicated: Secondary | ICD-10-CM

## 2022-01-19 DIAGNOSIS — R600 Localized edema: Secondary | ICD-10-CM | POA: Insufficient documentation

## 2022-01-19 DIAGNOSIS — E1122 Type 2 diabetes mellitus with diabetic chronic kidney disease: Secondary | ICD-10-CM

## 2022-01-19 DIAGNOSIS — I10 Essential (primary) hypertension: Secondary | ICD-10-CM

## 2022-01-19 DIAGNOSIS — B20 Human immunodeficiency virus [HIV] disease: Secondary | ICD-10-CM

## 2022-01-19 HISTORY — DX: Localized edema: R60.0

## 2022-01-19 MED ORDER — DOVATO 50-300 MG PO TABS
1.0000 | ORAL_TABLET | Freq: Every day | ORAL | 11 refills | Status: AC
  Filled 2022-01-19: qty 30, 30d supply, fill #0
  Filled 2022-02-15: qty 30, 30d supply, fill #1
  Filled 2022-03-15: qty 30, 30d supply, fill #2

## 2022-01-19 NOTE — Progress Notes (Signed)
? ?  Covid-19 Vaccination Clinic ? ?Name:  Bruce Hunter    ?MRN: [id number] ?DOB: 01/29/1976 ? ?01/19/2022 ? ?[name] was observed post Covid-19 immunization for 15 minutes without incident. He was provided with Vaccine Information Sheet and instruction to access the V-Safe system.  ? ?[name] was instructed to call 911 with any severe reactions post vaccine: ?Difficulty breathing  ?Swelling of face and throat  ?A fast heartbeat  ?A bad rash all over body  ?Dizziness and weakness  ? ?  ?Varney Daily, PharmD ?PGY1 Pharmacy Resident ? ?Please check AMION for all [facility] pharmacy phone numbers ?After 10:00 PM call main pharmacy [phone] ? ? ?

## 2022-01-19 NOTE — Progress Notes (Signed)
? ?Subjective:  ?Chief complaint: bilateral foot pain ? ? Patient ID: MAVERIK FOOT, male    DOB: 02-11-1976, 46 y.o.   MRN: [id number] ? ?HPI ? ?Jveon is a 46 year old black man who was recently diagnosed with HIV and AIDS.  He recently had a protracted  hospital stay from March 30, 2021 till the very end of August 2022. ? ?Admitted with respiratory failure that required mechanical intubation and ventilation and ultimately underwent tracheostomy. ? ?He completed a treatment course for PCP pneumonia though PCP was never proven he did have evidence of deteriorating few days during his stay at [facility] when he was off anti-PCP medications. ?  ?Developed septic physiology with shock and acute renal failure requiring CVVHD briefly. ?  ?Fortunately was able to come off the ventilator and his renal function recovered completely.  His hospital course was further complicated by diffuse gout that required a corticosteroid taper.  He also was diagnosed with diabetes mellitus while in the hospital and now is on insulin. ? ?He ultimately is discharged in the inpatient unit to inpatient rehab where he was further taken care of. ?  ?My partners Dr. Gale Journey and Baxter Flattery saw him again when he had recurrent fevers.  F AFB cultures were sent as well as testing for gonorrhea chlamydia CT chest abdomen pelvis.   ?  ?He was started back on corticosteroids with resolution of his fevers.  He now finish steroids for gout and is having intermittent fevers again at times. ? ?I suspect the fevers were  due to IRIS. ? ?Brennin has continued to improve in terms of his heart failure has been managed by his diabetes mellitus and he asked me actually for me to check an A1c.  He remains on colchicine for his gout. ? ?He is asking if I would provide  him with a marijuana card ? ?Past Medical History:  ?Diagnosis Date  ? Anxiety 05/07/2021  ? Asthma   ? Diabetes mellitus without complication ([place])   ? Gout   ? Hyperlipidemia   ? Hypertension   ? Hypertensive  urgency 06/24/2021  ? Lower extremity edema 01/19/2022  ? Obesity   ? Obesity 06/24/2021  ? Pulmonary edema 06/24/2021  ? ? ?Past Surgical History:  ?Procedure Laterality Date  ? BIOPSY  02/15/2021  ? Procedure: BIOPSY;  Surgeon: Ronnette Juniper, MD;  Location: [facility];  Service: Gastroenterology;;  ? COLONOSCOPY WITH PROPOFOL N/A 02/15/2021  ? Procedure: COLONOSCOPY WITH PROPOFOL;  Surgeon: Ronnette Juniper, MD;  Location: [facility];  Service: Gastroenterology;  Laterality: N/A;  ? ESOPHAGOGASTRODUODENOSCOPY (EGD) WITH PROPOFOL N/A 02/15/2021  ? Procedure: ESOPHAGOGASTRODUODENOSCOPY (EGD) WITH PROPOFOL;  Surgeon: Ronnette Juniper, MD;  Location: [facility];  Service: Gastroenterology;  Laterality: N/A;  ? GANGLION CYST EXCISION Right 02/26/2020  ? Procedure: EXCISION TOPHUS RIGHT FOOT;  Surgeon: Evelina Bucy, DPM;  Location: [facility];  Service: Podiatry;  Laterality: Right;  ? NO PAST SURGERIES    ? ? ?Family History  ?Problem Relation Age of Onset  ? Asthma Mother   ? Cancer Mother   ? Diabetes Mother   ? Obesity Mother   ? Obesity Sister   ? Diabetes Brother   ? Obesity Brother   ? Cancer Maternal Grandmother   ? Diabetes Maternal Grandmother   ? Diabetes Other   ? ? ?  ?Social History  ? ?Socioeconomic History  ? Marital status: Single  ?  Spouse name: Not on file  ? Number of children: Not  on file  ? Years of education: Not on file  ? Highest education level: Not on file  ?Occupational History  ? Not on file  ?Tobacco Use  ? Smoking status: Every Day  ?  Packs/day: 1.00  ?  Types: Cigarettes  ? Smokeless tobacco: Never  ?Vaping Use  ? Vaping Use: Some days  ?Substance and Sexual Activity  ? Alcohol use: Yes  ? Drug use: Yes  ?  Frequency: 1.0 times per week  ?  Types: Marijuana  ?  Comment: marijuana at midnight last night; daily  ? Sexual activity: Yes  ?  Comment: accepted condoms  ?Other Topics Concern  ? Not on file  ?Social History Narrative  ? Not on file  ? ?Social Determinants of Health   ? ?Financial Resource Strain: Not on file  ?Food Insecurity: Not on file  ?Transportation Needs: Not on file  ?Physical Activity: Not on file  ?Stress: Not on file  ?Social Connections: Not on file  ? ? ?Allergies  ?Allergen Reactions  ? Lactose Intolerance (Gi) Nausea And Vomiting  ? ? ? ?Current Outpatient Medications:  ?  acetaminophen (TYLENOL) 325 MG tablet, Take 1 tablet (325 mg total) by mouth every 4 (four) hours as needed for mild pain., Disp: 100 tablet, Rfl: 2 ?  aspirin EC 81 MG EC tablet, Take 1 tablet (81 mg total) by mouth daily. Swallow whole., Disp: 30 tablet, Rfl: 11 ?  atorvastatin (LIPITOR) 10 MG tablet, , Disp: , Rfl:  ?  clonazePAM (KLONOPIN) 0.5 MG tablet, Take 1 tablet (0.5 mg total) by mouth 3 (three) times daily., Disp: 90 tablet, Rfl: 2 ?  colchicine 0.6 MG tablet, For an acute gout attack take two tablets and then another one tablet an hour later then resume one tablet daily for prevention, Disp: 42 tablet, Rfl: 11 ?  cyanocobalamin 1000 MCG tablet, Take 1 tablet (1,000 mcg total) by mouth daily., Disp: 100 tablet, Rfl: 2 ?  doxycycline (VIBRAMYCIN) 100 MG capsule, Take 100 mg by mouth 2 (two) times daily., Disp: , Rfl:  ?  famotidine (PEPCID) 40 MG tablet, , Disp: , Rfl:  ?  FARXIGA 10 MG TABS tablet, Take 10 mg by mouth daily., Disp: , Rfl:  ?  ferrous sulfate 325 (65 FE) MG EC tablet, , Disp: , Rfl:  ?  fluticasone furoate-vilanterol (BREO ELLIPTA) 100-25 MCG/ACT AEPB, Inhale into the lungs., Disp: , Rfl:  ?  gabapentin (NEURONTIN) 600 MG tablet, Take 1 tablet (600 mg total) by mouth 3 (three) times daily., Disp: 90 tablet, Rfl: 3 ?  insulin glargine (LANTUS SOLOSTAR) 100 UNIT/ML Solostar Pen, Inject 5 Units into the skin daily., Disp: 15 mL, Rfl: 0 ?  Insulin Pen Needle 32G X 4 MM MISC, use as directed, Disp: 100 each, Rfl: 0 ?  labetalol (NORMODYNE) 100 MG tablet, Take by mouth., Disp: , Rfl:  ?  levocetirizine (XYZAL) 5 MG tablet, Take 5 mg by mouth daily., Disp: , Rfl:  ?   magnesium oxide (MAG-OX) 400 MG tablet, Take 1 tablet (400 mg total) by mouth 2 (two) times daily., Disp: 60 tablet, Rfl: 0 ?  methocarbamol (ROBAXIN) 500 MG tablet, Take 1 tablet (500 mg total) by mouth every 6 (six) hours as needed for muscle spasms., Disp: 120 tablet, Rfl: 0 ?  methylPREDNISolone (MEDROL DOSEPAK) 4 MG TBPK tablet, 6 day dose pack - take as directed, Disp: 21 tablet, Rfl: 0 ?  olmesartan (BENICAR) 20 MG tablet, Take 20 mg by  mouth daily., Disp: , Rfl:  ?  omeprazole (PRILOSEC) 20 MG capsule, Take 20 mg by mouth daily., Disp: , Rfl:  ?  Oxycodone HCl 10 MG TABS, Take 1 tablet (10 mg total) by mouth every 4 (four) hours as needed. MAX 5 TABS PER DAY AS NEEDED FOR SEVERE PAIN, Disp: 25 tablet, Rfl: 0 ?  SYMBICORT 160-4.5 MCG/ACT inhaler, Inhale into the lungs., Disp: , Rfl:  ?  tamsulosin (FLOMAX) 0.4 MG CAPS capsule, Take 1 capsule (0.4 mg total) by mouth daily after supper., Disp: 30 capsule, Rfl: 0 ?  traZODone (DESYREL) 50 MG tablet, Take 50 mg by mouth at bedtime., Disp: , Rfl:  ?  dolutegravir-lamiVUDine (DOVATO) 50-300 MG tablet, Take 1 tablet by mouth daily., Disp: 30 tablet, Rfl: 11 ? ?Review of Systems  ?Constitutional:  Negative for activity change, appetite change, chills, diaphoresis, fatigue, fever and unexpected weight change.  ?HENT:  Negative for congestion, rhinorrhea, sinus pressure, sneezing, sore throat and trouble swallowing.   ?Eyes:  Negative for photophobia and visual disturbance.  ?Respiratory:  Negative for cough, chest tightness, shortness of breath, wheezing and stridor.   ?Cardiovascular:  Negative for chest pain, palpitations and leg swelling.  ?Gastrointestinal:  Negative for abdominal distention, abdominal pain, anal bleeding, blood in stool, constipation, diarrhea, nausea and vomiting.  ?Genitourinary:  Negative for difficulty urinating, dysuria, flank pain and hematuria.  ?Musculoskeletal:  Negative for arthralgias, back pain, gait problem, joint swelling and  myalgias.  ?Skin:  Negative for color change, pallor, rash and wound.  ?Neurological:  Negative for dizziness, tremors, weakness and light-headedness.  ?Hematological:  Negative for adenopathy. Does not bruise/bleed

## 2022-01-20 ENCOUNTER — Other Ambulatory Visit: Payer: Self-pay | Admitting: Internal Medicine

## 2022-01-20 ENCOUNTER — Ambulatory Visit
Admission: RE | Admit: 2022-01-20 | Discharge: 2022-01-20 | Disposition: A | Discharge status: Home / Self Care | Payer: Medicaid Other | Source: Ambulatory Visit | Attending: Internal Medicine | Admitting: Internal Medicine

## 2022-01-20 DIAGNOSIS — R339 Retention of urine, unspecified: Secondary | ICD-10-CM

## 2022-01-20 IMAGING — US US RENAL
1 series · 14 of 25 positions shown · non-contrast
Comparison: Renal ultrasound [DATE], abdominopelvic CT
[DATE]

CLINICAL DATA: Urinary retention.

EXAM:
RENAL / URINARY TRACT ULTRASOUND COMPLETE

[Series 1: us renal · 0.33mm/px · 14 of 43 slices shown]
[im 1/43]
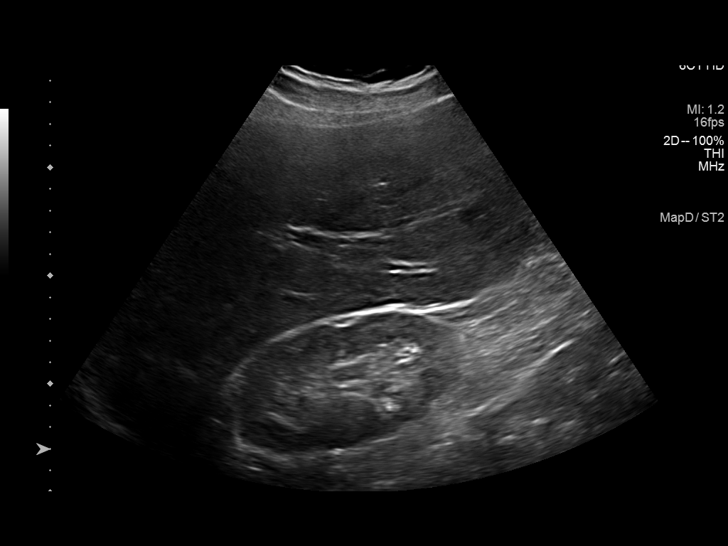
[im 4/43]
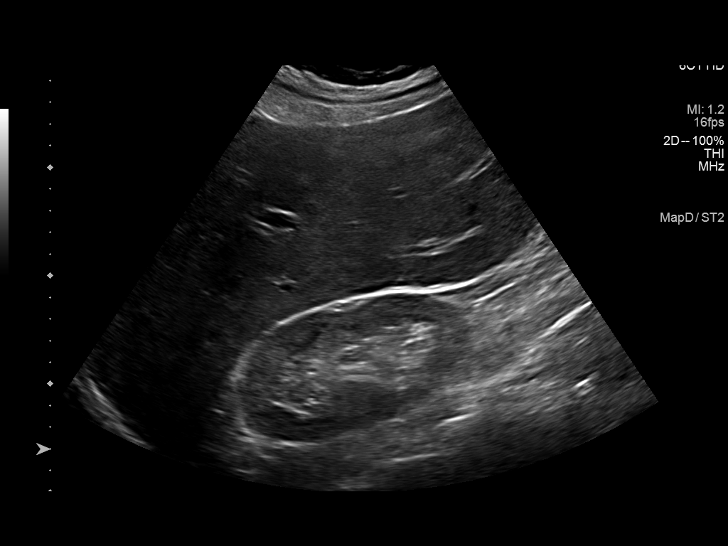
[im 8/43]
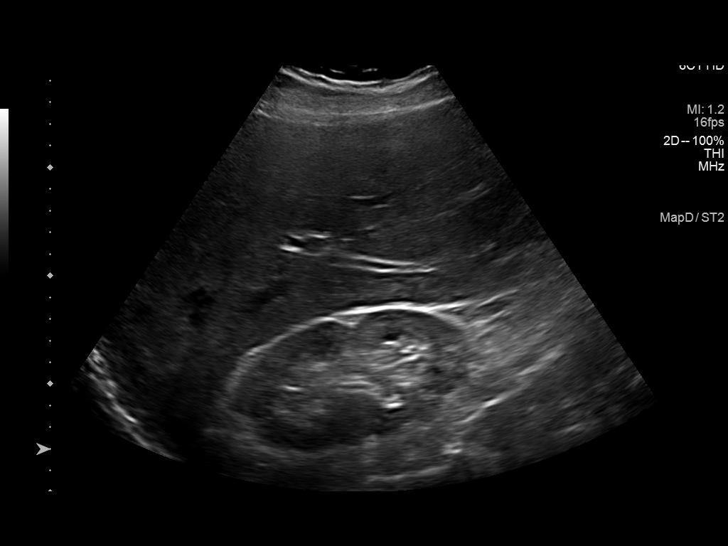
[im 11/43]
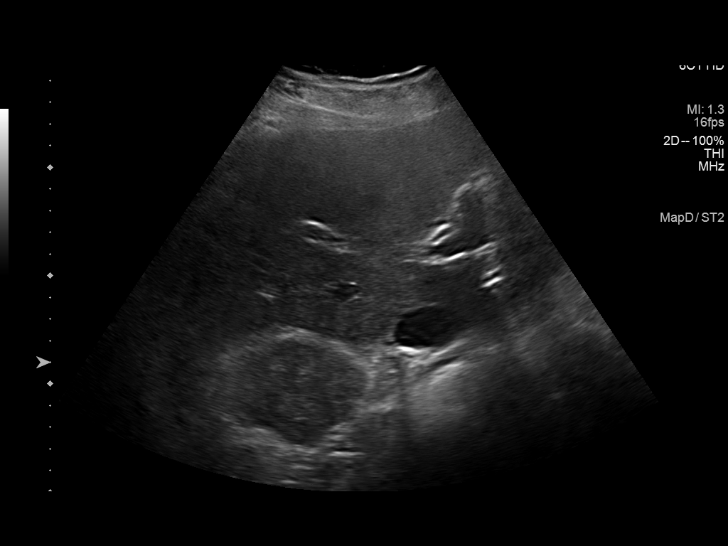
[im 15/43]
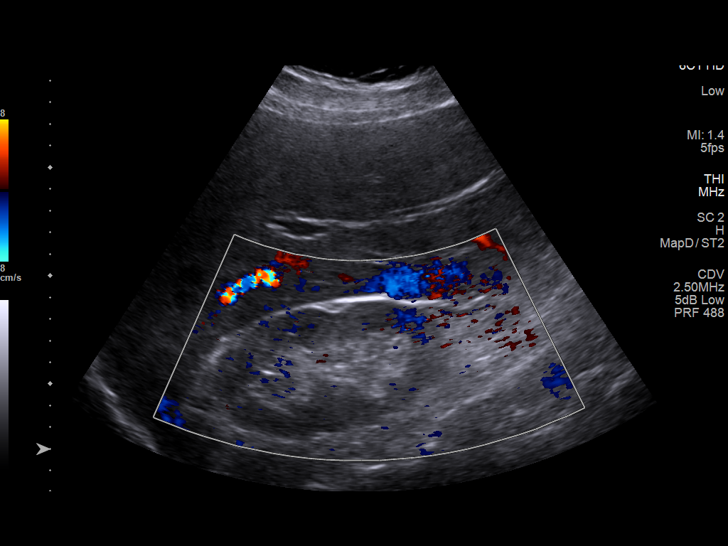
[im 16/43]
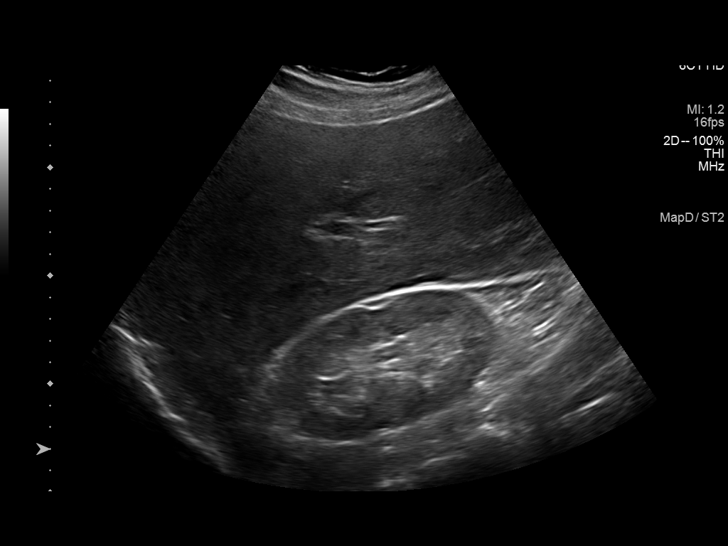
[im 20/43]
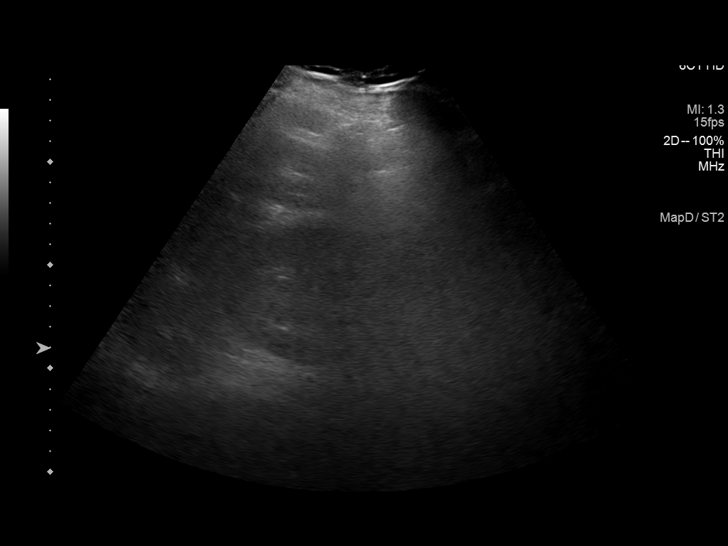
[im 23/43]
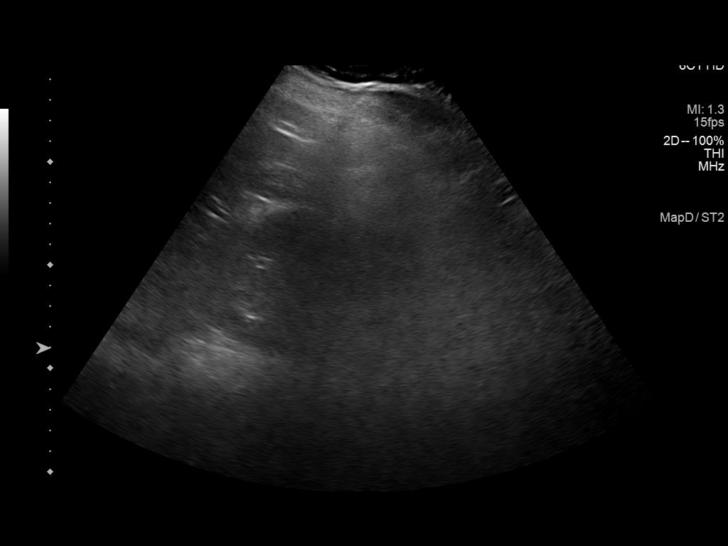
[im 27/43]
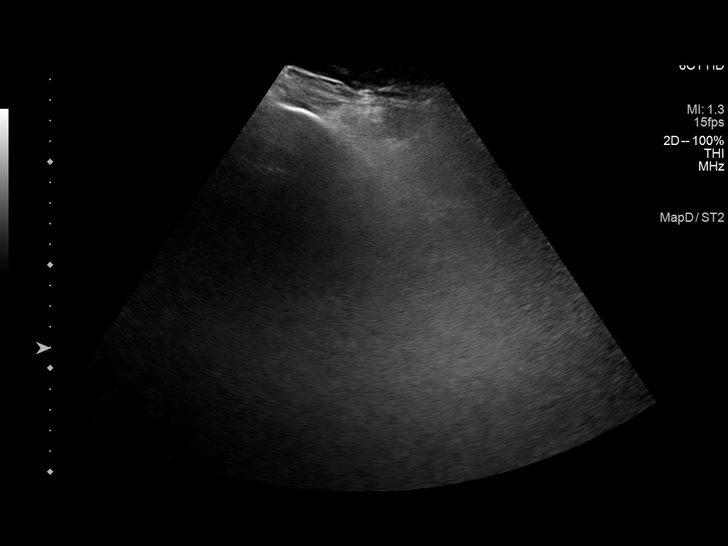
[im 29/43]
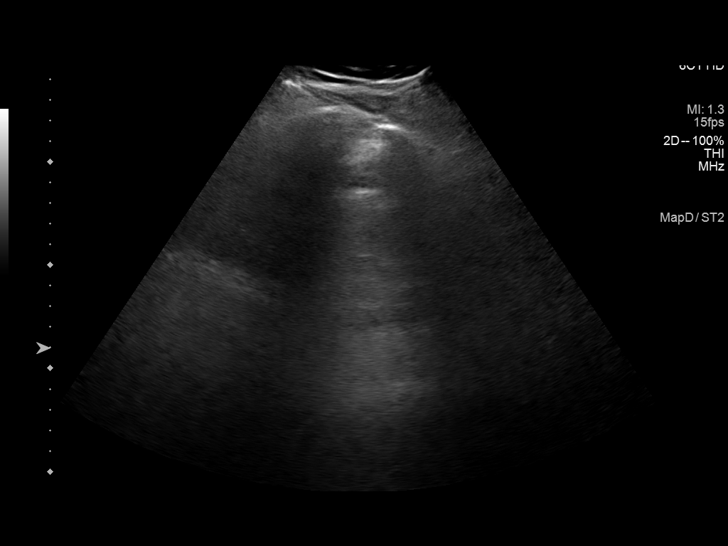
[im 32/43]
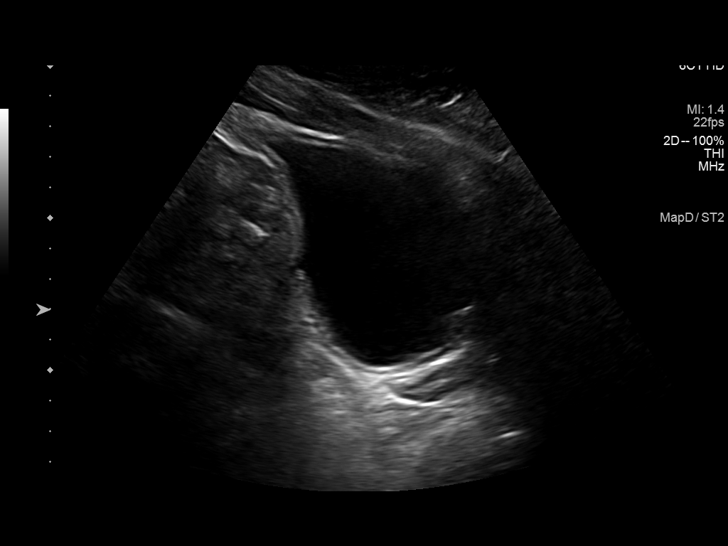
[im 36/43]
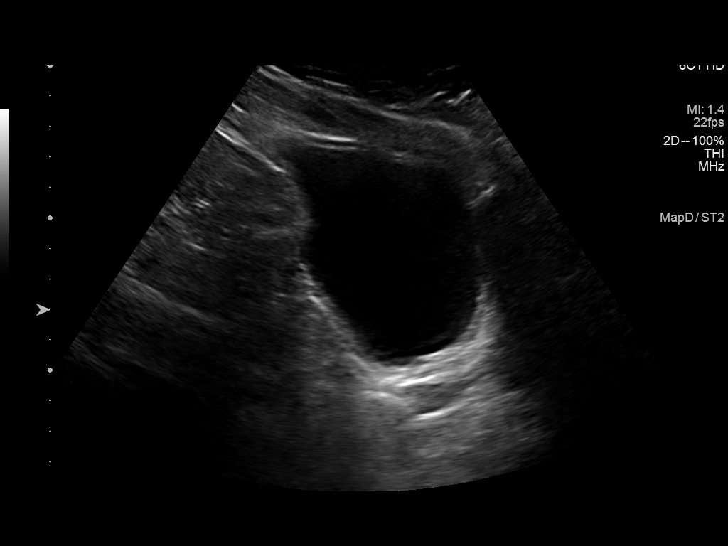
[im 39/43]
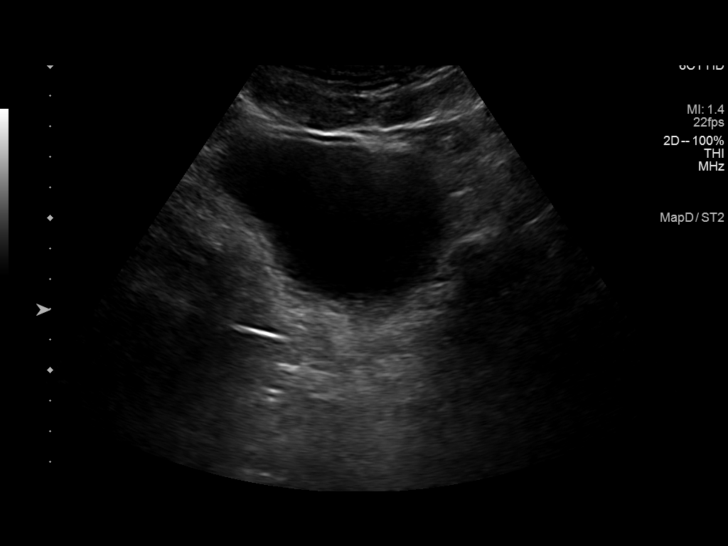
[im 43/43]
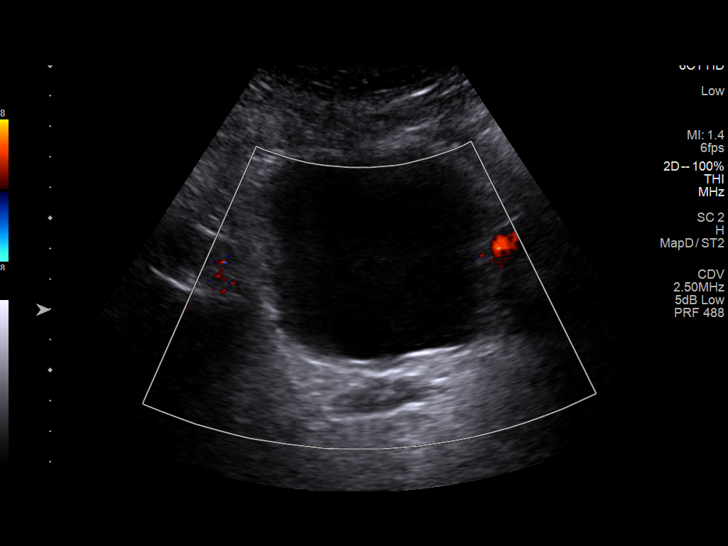

[14 of 25 positions shown; findings below may reference images not displayed]

FINDINGS: Right Kidney:

Renal measurements: 11.4 x 5.7 x 5.9 cm = volume: 200 mL. Mild
increased renal parenchymal echogenicity. No hydronephrosis. No
evidence of focal renal lesion or stone.

Left Kidney:

Not well delineated due to overlying shadowing and body habitus. The
kidney cannot be evaluated.

Bladder:

Appears normal for degree of bladder distention. Bladder volume is
approximately 240 cm^3. No bladder wall thickening.

Other:

None.
IMPRESSION: 1. Increased right renal echogenicity consistent with chronic
medical renal disease.
2. The left kidney was not well delineated and cannot be evaluated
due to shadowing and habitus.
3. Normal sonographic appearance of the urinary bladder.

## 2022-01-21 LAB — T-HELPER CELLS (CD4) COUNT (NOT AT ARMC)
CD4 % Helper T Cell: 30 % — ABNORMAL LOW (ref 33–65)
CD4 T Cell Abs: 382 /uL — ABNORMAL LOW (ref 400–1790)

## 2022-01-24 ENCOUNTER — Other Ambulatory Visit (HOSPITAL_COMMUNITY): Payer: Self-pay

## 2022-01-24 LAB — CBC WITH DIFFERENTIAL/PLATELET
Absolute Monocytes: 1232 cells/uL — ABNORMAL HIGH (ref 200–950)
Basophils Absolute: 10 cells/uL (ref 0–200)
Basophils Relative: 0.1 %
Eosinophils Absolute: 320 cells/uL (ref 15–500)
Eosinophils Relative: 3.3 %
HCT: 42.1 % (ref 38.5–50.0)
Hemoglobin: 14 g/dL (ref 13.2–17.1)
Lymphs Abs: 1300 cells/uL (ref 850–3900)
MCH: 32.6 pg (ref 27.0–33.0)
MCHC: 33.3 g/dL (ref 32.0–36.0)
MCV: 98.1 fL (ref 80.0–100.0)
MPV: 11.4 fL (ref 7.5–12.5)
Monocytes Relative: 12.7 %
Neutro Abs: 6839 cells/uL (ref 1500–7800)
Neutrophils Relative %: 70.5 %
Platelets: 181 10*3/uL (ref 140–400)
RBC: 4.29 10*6/uL (ref 4.20–5.80)
RDW: 12.9 % (ref 11.0–15.0)
Total Lymphocyte: 13.4 %
WBC: 9.7 10*3/uL (ref 3.8–10.8)

## 2022-01-24 LAB — COMPLETE METABOLIC PANEL WITH GFR
AG Ratio: 1.6 (calc) (ref 1.0–2.5)
ALT: 20 U/L (ref 9–46)
AST: 17 U/L (ref 10–40)
Albumin: 4.1 g/dL (ref 3.6–5.1)
Alkaline phosphatase (APISO): 105 U/L (ref 36–130)
BUN/Creatinine Ratio: 16 (calc) (ref 6–22)
BUN: 48 mg/dL — ABNORMAL HIGH (ref 7–25)
CO2: 22 mmol/L (ref 20–32)
Calcium: 9 mg/dL (ref 8.6–10.3)
Chloride: 112 mmol/L — ABNORMAL HIGH (ref 98–110)
Creat: 3.05 mg/dL — ABNORMAL HIGH (ref 0.60–1.29)
Globulin: 2.6 g/dL (calc) (ref 1.9–3.7)
Glucose, Bld: 155 mg/dL — ABNORMAL HIGH (ref 65–99)
Potassium: 5.1 mmol/L (ref 3.5–5.3)
Sodium: 140 mmol/L (ref 135–146)
Total Bilirubin: 0.2 mg/dL (ref 0.2–1.2)
Total Protein: 6.7 g/dL (ref 6.1–8.1)
eGFR: 25 mL/min/{1.73_m2} — ABNORMAL LOW (ref >=60)

## 2022-01-24 LAB — HEMOGLOBIN A1C
Hgb A1c MFr Bld: 6.3 % of total Hgb — ABNORMAL HIGH (ref <5.7)
Mean Plasma Glucose: 134 mg/dL
eAG (mmol/L): 7.4 mmol/L

## 2022-01-24 LAB — HIV-1 RNA QUANT-NO REFLEX-BLD
HIV 1 RNA Quant: <20 copies/mL — AB
HIV-1 RNA Quant, Log: <1.3 Log copies/mL — AB

## 2022-01-24 LAB — RPR: RPR Ser Ql: NONREACTIVE

## 2022-02-01 ENCOUNTER — Ambulatory Visit: Payer: Self-pay | Admitting: General Surgery

## 2022-02-14 ENCOUNTER — Other Ambulatory Visit (HOSPITAL_COMMUNITY): Payer: Self-pay

## 2022-02-15 ENCOUNTER — Other Ambulatory Visit (HOSPITAL_COMMUNITY): Payer: Self-pay

## 2022-02-16 ENCOUNTER — Other Ambulatory Visit (HOSPITAL_COMMUNITY): Payer: Self-pay

## 2022-02-17 NOTE — Patient Instructions (Addendum)
DUE TO COVID-19 ONLY TWO VISITORS  (aged 45 and older)  ARE ALLOWED TO COME WITH YOU AND STAY IN THE WAITING ROOM ONLY DURING PRE OP AND PROCEDURE.   **NO VISITORS ARE ALLOWED IN THE SHORT STAY AREA OR RECOVERY ROOM!!**  IF YOU WILL BE ADMITTED INTO THE HOSPITAL YOU ARE ALLOWED ONLY FOUR SUPPORT PEOPLE DURING VISITATION HOURS ONLY (7 AM -8PM)   The support person(s) must pass our screening, gel in and out, and wear a mask at all times, including in the patient's room. Patients must also wear a mask when staff or their support person are in the room. Visitors GUEST BADGE MUST BE WORN VISIBLY  One adult visitor may remain with you overnight and MUST be in the room by 8 P.M.     Your procedure is scheduled on: 03/03/22   Report to [facility] Main Entrance    Report to admitting at   6:15 AM   Call this number if you have problems the morning of surgery [phone]   Do not eat food :After Midnight.   After Midnight you may have the following liquids until _5:30_ AM/  DAY OF SURGERY  Water Black Coffee (sugar ok, NO MILK/CREAM OR CREAMERS)  Tea (sugar ok, NO MILK/CREAM OR CREAMERS) regular and decaf                             Plain Jell-O (NO RED)                                           Fruit ices (not with fruit pulp, NO RED)                                     Popsicles (NO RED)                                                                  Juice: apple, WHITE grape, WHITE cranberry Sports drinks like Gatorade (NO RED) Clear broth(vegetable,chicken,beef)                   The day of surgery:  Drink ONE  G2 at  5:15 AM the morning of surgery. Drink in one sitting. Do not sip.  This drink was given to you during your hospital  pre-op appointment visit. Nothing else to drink after completing the  G2. At 5:30 AM          If you have questions, please contact your surgeon's office.      Oral Hygiene is also important to reduce your risk of infection.                                     Remember - BRUSH YOUR TEETH THE MORNING OF SURGERY WITH YOUR REGULAR TOOTHPASTE    Do NOT smoke after Midnight             Use your inhalers ans usual and  bing them with you   Take these medicines the morning of surgery with A SIP OF WATER: Clonazepam, Xyzal, Labetalol, Gabapentin, Tamsulosin, Omeprazole Stop ASA 02/25/22  How to Manage Your Diabetes Before and After Surgery  Why is it important to control my blood sugar before and after surgery? Improving blood sugar levels before and after surgery helps healing and can limit problems. A way of improving blood sugar control is eating a healthy diet by:  Eating less sugar and carbohydrates  Increasing activity/exercise  Talking with your doctor about reaching your blood sugar goals High blood sugars (greater than 180 mg/dL) can raise your risk of infections and slow your recovery, so you will need to focus on controlling your diabetes during the weeks before surgery. Make sure that the doctor who takes care of your diabetes knows about your planned surgery including the date and location.  How do I manage my blood sugar before surgery? Check your blood sugar at least 4 times a day, starting 2 days before surgery, to make sure that the level is not too high or low. Check your blood sugar the morning of your surgery when you wake up and every 2 hours until you get to the Short Stay unit. If your blood sugar is less than 70 mg/dL, you will need to treat for low blood sugar: Do not take insulin. Treat a low blood sugar (less than 70 mg/dL) with  cup of clear juice (cranberry or apple), 4 glucose tablets, OR glucose gel. Recheck blood sugar in 15 minutes after treatment (to make sure it is greater than 70 mg/dL). If your blood sugar is not greater than 70 mg/dL on recheck, call [phone] for further instructions. Report your blood sugar to the short stay nurse when you get to Short Stay.  If you are admitted to the  hospital after surgery: Your blood sugar will be checked by the staff and you will probably be given insulin after surgery (instead of oral diabetes medicines) to make sure you have good blood sugar levels. The goal for blood sugar control after surgery is 80-180 mg/dL.   WHAT DO I DO ABOUT MY DIABETES MEDICATION?  The day before surgery take 5 units of Glargine insulin in the morning.  Do not take oral diabetes medicines (pills) the morning of surgery.       THE MORNING OF SURGERY, take  2.5  units of  Glargine    insulin.  The day of surgery, do not take other diabetes injectables, including Byetta (exenatide), Bydureon (exenatide ER), Victoza (liraglutide), or Trulicity (dulaglutide).   Bring CPAP mask and tubing day of surgery.                              You may not have any metal on your body including  jewelry, and body piercing             Do not wear lotions, powders, perfumes/cologne, or deodorant                Men may shave face and neck.   Do not bring valuables to the hospital. [place].   Contacts, dentures or bridgework may not be worn into surgery.       Patients discharged on the day of surgery will not be allowed to drive home.  Someone NEEDS to stay with you for the first 24 hours after anesthesia.   Special Instructions: Bring a copy of your healthcare power of attorney and living will documents the day of surgery if you haven't scanned them before.              Please read over the following fact sheets you were given: IF YOU HAVE QUESTIONS ABOUT YOUR PRE-OP INSTRUCTIONS PLEASE CALL [phone]     [facility] - Preparing for Surgery Before surgery, you can play an important role.  Because skin is not sterile, your skin needs to be as free of germs as possible.  You can reduce the number of germs on your skin by washing with CHG (chlorahexidine gluconate) soap before surgery.  CHG is an antiseptic  cleaner which kills germs and bonds with the skin to continue killing germs even after washing. Please DO NOT use if you have an allergy to CHG or antibacterial soaps.  If your skin becomes reddened/irritated stop using the CHG and inform your nurse when you arrive at Short Stay.  You may shave your face/neck. Please follow these instructions carefully:  1.  Shower with CHG Soap the night before surgery and the  morning of Surgery.  2.  If you choose to wash your hair, wash your hair first as usual with your  normal  shampoo.  3.  After you shampoo, rinse your hair and body thoroughly to remove the  shampoo.                 4.  Use CHG as you would any other liquid soap.  You can apply chg directly  to the skin and wash                       Gently with a scrungie or clean washcloth.  5.  Apply the CHG Soap to your body ONLY FROM THE NECK DOWN.   Do not use on face/ open                           Wound or open sores. Avoid contact with eyes, ears mouth and genitals (private parts).                       Wash face,  Genitals (private parts) with your normal soap.             6.  Wash thoroughly, paying special attention to the area where your surgery  will be performed.  7.  Thoroughly rinse your body with warm water from the neck down.  8.  DO NOT shower/wash with your normal soap after using and rinsing off  the CHG Soap.                9.  Pat yourself dry with a clean towel.            10.  Wear clean pajamas.            11.  Place clean sheets on your bed the night of your first shower and do not  sleep with pets. Day of Surgery : Do not apply any lotions/deodorants the morning of surgery.  Please wear clean clothes to the hospital/surgery center.  FAILURE TO FOLLOW THESE INSTRUCTIONS MAY RESULT IN THE CANCELLATION OF YOUR SURGERY _   ________________________________________________________________________

## 2022-02-18 ENCOUNTER — Encounter (HOSPITAL_COMMUNITY)
Admission: RE | Admit: 2022-02-18 | Discharge: 2022-02-18 | Disposition: A | Discharge status: Home / Self Care | Payer: Medicaid Other | Source: Ambulatory Visit | Attending: General Surgery | Admitting: General Surgery

## 2022-02-18 ENCOUNTER — Encounter (HOSPITAL_COMMUNITY): Payer: Self-pay

## 2022-02-18 ENCOUNTER — Other Ambulatory Visit: Payer: Self-pay

## 2022-02-18 DIAGNOSIS — Z01812 Encounter for preprocedural laboratory examination: Secondary | ICD-10-CM | POA: Diagnosis present

## 2022-02-18 DIAGNOSIS — Z794 Long term (current) use of insulin: Secondary | ICD-10-CM | POA: Insufficient documentation

## 2022-02-18 DIAGNOSIS — E1165 Type 2 diabetes mellitus with hyperglycemia: Secondary | ICD-10-CM | POA: Insufficient documentation

## 2022-02-18 LAB — BASIC METABOLIC PANEL
Anion gap: 8 (ref 5–15)
BUN: 37 mg/dL — ABNORMAL HIGH (ref 6–20)
CO2: 20 mmol/L — ABNORMAL LOW (ref 22–32)
Calcium: 9.2 mg/dL (ref 8.9–10.3)
Chloride: 114 mmol/L — ABNORMAL HIGH (ref 98–111)
Creatinine, Ser: 1.97 mg/dL — ABNORMAL HIGH (ref 0.61–1.24)
GFR, Estimated: 42 mL/min — ABNORMAL LOW (ref >60)
Glucose, Bld: 185 mg/dL — ABNORMAL HIGH (ref 70–99)
Potassium: 4.6 mmol/L (ref 3.5–5.1)
Sodium: 142 mmol/L (ref 135–145)

## 2022-02-18 LAB — CBC
HCT: 44.6 % (ref 39.0–52.0)
Hemoglobin: 14.2 g/dL (ref 13.0–17.0)
MCH: 33.1 pg (ref 26.0–34.0)
MCHC: 31.8 g/dL (ref 30.0–36.0)
MCV: 104 fL — ABNORMAL HIGH (ref 80.0–100.0)
Platelets: 144 10*3/uL — ABNORMAL LOW (ref 150–400)
RBC: 4.29 MIL/uL (ref 4.22–5.81)
RDW: 14.2 % (ref 11.5–15.5)
WBC: 9.9 10*3/uL (ref 4.0–10.5)
nRBC: 0 % (ref 0.0–0.2)

## 2022-02-18 LAB — HEMOGLOBIN A1C
Hgb A1c MFr Bld: 7 % — ABNORMAL HIGH (ref 4.8–5.6)
Mean Plasma Glucose: 154.2 mg/dL

## 2022-02-18 LAB — GLUCOSE, CAPILLARY: Glucose-Capillary: 168 mg/dL — ABNORMAL HIGH (ref 70–99)

## 2022-02-18 NOTE — Progress Notes (Signed)
Anesthesia note:  Bowel prep reminder:NA  PCP - Dr. Harlow Ohms Cardiologist -Dr. Lendell Caprice Other- Infectious disease- Dr. Rogene [place]  Chest x-ray - no EKG - 06/30/21-epic Stress Test - no ECHO - 10/28/21-epic Cardiac Cath - no  Pacemaker/ICD device last checked:NA  Sleep Study - no Stop bang score 5 CPAP - no   Fasting Blood Sugar - 100-198 Checks Blood Sugar __QD___He just started with continual blood sugar monitoring this week it is on the Left arm. He thinks it will be on the Lt for DOS.  Blood Thinner:ASA 81 mg/ Dr. Irish Lack Blood Thinner Instructions: Aspirin Instructions:stop 5 days 6/23 Last Dose:6/23  Anesthesia review: yes  Patient denies shortness of breath, fever, cough and chest pain at PAT appointment Pt has HIV.  He smokes cigarettes and Marijuana through out the day and will try to cut down.He uses inhalers daily. Patient verbalized understanding of instructions that were given to them at the PAT appointment. Patient was also instructed that they will need to review over the PAT instructions again at home before surgery. yes

## 2022-02-21 ENCOUNTER — Other Ambulatory Visit (HOSPITAL_COMMUNITY): Payer: Self-pay

## 2022-02-22 ENCOUNTER — Other Ambulatory Visit (HOSPITAL_COMMUNITY): Payer: Self-pay

## 2022-02-22 ENCOUNTER — Other Ambulatory Visit: Payer: Self-pay | Admitting: Physical Medicine and Rehabilitation

## 2022-02-22 MED ORDER — CYANOCOBALAMIN 1000 MCG PO TABS
1000.0000 ug | ORAL_TABLET | Freq: Every day | ORAL | 2 refills | Status: AC
  Filled 2022-02-22 – 2022-02-23 (×2): qty 100, 100d supply, fill #0

## 2022-02-23 ENCOUNTER — Other Ambulatory Visit (HOSPITAL_COMMUNITY): Payer: Self-pay

## 2022-02-24 ENCOUNTER — Other Ambulatory Visit (HOSPITAL_COMMUNITY): Payer: Self-pay

## 2022-03-02 NOTE — Anesthesia Preprocedure Evaluation (Signed)
Anesthesia Evaluation  Patient identified by MRN, date of birth, ID band Patient awake    Reviewed: Allergy & Precautions, NPO status , Patient's Chart, lab work & pertinent test results, reviewed documented beta blocker date and time   Airway Mallampati: IV  TM Distance: >3 FB Neck ROM: Full    Dental  (+) Teeth Intact, Dental Advisory Given   Pulmonary asthma , COPD,  COPD inhaler, Current SmokerPatient did not abstain from smoking.,    Pulmonary exam normal breath sounds clear to auscultation       Cardiovascular hypertension, Pt. on home beta blockers and Pt. on medications +CHF  Normal cardiovascular exam Rhythm:Regular Rate:Normal     Neuro/Psych PSYCHIATRIC DISORDERS Anxiety negative neurological ROS     GI/Hepatic GERD  Medicated,(+)     substance abuse  marijuana use,   Endo/Other  diabetes, Type 2, Oral Hypoglycemic Agents, Insulin DependentObesity    Renal/GU Renal InsufficiencyRenal disease     Musculoskeletal negative musculoskeletal ROS (+)   Abdominal   Peds  Hematology  (+) Blood dyscrasia (Thrombocytopenia), , HIV,   Anesthesia Other Findings   Reproductive/Obstetrics                            Anesthesia Physical Anesthesia Plan  ASA: 3  Anesthesia Plan: General   Post-op Pain Management: Tylenol PO (pre-op)* and Celebrex PO (pre-op)*   Induction: Intravenous  PONV Risk Score and Plan: 3 and Midazolam, Dexamethasone and Ondansetron  Airway Management Planned: Oral ETT  Additional Equipment:   Intra-op Plan:   Post-operative Plan: Extubation in OR  Informed Consent: I have reviewed the patients History and Physical, chart, labs and discussed the procedure including the risks, benefits and alternatives for the proposed anesthesia with the patient or authorized representative who has indicated his/her understanding and acceptance.     Dental advisory  given  Plan Discussed with: CRNA  Anesthesia Plan Comments:        Anesthesia Quick Evaluation

## 2022-03-03 ENCOUNTER — Encounter (HOSPITAL_COMMUNITY): Payer: Self-pay | Admitting: General Surgery

## 2022-03-03 ENCOUNTER — Ambulatory Visit (HOSPITAL_BASED_OUTPATIENT_CLINIC_OR_DEPARTMENT_OTHER): Payer: Medicaid Other | Admitting: Certified Registered Nurse Anesthetist

## 2022-03-03 ENCOUNTER — Ambulatory Visit (HOSPITAL_COMMUNITY)
Admission: RE | Admit: 2022-03-03 | Discharge: 2022-03-03 | Disposition: A | Discharge status: Home / Self Care | Payer: Medicaid Other | Source: Ambulatory Visit | Attending: General Surgery | Admitting: General Surgery

## 2022-03-03 ENCOUNTER — Encounter (HOSPITAL_COMMUNITY)
Admission: RE | Disposition: A | Discharge status: Home / Self Care | Payer: Self-pay | Source: Ambulatory Visit | Attending: General Surgery

## 2022-03-03 ENCOUNTER — Ambulatory Visit (HOSPITAL_COMMUNITY): Payer: Medicaid Other | Admitting: Physician Assistant

## 2022-03-03 DIAGNOSIS — Z6835 Body mass index (BMI) 35.0-35.9, adult: Secondary | ICD-10-CM | POA: Insufficient documentation

## 2022-03-03 DIAGNOSIS — E1122 Type 2 diabetes mellitus with diabetic chronic kidney disease: Secondary | ICD-10-CM | POA: Insufficient documentation

## 2022-03-03 DIAGNOSIS — J449 Chronic obstructive pulmonary disease, unspecified: Secondary | ICD-10-CM

## 2022-03-03 DIAGNOSIS — F17210 Nicotine dependence, cigarettes, uncomplicated: Secondary | ICD-10-CM

## 2022-03-03 DIAGNOSIS — K429 Umbilical hernia without obstruction or gangrene: Secondary | ICD-10-CM | POA: Diagnosis present

## 2022-03-03 DIAGNOSIS — N184 Chronic kidney disease, stage 4 (severe): Secondary | ICD-10-CM | POA: Diagnosis not present

## 2022-03-03 DIAGNOSIS — I130 Hypertensive heart and chronic kidney disease with heart failure and stage 1 through stage 4 chronic kidney disease, or unspecified chronic kidney disease: Secondary | ICD-10-CM | POA: Insufficient documentation

## 2022-03-03 DIAGNOSIS — Z21 Asymptomatic human immunodeficiency virus [HIV] infection status: Secondary | ICD-10-CM | POA: Diagnosis not present

## 2022-03-03 DIAGNOSIS — K219 Gastro-esophageal reflux disease without esophagitis: Secondary | ICD-10-CM | POA: Insufficient documentation

## 2022-03-03 DIAGNOSIS — Z7984 Long term (current) use of oral hypoglycemic drugs: Secondary | ICD-10-CM | POA: Diagnosis not present

## 2022-03-03 DIAGNOSIS — E669 Obesity, unspecified: Secondary | ICD-10-CM | POA: Insufficient documentation

## 2022-03-03 DIAGNOSIS — I5042 Chronic combined systolic (congestive) and diastolic (congestive) heart failure: Secondary | ICD-10-CM

## 2022-03-03 DIAGNOSIS — I5022 Chronic systolic (congestive) heart failure: Secondary | ICD-10-CM | POA: Insufficient documentation

## 2022-03-03 DIAGNOSIS — I110 Hypertensive heart disease with heart failure: Secondary | ICD-10-CM | POA: Diagnosis not present

## 2022-03-03 DIAGNOSIS — Z794 Long term (current) use of insulin: Secondary | ICD-10-CM

## 2022-03-03 LAB — GLUCOSE, CAPILLARY
Glucose-Capillary: 155 mg/dL — ABNORMAL HIGH (ref 70–99)
Glucose-Capillary: 193 mg/dL — ABNORMAL HIGH (ref 70–99)

## 2022-03-03 SURGERY — REPAIR, HERNIA, UMBILICAL, ROBOT-ASSISTED
Anesthesia: General | Site: Abdomen

## 2022-03-03 MED ORDER — DEXAMETHASONE SODIUM PHOSPHATE 10 MG/ML IJ SOLN
INTRAMUSCULAR | Status: AC
  Filled 2022-03-03: qty 1

## 2022-03-03 MED ORDER — DEXAMETHASONE SODIUM PHOSPHATE 10 MG/ML IJ SOLN
INTRAMUSCULAR | Status: DC | PRN
  Administered 2022-03-03: 4 mg via INTRAVENOUS

## 2022-03-03 MED ORDER — FENTANYL CITRATE (PF) 100 MCG/2ML IJ SOLN
INTRAMUSCULAR | Status: AC
  Filled 2022-03-03: qty 2

## 2022-03-03 MED ORDER — SUGAMMADEX SODIUM 500 MG/5ML IV SOLN
INTRAVENOUS | Status: DC | PRN
  Administered 2022-03-03: 200 mg via INTRAVENOUS
  Administered 2022-03-03: 300 mg via INTRAVENOUS

## 2022-03-03 MED ORDER — ONDANSETRON HCL 4 MG/2ML IJ SOLN: 4.0000 mg | Freq: Once | INTRAMUSCULAR | Status: DC | PRN

## 2022-03-03 MED ORDER — PROPOFOL 10 MG/ML IV BOLUS
INTRAVENOUS | Status: AC
  Filled 2022-03-03: qty 20

## 2022-03-03 MED ORDER — CELECOXIB 200 MG PO CAPS
400.0000 mg | ORAL_CAPSULE | ORAL | Status: AC
  Administered 2022-03-03: 400 mg via ORAL
  Filled 2022-03-03: qty 2

## 2022-03-03 MED ORDER — KETAMINE HCL 10 MG/ML IJ SOLN
INTRAMUSCULAR | Status: DC | PRN
  Administered 2022-03-03: 30 mg via INTRAVENOUS

## 2022-03-03 MED ORDER — CHLORHEXIDINE GLUCONATE CLOTH 2 % EX PADS: 6.0000 | MEDICATED_PAD | Freq: Once | CUTANEOUS | Status: DC

## 2022-03-03 MED ORDER — KETAMINE HCL 10 MG/ML IJ SOLN
INTRAMUSCULAR | Status: AC
  Filled 2022-03-03: qty 1

## 2022-03-03 MED ORDER — PHENYLEPHRINE HCL-NACL 20-0.9 MG/250ML-% IV SOLN
INTRAVENOUS | Status: AC
  Filled 2022-03-03: qty 500

## 2022-03-03 MED ORDER — BUPIVACAINE LIPOSOME 1.3 % IJ SUSP
INTRAMUSCULAR | Status: DC | PRN
  Administered 2022-03-03: 20 mL

## 2022-03-03 MED ORDER — CHLORHEXIDINE GLUCONATE 0.12 % MT SOLN
15.0000 mL | Freq: Once | OROMUCOSAL | Status: AC
  Administered 2022-03-03: 15 mL via OROMUCOSAL

## 2022-03-03 MED ORDER — PROPOFOL 10 MG/ML IV BOLUS
INTRAVENOUS | Status: DC | PRN
  Administered 2022-03-03: 200 mg via INTRAVENOUS

## 2022-03-03 MED ORDER — ALBUTEROL SULFATE HFA 108 (90 BASE) MCG/ACT IN AERS
INHALATION_SPRAY | RESPIRATORY_TRACT | Status: DC | PRN
  Administered 2022-03-03: 2 via RESPIRATORY_TRACT

## 2022-03-03 MED ORDER — BUPIVACAINE-EPINEPHRINE 0.25% -1:200000 IJ SOLN
INTRAMUSCULAR | Status: DC | PRN
  Administered 2022-03-03: 50 mL

## 2022-03-03 MED ORDER — LIDOCAINE HCL (PF) 2 % IJ SOLN
INTRAMUSCULAR | Status: AC
  Filled 2022-03-03: qty 5

## 2022-03-03 MED ORDER — 0.9 % SODIUM CHLORIDE (POUR BTL) OPTIME
TOPICAL | Status: DC | PRN
  Administered 2022-03-03: 1000 mL

## 2022-03-03 MED ORDER — BUPIVACAINE LIPOSOME 1.3 % IJ SUSP
INTRAMUSCULAR | Status: AC
  Filled 2022-03-03: qty 20

## 2022-03-03 MED ORDER — SUCCINYLCHOLINE CHLORIDE 200 MG/10ML IV SOSY
PREFILLED_SYRINGE | INTRAVENOUS | Status: DC | PRN
  Administered 2022-03-03: 120 mg via INTRAVENOUS

## 2022-03-03 MED ORDER — BUPIVACAINE-EPINEPHRINE 0.25% -1:200000 IJ SOLN
INTRAMUSCULAR | Status: AC
  Filled 2022-03-03: qty 1

## 2022-03-03 MED ORDER — ONDANSETRON HCL 4 MG/2ML IJ SOLN
INTRAMUSCULAR | Status: DC | PRN
  Administered 2022-03-03: 4 mg via INTRAVENOUS

## 2022-03-03 MED ORDER — ROCURONIUM BROMIDE 10 MG/ML (PF) SYRINGE
PREFILLED_SYRINGE | INTRAVENOUS | Status: DC | PRN
  Administered 2022-03-03: 20 mg via INTRAVENOUS
  Administered 2022-03-03: 50 mg via INTRAVENOUS
  Administered 2022-03-03: 20 mg via INTRAVENOUS

## 2022-03-03 MED ORDER — HYDRALAZINE HCL 20 MG/ML IJ SOLN
10.0000 mg | Freq: Once | INTRAMUSCULAR | Status: AC
  Administered 2022-03-03: 10 mg via INTRAVENOUS

## 2022-03-03 MED ORDER — FENTANYL CITRATE PF 50 MCG/ML IJ SOSY
25.0000 ug | PREFILLED_SYRINGE | INTRAMUSCULAR | Status: DC | PRN
  Administered 2022-03-03: 50 ug via INTRAVENOUS
  Administered 2022-03-03 (×2): 25 ug via INTRAVENOUS

## 2022-03-03 MED ORDER — CEFAZOLIN IN SODIUM CHLORIDE 3-0.9 GM/100ML-% IV SOLN
3.0000 g | INTRAVENOUS | Status: AC
  Administered 2022-03-03: 3 g via INTRAVENOUS
  Filled 2022-03-03: qty 100

## 2022-03-03 MED ORDER — ORAL CARE MOUTH RINSE: 15.0000 mL | Freq: Once | OROMUCOSAL | Status: AC

## 2022-03-03 MED ORDER — OXYCODONE HCL 10 MG PO TABS: 10.0000 mg | ORAL_TABLET | Freq: Every day | ORAL | 0 refills | Status: AC | PRN

## 2022-03-03 MED ORDER — BUPIVACAINE LIPOSOME 1.3 % IJ SUSP: 20.0000 mL | Freq: Once | INTRAMUSCULAR | Status: DC

## 2022-03-03 MED ORDER — HYDRALAZINE HCL 20 MG/ML IJ SOLN
INTRAMUSCULAR | Status: AC
  Filled 2022-03-03: qty 1

## 2022-03-03 MED ORDER — LACTATED RINGERS IV SOLN
INTRAVENOUS | Status: DC
Start: 2022-03-03 — End: 2022-03-03

## 2022-03-03 MED ORDER — ACETAMINOPHEN 500 MG PO TABS
1000.0000 mg | ORAL_TABLET | ORAL | Status: AC
  Administered 2022-03-03: 1000 mg via ORAL
  Filled 2022-03-03: qty 2

## 2022-03-03 MED ORDER — LABETALOL HCL 5 MG/ML IV SOLN
INTRAVENOUS | Status: DC | PRN
  Administered 2022-03-03 (×2): 10 mg via INTRAVENOUS

## 2022-03-03 MED ORDER — ENSURE PRE-SURGERY PO LIQD
296.0000 mL | Freq: Once | ORAL | Status: DC
  Filled 2022-03-03: qty 296

## 2022-03-03 MED ORDER — ONDANSETRON HCL 4 MG/2ML IJ SOLN
INTRAMUSCULAR | Status: AC
  Filled 2022-03-03: qty 2

## 2022-03-03 MED ORDER — ROCURONIUM BROMIDE 10 MG/ML (PF) SYRINGE
PREFILLED_SYRINGE | INTRAVENOUS | Status: AC
  Filled 2022-03-03: qty 10

## 2022-03-03 MED ORDER — FENTANYL CITRATE (PF) 100 MCG/2ML IJ SOLN
INTRAMUSCULAR | Status: DC | PRN
  Administered 2022-03-03 (×4): 50 ug via INTRAVENOUS

## 2022-03-03 MED ORDER — LIDOCAINE 2% (20 MG/ML) 5 ML SYRINGE
INTRAMUSCULAR | Status: DC | PRN
  Administered 2022-03-03: 100 mg via INTRAVENOUS

## 2022-03-03 MED ORDER — SUGAMMADEX SODIUM 500 MG/5ML IV SOLN
INTRAVENOUS | Status: AC
  Filled 2022-03-03: qty 5

## 2022-03-03 MED ORDER — FENTANYL CITRATE PF 50 MCG/ML IJ SOSY
PREFILLED_SYRINGE | INTRAMUSCULAR | Status: AC
  Filled 2022-03-03: qty 2

## 2022-03-03 MED ORDER — MIDAZOLAM HCL 2 MG/2ML IJ SOLN
INTRAMUSCULAR | Status: AC
  Filled 2022-03-03: qty 2

## 2022-03-03 SURGICAL SUPPLY — 57 items
ADH SKN CLS APL DERMABOND .7 (GAUZE/BANDAGES/DRESSINGS) ×1
APL PRP STRL LF DISP 70% ISPRP (MISCELLANEOUS) ×1
BAG COUNTER SPONGE SURGICOUNT (BAG) ×2 IMPLANT
BAG SPNG CNTER NS LX DISP (BAG) ×1
BINDER ABDOMINAL 12 ML 46-62 (SOFTGOODS) ×1 IMPLANT
BLADE SURG SZ11 CARB STEEL (BLADE) ×2 IMPLANT
CANNULA REDUC XI 12-8 STAPL (CANNULA) ×2
CANNULA REDUCER 12-8 DVNC XI (CANNULA) IMPLANT
CHLORAPREP W/TINT 26 (MISCELLANEOUS) ×2 IMPLANT
COVER TIP SHEARS 8 DVNC (MISCELLANEOUS) ×1 IMPLANT
COVER TIP SHEARS 8MM DA VINCI (MISCELLANEOUS) ×2
DERMABOND ADVANCED (GAUZE/BANDAGES/DRESSINGS) ×1
DERMABOND ADVANCED .7 DNX12 (GAUZE/BANDAGES/DRESSINGS) IMPLANT
DEVICE TROCAR PUNCTURE CLOSURE (ENDOMECHANICALS) IMPLANT
DRAPE ARM DVNC X/XI (DISPOSABLE) ×4 IMPLANT
DRAPE COLUMN DVNC XI (DISPOSABLE) ×1 IMPLANT
DRAPE DA VINCI XI ARM (DISPOSABLE) ×8
DRAPE DA VINCI XI COLUMN (DISPOSABLE) ×2
ELECT REM PT RETURN 15FT ADLT (MISCELLANEOUS) ×2 IMPLANT
GAUZE 4X4 16PLY ~~LOC~~+RFID DBL (SPONGE) ×2 IMPLANT
GAUZE SPONGE 2X2 8PLY STRL LF (GAUZE/BANDAGES/DRESSINGS) IMPLANT
GLOVE BIOGEL PI IND STRL 7.0 (GLOVE) ×2 IMPLANT
GLOVE BIOGEL PI INDICATOR 7.0 (GLOVE) ×2
GLOVE SURG SS PI 7.0 STRL IVOR (GLOVE) ×4 IMPLANT
GOWN STRL REUS W/ TWL LRG LVL3 (GOWN DISPOSABLE) ×2 IMPLANT
GOWN STRL REUS W/ TWL XL LVL3 (GOWN DISPOSABLE) IMPLANT
GOWN STRL REUS W/TWL LRG LVL3 (GOWN DISPOSABLE) ×4
GOWN STRL REUS W/TWL XL LVL3 (GOWN DISPOSABLE) ×2
GRASPER SUT TROCAR 14GX15 (MISCELLANEOUS) IMPLANT
KIT BASIN OR (CUSTOM PROCEDURE TRAY) ×2 IMPLANT
KIT TURNOVER KIT A (KITS) ×1 IMPLANT
MANIFOLD NEPTUNE II (INSTRUMENTS) ×2 IMPLANT
MARKER SKIN DUAL TIP RULER LAB (MISCELLANEOUS) ×2 IMPLANT
MESH BARD SOFT 6X6IN (Mesh General) ×2 IMPLANT
NEEDLE HYPO 22GX1.5 SAFETY (NEEDLE) ×2 IMPLANT
SCISSORS LAP 5X35 DISP (ENDOMECHANICALS) IMPLANT
SEAL CANN UNIV 5-8 DVNC XI (MISCELLANEOUS) ×3 IMPLANT
SEAL XI 5MM-8MM UNIVERSAL (MISCELLANEOUS) ×6
SEALER VESSEL DA VINCI XI (MISCELLANEOUS)
SEALER VESSEL EXT DVNC XI (MISCELLANEOUS) IMPLANT
SOL ANTI FOG 6CC (MISCELLANEOUS) ×1 IMPLANT
SOLUTION ANTI FOG 6CC (MISCELLANEOUS) ×1
SPIKE FLUID TRANSFER (MISCELLANEOUS) ×2 IMPLANT
SPONGE GAUZE 2X2 STER 10/PKG (GAUZE/BANDAGES/DRESSINGS)
SUT MNCRL AB 4-0 PS2 18 (SUTURE) ×2 IMPLANT
SUT STRATAFIX 0 PDS+ CT-2 23 (SUTURE) ×4
SUT STRATAFIX SPIRAL PDS3-0 (SUTURE) ×3 IMPLANT
SUT VIC AB 3-0 SH 27 (SUTURE)
SUT VIC AB 3-0 SH 27XBRD (SUTURE) IMPLANT
SUTURE STRATFX 0 PDS+ CT-2 23 (SUTURE) ×1 IMPLANT
SYR 20ML LL LF (SYRINGE) ×4 IMPLANT
SYR CONTROL 10ML LL (SYRINGE) ×2 IMPLANT
TOWEL OR 17X26 10 PK STRL BLUE (TOWEL DISPOSABLE) ×2 IMPLANT
TOWEL OR NON WOVEN STRL DISP B (DISPOSABLE) ×2 IMPLANT
TRAY FOLEY MTR SLVR 16FR STAT (SET/KITS/TRAYS/PACK) IMPLANT
TRAY LAPAROSCOPIC (CUSTOM PROCEDURE TRAY) ×2 IMPLANT
TROCAR Z-THREAD OPTICAL 5X100M (TROCAR) ×2 IMPLANT

## 2022-03-03 NOTE — Transfer of Care (Signed)
Immediate Anesthesia Transfer of Care Note  Patient: Bruce Hunter  Procedure(s) Performed: XI ROBOT ASSISTED UMBILICAL HERNIA REPAIR WITH MESH (Abdomen)  Patient Location: PACU  Anesthesia Type:General  Level of Consciousness: awake, alert  and patient cooperative  Airway & Oxygen Therapy: Patient Spontanous Breathing and Patient connected to face mask oxygen  Post-op Assessment: Report given to RN and Post -op Vital signs reviewed and stable  Post vital signs: Reviewed and stable  Last Vitals:  Vitals Value Taken Time  BP 155/113 03/03/22 1000  Temp    Pulse 79 03/03/22 1001  Resp 14 03/03/22 1001  SpO2 100 % 03/03/22 1001  Vitals shown include unvalidated device data.  Last Pain:  Vitals:   03/03/22 0552  TempSrc:   PainSc: 7          Complications:  Encounter Notable Events  Notable Event Outcome Phase Comment  Difficult to intubate - expected  Intraprocedure Filed from anesthesia note documentation.

## 2022-03-03 NOTE — Op Note (Signed)
Preoperative diagnosis: umbilical hernia  Postoperative diagnosis: same   Procedure: robotic 3.5 cm umbilical repair with mesh  Surgeon: Gurney Maxin, M.D.  Asst: none  Anesthesia: general  Indications for procedure: Bruce Hunter is a 46 y.o. year old male with symptoms of abdominal pain. he was found to have a ventral hernia. After discussed options, decision was made to proceed with robotic minimally invasive repair with mesh.  Description of procedure: The patient was brought into the operative suite. Anesthesia was administered with General endotracheal anesthesia. WHO checklist was applied. The patient was then placed in supine position with tucked arms. The area was prepped and draped in the usual sterile fashion.  Next, a left subcostal incision was made. A 49mm trocar was used to gain access to the peritoneal cavity by optical entry technique. Pneumoperitoneum was applied with a high flow and low pressure. The laparoscope was reinserted to confirm position. Bilateral TAP blocks were placed with Marcaine/Exparel mix. 2 additional trocars were placed a 12  mm trocar in the left mid abdomen and 8 mm trocar in the right upper abdomen. The subcostal incision was up-sized to an 8 mm trocar.  The peritoneum was incised and a plane created separating the peritoneum away from the posterior rectus sheath. This was done towards and across the midline to allow the hernia to be isolated in greater than 180 degrees. The hernia sac was dissected away from the fascia and subcutaneous tissues. Care was taken to avoid skin injury. The fascial defect was 3.5 cm in diameter and closed with a running 0 symmetric Strattafix suture. A 15 x 15 cm piece of Bard Soft mesh was placed into the preperitoneal space.  The peritoneal flap was re-approximated with 3-0 Strattafix.  Pneumoperitoneum was evacuated. Trocars were removed. All incisions were closed with 4-0 monocryl subcuticular sutures. Dermabond was put  in place for dressing.  Findings: 3.5 cm umbilical hernia  Specimen: none  Implant: Bard Soft mesh   Blood loss: 20 ml  Local anesthesia: 50 ml of Marcaine/Exparel mix  Complications: none  Gurney Maxin, M.D. General, Bariatric, & Minimally Invasive Surgery [facility]

## 2022-03-03 NOTE — Anesthesia Postprocedure Evaluation (Signed)
Anesthesia Post Note  Patient: Bruce Hunter  Procedure(s) Performed: XI ROBOT ASSISTED UMBILICAL HERNIA REPAIR WITH MESH (Abdomen)     Patient location during evaluation: PACU Anesthesia Type: General Level of consciousness: awake and alert Pain management: pain level controlled Vital Signs Assessment: post-procedure vital signs reviewed and stable Respiratory status: spontaneous breathing, nonlabored ventilation, respiratory function stable and patient connected to nasal cannula oxygen Cardiovascular status: blood pressure returned to baseline and stable Postop Assessment: no apparent nausea or vomiting Anesthetic complications: yes   Encounter Notable Events  Notable Event Outcome Phase Comment  Difficult to intubate - expected  Intraprocedure Filed from anesthesia note documentation.    Last Vitals:  Vitals:   03/03/22 1147 03/03/22 1148  BP: (!) 194/119 (!) 174/109  Pulse: 76 73  Resp:  20  Temp:    SpO2: 97% 95%    Last Pain:  Vitals:   03/03/22 1148  TempSrc:   PainSc: 0-No pain                 Santa Lighter

## 2022-03-03 NOTE — Addendum Note (Signed)
Addendum  created 03/03/22 1900 by West Pugh, CRNA   Clinical Note Signed, Intraprocedure Blocks edited, SmartForm saved

## 2022-03-03 NOTE — Discharge Instructions (Signed)
[facility] _______[facility]  UMBILICAL OR INGUINAL HERNIA REPAIR: POST OP INSTRUCTIONS  Always review your discharge instruction sheet given to you by the facility where your surgery was performed. IF YOU HAVE DISABILITY OR FAMILY LEAVE FORMS, YOU MUST BRING THEM TO THE OFFICE FOR PROCESSING.   DO NOT GIVE THEM TO YOUR DOCTOR.  1. A  prescription for pain medication may be given to you upon discharge.  Take your pain medication as prescribed, if needed.  If narcotic pain medicine is not needed, then you may take acetaminophen (Tylenol) or ibuprofen (Advil) as needed. 2. Take your usually prescribed medications unless otherwise directed. If you need a refill on your pain medication, please contact your pharmacy.  They will contact our office to request authorization. Prescriptions will not be filled after 5 pm or on week-ends. 3. You should follow a light diet the first 24 hours after arrival home, such as soup and crackers, etc.  Be sure to include lots of fluids daily.  Resume your normal diet the day after surgery. 4.Most patients will experience some swelling and bruising around the umbilicus or in the groin and scrotum.  Ice packs and reclining will help.  Swelling and bruising can take several days to resolve.  6. It is common to experience some constipation if taking pain medication after surgery.  Increasing fluid intake and taking a stool softener (such as Colace) will usually help or prevent this problem from occurring.  A mild laxative (Milk of Magnesia or Miralax) should be taken according to package directions if there are no bowel movements after 48 hours. 7. Unless discharge instructions indicate otherwise, you may remove your bandages 24-48 hours after surgery, and you may shower at that time.  You may have steri-strips (small skin tapes) in place directly over the incision.  These strips should be left on the skin for 7-10 days.  If your surgeon used skin glue on the  incision, you may shower in 24 hours.  The glue will flake off over the next 2-3 weeks.  Any sutures or staples will be removed at the office during your follow-up visit. 8. ACTIVITIES:  You may resume regular (light) daily activities beginning the next day--such as daily self-care, walking, climbing stairs--gradually increasing activities as tolerated.  You may have sexual intercourse when it is comfortable.  Refrain from any heavy lifting or straining until approved by your doctor.  a.You may drive when you are no longer taking prescription pain medication, you can comfortably wear a seatbelt, and you can safely maneuver your car and apply brakes. b.RETURN TO WORK:   _____________________________________________  9.You should see your doctor in the office for a follow-up appointment approximately 2-3 weeks after your surgery.  Make sure that you call for this appointment within a day or two after you arrive home to insure a convenient appointment time. 10.OTHER INSTRUCTIONS: _________________________    _____________________________________  WHEN TO CALL YOUR DOCTOR: Fever over 101.0 Inability to urinate Nausea and/or vomiting Extreme swelling or bruising Continued bleeding from incision. Increased pain, redness, or drainage from the incision  The clinic staff is available to answer your questions during regular business hours.  Please don't hesitate to call and ask to speak to one of the nurses for clinical concerns.  If you have a medical emergency, go to the nearest emergency room or call 911.  A surgeon from [facility] is always on call at the hospital   [address], [address],   [place], [place]  [address] ?  [address] [address], [place], [place]   [address] [phone] ? [phone] ? FAX [phone] Web site: [email or web address]  

## 2022-03-03 NOTE — Anesthesia Procedure Notes (Addendum)
Procedure Name: Intubation Date/Time: 03/03/2022 7:31 AM  Performed by: West Pugh, CRNAPre-anesthesia Checklist: Patient identified, Emergency Drugs available, Suction available, Patient being monitored and Timeout performed Patient Re-evaluated:Patient Re-evaluated prior to induction Oxygen Delivery Method: Circle system utilized Preoxygenation: Pre-oxygenation with 100% oxygen Induction Type: IV induction and Rapid sequence Laryngoscope Size: Glidescope and 4 Grade View: Grade I Tube type: Oral Tube size: 7.5 mm Number of attempts: 1 Airway Equipment and Method: Rigid stylet and Video-laryngoscopy Placement Confirmation: ETT inserted through vocal cords under direct vision, positive ETCO2, CO2 detector and breath sounds checked- equal and bilateral Tube secured with: Tape Dental Injury: Teeth and Oropharynx as per pre-operative assessment  Difficulty Due To: Difficulty was anticipated and Difficult Airway- due to large tongue

## 2022-03-03 NOTE — H&P (Signed)
Chief Complaint: Umbilical hernia  History of Present Illness: Bruce Hunter is a 46 y.o. male who is seen today as an office consultation for evaluation of umbilical hernia .   46 yo male who has noticed an umbilical bulge for the last 4 months. It causes occasional discomfort. He denies nausea, vomiting, or constipation. He is able to do all activities.  He smoke 1-2 cigarettes a day He has diabetes He was treated for HIV and viral count has been 0  Review of Systems: A complete review of systems was obtained from the patient. I have reviewed this information and discussed as appropriate with the patient. See HPI as well for other ROS.  Review of Systems  Constitutional: Negative.  HENT: Negative.  Eyes: Negative.  Respiratory: Negative.  Cardiovascular: Negative.  Gastrointestinal: Negative.  Genitourinary: Negative.  Musculoskeletal: Negative.  Skin: Negative.  Neurological: Negative.  Endo/Heme/Allergies: Negative.  Psychiatric/Behavioral: Negative.    Medical History: Past Medical History:  Diagnosis Date   Anemia   Anxiety   Arthritis   Asthma, unspecified asthma severity, unspecified whether complicated, unspecified whether persistent   CHF (congestive heart failure) (CMS-HCC)   Chronic kidney disease   COPD (chronic obstructive pulmonary disease) (CMS-HCC)   Diabetes mellitus without complication (CMS-HCC)   Hypertension   There is no problem list on file for this patient.  Past Surgical History:  Procedure Laterality Date   Excision Tophus Right Foot 02/26/2020  Dr. Norton Blizzard    Allergies  Allergen Reactions   Lactose Nausea And Vomiting   Current Outpatient Medications on File Prior to Visit  Medication Sig Dispense Refill   cyanocobalamin (VITAMIN B12) 1000 MCG tablet Take 1,000 mcg by mouth once daily   gabapentin (NEURONTIN) 600 MG tablet Take 600 mg by mouth 3 (three) times daily   labetaloL (TRANDATE) 100 MG tablet Take 100 mg by mouth 2 (two)  times daily   magnesium oxide (MAG-OX) 400 mg (241.3 mg magnesium) tablet Take 400 mg by mouth 2 (two) times daily   methocarbamoL (ROBAXIN) 500 MG tablet Take by mouth   oxyCODONE (DAZIDOX) 10 mg immediate release tablet Take by mouth   aspirin 81 MG EC tablet   atorvastatin (LIPITOR) 10 MG tablet   budesonide-formoteroL (SYMBICORT) 160-4.5 mcg/actuation inhaler   clonazePAM (KLONOPIN) 0.5 MG tablet   colchicine (COLCRYS) 0.6 mg tablet   famotidine (PEPCID) 40 MG tablet   FARXIGA 10 mg tablet   ferrous sulfate 325 (65 FE) MG EC tablet   levocetirizine (XYZAL) 5 MG tablet   omeprazole (PRILOSEC) 20 MG DR capsule   tamsulosin (FLOMAX) 0.4 mg capsule   traZODone (DESYREL) 50 MG tablet Take 50 mg by mouth at bedtime   No current facility-administered medications on file prior to visit.   Family History  Problem Relation Age of Onset   Obesity Mother   Hyperlipidemia (Elevated cholesterol) Mother   High blood pressure (Hypertension) Mother   Diabetes Mother   Breast cancer Mother    Social History   Tobacco Use  Smoking Status Every Day   Packs/day: 1.00   Types: Cigarettes  Smokeless Tobacco Never    Social History   Socioeconomic History   Marital status: Single  Tobacco Use   Smoking status: Every Day  Packs/day: 1.00  Types: Cigarettes   Smokeless tobacco: Never  Substance and Sexual Activity   Alcohol use: Never   Drug use: Yes  Comment: Marijuana   Objective:   Vitals:  12/23/21 1359  BP: (!) 152/90  Pulse: 93  Temp: 36.6 C (97.9 F)  SpO2: 97%  Weight: (!) 124.1 kg (273 lb 9.6 oz)  Height: 177.8 cm (5\' 10" )   Body mass index is 39.26 kg/m.  Physical Exam Constitutional:  Appearance: Normal appearance.  HENT:  Head: Normocephalic and atraumatic.  Pulmonary:  Effort: Pulmonary effort is normal.  Abdominal:  Comments: Moderate umbilical hernia, 3 cm defect  Musculoskeletal:  General: Normal range of motion.  Cervical back: Normal range of  motion.  Neurological:  General: No focal deficit present.  Mental Status: He is alert and oriented to person, place, and time. Mental status is at baseline.  Psychiatric:  Mood and Affect: Mood normal.  Behavior: Behavior normal.  Thought Content: Thought content normal.    Labs, Imaging and Diagnostic Testing: I reviewed notes from Dr. Jonelle Sidle and Dr. Bonnell Public  Assessment and Plan:   Diagnoses and all orders for this visit:  Tobacco use  Chronic systolic congestive heart failure (CMS-HCC)  Umbilical hernia without obstruction or gangrene  Type 2 diabetes mellitus with stage 4 chronic kidney disease, without long-term current use of insulin (CMS-HCC)    The patient has a symptomatic reducible hernia. We discussed the etiology of his hernia, the risk of it enlarging, incarceration, obstruction, strangulation, and that it is unlikely to get smaller or better on its own. We discussed operative options of laparoscopic vs open repair with mesh including the risks of recurrence, injury to intestines or abdominal organs, or chronic pain associated with mesh. We decided to proceed with MIS umbilical hernia with preperitoneal mesh.

## 2022-03-15 ENCOUNTER — Other Ambulatory Visit (HOSPITAL_COMMUNITY): Payer: Self-pay

## 2022-03-15 ENCOUNTER — Other Ambulatory Visit: Payer: Self-pay | Admitting: Physical Medicine and Rehabilitation

## 2022-03-15 MED ORDER — MAGNESIUM OXIDE -MG SUPPLEMENT 400 (240 MG) MG PO TABS
400.0000 mg | ORAL_TABLET | Freq: Two times a day (BID) | ORAL | 0 refills | Status: AC
  Filled 2022-03-15: qty 60, 30d supply, fill #0

## 2022-03-19 ENCOUNTER — Emergency Department (HOSPITAL_COMMUNITY)
Admission: EM | Admit: 2022-03-19 | Discharge: 2022-04-05 | Disposition: E | Payer: Medicaid Other | Attending: Emergency Medicine | Admitting: Emergency Medicine

## 2022-03-19 DIAGNOSIS — Z7982 Long term (current) use of aspirin: Secondary | ICD-10-CM | POA: Diagnosis not present

## 2022-03-19 DIAGNOSIS — I469 Cardiac arrest, cause unspecified: Secondary | ICD-10-CM

## 2022-03-19 DIAGNOSIS — R7309 Other abnormal glucose: Secondary | ICD-10-CM | POA: Insufficient documentation

## 2022-03-19 DIAGNOSIS — G529 Cranial nerve disorder, unspecified: Secondary | ICD-10-CM | POA: Insufficient documentation

## 2022-03-19 LAB — CBG MONITORING, ED: Glucose-Capillary: 108 mg/dL — ABNORMAL HIGH (ref 70–99)

## 2022-03-19 MED ORDER — EPINEPHRINE 1 MG/10ML IJ SOSY
PREFILLED_SYRINGE | INTRAMUSCULAR | Status: AC | PRN
  Administered 2022-03-19 (×3): 1 mg via INTRAVENOUS

## 2022-03-19 MED ORDER — EPINEPHRINE HCL 5 MG/250ML IV SOLN IN NS
0.5000 ug/min | INTRAVENOUS | Status: DC
  Administered 2022-03-19: 20 ug/min via INTRAVENOUS

## 2022-03-22 ENCOUNTER — Other Ambulatory Visit (HOSPITAL_COMMUNITY): Payer: Self-pay

## 2022-04-05 ENCOUNTER — Ambulatory Visit: Payer: Medicaid Other | Admitting: Podiatry

## 2022-04-05 NOTE — Progress Notes (Signed)
   April 08, 2022 1600  Clinical Encounter Type  Visited With Family  Visit Type Initial;ED;Social support;Psychological support;Spiritual support  Referral From Family;Nurse  Stress Factors  Family Stress Factors Loss   [facility] paged to support family at bedside in the wake of pt. passing this afternoon; when [facility] arrived pt.'s brother, mother, and sister in law at bedside; mother shared pt. just underwent surgery for a hernia repair 2wks ago; brother says pt. was found unresponsive at his home today; family requested prayer.  Chaplains remain available via page for further support as needed.

## 2022-04-05 NOTE — ED Triage Notes (Signed)
Pt BIB [facility] for eval of cardiac arrest. Pt was found by a friend down on the floor of his home. Family states pt had been feeling poorly the last few days. EMS reports initial rhythm was asystole. EMS admin 4 Epi, no shocks, report intermittent ROSC throughout transport. When ROSC obtained, pt would maintain ST w/ rate in 130s-140s then brady back down to asystole. Pt arrives to ED CPR in progress.

## 2022-04-05 NOTE — ED Provider Notes (Addendum)
[facility] EMERGENCY DEPARTMENT Provider Note   CSN: [id number] Arrival date & time: 03-22-2022  1550     History  Chief Complaint  Patient presents with   Cardiac Arrest    Bruce Hunter is a 46 y.o. male.  Patient presents after unwitnessed cardiac arrest.  Patient last spoken with by mother yesterday and is feeling generally unwell at that time.  No updates on patient's symptoms since a friend found him unresponsive and pulseless 1.5 hours prior to arrival.  EMS brought the patient in as they were able to regain pulses for a few minutes and sinus tachycardia otherwise patient was PEA or no pulse.  Point-of-care glucose in the field was normal.  No evidence of drug use on scene.       Home Medications Prior to Admission medications   Medication Sig Start Date End Date Taking? Authorizing Provider  magnesium oxide (MAGOX 400) 400 (240 Mg) MG tablet Take 1 tablet (400 mg total) by mouth 2 (two) times daily. 03/15/22   Raulkar, Clide Deutscher, MD  acetaminophen (TYLENOL) 650 MG CR tablet Take 1,300 mg by mouth daily as needed for pain.    [provider]  aspirin EC 81 MG EC tablet Take 1 tablet (81 mg total) by mouth daily. Swallow whole. Patient taking differently: Take 81 mg by mouth every evening. Swallow whole. 07/05/21   Antonieta Pert, MD  atorvastatin (LIPITOR) 10 MG tablet Take 10 mg by mouth every evening. 12/12/21   [provider]  budesonide-formoterol (SYMBICORT) 160-4.5 MCG/ACT inhaler Inhale 1 puff into the lungs every morning.    [provider]  clonazePAM (KLONOPIN) 1 MG tablet Take 1 mg by mouth 3 (three) times daily. 01/27/22   [provider]  colchicine 0.6 MG tablet For an acute gout attack take two tablets and then another one tablet an hour later then resume one tablet daily for prevention Patient taking differently: Take 0.6-1.2 mg by mouth See admin instructions. Take one tablet (0.2 mg) by mouth every evening for  prevention. For an acute gout attack take two tablets (1.2 mg) and then another one tablet (0.6 mg) an hour later 10/20/21   Tommy Medal, Lavell Islam, MD  cyanocobalamin 1000 MCG tablet Take 1 tablet by mouth daily. 02/22/22   Raulkar, Clide Deutscher, MD  dapagliflozin propanediol (FARXIGA) 10 MG TABS tablet Take 10 mg by mouth every morning.    [provider]  dolutegravir-lamiVUDine (DOVATO) 50-300 MG tablet Take 1 tablet by mouth daily. Patient taking differently: Take 1 tablet by mouth every morning. 01/19/22 01/14/23  Truman Hayward, MD  famotidine (PEPCID) 40 MG tablet Take 40 mg by mouth every evening. 12/22/21   [provider]  ferrous sulfate 325 (65 FE) MG EC tablet 325 mg every evening. 06/03/21   [provider]  fluticasone furoate-vilanterol (BREO ELLIPTA) 100-25 MCG/ACT AEPB Inhale 1 puff into the lungs every morning. 10/08/21   [provider]  gabapentin (NEURONTIN) 600 MG tablet Take 1 tablet (600 mg total) by mouth 3 (three) times daily. 11/01/21   Raulkar, Clide Deutscher, MD  insulin glargine (LANTUS SOLOSTAR) 100 UNIT/ML Solostar Pen Inject 5 Units into the skin daily. Patient taking differently: Inject 5 Units into the skin every evening. 05/03/21   Love, Ivan Anchors, PA-C  Insulin Pen Needle 32G X 4 MM MISC use as directed 05/03/21   Love, Ivan Anchors, PA-C  ipratropium-albuterol (DUONEB) 0.5-2.5 (3) MG/3ML SOLN Take 3 mLs by nebulization  3 (three) times daily. 01/18/22   [provider]  labetalol (NORMODYNE) 100 MG tablet Take 100 mg by mouth 3 (three) times daily.    [provider]  levocetirizine (XYZAL) 5 MG tablet Take 5 mg by mouth daily as needed for allergies. 12/22/21   [provider]  methocarbamol (ROBAXIN) 500 MG tablet Take 1 tablet (500 mg total) by mouth every 6 (six) hours as needed for muscle spasms. Patient taking differently: Take 500 mg by mouth 3 (three) times daily. 05/03/21   Love, Ivan Anchors, PA-C  Multiple Vitamin  (MULTIVITAMIN WITH MINERALS) TABS tablet Take 1 tablet by mouth every morning.    [provider]  olmesartan (BENICAR) 20 MG tablet Take 20 mg by mouth every morning. 12/22/21   [provider]  omeprazole (PRILOSEC) 20 MG capsule Take 20 mg by mouth every evening. 09/22/21   [provider]  Oxycodone HCl 10 MG TABS Take 1 tablet (10 mg total) by mouth 5 (five) times daily as needed (severe pain). 03/03/22   Kinsinger, Arta Bruce, MD  predniSONE (DELTASONE) 10 MG tablet Take 30 mg by mouth daily with breakfast.    [provider]  tamsulosin (FLOMAX) 0.4 MG CAPS capsule Take 1 capsule (0.4 mg total) by mouth daily after supper. 05/03/21   Love, Ivan Anchors, PA-C  traZODone (DESYREL) 50 MG tablet Take 50 mg by mouth at bedtime as needed for sleep.    [provider]      Allergies    Lactose intolerance (gi)    Review of Systems   Review of Systems  Unable to perform ROS: Patient unresponsive    Physical Exam Updated Vital Signs BP 130/66   Pulse (!) 115   Resp 17   Ht 5\' 10"  (1.778 m)   Wt 113 kg   SpO2 (!) 79%   BMI 35.74 kg/m  Physical Exam Vitals and nursing note reviewed.  Constitutional:      Appearance: He is well-developed. He is ill-appearing.  HENT:     Head: Normocephalic and atraumatic.     Mouth/Throat:     Mouth: Mucous membranes are dry.  Eyes:     General:        Right eye: No discharge.        Left eye: No discharge.  Neck:     Trachea: No tracheal deviation.  Cardiovascular:     Comments: Majority of resuscitation patient has no pulse, briefly had weak pulses for approximately few minutes at a time. Pulmonary:     Comments: No spontaneous respirations Abdominal:     General: There is no distension.     Palpations: Abdomen is soft.     Tenderness: There is no abdominal tenderness. There is no guarding.  Musculoskeletal:        General: No deformity.     Cervical back: Neck supple. No rigidity.  Skin:     Capillary Refill: Capillary refill takes more than 3 seconds.     Comments: Patient has few areas of bruising mid abdominal region.  Skin cool  Neurological:     Cranial Nerves: Cranial nerve deficit present.     Comments: Pupils equal and unresponsive to light, no spontaneous movement, no spontaneous breathing, unresponsive to pain.  Psychiatric:     Comments: Patient unresponsive, pulseless     ED Results / Procedures / Treatments   Labs (all labs ordered are listed, but only abnormal results are displayed) Labs Reviewed  CBG MONITORING, ED -  Abnormal; Notable for the following components:      Result Value   Glucose-Capillary 108 (*)    All other components within normal limits    EKG None  Radiology No results found.  Procedures .Critical Care  Performed by: Elnora Morrison, MD Authorized by: Elnora Morrison, MD   Critical care provider statement:    Critical care time (minutes):  30   Critical care start time:  04-09-22 4:00 PM   Critical care end time:  2022-04-09 4:30 PM   Critical care time was exclusive of:  Separately billable procedures and treating other patients and teaching time   Critical care was necessary to treat or prevent imminent or life-threatening deterioration of the following conditions:  Cardiac failure   Critical care was time spent personally by me on the following activities:  Development of treatment plan with patient or surrogate, discussions with consultants, evaluation of patient's response to treatment, examination of patient, ordering and performing treatments and interventions, pulse oximetry, re-evaluation of patient's condition and review of old charts Procedure Name: Intubation Date/Time: 2022-04-09 5:42 PM  Performed by: Elnora Morrison, MDPre-anesthesia Checklist: Patient identified Oxygen Delivery Method: Ambu bag Preoxygenation: Pre-oxygenation with 100% oxygen Ventilation: Mask ventilation without difficulty Laryngoscope Size:  Glidescope and 3 Grade View: Grade II Tube size: 8.0 mm Number of attempts: 1 Placement Confirmation: CO2 detector, ETT inserted through vocal cords under direct vision, Breath sounds checked- equal and bilateral and Positive ETCO2 Tube secured with: Tape Comments: Post arrest    Ultrasound ED Echo  Date/Time: April 09, 2022 5:43 PM  Performed by: Elnora Morrison, MD Authorized by: Elnora Morrison, MD   Procedure details:    Indications: cardiac arrest     Views: subxiphoid     Images: archived   Findings:    Pericardium: no pericardial effusion     Cardiac Activity: no cardiac activity     LV Function: severly depressed (<30%)   Impression:    Impression: decreased contractility   CPR  Date/Time: 2022/04/09 5:48 PM  Performed by: Elnora Morrison, MD Authorized by: Elnora Morrison, MD  CPR Procedure Details:    ACLS/BLS initiated by EMS: Yes     CPR/ACLS performed in the ED: Yes     Duration of CPR (minutes):  10   Outcome: Pt declared dead    CPR performed via ACLS guidelines under my direct supervision.  See RN documentation for details including defibrillator use, medications, doses and timing.     Medications Ordered in ED Medications  EPINEPHrine (ADRENALIN) 5 mg in NS 250 mL (0.02 mg/mL) premix infusion (0 mcg/min Intravenous Stopped 04-09-22 1613)  EPINEPHrine (ADRENALIN) 1 MG/10ML injection (1 mg Intravenous Given 2022-04-09 1601)    ED Course/ Medical Decision Making/ A&P                           Medical Decision Making Risk Prescription drug management.   Patient presents with EMS after unwitnessed cardiac arrest and prolonged downtime last spoken with yesterday.  Unknown details of event except that patient was not feeling well yesterday per report.  Patient had approximately an hour and a half of CPR with a few episodes of sinus tachycardia prior to arrival.  On arrival patient not have pulse, I supervised CPR and epinephrine dosing while discussing and  considering different differentials.  No family initially to clarify details so resuscitation continued.  On pulse check patient had no significant ejection fraction and was PEA.  Other pulse  check showed PEA and then patient did have brief pulse return with sinus tachycardia.  EKG reviewed no signs of STEMI.  The epinephrine seem to continue to provide intermittent heart rate improvement.  On pulse checks patient has no signs of life no response to pain no spontaneous breathing.  Patient had King airway intubated in the field, when patient got sinus tachycardia back I did intubate with 8-0 with respiratory's assistance and glide scope.  ET tube.  Discussed with him on the phone and then in person outside the room discussing the severity and acuity of presentation and unknowns with prolonged downtime.  They discussed patient has complex medical history and has had admission for similar in the past.  Discussed that the point-of-care glucose was normal however patient's heart was extremely weak and not able to squeeze well enough to provide adequate perfusion to the brain so outcome not great.  Patient's mother and family member in discussion and we agreed that if patient lost a pulse again we would hold CPR and keep the patient comfortable.  Soon after I went back in the room patient did lose a pulse again.  I discussed the case with medical examiner and given complex medical history did not recommend autopsy and recommend primary care doctor fill out death certificate.  Discussed with primary care doctor Dr. Jonelle Sidle in detail who will follow up does certificate and is aware.  Updated nursing.  Time of death 4:12.      Final Clinical Impression(s) / ED Diagnoses Final diagnoses:  Cardiac arrest [facility])    Rx / DC Orders ED Discharge Orders     None         Elnora Morrison, MD 2022-04-10 4076    Elnora Morrison, MD 10-Apr-2022 1749

## 2022-04-05 DEATH — deceased

## 2022-04-20 ENCOUNTER — Ambulatory Visit: Payer: Medicaid Other | Admitting: Internal Medicine

## 2022-05-25 ENCOUNTER — Ambulatory Visit: Payer: Medicaid Other | Admitting: Infectious Disease

## 2022-06-03 ENCOUNTER — Ambulatory Visit: Payer: Medicaid Other | Admitting: Interventional Cardiology

## 2022-07-11 ENCOUNTER — Ambulatory Visit: Payer: Medicaid Other | Admitting: Physical Medicine and Rehabilitation

## 2022-07-15 LAB — PROINSULIN/INSULIN RATIO
Insulin: 5.4 u[IU]/mL
Proinsulin: 4.1 pmol/L

## 2022-08-08 ENCOUNTER — Ambulatory Visit: Payer: Medicaid Other | Admitting: Physical Medicine and Rehabilitation

## 2022-10-17 ENCOUNTER — Ambulatory Visit: Payer: Medicaid Other | Admitting: Physical Medicine and Rehabilitation
